# Patient Record
Sex: Female | Born: 1955
Health system: Southern US, Community
[De-identification: ages and names within clinical notes are randomized; demographics above are authoritative.]

## PROBLEM LIST (undated history)

## (undated) DIAGNOSIS — R740 Nonspecific elevation of levels of transaminase and lactic acid dehydrogenase [LDH]: Secondary | ICD-10-CM

## (undated) DIAGNOSIS — R011 Cardiac murmur, unspecified: Secondary | ICD-10-CM

## (undated) DIAGNOSIS — M51369 Other intervertebral disc degeneration, lumbar region without mention of lumbar back pain or lower extremity pain: Secondary | ICD-10-CM

## (undated) DIAGNOSIS — M5136 Other intervertebral disc degeneration, lumbar region: Secondary | ICD-10-CM

## (undated) DIAGNOSIS — J309 Allergic rhinitis, unspecified: Secondary | ICD-10-CM

## (undated) DIAGNOSIS — H409 Unspecified glaucoma: Secondary | ICD-10-CM

## (undated) DIAGNOSIS — G8929 Other chronic pain: Secondary | ICD-10-CM

## (undated) DIAGNOSIS — E119 Type 2 diabetes mellitus without complications: Secondary | ICD-10-CM

## (undated) DIAGNOSIS — K219 Gastro-esophageal reflux disease without esophagitis: Secondary | ICD-10-CM

## (undated) DIAGNOSIS — T7840XA Allergy, unspecified, initial encounter: Secondary | ICD-10-CM

## (undated) DIAGNOSIS — F71 Moderate intellectual disabilities: Secondary | ICD-10-CM

## (undated) DIAGNOSIS — R7401 Elevation of levels of liver transaminase levels: Secondary | ICD-10-CM

## (undated) DIAGNOSIS — K589 Irritable bowel syndrome without diarrhea: Secondary | ICD-10-CM

## (undated) DIAGNOSIS — D649 Anemia, unspecified: Secondary | ICD-10-CM

## (undated) DIAGNOSIS — M19079 Primary osteoarthritis, unspecified ankle and foot: Secondary | ICD-10-CM

## (undated) DIAGNOSIS — M545 Low back pain, unspecified: Secondary | ICD-10-CM

## (undated) DIAGNOSIS — D320 Benign neoplasm of cerebral meninges: Secondary | ICD-10-CM

## (undated) DIAGNOSIS — E669 Obesity, unspecified: Secondary | ICD-10-CM

## (undated) DIAGNOSIS — D869 Sarcoidosis, unspecified: Secondary | ICD-10-CM

## (undated) DIAGNOSIS — Z8781 Personal history of (healed) traumatic fracture: Secondary | ICD-10-CM

## (undated) DIAGNOSIS — I10 Essential (primary) hypertension: Secondary | ICD-10-CM

## (undated) DIAGNOSIS — D329 Benign neoplasm of meninges, unspecified: Secondary | ICD-10-CM

## (undated) DIAGNOSIS — E785 Hyperlipidemia, unspecified: Secondary | ICD-10-CM

## (undated) HISTORY — DX: Unspecified glaucoma: H40.9

## (undated) HISTORY — DX: Anemia, unspecified: D64.9

## (undated) HISTORY — DX: Sarcoidosis, unspecified: D86.9

## (undated) HISTORY — DX: Cardiac murmur, unspecified: R01.1

## (undated) HISTORY — DX: Allergy, unspecified, initial encounter: T78.40XA

## (undated) HISTORY — PX: CATARACT EXTRACTION: SUR2

## (undated) HISTORY — DX: Elevation of levels of liver transaminase levels: R74.01

## (undated) HISTORY — DX: Moderate intellectual disabilities: F71

## (undated) HISTORY — DX: Essential (primary) hypertension: I10

## (undated) HISTORY — DX: Other intervertebral disc degeneration, lumbar region without mention of lumbar back pain or lower extremity pain: M51.369

## (undated) HISTORY — DX: Other chronic pain: G89.29

## (undated) HISTORY — DX: Other intervertebral disc degeneration, lumbar region: M51.36

## (undated) HISTORY — DX: Low back pain: M54.5

## (undated) HISTORY — DX: Allergic rhinitis, unspecified: J30.9

## (undated) HISTORY — PX: ELBOW SURGERY: SHX618

## (undated) HISTORY — DX: Nonspecific elevation of levels of transaminase and lactic acid dehydrogenase (ldh): R74.0

## (undated) HISTORY — DX: Benign neoplasm of meninges, unspecified: D32.9

## (undated) HISTORY — DX: Low back pain, unspecified: M54.50

## (undated) HISTORY — DX: Obesity, unspecified: E66.9

## (undated) HISTORY — DX: Primary osteoarthritis, unspecified ankle and foot: M19.079

## (undated) HISTORY — DX: Hyperlipidemia, unspecified: E78.5

## (undated) HISTORY — DX: Benign neoplasm of cerebral meninges: D32.0

## (undated) HISTORY — DX: Personal history of (healed) traumatic fracture: Z87.81

## (undated) HISTORY — DX: Gastro-esophageal reflux disease without esophagitis: K21.9

## (undated) HISTORY — DX: Irritable bowel syndrome, unspecified: K58.9

## (undated) HISTORY — DX: Type 2 diabetes mellitus without complications: E11.9

---

## 1992-03-05 HISTORY — PX: TOTAL ABDOMINAL HYSTERECTOMY: SHX209

## 1997-07-24 ENCOUNTER — Emergency Department (HOSPITAL_COMMUNITY): Admission: EM | Admit: 1997-07-24 | Discharge: 1997-07-24 | Payer: Self-pay | Admitting: Emergency Medicine

## 1997-08-15 ENCOUNTER — Emergency Department (HOSPITAL_COMMUNITY): Admission: EM | Admit: 1997-08-15 | Discharge: 1997-08-15 | Payer: Self-pay | Admitting: Emergency Medicine

## 1997-09-09 ENCOUNTER — Emergency Department (HOSPITAL_COMMUNITY): Admission: EM | Admit: 1997-09-09 | Discharge: 1997-09-09 | Payer: Self-pay | Admitting: Emergency Medicine

## 1998-10-07 ENCOUNTER — Emergency Department (HOSPITAL_COMMUNITY): Admission: EM | Admit: 1998-10-07 | Discharge: 1998-10-07 | Payer: Self-pay | Admitting: *Deleted

## 1998-11-22 ENCOUNTER — Other Ambulatory Visit: Admission: RE | Admit: 1998-11-22 | Discharge: 1998-11-22 | Payer: Self-pay | Admitting: Family Medicine

## 1998-12-25 ENCOUNTER — Emergency Department (HOSPITAL_COMMUNITY): Admission: EM | Admit: 1998-12-25 | Discharge: 1998-12-25 | Payer: Self-pay | Admitting: Emergency Medicine

## 1999-03-28 ENCOUNTER — Emergency Department (HOSPITAL_COMMUNITY): Admission: EM | Admit: 1999-03-28 | Discharge: 1999-03-28 | Payer: Self-pay | Admitting: Emergency Medicine

## 1999-06-20 ENCOUNTER — Emergency Department (HOSPITAL_COMMUNITY): Admission: EM | Admit: 1999-06-20 | Discharge: 1999-06-20 | Payer: Self-pay | Admitting: Emergency Medicine

## 1999-07-21 ENCOUNTER — Emergency Department (HOSPITAL_COMMUNITY): Admission: EM | Admit: 1999-07-21 | Discharge: 1999-07-21 | Payer: Self-pay | Admitting: Emergency Medicine

## 1999-08-07 ENCOUNTER — Emergency Department (HOSPITAL_COMMUNITY): Admission: EM | Admit: 1999-08-07 | Discharge: 1999-08-07 | Payer: Self-pay | Admitting: Emergency Medicine

## 1999-10-13 ENCOUNTER — Encounter: Payer: Self-pay | Admitting: Emergency Medicine

## 1999-10-13 ENCOUNTER — Emergency Department (HOSPITAL_COMMUNITY): Admission: EM | Admit: 1999-10-13 | Discharge: 1999-10-13 | Payer: Self-pay | Admitting: Emergency Medicine

## 1999-11-23 ENCOUNTER — Emergency Department (HOSPITAL_COMMUNITY): Admission: EM | Admit: 1999-11-23 | Discharge: 1999-11-24 | Payer: Self-pay | Admitting: Emergency Medicine

## 2000-03-08 ENCOUNTER — Emergency Department (HOSPITAL_COMMUNITY): Admission: EM | Admit: 2000-03-08 | Discharge: 2000-03-08 | Payer: Self-pay | Admitting: Emergency Medicine

## 2000-08-17 ENCOUNTER — Emergency Department (HOSPITAL_COMMUNITY): Admission: EM | Admit: 2000-08-17 | Discharge: 2000-08-17 | Payer: Self-pay | Admitting: Emergency Medicine

## 2000-12-29 ENCOUNTER — Other Ambulatory Visit: Admission: RE | Admit: 2000-12-29 | Discharge: 2000-12-29 | Payer: Self-pay | Admitting: Obstetrics and Gynecology

## 2001-03-16 ENCOUNTER — Encounter: Admission: RE | Admit: 2001-03-16 | Discharge: 2001-03-16 | Payer: Self-pay | Admitting: Family Medicine

## 2001-03-16 ENCOUNTER — Encounter: Payer: Self-pay | Admitting: Family Medicine

## 2001-03-24 ENCOUNTER — Inpatient Hospital Stay (HOSPITAL_COMMUNITY): Admission: RE | Admit: 2001-03-24 | Discharge: 2001-03-25 | Payer: Self-pay | Admitting: Orthopaedic Surgery

## 2001-04-01 ENCOUNTER — Encounter: Admission: RE | Admit: 2001-04-01 | Discharge: 2001-04-01 | Payer: Self-pay | Admitting: Orthopaedic Surgery

## 2001-06-27 ENCOUNTER — Emergency Department (HOSPITAL_COMMUNITY): Admission: EM | Admit: 2001-06-27 | Discharge: 2001-06-27 | Payer: Self-pay | Admitting: Emergency Medicine

## 2001-07-10 ENCOUNTER — Emergency Department (HOSPITAL_COMMUNITY): Admission: EM | Admit: 2001-07-10 | Discharge: 2001-07-10 | Payer: Self-pay | Admitting: Emergency Medicine

## 2001-07-17 ENCOUNTER — Emergency Department (HOSPITAL_COMMUNITY): Admission: EM | Admit: 2001-07-17 | Discharge: 2001-07-17 | Payer: Self-pay | Admitting: *Deleted

## 2001-07-23 ENCOUNTER — Emergency Department (HOSPITAL_COMMUNITY): Admission: EM | Admit: 2001-07-23 | Discharge: 2001-07-23 | Payer: Self-pay | Admitting: Emergency Medicine

## 2001-07-23 ENCOUNTER — Encounter: Payer: Self-pay | Admitting: Emergency Medicine

## 2001-09-10 ENCOUNTER — Encounter: Admission: RE | Admit: 2001-09-10 | Discharge: 2001-09-10 | Payer: Self-pay | Admitting: Critical Care Medicine

## 2001-09-10 ENCOUNTER — Encounter: Payer: Self-pay | Admitting: Critical Care Medicine

## 2001-09-15 ENCOUNTER — Emergency Department (HOSPITAL_COMMUNITY): Admission: EM | Admit: 2001-09-15 | Discharge: 2001-09-15 | Payer: Self-pay | Admitting: Emergency Medicine

## 2001-09-16 ENCOUNTER — Emergency Department (HOSPITAL_COMMUNITY): Admission: EM | Admit: 2001-09-16 | Discharge: 2001-09-16 | Payer: Self-pay | Admitting: Emergency Medicine

## 2001-09-17 ENCOUNTER — Emergency Department (HOSPITAL_COMMUNITY): Admission: EM | Admit: 2001-09-17 | Discharge: 2001-09-17 | Payer: Self-pay | Admitting: *Deleted

## 2001-09-18 ENCOUNTER — Emergency Department (HOSPITAL_COMMUNITY): Admission: EM | Admit: 2001-09-18 | Discharge: 2001-09-18 | Payer: Self-pay | Admitting: Emergency Medicine

## 2001-11-11 ENCOUNTER — Emergency Department (HOSPITAL_COMMUNITY): Admission: EM | Admit: 2001-11-11 | Discharge: 2001-11-11 | Payer: Self-pay | Admitting: Emergency Medicine

## 2001-11-23 ENCOUNTER — Emergency Department (HOSPITAL_COMMUNITY): Admission: EM | Admit: 2001-11-23 | Discharge: 2001-11-23 | Payer: Self-pay | Admitting: Emergency Medicine

## 2001-12-20 DIAGNOSIS — D320 Benign neoplasm of cerebral meninges: Secondary | ICD-10-CM

## 2001-12-20 HISTORY — DX: Benign neoplasm of cerebral meninges: D32.0

## 2001-12-27 ENCOUNTER — Emergency Department (HOSPITAL_COMMUNITY): Admission: EM | Admit: 2001-12-27 | Discharge: 2001-12-27 | Payer: Self-pay | Admitting: Emergency Medicine

## 2001-12-27 ENCOUNTER — Encounter: Payer: Self-pay | Admitting: Emergency Medicine

## 2002-02-23 ENCOUNTER — Emergency Department (HOSPITAL_COMMUNITY): Admission: EM | Admit: 2002-02-23 | Discharge: 2002-02-24 | Payer: Self-pay | Admitting: Emergency Medicine

## 2002-02-24 ENCOUNTER — Emergency Department (HOSPITAL_COMMUNITY): Admission: EM | Admit: 2002-02-24 | Discharge: 2002-02-25 | Payer: Self-pay | Admitting: Emergency Medicine

## 2002-02-25 ENCOUNTER — Encounter: Payer: Self-pay | Admitting: Emergency Medicine

## 2002-03-10 ENCOUNTER — Other Ambulatory Visit: Admission: RE | Admit: 2002-03-10 | Discharge: 2002-03-10 | Payer: Self-pay | Admitting: Family Medicine

## 2002-05-01 ENCOUNTER — Emergency Department (HOSPITAL_COMMUNITY): Admission: EM | Admit: 2002-05-01 | Discharge: 2002-05-01 | Payer: Self-pay | Admitting: Emergency Medicine

## 2002-05-06 ENCOUNTER — Ambulatory Visit (HOSPITAL_COMMUNITY): Admission: RE | Admit: 2002-05-06 | Discharge: 2002-05-06 | Payer: Self-pay | Admitting: Family Medicine

## 2002-05-27 ENCOUNTER — Emergency Department (HOSPITAL_COMMUNITY): Admission: EM | Admit: 2002-05-27 | Discharge: 2002-05-27 | Payer: Self-pay | Admitting: Emergency Medicine

## 2002-06-21 ENCOUNTER — Emergency Department (HOSPITAL_COMMUNITY): Admission: EM | Admit: 2002-06-21 | Discharge: 2002-06-21 | Payer: Self-pay | Admitting: *Deleted

## 2002-07-02 ENCOUNTER — Emergency Department (HOSPITAL_COMMUNITY): Admission: EM | Admit: 2002-07-02 | Discharge: 2002-07-02 | Payer: Self-pay | Admitting: Emergency Medicine

## 2002-07-04 ENCOUNTER — Emergency Department (HOSPITAL_COMMUNITY): Admission: EM | Admit: 2002-07-04 | Discharge: 2002-07-04 | Payer: Self-pay | Admitting: Emergency Medicine

## 2002-07-15 ENCOUNTER — Emergency Department (HOSPITAL_COMMUNITY): Admission: EM | Admit: 2002-07-15 | Discharge: 2002-07-16 | Payer: Self-pay | Admitting: Emergency Medicine

## 2002-08-21 ENCOUNTER — Emergency Department (HOSPITAL_COMMUNITY): Admission: EM | Admit: 2002-08-21 | Discharge: 2002-08-21 | Payer: Self-pay | Admitting: Emergency Medicine

## 2002-08-31 ENCOUNTER — Encounter: Payer: Self-pay | Admitting: Family Medicine

## 2002-08-31 ENCOUNTER — Encounter: Admission: RE | Admit: 2002-08-31 | Discharge: 2002-08-31 | Payer: Self-pay | Admitting: Family Medicine

## 2002-09-03 ENCOUNTER — Emergency Department (HOSPITAL_COMMUNITY): Admission: EM | Admit: 2002-09-03 | Discharge: 2002-09-03 | Payer: Self-pay | Admitting: Emergency Medicine

## 2002-12-17 ENCOUNTER — Emergency Department (HOSPITAL_COMMUNITY): Admission: AD | Admit: 2002-12-17 | Discharge: 2002-12-17 | Payer: Self-pay | Admitting: Family Medicine

## 2002-12-25 ENCOUNTER — Emergency Department (HOSPITAL_COMMUNITY): Admission: EM | Admit: 2002-12-25 | Discharge: 2002-12-25 | Payer: Self-pay | Admitting: Emergency Medicine

## 2003-03-08 ENCOUNTER — Emergency Department (HOSPITAL_COMMUNITY): Admission: AD | Admit: 2003-03-08 | Discharge: 2003-03-08 | Payer: Self-pay | Admitting: Family Medicine

## 2004-01-04 ENCOUNTER — Ambulatory Visit: Payer: Self-pay | Admitting: Family Medicine

## 2004-01-22 ENCOUNTER — Emergency Department (HOSPITAL_COMMUNITY): Admission: EM | Admit: 2004-01-22 | Discharge: 2004-01-22 | Payer: Self-pay | Admitting: Emergency Medicine

## 2004-04-20 ENCOUNTER — Encounter: Payer: Self-pay | Admitting: Family Medicine

## 2004-05-01 ENCOUNTER — Ambulatory Visit: Payer: Self-pay | Admitting: Family Medicine

## 2004-05-06 ENCOUNTER — Other Ambulatory Visit: Admission: RE | Admit: 2004-05-06 | Discharge: 2004-05-06 | Payer: Self-pay | Admitting: Family Medicine

## 2004-05-06 ENCOUNTER — Ambulatory Visit: Payer: Self-pay | Admitting: Family Medicine

## 2004-05-30 ENCOUNTER — Ambulatory Visit: Payer: Self-pay | Admitting: Family Medicine

## 2004-06-28 ENCOUNTER — Ambulatory Visit: Payer: Self-pay | Admitting: Family Medicine

## 2004-08-06 ENCOUNTER — Ambulatory Visit: Payer: Self-pay | Admitting: Family Medicine

## 2004-09-16 ENCOUNTER — Ambulatory Visit: Payer: Self-pay | Admitting: Family Medicine

## 2004-10-02 ENCOUNTER — Emergency Department (HOSPITAL_COMMUNITY): Admission: EM | Admit: 2004-10-02 | Discharge: 2004-10-02 | Payer: Self-pay | Admitting: Emergency Medicine

## 2004-10-15 ENCOUNTER — Ambulatory Visit: Payer: Self-pay | Admitting: Family Medicine

## 2004-10-24 ENCOUNTER — Ambulatory Visit: Payer: Self-pay | Admitting: Family Medicine

## 2004-11-06 ENCOUNTER — Ambulatory Visit: Payer: Self-pay | Admitting: Family Medicine

## 2004-11-12 ENCOUNTER — Ambulatory Visit: Payer: Self-pay | Admitting: Family Medicine

## 2004-11-23 ENCOUNTER — Emergency Department (HOSPITAL_COMMUNITY): Admission: EM | Admit: 2004-11-23 | Discharge: 2004-11-23 | Payer: Self-pay | Admitting: Emergency Medicine

## 2004-11-25 ENCOUNTER — Ambulatory Visit: Payer: Self-pay | Admitting: Family Medicine

## 2004-11-30 ENCOUNTER — Emergency Department (HOSPITAL_COMMUNITY): Admission: EM | Admit: 2004-11-30 | Discharge: 2004-11-30 | Payer: Self-pay | Admitting: Emergency Medicine

## 2004-12-04 ENCOUNTER — Ambulatory Visit: Payer: Self-pay | Admitting: Family Medicine

## 2004-12-21 ENCOUNTER — Emergency Department (HOSPITAL_COMMUNITY): Admission: EM | Admit: 2004-12-21 | Discharge: 2004-12-21 | Payer: Self-pay | Admitting: Emergency Medicine

## 2004-12-24 ENCOUNTER — Ambulatory Visit: Payer: Self-pay | Admitting: Family Medicine

## 2005-01-28 ENCOUNTER — Ambulatory Visit: Payer: Self-pay | Admitting: Family Medicine

## 2005-02-18 ENCOUNTER — Ambulatory Visit: Payer: Self-pay | Admitting: Family Medicine

## 2005-03-03 ENCOUNTER — Ambulatory Visit: Payer: Self-pay | Admitting: Family Medicine

## 2005-03-13 ENCOUNTER — Emergency Department (HOSPITAL_COMMUNITY): Admission: EM | Admit: 2005-03-13 | Discharge: 2005-03-13 | Payer: Self-pay | Admitting: Emergency Medicine

## 2005-04-11 ENCOUNTER — Ambulatory Visit: Payer: Self-pay | Admitting: Family Medicine

## 2005-04-14 ENCOUNTER — Emergency Department (HOSPITAL_COMMUNITY): Admission: EM | Admit: 2005-04-14 | Discharge: 2005-04-14 | Payer: Self-pay | Admitting: Emergency Medicine

## 2005-04-28 ENCOUNTER — Emergency Department (HOSPITAL_COMMUNITY): Admission: EM | Admit: 2005-04-28 | Discharge: 2005-04-28 | Payer: Self-pay | Admitting: Emergency Medicine

## 2005-04-29 ENCOUNTER — Ambulatory Visit: Payer: Self-pay | Admitting: Family Medicine

## 2005-05-12 ENCOUNTER — Ambulatory Visit: Payer: Self-pay | Admitting: Family Medicine

## 2005-06-24 ENCOUNTER — Ambulatory Visit: Payer: Self-pay | Admitting: Family Medicine

## 2005-07-03 ENCOUNTER — Ambulatory Visit: Payer: Self-pay | Admitting: Family Medicine

## 2005-07-25 ENCOUNTER — Ambulatory Visit: Payer: Self-pay | Admitting: Family Medicine

## 2005-08-04 ENCOUNTER — Emergency Department (HOSPITAL_COMMUNITY): Admission: EM | Admit: 2005-08-04 | Discharge: 2005-08-04 | Payer: Self-pay | Admitting: Emergency Medicine

## 2005-08-08 ENCOUNTER — Ambulatory Visit: Payer: Self-pay | Admitting: Family Medicine

## 2005-08-14 ENCOUNTER — Ambulatory Visit: Payer: Self-pay | Admitting: Family Medicine

## 2005-09-13 ENCOUNTER — Emergency Department (HOSPITAL_COMMUNITY): Admission: EM | Admit: 2005-09-13 | Discharge: 2005-09-13 | Payer: Self-pay

## 2005-09-13 ENCOUNTER — Emergency Department (HOSPITAL_COMMUNITY): Admission: EM | Admit: 2005-09-13 | Discharge: 2005-09-13 | Payer: Self-pay | Admitting: Emergency Medicine

## 2005-09-15 ENCOUNTER — Ambulatory Visit: Payer: Self-pay | Admitting: Family Medicine

## 2005-09-17 ENCOUNTER — Ambulatory Visit: Payer: Self-pay | Admitting: Family Medicine

## 2005-09-29 ENCOUNTER — Ambulatory Visit: Payer: Self-pay | Admitting: Family Medicine

## 2005-10-01 ENCOUNTER — Ambulatory Visit: Payer: Self-pay | Admitting: Family Medicine

## 2005-10-04 ENCOUNTER — Emergency Department (HOSPITAL_COMMUNITY): Admission: EM | Admit: 2005-10-04 | Discharge: 2005-10-04 | Payer: Self-pay | Admitting: Emergency Medicine

## 2005-10-09 ENCOUNTER — Ambulatory Visit: Payer: Self-pay | Admitting: Family Medicine

## 2005-10-10 ENCOUNTER — Emergency Department (HOSPITAL_COMMUNITY): Admission: EM | Admit: 2005-10-10 | Discharge: 2005-10-10 | Payer: Self-pay | Admitting: Emergency Medicine

## 2005-10-28 ENCOUNTER — Ambulatory Visit: Payer: Self-pay | Admitting: Family Medicine

## 2005-11-11 ENCOUNTER — Ambulatory Visit: Payer: Self-pay | Admitting: Family Medicine

## 2005-11-13 ENCOUNTER — Ambulatory Visit: Payer: Self-pay | Admitting: Family Medicine

## 2005-11-18 ENCOUNTER — Ambulatory Visit: Payer: Self-pay | Admitting: Family Medicine

## 2005-12-03 ENCOUNTER — Ambulatory Visit: Payer: Self-pay | Admitting: Family Medicine

## 2005-12-16 ENCOUNTER — Emergency Department (HOSPITAL_COMMUNITY): Admission: EM | Admit: 2005-12-16 | Discharge: 2005-12-16 | Payer: Self-pay | Admitting: Emergency Medicine

## 2005-12-17 ENCOUNTER — Ambulatory Visit: Payer: Self-pay | Admitting: Family Medicine

## 2005-12-19 ENCOUNTER — Ambulatory Visit: Payer: Self-pay | Admitting: Family Medicine

## 2005-12-22 ENCOUNTER — Ambulatory Visit: Payer: Self-pay | Admitting: Family Medicine

## 2006-01-21 ENCOUNTER — Emergency Department (HOSPITAL_COMMUNITY): Admission: EM | Admit: 2006-01-21 | Discharge: 2006-01-21 | Payer: Self-pay | Admitting: Emergency Medicine

## 2006-02-07 ENCOUNTER — Emergency Department (HOSPITAL_COMMUNITY): Admission: EM | Admit: 2006-02-07 | Discharge: 2006-02-07 | Payer: Self-pay | Admitting: Emergency Medicine

## 2006-03-02 ENCOUNTER — Ambulatory Visit: Payer: Self-pay | Admitting: Family Medicine

## 2006-03-10 ENCOUNTER — Ambulatory Visit: Payer: Self-pay | Admitting: Family Medicine

## 2006-03-11 ENCOUNTER — Encounter: Payer: Self-pay | Admitting: Family Medicine

## 2006-03-11 DIAGNOSIS — K219 Gastro-esophageal reflux disease without esophagitis: Secondary | ICD-10-CM

## 2006-03-11 DIAGNOSIS — H409 Unspecified glaucoma: Secondary | ICD-10-CM

## 2006-03-11 DIAGNOSIS — F71 Moderate intellectual disabilities: Secondary | ICD-10-CM | POA: Insufficient documentation

## 2006-03-11 HISTORY — DX: Unspecified glaucoma: H40.9

## 2006-03-12 ENCOUNTER — Emergency Department (HOSPITAL_COMMUNITY): Admission: EM | Admit: 2006-03-12 | Discharge: 2006-03-12 | Payer: Self-pay | Admitting: Emergency Medicine

## 2006-03-13 ENCOUNTER — Ambulatory Visit: Payer: Self-pay | Admitting: Family Medicine

## 2006-03-13 LAB — CONVERTED CEMR LAB
ALT: 33 units/L (ref 0–40)
AST: 29 units/L (ref 0–37)
Albumin: 3.4 g/dL — ABNORMAL LOW (ref 3.5–5.2)
Alkaline Phosphatase: 142 units/L — ABNORMAL HIGH (ref 39–117)
Eosinophils Relative: 0.4 % (ref 0.0–5.0)
HCT: 46.3 % — ABNORMAL HIGH (ref 36.0–46.0)
Hemoglobin: 15 g/dL (ref 12.0–15.0)
Hgb A1c MFr Bld: 6.4 % — ABNORMAL HIGH (ref 4.6–6.0)
LDL Cholesterol: 70 mg/dL (ref 0–99)
Lymphocytes Relative: 21.9 % (ref 12.0–46.0)
MCHC: 32.4 g/dL (ref 30.0–36.0)
MCV: 85.7 fL (ref 78.0–100.0)
Monocytes Absolute: 0.6 10*3/uL (ref 0.2–0.7)
Monocytes Relative: 12.3 % — ABNORMAL HIGH (ref 3.0–11.0)
Neutro Abs: 3.2 10*3/uL (ref 1.4–7.7)
Neutrophils Relative %: 65.2 % (ref 43.0–77.0)
Platelets: 283 10*3/uL (ref 150–400)
RBC: 5.41 M/uL — ABNORMAL HIGH (ref 3.87–5.11)
RDW: 13 % (ref 11.5–14.6)
Total CHOL/HDL Ratio: 4.1
Total Protein: 6.7 g/dL (ref 6.0–8.3)
VLDL: 28 mg/dL (ref 0–40)

## 2006-03-16 ENCOUNTER — Ambulatory Visit: Payer: Self-pay | Admitting: Cardiology

## 2006-03-27 ENCOUNTER — Ambulatory Visit: Payer: Self-pay | Admitting: Family Medicine

## 2006-05-13 ENCOUNTER — Ambulatory Visit: Payer: Self-pay | Admitting: Family Medicine

## 2006-05-14 ENCOUNTER — Ambulatory Visit: Payer: Self-pay | Admitting: Family Medicine

## 2006-06-13 ENCOUNTER — Emergency Department (HOSPITAL_COMMUNITY): Admission: EM | Admit: 2006-06-13 | Discharge: 2006-06-13 | Payer: Self-pay | Admitting: Emergency Medicine

## 2006-06-29 ENCOUNTER — Ambulatory Visit: Payer: Self-pay | Admitting: Family Medicine

## 2006-07-01 LAB — CONVERTED CEMR LAB
Albumin: 3.3 g/dL — ABNORMAL LOW (ref 3.5–5.2)
BUN: 6 mg/dL (ref 6–23)
Calcium: 8.7 mg/dL (ref 8.4–10.5)
Total CHOL/HDL Ratio: 4.2
Triglycerides: 147 mg/dL (ref 0–149)

## 2006-08-07 ENCOUNTER — Ambulatory Visit: Payer: Self-pay | Admitting: Family Medicine

## 2006-08-13 ENCOUNTER — Emergency Department (HOSPITAL_COMMUNITY): Admission: EM | Admit: 2006-08-13 | Discharge: 2006-08-13 | Payer: Self-pay | Admitting: Emergency Medicine

## 2006-08-26 ENCOUNTER — Ambulatory Visit: Payer: Self-pay | Admitting: Family Medicine

## 2006-08-27 ENCOUNTER — Emergency Department (HOSPITAL_COMMUNITY): Admission: EM | Admit: 2006-08-27 | Discharge: 2006-08-27 | Payer: Self-pay | Admitting: Emergency Medicine

## 2006-08-31 LAB — CONVERTED CEMR LAB: Hgb A1c MFr Bld: 6.2 % — ABNORMAL HIGH (ref 4.6–6.0)

## 2006-09-09 ENCOUNTER — Encounter: Admission: RE | Admit: 2006-09-09 | Discharge: 2006-09-09 | Payer: Self-pay | Admitting: Family Medicine

## 2006-09-09 ENCOUNTER — Ambulatory Visit: Payer: Self-pay | Admitting: Family Medicine

## 2006-09-23 ENCOUNTER — Ambulatory Visit: Payer: Self-pay | Admitting: Internal Medicine

## 2006-09-23 DIAGNOSIS — J309 Allergic rhinitis, unspecified: Secondary | ICD-10-CM

## 2006-10-07 ENCOUNTER — Ambulatory Visit: Payer: Self-pay | Admitting: Family Medicine

## 2006-10-21 ENCOUNTER — Ambulatory Visit: Payer: Self-pay | Admitting: Family Medicine

## 2006-11-09 ENCOUNTER — Ambulatory Visit: Payer: Self-pay | Admitting: Family Medicine

## 2006-11-17 ENCOUNTER — Emergency Department (HOSPITAL_COMMUNITY): Admission: EM | Admit: 2006-11-17 | Discharge: 2006-11-17 | Payer: Self-pay | Admitting: Emergency Medicine

## 2006-11-18 ENCOUNTER — Ambulatory Visit: Payer: Self-pay | Admitting: Family Medicine

## 2006-11-27 ENCOUNTER — Telehealth: Payer: Self-pay | Admitting: Family Medicine

## 2006-12-25 ENCOUNTER — Emergency Department (HOSPITAL_COMMUNITY): Admission: EM | Admit: 2006-12-25 | Discharge: 2006-12-25 | Payer: Self-pay | Admitting: Emergency Medicine

## 2007-01-01 ENCOUNTER — Ambulatory Visit: Payer: Self-pay | Admitting: Family Medicine

## 2007-02-25 ENCOUNTER — Ambulatory Visit: Payer: Self-pay | Admitting: Family Medicine

## 2007-03-12 ENCOUNTER — Encounter: Payer: Self-pay | Admitting: Family Medicine

## 2007-03-19 ENCOUNTER — Encounter (INDEPENDENT_AMBULATORY_CARE_PROVIDER_SITE_OTHER): Payer: Self-pay | Admitting: *Deleted

## 2007-04-25 ENCOUNTER — Emergency Department (HOSPITAL_COMMUNITY): Admission: EM | Admit: 2007-04-25 | Discharge: 2007-04-25 | Payer: Self-pay | Admitting: Emergency Medicine

## 2007-04-26 ENCOUNTER — Emergency Department (HOSPITAL_COMMUNITY): Admission: EM | Admit: 2007-04-26 | Discharge: 2007-04-26 | Payer: Self-pay | Admitting: Emergency Medicine

## 2007-04-27 ENCOUNTER — Ambulatory Visit: Payer: Self-pay | Admitting: Family Medicine

## 2007-05-10 ENCOUNTER — Ambulatory Visit: Payer: Self-pay | Admitting: Family Medicine

## 2007-06-22 ENCOUNTER — Ambulatory Visit: Payer: Self-pay | Admitting: Family Medicine

## 2007-06-24 ENCOUNTER — Ambulatory Visit: Payer: Self-pay | Admitting: Family Medicine

## 2007-06-25 LAB — CONVERTED CEMR LAB
ALT: 54 units/L — ABNORMAL HIGH (ref 0–35)
AST: 45 units/L — ABNORMAL HIGH (ref 0–37)
Albumin: 3.4 g/dL — ABNORMAL LOW (ref 3.5–5.2)
BUN: 9 mg/dL (ref 6–23)
Cholesterol: 169 mg/dL (ref 0–200)
Creatinine, Ser: 0.7 mg/dL (ref 0.4–1.2)
GFR calc Af Amer: 113 mL/min
GFR calc non Af Amer: 93 mL/min
Glucose, Bld: 122 mg/dL — ABNORMAL HIGH (ref 70–99)
Hgb A1c MFr Bld: 6.8 % — ABNORMAL HIGH (ref 4.6–6.0)
LDL Cholesterol: 108 mg/dL — ABNORMAL HIGH (ref 0–99)
Potassium: 3.9 meq/L (ref 3.5–5.1)
Sodium: 143 meq/L (ref 135–145)
Total CHOL/HDL Ratio: 6.1

## 2007-07-26 ENCOUNTER — Emergency Department (HOSPITAL_COMMUNITY): Admission: EM | Admit: 2007-07-26 | Discharge: 2007-07-26 | Payer: Self-pay | Admitting: Emergency Medicine

## 2007-08-05 ENCOUNTER — Emergency Department (HOSPITAL_COMMUNITY): Admission: EM | Admit: 2007-08-05 | Discharge: 2007-08-05 | Payer: Self-pay | Admitting: Emergency Medicine

## 2007-08-13 ENCOUNTER — Ambulatory Visit: Payer: Self-pay | Admitting: Family Medicine

## 2007-08-13 DIAGNOSIS — E119 Type 2 diabetes mellitus without complications: Secondary | ICD-10-CM | POA: Insufficient documentation

## 2007-08-16 ENCOUNTER — Encounter: Admission: RE | Admit: 2007-08-16 | Discharge: 2007-10-12 | Payer: Self-pay | Admitting: Family Medicine

## 2007-08-23 ENCOUNTER — Telehealth (INDEPENDENT_AMBULATORY_CARE_PROVIDER_SITE_OTHER): Payer: Self-pay | Admitting: *Deleted

## 2007-09-01 ENCOUNTER — Ambulatory Visit: Payer: Self-pay | Admitting: Family Medicine

## 2007-09-07 ENCOUNTER — Ambulatory Visit: Payer: Self-pay | Admitting: Family Medicine

## 2007-09-09 ENCOUNTER — Encounter: Payer: Self-pay | Admitting: Family Medicine

## 2007-09-15 ENCOUNTER — Encounter: Payer: Self-pay | Admitting: Family Medicine

## 2007-09-16 ENCOUNTER — Ambulatory Visit: Payer: Self-pay | Admitting: Family Medicine

## 2007-10-11 ENCOUNTER — Telehealth (INDEPENDENT_AMBULATORY_CARE_PROVIDER_SITE_OTHER): Payer: Self-pay | Admitting: *Deleted

## 2007-10-18 ENCOUNTER — Ambulatory Visit: Payer: Self-pay | Admitting: Family Medicine

## 2007-11-16 ENCOUNTER — Ambulatory Visit: Payer: Self-pay | Admitting: Family Medicine

## 2007-11-17 LAB — CONVERTED CEMR LAB
ALT: 55 units/L — ABNORMAL HIGH (ref 0–35)
Albumin: 3.5 g/dL (ref 3.5–5.2)
Alkaline Phosphatase: 182 units/L — ABNORMAL HIGH (ref 39–117)
Bilirubin, Direct: 0.1 mg/dL (ref 0.0–0.3)
Creatinine,U: 288.5 mg/dL
LDL Cholesterol: 95 mg/dL (ref 0–99)
Microalb, Ur: 6.3 mg/dL — ABNORMAL HIGH (ref 0.0–1.9)
VLDL: 28 mg/dL (ref 0–40)

## 2007-11-22 ENCOUNTER — Ambulatory Visit: Payer: Self-pay | Admitting: Family Medicine

## 2007-11-22 DIAGNOSIS — R740 Nonspecific elevation of levels of transaminase and lactic acid dehydrogenase [LDH]: Secondary | ICD-10-CM

## 2007-12-20 ENCOUNTER — Ambulatory Visit: Payer: Self-pay | Admitting: Family Medicine

## 2007-12-31 LAB — HM COLONOSCOPY

## 2008-01-31 ENCOUNTER — Encounter: Admission: RE | Admit: 2008-01-31 | Discharge: 2008-01-31 | Payer: Self-pay | Admitting: Family Medicine

## 2008-01-31 ENCOUNTER — Ambulatory Visit: Payer: Self-pay | Admitting: Family Medicine

## 2008-02-03 LAB — CONVERTED CEMR LAB
AST: 36 units/L (ref 0–37)
Albumin: 3.4 g/dL — ABNORMAL LOW (ref 3.5–5.2)
Bilirubin, Direct: 0.1 mg/dL (ref 0.0–0.3)
CK-MB: 0.9 ng/mL (ref 0.3–4.0)
CO2: 27 meq/L (ref 19–32)
Calcium: 9 mg/dL (ref 8.4–10.5)
Chloride: 106 meq/L (ref 96–112)
GFR calc non Af Amer: 111 mL/min
Glucose, Bld: 123 mg/dL — ABNORMAL HIGH (ref 70–99)
Hgb A1c MFr Bld: 6.8 % — ABNORMAL HIGH (ref 4.6–6.0)
Potassium: 4.1 meq/L (ref 3.5–5.1)
Sodium: 140 meq/L (ref 135–145)
Total CK: 50 units/L (ref 7–177)

## 2008-02-15 ENCOUNTER — Telehealth: Payer: Self-pay | Admitting: Family Medicine

## 2008-02-16 ENCOUNTER — Encounter: Payer: Self-pay | Admitting: Family Medicine

## 2008-03-13 ENCOUNTER — Encounter: Payer: Self-pay | Admitting: Family Medicine

## 2008-03-16 ENCOUNTER — Encounter: Payer: Self-pay | Admitting: Family Medicine

## 2008-03-20 ENCOUNTER — Ambulatory Visit: Payer: Self-pay | Admitting: Family Medicine

## 2008-03-21 LAB — CONVERTED CEMR LAB
Albumin: 3.6 g/dL (ref 3.5–5.2)
Bilirubin, Direct: 0.1 mg/dL (ref 0.0–0.3)
CO2: 30 meq/L (ref 19–32)
Chloride: 104 meq/L (ref 96–112)
Cholesterol: 173 mg/dL (ref 0–200)
GFR calc Af Amer: 113 mL/min
HDL: 31.7 mg/dL — ABNORMAL LOW (ref >39.0)
Total CHOL/HDL Ratio: 5.5
Total Protein: 7.3 g/dL (ref 6.0–8.3)

## 2008-03-23 ENCOUNTER — Ambulatory Visit: Payer: Self-pay | Admitting: Family Medicine

## 2008-03-31 ENCOUNTER — Ambulatory Visit: Payer: Self-pay | Admitting: Family Medicine

## 2008-04-05 ENCOUNTER — Ambulatory Visit: Payer: Self-pay | Admitting: Family Medicine

## 2008-04-11 ENCOUNTER — Encounter: Payer: Self-pay | Admitting: Family Medicine

## 2008-04-28 ENCOUNTER — Emergency Department (HOSPITAL_COMMUNITY): Admission: EM | Admit: 2008-04-28 | Discharge: 2008-04-28 | Payer: Self-pay | Admitting: Emergency Medicine

## 2008-05-13 ENCOUNTER — Encounter: Admission: RE | Admit: 2008-05-13 | Discharge: 2008-05-13 | Payer: Self-pay | Admitting: Orthopaedic Surgery

## 2008-05-18 ENCOUNTER — Ambulatory Visit: Payer: Self-pay | Admitting: Family Medicine

## 2008-06-08 ENCOUNTER — Ambulatory Visit: Payer: Self-pay | Admitting: Family Medicine

## 2008-06-12 ENCOUNTER — Telehealth: Payer: Self-pay | Admitting: Family Medicine

## 2008-06-14 ENCOUNTER — Telehealth: Payer: Self-pay | Admitting: Family Medicine

## 2008-06-15 ENCOUNTER — Ambulatory Visit: Payer: Self-pay | Admitting: Family Medicine

## 2008-06-29 ENCOUNTER — Ambulatory Visit: Payer: Self-pay | Admitting: Family Medicine

## 2008-06-30 ENCOUNTER — Ambulatory Visit: Payer: Self-pay | Admitting: Family Medicine

## 2008-07-02 LAB — CONVERTED CEMR LAB
ALT: 37 units/L — ABNORMAL HIGH (ref 0–35)
Bilirubin, Direct: 0 mg/dL (ref 0.0–0.3)
CO2: 29 meq/L (ref 19–32)
Calcium: 8.5 mg/dL (ref 8.4–10.5)
Chloride: 112 meq/L (ref 96–112)
Glucose, Bld: 114 mg/dL — ABNORMAL HIGH (ref 70–99)
Hgb A1c MFr Bld: 7.2 % — ABNORMAL HIGH (ref 4.6–6.5)
Phosphorus: 3.5 mg/dL (ref 2.3–4.6)
Potassium: 4 meq/L (ref 3.5–5.1)

## 2008-07-24 ENCOUNTER — Emergency Department (HOSPITAL_COMMUNITY): Admission: EM | Admit: 2008-07-24 | Discharge: 2008-07-24 | Payer: Self-pay | Admitting: Emergency Medicine

## 2008-08-07 ENCOUNTER — Ambulatory Visit: Payer: Self-pay | Admitting: Family Medicine

## 2008-08-30 ENCOUNTER — Telehealth: Payer: Self-pay | Admitting: Family Medicine

## 2008-09-02 ENCOUNTER — Emergency Department (HOSPITAL_COMMUNITY): Admission: EM | Admit: 2008-09-02 | Discharge: 2008-09-03 | Payer: Self-pay | Admitting: Emergency Medicine

## 2008-09-03 ENCOUNTER — Emergency Department (HOSPITAL_COMMUNITY): Admission: EM | Admit: 2008-09-03 | Discharge: 2008-09-03 | Payer: Self-pay | Admitting: Emergency Medicine

## 2008-09-13 ENCOUNTER — Emergency Department (HOSPITAL_COMMUNITY): Admission: EM | Admit: 2008-09-13 | Discharge: 2008-09-14 | Payer: Self-pay | Admitting: Emergency Medicine

## 2008-09-14 ENCOUNTER — Emergency Department (HOSPITAL_COMMUNITY): Admission: EM | Admit: 2008-09-14 | Discharge: 2008-09-14 | Payer: Self-pay | Admitting: Emergency Medicine

## 2008-09-15 ENCOUNTER — Ambulatory Visit: Payer: Self-pay | Admitting: Family Medicine

## 2008-10-05 ENCOUNTER — Ambulatory Visit: Payer: Self-pay | Admitting: Family Medicine

## 2008-10-09 ENCOUNTER — Emergency Department (HOSPITAL_COMMUNITY): Admission: EM | Admit: 2008-10-09 | Discharge: 2008-10-09 | Payer: Self-pay | Admitting: Emergency Medicine

## 2008-10-12 ENCOUNTER — Ambulatory Visit: Payer: Self-pay | Admitting: Family Medicine

## 2008-10-13 LAB — CONVERTED CEMR LAB
BUN: 10 mg/dL (ref 6–23)
CO2: 29 meq/L (ref 19–32)
Creatinine,U: 128.9 mg/dL
GFR calc non Af Amer: 92.61 mL/min (ref >60)
HDL: 32.6 mg/dL — ABNORMAL LOW (ref >39.00)
LDL Cholesterol: 88 mg/dL (ref 0–99)
Microalb, Ur: 0.9 mg/dL (ref 0.0–1.9)
Phosphorus: 3.3 mg/dL (ref 2.3–4.6)
Sodium: 143 meq/L (ref 135–145)

## 2008-10-19 ENCOUNTER — Ambulatory Visit: Payer: Self-pay | Admitting: Family Medicine

## 2008-11-01 ENCOUNTER — Telehealth: Payer: Self-pay | Admitting: Family Medicine

## 2008-11-01 ENCOUNTER — Ambulatory Visit: Payer: Self-pay | Admitting: Family Medicine

## 2008-11-14 ENCOUNTER — Encounter: Payer: Self-pay | Admitting: Family Medicine

## 2008-11-16 ENCOUNTER — Emergency Department (HOSPITAL_COMMUNITY): Admission: EM | Admit: 2008-11-16 | Discharge: 2008-11-16 | Payer: Self-pay | Admitting: Emergency Medicine

## 2008-12-01 ENCOUNTER — Ambulatory Visit: Payer: Self-pay | Admitting: Family Medicine

## 2008-12-08 ENCOUNTER — Ambulatory Visit: Payer: Self-pay | Admitting: Family Medicine

## 2008-12-15 ENCOUNTER — Emergency Department (HOSPITAL_COMMUNITY): Admission: EM | Admit: 2008-12-15 | Discharge: 2008-12-15 | Payer: Self-pay | Admitting: Emergency Medicine

## 2008-12-17 ENCOUNTER — Emergency Department (HOSPITAL_COMMUNITY): Admission: EM | Admit: 2008-12-17 | Discharge: 2008-12-17 | Payer: Self-pay | Admitting: Emergency Medicine

## 2008-12-20 ENCOUNTER — Emergency Department (HOSPITAL_COMMUNITY): Admission: EM | Admit: 2008-12-20 | Discharge: 2008-12-20 | Payer: Self-pay | Admitting: Emergency Medicine

## 2009-01-22 ENCOUNTER — Telehealth: Payer: Self-pay | Admitting: Family Medicine

## 2009-01-24 ENCOUNTER — Ambulatory Visit: Payer: Self-pay | Admitting: Family Medicine

## 2009-01-25 ENCOUNTER — Telehealth: Payer: Self-pay | Admitting: Family Medicine

## 2009-02-08 ENCOUNTER — Ambulatory Visit: Payer: Self-pay | Admitting: Family Medicine

## 2009-02-13 ENCOUNTER — Encounter (INDEPENDENT_AMBULATORY_CARE_PROVIDER_SITE_OTHER): Payer: Self-pay | Admitting: *Deleted

## 2009-02-13 LAB — CONVERTED CEMR LAB
ALT: 30 units/L (ref 0–35)
AST: 22 units/L (ref 0–37)
Albumin: 3.6 g/dL (ref 3.5–5.2)
Alkaline Phosphatase: 139 units/L — ABNORMAL HIGH (ref 39–117)
BUN: 12 mg/dL (ref 6–23)
Bilirubin, Direct: 0.1 mg/dL (ref 0.0–0.3)
Calcium: 9.2 mg/dL (ref 8.4–10.5)
Creatinine, Ser: 0.7 mg/dL (ref 0.4–1.2)
Glucose, Bld: 103 mg/dL — ABNORMAL HIGH (ref 70–99)
Hgb A1c MFr Bld: 7.1 % — ABNORMAL HIGH (ref 4.6–6.5)
Total Bilirubin: 0.7 mg/dL (ref 0.3–1.2)
Total CHOL/HDL Ratio: 4
Triglycerides: 145 mg/dL (ref 0.0–149.0)
VLDL: 29 mg/dL (ref 0.0–40.0)

## 2009-02-25 ENCOUNTER — Emergency Department (HOSPITAL_COMMUNITY): Admission: EM | Admit: 2009-02-25 | Discharge: 2009-02-25 | Payer: Self-pay | Admitting: Emergency Medicine

## 2009-03-06 ENCOUNTER — Emergency Department (HOSPITAL_COMMUNITY): Admission: EM | Admit: 2009-03-06 | Discharge: 2009-03-06 | Payer: Self-pay | Admitting: Emergency Medicine

## 2009-03-12 ENCOUNTER — Ambulatory Visit: Payer: Self-pay | Admitting: Family Medicine

## 2009-03-13 ENCOUNTER — Ambulatory Visit: Payer: Self-pay | Admitting: Family Medicine

## 2009-03-14 ENCOUNTER — Encounter: Payer: Self-pay | Admitting: Family Medicine

## 2009-03-21 ENCOUNTER — Telehealth: Payer: Self-pay | Admitting: Family Medicine

## 2009-03-22 ENCOUNTER — Encounter (INDEPENDENT_AMBULATORY_CARE_PROVIDER_SITE_OTHER): Payer: Self-pay | Admitting: *Deleted

## 2009-03-23 ENCOUNTER — Ambulatory Visit: Payer: Self-pay | Admitting: Family Medicine

## 2009-03-23 LAB — CONVERTED CEMR LAB
Glucose, Urine, Semiquant: NEGATIVE
Ketones, urine, test strip: NEGATIVE
Nitrite: NEGATIVE
Specific Gravity, Urine: 1.015
Urobilinogen, UA: 0.2

## 2009-03-28 ENCOUNTER — Emergency Department (HOSPITAL_COMMUNITY): Admission: EM | Admit: 2009-03-28 | Discharge: 2009-03-28 | Payer: Self-pay | Admitting: Emergency Medicine

## 2009-04-12 ENCOUNTER — Emergency Department (HOSPITAL_COMMUNITY): Admission: EM | Admit: 2009-04-12 | Discharge: 2009-04-12 | Payer: Self-pay | Admitting: Emergency Medicine

## 2009-04-13 ENCOUNTER — Ambulatory Visit: Payer: Self-pay | Admitting: Family Medicine

## 2009-05-07 ENCOUNTER — Telehealth: Payer: Self-pay | Admitting: Family Medicine

## 2009-05-08 ENCOUNTER — Ambulatory Visit: Payer: Self-pay | Admitting: Family Medicine

## 2009-05-15 ENCOUNTER — Ambulatory Visit: Payer: Self-pay | Admitting: Family Medicine

## 2009-05-16 LAB — CONVERTED CEMR LAB
ALT: 39 U/L — ABNORMAL HIGH
AST: 29 U/L
Cholesterol: 147 mg/dL
HDL: 29.6 mg/dL — ABNORMAL LOW
Hgb A1c MFr Bld: 7 % — ABNORMAL HIGH
LDL Cholesterol: 78 mg/dL
Total CHOL/HDL Ratio: 5
Triglycerides: 196 mg/dL — ABNORMAL HIGH
VLDL: 39.2 mg/dL

## 2009-05-18 ENCOUNTER — Telehealth: Payer: Self-pay | Admitting: Family Medicine

## 2009-05-21 ENCOUNTER — Ambulatory Visit: Payer: Self-pay | Admitting: Family Medicine

## 2009-05-24 ENCOUNTER — Emergency Department (HOSPITAL_COMMUNITY): Admission: EM | Admit: 2009-05-24 | Discharge: 2009-05-24 | Payer: Self-pay | Admitting: Emergency Medicine

## 2009-05-28 ENCOUNTER — Ambulatory Visit: Payer: Self-pay | Admitting: Family Medicine

## 2009-05-29 ENCOUNTER — Encounter: Payer: Self-pay | Admitting: Family Medicine

## 2009-05-29 LAB — CONVERTED CEMR LAB
BUN: 8 mg/dL (ref 6–23)
Calcium: 9.3 mg/dL (ref 8.4–10.5)
Creatinine, Ser: 0.6 mg/dL (ref 0.4–1.2)
Eosinophils Absolute: 0.1 10*3/uL (ref 0.0–0.7)
GFR calc non Af Amer: 110.39 mL/min (ref >60)
Glucose, Bld: 99 mg/dL (ref 70–99)
HCT: 41.3 % (ref 36.0–46.0)
Lymphocytes Relative: 23 % (ref 12.0–46.0)
Lymphs Abs: 2 10*3/uL (ref 0.7–4.0)
MCV: 82.5 fL (ref 78.0–100.0)
Monocytes Relative: 8.3 % (ref 3.0–12.0)
Neutro Abs: 6 10*3/uL (ref 1.4–7.7)
Potassium: 3.7 meq/L (ref 3.5–5.1)
RDW: 15.3 % — ABNORMAL HIGH (ref 11.5–14.6)
Sodium: 145 meq/L (ref 135–145)
TSH: 2.21 microintl units/mL (ref 0.35–5.50)
WBC: 8.9 10*3/uL (ref 4.5–10.5)

## 2009-06-01 ENCOUNTER — Ambulatory Visit: Payer: Self-pay | Admitting: Family Medicine

## 2009-06-17 ENCOUNTER — Emergency Department (HOSPITAL_COMMUNITY): Admission: EM | Admit: 2009-06-17 | Discharge: 2009-06-17 | Payer: Self-pay | Admitting: Emergency Medicine

## 2009-06-27 ENCOUNTER — Ambulatory Visit: Payer: Self-pay | Admitting: Family Medicine

## 2009-06-27 LAB — CONVERTED CEMR LAB
Bilirubin Urine: NEGATIVE
Casts: 0 /lpf
Nitrite: NEGATIVE
RBC / HPF: 0
Urobilinogen, UA: 0.2
Yeast, UA: 0

## 2009-07-09 ENCOUNTER — Telehealth: Payer: Self-pay | Admitting: Family Medicine

## 2009-07-10 ENCOUNTER — Telehealth: Payer: Self-pay | Admitting: Family Medicine

## 2009-07-11 ENCOUNTER — Emergency Department (HOSPITAL_COMMUNITY): Admission: EM | Admit: 2009-07-11 | Discharge: 2009-07-11 | Payer: Self-pay | Admitting: Emergency Medicine

## 2009-08-06 ENCOUNTER — Ambulatory Visit: Payer: Self-pay | Admitting: Family Medicine

## 2009-08-08 LAB — CONVERTED CEMR LAB
Albumin: 3.7 g/dL (ref 3.5–5.2)
BUN: 13 mg/dL (ref 6–23)
Calcium: 8.9 mg/dL (ref 8.4–10.5)
GFR calc non Af Amer: 102.39 mL/min (ref >60)
Potassium: 4.2 meq/L (ref 3.5–5.1)

## 2009-08-21 ENCOUNTER — Ambulatory Visit: Payer: Self-pay | Admitting: Family Medicine

## 2009-09-22 ENCOUNTER — Emergency Department (HOSPITAL_COMMUNITY): Admission: EM | Admit: 2009-09-22 | Discharge: 2009-09-22 | Payer: Self-pay | Admitting: Emergency Medicine

## 2009-09-25 ENCOUNTER — Encounter: Payer: Self-pay | Admitting: Family Medicine

## 2009-10-02 ENCOUNTER — Ambulatory Visit: Payer: Self-pay | Admitting: Family Medicine

## 2009-10-08 ENCOUNTER — Emergency Department (HOSPITAL_COMMUNITY): Admission: EM | Admit: 2009-10-08 | Discharge: 2009-10-08 | Payer: Self-pay | Admitting: Emergency Medicine

## 2009-10-10 ENCOUNTER — Ambulatory Visit: Payer: Self-pay | Admitting: Family Medicine

## 2009-10-12 ENCOUNTER — Ambulatory Visit: Payer: Self-pay | Admitting: Family Medicine

## 2009-10-23 ENCOUNTER — Telehealth: Payer: Self-pay | Admitting: Family Medicine

## 2009-10-25 ENCOUNTER — Ambulatory Visit: Payer: Self-pay | Admitting: Family Medicine

## 2009-10-25 LAB — CONVERTED CEMR LAB
Bacteria, UA: 0
Casts: 0 /lpf
Mucus, UA: 0
Nitrite: NEGATIVE
Specific Gravity, Urine: 1.01
Urine crystals, microscopic: 0 /hpf
Urobilinogen, UA: 0.2
Whiff Test: NEGATIVE
pH: 6

## 2009-10-28 ENCOUNTER — Emergency Department (HOSPITAL_COMMUNITY): Admission: EM | Admit: 2009-10-28 | Discharge: 2009-10-28 | Payer: Self-pay | Admitting: Emergency Medicine

## 2009-10-31 ENCOUNTER — Telehealth: Payer: Self-pay | Admitting: Family Medicine

## 2009-11-01 ENCOUNTER — Ambulatory Visit: Payer: Self-pay | Admitting: Family Medicine

## 2009-11-06 ENCOUNTER — Encounter: Payer: Self-pay | Admitting: Family Medicine

## 2009-11-06 LAB — HM DIABETES EYE EXAM: HM Diabetic Eye Exam: NORMAL

## 2009-11-08 ENCOUNTER — Ambulatory Visit: Payer: Self-pay | Admitting: Family Medicine

## 2009-11-08 ENCOUNTER — Telehealth (INDEPENDENT_AMBULATORY_CARE_PROVIDER_SITE_OTHER): Payer: Self-pay | Admitting: *Deleted

## 2009-11-08 LAB — CONVERTED CEMR LAB
Bilirubin Urine: NEGATIVE
Glucose, Urine, Semiquant: NEGATIVE
Nitrite: NEGATIVE
Specific Gravity, Urine: 1.02
Urine crystals, microscopic: 0 /hpf
Urobilinogen, UA: 0.2
pH: 6

## 2009-11-09 ENCOUNTER — Telehealth: Payer: Self-pay | Admitting: Family Medicine

## 2009-11-09 ENCOUNTER — Encounter: Payer: Self-pay | Admitting: Family Medicine

## 2009-11-16 ENCOUNTER — Ambulatory Visit: Payer: Self-pay | Admitting: Internal Medicine

## 2009-11-16 LAB — CONVERTED CEMR LAB
Casts: 0 /lpf
Glucose, Urine, Semiquant: NEGATIVE
Ketones, urine, test strip: NEGATIVE
Nitrite: NEGATIVE
Specific Gravity, Urine: >=1.03
Urine crystals, microscopic: 0 /hpf
Urobilinogen, UA: 0.2
pH: 6

## 2009-11-17 ENCOUNTER — Encounter: Payer: Self-pay | Admitting: Family Medicine

## 2009-11-22 ENCOUNTER — Ambulatory Visit: Payer: Self-pay | Admitting: Family Medicine

## 2009-11-27 ENCOUNTER — Ambulatory Visit: Payer: Self-pay | Admitting: Family Medicine

## 2009-11-30 ENCOUNTER — Ambulatory Visit: Payer: Self-pay | Admitting: Internal Medicine

## 2009-12-03 ENCOUNTER — Ambulatory Visit: Payer: Self-pay | Admitting: Family Medicine

## 2009-12-04 ENCOUNTER — Telehealth: Payer: Self-pay | Admitting: Family Medicine

## 2009-12-06 LAB — CONVERTED CEMR LAB
Albumin: 3.7 g/dL (ref 3.5–5.2)
Basophils Absolute: 0.1 10*3/uL (ref 0.0–0.1)
CO2: 27 meq/L (ref 19–32)
Chloride: 104 meq/L (ref 96–112)
Eosinophils Absolute: 0.1 10*3/uL (ref 0.0–0.7)
Glucose, Bld: 109 mg/dL — ABNORMAL HIGH (ref 70–99)
Lymphs Abs: 2.4 10*3/uL (ref 0.7–4.0)
MCV: 85.1 fL (ref 78.0–100.0)
Monocytes Relative: 5.2 % (ref 3.0–12.0)
Neutro Abs: 6.4 10*3/uL (ref 1.4–7.7)
Platelets: 362 10*3/uL (ref 150.0–400.0)
Potassium: 3.7 meq/L (ref 3.5–5.1)
Sodium: 140 meq/L (ref 135–145)
Total Bilirubin: 0.3 mg/dL (ref 0.3–1.2)

## 2009-12-07 ENCOUNTER — Ambulatory Visit: Payer: Self-pay | Admitting: Family Medicine

## 2009-12-07 LAB — CONVERTED CEMR LAB
Glucose, Urine, Semiquant: NEGATIVE
Ketones, urine, test strip: NEGATIVE
Nitrite: NEGATIVE
Specific Gravity, Urine: 1.015
Urobilinogen, UA: 0.2
Yeast, UA: 0
pH: 7

## 2009-12-08 ENCOUNTER — Encounter: Payer: Self-pay | Admitting: Family Medicine

## 2009-12-21 ENCOUNTER — Telehealth: Payer: Self-pay | Admitting: Family Medicine

## 2009-12-21 ENCOUNTER — Encounter: Payer: Self-pay | Admitting: Family Medicine

## 2009-12-25 ENCOUNTER — Telehealth: Payer: Self-pay | Admitting: Family Medicine

## 2009-12-25 ENCOUNTER — Encounter: Payer: Self-pay | Admitting: Family Medicine

## 2009-12-27 ENCOUNTER — Emergency Department (HOSPITAL_COMMUNITY): Admission: EM | Admit: 2009-12-27 | Discharge: 2009-06-24 | Payer: Self-pay | Admitting: Emergency Medicine

## 2009-12-27 ENCOUNTER — Emergency Department (HOSPITAL_COMMUNITY): Admission: EM | Admit: 2009-12-27 | Discharge: 2009-06-20 | Payer: Self-pay | Admitting: Emergency Medicine

## 2010-01-01 ENCOUNTER — Ambulatory Visit: Payer: Self-pay | Admitting: Internal Medicine

## 2010-01-18 ENCOUNTER — Ambulatory Visit
Admission: RE | Admit: 2010-01-18 | Discharge: 2010-01-18 | Payer: Self-pay | Source: Home / Self Care | Attending: Family Medicine | Admitting: Family Medicine

## 2010-01-23 ENCOUNTER — Telehealth: Payer: Self-pay | Admitting: Family Medicine

## 2010-01-28 ENCOUNTER — Other Ambulatory Visit: Payer: Self-pay | Admitting: Family Medicine

## 2010-01-28 ENCOUNTER — Ambulatory Visit
Admission: RE | Admit: 2010-01-28 | Discharge: 2010-01-28 | Payer: Self-pay | Source: Home / Self Care | Attending: Family Medicine | Admitting: Family Medicine

## 2010-01-28 LAB — LIPID PANEL
Cholesterol: 148 mg/dL (ref 0–200)
HDL: 35.6 mg/dL — ABNORMAL LOW (ref >39.00)
LDL Cholesterol: 82 mg/dL (ref 0–99)
Total CHOL/HDL Ratio: 4
Triglycerides: 150 mg/dL — ABNORMAL HIGH (ref 0.0–149.0)
VLDL: 30 mg/dL (ref 0.0–40.0)

## 2010-01-28 LAB — RENAL FUNCTION PANEL
Albumin: 3.5 g/dL (ref 3.5–5.2)
BUN: 10 mg/dL (ref 6–23)
CO2: 29 mEq/L (ref 19–32)
Calcium: 8.9 mg/dL (ref 8.4–10.5)
Chloride: 107 mEq/L (ref 96–112)
Creatinine, Ser: 0.5 mg/dL (ref 0.4–1.2)
GFR: 139.11 mL/min (ref >60.00)
Glucose, Bld: 118 mg/dL — ABNORMAL HIGH (ref 70–99)
Phosphorus: 3.2 mg/dL (ref 2.3–4.6)
Potassium: 4 mEq/L (ref 3.5–5.1)
Sodium: 144 mEq/L (ref 135–145)

## 2010-01-28 LAB — MICROALBUMIN / CREATININE URINE RATIO
Creatinine,U: 109.5 mg/dL
Microalb Creat Ratio: 3.2 mg/g (ref 0.0–30.0)
Microalb, Ur: 3.5 mg/dL — ABNORMAL HIGH (ref 0.0–1.9)

## 2010-01-28 LAB — HEMOGLOBIN A1C: Hgb A1c MFr Bld: 6.8 % — ABNORMAL HIGH (ref 4.6–6.5)

## 2010-01-28 LAB — AST: AST: 36 U/L (ref 0–37)

## 2010-01-28 LAB — ALT: ALT: 50 U/L — ABNORMAL HIGH (ref 0–35)

## 2010-02-01 ENCOUNTER — Ambulatory Visit
Admission: RE | Admit: 2010-02-01 | Discharge: 2010-02-01 | Payer: Self-pay | Source: Home / Self Care | Attending: Family Medicine | Admitting: Family Medicine

## 2010-02-04 ENCOUNTER — Ambulatory Visit
Admission: RE | Admit: 2010-02-04 | Discharge: 2010-02-04 | Payer: Self-pay | Source: Home / Self Care | Attending: Family Medicine | Admitting: Family Medicine

## 2010-02-19 NOTE — Progress Notes (Signed)
----   Converted from flag ---- ---- 11/08/2009 10:47 AM, Colon Flattery Tower MD wrote: use uti 599- thanks  ---- 11/08/2009 9:31 AM, Liane Comber CMA (AAMA) wrote: Pt's insurance will not accept dx code back pain for urine culture. Is there any other dx we can use for her? Thanks Tasha ------------------------------

## 2010-02-19 NOTE — Progress Notes (Signed)
Summary: Ketoconazole 2% shampoo  Phone Note Refill Request Call back at [phone] Message from:  [facility] on December 21, 2009 1:45 PM  Refills Requested: Medication #1:  KETOCONAZOLE 2 % SHAM use as directed twice weekly as needed.   Last Refilled: 03/21/2009 [facility] electronically request refill on Ketoconazole 2% shampoo. Last refill 03/21/2009. I spoke with pt because this med had been removed from med list. Pt said she only needs once in a while.Please advise.    Method Requested: Electronic Initial call taken by: Lewanda Rife LPN,  December 21, 2009 1:46 PM  Follow-up for Phone Call        that is fine  Follow-up by: Judith Part MD,  December 21, 2009 1:53 PM  Additional Follow-up for Phone Call Additional follow up Details #1::        Rx sent in as directed Additional Follow-up by: Janee Morn CMA Duncan Dull),  December 21, 2009 4:17 PM    Prescriptions: KETOCONAZOLE 2 % SHAM (KETOCONAZOLE) use as directed twice weekly as needed  #1 med x 3   Entered and Authorized by:   Judith Part MD   Signed by:   Selena Batten Dance CMA (AAMA) on 12/21/2009   Method used:   Telephoned to ...       [facility] (retail)       [address]       [place], [place]  [address]       Ph: [phone] or [phone]       Fax: [phone]   RxID:   [id number]

## 2010-02-19 NOTE — Assessment & Plan Note (Signed)
Summary: [place] F/U 5/29 AND 06/24/09 DLO   Vital Signs:  Patient profile:   55 year old female Height:      62 inches Weight:      216 pounds BMI:     39.65 Temp:     98.5 degrees F oral Pulse rate:   88 / minute Pulse rhythm:   regular BP sitting:   116 / 78  (left arm) Cuff size:   large  Vitals Entered By: Lewanda Rife LPN (June 28, 979 2:18 PM) CC: f/u [place] on 06/17/09 and 06/24/09   History of Present Illness: has been seen in ER 3 times for back pain and uti  was initially tx with cipro that did not work - and then [employer]   is feeling ok overall now -- no longer weak like she was   gave her pill for yeast -- diflucan at last visit for yeast too also some iv fluids   no more burning or itching  no more back pain no fever     Allergies: 1)  ! Augmentin 2)  * Ace Inhibitors Group 3)  Ibuprofen (Ibuprofen)  Past History:  Past Medical History: Last updated: 03/23/2008 GERD Hyperlipidemia Hypertension obesity MR hyperglycemia all rhinitis OA in R foot glaucoma   podiatry Dr Ralene Cork dentist Dr Ulysees Barns Dr Dione Booze derm-- Dr Terri Piedra  Past Surgical History: Last updated: 03/23/2008 Hysterectomy arm fx- past cataract surgery   Family History: Last updated: 2009/02/23 mother DM- deceased father - deceased  aunt DM- deceased , CVA, pacemaker   Social History: Last updated: 08/13/2007 lives alone- with help from neighbors currently- disability for arm fx non smoker no alcohol   Review of Systems General:  Complains of fatigue; denies chills, fever, loss of appetite, and malaise. Eyes:  Denies blurring and eye irritation. CV:  Denies chest pain or discomfort and palpitations. Resp:  Denies cough, shortness of breath, and wheezing. GI:  Denies abdominal pain, change in bowel habits, nausea, and vomiting. GU:  Denies dysuria, hematuria, and urinary frequency. Derm:  Denies itching, lesion(s), poor wound healing, and rash. Heme:  Denies  abnormal bruising and bleeding.  Physical Exam  General:  overweight but generally well appearing hygiene is fair  Head:  normocephalic, atraumatic, and no abnormalities observed.   Mouth:  pharynx pink and moist, no erythema, and no exudates.   Neck:  supple with full rom and no masses or thyromegally, no JVD or carotid bruit  Lungs:  Normal respiratory effort, chest expands symmetrically. Lungs are clear to auscultation, no crackles or wheezes. Heart:  Normal rate and regular rhythm. S1 and S2 normal without gallop, murmur, click, rub or other extra sounds. Abdomen:  no suprapubic tenderness or fullness felt  Msk:  no LS or CVA tenderness  Extremities:  no CCE Neurologic:  strength normal in all extremities, sensation intact to pinprick, and gait normal.   Skin:  Intact without suspicious lesions or rashes Cervical Nodes:  No lymphadenopathy noted Inguinal Nodes:  No significant adenopathy Psych:  normal affect, talkative and pleasant    Impression & Recommendations:  Problem # 1:  UTI (ICD-599.0) Assessment New now feeling better with macrobid  no more back pain or weakness  will send urine for cx no dehydration today- enc to keep up good water intake given handout from [facility] on utis as well  Her updated medication list for this problem includes:    Nitrofurantoin Macrocrystal 100 Mg Caps (Nitrofurantoin macrocrystal) .Marland Kitchen... Take one  capsule twice a day for 7 days  Orders: T-Culture, Urine ([address]-60454) UA Dipstick W/ Micro (manual) (09811)  Problem # 2:  VAGINITIS, CANDIDAL (ICD-112.1) Assessment: New this was tx in ER and is resolved after diflucan  adv to call if symptoms return  Her updated medication list for this problem includes:    Ketoconazole 2 % Crea (Ketoconazole) .Marland Kitchen... Apply 1 a small amount to affected area twice a day as needed    Nystatin 100000 Unit/gm Crea (Nystatin) .Marland Kitchen... Apply to rash under right arm and under breasts (red area) twice daily until  better  Complete Medication List: 1)  Desonide 0.05 % Crea (Desonide) 2)  Ketoconazole 2 % Crea (Ketoconazole) .... Apply 1 a small amount to affected area twice a day as needed 3)  Cartia Xt 240 Mg Cp24 (Diltiazem hcl coated beads) .Marland Kitchen.. 1 by mouth once daily 4)  Lipitor 20 Mg Tabs (Atorvastatin calcium) .... Take one by mouth daily 5)  Omeprazole 20 Mg Cpdr (Omeprazole) .Marland Kitchen.. 1 by mouth every am 6)  Hydrochlorothiazide 12.5 Mg Tabs (Hydrochlorothiazide) .... Take 1 tablet by mouth once a day 7)  Klor-con M20 20 Meq Tbcr (Potassium chloride crys cr) .... Take 1 tablet by mouth once a day 8)  Calcium 500/d 500-200 Mg-unit Tabs (Calcium carbonate-vitamin d) .... Once daily 9)  Clarinex 5 Mg Tabs (Desloratadine) .Marland Kitchen.. 1 by mouth once daily as needed allergies 10)  Aspirin 81 Mg Tabs (Aspirin) .Marland Kitchen.. 1 by mouth once daily with food 11)  Nystatin 100000 Unit/gm Crea (Nystatin) .... Apply to rash under right arm and under breasts (red area) twice daily until better 12)  Metformin Hcl 500 Mg Tabs (Metformin hcl) .... Take one by mouth two times a day 13)  Triple Abt Oint  .... Apply twice a day to lesion until healed as needed 14)  Tessalon Perles 100 Mg Caps (Benzonatate) .Marland Kitchen.. 1 by mouth up to three times a day as needed cough - swallow whole 15)  Pepto-bismol Max Strength 525 Mg/42ml Susp (Bismuth subsalicylate) .... Otc as directed. 16)  Metamucil 30.9 % Powd (Psyllium) .... Otc as directed. 17)  Flonase 50 Mcg/act Susp (Fluticasone propionate) .Marland Kitchen.. 1 spray in each nostril once daily for 2 weeks for ear problem  stop it if you get a nosebleed 18)  Nitrofurantoin Macrocrystal 100 Mg Caps (Nitrofurantoin macrocrystal) .... Take one capsule twice a day for 7 days 19)  Probiotic Caps (Probiotic product) .... Take 1 capsule by mouth once a day 20)  Loperamide Hcl 2 Mg Caps (Loperamide hcl) .... Take two caplets after 1st loose stool then 1 captlet after each subsequent loose stool, not to take more than 4  caplets in 24 hr period.  Patient Instructions: 1)  finish your macrobid (nitrofurantoin) until they are all gone-- for urinary tract infection 2)  keep drinking lots of water  3)  I will send your urine for a culture --and call you when it returns with any more instructions  4)  you let me know if your symptoms get worse again   Current Allergies (reviewed today): ! AUGMENTIN * ACE INHIBITORS GROUP IBUPROFEN (IBUPROFEN)  Laboratory Results   Urine Tests  Date/Time Received: June 27, 2009 2:22 PM  Date/Time Reported: June 27, 2009 2:22 PM   Routine Urinalysis   Color: yellow Appearance: Hazy Glucose: negative   (Normal Range: Negative) Bilirubin: negative   (Normal Range: Negative) Ketone: negative   (Normal Range: Negative) Spec. Gravity: 1.015   (Normal  Range: 1.003-1.035) Blood: trace-intact   (Normal Range: Negative) pH: 6.5   (Normal Range: 5.0-8.0) Protein: trace   (Normal Range: Negative) Urobilinogen: 0.2   (Normal Range: 0-1) Nitrite: negative   (Normal Range: Negative) Leukocyte Esterace: small   (Normal Range: Negative)  Urine Microscopic WBC/HPF: 1-2 RBC/HPF: 0 Bacteria/HPF: few Mucous/HPF: few Epithelial/HPF: 1-3 Crystals/HPF: 0 Casts/LPF: 0 Yeast/HPF: 0 Other: 0

## 2010-02-19 NOTE — Progress Notes (Signed)
Summary: regarding disability forms  Phone Note Call from Patient   Caller: Patient Call For: Judith Part MD Summary of Call: Pt has dropped off disability forms to be completed, forms are on your shelf. Initial call taken by: Lowella Petties CMA,  July 10, 2009 2:56 PM  Follow-up for Phone Call        I don't think I have done her disability forms before ... I believe her disability is from her broken arm in the past - and ? her ortho does this ... I put forms back in her IN box may need to ask her who did her last forms  Follow-up by: Judith Part MD,  July 16, 2009 8:09 AM  Additional Follow-up for Phone Call Additional follow up Details #1::        Left message for patient to call back. Lewanda Rife LPN  July 16, 2009 9:01 AM   Patient notified as instructed by telephone. Uncompletetd form left at front desk. Pt will pick up and take to her orthopedic dr.[facility] LPN  July 16, 2009 2:43 PM

## 2010-02-19 NOTE — Assessment & Plan Note (Signed)
Summary: EAR/CLE   Vital Signs:  Patient profile:   55 year old female Height:      62 inches Weight:      223.25 pounds BMI:     40.98 Temp:     97.5 degrees F oral Pulse rate:   100 / minute Pulse rhythm:   regular BP sitting:   130 / 80  (left arm) Cuff size:   regular  Vitals Entered By: Lewanda Rife LPN (May 08, 2009 10:40 AM) CC: right ear sounds like whistling   History of Present Illness: 55 yo here for right ear whistling that started this morning. No ear pain, fevers, sore throat or cough. She does have seasonal allergies, takes clarinex. Has been sneezing a little more lately.  No ringing or decreased hearing.  Current Medications (verified): 1)  Desonide 0.05 % Crea (Desonide) 2)  Ketoconazole 2 % Crea (Ketoconazole) .... Apply 1 A Small Amount To Affected Area Twice A Day As Needed 3)  Cartia Xt 240 Mg  Cp24 (Diltiazem Hcl Coated Beads) .Marland Kitchen.. 1 By Mouth Once Daily 4)  Lipitor 20 Mg Tabs (Atorvastatin Calcium) .... Take One By Mouth Daily 5)  Omeprazole 20 Mg  Cpdr (Omeprazole) .Marland Kitchen.. 1 By Mouth Every Am 6)  Hydrochlorothiazide 12.5 Mg  Tabs (Hydrochlorothiazide) .... Take 1 Tablet By Mouth Once A Day 7)  Klor-Con M20 20 Meq  Tbcr (Potassium Chloride Crys Cr) .... Take 1 Tablet By Mouth Once A Day 8)  Calcium 500/d 500-200 Mg-Unit  Tabs (Calcium Carbonate-Vitamin D) .... Once Daily 9)  Ketoconazole 2 %  Sham (Ketoconazole) .... Use As Directed Twice Weekly Prn 10)  Clarinex 5 Mg  Tabs (Desloratadine) .Marland Kitchen.. 1 By Mouth Once Daily As Needed Allergies 11)  Aspirin 81 Mg Tabs (Aspirin) .Marland Kitchen.. 1 By Mouth Once Daily With Food 12)  Nystatin 100000 Unit/gm Crea (Nystatin) .... Apply To Rash Under Right Arm and Under Breasts (Red Area) Twice Daily Until Better 13)  Metformin Hcl 500 Mg Tabs (Metformin Hcl) .... Take One By Mouth Two Times A Day 14)  Triple Abt Oint .... Apply Twice A Day To Lesion Until Healed As Needed 15)  Tessalon Perles 100 Mg Caps (Benzonatate) .Marland Kitchen.. 1 By  Mouth Up To Three Times A Day As Needed Cough - Swallow Whole  Allergies: 1)  ! Augmentin 2)  * Ace Inhibitors Group 3)  Ibuprofen (Ibuprofen)  Review of Systems      See HPI General:  Denies chills and fever. ENT:  Denies decreased hearing, earache, nasal congestion, postnasal drainage, sinus pressure, and sore throat. Resp:  Denies cough.  Physical Exam  General:  overweight but generally well appearing hygiene is fair  Eyes:  vision grossly intact, pupils equal, pupils round, pupils reactive to light, and no injection.   Ears:  mild fluid, clear, TMs bilaterally. Nose:  nares boggy but clear  Mouth:  pharynx pink and moist, no erythema, and no exudates.   Lungs:  Normal respiratory effort, chest expands symmetrically. Lungs are clear to auscultation, no crackles or wheezes. Heart:  Normal rate and regular rhythm. S1 and S2 normal without gallop, murmur, click, rub or other extra sounds. Psych:  baseline affect- MR    Impression & Recommendations:  Problem # 1:  ALLERGIC RHINITIS, SEASONAL (ICD-477.0) Assessment Deteriorated Reassurance provdied.  Advised taking Clarinex daily for next few days to see if helps.  If symptoms worsen, come back to see [place] later this week.  Complete Medication List: 1)  Desonide  0.05 % Crea (Desonide) 2)  Ketoconazole 2 % Crea (Ketoconazole) .... Apply 1 a small amount to affected area twice a day as needed 3)  Cartia Xt 240 Mg Cp24 (Diltiazem hcl coated beads) .Marland Kitchen.. 1 by mouth once daily 4)  Lipitor 20 Mg Tabs (Atorvastatin calcium) .... Take one by mouth daily 5)  Omeprazole 20 Mg Cpdr (Omeprazole) .Marland Kitchen.. 1 by mouth every am 6)  Hydrochlorothiazide 12.5 Mg Tabs (Hydrochlorothiazide) .... Take 1 tablet by mouth once a day 7)  Klor-con M20 20 Meq Tbcr (Potassium chloride crys cr) .... Take 1 tablet by mouth once a day 8)  Calcium 500/d 500-200 Mg-unit Tabs (Calcium carbonate-vitamin d) .... Once daily 9)  Ketoconazole 2 % Sham (Ketoconazole) ....  Use as directed twice weekly prn 10)  Clarinex 5 Mg Tabs (Desloratadine) .Marland Kitchen.. 1 by mouth once daily as needed allergies 11)  Aspirin 81 Mg Tabs (Aspirin) .Marland Kitchen.. 1 by mouth once daily with food 12)  Nystatin 100000 Unit/gm Crea (Nystatin) .... Apply to rash under right arm and under breasts (red area) twice daily until better 13)  Metformin Hcl 500 Mg Tabs (Metformin hcl) .... Take one by mouth two times a day 14)  Triple Abt Oint  .... Apply twice a day to lesion until healed as needed 15)  Tessalon Perles 100 Mg Caps (Benzonatate) .Marland Kitchen.. 1 by mouth up to three times a day as needed cough - swallow whole  Current Allergies (reviewed today): ! AUGMENTIN * ACE INHIBITORS GROUP IBUPROFEN (IBUPROFEN)

## 2010-02-19 NOTE — Assessment & Plan Note (Signed)
Summary: sinus/stomach problems/rbh   Vital Signs:  Patient profile:   55 year old female Weight:      207 pounds Temp:     97.8 degrees F oral Pulse rate:   84 / minute Pulse rhythm:   regular BP sitting:   122 / 80  (left arm) Cuff size:   large  Vitals Entered By: Sydell Axon LPN (October 10, 2009 10:04 AM) CC: Sinus problems, sneezing and stomach problems, patient went to [facility] ER Monday   History of Present Illness: here forf/u of acute sinus and stomach problems   went to ER monday  started getting sick with sinus congestion on monday am early  went and got her flu shot (? when)  by 3 am -- started bad cough and had phlegm come up and spit it -- then it made her vomit her breakfast  took her regular med and went on to the ER   was told she was "taking a cold " and got checked out  gave her some tylenol  then gave her a breathing treatment  gave her med and inhaler (ventolin hfa), and hydrocodone  chlorpheniram suspension , also gave her some prenisone -- will finish that taper in 2 d   still coughing -- some phlegm white this am    Allergies: 1)  ! Augmentin 2)  * Ace Inhibitors Group 3)  Ibuprofen (Ibuprofen)  Physical Exam  Lungs:  harsh bs at bases no rales/ rhonchi or wheeze (even on forced exp) good air exch not sob     Impression & Recommendations:  Problem # 1:  BRONCHITIS- ACUTE (ICD-466.0) Assessment New mild bronchitis with uri -- and good air exch  adv pt to finish pred and use ventolin as needed (not wheeze today) will addn cover with zithromax since she is not feeling better re check fri but call earlier if worse she may also be somewhat anxious from the prednisone Her updated medication list for this problem includes:    Tessalon Perles 100 Mg Caps (Benzonatate) .Marland Kitchen... 1 by mouth up to three times a day as needed cough - swallow whole    Ventolin Hfa 108 (90 Base) Mcg/act Aers (Albuterol sulfate) .Marland Kitchen... Take 2 puffs by mouth every 4  hours as needed for shortness of breath or cough    Tussionex Pennkinetic Er 10-8 Mg/48ml Lqcr (Hydrocod polst-chlorphen polst) .Marland Kitchen... Take 1/2 teaspoon by mouth every 12 hours as needed cough    Zithromax Z-pak 250 Mg Tabs (Azithromycin) .Marland Kitchen... Take by mouth as directed  Complete Medication List: 1)  Desonide 0.05 % Crea (Desonide) 2)  Ketoconazole 2 % Crea (Ketoconazole) .... Apply 1 a small amount to affected area twice a day as needed 3)  Cartia Xt 240 Mg Cp24 (Diltiazem hcl coated beads) .Marland Kitchen.. 1 by mouth once daily 4)  Lipitor 20 Mg Tabs (Atorvastatin calcium) .... Take one by mouth daily 5)  Omeprazole 20 Mg Cpdr (Omeprazole) .Marland Kitchen.. 1 by mouth every am 6)  Hydrochlorothiazide 12.5 Mg Tabs (Hydrochlorothiazide) .... Take 1 tablet by mouth once a day 7)  Klor-con M20 20 Meq Tbcr (Potassium chloride crys cr) .... Take 1 tablet by mouth once a day 8)  Calcium 500/d 500-200 Mg-unit Tabs (Calcium carbonate-vitamin d) .... Once daily 9)  Clarinex 5 Mg Tabs (Desloratadine) .Marland Kitchen.. 1 by mouth once daily as needed allergies 10)  Aspirin 81 Mg Tabs (Aspirin) .Marland Kitchen.. 1 by mouth once daily with food 11)  Nystatin 100000 Unit/gm Crea (Nystatin) .... Apply  to rash under right arm and under breasts (red area) twice daily until better 12)  Metformin Hcl 500 Mg Tabs (Metformin hcl) .... Take one by mouth two times a day 13)  Triple Abt Oint  .... Apply twice a day to lesion until healed as needed 14)  Tessalon Perles 100 Mg Caps (Benzonatate) .Marland Kitchen.. 1 by mouth up to three times a day as needed cough - swallow whole 15)  Pepto-bismol Max Strength 525 Mg/70ml Susp (Bismuth subsalicylate) .... Otc as directed. 16)  Metamucil 30.9 % Powd (Psyllium) .... Otc as directed. 17)  Flonase 50 Mcg/act Susp (Fluticasone propionate) .Marland Kitchen.. 1 spray in each nostril once daily for 2 weeks for ear problem  stop it if you get a nosebleed 18)  Nitrofurantoin Macrocrystal 100 Mg Caps (Nitrofurantoin macrocrystal) .... Take one capsule twice a  day for 7 days 19)  Probiotic Caps (Probiotic product) .... Take 1 capsule by mouth once a day 20)  Loperamide Hcl 2 Mg Caps (Loperamide hcl) .... Take two caplets after 1st loose stool then 1 captlet after each subsequent loose stool, not to take more than 4 caplets in 24 hr period. 21)  Ventolin Hfa 108 (90 Base) Mcg/act Aers (Albuterol sulfate) .... Take 2 puffs by mouth every 4 hours as needed for shortness of breath or cough 22)  Prednisone 20 Mg Tabs (Prednisone) .... Take 2 by mouth every day for 5 days 23)  Tussionex Pennkinetic Er 10-8 Mg/44ml Lqcr (Hydrocod polst-chlorphen polst) .... Take 1/2 teaspoon by mouth every 12 hours as needed cough 24)  Zithromax Z-pak 250 Mg Tabs (Azithromycin) .... Take by mouth as directed  Patient Instructions: 1)  continue your medicines from the ER 2)  use the inhaler as needed if you wheeze or feel short of breath  3)  I sent px for zithromax to your pharmacy- go get it and take as directed  4)  follow up with me friday for a re check  5)  get lots of rest and drink fluids  Prescriptions: ZITHROMAX Z-PAK 250 MG TABS (AZITHROMYCIN) take by mouth as directed  #1 pack x 0   Entered and Authorized by:   Judith Part MD   Signed by:   Judith Part MD on 10/10/2009   Method used:   [facility]        [facility] [id number]       [address]       [place], [place]  [address]       Ph: [phone] or [phone]       Fax: [phone]   RxID:   [id number]   Current Allergies (reviewed today): ! AUGMENTIN * ACE INHIBITORS GROUP IBUPROFEN (IBUPROFEN)  Appended Document: sinus/stomach problems/rbh     Allergies: 1)  ! Augmentin 2)  * Ace Inhibitors Group 3)  Ibuprofen (Ibuprofen)  Past History:  Past Medical History: Last updated: 10/02/2009 GERD Hyperlipidemia Hypertension obesity MR hyperglycemia all rhinitis OA in R foot glaucoma chronic low back pain with radiculopathy (deg disk)  podiatry  Dr Ralene Cork dentist Dr Ulysees Barns Dr Dione Booze derm-- Dr Terri Piedra ortho [facility]  Past Surgical History: Last updated: 03/23/2008 Hysterectomy arm fx- past cataract surgery   Family History: Last updated: 02-16-2009 mother DM- deceased father - deceased  aunt DM- deceased , CVA, pacemaker   Social History: Last updated: 08/13/2007 lives alone- with help from neighbors currently- disability for arm fx non smoker no alcohol   Review of Systems General:  Complains of chills, fatigue, fever, loss of appetite, and malaise. Eyes:  Denies blurring, discharge, and eye irritation. ENT:  Complains of nasal congestion, sinus pressure, and sore throat. CV:  Denies chest pain or discomfort and palpitations. Resp:  Complains of cough, sputum productive, and wheezing; denies pleuritic and shortness of breath. GI:  Complains of nausea and vomiting; denies diarrhea. GU:  Denies dysuria. MS:  Denies joint pain. Derm:  Denies itching and rash. Neuro:  Denies headaches. Heme:  Denies abnormal bruising and bleeding.  Physical Exam  General:  overwt and fatigued appearing Head:  normocephalic, atraumatic, and no abnormalities observed.  no sinus tenderness Eyes:  vision grossly intact, pupils equal, pupils round, and pupils reactive to light.  no conjunctival pallor, injection or icterus  Ears:  R ear normal and L ear normal.   Nose:  .narea  Mouth:  pharynx pink and moist, no erythema, and no exudates.   Neck:  supple with full rom and no masses or thyromegally, no JVD or carotid bruit  Chest Wall:  No deformities, masses, or tenderness noted. Heart:  Normal rate and regular rhythm. S1 and S2 normal without gallop, murmur, click, rub or other extra sounds. Abdomen:  Bowel sounds positive,abdomen soft and non-tender without masses, organomegaly or hernias noted. Msk:  No deformity or scoliosis noted of thoracic or lumbar spine.   Extremities:  No clubbing, cyanosis, edema, or deformity noted  with normal full range of motion of all joints.   Skin:  Intact without suspicious lesions or rashes Cervical Nodes:  No lymphadenopathy noted Psych:  normal affect, talkative and pleasant - baseline MR   Diabetes Management Exam:    Foot Exam (with socks and/or shoes not present):       Sensory-Pinprick/Light touch:          Left medial foot (L-4): normal          Left dorsal foot (L-5): normal          Left lateral foot (S-1): normal          Right medial foot (L-4): normal          Right dorsal foot (L-5): normal          Right lateral foot (S-1): normal       Sensory-Monofilament:          Left foot: normal          Right foot: normal       Inspection:          Left foot: normal          Right foot: normal       Nails:          Left foot: normal          Right foot: normal   Complete Medication List: 1)  Desonide 0.05 % Crea (Desonide) 2)  Ketoconazole 2 % Crea (Ketoconazole) .... Apply 1 a small amount to affected area twice a day as needed 3)  Cartia Xt 240 Mg Cp24 (Diltiazem hcl coated beads) .Marland Kitchen.. 1 by mouth once daily 4)  Lipitor 20 Mg Tabs (Atorvastatin calcium) .... Take one by mouth daily 5)  Omeprazole 20 Mg Cpdr (Omeprazole) .Marland Kitchen.. 1 by mouth every am 6)  Hydrochlorothiazide 12.5 Mg Tabs (Hydrochlorothiazide) .... Take 1 tablet by mouth once a day 7)  Klor-con M20 20 Meq Tbcr (Potassium chloride crys cr) .... Take 1 tablet by mouth once a day 8)  Calcium 500/d 500-200 Mg-unit Tabs (  Calcium carbonate-vitamin d) .... Once daily 9)  Clarinex 5 Mg Tabs (Desloratadine) .Marland Kitchen.. 1 by mouth once daily as needed allergies 10)  Aspirin 81 Mg Tabs (Aspirin) .Marland Kitchen.. 1 by mouth once daily with food 11)  Nystatin 100000 Unit/gm Crea (Nystatin) .... Apply to rash under right arm and under breasts (red area) twice daily until better 12)  Metformin Hcl 500 Mg Tabs (Metformin hcl) .... Take one by mouth two times a day 13)  Triple Abt Oint  .... Apply twice a day to lesion until healed as  needed 14)  Tessalon Perles 100 Mg Caps (Benzonatate) .Marland Kitchen.. 1 by mouth up to three times a day as needed cough - swallow whole 15)  Pepto-bismol Max Strength 525 Mg/79ml Susp (Bismuth subsalicylate) .... Otc as directed. 16)  Metamucil 30.9 % Powd (Psyllium) .... Otc as directed. 17)  Flonase 50 Mcg/act Susp (Fluticasone propionate) .Marland Kitchen.. 1 spray in each nostril once daily for 2 weeks for ear problem  stop it if you get a nosebleed 18)  Nitrofurantoin Macrocrystal 100 Mg Caps (Nitrofurantoin macrocrystal) .... Take one capsule twice a day for 7 days 19)  Probiotic Caps (Probiotic product) .... Take 1 capsule by mouth once a day 20)  Loperamide Hcl 2 Mg Caps (Loperamide hcl) .... Take two caplets after 1st loose stool then 1 captlet after each subsequent loose stool, not to take more than 4 caplets in 24 hr period. 21)  Ventolin Hfa 108 (90 Base) Mcg/act Aers (Albuterol sulfate) .... Take 2 puffs by mouth every 4 hours as needed for shortness of breath or cough 22)  Prednisone 20 Mg Tabs (Prednisone) .... Take 2 by mouth every day for 5 days 23)  Tussionex Pennkinetic Er 10-8 Mg/23ml Lqcr (Hydrocod polst-chlorphen polst) .... Take 1/2 teaspoon by mouth every 12 hours as needed cough 24)  Zithromax Z-pak 250 Mg Tabs (Azithromycin) .... Take by mouth as directed

## 2010-02-19 NOTE — Assessment & Plan Note (Signed)
Summary: STOMACH/CLE   Vital Signs:  Patient profile:   55 year old female Height:      62 inches Weight:      220.25 pounds BMI:     40.43 Temp:     97.8 degrees F oral Pulse rate:   100 / minute Pulse rhythm:   regular BP sitting:   118 / 80  (left arm) Cuff size:   large  Vitals Entered By: Lewanda Rife LPN (May 21, 4096 11:28 AM) CC: diarrhea started 05/17/09. Better today.   History of Present Illness: stomach gave her trouble on 4/28 went out to eat lunch - ate some fish -- was fried or broiled and shrimp and potato and hush puppies had urgent bm / loose and loss of continence and dirtied up her clothes   druggist said it could be from metformin   this only happened once then and once yesterday  is overall better now -- back to normal  felt a little weak on saturday  told to drink plenty of fluids   no fever or abd pain no nausea or vomiting   Allergies: 1)  ! Augmentin 2)  * Ace Inhibitors Group 3)  Ibuprofen (Ibuprofen)  Past History:  Past Medical History: Last updated: 03/23/2008 GERD Hyperlipidemia Hypertension obesity MR hyperglycemia all rhinitis OA in R foot glaucoma   podiatry Dr Ralene Cork dentist Dr Ulysees Barns Dr Dione Booze derm-- Dr Terri Piedra  Past Surgical History: Last updated: 03/23/2008 Hysterectomy arm fx- past cataract surgery   Family History: Last updated: 02-28-2009 mother DM- deceased father - deceased  aunt DM- deceased , CVA, pacemaker   Social History: Last updated: 08/13/2007 lives alone- with help from neighbors currently- disability for arm fx non smoker no alcohol   Review of Systems General:  Denies fatigue, loss of appetite, and malaise. Eyes:  Denies blurring, eye irritation, and eye pain. CV:  Denies chest pain or discomfort, palpitations, and shortness of breath with exertion. Resp:  Denies cough, shortness of breath, and wheezing. GI:  Denies abdominal pain, bloody stools, dark tarry stools, indigestion,  loss of appetite, nausea, and vomiting. GU:  Denies dysuria. Derm:  Denies itching and rash. Psych:  Denies anxiety. Endo:  Denies cold intolerance and heat intolerance.  Physical Exam  General:  overweight but generally well appearing hygiene is fair  Head:  normocephalic, atraumatic, and no abnormalities observed.   Mouth:  pharynx pink and moist.   Neck:  supple with full rom and no masses or thyromegally, no JVD or carotid bruit  Lungs:  Normal respiratory effort, chest expands symmetrically. Lungs are clear to auscultation, no crackles or wheezes. Heart:  Normal rate and regular rhythm. S1 and S2 normal without gallop, murmur, click, rub or other extra sounds. Abdomen:  Bowel sounds positive,abdomen soft and non-tender without masses, organomegaly or hernias noted. no renal bruits  Skin:  Intact without suspicious lesions or rashes nl color and turgor  brisk cap refil time  Cervical Nodes:  No lymphadenopathy noted Inguinal Nodes:  No significant adenopathy Psych:  baseline affect- MR    Impression & Recommendations:  Problem # 1:  DIARRHEA (ICD-787.91) Assessment New 2 episodes - now resolved, with no other symptoms at all  doubt from the metformin  disc poss cause as virus or fatty meal  will watch closely- consider change metformin if it worsens  disc imp of fluid intake no s/s of dehydration today   Her updated medication list for this problem includes:  Pepto-bismol Max Strength 525 Mg/78ml Susp (Bismuth subsalicylate) ..... Otc as directed.  Complete Medication List: 1)  Desonide 0.05 % Crea (Desonide) 2)  Ketoconazole 2 % Crea (Ketoconazole) .... Apply 1 a small amount to affected area twice a day as needed 3)  Cartia Xt 240 Mg Cp24 (Diltiazem hcl coated beads) .Marland Kitchen.. 1 by mouth once daily 4)  Lipitor 20 Mg Tabs (Atorvastatin calcium) .... Take one by mouth daily 5)  Omeprazole 20 Mg Cpdr (Omeprazole) .Marland Kitchen.. 1 by mouth every am 6)  Hydrochlorothiazide 12.5 Mg  Tabs (Hydrochlorothiazide) .... Take 1 tablet by mouth once a day 7)  Klor-con M20 20 Meq Tbcr (Potassium chloride crys cr) .... Take 1 tablet by mouth once a day 8)  Calcium 500/d 500-200 Mg-unit Tabs (Calcium carbonate-vitamin d) .... Once daily 9)  Clarinex 5 Mg Tabs (Desloratadine) .Marland Kitchen.. 1 by mouth once daily as needed allergies 10)  Aspirin 81 Mg Tabs (Aspirin) .Marland Kitchen.. 1 by mouth once daily with food 11)  Nystatin 100000 Unit/gm Crea (Nystatin) .... Apply to rash under right arm and under breasts (red area) twice daily until better 12)  Metformin Hcl 500 Mg Tabs (Metformin hcl) .... Take one by mouth two times a day 13)  Triple Abt Oint  .... Apply twice a day to lesion until healed as needed 14)  Tessalon Perles 100 Mg Caps (Benzonatate) .Marland Kitchen.. 1 by mouth up to three times a day as needed cough - swallow whole 15)  Pepto-bismol Max Strength 525 Mg/51ml Susp (Bismuth subsalicylate) .... Otc as directed.  Patient Instructions: 1)  I think your diarrhea was from the fatty food you ate or a virus 2)  get plenty of fluids , sugar free jello is ok  3)  you can also try sugar free metamucil or citrucel (those are fiber supplements) 4)  if diarrhea does not improve by the end of the week or if stomach pain/ vomiting or fever -- please call   Current Allergies (reviewed today): ! AUGMENTIN * ACE INHIBITORS GROUP IBUPROFEN (IBUPROFEN)

## 2010-02-19 NOTE — Progress Notes (Signed)
Summary: Left leg pain.Marland Kitchen  Phone Note Call from Patient Call back at [phone]   Caller: Patient Call For: Judith Part MD Summary of Call: Pt called, says the upper part of her  left leg is hurting and would like to come in for an office visit. Says it could be related to her possibe UTI... Pt was in the office on yesterday.  Wanted to send a message to you before we scheduled.  [facility]... Please advise.Marland KitchenCyndee Giammarco  November 09, 2009 8:06 AM   Follow-up for Phone Call        it could more likely be lower back pain radiating to her leg (like a pinched nerve) I am waiting on her urine culture still  please ask how severe her pain is and if any numbness/ tingling/ weakness or any urinary sympotms  I do want to save her a visit if possible  Follow-up by: Judith Part MD,  November 09, 2009 9:28 AM  Additional Follow-up for Phone Call Additional follow up Details #1::        Patient notified as instructed by telephone. Pt said she has no numbness, tingling or weakness. Pt has noticed  frequency of urine and sometimes she does not get to bathroom before she has voided. Pain scale now is a 5. Pt said she did not want to have to go to ER this weekend.Please advise. Lewanda Rife LPN  November 09, 2009 10:55 AM     Additional Follow-up for Phone Call Additional follow up Details #2::    since she is now having urinary symptoms - will tx with short course of abx until lab returns  leg pain is probably from back -- will watch that update if worse px written on EMR for call in - cipro Follow-up by: Judith Part MD,  November 09, 2009 11:20 AM  Additional Follow-up for Phone Call Additional follow up Details #3:: Details for Additional Follow-up Action Taken: Advised pt, [facility] [address]. Additional Follow-up by: Lowella Petties CMA,  November 09, 2009 11:52 AM  New/Updated Medications: CIPRO 250 MG TABS (CIPROFLOXACIN HCL) 1 by mouth two times a day for  3 days Prescriptions: CIPRO 250 MG TABS (CIPROFLOXACIN HCL) 1 by mouth two times a day for 3 days  #6 x 0   Entered and Authorized by:   Judith Part MD   Signed by:   Lowella Petties CMA on 11/09/2009   Method used:   Telephoned to ...       [facility] [facility]ail)       [address]       [place], [place]  [address]       Ph: [phone] or [phone]       Fax: [phone]   RxID:   [id number]

## 2010-02-19 NOTE — Progress Notes (Signed)
Summary: regarding diarrhea  Phone Note Call from Patient Call back at Home Phone [phone]   Caller: Patient Call For: Judith Part MD Summary of Call: Pt has been taking metformin, 2 a day, since january.  She had a bout of diarrhea yesterday and thinks it was because of the medicine.  She has not had any problem with diarrhea until yesterday.  I told her it might not have been the medicine, it could have been something she ate or a virus.  She has not taken the metformin this morning, she is waiting for your opinion. Initial call taken by: Lowella Petties CMA,  May 18, 2009 8:11 AM  Follow-up for Phone Call        since diarrhea just started - most likely viral or food rel than metfomin ... so drink lots of fluids over weekend -- if worse call for sat clinic or go to UC  if other symptoms like abd pain or fever, update me  Follow-up by: Judith Part MD,  May 18, 2009 8:14 AM  Additional Follow-up for Phone Call Additional follow up Details #1::        Patient notified, she will update if sxs worsen.  Additional Follow-up by: Melody Comas,  May 18, 2009 10:13 AM

## 2010-02-19 NOTE — Progress Notes (Signed)
Summary: wants diflucan  Phone Note Refill Request Call back at Home Phone [phone] Message from:  Patient  Refills Requested: Medication #1:  diflucan Pt thinks she has a yeast infection and is asking that diflucan be [facility].  She went to the drug store last night and explained her sxs to the pharmacist, who told her she needs diflucan.  Initial call taken by: Lowella Petties CMA, AAMA,  December 25, 2009 10:29 AM  Follow-up for Phone Call        do let me know what symptoms are f/u if not imp after diflucan-- give it 4-5 days to work  hold lipitor for 3 days after taking the diflucan- they can interact Follow-up by: Judith Part MD,  December 25, 2009 11:04 AM  Additional Follow-up for Phone Call Additional follow up Details #1::        She can only explain itching as a symptom.   Lowella Petties CMA, AAMA  December 25, 2009 11:32 AM     Additional Follow-up for Phone Call Additional follow up Details #2::    [facility]. No answer or v/m at pt's home. Will try later.Lewanda Rife LPN  December 25, 2009 1:25 PM   Patient notified as instructed by telephone. Lewanda Rife LPN  December 25, 2009 1:33 PM   New/Updated Medications: FLUCONAZOLE 150 MG TABS (FLUCONAZOLE) 1 by mouth times one for yeast infection Prescriptions: FLUCONAZOLE 150 MG TABS (FLUCONAZOLE) 1 by mouth times one for yeast infection  #1 x 0   Entered by:   Lewanda Rife LPN   Authorized by:   Judith Part MD   Signed by:   Lewanda Rife LPN on 09/81/1914   Method used:   Electronically to        [facility] [facility]retail)       [address]       [place], [place]  [address]       Ph: [phone] or [phone]       Fax: [phone]   RxID:   [id number]

## 2010-02-19 NOTE — Letter (Signed)
Summary: [facility]  [facility]   Imported By: Lanelle Bal 11/20/2009 15:45:05  _____________________________________________________________________  External Attachment:    Type:   Image     Comment:   External Document  Appended Document: [facility]    Clinical Lists Changes  Observations: Added new observation of DMEYEEXAMNXT: 11/2010 (11/21/2009 8:25) Added new observation of DMEYEEXMRES: normal (11/06/2009 8:25) Added new observation of EYE EXAM BY: Dr Dione Booze (11/06/2009 8:25) Added new observation of DIAB EYE EX: normal (11/06/2009 8:25)        Diabetes Management Exam:    Eye Exam:       Eye Exam done elsewhere          Date: 11/06/2009          Results: normal          Done by: Dr Dione Booze

## 2010-02-19 NOTE — Progress Notes (Signed)
Summary: Rx Ketoconazole 2% Shampoo  Phone Note Refill Request Call back at [phone] Message from:  [facility] on January 25, 2009 8:27 AM  Refills Requested: Medication #1:  KETOCONAZOLE 2 % CREA Apply 1 a small amount to affected area twice a day as needed   Last Refilled: 12/16/2008 Received faxed refill request, please advise   Method Requested: Electronic Initial call taken by: Linde Gillis CMA Duncan Dull),  January 25, 2009 8:28 AM  Follow-up for Phone Call        px written on EMR for call in  Follow-up by: Judith Part MD,  January 25, 2009 9:53 AM  Additional Follow-up for Phone Call Additional follow up Details #1::        Sent electronically. Additional Follow-up by: Lowella Petties CMA,  January 25, 2009 9:55 AM    Prescriptions: KETOCONAZOLE 2 %  SHAM (KETOCONAZOLE) use as directed twice weekly prn  #120 ml x 2   Entered by:   Lowella Petties CMA   Authorized by:   Judith Part MD   Signed by:   Lowella Petties CMA on 01/25/2009   Method used:   Electronically [facility]        [facility] (retail)       [address]       [place], [place]  [address]       Ph: [phone] or [phone]       Fax: [phone]   RxID:   [id number]

## 2010-02-19 NOTE — Assessment & Plan Note (Signed)
Summary: BURN/ITCH WHEN URINATE/DLO   Vital Signs:  Patient profile:   55 year old female Height:      62 inches Weight:      203.75 pounds BMI:     37.40 Temp:     97.5 degrees F oral Pulse rate:   76 / minute Pulse rhythm:   regular BP sitting:   114 / 80  (left arm) Cuff size:   large  Vitals Entered By: Lewanda Rife LPN (October 25, 2009 9:26 AM) CC: burn and itch when urinates   History of Present Illness: sometimes when she urinates it itches  no vaginal discharge tries not to scratch   some increased frequency   no illness/ fever/ nausea no new back pain   no blood in urine     Allergies: 1)  ! Augmentin 2)  * Ace Inhibitors Group 3)  Ibuprofen (Ibuprofen)  Past History:  Past Medical History: Last updated: 10/02/2009 GERD Hyperlipidemia Hypertension obesity MR hyperglycemia all rhinitis OA in R foot glaucoma chronic low back pain with radiculopathy (deg disk)  podiatry Dr Ralene Cork dentist Dr Ulysees Barns Dr Dione Booze derm-- Dr Terri Piedra ortho [facility]hel[facility]  Past Surgical History: Last updated: 03/23/2008 Hysterectomy arm fx- past cataract surgery   Family History: Last updated: February 18, 2009 mother DM- deceased father - deceased  aunt DM- deceased , CVA, pacemaker   Social History: Last updated: 08/13/2007 lives alone- with help from neighbors currently- disability for arm fx non smoker no alcohol   Review of Systems General:  Denies chills, fatigue, fever, and loss of appetite. Eyes:  Denies blurring and eye irritation. CV:  Denies chest pain or discomfort and palpitations. Resp:  Denies cough. GI:  Denies abdominal pain, bloody stools, change in bowel habits, indigestion, nausea, and vomiting. GU:  Complains of dysuria and urinary frequency; denies discharge. Derm:  Complains of itching; denies rash.  Physical Exam  General:  overwt and fatigued appearing Head:  normocephalic, atraumatic, and no abnormalities observed.  no sinus  tenderness Mouth:  pharynx pink and moist.   Neck:  No deformities, masses, or tenderness noted. Lungs:  Normal respiratory effort, chest expands symmetrically. Lungs are clear to auscultation, no crackles or wheezes. Heart:  Normal rate and regular rhythm. S1 and S2 normal without gallop, murmur, click, rub or other extra sounds. Abdomen:  no suprapubic tenderness or fullness felt  Genitalia:  mild hyperemia of labia minora  no excoriations  scant white d/c Msk:  no CVA tenderness  Skin:  see gu exam no rash  Cervical Nodes:  No lymphadenopathy noted Inguinal Nodes:  No significant adenopathy Psych:  normal affect, talkative and pleasant - baseline MR    Impression & Recommendations:  Problem # 1:  VAGINITIS, CANDIDAL (ICD-112.1) Assessment New  causing vaginal itch and some burning on urination  pt is diabetic and was recently on abx tx with diflucan and update  Her updated medication list for this problem includes:    Ketoconazole 2 % Crea (Ketoconazole) .Marland Kitchen... Apply 1 a small amount to affected area twice a day as needed    Nystatin 100000 Unit/gm Crea (Nystatin) .Marland Kitchen... Apply to rash under right arm and under breasts (red area) twice daily until better    Diflucan 150 Mg Tabs (Fluconazole) .Marland Kitchen... 1 by mouth once daily times one for yeast infection  Orders: UA Dipstick W/ Micro (manual) (04540) Wet Prep ([id number]) Prescription Created Electronically [phone])  Complete Medication List: 1)  Desonide 0.05 % Crea (Desonide) 2)  Ketoconazole  2 % Crea (Ketoconazole) .... Apply 1 a small amount to affected area twice a day as needed 3)  Cartia Xt 240 Mg Cp24 (Diltiazem hcl coated beads) .Marland Kitchen.. 1 by mouth once daily 4)  Lipitor 20 Mg Tabs (Atorvastatin calcium) .... Take one by mouth daily 5)  Omeprazole 20 Mg Cpdr (Omeprazole) .Marland Kitchen.. 1 by mouth every am 6)  Hydrochlorothiazide 12.5 Mg Tabs (Hydrochlorothiazide) .... Take 1 tablet by mouth once a day 7)  Klor-con M20 20 Meq Tbcr  (Potassium chloride crys cr) .... Take 1 tablet by mouth once a day 8)  Calcium 500/d 500-200 Mg-unit Tabs (Calcium carbonate-vitamin d) .... Once daily 9)  Clarinex 5 Mg Tabs (Desloratadine) .Marland Kitchen.. 1 by mouth once daily as needed allergies 10)  Aspirin 81 Mg Tabs (Aspirin) .Marland Kitchen.. 1 by mouth once daily with food 11)  Nystatin 100000 Unit/gm Crea (Nystatin) .... Apply to rash under right arm and under breasts (red area) twice daily until better 12)  Metformin Hcl 500 Mg Tabs (Metformin hcl) .... Take one by mouth two times a day 13)  Triple Abt Oint  .... Apply twice a day to lesion until healed as needed 14)  Tessalon Perles 100 Mg Caps (Benzonatate) .Marland Kitchen.. 1 by mouth up to three times a day as needed cough - swallow whole 15)  Pepto-bismol Max Strength 525 Mg/49ml Susp (Bismuth subsalicylate) .... Otc as directed. 16)  Metamucil 30.9 % Powd (Psyllium) .... Otc as directed. 17)  Flonase 50 Mcg/act Susp (Fluticasone propionate) .Marland Kitchen.. 1 spray in each nostril once daily for 2 weeks for ear problem  stop it if you get a nosebleed 18)  Nitrofurantoin Macrocrystal 100 Mg Caps (Nitrofurantoin macrocrystal) .... Take one capsule twice a day for 7 days 19)  Probiotic Caps (Probiotic product) .... Take 1 capsule by mouth once a day 20)  Loperamide Hcl 2 Mg Caps (Loperamide hcl) .... Take two caplets after 1st loose stool then 1 captlet after each subsequent loose stool, not to take more than 4 caplets in 24 hr period. 21)  Ventolin Hfa 108 (90 Base) Mcg/act Aers (Albuterol sulfate) .... Take 2 puffs by mouth every 4 hours as needed for shortness of breath or cough 22)  Tussionex Pennkinetic Er 10-8 Mg/77ml Lqcr (Hydrocod polst-chlorphen polst) .... Take 1/2 teaspoon by mouth every 12 hours as needed cough 23)  Diflucan 150 Mg Tabs (Fluconazole) .Marland Kitchen.. 1 by mouth once daily times one for yeast infection  Patient Instructions: 1)  take the diflucan pill one time orally  2)  this should help vaginal yeast infection 3)   if not improved (or if worse ) next week - please let me know  4)  I sent medicine to your pharmacy  Prescriptions: DIFLUCAN 150 MG TABS (FLUCONAZOLE) 1 by mouth once daily times one for yeast infection  #1 x 0   Entered and Authorized by:   Judith Part MD   Signed by:   Judith Part MD on 10/25/2009   Method used:   Electronically to        [facility]  [address] (retail)       [address]       [place], [place]  [address]       Ph: [phone] or [phone]       Fax: [phone]   RxID:   [id number]   Current Allergies (reviewed today): ! AUGMENTIN * ACE INHIBITORS GROUP IBUPROFEN (IBUPROFEN)  Laboratory Results   Urine Tests  Date/Time Received: October 25, 2009 9:27 AM  Date/Time Reported: October 25, 2009 9:28 AM   Routine Urinalysis   Color: yellow Appearance: slightly hazy Glucose: negative   (Normal Range: Negative) Bilirubin: negative   (Normal Range: Negative) Ketone: negative   (Normal Range: Negative) Spec. Gravity: 1.010   (Normal Range: 1.003-1.035) Blood: trace-lysed   (Normal Range: Negative) pH: 6.0   (Normal Range: 5.0-8.0) Protein: negative   (Normal Range: Negative) Urobilinogen: 0.2   (Normal Range: 0-1) Nitrite: negative   (Normal Range: Negative) Leukocyte Esterace: trace   (Normal Range: Negative)  Urine Microscopic WBC/HPF: 0-1 RBC/HPF: 0 Bacteria/HPF: 0 Mucous/HPF: 0 Epithelial/HPF: 0-1 Crystals/HPF: 0 Casts/LPF: 0 Yeast/HPF: 0 Other: 0      Wet Mount Source: vaginal WBC/hpf: 1-5 Bacteria/hpf: rare  Rods Clue cells/hpf: none  Negative whiff Yeast/hpf: few Wet Mount KOH: Negative Trichomonas/hpf: none    Laboratory Results   Urine Tests    Routine Urinalysis   Color: yellow Appearance: slightly hazy Glucose: negative   (Normal Range: Negative) Bilirubin: negative   (Normal Range: Negative) Ketone: negative   (Normal Range: Negative) Spec. Gravity: 1.010   (Normal Range:  1.003-1.035) Blood: trace-lysed   (Normal Range: Negative) pH: 6.0   (Normal Range: 5.0-8.0) Protein: negative   (Normal Range: Negative) Urobilinogen: 0.2   (Normal Range: 0-1) Nitrite: negative   (Normal Range: Negative) Leukocyte Esterace: trace   (Normal Range: Negative)  Urine Microscopic WBC/hpf: 0-1 RBC/hpf: 0 Bacteria: 0 Mucous: 0 Epithelial: 0-1 Crystals/LPF: 0 Casts/LPF: 0 Yeast/HPF: 0 Other: 0      Wet Mount/KOH Source: vaginal WBC/hpf 5-10 Bacteria/hpf rare  Rods Clue cells/hpf none  Negative whiff Yeast/hpf few KOH Negative Trichomonas/hpf none

## 2010-02-19 NOTE — Assessment & Plan Note (Signed)
Summary: 5 M F/U DLO   Vital Signs:  Patient profile:   55 year old female Weight:      225 pounds BMI:     41.30 Temp:     97.8 degrees F oral Pulse rate:   80 / minute Pulse rhythm:   regular BP sitting:   122 / 80  (left arm) Cuff size:   large  Vitals Entered By: Lowella Petties CMA (03/02/09 8:55 AM) CC: 6 month follow uo   History of Present Illness: here for f/u of HTN and DM and lipids she is generally feeling allright - no complaints   needs some px refils today   wt is up 1 lb is walking for exercise- hard to do with the bad weather-- so does some in the house when she can  is afraid of falling on the ice  is eating DM diet- and staying away from sugar  breakfast - eggs / low fat bacon / coffee  lunch - broccoli with cheese , or spaghetti with meatballs (then eats left overs for supper)  does try to fit in some vegetables -- celery  some bananas  no sweets at all - occ no sugar added cookies   no inc thirst or urination no numbness in feet no fatigue    bp is good 122/80- has been in good control   DM was doing better at last ceck with AIC of 6.6 and nl microalb  had flu shot pneumonia vaccine is due now- has been 7 years       Allergies: 1)  ! Augmentin 2)  * Ace Inhibitors Group 3)  Ibuprofen (Ibuprofen)  Past History:  Past Medical History: Last updated: 03/23/2008 GERD Hyperlipidemia Hypertension obesity MR hyperglycemia all rhinitis OA in R foot glaucoma   podiatry Dr Ralene Cork dentist Dr Ulysees Barns Dr Dione Booze derm-- Dr Terri Piedra  Past Surgical History: Last updated: 03/23/2008 Hysterectomy arm fx- past cataract surgery   Family History: Last updated: March 02, 2009 mother DM- deceased father - deceased  aunt DM- deceased , CVA, pacemaker   Social History: Last updated: 08/13/2007 lives alone- with help from neighbors currently- disability for arm fx non smoker no alcohol   Family History: mother DM-  deceased father - deceased  aunt DM- deceased , CVA, pacemaker   Review of Systems General:  Denies fatigue, loss of appetite, and malaise. Eyes:  Denies blurring and eye pain. CV:  Denies chest pain or discomfort, lightheadness, and palpitations. Resp:  Denies cough, shortness of breath, and wheezing. GI:  Denies abdominal pain, change in bowel habits, and indigestion. GU:  Denies urinary frequency. MS:  Denies muscle aches. Derm:  Denies lesion(s), poor wound healing, and rash. Neuro:  Denies numbness and tingling. Endo:  Denies cold intolerance, excessive thirst, excessive urination, heat intolerance, and polyuria. Heme:  Denies abnormal bruising and bleeding.  Physical Exam  General:  overweight but generally well appearing hygiene is fair  Head:  normocephalic, atraumatic, and no abnormalities observed.   Eyes:  vision grossly intact, pupils equal, pupils round, and pupils reactive to light.   Mouth:  pharynx pink and moist.   Neck:  supple with full rom and no masses or thyromegally, no JVD or carotid bruit  Lungs:  Normal respiratory effort, chest expands symmetrically. Lungs are clear to auscultation, no crackles or wheezes. Heart:  Normal rate and regular rhythm. S1 and S2 normal without gallop, murmur, click, rub or other extra sounds. Abdomen:  no renal bruits  Msk:  No deformity or scoliosis noted of thoracic or lumbar spine.   Pulses:  R and L carotid,radial,femoral,dorsalis pedis and posterior tibial pulses are full and equal bilaterally Extremities:  No clubbing, cyanosis, edema, or deformity noted with normal full range of motion of all joints.   Neurologic:  sensation intact to light touch, gait normal, and DTRs symmetrical and normal.   Skin:  very dry skin on feet without cracking  Cervical Nodes:  No lymphadenopathy noted Psych:  normally interactive.     Impression & Recommendations:  Problem # 1:  DIABETES MELLITUS, TYPE II (ICD-250.00) Assessment  Improved  overall has been better controlled with metformin  rev diet- needs more green veg  disc healthy diet (low simple sugar/ choose complex carbs/ low sat fat) diet and exercise in detail -- this is difficult in light of MR opthy up to date  counseled on above  lab today  next f/u 6 mo  Her updated medication list for this problem includes:    Aspirin 81 Mg Tabs (Aspirin) .Marland Kitchen... 1 by mouth once daily with food    Metformin Hcl 500 Mg Tabs (Metformin hcl) .Marland Kitchen... 1/2 by mouth two times a day - with breakfast and supper  Orders: Venipuncture ([address]) TLB-Lipid Panel (80061-LIPID) TLB-Renal Function Panel (80069-RENAL) TLB-A1C / Hgb A1C (Glycohemoglobin) (83036-A1C)  Labs Reviewed: Creat: 0.7 (10/12/2008)     Last Eye Exam: normal (11/14/2008) Reviewed HgBA1c results: 6.6 (10/12/2008)  7.2 (06/29/2008)  Problem # 2:  DRY SKIN (ICD-701.1) Assessment: Deteriorated urged to use lotion / moisturizer on feet daily   Problem # 3:  HYPERTENSION (ICD-401.9) Assessment: Unchanged  good control on current meds pt is ace intol enc more exercise  lab today f/u 6 m  Her updated medication list for this problem includes:    Cartia Xt 240 Mg Cp24 (Diltiazem hcl coated beads) .Marland Kitchen... 1 by mouth once daily    Hydrochlorothiazide 12.5 Mg Tabs (Hydrochlorothiazide) .Marland Kitchen... Take 1 tablet by mouth once a day  Orders: Venipuncture (981[address]) TLB-Lipid Panel (80061-LIPID) TLB-Renal Function Panel (80069-RENAL)  BP today: 122/80 Prior BP: 124/76 (01/24/2009)  Labs Reviewed: K+: 4.5 (10/12/2008) Creat: : 0.7 (10/12/2008)   Chol: 139 (10/12/2008)   HDL: 32.60 (10/12/2008)   LDL: 88 (10/12/2008)   TG: 94.0 (10/12/2008)  Problem # 4:  HYPERLIPIDEMIA (ICD-272.4) Assessment: Unchanged  has been fair -- with LDL in 80s disc low sat fat diet  hesitant to inc lipitor in light of tranaminases/ checking today Her updated medication list for this problem includes:    Lipitor 10 Mg Tabs (Atorvastatin  calcium) .Marland Kitchen... 1 by mouth once daily  Orders: Venipuncture ([address]782) TLB-Lipid Panel (80061-LIPID) TLB-Renal Function Panel (80069-RENAL)  Labs Reviewed: SGOT: 31 (10/12/2008)   SGPT: 41 (10/12/2008)   HDL:32.60 (10/12/2008), 31.7 (03/20/2008)  LDL:88 (10/12/2008), 106 (95/62/1308)  Chol:139 (10/12/2008), 173 (03/20/2008)  Trig:94.0 (10/12/2008), 177 (03/20/2008)  Complete Medication List: 1)  Desonide 0.05 % Crea (Desonide) 2)  Ketoconazole 2 % Crea (Ketoconazole) .... Apply 1 a small amount to affected area twice a day as needed 3)  Cartia Xt 240 Mg Cp24 (Diltiazem hcl coated beads) .Marland Kitchen.. 1 by mouth once daily 4)  Lipitor 10 Mg Tabs (Atorvastatin calcium) .Marland Kitchen.. 1 by mouth once daily 5)  Omeprazole 20 Mg Cpdr (Omeprazole) .Marland Kitchen.. 1 by mouth every am 6)  Hydrochlorothiazide 12.5 Mg Tabs (Hydrochlorothiazide) .... Take 1 tablet by mouth once a day 7)  Klor-con M20 20 Meq Tbcr (Potassium chloride crys cr) .... Take 1 tablet  by mouth once a day 8)  Calcium 500/d 500-200 Mg-unit Tabs (Calcium carbonate-vitamin d) .... Once daily 9)  Ketoconazole 2 % Sham (Ketoconazole) .... Use as directed twice weekly prn 10)  Clarinex 5 Mg Tabs (Desloratadine) .Marland Kitchen.. 1 by mouth once daily as needed allergies 11)  Aspirin 81 Mg Tabs (Aspirin) .Marland Kitchen.. 1 by mouth once daily with food 12)  Nystatin 100000 Unit/gm Crea (Nystatin) .... Apply to rash under right arm and under breasts (red area) twice daily until better 13)  Metformin Hcl 500 Mg Tabs (Metformin hcl) .... 1/2 by mouth two times a day - with breakfast and supper 14)  Triple Abt Oint  .... Apply twice a day to lesion until healed  Other Orders: TLB-Hepatic/Liver Function Pnl (80076-HEPATIC) Pneumococcal Vaccine ([address]) Admin 1st Vaccine (60454) Admin 1st Vaccine Horticulturist, commercial) [phone])  Patient Instructions: 1)  take your prescriptions to pharmacy to file 2)  keep working on healthy diet and exercise and weight loss 3)  labs today  4)  schedule next fasting  labs in 6 months lipid/hepatic/ AIC / microalb/ and renal 250.0, 272  5)  pneumonia vaccine today Prescriptions: METFORMIN HCL 500 MG TABS (METFORMIN HCL) 1/2 by mouth two times a day - with breakfast and supper  #30 x 11   Entered and Authorized by:   Judith Part MD   Signed by:   Judith Part MD on 02/08/2009   Method used:   Print then Give to Patient   RxID:   [id number] CLARINEX 5 MG  TABS (DESLORATADINE) 1 by mouth once daily as needed allergies  #30 x 11   Entered and Authorized by:   Judith Part MD   Signed by:   Judith Part MD on 02/08/2009   Method used:   Print then Give to Patient   RxID:   [id number] KLOR-CON M20 20 MEQ  TBCR (POTASSIUM CHLORIDE CRYS CR) Take 1 tablet by mouth once a day  #30 x 11   Entered and Authorized by:   Judith Part MD   Signed by:   Judith Part MD on 02/08/2009   Method used:   Print then Give to Patient   RxID:   [id number] HYDROCHLOROTHIAZIDE 12.5 MG  TABS (HYDROCHLOROTHIAZIDE) Take 1 tablet by mouth once a day  #30 x 11   Entered and Authorized by:   Judith Part MD   Signed by:   Judith Part MD on 02/08/2009   Method used:   Print then Give to Patient   RxID:   [id number] OMEPRAZOLE 20 MG  CPDR (OMEPRAZOLE) 1 by mouth every am  #30 x 11   Entered and Authorized by:   Judith Part MD   Signed by:   Judith Part MD on 02/08/2009   Method used:   Print then Give to Patient   RxID:   [id number] LIPITOR 10 MG TABS (ATORVASTATIN CALCIUM) 1 by mouth once daily  #30 x 11   Entered and Authorized by:   Judith Part MD   Signed by:   Judith Part MD on 02/08/2009   Method used:   Print then Give to Patient   RxID:   [id number] CARTIA XT 240 MG  CP24 (DILTIAZEM HCL COATED BEADS) 1 by mouth once daily  #30 x 11   Entered and Authorized by:   Judith Part MD   Signed by:   Judith Part MD on 02/08/2009   Method used:  Print then Give to Patient   RxID:    [id number]   Prior Medications (reviewed today): DESONIDE 0.05 % CREA (DESONIDE)  KETOCONAZOLE 2 % CREA (KETOCONAZOLE) Apply 1 a small amount to affected area twice a day as needed CARTIA XT 240 MG  CP24 (DILTIAZEM HCL COATED BEADS) 1 by mouth once daily LIPITOR 10 MG TABS (ATORVASTATIN CALCIUM) 1 by mouth once daily OMEPRAZOLE 20 MG  CPDR (OMEPRAZOLE) 1 by mouth every am CALCIUM 500/D 500-200 MG-UNIT  TABS (CALCIUM CARBONATE-VITAMIN D) once daily KETOCONAZOLE 2 %  SHAM (KETOCONAZOLE) use as directed twice weekly prn ASPIRIN 81 MG TABS (ASPIRIN) 1 by mouth once daily with food NYSTATIN 100000 UNIT/GM CREA (NYSTATIN) apply to rash under right arm and under breasts (red area) twice daily until better TRIPLE ABT OINT () apply twice a day to lesion until healed Current Allergies: ! AUGMENTIN * ACE INHIBITORS GROUP IBUPROFEN (IBUPROFEN)   Pneumovax Vaccine    Vaccine Type: Pneumovax    Site: left deltoid    Mfr: Merck    Dose: 0.5 ml    Route: IM    Given by: Lowella Petties CMA    Exp. Date: 02/15/2010    Lot #: [id number]    VIS given: 08/18/95 version given February 08, 2009.   Prevention & Chronic Care Immunizations   Influenza vaccine: Fluvax 3+  (10/19/2008)    Tetanus booster: 03/10/2002: Td    Pneumococcal vaccine: Pneumovax  (02/08/2009)  Colorectal Screening   Hemoccult: Not documented    Colonoscopy: Not documented  Other Screening   Pap smear: Normal  (04/20/2004)    Mammogram: normal  (03/16/2008)   Mammogram due: 03/2008   Smoking status: Not documented  Diabetes Mellitus   HgbA1C: 6.6  (10/12/2008)    Eye exam: normal  (11/14/2008)   Eye exam due: 11/2009    Foot exam: yes  (08/07/2008)   High risk foot: Not documented   Foot care education: Not documented    Urine microalbumin/creatinine ratio: 7.0  (10/12/2008)  Lipids   Total Cholesterol: 139  (10/12/2008)   LDL: 88  (10/12/2008)   LDL Direct: Not documented   HDL: 32.60  (10/12/2008)    Triglycerides: 94.0  (10/12/2008)    SGOT (AST): 31  (10/12/2008)   SGPT (ALT): 41  (10/12/2008)   Alkaline phosphatase: 118  (06/29/2008)   Total bilirubin: 0.5  (06/29/2008)  Hypertension   Last Blood Pressure: 122 / 80  (02/08/2009)   Serum creatinine: 0.7  (10/12/2008)   Serum potassium 4.5  (10/12/2008)  Self-Management Support :    Diabetes self-management support: Not documented    Hypertension self-management support: Not documented    Lipid self-management support: Not documented

## 2010-02-19 NOTE — Letter (Signed)
Summary: Results Follow up Letter  [place] [facility]  [address] [place], [place] [address]   Phone: [phone]  Fax: [phone]    03/22/2009 MRN: [id number]    Linda Thompson [address] [place], [place]  [address]    Dear Ms. Lammert,  The following are the results of your recent test(s):  Test         Result    Pap Smear:        Normal _____  Not Normal _____ Comments: ______________________________________________________ Cholesterol: LDL(Bad cholesterol):         Your goal is less than:         HDL (Good cholesterol):       Your goal is more than: Comments:  ______________________________________________________ Mammogram:        Normal __X___  Not Normal _____ Comments:  Yearly follow up is recommended.   ___________________________________________________________________ Hemoccult:        Normal _____  Not normal _______ Comments:    _____________________________________________________________________ Other Tests:    We routinely do not discuss normal results over the telephone.  If you desire a copy of the results, or you have any questions about this information we can discuss them at your next office visit.   Sincerely,    Marne A. Milinda Antis, M.D.  MAT:lsf

## 2010-02-19 NOTE — Assessment & Plan Note (Signed)
Summary: F/U/CLE   Vital Signs:  Patient profile:   55 year old female Height:      62 inches Weight:      220 pounds BMI:     40.38 Temp:     97.6 degrees F oral Pulse rate:   92 / minute Pulse rhythm:   regular BP sitting:   112 / 76  (left arm) Cuff size:   large  Vitals Entered By: Lewanda Rife LPN (May 28, 1608 11:10 AM) CC: follow-up visit for diarrhea   History of Present Illness: is still having some diarrhea  at least 3-4 times per day  is really watery  no abdominal pain  no n/v no fever  diet has not really changed  staying away from grease  is taking her fiber supple    went to ER for L leg and back pain recently was given some pain med -- she held her px for hydrocodone  got a urine sample  checked her all over incl ekg - but no x rays  leg is feeling better now  took on 500 mg tylenol - and that helped   Allergies: 1)  ! Augmentin 2)  * Ace Inhibitors Group 3)  Ibuprofen (Ibuprofen)  Past History:  Past Medical History: Last updated: 03/23/2008 GERD Hyperlipidemia Hypertension obesity MR hyperglycemia all rhinitis OA in R foot glaucoma   podiatry Dr Ralene Cork dentist Dr Ulysees Barns Dr Dione Booze derm-- Dr Terri Piedra  Past Surgical History: Last updated: 03/23/2008 Hysterectomy arm fx- past cataract surgery   Family History: Last updated: Mar 08, 2009 mother DM- deceased father - deceased  aunt DM- deceased , CVA, pacemaker   Social History: Last updated: 08/13/2007 lives alone- with help from neighbors currently- disability for arm fx non smoker no alcohol   Review of Systems General:  Denies chills, fatigue, fever, loss of appetite, and malaise. Eyes:  Denies blurring and eye irritation. CV:  Denies chest pain or discomfort and palpitations. Resp:  Denies cough, shortness of breath, sputum productive, and wheezing. GI:  Complains of change in bowel habits and diarrhea; denies abdominal pain, bloody stools, gas, hemorrhoids,  indigestion, loss of appetite, nausea, and vomiting. GU:  Denies dysuria, hematuria, and urinary frequency. MS:  Denies joint pain. Derm:  Denies itching, lesion(s), poor wound healing, and rash. Neuro:  Denies headaches and poor balance. Psych:  no stress or anxiety. Heme:  Denies abnormal bruising and bleeding.  Physical Exam  General:  overweight but generally well appearing hygiene is fair  Head:  normocephalic, atraumatic, and no abnormalities observed.   Eyes:  vision grossly intact, pupils equal, pupils round, pupils reactive to light, and no injection.   Mouth:  MMM Neck:  supple with full rom and no masses or thyromegally, no JVD or carotid bruit  Lungs:  Normal respiratory effort, chest expands symmetrically. Lungs are clear to auscultation, no crackles or wheezes. Heart:  Normal rate and regular rhythm. S1 and S2 normal without gallop, murmur, click, rub or other extra sounds. Abdomen:  Bowel sounds positive,abdomen soft and non-tender without masses, organomegaly or hernias noted. abd is obese no renal bruits  Skin:  Intact without suspicious lesions or rashes no pallor or jaundice brisk cap refil time Cervical Nodes:  No lymphadenopathy noted Inguinal Nodes:  No significant adenopathy Psych:  baseline affect- MR    Impression & Recommendations:  Problem # 1:  DIARRHEA (ICD-787.91) Assessment Unchanged ongoing with painless loose stool three times a day that is sometimes urgent no  abd pain or other symptoms ? if ibs/ rxn to metformin (unlikely has been on for a while), or infectious lab and stool studies today  if all neg - consider trial of metformin hold and consider colonosc/ gi workup Her updated medication list for this problem includes:    Pepto-bismol Max Strength 525 Mg/49ml Susp (Bismuth subsalicylate) ..... Otc as directed.  Orders: Venipuncture (78295) TLB-CBC Platelet - w/Differential (85025-CBCD) TLB-BMP (Basic Metabolic Panel-BMET)  (80048-METABOL) TLB-TSH (Thyroid Stimulating Hormone) (84443-TSH) T-Culture, C-Diff Toxin A/B (62130-86578) T-Culture, Stool (87045/87046-70140) T-Stool Giardia / Crypto- EIA (46962)  Complete Medication List: 1)  Desonide 0.05 % Crea (Desonide) 2)  Ketoconazole 2 % Crea (Ketoconazole) .... Apply 1 a small amount to affected area twice a day as needed 3)  Cartia Xt 240 Mg Cp24 (Diltiazem hcl coated beads) .Marland Kitchen.. 1 by mouth once daily 4)  Lipitor 20 Mg Tabs (Atorvastatin calcium) .... Take one by mouth daily 5)  Omeprazole 20 Mg Cpdr (Omeprazole) .Marland Kitchen.. 1 by mouth every am 6)  Hydrochlorothiazide 12.5 Mg Tabs (Hydrochlorothiazide) .... Take 1 tablet by mouth once a day 7)  Klor-con M20 20 Meq Tbcr (Potassium chloride crys cr) .... Take 1 tablet by mouth once a day 8)  Calcium 500/d 500-200 Mg-unit Tabs (Calcium carbonate-vitamin d) .... Once daily 9)  Clarinex 5 Mg Tabs (Desloratadine) .Marland Kitchen.. 1 by mouth once daily as needed allergies 10)  Aspirin 81 Mg Tabs (Aspirin) .Marland Kitchen.. 1 by mouth once daily with food 11)  Nystatin 100000 Unit/gm Crea (Nystatin) .... Apply to rash under right arm and under breasts (red area) twice daily until better 12)  Metformin Hcl 500 Mg Tabs (Metformin hcl) .... Take one by mouth two times a day 13)  Triple Abt Oint  .... Apply twice a day to lesion until healed as needed 14)  Tessalon Perles 100 Mg Caps (Benzonatate) .Marland Kitchen.. 1 by mouth up to three times a day as needed cough - swallow whole 15)  Pepto-bismol Max Strength 525 Mg/20ml Susp (Bismuth subsalicylate) .... Otc as directed. 16)  Metamucil 30.9 % Powd (Psyllium) .... Otc as directed.  Patient Instructions: 1)  continue your fiber supplement 2)  keep up good fluid intake 3)  labs and stool studies today  4)  then I will make a plan when those come back  5)  let me know if diarrhea gets worse  Current Allergies (reviewed today): ! AUGMENTIN * ACE INHIBITORS GROUP IBUPROFEN (IBUPROFEN)

## 2010-02-19 NOTE — Assessment & Plan Note (Signed)
Summary: SINUS/DLO   Vital Signs:  Patient profile:   55 year old female Height:      62 inches Weight:      201 pounds BMI:     36.90 Temp:     97.6 degrees F oral Pulse rate:   88 / minute Pulse rhythm:   regular BP sitting:   120 / 84  (left arm) Cuff size:   large  Vitals Entered By: Lewanda Rife LPN (November 01, 2009 8:15 AM) CC: sinus, sneezing and non productive cough   History of Present Illness: here with sinus problems  is sneezing with runny nose and now having productive cough  ? a little bronchitis  she went to the urgent care at [facility]  no wheezing    she [place] and needs samples if we have them seeing if we have omeprazole   Allergies: 1)  ! Augmentin 2)  * Ace Inhibitors Group 3)  Ibuprofen (Ibuprofen)  Past History:  Past Medical History: Last updated: 10/02/2009 GERD Hyperlipidemia Hypertension obesity MR hyperglycemia all rhinitis OA in R foot glaucoma chronic low back pain with radiculopathy (deg disk)  podiatry Dr Ralene Cork dentist Dr Ulysees Barns Dr Dione Booze derm-- Dr Terri Piedra ortho [facility]  Past Surgical History: Last updated: 03/23/2008 Hysterectomy arm fx- past cataract surgery   Family History: Last updated: 03/04/2009 mother DM- deceased father - deceased  aunt DM- deceased , CVA, pacemaker   Social History: Last updated: 08/13/2007 lives alone- with help from neighbors currently- disability for arm fx non smoker no alcohol   Review of Systems General:  Complains of fatigue; denies chills, fever, loss of appetite, and malaise. Eyes:  Denies blurring and eye irritation. ENT:  Complains of nasal congestion, postnasal drainage, and sore throat; denies sinus pressure. CV:  Denies chest pain or discomfort, lightheadness, and palpitations. Resp:  Complains of cough and sputum productive; denies pleuritic, shortness of breath, and wheezing. GI:  Denies abdominal pain, change in bowel habits, diarrhea, nausea,  and vomiting. GU:  Denies dysuria. Derm:  Denies rash.  Physical Exam  General:  overwt and fatigued appearing Head:  normocephalic, atraumatic, and no abnormalities observed.  no sinus tenderness Eyes:  vision grossly intact, pupils equal, pupils round, and pupils reactive to light.  no conjunctival pallor, injection or icterus  Ears:  R ear normal and L ear normal.   Nose:  nares are mildly congested and boggy Mouth:  pharynx pink and moist, no erythema, and no exudates.   Neck:  supple with full rom and no masses or thyromegally, no JVD or carotid bruit  Chest Wall:  No deformities, masses, or tenderness noted. Lungs:  Normal respiratory effort, chest expands symmetrically. Lungs are clear to auscultation, no crackles or wheezes. no wheeze even on forced exp Heart:  Normal rate and regular rhythm. S1 and S2 normal without gallop, murmur, click, rub or other extra sounds. Msk:  No deformity or scoliosis noted of thoracic or lumbar spine.   Extremities:  No clubbing, cyanosis, edema, or deformity noted with normal full range of motion of all joints.   Skin:  Intact without suspicious lesions or rashes Cervical Nodes:  No lymphadenopathy noted Psych:  normal affect, talkative and pleasant - baseline MR    Impression & Recommendations:  Problem # 1:  URI (ICD-465.9) Assessment New viral with head and chest congestion - no facial pain or wheeze or fever disc cold symptoms and the way they will resolve on their  own without abx -- and that cold can last about 2 weeks rev red flags to alert me- worse prod cough/ facial pain / fever or no imp next week rec sympt care  The following medications were removed from the medication list:    Tessalon Perles 100 Mg Caps (Benzonatate) .Marland Kitchen... 1 by mouth up to three times a day as needed cough - swallow whole    Tussionex Pennkinetic Er 10-8 Mg/73ml Lqcr (Hydrocod polst-chlorphen polst) .Marland Kitchen... Take 1/2 teaspoon by mouth every 12 hours as needed  cough Her updated medication list for this problem includes:    Clarinex 5 Mg Tabs (Desloratadine) .Marland Kitchen... 1 by mouth once daily as needed allergies    Aspirin 81 Mg Tabs (Aspirin) .Marland Kitchen... 1 by mouth once daily with food  Problem # 2:  GERD (ICD-530.81) Assessment: Unchanged given samples of omeprazole since in donut hole unfortunately did not have samples of other meds ? if she would qualify for medicaid in addn to medicare?  Her updated medication list for this problem includes:    Omeprazole 20 Mg Cpdr (Omeprazole) .Marland Kitchen... 1 by mouth every am  Complete Medication List: 1)  Desonide 0.05 % Crea (Desonide) 2)  Ketoconazole 2 % Crea (Ketoconazole) .... Apply 1 a small amount to affected area twice a day as needed 3)  Cartia Xt 240 Mg Cp24 (Diltiazem hcl coated beads) .Marland Kitchen.. 1 by mouth once daily 4)  Lipitor 20 Mg Tabs (Atorvastatin calcium) .... Take one by mouth daily 5)  Omeprazole 20 Mg Cpdr (Omeprazole) .Marland Kitchen.. 1 by mouth every am 6)  Hydrochlorothiazide 12.5 Mg Tabs (Hydrochlorothiazide) .... Take 1 tablet by mouth once a day 7)  Klor-con M20 20 Meq Tbcr (Potassium chloride crys cr) .... Take 1 tablet by mouth once a day 8)  Calcium 500/d 500-200 Mg-unit Tabs (Calcium carbonate-vitamin d) .... Once daily 9)  Clarinex 5 Mg Tabs (Desloratadine) .Marland Kitchen.. 1 by mouth once daily as needed allergies 10)  Aspirin 81 Mg Tabs (Aspirin) .Marland Kitchen.. 1 by mouth once daily with food 11)  Nystatin 100000 Unit/gm Crea (Nystatin) .... Apply to rash under right arm and under breasts (red area) twice daily until better 12)  Metformin Hcl 500 Mg Tabs (Metformin hcl) .... Take one by mouth two times a day 13)  Triple Abt Oint  .... Apply twice a day to lesion until healed as needed 14)  Metamucil 30.9 % Powd (Psyllium) .... Otc as directed. 15)  Flonase 50 Mcg/act Susp (Fluticasone propionate) .Marland Kitchen.. 1 spray in each nostril once daily for 2 weeks for ear problem  stop it if you get a nosebleed 16)  Probiotic Caps (Probiotic  product) .... Take 1 capsule by mouth once a day 17)  Ventolin Hfa 108 (90 Base) Mcg/act Aers (Albuterol sulfate) .... Take 2 puffs by mouth every 4 hours as needed for shortness of breath or cough Pt given Prilosec 20mg  OTC samples lot # [id number] #50 and lot # [id number] #100 as instructed.Lewanda Rife LPN  November 01, 2009 12:02 PM  Patient Instructions: 1)  I think you have a head and chest cold which is viral  2)  antibiotics will not help this 3)  get fluids and rest  4)  I don't have samples of microzide (hydrochlorothiazide) or cardizem  5)  ask your neighbor to help you to see if you may qualify for medicaid  6)  if your symptoms worsen (fever/ cough or facial pain) leg me know   Current Allergies (reviewed today): ! AUGMENTIN *  ACE INHIBITORS GROUP IBUPROFEN (IBUPROFEN)

## 2010-02-19 NOTE — Progress Notes (Signed)
Summary: Ketoconazole  Phone Note Refill Request Message from:  Scriptline on March 21, 2009 4:26 PM  Refills Requested: Medication #1:  KETOCONAZOLE 2 % CREA Apply 1 a small amount to affected area twice a day as needed [facility]  [address] [id number]   Last Fill Date:  No date sent   Pharmacy Phone:  [phone]   Method Requested: Electronic Initial call taken by: Delilah Shan CMA Duncan Dull),  March 21, 2009 4:26 PM  Follow-up for Phone Call        I called [facility] and spoke with Joni Reining. They did not have refill Dr Dayton Martes gave on 01/22/09. That refill was given verbally.Lewanda Rife LPN  March 22, 7827 4:29 PM

## 2010-02-19 NOTE — Progress Notes (Signed)
Summary: call a nurse   Phone Note Call from Patient   Summary of Call: Triage Record Num: [id number] Operator: Migdalia Dk Patient Name: Linda Thompson Call Date & Time: 12/03/2009 7:39:25AM Patient Phone: [phone] PCP: Audrie Gallus. Tower Patient Gender: Female PCP Fax : Patient DOB: 28-Jul-1955 Practice Name: [facility] Reason for Call: Pt. states dx'd with stomach virus in office on 11/27/2009. "I have been eating apple sauce and stuff and it got better. Thursday evening I got some [employer] chicken and then on Friday my bowels was running." Seen again in office on 11/30/2009 due to diarrhea, instructed on bland diet. Pt. states ate mac n cheese, broccoli salad and brownie last p.m. "Well before I left I doo dood in my pants." States stomach still hurting this a.m., pt states stomach may be bloated or swollen. Will continue with bland diet and clear fluids. Will call office this a.m.for appt. Protocol(s) Used: Diarrhea or Other Change in Bowel Habits Recommended Outcome per Protocol: See Provider within 4 hours Reason for Outcome: Diarrhea associated with new abdominal bloating, distension or swelling Care Advice:  ~ 11/ Initial call taken by: Melody Comas,  December 04, 2009 9:01 AM

## 2010-02-19 NOTE — Assessment & Plan Note (Signed)
Summary: LEFT EAR ITCHING/DLO   Vital Signs:  Patient profile:   55 year old female Height:      62 inches Weight:      211.50 pounds BMI:     38.82 Temp:     98.4 degrees F oral Pulse rate:   68 / minute Pulse rhythm:   regular BP sitting:   130 / 80  (left arm) Cuff size:   large  Vitals Entered By: Linde Gillis CMA Duncan Dull) (August 21, 2009 11:56 AM) CC: right ear itching   History of Present Illness: 55 yo here for right ear itching.  Noticed it last night.  No ear pain, fevers, or URI symptoms. No difficulty hearing.    No rash.  No recent sick contacts.    Current Medications (verified): 1)  Desonide 0.05 % Crea (Desonide) 2)  Ketoconazole 2 % Crea (Ketoconazole) .... Apply 1 A Small Amount To Affected Area Twice A Day As Needed 3)  Cartia Xt 240 Mg  Cp24 (Diltiazem Hcl Coated Beads) .Marland Kitchen.. 1 By Mouth Once Daily 4)  Lipitor 20 Mg Tabs (Atorvastatin Calcium) .... Take One By Mouth Daily 5)  Omeprazole 20 Mg  Cpdr (Omeprazole) .Marland Kitchen.. 1 By Mouth Every Am 6)  Hydrochlorothiazide 12.5 Mg  Tabs (Hydrochlorothiazide) .... Take 1 Tablet By Mouth Once A Day 7)  Klor-Con M20 20 Meq  Tbcr (Potassium Chloride Crys Cr) .... Take 1 Tablet By Mouth Once A Day 8)  Calcium 500/d 500-200 Mg-Unit  Tabs (Calcium Carbonate-Vitamin D) .... Once Daily 9)  Clarinex 5 Mg  Tabs (Desloratadine) .Marland Kitchen.. 1 By Mouth Once Daily As Needed Allergies 10)  Aspirin 81 Mg Tabs (Aspirin) .Marland Kitchen.. 1 By Mouth Once Daily With Food 11)  Nystatin 100000 Unit/gm Crea (Nystatin) .... Apply To Rash Under Right Arm and Under Breasts (Red Area) Twice Daily Until Better 12)  Metformin Hcl 500 Mg Tabs (Metformin Hcl) .... Take One By Mouth Two Times A Day 13)  Triple Abt Oint .... Apply Twice A Day To Lesion Until Healed As Needed 14)  Tessalon Perles 100 Mg Caps (Benzonatate) .Marland Kitchen.. 1 By Mouth Up To Three Times A Day As Needed Cough - Swallow Whole 15)  Pepto-Bismol Max Strength 525 Mg/24ml Susp (Bismuth Subsalicylate) .... Otc  As Directed. 16)  Metamucil 30.9 % Powd (Psyllium) .... Otc As Directed. 17)  Flonase 50 Mcg/act Susp (Fluticasone Propionate) .Marland Kitchen.. 1 Spray in Each Nostril Once Daily For 2 Weeks For Ear Problem  Stop It If You Get A Nosebleed 18)  Nitrofurantoin Macrocrystal 100 Mg Caps (Nitrofurantoin Macrocrystal) .... Take One Capsule Twice A Day For 7 Days 19)  Probiotic  Caps (Probiotic Product) .... Take 1 Capsule By Mouth Once A Day 20)  Loperamide Hcl 2 Mg Caps (Loperamide Hcl) .... Take Two Caplets After 1st Loose Stool Then 1 Captlet After Each Subsequent Loose Stool, Not To Take More Than 4 Caplets in 24 Hr Period.  Allergies: 1)  ! Augmentin 2)  * Ace Inhibitors Group 3)  Ibuprofen (Ibuprofen)  Review of Systems      See HPI General:  Denies fever. ENT:  Denies earache.  Physical Exam  General:  overweight but generally well appearing hygiene is fair  Ears:  R ear normal and L ear normal.   mild dry skin behind right ear. Tragus non tender Mouth:  Oral mucosa and oropharynx without lesions or exudates.  Teeth in good repair. Psych:  normal affect, talkative and pleasant - baseline MR  Impression & Recommendations:  Problem # 1:  ? of UNSPECIFIED INFECTIVE OTITIS EXTERNA (ICD-380.10) Assessment New Reassurance provided.  LIkely due to dry skin, advised moisturizing after bathing.  Complete Medication List: 1)  Desonide 0.05 % Crea (Desonide) 2)  Ketoconazole 2 % Crea (Ketoconazole) .... Apply 1 a small amount to affected area twice a day as needed 3)  Cartia Xt 240 Mg Cp24 (Diltiazem hcl coated beads) .Marland Kitchen.. 1 by mouth once daily 4)  Lipitor 20 Mg Tabs (Atorvastatin calcium) .... Take one by mouth daily 5)  Omeprazole 20 Mg Cpdr (Omeprazole) .Marland Kitchen.. 1 by mouth every am 6)  Hydrochlorothiazide 12.5 Mg Tabs (Hydrochlorothiazide) .... Take 1 tablet by mouth once a day 7)  Klor-con M20 20 Meq Tbcr (Potassium chloride crys cr) .... Take 1 tablet by mouth once a day 8)  Calcium 500/d  500-200 Mg-unit Tabs (Calcium carbonate-vitamin d) .... Once daily 9)  Clarinex 5 Mg Tabs (Desloratadine) .Marland Kitchen.. 1 by mouth once daily as needed allergies 10)  Aspirin 81 Mg Tabs (Aspirin) .Marland Kitchen.. 1 by mouth once daily with food 11)  Nystatin 100000 Unit/gm Crea (Nystatin) .... Apply to rash under right arm and under breasts (red area) twice daily until better 12)  Metformin Hcl 500 Mg Tabs (Metformin hcl) .... Take one by mouth two times a day 13)  Triple Abt Oint  .... Apply twice a day to lesion until healed as needed 14)  Tessalon Perles 100 Mg Caps (Benzonatate) .Marland Kitchen.. 1 by mouth up to three times a day as needed cough - swallow whole 15)  Pepto-bismol Max Strength 525 Mg/55ml Susp (Bismuth subsalicylate) .... Otc as directed. 16)  Metamucil 30.9 % Powd (Psyllium) .... Otc as directed. 17)  Flonase 50 Mcg/act Susp (Fluticasone propionate) .Marland Kitchen.. 1 spray in each nostril once daily for 2 weeks for ear problem  stop it if you get a nosebleed 18)  Nitrofurantoin Macrocrystal 100 Mg Caps (Nitrofurantoin macrocrystal) .... Take one capsule twice a day for 7 days 19)  Probiotic Caps (Probiotic product) .... Take 1 capsule by mouth once a day 20)  Loperamide Hcl 2 Mg Caps (Loperamide hcl) .... Take two caplets after 1st loose stool then 1 captlet after each subsequent loose stool, not to take more than 4 caplets in 24 hr period.  Current Allergies (reviewed today): ! AUGMENTIN * ACE INHIBITORS GROUP IBUPROFEN (IBUPROFEN)

## 2010-02-19 NOTE — Assessment & Plan Note (Signed)
Summary: EAR/CLE   Vital Signs:  Patient profile:   55 year old female Height:      62 inches Weight:      221.25 pounds BMI:     40.61 Temp:     97.8 degrees F oral Pulse rate:   88 / minute Pulse rhythm:   regular BP sitting:   130 / 86  (left arm) Cuff size:   regular  Vitals Entered By: Lewanda Rife LPN (Jun 01, 2009 11:19 AM) CC: left earache   History of Present Illness: L ear is bothering her  feels like pain and pressure- esp behind it  no hearing probs ? wax   no runny nose or fever  she does have some allergies this tme of year  diarrhea is generally getting better   Allergies: 1)  ! Augmentin 2)  * Ace Inhibitors Group 3)  Ibuprofen (Ibuprofen)  Past History:  Past Medical History: Last updated: 03/23/2008 GERD Hyperlipidemia Hypertension obesity MR hyperglycemia all rhinitis OA in R foot glaucoma   podiatry Dr Ralene Cork dentist Dr Ulysees Barns Dr Dione Booze derm-- Dr Terri Piedra  Past Surgical History: Last updated: 03/23/2008 Hysterectomy arm fx- past cataract surgery   Family History: Last updated: Feb 19, 2009 mother DM- deceased father - deceased  aunt DM- deceased , CVA, pacemaker   Social History: Last updated: 08/13/2007 lives alone- with help from neighbors currently- disability for arm fx non smoker no alcohol   Review of Systems General:  Denies fatigue, fever, loss of appetite, and malaise. Eyes:  Denies blurring, discharge, and eye irritation. ENT:  Complains of postnasal drainage; denies ear discharge, earache, nasal congestion, sinus pressure, and sore throat. CV:  Denies chest pain or discomfort, palpitations, and swelling of feet. Resp:  Denies cough, shortness of breath, and wheezing. GI:  Denies abdominal pain, dark tarry stools, and indigestion; diarrhea is improved . MS:  Denies joint pain. Derm:  Denies itching and rash. Neuro:  Denies headaches. Endo:  Denies heat intolerance. Heme:  Denies abnormal bruising and  bleeding.  Physical Exam  General:  overweight but generally well appearing hygiene is fair  Head:  normocephalic, atraumatic, and no abnormalities observed.  no sinus or temporal tenderness Eyes:  vision grossly intact, pupils equal, pupils round, pupils reactive to light, and no injection.   Ears:  TMs are dull - with small eff bilat L one is retracted no erythema or bulging no cerumen Nose:  nares are boggy and partially congested  Mouth:  pharynx pink and moist, no erythema, and no exudates.   Neck:  supple with full rom and no masses or thyromegally, no JVD or carotid bruit  Lungs:  Normal respiratory effort, chest expands symmetrically. Lungs are clear to auscultation, no crackles or wheezes. Abdomen:  soft and non-tender.   Skin:  Intact without suspicious lesions or rashes Cervical Nodes:  No lymphadenopathy noted Psych:  normal affect, talkative and pleasant    Impression & Recommendations:  Problem # 1:  EUSTACHIAN TUBE DYSFUNCTION (ICD-381.81) Assessment New suspect due to all rhinitis with some retraction of L TM  will treat short term with flonase -- just 2 weeks (nosebleed  in past) update if worse or not imp   Problem # 2:  DIARRHEA (ICD-787.91) Assessment: Improved clinically improved with fiber pending stool cx to make plan -- final report not back yet rev other labs with pt Her updated medication list for this problem includes:    Pepto-bismol Max Strength 525 Mg/79ml Susp (Bismuth subsalicylate) .Marland KitchenMarland KitchenMarland KitchenMarland Kitchen  Otc as directed.  Complete Medication List: 1)  Desonide 0.05 % Crea (Desonide) 2)  Ketoconazole 2 % Crea (Ketoconazole) .... Apply 1 a small amount to affected area twice a day as needed 3)  Cartia Xt 240 Mg Cp24 (Diltiazem hcl coated beads) .Marland Kitchen.. 1 by mouth once daily 4)  Lipitor 20 Mg Tabs (Atorvastatin calcium) .... Take one by mouth daily 5)  Omeprazole 20 Mg Cpdr (Omeprazole) .Marland Kitchen.. 1 by mouth every am 6)  Hydrochlorothiazide 12.5 Mg Tabs  (Hydrochlorothiazide) .... Take 1 tablet by mouth once a day 7)  Klor-con M20 20 Meq Tbcr (Potassium chloride crys cr) .... Take 1 tablet by mouth once a day 8)  Calcium 500/d 500-200 Mg-unit Tabs (Calcium carbonate-vitamin d) .... Once daily 9)  Clarinex 5 Mg Tabs (Desloratadine) .Marland Kitchen.. 1 by mouth once daily as needed allergies 10)  Aspirin 81 Mg Tabs (Aspirin) .Marland Kitchen.. 1 by mouth once daily with food 11)  Nystatin 100000 Unit/gm Crea (Nystatin) .... Apply to rash under right arm and under breasts (red area) twice daily until better 12)  Metformin Hcl 500 Mg Tabs (Metformin hcl) .... Take one by mouth two times a day 13)  Triple Abt Oint  .... Apply twice a day to lesion until healed as needed 14)  Tessalon Perles 100 Mg Caps (Benzonatate) .Marland Kitchen.. 1 by mouth up to three times a day as needed cough - swallow whole 15)  Pepto-bismol Max Strength 525 Mg/41ml Susp (Bismuth subsalicylate) .... Otc as directed. 16)  Metamucil 30.9 % Powd (Psyllium) .... Otc as directed. 17)  Flonase 50 Mcg/act Susp (Fluticasone propionate) .Marland Kitchen.. 1 spray in each nostril once daily for 2 weeks for ear problem  stop it if you get a nosebleed  Patient Instructions: 1)  I think your ear discomfort is from allergies - causing a backup of fluid behind your ear drum 2)  the flonase nasal spray should help- but I only want you using it for 2 weeks- because it can cause nosebleeds 3)  I sent this to your drugstore  4)  update me in 2 weeks if ear discomfort is not better 5)  eat healthy low fat diet  6)  update me if diarrhea worsens  7)  I will call when last stool tests are back Prescriptions: FLONASE 50 MCG/ACT SUSP (FLUTICASONE PROPIONATE) 1 spray in each nostril once daily for 2 weeks for ear problem  stop it if you get a nosebleed  #1 mdi x 0   Entered and Authorized by:   Judith Part MD   Signed by:   Judith Part MD on 06/01/2009   Method used:   Electronically to        [facility]  [place] (retail)        [address]       [place], [place]  [address]       Ph: [phone] or [phone]       Fax: [phone]   RxID:   [id number]   Current Allergies (reviewed today): ! AUGMENTIN * ACE INHIBITORS GROUP IBUPROFEN (IBUPROFEN)

## 2010-02-19 NOTE — Assessment & Plan Note (Signed)
Summary: COUGHING, VOMITTING.. CYD   Vital Signs:  Patient profile:   55 year old female Weight:      224 pounds Temp:     97.7 degrees F oral Pulse rate:   88 / minute Pulse rhythm:   regular BP sitting:   124 / 76  (left arm) Cuff size:   large  Vitals Entered By: Sydell Axon LPN (January 24, 2009 11:20 AM) CC: Coughing and vomitting   History of Present Illness: Linda Thompson is a 55 y/o female who presents today due to a cough with sputum that started last night.  This morning during one bad coughing spell she "threw up." There are no complaints of fever, headache, sinus tenderness, or sore throat and the cough has not recurred since this morning.  She has taken some OTC cough medication but could not remember which one. She reports her diabetes has been ok, no elevations of which she is aware.  Problems Prior to Update: 1)  Folliculitis  (ICD-704.8) 2)  Skin Tag  (ICD-701.9) 3)  Dermatitis, Hands  (ICD-692.9) 4)  Leg Pain, Left  (ICD-729.5) 5)  Dry Skin  (ICD-701.1) 6)  Transaminases, Serum, Elevated  (ICD-790.4) 7)  Contact Dermatitis&other Eczema Due Unspec Cause  (ICD-692.9) 8)  Diabetes Mellitus, Type II  (ICD-250.00) 9)  Allergic Rhinitis  (ICD-477.9) 10)  Fungal Dermatitis  (ICD-111.9) 11)  Dermatitis, Seborrheic Nos  (ICD-690.10) 12)  Aftercare, Long-term Use, Medications Nec  (ICD-V58.69) 13)  Hiatal Hernia  (ICD-553.3) 14)  S/P Post-cataract Laser Surgery  ([id number]) 15)  Hx of Fracture, Foot, Hx of  (ICD-V13.5) 16)  Fracture, Arm, Right, Hx of  (ICD-V13.5) 17)  Meningioma  (ICD-225.2) 18)  Osteoarthritis, Foot  (ICD-715.97) 19)  Pruritus Ani  (ICD-698.0) 20)  Hypertension  (ICD-401.9) 21)  Hyperlipidemia  (ICD-272.4) 22)  Gerd  (ICD-530.81) 23)  Mental Retardation, Moderate  (ICD-318.0) 24)  Glaucoma Nos  (ICD-365.9)  Medications Prior to Update: 1)  Desonide 0.05 % Crea (Desonide) 2)  Ketoconazole 2 % Crea (Ketoconazole) .... Apply 1 A Small Amount To  Affected Area Twice A Day As Needed 3)  Cartia Xt 240 Mg  Cp24 (Diltiazem Hcl Coated Beads) .Marland Kitchen.. 1 By Mouth Once Daily 4)  Lipitor 10 Mg Tabs (Atorvastatin Calcium) .Marland Kitchen.. 1 By Mouth Once Daily 5)  Omeprazole 20 Mg  Cpdr (Omeprazole) .Marland Kitchen.. 1 By Mouth Every Am 6)  Hydrochlorothiazide 12.5 Mg  Tabs (Hydrochlorothiazide) .... Take 1 Tablet By Mouth Once A Day 7)  Klor-Con M20 20 Meq  Tbcr (Potassium Chloride Crys Cr) .... Take 1 Tablet By Mouth Once A Day 8)  Calcium 500/d 500-200 Mg-Unit  Tabs (Calcium Carbonate-Vitamin D) .... Once Daily 9)  Ketoconazole 2 %  Sham (Ketoconazole) .... Use As Directed Twice Weekly Prn 10)  Clarinex 5 Mg  Tabs (Desloratadine) .Marland Kitchen.. 1 By Mouth Once Daily As Needed Allergies 11)  Aspirin 81 Mg Tabs (Aspirin) .Marland Kitchen.. 1 By Mouth Once Daily With Food 12)  Nystatin 100000 Unit/gm Crea (Nystatin) .... Apply To Rash Under Right Arm and Under Breasts (Red Area) Twice Daily Until Better 13)  Metformin Hcl 500 Mg Tabs (Metformin Hcl) .... 1/2 By Mouth Two Times A Day - With Breakfast and Supper 14)  Triple Abt Oint .... Apply Twice A Day To Lesion Until Healed  Allergies: 1)  ! Augmentin 2)  * Ace Inhibitors Group 3)  Ibuprofen (Ibuprofen)  Physical Exam  General:  Well-developed,well-nourished,in no acute distress; alert,appropriate and cooperative throughout examination Head:  Normocephalic and atraumatic without obvious abnormalities. No apparent alopecia or balding. Sinuses NT. Eyes:  Conjunctiva clear bilat. Ears:  External ear exam shows no significant lesions or deformities.  Otoscopic examination reveals clear canals, tympanic membranes are intact bilaterally without bulging, retraction, inflammation or discharge. Hearing is grossly normal bilaterally. Nose:  External nasal examination shows no deformity or inflammation. Nasal mucosa are pink and moist without lesions or exudates. Mouth:  Oral mucosa and oropharynx without lesions or exudates.  Teeth in good  repair. Neck:  No deformities, masses, or tenderness noted. Lungs:  Normal respiratory effort, chest expands symmetrically. Lungs are clear to auscultation, no crackles or wheezes. Heart:  Normal rate and regular rhythm. S1 and S2 normal without gallop, murmur, click, rub or other extra sounds.   Impression & Recommendations:  Problem # 1:  COUGH (ICD-786.2) Assessment New Prersumed viral. With cough for less than 24 hrs, diff to determine. See instructions.  Problem # 2:  DIABETES MELLITUS, TYPE II (ICD-250.00) Assessment: Unchanged  Pt reports has "Been OK." Her updated medication list for this problem includes:    Aspirin 81 Mg Tabs (Aspirin) .Marland Kitchen... 1 by mouth once daily with food    Metformin Hcl 500 Mg Tabs (Metformin hcl) .Marland Kitchen... 1/2 by mouth two times a day - with breakfast and supper  Her updated medication list for this problem includes:    Aspirin 81 Mg Tabs (Aspirin) .Marland Kitchen... 1 by mouth once daily with food    Metformin Hcl 500 Mg Tabs (Metformin hcl) .Marland Kitchen... 1/2 by mouth two times a day - with breakfast and supper  Complete Medication List: 1)  Desonide 0.05 % Crea (Desonide) 2)  Ketoconazole 2 % Crea (Ketoconazole) .... Apply 1 a small amount to affected area twice a day as needed 3)  Cartia Xt 240 Mg Cp24 (Diltiazem hcl coated beads) .Marland Kitchen.. 1 by mouth once daily 4)  Lipitor 10 Mg Tabs (Atorvastatin calcium) .Marland Kitchen.. 1 by mouth once daily 5)  Omeprazole 20 Mg Cpdr (Omeprazole) .Marland Kitchen.. 1 by mouth every am 6)  Hydrochlorothiazide 12.5 Mg Tabs (Hydrochlorothiazide) .... Take 1 tablet by mouth once a day 7)  Klor-con M20 20 Meq Tbcr (Potassium chloride crys cr) .... Take 1 tablet by mouth once a day 8)  Calcium 500/d 500-200 Mg-unit Tabs (Calcium carbonate-vitamin d) .... Once daily 9)  Ketoconazole 2 % Sham (Ketoconazole) .... Use as directed twice weekly prn 10)  Clarinex 5 Mg Tabs (Desloratadine) .Marland Kitchen.. 1 by mouth once daily as needed allergies 11)  Aspirin 81 Mg Tabs (Aspirin) .Marland Kitchen.. 1 by  mouth once daily with food 12)  Nystatin 100000 Unit/gm Crea (Nystatin) .... Apply to rash under right arm and under breasts (red area) twice daily until better 13)  Metformin Hcl 500 Mg Tabs (Metformin hcl) .... 1/2 by mouth two times a day - with breakfast and supper 14)  Triple Abt Oint  .... Apply twice a day to lesion until healed  Patient Instructions: 1)  Cont Clarinex. 2)  Take Guaifenesin by [facility] or [facility] and getting MUCOUS RELIEF EXPECTORANT (400mg ), take 11/2 tabs by mouth AM and NOON. 3)  Drink lots of fluids anytime taking Guaifenesin.  4)  Keep cough drop in your mouth all day for about a week. 5)  worsening.  Current Allergies (reviewed today): ! AUGMENTIN * ACE INHIBITORS GROUP IBUPROFEN (IBUPROFEN)

## 2010-02-19 NOTE — Assessment & Plan Note (Signed)
Summary: STOMACH/CLE   Vital Signs:  Patient profile:   55 year old female Height:      62 inches Weight:      198.50 pounds BMI:     36.44 Temp:     97.5 degrees F oral Pulse rate:   84 / minute Pulse rhythm:   regular BP sitting:   118 / 80  (left arm) Cuff size:   large  Vitals Entered By: Lewanda Rife LPN (November 27, 2009 11:48 AM) CC: Had loose BM last night and nauseated. Non productive cough also.   History of Present Illness: last night had a loose bm and got nauseated  some discomfort in lower abd  had BM and it was loose -- in the middle of night  took some pepto and ate some apple sauce  dry heaved once - but did not actually vomit   a little dry cough and some sneezing   no fever   now nausea is improved  was able to eat some grits for breakfast  no more diarrhea since last night  is drinking fluids   Allergies: 1)  ! Augmentin 2)  * Ace Inhibitors Group 3)  Ibuprofen (Ibuprofen)  Past History:  Past Medical History: Last updated: 10/02/2009 GERD Hyperlipidemia Hypertension obesity MR hyperglycemia all rhinitis OA in R foot glaucoma chronic low back pain with radiculopathy (deg disk)  podiatry Dr Ralene Cork dentist Dr Ulysees Barns Dr Dione Booze derm-- Dr Terri Piedra ortho [facility]  Past Surgical History: Last updated: 03/23/2008 Hysterectomy arm fx- past cataract surgery   Family History: Last updated: Feb 16, 2009 mother DM- deceased father - deceased  aunt DM- deceased , CVA, pacemaker   Social History: Last updated: 08/13/2007 lives alone- with help from neighbors currently- disability for arm fx non smoker no alcohol   Review of Systems General:  Denies chills, fatigue, fever, and loss of appetite. Eyes:  Denies blurring, discharge, and eye pain. ENT:  Complains of postnasal drainage; denies sinus pressure and sore throat. CV:  Denies chest pain or discomfort and palpitations. Resp:  Complains of cough; denies pleuritic, shortness  of breath, sputum productive, and wheezing. GI:  Complains of change in bowel habits, diarrhea, loss of appetite, and nausea; denies abdominal pain, bloody stools, and vomiting. Derm:  Denies itching, lesion(s), poor wound healing, and rash. Neuro:  Denies numbness and tingling. Heme:  Denies abnormal bruising and bleeding.  Physical Exam  General:  overweight but generally well appearing  Head:  normocephalic, atraumatic, and no abnormalities observed.   Eyes:  vision grossly intact, pupils equal, pupils round, and pupils reactive to light.  no conjunctival pallor, injection or icterus  Mouth:  pharynx pink and moist.   Neck:  supple with full rom and no masses or thyromegally, no JVD or carotid bruit  Lungs:  Normal respiratory effort, chest expands symmetrically. Lungs are clear to auscultation, no crackles or wheezes. Heart:  Normal rate and regular rhythm. S1 and S2 normal without gallop, murmur, click, rub or other extra sounds. Abdomen:  Bowel sounds positive,abdomen soft and non-tender without masses, organomegaly or hernias noted. Extremities:  No clubbing, cyanosis, edema, or deformity noted with normal full range of motion of all joints.   Skin:  Intact without suspicious lesions or rashes Cervical Nodes:  No lymphadenopathy noted Inguinal Nodes:  No significant adenopathy Psych:  baseline MR repeats herself a lot  pleasant    Impression & Recommendations:  Problem # 1:  DIARRHEA (ICD-787.91) Assessment New one episode -mild  with nausea- now resolved as disc at last visit - may be fighting a virus and need to adv diet more slowly disc BRAt diet with clear fluids in detail  update if worse or fever or abd pain  pepto is ok once daily only if needed Her updated medication list for this problem includes:    Probiotic Caps (Probiotic product) .Marland Kitchen... Take 1 capsule by mouth once a day    Pepto-bismol Max Strength 525 Mg/3ml Susp (Bismuth subsalicylate) ..... Otc as  directed.  Complete Medication List: 1)  Desonide 0.05 % Crea (Desonide) 2)  Ketoconazole 2 % Crea (Ketoconazole) .... Apply 1 a small amount to affected area twice a day as needed 3)  Cartia Xt 240 Mg Cp24 (Diltiazem hcl coated beads) .Marland Kitchen.. 1 by mouth once daily 4)  Lipitor 20 Mg Tabs (Atorvastatin calcium) .... Take one by mouth daily 5)  Omeprazole 20 Mg Cpdr (Omeprazole) .Marland Kitchen.. 1 by mouth every am 6)  Hydrochlorothiazide 12.5 Mg Tabs (Hydrochlorothiazide) .... Take 1 tablet by mouth once a day 7)  Klor-con M20 20 Meq Tbcr (Potassium chloride crys cr) .... Take 1 tablet by mouth once a day 8)  Calcium 500/d 500-200 Mg-unit Tabs (Calcium carbonate-vitamin d) .... Once daily 9)  Clarinex 5 Mg Tabs (Desloratadine) .Marland Kitchen.. 1 by mouth once daily as needed allergies 10)  Aspirin 81 Mg Tabs (Aspirin) .Marland Kitchen.. 1 by mouth once daily with food 11)  Nystatin 100000 Unit/gm Crea (Nystatin) .... Apply to rash under right arm and under breasts (red area) twice daily until better as needed 12)  Metformin Hcl 500 Mg Tabs (Metformin hcl) .... Take one by mouth two times a day 13)  Triple Abt Oint  .... Apply twice a day to lesion until healed as needed 14)  Metamucil 30.9 % Powd (Psyllium) .... Otc as directed. 15)  Flonase 50 Mcg/act Susp (Fluticasone propionate) .Marland Kitchen.. 1 spray in each nostril once daily for 2 weeks for ear problem  stop it if you get a nosebleed as needed 16)  Probiotic Caps (Probiotic product) .... Take 1 capsule by mouth once a day 17)  Ventolin Hfa 108 (90 Base) Mcg/act Aers (Albuterol sulfate) .... Take 2 puffs by mouth every 4 hours as needed for shortness of breath or cough 18)  Pepto-bismol Max Strength 525 Mg/2ml Susp (Bismuth subsalicylate) .... Otc as directed.  Patient Instructions: 1)  drink fluids and stick with the bland BRAT diet -- rice/ bananas/ applesauce and toast  2)  pepto bismol is ok once daily as needed for nausea or diarrhea 3)  if increased abdominal pain or fever or  vomiting - call and let me know  4)  let me know if not feeling better by the end of the week - I think you are fighting a virus    Orders Added: 1)  Est. Patient Level III [[address]]    Current Allergies (reviewed today): ! AUGMENTIN * ACE INHIBITORS GROUP IBUPROFEN (IBUPROFEN)

## 2010-02-19 NOTE — Assessment & Plan Note (Signed)
Summary: ? VAGINAL ITCHING   Vital Signs:  Patient profile:   55 year old female Height:      62 inches Weight:      227 pounds BMI:     41.67 Temp:     97.6 degrees F oral Pulse rate:   80 / minute Pulse rhythm:   regular BP sitting:   126 / 88  (left arm) Cuff size:   large  Vitals Entered By: Lewanda Rife LPN (March 23, 1608 10:56 AM)  History of Present Illness: is having some vaginal irritation esp when she urinates no pain no pelvic pain  no fever  no frequent urination  was on abx recently -- so poss yeast infx  no discharge is not (or ever) sexualy active  Allergies: 1)  ! Augmentin 2)  * Ace Inhibitors Group 3)  Ibuprofen (Ibuprofen)  Past History:  Past Medical History: Last updated: 03/23/2008 GERD Hyperlipidemia Hypertension obesity MR hyperglycemia all rhinitis OA in R foot glaucoma   podiatry Dr Ralene Cork dentist Dr Ulysees Barns Dr Dione Booze derm-- Dr Terri Piedra  Past Surgical History: Last updated: 03/23/2008 Hysterectomy arm fx- past cataract surgery   Family History: Last updated: 03/07/09 mother DM- deceased father - deceased  aunt DM- deceased , CVA, pacemaker   Social History: Last updated: 08/13/2007 lives alone- with help from neighbors currently- disability for arm fx non smoker no alcohol   Review of Systems General:  Denies fatigue, fever, and loss of appetite. Eyes:  Denies blurring, discharge, and eye irritation. Resp:  Denies cough. GU:  Denies abnormal vaginal bleeding, discharge, dysuria, hematuria, incontinence, and urinary frequency. Derm:  Denies itching, lesion(s), poor wound healing, and rash. Neuro:  Denies numbness and tingling.  Physical Exam  General:  overweight but generally well appearing hygiene is fair  Head:  normocephalic, atraumatic, and no abnormalities observed.   Neck:  No deformities, masses, or tenderness noted. Lungs:  Normal respiratory effort, chest expands symmetrically. Lungs are clear  to auscultation, no crackles or wheezes. Heart:  Normal rate and regular rhythm. S1 and S2 normal without gallop, murmur, click, rub or other extra sounds. Abdomen:  no suprapubic tenderness or fullness felt  Genitalia:  mild hyperemia of labia minora bilat without lesions no discharge  swab obt for wet prep without discomfort Msk:  no CVA tenderness  Skin:  Intact without suspicious lesions or rashes Cervical Nodes:  No lymphadenopathy noted Inguinal Nodes:  No significant adenopathy Psych:  normal affect, talkative and pleasant    Impression & Recommendations:  Problem # 1:  CANDIDIASIS, VAGINAL (ICD-112.1) Assessment New  after an antibiotic - mild with itching but no discharge  urine is not clean catch -- poor sample - so did adv to update me if any urinary symptoms develop  some hyphae  on her wet prep  will tx with one time oral dose of diflucan and update   Her updated medication list for this problem includes:    Ketoconazole 2 % Crea (Ketoconazole) .Marland Kitchen... Apply 1 a small amount to affected area twice a day as needed    Ketoconazole 2 % Sham (Ketoconazole) ..... Use as directed twice weekly prn    Nystatin 100000 Unit/gm Crea (Nystatin) .Marland Kitchen... Apply to rash under right arm and under breasts (red area) twice daily until better    Diflucan 150 Mg Tabs (Fluconazole) .Marland Kitchen... 1 by mouth times one for yeast infection  Orders: UA Dipstick W/ Micro (manual) ([address]) Wet Prep (40981XB)  Complete Medication List:  1)  Desonide 0.05 % Crea (Desonide) 2)  Ketoconazole 2 % Crea (Ketoconazole) .... Apply 1 a small amount to affected area twice a day as needed 3)  Cartia Xt 240 Mg Cp24 (Diltiazem hcl coated beads) .Marland Kitchen.. 1 by mouth once daily 4)  Lipitor 20 Mg Tabs (Atorvastatin calcium) .... Take one by mouth daily 5)  Omeprazole 20 Mg Cpdr (Omeprazole) .Marland Kitchen.. 1 by mouth every am 6)  Hydrochlorothiazide 12.5 Mg Tabs (Hydrochlorothiazide) .... Take 1 tablet by mouth once a day 7)  Klor-con M20  20 Meq Tbcr (Potassium chloride crys cr) .... Take 1 tablet by mouth once a day 8)  Calcium 500/d 500-200 Mg-unit Tabs (Calcium carbonate-vitamin d) .... Once daily 9)  Ketoconazole 2 % Sham (Ketoconazole) .... Use as directed twice weekly prn 10)  Clarinex 5 Mg Tabs (Desloratadine) .Marland Kitchen.. 1 by mouth once daily as needed allergies 11)  Aspirin 81 Mg Tabs (Aspirin) .Marland Kitchen.. 1 by mouth once daily with food 12)  Nystatin 100000 Unit/gm Crea (Nystatin) .... Apply to rash under right arm and under breasts (red area) twice daily until better 13)  Metformin Hcl 500 Mg Tabs (Metformin hcl) .... Take one by mouth two times a day 14)  Triple Abt Oint  .... Apply twice a day to lesion until healed as needed 15)  Diflucan 150 Mg Tabs (Fluconazole) .Marland Kitchen.. 1 by mouth times one for yeast infection  Patient Instructions: 1)  I think you have a vaginal yeast infection  2)  please take the diflucan pill one time orally with a glass of water  3)  please update me in 1 week if not improved or if any urinary symptoms  Prescriptions: DIFLUCAN 150 MG TABS (FLUCONAZOLE) 1 by mouth times one for yeast infection  #1 x 0   Entered and Authorized by:   Judith Part MD   Signed by:   Judith Part MD on 03/23/2009   Method used:   Print then Give to Patient   RxID:   [id number]   Current Allergies (reviewed today): ! AUGMENTIN * ACE INHIBITORS GROUP IBUPROFEN (IBUPROFEN)  Laboratory Results   Urine Tests  Date/Time Received: March 23, 2009 10:58 AM  Date/Time Reported: March 23, 2009 10:58 AM   Routine Urinalysis   Color: yellow Appearance: Cloudy Glucose: negative   (Normal Range: Negative) Bilirubin: negative   (Normal Range: Negative) Ketone: negative   (Normal Range: Negative) Spec. Gravity: 1.015   (Normal Range: 1.003-1.035) Blood: trace-intact   (Normal Range: Negative) pH: 6.0   (Normal Range: 5.0-8.0) Protein: trace   (Normal Range: Negative) Urobilinogen: 0.2   (Normal Range:  0-1) Nitrite: negative   (Normal Range: Negative) Leukocyte Esterace: trace   (Normal Range: Negative)  Urine Microscopic WBC/HPF: 2-3 RBC/HPF: 0 Bacteria/HPF: mild Mucous/HPF: many Epithelial/HPF: many Crystals/HPF: few Casts/LPF: 0 Yeast/HPF: 0 Other: 0      Wet Mount Source: vaginal  WBC/hpf: 1-5 Bacteria/hpf: rare  Rods Clue cells/hpf: none  Negative whiff Yeast/hpf: few Wet Mount KOH: 1+ Trichomonas/hpf: none    Laboratory Results   Urine Tests    Routine Urinalysis   Color: yellow Appearance: Cloudy Glucose: negative   (Normal Range: Negative) Bilirubin: negative   (Normal Range: Negative) Ketone: negative   (Normal Range: Negative) Spec. Gravity: 1.015   (Normal Range: 1.003-1.035) Blood: trace-intact   (Normal Range: Negative) pH: 6.0   (Normal Range: 5.0-8.0) Protein: trace   (Normal Range: Negative) Urobilinogen: 0.2   (Normal Range: 0-1) Nitrite: negative   (  Normal Range: Negative) Leukocyte Esterace: trace   (Normal Range: Negative)  Urine Microscopic WBC/hpf: 2-3 RBC/hpf: 0 Bacteria: mild Mucous: many Epithelial: many Crystals/LPF: few Casts/LPF: 0 Yeast/HPF: 0 Other: 0      Wet Mount/KOH Source: vaginal  WBC/hpf 5-10 Bacteria/hpf 1+  Rods Clue cells/hpf none  Negative whiff Yeast/hpf few KOH 1+ Trichomonas/hpf none

## 2010-02-19 NOTE — Letter (Signed)
Summary: [facility]   Imported By: Sherian Rein 10/03/2009 09:49:33  _____________________________________________________________________  External Attachment:    Type:   Image     Comment:   External Document

## 2010-02-19 NOTE — Miscellaneous (Signed)
Summary: med list update/ refills  Clinical Lists Changes  Medications: Changed medication from LIPITOR 10 MG TABS (ATORVASTATIN CALCIUM) 1 by mouth once daily to LIPITOR 20 MG TABS (ATORVASTATIN CALCIUM) take one by mouth daily - Signed Changed medication from METFORMIN HCL 500 MG TABS (METFORMIN HCL) 1/2 by mouth two times a day - with breakfast and supper to METFORMIN HCL 500 MG TABS (METFORMIN HCL) take one by mouth two times a day - Signed Rx of LIPITOR 20 MG TABS (ATORVASTATIN CALCIUM) take one by mouth daily;  #30 x 5;  Signed;  Entered by: Lowella Petties CMA;  Authorized by: Colon Flattery Tower MD;  Method used: [facility]oned to [facility]  [id number], [place], [place]  [address], Ph: [phone] or [phone], Fax: [phone] Rx of METFORMIN HCL 500 MG TABS (METFORMIN HCL) take one by mouth two times a day;  #60 x 5;  Signed;  Entered by: Lowella Petties CMA;  Authorized by: Colon Flattery Tower MD;  Method used: Telephoned to [facility]  [place] [place], [place]  [address], Ph: [phone] or [phone], Fax: [phone]    Prescriptions: METFORMIN HCL 500 MG TABS (METFORMIN HCL) take one by mouth two times a day  #60 x 5   Entered by:   Lowella Petties CMA   Authorized by:   Judith Part MD   Signed by:   Lowella Petties CMA on 02/13/2009   Method used:   Telephoned to ...       [facility]  [place] (retail)       [address]       [place], [place]  [address]       Ph: [phone] or [phone]       Fax: [phone]   RxID:   [id number] LIPITOR 20 MG TABS (ATORVASTATIN CALCIUM) take one by mouth daily  #30 x 5   Entered by:   Lowella Petties CMA   Authorized by:   Judith Part MD   Signed by:   Lowella Petties CMA on 02/13/2009   Method used:   Telephoned to ...       [facility]  [place] (retail)       [address]       [place], [place]  [address]       Ph: [phone] or [phone]       Fax:  [phone]   RxID:   [id number]   Prior Medications: DESONIDE 0.05 % CREA (DESONIDE)  KETOCONAZOLE 2 % CREA (KETOCONAZOLE) Apply 1 a small amount to affected area twice a day as needed CARTIA XT 240 MG  CP24 (DILTIAZEM HCL COATED BEADS) 1 by mouth once daily OMEPRAZOLE 20 MG  CPDR (OMEPRAZOLE) 1 by mouth every am HYDROCHLOROTHIAZIDE 12.5 MG  TABS (HYDROCHLOROTHIAZIDE) Take 1 tablet by mouth once a day KLOR-CON M20 20 MEQ  TBCR (POTASSIUM CHLORIDE CRYS CR) Take 1 tablet by mouth once a day CALCIUM 500/D 500-200 MG-UNIT  TABS (CALCIUM CARBONATE-VITAMIN D) once daily KETOCONAZOLE 2 %  SHAM (KETOCONAZOLE) use as directed twice weekly prn CLARINEX 5 MG  TABS (DESLORATADINE) 1 by mouth once daily as needed allergies ASPIRIN 81 MG TABS (ASPIRIN) 1 by mouth once daily with food NYSTATIN 100000 UNIT/GM CREA (NYSTATIN) apply to rash under right arm and under breasts (red area) twice daily until better TRIPLE ABT OINT () apply twice a day to lesion until healed Current Allergies: ! AUGMENTIN * ACE INHIBITORS GROUP IBUPROFEN (IBUPROFEN)

## 2010-02-19 NOTE — Assessment & Plan Note (Signed)
Summary: DIARRHEA,LEFT LEG PAIN/CLE   Vital Signs:  Patient profile:   55 year old female Weight:      198.75 pounds Temp:     97.5 degrees F oral Pulse rate:   76 / minute Pulse rhythm:   regular BP sitting:   122 / 80  (left arm) Cuff size:   large  Vitals Entered By: Selena Batten Dance CMA (AAMA) (November 30, 2009 9:08 AM) CC: Diarrhea/Left leg pain   History of Present Illness: CC: diarrhea  4d ago had lower abd pain, diarrhea.  seen PCP, may be stomach flu.  Has been doing [employer]  Feeling better.  Last night went to [employer] and had chicken/tea.  This am both #1 and #2 at same time x2.  Watery diarrhea.  No abd pain today, f/c, no blood in stool.  no change in meds recently.  taking metamucil, metformin, doesn't think taking probiotic.  + L leg pain (chronic).  Wanted to get checked out.  Didn't go to ER but came here.  + treated with diflucan, zpack and cipro 250mg  in last month.  Current Medications (verified): 1)  Desonide 0.05 % Crea (Desonide) 2)  Ketoconazole 2 % Crea (Ketoconazole) .... Apply 1 A Small Amount To Affected Area Twice A Day As Needed 3)  Cartia Xt 240 Mg  Cp24 (Diltiazem Hcl Coated Beads) .Marland Kitchen.. 1 By Mouth Once Daily 4)  Lipitor 20 Mg Tabs (Atorvastatin Calcium) .... Take One By Mouth Daily 5)  Omeprazole 20 Mg  Cpdr (Omeprazole) .Marland Kitchen.. 1 By Mouth Every Am 6)  Hydrochlorothiazide 12.5 Mg  Tabs (Hydrochlorothiazide) .... Take 1 Tablet By Mouth Once A Day 7)  Klor-Con M20 20 Meq  Tbcr (Potassium Chloride Crys Cr) .... Take 1 Tablet By Mouth Once A Day 8)  Calcium 500/d 500-200 Mg-Unit  Tabs (Calcium Carbonate-Vitamin D) .... Once Daily 9)  Clarinex 5 Mg  Tabs (Desloratadine) .Marland Kitchen.. 1 By Mouth Once Daily As Needed Allergies 10)  Aspirin 81 Mg Tabs (Aspirin) .Marland Kitchen.. 1 By Mouth Once Daily With Food 11)  Nystatin 100000 Unit/gm Crea (Nystatin) .... Apply To Rash Under Right Arm and Under Breasts (Red Area) Twice Daily Until Better As Needed 12)  Metformin Hcl 500 Mg  Tabs (Metformin Hcl) .... Take One By Mouth Two Times A Day 13)  Triple Abt Oint .... Apply Twice A Day To Lesion Until Healed As Needed 14)  Metamucil 30.9 % Powd (Psyllium) .... Otc As Directed. 15)  Flonase 50 Mcg/act Susp (Fluticasone Propionate) .Marland Kitchen.. 1 Spray in Each Nostril Once Daily For 2 Weeks For Ear Problem  Stop It If You Get A Nosebleed As Needed 16)  Probiotic  Caps (Probiotic Product) .... Take 1 Capsule By Mouth Once A Day 17)  Ventolin Hfa 108 (90 Base) Mcg/act Aers (Albuterol Sulfate) .... Take 2 Puffs By Mouth Every 4 Hours As Needed For Shortness of Breath or Cough 18)  Pepto-Bismol Max Strength 525 Mg/106ml Susp (Bismuth Subsalicylate) .... Otc As Directed.  Allergies: 1)  ! Augmentin 2)  * Ace Inhibitors Group 3)  Ibuprofen (Ibuprofen)  Past History:  Past Medical History: Last updated: 10/02/2009 GERD Hyperlipidemia Hypertension obesity MR hyperglycemia all rhinitis OA in R foot glaucoma chronic low back pain with radiculopathy (deg disk)  podiatry Dr Ralene Cork dentist Dr Ulysees Barns Dr Dione Booze derm-- Dr Terri Piedra ortho - [facility]  Social History: Last updated: 08/13/2007 lives alone- with help from neighbors currently- disability for arm fx non smoker no alcohol   Review of  Systems       per HPI  Physical Exam  General:  overweight but generally well appearing  Mouth:  pharynx pink and moist.   Abdomen:  Bowel sounds positive,abdomen soft and non-tender without masses, organomegaly or hernias noted.  no rebound/guarding Pulses:  2+ rad pulses Extremities:  No clubbing, cyanosis, edema, or deformity noted with normal full range of motion of all joints.     Impression & Recommendations:  Problem # 1:  DIARRHEA (ICD-787.91) x2 this morning.  no abd pain, no f/c/n/v.  possibly from too soon transition to fatty food after several days of BRAT diet?  reassured.  RTC for red flags.  advised slowly advance diet.  hold off on fried chicken until 3-4  days.  Her updated medication list for this problem includes:    Probiotic Caps (Probiotic product) .Marland Kitchen... Take 1 capsule by mouth once a day    Pepto-bismol Max Strength 525 Mg/23ml Susp (Bismuth subsalicylate) ..... Otc as directed.  Complete Medication List: 1)  Desonide 0.05 % Crea (Desonide) 2)  Ketoconazole 2 % Crea (Ketoconazole) .... Apply 1 a small amount to affected area twice a day as needed 3)  Cartia Xt 240 Mg Cp24 (Diltiazem hcl coated beads) .Marland Kitchen.. 1 by mouth once daily 4)  Lipitor 20 Mg Tabs (Atorvastatin calcium) .... Take one by mouth daily 5)  Omeprazole 20 Mg Cpdr (Omeprazole) .Marland Kitchen.. 1 by mouth every am 6)  Hydrochlorothiazide 12.5 Mg Tabs (Hydrochlorothiazide) .... Take 1 tablet by mouth once a day 7)  Klor-con M20 20 Meq Tbcr (Potassium chloride crys cr) .... Take 1 tablet by mouth once a day 8)  Calcium 500/d 500-200 Mg-unit Tabs (Calcium carbonate-vitamin d) .... Once daily 9)  Clarinex 5 Mg Tabs (Desloratadine) .Marland Kitchen.. 1 by mouth once daily as needed allergies 10)  Aspirin 81 Mg Tabs (Aspirin) .Marland Kitchen.. 1 by mouth once daily with food 11)  Nystatin 100000 Unit/gm Crea (Nystatin) .... Apply to rash under right arm and under breasts (red area) twice daily until better as needed 12)  Metformin Hcl 500 Mg Tabs (Metformin hcl) .... Take one by mouth two times a day 13)  Triple Abt Oint  .... Apply twice a day to lesion until healed as needed 14)  Metamucil 30.9 % Powd (Psyllium) .... Otc as directed. 15)  Flonase 50 Mcg/act Susp (Fluticasone propionate) .Marland Kitchen.. 1 spray in each nostril once daily for 2 weeks for ear problem  stop it if you get a nosebleed as needed 16)  Probiotic Caps (Probiotic product) .... Take 1 capsule by mouth once a day 17)  Ventolin Hfa 108 (90 Base) Mcg/act Aers (Albuterol sulfate) .... Take 2 puffs by mouth every 4 hours as needed for shortness of breath or cough 18)  Pepto-bismol Max Strength 525 Mg/20ml Susp (Bismuth subsalicylate) .... Otc as  directed.  Patient Instructions: 1)  May have been [employer] (too fatty).   2)  Slowly advance diet.  go back to bland diet (baked potato, bread, crackers, gingerale, rice, yogurt) 3)  Good to see yo utoday. 4)  Call us or return if fevers, blood in stool, or belly pain.   Orders Added: 1)  Est. Patient Level III [57846]    Current Allergies (reviewed today): ! AUGMENTIN * ACE INHIBITORS GROUP IBUPROFEN (IBUPROFEN)

## 2010-02-19 NOTE — Miscellaneous (Signed)
Summary: Ketoconazole 2% shampoo med list update  Clinical Lists Changes  Medications: Added new medication of KETOCONAZOLE 2 % SHAM (KETOCONAZOLE) use as directed twice weekly as needed     Current Allergies: ! AUGMENTIN * ACE INHIBITORS GROUP IBUPROFEN (IBUPROFEN)

## 2010-02-19 NOTE — Progress Notes (Signed)
Summary: Rx Tessalon Perles  Phone Note Refill Request Call back at [phone] Message [facility] on July 09, 2009 8:17 AM  Refills Requested: Medication #1:  TESSALON PERLES 100 MG CAPS 1 by mouth up to three times a day as needed cough - swallow whole Received E-script request, please advise.   Method Requested: Telephone to Pharmacy Initial call taken by: Linde Gillis CMA Duncan Dull),  July 09, 2009 8:17 AM    Prescriptions: TESSALON PERLES 100 MG CAPS (BENZONATATE) 1 by mouth up to three times a day as needed cough - swallow whole  #30 x 0   Entered and Authorized by:   Ruthe Mannan MD   Signed by:   Ruthe Mannan MD on 07/09/2009   Method used:   Electronically [facility]        [facility]  [place] (retail)       [address]       [place], [place]  [address]       Ph: [phone] or [phone]       Fax: [phone]   RxID:   [id number]

## 2010-02-19 NOTE — Progress Notes (Signed)
Summary: regarding meds  Phone Note Call from Patient   Summary of Call: Patient came in this morning and says that she is in the doughnut hole w/ her insurance. She was going to have to pay $50.00 for her med that are usually $2.00. She is asking if she could get samples or either try soemthing cheaper. She has an appt tomorrow, I told her to discuss it with you then. I just wanted to let you know ahead of time. The medications are Prilosec, Microzide, and Cardizem. Initial call taken by: Melody Comas,  October 31, 2009 4:10 PM  Follow-up for Phone Call        will disc that with her at visit tomorrow - thanks for the heads up Follow-up by: Judith Part MD,  October 31, 2009 4:18 PM

## 2010-02-19 NOTE — Assessment & Plan Note (Signed)
Summary: STOMACH PAINS / LFW   Vital Signs:  Patient profile:   55 year old female Height:      62 inches Weight:      198.75 pounds BMI:     36.48 Temp:     97.4 degrees F oral Pulse rate:   80 / minute Pulse rhythm:   regular BP sitting:   114 / 68  (left arm) Cuff size:   large  Vitals Entered By: Lewanda Rife LPN (December 03, 2009 2:58 PM) CC: On 12/02/09 had diarrhea and did not make it to bathroom. Lower stomach hurt some last night.  No diarrhea today but feels weak.    History of Present Illness: still having trouble with her stomach   had diarrhea last week and yesterday had stool incontinence yesterday unsure how often diarrhea was before that  no diarrhea today but feels weak  has been taking some pepto and also drinking ginger ale   no stomach cramps  no stomach swelling   no diarrhea today  this am ate some grits   yesterday had eaten mac and cheese, spaghetti , deviled eggs     did get worse last week with some fast food / fatty food   has been off of her omeprazole for a while -- got confused about her medicines  thought that and the K were the same thing  did review all meds with pt today  Allergies: 1)  ! Augmentin 2)  * Ace Inhibitors Group 3)  Ibuprofen (Ibuprofen)  Past History:  Past Medical History: Last updated: 10/02/2009 GERD Hyperlipidemia Hypertension obesity MR hyperglycemia all rhinitis OA in R foot glaucoma chronic low back pain with radiculopathy (deg disk)  podiatry Dr Ralene Cork dentist Dr Ulysees Barns Dr Dione Booze derm-- Dr Terri Piedra ortho Ophelia Charter  Past Surgical History: Last updated: 03/23/2008 Hysterectomy arm fx- past cataract surgery   Family History: Last updated: 2009-03-09 mother DM- deceased father - deceased  aunt DM- deceased , CVA, pacemaker   Social History: Last updated: 08/13/2007 lives alone- with help from neighbors currently- disability for arm fx non smoker no alcohol   Review of  Systems General:  Denies chills, fatigue, fever, loss of appetite, and malaise. Eyes:  Denies eye irritation. CV:  Denies chest pain or discomfort, palpitations, and shortness of breath with exertion. Resp:  Denies cough and wheezing. GI:  Complains of diarrhea and indigestion; denies abdominal pain, bloody stools, dark tarry stools, nausea, and vomiting; occ indegestion- not today. Derm:  Denies itching, lesion(s), poor wound healing, and rash. Neuro:  Denies numbness and tingling. Endo:  Denies cold intolerance and heat intolerance.  Physical Exam  General:  overweight but generally well appearing  Head:  normocephalic, atraumatic, and no abnormalities observed.   Eyes:  vision grossly intact, pupils equal, pupils round, and pupils reactive to light.  no conjunctival pallor, injection or icterus  Mouth:  throat clear Neck:  supple with full rom and no masses or thyromegally, no JVD or carotid bruit  Lungs:  Normal respiratory effort, chest expands symmetrically. Lungs are clear to auscultation, no crackles or wheezes. Heart:  Normal rate and regular rhythm. S1 and S2 normal without gallop, murmur, click, rub or other extra sounds. Abdomen:  Bowel sounds positive,abdomen soft and non-tender without masses, organomegaly or hernias noted.  no rebound/guarding Skin:  Intact without suspicious lesions or rashes no jaundice or pallor brisk capillary refil time Cervical Nodes:  No lymphadenopathy noted Inguinal Nodes:  No significant adenopathy Psych:  baseline MR - repeats herself frequently   Impression & Recommendations:  Problem # 1:  DIARRHEA (ICD-787.91) Assessment Unchanged most likely ibs vs gastroenteritis  still eating too heavy (she sited spaghetti, mac and cheese, deviled eggs)  reviwed BRAT diet again  continue non sugar drinks  labs today ? if coincedence that she stopped her ppi (will be starting that back) pt aware to call if worse - update asap  Her updated  medication list for this problem includes:    Probiotic Caps (Probiotic product) .Marland Kitchen... Take 1 capsule by mouth once a day    Pepto-bismol Max Strength 525 Mg/20ml Susp (Bismuth subsalicylate) ..... Otc as directed.  Orders: Venipuncture (10175) TLB-BMP (Basic Metabolic Panel-BMET) (80048-METABOL) TLB-CBC Platelet - w/Differential (85025-CBCD) TLB-Hepatic/Liver Function Pnl (80076-HEPATIC)  Problem # 2:  GERD (ICD-530.81) Assessment: Deteriorated this has bothered her more off PPI explained she should go back on it dailly as she was  Her updated medication list for this problem includes:    Omeprazole 20 Mg Cpdr (Omeprazole) .Marland Kitchen... 1 by mouth every am  Complete Medication List: 1)  Desonide 0.05 % Crea (Desonide) 2)  Ketoconazole 2 % Crea (Ketoconazole) .... Apply 1 a small amount to affected area twice a day as needed 3)  Cartia Xt 240 Mg Cp24 (Diltiazem hcl coated beads) .Marland Kitchen.. 1 by mouth once daily 4)  Lipitor 20 Mg Tabs (Atorvastatin calcium) .... Take one by mouth daily 5)  Omeprazole 20 Mg Cpdr (Omeprazole) .Marland Kitchen.. 1 by mouth every am 6)  Hydrochlorothiazide 12.5 Mg Tabs (Hydrochlorothiazide) .... Take 1 tablet by mouth once a day 7)  Klor-con M20 20 Meq Tbcr (Potassium chloride crys cr) .... Take 1 tablet by mouth once a day 8)  Calcium 500/d 500-200 Mg-unit Tabs (Calcium carbonate-vitamin d) .... Once daily 9)  Clarinex 5 Mg Tabs (Desloratadine) .Marland Kitchen.. 1 by mouth once daily as needed allergies 10)  Aspirin 81 Mg Tabs (Aspirin) .Marland Kitchen.. 1 by mouth once daily with food 11)  Nystatin 100000 Unit/gm Crea (Nystatin) .... Apply to rash under right arm and under breasts (red area) twice daily until better as needed 12)  Metformin Hcl 500 Mg Tabs (Metformin hcl) .... Take one by mouth two times a day 13)  Triple Abt Oint  .... Apply twice a day to lesion until healed as needed 14)  Metamucil 30.9 % Powd (Psyllium) .... Otc as directed. 15)  Flonase 50 Mcg/act Susp (Fluticasone propionate) .Marland Kitchen.. 1  spray in each nostril once daily for 2 weeks for ear problem  stop it if you get a nosebleed as needed 16)  Probiotic Caps (Probiotic product) .... Take 1 capsule by mouth once a day 17)  Ventolin Hfa 108 (90 Base) Mcg/act Aers (Albuterol sulfate) .... Take 2 puffs by mouth every 4 hours as needed for shortness of breath or cough 18)  Pepto-bismol Max Strength 525 Mg/69ml Susp (Bismuth subsalicylate) .... Otc as directed.  Patient Instructions: 1)  get started back on your omeprazole one pill per day  2)  keep diet bland -- avoid fatty foods (again bananas , rice , applesauce and toast are best0  3)  make sure to drink enough fluid  4)  we will do some labs today  5)  you may still be getting over a stomach virus  6)  let me know if your stomach hurts or diarrhea worsens or fever or other symptoms    Orders Added: 1)  Venipuncture [36415] 2)  TLB-BMP (Basic Metabolic Panel-BMET) [80048-METABOL] 3)  TLB-CBC Platelet - w/Differential [85025-CBCD] 4)  TLB-Hepatic/Liver Function Pnl [80076-HEPATIC] 5)  Est. Patient Level III [62952]    Current Allergies (reviewed today): ! AUGMENTIN * ACE INHIBITORS GROUP IBUPROFEN (IBUPROFEN)

## 2010-02-19 NOTE — Progress Notes (Signed)
Summary: Benzonatate  Phone Note Refill Request Message from:  [employer] on May 07, 2009 9:15 AM  Refills Requested: Medication #1:  TESSALON PERLES 100 MG CAPS 1 by mouth up to three times a day as needed cough - swallow whole. [facility]  [address] [id number]   Last Fill Date:  No date sent   Pharmacy Phone:  [phone]   Method Requested: Electronic Initial call taken by: Delilah Shan CMA (AAMA),  May 07, 2009 9:15 AM    Prescriptions: TESSALON PERLES 100 MG CAPS (BENZONATATE) 1 by mouth up to three times a day as needed cough - swallow whole  #30 x 0   Entered and Authorized by:   Ruthe Mannan MD   Signed by:   Ruthe Mannan MD on 05/07/2009   Method used:   Electronically to        [facility]  [address] (retail)       [address]       [place], [place]  [address]       Ph: [phone] or [phone]       Fax: [phone]   RxID:   [id number]

## 2010-02-19 NOTE — Assessment & Plan Note (Signed)
Summary: STOMACH/CLE   Vital Signs:  Patient profile:   55 year old female Height:      62 inches Weight:      198.75 pounds BMI:     36.48 Temp:     97.5 degrees F oral Pulse rate:   96 / minute Pulse rhythm:   regular BP sitting:   130 / 100  (left arm) Cuff size:   large  Vitals Entered By: Lewanda Rife LPN (November 22, 2009 10:15 AM) CC: lower stomach feels like it is burning. Normal BM's, has al ittle gas. No UTI symptoms   History of Present Illness:  feels like she is getting a GI bug sunday was exposed to someone with laryngitis   now is having some discomfort in lower abd  is having regular bms  no blood in stool  no nausea or vomiting and did not eat anything unusual   no fever   is still taking metamucil regularly- this prevents constipation  Allergies: 1)  ! Augmentin 2)  * Ace Inhibitors Group 3)  Ibuprofen (Ibuprofen)  Past History:  Past Medical History: Last updated: 10/02/2009 GERD Hyperlipidemia Hypertension obesity MR hyperglycemia all rhinitis OA in R foot glaucoma chronic low back pain with radiculopathy (deg disk)  podiatry Dr Ralene Cork dentist Dr Ulysees Barns Dr Dione Booze derm-- Dr Terri Piedra ortho Ophelia Charter  Past Surgical History: Last updated: 03/23/2008 Hysterectomy arm fx- past cataract surgery   Family History: Last updated: 02-21-09 mother DM- deceased father - deceased  aunt DM- deceased , CVA, pacemaker   Social History: Last updated: 08/13/2007 lives alone- with help from neighbors currently- disability for arm fx non smoker no alcohol   Review of Systems General:  Denies chills, fatigue, fever, loss of appetite, and malaise. Eyes:  Denies blurring and eye irritation. CV:  Denies chest pain or discomfort, lightheadness, and palpitations. Resp:  Denies cough, shortness of breath, and wheezing. GI:  Complains of abdominal pain; denies change in bowel habits, dark tarry stools, diarrhea, indigestion, nausea, and  vomiting. GU:  Denies dysuria, hematuria, and urinary frequency. Derm:  Denies itching and rash. Endo:  Denies cold intolerance and heat intolerance.  Physical Exam  General:  overweight but generally well appearing  Head:  normocephalic, atraumatic, and no abnormalities observed.   Eyes:  vision grossly intact, pupils equal, pupils round, and pupils reactive to light.  no conjunctival pallor, injection or icterus  Mouth:  pharynx pink and moist.   Neck:  supple with full rom and no masses or thyromegally, no JVD or carotid bruit  Chest Wall:  No deformities, masses, or tenderness noted. Lungs:  Normal respiratory effort, chest expands symmetrically. Lungs are clear to auscultation, no crackles or wheezes. Heart:  Normal rate and regular rhythm. S1 and S2 normal without gallop, murmur, click, rub or other extra sounds. Abdomen:  Bowel sounds positive,abdomen soft and non-tender without masses, organomegaly or hernias noted. no renal bruits  no gaurding or rebound  Extremities:  No clubbing, cyanosis, edema, or deformity noted with normal full range of motion of all joints.   Skin:  Intact without suspicious lesions or rashes no pallor or jaundice Cervical Nodes:  No lymphadenopathy noted Inguinal Nodes:  No significant adenopathy Psych:  normal affect, talkative and pleasant - baseline MR    Impression & Recommendations:  Problem # 1:  ABDOMINAL PAIN, LOWER (ICD-789.09) Assessment New very mild with gurgling and no other symptoms nl exam today expl to pt that this could be early  GI virus or food intol or poss IBS enc to continue metamucil regulalry to expect diarrhea possibly rev liquid and then brat diet and rest  will keep me updated  knows to call if fever or worse abd pain or vomiting  Her updated medication list for this problem includes:    Aspirin 81 Mg Tabs (Aspirin) .Marland Kitchen... 1 by mouth once daily with food  Complete Medication List: 1)  Desonide 0.05 % Crea  (Desonide) 2)  Ketoconazole 2 % Crea (Ketoconazole) .... Apply 1 a small amount to affected area twice a day as needed 3)  Cartia Xt 240 Mg Cp24 (Diltiazem hcl coated beads) .Marland Kitchen.. 1 by mouth once daily 4)  Lipitor 20 Mg Tabs (Atorvastatin calcium) .... Take one by mouth daily 5)  Omeprazole 20 Mg Cpdr (Omeprazole) .Marland Kitchen.. 1 by mouth every am 6)  Hydrochlorothiazide 12.5 Mg Tabs (Hydrochlorothiazide) .... Take 1 tablet by mouth once a day 7)  Klor-con M20 20 Meq Tbcr (Potassium chloride crys cr) .... Take 1 tablet by mouth once a day 8)  Calcium 500/d 500-200 Mg-unit Tabs (Calcium carbonate-vitamin d) .... Once daily 9)  Clarinex 5 Mg Tabs (Desloratadine) .Marland Kitchen.. 1 by mouth once daily as needed allergies 10)  Aspirin 81 Mg Tabs (Aspirin) .Marland Kitchen.. 1 by mouth once daily with food 11)  Nystatin 100000 Unit/gm Crea (Nystatin) .... Apply to rash under right arm and under breasts (red area) twice daily until better as needed 12)  Metformin Hcl 500 Mg Tabs (Metformin hcl) .... Take one by mouth two times a day 13)  Triple Abt Oint  .... Apply twice a day to lesion until healed as needed 14)  Metamucil 30.9 % Powd (Psyllium) .... Otc as directed. 15)  Flonase 50 Mcg/act Susp (Fluticasone propionate) .Marland Kitchen.. 1 spray in each nostril once daily for 2 weeks for ear problem  stop it if you get a nosebleed as needed 16)  Probiotic Caps (Probiotic product) .... Take 1 capsule by mouth once a day 17)  Ventolin Hfa 108 (90 Base) Mcg/act Aers (Albuterol sulfate) .... Take 2 puffs by mouth every 4 hours as needed for shortness of breath or cough  Patient Instructions: 1)  you may be getting an early GI virus -- so do not be suprised if you get some diarrhea  2)  today just drink clear fluids and broth- no food 3)  tomorrow if you are feeling better - you can have bananas / applesauce/ toast and rice  4)  as you feel better - go back to regular diet  5)  tylenol ok if fever 6)  update me if this does not improve    Orders  Added: 1)  Est. Patient Level III [16109]    Current Allergies (reviewed today): ! AUGMENTIN * ACE INHIBITORS GROUP IBUPROFEN (IBUPROFEN)

## 2010-02-19 NOTE — Assessment & Plan Note (Signed)
Summary: ?UTI/CLE   Vital Signs:  Patient profile:   55 year old female Height:      62 inches Weight:      201.25 pounds BMI:     36.94 Temp:     97.4 degrees F oral Pulse rate:   84 / minute Pulse rhythm:   regular BP sitting:   118 / 84  (left arm) Cuff size:   large  Vitals Entered By: Lewanda Rife LPN (November 08, 2009 8:24 AM) CC: ?UTI  low back pain on lt side   History of Present Illness: is having some pain in left lower back in past this has been caused from uti -- this feels the same  no burning or frequency  no blood in urine   does not hurt to move it bothers her more to urinate   Allergies: 1)  ! Augmentin 2)  * Ace Inhibitors Group 3)  Ibuprofen (Ibuprofen)  Past History:  Past Medical History: Last updated: 10/02/2009 GERD Hyperlipidemia Hypertension obesity MR hyperglycemia all rhinitis OA in R foot glaucoma chronic low back pain with radiculopathy (deg disk)  podiatry Dr Ralene Cork dentist Dr Ulysees Barns Dr Dione Booze derm-- Dr Terri Piedra ortho Ophelia Charter  Past Surgical History: Last updated: 03/23/2008 Hysterectomy arm fx- past cataract surgery   Family History: Last updated: 03-02-2009 mother DM- deceased father - deceased  aunt DM- deceased , CVA, pacemaker   Social History: Last updated: 08/13/2007 lives alone- with help from neighbors currently- disability for arm fx non smoker no alcohol   Review of Systems General:  Denies chills, fatigue, fever, loss of appetite, and malaise. Eyes:  Denies blurring and eye irritation. CV:  Denies chest pain or discomfort, lightheadness, and palpitations. Resp:  Denies cough and wheezing. GI:  Denies abdominal pain, change in bowel habits, nausea, and vomiting. GU:  Denies dysuria, hematuria, urinary frequency, and urinary hesitancy. MS:  Complains of low back pain; denies cramps and stiffness. Derm:  Denies itching.  Physical Exam  General:  overweight but generally well appearing  Head:   normocephalic, atraumatic, and no abnormalities observed.  no sinus tenderness Mouth:  pharynx pink and moist.   Neck:  supple with full rom and no masses or thyromegally, no JVD or carotid bruit  Lungs:  Normal respiratory effort, chest expands symmetrically. Lungs are clear to auscultation, no crackles or wheezes. Heart:  Normal rate and regular rhythm. S1 and S2 normal without gallop, murmur, click, rub or other extra sounds. Abdomen:  no suprapubic tenderness or fullness felt  Msk:  tender musculature over L lumbar area  no spinal tenderness (nl rom) no cva tenderness Extremities:  no cce  Skin:  Intact without suspicious lesions or rashes Cervical Nodes:  No lymphadenopathy noted Inguinal Nodes:  No significant adenopathy Psych:  normal affect, talkative and pleasant - baseline MR    Impression & Recommendations:  Problem # 1:  BACK PAIN, LUMBAR (ICD-724.2) Assessment New L lower back pain- in past she has had uti with this ua is borderline and pt has no voiding or bladder symptoms  will cx urine and update adv to drink water/use gentle heat on back and update asap is worse symptoms Her updated medication list for this problem includes:    Aspirin 81 Mg Tabs (Aspirin) .Marland Kitchen... 1 by mouth once daily with food  Orders: T-Culture, Urine (16109-60454) UA Dipstick W/ Micro (manual) (09811)  Complete Medication List: 1)  Desonide 0.05 % Crea (Desonide) 2)  Ketoconazole 2 % Crea (Ketoconazole) .Marland KitchenMarland KitchenMarland Kitchen  Apply 1 a small amount to affected area twice a day as needed 3)  Cartia Xt 240 Mg Cp24 (Diltiazem hcl coated beads) .Marland Kitchen.. 1 by mouth once daily 4)  Lipitor 20 Mg Tabs (Atorvastatin calcium) .... Take one by mouth daily 5)  Omeprazole 20 Mg Cpdr (Omeprazole) .Marland Kitchen.. 1 by mouth every am 6)  Hydrochlorothiazide 12.5 Mg Tabs (Hydrochlorothiazide) .... Take 1 tablet by mouth once a day 7)  Klor-con M20 20 Meq Tbcr (Potassium chloride crys cr) .... Take 1 tablet by mouth once a day 8)  Calcium  500/d 500-200 Mg-unit Tabs (Calcium carbonate-vitamin d) .... Once daily 9)  Clarinex 5 Mg Tabs (Desloratadine) .Marland Kitchen.. 1 by mouth once daily as needed allergies 10)  Aspirin 81 Mg Tabs (Aspirin) .Marland Kitchen.. 1 by mouth once daily with food 11)  Nystatin 100000 Unit/gm Crea (Nystatin) .... Apply to rash under right arm and under breasts (red area) twice daily until better 12)  Metformin Hcl 500 Mg Tabs (Metformin hcl) .... Take one by mouth two times a day 13)  Triple Abt Oint  .... Apply twice a day to lesion until healed as needed 14)  Metamucil 30.9 % Powd (Psyllium) .... Otc as directed. 15)  Flonase 50 Mcg/act Susp (Fluticasone propionate) .Marland Kitchen.. 1 spray in each nostril once daily for 2 weeks for ear problem  stop it if you get a nosebleed 16)  Probiotic Caps (Probiotic product) .... Take 1 capsule by mouth once a day 17)  Ventolin Hfa 108 (90 Base) Mcg/act Aers (Albuterol sulfate) .... Take 2 puffs by mouth every 4 hours as needed for shortness of breath or cough  Patient Instructions: 1)  keep drinking lots of water  2)  if symptoms worsen let me know  3)  I will send urine for culture and call you as soon as it comes back  4)  use some heat on your back    Orders Added: 1)  T-Culture, Urine [[id number]] 2)  Est. Patient Level III [09811] 3)  UA Dipstick W/ Micro (manual) [81000]    Current Allergies (reviewed today): ! AUGMENTIN * ACE INHIBITORS GROUP IBUPROFEN (IBUPROFEN)  Laboratory Results   Urine Tests  Date/Time Received: November 08, 2009 8:26 AM  Date/Time Reported: November 08, 2009 8:26 AM   Routine Urinalysis   Color: yellow Appearance: slightly hazy Glucose: negative   (Normal Range: Negative) Bilirubin: negative   (Normal Range: Negative) Ketone: negative   (Normal Range: Negative) Spec. Gravity: 1.020   (Normal Range: 1.003-1.035) Blood: trace-lysed   (Normal Range: Negative) pH: 6.0   (Normal Range: 5.0-8.0) Protein: trace   (Normal Range:  Negative) Urobilinogen: 0.2   (Normal Range: 0-1) Nitrite: negative   (Normal Range: Negative) Leukocyte Esterace: trace   (Normal Range: Negative)  Urine Microscopic WBC/HPF: 1-2 RBC/HPF: 0 Bacteria/HPF: few Mucous/HPF: few Epithelial/HPF: 0-1 Crystals/HPF: 0 Casts/LPF: 0 Yeast/HPF: 0 Other: 0

## 2010-02-19 NOTE — Miscellaneous (Signed)
  Clinical Lists Changes  Observations: Added new observation of MAMMO DUE: 03/14/2010 (03/14/2009 9:56) Added new observation of MAMMOGRAM: Normal (03/14/2009 9:56)

## 2010-02-19 NOTE — Assessment & Plan Note (Signed)
Summary: F/U FROM ER VISIT -[facility]   Vital Signs:  Patient profile:   55 year old female Height:      62 inches Weight:      227.75 pounds BMI:     41.81 Temp:     97.6 degrees F oral Pulse rate:   64 / minute Pulse rhythm:   regular BP sitting:   140 / 82  (left arm) Cuff size:   large  Vitals Entered By: Linde Gillis CMA Duncan Dull) (March 12, 2009 9:04 AM) CC: ER follow up   History of Present Illness: was in ER for cough and congestion given albuterol mdi with spacer and prednisone  did a chest x ray too and did some blood work (per pt )   is overall feeling much better  was told she had a touch of bronchitis  was given azithromycin - and she thinks that has helped   no more bad cough and no fever at all    Allergies: 1)  ! Augmentin 2)  * Ace Inhibitors Group 3)  Ibuprofen (Ibuprofen)  Past History:  Past Medical History: Last updated: 03/23/2008 GERD Hyperlipidemia Hypertension obesity MR hyperglycemia all rhinitis OA in R foot glaucoma   podiatry Dr Ralene Cork dentist Dr Ulysees Barns Dr Dione Booze derm-- Dr Terri Piedra  Past Surgical History: Last updated: 03/23/2008 Hysterectomy arm fx- past cataract surgery   Family History: Last updated: 2009-03-04 mother DM- deceased father - deceased  aunt DM- deceased , CVA, pacemaker   Social History: Last updated: 08/13/2007 lives alone- with help from neighbors currently- disability for arm fx non smoker no alcohol   Review of Systems General:  Denies chills, fatigue, fever, loss of appetite, and malaise. Eyes:  Denies blurring and eye irritation. ENT:  Complains of postnasal drainage; denies sinus pressure and sore throat. CV:  Denies chest pain or discomfort and palpitations. Resp:  Complains of cough; denies pleuritic, shortness of breath, sputum productive, and wheezing. GI:  Denies abdominal pain, change in bowel habits, and nausea. Derm:  Denies itching, lesion(s), poor wound healing, and  rash. Neuro:  Denies numbness and tingling.  Physical Exam  General:  overweight but generally well appearing hygiene is fair  Head:  normocephalic, atraumatic, and no abnormalities observed.  no sinus tenderness  Eyes:  vision grossly intact, pupils equal, pupils round, pupils reactive to light, and no injection.   Ears:  R ear normal and L ear normal.   Nose:  nares are boggy but clear  Mouth:  pharynx pink and moist, no erythema, and no exudates.   Neck:  No deformities, masses, or tenderness noted. Lungs:  Normal respiratory effort, chest expands symmetrically. Lungs are clear to auscultation, no crackles or wheezes. Heart:  Normal rate and regular rhythm. S1 and S2 normal without gallop, murmur, click, rub or other extra sounds. Skin:  Intact without suspicious lesions or rashes Cervical Nodes:  No lymphadenopathy noted Psych:  baseline affect/ MR    Impression & Recommendations:  Problem # 1:  BRONCHITIS- ACUTE (ICD-466.0) Assessment Improved is overall almost resolved s/p course of azithromycin/ albuterol and short pred taper will continue good fluids and rest update if symptoms return or any wheezing   Complete Medication List: 1)  Desonide 0.05 % Crea (Desonide) 2)  Ketoconazole 2 % Crea (Ketoconazole) .... Apply 1 a small amount to affected area twice a day as needed 3)  Cartia Xt 240 Mg Cp24 (Diltiazem hcl coated beads) .Marland Kitchen.. 1 by mouth once daily 4)  Lipitor  20 Mg Tabs (Atorvastatin calcium) .... Take one by mouth daily 5)  Omeprazole 20 Mg Cpdr (Omeprazole) .Marland Kitchen.. 1 by mouth every am 6)  Hydrochlorothiazide 12.5 Mg Tabs (Hydrochlorothiazide) .... Take 1 tablet by mouth once a day 7)  Klor-con M20 20 Meq Tbcr (Potassium chloride crys cr) .... Take 1 tablet by mouth once a day 8)  Calcium 500/d 500-200 Mg-unit Tabs (Calcium carbonate-vitamin d) .... Once daily 9)  Ketoconazole 2 % Sham (Ketoconazole) .... Use as directed twice weekly prn 10)  Clarinex 5 Mg Tabs  (Desloratadine) .Marland Kitchen.. 1 by mouth once daily as needed allergies 11)  Aspirin 81 Mg Tabs (Aspirin) .Marland Kitchen.. 1 by mouth once daily with food 12)  Nystatin 100000 Unit/gm Crea (Nystatin) .... Apply to rash under right arm and under breasts (red area) twice daily until better 13)  Metformin Hcl 500 Mg Tabs (Metformin hcl) .... Take one by mouth two times a day 14)  Triple Abt Oint  .... Apply twice a day to lesion until healed  Patient Instructions: 1)  your bronchitis is much better  2)  update me if symptoms return  3)  get a lot of liquids and good sleep   Current Allergies (reviewed today): ! AUGMENTIN * ACE INHIBITORS GROUP IBUPROFEN (IBUPROFEN)

## 2010-02-19 NOTE — Assessment & Plan Note (Signed)
Summary: COUGH/CLE   Vital Signs:  Patient profile:   55 year old female Height:      62 inches Weight:      229.50 pounds BMI:     42.13 Temp:     97.6 degrees F oral Pulse rate:   72 / minute Pulse rhythm:   regular BP sitting:   130 / 90  (left arm) Cuff size:   regular  Vitals Entered By: Linde Gillis CMA Duncan Dull) (April 13, 2009 8:30 AM) CC: cough   History of Present Illness: here for cough  was seen in ER for cough and runny nose in 3/9-- presumed viral and no meds given  cough is not getting better in general  yesterday am - worse cough in the am  had some prod of phlegm - yellow in color  no sob no fever or wheezing   coughed so hard she vomited once   no runny or stuffy nose - but has sneezed   is taking all of her medicines   Allergies: 1)  ! Augmentin 2)  * Ace Inhibitors Group 3)  Ibuprofen (Ibuprofen)  Past History:  Past Medical History: Last updated: 03/23/2008 GERD Hyperlipidemia Hypertension obesity MR hyperglycemia all rhinitis OA in R foot glaucoma   podiatry Dr Ralene Cork dentist Dr Ulysees Barns Dr Dione Booze derm-- Dr Terri Piedra  Past Surgical History: Last updated: 03/23/2008 Hysterectomy arm fx- past cataract surgery   Family History: Last updated: 02/10/2009 mother DM- deceased father - deceased  aunt DM- deceased , CVA, pacemaker   Social History: Last updated: 08/13/2007 lives alone- with help from neighbors currently- disability for arm fx non smoker no alcohol   Review of Systems General:  Denies chills, fatigue, fever, loss of appetite, and malaise. Eyes:  Denies blurring and discharge. ENT:  Complains of postnasal drainage; denies sinus pressure and sore throat. CV:  Denies chest pain or discomfort and palpitations. Resp:  Complains of cough and sputum productive; denies coughing up blood, pleuritic, shortness of breath, and wheezing. GI:  Denies diarrhea and nausea. Derm:  Denies rash.  Physical Exam  General:   overweight but generally well appearing hygiene is fair  Head:  normocephalic, atraumatic, and no abnormalities observed.  no sinus tenderness  Eyes:  vision grossly intact, pupils equal, pupils round, pupils reactive to light, and no injection.   Ears:  R ear normal and L ear normal.   Nose:  nares boggy but clear  Mouth:  pharynx pink and moist, no erythema, and no exudates.   Neck:  supple with full rom and no masses or thyromegally, no JVD or carotid bruit  Chest Wall:  No deformities, masses, or tenderness noted. Lungs:  CTA with harsh bs at bases no rales or rhonchi  no wheeze- even on forced exp occ dry cough Heart:  Normal rate and regular rhythm. S1 and S2 normal without gallop, murmur, click, rub or other extra sounds. Skin:  Intact without suspicious lesions or rashes Cervical Nodes:  No lymphadenopathy noted Psych:  baseline affect- MR    Impression & Recommendations:  Problem # 1:  BRONCHITIS- ACUTE (ICD-466.0) Assessment New mild after approx 2 weeks of uri and cough turning productive will tx with zpack and also tessalon as needed  pt advised to update me if symptoms worsen or do not improve - esp if any wheeze or fever recommend sympt care- see pt instructions   Her updated medication list for this problem includes:    Zithromax Z-pak 250 Mg  Tabs (Azithromycin) .Marland Kitchen... Take by mouth as directed    Tessalon Perles 100 Mg Caps (Benzonatate) .Marland Kitchen... 1 by mouth up to three times a day as needed cough - swallow whole  Complete Medication List: 1)  Desonide 0.05 % Crea (Desonide) 2)  Ketoconazole 2 % Crea (Ketoconazole) .... Apply 1 a small amount to affected area twice a day as needed 3)  Cartia Xt 240 Mg Cp24 (Diltiazem hcl coated beads) .Marland Kitchen.. 1 by mouth once daily 4)  Lipitor 20 Mg Tabs (Atorvastatin calcium) .... Take one by mouth daily 5)  Omeprazole 20 Mg Cpdr (Omeprazole) .Marland Kitchen.. 1 by mouth every am 6)  Hydrochlorothiazide 12.5 Mg Tabs (Hydrochlorothiazide) .... Take 1  tablet by mouth once a day 7)  Klor-con M20 20 Meq Tbcr (Potassium chloride crys cr) .... Take 1 tablet by mouth once a day 8)  Calcium 500/d 500-200 Mg-unit Tabs (Calcium carbonate-vitamin d) .... Once daily 9)  Ketoconazole 2 % Sham (Ketoconazole) .... Use as directed twice weekly prn 10)  Clarinex 5 Mg Tabs (Desloratadine) .Marland Kitchen.. 1 by mouth once daily as needed allergies 11)  Aspirin 81 Mg Tabs (Aspirin) .Marland Kitchen.. 1 by mouth once daily with food 12)  Nystatin 100000 Unit/gm Crea (Nystatin) .... Apply to rash under right arm and under breasts (red area) twice daily until better 13)  Metformin Hcl 500 Mg Tabs (Metformin hcl) .... Take one by mouth two times a day 14)  Triple Abt Oint  .... Apply twice a day to lesion until healed as needed 15)  Diflucan 150 Mg Tabs (Fluconazole) .Marland Kitchen.. 1 by mouth times one for yeast infection 16)  Zithromax Z-pak 250 Mg Tabs (Azithromycin) .... Take by mouth as directed 17)  Tessalon Perles 100 Mg Caps (Benzonatate) .Marland Kitchen.. 1 by mouth up to three times a day as needed cough - swallow whole  Patient Instructions: 1)  you can try mucinex over the counter twice daily as directed and nasal saline spray for congestion 2)  tylenol over the counter as directed may help with aches, headache and fever 3)  call if symptoms worsen or if not improved in 4-5 days  4)  try the tessalon for cough 5)  take the zithromax as directed for bronchitis Prescriptions: TESSALON PERLES 100 MG CAPS (BENZONATATE) 1 by mouth up to three times a day as needed cough - swallow whole  #30 x 0   Entered and Authorized by:   Judith Part MD   Signed by:   Judith Part MD on 04/13/2009   Method used:   Print then Give to Patient   RxID:   [id number] ZITHROMAX Z-PAK 250 MG TABS (AZITHROMYCIN) take by mouth as directed  #1 pack x 0   Entered and Authorized by:   Judith Part MD   Signed by:   Judith Part MD on 04/13/2009   Method used:   Print then Give to Patient   RxID:    [id number]   Current Allergies (reviewed today): ! AUGMENTIN * ACE INHIBITORS GROUP IBUPROFEN (IBUPROFEN)

## 2010-02-19 NOTE — Assessment & Plan Note (Signed)
Summary: needs to get h andicap sign/dlo   Vital Signs:  Patient profile:   55 year old female Weight:      206.75 pounds Temp:     98.1 degrees F oral Pulse rate:   92 / minute Pulse rhythm:   regular BP sitting:   110 / 80  (left arm) Cuff size:   large  Vitals Entered By: Selena Batten Dance CMA Duncan Dull) (October 02, 2009 1:53 PM) CC: Needs to discuss handicapped tag   History of Present Illness: went to ER sunday a week ago for back pain  did treat her for urinary infx  the pill mader her vomit - so she finished it in 1/2 pills   then went to see Dr Ophelia Charter about her back last tuesday was told her L leg is weaker than her R leg  he told her to be careful and not walk in a hurry-- is worried that she will fall  she thinks she needs to get a handicapped sticker for parking so she does not have to walk as long and leg / back do not hurt as much  she does take her time walking  thinks it is from bad disks in back?  will follow up in 3 months   has already broken arm -- and afraid to fall   her hand is peeling again L one  using hand lotion and gloves now - and is getting better  also uses a steroid cream from eye doctor for dermatitis under eye mometasone furoate    Allergies: 1)  ! Augmentin 2)  * Ace Inhibitors Group 3)  Ibuprofen (Ibuprofen)  Past History:  Past Surgical History: Last updated: 03/23/2008 Hysterectomy arm fx- past cataract surgery   Family History: Last updated: 02/25/2009 mother DM- deceased father - deceased  aunt DM- deceased , CVA, pacemaker   Social History: Last updated: 08/13/2007 lives alone- with help from neighbors currently- disability for arm fx non smoker no alcohol   Past Medical History: GERD Hyperlipidemia Hypertension obesity MR hyperglycemia all rhinitis OA in R foot glaucoma chronic low back pain with radiculopathy (deg disk)  podiatry Dr Ralene Cork dentist Dr Ulysees Barns Dr Dione Booze derm-- Dr Terri Piedra ortho -  Ophelia Charter  Review of Systems General:  Denies chills, fatigue, fever, loss of appetite, and malaise. Eyes:  Denies blurring and eye irritation; some dryness of skin under eyes . CV:  Denies chest pain or discomfort and palpitations. Resp:  Denies cough and shortness of breath. GI:  Denies nausea and vomiting; is better taking 1/2 pill of cipro at a time. GU:  Denies dysuria, hematuria, and urinary frequency. MS:  Complains of low back pain, muscle weakness, and stiffness; denies muscle aches and cramps. Derm:  Complains of itching; denies poor wound healing and rash. Neuro:  Denies numbness and tingling. Heme:  Denies abnormal bruising and bleeding.  Physical Exam  General:  overweight but generally well appearing hygiene is fair  Head:  normocephalic, atraumatic, and no abnormalities observed.   Mouth:  pharynx pink and moist.   Neck:  supple with full rom and no masses or thyromegally, no JVD or carotid bruit  Chest Wall:  No deformities, masses, or tenderness noted. Lungs:  Normal respiratory effort, chest expands symmetrically. Lungs are clear to auscultation, no crackles or wheezes. Heart:  Normal rate and regular rhythm. S1 and S2 normal without gallop, murmur, click, rub or other extra sounds. Msk:  no LS tenderness today fair rom -- almost full flexion  nl SLR  Neurologic:  grossly symmetric strength in feet except for toe raise (4/5 on L)  gait is slow and wide based  Skin:  mild peeling - palm of L hand with diffusely dry skin Cervical Nodes:  No lymphadenopathy noted Psych:  normal affect, talkative and pleasant - baseline MR    Impression & Recommendations:  Problem # 1:  BACK PAIN (ICD-724.5) Assessment Deteriorated acute on chronic and intermittent per pt some leg weakness - suspect radiculopathy  will send for note from Dr Ophelia Charter agreed to fill out form for handicapped sticker - just to use on days when pain or weakness keep her from walking long distances Her  updated medication list for this problem includes:    Aspirin 81 Mg Tabs (Aspirin) .Marland Kitchen... 1 by mouth once daily with food  Problem # 2:  DRY SKIN (ICD-701.1) Assessment: Unchanged adv to continue regular moisturizers (fragrance free) on hands for occ peeling / cracking   Complete Medication List: 1)  Desonide 0.05 % Crea (Desonide) 2)  Ketoconazole 2 % Crea (Ketoconazole) .... Apply 1 a small amount to affected area twice a day as needed 3)  Cartia Xt 240 Mg Cp24 (Diltiazem hcl coated beads) .Marland Kitchen.. 1 by mouth once daily 4)  Lipitor 20 Mg Tabs (Atorvastatin calcium) .... Take one by mouth daily 5)  Omeprazole 20 Mg Cpdr (Omeprazole) .Marland Kitchen.. 1 by mouth every am 6)  Hydrochlorothiazide 12.5 Mg Tabs (Hydrochlorothiazide) .... Take 1 tablet by mouth once a day 7)  Klor-con M20 20 Meq Tbcr (Potassium chloride crys cr) .... Take 1 tablet by mouth once a day 8)  Calcium 500/d 500-200 Mg-unit Tabs (Calcium carbonate-vitamin d) .... Once daily 9)  Clarinex 5 Mg Tabs (Desloratadine) .Marland Kitchen.. 1 by mouth once daily as needed allergies 10)  Aspirin 81 Mg Tabs (Aspirin) .Marland Kitchen.. 1 by mouth once daily with food 11)  Nystatin 100000 Unit/gm Crea (Nystatin) .... Apply to rash under right arm and under breasts (red area) twice daily until better 12)  Metformin Hcl 500 Mg Tabs (Metformin hcl) .... Take one by mouth two times a day 13)  Triple Abt Oint  .... Apply twice a day to lesion until healed as needed 14)  Tessalon Perles 100 Mg Caps (Benzonatate) .Marland Kitchen.. 1 by mouth up to three times a day as needed cough - swallow whole 15)  Pepto-bismol Max Strength 525 Mg/5ml Susp (Bismuth subsalicylate) .... Otc as directed. 16)  Metamucil 30.9 % Powd (Psyllium) .... Otc as directed. 17)  Flonase 50 Mcg/act Susp (Fluticasone propionate) .Marland Kitchen.. 1 spray in each nostril once daily for 2 weeks for ear problem  stop it if you get a nosebleed 18)  Nitrofurantoin Macrocrystal 100 Mg Caps (Nitrofurantoin macrocrystal) .... Take one capsule  twice a day for 7 days 19)  Probiotic Caps (Probiotic product) .... Take 1 capsule by mouth once a day 20)  Loperamide Hcl 2 Mg Caps (Loperamide hcl) .... Take two caplets after 1st loose stool then 1 captlet after each subsequent loose stool, not to take more than 4 caplets in 24 hr period.  Patient Instructions: 1)  please send for last note from Dr Ophelia Charter- ortho- thanks  2)  use the handicapped sticker on the days when your back or leg are too painful or weak to walk far 3)  on better days - is ok to park far away  4)  take this form to the [facility] to get your sticker  5)  keep using moisturizer on your  hands  Current Allergies (reviewed today): ! AUGMENTIN * ACE INHIBITORS GROUP IBUPROFEN (IBUPROFEN)

## 2010-02-19 NOTE — Letter (Signed)
Summary: Medical Report Form/[facility]  Medical Report Form/[facility]   Imported By: Lanelle Bal 03/16/2009 10:03:49  _____________________________________________________________________  External Attachment:    Type:   Image     Comment:   External Document

## 2010-02-19 NOTE — Assessment & Plan Note (Signed)
Summary: NOT FELLING ANY BETTER/RBH   Vital Signs:  Patient profile:   55 year old female Weight:      197.75 pounds Temp:     97.4 degrees F oral Pulse rate:   96 / minute Pulse rhythm:   regular BP sitting:   116 / 78  (left arm) Cuff size:   large  Vitals Entered By: Selena Batten Dance CMA Duncan Dull) (November 16, 2009 2:44 PM) CC: Recheck urine   History of Present Illness: CC: f/u  Saw Dr. Milinda Antis last week with L leg pain, dx with UTI (has had similar presentation for UTI in past), tx with cipro 250mg  two times a day x 3 days.  Completed course.  This caused abd pain and diarrhea.  told to take yogurt by pharmacist which made sxs better.  Coming in today for follow up of all this.  Feeling better today.  No dysuria, frequency, polyuria.  No accidents.  No fevers/chills, abd pain, back pain.  No nausea/vomiting, no diarrhea.  Feeling normal.  Current Medications (verified): 1)  Desonide 0.05 % Crea (Desonide) 2)  Ketoconazole 2 % Crea (Ketoconazole) .... Apply 1 A Small Amount To Affected Area Twice A Day As Needed 3)  Cartia Xt 240 Mg  Cp24 (Diltiazem Hcl Coated Beads) .Marland Kitchen.. 1 By Mouth Once Daily 4)  Lipitor 20 Mg Tabs (Atorvastatin Calcium) .... Take One By Mouth Daily 5)  Omeprazole 20 Mg  Cpdr (Omeprazole) .Marland Kitchen.. 1 By Mouth Every Am 6)  Hydrochlorothiazide 12.5 Mg  Tabs (Hydrochlorothiazide) .... Take 1 Tablet By Mouth Once A Day 7)  Klor-Con M20 20 Meq  Tbcr (Potassium Chloride Crys Cr) .... Take 1 Tablet By Mouth Once A Day 8)  Calcium 500/d 500-200 Mg-Unit  Tabs (Calcium Carbonate-Vitamin D) .... Once Daily 9)  Clarinex 5 Mg  Tabs (Desloratadine) .Marland Kitchen.. 1 By Mouth Once Daily As Needed Allergies 10)  Aspirin 81 Mg Tabs (Aspirin) .Marland Kitchen.. 1 By Mouth Once Daily With Food 11)  Nystatin 100000 Unit/gm Crea (Nystatin) .... Apply To Rash Under Right Arm and Under Breasts (Red Area) Twice Daily Until Better 12)  Metformin Hcl 500 Mg Tabs (Metformin Hcl) .... Take One By Mouth Two Times A Day 13)   Triple Abt Oint .... Apply Twice A Day To Lesion Until Healed As Needed 14)  Metamucil 30.9 % Powd (Psyllium) .... Otc As Directed. 15)  Flonase 50 Mcg/act Susp (Fluticasone Propionate) .Marland Kitchen.. 1 Spray in Each Nostril Once Daily For 2 Weeks For Ear Problem  Stop It If You Get A Nosebleed 16)  Probiotic  Caps (Probiotic Product) .... Take 1 Capsule By Mouth Once A Day 17)  Ventolin Hfa 108 (90 Base) Mcg/act Aers (Albuterol Sulfate) .... Take 2 Puffs By Mouth Every 4 Hours As Needed For Shortness of Breath or Cough  Allergies: 1)  ! Augmentin 2)  * Ace Inhibitors Group 3)  Ibuprofen (Ibuprofen)  Past History:  Past Medical History: Last updated: 10/02/2009 GERD Hyperlipidemia Hypertension obesity MR hyperglycemia all rhinitis OA in R foot glaucoma chronic low back pain with radiculopathy (deg disk)  podiatry Dr Ralene Cork dentist Dr Ulysees Barns Dr Dione Booze derm-- Dr Terri Piedra ortho Ophelia Charter  Past Surgical History: Last updated: 03/23/2008 Hysterectomy arm fx- past cataract surgery   Social History: Last updated: 08/13/2007 lives alone- with help from neighbors currently- disability for arm fx non smoker no alcohol   Review of Systems       per HPI  Physical Exam  General:  overweight but  generally well appearing  Lungs:  Normal respiratory effort, chest expands symmetrically. Lungs are clear to auscultation, no crackles or wheezes. Heart:  Normal rate and regular rhythm. S1 and S2 normal without gallop, murmur, click, rub or other extra sounds. Abdomen:  Bowel sounds positive,abdomen soft and non-tender without masses, organomegaly or hernias noted.  no CVA tenderness, no suprapubic pressure/tenderness.  obese Pulses:  2+ rad pulses Extremities:  no edema Skin:  Intact without suspicious lesions or rashes   Impression & Recommendations:  Problem # 1:  UTI'S, HX OF (ICD-V13.00) pt without any sxs today.  however, UA looks concerning for infection.  ? asxs bacteruria,  sent culture in case deteriorates to have organism (although just completed 3d course of cipro).  reassured patient today, advised to notify us if change in sxs (see pt instructions).  Orders: UA Dipstick W/ Micro (manual) ([address]) T-Culture, Urine ([id number])  Complete Medication List: 1)  Desonide 0.05 % Crea (Desonide) 2)  Ketoconazole 2 % Crea (Ketoconazole) .... Apply 1 a small amount to affected area twice a day as needed 3)  Cartia Xt 240 Mg Cp24 (Diltiazem hcl coated beads) .Marland Kitchen.. 1 by mouth once daily 4)  Lipitor 20 Mg Tabs (Atorvastatin calcium) .... Take one by mouth daily 5)  Omeprazole 20 Mg Cpdr (Omeprazole) .Marland Kitchen.. 1 by mouth every am 6)  Hydrochlorothiazide 12.5 Mg Tabs (Hydrochlorothiazide) .... Take 1 tablet by mouth once a day 7)  Klor-con M20 20 Meq Tbcr (Potassium chloride crys cr) .... Take 1 tablet by mouth once a day 8)  Calcium 500/d 500-200 Mg-unit Tabs (Calcium carbonate-vitamin d) .... Once daily 9)  Clarinex 5 Mg Tabs (Desloratadine) .Marland Kitchen.. 1 by mouth once daily as needed allergies 10)  Aspirin 81 Mg Tabs (Aspirin) .Marland Kitchen.. 1 by mouth once daily with food 11)  Nystatin 100000 Unit/gm Crea (Nystatin) .... Apply to rash under right arm and under breasts (red area) twice daily until better 12)  Metformin Hcl 500 Mg Tabs (Metformin hcl) .... Take one by mouth two times a day 13)  Triple Abt Oint  .... Apply twice a day to lesion until healed as needed 14)  Metamucil 30.9 % Powd (Psyllium) .... Otc as directed. 15)  Flonase 50 Mcg/act Susp (Fluticasone propionate) .Marland Kitchen.. 1 spray in each nostril once daily for 2 weeks for ear problem  stop it if you get a nosebleed 16)  Probiotic Caps (Probiotic product) .... Take 1 capsule by mouth once a day 17)  Ventolin Hfa 108 (90 Base) Mcg/act Aers (Albuterol sulfate) .... Take 2 puffs by mouth every 4 hours as needed for shortness of breath or cough  Patient Instructions: 1)  I'm glad you're feeling better. 2)  Let us konw if you are not  feeling better.  call us if you start having burning when you pee or belly pain or back pain or fevers. 3)  Call clinic with questions.   Orders Added: 1)  Est. Patient Level III [91478] 2)  UA Dipstick W/ Micro (manual) [81000] 3)  T-Culture, Urine [[id number]]    Current Allergies (reviewed today): ! AUGMENTIN * ACE INHIBITORS GROUP IBUPROFEN (IBUPROFEN)  Laboratory Results   Urine Tests  Date/Time Received: November 16, 2009 2:45 PM  Date/Time Reported: November 16, 2009 2:46 PM   Routine Urinalysis   Color: yellow Appearance: Hazy Glucose: negative   (Normal Range: Negative) Bilirubin: negative   (Normal Range: Negative) Ketone: negative   (Normal Range: Negative) Spec. Gravity: >=1.030   (Normal Range: 1.003-1.035) Blood:  moderate   (Normal Range: Negative) pH: 6.0   (Normal Range: 5.0-8.0) Protein: 30   (Normal Range: Negative) Urobilinogen: 0.2   (Normal Range: 0-1) Nitrite: negative   (Normal Range: Negative) Leukocyte Esterace: moderate   (Normal Range: Negative)  Urine Microscopic WBC/HPF: 10-20 RBC/HPF: 0-3 Bacteria/HPF: 2+ Mucous/HPF: no Epithelial/HPF: 1-5 Crystals/HPF: 0 Casts/LPF: 0 Yeast/HPF: 0    Comments: read by ...............................Eustaquio Boyden  MD  November 16, 2009 2:56 PM  will send UCx if pt able to void.

## 2010-02-19 NOTE — Progress Notes (Signed)
Summary: metformin is to expensive   Phone Note Call from Patient Call back at Home Phone [phone]   Caller: Patient Call For: Judith Part MD Summary of Call: [facility] and they told her she would have to pay $70.00 now and she used to only pay $2.00. She is asking if she can switch to something less expensive. [facility].  Initial call taken by: Melody Comas,  October 23, 2009 10:00 AM  Follow-up for Phone Call        that does not sound right - metformin is usually cheap and generic  pt is mentally retarded - she may have communication issues with pharmacy -- could you please call them and see what the deal is   ? perhaps she is in the donut hole? Follow-up by: Judith Part MD,  October 23, 2009 12:05 PM  Additional Follow-up for Phone Call Additional follow up Details #1::        [facility] with [facility] at [facility] and the Metformin is still $2.00 but pt's Lipitor is $70.00. Pt usually pays $2.00 for that also but pt has met donut hole. Please advise. Lewanda Rife LPN  October 23, 2009 12:26 PM   please see if we have lipitor samples - either 20 or 40s that she could split --with explicit instructions - until she is out of the donut hole -thanks Additional Follow-up by: Judith Part MD,  October 23, 2009 1:16 PM    Additional Follow-up for Phone Call Additional follow up Details #2::    Patient notified as instructed by telephone. Lipitor 20mg  samples(#84) lot #[id number] exp date 11/2010 left at front desk. Pt understands to take Lipitor 20mg  take one daily by mouth as directed. Pt will [facility]k with [facility] in three months to see if can get Lipitor at $2.00 again if not will let Dr Milinda Antis know.Lewanda Rife LPN  October 23, 2009 2:22 PM

## 2010-02-19 NOTE — Assessment & Plan Note (Signed)
Summary: ROA FOR 2 DAY FOLLOW-UP/JRR   Vital Signs:  Patient profile:   55 year old female Height:      62 inches Weight:      207.25 pounds BMI:     38.04 Temp:     97.9 degrees F oral Pulse rate:   76 / minute Pulse rhythm:   regular Resp:     20 per minute BP sitting:   108 / 76  (left arm) Cuff size:   large  Vitals Entered By: Lewanda Rife LPN (October 12, 2009 10:14 AM) CC: 2 day f/u   History of Present Illness: is getting better !- less cough and no shortness of breath  no fever no trouble with any of her meds- taking complianty    no vomiting at all  not a lot of phlegm- is white   Allergies: 1)  ! Augmentin 2)  * Ace Inhibitors Group 3)  Ibuprofen (Ibuprofen)  Past History:  Past Medical History: Last updated: 10/02/2009 GERD Hyperlipidemia Hypertension obesity MR hyperglycemia all rhinitis OA in R foot glaucoma chronic low back pain with radiculopathy (deg disk)  podiatry Dr Ralene Cork dentist Dr Ulysees Barns Dr Dione Booze derm-- Dr Terri Piedra ortho Ophelia Charter  Past Surgical History: Last updated: 03/23/2008 Hysterectomy arm fx- past cataract surgery   Family History: Last updated: 02/25/2009 mother DM- deceased father - deceased  aunt DM- deceased , CVA, pacemaker   Social History: Last updated: 08/13/2007 lives alone- with help from neighbors currently- disability for arm fx non smoker no alcohol   Review of Systems General:  Denies fatigue, fever, loss of appetite, and malaise. Eyes:  Denies blurring and eye irritation. CV:  Denies chest pain or discomfort and palpitations. Resp:  Complains of cough; denies pleuritic, shortness of breath, sputum productive, and wheezing. Derm:  Denies itching and rash.  Physical Exam  General:  overweight but generally well appearing hygiene is fair  Head:  normocephalic, atraumatic, and no abnormalities observed.   Mouth:  pharynx pink and moist.   Lungs:  Normal respiratory effort, chest expands  symmetrically. Lungs are clear to auscultation, no crackles or wheezes. Heart:  Normal rate and regular rhythm. S1 and S2 normal without gallop, murmur, click, rub or other extra sounds. Skin:  Intact without suspicious lesions or rashes Psych:  normal affect, talkative and pleasant - baseline MR    Impression & Recommendations:  Problem # 1:  BRONCHITIS- ACUTE (ICD-466.0) Assessment Improved much improved with nl lung exam urged pt to finish meds and update me if symptoms worsen Her updated medication list for this problem includes:    Tessalon Perles 100 Mg Caps (Benzonatate) .Marland Kitchen... 1 by mouth up to three times a day as needed cough - swallow whole    Ventolin Hfa 108 (90 Base) Mcg/act Aers (Albuterol sulfate) .Marland Kitchen... Take 2 puffs by mouth every 4 hours as needed for shortness of breath or cough    Tussionex Pennkinetic Er 10-8 Mg/78ml Lqcr (Hydrocod polst-chlorphen polst) .Marland Kitchen... Take 1/2 teaspoon by mouth every 12 hours as needed cough    Zithromax Z-pak 250 Mg Tabs (Azithromycin) .Marland Kitchen... Take by mouth as directed  Complete Medication List: 1)  Desonide 0.05 % Crea (Desonide) 2)  Ketoconazole 2 % Crea (Ketoconazole) .... Apply 1 a small amount to affected area twice a day as needed 3)  Cartia Xt 240 Mg Cp24 (Diltiazem hcl coated beads) .Marland Kitchen.. 1 by mouth once daily 4)  Lipitor 20 Mg Tabs (Atorvastatin calcium) .... Take one by mouth  daily 5)  Omeprazole 20 Mg Cpdr (Omeprazole) .Marland Kitchen.. 1 by mouth every am 6)  Hydrochlorothiazide 12.5 Mg Tabs (Hydrochlorothiazide) .... Take 1 tablet by mouth once a day 7)  Klor-con M20 20 Meq Tbcr (Potassium chloride crys cr) .... Take 1 tablet by mouth once a day 8)  Calcium 500/d 500-200 Mg-unit Tabs (Calcium carbonate-vitamin d) .... Once daily 9)  Clarinex 5 Mg Tabs (Desloratadine) .Marland Kitchen.. 1 by mouth once daily as needed allergies 10)  Aspirin 81 Mg Tabs (Aspirin) .Marland Kitchen.. 1 by mouth once daily with food 11)  Nystatin 100000 Unit/gm Crea (Nystatin) .... Apply to rash  under right arm and under breasts (red area) twice daily until better 12)  Metformin Hcl 500 Mg Tabs (Metformin hcl) .... Take one by mouth two times a day 13)  Triple Abt Oint  .... Apply twice a day to lesion until healed as needed 14)  Tessalon Perles 100 Mg Caps (Benzonatate) .Marland Kitchen.. 1 by mouth up to three times a day as needed cough - swallow whole 15)  Pepto-bismol Max Strength 525 Mg/69ml Susp (Bismuth subsalicylate) .... Otc as directed. 16)  Metamucil 30.9 % Powd (Psyllium) .... Otc as directed. 17)  Flonase 50 Mcg/act Susp (Fluticasone propionate) .Marland Kitchen.. 1 spray in each nostril once daily for 2 weeks for ear problem  stop it if you get a nosebleed 18)  Nitrofurantoin Macrocrystal 100 Mg Caps (Nitrofurantoin macrocrystal) .... Take one capsule twice a day for 7 days 19)  Probiotic Caps (Probiotic product) .... Take 1 capsule by mouth once a day 20)  Loperamide Hcl 2 Mg Caps (Loperamide hcl) .... Take two caplets after 1st loose stool then 1 captlet after each subsequent loose stool, not to take more than 4 caplets in 24 hr period. 21)  Ventolin Hfa 108 (90 Base) Mcg/act Aers (Albuterol sulfate) .... Take 2 puffs by mouth every 4 hours as needed for shortness of breath or cough 22)  Prednisone 20 Mg Tabs (Prednisone) .... Take 2 by mouth every day for 5 days 23)  Tussionex Pennkinetic Er 10-8 Mg/66ml Lqcr (Hydrocod polst-chlorphen polst) .... Take 1/2 teaspoon by mouth every 12 hours as needed cough 24)  Zithromax Z-pak 250 Mg Tabs (Azithromycin) .... Take by mouth as directed  Patient Instructions: 1)  finish all your medicines 2)  glad you are doing better 3)  update me if cough or other symptoms worsen  Current Allergies (reviewed today): ! AUGMENTIN * ACE INHIBITORS GROUP IBUPROFEN (IBUPROFEN)

## 2010-02-19 NOTE — Progress Notes (Signed)
Summary: refill request for ketoconazole shampoo  Phone Note Refill Request Message from:  Fax from Pharmacy  Refills Requested: Medication #1:  KETOCONAZOLE 2 %  SHAM use as directed twice weekly prn   Last Refilled: 12/16/2008 [facility] [address]  Initial call taken by: Lowella Petties CMA,  January 22, 2009 9:08 AM    Prescriptions: KETOCONAZOLE 2 % CREA (KETOCONAZOLE) Apply 1 a small amount to affected area twice a day as needed  #30 Gram x 1   Entered and Authorized by:   Ruthe Mannan MD   Signed by:   Ruthe Mannan MD on 01/22/2009   Method used:   [facility]        [facility] [facility])       [address]       [place], [place]  [address]       Ph: [phone] or [phone]       Fax: [phone]   RxID:   [id number]

## 2010-02-19 NOTE — Assessment & Plan Note (Signed)
Summary: ROA 6 MTHS CYD   Vital Signs:  Patient profile:   55 year old female Height:      62 inches Weight:      216.25 pounds BMI:     39.70 Temp:     97.4 degrees F oral Pulse rate:   88 / minute Pulse rhythm:   regular BP sitting:   114 / 78  (left arm) Cuff size:   large  Vitals Entered By: Lewanda Rife LPN (August 06, 2009 8:40 AM) CC: six month f/u   History of Present Illness: here for f/u of HTN and also DM  is on metformin wt is stable   needs refil on her K  no cramping or leg pain  wt is unchanged  is getting some exercise -- does it in the house when it is too hot outside -- like laundry does not have any exercise videos likes to walk when it is nice out  last AIC 7.0-- now on metformin up to date opthy no side effects from metformin diet -- is drinking water / no sugar drinks/ few diet drinks no sweets at all cut back on bread some fruits - but not too much    Allergies: 1)  ! Augmentin 2)  * Ace Inhibitors Group 3)  Ibuprofen (Ibuprofen)  Past History:  Past Medical History: Last updated: 03/23/2008 GERD Hyperlipidemia Hypertension obesity MR hyperglycemia all rhinitis OA in R foot glaucoma   podiatry Dr Ralene Cork dentist Dr Ulysees Barns Dr Dione Booze derm-- Dr Terri Piedra  Past Surgical History: Last updated: 03/23/2008 Hysterectomy arm fx- past cataract surgery   Family History: Last updated: 02-27-09 mother DM- deceased father - deceased  aunt DM- deceased , CVA, pacemaker   Social History: Last updated: 08/13/2007 lives alone- with help from neighbors currently- disability for arm fx non smoker no alcohol   Review of Systems General:  Denies fatigue, fever, loss of appetite, malaise, and weight loss. Eyes:  Denies blurring. CV:  Denies chest pain or discomfort, palpitations, shortness of breath with exertion, and swelling of feet. Resp:  Denies cough, shortness of breath, and wheezing. GI:  Denies abdominal pain, change in  bowel habits, and indigestion. GU:  Denies urinary frequency. MS:  Denies muscle aches. Derm:  Denies poor wound healing and rash. Neuro:  Denies numbness and tingling. Endo:  Denies excessive hunger, excessive thirst, and excessive urination. Heme:  Denies abnormal bruising and bleeding.  Physical Exam  General:  overweight but generally well appearing hygiene is fair  Head:  normocephalic, atraumatic, and no abnormalities observed.   Eyes:  vision grossly intact, pupils equal, pupils round, pupils reactive to light, and no injection.   Mouth:  pharynx pink and moist.   Neck:  supple with full rom and no masses or thyromegally, no JVD or carotid bruit  Lungs:  Normal respiratory effort, chest expands symmetrically. Lungs are clear to auscultation, no crackles or wheezes. Heart:  Normal rate and regular rhythm. S1 and S2 normal without gallop, murmur, click, rub or other extra sounds. Abdomen:  Bowel sounds positive,abdomen soft and non-tender without masses, organomegaly or hernias noted. no renal bruits  Msk:  No deformity or scoliosis noted of thoracic or lumbar spine.   Neurologic:  sensation intact to light touch, gait normal, and DTRs symmetrical and normal.   Skin:  Intact without suspicious lesions or rashes Cervical Nodes:  No lymphadenopathy noted Psych:  normal affect, talkative and pleasant - baseline MR   Diabetes Management Exam:  Foot Exam (with socks and/or shoes not present):       Sensory-Pinprick/Light touch:          Left medial foot (L-4): normal          Left dorsal foot (L-5): normal          Left lateral foot (S-1): normal          Right medial foot (L-4): normal          Right dorsal foot (L-5): normal          Right lateral foot (S-1): normal       Sensory-Monofilament:          Left foot: normal          Right foot: normal       Inspection:          Left foot: normal          Right foot: normal       Nails:          Left foot: normal           Right foot: normal   Impression & Recommendations:  Problem # 1:  DIABETES MELLITUS, TYPE II (ICD-250.00) Assessment Unchanged  with metformin two times a day  hope for imp in AIC  rev diet needs exercise daily- recommended some videos for home - indoors or out  stressed imp of wt loss  ? if intellectual fxn is high enough to comprehend this lab today f/u 6 mo  Her updated medication list for this problem includes:    Aspirin 81 Mg Tabs (Aspirin) .Marland Kitchen... 1 by mouth once daily with food    Metformin Hcl 500 Mg Tabs (Metformin hcl) .Marland Kitchen... Take one by mouth two times a day  Orders: Venipuncture (96045) TLB-Renal Function Panel (80069-RENAL) TLB-A1C / Hgb A1C (Glycohemoglobin) (83036-A1C) Prescription Created Electronically [phone])  Labs Reviewed: Creat: 0.6 (05/28/2009)     Last Eye Exam: normal (11/14/2008) Reviewed HgBA1c results: 7.0 (05/15/2009)  7.1 (02/08/2009)  Problem # 2:  HYPERTENSION (ICD-401.9) Assessment: Unchanged  well controlled  no change in med refilled K supplement today check renal panel  lifestyle disc  Her updated medication list for this problem includes:    Cartia Xt 240 Mg Cp24 (Diltiazem hcl coated beads) .Marland Kitchen... 1 by mouth once daily    Hydrochlorothiazide 12.5 Mg Tabs (Hydrochlorothiazide) .Marland Kitchen... Take 1 tablet by mouth once a day  Orders: Venipuncture ([address]147) TLB-Renal Function Panel (80069-RENAL) TLB-A1C / Hgb A1C (Glycohemoglobin) (83036-A1C) Prescription Created Electronically [id number])  Complete Medication List: 1)  Desonide 0.05 % Crea (Desonide) 2)  Ketoconazole 2 % Crea (Ketoconazole) .... Apply 1 a small amount to affected area twice a day as needed 3)  Cartia Xt 240 Mg Cp24 (Diltiazem hcl coated beads) .Marland Kitchen.. 1 by mouth once daily 4)  Lipitor 20 Mg Tabs (Atorvastatin calcium) .... Take one by mouth daily 5)  Omeprazole 20 Mg Cpdr (Omeprazole) .Marland Kitchen.. 1 by mouth every am 6)  Hydrochlorothiazide 12.5 Mg Tabs (Hydrochlorothiazide) .... Take 1  tablet by mouth once a day 7)  Klor-con M20 20 Meq Tbcr (Potassium chloride crys cr) .... Take 1 tablet by mouth once a day 8)  Calcium 500/d 500-200 Mg-unit Tabs (Calcium carbonate-vitamin d) .... Once daily 9)  Clarinex 5 Mg Tabs (Desloratadine) .Marland Kitchen.. 1 by mouth once daily as needed allergies 10)  Aspirin 81 Mg Tabs (Aspirin) .Marland Kitchen.. 1 by mouth once daily with food 11)  Nystatin 100000 Unit/gm Crea (Nystatin) .Marland KitchenMarland KitchenMarland Kitchen  Apply to rash under right arm and under breasts (red area) twice daily until better 12)  Metformin Hcl 500 Mg Tabs (Metformin hcl) .... Take one by mouth two times a day 13)  Triple Abt Oint  .... Apply twice a day to lesion until healed as needed 14)  Tessalon Perles 100 Mg Caps (Benzonatate) .Marland Kitchen.. 1 by mouth up to three times a day as needed cough - swallow whole 15)  Pepto-bismol Max Strength 525 Mg/72ml Susp (Bismuth subsalicylate) .... Otc as directed. 16)  Metamucil 30.9 % Powd (Psyllium) .... Otc as directed. 17)  Flonase 50 Mcg/act Susp (Fluticasone propionate) .Marland Kitchen.. 1 spray in each nostril once daily for 2 weeks for ear problem  stop it if you get a nosebleed 18)  Nitrofurantoin Macrocrystal 100 Mg Caps (Nitrofurantoin macrocrystal) .... Take one capsule twice a day for 7 days 19)  Probiotic Caps (Probiotic product) .... Take 1 capsule by mouth once a day 20)  Loperamide Hcl 2 Mg Caps (Loperamide hcl) .... Take two caplets after 1st loose stool then 1 captlet after each subsequent loose stool, not to take more than 4 caplets in 24 hr period.  Patient Instructions: 1)  no medicine changes  2)  get exercise video - perhaps a walking one - and do that on days you cannot get outside because of the heat  3)  goal is 30 minute of exercise daily  4)  work on weight loss 5)  labs today  6)  follow up with me in about 6 months  Prescriptions: KLOR-CON M20 20 MEQ  TBCR (POTASSIUM CHLORIDE CRYS CR) Take 1 tablet by mouth once a day  #30 x 11   Entered and Authorized by:   Judith Part  MD   Signed by:   Judith Part MD on 08/06/2009   Method used:   [facility]        [facility] [facility]       [address]       [place], [place]  [address]       Ph: [phone] or [phone]       Fax: [phone]   RxID:   [id number]   Current Allergies (reviewed today): ! AUGMENTIN * ACE INHIBITORS GROUP IBUPROFEN (IBUPROFEN)

## 2010-02-19 NOTE — Assessment & Plan Note (Signed)
Summary: BACK PAIN/RBH   Vital Signs:  Patient profile:   55 year old female Height:      62 inches Weight:      195 pounds BMI:     35.79 Temp:     97.4 degrees F oral Pulse rate:   88 / minute Pulse rhythm:   regular BP sitting:   126 / 68  (left arm) Cuff size:   regular  Vitals Entered By: Lewanda Rife LPN (December 07, 2009 10:11 AM) CC: low back pain and burn after urinates   History of Present Illness: low back has been more sore than usual  usual  also some burning on urination and frequency  that started last evening   this am some suprapubic pain and pressure - and burning   last uti was end of oct   her diarrhea is better  eating bland diet and chicken noodle soup /apple sauce and bananas and yogurt   Allergies: 1)  ! Augmentin 2)  * Ace Inhibitors Group 3)  Ibuprofen (Ibuprofen)  Past History:  Past Medical History: Last updated: 10/02/2009 GERD Hyperlipidemia Hypertension obesity MR hyperglycemia all rhinitis OA in R foot glaucoma chronic low back pain with radiculopathy (deg disk)  podiatry Dr Ralene Cork dentist Dr Ulysees Barns Dr Dione Booze derm-- Dr Terri Piedra ortho [facility]  Past Surgical History: Last updated: 03/23/2008 Hysterectomy arm fx- past cataract surgery   Family History: Last updated: 03-04-09 mother DM- deceased father - deceased  aunt DM- deceased , CVA, pacemaker   Social History: Last updated: 08/13/2007 lives alone- with help from neighbors currently- disability for arm fx non smoker no alcohol   Review of Systems General:  Denies fatigue, fever, loss of appetite, and malaise. Eyes:  Denies blurring. CV:  Denies chest pain or discomfort and lightheadness. Resp:  Denies cough and wheezing. GI:  Denies abdominal pain, bloody stools, diarrhea, indigestion, loss of appetite, nausea, and vomiting. GU:  Complains of dysuria; denies hematuria. Derm:  Denies itching.  Physical Exam  General:  overweight but generally  well appearing  Head:  normocephalic, atraumatic, and no abnormalities observed.   Eyes:  vision grossly intact, pupils equal, pupils round, and pupils reactive to light.  no conjunctival pallor, injection or icterus  Mouth:  throat clear Neck:  supple with full rom and no masses or thyromegally, no JVD or carotid bruit  Lungs:  Normal respiratory effort, chest expands symmetrically. Lungs are clear to auscultation, no crackles or wheezes. Heart:  Normal rate and regular rhythm. S1 and S2 normal without gallop, murmur, click, rub or other extra sounds. Abdomen:  no suprapubic tenderness or fullness felt  soft and no distention.   Msk:  no CVA tenderness  Extremities:  No clubbing, cyanosis, edema, or deformity noted with normal full range of motion of all joints.   Skin:  Intact without suspicious lesions or rashes Cervical Nodes:  No lymphadenopathy noted Psych:  baseline MR - repeats herself frequently   Impression & Recommendations:  Problem # 1:  UTI (ICD-599.0) Assessment New with some painful voiding /frequency and low back pain  last uti 1 mo ago --with inconclusive cx tx with sulfa recommend sympt care- see pt instructions   will send for cx and update Her updated medication list for this problem includes:    Bactrim Ds 800-160 Mg Tabs (Sulfamethoxazole-trimethoprim) .Marland Kitchen... 1 by mouth two times a Mekiah Wahler for 5 days for urinary infection  Orders: T-Culture, Urine ([id number]) Specimen Handling ([id number]) Prescription Created Electronically [id number])  UA Dipstick W/ Micro (manual) (16109)  Problem # 2:  DIARRHEA (ICD-787.91) Assessment: Improved this is totally better with bland diet  can return to nl diet leaving out fatty/ fried and fast foods (which she should do anyway) update if symptoms return Her updated medication list for this problem includes:    Probiotic Caps (Probiotic product) .Marland Kitchen... Take 1 capsule by mouth once a Shaney Deckman    Pepto-bismol Max Strength 525 Mg/50ml Susp  (Bismuth subsalicylate) ..... Otc as directed.  Complete Medication List: 1)  Desonide 0.05 % Crea (Desonide) 2)  Ketoconazole 2 % Crea (Ketoconazole) .... Apply 1 a small amount to affected area twice a Nerissa Constantin as needed 3)  Cartia Xt 240 Mg Cp24 (Diltiazem hcl coated beads) .Marland Kitchen.. 1 by mouth once daily 4)  Lipitor 20 Mg Tabs (Atorvastatin calcium) .... Take one by mouth daily 5)  Omeprazole 20 Mg Cpdr (Omeprazole) .Marland Kitchen.. 1 by mouth every am 6)  Hydrochlorothiazide 12.5 Mg Tabs (Hydrochlorothiazide) .... Take 1 tablet by mouth once a Sybrina Laning 7)  Klor-con M20 20 Meq Tbcr (Potassium chloride crys cr) .... Take 1 tablet by mouth once a Nthony Lefferts 8)  Calcium 500/d 500-200 Mg-unit Tabs (Calcium carbonate-vitamin d) .... Once daily 9)  Clarinex 5 Mg Tabs (Desloratadine) .Marland Kitchen.. 1 by mouth once daily as needed allergies 10)  Aspirin 81 Mg Tabs (Aspirin) .Marland Kitchen.. 1 by mouth once daily with food 11)  Nystatin 100000 Unit/gm Crea (Nystatin) .... Apply to rash under right arm and under breasts (red area) twice daily until better as needed 12)  Metformin Hcl 500 Mg Tabs (Metformin hcl) .... Take one by mouth two times a Adisyn Ruscitti 13)  Triple Abt Oint  .... Apply twice a Arie Gable to lesion until healed as needed 14)  Metamucil 30.9 % Powd (Psyllium) .... Otc as directed. 15)  Flonase 50 Mcg/act Susp (Fluticasone propionate) .Marland Kitchen.. 1 spray in each nostril once daily for 2 weeks for ear problem  stop it if you get a nosebleed as needed 16)  Probiotic Caps (Probiotic product) .... Take 1 capsule by mouth once a Naina Sleeper 17)  Ventolin Hfa 108 (90 Base) Mcg/act Aers (Albuterol sulfate) .... Take 2 puffs by mouth every 4 hours as needed for shortness of breath or cough 18)  Pepto-bismol Max Strength 525 Mg/40ml Susp (Bismuth subsalicylate) .... Otc as directed. 19)  Bactrim Ds 800-160 Mg Tabs (Sulfamethoxazole-trimethoprim) .Marland Kitchen.. 1 by mouth two times a Taleigh Gero for 5 days for urinary infection  Patient Instructions: 1)  you can go back to regular diet - stay  away from fast food and fatty foods (nothing fried)  2)  if diarrhea returns call and update me  3)  continue drinking lots of water 4)  call or seek care is symptoms don't improve in 2-3 days or if you develop back pain, nausea, or vomiting 5)  take the bactrim DS for urinary infection  6)  I will update you when urine culture returns  Prescriptions: BACTRIM DS 800-160 MG TABS (SULFAMETHOXAZOLE-TRIMETHOPRIM) 1 by mouth two times a Jendayi Berling for 5 days for urinary infection  #10 x 0   Entered and Authorized by:   Judith Part MD   Signed by:   Judith Part MD on 12/07/2009   Method used:   Electronically to        [facility] [id number] (retail)       [address]       [place], [place]  [address]  Ph: [phone] or [phone]       Fax: [phone]   RxID:   [id number]    Orders Added: 1)  T-Culture, Urine [[id number]] 2)  Specimen Handling [99000] 3)  Prescription Created Electronically [[id number]] 4)  Est. Patient Level III [66440] 5)  UA Dipstick W/ Micro (manual) [81000]    Current Allergies (reviewed today): ! AUGMENTIN * ACE INHIBITORS GROUP IBUPROFEN (IBUPROFEN)  Laboratory Results   Urine Tests  Date/Time Received: December 07, 2009 10:12 AM  Date/Time Reported: December 07, 2009 10:12 AM   Routine Urinalysis   Color: yellow Appearance: Hazy Glucose: negative   (Normal Range: Negative) Bilirubin: negative   (Normal Range: Negative) Ketone: negative   (Normal Range: Negative) Spec. Gravity: 1.015   (Normal Range: 1.003-1.035) Blood: trace-intact   (Normal Range: Negative) pH: 7.0   (Normal Range: 5.0-8.0) Protein: trace   (Normal Range: Negative) Urobilinogen: 0.2   (Normal Range: 0-1) Nitrite: negative   (Normal Range: Negative) Leukocyte Esterace: moderate   (Normal Range: Negative)  Urine Microscopic WBC/HPF: 3-8 RBC/HPF: 0-1 Bacteria/HPF: mod Mucous/HPF: few Epithelial/HPF: 0-2 Crystals/HPF: few Casts/LPF: 0 Yeast/HPF:  0 Other: 0

## 2010-02-21 NOTE — Progress Notes (Signed)
Summary: samples of lipitor  Phone Note Call from Patient   Caller: Patient Call For: Judith Part MD Summary of Call: Patient called asking for samples of lipitor. 2 month supply of 20 mg lipitor expiration is 11/12, lot # [id number] given to patient.  Initial call taken by: Melody Comas,  January 23, 2010 4:20 PM  Follow-up for Phone Call        thank you for doing that  Follow-up by: Judith Part MD,  January 23, 2010 4:34 PM

## 2010-02-21 NOTE — Assessment & Plan Note (Signed)
Summary: COUGH,DRAINAGE/CLE   Vital Signs:  Patient profile:   55 year old female Weight:      194.25 pounds Temp:     97.7 degrees F oral Pulse rate:   78 / minute Pulse rhythm:   regular BP sitting:   118 / 78  (left arm) Cuff size:   large  Vitals Entered By: Selena Batten Dance CMA Duncan Dull) (February 01, 2010 8:44 AM) CC: Cough and drainage (started this AM)   History of Present Illness: CC: cough   1d h/o cough productive of mild drainage.  + phlegm.  sneezed, cleaned nose and noticed bit of blood on toilet paper (thinks from R nostril) so decided to come in to get checked out.  No epistaxis, just small amt blood on toilet paper.  No f/c/n/v/SOB, chest pain.  No HA.    recently using flonase spray but may have caused nosebleeds - so only using if necessary.  hasn't used in a while.  No smokers at home, no h/o asthma, + h/o allergies.  Allergies: 1)  ! Augmentin 2)  * Ace Inhibitors Group 3)  Ibuprofen (Ibuprofen)  Past History:  Past Medical History: Last updated: 10/02/2009 GERD Hyperlipidemia Hypertension obesity MR hyperglycemia all rhinitis OA in R foot glaucoma chronic low back pain with radiculopathy (deg disk)  podiatry Dr Ralene Cork dentist Dr Ulysees Barns Dr Dione Booze derm-- Dr Terri Piedra ortho - [facility]  Social History: Last updated: 08/13/2007 lives alone- with help from neighbors currently- disability for arm fx non smoker no alcohol   Review of Systems       per HPI  Physical Exam  General:  overweight but generally well appearing  Head:  normocephalic, atraumatic, and no abnormalities observed.   Eyes:  vision grossly intact, pupils equal, pupils round, and pupils reactive to light.  no conjunctival pallor, injection or icterus  Ears:  R ear normal and L ear normal.   Nose:  no nasal discharge.  no bleeding evident in nose Mouth:  pharynx pink and moist.   Neck:  supple with full rom and no masses or thyromegally, no JVD or carotid bruit  Lungs:   Normal respiratory effort, chest expands symmetrically. Lungs are clear to auscultation, no crackles or wheezes. Heart:  Normal rate and regular rhythm. S1 and S2 normal without gallop, murmur, click, rub or other extra sounds. Pulses:  2+ rad pulses   Impression & Recommendations:  Problem # 1:  EPISTAXIS (ICD-784.7) minimal, resolved.  reassured.  nasal saline.  return as needed.  Complete Medication List: 1)  Desonide 0.05 % Crea (Desonide) 2)  Cartia Xt 240 Mg Cp24 (Diltiazem hcl coated beads) .Marland Kitchen.. 1 by mouth once daily 3)  Lipitor 20 Mg Tabs (Atorvastatin calcium) .... Take one by mouth daily 4)  Omeprazole 20 Mg Cpdr (Omeprazole) .Marland Kitchen.. 1 by mouth every am 5)  Hydrochlorothiazide 12.5 Mg Tabs (Hydrochlorothiazide) .... Take 1 tablet by mouth once a day 6)  Klor-con M20 20 Meq Tbcr (Potassium chloride crys cr) .... Take 1 tablet by mouth once a day 7)  Calcium 500/d 500-200 Mg-unit Tabs (Calcium carbonate-vitamin d) .... Once daily 8)  Clarinex 5 Mg Tabs (Desloratadine) .Marland Kitchen.. 1 by mouth once daily as needed allergies 9)  Aspirin 81 Mg Tabs (Aspirin) .Marland Kitchen.. 1 by mouth once daily with food 10)  Metformin Hcl 500 Mg Tabs (Metformin hcl) .... Take one by mouth two times a day 11)  Metamucil 30.9 % Powd (Psyllium) .... Otc as directed. 12)  Flonase 50 Mcg/act  Susp (Fluticasone propionate) .Marland Kitchen.. 1 spray in each nostril once daily for 2 weeks for ear problem  stop it if you get a nosebleed as needed 13)  Probiotic Caps (Probiotic product) .... Take 1 capsule by mouth once a day 14)  Ventolin Hfa 108 (90 Base) Mcg/act Aers (Albuterol sulfate) .... Take 2 puffs by mouth every 4 hours as needed for shortness of breath or cough 15)  Pepto-bismol Max Strength 525 Mg/16ml Susp (Bismuth subsalicylate) .... Otc as directed. 16)  Ketoconazole 2 % Sham (Ketoconazole) .... Use as directed twice weekly as needed  Patient Instructions: 1)  use flonase only if needed - like you are doing 2)  use nasal saline  spray over the counter for nose  3)  this may be beginning of common cold.  get plenty of fluid, orange juice and water, and plenty of rest. 4)  call clinic with questions.   Orders Added: 1)  Est. Patient Level II [16109]    Current Allergies (reviewed today): ! AUGMENTIN * ACE INHIBITORS GROUP IBUPROFEN (IBUPROFEN)

## 2010-02-21 NOTE — Letter (Signed)
Summary: [facility]   Imported By: Lanelle Bal 01/01/2010 13:55:09  _____________________________________________________________________  External Attachment:    Type:   Image     Comment:   External Document

## 2010-02-21 NOTE — Assessment & Plan Note (Signed)
Summary: lower back pain/dlo   Vital Signs:  Patient profile:   55 year old female Weight:      196.75 pounds Temp:     97.4 degrees F oral Pulse rate:   88 / minute Pulse rhythm:   regular BP sitting:   122 / 82  (left arm) Cuff size:   large  Vitals Entered By: Selena Batten Dance CMA (AAMA) (January 01, 2010 8:02 AM) CC: LBP   History of Present Illness: CC: LBP  Starts in middle of lower back, started noticing 2-3 days ago.  worse with bending forward.  thinks may be a pulled muscle, feels like it's getting better.  thought she'd come and see doctor just to make sure.  No dysuria, no frequency or urgency.  + bladder accident over weekend x 1, but says this sometimes happens.  No bowel accidents, no fevers, no numbness in saddle distribution.  Denies injury/trauma or inciting event.  h/o chronic LBP.  Has been on loperamide for diarrhea.  "bowels doing allright".  No constipation.  Current Medications (verified): 1)  Desonide 0.05 % Crea (Desonide) 2)  Cartia Xt 240 Mg  Cp24 (Diltiazem Hcl Coated Beads) .Marland Kitchen.. 1 By Mouth Once Daily 3)  Lipitor 20 Mg Tabs (Atorvastatin Calcium) .... Take One By Mouth Daily 4)  Omeprazole 20 Mg  Cpdr (Omeprazole) .Marland Kitchen.. 1 By Mouth Every Am 5)  Hydrochlorothiazide 12.5 Mg  Tabs (Hydrochlorothiazide) .... Take 1 Tablet By Mouth Once A Day 6)  Klor-Con M20 20 Meq  Tbcr (Potassium Chloride Crys Cr) .... Take 1 Tablet By Mouth Once A Day 7)  Calcium 500/d 500-200 Mg-Unit  Tabs (Calcium Carbonate-Vitamin D) .... Once Daily 8)  Clarinex 5 Mg  Tabs (Desloratadine) .Marland Kitchen.. 1 By Mouth Once Daily As Needed Allergies 9)  Aspirin 81 Mg Tabs (Aspirin) .Marland Kitchen.. 1 By Mouth Once Daily With Food 10)  Metformin Hcl 500 Mg Tabs (Metformin Hcl) .... Take One By Mouth Two Times A Day 11)  Metamucil 30.9 % Powd (Psyllium) .... Otc As Directed. 12)  Flonase 50 Mcg/act Susp (Fluticasone Propionate) .Marland Kitchen.. 1 Spray in Each Nostril Once Daily For 2 Weeks For Ear Problem  Stop It If You Get A  Nosebleed As Needed 13)  Probiotic  Caps (Probiotic Product) .... Take 1 Capsule By Mouth Once A Day 14)  Ventolin Hfa 108 (90 Base) Mcg/act Aers (Albuterol Sulfate) .... Take 2 Puffs By Mouth Every 4 Hours As Needed For Shortness of Breath or Cough 15)  Pepto-Bismol Max Strength 525 Mg/47ml Susp (Bismuth Subsalicylate) .... Otc As Directed. 16)  Ketoconazole 2 % Sham (Ketoconazole) .... Use As Directed Twice Weekly As Needed  Allergies: 1)  ! Augmentin 2)  * Ace Inhibitors Group 3)  Ibuprofen (Ibuprofen)  Past History:  Past Medical History: Last updated: 10/02/2009 GERD Hyperlipidemia Hypertension obesity MR hyperglycemia all rhinitis OA in R foot glaucoma chronic low back pain with radiculopathy (deg disk)  podiatry Dr Ralene Cork dentist Dr Ulysees Barns Dr Dione Booze derm-- Dr Terri Piedra ortho - [facility]  Social History: Last updated: 08/13/2007 lives alone- with help from neighbors currently- disability for arm fx non smoker no alcohol   Review of Systems       per HPI  Physical Exam  General:  overweight but generally well appearing  Msk:  no CVA tenderness  + mild midline tenderness lumbar region.  No paraspinous mm tenderness.  FROM at spine without pain. neg SLR test, no pain at SI or GTB bilaterally, negative FABER, no  pain with int/ext rotation at hips. Pulses:  2+ rad pulses Extremities:  No clubbing, cyanosis, edema, or deformity noted with normal full range of motion of all joints.   Neurologic:  sensation intact BLE   Impression & Recommendations:  Problem # 1:  LUMBOSACRAL STRAIN, ACUTE (ICD-846.0) supportive care.  return if not better.  see pt instructions.  handout on low back pain provided with stretching exercises.  Complete Medication List: 1)  Desonide 0.05 % Crea (Desonide) 2)  Cartia Xt 240 Mg Cp24 (Diltiazem hcl coated beads) .Marland Kitchen.. 1 by mouth once daily 3)  Lipitor 20 Mg Tabs (Atorvastatin calcium) .... Take one by mouth daily 4)  Omeprazole 20  Mg Cpdr (Omeprazole) .Marland Kitchen.. 1 by mouth every am 5)  Hydrochlorothiazide 12.5 Mg Tabs (Hydrochlorothiazide) .... Take 1 tablet by mouth once a day 6)  Klor-con M20 20 Meq Tbcr (Potassium chloride crys cr) .... Take 1 tablet by mouth once a day 7)  Calcium 500/d 500-200 Mg-unit Tabs (Calcium carbonate-vitamin d) .... Once daily 8)  Clarinex 5 Mg Tabs (Desloratadine) .Marland Kitchen.. 1 by mouth once daily as needed allergies 9)  Aspirin 81 Mg Tabs (Aspirin) .Marland Kitchen.. 1 by mouth once daily with food 10)  Metformin Hcl 500 Mg Tabs (Metformin hcl) .... Take one by mouth two times a day 11)  Metamucil 30.9 % Powd (Psyllium) .... Otc as directed. 12)  Flonase 50 Mcg/act Susp (Fluticasone propionate) .Marland Kitchen.. 1 spray in each nostril once daily for 2 weeks for ear problem  stop it if you get a nosebleed as needed 13)  Probiotic Caps (Probiotic product) .... Take 1 capsule by mouth once a day 14)  Ventolin Hfa 108 (90 Base) Mcg/act Aers (Albuterol sulfate) .... Take 2 puffs by mouth every 4 hours as needed for shortness of breath or cough 15)  Pepto-bismol Max Strength 525 Mg/75ml Susp (Bismuth subsalicylate) .... Otc as directed. 16)  Ketoconazole 2 % Sham (Ketoconazole) .... Use as directed twice weekly as needed  Patient Instructions: 1)  I think you have a lumbar strain. 2)  Treat with ice to back, no more than 15 min at a time and three times a day. 3)  Stretching exercises to do. 4)  Take tylenol 500mg  for the back up to three times a day, but just as needed. 5)  Keep walking because it will help with the back. 6)  Let us know if not better.   Orders Added: 1)  Est. Patient Level III [16109]    Current Allergies (reviewed today): ! AUGMENTIN * ACE INHIBITORS GROUP IBUPROFEN (IBUPROFEN)

## 2010-02-21 NOTE — Assessment & Plan Note (Signed)
Summary: FEVER (?) / LFW   Vital Signs:  Patient profile:   55 year old female Height:      62 inches Weight:      197.75 pounds BMI:     36.30 Temp:     97.4 degrees F oral Pulse rate:   88 / minute Pulse rhythm:   regular BP sitting:   116 / 76  (left arm) Cuff size:   large  Vitals Entered By: Lewanda Rife LPN (January 18, 2010 12:10 PM) CC: fever   History of Present Illness: feels like she is running a fever  nl temp today here  has not checked her temp at home  just feels warm from the neck up  is feeling ok -- but her face feels hot to the touch when she touches it  no pain or heat anywhere else  no sweats or chills    no uri or stomach or urinary symptoms   had hyst in the past for a b9 tumor  never had menopause symptoms   has not been nervous or irritable or tired   is on 15 day course of probiotic for bowels- working well     Allergies: 1)  ! Augmentin 2)  * Ace Inhibitors Group 3)  Ibuprofen (Ibuprofen)  Past History:  Past Medical History: Last updated: 10/02/2009 GERD Hyperlipidemia Hypertension obesity MR hyperglycemia all rhinitis OA in R foot glaucoma chronic low back pain with radiculopathy (deg disk)  podiatry Dr Ralene Cork dentist Dr Ulysees Barns Dr Dione Booze derm-- Dr Terri Piedra ortho Ophelia Charter  Past Surgical History: Last updated: 03/23/2008 Hysterectomy arm fx- past cataract surgery   Family History: Last updated: 2009-02-19 mother DM- deceased father - deceased  aunt DM- deceased , CVA, pacemaker   Social History: Last updated: 08/13/2007 lives alone- with help from neighbors currently- disability for arm fx non smoker no alcohol   Review of Systems General:  Complains of sleep disorder and sweats; denies chills, fatigue, loss of appetite, and malaise. Eyes:  Denies blurring and eye irritation. CV:  Denies chest pain or discomfort, palpitations, and shortness of breath with exertion. Resp:  Denies cough and shortness of  breath. GI:  Denies abdominal pain, change in bowel habits, and nausea. GU:  Denies dysuria, hematuria, and urinary frequency. MS:  Denies joint pain, muscle aches, and cramps. Derm:  Denies itching, lesion(s), poor wound healing, and rash. Neuro:  Denies headaches, numbness, and tingling. Psych:  Denies irritability. Endo:  Denies cold intolerance, excessive hunger, excessive thirst, excessive urination, and heat intolerance. Heme:  Denies abnormal bruising and bleeding.  Physical Exam  General:  overweight but generally well appearing  Head:  normocephalic, atraumatic, and no abnormalities observed.   Eyes:  vision grossly intact, pupils equal, pupils round, and pupils reactive to light.  no conjunctival pallor, injection or icterus  Ears:  R ear normal and L ear normal.   Nose:  no nasal discharge.   Mouth:  pharynx pink and moist.   Neck:  supple with full rom and no masses or thyromegally, no JVD or carotid bruit  Lungs:  Normal respiratory effort, chest expands symmetrically. Lungs are clear to auscultation, no crackles or wheezes. Heart:  Normal rate and regular rhythm. S1 and S2 normal without gallop, murmur, click, rub or other extra sounds. Abdomen:  Bowel sounds positive,abdomen soft and non-tender without masses, organomegaly or hernias noted. Msk:  No deformity or scoliosis noted of thoracic or lumbar spine.   Extremities:  No clubbing,  cyanosis, edema, or deformity noted with normal full range of motion of all joints.   Neurologic:  sensation intact to light touch and DTRs symmetrical and normal.   Skin:  no skin color change no temp changes in face or elsewhere (pt feels fine now) Cervical Nodes:  No lymphadenopathy noted Inguinal Nodes:  No significant adenopathy Psych:  baseline MR - repeats herself frequently   Impression & Recommendations:  Problem # 1:  FACIAL FLUSHING (ICD-782.62) Assessment New per pt without color change or sweat- just hot feeling  no fever  - rev fever symptoms in detail also no other symptoms  ? could be some vasomotor symptoms of menopause (? what kind of hyst she had in past) given handout on hot flashes -will obs and invited pt to take temp at home and update if she does run a fever or develop other symptoms  Complete Medication List: 1)  Desonide 0.05 % Crea (Desonide) 2)  Cartia Xt 240 Mg Cp24 (Diltiazem hcl coated beads) .Marland Kitchen.. 1 by mouth once daily 3)  Lipitor 20 Mg Tabs (Atorvastatin calcium) .... Take one by mouth daily 4)  Omeprazole 20 Mg Cpdr (Omeprazole) .Marland Kitchen.. 1 by mouth every am 5)  Hydrochlorothiazide 12.5 Mg Tabs (Hydrochlorothiazide) .... Take 1 tablet by mouth once a day 6)  Klor-con M20 20 Meq Tbcr (Potassium chloride crys cr) .... Take 1 tablet by mouth once a day 7)  Calcium 500/d 500-200 Mg-unit Tabs (Calcium carbonate-vitamin d) .... Once daily 8)  Clarinex 5 Mg Tabs (Desloratadine) .Marland Kitchen.. 1 by mouth once daily as needed allergies 9)  Aspirin 81 Mg Tabs (Aspirin) .Marland Kitchen.. 1 by mouth once daily with food 10)  Metformin Hcl 500 Mg Tabs (Metformin hcl) .... Take one by mouth two times a day 11)  Metamucil 30.9 % Powd (Psyllium) .... Otc as directed. 12)  Flonase 50 Mcg/act Susp (Fluticasone propionate) .Marland Kitchen.. 1 spray in each nostril once daily for 2 weeks for ear problem  stop it if you get a nosebleed as needed 13)  Probiotic Caps (Probiotic product) .... Take 1 capsule by mouth once a day 14)  Ventolin Hfa 108 (90 Base) Mcg/act Aers (Albuterol sulfate) .... Take 2 puffs by mouth every 4 hours as needed for shortness of breath or cough 15)  Pepto-bismol Max Strength 525 Mg/58ml Susp (Bismuth subsalicylate) .... Otc as directed. 16)  Ketoconazole 2 % Sham (Ketoconazole) .... Use as directed twice weekly as needed  Patient Instructions: 1)  I do not think you are having a fever  2)  usually a fever causes chills (feeling cold) and sweats and body aches and fatigue 3)  check your temperature at home occasionally  4)   your facial feeling of heat may actually be from menopause  5)  let me know if your symptoms change or worsen    Orders Added: 1)  Est. Patient Level III [04540]    Current Allergies (reviewed today): ! AUGMENTIN * ACE INHIBITORS GROUP IBUPROFEN (IBUPROFEN)

## 2010-02-21 NOTE — Assessment & Plan Note (Signed)
Summary: ROA FOR 6 MONTH FOLLOW-UP/JRR   Vital Signs:  Patient profile:   55 year old female Weight:      194.75 pounds BMI:     35.75 Temp:     97.7 degrees F oral Pulse rate:   80 / minute Pulse rhythm:   regular BP sitting:   128 / 84  (left arm) Cuff size:   large  Vitals Entered By: Sydell Axon LPN (February 04, 2010 8:30 AM) CC: 6 Month follow-up   History of Present Illness: here for 6 month f/u of DM and HTN and elevated transaminases and lipids  she is doing well overall without complaints    ast /alt are stable at 50/36  DM is imp with metformin AIc is 6.8 down from 7.1   lipids are stable Last Lipid ProfileCholesterol: 148 (01/28/2010 8:31:21 AM)HDL:  35.60 (01/28/2010 8:31:21 AM)LDL:  82 (01/28/2010 8:31:21 AM)Triglycerides:  Last Liver profileSGOT:  36 (01/28/2010 8:31:21 AM)SPGT:  50 (01/28/2010 8:31:21 AM)T. Bili:  0.3 (12/03/2009 3:21:51 PM)Alk Phos:  143 (12/03/2009 3:21:51 PM)   recently came in with cold and had nosebleed   she was in [place]ut [place] and her lipitor went up to 120$  needs px that is generic -- new px  bp 128/84   has lost 5 lb since november is avoding sugars and fats      Allergies: 1)  ! Augmentin 2)  * Ace Inhibitors Group 3)  Ibuprofen (Ibuprofen)  Past History:  Past Medical History: Last updated: 10/02/2009 GERD Hyperlipidemia Hypertension obesity MR hyperglycemia all rhinitis OA in R foot glaucoma chronic low back pain with radiculopathy (deg disk)  podiatry Dr Ralene Cork dentist Dr Ulysees Barns Dr Dione Booze derm-- Dr Terri Piedra ortho Ophelia Charter  Past Surgical History: Last updated: 03/23/2008 Hysterectomy arm fx- past cataract surgery   Family History: Last updated: 20-Feb-2009 mother DM- deceased father - deceased  aunt DM- deceased , CVA, pacemaker   Social History: Last updated: 08/13/2007 lives alone- with help from neighbors currently- disability for arm fx non smoker no alcohol   Review of  Systems General:  Denies fatigue, loss of appetite, and malaise. Eyes:  Denies blurring and eye irritation. CV:  Denies chest pain or discomfort, palpitations, shortness of breath with exertion, and swelling of feet. Resp:  Denies cough, shortness of breath, and wheezing. GI:  Denies abdominal pain, change in bowel habits, indigestion, and nausea. GU:  Denies dysuria, urinary frequency, and urinary hesitancy. MS:  Denies joint pain, joint redness, joint swelling, and cramps. Derm:  Denies itching, lesion(s), poor wound healing, and rash. Neuro:  Denies numbness and tingling. Psych:  Denies anxiety and depression. Endo:  Denies cold intolerance, excessive thirst, and heat intolerance. Heme:  Denies abnormal bruising and bleeding.  Physical Exam  General:  overweight but generally well appearing  Head:  normocephalic, atraumatic, and no abnormalities observed.   Eyes:  vision grossly intact, pupils equal, pupils round, and pupils reactive to light.  no conjunctival pallor, injection or icterus  Mouth:  pharynx pink and moist.   Neck:  supple with full rom and no masses or thyromegally, no JVD or carotid bruit  Chest Wall:  No deformities, masses, or tenderness noted. Lungs:  Normal respiratory effort, chest expands symmetrically. Lungs are clear to auscultation, no crackles or wheezes. Heart:  Normal rate and regular rhythm. S1 and S2 normal without gallop, murmur, click, rub or other extra sounds. Abdomen:  Bowel sounds positive,abdomen soft and non-tender without masses, organomegaly or hernias  noted. no renal bruits  Msk:  No deformity or scoliosis noted of thoracic or lumbar spine.  no acute joint changes  Pulses:  2+ rad pulses Extremities:  No clubbing, cyanosis, edema, or deformity noted with normal full range of motion of all joints.   Neurologic:  sensation intact to light touch and DTRs symmetrical and normal.   Skin:  abrasion healing R ankle  dry skin Cervical Nodes:  No  lymphadenopathy noted Inguinal Nodes:  No significant adenopathy Psych:  baseline MR - repeats herself frequently  Diabetes Management Exam:    Foot Exam (with socks and/or shoes not present):       Sensory-Pinprick/Light touch:          Left medial foot (L-4): normal          Left dorsal foot (L-5): normal          Left lateral foot (S-1): normal          Right medial foot (L-4): normal          Right dorsal foot (L-5): normal          Right lateral foot (S-1): normal       Sensory-Monofilament:          Left foot: normal          Right foot: normal       Inspection:          Left foot: normal          Right foot: normal       Nails:          Left foot: normal          Right foot: normal   Impression & Recommendations:  Problem # 1:  TRANSAMINASES, SERUM, ELEVATED (ICD-790.4) Assessment Unchanged  this is stable / likely from fatty liver and mild disc imp wt loss continue to monitor f/u 6 mo   Orders: Prescription Created Electronically [phone])  Problem # 2:  DIABETES MELLITUS, TYPE II (ICD-250.00) Assessment: Improved  improved with metformin so far  continue dose rev briefly DM diet  rev need for exercise and wt loss  pt cannot take ace Her updated medication list for this problem includes:    Aspirin 81 Mg Tabs (Aspirin) .Marland Kitchen... 1 by mouth once daily with food    Metformin Hcl 500 Mg Tabs (Metformin hcl) .Marland Kitchen... Take one by mouth two times a day  Labs Reviewed: Creat: 0.5 (01/28/2010)     Last Eye Exam: normal (11/06/2009) Reviewed HgBA1c results: 6.8 (01/28/2010)  7.1 (08/06/2009)  Orders: Prescription Created Electronically [phone])  Problem # 3:  HYPERTENSION (ICD-401.9) Assessment: Unchanged  controlled on current meds ace intolerant  Her updated medication list for this problem includes:    Cartia Xt 240 Mg Cp24 (Diltiazem hcl coated beads) .Marland Kitchen... 1 by mouth once daily    Hydrochlorothiazide 12.5 Mg Tabs (Hydrochlorothiazide) .Marland Kitchen... Take 1 tablet by  mouth once a day  BP today: 128/84 Prior BP: 118/78 (02/01/2010)  Labs Reviewed: K+: 4.0 (01/28/2010) Creat: : 0.5 (01/28/2010)   Chol: 148 (01/28/2010)   HDL: 35.60 (01/28/2010)   LDL: 82 (01/28/2010)   TG: 150.0 (01/28/2010)  Orders: Prescription Created Electronically [phone])  Problem # 4:  HYPERLIPIDEMIA (ICD-272.4) Assessment: Unchanged  this is stable need to change to generic for cost  f/u 6 mo for check up rev labs with pt  Her updated medication list for this problem includes:    Lipitor 20 Mg Tabs (Atorvastatin calcium) .Marland KitchenMarland KitchenMarland KitchenMarland Kitchen  Take one by mouth daily generic please  Labs Reviewed: SGOT: 36 (01/28/2010)   SGPT: 50 (01/28/2010)   HDL:35.60 (01/28/2010), 29.60 (05/15/2009)  LDL:82 (01/28/2010), 78 (05/15/2009)  Chol:148 (01/28/2010), 147 (05/15/2009)  Trig:150.0 (01/28/2010), 196.0 (05/15/2009)  Orders: Prescription Created Electronically [id number])  Complete Medication List: 1)  Desonide 0.05 % Crea (Desonide) 2)  Cartia Xt 240 Mg Cp24 (Diltiazem hcl coated beads) .Marland Kitchen.. 1 by mouth once daily 3)  Lipitor 20 Mg Tabs (Atorvastatin calcium) .... Take one by mouth daily generic please 4)  Omeprazole 20 Mg Cpdr (Omeprazole) .Marland Kitchen.. 1 by mouth every am 5)  Hydrochlorothiazide 12.5 Mg Tabs (Hydrochlorothiazide) .... Take 1 tablet by mouth once a day 6)  Klor-con M20 20 Meq Tbcr (Potassium chloride crys cr) .... Take 1 tablet by mouth once a day 7)  Calcium 500/d 500-200 Mg-unit Tabs (Calcium carbonate-vitamin d) .... Once daily 8)  Clarinex 5 Mg Tabs (Desloratadine) .Marland Kitchen.. 1 by mouth once daily as needed allergies 9)  Aspirin 81 Mg Tabs (Aspirin) .Marland Kitchen.. 1 by mouth once daily with food 10)  Metformin Hcl 500 Mg Tabs (Metformin hcl) .... Take one by mouth two times a day 11)  Metamucil 30.9 % Powd (Psyllium) .... Otc as directed. 12)  Flonase 50 Mcg/act Susp (Fluticasone propionate) .Marland Kitchen.. 1 spray in each nostril once daily for 2 weeks for ear problem  stop it if you get a nosebleed as  needed 13)  Probiotic Caps (Probiotic product) .... Take 1 capsule by mouth once a day 14)  Ventolin Hfa 108 (90 Base) Mcg/act Aers (Albuterol sulfate) .... Take 2 puffs by mouth every 4 hours as needed for shortness of breath or cough 15)  Pepto-bismol Max Strength 525 Mg/64ml Susp (Bismuth subsalicylate) .... Otc as directed. 16)  Ketoconazole 2 % Sham (Ketoconazole) .... Use as directed twice weekly as needed  Patient Instructions: 1)  here is prescription for generic lipitor - that should be covered  2)  we will refil other medicines  3)  I will refil other medicines 4)  walk or do other exercise every day 5)  work hard on weight loss - low sugar and low fat  6)  schedule fasting lab and then f/u 6 months for a check up 30 min  Prescriptions: LIPITOR 20 MG TABS (ATORVASTATIN CALCIUM) take one by mouth daily generic please  #30 x 11   Entered and Authorized by:   Judith Part MD   Signed by:   Judith Part MD on 02/04/2010   Method used:   Print then Give to Patient   RxID:   [id number] CARTIA XT 240 MG  CP24 (DILTIAZEM HCL COATED BEADS) 1 by mouth once daily  #30 x 11   Entered and Authorized by:   Judith Part MD   Signed by:   Judith Part MD on 02/04/2010   Method used:   Electronically to        [facility]  [address] (retail)       [address]       [place], [place]  [address]       Ph: [phone] or [phone]       Fax: [phone]   RxID:   [id number] HYDROCHLOROTHIAZIDE 12.5 MG  TABS (HYDROCHLOROTHIAZIDE) Take 1 tablet by mouth once a day  #30 x 11   Entered and Authorized by:   Judith Part MD   Signed by:   Judith Part MD on 02/04/2010   Method used:  [facility] to        [facility]  [address] (retail)       [address]       [place], [place]  [address]       Ph: [phone] or [phone]       Fax: [phone]   RxID:   [id number] OMEPRAZOLE 20 MG  CPDR (OMEPRAZOLE) 1 by mouth every am  #30 x 11    Entered and Authorized by:   Judith Part MD   Signed by:   Judith Part MD on 02/04/2010   Method used:   [facility]        [facility]  [id number] ([facility]       [address]       [place], [place]  [address]       Ph: [phone] or [phone]       Fax: [phone]   RxID:   [id number]    Orders Added: 1)  Prescription Created Electronically [G8553] 2)  Est. Patient Level IV [20254]   Immunization History:  Influenza Immunization History:    Influenza:  fluvax 3+ (10/25/2009)   Immunization History:  Influenza Immunization History:    Influenza:  Fluvax 3+ (10/25/2009)  Current Allergies (reviewed today): ! AUGMENTIN * ACE INHIBITORS GROUP IBUPROFEN (IBUPROFEN)

## 2010-03-05 ENCOUNTER — Encounter: Payer: Self-pay | Admitting: Family Medicine

## 2010-03-05 ENCOUNTER — Encounter (INDEPENDENT_AMBULATORY_CARE_PROVIDER_SITE_OTHER): Payer: Medicare Other | Admitting: Family Medicine

## 2010-03-05 DIAGNOSIS — K589 Irritable bowel syndrome without diarrhea: Secondary | ICD-10-CM | POA: Insufficient documentation

## 2010-03-05 DIAGNOSIS — H9319 Tinnitus, unspecified ear: Secondary | ICD-10-CM | POA: Insufficient documentation

## 2010-03-05 LAB — HM DIABETES FOOT EXAM

## 2010-03-13 ENCOUNTER — Encounter: Payer: Self-pay | Admitting: Family Medicine

## 2010-03-13 ENCOUNTER — Ambulatory Visit (INDEPENDENT_AMBULATORY_CARE_PROVIDER_SITE_OTHER): Payer: Medicare Other | Admitting: Family Medicine

## 2010-03-13 DIAGNOSIS — N898 Other specified noninflammatory disorders of vagina: Secondary | ICD-10-CM | POA: Insufficient documentation

## 2010-03-13 LAB — CONVERTED CEMR LAB: Whiff Test: NEGATIVE

## 2010-03-18 ENCOUNTER — Ambulatory Visit (INDEPENDENT_AMBULATORY_CARE_PROVIDER_SITE_OTHER): Payer: Medicare Other | Admitting: Family Medicine

## 2010-03-18 ENCOUNTER — Encounter: Payer: Self-pay | Admitting: Family Medicine

## 2010-03-18 DIAGNOSIS — R110 Nausea: Secondary | ICD-10-CM

## 2010-03-19 ENCOUNTER — Ambulatory Visit (INDEPENDENT_AMBULATORY_CARE_PROVIDER_SITE_OTHER): Payer: Medicare Other | Admitting: Family Medicine

## 2010-03-19 ENCOUNTER — Encounter: Payer: Self-pay | Admitting: Family Medicine

## 2010-03-19 DIAGNOSIS — M549 Dorsalgia, unspecified: Secondary | ICD-10-CM

## 2010-03-19 LAB — CONVERTED CEMR LAB
Glucose, Urine, Semiquant: NEGATIVE
Specific Gravity, Urine: 1.015
pH: 6

## 2010-03-19 NOTE — Assessment & Plan Note (Signed)
Summary: RIGHT EAR/CLE   Vital Signs:  Patient profile:   55 year old female Height:      62 inches Weight:      191.25 pounds BMI:     35.11 Temp:     97.6 degrees F oral Pulse rate:   80 / minute Pulse rhythm:   regular BP sitting:   124 / 78  (left arm) Cuff size:   large  Vitals Entered By: Lewanda Rife LPN (March 05, 2010 2:30 PM) CC: Rt ear makes noise?   History of Present Illness: is having trouble in R ear  every once in a while her R ear makes a noise  wanted to get it checked out  high pitched constant noise - just on and off and lasts a little while   also urgent loose stool this am  better now  no pain or n/v  going on church trip and worried it will happen again     Allergies: 1)  ! Augmentin 2)  * Ace Inhibitors Group 3)  Ibuprofen (Ibuprofen)  Past History:  Past Medical History: Last updated: 10/02/2009 GERD Hyperlipidemia Hypertension obesity MR hyperglycemia all rhinitis OA in R foot glaucoma chronic low back pain with radiculopathy (deg disk)  podiatry Dr Ralene Cork dentist Dr Ulysees Barns Dr Dione Booze derm-- Dr Terri Piedra ortho Ophelia Charter  Past Surgical History: Last updated: 03/23/2008 Hysterectomy arm fx- past cataract surgery   Family History: Last updated: 17-Feb-2009 mother DM- deceased father - deceased  aunt DM- deceased , CVA, pacemaker   Social History: Last updated: 08/13/2007 lives alone- with help from neighbors currently- disability for arm fx non smoker no alcohol   Review of Systems General:  Denies chills, fatigue, fever, loss of appetite, and malaise. Eyes:  Denies blurring and eye irritation. ENT:  Complains of ringing in ears; denies earache, nasal congestion, and postnasal drainage. CV:  Denies chest pain or discomfort, lightheadness, and palpitations. Resp:  Denies cough, shortness of breath, and wheezing. GI:  Complains of diarrhea; denies abdominal pain, change in bowel habits, gas, indigestion, and  nausea. GU:  Denies dysuria and urinary frequency. MS:  Denies joint pain, joint swelling, and muscle weakness. Derm:  Denies itching and rash. Neuro:  Denies tingling, tremors, and weakness. Endo:  Denies cold intolerance and heat intolerance. Heme:  Denies abnormal bruising and bleeding.  Physical Exam  General:  overweight but generally well appearing  Head:  normocephalic, atraumatic, and no abnormalities observed.  no sinus tenderness Eyes:  vision grossly intact, pupils equal, and pupils reactive to light.   Ears:  R ear normal and L ear normal.  scant to no cerumen grossly nl hearing  Nose:  no nasal discharge.   Mouth:  pharynx pink and moist.   Abdomen:  Bowel sounds positive,abdomen soft and non-tender without masses, organomegaly or hernias noted. Extremities:  No clubbing, cyanosis, edema, or deformity noted with normal full range of motion of all joints.   Skin:  Intact without suspicious lesions or rashes Cervical Nodes:  No lymphadenopathy noted Inguinal Nodes:  No significant adenopathy Psych:  baseline MR - repeats herself frequently  Diabetes Management Exam:    Foot Exam (with socks and/or shoes not present):       Sensory-Pinprick/Light touch:          Left medial foot (L-4): normal          Left dorsal foot (L-5): normal          Left lateral foot (S-1):  normal          Right medial foot (L-4): normal          Right dorsal foot (L-5): normal          Right lateral foot (S-1): normal       Sensory-Monofilament:          Left foot: normal          Right foot: normal       Inspection:          Left foot: normal          Right foot: normal       Nails:          Left foot: normal          Right foot: normal   Impression & Recommendations:  Problem # 1:  IRRITABLE BOWEL SYNDROME (ICD-564.1) Assessment New with occ urgent stool that is loose and hard to control  disc strategy for this incl diet/fiber/probiotic  will try otc immodium before church trip  to prevent problems  can also wear protective undergarments adv to udpate me if worse or other symptoms   Problem # 2:  TINNITUS (ICD-388.30) Assessment: New this is new with nl exam and grossly nl hearing  disc poss early sensorineural hearing loss- and imp of protecting against loud noise handout given from [facility] if worse will consder ent ref  Complete Medication List: 1)  Desonide 0.05 % Crea (Desonide) 2)  Cartia Xt 240 Mg Cp24 (Diltiazem hcl coated beads) .Marland Kitchen.. 1 by mouth once daily 3)  Lipitor 20 Mg Tabs (Atorvastatin calcium) .... Take one by mouth daily generic please 4)  Omeprazole 20 Mg Cpdr (Omeprazole) .Marland Kitchen.. 1 by mouth every am 5)  Hydrochlorothiazide 12.5 Mg Tabs (Hydrochlorothiazide) .... Take 1 tablet by mouth once a day 6)  Klor-con M20 20 Meq Tbcr (Potassium chloride crys cr) .... Take 1 tablet by mouth once a day 7)  Calcium 500/d 500-200 Mg-unit Tabs (Calcium carbonate-vitamin d) .... Once daily 8)  Clarinex 5 Mg Tabs (Desloratadine) .Marland Kitchen.. 1 by mouth once daily as needed allergies 9)  Aspirin 81 Mg Tabs (Aspirin) .Marland Kitchen.. 1 by mouth once daily with food 10)  Metformin Hcl 500 Mg Tabs (Metformin hcl) .... Take one by mouth two times a day 11)  Metamucil 30.9 % Powd (Psyllium) .... Otc as directed. 12)  Flonase 50 Mcg/act Susp (Fluticasone propionate) .Marland Kitchen.. 1 spray in each nostril once daily for 2 weeks for ear problem  stop it if you get a nosebleed as needed 13)  Probiotic Caps (Probiotic product) .... Take 1 capsule by mouth once a day 14)  Ventolin Hfa 108 (90 Base) Mcg/act Aers (Albuterol sulfate) .... Take 2 puffs by mouth every 4 hours as needed for shortness of breath or cough 15)  Pepto-bismol Max Strength 525 Mg/34ml Susp (Bismuth subsalicylate) .... Otc as directed. 16)  Ketoconazole 2 % Sham (Ketoconazole) .... Use as directed twice weekly as needed  Patient Instructions: 1)  the ear ringing is probably a sign of early hearing loss - if it gets worse let me know 2)   try to avoid loud noises  3)  for your church trip -is ok to take one dose of anti diarrhea medicine before you go  4)  depends undergarments are ok too 5)  let me know if that gets worse or any abdominal pain or fever or other symptoms    Orders Added: 1)  Est. Patient Level III [  99213]    Current Allergies (reviewed today): ! AUGMENTIN * ACE INHIBITORS GROUP IBUPROFEN (IBUPROFEN)

## 2010-03-19 NOTE — Assessment & Plan Note (Signed)
Summary: ?YEAST INFECTION/CLE   Vital Signs:  Patient profile:   55 year old female Height:      62 inches Weight:      191.50 pounds BMI:     35.15 Temp:     98.6 degrees F oral Pulse rate:   80 / minute Pulse rhythm:   regular BP sitting:   126 / 78  (left arm) Cuff size:   large  Vitals Entered By: Linde Gillis CMA [facility] Dull) (March 13, 2010 8:28 AM) CC: possible yeast infection   History of Present Illness: 2 days of vaginal irriation and increased discharge. Using topical anitfungal which is helping a little bit. No dysuria, fever or back pain. No increased urinary frequency.  Current Medications (verified): 1)  Desonide 0.05 % Crea (Desonide) 2)  Cartia Xt 240 Mg  Cp24 (Diltiazem Hcl Coated Beads) .Marland Kitchen.. 1 By Mouth Once Daily 3)  Lipitor 20 Mg Tabs (Atorvastatin Calcium) .... Take One By Mouth Daily Generic Please 4)  Omeprazole 20 Mg  Cpdr (Omeprazole) .Marland Kitchen.. 1 By Mouth Every Am 5)  Hydrochlorothiazide 12.5 Mg  Tabs (Hydrochlorothiazide) .... Take 1 Tablet By Mouth Once A Day 6)  Klor-Con M20 20 Meq  Tbcr (Potassium Chloride Crys Cr) .... Take 1 Tablet By Mouth Once A Day 7)  Calcium 500/d 500-200 Mg-Unit  Tabs (Calcium Carbonate-Vitamin D) .... Once Daily 8)  Clarinex 5 Mg  Tabs (Desloratadine) .Marland Kitchen.. 1 By Mouth Once Daily As Needed Allergies 9)  Aspirin 81 Mg Tabs (Aspirin) .Marland Kitchen.. 1 By Mouth Once Daily With Food 10)  Metformin Hcl 500 Mg Tabs (Metformin Hcl) .... Take One By Mouth Two Times A Day 11)  Metamucil 30.9 % Powd (Psyllium) .... Otc As Directed. 12)  Flonase 50 Mcg/act Susp (Fluticasone Propionate) .Marland Kitchen.. 1 Spray in Each Nostril Once Daily For 2 Weeks For Ear Problem  Stop It If You Get A Nosebleed As Needed 13)  Probiotic  Caps (Probiotic Product) .... Take 1 Capsule By Mouth Once A Day 14)  Ventolin Hfa 108 (90 Base) Mcg/act Aers (Albuterol Sulfate) .... Take 2 Puffs By Mouth Every 4 Hours As Needed For Shortness of Breath or Cough 15)  Pepto-Bismol Max Strength 525  Mg/57ml Susp (Bismuth Subsalicylate) .... Otc As Directed. 16)  Ketoconazole 2 % Sham (Ketoconazole) .... Use As Directed Twice Weekly As Needed 17)  Diflucan 150 Mg Tabs (Fluconazole) .... Once Daily  Allergies: 1)  ! Augmentin 2)  * Ace Inhibitors Group 3)  Ibuprofen (Ibuprofen)  Past History:  Past Medical History: Last updated: 10/02/2009 GERD Hyperlipidemia Hypertension obesity MR hyperglycemia all rhinitis OA in R foot glaucoma chronic low back pain with radiculopathy (deg disk)  podiatry Dr Ralene Cork dentist Dr Ulysees Barns Dr Dione Booze derm-- Dr Terri Piedra ortho [facility]  Past Surgical History: Last updated: 03/23/2008 Hysterectomy arm fx- past cataract surgery   Family History: Last updated: 04-Mar-2009 mother DM- deceased father - deceased  aunt DM- deceased , CVA, pacemaker   Social History: Last updated: 08/13/2007 lives alone- with help from neighbors currently- disability for arm fx non smoker no alcohol   Review of Systems      See HPI General:  Denies fever. GI:  Denies abdominal pain. GU:  Complains of discharge; denies genital sores and urinary frequency.  Physical Exam  General:  overweight but generally well appearing  Genitalia:  mild hyperemia of labia minora  no excoriations  small amount white discharge Psych:  baseline MR - repeats herself frequently   Impression &  Recommendations:  Problem # 1:  VAGINAL DISCHARGE (ICD-623.5) Assessment New Wet prep consistent with vaginal yeast infection. Rx given for oral diflucan. Continue topical antifungal if it provides relief. Pt in agreement. Orders: Wet Prep ([id number]) Prescription Created Electronically [id number])  Complete Medication List: 1)  Desonide 0.05 % Crea (Desonide) 2)  Cartia Xt 240 Mg Cp24 (Diltiazem hcl coated beads) .Marland Kitchen.. 1 by mouth once daily 3)  Lipitor 20 Mg Tabs (Atorvastatin calcium) .... Take one by mouth daily generic please 4)  Omeprazole 20 Mg Cpdr (Omeprazole)  .Marland Kitchen.. 1 by mouth every am 5)  Hydrochlorothiazide 12.5 Mg Tabs (Hydrochlorothiazide) .... Take 1 tablet by mouth once a day 6)  Klor-con M20 20 Meq Tbcr (Potassium chloride crys cr) .... Take 1 tablet by mouth once a day 7)  Calcium 500/d 500-200 Mg-unit Tabs (Calcium carbonate-vitamin d) .... Once daily 8)  Clarinex 5 Mg Tabs (Desloratadine) .Marland Kitchen.. 1 by mouth once daily as needed allergies 9)  Aspirin 81 Mg Tabs (Aspirin) .Marland Kitchen.. 1 by mouth once daily with food 10)  Metformin Hcl 500 Mg Tabs (Metformin hcl) .... Take one by mouth two times a day 11)  Metamucil 30.9 % Powd (Psyllium) .... Otc as directed. 12)  Flonase 50 Mcg/act Susp (Fluticasone propionate) .Marland Kitchen.. 1 spray in each nostril once daily for 2 weeks for ear problem  stop it if you get a nosebleed as needed 13)  Probiotic Caps (Probiotic product) .... Take 1 capsule by mouth once a day 14)  Ventolin Hfa 108 (90 Base) Mcg/act Aers (Albuterol sulfate) .... Take 2 puffs by mouth every 4 hours as needed for shortness of breath or cough 15)  Pepto-bismol Max Strength 525 Mg/67ml Susp (Bismuth subsalicylate) .... Otc as directed. 16)  Ketoconazole 2 % Sham (Ketoconazole) .... Use as directed twice weekly as needed 17)  Diflucan 150 Mg Tabs (Fluconazole) .... Once daily Prescriptions: DIFLUCAN 150 MG TABS (FLUCONAZOLE) once daily  #1 x 0   Entered and Authorized by:   Ruthe Mannan MD   Signed by:   Ruthe Mannan MD on 03/13/2010   Method used:   [facility]        [facility] [id number]       [address]       [place], [place]  [address]       Ph: [phone] or [phone]       Fax: [phone]   RxID:   [id number]    Orders Added: 1)  Wet Prep [[id number]] 2)  Est. Patient Level III [64332] 3)  Prescription Created Electronically [phone]    Current Allergies (reviewed today): ! AUGMENTIN * ACE INHIBITORS GROUP IBUPROFEN (IBUPROFEN)  Laboratory Results    Wet [place]/KOH Source: vaginal WBC/hpf 1-5 Clue  cells/hpf none  Negative whiff Yeast/hpf moderate Trichomonas/hpf none

## 2010-03-20 ENCOUNTER — Encounter: Payer: Self-pay | Admitting: Family Medicine

## 2010-03-26 ENCOUNTER — Encounter (INDEPENDENT_AMBULATORY_CARE_PROVIDER_SITE_OTHER): Payer: Self-pay | Admitting: *Deleted

## 2010-03-28 ENCOUNTER — Ambulatory Visit (INDEPENDENT_AMBULATORY_CARE_PROVIDER_SITE_OTHER)
Admission: RE | Admit: 2010-03-28 | Discharge: 2010-03-28 | Disposition: A | Discharge status: Home / Self Care | Payer: Medicare Other | Source: Ambulatory Visit | Attending: Family Medicine | Admitting: Family Medicine

## 2010-03-28 ENCOUNTER — Ambulatory Visit (INDEPENDENT_AMBULATORY_CARE_PROVIDER_SITE_OTHER): Payer: Medicare Other | Admitting: Family Medicine

## 2010-03-28 ENCOUNTER — Other Ambulatory Visit: Payer: Self-pay | Admitting: Family Medicine

## 2010-03-28 ENCOUNTER — Encounter: Payer: Self-pay | Admitting: Family Medicine

## 2010-03-28 DIAGNOSIS — M25579 Pain in unspecified ankle and joints of unspecified foot: Secondary | ICD-10-CM

## 2010-03-28 NOTE — Assessment & Plan Note (Signed)
Summary: STOMACH,COUGH/CLE   Vital Signs:  Patient profile:   55 year old female Weight:      188.25 pounds Temp:     98.1 degrees F oral Pulse rate:   72 / minute Pulse rhythm:   regular BP sitting:   118 / 78  (left arm) Cuff size:   large  Vitals Entered By: Selena Batten Dance CMA [facility]) (March 18, 2010 8:55 AM) CC: Cough and nausea   History of Present Illness: CC: cough  over weekend, coughed real hard and phlegm came up.  Saturday morning went to drug store to talk with pharmacist, he gave halls Vit C defense cough syrups (no sugar).  Pt has ben using them.  Sunday morning (1d ago) stomach started bothering her.  Started eating chicken noodle, crackers, and ginger ale.  Last night felt very nauseated and with chill so went to bathroom, but nothing came up.  Bowels have moved a couple times (no diarrhea).  No abd pain throughout all of this.    Cough now better "off and on".  No congestion, RN, HA, ST, myalgias, arthrlagias.  no sick contacts that pt can think of.  no new foods other than halls cough drops.  Current Medications (verified): 1)  Desonide 0.05 % Crea (Desonide) 2)  Cartia Xt 240 Mg  Cp24 (Diltiazem Hcl Coated Beads) .Marland Kitchen.. 1 By Mouth Once Daily 3)  Lipitor 20 Mg Tabs (Atorvastatin Calcium) .... Take One By Mouth Daily Generic Please 4)  Omeprazole 20 Mg  Cpdr (Omeprazole) .Marland Kitchen.. 1 By Mouth Every Am 5)  Hydrochlorothiazide 12.5 Mg  Tabs (Hydrochlorothiazide) .... Take 1 Tablet By Mouth Once A Day 6)  Klor-Con M20 20 Meq  Tbcr (Potassium Chloride Crys Cr) .... Take 1 Tablet By Mouth Once A Day 7)  Calcium 500/d 500-200 Mg-Unit  Tabs (Calcium Carbonate-Vitamin D) .... Once Daily 8)  Clarinex 5 Mg  Tabs (Desloratadine) .Marland Kitchen.. 1 By Mouth Once Daily As Needed Allergies 9)  Aspirin 81 Mg Tabs (Aspirin) .Marland Kitchen.. 1 By Mouth Once Daily With Food 10)  Metformin Hcl 500 Mg Tabs (Metformin Hcl) .... Take One By Mouth Two Times A Day 11)  Metamucil 30.9 % Powd (Psyllium) .... Otc As  Directed. 12)  Flonase 50 Mcg/act Susp (Fluticasone Propionate) .Marland Kitchen.. 1 Spray in Each Nostril Once Daily For 2 Weeks For Ear Problem  Stop It If You Get A Nosebleed As Needed 13)  Probiotic  Caps (Probiotic Product) .... Take 1 Capsule By Mouth Once A Day 14)  Ventolin Hfa 108 (90 Base) Mcg/act Aers (Albuterol Sulfate) .... Take 2 Puffs By Mouth Every 4 Hours As Needed For Shortness of Breath or Cough 15)  Pepto-Bismol Max Strength 525 Mg/35ml Susp (Bismuth Subsalicylate) .... Otc As Directed. 16)  Ketoconazole 2 % Sham (Ketoconazole) .... Use As Directed Twice Weekly As Needed  Allergies: 1)  ! Augmentin 2)  * Ace Inhibitors Group 3)  Ibuprofen (Ibuprofen)  Past History:  Past Medical History: Last updated: 10/02/2009 GERD Hyperlipidemia Hypertension obesity MR hyperglycemia all rhinitis OA in R foot glaucoma chronic low back pain with radiculopathy (deg disk)  podiatry Dr Ralene Cork dentist Dr Ulysees Barns Dr Dione Booze derm-- Dr Terri Piedra ortho - [facility]  Social History: Last updated: 08/13/2007 lives alone- with help from neighbors currently- disability for arm fx non smoker no alcohol   Review of Systems       per HPI  Physical Exam  General:  overweight but generally well appearing  Head:  normocephalic, atraumatic, and  no abnormalities observed.  no sinus tenderness Eyes:  vision grossly intact, pupils equal, and pupils reactive to light.   Ears:  R ear normal and L ear normal.  scant to no cerumen Nose:  no nasal discharge.   Mouth:  pharynx pink and moist.   Neck:  supple with full rom and no masses or thyromegally, no JVD or carotid bruit  Lungs:  Normal respiratory effort, chest expands symmetrically. Lungs are clear to auscultation, no crackles or wheezes. Heart:  Normal rate and regular rhythm. S1 and S2 normal without gallop, murmur, click, rub or other extra sounds. Abdomen:  Bowel sounds positive,abdomen soft and non-tender without masses, organomegaly or  hernias noted.  obese. Pulses:  2+ rad pulses Extremities:  no pedal edema   Impression & Recommendations:  Problem # 1:  NAUSEA (ICD-787.02) unclear etiology.  ? onset of viral illness.  advised to update [place] if not improving as expected or any abd pain, worsening chills.    Her updated medication list for this problem includes:    Clarinex 5 Mg Tabs (Desloratadine) .Marland Kitchen... 1 by mouth once daily as needed allergies  Complete Medication List: 1)  Desonide 0.05 % Crea (Desonide) 2)  Cartia Xt 240 Mg Cp24 (Diltiazem hcl coated beads) .Marland Kitchen.. 1 by mouth once daily 3)  Lipitor 20 Mg Tabs (Atorvastatin calcium) .... Take one by mouth daily generic please 4)  Omeprazole 20 Mg Cpdr (Omeprazole) .Marland Kitchen.. 1 by mouth every am 5)  Hydrochlorothiazide 12.5 Mg Tabs (Hydrochlorothiazide) .... Take 1 tablet by mouth once a day 6)  Klor-con M20 20 Meq Tbcr (Potassium chloride crys cr) .... Take 1 tablet by mouth once a day 7)  Calcium 500/d 500-200 Mg-unit Tabs (Calcium carbonate-vitamin d) .... Once daily 8)  Clarinex 5 Mg Tabs (Desloratadine) .Marland Kitchen.. 1 by mouth once daily as needed allergies 9)  Aspirin 81 Mg Tabs (Aspirin) .Marland Kitchen.. 1 by mouth once daily with food 10)  Metformin Hcl 500 Mg Tabs (Metformin hcl) .... Take one by mouth two times a day 11)  Metamucil 30.9 % Powd (Psyllium) .... Otc as directed. 12)  Flonase 50 Mcg/act Susp (Fluticasone propionate) .Marland Kitchen.. 1 spray in each nostril once daily for 2 weeks for ear problem  stop it if you get a nosebleed as needed 13)  Probiotic Caps (Probiotic product) .... Take 1 capsule by mouth once a day 14)  Ventolin Hfa 108 (90 Base) Mcg/act Aers (Albuterol sulfate) .... Take 2 puffs by mouth every 4 hours as needed for shortness of breath or cough 15)  Pepto-bismol Max Strength 525 Mg/10ml Susp (Bismuth subsalicylate) .... Otc as directed. 16)  Ketoconazole 2 % Sham (Ketoconazole) .... Use as directed twice weekly as needed  Patient Instructions: 1)  I'm not quite sure  what's causing the nausea.  I'm glad it's better though. 2)  This could be beginning of viral infection. 3)  Good to see you today, call us if not improving as expected or if any belly pain or fevers or worsening. 4)  Keep taking small meals throughout the day, push small sips of fluid to stay well hydrated.   Orders Added: 1)  Est. Patient Level III [16109]    Current Allergies (reviewed today): ! AUGMENTIN * ACE INHIBITORS GROUP IBUPROFEN (IBUPROFEN)

## 2010-03-28 NOTE — Assessment & Plan Note (Signed)
Summary: possible kidney infection or kidney stones jrt   Vital Signs:  Patient profile:   55 year old female Weight:      188.25 pounds Temp:     97.7 degrees F oral Pulse rate:   76 / minute Pulse rhythm:   regular BP sitting:   120 / 84  (left arm) Cuff size:   large  Vitals Entered By: Selena Batten Dance CMA [facility] Dull) (March 19, 2010 9:54 AM) CC: ? UTI/kidney stone Comments Left sided back pain   History of Present Illness: CC: ? UTI/kidney stone  L sided lower back pain started this morning.  sore.  Had BM this morning and voided normally.  Movement deteriorates pain as well as pushing on left side.  sore feeling in back.  No fevers/chills, nausea/vomiting.  No dysuria, frequency, urgency.  no numbness, weakness, no radiculopathy down legs, no bowel/bladder incontinence.  Took some chicken broth yesterday and gingerale.  Allergies: 1)  ! Augmentin 2)  * Ace Inhibitors Group 3)  Ibuprofen (Ibuprofen)  Past History:  Past Medical History: Last updated: 10/02/2009 GERD Hyperlipidemia Hypertension obesity MR hyperglycemia all rhinitis OA in R foot glaucoma chronic low back pain with radiculopathy (deg disk)  podiatry Dr Ralene Cork dentist Dr Ulysees Barns Dr Dione Booze derm-- Dr Terri Piedra ortho - [facility]  Social History: Last updated: 08/13/2007 lives alone- with help from neighbors currently- disability for arm fx non smoker no alcohol   Review of Systems       per HPI  Physical Exam  General:  overweight but generally well appearing  Eyes:  vision grossly intact, pupils equal, and pupils reactive to light.   Mouth:  pharynx pink and moist.   Neck:  supple with full rom and no masses or thyromegally, no JVD or carotid bruit  Lungs:  Normal respiratory effort, chest expands symmetrically. Lungs are clear to auscultation, no crackles or wheezes. Heart:  Normal rate and regular rhythm. S1 and S2 normal without gallop, murmur, click, rub or other extra sounds. Abdomen:   Bowel sounds positive,abdomen soft and non-tender without masses, organomegaly or hernias noted.  obese.  neg CVA bilaterally Msk:  main tenderness to palpation is at L SIJ as well as midline upper lumbar spine and L paraspinous mm tenderness.  neg SLR bilaterally, no pain with int/ext rotation at hip.  no pain at GTB bilaterally Pulses:  2+ rad pulses Extremities:  no pedal edema   Impression & Recommendations:  Problem # 1:  BACK PAIN (ICD-724.5) Assessment New more consistent with muscular strain vs sacroiliitis.  treat as such with NSAIDs and flexeril.  U micro - ? contaminated.  sent culture.  if infection start abx.  update if worsening instead of improving.  Her updated medication list for this problem includes:    Aspirin 81 Mg Tabs (Aspirin) .Marland Kitchen... 1 by mouth once daily with food    Flexeril 5 Mg Tabs (Cyclobenzaprine hcl) .Marland Kitchen... Take one twice daily as needed for muscle pain  Orders: UA Dipstick W/ Micro (manual) (16109) Specimen Handling (99000) T-Culture, Urine ([id number])  Complete Medication List: 1)  Desonide 0.05 % Crea (Desonide) 2)  Cartia Xt 240 Mg Cp24 (Diltiazem hcl coated beads) .Marland Kitchen.. 1 by mouth once daily 3)  Lipitor 20 Mg Tabs (Atorvastatin calcium) .... Take one by mouth daily generic please 4)  Omeprazole 20 Mg Cpdr (Omeprazole) .Marland Kitchen.. 1 by mouth every am 5)  Hydrochlorothiazide 12.5 Mg Tabs (Hydrochlorothiazide) .... Take 1 tablet by mouth once a day 6)  Klor-con  M20 20 Meq Tbcr (Potassium chloride crys cr) .... Take 1 tablet by mouth once a day 7)  Calcium 500/d 500-200 Mg-unit Tabs (Calcium carbonate-vitamin d) .... Once daily 8)  Clarinex 5 Mg Tabs (Desloratadine) .Marland Kitchen.. 1 by mouth once daily as needed allergies 9)  Aspirin 81 Mg Tabs (Aspirin) .Marland Kitchen.. 1 by mouth once daily with food 10)  Metformin Hcl 500 Mg Tabs (Metformin hcl) .... Take one by mouth two times a day 11)  Metamucil 30.9 % Powd (Psyllium) .... Otc as directed. 12)  Flonase 50 Mcg/act Susp  (Fluticasone propionate) .Marland Kitchen.. 1 spray in each nostril once daily for 2 weeks for ear problem  stop it if you get a nosebleed as needed 13)  Probiotic Caps (Probiotic product) .... Take 1 capsule by mouth once a day 14)  Ventolin Hfa 108 (90 Base) Mcg/act Aers (Albuterol sulfate) .... Take 2 puffs by mouth every 4 hours as needed for shortness of breath or cough 15)  Pepto-bismol Max Strength 525 Mg/5ml Susp (Bismuth subsalicylate) .... Otc as directed. 16)  Ketoconazole 2 % Sham (Ketoconazole) .... Use as directed twice weekly as needed 17)  Flexeril 5 Mg Tabs (Cyclobenzaprine hcl) .... Take one twice daily as needed for muscle pain  Patient Instructions: 1)  Sounds more like muscular pain, however we have sent urine for culture.  if infection, we will call you to start antibiotics. 2)  Treat for now with tylenol and ice to back (no more than 10 minutes at a time) and muscle relaxant (can make you sleepy so take at night).  Stretching exercises provided. 3)  Please let us know if not improving as expected. 4)  If worsening or any fevers or chills, or nausea/vomiting, return sooner. Prescriptions: FLEXERIL 5 MG TABS (CYCLOBENZAPRINE HCL) take one twice daily as needed for muscle pain  #30 x 0   Entered and Authorized by:   Eustaquio Boyden  MD   Signed by:   Eustaquio Boyden  MD on 03/19/2010   Method used:   [facility]        [facility] [id number]       [address]       [place], [place]  [address]       Ph: [phone] or [phone]       Fax: [phone]   RxID:   [id number]    Orders Added: 1)  Est. Patient Level III [36644] 2)  UA Dipstick W/ Micro (manual) [81000] 3)  Specimen Handling [99000] 4)  T-Culture, Urine [[id number]]    Laboratory Results   Urine Tests  Date/Time Received: March 19, 2010 9:55 AM  Date/Time Reported: March 19, 2010 9:55 AM   Routine Urinalysis   Color: yellow Appearance: Clear Glucose: negative    (Normal Range: Negative) Bilirubin: negative   (Normal Range: Negative) Ketone: negative   (Normal Range: Negative) Spec. Gravity: 1.015   (Normal Range: 1.003-1.035) Blood: trace-lysed   (Normal Range: Negative) pH: 6.0   (Normal Range: 5.0-8.0) Protein: negative   (Normal Range: Negative) Urobilinogen: 0.2   (Normal Range: 0-1) Nitrite: negative   (Normal Range: Negative) Leukocyte Esterace: large   (Normal Range: Negative)  Urine Microscopic WBC/HPF: 5-10 RBC/HPF: rare Bacteria/HPF: tr Mucous/HPF: some Epithelial/HPF: 1-5 Crystals/HPF: no Casts/LPF: no Yeast/HPF: no    Comments: read by .......................Eustaquio Boyden  MD  March 19, 2010 10:47 AM  UCx sent     Current Allergies (reviewed today): ! AUGMENTIN * ACE INHIBITORS GROUP IBUPROFEN (IBUPROFEN)  Appended  Document: possible kidney infection or kidney stones jrt pt drives.  discussed in detail sedation precautions for flexeril, advised not to use when out and about, rather only when at home.  states she will let neighbors know about this and have them keep eye on her as well.

## 2010-04-02 NOTE — Miscellaneous (Signed)
Summary: Flexeril to allergy list  Clinical Lists Changes  Allergies: Added new allergy or adverse reaction of FLEXERIL     Prior Medications: DESONIDE 0.05 % CREA (DESONIDE)  CARTIA XT 240 MG  CP24 (DILTIAZEM HCL COATED BEADS) 1 by mouth once daily LIPITOR 20 MG TABS (ATORVASTATIN CALCIUM) take one by mouth daily generic please OMEPRAZOLE 20 MG  CPDR (OMEPRAZOLE) 1 by mouth every am HYDROCHLOROTHIAZIDE 12.5 MG  TABS (HYDROCHLOROTHIAZIDE) Take 1 tablet by mouth once a day KLOR-CON M20 20 MEQ  TBCR (POTASSIUM CHLORIDE CRYS CR) Take 1 tablet by mouth once a day CALCIUM 500/D 500-200 MG-UNIT  TABS (CALCIUM CARBONATE-VITAMIN D) once daily CLARINEX 5 MG  TABS (DESLORATADINE) 1 by mouth once daily as needed allergies ASPIRIN 81 MG TABS (ASPIRIN) 1 by mouth once daily with food METFORMIN HCL 500 MG TABS (METFORMIN HCL) take one by mouth two times a day METAMUCIL 30.9 % POWD (PSYLLIUM) OTC As directed. FLONASE 50 MCG/ACT SUSP (FLUTICASONE PROPIONATE) 1 spray in each nostril once daily for 2 weeks for ear problem  stop it if you get a nosebleed as needed PROBIOTIC  CAPS (PROBIOTIC PRODUCT) Take 1 capsule by mouth once a day VENTOLIN HFA 108 (90 BASE) MCG/ACT AERS (ALBUTEROL SULFATE) Take 2 puffs by mouth every 4 hours as needed for shortness of breath or cough PEPTO-BISMOL MAX STRENGTH 525 MG/15ML SUSP (BISMUTH SUBSALICYLATE) OTC As directed. KETOCONAZOLE 2 % SHAM (KETOCONAZOLE) use as directed twice weekly as needed FLEXERIL 5 MG TABS (CYCLOBENZAPRINE HCL) take one twice daily as needed for muscle pain Current Allergies: ! AUGMENTIN ! FLEXERIL * ACE INHIBITORS GROUP IBUPROFEN (IBUPROFEN)

## 2010-04-02 NOTE — Assessment & Plan Note (Signed)
Summary: LEFT FOOT PAIN/CLE  MEDICARE   Vital Signs:  Patient profile:   55 year old female Height:      62 inches Weight:      191.75 pounds BMI:     35.20 Temp:     97.7 degrees F oral Pulse rate:   76 / minute Pulse rhythm:   regular BP sitting:   130 / 82  (left arm) Cuff size:   large  Vitals Entered By: Benny Lennert CMA Duncan Dull) (March 28, 2010 2:30 PM)  History of Present Illness: Chief complaint left foot pain  55 year old female:  pleasant 55 year old lady who has moderate MR who presents with left-sided foot pain has been ongoing for some time, and has had a mild limp. She points to pain in the true ankle joint. She is not a specific injury.  No bruising, no significant swelling.  No tenderness any bony prominence.  Left foot, has been walking and has been limping and has been sore.   L true ankle  REVIEW OF SYSTEMS  GEN: No systemic complaints, no fevers, chills, sweats, or other acute illnesses MSK: Detailed in the HPI GI: tolerating PO intake without difficulty Neuro: No numbness, parasthesias, or tingling associated. Otherwise the pertinent positives of the ROS are noted above.      GEN: Well-developed,well-nourished,in no acute distress; alert,appropriate and cooperative throughout examination HEENT: Normocephalic and atraumatic without obvious abnormalities. No apparent alopecia or balding. Ears, externally no deformities PULM: Breathing comfortably in no respiratory distress EXT: No clubbing, cyanosis, or edema PSYCH: Normally interactive. Cooperative during the interview. Pleasant. Friendly and conversant. Not anxious or depressed appearing. Normal, full affect.   Echymosis: no Edema: no ROM: full LE B Gait: heel toe, non-antalgic MT pain: no Callus pattern: none Lateral Mall: NT Medial Mall: NT Talus: MILD FULLNESS AND SWELLING ABOVE TALUS IN TRUE ANKLE JOINT Navicular: NT Cuboid: NT Calcaneous: NT Metatarsals: NT 5th MT: NT Phalanges:  NT Achilles: NT Plantar Fascia: NT Fat Pad: NT Peroneals: NT Post Tib: NT Great Toe: Nml motion Ant Drawer: neg ATFL: NT CFL: NT Deltoid: NT Sensation: intact   Allergies: 1)  ! Augmentin 2)  ! Flexeril 3)  * Ace Inhibitors Group 4)  Ibuprofen (Ibuprofen)  Past History:  Past medical, surgical, family and social histories (including risk factors) reviewed, and no changes noted (except as noted below).  Past Medical History: Reviewed history from 10/02/2009 and no changes required. GERD Hyperlipidemia Hypertension obesity MR hyperglycemia all rhinitis OA in R foot glaucoma chronic low back pain with radiculopathy (deg disk)  podiatry Dr Ralene Cork dentist Dr Ulysees Barns Dr Dione Booze derm-- Dr Terri Piedra Carolyne Littles  Past Surgical History: Reviewed history from 03/23/2008 and no changes required. Hysterectomy arm fx- past cataract surgery   Family History: Reviewed history from 02/08/2009 and no changes required. mother DM- deceased father - deceased  aunt DM- deceased , CVA, pacemaker   Social History: Reviewed history from 08/13/2007 and no changes required. lives alone- with help from neighbors currently- disability for arm fx non smoker no alcohol    Impression & Recommendations:  Problem # 1:  ANKLE PAIN, LEFT (ICD-719.47) X-ray, Ankle: AP, Lateral, and Mortise Views Indication: Ankle pain Findings: There is no evidence for acute fracture or dislocation. The mortise appears preserved.   clinically, the patient has a minimal effusion in the true ankle joint, and pain there on palpation. I suspect there is just a mild degree of osteoarthritic changes, and encourage the  patient to lose weight, exercise,  and if possible  obtain some supportive footwear.   Tried to reassure the patient, at do not think there is any significant bone or joint issue at play.  Recommended Tylenol, relatively  decreased activity, and ice nightly  for the next 1-2  weeks  Orders: T-Ankle Comp Left Min 3 Views (73610TC)  Complete Medication List: 1)  Cartia Xt 240 Mg Cp24 (Diltiazem hcl coated beads) .Marland Kitchen.. 1 by mouth once daily 2)  Lipitor 20 Mg Tabs (Atorvastatin calcium) .... Take one by mouth daily generic please 3)  Omeprazole 20 Mg Cpdr (Omeprazole) .Marland Kitchen.. 1 by mouth every am 4)  Hydrochlorothiazide 12.5 Mg Tabs (Hydrochlorothiazide) .... Take 1 tablet by mouth once a day 5)  Klor-con M20 20 Meq Tbcr (Potassium chloride crys cr) .... Take 1 tablet by mouth once a day 6)  Calcium 500/d 500-200 Mg-unit Tabs (Calcium carbonate-vitamin d) .... Once daily 7)  Clarinex 5 Mg Tabs (Desloratadine) .Marland Kitchen.. 1 by mouth once daily as needed allergies 8)  Aspirin 81 Mg Tabs (Aspirin) .Marland Kitchen.. 1 by mouth once daily with food 9)  Metformin Hcl 500 Mg Tabs (Metformin hcl) .... Take one by mouth two times a day 10)  Metamucil 30.9 % Powd (Psyllium) .... Otc as directed. 11)  Flonase 50 Mcg/act Susp (Fluticasone propionate) .Marland Kitchen.. 1 spray in each nostril once daily for 2 weeks for ear problem  stop it if you get a nosebleed as needed 12)  Probiotic Caps (Probiotic product) .... Take 1 capsule by mouth once a day 13)  Ventolin Hfa 108 (90 Base) Mcg/act Aers (Albuterol sulfate) .... Take 2 puffs by mouth every 4 hours as needed for shortness of breath or cough 14)  Pepto-bismol Max Strength 525 Mg/80ml Susp (Bismuth subsalicylate) .... Otc as directed. 15)  Ketoconazole 2 % Sham (Ketoconazole) .... Use as directed twice weekly as needed  Patient Instructions: 1)  TYLENOL 2 TABLETS THREE TIMES A DAY 2)  ICE AT NIGHT   Orders Added: 1)  T-Ankle Comp Left Min 3 Views [73610TC] 2)  Est. Patient Level III [04540]    Current Allergies (reviewed today): ! AUGMENTIN ! FLEXERIL * ACE INHIBITORS GROUP IBUPROFEN (IBUPROFEN)

## 2010-04-04 LAB — URINALYSIS, ROUTINE W REFLEX MICROSCOPIC
Nitrite: NEGATIVE
Protein, ur: NEGATIVE mg/dL
Specific Gravity, Urine: 1.025 (ref 1.005–1.030)
Urobilinogen, UA: 0.2 mg/dL (ref 0.0–1.0)

## 2010-04-04 LAB — URINE MICROSCOPIC-ADD ON

## 2010-04-05 ENCOUNTER — Ambulatory Visit (INDEPENDENT_AMBULATORY_CARE_PROVIDER_SITE_OTHER): Payer: Medicare Other | Admitting: Family Medicine

## 2010-04-05 ENCOUNTER — Encounter: Payer: Self-pay | Admitting: Family Medicine

## 2010-04-05 DIAGNOSIS — J309 Allergic rhinitis, unspecified: Secondary | ICD-10-CM

## 2010-04-08 LAB — URINALYSIS, ROUTINE W REFLEX MICROSCOPIC
Bilirubin Urine: NEGATIVE
Hgb urine dipstick: NEGATIVE
Ketones, ur: NEGATIVE mg/dL
Nitrite: NEGATIVE
Nitrite: NEGATIVE
Protein, ur: 30 mg/dL — AB
Specific Gravity, Urine: 1.033 — ABNORMAL HIGH (ref 1.005–1.030)
Urobilinogen, UA: 1 mg/dL (ref 0.0–1.0)
pH: 5.5 (ref 5.0–8.0)

## 2010-04-08 LAB — URINE CULTURE

## 2010-04-08 LAB — WET PREP, GENITAL
Trich, Wet Prep: NONE SEEN
WBC, Wet Prep HPF POC: NONE SEEN
Yeast Wet Prep HPF POC: NONE SEEN

## 2010-04-08 LAB — URINE MICROSCOPIC-ADD ON

## 2010-04-09 LAB — URINE MICROSCOPIC-ADD ON

## 2010-04-09 LAB — URINALYSIS, ROUTINE W REFLEX MICROSCOPIC
Bilirubin Urine: NEGATIVE
Nitrite: NEGATIVE
Specific Gravity, Urine: 1.003 — ABNORMAL LOW (ref 1.005–1.030)
Urobilinogen, UA: 0.2 mg/dL (ref 0.0–1.0)
pH: 6 (ref 5.0–8.0)

## 2010-04-09 NOTE — Assessment & Plan Note (Signed)
Summary: SINUS/CLE   Vital Signs:  Patient profile:   55 year old female Weight:      186.75 pounds Temp:     97.8 degrees F oral Pulse rate:   84 / minute Pulse rhythm:   regular BP sitting:   122 / 82  (left arm) Cuff size:   large  Vitals Entered By: Selena Batten Dance CMA Duncan Dull) (April 05, 2010 8:42 AM) CC: Sinuses   History of Present Illness: CC: check sinuses  using fluticasone spray but once had nosebleed.  uses as needed for sinus congestion.  used last night.  A few weeks ago went to drug store, was told $51.  pt in donut hole currently, cannot afford.    Currently sinuses doing allright, coming in today because would like alternative OTC med.  has been using nasal saline spray.  Allergies: 1)  ! Augmentin 2)  ! Flexeril 3)  * Ace Inhibitors Group 4)  Ibuprofen (Ibuprofen)  Physical Exam  General:  overweight but generally well appearing  Head:  normocephalic, atraumatic, and no abnormalities observed.  no sinus tenderness Eyes:  vision grossly intact, pupils equal, and pupils reactive to light.   Ears:  R ear normal and L ear normal.  scant to no cerumen Nose:  congested bilaterally, L>R mucous Mouth:  pharynx pink and moist.   Neck:  supple with full rom and no masses or thyromegally, no JVD or carotid bruit    Impression & Recommendations:  Problem # 1:  ALLERGIC  RHINITIS (ICD-477.9) Assessment Improved reassured.  discussed med use.  may use flonase as needed, hold for nosebleed.  may use nasal saline spray daily.  to check and see if has clarinex at home.  Her updated medication list for this problem includes:    Clarinex 5 Mg Tabs (Desloratadine) .Marland Kitchen... 1 by mouth once daily as needed allergies    Flonase 50 Mcg/act Susp (Fluticasone propionate) .Marland Kitchen... 1 spray in each nostril once daily for 2 weeks for ear problem  stop it if you get a nosebleed as needed  Complete Medication List: 1)  Cartia Xt 240 Mg Cp24 (Diltiazem hcl coated beads) .Marland Kitchen.. 1 by mouth once  daily 2)  Lipitor 20 Mg Tabs (Atorvastatin calcium) .... Take one by mouth daily generic please 3)  Omeprazole 20 Mg Cpdr (Omeprazole) .Marland Kitchen.. 1 by mouth every am 4)  Hydrochlorothiazide 12.5 Mg Tabs (Hydrochlorothiazide) .... Take 1 tablet by mouth once a day 5)  Klor-con M20 20 Meq Tbcr (Potassium chloride crys cr) .... Take 1 tablet by mouth once a day 6)  Calcium 500/d 500-200 Mg-unit Tabs (Calcium carbonate-vitamin d) .... Once daily 7)  Clarinex 5 Mg Tabs (Desloratadine) .Marland Kitchen.. 1 by mouth once daily as needed allergies 8)  Aspirin 81 Mg Tabs (Aspirin) .Marland Kitchen.. 1 by mouth once daily with food 9)  Metformin Hcl 500 Mg Tabs (Metformin hcl) .... Take one by mouth two times a day 10)  Metamucil 30.9 % Powd (Psyllium) .... Otc as directed. 11)  Flonase 50 Mcg/act Susp (Fluticasone propionate) .Marland Kitchen.. 1 spray in each nostril once daily for 2 weeks for ear problem  stop it if you get a nosebleed as needed 12)  Probiotic Caps (Probiotic product) .... Take 1 capsule by mouth once a day 13)  Ventolin Hfa 108 (90 Base) Mcg/act Aers (Albuterol sulfate) .... Take 2 puffs by mouth every 4 hours as needed for shortness of breath or cough 14)  Pepto-bismol Max Strength 525 Mg/77ml Susp (Bismuth subsalicylate) .Marland KitchenMarland KitchenMarland Kitchen  Otc as directed. 15)  Ketoconazole 2 % Sham (Ketoconazole) .... Use as directed twice weekly as needed  Patient Instructions: 1)  I think it is ok to use fluticasone (expensive spray) as needed for congestion. 2)  Use the saline nasal spray daily as needed.  this one will help drainage of sinuses as well as you can use it if you have a nosebleed. 3)  Ensure you are getting plenty of water to drink.   Orders Added: 1)  Est. Patient Level II [16109]    Current Allergies (reviewed today): ! AUGMENTIN ! FLEXERIL * ACE INHIBITORS GROUP IBUPROFEN (IBUPROFEN)

## 2010-04-12 LAB — POCT CARDIAC MARKERS
CKMB, poc: <1 ng/mL — ABNORMAL LOW (ref 1.0–8.0)
CKMB, poc: <1 ng/mL — ABNORMAL LOW (ref 1.0–8.0)
Troponin i, poc: <0.05 ng/mL (ref 0.00–0.09)

## 2010-04-12 LAB — DIFFERENTIAL
Basophils Absolute: 0.1 10*3/uL (ref 0.0–0.1)
Basophils Relative: 1 % (ref 0–1)
Lymphocytes Relative: 15 % (ref 12–46)
Monocytes Absolute: 0.7 10*3/uL (ref 0.1–1.0)
Neutro Abs: 8.7 10*3/uL — ABNORMAL HIGH (ref 1.7–7.7)
Neutrophils Relative %: 78 % — ABNORMAL HIGH (ref 43–77)

## 2010-04-12 LAB — CBC
Hemoglobin: 13.6 g/dL (ref 12.0–15.0)
MCHC: 31.6 g/dL (ref 30.0–36.0)
Platelets: 382 10*3/uL (ref 150–400)
RDW: 16.6 % — ABNORMAL HIGH (ref 11.5–15.5)

## 2010-04-12 LAB — POCT I-STAT, CHEM 8
BUN: 12 mg/dL (ref 6–23)
Calcium, Ion: 1.06 mmol/L — ABNORMAL LOW (ref 1.12–1.32)
Chloride: 109 mEq/L (ref 96–112)
HCT: 43 % (ref 36.0–46.0)
Potassium: 4.1 mEq/L (ref 3.5–5.1)
Sodium: 141 mEq/L (ref 135–145)

## 2010-04-12 LAB — GLUCOSE, CAPILLARY: Glucose-Capillary: 150 mg/dL — ABNORMAL HIGH (ref 70–99)

## 2010-04-18 ENCOUNTER — Ambulatory Visit (INDEPENDENT_AMBULATORY_CARE_PROVIDER_SITE_OTHER): Payer: Medicare Other | Admitting: Family Medicine

## 2010-04-18 ENCOUNTER — Encounter: Payer: Self-pay | Admitting: Family Medicine

## 2010-04-18 ENCOUNTER — Telehealth: Payer: Self-pay | Admitting: *Deleted

## 2010-04-18 VITALS — BP 116/78 | HR 92 | Temp 97.9°F | Ht 62.0 in | Wt 183.8 lb

## 2010-04-18 DIAGNOSIS — R109 Unspecified abdominal pain: Secondary | ICD-10-CM

## 2010-04-18 DIAGNOSIS — E119 Type 2 diabetes mellitus without complications: Secondary | ICD-10-CM

## 2010-04-18 DIAGNOSIS — R1030 Lower abdominal pain, unspecified: Secondary | ICD-10-CM | POA: Insufficient documentation

## 2010-04-18 LAB — GLUCOSE, POCT (MANUAL RESULT ENTRY): POC Glucose: 97

## 2010-04-18 LAB — POCT URINALYSIS DIPSTICK
Bilirubin, UA: NEGATIVE
Blood, UA: NEGATIVE
Glucose, UA: NEGATIVE
Nitrite, UA: NEGATIVE
Urobilinogen, UA: 0.2
pH, UA: 6

## 2010-04-18 NOTE — Patient Instructions (Signed)
Things are looking ok today. Urine is looking ok, however I have sent culture to be sure no infection.   We should get results by next week. If fevers or worsening pain, please let us know. Good to see you today.

## 2010-04-18 NOTE — Telephone Encounter (Signed)
She has appt with Dr Reece Agar today

## 2010-04-18 NOTE — Progress Notes (Signed)
  Subjective:    Patient ID: Linda Thompson, female    DOB: 06-17-1955, 55 y.o.   MRN: [id number]  HPI CC: abd pain  Called RN on call last night.  Told to call today to make appointment.  Felt good yesterday.  Then had lower abd pain characterized as discomfort.  Lasting off and on.  Has been drinking diet ginger ale and peptobismol.  No dysuria, urgency, frequency, fevers, nausea/vomiting, other pain, diarrhea, constipation, blood in stool or urine.  Nl BMs.  Sometimes having accidents prior to making it to bathroom.  No CP, SOB.  Appetite ok.    No pain currently.  Improved since last night.  No new medicines, no new foods recently.  This past Tuesday had back pain.  Saw Dr. Ophelia Charter ortho doctor recently.  Told has touch of arthritis in lower back.  Taking tylenol.  Recommended walking as well which she has been doing.  H/o IBS, states usually quiet though.  Reports compliance with meds.  Lost 3 lbs because has been walking more.  Medications and allergies reviewed and updated as above. PMHx reviewed.  Review of Systems Per HPI    Objective:   Physical Exam  Constitutional: She appears well-developed and well-nourished. No distress.  HENT:  Head: Normocephalic and atraumatic.  Mouth/Throat: Oropharynx is clear and moist. No oropharyngeal exudate.  Eyes: Conjunctivae and EOM are normal. Pupils are equal, round, and reactive to light. No scleral icterus.  Neck: Normal range of motion. Neck supple.  Cardiovascular: Normal rate, regular rhythm, normal heart sounds and intact distal pulses.   No murmur heard. Pulmonary/Chest: Effort normal and breath sounds normal. No respiratory distress. She has no wheezes. She has no rales.  Abdominal: Soft. Bowel sounds are normal. She exhibits no distension and no mass. There is tenderness (mild tenderness to deep palpation BLQ R>L). There is no rebound and no guarding.  Skin: Skin is warm and dry. No rash noted.          Assessment &  Plan:

## 2010-04-18 NOTE — Telephone Encounter (Signed)
Triage Record Num: [id number] Operator: Martie Lee Long Patient Name: Linda Thompson Call Date & Time: 04/17/2010 5:50:53PM Patient Phone: [phone] PCP: Audrie Gallus. Tower Patient Gender: Female PCP Fax : Patient DOB: [birth year] Practice Name: [facility] Reason for Call: Angely/Patient calling about mild abdominal pain. Onset 04/17/10. Afebrile. Normal Bowel Movements. Instructed to see Provider within 72 hrs. Protocol(s) Used: Diabetes: Gastrointestinal Problems Recommended Outcome per Protocol: See Provider within 72 Hours Reason for Outcome: All other situations Care Advice: ~ SYMPTOM / CONDITION MANAGEMENT 03/

## 2010-04-18 NOTE — Assessment & Plan Note (Addendum)
Anticipate benign etiology esp given pain has resolved.  ? Bowel cramping/spasm in h/o IBS. Some urinary accidents recently, although no other sxs of UTI.  Checked UA - micro more consistent with contamination.  UCx sent regardless. Reassured today. cbg 97 today. Await UCx.  If positive, treat accordingly.  O/w encourage push fluids and rest over weekend.  Update [place] if fever or worsening.

## 2010-04-20 LAB — URINE CULTURE: Colony Count: >=100000

## 2010-04-23 NOTE — Progress Notes (Signed)
Patient notified and says she is feeling fine. No abd pain, fevers, burning with urination or frequency. She will come back for recheck if symptoms return.

## 2010-04-25 ENCOUNTER — Telehealth: Payer: Self-pay

## 2010-04-25 NOTE — Telephone Encounter (Signed)
Pt request samples of Lipitor 20mg  due to cost of med. Lipitor 20mg  #56 samples given Lot #[id number] exp date:02/2011. Pt is aware to take 1 tablet by mouth daily.

## 2010-05-01 LAB — BASIC METABOLIC PANEL
BUN: 14 mg/dL (ref 6–23)
CO2: 26 mEq/L (ref 19–32)
Chloride: 104 mEq/L (ref 96–112)
Glucose, Bld: 178 mg/dL — ABNORMAL HIGH (ref 70–99)
Potassium: 4.3 mEq/L (ref 3.5–5.1)

## 2010-05-01 LAB — URINALYSIS, ROUTINE W REFLEX MICROSCOPIC
Bilirubin Urine: NEGATIVE
Glucose, UA: NEGATIVE mg/dL
Hgb urine dipstick: NEGATIVE
Ketones, ur: NEGATIVE mg/dL
pH: 6.5 (ref 5.0–8.0)

## 2010-05-01 LAB — CBC
HCT: 42.9 % (ref 36.0–46.0)
MCV: 85.2 fL (ref 78.0–100.0)
Platelets: 339 10*3/uL (ref 150–400)
RDW: 14.7 % (ref 11.5–15.5)

## 2010-05-01 LAB — DIFFERENTIAL
Basophils Absolute: 0 10*3/uL (ref 0.0–0.1)
Eosinophils Absolute: 0.1 10*3/uL (ref 0.0–0.7)
Eosinophils Relative: 1 % (ref 0–5)
Monocytes Absolute: 0.5 10*3/uL (ref 0.1–1.0)

## 2010-05-02 LAB — HM MAMMOGRAPHY: HM Mammogram: NEGATIVE

## 2010-05-06 ENCOUNTER — Other Ambulatory Visit: Payer: Self-pay | Admitting: Family Medicine

## 2010-05-17 ENCOUNTER — Ambulatory Visit (INDEPENDENT_AMBULATORY_CARE_PROVIDER_SITE_OTHER): Payer: Medicare Other | Admitting: Family Medicine

## 2010-05-17 ENCOUNTER — Encounter: Payer: Self-pay | Admitting: Family Medicine

## 2010-05-17 VITALS — BP 118/80 | HR 90 | Temp 98.4°F | Ht 62.0 in | Wt 182.0 lb

## 2010-05-17 DIAGNOSIS — J029 Acute pharyngitis, unspecified: Secondary | ICD-10-CM | POA: Insufficient documentation

## 2010-05-17 NOTE — Patient Instructions (Signed)
I would gargle with salt water several times a day.  This should gradually get better.  Take care.

## 2010-05-17 NOTE — Assessment & Plan Note (Signed)
RST neg.  LIkely viral and/or allergy sx.  I would gargle with salt water.  This should gradually get better.  Nontoxic.  Fu prn.

## 2010-05-17 NOTE — Progress Notes (Signed)
duration of symptoms: a few days Rhinorrhea: no congestion:no ear pain:no sore throat: yes Cough: 'a little' myalgias:no Gargling with salt water, some relief.  She thought she had a fever yesterday.  Some throat clearing. No dec in appetite.  Compliant with regular meds.    ROS: See HPI.  Otherwise negative.    Meds, vitals, and allergies reviewed.   GEN: nad, alert and oriented HEENT: mucous membranes moist, TM w/o erythema, nasal epithelium not injected, OP with minimal cobblestoning but no exudates NECK: supple w/o LA CV: rrr. PULM: ctab, no inc wob ABD: soft, +bs EXT: no edema

## 2010-05-18 ENCOUNTER — Inpatient Hospital Stay (INDEPENDENT_AMBULATORY_CARE_PROVIDER_SITE_OTHER)
Admission: RE | Admit: 2010-05-18 | Discharge: 2010-05-18 | Disposition: A | Discharge status: Home / Self Care | Payer: Medicare Other | Source: Ambulatory Visit | Attending: Emergency Medicine | Admitting: Emergency Medicine

## 2010-05-18 DIAGNOSIS — L299 Pruritus, unspecified: Secondary | ICD-10-CM

## 2010-05-23 ENCOUNTER — Encounter: Payer: Self-pay | Admitting: Family Medicine

## 2010-05-23 ENCOUNTER — Ambulatory Visit (INDEPENDENT_AMBULATORY_CARE_PROVIDER_SITE_OTHER): Payer: Medicare Other | Admitting: Family Medicine

## 2010-05-23 VITALS — BP 122/72 | HR 80 | Temp 97.6°F | Wt 184.0 lb

## 2010-05-23 DIAGNOSIS — M6281 Muscle weakness (generalized): Secondary | ICD-10-CM

## 2010-05-23 DIAGNOSIS — R29898 Other symptoms and signs involving the musculoskeletal system: Secondary | ICD-10-CM | POA: Insufficient documentation

## 2010-05-23 DIAGNOSIS — R630 Anorexia: Secondary | ICD-10-CM | POA: Insufficient documentation

## 2010-05-23 NOTE — Assessment & Plan Note (Signed)
No evidence of unilateral leg weakness on exam today.  Advised continue to monitor, if worsening or red flags, return or call ortho doc.

## 2010-05-23 NOTE — Patient Instructions (Signed)
Appetite - may be something you ate that didn't agree with you.  Doesn't sound like stomach virus.  Continue to eat small portions at a time until you start feeling better. We will keep eye on left leg.  If worsening weakness or fevers or bowel/bladder accidents, let us know. Good to see you today, call us with questions.

## 2010-05-23 NOTE — Progress Notes (Signed)
  Subjective:    Patient ID: Linda Thompson, female    DOB: 1956/01/17, 55 y.o.   MRN: [id number]  HPI CC: no appetite.  Thought was getting stomach virus - took pepto bismol and drinking diet ginger ale.  Neighbor with stomach virus.  Has been eating ok but feels like doesn't have appetite.  Does eat breakfast, lunch and dinner (chicken pot pie) and take her medicines ok.  Decreased appetite started 2 days ago.  No weight loss.  Also eating crackers.  Also feels like L leg is getting weak.  Did have xray of L leg recently, told had arthritis and to take tylenol.  Takes her time walking.  Weakness worse when laying down.  No change in back pain.  No leg pain.  Has been told has arthritis in back.  Saw Dr. Ophelia Charter in past.  Tries to get out and walk daily.  No HA.  Normal BM and voiding.  No constipation or diarrhea.  No nausea/vomiting.  No recent weight changes (thinks may have lost weight).  No fevers/chills, sometimes belly pain (at baseline).  No numbness in leg.  No night sweats.  Last BM last night, normal.  Wt Readings from Last 3 Encounters:  05/23/10 184 lb (83.462 kg)  05/17/10 182 lb (82.555 kg)  04/18/10 183 lb 12 oz (83.348 kg)   Review of Systems Per PHI    Objective:   Physical Exam  Constitutional: She appears well-developed and well-nourished. No distress.  HENT:  Head: Normocephalic and atraumatic.  Mouth/Throat: Oropharynx is clear and moist. No oropharyngeal exudate.  Eyes: Conjunctivae are normal. Pupils are equal, round, and reactive to light. No scleral icterus.  Neck: Normal range of motion. Neck supple.  Cardiovascular: Normal rate, regular rhythm, normal heart sounds and intact distal pulses.   No murmur heard. Pulmonary/Chest: Effort normal and breath sounds normal. No respiratory distress. She has no wheezes. She has no rales.  Abdominal: Soft. Bowel sounds are normal. She exhibits no distension and no mass. There is no tenderness. There is no rebound and no  guarding.  Musculoskeletal: Normal range of motion.  Neurological: She is alert.       Diminished DTRs bilaterally Slightly diminished strength BLE (4+/5) but symmetrical and attributed to deconditioning.  Able to heel and toe walk.  Skin: Skin is warm and dry. No rash noted.  Psychiatric: She has a normal mood and affect.          Assessment & Plan:

## 2010-05-23 NOTE — Assessment & Plan Note (Signed)
Exam benign today, no weight changes.   Possibly something she ate didn't agree with her. Continue to monitor for now, ok to go to weekend lunch at church. Not viral gastro currently

## 2010-05-27 ENCOUNTER — Telehealth: Payer: Self-pay | Admitting: *Deleted

## 2010-05-27 MED ORDER — FLUCONAZOLE 150 MG PO TABS: 150.0000 mg | ORAL_TABLET | Freq: Once | ORAL | Status: AC

## 2010-05-27 NOTE — Telephone Encounter (Signed)
Patient notified as instructed by telephone.Medication phoned to [facility] pharmacy as instructed.  

## 2010-05-27 NOTE — Telephone Encounter (Signed)
Patient says that she has a yeast infection and is asking if she can get diflucan called in to [facility].

## 2010-05-27 NOTE — Telephone Encounter (Signed)
Px written for call in  Follow up if not improved  Tell her to hold her lipitor for 3 days after she takes the diflucan-thanks

## 2010-05-29 ENCOUNTER — Ambulatory Visit (INDEPENDENT_AMBULATORY_CARE_PROVIDER_SITE_OTHER): Payer: Medicare Other | Admitting: Family Medicine

## 2010-05-29 ENCOUNTER — Encounter: Payer: Self-pay | Admitting: Family Medicine

## 2010-05-29 VITALS — BP 104/78 | HR 80 | Temp 97.4°F | Wt 183.0 lb

## 2010-05-29 DIAGNOSIS — J029 Acute pharyngitis, unspecified: Secondary | ICD-10-CM

## 2010-05-29 DIAGNOSIS — J309 Allergic rhinitis, unspecified: Secondary | ICD-10-CM

## 2010-05-29 LAB — POCT RAPID STREP A (OFFICE): Rapid Strep A Screen: NEGATIVE

## 2010-05-29 NOTE — Progress Notes (Signed)
GI symptoms got better in meantime since last OV.  ST prev got better and then came back over last few days.  Back gargling with salt water.  Postnasal gtt and itchy eyes.   Rhinorrhea: no Congestion: "a little" ear pain:no sore throat: mild Cough:occ throat clearing myalgias:no other concerns:No change in vision, RST negative.  No fevers.   Had taken flonase but had nosebleed.   Using neti pot once daily.  ROS: See HPI.  Otherwise negative.    Meds, vitals, and allergies reviewed.   GEN: nad, alert and oriented HEENT: mucous membranes moist, TM w/o erythema, nasal epithelium injected, OP with cobblestoning-mild, B mild allergic shiners NECK: supple w/o LA CV: rrr. PULM: ctab, no inc wob EXT: no edema

## 2010-05-29 NOTE — Assessment & Plan Note (Addendum)
Inc neti pot and then fu prn.  Benign exam o/w.  Fu prn.  RST negative.

## 2010-05-29 NOTE — Patient Instructions (Signed)
Keep taking the clarinex and use the nasal saline 3 or 4 times a day.  This should gradually get better.  Take care.

## 2010-06-03 ENCOUNTER — Telehealth: Payer: Self-pay | Admitting: *Deleted

## 2010-06-03 ENCOUNTER — Encounter: Payer: Self-pay | Admitting: Family Medicine

## 2010-06-03 ENCOUNTER — Ambulatory Visit (INDEPENDENT_AMBULATORY_CARE_PROVIDER_SITE_OTHER): Payer: Medicare Other | Admitting: Family Medicine

## 2010-06-03 VITALS — BP 132/80 | HR 86 | Temp 98.1°F | Wt 183.1 lb

## 2010-06-03 DIAGNOSIS — R197 Diarrhea, unspecified: Secondary | ICD-10-CM | POA: Insufficient documentation

## 2010-06-03 NOTE — Progress Notes (Signed)
  Subjective:    Patient ID: Linda Thompson, female    DOB: December 08, 1955, 55 y.o.   MRN: [id number]  HPI CC: ? Stomach virus  Over weekend started with diarrhea, prior to this was eating well, also taking pepto bismol.  Told by pharmacist to stop pepto bismol and if not feeling better, to call us for f/u.  Thinks has been eating and drinking well.  Today feeling better.  Having BM 1-2 x/day.  Today better.  Comes in to get checked out.  No fevers/chills, abd pain, n/v.  H/o irritable bowel.  Review of Systems Per HPI    Objective:   Physical Exam  Vitals reviewed. Constitutional: She appears well-developed and well-nourished. No distress.  HENT:  Mouth/Throat: Oropharynx is clear and moist. No oropharyngeal exudate.  Eyes: Conjunctivae and EOM are normal. Pupils are equal, round, and reactive to light. No scleral icterus.  Cardiovascular: Normal rate, regular rhythm, normal heart sounds and intact distal pulses.   No murmur heard. Pulmonary/Chest: Effort normal and breath sounds normal. No respiratory distress. She has no wheezes. She has no rales.  Abdominal: Soft. She exhibits no distension and no mass. Bowel sounds are increased. There is no tenderness. There is no rebound and no guarding.  Skin: Skin is warm and dry. No rash noted.          Assessment & Plan:

## 2010-06-03 NOTE — Telephone Encounter (Signed)
[facility] Triage Call Report Triage Record Num: [id number] Operator: Caswell Corwin Patient Name: Linda Thompson Call Date & Time: 06/01/2010 4:51:40PM Patient Phone: [phone] PCP: Audrie Gallus. Tower Patient Gender: Female PCP Fax : Patient DOB: 1956-01-11 Practice Name: [facility] Reason for Call: Pt calling that she has diarrhea that started 05/31/10 PM. No vomiting or fever. Does not have a glucometer. Has had diarrhea x 3 today. Last voided at 1500. Triaged DM: Gastrointestional Problems and all ? neg. Home care and call back inst given. Inst to call ofc 06/03/10. Protocol(s) Used: Diabetes: Gastrointestinal Problems Recommended Outcome per Protocol: See Provider within 72 Hours Reason for Outcome: All other situations Care Advice: Diarrheal Care: - Drink 2-3 quarts (2-3 liters) per day of low sugar content fluids, including over the counter oral hydration solution, unless directed otherwise by provider. - If accompanied by vomiting, take the fluids in frequent small sips or suck on ice chips. - Eat easily digested foods (such as bananas, rice, applesauce, toast, cooked cereals, soup, crackers, baked or boiled potato, or baked chicken or [place] without skin). - Do not eat high fiber, high fat, high sugar content foods, or highly seasoned foods. - Do not drink caffeinated or alcoholic beverages. - Avoid milk and milk products while having symptoms. As symptoms improve, gradually add back to diet. - Application of A&D ointment or witch hazel medicated pads may help anal irritation. - Antidiarrheal medications are usually unnecessary. If symptoms are severe, consider nonprescription antidiarrheal and anti-motility drugs as directed by label or a provider. Do not take if have high fever or bloody diarrhea. If pregnant, do not take any medications not approved by your provider. - Consult your provider for advice regarding continuing prescription medication. ~ Blood Sugar and  Ketone Testing: - Check blood sugar as recommended, and more often whenever it has been high or low, any change to the treatment plan, increased stress, illness, infection, or a change to the regular schedule. - Test for urine ketones anytime the blood sugar is above 250 mg/dl, when there is stress, acute illness, nausea, vomiting, or abdominal pain. - Follow action plan for illness. - Record blood sugar and ketones in meter log or separate logbook, and take to all provider visits. - Drink extra water and other non-sugar fluids to prevent dehydration when the blood sugar is high and urine ketones are present. ~ Diabetes Action Plan: - Follow recommended action plan and diet - Check blood sugar as recommended - Take diabetic pills or insulin as ordered - Follow action plan for illness. ~ 06/01/2010 5:05:23PM Page 1 of 1 [email or web address]

## 2010-06-03 NOTE — Patient Instructions (Signed)
I'm glad you're feeling better. Update [place] if any questions. This could be flare of irritable bowel. I don't think this was a stomach virus.

## 2010-06-03 NOTE — Assessment & Plan Note (Signed)
Mild, self-limiting. Resolved. In setting of h/o IBS, could be flare.   Doubt stomach virus as no n/v, fevers. Supportive care, update [place] if not improving.

## 2010-06-07 NOTE — Op Note (Signed)
[place]  Patient:    Linda Thompson, Linda Thompson Visit Number: [id number] MRN: [id number]          Service Type: SUR Location: [phone] Attending Physician:  Jacki Cones Dictated by:   Veverly Fells Ophelia Charter, M.D. Proc. Date: 03/24/01 Admit Date:  03/24/2001 Discharge Date: 03/25/2001                             Operative Report  PREOPERATIVE DIAGNOSIS:  Right radial head fracture.  POSTOPERATIVE DIAGNOSIS:  Right radial head fracture.  OPERATION PERFORMED:  Open reduction internal fixation of right radial head fracture.  SURGEON:  Mark C. Ophelia Charter, M.D.  ASSISTANT:  Zonia Kief, PA-C  ANESTHESIA:  General.  TOURNIQUET TIME:  31 minutes.  DESCRIPTION OF PROCEDURE:  After induction of general anesthesia, preoperative Ancef prophylaxis, the right upper extremity was prepped with DuraPrep.  Usual impervious stockinet, Coban, sterile skin marker, Betadine, Vi-drape was applied sealing the skin.  Arm was wrapped with an Esmarch prior to tourniquet inflation.  Incision was made starting at the lateral epicondyle, splitting the extensor mass down through the synovium to visualize the radial head. With the forearm in neutral position, the fracture fragment was not visualized.  As the arm was supinated, the anterior portion of the radial head with the forearm supinated showed two pieces involving between a third but actually closer to half of the radial head.  These pieces were depressed with the articular surface 7 to 8 mm down.  Using a dental pick, pieces were wiggled and pulled up and then the bone impactor was used to impact from the outer cortex toward the middle of the radial head which secured the pieces back to anatomic position.  The bone was compacted and soft and it was felt best to hold this with some K-wires.  0.045 K-wire was used times three passing it through and stopping just through the opposite cortex.  The radial head was rotated  back and forth.  The pin was slightly prominent.  It was pulled through the opposite side slightly, cut off and then impacted back into the bone so that it would catch both cortices but was not protruding and would not rub.  Pins were not placed directly up on the articular surface of the radial head so as not to articulate with the ulna.  Fluoroscopy was used to check position.  AP lateral and also radial head views, spot films were taken. There was good reduction.  After irrigation with saline solution, synovium extensor mass was closed with 2-0 Vicryl.  4-0 Vicryl were used in the subcutaneous tissues.  Marcaine infiltration of the skin.  Skin staple closure.  Xeroform, 4 x 4s and a long arm splint with forearm in neutral position, wrist in neutral and elbow at 90 degrees. Dictated by:   Veverly Fells Ophelia Charter, M.D. Attending Physician:  Jacki Cones DD:  03/24/01 TD:  03/25/01 Job: [id number]

## 2010-06-11 ENCOUNTER — Encounter: Payer: Self-pay | Admitting: Family Medicine

## 2010-06-11 ENCOUNTER — Ambulatory Visit (INDEPENDENT_AMBULATORY_CARE_PROVIDER_SITE_OTHER): Payer: Medicare Other | Admitting: Family Medicine

## 2010-06-11 ENCOUNTER — Telehealth: Payer: Self-pay | Admitting: *Deleted

## 2010-06-11 DIAGNOSIS — R159 Full incontinence of feces: Secondary | ICD-10-CM

## 2010-06-11 NOTE — Telephone Encounter (Signed)
Go ahead and give her some -- if she has to split any pills -- please explain well and write inst for her thanks

## 2010-06-11 NOTE — Assessment & Plan Note (Signed)
x2 over weekend, since no more episodes, normal stools. Could be just another IBS flare with urgency and inability to get to bathroom on time, so did not do rectal exam today. However the incontinence seems to be a new symptom. As seems to have resolved, have asked her to monitor things for now.   If any other bowel incontinence episodes, have asked pt to return for rectal exam and for further evaluation. I don't see mention of colonoscopy in chart, will check with PCP to see if this has been done in past.

## 2010-06-11 NOTE — Progress Notes (Signed)
  Subjective:    Patient ID: Linda Thompson, female    DOB: 04-Feb-1955, 55 y.o.   MRN: [id number]  HPI CC: "i think it's my hernia."  Seen last week with dx IBS flare with mild diarrhea, resolved.  Went out to [employer] over weekend and had bacon.  Over weekend had diarrhea x 2.  Uas unable to get to bathroom in time and had bowel accident x 2.  Since then wearing depends out of concern for rpt accident but has had normal stools x 2 days.  No other accidents since weekend.  No fever/chills, nausea/vomiting, constipation, abd pain, appetite ok.  Thinks had colonoscopy about 10 years ago, but doesn't know for sure, no mention in records.  Review of Systems Per HPI    Objective:   Physical Exam  Nursing note and vitals reviewed. Constitutional: She appears well-developed and well-nourished. No distress.  HENT:  Head: Normocephalic and atraumatic.  Mouth/Throat: Oropharynx is clear and moist. No oropharyngeal exudate.  Eyes: Conjunctivae and EOM are normal. Pupils are equal, round, and reactive to light. No scleral icterus.  Neck: Normal range of motion. Neck supple.  Cardiovascular: Normal rate, regular rhythm, normal heart sounds and intact distal pulses.   No murmur heard. Pulmonary/Chest: Effort normal and breath sounds normal. No respiratory distress. She has no wheezes. She has no rales.  Abdominal: Soft. Bowel sounds are normal. She exhibits no distension and no mass. There is no tenderness. There is no rebound and no guarding. Hernia confirmed negative in the right inguinal area and confirmed negative in the left inguinal area.          Assessment & Plan:

## 2010-06-11 NOTE — Telephone Encounter (Signed)
Pt is almost out of lipitor and cant afford to buy any.  She says it will cost her $118.00, she's not sure if she takes generic.  We have some samples, ok to give her some?

## 2010-06-11 NOTE — Patient Instructions (Signed)
We will watch for now.  This could just have been episode of bad diarrhea. I don't think this is from hernia. If any more accidents, return to see [place] again. Good to see you today, call us with questions.

## 2010-06-12 NOTE — Telephone Encounter (Signed)
Pt came by and picked up Lipitor 20mg  samples #56 Lot #[id number] and Exp date 02/2011. Pt will continue taking one daily.

## 2010-06-14 ENCOUNTER — Encounter: Payer: Self-pay | Admitting: Family Medicine

## 2010-06-14 ENCOUNTER — Ambulatory Visit (INDEPENDENT_AMBULATORY_CARE_PROVIDER_SITE_OTHER): Payer: Medicare Other | Admitting: Family Medicine

## 2010-06-14 DIAGNOSIS — E119 Type 2 diabetes mellitus without complications: Secondary | ICD-10-CM

## 2010-06-14 DIAGNOSIS — R197 Diarrhea, unspecified: Secondary | ICD-10-CM

## 2010-06-14 NOTE — Assessment & Plan Note (Signed)
Will hold metformin until next visit to see if bowel changes resolve Also wonder if shaking episode ws hypoglycemia  Pt not good canditate to check sugar at home due to MR  F/u 3-4 wk Rev low glycemic diet

## 2010-06-14 NOTE — Progress Notes (Signed)
  Subjective:    Patient ID: Linda Thompson, female    DOB: 1956-01-11, 55 y.o.   MRN: [id number]  HPI Is here for f/u of diarrhea Saw Dr Reece Agar after bad episode-- had eaten bacon  Now is trying to eat the lower fat variety (also eats it with grits) Have suspected ibs in past  Has had colonosc in past Wt is stable   Is having frequent bms and occ looses continence   Now having more frequent bms 2-3 times per day  Does not eat any more bacon - and her bms are normal  Takes metamucil and probiotic caps daily  No abd pain or other symptoms   Also occ has bladder leakage  Back does not bother her at all    On 5/24 had episode of shaking all over when lying down -- felt shaky all over Happened once before - lasts few minutes No hx of seizures  ? If was time to eat - does not skip meals    Review of Systems Review of Systems  Constitutional: Negative for fever, appetite change, fatigue and unexpected weight change.  Eyes: Negative for pain and visual disturbance.  Respiratory: Negative for cough and shortness of breath.   Cardiovascular: Negative for cp or sob or edema .   Gastrointestinal: Negative for nausea, and constipation. no abd pain or cramping Genitourinary: Negative for urgency and frequency.  Skin: Negative for pallor. or rash  Neurological: Negative for weakness, light-headedness, numbness and headaches.  Hematological: Negative for adenopathy. Does not bruise/bleed easily.  Psychiatric/Behavioral: Negative for dysphoric mood. The patient is not nervous/anxious.          Objective:   Physical Exam  Constitutional: She appears well-developed and well-nourished. No distress.       overwt and well appearing   HENT:  Head: Normocephalic and atraumatic.  Mouth/Throat: Oropharynx is clear and moist.  Eyes: Conjunctivae and EOM are normal. Pupils are equal, round, and reactive to light.  Neck: Normal range of motion. Neck supple. No JVD present. No thyromegaly present.   Cardiovascular: Normal rate, regular rhythm and normal heart sounds.   Pulmonary/Chest: Effort normal and breath sounds normal. No respiratory distress. She has no wheezes.  Abdominal: Soft. Bowel sounds are normal. She exhibits no distension and no mass. There is no tenderness. There is no rebound and no guarding.  Musculoskeletal: Normal range of motion. She exhibits no edema and no tenderness.  Lymphadenopathy:    She has no cervical adenopathy.  Neurological: She is alert. She has normal strength. She displays normal reflexes. No cranial nerve deficit or sensory deficit. She displays a negative Romberg sign. Coordination and gait normal.  Skin: Skin is warm and dry. No rash noted. No erythema. No pallor.  Psychiatric: She has a normal mood and affect.          Assessment & Plan:

## 2010-06-14 NOTE — Patient Instructions (Addendum)
Follow low sugar diet -- for your diabetes  Hold your metformin (sugar pill) until you see me next -- I wonder if this could worsen diarrhea for you  Continue your other medicines without change Avoid bacon and other fatty foods  Follow up with me in 3-4 weeks

## 2010-06-14 NOTE — Assessment & Plan Note (Signed)
On and off episodes of loose/ frequent stools -- poss rel to fatty foods  Had long disc about that  ? If happening more with metformin - so will hold it for now  F/u 3-4 wk Do not think  Pt would be appropriate for at home sugar testing so tx is problematic

## 2010-06-20 ENCOUNTER — Encounter: Payer: Self-pay | Admitting: Family Medicine

## 2010-06-20 ENCOUNTER — Ambulatory Visit (INDEPENDENT_AMBULATORY_CARE_PROVIDER_SITE_OTHER): Payer: Medicare Other | Admitting: Family Medicine

## 2010-06-20 VITALS — BP 124/82 | HR 76 | Temp 97.6°F | Wt 184.1 lb

## 2010-06-20 DIAGNOSIS — J3489 Other specified disorders of nose and nasal sinuses: Secondary | ICD-10-CM

## 2010-06-20 DIAGNOSIS — R0981 Nasal congestion: Secondary | ICD-10-CM | POA: Insufficient documentation

## 2010-06-20 NOTE — Patient Instructions (Signed)
I think you have sinus congestion. Use simple mucinex (no mucinex D) as well as nasal saline irrigation (both over the counter). If any fever >101 or worsening, let us know.

## 2010-06-20 NOTE — Assessment & Plan Note (Signed)
2d hx, currently improving. Treat with mucinex and nasal saline. Update if fever or worsening. Doubt will need abx, no current sinus infection.

## 2010-06-20 NOTE — Progress Notes (Signed)
  Subjective:    Patient ID: Linda Thompson, female    DOB: 1956-01-13, 55 y.o.   MRN: [id number]  HPI CC: ear/cheek on R  2 nights ago felt "funny" R ear and cheek.  + congestion.  No pain.  Sometimes sneezing, sometimes blowing nose with tissue, has clear mucous come out.  Mild cough.  Today feeling better.  No fevers/chills, HA, ST.  No ear pain or tooth pain.  Review of Systems Per HPI    Objective:   Physical Exam  Nursing note and vitals reviewed. Constitutional: She appears well-developed and well-nourished. No distress.  HENT:  Head: Normocephalic and atraumatic.  Right Ear: Hearing, tympanic membrane, external ear and ear canal normal.  Left Ear: Hearing, tympanic membrane, external ear and ear canal normal.  Nose: No mucosal edema or rhinorrhea. Right sinus exhibits no maxillary sinus tenderness and no frontal sinus tenderness. Left sinus exhibits no maxillary sinus tenderness and no frontal sinus tenderness.  Mouth/Throat: Uvula is midline, oropharynx is clear and moist and mucous membranes are normal. No oropharyngeal exudate.  Eyes: Conjunctivae and EOM are normal. Pupils are equal, round, and reactive to light. No scleral icterus.  Neck: Normal range of motion. Neck supple. No JVD present. No thyromegaly present.  Cardiovascular: Normal rate, regular rhythm, normal heart sounds and intact distal pulses.   No murmur heard. Pulmonary/Chest: Effort normal and breath sounds normal. No respiratory distress. She has no wheezes. She has no rales.  Lymphadenopathy:    She has no cervical adenopathy.  Skin: Skin is warm and dry. No rash noted.          Assessment & Plan:

## 2010-07-01 ENCOUNTER — Encounter: Payer: Self-pay | Admitting: Family Medicine

## 2010-07-01 ENCOUNTER — Ambulatory Visit (INDEPENDENT_AMBULATORY_CARE_PROVIDER_SITE_OTHER): Payer: Medicare Other | Admitting: Family Medicine

## 2010-07-01 DIAGNOSIS — E119 Type 2 diabetes mellitus without complications: Secondary | ICD-10-CM

## 2010-07-01 DIAGNOSIS — R197 Diarrhea, unspecified: Secondary | ICD-10-CM

## 2010-07-01 DIAGNOSIS — E785 Hyperlipidemia, unspecified: Secondary | ICD-10-CM

## 2010-07-01 MED ORDER — PRAVASTATIN SODIUM 40 MG PO TABS: 40.0000 mg | ORAL_TABLET | Freq: Every evening | ORAL | Status: DC

## 2010-07-01 NOTE — Assessment & Plan Note (Signed)
Can no longer afford lipitor (even generic) Trial of pravachol 40 Update if side eff or probs Will finish what she has and then change over Lab planned

## 2010-07-01 NOTE — Assessment & Plan Note (Signed)
Stopped her metformin in light of diarrhea - with imp  For now just working on diet and exercise Labs as planned with f/u

## 2010-07-01 NOTE — Progress Notes (Signed)
Subjective:    Patient ID: Linda Thompson, female    DOB: [birth year], 55 y.o.   MRN: [id number]  HPI Here for f/u of diarrhea/ IBS and also DM  Last visit was dealing with troublesome diarrhea -- and thought metformin may have caused it  Stopped that last visit  Was also taking fiber  No more diarrhea per patient   Had interim visit with Dr Reece Agar for sinus problems  Using saline nasal spray and also mucinex - and that did give her a bit of diarrhea  Stopped that Sinus symptoms are better   Unable to check sugar - is mentally disabled  Has not been on other medicines  Has gone to DM teaching in past  Is doing ok with diabetic diet  Has been walking for exercise and parks farther away  Not very active and overwt Lab Results  Component Value Date   HGBA1C 6.8* 01/28/2010    No more of the shaking episodes since stopping (? Hypoglycemia)  Patient Active Problem List  Diagnoses  . FUNGAL DERMATITIS  . MENINGIOMA  . DIABETES MELLITUS, TYPE II  . HYPERLIPIDEMIA  . MENTAL RETARDATION, MODERATE  . GLAUCOMA NOS  . HYPERTENSION  . ALLERGIC  RHINITIS  . GERD  . HIATAL HERNIA  . DERMATITIS, SEBORRHEIC NOS  . PRURITUS ANI  . OSTEOARTHRITIS, FOOT  . HEART MURMUR  . TRANSAMINASES, SERUM, ELEVATED  . Personal history of other musculoskeletal disorders  . IRRITABLE BOWEL SYNDROME  . ANKLE PAIN, LEFT  . Weakness of left leg  . Diarrhea  . Bowel incontinence   Past Medical History  Diagnosis Date  . Allergic rhinitis, cause unspecified   . Pain in joint, ankle and foot   . Backache, unspecified     chronic LBP with radiculopathy  . Seborrheic dermatitis, unspecified   . Type II or unspecified type diabetes mellitus without mention of complication, not stated as uncontrolled   . History of fracture of arm     Right  . History of fracture of foot   . Dermatomycosis, unspecified   . Esophageal reflux   . Unspecified glaucoma   . Undiagnosed cardiac murmurs   . Diaphragmatic  hernia without mention of obstruction or gangrene   . Other and unspecified hyperlipidemia   . Unspecified essential hypertension   . Irritable bowel syndrome   . Benign neoplasm of cerebral meninges   . Moderate intellectual disabilities   . Nausea alone   . Osteoarthrosis, unspecified whether generalized or localized, ankle and foot   . Pruritus ani   . Unspecified tinnitus   . Nonspecific elevation of levels of transaminase or lactic acid dehydrogenase (LDH)   . Leukorrhea, not specified as infective   . Obesity   . Hyperglycemia   . DDD (degenerative disc disease), lumbar    Past Surgical History  Procedure Date  . Total abdominal hysterectomy   . Cataract extraction    History  Substance Use Topics  . Smoking status: Never Smoker   . Smokeless tobacco: Not on file  . Alcohol Use: No   Family History  Problem Relation Age of Onset  . Diabetes Mother   . Diabetes      Aunt  . Stroke      Aunt (had pacemaker)   Allergies  Allergen Reactions  . Ace Inhibitors     REACTION: COUGH  . Cyclobenzaprine Hcl     REACTION: diarrhea  . Flonase (Fluticasone Propionate)     nosebleed  .  Ibuprofen     REACTION: vomiting  . WUJ:WJXBJYNWGNF+AOZHYQMVH+QIONGEXBMW Acid+Aspartame     REACTION: diarrhea   Current Outpatient Prescriptions on File Prior to Visit  Medication Sig Dispense Refill  . aspirin 81 MG tablet Take 81 mg by mouth daily.        . Calcium Carbonate-Vitamin D (CALCIUM-VITAMIN D) 500-200 MG-UNIT per tablet Take 1 tablet by mouth 2 (two) times daily with a meal.        . diltiazem (CARDIZEM CD) 240 MG 24 hr capsule Take 240 mg by mouth daily.        . hydrochlorothiazide (,MICROZIDE/HYDRODIURIL,) 12.5 MG capsule Take 12.5 mg by mouth daily.        Marland Kitchen ketoconazole (NIZORAL) 2 % shampoo Apply 1 application topically 2 (two) times a week.        Marland Kitchen KLOR-CON M20 20 MEQ tablet TAKE 1 TABLET EVERY DAY  30 tablet  3  . omeprazole (PRILOSEC) 20 MG capsule Take 20 mg by  mouth daily.        Marland Kitchen PROBIOTIC CAPS Take 1 capsule by mouth daily.        Marland Kitchen DISCONTD: atorvastatin (LIPITOR) 20 MG tablet Take 20 mg by mouth daily.        Marland Kitchen albuterol (VENTOLIN HFA) 108 (90 BASE) MCG/ACT inhaler Inhale 2 puffs into the lungs every 4 (four) hours as needed.        . Bismuth Subsalicylate (PEPTO-BISMOL MAX STRENGTH) 525 MG/15ML SUSP Take by mouth as directed.        . desloratadine (CLARINEX) 5 MG tablet Take 5 mg by mouth daily as needed. For allergies       . Psyllium (METAMUCIL) 30.9 % POWD Take by mouth as directed.        Marland Kitchen DISCONTD: potassium chloride (KLOR-CON) 20 MEQ packet Take 20 mEq by mouth daily.             Review of Systems Review of Systems  Constitutional: Negative for fever, appetite change, fatigue and unexpected weight change.  Eyes: Negative for pain and visual disturbance.  Respiratory: Negative for cough and shortness of breath.   Cardiovascular: Negative.  For cp or palpitations Gastrointestinal: Negative for nausea, diarrhea and constipation. no abd pain  Genitourinary: Negative for urgency and frequency. no excessive thirst  Skin: Negative for pallor. or rash  Neurological: Negative for weakness, light-headedness, numbness and headaches.  Hematological: Negative for adenopathy. Does not bruise/bleed easily.  Psychiatric/Behavioral: Negative for dysphoric mood. The patient is not nervous/anxious.          Objective:   Physical Exam  Constitutional: She appears well-developed and well-nourished. No distress.       overwt and well appearing   HENT:  Head: Normocephalic and atraumatic.  Nose: Nose normal.  Mouth/Throat: Oropharynx is clear and moist.  Eyes: Conjunctivae and EOM are normal. Pupils are equal, round, and reactive to light.  Neck: Normal range of motion. Neck supple. No JVD present. Carotid bruit is not present. No thyromegaly present.  Cardiovascular: Normal rate, regular rhythm, normal heart sounds and intact distal pulses.     Pulmonary/Chest: Effort normal and breath sounds normal. No respiratory distress. She has no wheezes.  Abdominal: Soft. Bowel sounds are normal. She exhibits no distension, no abdominal bruit and no mass. There is no tenderness. There is no guarding.  Musculoskeletal: She exhibits no edema and no tenderness.  Lymphadenopathy:    She has no cervical adenopathy.  Neurological: She is alert. She has normal  reflexes. Coordination normal.  Skin: Skin is warm and dry. No rash noted. No erythema. No pallor.  Psychiatric:       Baseline MR- pleasant and talkative           Assessment & Plan:

## 2010-07-01 NOTE — Patient Instructions (Addendum)
Stay off metformin (your diabetes pill)  Keep drinking water Walk as much as you can  Stick to diabetic diet and work on weight loss  We will change your cholesterol medicine to something cheaper-- pravachol (if any problems let me know )-- I sent the px right to the pharmacy Keep your next lab appt  I am not giving you any new diabetes medicine yet

## 2010-07-01 NOTE — Assessment & Plan Note (Signed)
This seems to have resolved off metformin (with a brief reocurrnace after mucinex) Will stay off metformin Continue fiber  Labs as planned and see if we will need to try glipizide (want to avoid if poss) We again rev low glycemic diet and need for exercise and wt loss

## 2010-07-29 ENCOUNTER — Telehealth: Payer: Self-pay | Admitting: *Deleted

## 2010-07-29 NOTE — Telephone Encounter (Signed)
Pt has dropped off form for DM[employer] for completion.  Form is on your shelf.

## 2010-07-30 ENCOUNTER — Telehealth: Payer: Self-pay | Admitting: Family Medicine

## 2010-07-30 DIAGNOSIS — E119 Type 2 diabetes mellitus without complications: Secondary | ICD-10-CM

## 2010-07-30 DIAGNOSIS — Z8739 Personal history of other diseases of the musculoskeletal system and connective tissue: Secondary | ICD-10-CM

## 2010-07-30 DIAGNOSIS — I10 Essential (primary) hypertension: Secondary | ICD-10-CM

## 2010-07-30 DIAGNOSIS — Z0279 Encounter for issue of other medical certificate: Secondary | ICD-10-CM

## 2010-07-30 DIAGNOSIS — E785 Hyperlipidemia, unspecified: Secondary | ICD-10-CM

## 2010-07-30 NOTE — Telephone Encounter (Signed)
Done and in IN box 

## 2010-07-30 NOTE — Telephone Encounter (Signed)
Completed form given to [facility] to contact pt and make copies of form to be scanned.

## 2010-07-30 NOTE — Telephone Encounter (Signed)
Message copied by Judy Pimple on Tue Jul 30, 2010  8:28 AM ------      Message from: Linda Thompson      Created: Mon Jul 29, 2010  9:38 AM      Regarding: Cpx labs thurs       Please order  future cpx labs for pt's upcomming lab appt.      Thanks      Rodney Booze

## 2010-08-01 ENCOUNTER — Ambulatory Visit (INDEPENDENT_AMBULATORY_CARE_PROVIDER_SITE_OTHER): Payer: Medicare Other

## 2010-08-01 ENCOUNTER — Other Ambulatory Visit (INDEPENDENT_AMBULATORY_CARE_PROVIDER_SITE_OTHER): Payer: Medicare Other

## 2010-08-01 ENCOUNTER — Inpatient Hospital Stay (INDEPENDENT_AMBULATORY_CARE_PROVIDER_SITE_OTHER)
Admission: RE | Admit: 2010-08-01 | Discharge: 2010-08-01 | Disposition: A | Discharge status: Home / Self Care | Payer: Medicare Other | Source: Ambulatory Visit | Attending: Family Medicine | Admitting: Family Medicine

## 2010-08-01 DIAGNOSIS — R109 Unspecified abdominal pain: Secondary | ICD-10-CM

## 2010-08-01 DIAGNOSIS — I10 Essential (primary) hypertension: Secondary | ICD-10-CM

## 2010-08-01 DIAGNOSIS — E785 Hyperlipidemia, unspecified: Secondary | ICD-10-CM

## 2010-08-01 DIAGNOSIS — E119 Type 2 diabetes mellitus without complications: Secondary | ICD-10-CM

## 2010-08-01 LAB — DIFFERENTIAL
Basophils Absolute: 0 10*3/uL (ref 0.0–0.1)
Eosinophils Relative: 1 % (ref 0–5)
Lymphocytes Relative: 27 % (ref 12–46)
Monocytes Absolute: 0.8 10*3/uL (ref 0.1–1.0)
Monocytes Relative: 8 % (ref 3–12)
Neutro Abs: 7 10*3/uL (ref 1.7–7.7)

## 2010-08-01 LAB — POCT URINALYSIS DIP (DEVICE)
Bilirubin Urine: NEGATIVE
Glucose, UA: NEGATIVE mg/dL
Hgb urine dipstick: NEGATIVE
Nitrite: NEGATIVE
Specific Gravity, Urine: 1.025 (ref 1.005–1.030)

## 2010-08-01 LAB — CBC WITH DIFFERENTIAL/PLATELET
Basophils Relative: 0.4 % (ref 0.0–3.0)
Eosinophils Relative: 1.3 % (ref 0.0–5.0)
Lymphocytes Relative: 24.3 % (ref 12.0–46.0)
MCV: 83.6 fl (ref 78.0–100.0)
Neutrophils Relative %: 66.6 % (ref 43.0–77.0)
RBC: 4.92 Mil/uL (ref 3.87–5.11)
WBC: 8.9 10*3/uL (ref 4.5–10.5)

## 2010-08-01 LAB — COMPREHENSIVE METABOLIC PANEL
Albumin: 4 g/dL (ref 3.5–5.2)
CO2: 26 mEq/L (ref 19–32)
Chloride: 104 mEq/L (ref 96–112)
GFR: 132.59 mL/min (ref >60.00)
Glucose, Bld: 110 mg/dL — ABNORMAL HIGH (ref 70–99)
Potassium: 3.8 mEq/L (ref 3.5–5.1)
Sodium: 144 mEq/L (ref 135–145)
Total Protein: 7 g/dL (ref 6.0–8.3)

## 2010-08-01 LAB — CBC
HCT: 40.8 % (ref 36.0–46.0)
Hemoglobin: 13.2 g/dL (ref 12.0–15.0)
MCH: 27 pg (ref 26.0–34.0)
MCHC: 32.4 g/dL (ref 30.0–36.0)
MCV: 83.4 fL (ref 78.0–100.0)
RDW: 14.8 % (ref 11.5–15.5)

## 2010-08-01 LAB — POCT I-STAT, CHEM 8
BUN: 11 mg/dL (ref 6–23)
Creatinine, Ser: 0.6 mg/dL (ref 0.50–1.10)
Glucose, Bld: 119 mg/dL — ABNORMAL HIGH (ref 70–99)
Hemoglobin: 15.3 g/dL — ABNORMAL HIGH (ref 12.0–15.0)
Potassium: 3.4 mEq/L — ABNORMAL LOW (ref 3.5–5.1)

## 2010-08-01 LAB — LIPID PANEL: Cholesterol: 165 mg/dL (ref 0–200)

## 2010-08-02 ENCOUNTER — Other Ambulatory Visit: Payer: Self-pay | Admitting: *Deleted

## 2010-08-02 ENCOUNTER — Telehealth: Payer: Self-pay | Admitting: *Deleted

## 2010-08-02 LAB — URINE CULTURE

## 2010-08-02 MED ORDER — ALBUTEROL SULFATE HFA 108 (90 BASE) MCG/ACT IN AERS: 2.0000 | INHALATION_SPRAY | RESPIRATORY_TRACT | Status: DC | PRN

## 2010-08-02 NOTE — Telephone Encounter (Signed)
Triage Record Num: [id number] Operator: Edythe Lynn Patient Name: Linda Thompson Call Date & Time: 08/02/2010 1:01:11AM Patient Phone: [phone] PCP: Audrie Gallus. [facility] Patient Gender: Female PCP Fax : Patient DOB: 21-May-1955 Practice Name: [facility] Reason for Call: Pt says she just started having a slight cough and some nasal congestion. No fever. Advised saline drops / spray prn, Robitussin prn. Humidifier. Pt to call back if needed. Protocol(s) Used: Upper Respiratory Infection (URI) Recommended Outcome per Protocol: Provide Home/Self Care Reason for Outcome: All other situations Care Advice: ~ Use a cool mist humidifier to moisten air. Be sure to clean according to manufacturer's instructions. ~ Consider use of a saline nasal spray per package directions to help relieve nasal congestion. Call provider immediately if temperature of 101.5 F (38.6 C) or more; any fever and immunocompromised; fatigue, cough with colored sputum, or if symptoms become worse, or last more than 10 days. ~ ~ SYMPTOM / CONDITION MANAGEMENT 08/02/2010 1:08:26AM Page 1 of 1 [email or web address]

## 2010-08-06 ENCOUNTER — Ambulatory Visit (INDEPENDENT_AMBULATORY_CARE_PROVIDER_SITE_OTHER): Payer: Medicare Other | Admitting: Family Medicine

## 2010-08-06 ENCOUNTER — Encounter: Payer: Self-pay | Admitting: Family Medicine

## 2010-08-06 DIAGNOSIS — K5900 Constipation, unspecified: Secondary | ICD-10-CM | POA: Insufficient documentation

## 2010-08-06 DIAGNOSIS — E785 Hyperlipidemia, unspecified: Secondary | ICD-10-CM

## 2010-08-06 DIAGNOSIS — I10 Essential (primary) hypertension: Secondary | ICD-10-CM

## 2010-08-06 DIAGNOSIS — E876 Hypokalemia: Secondary | ICD-10-CM

## 2010-08-06 DIAGNOSIS — E119 Type 2 diabetes mellitus without complications: Secondary | ICD-10-CM

## 2010-08-06 LAB — RENAL FUNCTION PANEL
BUN: 11 mg/dL (ref 6–23)
CO2: 30 mEq/L (ref 19–32)
Chloride: 108 mEq/L (ref 96–112)
GFR: 142.19 mL/min (ref >60.00)
Phosphorus: 3.1 mg/dL (ref 2.3–4.6)
Potassium: 3.6 mEq/L (ref 3.5–5.1)
Sodium: 143 mEq/L (ref 135–145)

## 2010-08-06 MED ORDER — METFORMIN HCL 500 MG PO TABS: 500.0000 mg | ORAL_TABLET | Freq: Two times a day (BID) | ORAL | Status: DC

## 2010-08-06 MED ORDER — POTASSIUM CHLORIDE CRYS ER 20 MEQ PO TBCR: 20.0000 meq | EXTENDED_RELEASE_TABLET | Freq: Every day | ORAL | Status: DC

## 2010-08-06 NOTE — Assessment & Plan Note (Addendum)
On recent x ray - was giving pt discomfort  Will go back to metformin which loosens stool Also fiber suppl daily- citrucel or metamucil  F/u 1 mo  Update if worse  Sent for last colonosc to review

## 2010-08-06 NOTE — Patient Instructions (Signed)
Please send for last colonoscopy report -- I think from [facility] today to re check potassium and calcium  You have too much stool in your colon and we need to get it out - even if that gives you loose stool for a while Start taking citrucel over the counter once daily as directed Also re start metformin for diabetes 500 mg twice daily  Work on weight loss with diet and exercise  I sent px to your pharmacy  Follow up with me in about 6 weeks

## 2010-08-06 NOTE — Progress Notes (Signed)
Subjective:    Patient ID: Linda Thompson, female    DOB: August 04, 1955, 55 y.o.   MRN: [id number]  HPI Here for check up of chronic med problems and to rev health mt list  Feels good in general  Needs refil of K   Wt is up 2 lb  Total hyst in past  No gyn problems   Was in urgent care recently - for ? Stomach problem- had lab and urine and x rays -- for stomach ache X ray showed constipation  Ca and K .1 point low  Urine cx nl     ? colonosc- ? Last one - unsure -- need to send for that  Knows she has had one a while back  Td04 ptx 2011 Gets flu shots  Mam 4/12 normal   On pravachol for chol and working fairly well Lab Results  Component Value Date   CHOL 165 08/01/2010   CHOL 148 01/28/2010   CHOL 147 05/15/2009   Lab Results  Component Value Date   HDL 42.00 08/01/2010   HDL 11.91* 01/28/2010   HDL 47.82* 05/15/2009   Lab Results  Component Value Date   LDLCALC 87 08/01/2010   LDLCALC 82 01/28/2010   LDLCALC 78 05/15/2009   Lab Results  Component Value Date   TRIG 180.0* 08/01/2010   TRIG 150.0* 01/28/2010   TRIG 196.0* 05/15/2009   Lab Results  Component Value Date   CHOLHDL 4 08/01/2010   CHOLHDL 4 01/28/2010   CHOLHDL 5 05/15/2009   No results found for this basename: LDLDIRECT   diet - does the best she can-- has had nutritionist consult in the past   Stopped metformin due to diarrhea (however now xrays show constipation)  Used anti diarrhea med for a while  a1c is 7 Has had dm teach opthy  K was 3.4- takes K- but needs a refil of it  No cramping or other symptoms   Patient Active Problem List  Diagnoses  . FUNGAL DERMATITIS  . MENINGIOMA  . DIABETES MELLITUS, TYPE II  . HYPERLIPIDEMIA  . MENTAL RETARDATION, MODERATE  . GLAUCOMA NOS  . HYPERTENSION  . ALLERGIC  RHINITIS  . GERD  . HIATAL HERNIA  . DERMATITIS, SEBORRHEIC NOS  . PRURITUS ANI  . OSTEOARTHRITIS, FOOT  . HEART MURMUR  . TRANSAMINASES, SERUM, ELEVATED  . Personal history of other  musculoskeletal disorders  . IRRITABLE BOWEL SYNDROME  . ANKLE PAIN, LEFT  . Weakness of left leg  . Diarrhea  . Bowel incontinence  . Constipation   Past Medical History  Diagnosis Date  . Allergic rhinitis, cause unspecified   . Pain in joint, ankle and foot   . Backache, unspecified     chronic LBP with radiculopathy  . Seborrheic dermatitis, unspecified   . Type II or unspecified type diabetes mellitus without mention of complication, not stated as uncontrolled   . History of fracture of arm     Right  . History of fracture of foot   . Dermatomycosis, unspecified   . Esophageal reflux   . Unspecified glaucoma   . Undiagnosed cardiac murmurs   . Diaphragmatic hernia without mention of obstruction or gangrene   . Other and unspecified hyperlipidemia   . Unspecified essential hypertension   . Irritable bowel syndrome   . Benign neoplasm of cerebral meninges   . Moderate intellectual disabilities   . Nausea alone   . Osteoarthrosis, unspecified whether generalized or localized, ankle and foot   .  Pruritus ani   . Unspecified tinnitus   . Nonspecific elevation of levels of transaminase or lactic acid dehydrogenase (LDH)   . Leukorrhea, not specified as infective   . Obesity   . Hyperglycemia   . DDD (degenerative disc disease), lumbar    Past Surgical History  Procedure Date  . Total abdominal hysterectomy   . Cataract extraction    History  Substance Use Topics  . Smoking status: Never Smoker   . Smokeless tobacco: Not on file  . Alcohol Use: No   Family History  Problem Relation Age of Onset  . Diabetes Mother   . Diabetes      Aunt  . Stroke      Aunt (had pacemaker)   Allergies  Allergen Reactions  . Ace Inhibitors     REACTION: COUGH  . Cyclobenzaprine Hcl     REACTION: diarrhea  . Flonase (Fluticasone Propionate)     nosebleed  . Ibuprofen     REACTION: vomiting  . ZOX:[id number]+XBJYNWGNF+AOZHYQMVHQ Acid+Aspartame     REACTION: diarrhea    Current Outpatient Prescriptions on File Prior to Visit  Medication Sig Dispense Refill  . aspirin 81 MG tablet Take 81 mg by mouth daily.        . Calcium Carbonate-Vitamin D (CALCIUM-VITAMIN D) 500-200 MG-UNIT per tablet Take 1 tablet by mouth 2 (two) times daily with a meal.        . diltiazem (CARDIZEM CD) 240 MG 24 hr capsule Take 240 mg by mouth daily.        . hydrochlorothiazide (,MICROZIDE/HYDRODIURIL,) 12.5 MG capsule Take 12.5 mg by mouth daily.        Marland Kitchen ketoconazole (NIZORAL) 2 % shampoo Apply 1 application topically 2 (two) times a week.        Marland Kitchen omeprazole (PRILOSEC) 20 MG capsule Take 20 mg by mouth daily.        Marland Kitchen PROBIOTIC CAPS Take 1 capsule by mouth daily.        Marland Kitchen albuterol (VENTOLIN HFA) 108 (90 BASE) MCG/ACT inhaler Inhale 2 puffs into the lungs every 4 (four) hours as needed.  1 Inhaler  6  . Bismuth Subsalicylate (PEPTO-BISMOL MAX STRENGTH) 525 MG/15ML SUSP Take by mouth as directed.        . desloratadine (CLARINEX) 5 MG tablet Take 5 mg by mouth daily as needed. For allergies       . pravastatin (PRAVACHOL) 40 MG tablet Take 1 tablet (40 mg total) by mouth every evening.  30 tablet  11  . Pseudoephedrine-Guaifenesin (MUCINEX D PO) Take 1 tablet by mouth 2 (two) times daily as needed.        . Psyllium (METAMUCIL) 30.9 % POWD Take by mouth as directed.        . sodium chloride (OCEAN) 0.65 % nasal spray Place 1 spray into the nose as needed.             Review of Systems Review of Systems  Constitutional: Negative for fever, appetite change, fatigue and unexpected weight change.  Eyes: Negative for pain and visual disturbance.  Respiratory: Negative for cough and shortness of breath.   Cardiovascular: Negative for cp or sob or edema or palpitations   Gastrointestinal: Negative for nausea, diarrhea and pos for constipation (no longer has abd pain) Genitourinary: Negative for urgency and frequency.  Skin: Negative for pallor. or rash  Neurological: Negative for  weakness, light-headedness, numbness and headaches.  Hematological: Negative for adenopathy. Does not bruise/bleed easily.  Psychiatric/Behavioral: Negative for dysphoric mood. The patient is not nervous/anxious.          Objective:   Physical Exam  Constitutional: She appears well-developed and well-nourished. No distress.       overwt and well appearing   HENT:  Head: Normocephalic and atraumatic.  Right Ear: External ear normal.  Left Ear: External ear normal.  Nose: Nose normal.  Mouth/Throat: Oropharynx is clear and moist.  Eyes: Conjunctivae and EOM are normal. Pupils are equal, round, and reactive to light.  Neck: Normal range of motion. Neck supple. No JVD present. Carotid bruit is not present. No thyromegaly present.  Cardiovascular: Normal rate, regular rhythm, normal heart sounds and intact distal pulses.   No murmur heard. Pulmonary/Chest: Effort normal and breath sounds normal. No respiratory distress. She has no wheezes.  Abdominal: Soft. Bowel sounds are normal. She exhibits no distension, no abdominal bruit and no mass. There is no tenderness.  Genitourinary: No breast swelling, tenderness, discharge or bleeding.  Musculoskeletal: Normal range of motion. She exhibits no edema and no tenderness.  Lymphadenopathy:    She has no cervical adenopathy.  Neurological: She is alert. She has normal reflexes. No cranial nerve deficit. Coordination normal.  Skin: Skin is warm and dry. No rash noted. No erythema. No pallor.  Psychiatric: She has a normal mood and affect.       Baseline MR - pleasant and talkative Repeats herself frequently           Assessment & Plan:

## 2010-08-08 DIAGNOSIS — E876 Hypokalemia: Secondary | ICD-10-CM | POA: Insufficient documentation

## 2010-08-08 LAB — HM DIABETES EYE EXAM: HM Diabetic Eye Exam: NEGATIVE

## 2010-08-08 NOTE — Assessment & Plan Note (Signed)
This is a bit worse off metformin  This gave her loose stools - but now since she has stool retention will go ahead and re start it Has had DM ed -- but MR does make it difficult for lifestyle change Disc low glycemic diet/ exercise and diet  Needs to loose wt- disc this again  F/u 6 wk

## 2010-08-08 NOTE — Assessment & Plan Note (Signed)
On K supplement- this was slt low in ER (also ca) Re check those today No diarrhea or cramps

## 2010-08-08 NOTE — Assessment & Plan Note (Signed)
Improved with better diet Rev labs with pt

## 2010-08-08 NOTE — Assessment & Plan Note (Signed)
This remains stable and well controlled No change in med Rev labs with pt  Enc her to keep exercising

## 2010-08-08 NOTE — Assessment & Plan Note (Signed)
This is stable on statin and diet Again rev low sat fat diet  Rev labs with pt

## 2010-08-16 ENCOUNTER — Telehealth: Payer: Self-pay | Admitting: *Deleted

## 2010-08-16 NOTE — Telephone Encounter (Signed)
[facility] Triage Call Report Triage Record Num: [id number] Operator: Martie Lee Long Patient Name: Linda Thompson Call Date & Time: 08/15/2010 7:25:10PM Patient Phone: [phone] PCP: Audrie Gallus. Tower Patient Gender: Female PCP Fax : Patient DOB: [birth year] Practice Name: [facility] Reason for Call: Amorina/Patient calling about a burning sensation in her stomach. Pt reports that this happens after she eats, but she noticed that it started after she started her Metformin. Onset 08/05/10. Instructed to be seen by Provider within 24hrs. Protocol(s) Used: Heartburn Recommended Outcome per Protocol: See Provider within 24 hours Reason for Outcome: Abdominal discomfort (feeling of fullness/bloating, nausea) AND has started new prescription, nonprescription or alternative medication OR change in dose or frequency of usual medication Care Advice: ~ SYMPTOM / CONDITION MANAGEMENT 08/15/2010 7:40:08PM Page 1 of 1 [email or web address]

## 2010-08-22 ENCOUNTER — Telehealth: Payer: Self-pay

## 2010-08-22 ENCOUNTER — Encounter: Payer: Self-pay | Admitting: Family Medicine

## 2010-08-22 NOTE — Telephone Encounter (Signed)
Received fax from [facility] and Dr Milinda Antis wanted health maintenance updated with negative DM eye exam but I could not get quick abstraction so I could not update but I will send fax to be scanned into pt's chart.

## 2010-08-27 ENCOUNTER — Other Ambulatory Visit: Payer: Self-pay | Admitting: Family Medicine

## 2010-08-28 NOTE — Telephone Encounter (Signed)
[facility] request refills on Klor con . Refused was filled electronically on 08/06/10 for #[phone] x 11.

## 2010-09-13 ENCOUNTER — Ambulatory Visit (INDEPENDENT_AMBULATORY_CARE_PROVIDER_SITE_OTHER): Payer: Medicare Other | Admitting: Family Medicine

## 2010-09-13 ENCOUNTER — Encounter: Payer: Self-pay | Admitting: Family Medicine

## 2010-09-13 VITALS — BP 136/80 | HR 88 | Temp 97.4°F | Ht 62.0 in | Wt 196.0 lb

## 2010-09-13 DIAGNOSIS — M549 Dorsalgia, unspecified: Secondary | ICD-10-CM

## 2010-09-13 DIAGNOSIS — N390 Urinary tract infection, site not specified: Secondary | ICD-10-CM | POA: Insufficient documentation

## 2010-09-13 DIAGNOSIS — M545 Low back pain: Secondary | ICD-10-CM | POA: Insufficient documentation

## 2010-09-13 LAB — POCT URINALYSIS DIPSTICK
Glucose, UA: NEGATIVE
Nitrite, UA: NEGATIVE
Spec Grav, UA: 1.02
Urobilinogen, UA: 0.2

## 2010-09-13 MED ORDER — SULFAMETHOXAZOLE-TRIMETHOPRIM 800-160 MG PO TABS: 1.0000 | ORAL_TABLET | Freq: Two times a day (BID) | ORAL | Status: AC

## 2010-09-13 NOTE — Assessment & Plan Note (Signed)
Pos ua today in conj with frequency and urgency and low back pain  tx with septra ds for 7 d Update if side eff or no imp  Sent for cx also

## 2010-09-13 NOTE — Assessment & Plan Note (Signed)
Low back pain worse on L - likley multifactorial - with hx of disc and facet dx and also new uti Will tx uti Recommend gentle heat for muscle spasm and update if worse or any neuro symptoms  Advised gentle stretching as well

## 2010-09-13 NOTE — Patient Instructions (Signed)
Drink lots of water and avoid other beverages  Take the septra DS 1 pill twice daily for 1 week - if any side effects let me know (this is for urinary infection)  I think your back pain is partially from your disc disease/arthritis and partially from urinary infection  Use some gentle heat on your back to relax the muscles (like a heating pad on low for 10 minutes at a time )  If your back pain worsens or you do not improve let me know  We will send your urine for a culture and call to update you in 3-4 days

## 2010-09-13 NOTE — Progress Notes (Signed)
Subjective:    Patient ID: Linda Thompson, female    DOB: 05-Nov-1955, 55 y.o.   MRN: [id number]  HPI Started having symptoms yesterday - with pain in the middle of her low back  Sometimes her back pain radiates to L leg  No burning to urinate - but did have much more frequency last night -- and some urge incontinence that is not common for her  No blood in urine  No bladder pain or discomfort  No fever or nausea/ vomiting   Also has hx of chronic lumbar pain  Has (from MRI of back ) in 2010 degenerative disc protrusion at L3-L4 Also L sided facet dz at L 4-L5  She is having more soreness on L and that is the leg that sometimes hurts   Patient Active Problem List  Diagnoses  . FUNGAL DERMATITIS  . MENINGIOMA  . DIABETES MELLITUS, TYPE II  . HYPERLIPIDEMIA  . MENTAL RETARDATION, MODERATE  . GLAUCOMA NOS  . HYPERTENSION  . ALLERGIC  RHINITIS  . GERD  . HIATAL HERNIA  . DERMATITIS, SEBORRHEIC NOS  . PRURITUS ANI  . OSTEOARTHRITIS, FOOT  . HEART MURMUR  . TRANSAMINASES, SERUM, ELEVATED  . Personal history of other musculoskeletal disorders  . IRRITABLE BOWEL SYNDROME  . ANKLE PAIN, LEFT  . Hypokalemia  . Back pain  . UTI (lower urinary tract infection)   Past Medical History  Diagnosis Date  . Allergic rhinitis, cause unspecified   . Pain in joint, ankle and foot   . Backache, unspecified     chronic LBP with radiculopathy  . Seborrheic dermatitis, unspecified   . Type II or unspecified type diabetes mellitus without mention of complication, not stated as uncontrolled   . History of fracture of arm     Right  . History of fracture of foot   . Dermatomycosis, unspecified   . Esophageal reflux   . Unspecified glaucoma   . Undiagnosed cardiac murmurs   . Diaphragmatic hernia without mention of obstruction or gangrene   . Other and unspecified hyperlipidemia   . Unspecified essential hypertension   . Irritable bowel syndrome   . Benign neoplasm of cerebral meninges    . Moderate intellectual disabilities   . Nausea alone   . Osteoarthrosis, unspecified whether generalized or localized, ankle and foot   . Pruritus ani   . Unspecified tinnitus   . Nonspecific elevation of levels of transaminase or lactic acid dehydrogenase (LDH)   . Leukorrhea, not specified as infective   . Obesity   . Hyperglycemia   . DDD (degenerative disc disease), lumbar    Past Surgical History  Procedure Date  . Total abdominal hysterectomy   . Cataract extraction    History  Substance Use Topics  . Smoking status: Never Smoker   . Smokeless tobacco: Not on file  . Alcohol Use: No   Family History  Problem Relation Age of Onset  . Diabetes Mother   . Diabetes      Aunt  . Stroke      Aunt (had pacemaker)   Allergies  Allergen Reactions  . Ace Inhibitors     REACTION: COUGH  . Cyclobenzaprine Hcl     REACTION: diarrhea  . Flonase (Fluticasone Propionate)     nosebleed  . Ibuprofen     REACTION: vomiting  . WUJ:[id number]+AOZHYQMVH+QIONGEXBMW Acid+Aspartame     REACTION: diarrhea   Current Outpatient Prescriptions on File Prior to Visit  Medication Sig Dispense Refill  .  aspirin 81 MG tablet Take 81 mg by mouth daily.        Marland Kitchen atorvastatin (LIPITOR) 20 MG tablet Take 20 mg by mouth daily. 08-06-10 pt is finishing lipitor 20mg  and when finishes this last bottle will start on Pravastatin.       . Calcium Carbonate-Vitamin D (CALCIUM-VITAMIN D) 500-200 MG-UNIT per tablet Take 1 tablet by mouth 2 (two) times daily with a meal.        . diltiazem (CARDIZEM CD) 240 MG 24 hr capsule Take 240 mg by mouth daily.        . hydrochlorothiazide (,MICROZIDE/HYDRODIURIL,) 12.5 MG capsule Take 12.5 mg by mouth daily.        Marland Kitchen ketoconazole (NIZORAL) 2 % shampoo Apply 1 application topically 2 (two) times a week.        . metFORMIN (GLUCOPHAGE) 500 MG tablet Take 1 tablet (500 mg total) by mouth 2 (two) times daily with a meal.  30 tablet  11  . methylcellulose oral  powder Take by mouth daily.        Marland Kitchen omeprazole (PRILOSEC) 20 MG capsule Take 20 mg by mouth daily.        . potassium chloride SA (KLOR-CON M20) 20 MEQ tablet Take 1 tablet (20 mEq total) by mouth daily.  30 tablet  11  . PROBIOTIC CAPS Take 1 capsule by mouth daily.        Marland Kitchen albuterol (VENTOLIN HFA) 108 (90 BASE) MCG/ACT inhaler Inhale 2 puffs into the lungs every 4 (four) hours as needed.  1 Inhaler  6  . Bismuth Subsalicylate (PEPTO-BISMOL MAX STRENGTH) 525 MG/15ML SUSP Take by mouth as directed.        . desloratadine (CLARINEX) 5 MG tablet Take 5 mg by mouth daily as needed. For allergies       . pravastatin (PRAVACHOL) 40 MG tablet Take 1 tablet (40 mg total) by mouth every evening.  30 tablet  11  . Pseudoephedrine-Guaifenesin (MUCINEX D PO) Take 1 tablet by mouth 2 (two) times daily as needed.        . Psyllium (METAMUCIL) 30.9 % POWD Take by mouth as directed.        . sodium chloride (OCEAN) 0.65 % nasal spray Place 1 spray into the nose as needed.            Review of Systems Review of Systems  Constitutional: Negative for fever, appetite change, fatigue and unexpected weight change.  Eyes: Negative for pain and visual disturbance.  Respiratory: Negative for cough and shortness of breath.   Cardiovascular: Negative.  for cp or palpitations Gastrointestinal: Negative for nausea, diarrhea and constipation.  Genitourinary: Negative for dysuria/ hematuria / nausea  Skin: Negative for pallor. or rash  Neurological: Negative for weakness, light-headedness, numbness and headaches.  Hematological: Negative for adenopathy. Does not bruise/bleed easily.  Psychiatric/Behavioral: Negative for dysphoric mood. The patient is not nervous/anxious.          Objective:   Physical Exam  Constitutional: She appears well-developed and well-nourished. No distress.       overwt and well appearing   HENT:  Head: Normocephalic and atraumatic.  Mouth/Throat: Oropharynx is clear and moist.    Eyes: Conjunctivae and EOM are normal. Pupils are equal, round, and reactive to light.  Neck: Normal range of motion. Neck supple. No thyromegaly present.  Cardiovascular: Normal rate, regular rhythm, normal heart sounds and intact distal pulses.   Pulmonary/Chest: Effort normal and breath sounds normal. She has  no wheezes.  Abdominal: Soft. Bowel sounds are normal. She exhibits no distension and no mass. There is no tenderness.  Musculoskeletal: She exhibits tenderness. She exhibits no edema.       Some tenderness along L3- L4 area and in L lumbar musculature  Flex 90 deg and ext 20-30 deg Pain on L lateral bend  Neg SLR bilat   Lymphadenopathy:    She has no cervical adenopathy.  Neurological: She is alert. She has normal strength and normal reflexes. She displays no atrophy. No sensory deficit.  Skin: Skin is warm and dry. No rash noted.  Psychiatric: She has a normal mood and affect.       Baseline MR Cheerful and talkative Repeats questions about what she can drink with DM- I answered water is the best option           Assessment & Plan:

## 2010-09-15 LAB — URINE CULTURE: Colony Count: 50000

## 2010-09-17 ENCOUNTER — Telehealth: Payer: Self-pay | Admitting: *Deleted

## 2010-09-17 NOTE — Telephone Encounter (Signed)
[facility] Triage Call Report Triage Record Num: [id number] Operator: Ether Griffins Patient Name: Linda Thompson Call Date & Time: 09/16/2010 9:21:38PM Patient Phone: [phone] PCP: Audrie Gallus. Tower Patient Gender: Female PCP Fax : Patient DOB: March 21, 1955 Practice Name: [facility] Reason for Call: Calling about diarrhea.Onset 09/16/10. Saw MD on 09/13/10--dx with UTI and given antibx. Has been taking antibx, has been drinking lots of water and eating yogurt. All emergent sxs of Diarrhea or other change in bowel habits r/o.Advised to call office in am if sxs continue. Protocol(s) Used: Diarrhea or Other Change in Bowel Habits Recommended Outcome per Protocol: See Provider within 24 hours Reason for Outcome: Diarrhea began 3-5 days after starting an antibiotic Care Advice: ~ Maintain dietary recommendations for pre-existing conditions. ~ Continue taking prescribed medications as directed until provider is consulted. ~ SYMPTOM / CONDITION MANAGEMENT ~ CAUTIONS Diarrheal Care: - Drink 2-3 quarts (2-3 liters) per day of low sugar content fluids, including over the counter oral hydration solution, unless directed otherwise by provider. - If accompanied by vomiting, take the fluids in frequent small sips or suck on ice chips. - Eat easily digested foods (such as bananas, rice, applesauce, toast, cooked cereals, soup, crackers, baked or boiled potato, or baked chicken or [place] without skin). - Do not eat high fiber, high fat, high sugar content foods, or highly seasoned foods. - Do not drink caffeinated or alcoholic beverages. - Avoid milk and milk products while having symptoms. As symptoms improve, gradually add back to diet. - Application of A&D ointment or witch hazel medicated pads may help anal irritation. - Antidiarrheal medications are usually unnecessary. If symptoms are severe, consider nonprescription antidiarrheal and anti-motility drugs as directed by label or a  provider. Do not take if have high fever or bloody diarrhea. If pregnant, do not take any medications not approved by your provider. - Consult your provider for advice regarding continuing prescription medication. ~ Consider eating yogurts or other foods that contain probiotics, especially if you have had antibiotic associated diarrhea before. Ask your provider if probiotic supplements could help you before you begin to take the supplements. ~ 09/16/2010 9:34:20PM Page 1 of 1 [email or web address]

## 2010-09-18 ENCOUNTER — Telehealth: Payer: Self-pay

## 2010-09-18 ENCOUNTER — Ambulatory Visit: Payer: Medicare Other | Admitting: Family Medicine

## 2010-09-18 MED ORDER — FLUCONAZOLE 150 MG PO TABS: 150.0000 mg | ORAL_TABLET | Freq: Once | ORAL | Status: AC

## 2010-09-18 NOTE — Telephone Encounter (Signed)
Patient notified as instructed by telephone. Pt verbalized understanding and repeated to me to stop Lipitor for 5 days after taking Diflucan. If pt does not improve she will call back if needed.

## 2010-09-18 NOTE — Telephone Encounter (Signed)
Pt calls back and cannot put in Monistat cream herself. Pt wants Diflucan called in to [facility].Please advise.

## 2010-09-18 NOTE — Telephone Encounter (Signed)
Pt came by office and complains with itching in vagina. No other itching or rash.Pharmacist told pt to eat yogurt.Pt wants to know if should continue Sulfa. Sulfa is helping her urinary symptoms. Dr Milinda Antis said may be yeast infection and to get OTC Monistat. Pt to continue Sulfa unless has further problem such as rash or generalized itching at which time she will stop Sulfa. Pt will call back if Monistat does not take care of itching.

## 2010-09-18 NOTE — Telephone Encounter (Signed)
Will refill electronically  Have her hold her lipitor for 5 days after taking this pill - since it can interact

## 2010-09-24 ENCOUNTER — Other Ambulatory Visit: Payer: Self-pay | Admitting: *Deleted

## 2010-09-24 NOTE — Telephone Encounter (Signed)
Patient was given Fluconazole last week for itching. Patient stated that it did help some, but requested that another one be called in because she is still having some itching. Pharm[facility]

## 2010-09-24 NOTE — Telephone Encounter (Signed)
Please schedule f/u when able since she is not better - need to see if this is yeast or something else thanks

## 2010-09-24 NOTE — Telephone Encounter (Signed)
Patient advised as instructed via telephone.  She will see how she does and call back to schedule an appt if she needs to.

## 2010-10-01 ENCOUNTER — Ambulatory Visit: Payer: Medicare Other | Admitting: Family Medicine

## 2010-10-02 ENCOUNTER — Ambulatory Visit (INDEPENDENT_AMBULATORY_CARE_PROVIDER_SITE_OTHER): Payer: Medicare Other | Admitting: Family Medicine

## 2010-10-02 ENCOUNTER — Encounter: Payer: Self-pay | Admitting: Family Medicine

## 2010-10-02 VITALS — BP 129/86 | HR 88 | Temp 97.7°F | Ht 62.0 in | Wt 196.0 lb

## 2010-10-02 DIAGNOSIS — N898 Other specified noninflammatory disorders of vagina: Secondary | ICD-10-CM | POA: Insufficient documentation

## 2010-10-02 DIAGNOSIS — L293 Anogenital pruritus, unspecified: Secondary | ICD-10-CM

## 2010-10-02 LAB — POCT WET PREP (WET MOUNT): KOH Wet Prep POC: POSITIVE

## 2010-10-02 MED ORDER — TERCONAZOLE 0.8 % VA CREA: TOPICAL_CREAM | VAGINAL | Status: DC

## 2010-10-02 NOTE — Patient Instructions (Signed)
Use the terazol cream on itchy areas  I do think you have an external yeast infection Let me know if not improved in 1 week

## 2010-10-02 NOTE — Assessment & Plan Note (Signed)
Yeast infection - mostly external with scant excoriation Few yeast buds on wet prep (pt had taken one diflucan with relief of internal symptoms) Given px for terazol 3 cream to use externally and will update if not imp  Adv also to keep area clean and dry

## 2010-10-02 NOTE — Progress Notes (Signed)
Subjective:    Patient ID: Linda Thompson, female    DOB: 1955-07-08, 55 y.o.   MRN: [id number]  HPI Thinks she has a yeast infection  Is itching "in front"  Took a diflucan -- helped a little , but not gone  Is vaginal discharge Just itches on outside - not really internally Not anal this time Cannot see any rash     Had one episode of diarrhea - and used otc anti diarrhea  This helped a lot  Felt a bit weak Better now   Patient Active Problem List  Diagnoses  . FUNGAL DERMATITIS  . MENINGIOMA  . DIABETES MELLITUS, TYPE II  . HYPERLIPIDEMIA  . MENTAL RETARDATION, MODERATE  . GLAUCOMA NOS  . HYPERTENSION  . ALLERGIC  RHINITIS  . GERD  . HIATAL HERNIA  . DERMATITIS, SEBORRHEIC NOS  . PRURITUS ANI  . OSTEOARTHRITIS, FOOT  . HEART MURMUR  . TRANSAMINASES, SERUM, ELEVATED  . Personal history of other musculoskeletal disorders  . IRRITABLE BOWEL SYNDROME  . ANKLE PAIN, LEFT  . Hypokalemia  . Back pain  . UTI (lower urinary tract infection)  . Vaginal itching   Past Medical History  Diagnosis Date  . Allergic rhinitis, cause unspecified   . Pain in joint, ankle and foot   . Backache, unspecified     chronic LBP with radiculopathy  . Seborrheic dermatitis, unspecified   . Type II or unspecified type diabetes mellitus without mention of complication, not stated as uncontrolled   . History of fracture of arm     Right  . History of fracture of foot   . Dermatomycosis, unspecified   . Esophageal reflux   . Unspecified glaucoma   . Undiagnosed cardiac murmurs   . Diaphragmatic hernia without mention of obstruction or gangrene   . Other and unspecified hyperlipidemia   . Unspecified essential hypertension   . Irritable bowel syndrome   . Benign neoplasm of cerebral meninges   . Moderate intellectual disabilities   . Nausea alone   . Osteoarthrosis, unspecified whether generalized or localized, ankle and foot   . Pruritus ani   . Unspecified tinnitus   .  Nonspecific elevation of levels of transaminase or lactic acid dehydrogenase (LDH)   . Leukorrhea, not specified as infective   . Obesity   . Hyperglycemia   . DDD (degenerative disc disease), lumbar    Past Surgical History  Procedure Date  . Total abdominal hysterectomy   . Cataract extraction    History  Substance Use Topics  . Smoking status: Never Smoker   . Smokeless tobacco: Not on file  . Alcohol Use: No   Family History  Problem Relation Age of Onset  . Diabetes Mother   . Diabetes      Aunt  . Stroke      Aunt (had pacemaker)   Allergies  Allergen Reactions  . Ace Inhibitors     REACTION: COUGH  . Cyclobenzaprine Hcl     REACTION: diarrhea  . Flonase (Fluticasone Propionate)     nosebleed  . Ibuprofen     REACTION: vomiting  . HYQ:MVHQIONGEXB+MWUXLKGMW+NUUVOZDGUY Acid+Aspartame     REACTION: diarrhea   Current Outpatient Prescriptions on File Prior to Visit  Medication Sig Dispense Refill  . aspirin 81 MG tablet Take 81 mg by mouth daily.        Marland Kitchen atorvastatin (LIPITOR) 20 MG tablet Take 20 mg by mouth daily. 08-06-10 pt is finishing lipitor 20mg  and when finishes  this last bottle will start on Pravastatin.       . Calcium Carbonate-Vitamin D (CALCIUM-VITAMIN D) 500-200 MG-UNIT per tablet Take 1 tablet by mouth 2 (two) times daily with a meal.        . diltiazem (CARDIZEM CD) 240 MG 24 hr capsule Take 240 mg by mouth daily.        . hydrochlorothiazide (,MICROZIDE/HYDRODIURIL,) 12.5 MG capsule Take 12.5 mg by mouth daily.        Marland Kitchen ketoconazole (NIZORAL) 2 % shampoo Apply 1 application topically 2 (two) times a week.        . metFORMIN (GLUCOPHAGE) 500 MG tablet Take 1 tablet (500 mg total) by mouth 2 (two) times daily with a meal.  30 tablet  11  . methylcellulose oral powder Take by mouth daily.        Marland Kitchen omeprazole (PRILOSEC) 20 MG capsule Take 20 mg by mouth daily.        . potassium chloride SA (KLOR-CON M20) 20 MEQ tablet Take 1 tablet (20 mEq total) by  mouth daily.  30 tablet  11  . PROBIOTIC CAPS Take 1 capsule by mouth daily.        Marland Kitchen albuterol (VENTOLIN HFA) 108 (90 BASE) MCG/ACT inhaler Inhale 2 puffs into the lungs every 4 (four) hours as needed.  1 Inhaler  6  . Bismuth Subsalicylate (PEPTO-BISMOL MAX STRENGTH) 525 MG/15ML SUSP Take by mouth as directed.        . desloratadine (CLARINEX) 5 MG tablet Take 5 mg by mouth daily as needed. For allergies       . pravastatin (PRAVACHOL) 40 MG tablet Take 1 tablet (40 mg total) by mouth every evening.  30 tablet  11  . Pseudoephedrine-Guaifenesin (MUCINEX D PO) Take 1 tablet by mouth 2 (two) times daily as needed.        . Psyllium (METAMUCIL) 30.9 % POWD Take by mouth as directed.        . sodium chloride (OCEAN) 0.65 % nasal spray Place 1 spray into the nose as needed.           Review of Systems Review of Systems  Constitutional: Negative for fever, appetite change, fatigue and unexpected weight change.  Eyes: Negative for pain and visual disturbance.  Respiratory: Negative for cough and shortness of breath.   Cardiovascular: Negative for cp or palpitations    Gastrointestinal: Negative for nausea, diarrhea and constipation.  Genitourinary: Negative for urgency and frequency.  Skin: Negative for pallor or rash  pos for itching of skin around vagina  Neurological: Negative for weakness, light-headedness, numbness and headaches.  Hematological: Negative for adenopathy. Does not bruise/bleed easily.  Psychiatric/Behavioral: Negative for dysphoric mood. The patient is not nervous/anxious.  (does repeat herself frequently and is mentally retarded)        Objective:   Physical Exam  Constitutional: She appears well-developed and well-nourished. No distress.       overwt and well appearing   HENT:  Head: Normocephalic and atraumatic.  Mouth/Throat: Oropharynx is clear and moist.  Eyes: Conjunctivae and EOM are normal. Pupils are equal, round, and reactive to light. No scleral icterus.    Neck: Normal range of motion. Neck supple. No JVD present. No thyromegaly present.  Cardiovascular: Normal rate, regular rhythm and normal heart sounds.   Pulmonary/Chest: Breath sounds normal. No respiratory distress. She has no wheezes.  Abdominal: Soft. Bowel sounds are normal. She exhibits no distension and no mass. There is no tenderness.  No suprapubic tenderness  Genitourinary: No vaginal discharge found.       See skin exam   Neurological: She is alert. She has normal reflexes.  Skin: Skin is warm and dry. Rash noted. There is erythema. No pallor.       Erythema on labia minora (injection) without swelling Also in skin folds of thighs  No vaginal d/c  Psychiatric:       Baseline MR          Assessment & Plan:

## 2010-10-04 ENCOUNTER — Other Ambulatory Visit: Payer: Self-pay | Admitting: Family Medicine

## 2010-10-04 NOTE — Telephone Encounter (Signed)
Will refill electronically  

## 2010-10-04 NOTE — Telephone Encounter (Signed)
Patient states that she thinks she has a UTI.  No appts available today.  Will you accept a urine specimen or shall I recommend the Saturday clinic tomorrow?

## 2010-10-04 NOTE — Telephone Encounter (Signed)
[facility] electronically request refill Ketoconazole 2% cream. Med was removed from med list 12/2009. Spoke with pt and she uses cream periodically under breast and groin area if she perspires a lot. I added to med list.Please advise.

## 2010-10-08 ENCOUNTER — Inpatient Hospital Stay (INDEPENDENT_AMBULATORY_CARE_PROVIDER_SITE_OTHER)
Admission: RE | Admit: 2010-10-08 | Discharge: 2010-10-08 | Disposition: A | Discharge status: Home / Self Care | Payer: Medicare Other | Source: Ambulatory Visit | Attending: Family Medicine | Admitting: Family Medicine

## 2010-10-08 DIAGNOSIS — M545 Low back pain: Secondary | ICD-10-CM

## 2010-10-08 LAB — POCT URINALYSIS DIP (DEVICE)
Glucose, UA: NEGATIVE mg/dL
Nitrite: NEGATIVE
Urobilinogen, UA: 0.2 mg/dL (ref 0.0–1.0)
pH: 5.5 (ref 5.0–8.0)

## 2010-10-09 ENCOUNTER — Encounter: Payer: Self-pay | Admitting: Family Medicine

## 2010-10-09 ENCOUNTER — Telehealth: Payer: Self-pay | Admitting: *Deleted

## 2010-10-09 ENCOUNTER — Ambulatory Visit (INDEPENDENT_AMBULATORY_CARE_PROVIDER_SITE_OTHER): Payer: Medicare Other | Admitting: Family Medicine

## 2010-10-09 DIAGNOSIS — M549 Dorsalgia, unspecified: Secondary | ICD-10-CM

## 2010-10-09 DIAGNOSIS — K589 Irritable bowel syndrome without diarrhea: Secondary | ICD-10-CM

## 2010-10-09 LAB — URINE CULTURE

## 2010-10-09 NOTE — Telephone Encounter (Signed)
[facility] Triage Call Report Triage Record Num: [id number] Operator: Aundra Millet Patient Name: Linda Thompson Call Date & Time: 10/09/2010 3:01:50AM Patient Phone: [phone] PCP: Audrie Gallus. Tower Patient Gender: Female PCP Fax : Patient DOB: 1955/05/26 Practice Name: [name] Reason for Call: Today, 10/09/2010, pt calling she had called earlier today about burning pain after BM and having to strain and "force it out" . Pt has been seen before for sx';s. Pt spoke to RPh and purchased Stool Softner per RN recommendations and wants to know if it will produce a BM, and "Clean me out". -- RN adv that it will soften the stool, but shouldnt expect results like with a laxative. Protocol(s) Used: Medication Question Calls, No Triage (Adults) Recommended Outcome per Protocol: Provide Information or Advice Only Reason for Outcome: Caller has medication question only and triager answers question Care Advice: ~ 10/09/2010 3:19:33AM Page 1 of 1 [email or web address]_[email or web address]

## 2010-10-09 NOTE — Progress Notes (Signed)
Subjective:    Patient ID: Linda Thompson, female    DOB: 12/23/1955, 55 y.o.   MRN: [id number]  HPI Having trouble with bms again  Lately - this week - when she has a bm really has to push No abdominal pain No blood in stool Uses baby wipe to clean afterwards - and some burning  Put some anti fungal cream on anus Called nurse line and they recommended a stool softener and also baby wipes with aloe   She does use a fiber supplement daily  Is still on her metformin    Wt is stable   Went to urgent care this week too for low back pain  Did a ua  No uti  Told is back problem- and she may make appt with Dr Samson Frederic some tylenol for her pain   Patient Active Problem List  Diagnoses  . FUNGAL DERMATITIS  . MENINGIOMA  . DIABETES MELLITUS, TYPE II  . HYPERLIPIDEMIA  . MENTAL RETARDATION, MODERATE  . GLAUCOMA NOS  . HYPERTENSION  . ALLERGIC  RHINITIS  . GERD  . HIATAL HERNIA  . DERMATITIS, SEBORRHEIC NOS  . PRURITUS ANI  . OSTEOARTHRITIS, FOOT  . HEART MURMUR  . TRANSAMINASES, SERUM, ELEVATED  . Personal history of other musculoskeletal disorders  . IRRITABLE BOWEL SYNDROME  . ANKLE PAIN, LEFT  . Hypokalemia  . Back pain  . UTI (lower urinary tract infection)  . Vaginal itching   Past Medical History  Diagnosis Date  . Allergic rhinitis, cause unspecified   . Pain in joint, ankle and foot   . Backache, unspecified     chronic LBP with radiculopathy  . Seborrheic dermatitis, unspecified   . Type II or unspecified type diabetes mellitus without mention of complication, not stated as uncontrolled   . History of fracture of arm     Right  . History of fracture of foot   . Dermatomycosis, unspecified   . Esophageal reflux   . Unspecified glaucoma   . Undiagnosed cardiac murmurs   . Diaphragmatic hernia without mention of obstruction or gangrene   . Other and unspecified hyperlipidemia   . Unspecified essential hypertension   . Irritable bowel syndrome   .  Benign neoplasm of cerebral meninges   . Moderate intellectual disabilities   . Nausea alone   . Osteoarthrosis, unspecified whether generalized or localized, ankle and foot   . Pruritus ani   . Unspecified tinnitus   . Nonspecific elevation of levels of transaminase or lactic acid dehydrogenase (LDH)   . Leukorrhea, not specified as infective   . Obesity   . Hyperglycemia   . DDD (degenerative disc disease), lumbar    Past Surgical History  Procedure Date  . Total abdominal hysterectomy   . Cataract extraction    History  Substance Use Topics  . Smoking status: Never Smoker   . Smokeless tobacco: Not on file  . Alcohol Use: No   Family History  Problem Relation Age of Onset  . Diabetes Mother   . Diabetes      Aunt  . Stroke      Aunt (had pacemaker)   Allergies  Allergen Reactions  . Ace Inhibitors     REACTION: COUGH  . Cyclobenzaprine Hcl     REACTION: diarrhea  . Flonase (Fluticasone Propionate)     nosebleed  . Ibuprofen     REACTION: vomiting  . NWG:NFAOZHYQMVH+QIONGEXBM+WUXLKGMWNU Acid+Aspartame     REACTION: diarrhea   Current Outpatient Prescriptions  on File Prior to Visit  Medication Sig Dispense Refill  . aspirin 81 MG tablet Take 81 mg by mouth daily.        Marland Kitchen atorvastatin (LIPITOR) 20 MG tablet Take 20 mg by mouth daily. 08-06-10 pt is finishing lipitor 20mg  and when finishes this last bottle will start on Pravastatin.       . Calcium Carbonate-Vitamin D (CALCIUM-VITAMIN D) 500-200 MG-UNIT per tablet Take 1 tablet by mouth 2 (two) times daily with a meal.        . diltiazem (CARDIZEM CD) 240 MG 24 hr capsule Take 240 mg by mouth daily.        . hydrochlorothiazide (,MICROZIDE/HYDRODIURIL,) 12.5 MG capsule Take 12.5 mg by mouth daily.        Marland Kitchen ketoconazole (NIZORAL) 2 % shampoo Apply 1 application topically 2 (two) times a week.        . metFORMIN (GLUCOPHAGE) 500 MG tablet Take 1 tablet (500 mg total) by mouth 2 (two) times daily with a meal.  30  tablet  11  . methylcellulose oral powder Take by mouth daily.        Marland Kitchen omeprazole (PRILOSEC) 20 MG capsule Take 20 mg by mouth daily.        . potassium chloride SA (KLOR-CON M20) 20 MEQ tablet Take 1 tablet (20 mEq total) by mouth daily.  30 tablet  11  . pravastatin (PRAVACHOL) 40 MG tablet Take 1 tablet (40 mg total) by mouth every evening.  30 tablet  11  . PROBIOTIC CAPS Take 1 capsule by mouth daily.        Marland Kitchen albuterol (VENTOLIN HFA) 108 (90 BASE) MCG/ACT inhaler Inhale 2 puffs into the lungs every 4 (four) hours as needed.  1 Inhaler  6  . Bismuth Subsalicylate (PEPTO-BISMOL MAX STRENGTH) 525 MG/15ML SUSP Take by mouth as directed.        . desloratadine (CLARINEX) 5 MG tablet Take 5 mg by mouth daily as needed. For allergies       . ketoconazole (NIZORAL) 2 % cream APPLY SMALL AMOUNT TO AFFECTED AREA TWICE DAILY AS NEEDED  30 g  1  . Pseudoephedrine-Guaifenesin (MUCINEX D PO) Take 1 tablet by mouth 2 (two) times daily as needed.        . Psyllium (METAMUCIL) 30.9 % POWD Take by mouth as directed.        . sodium chloride (OCEAN) 0.65 % nasal spray Place 1 spray into the nose as needed.        Marland Kitchen terconazole (TERAZOL 3) 0.8 % vaginal cream Apply to affected areas once daily    20 g  0       Review of Systems Review of Systems  Constitutional: Negative for fever, appetite change, fatigue and unexpected weight change.  Eyes: Negative for pain and visual disturbance.  Respiratory: Negative for cough and shortness of breath.   Cardiovascular: Negative for cp or palpitations    Gastrointestinal: Negative for nausea, diarrhea , neg for blood in stool , abd pain. Vomiting  Genitourinary: Negative for urgency and frequency.  Skin: Negative for pallor or rash   MSK pos for low back pain that is better today, no joint swelling  Neurological: Negative for weakness, light-headedness, numbness and headaches.  Hematological: Negative for adenopathy. Does not bruise/bleed easily.    Psychiatric/Behavioral: Negative for dysphoric mood. The patient is not nervous/anxious.          Objective:   Physical Exam  Constitutional: She  appears well-developed and well-nourished. No distress.       overwt and well appearing   HENT:  Head: Normocephalic and atraumatic.  Mouth/Throat: Oropharynx is clear and moist.  Eyes: Conjunctivae and EOM are normal. Pupils are equal, round, and reactive to light.  Neck: Normal range of motion. Neck supple.  Abdominal: Soft. Bowel sounds are normal. She exhibits no distension and no mass. There is no tenderness. There is no rebound and no guarding.  Genitourinary: Rectum normal. Rectal exam shows no external hemorrhoid, no fissure, no mass, no tenderness and anal tone normal. Guaiac negative stool.       Some injection of skin around anus without cracking or tenderness   Musculoskeletal: Normal range of motion. She exhibits no edema and no tenderness.       Nl LS tenderness Nl rom Neg slr bilat   Neurological: She is alert. She has normal reflexes. Coordination normal.  Skin: Skin is warm and dry. No erythema. No pallor.  Psychiatric: She has a normal mood and affect.          Assessment & Plan:

## 2010-10-09 NOTE — Telephone Encounter (Signed)
[facility] Triage Call Report Triage Record Num: [id number] Operator: Ether Griffins Patient Name: Linda Thompson Call Date & Time: 10/08/2010 8:17:16PM Patient Phone: [phone] PCP: Audrie Gallus. Tower Patient Gender: Female PCP Fax : Patient DOB: [birth year] Practice Name: [facility] Reason for Call: Calling about having burning pain when wiping after moving bowels.Saw MD for this pain before and advised to use baby wipes and a cream.Has BM daily.All emergent sxs of Rectal symptoms r/o. Advised to call office to schedule an appt within next 2 weeks.Home care advice given. Protocol(s) Used: Rectal Symptoms Recommended Outcome per Protocol: See Provider within 2 Weeks Reason for Outcome: Rectal itching or burning not responding to home care Care Advice: ~ Call provider if symptoms worsen or new symptoms develop. ~ SYMPTOM / CONDITION MANAGEMENT ~ CAUTIONS ~ Do not scratch or rub affected area to avoid further irritation. Gently clean rectal area after bowel movement using unscented moist flushable wipes, moistened toilet tissue, or cotton balls soaked in witch hazel rather than dry, perfumed or colored toilet tissue. Avoid use of any wipes containing alcohol. ~ 10/08/2010 8:30:45PM Page 1 of 1 CAN_TriageRpt_V2

## 2010-10-09 NOTE — Assessment & Plan Note (Signed)
Briefly had back pain - went to UC and nl ua  Gone now ? If related to strain Recommended if symptomsm return to fu with Dr Ophelia Charter her orthopedist for f/u (she has hx of DDD and injections in the past)

## 2010-10-09 NOTE — Patient Instructions (Signed)
Use the stool softener as directed with lots of water when you have constipation (stools that are difficult to pass)  Using baby wipes with aloe to clean after bowel movements are helpful also  Then dry the area well afterwards  Keep taking fiber every day- and eat fruits and vegetables  I gave you a handout on irritable bowel syndrome to read  If your back starts to hurt again- see Dr Ophelia Charter

## 2010-10-09 NOTE — Assessment & Plan Note (Signed)
Going between constipation and diarrhea This time constipation - with anal irritation but no bleeding Nl exam  Recommended when constipated to drink water and eat fruit and use otc stool softener as needed  Also use baby wipe with aloe to sooth anal area , then dry well  Update if not imp Will continue fiber Also given handout on IBS to read

## 2010-10-11 NOTE — Telephone Encounter (Signed)
Yes- then saw her in the office thanks

## 2010-10-11 NOTE — Telephone Encounter (Signed)
Dr. Tower, did you see this note? 

## 2010-10-25 ENCOUNTER — Ambulatory Visit (INDEPENDENT_AMBULATORY_CARE_PROVIDER_SITE_OTHER): Payer: Medicare Other | Admitting: Family Medicine

## 2010-10-25 ENCOUNTER — Ambulatory Visit: Payer: Medicare Other | Admitting: Family Medicine

## 2010-10-25 ENCOUNTER — Encounter: Payer: Self-pay | Admitting: Family Medicine

## 2010-10-25 VITALS — BP 122/80 | HR 82 | Temp 98.7°F | Ht 62.0 in | Wt 198.0 lb

## 2010-10-25 DIAGNOSIS — B369 Superficial mycosis, unspecified: Secondary | ICD-10-CM

## 2010-10-25 MED ORDER — NYSTATIN-TRIAMCINOLONE 100000-0.1 UNIT/GM-% EX OINT: TOPICAL_OINTMENT | Freq: Two times a day (BID) | CUTANEOUS | Status: DC

## 2010-10-25 MED ORDER — NYSTATIN 100000 UNIT/GM EX POWD: CUTANEOUS | Status: DC

## 2010-10-25 NOTE — Patient Instructions (Signed)
Good to see you. I want you to apply the new cream I sent in to the area twice daily and the powder on top of it up to three times a day as needed.

## 2010-10-25 NOTE — Progress Notes (Signed)
Subjective:    Patient ID: Linda Thompson, female    DOB: 01-Apr-1955, 55 y.o.   MRN: [id number]  HPI 55 yo pt of Dr. Milinda Antis here for ?yeast infection on leg.  Has been using topical Nizoral without relief.  Continues to spread, very painful this morning.  No rash elsewhere.  Not itchy. No n/v/d.  Patient Active Problem List  Diagnoses  . FUNGAL DERMATITIS  . MENINGIOMA  . DIABETES MELLITUS, TYPE II  . HYPERLIPIDEMIA  . MENTAL RETARDATION, MODERATE  . GLAUCOMA NOS  . HYPERTENSION  . ALLERGIC  RHINITIS  . GERD  . HIATAL HERNIA  . DERMATITIS, SEBORRHEIC NOS  . PRURITUS ANI  . OSTEOARTHRITIS, FOOT  . HEART MURMUR  . TRANSAMINASES, SERUM, ELEVATED  . Personal history of other musculoskeletal disorders  . IRRITABLE BOWEL SYNDROME  . ANKLE PAIN, LEFT  . Hypokalemia  . Back pain  . UTI (lower urinary tract infection)  . Vaginal itching   Past Medical History  Diagnosis Date  . Allergic rhinitis, cause unspecified   . Pain in joint, ankle and foot   . Backache, unspecified     chronic LBP with radiculopathy  . Seborrheic dermatitis, unspecified   . Type II or unspecified type diabetes mellitus without mention of complication, not stated as uncontrolled   . History of fracture of arm     Right  . History of fracture of foot   . Dermatomycosis, unspecified   . Esophageal reflux   . Unspecified glaucoma   . Undiagnosed cardiac murmurs   . Diaphragmatic hernia without mention of obstruction or gangrene   . Other and unspecified hyperlipidemia   . Unspecified essential hypertension   . Irritable bowel syndrome   . Benign neoplasm of cerebral meninges   . Moderate intellectual disabilities   . Nausea alone   . Osteoarthrosis, unspecified whether generalized or localized, ankle and foot   . Pruritus ani   . Unspecified tinnitus   . Nonspecific elevation of levels of transaminase or lactic acid dehydrogenase (LDH)   . Leukorrhea, not specified as infective   . Obesity    . Hyperglycemia   . DDD (degenerative disc disease), lumbar    Past Surgical History  Procedure Date  . Total abdominal hysterectomy   . Cataract extraction    History  Substance Use Topics  . Smoking status: Never Smoker   . Smokeless tobacco: Not on file  . Alcohol Use: No   Family History  Problem Relation Age of Onset  . Diabetes Mother   . Diabetes      Aunt  . Stroke      Aunt (had pacemaker)   Allergies  Allergen Reactions  . Ace Inhibitors     REACTION: COUGH  . Cyclobenzaprine Hcl     REACTION: diarrhea  . Flonase (Fluticasone Propionate)     nosebleed  . Ibuprofen     REACTION: vomiting  . OZH:YQMVHQIONGE+XBMWUXLKG+MWNUUVOZDG Acid+Aspartame     REACTION: diarrhea   Current Outpatient Prescriptions on File Prior to Visit  Medication Sig Dispense Refill  . albuterol (VENTOLIN HFA) 108 (90 BASE) MCG/ACT inhaler Inhale 2 puffs into the lungs every 4 (four) hours as needed.  1 Inhaler  6  . aspirin 81 MG tablet Take 81 mg by mouth daily.        Marland Kitchen atorvastatin (LIPITOR) 20 MG tablet Take 20 mg by mouth daily. 08-06-10 pt is finishing lipitor 20mg  and when finishes this last bottle will start on Pravastatin.       Marland Kitchen  Bismuth Subsalicylate (PEPTO-BISMOL MAX STRENGTH) 525 MG/15ML SUSP Take by mouth as directed.        . Calcium Carbonate-Vitamin D (CALCIUM-VITAMIN D) 500-200 MG-UNIT per tablet Take 1 tablet by mouth 2 (two) times daily with a meal.        . desloratadine (CLARINEX) 5 MG tablet Take 5 mg by mouth daily as needed. For allergies       . diltiazem (CARDIZEM CD) 240 MG 24 hr capsule Take 240 mg by mouth daily.        . hydrochlorothiazide (,MICROZIDE/HYDRODIURIL,) 12.5 MG capsule Take 12.5 mg by mouth daily.        Marland Kitchen ketoconazole (NIZORAL) 2 % cream APPLY SMALL AMOUNT TO AFFECTED AREA TWICE DAILY AS NEEDED  30 g  1  . ketoconazole (NIZORAL) 2 % shampoo Apply 1 application topically 2 (two) times a week.        . metFORMIN (GLUCOPHAGE) 500 MG tablet Take 1  tablet (500 mg total) by mouth 2 (two) times daily with a meal.  30 tablet  11  . methylcellulose oral powder Take by mouth daily.        Marland Kitchen omeprazole (PRILOSEC) 20 MG capsule Take 20 mg by mouth daily.        . potassium chloride SA (KLOR-CON M20) 20 MEQ tablet Take 1 tablet (20 mEq total) by mouth daily.  30 tablet  11  . pravastatin (PRAVACHOL) 40 MG tablet Take 1 tablet (40 mg total) by mouth every evening.  30 tablet  11  . PROBIOTIC CAPS Take 1 capsule by mouth daily.        . Pseudoephedrine-Guaifenesin (MUCINEX D PO) Take 1 tablet by mouth 2 (two) times daily as needed.        . Psyllium (METAMUCIL) 30.9 % POWD Take by mouth as directed.        . sodium chloride (OCEAN) 0.65 % nasal spray Place 1 spray into the nose as needed.        Marland Kitchen terconazole (TERAZOL 3) 0.8 % vaginal cream Apply to affected areas once daily    20 g  0   The PMH, PSH, Social History, Family History, Medications, and allergies have been reviewed in [facility], and have been updated if relevant.    Review of Systems See HPI    Objective:   Physical Exam BP 122/80  Pulse 82  Temp(Src) 98.7 F (37.1 C) (Oral)  Ht 5\' 2"  (1.575 m)  Wt 198 lb (89.812 kg)  BMI 36.21 kg/m2 Gen:  Alert, pleasant, mild MR (at baseline) GU:  No labial lesions, large area of erythema in inguinal intrigenous skin fold, left      Assessment & Plan:   1. FUNGAL DERMATITIS    Deteriorated. Will try nystatin- triamcinolone combination. If no improvement, also sent in rx for nystatin powder since it is an area with high moisture. The patient indicates understanding of these issues and agrees with the plan.

## 2010-10-26 ENCOUNTER — Inpatient Hospital Stay (INDEPENDENT_AMBULATORY_CARE_PROVIDER_SITE_OTHER)
Admission: RE | Admit: 2010-10-26 | Discharge: 2010-10-26 | Disposition: A | Discharge status: Home / Self Care | Payer: Medicare Other | Source: Ambulatory Visit | Attending: Emergency Medicine | Admitting: Emergency Medicine

## 2010-10-26 DIAGNOSIS — L270 Generalized skin eruption due to drugs and medicaments taken internally: Secondary | ICD-10-CM

## 2010-10-29 ENCOUNTER — Ambulatory Visit (INDEPENDENT_AMBULATORY_CARE_PROVIDER_SITE_OTHER): Payer: Medicare Other | Admitting: Family Medicine

## 2010-10-29 ENCOUNTER — Encounter: Payer: Self-pay | Admitting: Family Medicine

## 2010-10-29 DIAGNOSIS — Z888 Allergy status to other drugs, medicaments and biological substances status: Secondary | ICD-10-CM

## 2010-10-29 DIAGNOSIS — IMO0002 Reserved for concepts with insufficient information to code with codable children: Secondary | ICD-10-CM | POA: Insufficient documentation

## 2010-10-29 NOTE — Assessment & Plan Note (Addendum)
Discussed previous reaction to ibuprofen as likely intolerance. rec stop this, use tylenol as needed for back pain instead. rec discard ibuprofen tablets. As neck rash is improving, rec continue TCI, updtae [place] if questions or concerns.

## 2010-10-29 NOTE — Patient Instructions (Addendum)
I don't think the ibuprofen caused this rash.  Continue triamcinolone ointment if helping.  I'd recommend stopping ibuprofen. The reason we don't want you to take ibuprofen is because it can upset your stomach (caused vomiting in the past). Change to tylenol for back as needed.  I'm glad back is feeling better.

## 2010-10-29 NOTE — Progress Notes (Signed)
  Subjective:    Patient ID: Linda Thompson, female    DOB: 1955-06-17, 55 y.o.   MRN: [id number]  HPI CC: med question  Went to see Dr. Ophelia Charter last Tuesday for back/left leg pain.  He prescribed ibuprofen 800mg  tid, rec take with food.  Pt had taken for several days.  Helped pain.  Pharmacy put Dr. Royden Purl name by accident on script.  Pt called pharmacy and clarified Dr. Ophelia Charter not Dr. Milinda Antis has prescribed this.    H/o esophageal reflux, no known ulcer.  Saturday went to [facility] for itchy rash on back, brought all her meds and upon med rec there they realized ibuprofen was on allergy list.  Dx with drug eruption, placed on TCI ointment 1%.  She brings ibuprofen bottle today and asks what to do with it.  Otherwise feeling fine today.  Review of Systems Per HPI    Objective:   Physical Exam  Nursing note and vitals reviewed. Constitutional: She appears well-developed and well-nourished. No distress.  Skin: Skin is warm and dry. Rash noted.       Slightly erythematous scaly rash on nape of neck, improving.  Some pruritis.  Psychiatric: She has a normal mood and affect.          Assessment & Plan:

## 2010-11-01 ENCOUNTER — Inpatient Hospital Stay (INDEPENDENT_AMBULATORY_CARE_PROVIDER_SITE_OTHER)
Admission: RE | Admit: 2010-11-01 | Discharge: 2010-11-01 | Disposition: A | Discharge status: Home / Self Care | Payer: Medicare Other | Source: Ambulatory Visit | Attending: Family Medicine | Admitting: Family Medicine

## 2010-11-01 DIAGNOSIS — J069 Acute upper respiratory infection, unspecified: Secondary | ICD-10-CM

## 2010-11-04 ENCOUNTER — Telehealth: Payer: Self-pay | Admitting: *Deleted

## 2010-11-04 NOTE — Telephone Encounter (Signed)
[facility] Triage Call Report Triage Record Num: [id number] Operator: Alphonsa Overall Patient Name: Linda Thompson Call Date & Time: 10/31/2010 11:42:27PM Patient Phone: [phone] PCP: Audrie Gallus. Tower Patient Gender: Female PCP Fax : Patient DOB: 08/02/1975 Practice Name: [facility] Reason for Call: Hysterectomy pt. [name] calling about sore throat. Onset 10/31/10. Afebrile. Pt with scratchy throat. Pt with rash. Care advice given. [name] aware needs to f/u 11/01/10. Protocol(s) Used: Sore Throat or Hoarseness Recommended Outcome per Protocol: See Provider within 24 hours Reason for Outcome: Non-itchy, red skin rash Care Advice: ~ Use a cool mist humidifier to moisten air. Be sure to clean according to manufacturer's instructions. Limit or avoid exposure to irritants and allergens (e.g. air pollution, smoke/smoking, chemicals, dust, pollen, pet dander, etc.) ~ ~ SYMPTOM / CONDITION MANAGEMENT Most adults need to drink 6-10 eight-ounce glasses (1.2-2.0 liters) of fluids per day unless previously told to limit fluid intake for other medical reasons. Limit fluids that contain caffeine, sugar or alcohol. Urine will be a very light yellow color when you drink enough fluids. ~ Respiratory Hygiene: - Cover the nose/mouth tightly with a tissue when coughing or sneezing. - Use tissue 1 time and discard in the nearest waste receptacle. - Wash hands with soap and water or alcohol-based hand rub after coming into contact with respiratory secretions and contaminated objects/materials. - Alternatively when no tissue is available, cough into the bend of the elbow. - .Avoid touching your eyes, nose or mouth. ~ 10/31/2010 11:56:13PM Page 1 of 1 [email or web address]_[email or web address]

## 2010-11-04 NOTE — Telephone Encounter (Signed)
[facility] Triage Call Report Triage Record Num: [id number] Operator: Audelia Hives Patient Name: Linda Thompson Call Date & Time: 11/01/2010 12:00:21AM Patient Phone: [phone] PCP: Audrie Gallus. Tower Patient Gender: Female PCP Fax : Patient DOB: [birth year] Practice Name: [facility] Reason for Call: Margree calling regarding sore throat and feels scratchy, onset 10/31/10 at 1700. Was advised by her local pharmacy to gargle with warm salt water and taking sugar free cough drops. Afebrile. Emergent s/s for Sore-Throat r/o per protocol and home care advice given. Protocol(s) Used: Sore Throat or Hoarseness Recommended Outcome per Protocol: Provide Home/Self Care Reason for Outcome: Sore throat AND no other symptoms Care Advice: ~ SYMPTOM / CONDITION MANAGEMENT Most adults need to drink 6-10 eight-ounce glasses (1.2-2.0 liters) of fluids per day unless previously told to limit fluid intake for other medical reasons. Limit fluids that contain caffeine, sugar or alcohol. Urine will be a very light yellow color when you drink enough fluids. ~ Analgesic/Antipyretic Advice - Acetaminophen: Consider acetaminophen as directed on label or by pharmacist/provider for pain or fever PRECAUTIONS: - Use if there is no history of liver disease, alcoholism, or intake of three or more alcohol drinks per day - Only if approved by provider during pregnancy or when breastfeeding - During pregnancy, acetaminophen should not be taken more than 3 consecutive days without telling provider - Do not exceed recommended dose or frequency ~ Sore Throat Relief: - Use warm salt water gargles 3 to 4 times/day, as needed (1/2 tsp. salt in 8 oz. [.2 liters] water). - Suck on hard candy, nonprescription or herbal throat lozenges (sugar-free if diabetic) - Eat soothing, soft food/fluids (broths, soups, or honey and lemon juice in hot tea, Popsicles, frozen yogurt or sherbet, scrambled eggs, cooked cereals,  Jell-O or puddings) whichever is most comforting. - Avoid eating salty, spicy or acidic foods. ~ 11/01/2010 12:14:17AM Page 1 of 1 [email or web address]_[email or web address]_V2

## 2010-11-04 NOTE — Telephone Encounter (Signed)
Aware - does not have appt yet

## 2010-11-05 ENCOUNTER — Ambulatory Visit (INDEPENDENT_AMBULATORY_CARE_PROVIDER_SITE_OTHER): Payer: Medicare Other | Admitting: Family Medicine

## 2010-11-05 ENCOUNTER — Encounter: Payer: Self-pay | Admitting: Family Medicine

## 2010-11-05 VITALS — BP 126/84 | HR 88 | Temp 97.7°F | Ht 62.0 in | Wt 198.8 lb

## 2010-11-05 DIAGNOSIS — J069 Acute upper respiratory infection, unspecified: Secondary | ICD-10-CM

## 2010-11-05 DIAGNOSIS — I10 Essential (primary) hypertension: Secondary | ICD-10-CM

## 2010-11-05 NOTE — Progress Notes (Signed)
Subjective:    Patient ID: Linda Thompson, female    DOB: 12/06/1955, 55 y.o.   MRN: [id number]  HPI Here for f/u of UC visit on 10/12- was dx with uri and given zpak and mucinex  Is feeling better overall but still having a non productive cough  Initially started with ST - and never had a fever  She was told to f/u with her primary   bp was up some in UC- because she was not feeling well Is better today 126/84 No ha or edema or cp - that is ok now   No stuffy nose or ears Throat is improved  Cough is mild   Patient Active Problem List  Diagnoses  . FUNGAL DERMATITIS  . MENINGIOMA  . DIABETES MELLITUS, TYPE II  . HYPERLIPIDEMIA  . MENTAL RETARDATION, MODERATE  . GLAUCOMA NOS  . HYPERTENSION  . ALLERGIC  RHINITIS  . GERD  . HIATAL HERNIA  . DERMATITIS, SEBORRHEIC NOS  . PRURITUS ANI  . OSTEOARTHRITIS, FOOT  . HEART MURMUR  . TRANSAMINASES, SERUM, ELEVATED  . Personal history of other musculoskeletal disorders  . IRRITABLE BOWEL SYNDROME  . ANKLE PAIN, LEFT  . Hypokalemia  . Back pain  . UTI (lower urinary tract infection)  . Vaginal itching  . Allergy or intolerance to drug  . URI (upper respiratory infection)   Past Medical History  Diagnosis Date  . Allergic rhinitis, cause unspecified   . Pain in joint, ankle and foot   . Backache, unspecified     chronic LBP with radiculopathy  . Seborrheic dermatitis, unspecified   . Type II or unspecified type diabetes mellitus without mention of complication, not stated as uncontrolled   . History of fracture of arm     Right  . History of fracture of foot   . Dermatomycosis, unspecified   . Esophageal reflux   . Unspecified glaucoma   . Undiagnosed cardiac murmurs   . Diaphragmatic hernia without mention of obstruction or gangrene   . Other and unspecified hyperlipidemia   . Unspecified essential hypertension   . Irritable bowel syndrome   . Benign neoplasm of cerebral meninges   . Moderate intellectual  disabilities   . Nausea alone   . Osteoarthrosis, unspecified whether generalized or localized, ankle and foot   . Pruritus ani   . Unspecified tinnitus   . Nonspecific elevation of levels of transaminase or lactic acid dehydrogenase (LDH)   . Leukorrhea, not specified as infective   . Obesity   . Hyperglycemia   . DDD (degenerative disc disease), lumbar    Past Surgical History  Procedure Date  . Total abdominal hysterectomy   . Cataract extraction    History  Substance Use Topics  . Smoking status: Never Smoker   . Smokeless tobacco: Not on file  . Alcohol Use: No   Family History  Problem Relation Age of Onset  . Diabetes Mother   . Diabetes      Aunt  . Stroke      Aunt (had pacemaker)   Allergies  Allergen Reactions  . Ace Inhibitors     REACTION: COUGH  . Cyclobenzaprine Hcl     REACTION: diarrhea  . Flonase (Fluticasone Propionate)     nosebleed  . Ibuprofen     REACTION: vomiting  . BMW:[id number] Acid+Aspartame     REACTION: diarrhea   Current Outpatient Prescriptions on File Prior to Visit  Medication Sig Dispense Refill  . aspirin 81 MG  tablet Take 81 mg by mouth daily.        . Calcium Carbonate-Vitamin D (CALCIUM-VITAMIN D) 500-200 MG-UNIT per tablet Take 1 tablet by mouth 2 (two) times daily with a meal.        . diltiazem (CARDIZEM CD) 240 MG 24 hr capsule Take 240 mg by mouth daily.        . hydrochlorothiazide (,MICROZIDE/HYDRODIURIL,) 12.5 MG capsule Take 12.5 mg by mouth daily.        Marland Kitchen ketoconazole (NIZORAL) 2 % shampoo Apply 1 application topically 2 (two) times a week.        . metFORMIN (GLUCOPHAGE) 500 MG tablet Take 1 tablet (500 mg total) by mouth 2 (two) times daily with a meal.  30 tablet  11  . nystatin-triamcinolone (MYCOLOG) ointment Apply topically 2 (two) times daily.  30 g  0  . omeprazole (PRILOSEC) 20 MG capsule Take 20 mg by mouth daily.        . potassium chloride SA (KLOR-CON M20) 20 MEQ tablet Take 1  tablet (20 mEq total) by mouth daily.  30 tablet  11  . pravastatin (PRAVACHOL) 40 MG tablet Take 1 tablet (40 mg total) by mouth every evening.  30 tablet  11  . PROBIOTIC CAPS Take 1 capsule by mouth daily.        Marland Kitchen albuterol (VENTOLIN HFA) 108 (90 BASE) MCG/ACT inhaler Inhale 2 puffs into the lungs every 4 (four) hours as needed.  1 Inhaler  6  . Bismuth Subsalicylate (PEPTO-BISMOL MAX STRENGTH) 525 MG/15ML SUSP Take by mouth as directed.        . desloratadine (CLARINEX) 5 MG tablet Take 5 mg by mouth daily as needed. For allergies       . ibuprofen (ADVIL,MOTRIN) 800 MG tablet Take 800 mg by mouth 2 (two) times daily as needed.        Marland Kitchen ketoconazole (NIZORAL) 2 % cream APPLY SMALL AMOUNT TO AFFECTED AREA TWICE DAILY AS NEEDED  30 g  1  . methylcellulose oral powder Take by mouth daily.        Marland Kitchen nystatin (MYCOSTATIN) powder Apply to affected area 3 times daily  15 g  0  . Pseudoephedrine-Guaifenesin (MUCINEX D PO) Take 1 tablet by mouth 2 (two) times daily as needed.        . Psyllium (METAMUCIL) 30.9 % POWD Take by mouth as directed.        . sodium chloride (OCEAN) 0.65 % nasal spray Place 1 spray into the nose as needed.        Marland Kitchen terconazole (TERAZOL 3) 0.8 % vaginal cream Apply to affected areas once daily    20 g  0      Review of Systems Review of Systems  Constitutional: Negative for fever, appetite change, fatigue and unexpected weight change.  Eyes: Negative for pain and visual disturbance.  ENT neg for ear pain or st or sinus pain  Respiratory: Negative for shortness of breath.  Pos for mild cough that is improving Cardiovascular: Negative for cp or palpitations    Gastrointestinal: Negative for nausea, diarrhea and constipation.  Genitourinary: Negative for urgency and frequency.  Skin: Negative for pallor or rash   Neurological: Negative for weakness, light-headedness, numbness and headaches.  Hematological: Negative for adenopathy. Does not bruise/bleed easily.    Psychiatric/Behavioral: Negative for dysphoric mood. The patient is not nervous/anxious.          Objective:   Physical Exam  Constitutional: She  appears well-developed and well-nourished. No distress.       overwt and well appearing   HENT:  Head: Normocephalic and atraumatic.  Right Ear: External ear normal.  Left Ear: External ear normal.  Mouth/Throat: Oropharynx is clear and moist.       Nares are injected and congested  Some clear post nasal drip  Eyes: Conjunctivae and EOM are normal. Pupils are equal, round, and reactive to light. Right eye exhibits no discharge. Left eye exhibits no discharge.  Neck: Normal range of motion. Neck supple. No JVD present. No thyromegaly present.  Cardiovascular: Normal rate, regular rhythm, normal heart sounds and intact distal pulses.   Pulmonary/Chest: Effort normal and breath sounds normal. No respiratory distress. She has no wheezes. She has no rales. She exhibits no tenderness.       occ dry cough  Abdominal: Soft. Bowel sounds are normal. She exhibits no distension. There is no tenderness.  Lymphadenopathy:    She has no cervical adenopathy.  Neurological: She is alert.  Skin: Skin is warm and dry. No rash noted. No pallor.  Psychiatric: She has a normal mood and affect.       Baseline MR Repeats things over and over as usual Quite pleasant           Assessment & Plan:

## 2010-11-05 NOTE — Assessment & Plan Note (Signed)
Mild and with persistant cough , but improved after course of zithromax Adv to continue mucinex DM as inst See inst- sympt care/ fluids Update if worse or not improving

## 2010-11-05 NOTE — Patient Instructions (Signed)
I think your upper respiratory infection that is getting better Take mucinex DM as needed for cough  The cough will likely last another-2 weeks  If it becomes worse or fever or other symptoms-please call

## 2010-11-07 NOTE — Assessment & Plan Note (Signed)
Was up in urgent care - but back to nl today Poss was because she did not feel well and was nervous/coughing

## 2010-11-15 ENCOUNTER — Encounter: Payer: Self-pay | Admitting: Family Medicine

## 2010-11-15 ENCOUNTER — Ambulatory Visit (INDEPENDENT_AMBULATORY_CARE_PROVIDER_SITE_OTHER): Payer: Medicare Other | Admitting: Family Medicine

## 2010-11-15 VITALS — BP 124/72 | HR 90 | Temp 98.7°F | Ht 62.0 in | Wt 200.0 lb

## 2010-11-15 DIAGNOSIS — B369 Superficial mycosis, unspecified: Secondary | ICD-10-CM

## 2010-11-15 DIAGNOSIS — B372 Candidiasis of skin and nail: Secondary | ICD-10-CM

## 2010-11-15 MED ORDER — NYSTATIN-TRIAMCINOLONE 100000-0.1 UNIT/GM-% EX OINT: TOPICAL_OINTMENT | Freq: Two times a day (BID) | CUTANEOUS | Status: DC

## 2010-11-15 MED ORDER — FLUCONAZOLE 50 MG PO TABS: 50.0000 mg | ORAL_TABLET | Freq: Every day | ORAL | Status: DC

## 2010-11-15 MED ORDER — NYSTATIN 100000 UNIT/GM EX POWD: CUTANEOUS | Status: DC

## 2010-11-15 NOTE — Progress Notes (Signed)
Subjective:    Patient ID: Linda Thompson, female    DOB: 31-Aug-1955, 55 y.o.   MRN: [id number]  HPI 55 yo pt of Dr. Milinda Antis here for persistent fungal infection on her leg.  I saw her on 10/5 and gave rx for nystatin- triamcinolone combination. and nystatin powder since it is an area with high moisture.  It has a cleared a little but still very irritated and red.  No n/v/d. No fevers.  Patient Active Problem List  Diagnoses  . FUNGAL DERMATITIS  . MENINGIOMA  . DIABETES MELLITUS, TYPE II  . HYPERLIPIDEMIA  . MENTAL RETARDATION, MODERATE  . GLAUCOMA NOS  . HYPERTENSION  . ALLERGIC  RHINITIS  . GERD  . HIATAL HERNIA  . DERMATITIS, SEBORRHEIC NOS  . PRURITUS ANI  . OSTEOARTHRITIS, FOOT  . HEART MURMUR  . TRANSAMINASES, SERUM, ELEVATED  . Personal history of other musculoskeletal disorders  . IRRITABLE BOWEL SYNDROME  . ANKLE PAIN, LEFT  . Hypokalemia  . Back pain  . Allergy or intolerance to drug  . URI (upper respiratory infection)   Past Medical History  Diagnosis Date  . Allergic rhinitis, cause unspecified   . Pain in joint, ankle and foot   . Backache, unspecified     chronic LBP with radiculopathy  . Seborrheic dermatitis, unspecified   . Type II or unspecified type diabetes mellitus without mention of complication, not stated as uncontrolled   . History of fracture of arm     Right  . History of fracture of foot   . Dermatomycosis, unspecified   . Esophageal reflux   . Unspecified glaucoma   . Undiagnosed cardiac murmurs   . Diaphragmatic hernia without mention of obstruction or gangrene   . Other and unspecified hyperlipidemia   . Unspecified essential hypertension   . Irritable bowel syndrome   . Benign neoplasm of cerebral meninges   . Moderate intellectual disabilities   . Nausea alone   . Osteoarthrosis, unspecified whether generalized or localized, ankle and foot   . Pruritus ani   . Unspecified tinnitus   . Nonspecific elevation of levels of  transaminase or lactic acid dehydrogenase (LDH)   . Leukorrhea, not specified as infective   . Obesity   . Hyperglycemia   . DDD (degenerative disc disease), lumbar    Past Surgical History  Procedure Date  . Total abdominal hysterectomy   . Cataract extraction    History  Substance Use Topics  . Smoking status: Never Smoker   . Smokeless tobacco: Not on file  . Alcohol Use: No   Family History  Problem Relation Age of Onset  . Diabetes Mother   . Diabetes      Aunt  . Stroke      Aunt (had pacemaker)   Allergies  Allergen Reactions  . Ace Inhibitors     REACTION: COUGH  . Cyclobenzaprine Hcl     REACTION: diarrhea  . Flonase (Fluticasone Propionate)     nosebleed  . Ibuprofen     REACTION: vomiting  . FAO:[id number] Acid+Aspartame     REACTION: diarrhea   Current Outpatient Prescriptions on File Prior to Visit  Medication Sig Dispense Refill  . albuterol (VENTOLIN HFA) 108 (90 BASE) MCG/ACT inhaler Inhale 2 puffs into the lungs every 4 (four) hours as needed.  1 Inhaler  6  . aspirin 81 MG tablet Take 81 mg by mouth daily.        . Bismuth Subsalicylate (PEPTO-BISMOL MAX STRENGTH) 525  MG/15ML SUSP Take by mouth as directed.        . Calcium Carbonate-Vitamin D (CALCIUM-VITAMIN D) 500-200 MG-UNIT per tablet Take 1 tablet by mouth 2 (two) times daily with a meal.        . desloratadine (CLARINEX) 5 MG tablet Take 5 mg by mouth daily as needed. For allergies       . dextromethorphan-guaiFENesin (MUCINEX DM) 30-600 MG per 12 hr tablet Take 1 tablet by mouth every 12 (twelve) hours.        Marland Kitchen diltiazem (CARDIZEM CD) 240 MG 24 hr capsule Take 240 mg by mouth daily.        . hydrochlorothiazide (,MICROZIDE/HYDRODIURIL,) 12.5 MG capsule Take 12.5 mg by mouth daily.        Marland Kitchen ibuprofen (ADVIL,MOTRIN) 800 MG tablet Take 800 mg by mouth 2 (two) times daily as needed.        Marland Kitchen ketoconazole (NIZORAL) 2 % cream APPLY SMALL AMOUNT TO AFFECTED AREA TWICE DAILY AS  NEEDED  30 g  1  . ketoconazole (NIZORAL) 2 % shampoo Apply 1 application topically 2 (two) times a week.        . metFORMIN (GLUCOPHAGE) 500 MG tablet Take 1 tablet (500 mg total) by mouth 2 (two) times daily with a meal.  30 tablet  11  . methylcellulose oral powder Take by mouth daily.        Marland Kitchen nystatin (MYCOSTATIN) powder Apply to affected area 3 times daily  15 g  0  . nystatin-triamcinolone (MYCOLOG) ointment Apply topically 2 (two) times daily.  30 g  0  . omeprazole (PRILOSEC) 20 MG capsule Take 20 mg by mouth daily.        . potassium chloride SA (KLOR-CON M20) 20 MEQ tablet Take 1 tablet (20 mEq total) by mouth daily.  30 tablet  11  . pravastatin (PRAVACHOL) 40 MG tablet Take 1 tablet (40 mg total) by mouth every evening.  30 tablet  11  . PROBIOTIC CAPS Take 1 capsule by mouth daily.        . Pseudoephedrine-Guaifenesin (MUCINEX D PO) Take 1 tablet by mouth 2 (two) times daily as needed.        . Psyllium (METAMUCIL) 30.9 % POWD Take by mouth as directed.        . sodium chloride (OCEAN) 0.65 % nasal spray Place 1 spray into the nose as needed.        Marland Kitchen terconazole (TERAZOL 3) 0.8 % vaginal cream Apply to affected areas once daily    20 g  0   The PMH, PSH, Social History, Family History, Medications, and allergies have been reviewed in [facility], and have been updated if relevant.    Review of Systems See HPI    Objective:   Physical Exam BP 124/72  Pulse 90  Temp(Src) 98.7 F (37.1 C) (Oral)  Ht 5\' 2"  (1.575 m)  Wt 200 lb (90.719 kg)  BMI 36.58 kg/m2 Gen:  Alert, pleasant, mild MR (at baseline) GU:  No labial lesions, large area of erythema in inguinal intrigenous skin fold, left improved but still erythematous.     Assessment & Plan:    .1 Candidal intertrigo   Improved but still rather severe. Continue try nystatin- triamcinolone combination. Will add oral fluconazole 50 mg daily x 2 weeks.  The patient indicates understanding of these issues and agrees with the  plan.

## 2010-11-19 ENCOUNTER — Encounter: Payer: Self-pay | Admitting: Family Medicine

## 2010-11-19 ENCOUNTER — Ambulatory Visit (INDEPENDENT_AMBULATORY_CARE_PROVIDER_SITE_OTHER): Payer: Medicare Other | Admitting: Family Medicine

## 2010-11-19 VITALS — BP 134/82 | HR 88 | Temp 97.7°F | Wt 200.4 lb

## 2010-11-19 DIAGNOSIS — J069 Acute upper respiratory infection, unspecified: Secondary | ICD-10-CM

## 2010-11-19 MED ORDER — DM-GUAIFENESIN ER 30-600 MG PO TB12: 1.0000 | ORAL_TABLET | Freq: Two times a day (BID) | ORAL | Status: DC | PRN

## 2010-11-19 NOTE — Assessment & Plan Note (Signed)
Likely post-viral cough. Lungs clear today.   Anticipate mild pharyngitis from viral infection, no exudates, fever or LAD today. Continue to monitor. Update [place] if not improving as expected.

## 2010-11-19 NOTE — Progress Notes (Signed)
  Subjective:    Patient ID: Linda Thompson, female    DOB: 05/31/1955, 55 y.o.   MRN: [id number]  HPI CC: cough  Saw 11/05/2010 by PCP for f/u viral URTI seen at [facility] and treated with zpack and mucinex DM.  Plan was to continue mucinex dm.  Has not had fever.  Did finish zpack.  Still with mild dry cough, now throat aching "feels like my tonsils are hanging low".  No abd pain, n/v/d, headache.  Eating normal, stooling normal, voiding normal.  Review of Systems Per HPI    Objective:   Physical Exam  Nursing note and vitals reviewed. Constitutional: She appears well-developed and well-nourished. No distress.  HENT:  Head: Normocephalic and atraumatic.  Right Ear: Hearing, tympanic membrane, external ear and ear canal normal.  Left Ear: Hearing, tympanic membrane, external ear and ear canal normal.  Nose: Nose normal. No mucosal edema or rhinorrhea.  Mouth/Throat: Uvula is midline, oropharynx is clear and moist and mucous membranes are normal. No oropharyngeal exudate, posterior oropharyngeal edema, posterior oropharyngeal erythema or tonsillar abscesses.  Eyes: Conjunctivae and EOM are normal. Pupils are equal, round, and reactive to light. No scleral icterus.       S/p bilat cataract surgery  Neck: Normal range of motion. Neck supple.  Cardiovascular: Normal rate, regular rhythm, normal heart sounds and intact distal pulses.   No murmur heard. Pulmonary/Chest: Effort normal and breath sounds normal. No respiratory distress. She has no wheezes. She has no rales. She exhibits no tenderness.  Lymphadenopathy:    She has no cervical adenopathy.  Skin: Skin is warm and dry. No rash noted.      Assessment & Plan:

## 2010-11-19 NOTE — Patient Instructions (Addendum)
If it's helping with the cough, you may continue mucinex DM with plenty of fluid. Refill sent in. May use tylenol for throat discomfort as needed, tylenol 500mg  up to three times daily.  May do salt water gargles. i think your cough should get better with time.   Please watch for fevers >101.5, worsening cough or shortness of breath.  If that happens, please return.

## 2010-11-29 ENCOUNTER — Encounter: Payer: Self-pay | Admitting: Family Medicine

## 2010-11-29 ENCOUNTER — Ambulatory Visit (INDEPENDENT_AMBULATORY_CARE_PROVIDER_SITE_OTHER): Payer: Medicare Other | Admitting: Family Medicine

## 2010-11-29 VITALS — BP 126/84 | HR 88 | Temp 97.9°F | Ht 62.0 in | Wt 200.0 lb

## 2010-11-29 DIAGNOSIS — K589 Irritable bowel syndrome without diarrhea: Secondary | ICD-10-CM

## 2010-11-29 NOTE — Patient Instructions (Addendum)
For healthy bowels eat yogurt regularly Also take the fiber supplement every day  Continue your other medicines If you have a day you will be out and away from a bathroom- you can occasionally use immodium over the counter as directed If diarrhea worsens or if abdominal cramping/fever or other symptoms - please call and let me know

## 2010-11-29 NOTE — Progress Notes (Signed)
Subjective:    Patient ID: Linda Thompson, female    DOB: [birth year], 55 y.o.   MRN: [id number]  HPI Bowels have been moving more than they usually do  Has a BM at least 3-4 times per day  Not really watery - but loose  No abdominal cramping No constipation  No blood in stool Had colonosc in past No anal irritation in general - occ itching (has chronic pruritis ani)- the baby wipes really help that  Has not been taking anything for it  Is taking fiber every day-- either citrucel or metamucil    Used fluconazole for yeast infection- ? If that made it worse  Yeast is gone   Patient Active Problem List  Diagnoses  . FUNGAL DERMATITIS  . MENINGIOMA  . DIABETES MELLITUS, TYPE II  . HYPERLIPIDEMIA  . MENTAL RETARDATION, MODERATE  . GLAUCOMA NOS  . HYPERTENSION  . ALLERGIC  RHINITIS  . GERD  . HIATAL HERNIA  . DERMATITIS, SEBORRHEIC NOS  . PRURITUS ANI  . OSTEOARTHRITIS, FOOT  . HEART MURMUR  . TRANSAMINASES, SERUM, ELEVATED  . Personal history of other musculoskeletal disorders  . IRRITABLE BOWEL SYNDROME  . ANKLE PAIN, LEFT  . Hypokalemia  . Back pain  . Allergy or intolerance to drug  . URI (upper respiratory infection)  . Candidal intertrigo   Past Medical History  Diagnosis Date  . Allergic rhinitis, cause unspecified   . Pain in joint, ankle and foot   . Backache, unspecified     chronic LBP with radiculopathy  . Seborrheic dermatitis, unspecified   . Type II or unspecified type diabetes mellitus without mention of complication, not stated as uncontrolled   . History of fracture of arm     Right  . History of fracture of foot   . Dermatomycosis, unspecified   . Esophageal reflux   . Unspecified glaucoma   . Undiagnosed cardiac murmurs   . Diaphragmatic hernia without mention of obstruction or gangrene   . Other and unspecified hyperlipidemia   . Unspecified essential hypertension   . Irritable bowel syndrome   . Benign neoplasm of cerebral meninges     . Moderate intellectual disabilities   . Nausea alone   . Osteoarthrosis, unspecified whether generalized or localized, ankle and foot   . Pruritus ani   . Unspecified tinnitus   . Nonspecific elevation of levels of transaminase or lactic acid dehydrogenase (LDH)   . Leukorrhea, not specified as infective   . Obesity   . Hyperglycemia   . DDD (degenerative disc disease), lumbar    Past Surgical History  Procedure Date  . Total abdominal hysterectomy   . Cataract extraction    History  Substance Use Topics  . Smoking status: Never Smoker   . Smokeless tobacco: Not on file  . Alcohol Use: No   Family History  Problem Relation Age of Onset  . Diabetes Mother   . Diabetes      Aunt  . Stroke      Aunt (had pacemaker)   Allergies  Allergen Reactions  . Ace Inhibitors     REACTION: COUGH  . Cyclobenzaprine Hcl     REACTION: diarrhea  . Flonase (Fluticasone Propionate)     nosebleed  . Ibuprofen     REACTION: vomiting  . WUJ:WJXBJYNWGNF+AOZHYQMVH+QIONGEXBMW Acid+Aspartame     REACTION: diarrhea   Current Outpatient Prescriptions on File Prior to Visit  Medication Sig Dispense Refill  . aspirin 81 MG tablet Take 81  mg by mouth daily.        . Calcium Carbonate-Vitamin D (CALCIUM-VITAMIN D) 500-200 MG-UNIT per tablet Take 1 tablet by mouth 2 (two) times daily with a meal.        . diltiazem (CARDIZEM CD) 240 MG 24 hr capsule Take 240 mg by mouth daily.        . hydrochlorothiazide (,MICROZIDE/HYDRODIURIL,) 12.5 MG capsule Take 12.5 mg by mouth daily.        Marland Kitchen ketoconazole (NIZORAL) 2 % shampoo Apply 1 application topically 2 (two) times a week.        . metFORMIN (GLUCOPHAGE) 500 MG tablet Take 1 tablet (500 mg total) by mouth 2 (two) times daily with a meal.  30 tablet  11  . omeprazole (PRILOSEC) 20 MG capsule Take 20 mg by mouth daily.        . potassium chloride SA (KLOR-CON M20) 20 MEQ tablet Take 1 tablet (20 mEq total) by mouth daily.  30 tablet  11  . pravastatin  (PRAVACHOL) 40 MG tablet Take 1 tablet (40 mg total) by mouth every evening.  30 tablet  11  . PROBIOTIC CAPS Take 1 capsule by mouth daily.        . Psyllium (METAMUCIL) 30.9 % POWD Take by mouth as directed.        Marland Kitchen albuterol (VENTOLIN HFA) 108 (90 BASE) MCG/ACT inhaler Inhale 2 puffs into the lungs every 4 (four) hours as needed.  1 Inhaler  6  . Bismuth Subsalicylate (PEPTO-BISMOL MAX STRENGTH) 525 MG/15ML SUSP Take by mouth as directed.        . desloratadine (CLARINEX) 5 MG tablet Take 5 mg by mouth daily as needed. For allergies       . dextromethorphan-guaiFENesin (MUCINEX DM) 30-600 MG per 12 hr tablet Take 1 tablet by mouth 2 (two) times daily as needed (cough).  30 tablet  0  . ibuprofen (ADVIL,MOTRIN) 800 MG tablet Take 800 mg by mouth 2 (two) times daily as needed.        Marland Kitchen ketoconazole (NIZORAL) 2 % cream APPLY SMALL AMOUNT TO AFFECTED AREA TWICE DAILY AS NEEDED  30 g  1  . methylcellulose oral powder Take by mouth daily.        Marland Kitchen nystatin (MYCOSTATIN) powder Apply to affected area 3 times daily  15 g  0  . nystatin-triamcinolone (MYCOLOG) ointment Apply topically 2 (two) times daily.  30 g  0  . sodium chloride (OCEAN) 0.65 % nasal spray Place 1 spray into the nose as needed.        Marland Kitchen terconazole (TERAZOL 3) 0.8 % vaginal cream Apply to affected areas once daily    20 g  0      Review of Systems Review of Systems  Constitutional: Negative for fever, appetite change, fatigue and unexpected weight change.  Eyes: Negative for pain and visual disturbance.  Respiratory: Negative for cough and shortness of breath.   Cardiovascular: Negative for cp or palpitations    Gastrointestinal: Negative for nausea, pos for diarrhea and occ constipation, neg for abd pain or incontinence or blood in stool.  Genitourinary: Negative for urgency and frequency.  Skin: Negative for pallor or rash   Neurological: Negative for weakness, light-headedness, numbness and headaches.  Hematological:  Negative for adenopathy. Does not bruise/bleed easily.  Psychiatric/Behavioral: Negative for dysphoric mood. The patient is not nervous/anxious.          Objective:   Physical Exam  Constitutional: She appears  well-developed and well-nourished. No distress.       Obese and well appearing   HENT:  Head: Normocephalic and atraumatic.  Mouth/Throat: Oropharynx is clear and moist.  Eyes: Conjunctivae and EOM are normal. Pupils are equal, round, and reactive to light. No scleral icterus.  Neck: Normal range of motion. Neck supple. No JVD present. Carotid bruit is not present. No thyromegaly present.  Cardiovascular: Normal rate, regular rhythm, normal heart sounds and intact distal pulses.   Pulmonary/Chest: Effort normal and breath sounds normal. No respiratory distress. She has no wheezes.  Abdominal: Soft. Bowel sounds are normal. She exhibits no distension, no abdominal bruit and no mass. There is no hepatosplenomegaly. There is no tenderness. There is no rebound, no guarding and negative Murphy's sign.  Musculoskeletal: Normal range of motion. She exhibits no edema.  Lymphadenopathy:    She has no cervical adenopathy.  Neurological: She is alert. Coordination normal.       No tremor   Skin: Skin is warm and dry. No rash noted. No erythema. No pallor.  Psychiatric:       Baseline MR- repeats herself frequently          Assessment & Plan:

## 2010-11-29 NOTE — Assessment & Plan Note (Signed)
Pt now having more loose / frequent stool since she is back on metformin We agreed with IBS that this is preferable to constipation  No pain or bleeding or other symptoms Disc red flags to watch for  Disc keeping clean to avoid anal itching  Adv to continue fiber supplement EVERY DAY Also yogurt for probiotics Handouts given Adv to update if worse

## 2010-12-04 ENCOUNTER — Ambulatory Visit (INDEPENDENT_AMBULATORY_CARE_PROVIDER_SITE_OTHER): Payer: Medicare Other | Admitting: Family Medicine

## 2010-12-04 ENCOUNTER — Encounter: Payer: Self-pay | Admitting: Family Medicine

## 2010-12-04 DIAGNOSIS — R079 Chest pain, unspecified: Secondary | ICD-10-CM

## 2010-12-04 DIAGNOSIS — M940 Chondrocostal junction syndrome [Tietze]: Secondary | ICD-10-CM

## 2010-12-04 NOTE — Patient Instructions (Addendum)
Sounds like you have costochondritis - treat with tylenol 500mg  up to three times a day as needed. Alternate ice/heat pad to chest - then may stick with whichever soothes better. udpate [place] if not improving as expected.  Costochondritis Costochondritis (Tietze syndrome), or costochondral separation, is a swelling and irritation (inflammation) of the tissue (cartilage) that connects your ribs with your breastbone (sternum). It may occur on its own (spontaneously), through damage caused by an accident (trauma), or simply from coughing or minor exercise. It may take up to 6 weeks to get better and longer if you are unable to be conservative in your activities. HOME CARE INSTRUCTIONS   Avoid exhausting physical activity. Try not to strain your ribs during normal activity. This would include any activities using chest, belly (abdominal), and side muscles, especially if heavy weights are used.   Use ice for 15 to 20 minutes per hour while awake for the first 2 days. Place the ice in a plastic bag, and place a towel between the bag of ice and your skin.   Only take over-the-counter or prescription medicines for pain, discomfort, or fever as directed by your caregiver.  SEEK IMMEDIATE MEDICAL CARE IF:   Your pain increases or you are very uncomfortable.   You have a fever.   You develop difficulty with your breathing.   You cough up blood.   You develop worse chest pains, shortness of breath, sweating, or vomiting.   You develop new, unexplained problems (symptoms).  MAKE SURE YOU:   Understand these instructions.   Will watch your condition.   Will get help right away if you are not doing well or get worse.  Document Released: 10/16/2004 Document Revised: 09/18/2010 Document Reviewed: 08/25/2007 [facility] Patient Information 2012 [place], [place].

## 2010-12-04 NOTE — Assessment & Plan Note (Addendum)
Story and exam consistent with this. Cannot tolerate NSAIDs. rec tylenol and ice/heat alternating for chest discomfort. Update [place] if not improving.  does have cardiac risk factors in form of fmhx, and personal hx HTN, DM, HLD (although all well controlled). Will obtain EKG today mainly to obtain a baseline for chart as none in system EKG - NSR 87, nl axis, normal intervals, no acute ST/T changes.  Poor r wave progression with decreased voltage lateral leads, no old to compare.  Would just monitor for now.  Will route to PCP as fyi.

## 2010-12-04 NOTE — Progress Notes (Signed)
  Subjective:    Patient ID: Linda Thompson, female    DOB: 1956-01-05, 55 y.o.   MRN: [id number]  HPI CC: chest pain  Noticed left chest discomfort this morning, sudden, lasted a few seconds, has not come back.  Noticed more with twisting/bending over.  Felt sore/achey.  No pressure/tightness, sharp stabbing pains.  Denies pain radiating down arm or up neck.  Has not tried anything for this so far.  Thinks pushing on chest made pain worse.  Has not had EKG in past.  Currently no pain.  No fevers/chills, nausea, SOB, diaphoresis.  fmhx DM and CVA in mother's side.  States 4 maternal uncles with heart attacks.  Nonsmoker.  HTNive but well controlled.  HLD but well controlled on pravastatin.  Diabetic but well controlled. Lab Results  Component Value Date   LDLCALC 87 08/01/2010   Lab Results  Component Value Date   HGBA1C 7.0* 08/01/2010   Family History  Problem Relation Age of Onset  . Diabetes Mother   . Diabetes      Aunt  . Stroke      Aunt (had pacemaker)   Review of Systems Per HPI    Objective:   Physical Exam  Nursing note and vitals reviewed. Constitutional: She appears well-developed and well-nourished. No distress.  HENT:  Head: Normocephalic and atraumatic.  Mouth/Throat: Oropharynx is clear and moist. No oropharyngeal exudate.  Eyes: Conjunctivae and EOM are normal. Pupils are equal, round, and reactive to light. No scleral icterus.  Neck: Normal range of motion. Neck supple.  Cardiovascular: Normal rate, regular rhythm, normal heart sounds and intact distal pulses.   No murmur heard. Pulses:      Radial pulses are 2+ on the right side, and 2+ on the left side.  Pulmonary/Chest: Effort normal and breath sounds normal. No respiratory distress. She has no decreased breath sounds. She has no wheezes. She has no rales. She exhibits tenderness (2nd costochondral junction point tenderness).  Skin: Skin is warm and dry. No rash noted.  Psychiatric: She has a normal  mood and affect.       Assessment & Plan:

## 2010-12-20 ENCOUNTER — Ambulatory Visit (INDEPENDENT_AMBULATORY_CARE_PROVIDER_SITE_OTHER): Payer: Medicare Other | Admitting: Family Medicine

## 2010-12-20 ENCOUNTER — Encounter: Payer: Self-pay | Admitting: Family Medicine

## 2010-12-20 DIAGNOSIS — R159 Full incontinence of feces: Secondary | ICD-10-CM

## 2010-12-20 DIAGNOSIS — J029 Acute pharyngitis, unspecified: Secondary | ICD-10-CM | POA: Insufficient documentation

## 2010-12-20 NOTE — Assessment & Plan Note (Signed)
Anticipate viral pharyngitis.  Exam benign today. Treat supportively with rest, fluids.  Update [place] if not improving as expected, fever >101 or other concerns.

## 2010-12-20 NOTE — Assessment & Plan Note (Signed)
Episode x1, self limited. Occurring when pt has significantly increased fiber intake in form of vegetables, metamucil daily. rec back down on metamucil to QOD, continue vegetable/water/yogurt intake. To let us know if having recurrent episodes for further evaluation.

## 2010-12-20 NOTE — Progress Notes (Signed)
  Subjective:    Patient ID: Linda Thompson, female    DOB: Oct 15, 1955, 55 y.o.   MRN: [id number]  HPI CC: ST and bowel problems  Told by PCP to increase vegetables which she has been doing.  Also advised to take daily metamucil and yogurt probiotic which she has been doing.  Wednesday had episode of bowel urgency - large amt soft formed stool in britches, didn't make it to the bathroom.  No more episodes since Wednesday.  Last 2 days has had normal bowel movements.  Wonders if due to nerves.  Has not eaten fried foods.  Having 1-2 stools/day.  No fevers, abd pain, urinary changes, blood in stool, nausea/vomiting.  No black tarry stool, no perianal irritation.  Also with sore throat that has been present for several days.  Also sneezing more than normal and right ear feels a bit muffled.  Endorses minimal cough.  No ear pain, HA, chest pain or SOB.  Has had colonoscopy in past.  Review of Systems Per HPI    Objective:   Physical Exam  Nursing note and vitals reviewed. Constitutional: She appears well-developed and well-nourished. No distress.  HENT:  Head: Normocephalic and atraumatic.  Right Ear: Hearing, tympanic membrane, external ear and ear canal normal.  Left Ear: Hearing, tympanic membrane, external ear and ear canal normal.  Nose: No mucosal edema or rhinorrhea.  Mouth/Throat: Uvula is midline, oropharynx is clear and moist and mucous membranes are normal. No oropharyngeal exudate, posterior oropharyngeal edema, posterior oropharyngeal erythema or tonsillar abscesses.  Eyes: Conjunctivae and EOM are normal. Pupils are equal, round, and reactive to light. No scleral icterus.  Neck: Normal range of motion. Neck supple.  Cardiovascular: Normal rate, regular rhythm, normal heart sounds and intact distal pulses.   No murmur heard. Pulmonary/Chest: Effort normal and breath sounds normal. No respiratory distress. She has no wheezes. She has no rales.  Abdominal: Soft. Bowel sounds  are normal. She exhibits no distension and no mass. There is no tenderness. There is no rebound and no guarding.  Musculoskeletal: She exhibits no edema.  Lymphadenopathy:    She has no cervical adenopathy.  Skin: Skin is warm and dry. No rash noted.  Psychiatric: She has a normal mood and affect.       Assessment & Plan:

## 2010-12-20 NOTE — Patient Instructions (Signed)
Your throat is looking ok today, you may have viral infection causing some of these symptoms. Back off on metamucil to every other day.  Continue vegetables and yogurt. Let us know if having any other accidents or fever >101.5 or worsening cough.

## 2010-12-21 ENCOUNTER — Emergency Department (HOSPITAL_COMMUNITY)
Admission: EM | Admit: 2010-12-21 | Discharge: 2010-12-21 | Disposition: A | Discharge status: Home / Self Care | Payer: Medicare Other | Attending: Emergency Medicine | Admitting: Emergency Medicine

## 2010-12-21 DIAGNOSIS — E785 Hyperlipidemia, unspecified: Secondary | ICD-10-CM | POA: Insufficient documentation

## 2010-12-21 DIAGNOSIS — Z79899 Other long term (current) drug therapy: Secondary | ICD-10-CM | POA: Insufficient documentation

## 2010-12-21 DIAGNOSIS — E119 Type 2 diabetes mellitus without complications: Secondary | ICD-10-CM | POA: Insufficient documentation

## 2010-12-21 DIAGNOSIS — K219 Gastro-esophageal reflux disease without esophagitis: Secondary | ICD-10-CM | POA: Insufficient documentation

## 2010-12-21 DIAGNOSIS — H409 Unspecified glaucoma: Secondary | ICD-10-CM | POA: Insufficient documentation

## 2010-12-21 DIAGNOSIS — K589 Irritable bowel syndrome without diarrhea: Secondary | ICD-10-CM | POA: Insufficient documentation

## 2010-12-21 DIAGNOSIS — Z7982 Long term (current) use of aspirin: Secondary | ICD-10-CM | POA: Insufficient documentation

## 2010-12-21 DIAGNOSIS — R040 Epistaxis: Secondary | ICD-10-CM

## 2010-12-21 DIAGNOSIS — M19079 Primary osteoarthritis, unspecified ankle and foot: Secondary | ICD-10-CM | POA: Insufficient documentation

## 2010-12-21 DIAGNOSIS — I10 Essential (primary) hypertension: Secondary | ICD-10-CM | POA: Insufficient documentation

## 2010-12-21 NOTE — ED Provider Notes (Signed)
History     CSN: [id number] Arrival date & time: 12/21/2010  9:14 PM   None     Chief Complaint  Patient presents with  . Epistaxis    EMS report elevated B/P  180/100, 2nd B/P 160 palpated. HR 122    (Consider location/radiation/quality/duration/timing/severity/associated sxs/prior treatment) Patient is a 55 y.o. female presenting with nosebleeds. The history is provided by the patient.  Epistaxis  This is a new problem. The problem has been resolved. The problem is associated with an unknown factor. The bleeding has been from the left nare. Her past medical history does not include bleeding disorder, colds, sinus problems, allergies or nose-picking. Frequent nosebleeds: has had a few nose bleeds in the past.    Pt presents to the ED by ambulance with complaints of nose bleed. Pt states it lasted for a few minutes so she called the ambulance. Pt has had nose bleeds in the past. She denies pain, injury, nose picking. Pt has not been using nasal spray that she was prescribed. Pt denies any other symptoms. Bleeding completely resolved.    Past Medical History  Diagnosis Date  . Allergic rhinitis, cause unspecified   . Pain in joint, ankle and foot   . Backache, unspecified     chronic LBP with radiculopathy  . Seborrheic dermatitis, unspecified   . Type II or unspecified type diabetes mellitus without mention of complication, not stated as uncontrolled   . History of fracture of arm     Right  . History of fracture of foot   . Dermatomycosis, unspecified   . Esophageal reflux   . Unspecified glaucoma   . Undiagnosed cardiac murmurs   . Diaphragmatic hernia without mention of obstruction or gangrene   . Other and unspecified hyperlipidemia   . Unspecified essential hypertension   . Irritable bowel syndrome   . Benign neoplasm of cerebral meninges   . Moderate intellectual disabilities   . Nausea alone   . Osteoarthrosis, unspecified whether generalized or localized, ankle  and foot   . Pruritus ani   . Unspecified tinnitus   . Nonspecific elevation of levels of transaminase or lactic acid dehydrogenase (LDH)   . Leukorrhea, not specified as infective   . Obesity   . Hyperglycemia   . DDD (degenerative disc disease), lumbar     Past Surgical History  Procedure Date  . Total abdominal hysterectomy   . Cataract extraction     Family History  Problem Relation Age of Onset  . Diabetes Mother   . Diabetes      Aunt  . Stroke      Aunt (had pacemaker)    History  Substance Use Topics  . Smoking status: Never Smoker   . Smokeless tobacco: Not on file  . Alcohol Use: No    OB History    Grav Para Term Preterm Abortions TAB SAB Ect Mult Living                  Review of Systems  HENT: Positive for nosebleeds.   All other systems reviewed and are negative.    Allergies  Ace inhibitors; Cyclobenzaprine hcl; Flonase; Ibuprofen; and WUJ:WJXBJYNWGNF+AOZHYQMVH+QIONGEXBMW acid+aspartame  Home Medications   Current Outpatient Rx  Name Route Sig Dispense Refill  . ASPIRIN 81 MG PO TABS Oral Take 81 mg by mouth daily.      Marland Kitchen CALCIUM-VITAMIN D 500-200 MG-UNIT PO TABS Oral Take 1 tablet by mouth 2 (two) times daily with a meal.      .  DESLORATADINE 5 MG PO TABS Oral Take 5 mg by mouth daily as needed. For allergies     . DM-GUAIFENESIN ER 30-600 MG PO TB12 Oral Take 1 tablet by mouth 2 (two) times daily as needed (cough). 30 tablet 0  . DILTIAZEM HCL ER COATED BEADS 240 MG PO CP24 Oral Take 240 mg by mouth daily.      Marland Kitchen HYDROCHLOROTHIAZIDE 12.5 MG PO CAPS Oral Take 12.5 mg by mouth daily.      Marland Kitchen KETOCONAZOLE 2 % EX CREA  APPLY SMALL AMOUNT TO AFFECTED AREA TWICE DAILY AS NEEDED 30 g 1  . KETOCONAZOLE 2 % EX SHAM Topical Apply 1 application topically 2 (two) times a week.      . METFORMIN HCL 500 MG PO TABS Oral Take 1 tablet (500 mg total) by mouth 2 (two) times daily with a meal. 30 tablet 11  . METHYLCELLULOSE (LAXATIVE) PO POWD Oral Take by  mouth daily.      Marland Kitchen OMEPRAZOLE 20 MG PO CPDR Oral Take 20 mg by mouth daily.      Marland Kitchen POTASSIUM CHLORIDE CRYS CR 20 MEQ PO TBCR Oral Take 1 tablet (20 mEq total) by mouth daily. 30 tablet 11  . PRAVASTATIN SODIUM 40 MG PO TABS Oral Take 1 tablet (40 mg total) by mouth every evening. 30 tablet 11  . ALBUTEROL SULFATE HFA 108 (90 BASE) MCG/ACT IN AERS Inhalation Inhale 2 puffs into the lungs every 4 (four) hours as needed. 1 Inhaler 6  . NYSTATIN 100000 UNIT/GM EX POWD  Apply to affected area 3 times daily 15 g 0  . NYSTATIN-TRIAMCINOLONE 100000-0.1 UNIT/GM-% EX OINT Topical Apply topically 2 (two) times daily. 30 g 0  . PROBIOTIC PO CAPS Oral Take 1 capsule by mouth daily.      . TERCONAZOLE 0.8 % VA CREA  Apply to affected areas once daily   20 g 0    BP 116/81  Pulse 90  Temp(Src) 97.3 F (36.3 C) (Oral)  Resp 19  SpO2 96%  Physical Exam  Constitutional: She appears well-developed and well-nourished.  HENT:  Head: Normocephalic and atraumatic.  Nose:    Eyes: Conjunctivae are normal. Pupils are equal, round, and reactive to light.  Neck: Trachea normal, normal range of motion and full passive range of motion without pain. Neck supple.  Cardiovascular: Normal rate, regular rhythm and normal pulses.   Pulmonary/Chest: Effort normal and breath sounds normal. Chest wall is not dull to percussion. She exhibits no tenderness, no crepitus, no edema, no deformity and no retraction.  Abdominal: Normal appearance.  Musculoskeletal: Normal range of motion.  Neurological: She is alert. She has normal strength.  Skin: Skin is warm, dry and intact.  Psychiatric: She has a normal mood and affect. Her speech is normal and behavior is normal. Judgment and thought content normal. Cognition and memory are normal.    ED Course  Procedures (including critical care time)  Labs Reviewed - No data to display No results found.   No diagnosis found.    MDM  Pt referred back to  PCP.        Dorthula Matas, PA 12/21/10 2220

## 2010-12-22 NOTE — ED Provider Notes (Signed)
Medical screening examination/treatment/procedure(s) were performed by non-physician practitioner and as supervising physician I was immediately available for consultation/collaboration.   Glynn Octave, MD 12/22/10 [phone]

## 2010-12-23 ENCOUNTER — Encounter: Payer: Self-pay | Admitting: Family Medicine

## 2010-12-23 ENCOUNTER — Ambulatory Visit: Payer: Medicare Other | Admitting: Family Medicine

## 2010-12-23 ENCOUNTER — Ambulatory Visit (INDEPENDENT_AMBULATORY_CARE_PROVIDER_SITE_OTHER): Payer: Medicare Other | Admitting: Family Medicine

## 2010-12-23 ENCOUNTER — Telehealth: Payer: Self-pay | Admitting: Family Medicine

## 2010-12-23 DIAGNOSIS — R040 Epistaxis: Secondary | ICD-10-CM | POA: Insufficient documentation

## 2010-12-23 NOTE — Progress Notes (Signed)
Subjective:    Patient ID: Linda Thompson, female    DOB: 1955-10-14, 55 y.o.   MRN: [id number]  HPI Here for f/u of nosebleed Went to ER with this on 12/1 - and bp was fine  (bp was up at home when neighbor checked it ) -- ? What it was  One nostril- anterior bleed Packed and resolved quickly  This really frightened pt (who has moderate mental retardation)  Pt states she had 2 nose bleeds -- went for the first time   2nd nosebleed on same side - L and she pinched her nose and it stopped within few minutes   Air in house is dry   No longer uses a steroid nasal spray  Was using nasal saline for a cold (which is better) and now she stopped it   Patient Active Problem List  Diagnoses  . FUNGAL DERMATITIS  . MENINGIOMA  . DIABETES MELLITUS, TYPE II  . HYPERLIPIDEMIA  . MENTAL RETARDATION, MODERATE  . GLAUCOMA NOS  . HYPERTENSION  . ALLERGIC  RHINITIS  . GERD  . HIATAL HERNIA  . DERMATITIS, SEBORRHEIC NOS  . PRURITUS ANI  . OSTEOARTHRITIS, FOOT  . HEART MURMUR  . TRANSAMINASES, SERUM, ELEVATED  . Personal history of other musculoskeletal disorders  . IRRITABLE BOWEL SYNDROME  . ANKLE PAIN, LEFT  . Hypokalemia  . Back pain  . Candidal intertrigo  . Fecal incontinence  . Sore throat  . Epistaxis   Past Medical History  Diagnosis Date  . Allergic rhinitis, cause unspecified   . Pain in joint, ankle and foot   . Backache, unspecified     chronic LBP with radiculopathy  . Seborrheic dermatitis, unspecified   . Type II or unspecified type diabetes mellitus without mention of complication, not stated as uncontrolled   . History of fracture of arm     Right  . History of fracture of foot   . Dermatomycosis, unspecified   . Esophageal reflux   . Unspecified glaucoma   . Undiagnosed cardiac murmurs   . Diaphragmatic hernia without mention of obstruction or gangrene   . Other and unspecified hyperlipidemia   . Unspecified essential hypertension   . Irritable bowel  syndrome   . Benign neoplasm of cerebral meninges   . Moderate intellectual disabilities   . Nausea alone   . Osteoarthrosis, unspecified whether generalized or localized, ankle and foot   . Pruritus ani   . Unspecified tinnitus   . Nonspecific elevation of levels of transaminase or lactic acid dehydrogenase (LDH)   . Leukorrhea, not specified as infective   . Obesity   . Hyperglycemia   . DDD (degenerative disc disease), lumbar    Past Surgical History  Procedure Date  . Total abdominal hysterectomy   . Cataract extraction    History  Substance Use Topics  . Smoking status: Never Smoker   . Smokeless tobacco: Not on file  . Alcohol Use: No   Family History  Problem Relation Age of Onset  . Diabetes Mother   . Diabetes      Aunt  . Stroke      Aunt (had pacemaker)   Allergies  Allergen Reactions  . Ace Inhibitors     REACTION: COUGH  . Cyclobenzaprine Hcl     REACTION: diarrhea  . Flonase (Fluticasone Propionate)     nosebleed  . Ibuprofen     REACTION: vomiting  . NWG:NFAOZHYQMVH+QIONGEXBM+WUXLKGMWNU Acid+Aspartame     REACTION: diarrhea   Current  Outpatient Prescriptions on File Prior to Visit  Medication Sig Dispense Refill  . aspirin 81 MG tablet Take 81 mg by mouth daily.        . Calcium Carbonate-Vitamin D (CALCIUM-VITAMIN D) 500-200 MG-UNIT per tablet Take 1 tablet by mouth 2 (two) times daily with a meal.        . diltiazem (CARDIZEM CD) 240 MG 24 hr capsule Take 240 mg by mouth daily.        . hydrochlorothiazide (,MICROZIDE/HYDRODIURIL,) 12.5 MG capsule Take 12.5 mg by mouth daily.        Marland Kitchen ketoconazole (NIZORAL) 2 % cream APPLY SMALL AMOUNT TO AFFECTED AREA TWICE DAILY AS NEEDED  30 g  1  . ketoconazole (NIZORAL) 2 % shampoo Apply 1 application topically 2 (two) times a week.        . metFORMIN (GLUCOPHAGE) 500 MG tablet Take 1 tablet (500 mg total) by mouth 2 (two) times daily with a meal.  30 tablet  11  . omeprazole (PRILOSEC) 20 MG capsule Take 20  mg by mouth daily.        . potassium chloride SA (KLOR-CON M20) 20 MEQ tablet Take 1 tablet (20 mEq total) by mouth daily.  30 tablet  11  . pravastatin (PRAVACHOL) 40 MG tablet Take 1 tablet (40 mg total) by mouth every evening.  30 tablet  11  . PROBIOTIC CAPS Take 1 capsule by mouth daily.        . methylcellulose oral powder Take by mouth daily.        Marland Kitchen nystatin (MYCOSTATIN) powder Apply to affected area 3 times daily  15 g  0  . nystatin-triamcinolone (MYCOLOG) ointment Apply topically 2 (two) times daily.  30 g  0  . terconazole (TERAZOL 3) 0.8 % vaginal cream Apply to affected areas once daily    20 g  0      Review of Systems Review of Systems  Constitutional: Negative for fever, appetite change, fatigue and unexpected weight change.  Eyes: Negative for pain and visual disturbance.  ENT pos for nosebleed, some congestion that is improved, no sinus pain  Respiratory: Negative for cough and shortness of breath.   Cardiovascular: Negative for cp or palpitations    Gastrointestinal: Negative for nausea, diarrhea and constipation.  Genitourinary: Negative for urgency and frequency.  Skin: Negative for pallor or rash   Neurological: Negative for weakness, light-headedness, numbness and headaches.  Hematological: Negative for adenopathy. Does not bruise/bleed easily.  Psychiatric/Behavioral: Negative for dysphoric mood. The patient is not nervous/anxious.          Objective:   Physical Exam  Constitutional: She appears well-developed and well-nourished. No distress.       overwt and well appearing   HENT:  Head: Normocephalic and atraumatic.  Right Ear: External ear normal.  Left Ear: External ear normal.  Mouth/Throat: Oropharynx is clear and moist.       Nares are pale and boggy- with small scab on septum of L nare that is not actively bleeding (also no blood in nare)  No sinus tenderness  Eyes: Conjunctivae and EOM are normal. Pupils are equal, round, and reactive to  light. Right eye exhibits no discharge. Left eye exhibits no discharge. No scleral icterus.  Neck: Normal range of motion. Neck supple. No JVD present. Carotid bruit is not present.  Cardiovascular: Normal rate, regular rhythm and normal heart sounds.  Exam reveals no gallop.   Musculoskeletal: She exhibits no edema.  Lymphadenopathy:  She has no cervical adenopathy.  Skin: Skin is warm and dry. No pallor.  Psychiatric: She has a normal mood and affect.       Baseline MR- repeats herself frequently Is pleasant  Voices understanding of inst          Assessment & Plan:

## 2010-12-23 NOTE — Patient Instructions (Signed)
Get a vaporizer for your bedroom to use in the winter to keep air from getting too dry  Stop any nasal sprays  Use a little vaseline in each nostril once or twice daily  This will help the spot in your nose heal  If you get a nosebleed- firmly pinch nose for 15 minutes without letting go  If the bleeding does not stop at that time - then call or go to the ER Please let me know if this does not improve Nosebleed Nosebleeds can be caused by many conditions including trauma, infections, polyps, foreign bodies, dry mucous membranes or climate, medications and air conditioning. Most nosebleeds occur in the front of the nose. It is because of this location that most nosebleeds can be controlled by pinching the nostrils gently and continuously. Do this for at least 10 to 20 minutes. The reason for this long continuous pressure is that you must hold it long enough for the blood to clot. If during that 10 to 20 minute time period, pressure is released, the process may have to be started again. The nosebleed may stop by itself, quit with pressure, need concentrated heating (cautery) or stop with pressure from packing. HOME CARE INSTRUCTIONS    If your nose was packed, try to maintain the pack inside until your caregiver removes it. If a gauze pack was used and it starts to fall out, gently replace or cut the end off. Do not cut if a balloon catheter was used to pack the nose. Otherwise, do not remove unless instructed.     Avoid blowing your nose for 12 hours after treatment. This could dislodge the pack or clot and start bleeding again.     If the bleeding starts again, sit up and bending forward, gently pinch the front half of your nose continuously for 20 minutes.     If bleeding was caused by dry mucous membranes, cover the inside of your nose every morning with a petroleum or antibiotic ointment. Use your little fingertip as an applicator. Do this as needed during dry weather. This will keep the mucous  membranes moist and allow them to heal.     Maintain humidity in your home by using less air conditioning or using a humidifier.     Do not use aspirin or medications which make bleeding more likely. Your caregiver can give you recommendations on this.     Resume normal activities as able but try to avoid straining, lifting or bending at the waist for several days.     If the nosebleeds become recurrent and the cause is unknown, your caregiver may suggest laboratory tests.  SEEK IMMEDIATE MEDICAL CARE IF:    Bleeding recurs and cannot be controlled.     There is unusual bleeding from or bruising on other parts of the body.     You have a fever.     Nosebleeds continue.     There is any worsening of the condition which originally brought you in.     You become lightheaded, feel faint, become sweaty or vomit blood.  MAKE SURE YOU:    Understand these instructions.     Will watch your condition.     Will get help right away if you are not doing well or get worse.  Document Released: 10/16/2004 Document Revised: 09/18/2010 Document Reviewed: 12/08/2008 [facility] Patient Information 2012 [place], [place].

## 2010-12-23 NOTE — Assessment & Plan Note (Signed)
Recurrent in L nostril due to scab seen at septum Disc symptomatic care - see instructions on AVS  Also handout on nosebleed Recommend stop nasal spray and also use vaporizer in bedroom as well as vaseline in nose Make sure to drink enough fluids

## 2010-12-23 NOTE — Telephone Encounter (Signed)
Triage Record Num: [id number] Operator: Lesli Albee Patient Name: Linda Thompson Call Date & Time: 12/22/2010 1:01:41PM Patient Phone: [phone] PCP: Audrie Gallus. Tower Patient Gender: Female PCP Fax : Patient DOB: 08-10-1955 Practice Name: [facility] Reason for Call: Caller: [name]; PCP: [name]; CB#: [phone]; Call Reason: Loose Stool; Sx Onset: 12/22/2010; Sx Notes: ; Afebrile; ; Home treatment(s) tried: None; Guideline Used: ; Disp:; Appt Scheduled?: N Pt is callling - she was into the office on Friday and dx with a virus. Pt was instructed to go home, drink plenty of fluids and rest. Pt is calling back with questions about hydrating herself. She has started with diarrhea and has drinking gingerale. RN advised pt to switch to gatorade. Rn also advised BRAT diet. The pt repeats the same thing over and over. RN reiterated home care. Protocol(s) Used: Information Only Call; No Symptom Triage (Adult) Recommended Outcome per Protocol: Call Provider When Office is Open Reason for Outcome: Requesting information and provider is best resource; no triage required.

## 2010-12-23 NOTE — Telephone Encounter (Signed)
I saw her in the office today and she was ok

## 2010-12-23 NOTE — Telephone Encounter (Signed)
Reason for Call: Caller: Trisha/Patient; PCP: [name]; CB#: [phone]; Call Reason: Nosebleed, Was Seen in E. Dlast Night; Sx Onset: 12/21/2010; Sx Notes: ; Afebrile; Nosebleed this evening and last evening. Went to ED last evening for nosebleed. Small amount of bleeding. All Emergent Symptoms ruled out per Nosebleed protocol. Home care advice given. To call offfice in am for appointment. BP ok while in Emergency room.

## 2010-12-25 ENCOUNTER — Other Ambulatory Visit: Payer: Self-pay | Admitting: Family Medicine

## 2010-12-26 NOTE — Telephone Encounter (Signed)
[facility] request refill Nizoral 2% shampoo #120 ml x3.

## 2011-01-07 ENCOUNTER — Emergency Department (INDEPENDENT_AMBULATORY_CARE_PROVIDER_SITE_OTHER)
Admission: EM | Admit: 2011-01-07 | Discharge: 2011-01-07 | Disposition: A | Discharge status: Home / Self Care | Payer: Medicare Other | Source: Home / Self Care | Attending: Emergency Medicine | Admitting: Emergency Medicine

## 2011-01-07 ENCOUNTER — Encounter (HOSPITAL_COMMUNITY): Payer: Self-pay | Admitting: *Deleted

## 2011-01-07 DIAGNOSIS — Z0000 Encounter for general adult medical examination without abnormal findings: Secondary | ICD-10-CM

## 2011-01-07 NOTE — ED Provider Notes (Signed)
History     CSN: [id number] Arrival date & time: 01/07/2011  5:53 PM   First MD Initiated Contact with Patient 01/07/11 1659      Chief Complaint  Patient presents with  . Fever    (Consider location/radiation/quality/duration/timing/severity/associated sxs/prior treatment) HPI Comments: Pt states she felt "feverish" earlier today and wanted to make sure that she was okay. Sx have resolved.  Did not measure temp at home. Did not take anything for her sx. Pt w/o complaints here. Got flu shot in August.   Patient is a 55 y.o. female presenting with fever. The history is provided by the patient.  Fever Primary symptoms of the febrile illness include fever. Primary symptoms do not include fatigue, visual change, headaches, cough, wheezing, abdominal pain, nausea, vomiting, diarrhea, dysuria, myalgias, arthralgias or rash. The current episode started today.    Past Medical History  Diagnosis Date  . Allergic rhinitis, cause unspecified   . Pain in joint, ankle and foot   . Backache, unspecified     chronic LBP with radiculopathy  . Seborrheic dermatitis, unspecified   . Type II or unspecified type diabetes mellitus without mention of complication, not stated as uncontrolled   . History of fracture of arm     Right  . History of fracture of foot   . Dermatomycosis, unspecified   . Esophageal reflux   . Unspecified glaucoma   . Undiagnosed cardiac murmurs   . Diaphragmatic hernia without mention of obstruction or gangrene   . Other and unspecified hyperlipidemia   . Unspecified essential hypertension   . Irritable bowel syndrome   . Benign neoplasm of cerebral meninges   . Moderate intellectual disabilities   . Nausea alone   . Osteoarthrosis, unspecified whether generalized or localized, ankle and foot   . Pruritus ani   . Unspecified tinnitus   . Nonspecific elevation of levels of transaminase or lactic acid dehydrogenase (LDH)   . Leukorrhea, not specified as infective     . Obesity   . Hyperglycemia   . DDD (degenerative disc disease), lumbar     Past Surgical History  Procedure Date  . Total abdominal hysterectomy   . Cataract extraction     Family History  Problem Relation Age of Onset  . Diabetes Mother   . Diabetes      Aunt  . Stroke      Aunt (had pacemaker)    History  Substance Use Topics  . Smoking status: Never Smoker   . Smokeless tobacco: Not on file  . Alcohol Use: No    OB History    Grav Para Term Preterm Abortions TAB SAB Ect Mult Living                  Review of Systems  Constitutional: Positive for fever. Negative for fatigue.  HENT: Negative for ear pain, congestion, rhinorrhea, sneezing, voice change, postnasal drip and sinus pressure.   Respiratory: Negative for cough and wheezing.   Gastrointestinal: Negative for nausea, vomiting, abdominal pain, diarrhea and constipation.  Genitourinary: Negative for dysuria, urgency, frequency, hematuria and flank pain.  Musculoskeletal: Positive for back pain. Negative for myalgias and arthralgias.  Skin: Negative for rash.  Neurological: Negative for weakness and headaches.    Allergies  Ace inhibitors; Augmentin; Cyclobenzaprine hcl; Flonase; Ibuprofen; and WGN:FAOZHYQMVHQ+IONGEXBMW+UXLKGMWNUU acid+aspartame  Home Medications   Current Outpatient Rx  Name Route Sig Dispense Refill  . ASPIRIN 81 MG PO TABS Oral Take 81 mg by mouth daily.      Marland Kitchen  CALCIUM-VITAMIN D 500-200 MG-UNIT PO TABS Oral Take 1 tablet by mouth 2 (two) times daily with a meal.      . DILTIAZEM HCL ER COATED BEADS 240 MG PO CP24 Oral Take 240 mg by mouth daily.      Marland Kitchen HYDROCHLOROTHIAZIDE 12.5 MG PO CAPS Oral Take 12.5 mg by mouth daily.      Marland Kitchen KETOCONAZOLE 2 % EX CREA  APPLY SMALL AMOUNT TO AFFECTED AREA TWICE DAILY AS NEEDED 30 g 1  . KETOCONAZOLE 2 % EX SHAM  USE AS DIRECTED TWICE WEEKLY PRN 120 mL 3  . METFORMIN HCL 500 MG PO TABS Oral Take 1 tablet (500 mg total) by mouth 2 (two) times daily  with a meal. 30 tablet 11  . METHYLCELLULOSE (LAXATIVE) PO POWD Oral Take by mouth daily.      . NYSTATIN 100000 UNIT/GM EX POWD  Apply to affected area 3 times daily 15 g 0  . NYSTATIN-TRIAMCINOLONE 100000-0.1 UNIT/GM-% EX OINT Topical Apply topically 2 (two) times daily. 30 g 0  . OMEPRAZOLE 20 MG PO CPDR Oral Take 20 mg by mouth daily.      Marland Kitchen POTASSIUM CHLORIDE CRYS CR 20 MEQ PO TBCR Oral Take 1 tablet (20 mEq total) by mouth daily. 30 tablet 11  . PRAVASTATIN SODIUM 40 MG PO TABS Oral Take 1 tablet (40 mg total) by mouth every evening. 30 tablet 11  . PROBIOTIC PO CAPS Oral Take 1 capsule by mouth daily.      . TERCONAZOLE 0.8 % VA CREA  Apply to affected areas once daily   20 g 0    BP 127/83  Pulse 88  Temp(Src) 98.3 F (36.8 C) (Oral)  Resp 18  SpO2 98%  Physical Exam  Nursing note and vitals reviewed. Constitutional: She is oriented to person, place, and time. She appears well-developed and well-nourished. No distress.  HENT:  Head: Normocephalic and atraumatic.  Right Ear: Tympanic membrane normal.  Left Ear: Tympanic membrane normal.  Nose: Nose normal. Right sinus exhibits no maxillary sinus tenderness and no frontal sinus tenderness. Left sinus exhibits no maxillary sinus tenderness and no frontal sinus tenderness.  Mouth/Throat: Uvula is midline, oropharynx is clear and moist and mucous membranes are normal.  Eyes: Conjunctivae and EOM are normal. Pupils are equal, round, and reactive to light.  Neck: Normal range of motion. Neck supple.  Cardiovascular: Normal rate, regular rhythm, normal heart sounds and intact distal pulses.   No murmur heard. Pulmonary/Chest: Effort normal and breath sounds normal. No respiratory distress. She has no wheezes. She has no rales. She exhibits no tenderness.  Abdominal: Soft. Bowel sounds are normal. She exhibits no distension and no mass. There is no tenderness. There is no rebound, no guarding and no CVA tenderness.  Musculoskeletal:  Normal range of motion. She exhibits no edema and no tenderness.  Lymphadenopathy:    She has no cervical adenopathy.  Neurological: She is alert and oriented to person, place, and time.  Skin: Skin is warm and dry.  Psychiatric: She has a normal mood and affect. Her behavior is normal. Judgment and thought content normal.    ED Course  Procedures (including critical care time)  Labs Reviewed - No data to display No results found.   1. Normal physical exam       MDM  Pt w/o complaints, AF, has not taken any antipyretics, has no complaints, phys exam completely WNL. will have her monitor temperature at home. D/w to return to  ED. Will f/u with PMD prn.  Luiz Blare, MD 01/07/11 [phone]

## 2011-01-07 NOTE — ED Notes (Addendum)
Pt is here because she "felt like she had a fever."  Denies any additional symptoms/complaints.  Pt is currently afebrile, did not check temp prior to arrival.

## 2011-01-10 ENCOUNTER — Ambulatory Visit (INDEPENDENT_AMBULATORY_CARE_PROVIDER_SITE_OTHER): Payer: Medicare Other | Admitting: Family Medicine

## 2011-01-10 ENCOUNTER — Encounter: Payer: Self-pay | Admitting: Family Medicine

## 2011-01-10 VITALS — BP 140/80 | HR 92 | Temp 97.4°F | Ht 62.0 in | Wt 198.8 lb

## 2011-01-10 DIAGNOSIS — M549 Dorsalgia, unspecified: Secondary | ICD-10-CM

## 2011-01-10 LAB — POCT URINALYSIS DIPSTICK
Bilirubin, UA: NEGATIVE
Glucose, UA: NEGATIVE
Ketones, UA: NEGATIVE
Leukocytes, UA: NEGATIVE
Spec Grav, UA: 1.02

## 2011-01-10 MED ORDER — CYCLOBENZAPRINE HCL 10 MG PO TABS: 10.0000 mg | ORAL_TABLET | Freq: Two times a day (BID) | ORAL | Status: DC | PRN

## 2011-01-10 MED ORDER — OMEPRAZOLE 20 MG PO CPDR: 20.0000 mg | DELAYED_RELEASE_CAPSULE | Freq: Every day | ORAL | Status: DC

## 2011-01-10 MED ORDER — HYDROCHLOROTHIAZIDE 12.5 MG PO CAPS: 12.5000 mg | ORAL_CAPSULE | Freq: Every day | ORAL | Status: DC

## 2011-01-10 MED ORDER — DILTIAZEM HCL ER COATED BEADS 240 MG PO CP24: 240.0000 mg | ORAL_CAPSULE | Freq: Every day | ORAL | Status: DC

## 2011-01-10 NOTE — Assessment & Plan Note (Signed)
Recurrent and mild (with hx of CCC and injections with Dr Ophelia Charter in past) This seems like mild spasm on exam Trial of flexeril if needed/ stretching and heat with caution  Will update if worse or any neurol symptoms  Also ua neg today

## 2011-01-10 NOTE — Patient Instructions (Signed)
I think you have a mild back spasm  Use heat from heating pad 10 minutes at a time and keep stretching  You can try the flexeril (muscle relaxer) as needed- watch out from sleepiness If worse or not improved in 1 week let me know  I sent med to your pharmacy and did refils Urine test is normal today

## 2011-01-10 NOTE — Progress Notes (Signed)
Subjective:    Patient ID: Linda Thompson, female    DOB: 08-26-1955, 55 y.o.   MRN: [id number]  HPI Here for f/u of low back pain  Is on L side of low back  Feels like pressure and dull pain overall  Feels like a spasm is coming on  Hurts to bend forward    Hx of DDD in past and has been seen by Dr Ophelia Charter and had injections  ua today clear  No burning or blood or frequency or fever   Patient Active Problem List  Diagnoses  . FUNGAL DERMATITIS  . MENINGIOMA  . DIABETES MELLITUS, TYPE II  . HYPERLIPIDEMIA  . MENTAL RETARDATION, MODERATE  . GLAUCOMA NOS  . HYPERTENSION  . ALLERGIC  RHINITIS  . GERD  . HIATAL HERNIA  . DERMATITIS, SEBORRHEIC NOS  . PRURITUS ANI  . OSTEOARTHRITIS, FOOT  . HEART MURMUR  . TRANSAMINASES, SERUM, ELEVATED  . Personal history of other musculoskeletal disorders  . IRRITABLE BOWEL SYNDROME  . ANKLE PAIN, LEFT  . Hypokalemia  . Back pain  . Candidal intertrigo  . Fecal incontinence  . Sore throat  . Epistaxis   Past Medical History  Diagnosis Date  . Allergic rhinitis, cause unspecified   . Pain in joint, ankle and foot   . Backache, unspecified     chronic LBP with radiculopathy  . Seborrheic dermatitis, unspecified   . Type II or unspecified type diabetes mellitus without mention of complication, not stated as uncontrolled   . History of fracture of arm     Right  . History of fracture of foot   . Dermatomycosis, unspecified   . Esophageal reflux   . Unspecified glaucoma   . Undiagnosed cardiac murmurs   . Diaphragmatic hernia without mention of obstruction or gangrene   . Other and unspecified hyperlipidemia   . Unspecified essential hypertension   . Irritable bowel syndrome   . Benign neoplasm of cerebral meninges   . Moderate intellectual disabilities   . Nausea alone   . Osteoarthrosis, unspecified whether generalized or localized, ankle and foot   . Pruritus ani   . Unspecified tinnitus   . Nonspecific elevation of  levels of transaminase or lactic acid dehydrogenase (LDH)   . Leukorrhea, not specified as infective   . Obesity   . Hyperglycemia   . DDD (degenerative disc disease), lumbar    Past Surgical History  Procedure Date  . Total abdominal hysterectomy   . Cataract extraction    History  Substance Use Topics  . Smoking status: Never Smoker   . Smokeless tobacco: Not on file  . Alcohol Use: No   Family History  Problem Relation Age of Onset  . Diabetes Mother   . Diabetes      Aunt  . Stroke      Aunt (had pacemaker)   Allergies  Allergen Reactions  . Ace Inhibitors     REACTION: COUGH  . Augmentin   . Cyclobenzaprine Hcl     REACTION: diarrhea  . Flonase (Fluticasone Propionate)     nosebleed  . Ibuprofen     REACTION: vomiting  . WUJ:WJXBJYNWGNF+AOZHYQMVH+QIONGEXBMW Acid+Aspartame     REACTION: diarrhea   Current Outpatient Prescriptions on File Prior to Visit  Medication Sig Dispense Refill  . aspirin 81 MG tablet Take 81 mg by mouth daily.        . Calcium Carbonate-Vitamin D (CALCIUM-VITAMIN D) 500-200 MG-UNIT per tablet Take 1 tablet by mouth 2 (  two) times daily with a meal.        . diltiazem (CARDIZEM CD) 240 MG 24 hr capsule Take 240 mg by mouth daily.        . hydrochlorothiazide (,MICROZIDE/HYDRODIURIL,) 12.5 MG capsule Take 12.5 mg by mouth daily.        Marland Kitchen ketoconazole (NIZORAL) 2 % shampoo USE AS DIRECTED TWICE WEEKLY PRN  120 mL  3  . metFORMIN (GLUCOPHAGE) 500 MG tablet Take 1 tablet (500 mg total) by mouth 2 (two) times daily with a meal.  30 tablet  11  . methylcellulose oral powder Take by mouth daily.        Marland Kitchen nystatin (MYCOSTATIN) powder Apply to affected area 3 times daily  15 g  0  . nystatin-triamcinolone (MYCOLOG) ointment Apply topically 2 (two) times daily.  30 g  0  . omeprazole (PRILOSEC) 20 MG capsule Take 20 mg by mouth daily.        . potassium chloride SA (KLOR-CON M20) 20 MEQ tablet Take 1 tablet (20 mEq total) by mouth daily.  30 tablet   11  . pravastatin (PRAVACHOL) 40 MG tablet Take 1 tablet (40 mg total) by mouth every evening.  30 tablet  11  . PROBIOTIC CAPS Take 1 capsule by mouth daily.        Marland Kitchen terconazole (TERAZOL 3) 0.8 % vaginal cream Apply to affected areas once daily    20 g  0  . ketoconazole (NIZORAL) 2 % cream APPLY SMALL AMOUNT TO AFFECTED AREA TWICE DAILY AS NEEDED  30 g  1       Review of Systems Review of Systems  Constitutional: Negative for fever, appetite change, fatigue and unexpected weight change.  Eyes: Negative for pain and visual disturbance.  Respiratory: Negative for cough and shortness of breath.   Cardiovascular: Negative for cp or palpitations    Gastrointestinal: Negative for nausea, diarrhea and constipation.  Genitourinary: Negative for urgency and frequency.  Skin: Negative for pallor or rash   MSK pos for L low back and buttock pain, no swelling or joint change  Neurological: Negative for weakness, light-headedness, numbness and headaches.  Hematological: Negative for adenopathy. Does not bruise/bleed easily.  Psychiatric/Behavioral: Negative for dysphoric mood. The patient is not nervous/anxious.          Objective:   Physical Exam  Constitutional: She appears well-developed and well-nourished. No distress.       overwt and well appearing   HENT:  Head: Normocephalic and atraumatic.  Mouth/Throat: Oropharynx is clear and moist.  Eyes: Conjunctivae and EOM are normal. Pupils are equal, round, and reactive to light. No scleral icterus.  Neck: Normal range of motion. Neck supple. No thyromegaly present.  Cardiovascular: Normal rate, regular rhythm, normal heart sounds and intact distal pulses.  Exam reveals no gallop.   Pulmonary/Chest: Breath sounds normal. No respiratory distress. She has no wheezes.  Abdominal: Soft. Bowel sounds are normal.       No suprapubic tenderness  No cva tenderness  Musculoskeletal: Normal range of motion. She exhibits tenderness. She exhibits  no edema.       L upper LS tenderness Also L buttock/ SI tenderness Nl rom hips  Neg SLR Flex 90 deg and ext 20 deg with pain  Also pain on L flex  Nl gait   Lymphadenopathy:    She has no cervical adenopathy.  Neurological: She is alert. She has normal strength and normal reflexes. No sensory deficit. She exhibits normal  muscle tone.  Skin: Skin is warm and dry. No rash noted. No erythema. No pallor.  Psychiatric:       Baseline MR Pleasant  Repeats herself          Assessment & Plan:

## 2011-01-12 LAB — POCT UA - MICROSCOPIC ONLY
RBC, urine, microscopic: 0
WBC, Ur, HPF, POC: 0

## 2011-01-13 ENCOUNTER — Telehealth: Payer: Self-pay | Admitting: Family Medicine

## 2011-01-13 NOTE — Telephone Encounter (Signed)
Triage Record Num: [id number] Operator: Gypsy Decant Patient Name: Linda Thompson Call Date & Time: 01/11/2011 3:39:43AM Patient Phone: [phone] PCP: Audrie Gallus. Tower Patient Gender: Female PCP Fax : Patient DOB: September 25, 1955 Practice Name: [facility] Reason for Call: Caller: [name]/Patient; PCP: [name]; CB#: [phone]; Call regarding Questions; Patient calling to find out if she can call if her nose starts bleeding on Christmas day. Advised she can reach a nurse in the practice 24 hours including Christmas day. Patient denies any problems with her health at this time. Protocol(s) Used: Office Note Recommended Outcome per Protocol: Information Noted and Sent to Office Reason for Outcome: Caller information to office

## 2011-01-15 ENCOUNTER — Telehealth: Payer: Self-pay | Admitting: Family Medicine

## 2011-01-15 ENCOUNTER — Encounter: Payer: Self-pay | Admitting: Family Medicine

## 2011-01-15 ENCOUNTER — Ambulatory Visit (INDEPENDENT_AMBULATORY_CARE_PROVIDER_SITE_OTHER): Payer: Medicare Other | Admitting: Family Medicine

## 2011-01-15 VITALS — BP 120/76 | HR 88 | Temp 97.5°F | Ht 62.0 in | Wt 202.5 lb

## 2011-01-15 DIAGNOSIS — B372 Candidiasis of skin and nail: Secondary | ICD-10-CM

## 2011-01-15 NOTE — Patient Instructions (Signed)
You have another yeast skin infection  I want you to keep the area very clean and dry (in fact after bathing you can use a hair dryer on the cool setting to dry it well)  Use the ketoconazole 2% cream twice daily - on both sides  Update me in a week if this does not improve or if it gets worse

## 2011-01-15 NOTE — Telephone Encounter (Signed)
Triage Record Num: [id number] Operator: Kelle Darting Patient Name: Linda Thompson Call Date & Time: 01/15/2011 12:09:51AM Patient Phone: [phone] PCP: Audrie Gallus. Tower Patient Gender: Female PCP Fax : Patient DOB: 07/30/1955 Practice Name: [facility] Reason for Call: Caller: [name]/Patient; PCP: [name]; CB#: [phone]; Call regarding Cough/Congestion; Afebrile; LMP: Hyst.; Onset: 01/14/11; Sx note: Had one episode of coughing tonight after having some burning in her throat, coughed up white sputum and nothing now, denies any pain or breathing difficulties; Guideline used: Cough; Disp: Home Care; Appt. made: No Protocol(s) Used: Cough - Adult Recommended Outcome per Protocol: Provide Home/Self Care Reason for Outcome: Productive cough AND clear or white sputum Care Advice: ~ Use a cool mist humidifier to moisten air. Be sure to clean according to manufacturer's instructions. ~ Call provider for evaluation of cough that lasts 2 weeks or more. Coughing up mucus or phlegm helps to get rid of an infection. A productive cough should not be stopped. A cough medicine with guaifenesin (Robitussin, Mucinex) can help loosen the mucus. Cough medicine with dextromethorphan (DM) should be avoided. Drinking lots of fluids can help loosen the mucus too, especially warm fluids. ~ Most adults need to drink 6-10 eight-ounce glasses (1.2-2.0 liters) of fluids per day unless previously told to limit fluid intake for other medical reasons. Limit fluids that contain caffeine, sugar or alcohol. Urine will be a very light yellow color when you drink enough fluids. ~ 01/15/2011 12:24:38AM Page 1 of 1 [id number]

## 2011-01-15 NOTE — Telephone Encounter (Signed)
Triage Record Num: [id number] Operator: Durward Mallard DiMatteis Patient Name: Linda Thompson Call Date & Time: 01/14/2011 10:16:55AM Patient Phone: [phone] PCP: Audrie Gallus. Tower Patient Gender: Female PCP Fax : Patient DOB: 1955/12/21 Practice Name: [facility] Reason for Call: Caller: [name]/Patient; PCP: [name]; CB#: [phone]; Call regarding patient wants to know if the Cyclobencatrine will make her urinate; pt saw MD last week and U/A was taken and no infection at that time; was started on Cyclobencatrine, Flexeril for back sx; having a hard time holding urine; no pain; no fever; triaged per Urinary Symptoms-Female Guideline; See in 24 hr d/t sx worsening; will call office at 0830 tomorrow for appt; will comply Protocol(s) Used: Urinary Symptoms - Female Recommended Outcome per Protocol: See Provider within 24 hours Reason for Outcome: Evaluated by provider AND symptoms worsening after following recommended treatment plan for 72 hours Care Advice: ~ SYMPTOM / CONDITION MANAGEMENT ~ Call provider if urine is pink, red, smoky or cola colored. 12/

## 2011-01-15 NOTE — Assessment & Plan Note (Signed)
Recurrent in obese pt who sweats Disc hygiene- see AVS- will keep area dry  Use ketoconazole cream 2% bid  If not imp in 1 week will update  Has mycalog oint to use as well - only if this does not work

## 2011-01-15 NOTE — Progress Notes (Signed)
Subjective:    Patient ID: Linda Thompson, female    DOB: Feb 15, 1955, 55 y.o.   MRN: [id number]  HPI Here to check area in between legs- perineal itching   On R side in fold of thigh A little redness Noticed it this am  No vaginal itching or discharge-- this is just on the outside   Sometimes she has to wear pads for urinary incontinence - ? This irritates it more   Used some of her ketoconazole 2%-one time --helped a bit   Patient Active Problem List  Diagnoses  . FUNGAL DERMATITIS  . MENINGIOMA  . DIABETES MELLITUS, TYPE II  . HYPERLIPIDEMIA  . MENTAL RETARDATION, MODERATE  . GLAUCOMA NOS  . HYPERTENSION  . ALLERGIC  RHINITIS  . GERD  . HIATAL HERNIA  . DERMATITIS, SEBORRHEIC NOS  . PRURITUS ANI  . OSTEOARTHRITIS, FOOT  . HEART MURMUR  . TRANSAMINASES, SERUM, ELEVATED  . Personal history of other musculoskeletal disorders  . IRRITABLE BOWEL SYNDROME  . ANKLE PAIN, LEFT  . Hypokalemia  . Back pain  . Candidal intertrigo  . Fecal incontinence  . Sore throat  . Epistaxis   Past Medical History  Diagnosis Date  . Allergic rhinitis, cause unspecified   . Pain in joint, ankle and foot   . Backache, unspecified     chronic LBP with radiculopathy  . Seborrheic dermatitis, unspecified   . Type II or unspecified type diabetes mellitus without mention of complication, not stated as uncontrolled   . History of fracture of arm     Right  . History of fracture of foot   . Dermatomycosis, unspecified   . Esophageal reflux   . Unspecified glaucoma   . Undiagnosed cardiac murmurs   . Diaphragmatic hernia without mention of obstruction or gangrene   . Other and unspecified hyperlipidemia   . Unspecified essential hypertension   . Irritable bowel syndrome   . Benign neoplasm of cerebral meninges   . Moderate intellectual disabilities   . Nausea alone   . Osteoarthrosis, unspecified whether generalized or localized, ankle and foot   . Pruritus ani   . Unspecified  tinnitus   . Nonspecific elevation of levels of transaminase or lactic acid dehydrogenase (LDH)   . Leukorrhea, not specified as infective   . Obesity   . Hyperglycemia   . DDD (degenerative disc disease), lumbar    Past Surgical History  Procedure Date  . Total abdominal hysterectomy   . Cataract extraction    History  Substance Use Topics  . Smoking status: Never Smoker   . Smokeless tobacco: Not on file  . Alcohol Use: No   Family History  Problem Relation Age of Onset  . Diabetes Mother   . Diabetes      Aunt  . Stroke      Aunt (had pacemaker)   Allergies  Allergen Reactions  . Ace Inhibitors     REACTION: COUGH  . Augmentin   . Cyclobenzaprine Hcl     REACTION: diarrhea  . Flonase (Fluticasone Propionate)     nosebleed  . Ibuprofen     REACTION: vomiting  . NWG:NFAOZHYQMVH+QIONGEXBM+WUXLKGMWNU Acid+Aspartame     REACTION: diarrhea   Current Outpatient Prescriptions on File Prior to Visit  Medication Sig Dispense Refill  . aspirin 81 MG tablet Take 81 mg by mouth daily.        . Calcium Carbonate-Vitamin D (CALCIUM-VITAMIN D) 500-200 MG-UNIT per tablet Take 1 tablet by mouth 2 (two)  times daily with a meal.        . diltiazem (CARDIZEM CD) 240 MG 24 hr capsule Take 1 capsule (240 mg total) by mouth daily.  30 capsule  11  . hydrochlorothiazide (MICROZIDE) 12.5 MG capsule Take 1 capsule (12.5 mg total) by mouth daily.  30 capsule  11  . ketoconazole (NIZORAL) 2 % cream APPLY SMALL AMOUNT TO AFFECTED AREA TWICE DAILY AS NEEDED  30 g  1  . ketoconazole (NIZORAL) 2 % shampoo USE AS DIRECTED TWICE WEEKLY PRN  120 mL  3  . metFORMIN (GLUCOPHAGE) 500 MG tablet Take 1 tablet (500 mg total) by mouth 2 (two) times daily with a meal.  30 tablet  11  . methylcellulose oral powder Take by mouth daily.        Marland Kitchen nystatin (MYCOSTATIN) powder Apply to affected area 3 times daily  15 g  0  . nystatin-triamcinolone (MYCOLOG) ointment Apply topically 2 (two) times daily.  30 g  0    . omeprazole (PRILOSEC) 20 MG capsule Take 1 capsule (20 mg total) by mouth daily.  30 capsule  11  . potassium chloride SA (KLOR-CON M20) 20 MEQ tablet Take 1 tablet (20 mEq total) by mouth daily.  30 tablet  11  . pravastatin (PRAVACHOL) 40 MG tablet Take 1 tablet (40 mg total) by mouth every evening.  30 tablet  11  . PROBIOTIC CAPS Take 1 capsule by mouth daily.        Marland Kitchen terconazole (TERAZOL 3) 0.8 % vaginal cream Apply to affected areas once daily    20 g  0  . cyclobenzaprine (FLEXERIL) 10 MG tablet Take 1 tablet (10 mg total) by mouth 2 (two) times daily as needed for muscle spasms.  20 tablet  0      Review of Systems Review of Systems  Constitutional: Negative for fever, appetite change, fatigue and unexpected weight change.  Eyes: Negative for pain and visual disturbance.  Respiratory: Negative for cough and shortness of breath.   Cardiovascular: Negative for cp or palpitations    Gastrointestinal: Negative for nausea, diarrhea and constipation.  Genitourinary: Negative for urgency and frequency.  Skin: Negative for pallor and pos for rash , itching   Neurological: Negative for weakness, light-headedness, numbness and headaches.  Hematological: Negative for adenopathy. Does not bruise/bleed easily.  Psychiatric/Behavioral: Negative for dysphoric mood. The patient is not nervous/anxious.          Objective:   Physical Exam  Constitutional: She appears well-developed and well-nourished. No distress.       overwt and well appearing   Hygiene is somewhat poor  HENT:  Head: Normocephalic and atraumatic.  Eyes: Conjunctivae and EOM are normal. Pupils are equal, round, and reactive to light.  Neck: Normal range of motion. Neck supple. No thyromegaly present.  Cardiovascular: Normal rate, regular rhythm and normal heart sounds.   Pulmonary/Chest: Effort normal and breath sounds normal.  Abdominal: Soft. Bowel sounds are normal.       No suprapubic tenderness     Musculoskeletal: She exhibits no edema.  Lymphadenopathy:    She has no cervical adenopathy.  Neurological: She is alert.  Skin: Skin is warm and dry. Rash noted. There is erythema.       Inguinal rash (in fold between thigh and labia ) - worse on R  Sharply demarcated with few satellite lesions  Generally moist and with cream already applied No areas of skin breakdown or excoriation  Assessment & Plan:

## 2011-01-15 NOTE — Telephone Encounter (Signed)
I saw her in clinic today

## 2011-01-17 ENCOUNTER — Encounter: Payer: Self-pay | Admitting: Family Medicine

## 2011-01-17 ENCOUNTER — Ambulatory Visit (INDEPENDENT_AMBULATORY_CARE_PROVIDER_SITE_OTHER): Payer: Medicare Other | Admitting: Family Medicine

## 2011-01-17 VITALS — BP 124/82 | HR 80 | Temp 97.3°F | Ht 62.0 in | Wt 199.5 lb

## 2011-01-17 DIAGNOSIS — K589 Irritable bowel syndrome without diarrhea: Secondary | ICD-10-CM

## 2011-01-17 NOTE — Assessment & Plan Note (Addendum)
With intermittent diarrhea/ constipation with cramping that is mild Currently symptom free -just one episode From today's reassuring hx and exam - I do not think this is infectious We had a long disc again about what IBS is and how symptoms can change  Will continue fiber, and yogurt for probiotics if it helps  Also printed handout inst to alert me if fever/ persistent pain --see AVS >25 min spent with face to face with patient, >50% counseling and/or coordinating care

## 2011-01-17 NOTE — Progress Notes (Signed)
Subjective:    Patient ID: Linda Thompson, female    DOB: 15-Dec-1955, 55 y.o.   MRN: [id number]  HPI Stomach started bothering her this am  Ate breakfast  bm 2 times --the 2nd time had a loose stool  Had pain in lower abd -- ? Cramping or dull  Better now  No blood in stool   No fever   Generally feels ok  No n/v or other symptoms  Did not eat anything out of the ordinary yesterday- no fatty foods Ate grits for breakfast  Still takes fiber every day  Patient Active Problem List  Diagnoses  . FUNGAL DERMATITIS  . MENINGIOMA  . DIABETES MELLITUS, TYPE II  . HYPERLIPIDEMIA  . MENTAL RETARDATION, MODERATE  . GLAUCOMA NOS  . HYPERTENSION  . ALLERGIC  RHINITIS  . GERD  . HIATAL HERNIA  . DERMATITIS, SEBORRHEIC NOS  . PRURITUS ANI  . OSTEOARTHRITIS, FOOT  . HEART MURMUR  . TRANSAMINASES, SERUM, ELEVATED  . Personal history of other musculoskeletal disorders  . IRRITABLE BOWEL SYNDROME  . ANKLE PAIN, LEFT  . Hypokalemia  . Back pain  . Candidal intertrigo  . Fecal incontinence  . Sore throat  . Epistaxis   Past Medical History  Diagnosis Date  . Allergic rhinitis, cause unspecified   . Pain in joint, ankle and foot   . Backache, unspecified     chronic LBP with radiculopathy  . Seborrheic dermatitis, unspecified   . Type II or unspecified type diabetes mellitus without mention of complication, not stated as uncontrolled   . History of fracture of arm     Right  . History of fracture of foot   . Dermatomycosis, unspecified   . Esophageal reflux   . Unspecified glaucoma   . Undiagnosed cardiac murmurs   . Diaphragmatic hernia without mention of obstruction or gangrene   . Other and unspecified hyperlipidemia   . Unspecified essential hypertension   . Irritable bowel syndrome   . Benign neoplasm of cerebral meninges   . Moderate intellectual disabilities   . Nausea alone   . Osteoarthrosis, unspecified whether generalized or localized, ankle and foot   .  Pruritus ani   . Unspecified tinnitus   . Nonspecific elevation of levels of transaminase or lactic acid dehydrogenase (LDH)   . Leukorrhea, not specified as infective   . Obesity   . Hyperglycemia   . DDD (degenerative disc disease), lumbar    Past Surgical History  Procedure Date  . Total abdominal hysterectomy   . Cataract extraction    History  Substance Use Topics  . Smoking status: Never Smoker   . Smokeless tobacco: Not on file  . Alcohol Use: No   Family History  Problem Relation Age of Onset  . Diabetes Mother   . Diabetes      Aunt  . Stroke      Aunt (had pacemaker)   Allergies  Allergen Reactions  . Ace Inhibitors     REACTION: COUGH  . Augmentin   . Cyclobenzaprine Hcl     REACTION: diarrhea  . Flonase (Fluticasone Propionate)     nosebleed  . Ibuprofen     REACTION: vomiting  . WJX:BJYNWGNFAOZ+HYQMVHQIO+NGEXBMWUXL Acid+Aspartame     REACTION: diarrhea   Current Outpatient Prescriptions on File Prior to Visit  Medication Sig Dispense Refill  . aspirin 81 MG tablet Take 81 mg by mouth daily.        . Calcium Carbonate-Vitamin D (CALCIUM-VITAMIN D) 500-200  MG-UNIT per tablet Take 1 tablet by mouth 2 (two) times daily with a meal.        . diltiazem (CARDIZEM CD) 240 MG 24 hr capsule Take 1 capsule (240 mg total) by mouth daily.  30 capsule  11  . hydrochlorothiazide (MICROZIDE) 12.5 MG capsule Take 1 capsule (12.5 mg total) by mouth daily.  30 capsule  11  . ketoconazole (NIZORAL) 2 % cream APPLY SMALL AMOUNT TO AFFECTED AREA TWICE DAILY AS NEEDED  30 g  1  . ketoconazole (NIZORAL) 2 % shampoo USE AS DIRECTED TWICE WEEKLY PRN  120 mL  3  . metFORMIN (GLUCOPHAGE) 500 MG tablet Take 1 tablet (500 mg total) by mouth 2 (two) times daily with a meal.  30 tablet  11  . methylcellulose oral powder Take by mouth daily.        Marland Kitchen omeprazole (PRILOSEC) 20 MG capsule Take 1 capsule (20 mg total) by mouth daily.  30 capsule  11  . potassium chloride SA (KLOR-CON M20)  20 MEQ tablet Take 1 tablet (20 mEq total) by mouth daily.  30 tablet  11  . pravastatin (PRAVACHOL) 40 MG tablet Take 1 tablet (40 mg total) by mouth every evening.  30 tablet  11  . PROBIOTIC CAPS Take 1 capsule by mouth daily.        Marland Kitchen terconazole (TERAZOL 3) 0.8 % vaginal cream Apply to affected areas once daily    20 g  0  . cyclobenzaprine (FLEXERIL) 10 MG tablet Take 1 tablet (10 mg total) by mouth 2 (two) times daily as needed for muscle spasms.  20 tablet  0  . nystatin (MYCOSTATIN) powder Apply to affected area 3 times daily  15 g  0  . nystatin-triamcinolone (MYCOLOG) ointment Apply topically 2 (two) times daily.  30 g  0      Review of Systems Review of Systems  Constitutional: Negative for fever, appetite change, fatigue and unexpected weight change.  Eyes: Negative for pain and visual disturbance.  Respiratory: Negative for cough and shortness of breath.   Cardiovascular: Negative for cp or palpitations    Gastrointestinal: Negative for nausea, and constipation. neg for blood in stool or persistent pain  Genitourinary: Negative for urgency and frequency.  Skin: Negative for pallor or rash   Neurological: Negative for weakness, light-headedness, numbness and headaches.  Hematological: Negative for adenopathy. Does not bruise/bleed easily.  Psychiatric/Behavioral: Negative for dysphoric mood. The patient is not nervous/anxious.          Objective:   Physical Exam  Constitutional: She appears well-developed and well-nourished. No distress.       overwt and well appearing   HENT:  Head: Normocephalic and atraumatic.  Mouth/Throat: Oropharynx is clear and moist.  Eyes: Conjunctivae and EOM are normal. Pupils are equal, round, and reactive to light. No scleral icterus.  Neck: Normal range of motion. Neck supple. No JVD present. No thyromegaly present.  Cardiovascular: Normal rate, regular rhythm and normal heart sounds.   Pulmonary/Chest: Effort normal and breath sounds  normal. No respiratory distress. She has no wheezes.  Abdominal: Soft. Bowel sounds are normal. She exhibits no distension, no abdominal bruit and no mass. There is no tenderness. There is no rebound, no guarding and no CVA tenderness.  Musculoskeletal: She exhibits no edema.  Lymphadenopathy:    She has no cervical adenopathy.  Neurological: She is alert. She has normal reflexes. Coordination normal.  Skin: Skin is warm and dry. No rash noted.  No erythema. No pallor.  Psychiatric:       Baseline MR  Is a bit anxious today Repeats herself frequently          Assessment & Plan:

## 2011-01-17 NOTE — Patient Instructions (Signed)
I think you had a flare of your irritable bowel syndrome - this can cause diarrhea or constipation at times  Keep up good fluid intake and continue fiber Please alert me if fever/ persistent abdominal pain/ vomiting or worse symptoms     Irritable Bowel Syndrome Irritable Bowel Syndrome (IBS) is caused by a disturbance of normal bowel function. Other terms used are spastic colon, mucous colitis, and irritable colon. It does not require surgery, nor does it lead to cancer. There is no cure for IBS. But with proper diet, stress reduction, and medication, you will find that your problems (symptoms) will gradually disappear or improve. IBS is a common digestive disorder. It usually appears in late adolescence or early adulthood. Women develop it twice as often as men. CAUSES   After food has been digested and absorbed in the small intestine, waste material is moved into the colon (large intestine). In the colon, water and salts are absorbed from the undigested products coming from the small intestine. The remaining residue, or fecal material, is held for elimination. Under normal circumstances, gentle, rhythmic contractions on the bowel walls push the fecal material along the colon towards the rectum. In IBS, however, these contractions are irregular and poorly coordinated. The fecal material is either retained too long, resulting in constipation, or expelled too soon, producing diarrhea. SYMPTOMS   The most common symptom of IBS is pain. It is typically in the lower left side of the belly (abdomen). But it may occur anywhere in the abdomen. It can be felt as heartburn, backache, or even as a dull pain in the arms or shoulders. The pain comes from excessive bowel-muscle spasms and from the buildup of gas and fecal material in the colon. This pain:  Can range from sharp belly (abdominal) cramps to a dull, continuous ache.     Usually worsens soon after eating.     Is typically relieved by having a bowel  movement or passing gas.  Abdominal pain is usually accompanied by constipation. But it may also produce diarrhea. The diarrhea typically occurs right after a meal or upon arising in the morning. The stools are typically soft and watery. They are often flecked with secretions (mucus). Other symptoms of IBS include:  Bloating.     Loss of appetite.     Heartburn.    Feeling sick to your stomach (nausea).     Belching    Vomiting    Gas.  IBS may also cause a number of symptoms that are unrelated to the digestive system:  Fatigue.     Headaches.    Anxiety    Shortness of breath     Difficulty in concentrating.     Dizziness.  These symptoms tend to come and go. DIAGNOSIS   The symptoms of IBS closely mimic the symptoms of other, more serious digestive disorders. So your caregiver may wish to perform a variety of additional tests to exclude these disorders. He/she wants to be certain of learning what is wrong (diagnosis). The nature and purpose of each test will be explained to you. TREATMENT A number of medications are available to help correct bowel function and/or relieve bowel spasms and abdominal pain. Among the drugs available are:  Mild, non-irritating laxatives for severe constipation and to help restore normal bowel habits.     Specific anti-diarrheal medications to treat severe or prolonged diarrhea.     Anti-spasmodic agents to relieve intestinal cramps.     Your caregiver may also decide to  treat you with a mild tranquilizer or sedative during unusually stressful periods in your life.  The important thing to remember is that if any drug is prescribed for you, make sure that you take it exactly as directed. Make sure that your caregiver knows how well it worked for you. HOME CARE INSTRUCTIONS    Avoid foods that are high in fat or oils. Some examples ZOX:WRUEA cream, butter, frankfurters, sausage, and other fatty meats.     Avoid foods that have a laxative  effect, such as fruit, fruit juice, and dairy products.     Cut out carbonated drinks, chewing gum, and "gassy" foods, such as beans and cabbage. This may help relieve bloating and belching.     Bran taken with plenty of liquids may help relieve constipation.     Keep track of what foods seem to trigger your symptoms.     Avoid emotionally charged situations or circumstances that produce anxiety.     Start or continue exercising.     Get plenty of rest and sleep.  MAKE SURE YOU:    Understand these instructions.     Will watch your condition.     Will get help right away if you are not doing well or get worse.  Document Released: 01/06/2005 Document Revised: 09/18/2010 Document Reviewed: 08/27/2007 [facility] Patient Information 2012 [place], [place].

## 2011-01-24 ENCOUNTER — Emergency Department (INDEPENDENT_AMBULATORY_CARE_PROVIDER_SITE_OTHER)
Admission: EM | Admit: 2011-01-24 | Discharge: 2011-01-24 | Disposition: A | Discharge status: Home / Self Care | Payer: Medicare Other | Source: Home / Self Care | Attending: Emergency Medicine | Admitting: Emergency Medicine

## 2011-01-24 ENCOUNTER — Encounter (HOSPITAL_COMMUNITY): Payer: Self-pay

## 2011-01-24 DIAGNOSIS — J329 Chronic sinusitis, unspecified: Secondary | ICD-10-CM | POA: Diagnosis not present

## 2011-01-24 DIAGNOSIS — M549 Dorsalgia, unspecified: Secondary | ICD-10-CM | POA: Diagnosis not present

## 2011-01-24 MED ORDER — CAPSAICIN 0.075 % EX CREA: TOPICAL_CREAM | Freq: Three times a day (TID) | CUTANEOUS | Status: DC

## 2011-01-24 MED ORDER — LORATADINE 10 MG PO TABS: 10.0000 mg | ORAL_TABLET | Freq: Every day | ORAL | Status: DC

## 2011-01-24 NOTE — ED Provider Notes (Addendum)
History     CSN: [id number]  Arrival date & time 01/24/11  0865   First MD Initiated Contact with Patient 01/24/11 [phone]      Chief Complaint  Patient presents with  . Back Pain    (Consider location/radiation/quality/duration/timing/severity/associated sxs/prior treatment) HPI Comments: Saw my doctor last Monday cause was having pain on my back an a urinary infection, but they told me I had no urinary infection, she prescribed me flexeril and  And been using heating pads to my back" No fevers No vomiting No dysuria No recent falls  Patient is a 56 y.o. female presenting with back pain. The history is provided by the patient. The history is limited by the condition of the patient.  Back Pain  This is a recurrent problem. The current episode started more than 1 week ago. The problem occurs constantly. The problem has not changed since onset.The pain is associated with no known injury. The pain is present in the lumbar spine. The quality of the pain is described as aching. The pain is at a severity of 7/10. The pain is moderate. The symptoms are aggravated by bending and certain positions. The pain is the same all the time. Pertinent negatives include no fever, no numbness, no abdominal pain, no bowel incontinence, no dysuria, no paresis, no tingling and no weakness. She has tried heat for the symptoms. The treatment provided no relief. Risk factors include obesity and lack of exercise.    Past Medical History  Diagnosis Date  . Allergic rhinitis, cause unspecified   . Pain in joint, ankle and foot   . Backache, unspecified     chronic LBP with radiculopathy  . Seborrheic dermatitis, unspecified   . Type II or unspecified type diabetes mellitus without mention of complication, not stated as uncontrolled   . History of fracture of arm     Right  . History of fracture of foot   . Dermatomycosis, unspecified   . Esophageal reflux   . Unspecified glaucoma   . Undiagnosed cardiac  murmurs   . Diaphragmatic hernia without mention of obstruction or gangrene   . Other and unspecified hyperlipidemia   . Unspecified essential hypertension   . Irritable bowel syndrome   . Benign neoplasm of cerebral meninges   . Moderate intellectual disabilities   . Nausea alone   . Osteoarthrosis, unspecified whether generalized or localized, ankle and foot   . Pruritus ani   . Unspecified tinnitus   . Nonspecific elevation of levels of transaminase or lactic acid dehydrogenase (LDH)   . Leukorrhea, not specified as infective   . Obesity   . Hyperglycemia   . DDD (degenerative disc disease), lumbar     Past Surgical History  Procedure Date  . Total abdominal hysterectomy   . Cataract extraction     Family History  Problem Relation Age of Onset  . Diabetes Mother   . Diabetes      Aunt  . Stroke      Aunt (had pacemaker)    History  Substance Use Topics  . Smoking status: Never Smoker   . Smokeless tobacco: Not on file  . Alcohol Use: No    OB History    Grav Para Term Preterm Abortions TAB SAB Ect Mult Living                  Review of Systems  Constitutional: Negative for fever.  Gastrointestinal: Negative for abdominal pain and bowel incontinence.  Genitourinary: Negative for  dysuria.  Musculoskeletal: Positive for back pain.  Neurological: Negative for tingling, weakness and numbness.    Allergies  Ace inhibitors; Augmentin; Cyclobenzaprine hcl; Flonase; Ibuprofen; and UJW:JXBJYNWGNFA+OZHYQMVHQ+IONGEXBMWU acid+aspartame  Home Medications   Current Outpatient Rx  Name Route Sig Dispense Refill  . ASPIRIN 81 MG PO TABS Oral Take 81 mg by mouth daily.      Marland Kitchen CALCIUM-VITAMIN D 500-200 MG-UNIT PO TABS Oral Take 1 tablet by mouth 2 (two) times daily with a meal.      . DILTIAZEM HCL ER COATED BEADS 240 MG PO CP24 Oral Take 1 capsule (240 mg total) by mouth daily. 30 capsule 11  . HYDROCHLOROTHIAZIDE 12.5 MG PO CAPS Oral Take 1 capsule (12.5 mg total) by  mouth daily. 30 capsule 11  . METFORMIN HCL 500 MG PO TABS Oral Take 1 tablet (500 mg total) by mouth 2 (two) times daily with a meal. 30 tablet 11  . OMEPRAZOLE 20 MG PO CPDR Oral Take 1 capsule (20 mg total) by mouth daily. 30 capsule 11  . POTASSIUM CHLORIDE CRYS ER 20 MEQ PO TBCR Oral Take 1 tablet (20 mEq total) by mouth daily. 30 tablet 11  . PRAVASTATIN SODIUM 40 MG PO TABS Oral Take 1 tablet (40 mg total) by mouth every evening. 30 tablet 11  . CAPSAICIN 0.075 % EX CREA Topical Apply topically 3 (three) times daily. Apply -rub on affected area for 5-7 days 28.3 g 0  . KETOCONAZOLE 2 % EX CREA  APPLY SMALL AMOUNT TO AFFECTED AREA TWICE DAILY AS NEEDED 30 g 1  . KETOCONAZOLE 2 % EX SHAM  USE AS DIRECTED TWICE WEEKLY PRN 120 mL 3  . LORATADINE 10 MG PO TABS Oral Take 1 tablet (10 mg total) by mouth daily. 7 tablet 0  . METHYLCELLULOSE (LAXATIVE) PO POWD Oral Take by mouth daily.      . NYSTATIN 100000 UNIT/GM EX POWD  Apply to affected area 3 times daily 15 g 0  . NYSTATIN-TRIAMCINOLONE 100000-0.1 UNIT/GM-% EX OINT Topical Apply topically 2 (two) times daily. 30 g 0  . PROBIOTIC PO CAPS Oral Take 1 capsule by mouth daily.      . TERCONAZOLE 0.8 % VA CREA  Apply to affected areas once daily   20 g 0    BP 131/79  Pulse 108  Temp(Src) 97.7 F (36.5 C) (Oral)  Resp 18  SpO2 96%  Physical Exam  Vitals reviewed. Constitutional: She appears well-developed and well-nourished. No distress.  Pulmonary/Chest: Breath sounds normal. No respiratory distress.  Musculoskeletal: She exhibits tenderness.       Lumbar back: She exhibits decreased range of motion, tenderness and pain. She exhibits no swelling and no edema.       Back:  Neurological: She is alert.  Skin: No rash noted. No erythema.    ED Course  Procedures (including critical care time)  Labs Reviewed - No data to display No results found.   1. Back pain   2. Sinusitis       MDM  Lower back pain reproducible with  palpation and movement        Jimmie Molly, MD 01/24/11 1841  Jimmie Molly, MD 01/24/11 1324

## 2011-01-24 NOTE — ED Notes (Signed)
C/o lt lower back pain.  States she saw her PCP on 01/10/11 for same because she thought she had UTI.  Was prescribed cyclobenzaprine.  Here today because her back is still bothering her. Took cyclobenzaprine and applied heat to low back last night.

## 2011-01-27 ENCOUNTER — Encounter: Payer: Self-pay | Admitting: Family Medicine

## 2011-01-27 ENCOUNTER — Other Ambulatory Visit: Payer: Self-pay | Admitting: Family Medicine

## 2011-01-27 ENCOUNTER — Ambulatory Visit (INDEPENDENT_AMBULATORY_CARE_PROVIDER_SITE_OTHER): Payer: Medicare Other | Admitting: Family Medicine

## 2011-01-27 VITALS — BP 124/84 | HR 96 | Temp 97.3°F | Ht 62.0 in | Wt 202.0 lb

## 2011-01-27 DIAGNOSIS — R350 Frequency of micturition: Secondary | ICD-10-CM | POA: Diagnosis not present

## 2011-01-27 DIAGNOSIS — M549 Dorsalgia, unspecified: Secondary | ICD-10-CM | POA: Diagnosis not present

## 2011-01-27 LAB — POCT URINALYSIS DIPSTICK
Bilirubin, UA: NEGATIVE
Glucose, UA: NEGATIVE
Ketones, UA: NEGATIVE
Nitrite, UA: NEGATIVE

## 2011-01-27 LAB — POCT UA - MICROSCOPIC ONLY
Bacteria, U Microscopic: 0
RBC, urine, microscopic: 0
WBC, Ur, HPF, POC: 0

## 2011-01-27 MED ORDER — DILTIAZEM HCL ER COATED BEADS 240 MG PO CP24: 240.0000 mg | ORAL_CAPSULE | Freq: Every day | ORAL | Status: DC

## 2011-01-27 MED ORDER — OMEPRAZOLE 20 MG PO CPDR: 20.0000 mg | DELAYED_RELEASE_CAPSULE | Freq: Every day | ORAL | Status: DC

## 2011-01-27 MED ORDER — HYDROCHLOROTHIAZIDE 12.5 MG PO CAPS: 12.5000 mg | ORAL_CAPSULE | Freq: Every day | ORAL | Status: DC

## 2011-01-27 NOTE — Assessment & Plan Note (Signed)
Recurrent back pain with hx of deg disk/ spondylosis - has had inj from Dr Janell Quiet ua - no longer any urinary symptoms I agree with UC that this is muscular Has flexeril to use with caution / and enc heating pad 10 min at a time  Also walking- disc plan for that  Will update if worse - and consider f/u Dr Ophelia Charter

## 2011-01-27 NOTE — Progress Notes (Signed)
Subjective:    Patient ID: Linda Thompson, female    DOB: 1956-01-12, 56 y.o.   MRN: [id number]  HPI Here for back pain and also frequent urination She went to urgent care in [place] -- on Friday  The doctor examined her back  Never "had a chance" to give a urine specimen  Was recommended capsacin cream -- on her back -- but she has not started that yet because pain is better  (sounds like they thought it was muscular)  Now is using heating pad  Back is hurting down low in middle No ref to legs   Does urinate often  No burning or blood or odor No fever or nausea    ua today is negative   Patient Active Problem List  Diagnoses  . FUNGAL DERMATITIS  . MENINGIOMA  . DIABETES MELLITUS, TYPE II  . HYPERLIPIDEMIA  . MENTAL RETARDATION, MODERATE  . GLAUCOMA NOS  . HYPERTENSION  . ALLERGIC  RHINITIS  . GERD  . HIATAL HERNIA  . DERMATITIS, SEBORRHEIC NOS  . PRURITUS ANI  . OSTEOARTHRITIS, FOOT  . HEART MURMUR  . TRANSAMINASES, SERUM, ELEVATED  . Personal history of other musculoskeletal disorders  . IRRITABLE BOWEL SYNDROME  . ANKLE PAIN, LEFT  . Hypokalemia  . Back pain  . Candidal intertrigo  . Fecal incontinence  . Sore throat  . Epistaxis   Past Medical History  Diagnosis Date  . Allergic rhinitis, cause unspecified   . Pain in joint, ankle and foot   . Backache, unspecified     chronic LBP with radiculopathy  . Seborrheic dermatitis, unspecified   . Type II or unspecified type diabetes mellitus without mention of complication, not stated as uncontrolled   . History of fracture of arm     Right  . History of fracture of foot   . Dermatomycosis, unspecified   . Esophageal reflux   . Unspecified glaucoma   . Undiagnosed cardiac murmurs   . Diaphragmatic hernia without mention of obstruction or gangrene   . Other and unspecified hyperlipidemia   . Unspecified essential hypertension   . Irritable bowel syndrome   . Benign neoplasm of cerebral meninges     . Moderate intellectual disabilities   . Nausea alone   . Osteoarthrosis, unspecified whether generalized or localized, ankle and foot   . Pruritus ani   . Unspecified tinnitus   . Nonspecific elevation of levels of transaminase or lactic acid dehydrogenase (LDH)   . Leukorrhea, not specified as infective   . Obesity   . Hyperglycemia   . DDD (degenerative disc disease), lumbar    Past Surgical History  Procedure Date  . Total abdominal hysterectomy   . Cataract extraction    History  Substance Use Topics  . Smoking status: Never Smoker   . Smokeless tobacco: Not on file  . Alcohol Use: No   Family History  Problem Relation Age of Onset  . Diabetes Mother   . Diabetes      Aunt  . Stroke      Aunt (had pacemaker)   Allergies  Allergen Reactions  . Ace Inhibitors     REACTION: COUGH  . Augmentin   . Cyclobenzaprine Hcl     REACTION: diarrhea  . Flonase (Fluticasone Propionate)     nosebleed  . Ibuprofen     REACTION: vomiting  . WUJ:WJXBJYNWGNF+AOZHYQMVH+QIONGEXBMW Acid+Aspartame     REACTION: diarrhea   Current Outpatient Prescriptions on File Prior to Visit  Medication  Sig Dispense Refill  . aspirin 81 MG tablet Take 81 mg by mouth daily.        . Calcium Carbonate-Vitamin D (CALCIUM-VITAMIN D) 500-200 MG-UNIT per tablet Take 1 tablet by mouth 2 (two) times daily with a meal.        . ketoconazole (NIZORAL) 2 % shampoo USE AS DIRECTED TWICE WEEKLY PRN  120 mL  3  . loratadine (CLARITIN) 10 MG tablet Take 1 tablet (10 mg total) by mouth daily.  7 tablet  0  . metFORMIN (GLUCOPHAGE) 500 MG tablet Take 1 tablet (500 mg total) by mouth 2 (two) times daily with a meal.  30 tablet  11  . methylcellulose oral powder Take by mouth daily.        Marland Kitchen nystatin (MYCOSTATIN) powder Apply to affected area 3 times daily  15 g  0  . nystatin-triamcinolone (MYCOLOG) ointment Apply topically 2 (two) times daily.  30 g  0  . potassium chloride SA (KLOR-CON M20) 20 MEQ tablet Take  1 tablet (20 mEq total) by mouth daily.  30 tablet  11  . pravastatin (PRAVACHOL) 40 MG tablet Take 1 tablet (40 mg total) by mouth every evening.  30 tablet  11  . PROBIOTIC CAPS Take 1 capsule by mouth daily.        Marland Kitchen terconazole (TERAZOL 3) 0.8 % vaginal cream Apply to affected areas once daily    20 g  0  . capsicum (ZOSTRIX) 0.075 % topical cream Apply topically 3 (three) times daily. Apply -rub on affected area for 5-7 days  28.3 g  0  . ketoconazole (NIZORAL) 2 % cream APPLY SMALL AMOUNT TO AFFECTED AREA TWICE DAILY AS NEEDED  30 g  1        Review of Systems Review of Systems  Constitutional: Negative for fever, appetite change, fatigue and unexpected weight change.  Eyes: Negative for pain and visual disturbance.  Respiratory: Negative for cough and shortness of breath.   Cardiovascular: Negative for cp or palpitations    Gastrointestinal: Negative for nausea, diarrhea and constipation.  Genitourinary: Negative for urgency and dysuria, sometimes has frequency (not today), neg for hematuria  Skin: Negative for pallor or rash   MSK pos for low back pain , neg for joint swelling  Neurological: Negative for weakness, light-headedness, numbness and headaches.  Hematological: Negative for adenopathy. Does not bruise/bleed easily.  Psychiatric/Behavioral: Negative for dysphoric mood. The patient is not nervous/anxious.          Objective:   Physical Exam  Constitutional: She appears well-nourished. No distress.       overwt and well appearing   HENT:  Head: Normocephalic and atraumatic.  Mouth/Throat: Oropharynx is clear and moist.  Eyes: Conjunctivae and EOM are normal. Pupils are equal, round, and reactive to light.  Neck: Normal range of motion. Neck supple. No JVD present. Carotid bruit is not present. No thyromegaly present.  Cardiovascular: Normal rate, regular rhythm and normal heart sounds.   Pulmonary/Chest: Effort normal and breath sounds normal. No respiratory  distress. She has no wheezes.  Abdominal: Soft. Bowel sounds are normal. She exhibits no distension and no mass. There is no tenderness.       No suprapubic tenderness    Musculoskeletal: She exhibits tenderness. She exhibits no edema.       Mild L3- L5 tenderness and L muscular spasm/ also piriformis area  Lordosis is prominent SLR neg bilat  Nl gait  Nl rom hips  LS  flex 90 deg/ ext 20 deg and nl lateral flex   Lymphadenopathy:    She has no cervical adenopathy.  Neurological: She is alert. She has normal strength and normal reflexes. She displays no atrophy. No sensory deficit. She exhibits normal muscle tone. Coordination normal.  Skin: Skin is warm and dry. No rash noted. No erythema. No pallor.  Psychiatric:       MR baseline - often repeats herself           Assessment & Plan:

## 2011-01-27 NOTE — Patient Instructions (Signed)
I'm glad your back is feeling better Use the heating pad if it helps  Stretch your back when you can and try to walk regularly   Back Pain, Adult Back pain is very common. The pain often gets better over time. The cause of back pain is usually not dangerous. Most people can learn to manage their back pain on their own.   HOME CARE    Stay active. Start with short walks on flat ground if you can. Try to walk farther each day.     Do not sit, drive, or stand in one place for more than 30 minutes. Do not stay in bed.     Do not avoid exercise or work. Activity can help your back heal faster.     Be careful when you bend or lift an object. Bend at your knees, keep the object close to you, and do not twist.     Sleep on a firm mattress. Lie on your side, and bend your knees. If you lie on your back, put a pillow under your knees.     Only take medicines as told by your doctor.     Put ice on the injured area.     Put ice in a plastic bag.     Place a towel between your skin and the bag.     Leave the ice on for 15 to 20 minutes, 3 to 4 times a day for the first 2 to 3 days. After that, you can switch between ice and heat packs.     Ask your doctor about back exercises or massage.     Avoid feeling anxious or stressed. Find good ways to deal with stress, such as exercise.  GET HELP RIGHT AWAY IF:    Your pain does not go away with rest or medicine.     Your pain does not go away in 1 week.     You have new problems.     You do not feel well.     The pain spreads into your legs.     You cannot control when you poop (bowel movement) or pee (urinate).     Your arms or legs feel weak or lose feeling (numbness).     You feel sick to your stomach (nauseous) or throw up (vomit).     You have belly (abdominal) pain.     You feel like you may pass out (faint).  MAKE SURE YOU:    Understand these instructions.     Will watch your condition.     Will get help right away if  you are not doing well or get worse.  Document Released: 06/25/2007 Document Revised: 09/18/2010 Document Reviewed: 05/27/2010 [facility] Patient Information 2012 [place], [place].

## 2011-01-30 ENCOUNTER — Other Ambulatory Visit: Payer: Self-pay | Admitting: Family Medicine

## 2011-01-30 NOTE — Telephone Encounter (Signed)
Will refill electronically  

## 2011-01-30 NOTE — Telephone Encounter (Signed)
Triage Record Num: [id number] Operator: Edgar Frisk Patient Name: Linda Thompson Call Date & Time: 01/30/2011 4:00:33AM Patient Phone: [phone] PCP: Audrie Gallus. Tower Patient Gender: Female PCP Fax : Patient DOB: 10/05/1955 Practice Name: [facility] Reason for Call: Caller: [name]; PCP: [name]; CB#: [phone]; Call regarding Feels like she is going to vomit ; States she has cough this am. States has been coughing till she feels as if she will vomit. No SOB, no other symptoms State was seen recently for nosebleed Care advice per cough Guideline SS for call back reviewed Protocol(s) Used: Cough - Adult Recommended Outcome per Protocol: See Provider within 24 hours Reason for Outcome: Facial pain (fullness, pressure, worsens with bending over), frontal headache, yellow-green nasal discharge AND any temperature elevation Care Advice: ~ Use a cool mist humidifier to moisten air. Be sure to clean according to manufacturer's instructions. ~ See provider in 8 hours if redness, swelling and tenderness develops above or below eyes/near ears/over sinuses ~ Consider use of a saline nasal spray per package directions to help relieve nasal congestion. ~ SYMPTOM / CONDITION MANAGEMENT ~ CAUTIONS A warm, moist compress placed on face, over eyes for 15 to 20 minutes, 5 to 6 times a day, may help relieve the congestion. ~ Analgesic/Antipyretic Advice - Acetaminophen: Consider acetaminophen as directed on label or by pharmacist/provider for pain or fever PRECAUTIONS: - Use if there is no history of liver disease, alcoholism, or intake of three or more alcohol drinks per day - Only if approved by provider during pregnancy or when breastfeeding - During pregnancy, acetaminophen should not be taken more than 3 consecutive days without telling provider - Do not exceed recommended dose or frequency ~ Nasal Irrigation To Relieve Congestion: - Wash hands with soap and  water. - Add 1/2 teaspoon salt to 1 cup warm water to make saltwater solution. - Use a bulb syringe to draw up the saltwater solution. Turn the bulb upright to squeeze out any air. Fill the syringe until is full. - Bend over sink bowl and lean slightly toward sink. Gently insert the tip of the syringe into nostril about 1/2 inch. Point tip toward outer corner of eye and GENTLY squeeze bulb to squirt saltwater into nostril. Forceful irrigation to clear sinuses requires the use of sterile water or saline. - Let solution drain from nose. Solution may come out of the other nostril or even your mouth. Do two full syringes of solution in each nostril. - Rinse syringe in clean water several times. Be sure water comes out clear. Dry the bulb syringe and store in a container or plastic bag. ~ 01

## 2011-01-30 NOTE — Telephone Encounter (Signed)
[facility] request refill Cyclobenzaprine 10 mg. Pt said still some left lower back pain on and off. Pt said now pain level is 0 but wants to have med on hand incase muscle spasms come back. Please advise. Pt will ck with [facility] on Friday.

## 2011-01-31 ENCOUNTER — Other Ambulatory Visit: Payer: Self-pay | Admitting: *Deleted

## 2011-01-31 ENCOUNTER — Encounter: Payer: Self-pay | Admitting: Family Medicine

## 2011-01-31 ENCOUNTER — Ambulatory Visit (INDEPENDENT_AMBULATORY_CARE_PROVIDER_SITE_OTHER): Payer: Medicare Other | Admitting: Family Medicine

## 2011-01-31 DIAGNOSIS — R040 Epistaxis: Secondary | ICD-10-CM

## 2011-01-31 DIAGNOSIS — R05 Cough: Secondary | ICD-10-CM | POA: Insufficient documentation

## 2011-01-31 NOTE — Telephone Encounter (Signed)
rx pending e scribe/ will close this encounter

## 2011-01-31 NOTE — Progress Notes (Signed)
  Subjective:    Patient ID: Linda Thompson, female    DOB: 09-22-1955, 56 y.o.   MRN: [id number]  HPI CC: cough?  Recent eval at [facility], prescribed claritin and nasal saline.  Then saw PCP 01/24/2011 with lower back pain.  That has resolved.  Thursday morning woke up coughing, gagging from coughing.  No vomiting, fever/chills.  Dry cough.  Cough has significantly improved.  No HA, ear pain , tooth pain, sinus pain.  Possibly a bit more sinus congestion recently but not significant.  Doesn't know if may have choked on saliva.  Has been using vaseline in nares every day.  Also got humidifier and has been using nightly.  No more nose bleeds.  Hasn't started nasal saline.  Presents today because on call nurse told her to come in today.  However no note in chart from call a nurse.  Review of Systems Per HPI    Objective:   Physical Exam  Nursing note and vitals reviewed. Constitutional: She appears well-developed and well-nourished. No distress.  HENT:  Head: Normocephalic and atraumatic.  Right Ear: Hearing, tympanic membrane, external ear and ear canal normal.  Left Ear: Hearing, tympanic membrane, external ear and ear canal normal.  Nose: No mucosal edema or rhinorrhea. Right sinus exhibits no maxillary sinus tenderness and no frontal sinus tenderness. Left sinus exhibits no maxillary sinus tenderness and no frontal sinus tenderness.  Mouth/Throat: Uvula is midline, oropharynx is clear and moist and mucous membranes are normal. No oropharyngeal exudate, posterior oropharyngeal edema, posterior oropharyngeal erythema or tonsillar abscesses.       + nasal mucosal irritation  Eyes: Conjunctivae and EOM are normal. Pupils are equal, round, and reactive to light. No scleral icterus.  Neck: Normal range of motion. Neck supple.  Cardiovascular: Normal rate, regular rhythm, normal heart sounds and intact distal pulses.   No murmur heard. Pulmonary/Chest: Effort normal and breath sounds normal. No  respiratory distress. She has no wheezes. She has no rales.  Musculoskeletal: She exhibits no edema.  Lymphadenopathy:    She has no cervical adenopathy.  Skin: Skin is warm and dry. No rash noted.      Assessment & Plan:

## 2011-01-31 NOTE — Assessment & Plan Note (Signed)
rec continue vaseline, nasal saline, humidifier. Seems to have resolved. Some irritation nasal mucosa but no scab, active bleeding evaluated.

## 2011-01-31 NOTE — Assessment & Plan Note (Signed)
Isolated episode. I wonder if just choked on saliva leading to coughing fit. As no further sxs, no further eval recommended. reassured.

## 2011-01-31 NOTE — Patient Instructions (Addendum)
Things are looking good today. Continue humidifer, nasal saline, and vaseline and claritin if you want. Hold any nasal steroid for now. Good to see you today, call us with questions. I'm glad cough has resolved.

## 2011-02-03 ENCOUNTER — Other Ambulatory Visit: Payer: Self-pay | Admitting: Family Medicine

## 2011-02-04 ENCOUNTER — Encounter: Payer: Self-pay | Admitting: Family Medicine

## 2011-02-04 ENCOUNTER — Ambulatory Visit (INDEPENDENT_AMBULATORY_CARE_PROVIDER_SITE_OTHER): Payer: Medicare Other | Admitting: Family Medicine

## 2011-02-04 ENCOUNTER — Other Ambulatory Visit: Payer: Self-pay | Admitting: *Deleted

## 2011-02-04 VITALS — BP 110/84 | HR 84 | Temp 97.6°F | Ht 62.0 in | Wt 198.0 lb

## 2011-02-04 DIAGNOSIS — L290 Pruritus ani: Secondary | ICD-10-CM

## 2011-02-04 DIAGNOSIS — B372 Candidiasis of skin and nail: Secondary | ICD-10-CM | POA: Diagnosis not present

## 2011-02-04 DIAGNOSIS — E119 Type 2 diabetes mellitus without complications: Secondary | ICD-10-CM

## 2011-02-04 LAB — RENAL FUNCTION PANEL
BUN: 12 mg/dL (ref 6–23)
CO2: 26 mEq/L (ref 19–32)
Creatinine, Ser: 0.7 mg/dL (ref 0.4–1.2)
GFR: 90.34 mL/min (ref >60.00)
Glucose, Bld: 105 mg/dL — ABNORMAL HIGH (ref 70–99)
Sodium: 143 mEq/L (ref 135–145)

## 2011-02-04 NOTE — Assessment & Plan Note (Signed)
a1c today Rev diet - low glycemic Wt not optimal  Recurrent fungal infx F/u 2-4 wk

## 2011-02-04 NOTE — Telephone Encounter (Signed)
Received faxed refill request from pharmacy for Nystatin-Triamcinolone oint.. Is it okay to refill medication?

## 2011-02-04 NOTE — Telephone Encounter (Signed)
[facility] notified as instructed not to refill Nystatin Triamcinolone oint. Left v/m for pharmacist.

## 2011-02-04 NOTE — Assessment & Plan Note (Signed)
In groin R more than L Not vaginal  tx with mycostatin powder instead of the nizoral  Also stressed imp of good hygiene and keeping area dry - esp after bathing Re check 2-4 wk Check a1c today too Prone to this with questionable hygiene and DM

## 2011-02-04 NOTE — Assessment & Plan Note (Signed)
Today suspect part of her intertrigo  Use mycostatin powder  Will dry thoroughly after wiping also Re check 2-4 wk

## 2011-02-04 NOTE — Patient Instructions (Signed)
For the yeast skin infection you have now (groin and anal area) - stop the ketoconazole cream that I gave you last time and switch to the mycostatin powder  Use just the powder on both sides and on anal area twice a day and keep the area very very dry In fact - after bathing or wiping you can use a hair dryer on cool setting to dry it off  Update if not starting to improve in a week or if worsening   Labs today for diabetes Follow up in 2-4 weeks for a re check

## 2011-02-04 NOTE — Telephone Encounter (Signed)
Do not refil- she was here today and she had plenty of it with her

## 2011-02-04 NOTE — Progress Notes (Signed)
Subjective:    Patient ID: Linda Thompson, female    DOB: [birth year], 56 y.o.   MRN: [id number]  HPI Thinks she has yeast infection- in vaginal area - but just on the outside (hx of candidal intertrigo) In sweaty area  Has nizoral 2% cream (just helps some)  Itchy but no pain  No internal symptoms or discharge   Has on hand nystatin powder Also nystatin and cortisone cream and ointment  Not using either of those Does not wear panties all the time  Lab Results  Component Value Date   HGBA1C 7.0* 08/01/2010    She tries to avoid sugar Not much exercise  Is not able to check sugars due to mental diability On metformin  Is significantly overweight   Patient Active Problem List  Diagnoses  . FUNGAL DERMATITIS  . MENINGIOMA  . DIABETES MELLITUS, TYPE II  . HYPERLIPIDEMIA  . MENTAL RETARDATION, MODERATE  . GLAUCOMA NOS  . HYPERTENSION  . ALLERGIC  RHINITIS  . GERD  . HIATAL HERNIA  . DERMATITIS, SEBORRHEIC NOS  . PRURITUS ANI  . OSTEOARTHRITIS, FOOT  . HEART MURMUR  . TRANSAMINASES, SERUM, ELEVATED  . Personal history of other musculoskeletal disorders  . IRRITABLE BOWEL SYNDROME  . ANKLE PAIN, LEFT  . Hypokalemia  . Back pain  . Candidal intertrigo  . Fecal incontinence  . Sore throat  . Epistaxis  . Cough   Past Medical History  Diagnosis Date  . Allergic rhinitis, cause unspecified   . Pain in joint, ankle and foot   . Backache, unspecified     chronic LBP with radiculopathy  . Seborrheic dermatitis, unspecified   . Type II or unspecified type diabetes mellitus without mention of complication, not stated as uncontrolled   . History of fracture of arm     Right  . History of fracture of foot   . Dermatomycosis, unspecified   . Esophageal reflux   . Unspecified glaucoma   . Undiagnosed cardiac murmurs   . Diaphragmatic hernia without mention of obstruction or gangrene   . Other and unspecified hyperlipidemia   . Unspecified essential hypertension   .  Irritable bowel syndrome   . Benign neoplasm of cerebral meninges   . Moderate intellectual disabilities   . Nausea alone   . Osteoarthrosis, unspecified whether generalized or localized, ankle and foot   . Pruritus ani   . Unspecified tinnitus   . Nonspecific elevation of levels of transaminase or lactic acid dehydrogenase (LDH)   . Leukorrhea, not specified as infective   . Obesity   . Hyperglycemia   . DDD (degenerative disc disease), lumbar    Past Surgical History  Procedure Date  . Total abdominal hysterectomy   . Cataract extraction    History  Substance Use Topics  . Smoking status: Never Smoker   . Smokeless tobacco: Not on file  . Alcohol Use: No   Family History  Problem Relation Age of Onset  . Diabetes Mother   . Diabetes      Aunt  . Stroke      Aunt (had pacemaker)   Allergies  Allergen Reactions  . Ace Inhibitors     REACTION: COUGH  . Augmentin   . Cyclobenzaprine Hcl     REACTION: diarrhea  . Flonase (Fluticasone Propionate)     nosebleed  . Ibuprofen     REACTION: vomiting  . ZHY:[id number]+[id number]+WNUUVOZDGU Acid+Aspartame     REACTION: diarrhea   Current Outpatient Prescriptions on File  Prior to Visit  Medication Sig Dispense Refill  . aspirin 81 MG tablet Take 81 mg by mouth daily.        . Calcium Carbonate-Vitamin D (CALCIUM-VITAMIN D) 500-200 MG-UNIT per tablet Take 1 tablet by mouth 2 (two) times daily with a meal.        . diltiazem (CARDIZEM CD) 240 MG 24 hr capsule Take 1 capsule (240 mg total) by mouth daily.  30 capsule  11  . hydrochlorothiazide (MICROZIDE) 12.5 MG capsule Take 1 capsule (12.5 mg total) by mouth daily.  30 capsule  11  . ketoconazole (NIZORAL) 2 % shampoo USE AS DIRECTED TWICE WEEKLY PRN  120 mL  3  . KLOR-CON M20 20 MEQ tablet TAKE 1 TABLET EVERY DAY  30 tablet  3  . loratadine (CLARITIN) 10 MG tablet Take 1 tablet (10 mg total) by mouth daily.  7 tablet  0  . metFORMIN (GLUCOPHAGE) 500 MG tablet Take 1 tablet  (500 mg total) by mouth 2 (two) times daily with a meal.  30 tablet  11  . methylcellulose oral powder Take by mouth daily.        Marland Kitchen omeprazole (PRILOSEC) 20 MG capsule Take 1 capsule (20 mg total) by mouth daily.  30 capsule  11  . potassium chloride SA (KLOR-CON M20) 20 MEQ tablet Take 1 tablet (20 mEq total) by mouth daily.  30 tablet  11  . pravastatin (PRAVACHOL) 40 MG tablet Take 1 tablet (40 mg total) by mouth every evening.  30 tablet  11  . PROBIOTIC CAPS Take 1 capsule by mouth daily.        . capsicum (ZOSTRIX) 0.075 % topical cream Apply topically 3 (three) times daily. Apply -rub on affected area for 5-7 days  28.3 g  0  . cyclobenzaprine (FLEXERIL) 10 MG tablet Take 10 mg by mouth 2 (two) times daily as needed.      Marland Kitchen ketoconazole (NIZORAL) 2 % cream APPLY SMALL AMOUNT TO AFFECTED AREA TWICE DAILY AS NEEDED  30 g  0  . terconazole (TERAZOL 3) 0.8 % vaginal cream Apply to affected areas once daily    20 g  0      Review of Systems Review of Systems  Constitutional: Negative for fever, appetite change, fatigue and unexpected weight change.  Eyes: Negative for pain and visual disturbance.  Respiratory: Negative for cough and shortness of breath.   Cardiovascular: Negative for cp or palpitations    Gastrointestinal: Negative for nausea, diarrhea and constipation.  Genitourinary: Negative for urgency and frequency.  Skin: Negative for pallor and pos for rash and itching  Neurological: Negative for weakness, light-headedness, numbness and headaches.  Hematological: Negative for adenopathy. Does not bruise/bleed easily.  Psychiatric/Behavioral: Negative for dysphoric mood. The patient is not nervous/anxious.          Objective:   Physical Exam  Constitutional: She appears well-developed and well-nourished. No distress.       Obese and well appearing but with poor hygiene  HENT:  Head: Normocephalic and atraumatic.  Mouth/Throat: Oropharynx is clear and moist.  Eyes:  Conjunctivae and EOM are normal. Pupils are equal, round, and reactive to light. No scleral icterus.  Neck: Normal range of motion. Neck supple. No JVD present. No thyromegaly present.  Cardiovascular: Normal rate, regular rhythm and normal heart sounds.   Pulmonary/Chest: Effort normal and breath sounds normal.  Abdominal: Soft. Bowel sounds are normal.       No suprapubic tenderness  Musculoskeletal: She exhibits no edema.  Lymphadenopathy:    She has no cervical adenopathy.  Neurological: She is alert. She has normal reflexes.  Skin: Skin is warm and dry. Rash noted. There is erythema.       Confluent erythema with sharp borders in groin and upper thigh area -- on outside of labia  Also under both breasts worse on L  No satellite lesions No broken skin  Psychiatric: She has a normal mood and affect.       Baseline MR Repeats statements over and over           Assessment & Plan:

## 2011-02-06 ENCOUNTER — Other Ambulatory Visit: Payer: Self-pay | Admitting: *Deleted

## 2011-02-06 MED ORDER — NYSTATIN-TRIAMCINOLONE 100000-0.1 UNIT/GM-% EX OINT: TOPICAL_OINTMENT | Freq: Two times a day (BID) | CUTANEOUS | Status: DC

## 2011-02-06 MED ORDER — NYSTATIN 100000 UNIT/GM EX POWD: CUTANEOUS | Status: DC

## 2011-02-08 ENCOUNTER — Ambulatory Visit (INDEPENDENT_AMBULATORY_CARE_PROVIDER_SITE_OTHER): Payer: Medicare Other | Admitting: Family Medicine

## 2011-02-08 ENCOUNTER — Encounter: Payer: Self-pay | Admitting: Family Medicine

## 2011-02-08 VITALS — BP 120/70 | Temp 97.5°F | Wt 189.0 lb

## 2011-02-08 DIAGNOSIS — I10 Essential (primary) hypertension: Secondary | ICD-10-CM

## 2011-02-08 DIAGNOSIS — B372 Candidiasis of skin and nail: Secondary | ICD-10-CM | POA: Diagnosis not present

## 2011-02-08 MED ORDER — FLUCONAZOLE 150 MG PO TABS: 150.0000 mg | ORAL_TABLET | Freq: Every day | ORAL | Status: AC

## 2011-02-08 NOTE — Patient Instructions (Signed)
Yeast Infection of the Skin Some yeast on the skin is normal, but sometimes it causes an infection. If you have a yeast infection, it shows up as white or light brown patches on brown skin. You can see it better in the summer on tan skin. It causes light-colored holes in your suntan. It can happen on any area of the body. This cannot be passed from person to person. HOME CARE  Scrub your skin daily with a dandruff shampoo. Your rash may take a couple weeks to get well.   Do not scratch or itch the rash.  GET HELP RIGHT AWAY IF:   You get another infection from scratching. The skin may get warm, red, and may ooze fluid.   The infection does not seem to be getting better.  MAKE SURE YOU:  Understand these instructions.   Will watch your condition.   Will get help right away if you are not doing well or get worse.  Document Released: 12/20/2007 Document Revised: 09/18/2010 Document Reviewed: 12/20/2007 [facility] Patient Information 2012 [place], [place].   Go home and take a hot shower then blow dry skin and keep the area open to air for the rest of the day, continue the Nystatin powder as prescribed and start the Diflucan pill as well. Increase yogurt today

## 2011-02-08 NOTE — Assessment & Plan Note (Signed)
She has been struggling with this for some time. It was improving with the nystatin powder she been using twice daily for the last few days then she woke up this morning with significant increasing pain discomfort itching in her right forearm. It is beefy and erythematous with a serous drainage. We will have her continue her nystatin powder and give her Diflucan 150 mg tabs 2 once weekly x2 weeks. She is encouraged to go home and take a nice hot shower in the medial area open to air for the rest of the day. Report worsening symptoms to PMD. Avoid harsh soaps

## 2011-02-08 NOTE — Assessment & Plan Note (Signed)
Adequately controlled despite discomfort

## 2011-02-08 NOTE — Progress Notes (Signed)
Linda Thompson [id number] [birth year] 02/08/2011      Progress Note-Follow Up  Subjective  Chief Complaint  No chief complaint on file.   HPI  Patient is a 56 year old Caucasian female who is in today still living with candidal skin infections. She has tried multiple treatments in the recent past. This week she was started on nystatin powder twice daily and it was improving until she woke up this morning with increased swelling, redness itching and discomfort in the right groin. She has no lesions under her breasts in her left groin is asymptomatic. No fevers, chills, GI or GU complaints noted. No myalgias  Past Medical History  Diagnosis Date  . Allergic rhinitis, cause unspecified   . Pain in joint, ankle and foot   . Backache, unspecified     chronic LBP with radiculopathy  . Seborrheic dermatitis, unspecified   . Type II or unspecified type diabetes mellitus without mention of complication, not stated as uncontrolled   . History of fracture of arm     Right  . History of fracture of foot   . Dermatomycosis, unspecified   . Esophageal reflux   . Unspecified glaucoma   . Undiagnosed cardiac murmurs   . Diaphragmatic hernia without mention of obstruction or gangrene   . Other and unspecified hyperlipidemia   . Unspecified essential hypertension   . Irritable bowel syndrome   . Benign neoplasm of cerebral meninges   . Moderate intellectual disabilities   . Nausea alone   . Osteoarthrosis, unspecified whether generalized or localized, ankle and foot   . Pruritus ani   . Unspecified tinnitus   . Nonspecific elevation of levels of transaminase or lactic acid dehydrogenase (LDH)   . Leukorrhea, not specified as infective   . Obesity   . Hyperglycemia   . DDD (degenerative disc disease), lumbar     Past Surgical History  Procedure Date  . Total abdominal hysterectomy   . Cataract extraction     Family History  Problem Relation Age of Onset  . Diabetes Mother   .  Diabetes      Aunt  . Stroke      Aunt (had pacemaker)    History   Social History  . Marital Status: Single    Spouse Name: N/A    Number of Children: N/A  . Years of Education: N/A   Occupational History  . Disabled (from arm fracture)    Social History Main Topics  . Smoking status: Never Smoker   . Smokeless tobacco: Not on file  . Alcohol Use: No  . Drug Use: No  . Sexually Active: Not on file   Other Topics Concern  . Not on file   Social History Narrative   Lives alone-with help from neighborsCurrently-disability for arm fractureMs. Flippin (neighbor) is POA    Current Outpatient Prescriptions on File Prior to Visit  Medication Sig Dispense Refill  . aspirin 81 MG tablet Take 81 mg by mouth daily.        . Calcium Carbonate-Vitamin D (CALCIUM-VITAMIN D) 500-200 MG-UNIT per tablet Take 1 tablet by mouth 2 (two) times daily with a meal.        . capsicum (ZOSTRIX) 0.075 % topical cream Apply topically 3 (three) times daily. Apply -rub on affected area for 5-7 days  28.3 g  0  . cyclobenzaprine (FLEXERIL) 10 MG tablet Take 10 mg by mouth 2 (two) times daily as needed.      . diltiazem (CARDIZEM CD)  240 MG 24 hr capsule Take 1 capsule (240 mg total) by mouth daily.  30 capsule  11  . hydrochlorothiazide (MICROZIDE) 12.5 MG capsule Take 1 capsule (12.5 mg total) by mouth daily.  30 capsule  11  . ketoconazole (NIZORAL) 2 % cream APPLY SMALL AMOUNT TO AFFECTED AREA TWICE DAILY AS NEEDED  30 g  0  . ketoconazole (NIZORAL) 2 % shampoo USE AS DIRECTED TWICE WEEKLY PRN  120 mL  3  . KLOR-CON M20 20 MEQ tablet TAKE 1 TABLET EVERY DAY  30 tablet  3  . loratadine (CLARITIN) 10 MG tablet Take 1 tablet (10 mg total) by mouth daily.  7 tablet  0  . metFORMIN (GLUCOPHAGE) 500 MG tablet Take 1 tablet (500 mg total) by mouth 2 (two) times daily with a meal.  30 tablet  11  . methylcellulose oral powder Take by mouth daily.        Marland Kitchen nystatin (MYCOSTATIN) powder Apply to affected area  3 times daily  15 g  0  . nystatin-triamcinolone ointment (MYCOLOG) Apply topically 2 (two) times daily.  30 g  0  . omeprazole (PRILOSEC) 20 MG capsule Take 1 capsule (20 mg total) by mouth daily.  30 capsule  11  . potassium chloride SA (KLOR-CON M20) 20 MEQ tablet Take 1 tablet (20 mEq total) by mouth daily.  30 tablet  11  . pravastatin (PRAVACHOL) 40 MG tablet Take 1 tablet (40 mg total) by mouth every evening.  30 tablet  11  . PROBIOTIC CAPS Take 1 capsule by mouth daily.        Marland Kitchen terconazole (TERAZOL 3) 0.8 % vaginal cream Apply to affected areas once daily    20 g  0    Allergies  Allergen Reactions  . Ace Inhibitors     REACTION: COUGH  . Augmentin   . Cyclobenzaprine Hcl     REACTION: diarrhea  . Flonase (Fluticasone Propionate)     nosebleed  . Ibuprofen     REACTION: vomiting  . ZOX:[id number] Acid+Aspartame     REACTION: diarrhea    Review of Systems  Review of Systems  Constitutional: Negative for fever and malaise/fatigue.  HENT: Negative for congestion.   Eyes: Negative for discharge.  Respiratory: Negative for shortness of breath.   Cardiovascular: Negative for chest pain, palpitations and leg swelling.  Gastrointestinal: Negative for nausea, abdominal pain and diarrhea.  Genitourinary: Negative for dysuria.  Musculoskeletal: Negative for falls.  Skin: Negative for rash.  Neurological: Negative for loss of consciousness and headaches.  Endo/Heme/Allergies: Negative for polydipsia.  Psychiatric/Behavioral: Negative for depression and suicidal ideas. The patient is not nervous/anxious and does not have insomnia.     Objective  BP 120/70  Temp(Src) 97.5 F (36.4 C) (Oral)  Wt 189 lb (85.73 kg)  Physical Exam  Physical Exam  Constitutional: She is oriented to person, place, and time and well-developed, well-nourished, and in no distress. No distress.  HENT:  Head: Normocephalic and atraumatic.  Eyes: Conjunctivae are normal.    Neck: Neck supple. No thyromegaly present.  Cardiovascular: Normal rate, regular rhythm and normal heart sounds.   No murmur heard. Pulmonary/Chest: Effort normal and breath sounds normal. She has no wheezes.  Abdominal: She exhibits no distension and no mass.  Musculoskeletal: She exhibits no edema.  Lymphadenopathy:    She has no cervical adenopathy.  Neurological: She is alert and oriented to person, place, and time.  Skin: Skin is warm and dry. No rash  noted. She is not diaphoretic.  Psychiatric: Memory, affect and judgment normal.    Lab Results  Component Value Date   TSH 1.61 08/01/2010   Lab Results  Component Value Date   WBC 10.8* 08/01/2010   HGB 15.3* 08/01/2010   HCT 45.0 08/01/2010   MCV 83.4 08/01/2010   PLT 402* 08/01/2010   Lab Results  Component Value Date   CREATININE 0.7 02/04/2011   BUN 12 02/04/2011   NA 143 02/04/2011   K 3.9 02/04/2011   CL 108 02/04/2011   CO2 26 02/04/2011   Lab Results  Component Value Date   ALT 26 08/01/2010   AST 20 08/01/2010   ALKPHOS 147* 08/01/2010   BILITOT 0.1* 08/01/2010   Lab Results  Component Value Date   CHOL 165 08/01/2010   Lab Results  Component Value Date   HDL 42.00 08/01/2010   Lab Results  Component Value Date   LDLCALC 87 08/01/2010   Lab Results  Component Value Date   TRIG 180.0* 08/01/2010   Lab Results  Component Value Date   CHOLHDL 4 08/01/2010     Assessment & Plan  Candidal intertrigo She has been struggling with this for some time. It was improving with the nystatin powder she been using twice daily for the last few days then she woke up this morning with significant increasing pain discomfort itching in her right forearm. It is beefy and erythematous with a serous drainage. We will have her continue her nystatin powder and give her Diflucan 150 mg tabs 2 once weekly x2 weeks. She is encouraged to go home and take a nice hot shower in the medial area open to air for the rest of the day. Report  worsening symptoms to PMD. Avoid harsh soaps  HYPERTENSION Adequately controlled despite discomfort

## 2011-02-10 ENCOUNTER — Telehealth: Payer: Self-pay | Admitting: Family Medicine

## 2011-02-10 NOTE — Telephone Encounter (Signed)
Pt seen [facility] 02/08/11.

## 2011-02-10 NOTE — Telephone Encounter (Signed)
Triage Record Num: [id number] Operator: Meribeth Mattes Patient Name: Shawndra Clute Call Date & Time: 02/08/2011 7:36:35AM Patient Phone: [phone] PCP: Audrie Gallus. [place] Patient Gender: Female PCP Fax : Patient DOB: [birth year] Practice Name: [facility] Reason for Call: Caller: Kristyanna/Patient; PCP: [name]; CB#: [phone]; Call regarding Rash/Hives; yeast infection onset 02/02/11, was seen in office 02/04/11, prescribed cream, using cream, rash not any better, spreading, afebrile All emergent sx for Rash Protocol R/o. Pt to call [facility] office at 0900 for appt. Protocol(s) Used: Rash Recommended Outcome per Protocol: See Provider within 4 hours Reason for Outcome: Evaluated by provider AND symptoms worsening when following recommended treatment plan Care Advice: ~ 02/08/2011 7:46:53AM Page 1 of 1 [id number]

## 2011-02-12 ENCOUNTER — Encounter: Payer: Self-pay | Admitting: Family Medicine

## 2011-02-12 ENCOUNTER — Ambulatory Visit (INDEPENDENT_AMBULATORY_CARE_PROVIDER_SITE_OTHER): Payer: Medicare Other | Admitting: Family Medicine

## 2011-02-12 VITALS — BP 124/82 | HR 76 | Temp 97.5°F | Ht 62.0 in | Wt 200.2 lb

## 2011-02-12 DIAGNOSIS — K589 Irritable bowel syndrome without diarrhea: Secondary | ICD-10-CM | POA: Diagnosis not present

## 2011-02-12 DIAGNOSIS — B372 Candidiasis of skin and nail: Secondary | ICD-10-CM

## 2011-02-12 MED ORDER — FLUCONAZOLE 150 MG PO TABS: ORAL_TABLET | ORAL | Status: DC

## 2011-02-12 NOTE — Patient Instructions (Addendum)
For the yeast rash under breasts and in groin- take the diflucan 150 mg one pill once a week for 4 weeks (you will get 4 pills in the bottle)  Use the nystatin powder on rash 1-2 times per day and keep it dry   (if it gets worse I will refer you to a skin doctor) For bowels - go back to metamucil and continue yogurt  Update if not starting to improve in a week or if worsening   Follow up in 1 month so I can re check the rash

## 2011-02-12 NOTE — Progress Notes (Signed)
Subjective:    Patient ID: Linda Thompson, female    DOB: 08/21/1955, 56 y.o.   MRN: [id number]  HPI Here for f/u of intertrigo--- rash under breast and in perineal area and also diarrhea   Rash got better until sat am (using nystatin powder)  Saturday- it broke out - and then she went to the Saturday clinic  Saw Dr Rogelia Rohrer and she noted it was worse- and then was given diflucan 150  (2 pills once weekly for 2 weeks) Is starting to get better now   Is having more diarrhea again -- had 5 bm yesterday  ? What she ate  Really watery stools No abd pain at all No blood No fever or other symptoms   Patient Active Problem List  Diagnoses  . FUNGAL DERMATITIS  . MENINGIOMA  . DIABETES MELLITUS, TYPE II  . HYPERLIPIDEMIA  . MENTAL RETARDATION, MODERATE  . GLAUCOMA NOS  . HYPERTENSION  . ALLERGIC  RHINITIS  . GERD  . HIATAL HERNIA  . DERMATITIS, SEBORRHEIC NOS  . PRURITUS ANI  . OSTEOARTHRITIS, FOOT  . HEART MURMUR  . TRANSAMINASES, SERUM, ELEVATED  . Personal history of other musculoskeletal disorders  . IRRITABLE BOWEL SYNDROME  . ANKLE PAIN, LEFT  . Hypokalemia  . Back pain  . Candidal intertrigo  . Fecal incontinence  . Sore throat  . Epistaxis  . Cough   Past Medical History  Diagnosis Date  . Allergic rhinitis, cause unspecified   . Pain in joint, ankle and foot   . Backache, unspecified     chronic LBP with radiculopathy  . Seborrheic dermatitis, unspecified   . Type II or unspecified type diabetes mellitus without mention of complication, not stated as uncontrolled   . History of fracture of arm     Right  . History of fracture of foot   . Dermatomycosis, unspecified   . Esophageal reflux   . Unspecified glaucoma   . Undiagnosed cardiac murmurs   . Diaphragmatic hernia without mention of obstruction or gangrene   . Other and unspecified hyperlipidemia   . Unspecified essential hypertension   . Irritable bowel syndrome   . Benign neoplasm of cerebral  meninges   . Moderate intellectual disabilities   . Nausea alone   . Osteoarthrosis, unspecified whether generalized or localized, ankle and foot   . Pruritus ani   . Unspecified tinnitus   . Nonspecific elevation of levels of transaminase or lactic acid dehydrogenase (LDH)   . Leukorrhea, not specified as infective   . Obesity   . Hyperglycemia   . DDD (degenerative disc disease), lumbar    Past Surgical History  Procedure Date  . Total abdominal hysterectomy   . Cataract extraction    History  Substance Use Topics  . Smoking status: Never Smoker   . Smokeless tobacco: Not on file  . Alcohol Use: No   Family History  Problem Relation Age of Onset  . Diabetes Mother   . Diabetes      Aunt  . Stroke      Aunt (had pacemaker)   Allergies  Allergen Reactions  . Ace Inhibitors     REACTION: COUGH  . Augmentin   . Cyclobenzaprine Hcl     REACTION: diarrhea  . Flonase (Fluticasone Propionate)     nosebleed  . Ibuprofen     REACTION: vomiting  . WUJ:[id number]+AOZHYQMVH+QIONGEXBMW Acid+Aspartame     REACTION: diarrhea   Current Outpatient Prescriptions on File Prior to Visit  Medication Sig Dispense Refill  . aspirin 81 MG tablet Take 81 mg by mouth daily.        . Calcium Carbonate-Vitamin D (CALCIUM-VITAMIN D) 500-200 MG-UNIT per tablet Take 1 tablet by mouth 2 (two) times daily with a meal.        . diltiazem (CARDIZEM CD) 240 MG 24 hr capsule Take 1 capsule (240 mg total) by mouth daily.  30 capsule  11  . hydrochlorothiazide (MICROZIDE) 12.5 MG capsule Take 1 capsule (12.5 mg total) by mouth daily.  30 capsule  11  . ketoconazole (NIZORAL) 2 % shampoo USE AS DIRECTED TWICE WEEKLY PRN  120 mL  3  . KLOR-CON M20 20 MEQ tablet TAKE 1 TABLET EVERY DAY  30 tablet  3  . loratadine (CLARITIN) 10 MG tablet Take 1 tablet (10 mg total) by mouth daily.  7 tablet  0  . metFORMIN (GLUCOPHAGE) 500 MG tablet Take 1 tablet (500 mg total) by mouth 2 (two) times daily with a meal.   30 tablet  11  . nystatin (MYCOSTATIN) powder Apply to affected area 3 times daily  15 g  0  . omeprazole (PRILOSEC) 20 MG capsule Take 1 capsule (20 mg total) by mouth daily.  30 capsule  11  . potassium chloride SA (KLOR-CON M20) 20 MEQ tablet Take 1 tablet (20 mEq total) by mouth daily.  30 tablet  11  . pravastatin (PRAVACHOL) 40 MG tablet Take 1 tablet (40 mg total) by mouth every evening.  30 tablet  11  . PROBIOTIC CAPS Take 1 capsule by mouth daily.        . capsicum (ZOSTRIX) 0.075 % topical cream Apply topically 3 (three) times daily. Apply -rub on affected area for 5-7 days  28.3 g  0  . cyclobenzaprine (FLEXERIL) 10 MG tablet Take 10 mg by mouth 2 (two) times daily as needed.      Marland Kitchen ketoconazole (NIZORAL) 2 % cream APPLY SMALL AMOUNT TO AFFECTED AREA TWICE DAILY AS NEEDED  30 g  0  . methylcellulose oral powder Take by mouth daily.        Marland Kitchen nystatin-triamcinolone ointment (MYCOLOG) Apply topically 2 (two) times daily.  30 g  0  . terconazole (TERAZOL 3) 0.8 % vaginal cream Apply to affected areas once daily    20 g  0       Review of Systems Review of Systems  Constitutional: Negative for fever, appetite change, fatigue and unexpected weight change.  Eyes: Negative for pain and visual disturbance.  Respiratory: Negative for cough and shortness of breath.   Cardiovascular: Negative for cp or palpitations    Gastrointestinal: Negative for nausea, abd pain or blood in stool or dark stool.  Genitourinary: Negative for urgency and frequency.  Skin: Negative for pallor and pos for rash with itching  Neurological: Negative for weakness, light-headedness, numbness and headaches.  Hematological: Negative for adenopathy. Does not bruise/bleed easily.  Psychiatric/Behavioral: Negative for dysphoric mood. The patient is not nervous/anxious.          Objective:   Physical Exam  Constitutional: She appears well-developed and well-nourished. No distress.       overwt and well  appearing   HENT:  Head: Normocephalic and atraumatic.  Mouth/Throat: Oropharynx is clear and moist.       No thrush noted   Eyes: Conjunctivae and EOM are normal. Pupils are equal, round, and reactive to light. No scleral icterus.  Neck: Normal range of motion. Neck  supple. No JVD present. Carotid bruit is not present. No thyromegaly present.  Cardiovascular: Normal rate, regular rhythm and normal heart sounds.   Pulmonary/Chest: Effort normal and breath sounds normal. No respiratory distress. She has no wheezes.  Abdominal: Soft. Bowel sounds are normal. She exhibits no distension and no mass. There is no tenderness.       No suprapubic tenderness     Musculoskeletal: Normal range of motion. She exhibits no edema.  Lymphadenopathy:    She has no cervical adenopathy.  Neurological: She is alert. She has normal reflexes. She exhibits normal muscle tone. Coordination normal.  Skin: Skin is warm and dry. Rash noted. There is erythema. No pallor.       Rash under R breast is worse- brightly erythematous with sharp borders and some satellite lesions No skin breakdown Areas under L breast and in groin are much improved - with less erythema   Psychiatric: She has a normal mood and affect.       Baseline MR- repeats questions and statements frequently Voices understanding of instructions          Assessment & Plan:

## 2011-02-12 NOTE — Assessment & Plan Note (Addendum)
Loose stools yesterday and better today  Adv to take her fiber daily and also have one serving of yogurt daily  Disc IBS- given handout again Counseled extensively on expectations and limited tx options Disc diet  Update if not starting to improve in a week or if worsening

## 2011-02-12 NOTE — Assessment & Plan Note (Addendum)
Much improved in groin- still active under R breast  Rev notes from sat clinic with plan to treat orally- this started to work  Will tx with diflucan once weekly 150 mg for another 4 weeks and then f/u If not imp consider derm visit  Will continue the nystatin powder and keep it dry Update if not starting to improve in a week or if worsening

## 2011-02-13 ENCOUNTER — Emergency Department (INDEPENDENT_AMBULATORY_CARE_PROVIDER_SITE_OTHER)
Admission: EM | Admit: 2011-02-13 | Discharge: 2011-02-13 | Disposition: A | Discharge status: Home / Self Care | Payer: Medicare Other | Source: Home / Self Care | Attending: Emergency Medicine | Admitting: Emergency Medicine

## 2011-02-13 ENCOUNTER — Encounter (HOSPITAL_COMMUNITY): Payer: Self-pay | Admitting: Emergency Medicine

## 2011-02-13 ENCOUNTER — Telehealth: Payer: Self-pay

## 2011-02-13 DIAGNOSIS — R81 Glycosuria: Secondary | ICD-10-CM

## 2011-02-13 DIAGNOSIS — R197 Diarrhea, unspecified: Secondary | ICD-10-CM

## 2011-02-13 LAB — POCT URINALYSIS DIP (DEVICE)
Glucose, UA: 500 mg/dL — AB
Nitrite: NEGATIVE
Specific Gravity, Urine: 1.02 (ref 1.005–1.030)
Urobilinogen, UA: 0.2 mg/dL (ref 0.0–1.0)

## 2011-02-13 LAB — GLUCOSE, CAPILLARY: Glucose-Capillary: 98 mg/dL (ref 70–99)

## 2011-02-13 LAB — POCT PREGNANCY, URINE
Preg Test, Ur: NEGATIVE
Preg Test, Ur: NEGATIVE

## 2011-02-13 MED ORDER — CIPROFLOXACIN HCL 500 MG PO TABS: 500.0000 mg | ORAL_TABLET | Freq: Two times a day (BID) | ORAL | Status: AC

## 2011-02-13 NOTE — Telephone Encounter (Signed)
Tried to contact pt but her phone is out of order. Will try again later.

## 2011-02-13 NOTE — ED Notes (Signed)
Patient reports it is ok to leave message on answering machine

## 2011-02-13 NOTE — ED Provider Notes (Signed)
History     CSN: [id number]  Arrival date & time 02/13/11  1308   First MD Initiated Contact with Patient 02/13/11 0825      Chief Complaint  Patient presents with  . Urinary Tract Infection    (Consider location/radiation/quality/duration/timing/severity/associated sxs/prior treatment) HPI Comments: Started last night with back pain and stomach pain, points to suprapubic area and been urinating a lot, also had two diarrheas today this morning"  No fevers, No vomiting, No nausea  Patient is a 56 y.o. female presenting with urinary tract infection. The history is provided by the patient.  Urinary Tract Infection This is a new problem. The problem occurs constantly. The problem has not changed since onset.Associated symptoms include abdominal pain. Pertinent negatives include no shortness of breath. The symptoms are aggravated by standing. The symptoms are relieved by rest. She has tried nothing for the symptoms. The treatment provided no relief.    Past Medical History  Diagnosis Date  . Allergic rhinitis, cause unspecified   . Pain in joint, ankle and foot   . Backache, unspecified     chronic LBP with radiculopathy  . Seborrheic dermatitis, unspecified   . Type II or unspecified type diabetes mellitus without mention of complication, not stated as uncontrolled   . History of fracture of arm     Right  . History of fracture of foot   . Dermatomycosis, unspecified   . Esophageal reflux   . Unspecified glaucoma   . Undiagnosed cardiac murmurs   . Diaphragmatic hernia without mention of obstruction or gangrene   . Other and unspecified hyperlipidemia   . Unspecified essential hypertension   . Irritable bowel syndrome   . Benign neoplasm of cerebral meninges   . Moderate intellectual disabilities   . Nausea alone   . Osteoarthrosis, unspecified whether generalized or localized, ankle and foot   . Pruritus ani   . Unspecified tinnitus   . Nonspecific elevation of levels of  transaminase or lactic acid dehydrogenase (LDH)   . Leukorrhea, not specified as infective   . Obesity   . Hyperglycemia   . DDD (degenerative disc disease), lumbar     Past Surgical History  Procedure Date  . Total abdominal hysterectomy   . Cataract extraction     Family History  Problem Relation Age of Onset  . Diabetes Mother   . Diabetes      Aunt  . Stroke      Aunt (had pacemaker)    History  Substance Use Topics  . Smoking status: Never Smoker   . Smokeless tobacco: Not on file  . Alcohol Use: No    OB History    Grav Para Term Preterm Abortions TAB SAB Ect Mult Living                  Review of Systems  Constitutional: Negative for fever and activity change.  HENT: Negative for congestion and rhinorrhea.   Respiratory: Negative for cough and shortness of breath.   Gastrointestinal: Positive for abdominal pain.  Genitourinary: Positive for dysuria, frequency and difficulty urinating. Negative for flank pain and vaginal bleeding.    Allergies  Ace inhibitors; Augmentin; Cyclobenzaprine hcl; Flonase; Ibuprofen; and MVH:QIONGEXBMWU+XLKGMWNUU+VOZDGUYQIH acid+aspartame  Home Medications   Current Outpatient Rx  Name Route Sig Dispense Refill  . ASPIRIN 81 MG PO TABS Oral Take 81 mg by mouth daily.      Marland Kitchen CALCIUM-VITAMIN D 500-200 MG-UNIT PO TABS Oral Take 1 tablet by mouth 2 (two)  times daily with a meal.      . CAPSAICIN 0.075 % EX CREA Topical Apply topically 3 (three) times daily. Apply -rub on affected area for 5-7 days 28.3 g 0  . CIPROFLOXACIN HCL 500 MG PO TABS Oral Take 1 tablet (500 mg total) by mouth 2 (two) times daily. 10 tablet 0  . CYCLOBENZAPRINE HCL 10 MG PO TABS Oral Take 10 mg by mouth 2 (two) times daily as needed.    Marland Kitchen DILTIAZEM HCL ER COATED BEADS 240 MG PO CP24 Oral Take 1 capsule (240 mg total) by mouth daily. 30 capsule 11  . FLUCONAZOLE 150 MG PO TABS  Take one pill by mouth once weekly for 4 weeks 4 tablet 0  . HYDROCHLOROTHIAZIDE  12.5 MG PO CAPS Oral Take 1 capsule (12.5 mg total) by mouth daily. 30 capsule 11  . KETOCONAZOLE 2 % EX CREA  APPLY SMALL AMOUNT TO AFFECTED AREA TWICE DAILY AS NEEDED 30 g 0  . KETOCONAZOLE 2 % EX SHAM  USE AS DIRECTED TWICE WEEKLY PRN 120 mL 3  . KLOR-CON M20 20 MEQ PO TBCR  TAKE 1 TABLET EVERY DAY 30 tablet 3  . LORATADINE 10 MG PO TABS Oral Take 1 tablet (10 mg total) by mouth daily. 7 tablet 0  . METFORMIN HCL 500 MG PO TABS Oral Take 1 tablet (500 mg total) by mouth 2 (two) times daily with a meal. 30 tablet 11  . METHYLCELLULOSE (LAXATIVE) PO POWD Oral Take by mouth daily.      . NYSTATIN 100000 UNIT/GM EX POWD  Apply to affected area 3 times daily 15 g 0  . NYSTATIN-TRIAMCINOLONE 100000-0.1 UNIT/GM-% EX OINT Topical Apply topically 2 (two) times daily. 30 g 0  . OMEPRAZOLE 20 MG PO CPDR Oral Take 1 capsule (20 mg total) by mouth daily. 30 capsule 11  . POTASSIUM CHLORIDE CRYS ER 20 MEQ PO TBCR Oral Take 1 tablet (20 mEq total) by mouth daily. 30 tablet 11  . PRAVASTATIN SODIUM 40 MG PO TABS Oral Take 1 tablet (40 mg total) by mouth every evening. 30 tablet 11  . PROBIOTIC PO CAPS Oral Take 1 capsule by mouth daily.      . TERCONAZOLE 0.8 % VA CREA  Apply to affected areas once daily   20 g 0    BP 141/97  Pulse 98  Temp(Src) 97.8 F (36.6 C) (Oral)  Resp 16  SpO2 99%  Physical Exam  Nursing note and vitals reviewed. Constitutional: She appears well-developed and well-nourished.  Non-toxic appearance. She does not have a sickly appearance. She does not appear ill. No distress.  HENT:  Head: Atraumatic.  Eyes: Conjunctivae are normal. No scleral icterus.  Abdominal: Normal appearance. She exhibits no distension. There is tenderness in the suprapubic area. There is no rigidity, no guarding, no tenderness at McBurney's point and negative Murphy's sign.    Musculoskeletal: Normal range of motion. She exhibits tenderness.       Right shoulder: She exhibits tenderness and pain.  She exhibits normal range of motion, no swelling, no deformity, no spasm and normal strength.       Arms: Skin: No erythema.    ED Course  Procedures (including critical care time)  Labs Reviewed  POCT URINALYSIS DIP (DEVICE) - Abnormal; Notable for the following:    Glucose, UA 500 (*)    Leukocytes, UA TRACE (*) Biochemical Testing Only. Please order routine urinalysis from main lab if confirmatory testing is needed.  All other components within normal limits  POCT PREGNANCY, URINE  GLUCOSE, CAPILLARY  POCT PREGNANCY, URINE  POCT URINALYSIS DIPSTICK  POCT PREGNANCY, URINE  URINE CULTURE  POCT CBG MONITORING   No results found.   1. Glucosuria   2. Diarrhea       MDM  Dysuria with mild suprapubic reproducible pain, inconclusive urine (marked glucosuria) Symptoms for  24 aproximately, seen by PCP yesterday. Mental disability, difficult to discern if she has dysuria or not. Afebrile comfortable         Jimmie Molly, MD 02/13/11 1540

## 2011-02-13 NOTE — Telephone Encounter (Signed)
Pt came by office because her phone is not working. Pt was seen at [facility] today for UTI and give Cipro 500 mg to take 1 bid for 5 days. Pt saw Dr Milinda Antis 02/12/11 and give Diflucan to take one weekly for 4 weeks. Pt said she is till using Nystatin powder in perineal area also. Pt said diarrhea is better but perineal area seems more red and itchy on left side. Pt said pharmacist told her not to take Diflucan and Cipro at the same time. Pt wanted to confirm with Dr Milinda Antis if she can take Diflucan once weekly along with Cipro 500mg  which she will only take one tab bid for 5 days. Pt said if she did not here from Dr Lucretia Roers office by 4pm today because her phone is not working she will come back to the office today at 4pm. I called [facility] and spoke with pharmacist, [place] and she said no contraindication and pt can take Diflucan and Cipro at same time.Please advise. Pts # [phone].

## 2011-02-13 NOTE — Telephone Encounter (Signed)
It is ok to take them at the same time.

## 2011-02-13 NOTE — ED Notes (Signed)
Onset last night, lower back pain.  Pain is predominantly on the right side.  C/o urinating more frequently and more frequent stools.  No pain with urination

## 2011-02-14 ENCOUNTER — Telehealth: Payer: Self-pay | Admitting: Family Medicine

## 2011-02-14 NOTE — Telephone Encounter (Signed)
Pt came into the office and spoke with Rena about her meds. Pt is stating that she thinks her Cipro is too strong. She was wondering if the dosage could be changed.

## 2011-02-14 NOTE — Telephone Encounter (Signed)
If she is on 500 mg bid can cut to 250 mg bid -let her know  Let me know what side eff or symptoms she is having

## 2011-02-14 NOTE — Telephone Encounter (Signed)
Pt advised, she will cut in half, she says she is having diarrhea from the med.

## 2011-02-14 NOTE — Telephone Encounter (Signed)
Patient notified as instructed by telephone. 

## 2011-02-15 LAB — URINE CULTURE

## 2011-02-17 ENCOUNTER — Telehealth: Payer: Self-pay | Admitting: Family Medicine

## 2011-02-17 NOTE — Telephone Encounter (Signed)
Triage Record Num: [id number] Operator: Reino Bellis Patient Name: Linda Thompson Call Date & Time: 02/16/2011 12:31:48PM Patient Phone: [phone] PCP: Audrie Gallus. Tower Patient Gender: Female PCP Fax : Patient DOB: 1955/11/02 Practice Name: [facility] Reason for Call: Caller: [name]; PCP: [name]; CB#: [phone]; Call regarding Ciprolloxacin, taking it 500 milligrams was to strong and was told to splt it in half now wants to know when to stop taking the medication ; Pt. calling wanting to know when to stop taking Ciprofloxacin 500 mg. She has been taking 1/2 pill in AM and 1/2 pill in PM because a whole pill caused stomach upset. Pt. thought Monday (02/17/11) was supposed to be her last day but she still has medication left. Pt. made aware that antibiotics should be taken until they are gone. Pt. advised to continue taking medication like she has been told and to call office in AM on 02/17/11 to follow up with MD. Pt. verbalizes understanding. Protocol(s) Used: Office Note Recommended Outcome per Protocol: Information Noted and Sent to Office Reason for Outcome: Caller information to office Care Advice: ~ 02/16/2011 12:44:44PM Page 1 of 1 [email or web address]

## 2011-02-17 NOTE — Telephone Encounter (Signed)
Triage Record Num: [id number] Operator: Albertine Grates Patient Name: Linda Thompson Call Date & Time: 02/14/2011 6:11:10PM Patient Phone: [phone] PCP: Audrie Gallus. Tower Patient Gender: Female PCP Fax : Patient DOB: January 31, 1955 Practice Name: [facility] Reason for Call: Caller: Angeliyah/Patient; PCP: [name]; CB#: [phone]; Call regarding Medication Issue; Medication(s): Ciprofloxacin; Was seen in office 1-15 and told to use powder for yeast infection. Was seen at [facility] office 1-19 and given Diflucan to take for 3 days. Was seen at [facility] 1-24 and was told does not have UTI but was given Cipro. Has seen MD "this week" and told to take Diflucan daily for 1 week. Was seen again in office 1-24 as was not getting better. Was told to eat yougurt and use "sugar pill"daily. Was back in office 1-25 and told that MD has advised her to continue taking Cipro. Spoke to office again after getting home and taking medicine as caused her to have diarrhea. Office told her to cut tab in half and only take 1/2tab. HAs done this but has continued to have loose stool. Advised follow up UC 1-26. Protocol(s) Used: Diarrhea or Other Change in Bowel Habits Recommended Outcome per Protocol: See Provider within 24 hours Reason for Outcome: Diarrhea began 3-5 days after starting an antibiotic Care Advice: Go to the ED if you have developed signs and symptoms of dehydration such as very dry mouth and tongue; increased pulse rate at rest; no urine output for 8 hours or more; increasing weakness or drowsiness, or lightheadedness when trying to sit upright or standing. ~ Diarrheal Care: - Drink 2-3 quarts (2-3 liters) per day of low sugar content fluids, including over the counter oral hydration solution, unless directed otherwise by provider. - If accompanied by vomiting, take the fluids in frequent small sips or suck on ice chips. - Eat easily digested foods (such as bananas, rice, applesauce, toast,  cooked cereals, soup, crackers, baked or boiled potato, or baked chicken or [place] without skin). - Do not eat high fiber, high fat, high sugar content foods, or highly seasoned foods. - Do not drink caffeinated or alcoholic beverages. - Avoid milk and milk products while having symptoms. As symptoms improve, gradually add back to diet. - Application of A&D ointment or witch hazel medicated pads may help anal irritation. - Antidiarrheal medications are usually unnecessary. If symptoms are severe, consider nonprescription antidiarrheal and anti-motility drugs as directed by label or a provider. Do not take if have high fever or bloody diarrhea. If pregnant, do not take any medications not approved by your provider. - Consult your provider for advice regarding continuing prescription medication. ~ 02/14/2011 7:09:43PM Page 1 of 1 [email or web address]

## 2011-02-17 NOTE — Telephone Encounter (Signed)
Aware - pt to call if symptoms continue

## 2011-02-18 ENCOUNTER — Telehealth: Payer: Self-pay | Admitting: Family Medicine

## 2011-02-18 ENCOUNTER — Ambulatory Visit: Payer: Medicare Other | Admitting: Family Medicine

## 2011-02-18 NOTE — Telephone Encounter (Signed)
Not unless it gets worse Please let me know how the rest of the rash is looking  thanks

## 2011-02-18 NOTE — Telephone Encounter (Signed)
Patient said she's been taking the Diflucan.  She said that now she has an itch at the top of right leg.  Patient wants to know if she needs to schedule an appointment for you to look at her leg.   Patient uses [facility].

## 2011-02-18 NOTE — Telephone Encounter (Signed)
Patient notified as instructed by telephone. Pt said rash is looking better at her leg and under her breast. Pt said right now she is not itching. Pt will call back if symptoms worsen.

## 2011-02-20 ENCOUNTER — Other Ambulatory Visit: Payer: Self-pay | Admitting: Family Medicine

## 2011-02-25 ENCOUNTER — Ambulatory Visit (INDEPENDENT_AMBULATORY_CARE_PROVIDER_SITE_OTHER): Payer: Medicare Other | Admitting: Family Medicine

## 2011-02-25 ENCOUNTER — Encounter: Payer: Self-pay | Admitting: Family Medicine

## 2011-02-25 VITALS — BP 116/70 | HR 84 | Temp 97.5°F | Ht 62.0 in | Wt 200.0 lb

## 2011-02-25 DIAGNOSIS — B369 Superficial mycosis, unspecified: Secondary | ICD-10-CM | POA: Diagnosis not present

## 2011-02-25 DIAGNOSIS — B372 Candidiasis of skin and nail: Secondary | ICD-10-CM

## 2011-02-25 NOTE — Progress Notes (Signed)
Subjective:    Patient ID: Linda Thompson, female    DOB: [birth year], 56 y.o.   MRN: [id number]  HPI Here for f/u of intertrigo/ fungal skin infx (in pt with mild DM)  Got better and then got worse again  Took 1 diflucan once weekly for 3 weeks -has one more  Also using powder regularly   Still has rash under breasts and in perianal area  On itch inside or vaginal discharge  occ itches in anal area after bm   Had appt 25th for re check but came early  Patient Active Problem List  Diagnoses  . FUNGAL DERMATITIS  . MENINGIOMA  . DIABETES MELLITUS, TYPE II  . HYPERLIPIDEMIA  . MENTAL RETARDATION, MODERATE  . GLAUCOMA NOS  . HYPERTENSION  . ALLERGIC  RHINITIS  . GERD  . HIATAL HERNIA  . DERMATITIS, SEBORRHEIC NOS  . PRURITUS ANI  . OSTEOARTHRITIS, FOOT  . HEART MURMUR  . TRANSAMINASES, SERUM, ELEVATED  . Personal history of other musculoskeletal disorders  . IRRITABLE BOWEL SYNDROME  . ANKLE PAIN, LEFT  . Hypokalemia  . Back pain  . Candidal intertrigo  . Fecal incontinence   Past Medical History  Diagnosis Date  . Allergic rhinitis, cause unspecified   . Pain in joint, ankle and foot   . Backache, unspecified     chronic LBP with radiculopathy  . Seborrheic dermatitis, unspecified   . Type II or unspecified type diabetes mellitus without mention of complication, not stated as uncontrolled   . History of fracture of arm     Right  . History of fracture of foot   . Dermatomycosis, unspecified   . Esophageal reflux   . Unspecified glaucoma   . Undiagnosed cardiac murmurs   . Diaphragmatic hernia without mention of obstruction or gangrene   . Other and unspecified hyperlipidemia   . Unspecified essential hypertension   . Irritable bowel syndrome   . Benign neoplasm of cerebral meninges   . Moderate intellectual disabilities   . Nausea alone   . Osteoarthrosis, unspecified whether generalized or localized, ankle and foot   . Pruritus ani   . Unspecified  tinnitus   . Nonspecific elevation of levels of transaminase or lactic acid dehydrogenase (LDH)   . Leukorrhea, not specified as infective   . Obesity   . Hyperglycemia   . DDD (degenerative disc disease), lumbar    Past Surgical History  Procedure Date  . Total abdominal hysterectomy   . Cataract extraction    History  Substance Use Topics  . Smoking status: Never Smoker   . Smokeless tobacco: Not on file  . Alcohol Use: No   Family History  Problem Relation Age of Onset  . Diabetes Mother   . Diabetes      Aunt  . Stroke      Aunt (had pacemaker)   Allergies  Allergen Reactions  . Ace Inhibitors     REACTION: COUGH  . Augmentin   . Cyclobenzaprine Hcl     REACTION: diarrhea  . Flonase (Fluticasone Propionate)     nosebleed  . Ibuprofen     REACTION: vomiting  . VHQ:IONGEXBMWUX+[id number]+OZDGUYQIHK Acid+Aspartame     REACTION: diarrhea   Current Outpatient Prescriptions on File Prior to Visit  Medication Sig Dispense Refill  . aspirin 81 MG tablet Take 81 mg by mouth daily.        . Calcium Carbonate-Vitamin D (CALCIUM-VITAMIN D) 500-200 MG-UNIT per tablet Take 1 tablet by mouth 2 (  two) times daily with a meal.        . capsicum (ZOSTRIX) 0.075 % topical cream Apply topically 3 (three) times daily. Apply -rub on affected area for 5-7 days  28.3 g  0  . diltiazem (CARDIZEM CD) 240 MG 24 hr capsule Take 1 capsule (240 mg total) by mouth daily.  30 capsule  11  . fluconazole (DIFLUCAN) 150 MG tablet Take one pill by mouth once weekly for 4 weeks  4 tablet  0  . hydrochlorothiazide (MICROZIDE) 12.5 MG capsule Take 1 capsule (12.5 mg total) by mouth daily.  30 capsule  11  . ketoconazole (NIZORAL) 2 % shampoo USE AS DIRECTED TWICE WEEKLY PRN  120 mL  3  . KLOR-CON M20 20 MEQ tablet TAKE 1 TABLET EVERY DAY  30 tablet  3  . loratadine (CLARITIN) 10 MG tablet Take 1 tablet (10 mg total) by mouth daily.  7 tablet  0  . metFORMIN (GLUCOPHAGE) 500 MG tablet Take 1 tablet (500  mg total) by mouth 2 (two) times daily with a meal.  30 tablet  11  . methylcellulose oral powder Take by mouth daily.        Marland Kitchen nystatin (MYCOSTATIN) powder Apply to affected area 3 times daily  15 g  0  . omeprazole (PRILOSEC) 20 MG capsule Take 1 capsule (20 mg total) by mouth daily.  30 capsule  11  . potassium chloride SA (KLOR-CON M20) 20 MEQ tablet Take 1 tablet (20 mEq total) by mouth daily.  30 tablet  11  . pravastatin (PRAVACHOL) 40 MG tablet Take 1 tablet (40 mg total) by mouth every evening.  30 tablet  11  . PROBIOTIC CAPS Take 1 capsule by mouth daily.        Marland Kitchen terconazole (TERAZOL 3) 0.8 % vaginal cream Apply to affected areas once daily    20 g  0  . cyclobenzaprine (FLEXERIL) 10 MG tablet Take 10 mg by mouth 2 (two) times daily as needed.      Marland Kitchen ketoconazole (NIZORAL) 2 % cream APPLY SMALL AMOUNT TO AFFECTED AREA TWICE DAILY AS NEEDED  30 g  0  . nystatin-triamcinolone ointment (MYCOLOG) Apply topically 2 (two) times daily.  30 g  0     Review of Systems Review of Systems  Constitutional: Negative for fever, appetite change, fatigue and unexpected weight change.  Eyes: Negative for pain and visual disturbance.  Respiratory: Negative for cough and shortness of breath.   Cardiovascular: Negative for cp or palpitations    Gastrointestinal: Negative for nausea, diarrhea and constipation.  Genitourinary: Negative for urgency and frequency.  Skin: Negative for pallor and pos for rash/ itching  Neurological: Negative for weakness, light-headedness, numbness and headaches.  Hematological: Negative for adenopathy. Does not bruise/bleed easily.  Psychiatric/Behavioral: Negative for dysphoric mood. The patient is not nervous/anxious.          Objective:   Physical Exam  Constitutional: She appears well-developed and well-nourished. No distress.       overwt and well appearing   Eyes: Conjunctivae and EOM are normal. Pupils are equal, round, and reactive to light. No scleral  icterus.  Cardiovascular: Normal rate and regular rhythm.   Pulmonary/Chest: Effort normal and breath sounds normal. She has no wheezes.  Musculoskeletal: She exhibits no edema.  Neurological: She is alert.  Skin: Skin is warm and dry. Rash noted. There is erythema. No pallor.       Erythema with some raised areas and  sharp borders under L breast (improved under R) , also in inguinal folds bilat No excoriation or skin interruption seen  Hygiene is fair overall   Psychiatric:       Baseline MR Repeats herself frequently In good spirits           Assessment & Plan:

## 2011-02-25 NOTE — Assessment & Plan Note (Signed)
Stubborn and clearing briefly before re occuring  Has been on diflucan (1 month weekly) as well as nystatin powder and prev ketoconazole cream Pt is making effort towards good hygiene and drying areas  Today- worse under L breast and in groin area Will ref to derm inst to take her last diflucan

## 2011-02-25 NOTE — Patient Instructions (Signed)
We will refer you to dermatology at check out  Take your neighbor to that appointment  Take your last diflucan pill as planned Continue the powder  Keep areas as dry as possible

## 2011-02-25 NOTE — Assessment & Plan Note (Signed)
Acute on chronic See assessment for intertrigo  Ref to derm

## 2011-02-27 DIAGNOSIS — L538 Other specified erythematous conditions: Secondary | ICD-10-CM | POA: Diagnosis not present

## 2011-02-28 ENCOUNTER — Telehealth: Payer: Self-pay | Admitting: Family Medicine

## 2011-02-28 ENCOUNTER — Ambulatory Visit (INDEPENDENT_AMBULATORY_CARE_PROVIDER_SITE_OTHER): Payer: Medicare Other | Admitting: Family Medicine

## 2011-02-28 ENCOUNTER — Encounter: Payer: Self-pay | Admitting: Family Medicine

## 2011-02-28 DIAGNOSIS — Z711 Person with feared health complaint in whom no diagnosis is made: Secondary | ICD-10-CM | POA: Diagnosis not present

## 2011-02-28 NOTE — Progress Notes (Addendum)
  Subjective:    Patient ID: Linda Thompson, female    DOB: Nov 20, 1955, 56 y.o.   MRN: [id number]  HPI CC: ?uri  Very intermittent cough.  Throat is not sore.  Has been gargling with salt water - did this once and phlegm came up.  Sometimes having intermittent throat clearing as well.  This occurring off and on.  Comes in today because wants to have tonsils checked because feel swollen.    Sees dentist every 6 mo.  Using mouthwash and spray provided there.  No fevers/chills, abd pain, dysphagia, GERD sxs, headache.  Review of Systems Per HPI    Objective:   Physical Exam  Nursing note and vitals reviewed. Constitutional: She appears well-developed and well-nourished. No distress.  HENT:  Head: Atraumatic. Macrocephalic.  Right Ear: Hearing, tympanic membrane, external ear and ear canal normal.  Left Ear: Hearing, tympanic membrane, external ear and ear canal normal.  Nose: Nose normal. No mucosal edema or rhinorrhea. Right sinus exhibits no maxillary sinus tenderness and no frontal sinus tenderness. Left sinus exhibits no maxillary sinus tenderness and no frontal sinus tenderness.  Mouth/Throat: Uvula is midline, oropharynx is clear and moist and mucous membranes are normal. No oropharyngeal exudate, posterior oropharyngeal edema, posterior oropharyngeal erythema or tonsillar abscesses.       Using tongue depressor, evaluated tonsils - not erythematous, edematous or exudative.  Eyes: Conjunctivae and EOM are normal. Pupils are equal, round, and reactive to light. No scleral icterus.  Neck: Normal range of motion. Neck supple.  Cardiovascular: Normal rate, regular rhythm, normal heart sounds and intact distal pulses.   No murmur heard. Pulmonary/Chest: Effort normal and breath sounds normal. No respiratory distress. She has no wheezes. She has no rales.  Lymphadenopathy:    She has no cervical adenopathy.  Skin: Skin is warm and dry. No rash noted.      Assessment & Plan:

## 2011-02-28 NOTE — Telephone Encounter (Signed)
Pt already scheduled with Dr Sharen Hones today at 3pm.

## 2011-02-28 NOTE — Telephone Encounter (Signed)
Triage Record Num: [id number] Operator: Hillary Bow Patient Name: Linda Thompson Call Date & Time: 02/27/2011 6:26:00PM Patient Phone: [phone] PCP: Audrie Gallus. Tower Patient Gender: Female PCP Fax : Patient DOB: 26-Oct-1955 Practice Name: [facility] Reason for Call: Caller: [name]/Patient; PCP: [name]; CB#: [phone]; Call regarding Sore Throat, onet 2-6. Warm salt garggling not improving symptoms. Afebrile. All emergent sxs r/o per Sore Throat Protocol. Pt would like to be seen on 2-8. Attempted to schedule appt, [facility] would not allow me to take 4pm slot w/ Dr Milinda Antis, error msg received regarding booking that slot which was listed as "same day".. Advised Pt to f/u w/ Office for appt. Protocol(s) Used: Sore Throat or Hoarseness Recommended Outcome per Protocol: Provide Home/Self Care Reason for Outcome: Sore throat AND no other symptoms Care Advice: ~ 02/27/2011 6:44:26PM Page 1 of 1 [email or web address]

## 2011-02-28 NOTE — Patient Instructions (Signed)
Things are looking well today. Tonsils not enlarged. Continue nasal saline irrigation throughout the day if it seems to be helping you, may continue salt water gargles as needed.

## 2011-02-28 NOTE — Assessment & Plan Note (Signed)
rec nasal saline for nasal congestion. Tonsils not enlarged today.

## 2011-03-04 ENCOUNTER — Ambulatory Visit (INDEPENDENT_AMBULATORY_CARE_PROVIDER_SITE_OTHER): Payer: Medicare Other | Admitting: Family Medicine

## 2011-03-04 ENCOUNTER — Encounter: Payer: Self-pay | Admitting: Family Medicine

## 2011-03-04 VITALS — BP 128/84 | HR 92 | Temp 97.7°F | Ht 62.0 in | Wt 198.2 lb

## 2011-03-04 DIAGNOSIS — B369 Superficial mycosis, unspecified: Secondary | ICD-10-CM | POA: Diagnosis not present

## 2011-03-04 DIAGNOSIS — K589 Irritable bowel syndrome without diarrhea: Secondary | ICD-10-CM | POA: Diagnosis not present

## 2011-03-04 NOTE — Progress Notes (Signed)
Subjective:    Patient ID: Linda Thompson, female    DOB: 11-30-1955, 56 y.o.   MRN: [id number]  HPI Here for rash in L groin   Did see dermatologist and has f/u in 3 weeks  Went for her first visit  Gave her zpack - azithro, and also desonide cream -- bid  Did ? A culture -- ? Dr Dareen Piano  Was not told what diagnosis   Rash is overall improved - but on L side - is more painful as of yesterday -- also some itching   Some intermittent crampy pain over L groin/lower abd  Had a bit of diarrhea - from the antibiotic -- just finished  Took some pepto bismol  BM about 5 times per day  Now slowing down  No blood in stool  Patient Active Problem List  Diagnoses  . FUNGAL DERMATITIS  . MENINGIOMA  . DIABETES MELLITUS, TYPE II  . HYPERLIPIDEMIA  . MENTAL RETARDATION, MODERATE  . GLAUCOMA NOS  . HYPERTENSION  . ALLERGIC  RHINITIS  . GERD  . HIATAL HERNIA  . DERMATITIS, SEBORRHEIC NOS  . PRURITUS ANI  . OSTEOARTHRITIS, FOOT  . HEART MURMUR  . TRANSAMINASES, SERUM, ELEVATED  . Personal history of other musculoskeletal disorders  . IRRITABLE BOWEL SYNDROME  . ANKLE PAIN, LEFT  . Hypokalemia  . Back pain  . Candidal intertrigo  . Fecal incontinence  . Worried well   Past Medical History  Diagnosis Date  . Allergic rhinitis, cause unspecified   . Pain in joint, ankle and foot   . Backache, unspecified     chronic LBP with radiculopathy  . Seborrheic dermatitis, unspecified   . Type II or unspecified type diabetes mellitus without mention of complication, not stated as uncontrolled   . History of fracture of arm     Right  . History of fracture of foot   . Dermatomycosis, unspecified   . Esophageal reflux   . Unspecified glaucoma   . Undiagnosed cardiac murmurs   . Diaphragmatic hernia without mention of obstruction or gangrene   . Other and unspecified hyperlipidemia   . Unspecified essential hypertension   . Irritable bowel syndrome   . Benign neoplasm of cerebral  meninges   . Moderate intellectual disabilities   . Nausea alone   . Osteoarthrosis, unspecified whether generalized or localized, ankle and foot   . Pruritus ani   . Unspecified tinnitus   . Nonspecific elevation of levels of transaminase or lactic acid dehydrogenase (LDH)   . Leukorrhea, not specified as infective   . Obesity   . Hyperglycemia   . DDD (degenerative disc disease), lumbar    Past Surgical History  Procedure Date  . Total abdominal hysterectomy   . Cataract extraction    History  Substance Use Topics  . Smoking status: Never Smoker   . Smokeless tobacco: Not on file  . Alcohol Use: No   Family History  Problem Relation Age of Onset  . Diabetes Mother   . Diabetes      Aunt  . Stroke      Aunt (had pacemaker)   Allergies  Allergen Reactions  . Ace Inhibitors     REACTION: COUGH  . Augmentin   . Cyclobenzaprine Hcl     REACTION: diarrhea  . Flonase (Fluticasone Propionate)     nosebleed  . Ibuprofen     REACTION: vomiting  . WUJ:[id number]+QIONGEXBMW Acid+Aspartame     REACTION: diarrhea   Current Outpatient Prescriptions  on File Prior to Visit  Medication Sig Dispense Refill  . aspirin 81 MG tablet Take 81 mg by mouth daily.        Marland Kitchen azithromycin (ZITHROMAX) 250 MG tablet Take 250 mg by mouth daily.      . Calcium Carbonate-Vitamin D (CALCIUM-VITAMIN D) 500-200 MG-UNIT per tablet Take 1 tablet by mouth 2 (two) times daily with a meal.        . diltiazem (CARDIZEM CD) 240 MG 24 hr capsule Take 1 capsule (240 mg total) by mouth daily.  30 capsule  11  . hydrochlorothiazide (MICROZIDE) 12.5 MG capsule Take 1 capsule (12.5 mg total) by mouth daily.  30 capsule  11  . ketoconazole (NIZORAL) 2 % cream APPLY SMALL AMOUNT TO AFFECTED AREA TWICE DAILY AS NEEDED  30 g  0  . ketoconazole (NIZORAL) 2 % shampoo USE AS DIRECTED TWICE WEEKLY PRN  120 mL  3  . KLOR-CON M20 20 MEQ tablet TAKE 1 TABLET EVERY DAY  30 tablet  3  . loratadine (CLARITIN) 10  MG tablet Take 1 tablet (10 mg total) by mouth daily.  7 tablet  0  . metFORMIN (GLUCOPHAGE) 500 MG tablet Take 1 tablet (500 mg total) by mouth 2 (two) times daily with a meal.  30 tablet  11  . methylcellulose oral powder Take by mouth daily.        Marland Kitchen nystatin (MYCOSTATIN) powder Apply to affected area 3 times daily  15 g  0  . omeprazole (PRILOSEC) 20 MG capsule Take 1 capsule (20 mg total) by mouth daily.  30 capsule  11  . potassium chloride SA (KLOR-CON M20) 20 MEQ tablet Take 1 tablet (20 mEq total) by mouth daily.  30 tablet  11  . pravastatin (PRAVACHOL) 40 MG tablet Take 1 tablet (40 mg total) by mouth every evening.  30 tablet  11  . PROBIOTIC CAPS Take 1 capsule by mouth daily.        . capsicum (ZOSTRIX) 0.075 % topical cream Apply topically 3 (three) times daily. Apply -rub on affected area for 5-7 days  28.3 g  0  . cyclobenzaprine (FLEXERIL) 10 MG tablet Take 10 mg by mouth 2 (two) times daily as needed.      . fluconazole (DIFLUCAN) 150 MG tablet Take one pill by mouth once weekly for 4 weeks  4 tablet  0  . nystatin-triamcinolone ointment (MYCOLOG) Apply topically 2 (two) times daily.  30 g  0  . terconazole (TERAZOL 3) 0.8 % vaginal cream Apply to affected areas once daily    20 g  0        Review of Systems Review of Systems  Constitutional: Negative for fever, appetite change, fatigue and unexpected weight change.  Eyes: Negative for pain and visual disturbance.  Respiratory: Negative for cough and shortness of breath.   Cardiovascular: Negative for cp or palpitations    Gastrointestinal: Negative for nausea, diarrhea and constipation. pos for lower abd pain/ cramping  Genitourinary: Negative for urgency and frequency.  Skin: Negative for pallor and pos for rash/ itching  Neurological: Negative for weakness, light-headedness, numbness and headaches.  Hematological: Negative for adenopathy. Does not bruise/bleed easily.  Psychiatric/Behavioral: Negative for dysphoric  mood. The patient is not nervous/anxious.          Objective:   Physical Exam  Constitutional: She appears well-developed and well-nourished. No distress.       overwt and well appearing   HENT:  Head:  Normocephalic and atraumatic.  Mouth/Throat: Oropharynx is clear and moist.  Eyes: Conjunctivae and EOM are normal. Pupils are equal, round, and reactive to light. No scleral icterus.  Neck: Normal range of motion. Neck supple. No JVD present. No thyromegaly present.  Cardiovascular: Normal rate, regular rhythm, normal heart sounds and intact distal pulses.   Pulmonary/Chest: Effort normal and breath sounds normal. No respiratory distress. She has no wheezes.  Abdominal: Soft. Bowel sounds are normal. She exhibits no distension and no mass. There is tenderness. There is no rebound and no guarding.       Very slight lower abd tenderness - moreso on L - to deep palp only without rebound or gaurding  Musculoskeletal: Normal range of motion. She exhibits no edema.  Lymphadenopathy:    She has no cervical adenopathy.  Neurological: She is alert. She has normal reflexes.  Skin: Skin is warm and dry. Rash noted. There is erythema. No pallor.       Intertrigo rash in groin area and under breasts is much improved- scant redness/ no satellite lesions- no excoriations Worst area is L groin   Psychiatric:       Baseline MR - frequently repeats herself In good spirits           Assessment & Plan:

## 2011-03-04 NOTE — Assessment & Plan Note (Signed)
This is much improved on exam -- still using mycostatin powder and now finishing zpack for infx, and also steroid cream  Adv to f/u with derm  Will update both of [place] if symptoms worsen

## 2011-03-04 NOTE — Assessment & Plan Note (Signed)
Intermittent constipation/ diarrhea-- now worse diarrhea with the zpak Since she just finished it - expect improvement I suspect some of her pain in L low abd (which is better today)- may be from this / also less so her dermatitis Adv her to keep doing the same- metamucil (can use pepto occ as needed) If worse/ fever or no imp will update me

## 2011-03-04 NOTE — Patient Instructions (Signed)
Rash is much improved- I am glad,  follow up with dermatology as planned I think the diarrhea is from your irritable bowel syndrome (worsened by the antibiotics-- azithromycin) - and this may cause the cramping in your abdomen  If not improved this week - let me know - or if fever or other symptoms let me know

## 2011-03-11 DIAGNOSIS — L538 Other specified erythematous conditions: Secondary | ICD-10-CM | POA: Diagnosis not present

## 2011-03-12 ENCOUNTER — Telehealth: Payer: Self-pay

## 2011-03-12 NOTE — Telephone Encounter (Signed)
Patient notified as instructed by telephone. 

## 2011-03-12 NOTE — Telephone Encounter (Signed)
Pt is going on church Linda Thompson with church group to [employer] for an all day outing. Pt said she is having regular normal BMs 2-3 times a day. Pt wants to know if can take Loperamide HCL antidiarrheal med OTC tomorrow so she will not have as many BMs since she will be traveling. Pt can be reached at [phone].

## 2011-03-12 NOTE — Telephone Encounter (Signed)
Yes, that is fine. 

## 2011-03-17 ENCOUNTER — Ambulatory Visit: Payer: Medicare Other | Admitting: Family Medicine

## 2011-03-19 ENCOUNTER — Telehealth: Payer: Self-pay | Admitting: Family Medicine

## 2011-03-19 ENCOUNTER — Other Ambulatory Visit: Payer: Self-pay | Admitting: Family Medicine

## 2011-03-19 ENCOUNTER — Ambulatory Visit: Payer: Medicare Other | Admitting: Family Medicine

## 2011-03-19 ENCOUNTER — Encounter: Payer: Self-pay | Admitting: Family Medicine

## 2011-03-19 ENCOUNTER — Ambulatory Visit (INDEPENDENT_AMBULATORY_CARE_PROVIDER_SITE_OTHER): Payer: Medicare Other | Admitting: Family Medicine

## 2011-03-19 VITALS — BP 116/86 | HR 84 | Temp 97.6°F | Ht 62.0 in | Wt 196.5 lb

## 2011-03-19 DIAGNOSIS — R140 Abdominal distension (gaseous): Secondary | ICD-10-CM | POA: Insufficient documentation

## 2011-03-19 DIAGNOSIS — R141 Gas pain: Secondary | ICD-10-CM

## 2011-03-19 DIAGNOSIS — R142 Eructation: Secondary | ICD-10-CM | POA: Diagnosis not present

## 2011-03-19 DIAGNOSIS — K589 Irritable bowel syndrome without diarrhea: Secondary | ICD-10-CM

## 2011-03-19 DIAGNOSIS — R143 Flatulence: Secondary | ICD-10-CM | POA: Diagnosis not present

## 2011-03-19 NOTE — Telephone Encounter (Signed)
Referred Linda Thompson to [facility] per Dr. Milinda Antis. Called [facility], left a message, also faxed information

## 2011-03-19 NOTE — Patient Instructions (Signed)
For gas and burping problems - get some "gas-ex" pills over the counter and take it as directed on the package Also eat more slowly , and avoid carbonated beverages (soda)  Also avoid excessive amounts of beans or broccoli or brussel sprouts - as they can cause excess gas  Update if not starting to improve in a week or if worsening

## 2011-03-19 NOTE — Assessment & Plan Note (Signed)
This seems stable - although excess gas discussed today and ways to combat it

## 2011-03-19 NOTE — Assessment & Plan Note (Signed)
Worse lately- perhaps over nervousness re: appts upcoming and getting where she needs to go Seems anx today Disc symptomatic care - see instructions on AVS  rec trial of gas x and update Disc diet Update if not starting to improve in a week or if worsening

## 2011-03-19 NOTE — Telephone Encounter (Signed)
Thanks a lot 

## 2011-03-19 NOTE — Progress Notes (Signed)
Subjective:    Patient ID: Linda Thompson, female    DOB: 23-Jan-1955, 56 y.o.   MRN: [id number]  HPI Here with "stomach acting up "  ? If it is from something she ate Bowels are moving fine   Is having a lot of gas and burping No pain  No burning  No heartburn - or regurgitation  Is off and on  No nausea or vomiting  Has not been eating differently   Eats vegetables as usual Some pasta No fried foods Drinks just water  This has been going on about a month   Incidentally did f/u with her dermatologist - given emollient cream/ wound gel and another cream she has not started yet  Is getting better - quite a bit   Patient Active Problem List  Diagnoses  . FUNGAL DERMATITIS  . MENINGIOMA  . DIABETES MELLITUS, TYPE II  . HYPERLIPIDEMIA  . MENTAL RETARDATION, MODERATE  . GLAUCOMA NOS  . HYPERTENSION  . ALLERGIC  RHINITIS  . GERD  . HIATAL HERNIA  . DERMATITIS, SEBORRHEIC NOS  . PRURITUS ANI  . OSTEOARTHRITIS, FOOT  . HEART MURMUR  . TRANSAMINASES, SERUM, ELEVATED  . Personal history of other musculoskeletal disorders  . IRRITABLE BOWEL SYNDROME  . ANKLE PAIN, LEFT  . Hypokalemia  . Back pain  . Candidal intertrigo  . Fecal incontinence  . Worried well  . Flatulence/gas pain/belching   Past Medical History  Diagnosis Date  . Allergic rhinitis, cause unspecified   . Pain in joint, ankle and foot   . Backache, unspecified     chronic LBP with radiculopathy  . Seborrheic dermatitis, unspecified   . Type II or unspecified type diabetes mellitus without mention of complication, not stated as uncontrolled   . History of fracture of arm     Right  . History of fracture of foot   . Dermatomycosis, unspecified   . Esophageal reflux   . Unspecified glaucoma   . Undiagnosed cardiac murmurs   . Diaphragmatic hernia without mention of obstruction or gangrene   . Other and unspecified hyperlipidemia   . Unspecified essential hypertension   . Irritable bowel syndrome    . Benign neoplasm of cerebral meninges   . Moderate intellectual disabilities   . Nausea alone   . Osteoarthrosis, unspecified whether generalized or localized, ankle and foot   . Pruritus ani   . Unspecified tinnitus   . Nonspecific elevation of levels of transaminase or lactic acid dehydrogenase (LDH)   . Leukorrhea, not specified as infective   . Obesity   . Hyperglycemia   . DDD (degenerative disc disease), lumbar    Past Surgical History  Procedure Date  . Total abdominal hysterectomy   . Cataract extraction    History  Substance Use Topics  . Smoking status: Never Smoker   . Smokeless tobacco: Not on file  . Alcohol Use: No   Family History  Problem Relation Age of Onset  . Diabetes Mother   . Diabetes      Aunt  . Stroke      Aunt (had pacemaker)   Allergies  Allergen Reactions  . Ace Inhibitors     REACTION: COUGH  . Augmentin   . Cyclobenzaprine Hcl     REACTION: diarrhea  . Flonase (Fluticasone Propionate)     nosebleed  . Ibuprofen     REACTION: vomiting  . WUJ:[id number]+AOZHYQMVH+QIONGEXBMW Acid+Aspartame     REACTION: diarrhea   Current Outpatient Prescriptions on File  Prior to Visit  Medication Sig Dispense Refill  . aspirin 81 MG tablet Take 81 mg by mouth daily.        Marland Kitchen azithromycin (ZITHROMAX) 250 MG tablet Take 250 mg by mouth daily.      . Calcium Carbonate-Vitamin D (CALCIUM-VITAMIN D) 500-200 MG-UNIT per tablet Take 1 tablet by mouth 2 (two) times daily with a meal.        . capsicum (ZOSTRIX) 0.075 % topical cream Apply topically 3 (three) times daily. Apply -rub on affected area for 5-7 days  28.3 g  0  . cyclobenzaprine (FLEXERIL) 10 MG tablet Take 10 mg by mouth 2 (two) times daily as needed.      Marland Kitchen Desonide Crea-Moisturizing Lot 0.05 % KIT Apply topically daily.      Marland Kitchen diltiazem (CARDIZEM CD) 240 MG 24 hr capsule Take 1 capsule (240 mg total) by mouth daily.  30 capsule  11  . fluconazole (DIFLUCAN) 150 MG tablet Take one pill by  mouth once weekly for 4 weeks  4 tablet  0  . hydrochlorothiazide (MICROZIDE) 12.5 MG capsule Take 1 capsule (12.5 mg total) by mouth daily.  30 capsule  11  . ketoconazole (NIZORAL) 2 % cream APPLY SMALL AMOUNT TO AFFECTED AREA TWICE DAILY AS NEEDED  30 g  0  . ketoconazole (NIZORAL) 2 % shampoo USE AS DIRECTED TWICE WEEKLY PRN  120 mL  3  . KLOR-CON M20 20 MEQ tablet TAKE 1 TABLET EVERY DAY  30 tablet  3  . loratadine (CLARITIN) 10 MG tablet Take 1 tablet (10 mg total) by mouth daily.  7 tablet  0  . metFORMIN (GLUCOPHAGE) 500 MG tablet Take 1 tablet (500 mg total) by mouth 2 (two) times daily with a meal.  30 tablet  11  . methylcellulose oral powder Take by mouth daily.        Marland Kitchen nystatin (MYCOSTATIN) powder Apply to affected area 3 times daily  15 g  0  . nystatin-triamcinolone ointment (MYCOLOG) Apply topically 2 (two) times daily.  30 g  0  . omeprazole (PRILOSEC) 20 MG capsule Take 1 capsule (20 mg total) by mouth daily.  30 capsule  11  . potassium chloride SA (KLOR-CON M20) 20 MEQ tablet Take 1 tablet (20 mEq total) by mouth daily.  30 tablet  11  . pravastatin (PRAVACHOL) 40 MG tablet Take 1 tablet (40 mg total) by mouth every evening.  30 tablet  11  . PROBIOTIC CAPS Take 1 capsule by mouth daily.        Marland Kitchen terconazole (TERAZOL 3) 0.8 % vaginal cream Apply to affected areas once daily    20 g  0       Review of Systems Review of Systems  Constitutional: Negative for fever, appetite change, fatigue and unexpected weight change.  Eyes: Negative for pain and visual disturbance.  Respiratory: Negative for cough and shortness of breath.   Cardiovascular: Negative for cp or palpitations    Gastrointestinal: Negative for nausea, diarrhea and constipation (today), pos for gas/ belching/ neg for abd pain/ blood in stool and dark stools  Genitourinary: Negative for urgency and frequency. neg for hematuria  Skin: Negative for pallor or rash   Neurological: Negative for weakness,  light-headedness, numbness and headaches.  Hematological: Negative for adenopathy. Does not bruise/bleed easily.  Psychiatric/Behavioral: Negative for dysphoric mood. The patient is not nervous/anxious.          Objective:   Physical Exam  Constitutional:  She appears well-developed and well-nourished. No distress.  HENT:  Head: Normocephalic and atraumatic.  Mouth/Throat: Oropharynx is clear and moist.  Eyes: Conjunctivae and EOM are normal. Pupils are equal, round, and reactive to light. No scleral icterus.  Neck: Normal range of motion. Neck supple. No JVD present. No thyromegaly present.  Cardiovascular: Normal rate, regular rhythm and normal heart sounds.   Pulmonary/Chest: Breath sounds normal. No respiratory distress. She has no wheezes.  Abdominal: Soft. Bowel sounds are normal. She exhibits no distension, no fluid wave, no abdominal bruit, no ascites and no mass. There is no hepatosplenomegaly. There is no tenderness. There is no rebound and no CVA tenderness.  Musculoskeletal: She exhibits no edema and no tenderness.  Lymphadenopathy:    She has no cervical adenopathy.  Neurological: She is alert.  Skin: Skin is warm and dry. No rash noted. No erythema. No pallor.  Psychiatric: She has a normal mood and affect.       Baseline MR - repeats questions and statements as usual Seems a bit more anxious than usual today          Assessment & Plan:

## 2011-03-20 DIAGNOSIS — H04129 Dry eye syndrome of unspecified lacrimal gland: Secondary | ICD-10-CM | POA: Diagnosis not present

## 2011-03-20 DIAGNOSIS — H1045 Other chronic allergic conjunctivitis: Secondary | ICD-10-CM | POA: Diagnosis not present

## 2011-03-20 DIAGNOSIS — H01139 Eczematous dermatitis of unspecified eye, unspecified eyelid: Secondary | ICD-10-CM | POA: Diagnosis not present

## 2011-03-25 ENCOUNTER — Ambulatory Visit (INDEPENDENT_AMBULATORY_CARE_PROVIDER_SITE_OTHER): Payer: Medicare Other | Admitting: Family Medicine

## 2011-03-25 ENCOUNTER — Encounter: Payer: Self-pay | Admitting: Family Medicine

## 2011-03-25 VITALS — BP 124/82 | HR 86 | Temp 97.8°F | Wt 196.8 lb

## 2011-03-25 DIAGNOSIS — R140 Abdominal distension (gaseous): Secondary | ICD-10-CM

## 2011-03-25 DIAGNOSIS — R141 Gas pain: Secondary | ICD-10-CM | POA: Diagnosis not present

## 2011-03-25 DIAGNOSIS — K589 Irritable bowel syndrome without diarrhea: Secondary | ICD-10-CM

## 2011-03-25 DIAGNOSIS — R142 Eructation: Secondary | ICD-10-CM

## 2011-03-25 MED ORDER — OMEPRAZOLE 40 MG PO CPDR: 40.0000 mg | DELAYED_RELEASE_CAPSULE | Freq: Every day | ORAL | Status: DC

## 2011-03-25 NOTE — Assessment & Plan Note (Signed)
Bowels are moving better - but sounds like she has had some cramping lately  See assessment above Disc diet  F/u planned 4-6 wk with update earlier if worse

## 2011-03-25 NOTE — Progress Notes (Signed)
Subjective:    Patient ID: Linda Thompson, female    DOB: [birth year], 56 y.o.   MRN: [id number]  HPI Is back here for her stomach again  Went to the pharmacist and was told that perhaps she needed something stronger than omeprazole  No spicy food Minimal caffeine Drinks water  Also does not eat a lot at night   Earlier today stomach was hurting bad in grocery store  Out of the blue today  Pain is around the belly button -- is really bad -- hit her all at once  Just a little burning  Lasted just a few minutes  When she got home --had a good bm and used baby wipe (a little skin burning)  No nsaids except for one 81 mg asa daily   Bowels are moving well now without problems  Usually has constipation or diarrhea  occ cramps before bm No blood in stool or dark stool  Last visit was for burping and belching For the most part that is better than it was - with change in diet   Patient Active Problem List  Diagnoses  . FUNGAL DERMATITIS  . MENINGIOMA  . DIABETES MELLITUS, TYPE II  . HYPERLIPIDEMIA  . MENTAL RETARDATION, MODERATE  . GLAUCOMA NOS  . HYPERTENSION  . ALLERGIC  RHINITIS  . GERD  . HIATAL HERNIA  . DERMATITIS, SEBORRHEIC NOS  . PRURITUS ANI  . OSTEOARTHRITIS, FOOT  . HEART MURMUR  . TRANSAMINASES, SERUM, ELEVATED  . Personal history of other musculoskeletal disorders  . IRRITABLE BOWEL SYNDROME  . ANKLE PAIN, LEFT  . Hypokalemia  . Back pain  . Candidal intertrigo  . Fecal incontinence  . Worried well  . Flatulence/gas pain/belching   Past Medical History  Diagnosis Date  . Allergic rhinitis, cause unspecified   . Pain in joint, ankle and foot   . Backache, unspecified     chronic LBP with radiculopathy  . Seborrheic dermatitis, unspecified   . Type II or unspecified type diabetes mellitus without mention of complication, not stated as uncontrolled   . History of fracture of arm     Right  . History of fracture of foot   . Dermatomycosis,  unspecified   . Esophageal reflux   . Unspecified glaucoma   . Undiagnosed cardiac murmurs   . Diaphragmatic hernia without mention of obstruction or gangrene   . Other and unspecified hyperlipidemia   . Unspecified essential hypertension   . Irritable bowel syndrome   . Benign neoplasm of cerebral meninges   . Moderate intellectual disabilities   . Nausea alone   . Osteoarthrosis, unspecified whether generalized or localized, ankle and foot   . Pruritus ani   . Unspecified tinnitus   . Nonspecific elevation of levels of transaminase or lactic acid dehydrogenase (LDH)   . Leukorrhea, not specified as infective   . Obesity   . Hyperglycemia   . DDD (degenerative disc disease), lumbar    Past Surgical History  Procedure Date  . Total abdominal hysterectomy   . Cataract extraction    History  Substance Use Topics  . Smoking status: Never Smoker   . Smokeless tobacco: Not on file  . Alcohol Use: No   Family History  Problem Relation Age of Onset  . Diabetes Mother   . Diabetes      Aunt  . Stroke      Aunt (had pacemaker)   Allergies  Allergen Reactions  . Ace Inhibitors  REACTION: COUGH  . Augmentin   . Cyclobenzaprine Hcl     REACTION: diarrhea  . Flonase (Fluticasone Propionate)     nosebleed  . Ibuprofen     REACTION: vomiting  . BJY:NWGNFAOZHYQ+MVHQIONGE+XBMWUXLKGM Acid+Aspartame     REACTION: diarrhea   Current Outpatient Prescriptions on File Prior to Visit  Medication Sig Dispense Refill  . aspirin 81 MG tablet Take 81 mg by mouth daily.         No current facility-administered medications on file prior to visit.         Review of Systems Review of Systems  Constitutional: Negative for fever, appetite change, fatigue and unexpected weight change.  Eyes: Negative for pain and visual disturbance.  Respiratory: Negative for cough and shortness of breath.   Cardiovascular: Negative for cp or palpitations    Gastrointestinal: Negative for nausea  or vomiting , pos for intermittent abd pain/ cramps/ intermittent diarrhea and constipation , no blood in stool or dark stool Genitourinary: Negative for urgency and frequency.  Skin: Negative for pallor or rash   Neurological: Negative for weakness, light-headedness, numbness and headaches.  Hematological: Negative for adenopathy. Does not bruise/bleed easily.  Psychiatric/Behavioral: Negative for dysphoric mood. The patient is not nervous/anxious.          Objective:   Physical Exam  Constitutional: She appears well-developed and well-nourished. No distress.       Obese and well appearing   HENT:  Head: Normocephalic.  Mouth/Throat: Oropharynx is clear and moist.  Eyes: Conjunctivae and EOM are normal. Pupils are equal, round, and reactive to light. No scleral icterus.  Neck: Normal range of motion. Neck supple. No JVD present. No thyromegaly present.  Cardiovascular: Normal rate, regular rhythm, normal heart sounds and intact distal pulses.  Exam reveals no gallop.   Pulmonary/Chest: Effort normal and breath sounds normal. No respiratory distress. She has no wheezes. She exhibits no tenderness.  Abdominal: Soft. Bowel sounds are normal. She exhibits no distension and no mass. There is no tenderness. There is no rebound and no guarding.  Musculoskeletal: Normal range of motion. She exhibits no edema.  Lymphadenopathy:    She has no cervical adenopathy.  Neurological: She is alert. She has normal reflexes. No cranial nerve deficit. She exhibits normal muscle tone. Coordination normal.  Skin: Skin is warm and dry. No rash noted. No erythema. No pallor.  Psychiatric: She has a normal mood and affect.       Baseline MR Repeats herself frequently  A little anxious today          Assessment & Plan:

## 2011-03-25 NOTE — Patient Instructions (Addendum)
I am increasing your omeprazole (prilosec) from 20 to 40 mg daily  The pills you have left - just take 2 pills once daily instead of one (in the am)  I sent new px to the pharmacy  Follow up with me in 2-4 weeks to see how you are doing

## 2011-03-25 NOTE — Assessment & Plan Note (Signed)
This has improved some but pt is now having more generalized burning/ pain - difficult to assess if acid related or gas/ IBS Will inc omeprazole to 40 to see how she does and f/u 2-4 wk If worse or no imp will ref to GI for a visit

## 2011-03-26 ENCOUNTER — Emergency Department (INDEPENDENT_AMBULATORY_CARE_PROVIDER_SITE_OTHER)
Admission: EM | Admit: 2011-03-26 | Discharge: 2011-03-26 | Disposition: A | Discharge status: Nursing Home (Includes ICF) | Payer: Medicare Other | Source: Home / Self Care | Attending: Family Medicine | Admitting: Family Medicine

## 2011-03-26 ENCOUNTER — Other Ambulatory Visit: Payer: Self-pay

## 2011-03-26 ENCOUNTER — Emergency Department (HOSPITAL_COMMUNITY): Payer: Medicare Other

## 2011-03-26 ENCOUNTER — Encounter (HOSPITAL_COMMUNITY): Payer: Self-pay | Admitting: *Deleted

## 2011-03-26 ENCOUNTER — Observation Stay (HOSPITAL_COMMUNITY)
Admission: EM | Admit: 2011-03-26 | Discharge: 2011-03-27 | Disposition: A | Discharge status: Home Health | Payer: Medicare Other | Attending: Emergency Medicine | Admitting: Emergency Medicine

## 2011-03-26 ENCOUNTER — Encounter (HOSPITAL_COMMUNITY): Payer: Self-pay | Admitting: Emergency Medicine

## 2011-03-26 DIAGNOSIS — Z8249 Family history of ischemic heart disease and other diseases of the circulatory system: Secondary | ICD-10-CM | POA: Insufficient documentation

## 2011-03-26 DIAGNOSIS — I10 Essential (primary) hypertension: Secondary | ICD-10-CM | POA: Insufficient documentation

## 2011-03-26 DIAGNOSIS — E785 Hyperlipidemia, unspecified: Secondary | ICD-10-CM | POA: Insufficient documentation

## 2011-03-26 DIAGNOSIS — E119 Type 2 diabetes mellitus without complications: Secondary | ICD-10-CM | POA: Insufficient documentation

## 2011-03-26 DIAGNOSIS — R0789 Other chest pain: Secondary | ICD-10-CM

## 2011-03-26 DIAGNOSIS — R079 Chest pain, unspecified: Secondary | ICD-10-CM | POA: Diagnosis not present

## 2011-03-26 LAB — COMPREHENSIVE METABOLIC PANEL
ALT: 33 U/L (ref 0–35)
AST: 22 U/L (ref 0–37)
Alkaline Phosphatase: 127 U/L — ABNORMAL HIGH (ref 39–117)
CO2: 24 mEq/L (ref 19–32)
Chloride: 107 mEq/L (ref 96–112)
Creatinine, Ser: 0.64 mg/dL (ref 0.50–1.10)
GFR calc non Af Amer: >90 mL/min (ref >90)
Total Bilirubin: 0.1 mg/dL — ABNORMAL LOW (ref 0.3–1.2)

## 2011-03-26 LAB — POCT I-STAT TROPONIN I

## 2011-03-26 LAB — CBC
HCT: 39.3 % (ref 36.0–46.0)
Hemoglobin: 12.2 g/dL (ref 12.0–15.0)
RBC: 4.67 MIL/uL (ref 3.87–5.11)
WBC: 8.4 10*3/uL (ref 4.0–10.5)

## 2011-03-26 MED ORDER — ASPIRIN 81 MG PO CHEW
324.0000 mg | CHEWABLE_TABLET | Freq: Once | ORAL | Status: AC
  Administered 2011-03-26: 324 mg via ORAL
  Filled 2011-03-26: qty 4

## 2011-03-26 MED ORDER — NITROGLYCERIN 2 % TD OINT
0.5000 [in_us] | TOPICAL_OINTMENT | Freq: Four times a day (QID) | TRANSDERMAL | Status: DC
  Administered 2011-03-26 – 2011-03-27 (×2): 0.5 [in_us] via TOPICAL
  Filled 2011-03-26 (×2): qty 1

## 2011-03-26 NOTE — ED Provider Notes (Signed)
I saw and evaluated the patient, reviewed the resident's note and I agree with the findings and plan.  Patient describes chest pain which began at rest localized to her left chest not associated with dyspnea, diaphoresis, nausea or vomiting. Symptoms last for seconds to minutes. Not made worse or better by anything. Patient's EKG reviewed in without concern for ACS. First cardiac markers negative. Patient is a candidate for the CT chest pain protocol. Patient's BMI is 34.7. She'll be placed on the chest pain protocol for a cardiac CT morning and overnight serial cardiac enzymes  Toy Baker, MD 03/26/11 2201

## 2011-03-26 NOTE — ED Provider Notes (Signed)
11:07 PM You should just began this evening while sitting. Patient has multiple risk factors for acute coronary syndrome. She has hypertension, hyperlipidemia, diabetes, and early family history. Patient's heart score of 3 stents placed chest pain protocol. Patient does have a coronary CT tomorrow.  Thomasene Lot, PA-C 03/26/11 2320

## 2011-03-26 NOTE — ED Provider Notes (Signed)
History     CSN: [id number]  Arrival date & time 03/26/11  1851   First MD Initiated Contact with Patient 03/26/11 2034      Chief Complaint  Patient presents with  . Chest Pain    (Consider location/radiation/quality/duration/timing/severity/associated sxs/prior treatment) HPI History provided by the patient. History mildly limited secondary to poor historian.  56 year old female with history of hypertension, diabetes, hyperlipidemia, and family history of CAD at an early age presenting with complaint of chest pain. Patient states her symptoms began gradually 4-5 hours ago. Her pain is located in her left lower chest 2 left lateral chest and is without radiation, or associated symptoms. Pain is described as "aching," intermittent, without associated alleviating or exacerbating factors.  Pt's pain began at rest and is not associated with activity/exertion.  Severity was initially moderate but is now gone.  No history of similar symptoms in the past. Patient was seen at urgent care prior to ED arrival with a reportedly abnormal EKG and sent here for further evaluation.   Past Medical History  Diagnosis Date  . Allergic rhinitis, cause unspecified   . Pain in joint, ankle and foot   . Backache, unspecified     chronic LBP with radiculopathy  . Seborrheic dermatitis, unspecified   . Type II or unspecified type diabetes mellitus without mention of complication, not stated as uncontrolled   . History of fracture of arm     Right  . History of fracture of foot   . Dermatomycosis, unspecified   . Esophageal reflux   . Unspecified glaucoma   . Undiagnosed cardiac murmurs   . Diaphragmatic hernia without mention of obstruction or gangrene   . Other and unspecified hyperlipidemia   . Unspecified essential hypertension   . Irritable bowel syndrome   . Benign neoplasm of cerebral meninges   . Moderate intellectual disabilities   . Nausea alone   . Osteoarthrosis, unspecified whether  generalized or localized, ankle and foot   . Pruritus ani   . Unspecified tinnitus   . Nonspecific elevation of levels of transaminase or lactic acid dehydrogenase (LDH)   . Leukorrhea, not specified as infective   . Obesity   . Hyperglycemia   . DDD (degenerative disc disease), lumbar     Past Surgical History  Procedure Date  . Total abdominal hysterectomy   . Cataract extraction     Family History  Problem Relation Age of Onset  . Diabetes Mother   . Diabetes      Aunt  . Stroke      Aunt (had pacemaker)    History  Substance Use Topics  . Smoking status: Never Smoker   . Smokeless tobacco: Not on file  . Alcohol Use: No    OB History    Grav Para Term Preterm Abortions TAB SAB Ect Mult Living                  Review of Systems  Constitutional: Negative for fever and chills.  HENT: Negative for congestion, sore throat and rhinorrhea.   Eyes: Negative for pain and visual disturbance.  Respiratory: Negative for cough, shortness of breath and wheezing.   Cardiovascular: Positive for chest pain. Negative for palpitations.  Gastrointestinal: Negative for nausea, vomiting, abdominal pain, diarrhea and blood in stool.  Genitourinary: Negative for dysuria and hematuria.  Musculoskeletal: Negative for back pain and gait problem.  Skin: Negative for rash and wound.  Neurological: Negative for dizziness and headaches.  Psychiatric/Behavioral: Negative for  confusion and agitation.  All other systems reviewed and are negative.    Allergies  Ace inhibitors; Augmentin; Cyclobenzaprine hcl; Flonase; Ibuprofen; and ZOX:WRUEAVWUJWJ+XBJYNWGNF+AOZHYQMVHQ acid+aspartame  Home Medications   Current Outpatient Rx  Name Route Sig Dispense Refill  . ASPIRIN 81 MG PO TABS Oral Take 81 mg by mouth daily.      Marland Kitchen DILTIAZEM HCL ER COATED BEADS 240 MG PO CP24 Oral Take 240 mg by mouth daily.    Marland Kitchen HYDROCHLOROTHIAZIDE 12.5 MG PO CAPS Oral Take 12.5 mg by mouth daily.    Marland Kitchen  KETOCONAZOLE 2 % EX CREA Topical Apply 1 application topically 2 (two) times daily.    Marland Kitchen KETOCONAZOLE 2 % EX SHAM Topical Apply 1 application topically 2 (two) times a week.    . METFORMIN HCL 500 MG PO TABS Oral Take 500 mg by mouth 2 (two) times daily with a meal.    . NYSTATIN 100000 UNIT/GM EX POWD Topical Apply 1 Units topically 3 (three) times daily.    Marland Kitchen OMEPRAZOLE 40 MG PO CPDR Oral Take 40 mg by mouth daily.    Marland Kitchen POTASSIUM CHLORIDE CRYS ER 20 MEQ PO TBCR Oral Take 20 mEq by mouth daily.    Marland Kitchen PRAVASTATIN SODIUM 40 MG PO TABS Oral Take 40 mg by mouth every evening.      BP 148/106  Pulse 95  Temp 98 F (36.7 C)  Resp 18  SpO2 96%  Physical Exam  Nursing note and vitals reviewed. Constitutional: She is oriented to person, place, and time.       Morbidly obese, alert, strong [place] accent, talkative, NAD  HENT:  Head: Normocephalic and atraumatic.  Right Ear: External ear normal.  Left Ear: External ear normal.  Nose: Nose normal.  Mouth/Throat: Oropharynx is clear and moist.  Eyes: Conjunctivae and EOM are normal. Pupils are equal, round, and reactive to light.  Neck: Normal range of motion. Neck supple.  Cardiovascular: Normal rate, regular rhythm and intact distal pulses.   No murmur heard. Pulmonary/Chest: Effort normal and breath sounds normal. No respiratory distress. She has no wheezes. She has no rales. She exhibits tenderness (localized to Lt anterior axillary line inferior to her Lt breast, but reportedly different from her subjective c/o.).  Abdominal: Soft. Bowel sounds are normal. There is no tenderness.       Obese  Musculoskeletal: Normal range of motion. She exhibits no edema.  Neurological: She is alert and oriented to person, place, and time.  Skin: Skin is warm and dry. No rash noted. She is not diaphoretic.  Psychiatric: She has a normal mood and affect. Judgment normal.    ED Course  Procedures (including critical care time)  Labs Reviewed    COMPREHENSIVE METABOLIC PANEL - Abnormal; Notable for the following:    Glucose, Bld 110 (*)    Albumin 3.4 (*)    Alkaline Phosphatase 127 (*)    Total Bilirubin 0.1 (*)    All other components within normal limits  CBC  POCT I-STAT TROPONIN I  POCT I-STAT TROPONIN I   Dg Chest 2 View  03/26/2011  *RADIOLOGY REPORT*  Clinical Data: Chest pain  CHEST - 2 VIEW  Comparison: 03/28/2018 4:11  Findings: Heart size and vascularity are normal.  Lungs are clear without infiltrate or effusion.  Negative for mass lesion.  IMPRESSION: No active cardiopulmonary disease.  Original Report Authenticated By: Camelia Phenes, M.D.     1. Chest pain      MDM  56 year old  female presenting with intermittent chest pain without associated symptoms.  Largely unremarkable exam. Doubt PE, pneumonia, dissection, or pericarditis. ACS possible, although somewhat atypical presentation. Patient HEART score of 3 the rest factors and age. EKG without acute ischemia and chest x-ray without sign of infection or acute pathology.  Initial troponin negative. Will place the patient in CDU low chest pain obs with plan for CT coronary in the morning, as patient's BMI is borderline 34.7.        Particia Lather, MD 03/27/11 [phone]

## 2011-03-26 NOTE — ED Notes (Addendum)
Patient transfer from Urgent Care.  Patient complaining of chest pain underneath her left breast that started this evening; denies any associated symptoms or radiation of pain.  Patient denies cardiac history.

## 2011-03-26 NOTE — ED Notes (Signed)
L     Side   Chest  Pain   Started  earlier  Today        Pt  Is  Somewhat  Anxious      denys  Any  Shortness of  Breath  Pt is  Somewhat animated        Worried     Because  Of family  History of  Heart  Disease        skinis  Warm /  Dry  Cap  Refill is  Brisk   Lungs  Are  Clear

## 2011-03-26 NOTE — ED Notes (Signed)
EKG done at 1856.

## 2011-03-26 NOTE — ED Provider Notes (Signed)
History     CSN: [id number]  Arrival date & time 03/26/11  1734   First MD Initiated Contact with Patient 03/26/11 1737      Chief Complaint  Patient presents with  . Chest Pain    (Consider location/radiation/quality/duration/timing/severity/associated sxs/prior treatment) Patient is a 56 y.o. female presenting with chest pain. The history is provided by the patient.  Chest Pain The chest pain began 3 - 5 hours ago (onset while sitting). The chest pain is resolved. The pain is currently at 0/10. The severity of the pain is mild. The pain does not radiate. Risk factors: pos fam hx cad.     Past Medical History  Diagnosis Date  . Allergic rhinitis, cause unspecified   . Pain in joint, ankle and foot   . Backache, unspecified     chronic LBP with radiculopathy  . Seborrheic dermatitis, unspecified   . Type II or unspecified type diabetes mellitus without mention of complication, not stated as uncontrolled   . History of fracture of arm     Right  . History of fracture of foot   . Dermatomycosis, unspecified   . Esophageal reflux   . Unspecified glaucoma   . Undiagnosed cardiac murmurs   . Diaphragmatic hernia without mention of obstruction or gangrene   . Other and unspecified hyperlipidemia   . Unspecified essential hypertension   . Irritable bowel syndrome   . Benign neoplasm of cerebral meninges   . Moderate intellectual disabilities   . Nausea alone   . Osteoarthrosis, unspecified whether generalized or localized, ankle and foot   . Pruritus ani   . Unspecified tinnitus   . Nonspecific elevation of levels of transaminase or lactic acid dehydrogenase (LDH)   . Leukorrhea, not specified as infective   . Obesity   . Hyperglycemia   . DDD (degenerative disc disease), lumbar     Past Surgical History  Procedure Date  . Total abdominal hysterectomy   . Cataract extraction     Family History  Problem Relation Age of Onset  . Diabetes Mother   . Diabetes     Aunt  . Stroke      Aunt (had pacemaker)    History  Substance Use Topics  . Smoking status: Never Smoker   . Smokeless tobacco: Not on file  . Alcohol Use: No    OB History    Grav Para Term Preterm Abortions TAB SAB Ect Mult Living                  Review of Systems  Constitutional: Negative.   HENT: Negative.   Respiratory: Negative.   Cardiovascular: Positive for chest pain.  Gastrointestinal: Negative.     Allergies  Ace inhibitors; Augmentin; Cyclobenzaprine hcl; Flonase; Ibuprofen; and WUJ:WJXBJYNWGNF+AOZHYQMVH+QIONGEXBMW acid+aspartame  Home Medications   Current Outpatient Rx  Name Route Sig Dispense Refill  . ASPIRIN 81 MG PO TABS Oral Take 81 mg by mouth daily.      . AZITHROMYCIN 250 MG PO TABS Oral Take 250 mg by mouth daily.    Marland Kitchen CALCIUM-VITAMIN D 500-200 MG-UNIT PO TABS Oral Take 1 tablet by mouth 2 (two) times daily with a meal.      . CAPSAICIN 0.075 % EX CREA Topical Apply topically 3 (three) times daily. Apply -rub on affected area for 5-7 days 28.3 g 0  . CYCLOBENZAPRINE HCL 10 MG PO TABS Oral Take 10 mg by mouth 2 (two) times daily as needed.    . DESONIDE  CREA-MOISTURIZING LOT 0.05 % EX KIT Apply externally Apply topically daily.    Marland Kitchen DILTIAZEM HCL ER COATED BEADS 240 MG PO CP24 Oral Take 1 capsule (240 mg total) by mouth daily. 30 capsule 11  . AURSTAT EX Apply externally Apply topically as directed.    Marland Kitchen FLUCONAZOLE 150 MG PO TABS  Take one pill by mouth once weekly for 4 weeks 4 tablet 0  . HYDROCHLOROTHIAZIDE 12.5 MG PO CAPS Oral Take 1 capsule (12.5 mg total) by mouth daily. 30 capsule 11  . KETOCONAZOLE 2 % EX CREA  APPLY SMALL AMOUNT TO AFFECTED AREA TWICE DAILY AS NEEDED 30 g 0  . KETOCONAZOLE 2 % EX SHAM  USE AS DIRECTED TWICE WEEKLY PRN 120 mL 3  . KLOR-CON M20 20 MEQ PO TBCR  TAKE 1 TABLET EVERY DAY 30 tablet 3  . LORATADINE 10 MG PO TABS Oral Take 1 tablet (10 mg total) by mouth daily. 7 tablet 0  . METFORMIN HCL 500 MG PO TABS Oral  Take 1 tablet (500 mg total) by mouth 2 (two) times daily with a meal. 30 tablet 11  . METHYLCELLULOSE (LAXATIVE) PO POWD Oral Take by mouth daily.      . NYSTATIN 100000 UNIT/GM EX POWD  Apply to affected area 3 times daily 15 g 0  . NYSTATIN-TRIAMCINOLONE 100000-0.1 UNIT/GM-% EX OINT Topical Apply topically 2 (two) times daily. 30 g 0  . OMEPRAZOLE 40 MG PO CPDR Oral Take 1 capsule (40 mg total) by mouth daily. 30 capsule 11  . POTASSIUM CHLORIDE CRYS ER 20 MEQ PO TBCR Oral Take 1 tablet (20 mEq total) by mouth daily. 30 tablet 11  . PRAVASTATIN SODIUM 40 MG PO TABS Oral Take 1 tablet (40 mg total) by mouth every evening. 30 tablet 11  . PROBIOTIC PO CAPS Oral Take 1 capsule by mouth daily.      . TERCONAZOLE 0.8 % VA CREA  Apply to affected areas once daily   20 g 0    BP 144/56  Pulse 96  Temp(Src) 98 F (36.7 C) (Oral)  Resp 18  SpO2 98%  Physical Exam  Nursing note and vitals reviewed. Constitutional: She appears well-developed and well-nourished.  Neck: Normal range of motion. Neck supple.  Pulmonary/Chest: Effort normal and breath sounds normal.  Abdominal: Bowel sounds are normal.  Skin: Skin is warm and dry.    ED Course  Procedures (including critical care time)  Labs Reviewed - No data to display No results found.   1. Atypical chest pain       MDM  ecg--abnl.        Barkley Bruns, MD 03/26/11 [phone]

## 2011-03-26 NOTE — ED Notes (Signed)
Pt  denys  Any  Chest pain  At   This  Time

## 2011-03-27 ENCOUNTER — Other Ambulatory Visit: Payer: Self-pay

## 2011-03-27 ENCOUNTER — Ambulatory Visit: Payer: Medicare Other | Admitting: Family Medicine

## 2011-03-27 DIAGNOSIS — R079 Chest pain, unspecified: Secondary | ICD-10-CM

## 2011-03-27 LAB — POCT I-STAT TROPONIN I

## 2011-03-27 MED ORDER — METOPROLOL TARTRATE 25 MG PO TABS: 50.0000 mg | ORAL_TABLET | Freq: Once | ORAL | Status: DC

## 2011-03-27 MED ORDER — METOPROLOL TARTRATE 25 MG PO TABS: 100.0000 mg | ORAL_TABLET | Freq: Once | ORAL | Status: DC

## 2011-03-27 NOTE — Discharge Instructions (Signed)
Call Dr. Royden Purl office today to schedule close follow up to address ER visit for chest pain and to further management your blood pressure, cholesterol and diabetes but return to ER for emergent changing or worsening of symptoms.   Chest Pain (Nonspecific) Chest pain has many causes. Your pain could be caused by something serious, such as a heart attack or a blood clot in the lungs. It could also be caused by something less serious, such as a chest bruise or a virus. Follow up with your doctor. More lab tests or other studies may be needed to find the cause of your pain. Most of the time, nonspecific chest pain will improve within 2 to 3 days of rest and mild pain medicine. HOME CARE  For chest bruises, you may put ice on the sore area for 15 to 20 minutes, 3 to 4 times a day. Do this only if it makes you or your child feel better.   Put ice in a plastic bag.   Place a towel between the skin and the bag.   Rest for the next 2 to 3 days.   Go back to work if the pain improves.   See your doctor if the pain lasts longer than 1 to 2 weeks.   Only take medicine as told by your doctor.   Quit smoking if you smoke.  GET HELP RIGHT AWAY IF:   There is more pain or pain that spreads to the arm, neck, jaw, back, or belly (abdomen).   You or your child has shortness of breath.   You or your child coughs more than usual or coughs up blood.   You or your child has very bad back or belly pain, feels sick to his or her stomach (nauseous), or throws up (vomits).   You or your child has very bad weakness.   You or your child passes out (faints).   You or your child has a temperature by mouth above 102 F (38.9 C), not controlled by medicine.  Any of these problems may be serious and may be an emergency. Do not wait to see if the problems will go away. Get medical help right away. Call your local emergency services 911 in [place] Do not drive yourself to the hospital. MAKE SURE YOU:   Understand  these instructions.   Will watch this condition.   Will get help right away if you or your child is not doing well or gets worse.  Document Released: 06/25/2007 Document Revised: 12/26/2010 Document Reviewed: 06/25/2007 [facility] Patient Information 2012 [place], [place].

## 2011-03-27 NOTE — Progress Notes (Signed)
*  PRELIMINARY RESULTS* Echocardiogram Echocardiogram Stress Test has been performed.  Linda Thompson [facility] 03/27/2011, 11:22 AM

## 2011-03-27 NOTE — ED Notes (Signed)
bmi  Is 35.3

## 2011-03-27 NOTE — Progress Notes (Signed)
Observation review is complete. 

## 2011-03-27 NOTE — ED Notes (Signed)
Pt education provided on stress test. Questions answered. Pt with some learning delay issues. But verbalizes understanding.

## 2011-03-27 NOTE — ED Provider Notes (Addendum)
Patient is resting comfortably in bed pending stress ECHO. VSS. No complaints. Serial troponins have been negative.   Lenon Oms [place], [place] 03/27/11 [phone]  Dr. Jearld Pies states poor exercise tolerance but no ischemic changes and normal EKG. Patient to follow up with PCP   Jenness Corner, PA 03/27/11 1135

## 2011-03-27 NOTE — ED Provider Notes (Signed)
Patient now states that she is 5'2" this puts her BMI to high for the CT angio. She'll now get a stress test  Juliet Rude. Rubin Payor, MD 03/27/11 [phone]

## 2011-03-27 NOTE — ED Provider Notes (Signed)
I was available for the completion of this patient's ED CDU evaluation.  Please see the initial ED note for Attending MD evaluation.  Demara Lover, MD 03/27/11 1500 

## 2011-03-27 NOTE — Progress Notes (Signed)
ER GXT Echo 03/27/2011  Pre-test: no CP/SOB, ECG no acute ischemic changes, ez negative for MI. Initial BP 170/113 but recheck 162/107, so able to proceed with carefull monitoring.   Pt described significant back/leg problems that limit her ability to walk so manual protocol used for stressing.  With stress test, pt had no CP, slight SOB and some fatigue. POOR exercise tolerance. She was unable to walk much more than 1.0 MPH but reached target HR at 1.1 MPH and 18% grade. Exercise terminated after target reached. ECG without ischemic changes.   Images pending.  Bjorn Loser Lahari Suttles 03/27/2011 10:40 AM

## 2011-03-27 NOTE — ED Provider Notes (Signed)
I was available for the completion of this patient's ED CDU evaluation.  Please see the initial ED note for Attending MD evaluation.  Lathon Adan, MD 03/27/11 0937 

## 2011-03-28 DIAGNOSIS — L608 Other nail disorders: Secondary | ICD-10-CM | POA: Diagnosis not present

## 2011-03-28 DIAGNOSIS — E1149 Type 2 diabetes mellitus with other diabetic neurological complication: Secondary | ICD-10-CM | POA: Diagnosis not present

## 2011-03-28 NOTE — ED Provider Notes (Signed)
Medical screening examination/treatment/procedure(s) were conducted as a shared visit with non-physician practitioner(s) and myself.  I personally evaluated the patient during the encounter  Toy Baker, MD 03/28/11 [phone]

## 2011-03-28 NOTE — ED Provider Notes (Signed)
I saw and evaluated the patient, reviewed the resident's note and I agree with the findings and plan.  Toy Baker, MD 03/28/11 [phone]

## 2011-03-31 ENCOUNTER — Ambulatory Visit: Payer: Medicare Other | Admitting: Family Medicine

## 2011-04-02 ENCOUNTER — Telehealth: Payer: Self-pay | Admitting: Family Medicine

## 2011-04-02 NOTE — Telephone Encounter (Signed)
Agree with recommendations for uri

## 2011-04-02 NOTE — Telephone Encounter (Signed)
Triage Record Num: [id number] Operator: Gypsy Decant Patient Name: Linda Thompson Call Date & Time: 04/02/2011 1:49:38AM Patient Phone: [phone] PCP: Audrie Gallus. Tower Patient Gender: Female PCP Fax : Patient DOB: Apr 19, 1955 Practice Name: [facility] Reason for Call: Caller: [name]/Patient; PCP: [name]; CB#: [phone]; Call regarding Cold symptoms; Onset 04/01/2011; Afebrile; All emergent s/sx of Upper Respiratory infection ruled out. Home care advice given. Wanted to know what kind of Medication she can take OTC. Advised plain Mucinex per package directions. Protocol(s) Used: Upper Respiratory Infection (URI) Recommended Outcome per Protocol: Provide Home/Self Care Reason for Outcome: New onset of two or more of the following symptoms: nasal congestion with runny nose; sneezing; itchy or mild sore throat; mild headache or body aches; mild fatigue; low grade fever up to 101.5 F (38.6C) usually lasting about a week Care Advice: ~ Use a cool mist humidifier to moisten air. Be sure to clean according to manufacturer's instructions. ~ Call provider if symptoms worsen or new symptoms develop. ~ SYMPTOM / CONDITION MANAGEMENT A warm, moist compress placed on face, over eyes for 15 to 20 minutes, 5 to 6 times a day, may help relieve the congestion. ~ Most adults need to drink 6-10 eight-ounce glasses (1.2-2.0 liters) of fluids per day unless previously told to limit fluid intake for other medical reasons. Limit fluids that contain caffeine, sugar or alcohol. Urine will be a very light yellow color when you drink enough fluids. ~ Sore Throat Relief: - Use warm salt water gargles 3 to 4 times/day, as needed (1/2 tsp. salt in 8 oz. [.2 liters] water). - Suck on hard candy, nonprescription or herbal throat lozenges (sugar-free if diabetic) - Eat soothing, soft food/fluids (broths, soups, or honey and lemon juice in hot tea, Popsicles, frozen yogurt or sherbet, scrambled  eggs, cooked cereals, Jell-O or puddings) whichever is most comforting. - Avoid eating salty, spicy or acidic foods. ~ Be aware that some nonprescription drugs contain both an antihistamine and decongestant. If taking a combination drug, do not take an additional decongestant. ~ 04/02/2011 2:02:41AM Page 1 of 1 [id number]

## 2011-04-04 ENCOUNTER — Ambulatory Visit (INDEPENDENT_AMBULATORY_CARE_PROVIDER_SITE_OTHER): Payer: Medicare Other | Admitting: Family Medicine

## 2011-04-04 ENCOUNTER — Encounter: Payer: Self-pay | Admitting: Family Medicine

## 2011-04-04 DIAGNOSIS — K219 Gastro-esophageal reflux disease without esophagitis: Secondary | ICD-10-CM | POA: Diagnosis not present

## 2011-04-04 DIAGNOSIS — R143 Flatulence: Secondary | ICD-10-CM | POA: Diagnosis not present

## 2011-04-04 DIAGNOSIS — K589 Irritable bowel syndrome without diarrhea: Secondary | ICD-10-CM

## 2011-04-04 DIAGNOSIS — R140 Abdominal distension (gaseous): Secondary | ICD-10-CM

## 2011-04-04 DIAGNOSIS — R079 Chest pain, unspecified: Secondary | ICD-10-CM | POA: Diagnosis not present

## 2011-04-04 DIAGNOSIS — R141 Gas pain: Secondary | ICD-10-CM

## 2011-04-04 NOTE — Assessment & Plan Note (Signed)
See assessment for gerd Ref back to GI Disc avoidance of carbonation and gas causing foods

## 2011-04-04 NOTE — Assessment & Plan Note (Signed)
Worse lately along with gas - and this may have caused CP that sent her to the hosp Minimal imp with inc in PPI Will ref back to GI for a visit

## 2011-04-04 NOTE — Assessment & Plan Note (Signed)
Rev hosp records in detail Nl lab/ cxr/ stress echo and chest CT  Now resolved ? If was GI rel  Will ref to GI Update if not starting to improve in a week or if worsening

## 2011-04-04 NOTE — Assessment & Plan Note (Signed)
Ref to GI for this and upper GI problems Disc imp of fiber and fluids

## 2011-04-04 NOTE — Progress Notes (Signed)
Subjective:    Patient ID: Linda Thompson, female    DOB: 07/20/1955, 56 y.o.   MRN: [id number]  HPI Here for f/u from ER On 3/6- went to urgent care for pain in the chest and they sent her to the ER   Several EKG- showed poor R wave progression  ? Old lat infarct / not changed  Per pt did stress EKG and also echo - all normal and CT chest with ca score   She does think stomach is acting up again   Had neg cardiac work up  cxr normal  Labs all normal  Was re assured   Pain was better pretty much by the time she got there  It started at home in am - while sitting -describes it as "pretty bad" - sharp pain in middle of her chest and up under her ribs  Had skipped breakfast  Called a neighbor - who took her to the hospital  Patient Active Problem List  Diagnoses  . FUNGAL DERMATITIS  . MENINGIOMA  . DIABETES MELLITUS, TYPE II  . HYPERLIPIDEMIA  . MENTAL RETARDATION, MODERATE  . GLAUCOMA NOS  . HYPERTENSION  . ALLERGIC  RHINITIS  . GERD  . HIATAL HERNIA  . DERMATITIS, SEBORRHEIC NOS  . PRURITUS ANI  . OSTEOARTHRITIS, FOOT  . HEART MURMUR  . TRANSAMINASES, SERUM, ELEVATED  . Personal history of other musculoskeletal disorders  . IRRITABLE BOWEL SYNDROME  . ANKLE PAIN, LEFT  . Hypokalemia  . Back pain  . Candidal intertrigo  . Fecal incontinence  . Worried well  . Flatulence/gas pain/belching  . Chest pain   Past Medical History  Diagnosis Date  . Allergic rhinitis, cause unspecified   . Pain in joint, ankle and foot   . Backache, unspecified     chronic LBP with radiculopathy  . Seborrheic dermatitis, unspecified   . Type II or unspecified type diabetes mellitus without mention of complication, not stated as uncontrolled   . History of fracture of arm     Right  . History of fracture of foot   . Dermatomycosis, unspecified   . Esophageal reflux   . Unspecified glaucoma   . Undiagnosed cardiac murmurs   . Diaphragmatic hernia without mention of  obstruction or gangrene   . Other and unspecified hyperlipidemia   . Unspecified essential hypertension   . Irritable bowel syndrome   . Benign neoplasm of cerebral meninges   . Moderate intellectual disabilities   . Nausea alone   . Osteoarthrosis, unspecified whether generalized or localized, ankle and foot   . Pruritus ani   . Unspecified tinnitus   . Nonspecific elevation of levels of transaminase or lactic acid dehydrogenase (LDH)   . Leukorrhea, not specified as infective   . Obesity   . Hyperglycemia   . DDD (degenerative disc disease), lumbar    Past Surgical History  Procedure Date  . Total abdominal hysterectomy   . Cataract extraction    History  Substance Use Topics  . Smoking status: Never Smoker   . Smokeless tobacco: Not on file  . Alcohol Use: No   Family History  Problem Relation Age of Onset  . Diabetes Mother   . Diabetes      Aunt  . Stroke      Aunt (had pacemaker)   Allergies  Allergen Reactions  . Ace Inhibitors     REACTION: COUGH  . Augmentin   . Cyclobenzaprine Hcl     REACTION: diarrhea  .  Flonase (Fluticasone Propionate)     nosebleed  . Ibuprofen     REACTION: vomiting  . HQI:ONGEXBMWUXL+KGMWNUUVO+ZDGUYQIHKV Acid+Aspartame     REACTION: diarrhea   Current Outpatient Prescriptions on File Prior to Visit  Medication Sig Dispense Refill  . aspirin 81 MG tablet Take 81 mg by mouth daily.        Marland Kitchen diltiazem (CARDIZEM CD) 240 MG 24 hr capsule Take 240 mg by mouth daily.      . hydrochlorothiazide (MICROZIDE) 12.5 MG capsule Take 12.5 mg by mouth daily.      Marland Kitchen ketoconazole (NIZORAL) 2 % cream Apply 1 application topically 2 (two) times daily.      Marland Kitchen ketoconazole (NIZORAL) 2 % shampoo Apply 1 application topically 2 (two) times a week.      . metFORMIN (GLUCOPHAGE) 500 MG tablet Take 500 mg by mouth 2 (two) times daily with a meal.      . nystatin (MYCOSTATIN) powder Apply 1 Units topically 3 (three) times daily.      Marland Kitchen omeprazole  (PRILOSEC) 40 MG capsule Take 40 mg by mouth daily.      . potassium chloride SA (K-DUR,KLOR-CON) 20 MEQ tablet Take 20 mEq by mouth daily.      . pravastatin (PRAVACHOL) 40 MG tablet Take 40 mg by mouth every evening.           Review of Systems Review of Systems  Constitutional: Negative for fever, appetite change, fatigue and unexpected weight change.  Eyes: Negative for pain and visual disturbance.  Respiratory: Negative for cough and shortness of breath.   Cardiovascular: Negative for cp or palpitations   (cp is completely gone now) Gastrointestinal: Negative for nausea, diarrhea and constipation. pos for belching and occ stomach (epigastric) pain  Genitourinary: Negative for urgency and frequency.  Skin: Negative for pallor or rash   Neurological: Negative for weakness, light-headedness, numbness and headaches.  Hematological: Negative for adenopathy. Does not bruise/bleed easily.  Psychiatric/Behavioral: Negative for dysphoric mood. The patient may be a bit nervous/ anxious        Objective:   Physical Exam  Constitutional: She appears well-developed and well-nourished. No distress.       Obese and well appearing   HENT:  Head: Normocephalic and atraumatic.  Mouth/Throat: Oropharynx is clear and moist.  Eyes: Conjunctivae and EOM are normal. Pupils are equal, round, and reactive to light. No scleral icterus.  Neck: Normal range of motion. Neck supple. No JVD present. Carotid bruit is not present. No thyromegaly present.  Cardiovascular: Normal rate, regular rhythm, normal heart sounds and intact distal pulses.  Exam reveals no gallop.   No murmur heard. Pulmonary/Chest: Effort normal and breath sounds normal. No respiratory distress. She has no wheezes.  Abdominal: Soft. Bowel sounds are normal. She exhibits no distension and no mass. There is tenderness. There is no rebound and no guarding.       Very mild epigastric tenderness   Musculoskeletal: She exhibits no edema  and no tenderness.  Lymphadenopathy:    She has no cervical adenopathy.  Neurological: She is alert. She has normal reflexes. No cranial nerve deficit. She exhibits normal muscle tone. Coordination normal.  Skin: Skin is warm and dry. No rash noted. No erythema. No pallor.  Psychiatric: She has a normal mood and affect.       Baseline MR- repeats phrases often Pleasant and in no distress           Assessment & Plan:

## 2011-04-04 NOTE — Patient Instructions (Signed)
I wonder if your acid reflux/ stomach problem could have caused your chest pain In light of this and recent stomach and bowel troubles I want you to go back to GI We will do GI referral at check up  Please have someone go with you to that appt Update me earlier if symptoms worsen

## 2011-04-07 ENCOUNTER — Telehealth: Payer: Self-pay | Admitting: Family Medicine

## 2011-04-07 NOTE — Telephone Encounter (Signed)
Left vm for pt to callback 

## 2011-04-07 NOTE — Telephone Encounter (Signed)
Triage Record Num: [id number] Operator: Migdalia Dk Patient Name: Vonne Mcdanel Call Date & Time: 04/07/2011 3:16:58AM Patient Phone: [phone] PCP: Audrie Gallus. Tower Patient Gender: Female PCP Fax : Patient DOB: [birth year] Practice Name: [facility] Reason for Call: Caller: Karissa/Patient; PCP: [name]; CB#: [phone]; Call regarding Abdominal pain; States seen in office on 04/04/2011 due to stomach pain, increased Omeprazole from 20mg  to 40 mg. States has appt. with GI MD on 04/23/2011. This a.m. with intermittent abdominal pain, pain in lower abdomen. NO pain at current time, but states pain does come and go. Care advice given per Abdominal Pain Guideline, pt. to follow up with office in a.m. Protocol(s) Used: Abdominal Pain Recommended Outcome per Protocol: See Provider within 24 hours Reason for Outcome: New onset constant mild or aching lower abdominal pain Care Advice: ~ Soak in warm bath or place heating pad set on low on abdomen to aid in pain relief. Lying down with legs elevated supported by pillow under knees or lying on side with knees pulled up to chest may help provide relief during more severe cramping. ~ ~ Rest as much as possible. 04/07/2011 3:24:36AM Page 1 of 1 [email or web address]

## 2011-04-07 NOTE — Telephone Encounter (Signed)
Please check in with her - if abd pain has subsided she does not need to f/u with me before her GI appt thanks

## 2011-04-07 NOTE — Telephone Encounter (Signed)
Patient notified as instructed when pt came by office. Pt said GI appt had been changed until tomorrow 04/08/11 at 1:30pm. Pt will call back if need be seen here before GI appt.

## 2011-04-08 ENCOUNTER — Encounter: Payer: Self-pay | Admitting: Gastroenterology

## 2011-04-08 ENCOUNTER — Ambulatory Visit (INDEPENDENT_AMBULATORY_CARE_PROVIDER_SITE_OTHER): Payer: Medicare Other | Admitting: Gastroenterology

## 2011-04-08 VITALS — BP 112/82 | HR 103 | Ht 62.0 in | Wt 195.4 lb

## 2011-04-08 DIAGNOSIS — R1032 Left lower quadrant pain: Secondary | ICD-10-CM | POA: Diagnosis not present

## 2011-04-08 MED ORDER — PEG-KCL-NACL-NASULF-NA ASC-C 100 G PO SOLR: 1.0000 | Freq: Once | ORAL | Status: DC

## 2011-04-08 MED ORDER — GLYCOPYRROLATE 2 MG PO TABS: ORAL_TABLET | ORAL | Status: DC

## 2011-04-08 NOTE — Assessment & Plan Note (Addendum)
Abdominal pain is very nonspecific. I think it is unlikely that she has an active intra-abdominal process. She has never had a colonoscopy.  Recommendations #1 trial of hyomax when necessary #2 screening colonoscopy

## 2011-04-08 NOTE — Progress Notes (Signed)
History of Present Illness: Ms. Linda Thompson is a 56 year old white female referred at the request of Dr. Dimas Aguas for evaluation of abdominal pain. The patient is a limited historian. She complains of pain throughout her lower abdomen. It is unrelated to eating or moving her bowels. She denies change in bowel habits, nausea, pyrosis, melena or hematochezia. She was seen in the emergency room earlier this month for chest pain. Cardiac etiology was ruled out.    Past Medical History  Diagnosis Date  . Allergic rhinitis, cause unspecified   . Pain in joint, ankle and foot   . Backache, unspecified     chronic LBP with radiculopathy  . Seborrheic dermatitis, unspecified   . Type II or unspecified type diabetes mellitus without mention of complication, not stated as uncontrolled   . History of fracture of arm     Right  . History of fracture of foot   . Dermatomycosis, unspecified   . Esophageal reflux   . Unspecified glaucoma   . Undiagnosed cardiac murmurs   . Diaphragmatic hernia without mention of obstruction or gangrene   . Other and unspecified hyperlipidemia   . Unspecified essential hypertension   . Irritable bowel syndrome   . Benign neoplasm of cerebral meninges   . Moderate intellectual disabilities   . Nausea alone   . Osteoarthrosis, unspecified whether generalized or localized, ankle and foot   . Pruritus ani   . Unspecified tinnitus   . Nonspecific elevation of levels of transaminase or lactic acid dehydrogenase (LDH)   . Leukorrhea, not specified as infective   . Obesity   . Hyperglycemia   . DDD (degenerative disc disease), lumbar    Past Surgical History  Procedure Date  . Total abdominal hysterectomy   . Cataract extraction    family history includes Diabetes in her mother and unspecified family member and Stroke in an unspecified family member. Current Outpatient Prescriptions  Medication Sig Dispense Refill  . aspirin 81 MG tablet Take 81 mg by mouth daily.          Marland Kitchen diltiazem (CARDIZEM CD) 240 MG 24 hr capsule Take 240 mg by mouth daily.      . hydrochlorothiazide (MICROZIDE) 12.5 MG capsule Take 12.5 mg by mouth daily.      . hydrocortisone 2.5 % cream Apply topically every day to affected area      . ketoconazole (NIZORAL) 2 % cream Apply 1 application topically 2 (two) times daily.      Marland Kitchen ketoconazole (NIZORAL) 2 % shampoo Apply 1 application topically 2 (two) times a week.      . metFORMIN (GLUCOPHAGE) 500 MG tablet Take 500 mg by mouth 2 (two) times daily with a meal.      . nystatin (MYCOSTATIN) powder Apply 1 Units topically 3 (three) times daily.      Marland Kitchen omeprazole (PRILOSEC) 40 MG capsule Take 40 mg by mouth daily.      . potassium chloride SA (K-DUR,KLOR-CON) 20 MEQ tablet Take 20 mEq by mouth daily.      . pravastatin (PRAVACHOL) 40 MG tablet Take 40 mg by mouth every evening.      . psyllium (METAMUCIL) 58.6 % powder Take 1 packet by mouth 3 (three) times daily.       Allergies as of 04/08/2011 - Review Complete 04/08/2011  Allergen Reaction Noted  . Ace inhibitors  11/18/2005  . Augmentin  01/07/2011  . Cyclobenzaprine hcl  03/26/2010  . Flonase (fluticasone propionate)  05/29/2010  .  Ibuprofen  11/18/2005  . OZH:YQMVHQIONGE+XBMWUXLKG+MWNUUVOZDG acid+aspartame      reports that she has never smoked. She has never used smokeless tobacco. She reports that she does not drink alcohol or use illicit drugs.     Review of Systems: Pertinent positive and negative review of systems were noted in the above HPI section. All other review of systems were otherwise negative.  Vital signs were reviewed in today's medical record Physical Exam: General: Well developed , well nourished, no acute distress Head: Normocephalic and atraumatic Eyes:  sclerae anicteric, EOMI Ears: Normal auditory acuity Mouth: No deformity or lesions Neck: Supple, no masses or thyromegaly Lungs: Clear throughout to auscultation Heart: Regular rate and rhythm; no  murmurs, rubs or bruits Abdomen: Soft, non tender and non distended. No masses, hepatosplenomegaly or hernias noted. Normal Bowel sounds Rectal:deferred Musculoskeletal: Symmetrical with no gross deformities  Skin: No lesions on visible extremities Pulses:  Normal pulses noted Extremities: No clubbing, cyanosis, edema or deformities noted Neurological: Alert oriented x 4, grossly nonfocal Cervical Nodes:  No significant cervical adenopathy Inguinal Nodes: No significant inguinal adenopathy Psychological:  Alert and cooperative. Normal mood and affect

## 2011-04-08 NOTE — Patient Instructions (Addendum)
We will send in your new prescription to your pharmacy today Call back as needed We have scheduled you for a colonoscopy Separate instructions have been given

## 2011-04-10 DIAGNOSIS — L538 Other specified erythematous conditions: Secondary | ICD-10-CM | POA: Diagnosis not present

## 2011-04-11 ENCOUNTER — Telehealth: Payer: Self-pay | Admitting: Gastroenterology

## 2011-04-11 NOTE — Telephone Encounter (Signed)
Returned patients call, answered all questions  Told pt to call if anymore questions

## 2011-04-15 ENCOUNTER — Ambulatory Visit (AMBULATORY_SURGERY_CENTER): Payer: Medicare Other | Admitting: Gastroenterology

## 2011-04-15 ENCOUNTER — Telehealth: Payer: Self-pay | Admitting: Gastroenterology

## 2011-04-15 ENCOUNTER — Ambulatory Visit: Payer: Medicare Other | Admitting: Family Medicine

## 2011-04-15 ENCOUNTER — Encounter: Payer: Self-pay | Admitting: Gastroenterology

## 2011-04-15 VITALS — BP 132/83 | HR 87 | Temp 97.0°F | Resp 20 | Ht 62.0 in | Wt 195.0 lb

## 2011-04-15 DIAGNOSIS — R109 Unspecified abdominal pain: Secondary | ICD-10-CM | POA: Diagnosis not present

## 2011-04-15 DIAGNOSIS — R1032 Left lower quadrant pain: Secondary | ICD-10-CM | POA: Diagnosis not present

## 2011-04-15 DIAGNOSIS — F411 Generalized anxiety disorder: Secondary | ICD-10-CM | POA: Diagnosis not present

## 2011-04-15 HISTORY — PX: COLONOSCOPY: SHX174

## 2011-04-15 LAB — GLUCOSE, CAPILLARY: Glucose-Capillary: 94 mg/dL (ref 70–99)

## 2011-04-15 MED ORDER — SODIUM CHLORIDE 0.9 % IV SOLN: 500.0000 mL | INTRAVENOUS | Status: DC

## 2011-04-15 NOTE — Progress Notes (Signed)
Patient did not experience any of the following events: a burn prior to discharge; a fall within the facility; wrong site/side/patient/procedure/implant event; or a hospital transfer or hospital admission upon discharge from the facility. (G8907) Patient did not have preoperative order for IV antibiotic SSI prophylaxis. (G8918)  

## 2011-04-15 NOTE — Telephone Encounter (Signed)
Returned patient's call and she states that she did drink prep at 5:00 pm yesterday and started drinking second half at 9:00 am this morning.  She was concerned because she states she was "peeing a lot" but has not moved her bowels.  I advised her to complete the second prep and come on in at 1:00 pm as scheduled and if needed, we would give her an enema.  She agreed.

## 2011-04-15 NOTE — Op Note (Signed)
[facility] [address] [place], [place]  [address]  COLONOSCOPY PROCEDURE REPORT  PATIENT:  Linda Thompson, Linda Thompson  MR#:  [id number] BIRTHDATE:  [birth year], 55 yrs. old  GENDER:  female ENDOSCOPIST:  Barbette Hair. Arlyce Dice, MD REF. BY:  Marne A. Milinda Antis, M.D. PROCEDURE DATE:  04/15/2011 PROCEDURE:  Diagnostic Colonoscopy ASA CLASS:  Class II INDICATIONS:  Abdominal pain MEDICATIONS:   MAC sedation, administered by CRNA propofol 200mg IV  DESCRIPTION OF PROCEDURE:   After the risks benefits and alternatives of the procedure were thoroughly explained, informed consent was obtained.  Digital rectal exam was performed and revealed no abnormalities.   The LB CF-[id number] endoscope was introduced through the anus and advanced to the cecum, which was identified by both the appendix and ileocecal valve, without limitations.  The quality of the prep was excellent, using MoviPrep.  The instrument was then slowly withdrawn as the colon was fully examined. <<PROCEDUREIMAGES>>  FINDINGS:  A normal appearing cecum, ileocecal valve, and appendiceal orifice were identified. The ascending, hepatic flexure, transverse, splenic flexure, descending, sigmoid colon, and rectum appeared unremarkable (see image1 and image2). Retroflexed views in the rectum revealed no abnormalities.    The time to cecum =  1) 4.75  minutes. The scope was then withdrawn in 1) 6.25  minutes from the cecum and the procedure completed. COMPLICATIONS:  None ENDOSCOPIC IMPRESSION: 1) Normal colon RECOMMENDATIONS: 1) Continue current medications - hyomax as needed 2) OP follow-up is advised on a PRN basis. REPEAT EXAM:  10 years  ______________________________ Barbette Hair. Arlyce Dice, MD  CC:  n. eSIGNED:   Barbette Hair. Lorenna Lurry at 04/15/2011 02:31 PM  Donavan Burnet, [id number]

## 2011-04-15 NOTE — Patient Instructions (Signed)
YOU HAD AN ENDOSCOPIC PROCEDURE TODAY AT THE [facility]: Refer to the procedure report that was given to you for any specific questions about what was found during the examination.  If the procedure report does not answer your questions, please call your gastroenterologist to clarify.  If you requested that your care partner not be given the details of your procedure findings, then the procedure report has been included in a sealed envelope for you to review at your convenience later.  YOU SHOULD EXPECT: Some feelings of bloating in the abdomen. Passage of more gas than usual.  Walking can help get rid of the air that was put into your GI tract during the procedure and reduce the bloating. If you had a lower endoscopy (such as a colonoscopy or flexible sigmoidoscopy) you may notice spotting of blood in your stool or on the toilet paper. If you underwent a bowel prep for your procedure, then you may not have a normal bowel movement for a few days.  DIET: Your first meal following the procedure should be a light meal and then it is ok to progress to your normal diet.  A half-sandwich or bowl of soup is an example of a good first meal.  Heavy or fried foods are harder to digest and may make you feel nauseous or bloated.  Likewise meals heavy in dairy and vegetables can cause extra gas to form and this can also increase the bloating.  Drink plenty of fluids but you should avoid alcoholic beverages for 24 hours.  ACTIVITY: Your care partner should take you home directly after the procedure.  You should plan to take it easy, moving slowly for the rest of the day.  You can resume normal activity the day after the procedure however you should NOT DRIVE or use heavy machinery for 24 hours (because of the sedation medicines used during the test).    SYMPTOMS TO REPORT IMMEDIATELY: A gastroenterologist can be reached at any hour.  During normal business hours, 8:30 AM to 5:00 PM Monday through Friday,  call [phone].  After hours and on weekends, please call the GI answering service at [phone] who will take a message and have the physician on call contact you.   Following lower endoscopy (colonoscopy or flexible sigmoidoscopy):  Excessive amounts of blood in the stool  Significant tenderness or worsening of abdominal pains  Swelling of the abdomen that is new, acute  Fever of 100F or higher    FOLLOW UP: If any biopsies were taken you will be contacted by phone or by letter within the next 1-3 weeks.  Call your gastroenterologist if you have not heard about the biopsies in 3 weeks.  Our staff will call the home number listed on your records the next business day following your procedure to check on you and address any questions or concerns that you may have at that time regarding the information given to you following your procedure. This is a courtesy call and so if there is no answer at the home number and we have not heard from you through the emergency physician on call, we will assume that you have returned to your regular daily activities without incident.  SIGNATURES/CONFIDENTIALITY: You and/or your care partner have signed paperwork which will be entered into your electronic medical record.  These signatures attest to the fact that that the information above on your After Visit Summary has been reviewed and is understood.  Full responsibility of the confidentiality   of this discharge information lies with you and/or your care-partner.     

## 2011-04-16 ENCOUNTER — Telehealth: Payer: Self-pay | Admitting: *Deleted

## 2011-04-16 NOTE — Telephone Encounter (Signed)
  Follow up Call-  Call back number 04/15/2011  Post procedure Call Back phone  # [phone]  Permission to leave phone message Yes     Patient questions:    Left message that we called to check on pt.

## 2011-04-22 ENCOUNTER — Ambulatory Visit (INDEPENDENT_AMBULATORY_CARE_PROVIDER_SITE_OTHER): Payer: Medicare Other | Admitting: Family Medicine

## 2011-04-22 ENCOUNTER — Encounter: Payer: Self-pay | Admitting: Family Medicine

## 2011-04-22 VITALS — BP 132/76 | HR 72 | Temp 97.5°F | Ht 62.0 in | Wt 195.0 lb

## 2011-04-22 DIAGNOSIS — J309 Allergic rhinitis, unspecified: Secondary | ICD-10-CM

## 2011-04-22 NOTE — Assessment & Plan Note (Signed)
Seasonal with tree pollen - sympt of runny nose /sneeze/itchy eyes Recommend sticking with store brand claritin daily 10 mg  Update if fever/ facial pain or no imp  Disc ways to avoid pollen exp

## 2011-04-22 NOTE — Progress Notes (Signed)
Subjective:    Patient ID: Linda Thompson, female    DOB: 09/20/1955, 56 y.o.   MRN: [id number]  HPI Is here for what she thinks is allergy symptoms from pollen  Drainage in throat and constantly clearing it  Sneezing this am  Went to pharmacy and got an antihistamine - store brand for claritin  Also eyes are a bit itchy   No fever Feels fine No colored nasal discharge No cough or wheeze   Patient Active Problem List  Diagnoses  . FUNGAL DERMATITIS  . MENINGIOMA  . DIABETES MELLITUS, TYPE II  . HYPERLIPIDEMIA  . MENTAL RETARDATION, MODERATE  . GLAUCOMA NOS  . HYPERTENSION  . ALLERGIC  RHINITIS  . GERD  . HIATAL HERNIA  . DERMATITIS, SEBORRHEIC NOS  . PRURITUS ANI  . OSTEOARTHRITIS, FOOT  . HEART MURMUR  . TRANSAMINASES, SERUM, ELEVATED  . Personal history of other musculoskeletal disorders  . IRRITABLE BOWEL SYNDROME  . ANKLE PAIN, LEFT  . Hypokalemia  . Back pain  . Candidal intertrigo  . Fecal incontinence  . Worried well  . Flatulence/gas pain/belching  . Chest pain  . Abdominal pain, left lower quadrant   Past Medical History  Diagnosis Date  . Allergic rhinitis, cause unspecified   . Pain in joint, ankle and foot   . Backache, unspecified     chronic LBP with radiculopathy  . Seborrheic dermatitis, unspecified   . Type II or unspecified type diabetes mellitus without mention of complication, not stated as uncontrolled   . History of fracture of arm     Right  . History of fracture of foot   . Dermatomycosis, unspecified   . Esophageal reflux   . Unspecified glaucoma   . Undiagnosed cardiac murmurs   . Diaphragmatic hernia without mention of obstruction or gangrene   . Other and unspecified hyperlipidemia   . Unspecified essential hypertension   . Irritable bowel syndrome   . Benign neoplasm of cerebral meninges   . Moderate intellectual disabilities   . Nausea alone   . Osteoarthrosis, unspecified whether generalized or localized, ankle and  foot   . Pruritus ani   . Unspecified tinnitus   . Nonspecific elevation of levels of transaminase or lactic acid dehydrogenase (LDH)   . Leukorrhea, not specified as infective   . Obesity   . Hyperglycemia   . DDD (degenerative disc disease), lumbar    Past Surgical History  Procedure Date  . Total abdominal hysterectomy   . Cataract extraction   . Elbow surgery     right elbow   History  Substance Use Topics  . Smoking status: Never Smoker   . Smokeless tobacco: Never Used  . Alcohol Use: No   Family History  Problem Relation Age of Onset  . Diabetes Mother   . Diabetes      Aunt  . Stroke      Aunt (had pacemaker)   Allergies  Allergen Reactions  . Ace Inhibitors     REACTION: COUGH  . Augmentin Other (See Comments)    Pt unaware of reaction to augmentin  . Cyclobenzaprine Hcl     REACTION: diarrhea  . Flonase (Fluticasone Propionate)     nosebleed  . Ibuprofen     REACTION: vomiting  . WUJ:WJXBJYNWGNF+AOZHYQMVH+QIONGEXBMW Acid+Aspartame     REACTION: diarrhea   Current Outpatient Prescriptions on File Prior to Visit  Medication Sig Dispense Refill  . aspirin 81 MG tablet Take 81 mg by mouth daily.        Marland Kitchen  diltiazem (CARDIZEM CD) 240 MG 24 hr capsule Take 240 mg by mouth daily.      Marland Kitchen glycopyrrolate (ROBINUL) 2 MG tablet Take one tab 3 times a day as needed for abdominal pain  30 tablet  1  . hydrochlorothiazide (MICROZIDE) 12.5 MG capsule Take 12.5 mg by mouth daily.      . hydrocortisone 2.5 % cream Apply topically every day to affected area      . ketoconazole (NIZORAL) 2 % cream Apply 1 application topically 2 (two) times daily.      Marland Kitchen ketoconazole (NIZORAL) 2 % shampoo Apply 1 application topically 2 (two) times a week.      . latanoprost (XALATAN) 0.005 % ophthalmic solution       . loratadine (CLARITIN) 10 MG tablet Take 10 mg by mouth daily.      . metFORMIN (GLUCOPHAGE) 500 MG tablet Take 500 mg by mouth 2 (two) times daily with a meal.      .  nystatin (MYCOSTATIN) powder Apply 1 Units topically 3 (three) times daily.      . potassium chloride SA (K-DUR,KLOR-CON) 20 MEQ tablet Take 20 mEq by mouth daily.      . pravastatin (PRAVACHOL) 40 MG tablet Take 40 mg by mouth every evening.      . psyllium (METAMUCIL) 58.6 % powder Take 1 packet by mouth 3 (three) times daily.      . solifenacin (VESICARE) 5 MG tablet Take 1 tablet (5 mg total) by mouth daily.  30 tablet  1  . DISCONTD: Calcium Carbonate-Vitamin D (CALCIUM-VITAMIN D) 500-200 MG-UNIT per tablet Take 1 tablet by mouth 2 (two) times daily with a meal.            Review of Systems Review of Systems  Constitutional: Negative for fever, appetite change, fatigue and unexpected weight change.  Eyes: Negative for pain and visual disturbance.  ENT pos for sneeze and post nasal drip  No sinus tenderness neg for mouth or throat swelling  Respiratory: Negative for cough and shortness of breath.  neg for wheeze  Cardiovascular: Negative for cp or palpitations    Gastrointestinal: Negative for nausea, diarrhea and constipation.  Genitourinary: Negative for urgency and frequency.  Skin: Negative for pallor or rash   Neurological: Negative for weakness, light-headedness, numbness and headaches.  Hematological: Negative for adenopathy. Does not bruise/bleed easily.  Psychiatric/Behavioral: Negative for dysphoric mood. The patient is not nervous/anxious.          Objective:   Physical Exam  Constitutional: She appears well-nourished. No distress.  HENT:  Head: Normocephalic and atraumatic.  Right Ear: External ear normal.  Left Ear: External ear normal.  Mouth/Throat: Oropharynx is clear and moist. No oropharyngeal exudate.       Scant cerumen with clear TMs  Some clear post nasal drip Nares are pale and boggy/ clear rhinorrhea  No sinus tenderness   Eyes: Conjunctivae and EOM are normal. Pupils are equal, round, and reactive to light. Right eye exhibits no discharge. Left eye  exhibits no discharge.       Pt does occ rub her eyes They are not red   Neck: Normal range of motion. Neck supple. No JVD present. Carotid bruit is not present. No thyromegaly present.  Cardiovascular: Normal rate, regular rhythm and normal heart sounds.   Pulmonary/Chest: Effort normal and breath sounds normal. No respiratory distress. She has no wheezes. She has no rales.  Abdominal: Soft. Bowel sounds are normal. She exhibits no distension  and no mass. There is no tenderness.  Lymphadenopathy:    She has no cervical adenopathy.  Neurological: She is alert.  Skin: Skin is warm and dry. No rash noted.  Psychiatric: She has a normal mood and affect.       Baseline MR Cheerful Repeats herself frequently          Assessment & Plan:

## 2011-04-22 NOTE — Patient Instructions (Addendum)
Continue the allergy medicine you are taking through the spring once daily (it is the store brand of claritin 10 mg)  Also wash hands well when you come in from outdoors  Drink lots of water  If you develop fever or other symptoms- let me know You will likely have allergy symptoms through the spring from pollen

## 2011-04-23 ENCOUNTER — Ambulatory Visit: Payer: Medicare Other | Admitting: Gastroenterology

## 2011-04-24 ENCOUNTER — Other Ambulatory Visit: Payer: Self-pay | Admitting: Family Medicine

## 2011-04-24 ENCOUNTER — Telehealth: Payer: Self-pay | Admitting: Family Medicine

## 2011-04-24 MED ORDER — OMEPRAZOLE 20 MG PO CPDR: 20.0000 mg | DELAYED_RELEASE_CAPSULE | Freq: Two times a day (BID) | ORAL | Status: DC

## 2011-04-24 NOTE — Telephone Encounter (Signed)
Rx sent to [facility] electronically, updated on med list, patient was advised via message left on machine at home.

## 2011-04-24 NOTE — Telephone Encounter (Signed)
Go ahead and call in the 20 mg bid if they will pay for that ,, 60 with 11 ref

## 2011-04-24 NOTE — Telephone Encounter (Signed)
Pt came by and is saying that her pharmacist is saying that she should get her Omeprazole DR 40 mg changed to 20 mg because of insurance. Her insurance will pay for 20 mg and not 40 mg. She could just double with the 20 mg.

## 2011-04-25 ENCOUNTER — Encounter (HOSPITAL_COMMUNITY): Payer: Self-pay | Admitting: *Deleted

## 2011-04-25 ENCOUNTER — Emergency Department (INDEPENDENT_AMBULATORY_CARE_PROVIDER_SITE_OTHER)
Admission: EM | Admit: 2011-04-25 | Discharge: 2011-04-25 | Disposition: A | Discharge status: Home / Self Care | Payer: Medicare Other | Source: Home / Self Care | Attending: Family Medicine | Admitting: Family Medicine

## 2011-04-25 ENCOUNTER — Telehealth: Payer: Self-pay | Admitting: Family Medicine

## 2011-04-25 DIAGNOSIS — IMO0002 Reserved for concepts with insufficient information to code with codable children: Secondary | ICD-10-CM

## 2011-04-25 DIAGNOSIS — N3281 Overactive bladder: Secondary | ICD-10-CM

## 2011-04-25 DIAGNOSIS — N318 Other neuromuscular dysfunction of bladder: Secondary | ICD-10-CM | POA: Diagnosis not present

## 2011-04-25 LAB — POCT URINALYSIS DIP (DEVICE)
Glucose, UA: NEGATIVE mg/dL
Hgb urine dipstick: NEGATIVE
Ketones, ur: NEGATIVE mg/dL
Specific Gravity, Urine: 1.02 (ref 1.005–1.030)
Urobilinogen, UA: 0.2 mg/dL (ref 0.0–1.0)

## 2011-04-25 MED ORDER — SOLIFENACIN SUCCINATE 5 MG PO TABS: 5.0000 mg | ORAL_TABLET | Freq: Every day | ORAL | Status: DC

## 2011-04-25 NOTE — ED Provider Notes (Signed)
History     CSN: [id number]  Arrival date & time 04/25/11  0902   First MD Initiated Contact with Patient 04/25/11 0920      Chief Complaint  Patient presents with  . Back Pain  . Urinary Frequency    (Consider location/radiation/quality/duration/timing/severity/associated sxs/prior treatment) Patient is a 56 y.o. female presenting with back pain and frequency. The history is provided by the patient.  Back Pain  This is a new problem. The current episode started 2 days ago. The problem has not changed since onset.The pain is associated with no known injury. The pain is present in the sacro-iliac joint. The pain radiates to the left thigh. The pain is mild. The symptoms are aggravated by bending. Pertinent negatives include no fever, no abdominal pain, no bladder incontinence and no dysuria.  Urinary Frequency Pertinent negatives include no abdominal pain.    Past Medical History  Diagnosis Date  . Allergic rhinitis, cause unspecified   . Pain in joint, ankle and foot   . Backache, unspecified     chronic LBP with radiculopathy  . Seborrheic dermatitis, unspecified   . Type II or unspecified type diabetes mellitus without mention of complication, not stated as uncontrolled   . History of fracture of arm     Right  . History of fracture of foot   . Dermatomycosis, unspecified   . Esophageal reflux   . Unspecified glaucoma   . Undiagnosed cardiac murmurs   . Diaphragmatic hernia without mention of obstruction or gangrene   . Other and unspecified hyperlipidemia   . Unspecified essential hypertension   . Irritable bowel syndrome   . Benign neoplasm of cerebral meninges   . Moderate intellectual disabilities   . Nausea alone   . Osteoarthrosis, unspecified whether generalized or localized, ankle and foot   . Pruritus ani   . Unspecified tinnitus   . Nonspecific elevation of levels of transaminase or lactic acid dehydrogenase (LDH)   . Leukorrhea, not specified as infective    . Obesity   . Hyperglycemia   . DDD (degenerative disc disease), lumbar     Past Surgical History  Procedure Date  . Total abdominal hysterectomy   . Cataract extraction   . Elbow surgery     right elbow    Family History  Problem Relation Age of Onset  . Diabetes Mother   . Diabetes      Aunt  . Stroke      Aunt (had pacemaker)    History  Substance Use Topics  . Smoking status: Never Smoker   . Smokeless tobacco: Never Used  . Alcohol Use: No    OB History    Grav Para Term Preterm Abortions TAB SAB Ect Mult Living                  Review of Systems  Constitutional: Negative for fever.  Gastrointestinal: Negative.  Negative for abdominal pain.  Genitourinary: Positive for frequency. Negative for bladder incontinence, dysuria and urgency.  Musculoskeletal: Positive for back pain.    Allergies  Ace inhibitors; Augmentin; Cyclobenzaprine hcl; Flonase; Ibuprofen; and WUJ:WJXBJYNWGNF+AOZHYQMVH+QIONGEXBMW acid+aspartame  Home Medications   Current Outpatient Rx  Name Route Sig Dispense Refill  . ASPIRIN 81 MG PO TABS Oral Take 81 mg by mouth daily.      Marland Kitchen DILTIAZEM HCL ER COATED BEADS 240 MG PO CP24 Oral Take 240 mg by mouth daily.    Marland Kitchen GLYCOPYRROLATE 2 MG PO TABS  Take one tab 3  times a day as needed for abdominal pain 30 tablet 1  . HYDROCHLOROTHIAZIDE 12.5 MG PO CAPS Oral Take 12.5 mg by mouth daily.    Marland Kitchen HYDROCORTISONE 2.5 % EX CREA  Apply topically every day to affected area    . KETOCONAZOLE 2 % EX CREA Topical Apply 1 application topically 2 (two) times daily.    Marland Kitchen KETOCONAZOLE 2 % EX SHAM Topical Apply 1 application topically 2 (two) times a week.    Marland Kitchen LATANOPROST 0.005 % OP SOLN      . LORATADINE 10 MG PO TABS Oral Take 10 mg by mouth daily.    Marland Kitchen METFORMIN HCL 500 MG PO TABS Oral Take 500 mg by mouth 2 (two) times daily with a meal.    . NYSTATIN 100000 UNIT/GM EX POWD Topical Apply 1 Units topically 3 (three) times daily.    Marland Kitchen OMEPRAZOLE 20 MG PO  CPDR Oral Take 1 capsule (20 mg total) by mouth 2 (two) times daily. 60 capsule 11  . POTASSIUM CHLORIDE CRYS ER 20 MEQ PO TBCR Oral Take 20 mEq by mouth daily.    Marland Kitchen PRAVASTATIN SODIUM 40 MG PO TABS Oral Take 40 mg by mouth every evening.    . PSYLLIUM 58.6 % PO POWD Oral Take 1 packet by mouth 3 (three) times daily.    Marland Kitchen SOLIFENACIN SUCCINATE 5 MG PO TABS Oral Take 1 tablet (5 mg total) by mouth daily. 30 tablet 1    BP 145/93  Pulse 100  Temp(Src) 97.5 F (36.4 C) (Oral)  Resp 14  SpO2 96%  Physical Exam  Nursing note and vitals reviewed. Constitutional: She appears well-developed and well-nourished.  Abdominal: Soft. Bowel sounds are normal.  Musculoskeletal: She exhibits tenderness.       Back:    ED Course  Procedures (including critical care time)  Labs Reviewed  POCT URINALYSIS DIP (DEVICE) - Abnormal; Notable for the following:    Leukocytes, UA TRACE (*) Biochemical Testing Only. Please order routine urinalysis from main lab if confirmatory testing is needed.   All other components within normal limits   No results found.   1. Overactive bladder   2. Degenerative disc disease       MDM  U/a neg        Linna Hoff, MD 04/25/11 [phone]

## 2011-04-25 NOTE — Telephone Encounter (Signed)
Pt came by again today and she went to Urgent care for her back pain. They gave her a RX for Vesicare and it's going to cost her $83.46 for the RX and she can't afford that. The pharmacy told her to come down here and see if Dr. Milinda Antis could write a rx for something else instead of Vesicare. Wasn't sure if she could write something else since she didn't see her today for this ???

## 2011-04-25 NOTE — Telephone Encounter (Signed)
Patient advised as instructed via telephone, appt scheduled for 04/30/2011 at 9:00 with Dr. Milinda Antis.

## 2011-04-25 NOTE — Discharge Instructions (Signed)
Use heat on back and use medicine as prescribed for bladder frequency, see your doctor if further problems.

## 2011-04-25 NOTE — ED Notes (Signed)
Pt is here with complaints of lower back pain and frequent urination.  Pt has history of arthritis in her hip and a disc issue.  Pt has been given cortisone injections in her back twice.  Pt denies any abdominal pain, vaginal discharge, irritation or rash.

## 2011-04-25 NOTE — Telephone Encounter (Signed)
Let her know that I do not want her to take that or sub for it until I talk to her more since those medicines tend to cause a lot of side eff Make her a f/u with me if poss next week--thanks I did rev her urgent care note

## 2011-04-30 ENCOUNTER — Ambulatory Visit (INDEPENDENT_AMBULATORY_CARE_PROVIDER_SITE_OTHER): Payer: Medicare Other | Admitting: Family Medicine

## 2011-04-30 ENCOUNTER — Encounter: Payer: Self-pay | Admitting: Family Medicine

## 2011-04-30 VITALS — BP 130/90 | HR 86 | Temp 97.8°F | Ht 62.0 in | Wt 194.2 lb

## 2011-04-30 DIAGNOSIS — M549 Dorsalgia, unspecified: Secondary | ICD-10-CM | POA: Diagnosis not present

## 2011-04-30 NOTE — Patient Instructions (Signed)
Your back pain is from muscles in your back - not a urinary tract infection Do not fill the vesicare- you do not need it  Keep active - take walks every day if you can  If back pain worsens we will need to get you an appointment with Dr Kenyon Ana can use heat when your back bothers you

## 2011-04-30 NOTE — Progress Notes (Signed)
Subjective:    Patient ID: Linda Thompson, female    DOB: 06-Aug-1955, 56 y.o.   MRN: [id number]  HPI Here to discuss medication [name] to urgent care on Friday-thought she had uti (hip/ back was also bothering her)  Her urine was clear  Noted soreness in back and buttock  Was having some back soreness - no urinary symptoms   Is not really urinate freqently -no incontinenc  She was given px for vesicare   Back pain is not bad today  Had her nurse visit - disc poss of checking sugar also disc heart M (this is benign and has had for years)   Patient Active Problem List  Diagnoses  . FUNGAL DERMATITIS  . MENINGIOMA  . DIABETES MELLITUS, TYPE II  . HYPERLIPIDEMIA  . MENTAL RETARDATION, MODERATE  . GLAUCOMA NOS  . HYPERTENSION  . ALLERGIC  RHINITIS  . GERD  . HIATAL HERNIA  . DERMATITIS, SEBORRHEIC NOS  . PRURITUS ANI  . OSTEOARTHRITIS, FOOT  . HEART MURMUR  . TRANSAMINASES, SERUM, ELEVATED  . Personal history of other musculoskeletal disorders  . IRRITABLE BOWEL SYNDROME  . ANKLE PAIN, LEFT  . Hypokalemia  . Back pain  . Candidal intertrigo  . Fecal incontinence  . Worried well  . Flatulence/gas pain/belching  . Chest pain  . Abdominal pain, left lower quadrant   Past Medical History  Diagnosis Date  . Allergic rhinitis, cause unspecified   . Pain in joint, ankle and foot   . Backache, unspecified     chronic LBP with radiculopathy  . Seborrheic dermatitis, unspecified   . Type II or unspecified type diabetes mellitus without mention of complication, not stated as uncontrolled   . History of fracture of arm     Right  . History of fracture of foot   . Dermatomycosis, unspecified   . Esophageal reflux   . Unspecified glaucoma   . Undiagnosed cardiac murmurs   . Diaphragmatic hernia without mention of obstruction or gangrene   . Other and unspecified hyperlipidemia   . Unspecified essential hypertension   . Irritable bowel syndrome   . Benign neoplasm of  cerebral meninges   . Moderate intellectual disabilities   . Nausea alone   . Osteoarthrosis, unspecified whether generalized or localized, ankle and foot   . Pruritus ani   . Unspecified tinnitus   . Nonspecific elevation of levels of transaminase or lactic acid dehydrogenase (LDH)   . Leukorrhea, not specified as infective   . Obesity   . Hyperglycemia   . DDD (degenerative disc disease), lumbar    Past Surgical History  Procedure Date  . Total abdominal hysterectomy   . Cataract extraction   . Elbow surgery     right elbow   History  Substance Use Topics  . Smoking status: Never Smoker   . Smokeless tobacco: Never Used  . Alcohol Use: No   Family History  Problem Relation Age of Onset  . Diabetes Mother   . Diabetes      Aunt  . Stroke      Aunt (had pacemaker)   Allergies  Allergen Reactions  . Ace Inhibitors     REACTION: COUGH  . Augmentin Other (See Comments)    Pt unaware of reaction to augmentin  . Cyclobenzaprine Hcl     REACTION: diarrhea  . Flonase (Fluticasone Propionate)     nosebleed  . Ibuprofen     REACTION: vomiting  . BMW:[id number]+[id number] Acid+Aspartame  REACTION: diarrhea   Current Outpatient Prescriptions on File Prior to Visit  Medication Sig Dispense Refill  . aspirin 81 MG tablet Take 81 mg by mouth daily.        Marland Kitchen diltiazem (CARDIZEM CD) 240 MG 24 hr capsule Take 240 mg by mouth daily.      Marland Kitchen glycopyrrolate (ROBINUL) 2 MG tablet Take one tab 3 times a day as needed for abdominal pain  30 tablet  1  . hydrochlorothiazide (MICROZIDE) 12.5 MG capsule Take 12.5 mg by mouth daily.      . hydrocortisone 2.5 % cream Apply topically every day to affected area      . ketoconazole (NIZORAL) 2 % cream Apply 1 application topically 2 (two) times daily.      Marland Kitchen ketoconazole (NIZORAL) 2 % shampoo Apply 1 application topically 2 (two) times a week.      . latanoprost (XALATAN) 0.005 % ophthalmic solution       . loratadine  (CLARITIN) 10 MG tablet Take 10 mg by mouth daily.      . metFORMIN (GLUCOPHAGE) 500 MG tablet Take 500 mg by mouth 2 (two) times daily with a meal.      . nystatin (MYCOSTATIN) powder Apply 1 Units topically 3 (three) times daily.      Marland Kitchen omeprazole (PRILOSEC) 20 MG capsule Take 1 capsule (20 mg total) by mouth 2 (two) times daily.  60 capsule  11  . potassium chloride SA (K-DUR,KLOR-CON) 20 MEQ tablet Take 20 mEq by mouth daily.      . pravastatin (PRAVACHOL) 40 MG tablet Take 40 mg by mouth every evening.      . psyllium (METAMUCIL) 58.6 % powder Take 1 packet by mouth 3 (three) times daily.      . solifenacin (VESICARE) 5 MG tablet Take 1 tablet (5 mg total) by mouth daily.  30 tablet  1  . DISCONTD: Calcium Carbonate-Vitamin D (CALCIUM-VITAMIN D) 500-200 MG-UNIT per tablet Take 1 tablet by mouth 2 (two) times daily with a meal.             Review of Systems Review of Systems  Constitutional: Negative for fever, appetite change, fatigue and unexpected weight change.  Eyes: Negative for pain and visual disturbance.  Respiratory: Negative for cough and shortness of breath.   Cardiovascular: Negative for cp or palpitations    Gastrointestinal: Negative for nausea, diarrhea and constipation.  Genitourinary: Negative for urgency and frequency.  Skin: Negative for pallor or rash   MSK pos for back and  Neurological: Negative for weakness, light-headedness, numbness and headaches.  Hematological: Negative for adenopathy. Does not bruise/bleed easily.  Psychiatric/Behavioral: Negative for dysphoric mood. The patient is not nervous/anxious.          Objective:   Physical Exam  Constitutional: She appears well-developed and well-nourished. No distress.       Obese and well appearing with fair hygeine  HENT:  Head: Normocephalic and atraumatic.  Mouth/Throat: Oropharynx is clear and moist.  Eyes: Conjunctivae and EOM are normal. Pupils are equal, round, and reactive to light. No scleral  icterus.  Neck: Normal range of motion. Neck supple. No JVD present. Carotid bruit is not present. No thyromegaly present.  Cardiovascular: Normal rate and regular rhythm.   Murmur heard. Pulmonary/Chest: Effort normal and breath sounds normal. No respiratory distress. She has no wheezes.  Abdominal: Soft. Bowel sounds are normal. She exhibits no mass.       No suprapubic tenderness  Musculoskeletal: She exhibits tenderness. She exhibits no edema.       LS nl rom today Neg SLR Nl rom hips Mild lower LS tenderness and R buttock/ SI tenderness No scoliosis noted Nl gait   Lymphadenopathy:    She has no cervical adenopathy.  Neurological: She is alert. She has normal strength and normal reflexes. No cranial nerve deficit or sensory deficit. She exhibits normal muscle tone. Coordination normal.  Skin: Skin is warm and dry. No pallor.  Psychiatric: She has a normal mood and affect.       Baseline MR - repeats phrases often          Assessment & Plan:

## 2011-04-30 NOTE — Assessment & Plan Note (Signed)
Has chronic intermittent low back pain / deg disc (injections in past from Dr Ophelia Charter )  Today's exam is re assuring - pt states symptoms not bad now - does not want to return to ortho yet I explained her back pain is not from uti but is muscular (we disc/s/s/ of uti in detail) She does not need vesicare at this time -I inst her not to fill

## 2011-05-02 LAB — HM MAMMOGRAPHY: HM Mammogram: NEGATIVE

## 2011-05-05 ENCOUNTER — Telehealth: Payer: Self-pay | Admitting: Family Medicine

## 2011-05-05 ENCOUNTER — Encounter: Payer: Self-pay | Admitting: Family Medicine

## 2011-05-05 ENCOUNTER — Ambulatory Visit (INDEPENDENT_AMBULATORY_CARE_PROVIDER_SITE_OTHER): Payer: Medicare Other | Admitting: Family Medicine

## 2011-05-05 VITALS — BP 132/80 | HR 96 | Temp 98.7°F | Ht 62.0 in | Wt 196.8 lb

## 2011-05-05 DIAGNOSIS — Z79899 Other long term (current) drug therapy: Secondary | ICD-10-CM | POA: Diagnosis not present

## 2011-05-05 DIAGNOSIS — E669 Obesity, unspecified: Secondary | ICD-10-CM

## 2011-05-05 DIAGNOSIS — E119 Type 2 diabetes mellitus without complications: Secondary | ICD-10-CM | POA: Diagnosis not present

## 2011-05-05 LAB — MICROALBUMIN / CREATININE URINE RATIO
Creatinine,U: 116.4 mg/dL
Microalb Creat Ratio: 1.5 mg/g (ref 0.0–30.0)
Microalb, Ur: 1.8 mg/dL (ref 0.0–1.9)

## 2011-05-05 LAB — GLUCOSE, RANDOM: Glucose, Bld: 110 mg/dL — ABNORMAL HIGH (ref 70–99)

## 2011-05-05 NOTE — Progress Notes (Signed)
Subjective:    Patient ID: Linda Thompson, female    DOB: 1955-12-19, 56 y.o.   MRN: [id number]  HPI Here for questions about blood sugar  She bought a cake with stawberries on it and icing / whipped cream  Told at store that this did not have " much sugar in it" Ate it last night - and this am - had to urinate more often  She usually does not eat sweets   Lab Results  Component Value Date   HGBA1C 6.3 02/04/2011   quite stable with metformin  [facility] team visited her - said she should check sugars  (she is MR - disagree with this ) DM diet - is fair - is mentally impaired - does have trouble with understanding - does stay with diet drinks  Exercise -has been walking around the block or around the house - about 15 minutes - at leisurely pace  Symptoms- had one epidode of frequency only after eating , no thirst No problems with medications -- takes her metformin  Renal protection-none is ace allergic  Last eye exam - up to date   Is obese Aware she needs to loose weight Is not very motivated In addition does not really grasp how to do it   Patient Active Problem List  Diagnoses  . FUNGAL DERMATITIS  . MENINGIOMA  . DIABETES MELLITUS, TYPE II  . HYPERLIPIDEMIA  . MENTAL RETARDATION, MODERATE  . GLAUCOMA NOS  . HYPERTENSION  . ALLERGIC  RHINITIS  . GERD  . HIATAL HERNIA  . DERMATITIS, SEBORRHEIC NOS  . PRURITUS ANI  . OSTEOARTHRITIS, FOOT  . HEART MURMUR  . TRANSAMINASES, SERUM, ELEVATED  . Personal history of other musculoskeletal disorders  . IRRITABLE BOWEL SYNDROME  . Hypokalemia  . Back pain  . Candidal intertrigo  . Fecal incontinence  . Worried well  . Flatulence/gas pain/belching  . Abdominal pain, left lower quadrant  . Obesity   Past Medical History  Diagnosis Date  . Allergic rhinitis, cause unspecified   . Pain in joint, ankle and foot   . Backache, unspecified     chronic LBP with radiculopathy  . Seborrheic dermatitis,  unspecified   . Type II or unspecified type diabetes mellitus without mention of complication, not stated as uncontrolled   . History of fracture of arm     Right  . History of fracture of foot   . Dermatomycosis, unspecified   . Esophageal reflux   . Unspecified glaucoma   . Undiagnosed cardiac murmurs   . Diaphragmatic hernia without mention of obstruction or gangrene   . Other and unspecified hyperlipidemia   . Unspecified essential hypertension   . Irritable bowel syndrome   . Benign neoplasm of cerebral meninges   . Moderate intellectual disabilities   . Nausea alone   . Osteoarthrosis, unspecified whether generalized or localized, ankle and foot   . Pruritus ani   . Unspecified tinnitus   . Nonspecific elevation of levels of transaminase or lactic acid dehydrogenase (LDH)   . Leukorrhea, not specified as infective   . Obesity   . Hyperglycemia   . DDD (degenerative disc disease), lumbar    Past Surgical History  Procedure Date  . Total abdominal hysterectomy   . Cataract extraction   . Elbow surgery     right elbow   History  Substance Use Topics  . Smoking status: Never Smoker   . Smokeless tobacco: Never Used  . Alcohol Use: No  Family History  Problem Relation Age of Onset  . Diabetes Mother   . Diabetes      Aunt  . Stroke      Aunt (had pacemaker)   Allergies  Allergen Reactions  . Ace Inhibitors     REACTION: COUGH  . Augmentin Other (See Comments)    Pt unaware of reaction to augmentin  . Cyclobenzaprine Hcl     REACTION: diarrhea  . Flonase (Fluticasone Propionate)     nosebleed  . Ibuprofen     REACTION: vomiting  . ZOX:[id number]+XBJYNWGNF+AOZHYQMVHQ Acid+Aspartame     REACTION: diarrhea   Current Outpatient Prescriptions on File Prior to Visit  Medication Sig Dispense Refill  . aspirin 81 MG tablet Take 81 mg by mouth daily.        Marland Kitchen diltiazem (CARDIZEM CD) 240 MG 24 hr capsule Take 240 mg by mouth daily.      Marland Kitchen glycopyrrolate  (ROBINUL) 2 MG tablet Take one tab 3 times a day as needed for abdominal pain  30 tablet  1  . hydrochlorothiazide (MICROZIDE) 12.5 MG capsule Take 12.5 mg by mouth daily.      . hydrocortisone 2.5 % cream Apply topically every day to affected area      . ketoconazole (NIZORAL) 2 % cream Apply 1 application topically 2 (two) times daily.      Marland Kitchen ketoconazole (NIZORAL) 2 % shampoo Apply 1 application topically 2 (two) times a week.      . latanoprost (XALATAN) 0.005 % ophthalmic solution       . loratadine (CLARITIN) 10 MG tablet Take 10 mg by mouth daily.      . metFORMIN (GLUCOPHAGE) 500 MG tablet Take 500 mg by mouth 2 (two) times daily with a meal.      . nystatin (MYCOSTATIN) powder Apply 1 Units topically 3 (three) times daily.      Marland Kitchen omeprazole (PRILOSEC) 20 MG capsule Take 1 capsule (20 mg total) by mouth 2 (two) times daily.  60 capsule  11  . potassium chloride SA (K-DUR,KLOR-CON) 20 MEQ tablet Take 20 mEq by mouth daily.      . pravastatin (PRAVACHOL) 40 MG tablet Take 40 mg by mouth every evening.      . psyllium (METAMUCIL) 58.6 % powder Take 1 packet by mouth 3 (three) times daily.      . solifenacin (VESICARE) 5 MG tablet Take 1 tablet (5 mg total) by mouth daily.  30 tablet  1  . DISCONTD: Calcium Carbonate-Vitamin D (CALCIUM-VITAMIN D) 500-200 MG-UNIT per tablet Take 1 tablet by mouth 2 (two) times daily with a meal.              Review of Systems Review of Systems  Constitutional: Negative for fever, appetite change, fatigue and unexpected weight change.  Eyes: Negative for pain and visual disturbance.  Respiratory: Negative for cough and shortness of breath.   Cardiovascular: Negative for cp or palpitations    Gastrointestinal: Negative for nausea, diarrhea and constipation.  Genitourinary: Negative for urgency and frequency. (except this am frequency times one) Skin: Negative for pallor or rash   Neurological: Negative for weakness, light-headedness, numbness and  headaches.  Hematological: Negative for adenopathy. Does not bruise/bleed easily.  Psychiatric/Behavioral: Negative for dysphoric mood. The patient is not nervous/anxious.          Objective:   Physical Exam  Constitutional: She appears well-developed and well-nourished. No distress.       Obese and well appearing  HENT:  Head: Normocephalic and atraumatic.  Mouth/Throat: Oropharynx is clear and moist.  Eyes: Conjunctivae and EOM are normal. Pupils are equal, round, and reactive to light. No scleral icterus.  Neck: Normal range of motion. Neck supple. No JVD present. Carotid bruit is not present. No thyromegaly present.  Cardiovascular: Normal rate, regular rhythm, normal heart sounds and intact distal pulses.  Exam reveals no gallop.   Pulmonary/Chest: Effort normal and breath sounds normal. No respiratory distress. She has no wheezes.  Abdominal: Soft. Bowel sounds are normal. She exhibits no distension, no abdominal bruit and no mass. There is no tenderness.  Musculoskeletal: She exhibits no edema and no tenderness.  Lymphadenopathy:    She has no cervical adenopathy.  Neurological: She is alert. She has normal reflexes. Coordination normal.  Skin: Skin is warm and dry. No rash noted. No erythema. No pallor.  Psychiatric: She has a normal mood and affect.       Baseline MR - repeats questions and statements frequently          Assessment & Plan:

## 2011-05-05 NOTE — Assessment & Plan Note (Signed)
Discussed how this problem influences overall health and the risks it imposes  Reviewed plan for weight loss with lower calorie diet (via better food choices and also portion control or program like weight watchers) and exercise building up to or more than 30 minutes 5 days per week including some aerobic activity    

## 2011-05-05 NOTE — Assessment & Plan Note (Addendum)
Reviewed diet today- re emphasized imp of avoiding simple sugars most of the time (sweets and also starches) Info given >25 min spent with face to face with patient, >50% counseling and/or coordinating care   Has gone to DM ed in past one on one --difficult in terms of MR  a1c today-was well controlled in past  Nl eye exam  microalb today (cannot have ace or arb)

## 2011-05-05 NOTE — Patient Instructions (Addendum)
Labs today for sugar I am not ready for you to start checking your own sugar at home yet However I do want you to work on low sugar diet and exercise Try to increase walking to 30 minutes a day  Avoid any kind of sweets (even "sugar free" )- but diet soda is ok  Also do not eat too much bread/ rice/ pasta/ fruit Eat lots of green vegetables  Also lean chicken/ fish - protein

## 2011-05-05 NOTE — Telephone Encounter (Signed)
Seen today. 

## 2011-05-05 NOTE — Telephone Encounter (Signed)
Triage Record Num: [id number] Operator: Edgar Frisk Patient Name: Jakelin Taussig Call Date & Time: 05/05/2011 2:48:30AM Patient Phone: [phone] PCP: Audrie Gallus. Tower Patient Gender: Female PCP Fax : Patient DOB: 1955-07-09 Practice Name: [facility] Reason for Call: Caller: Letetia/Patient; PCP: [name]; CB#: [phone]; Call regarding Diabetes Question; Pt reports she woke this am and feels dizzy when walking this am. No other symptoms. Care advice Dizziness Guideline SS or call back reviewed Protocol(s) Used: Dizziness or Vertigo Recommended Outcome per Protocol: See Provider within 24 hours Reason for Outcome: Previously evaluated and worsening symptoms interfering with ability to carry out activities of daily living (ADLs) Care Advice: ~ Call provider immediately if have difficulty walking, vision problems, or weakness. ~ List, or take, all current prescription(s), nonprescription or alternative medication(s) to provider for evaluation. 05/05/2011 2:53:14AM Page 1 of 1 [email or web address]

## 2011-05-07 DIAGNOSIS — Z1231 Encounter for screening mammogram for malignant neoplasm of breast: Secondary | ICD-10-CM | POA: Diagnosis not present

## 2011-05-08 ENCOUNTER — Encounter: Payer: Self-pay | Admitting: Family Medicine

## 2011-05-09 ENCOUNTER — Encounter: Payer: Self-pay | Admitting: Family Medicine

## 2011-05-12 DIAGNOSIS — M5126 Other intervertebral disc displacement, lumbar region: Secondary | ICD-10-CM | POA: Diagnosis not present

## 2011-05-13 DIAGNOSIS — M545 Low back pain: Secondary | ICD-10-CM | POA: Diagnosis not present

## 2011-05-15 DIAGNOSIS — M545 Low back pain: Secondary | ICD-10-CM | POA: Diagnosis not present

## 2011-05-19 ENCOUNTER — Other Ambulatory Visit: Payer: Self-pay | Admitting: Family Medicine

## 2011-05-19 NOTE — Telephone Encounter (Signed)
Will refill electronically  

## 2011-05-20 DIAGNOSIS — M545 Low back pain: Secondary | ICD-10-CM | POA: Diagnosis not present

## 2011-05-21 ENCOUNTER — Ambulatory Visit (INDEPENDENT_AMBULATORY_CARE_PROVIDER_SITE_OTHER): Payer: Medicare Other | Admitting: Family Medicine

## 2011-05-21 ENCOUNTER — Encounter: Payer: Self-pay | Admitting: Family Medicine

## 2011-05-21 VITALS — BP 140/90 | HR 98 | Temp 97.4°F | Ht 62.0 in | Wt 196.8 lb

## 2011-05-21 DIAGNOSIS — R82998 Other abnormal findings in urine: Secondary | ICD-10-CM

## 2011-05-21 DIAGNOSIS — R350 Frequency of micturition: Secondary | ICD-10-CM

## 2011-05-21 DIAGNOSIS — M549 Dorsalgia, unspecified: Secondary | ICD-10-CM

## 2011-05-21 DIAGNOSIS — R8290 Unspecified abnormal findings in urine: Secondary | ICD-10-CM

## 2011-05-21 LAB — POCT URINALYSIS DIPSTICK
Blood, UA: NEGATIVE
Glucose, UA: NEGATIVE
Nitrite, UA: NEGATIVE
Protein, UA: NEGATIVE
Urobilinogen, UA: 0.2
pH, UA: 6

## 2011-05-21 LAB — POCT UA - MICROSCOPIC ONLY

## 2011-05-21 NOTE — Assessment & Plan Note (Signed)
Ongoing with radiculopathy  Seeing ortho at Dr Ophelia Charter office/ on gabapentin and going to PT  Will keep [place] updated

## 2011-05-21 NOTE — Patient Instructions (Signed)
Your urine looks clear today but I'm still going to check a urine culture Keep drinking lots of water  If symptoms worsen- let me know (or if you develop burning of urination or blood in urine) We will call you when urine culture returns

## 2011-05-21 NOTE — Assessment & Plan Note (Signed)
With some odor to urine but neg ua  Will send for cx and update  Disc good water intake Update if not starting to improve in a week or if worsening   Does have overactive bladder

## 2011-05-21 NOTE — Progress Notes (Signed)
Subjective:    Patient ID: Linda Thompson, female    DOB: 10-11-1955, 56 y.o.   MRN: [id number]  HPI Here for frequent urination  ua today has a few leukocytes in it - does have some odor to it  Micro is totally clear  No burning with urination  Is on vesicare  No blood in urine  No fever or nausea or vomiting   Her L hip is still bothering me - made appt with Dr Ophelia Charter (he is out on med leave )  - saw another doctor there  Put her on new med - gabapentin , for her problem She has already had 2 shots  Now is in physical therapy-and she thinks that helps      Patient Active Problem List  Diagnoses  . FUNGAL DERMATITIS  . MENINGIOMA  . DIABETES MELLITUS, TYPE II  . HYPERLIPIDEMIA  . MENTAL RETARDATION, MODERATE  . GLAUCOMA NOS  . HYPERTENSION  . ALLERGIC  RHINITIS  . GERD  . HIATAL HERNIA  . DERMATITIS, SEBORRHEIC NOS  . PRURITUS ANI  . OSTEOARTHRITIS, FOOT  . HEART MURMUR  . TRANSAMINASES, SERUM, ELEVATED  . Personal history of other musculoskeletal disorders  . IRRITABLE BOWEL SYNDROME  . Hypokalemia  . Back pain  . Candidal intertrigo  . Fecal incontinence  . Worried well  . Flatulence/gas pain/belching  . Abdominal pain, left lower quadrant  . Obesity   Past Medical History  Diagnosis Date  . Allergic rhinitis, cause unspecified   . Pain in joint, ankle and foot   . Backache, unspecified     chronic LBP with radiculopathy  . Seborrheic dermatitis, unspecified   . Type II or unspecified type diabetes mellitus without mention of complication, not stated as uncontrolled   . History of fracture of arm     Right  . History of fracture of foot   . Dermatomycosis, unspecified   . Esophageal reflux   . Unspecified glaucoma   . Undiagnosed cardiac murmurs   . Diaphragmatic hernia without mention of obstruction or gangrene   . Other and unspecified hyperlipidemia   . Unspecified essential hypertension   . Irritable bowel syndrome   . Benign neoplasm of  cerebral meninges   . Moderate intellectual disabilities   . Nausea alone   . Osteoarthrosis, unspecified whether generalized or localized, ankle and foot   . Pruritus ani   . Unspecified tinnitus   . Nonspecific elevation of levels of transaminase or lactic acid dehydrogenase (LDH)   . Leukorrhea, not specified as infective   . Obesity   . Hyperglycemia   . DDD (degenerative disc disease), lumbar    Past Surgical History  Procedure Date  . Total abdominal hysterectomy   . Cataract extraction   . Elbow surgery     right elbow   History  Substance Use Topics  . Smoking status: Never Smoker   . Smokeless tobacco: Never Used  . Alcohol Use: No   Family History  Problem Relation Age of Onset  . Diabetes Mother   . Diabetes      Aunt  . Stroke      Aunt (had pacemaker)   Allergies  Allergen Reactions  . Ace Inhibitors     REACTION: COUGH  . Amoxicillin-Pot Clavulanate     REACTION: diarrhea  . Amoxicillin-Pot Clavulanate Other (See Comments)    Pt unaware of reaction to augmentin  . Cyclobenzaprine Hcl     REACTION: diarrhea  . Flonase (Fluticasone  Propionate)     nosebleed  . Ibuprofen     REACTION: vomiting   Current Outpatient Prescriptions on File Prior to Visit  Medication Sig Dispense Refill  . aspirin 81 MG tablet Take 81 mg by mouth daily.        Marland Kitchen diltiazem (CARDIZEM CD) 240 MG 24 hr capsule Take 240 mg by mouth daily.      Marland Kitchen gabapentin (NEURONTIN) 100 MG capsule Take one capsule by mouth at bedtime for one week then one twice a day for one week then one capsule three times a day      . glycopyrrolate (ROBINUL) 2 MG tablet Take one tab 3 times a day as needed for abdominal pain  30 tablet  1  . hydrochlorothiazide (MICROZIDE) 12.5 MG capsule Take 12.5 mg by mouth daily.      . hydrocortisone 2.5 % cream Apply topically every day to affected area      . ketoconazole (NIZORAL) 2 % cream Apply 1 application topically 2 (two) times daily.      Marland Kitchen ketoconazole  (NIZORAL) 2 % shampoo Apply 1 application topically 2 (two) times a week.      . latanoprost (XALATAN) 0.005 % ophthalmic solution       . loratadine (CLARITIN) 10 MG tablet Take 10 mg by mouth daily.      . metFORMIN (GLUCOPHAGE) 500 MG tablet Take 500 mg by mouth 2 (two) times daily with a meal.      . nystatin (MYCOSTATIN) powder Apply 1 Units topically 3 (three) times daily.      Marland Kitchen nystatin (NYSTOP) 100000 UNIT/GM POWD APPLY TO AFFECTED AREA 3 TIMES DAILY  30 g  3  . omeprazole (PRILOSEC) 20 MG capsule Take 1 capsule (20 mg total) by mouth 2 (two) times daily.  60 capsule  11  . potassium chloride SA (K-DUR,KLOR-CON) 20 MEQ tablet Take 20 mEq by mouth daily.      . pravastatin (PRAVACHOL) 40 MG tablet Take 40 mg by mouth every evening.      . psyllium (METAMUCIL) 58.6 % powder Take 1 packet by mouth 3 (three) times daily.      . solifenacin (VESICARE) 5 MG tablet Take 1 tablet (5 mg total) by mouth daily.  30 tablet  1  . DISCONTD: Calcium Carbonate-Vitamin D (CALCIUM-VITAMIN D) 500-200 MG-UNIT per tablet Take 1 tablet by mouth 2 (two) times daily with a meal.             Review of Systems Review of Systems  Constitutional: Negative for fever, appetite change, fatigue and unexpected weight change.  Eyes: Negative for pain and visual disturbance.  Respiratory: Negative for cough and shortness of breath.   Cardiovascular: Negative for cp or palpitations    Gastrointestinal: Negative for nausea, diarrhea and constipation.  Genitourinary: pos  for urgency and frequency. neg for dysuria or blood  Skin: Negative for pallor or rash   MSK pos for chronic back pain rad to leg  Neurological: Negative for weakness, light-headedness, numbness and headaches.  Hematological: Negative for adenopathy. Does not bruise/bleed easily.  Psychiatric/Behavioral: Negative for dysphoric mood. The patient is not nervous/anxious.          Objective:   Physical Exam  Constitutional: She appears  well-developed and well-nourished. No distress.       Obese and well appearing - fair hygeine  HENT:  Head: Normocephalic and atraumatic.  Mouth/Throat: Oropharynx is clear and moist.  Eyes: Conjunctivae and EOM are normal.  Pupils are equal, round, and reactive to light. No scleral icterus.  Neck: Normal range of motion. Neck supple. No JVD present. Carotid bruit is not present. No thyromegaly present.  Cardiovascular: Normal rate, regular rhythm, normal heart sounds and intact distal pulses.  Exam reveals no gallop.   Pulmonary/Chest: Effort normal and breath sounds normal. No respiratory distress. She has no wheezes.  Abdominal: Soft. Bowel sounds are normal. She exhibits no distension, no abdominal bruit and no mass. There is no tenderness.       No suprapubic tenderness    Musculoskeletal:       No cva tenderness   Limited rom LS but no bony tenderness  Lymphadenopathy:    She has no cervical adenopathy.  Neurological: She is alert.  Skin: Skin is warm and dry. No rash noted. No erythema. No pallor.  Psychiatric: She has a normal mood and affect.          Assessment & Plan:

## 2011-05-23 LAB — URINE CULTURE: Colony Count: 75000

## 2011-05-27 DIAGNOSIS — M545 Low back pain: Secondary | ICD-10-CM | POA: Diagnosis not present

## 2011-05-28 ENCOUNTER — Ambulatory Visit (INDEPENDENT_AMBULATORY_CARE_PROVIDER_SITE_OTHER): Payer: Medicare Other | Admitting: Family Medicine

## 2011-05-28 ENCOUNTER — Telehealth: Payer: Self-pay | Admitting: Family Medicine

## 2011-05-28 ENCOUNTER — Encounter: Payer: Self-pay | Admitting: Family Medicine

## 2011-05-28 VITALS — BP 120/80 | HR 87 | Temp 98.8°F | Ht 62.0 in | Wt 195.0 lb

## 2011-05-28 DIAGNOSIS — L290 Pruritus ani: Secondary | ICD-10-CM

## 2011-05-28 NOTE — Telephone Encounter (Signed)
Patient has an appt with Dr. Milinda Antis this morning.

## 2011-05-28 NOTE — Telephone Encounter (Signed)
Please tell her she can use her ketoconazole cream for that once to twice daily , and keep area as dry as possible

## 2011-05-28 NOTE — Progress Notes (Signed)
Subjective:    Patient ID: Linda Thompson, female    DOB: 10-22-1955, 56 y.o.   MRN: [id number]  HPI Anal itching is back - started last week  Does keep baby wipes on hand and tries to keep clean (with aloe)   No diarrhea or constipation lately  All itching is on the outside  Has not tried her ketoconazole cream or nystatin powder   No abd pain  No bleeding  Feels fine   Patient Active Problem List  Diagnoses  . FUNGAL DERMATITIS  . MENINGIOMA  . DIABETES MELLITUS, TYPE II  . HYPERLIPIDEMIA  . MENTAL RETARDATION, MODERATE  . GLAUCOMA NOS  . HYPERTENSION  . ALLERGIC  RHINITIS  . GERD  . HIATAL HERNIA  . DERMATITIS, SEBORRHEIC NOS  . PRURITUS ANI  . OSTEOARTHRITIS, FOOT  . HEART MURMUR  . TRANSAMINASES, SERUM, ELEVATED  . Personal history of other musculoskeletal disorders  . IRRITABLE BOWEL SYNDROME  . Hypokalemia  . Back pain  . Candidal intertrigo  . Fecal incontinence  . Worried well  . Flatulence/gas pain/belching  . Abdominal pain, left lower quadrant  . Obesity  . Frequent urination  . Abnormal urine odor   Past Medical History  Diagnosis Date  . Allergic rhinitis, cause unspecified   . Pain in joint, ankle and foot   . Backache, unspecified     chronic LBP with radiculopathy  . Seborrheic dermatitis, unspecified   . Type II or unspecified type diabetes mellitus without mention of complication, not stated as uncontrolled   . History of fracture of arm     Right  . History of fracture of foot   . Dermatomycosis, unspecified   . Esophageal reflux   . Unspecified glaucoma   . Undiagnosed cardiac murmurs   . Diaphragmatic hernia without mention of obstruction or gangrene   . Other and unspecified hyperlipidemia   . Unspecified essential hypertension   . Irritable bowel syndrome   . Benign neoplasm of cerebral meninges   . Moderate intellectual disabilities   . Nausea alone   . Osteoarthrosis, unspecified whether generalized or localized, ankle  and foot   . Pruritus ani   . Unspecified tinnitus   . Nonspecific elevation of levels of transaminase or lactic acid dehydrogenase (LDH)   . Leukorrhea, not specified as infective   . Obesity   . Hyperglycemia   . DDD (degenerative disc disease), lumbar    Past Surgical History  Procedure Date  . Total abdominal hysterectomy   . Cataract extraction   . Elbow surgery     right elbow   History  Substance Use Topics  . Smoking status: Never Smoker   . Smokeless tobacco: Never Used  . Alcohol Use: No   Family History  Problem Relation Age of Onset  . Diabetes Mother   . Diabetes      Aunt  . Stroke      Aunt (had pacemaker)   Allergies  Allergen Reactions  . Ace Inhibitors     REACTION: COUGH  . Amoxicillin-Pot Clavulanate     REACTION: diarrhea  . Amoxicillin-Pot Clavulanate Other (See Comments)    Pt unaware of reaction to augmentin  . Cyclobenzaprine Hcl     REACTION: diarrhea  . Flonase (Fluticasone Propionate)     nosebleed  . Ibuprofen     REACTION: vomiting   Current Outpatient Prescriptions on File Prior to Visit  Medication Sig Dispense Refill  . aspirin 81 MG tablet Take 81 mg  by mouth daily.        Marland Kitchen diltiazem (CARDIZEM CD) 240 MG 24 hr capsule Take 240 mg by mouth daily.      Marland Kitchen gabapentin (NEURONTIN) 100 MG capsule Take one capsule by mouth at bedtime for one week then one twice a day for one week then one capsule three times a day      . glycopyrrolate (ROBINUL) 2 MG tablet Take one tab 3 times a day as needed for abdominal pain  30 tablet  1  . hydrochlorothiazide (MICROZIDE) 12.5 MG capsule Take 12.5 mg by mouth daily.      . hydrocortisone 2.5 % cream Apply topically every day to affected area      . ketoconazole (NIZORAL) 2 % cream Apply 1 application topically 2 (two) times daily.      Marland Kitchen ketoconazole (NIZORAL) 2 % shampoo Apply 1 application topically 2 (two) times a week.      . latanoprost (XALATAN) 0.005 % ophthalmic solution       .  loratadine (CLARITIN) 10 MG tablet Take 10 mg by mouth daily.      . metFORMIN (GLUCOPHAGE) 500 MG tablet Take 500 mg by mouth 2 (two) times daily with a meal.      . nystatin (MYCOSTATIN) powder Apply 1 Units topically 3 (three) times daily.      Marland Kitchen nystatin (NYSTOP) 100000 UNIT/GM POWD APPLY TO AFFECTED AREA 3 TIMES DAILY  30 g  3  . omeprazole (PRILOSEC) 20 MG capsule Take 1 capsule (20 mg total) by mouth 2 (two) times daily.  60 capsule  11  . potassium chloride SA (K-DUR,KLOR-CON) 20 MEQ tablet Take 20 mEq by mouth daily.      . pravastatin (PRAVACHOL) 40 MG tablet Take 40 mg by mouth every evening.      . psyllium (METAMUCIL) 58.6 % powder Take 1 packet by mouth 3 (three) times daily.      . solifenacin (VESICARE) 5 MG tablet Take 1 tablet (5 mg total) by mouth daily.  30 tablet  1  . DISCONTD: Calcium Carbonate-Vitamin D (CALCIUM-VITAMIN D) 500-200 MG-UNIT per tablet Take 1 tablet by mouth 2 (two) times daily with a meal.               Review of Systems Review of Systems  Constitutional: Negative for fever, appetite change, fatigue and unexpected weight change.  Eyes: Negative for pain and visual disturbance.  Respiratory: Negative for cough and shortness of breath.   Cardiovascular: Negative for cp or palpitations    Gastrointestinal: Negative for nausea, diarrhea and constipation.  Genitourinary: Negative for urgency and frequency.  Skin: Negative for pallor or rash  pos for itching in anal area only Neurological: Negative for weakness, light-headedness, numbness and headaches.  Hematological: Negative for adenopathy. Does not bruise/bleed easily.  Psychiatric/Behavioral: Negative for dysphoric mood. The patient is not nervous/anxious.          Objective:   Physical Exam  Constitutional: She appears well-developed and well-nourished. No distress.       Obese and well appearing- hygiene is fair   Eyes: Conjunctivae and EOM are normal. Pupils are equal, round, and reactive  to light. Right eye exhibits no discharge. Left eye exhibits no discharge.  Neck: Normal range of motion. Neck supple.  Cardiovascular: Normal rate and regular rhythm.   Pulmonary/Chest: Effort normal and breath sounds normal.  Abdominal: Soft. Bowel sounds are normal. She exhibits no distension. There is no tenderness.  No suprapubic tenderness    Genitourinary: Rectal exam shows no external hemorrhoid and no fissure.       Mild erythema around anal area - no satellite lesions No excoriations or skin breakdown   Neurological: She is alert.  Skin: Skin is warm and dry. There is erythema. No pallor.  Psychiatric:       Baseline MR  Repeats herself           Assessment & Plan:

## 2011-05-28 NOTE — Telephone Encounter (Signed)
Triage Record Num: [id number] Operator: Patriciaann Clan Patient Name: Linda Thompson Call Date & Time: 05/27/2011 5:55:06PM Patient Phone: [phone] PCP: Audrie Gallus. Tower Patient Gender: Female PCP Fax : Patient DOB: 11/01/1955 Practice Name: [facility] Reason for Call: Caller: Lashawnta/Patient; PCP: [name]; CB#: [phone]; Patient calling. States seh was advised to use unscented baby wipes with Aloe to cleanse rectal area. Patient states she developed occasional rectal itching. Onset X 1 week. Denies rectal drainage. Afebrile. Triage per Rectal Symptoms Protocol. No emergent sx identified. Care advice given per guidelines. Patient advised tepid bath with baking soda or Aveeno powder. Advised loose fitting clothing. Call back parameters reviewed. Patient verbalizes understanding. Protocol(s) Used: Rectal Symptoms Recommended Outcome per Protocol: See Provider within 2 Weeks Reason for Outcome: Rectal itching or burning not responding to home care Care Advice: ~ Call provider if symptoms worsen or new symptoms develop. ~ Decrease or eliminate intake of spicy foods as well as citrus, caffeinated or alcoholic beverages. ~ SYMPTOM / CONDITION MANAGEMENT ~ Do not scratch or rub affected area to avoid further irritation. Gently clean rectal area after bowel movement using unscented moist flushable wipes, moistened toilet tissue, or cotton balls soaked in witch hazel rather than dry, perfumed or colored toilet tissue. Avoid use of any wipes containing alcohol. ~ ~ Avoid bubble baths, genital deodorants, and perfumed or deodorant soaps. 05/27/2011 6:16:50PM Page 1 of 1 [id number]

## 2011-05-28 NOTE — Patient Instructions (Addendum)
For anal itching - use baby wipes to cleanse after bowel movements Then dry the area very very well  Then use the ketoconazole cream twice daily  Update if not starting to improve in a week or if worsening

## 2011-05-28 NOTE — Assessment & Plan Note (Signed)
Worse in past week  emph impt of drying well after cleansing area (always after bms) Then use her ketoconazole cream (if not imp will try the nystatin powder) She will call if worse or not improved Hygiene is not optimal due to MR -this tends to cause problems  Re assuring exam

## 2011-05-29 DIAGNOSIS — H10409 Unspecified chronic conjunctivitis, unspecified eye: Secondary | ICD-10-CM | POA: Diagnosis not present

## 2011-05-29 DIAGNOSIS — H1045 Other chronic allergic conjunctivitis: Secondary | ICD-10-CM | POA: Diagnosis not present

## 2011-05-29 DIAGNOSIS — H04129 Dry eye syndrome of unspecified lacrimal gland: Secondary | ICD-10-CM | POA: Diagnosis not present

## 2011-06-03 DIAGNOSIS — M545 Low back pain: Secondary | ICD-10-CM | POA: Diagnosis not present

## 2011-06-10 ENCOUNTER — Ambulatory Visit (INDEPENDENT_AMBULATORY_CARE_PROVIDER_SITE_OTHER): Payer: Medicare Other | Admitting: Family Medicine

## 2011-06-10 ENCOUNTER — Encounter: Payer: Self-pay | Admitting: Family Medicine

## 2011-06-10 VITALS — BP 110/72 | HR 64 | Temp 98.8°F | Ht 62.0 in | Wt 195.2 lb

## 2011-06-10 DIAGNOSIS — R5383 Other fatigue: Secondary | ICD-10-CM | POA: Diagnosis not present

## 2011-06-10 DIAGNOSIS — K589 Irritable bowel syndrome without diarrhea: Secondary | ICD-10-CM

## 2011-06-10 DIAGNOSIS — R5381 Other malaise: Secondary | ICD-10-CM | POA: Diagnosis not present

## 2011-06-10 LAB — CBC WITH DIFFERENTIAL/PLATELET
Basophils Absolute: 0 10*3/uL (ref 0.0–0.1)
Eosinophils Relative: 1.2 % (ref 0.0–5.0)
HCT: 39.4 % (ref 36.0–46.0)
Hemoglobin: 12.5 g/dL (ref 12.0–15.0)
Lymphocytes Relative: 22.8 % (ref 12.0–46.0)
Lymphs Abs: 2.1 10*3/uL (ref 0.7–4.0)
Monocytes Relative: 7.4 % (ref 3.0–12.0)
Neutro Abs: 6.2 10*3/uL (ref 1.4–7.7)
Platelets: 377 10*3/uL (ref 150.0–400.0)
RDW: 15.6 % — ABNORMAL HIGH (ref 11.5–14.6)
WBC: 9 10*3/uL (ref 4.5–10.5)

## 2011-06-10 NOTE — Assessment & Plan Note (Signed)
Worse this am after a bout of diarrhea - (after eating salad which she is not used to) No other specific symptoms  Lab today  Reassuring exam  Update if not starting to improve in a week or if worsening

## 2011-06-10 NOTE — Assessment & Plan Note (Signed)
Had 1 episode of loose stool after eating salad  Told her to adv her vegetables slowly - getting used to them Feeling better now Nl exam Reassured

## 2011-06-10 NOTE — Patient Instructions (Signed)
Make sure you are drinking enough fluids Eat a healthy diet  If you feel more fatigued or worse/ let me know  Diarrhea may be due to the salad you are not used to having - so take it slow  Labs today and we will update you with results

## 2011-06-10 NOTE — Progress Notes (Signed)
Subjective:    Patient ID: Linda Thompson, female    DOB: [birth year], 56 y.o.   MRN: [id number]  HPI Is here for symptoms of fatigue and weakness   Had a bm this am -- and then had urgent bm again  Felt a bit weak with that  Ate her breakfast and took her medicine Ran errands  Feels like she may coming down with something -but no uri or uti symptoms  One extra bm - fine since   Ate some salad - that is new for her   No blood in her stool No abd pain   Now is feeling all right -just a little bit tired  Takes her time in general to avoid falls   No focal muscle weakness or numbness or headache  Appetite is good  Patient Active Problem List  Diagnoses  . FUNGAL DERMATITIS  . MENINGIOMA  . DIABETES MELLITUS, TYPE II  . HYPERLIPIDEMIA  . MENTAL RETARDATION, MODERATE  . GLAUCOMA NOS  . HYPERTENSION  . ALLERGIC  RHINITIS  . GERD  . HIATAL HERNIA  . DERMATITIS, SEBORRHEIC NOS  . PRURITUS ANI  . OSTEOARTHRITIS, FOOT  . HEART MURMUR  . TRANSAMINASES, SERUM, ELEVATED  . Personal history of other musculoskeletal disorders  . IRRITABLE BOWEL SYNDROME  . Hypokalemia  . Back pain  . Candidal intertrigo  . Fecal incontinence  . Worried well  . Flatulence/gas pain/belching  . Abdominal pain, left lower quadrant  . Obesity  . Frequent urination  . Abnormal urine odor  . Fatigue   Past Medical History  Diagnosis Date  . Allergic rhinitis, cause unspecified   . Pain in joint, ankle and foot   . Backache, unspecified     chronic LBP with radiculopathy  . Seborrheic dermatitis, unspecified   . Type II or unspecified type diabetes mellitus without mention of complication, not stated as uncontrolled   . History of fracture of arm     Right  . History of fracture of foot   . Dermatomycosis, unspecified   . Esophageal reflux   . Unspecified glaucoma   . Undiagnosed cardiac murmurs   . Diaphragmatic hernia without mention of obstruction or gangrene   . Other and  unspecified hyperlipidemia   . Unspecified essential hypertension   . Irritable bowel syndrome   . Benign neoplasm of cerebral meninges   . Moderate intellectual disabilities   . Nausea alone   . Osteoarthrosis, unspecified whether generalized or localized, ankle and foot   . Pruritus ani   . Unspecified tinnitus   . Nonspecific elevation of levels of transaminase or lactic acid dehydrogenase (LDH)   . Leukorrhea, not specified as infective   . Obesity   . Hyperglycemia   . DDD (degenerative disc disease), lumbar    Past Surgical History  Procedure Date  . Total abdominal hysterectomy   . Cataract extraction   . Elbow surgery     right elbow   History  Substance Use Topics  . Smoking status: Never Smoker   . Smokeless tobacco: Never Used  . Alcohol Use: No   Family History  Problem Relation Age of Onset  . Diabetes Mother   . Diabetes      Aunt  . Stroke      Aunt (had pacemaker)   Allergies  Allergen Reactions  . Ace Inhibitors     REACTION: COUGH  . Amoxicillin-Pot Clavulanate     REACTION: diarrhea  . Amoxicillin-Pot Clavulanate Other (See Comments)  Pt unaware of reaction to augmentin  . Cyclobenzaprine Hcl     REACTION: diarrhea  . Flonase (Fluticasone Propionate)     nosebleed  . Ibuprofen     REACTION: vomiting   Current Outpatient Prescriptions on File Prior to Visit  Medication Sig Dispense Refill  . aspirin 81 MG tablet Take 81 mg by mouth daily.        Marland Kitchen diltiazem (CARDIZEM CD) 240 MG 24 hr capsule Take 240 mg by mouth daily.      Marland Kitchen gabapentin (NEURONTIN) 100 MG capsule Take one capsule by mouth at bedtime for one week then one twice a day for one week then one capsule three times a day      . glycopyrrolate (ROBINUL) 2 MG tablet Take one tab 3 times a day as needed for abdominal pain  30 tablet  1  . hydrochlorothiazide (MICROZIDE) 12.5 MG capsule Take 12.5 mg by mouth daily.      . hydrocortisone 2.5 % cream Apply topically every day to  affected area      . ketoconazole (NIZORAL) 2 % cream Apply 1 application topically 2 (two) times daily.      Marland Kitchen ketoconazole (NIZORAL) 2 % shampoo Apply 1 application topically 2 (two) times a week.      . latanoprost (XALATAN) 0.005 % ophthalmic solution       . loratadine (CLARITIN) 10 MG tablet Take 10 mg by mouth daily.      . metFORMIN (GLUCOPHAGE) 500 MG tablet Take 500 mg by mouth 2 (two) times daily with a meal.      . nystatin (MYCOSTATIN) powder Apply 1 Units topically 3 (three) times daily.      Marland Kitchen nystatin (NYSTOP) 100000 UNIT/GM POWD APPLY TO AFFECTED AREA 3 TIMES DAILY  30 g  3  . omeprazole (PRILOSEC) 20 MG capsule Take 1 capsule (20 mg total) by mouth 2 (two) times daily.  60 capsule  11  . potassium chloride SA (K-DUR,KLOR-CON) 20 MEQ tablet Take 20 mEq by mouth daily.      . pravastatin (PRAVACHOL) 40 MG tablet Take 40 mg by mouth every evening.      . psyllium (METAMUCIL) 58.6 % powder Take 1 packet by mouth 3 (three) times daily.      . solifenacin (VESICARE) 5 MG tablet Take 1 tablet (5 mg total) by mouth daily.  30 tablet  1  . DISCONTD: Calcium Carbonate-Vitamin D (CALCIUM-VITAMIN D) 500-200 MG-UNIT per tablet Take 1 tablet by mouth 2 (two) times daily with a meal.            Review of Systems Review of Systems  Constitutional: Negative for fever, appetite change, and unexpected weight change. pos for fatigue/ gen weakness that is improved now  Eyes: Negative for pain and visual disturbance.  Respiratory: Negative for cough and shortness of breath.   Cardiovascular: Negative for cp or palpitations    Gastrointestinal: Negative for nausea,  and constipation. neg for blood in stool or abd pain  Genitourinary: Negative for urgency and frequency.  Skin: Negative for pallor or rash   Neurological: Negative for weakness, light-headedness, numbness and headaches.  Hematological: Negative for adenopathy. Does not bruise/bleed easily.  Psychiatric/Behavioral: Negative for  dysphoric mood. The patient is not nervous/anxious.         Objective:   Physical Exam  Constitutional: She appears well-developed and well-nourished. No distress.       Obese and well appearing   HENT:  Head: Normocephalic  and atraumatic.  Mouth/Throat: Oropharynx is clear and moist.  Eyes: Conjunctivae and EOM are normal. Pupils are equal, round, and reactive to light.  Neck: Normal range of motion. Neck supple. No thyromegaly present.  Cardiovascular: Normal rate, regular rhythm, normal heart sounds and intact distal pulses.  Exam reveals no gallop.   Pulmonary/Chest: Effort normal and breath sounds normal. No respiratory distress. She has no wheezes.  Abdominal: Soft. Bowel sounds are normal. She exhibits no distension and no mass. There is no tenderness. There is no rebound and no guarding.  Musculoskeletal: Normal range of motion. She exhibits no edema and no tenderness.  Lymphadenopathy:    She has no cervical adenopathy.  Neurological: She is alert. She has normal reflexes. No cranial nerve deficit. She exhibits normal muscle tone. Coordination normal.  Skin: Skin is warm and dry. No rash noted. No erythema. No pallor.  Psychiatric: She has a normal mood and affect.       Baseline MR - repeats herself frequently           Assessment & Plan:

## 2011-06-11 ENCOUNTER — Telehealth: Payer: Self-pay

## 2011-06-11 LAB — COMPREHENSIVE METABOLIC PANEL
Alkaline Phosphatase: 114 U/L (ref 39–117)
BUN: 12 mg/dL (ref 6–23)
CO2: 26 mEq/L (ref 19–32)
GFR: 83.41 mL/min (ref >60.00)
Glucose, Bld: 108 mg/dL — ABNORMAL HIGH (ref 70–99)
Total Bilirubin: 0.3 mg/dL (ref 0.3–1.2)

## 2011-06-11 LAB — TSH: TSH: 1.93 u[IU]/mL (ref 0.35–5.50)

## 2011-06-11 NOTE — Telephone Encounter (Signed)
Patient advised as instructed via telephone, she will call tomorrow with an update.

## 2011-06-11 NOTE — Telephone Encounter (Signed)
Pt seen 06/10/11 diarrhea no better. Pt stopped salads; has had 1 large loose stool this AM after eating. Middle of lower back pain on and off for awhile(pt could not give start date of pain). Pain started again this AM. No UTI symptoms, no N&V, abdominal pain or fever.Pain level now is 5. [facility].Please advise.

## 2011-06-11 NOTE — Telephone Encounter (Signed)
Thanks for the update- tell her to stop eating salads for a while - keep up a good fluid intake and update me if fever or worse back pain, any abdominal pain or blood in stool  Ask her to call and update [place] with how she is doing tomororw

## 2011-06-12 NOTE — Telephone Encounter (Signed)
I'm glad she is feeling better- she should steer clear of salads unless very small, sounds like she does not tolerate them well

## 2011-06-12 NOTE — Telephone Encounter (Signed)
Pt walked in with update;Mid back pain better, slight soreness today.Normal BM today; no diarrhea, no blood in stool,no abdominal pain or fever.call pt with response.

## 2011-06-13 NOTE — Telephone Encounter (Signed)
Patient advised as instructed via telephone. 

## 2011-06-14 ENCOUNTER — Emergency Department (INDEPENDENT_AMBULATORY_CARE_PROVIDER_SITE_OTHER)
Admission: EM | Admit: 2011-06-14 | Discharge: 2011-06-14 | Disposition: A | Discharge status: Home / Self Care | Payer: Medicare Other | Source: Home / Self Care | Attending: Emergency Medicine | Admitting: Emergency Medicine

## 2011-06-14 ENCOUNTER — Encounter (HOSPITAL_COMMUNITY): Payer: Self-pay

## 2011-06-14 DIAGNOSIS — N390 Urinary tract infection, site not specified: Secondary | ICD-10-CM

## 2011-06-14 DIAGNOSIS — M461 Sacroiliitis, not elsewhere classified: Secondary | ICD-10-CM

## 2011-06-14 LAB — POCT URINALYSIS DIP (DEVICE)
Glucose, UA: NEGATIVE mg/dL
Specific Gravity, Urine: 1.025 (ref 1.005–1.030)
Urobilinogen, UA: 0.2 mg/dL (ref 0.0–1.0)

## 2011-06-14 MED ORDER — DICLOFENAC SODIUM 1 % TD GEL: 1.0000 "application " | Freq: Four times a day (QID) | TRANSDERMAL | Status: DC

## 2011-06-14 MED ORDER — NITROFURANTOIN MONOHYD MACRO 100 MG PO CAPS: 100.0000 mg | ORAL_CAPSULE | Freq: Two times a day (BID) | ORAL | Status: AC

## 2011-06-14 NOTE — ED Provider Notes (Signed)
History     CSN: [id number]  Arrival date & time 06/14/11  1404   First MD Initiated Contact with Patient 06/14/11 1508      Chief Complaint  Patient presents with  . Back Pain    (Consider location/radiation/quality/duration/timing/severity/associated sxs/prior treatment) HPI Comments: Patient presents today with dull, achy, nonradiating pain in her SI joint starting a "sometime last week". It is worse with bending forward, standing for prolonged periods of time, and when sitting down abruptly. No alleviating factors. She's not tried anything for this. No recent trauma, change in physical activity, bruising. No nausea, vomiting, abdominal pain, diarrhea, constipation, weakness, numbness. No blood in her stool. States she's had similar pain in the past. She was evaluated here for a similar presentation on 04/25/2011 for back pain and frequency.  patient has a history of chronic low back pain with radiculopathy, diabetes, lumbar DDD. She was thought to have an overactive bladder and degenerative disc disease. Does not remember if she was sent home with medication for her back pain. Patient states she got better in the interim, but she's been doing physical therapy. She has been prescribed Neurontin in the past for this, but she is not taking this as written. States it helps slightly. Is taking this on an as-needed basis, because she was told to do so. She does not remember who told her to do this.  Patient also reports intermittent oderous urine during this time. No urgency, frequency, dysuria, hematuria, flank pain, abdominal pain. No vaginal bleeding. She is concerned that she could have a urinary tract infection. She is taking increased fluids she was instructed.   Ortho:  Dr. Ophelia Charter' office Patient is a 56 y.o. female presenting with back pain. The history is provided by the patient and medical records. No language interpreter was used.  Back Pain  This is a recurrent problem. The current  episode started more than 2 days ago. The problem occurs constantly. The problem has not changed since onset.The pain is associated with no known injury. The pain is present in the sacro-iliac joint. The quality of the pain is described as aching. The pain does not radiate. The symptoms are aggravated by bending and twisting. The pain is worse during the day. Pertinent negatives include no chest pain, no fever, no numbness, no abdominal pain, no abdominal swelling, no bowel incontinence, no perianal numbness, no bladder incontinence, no dysuria, no pelvic pain, no leg pain, no paresthesias, no paresis, no tingling and no weakness. She has tried nothing for the symptoms. The treatment provided no relief. Risk factors include obesity.    Past Medical History  Diagnosis Date  . Allergic rhinitis, cause unspecified   . Pain in joint, ankle and foot   . Backache, unspecified     chronic LBP with radiculopathy  . Seborrheic dermatitis, unspecified   . Type II or unspecified type diabetes mellitus without mention of complication, not stated as uncontrolled   . History of fracture of arm     Right  . History of fracture of foot   . Dermatomycosis, unspecified   . Esophageal reflux   . Unspecified glaucoma   . Undiagnosed cardiac murmurs   . Diaphragmatic hernia without mention of obstruction or gangrene   . Other and unspecified hyperlipidemia   . Unspecified essential hypertension   . Irritable bowel syndrome   . Benign neoplasm of cerebral meninges   . Moderate intellectual disabilities   . Nausea alone   . Osteoarthrosis, unspecified whether generalized  or localized, ankle and foot   . Pruritus ani   . Unspecified tinnitus   . Nonspecific elevation of levels of transaminase or lactic acid dehydrogenase (LDH)   . Leukorrhea, not specified as infective   . Obesity   . Hyperglycemia   . DDD (degenerative disc disease), lumbar     Past Surgical History  Procedure Date  . Total abdominal  hysterectomy   . Cataract extraction   . Elbow surgery     right elbow    Family History  Problem Relation Age of Onset  . Diabetes Mother   . Diabetes      Aunt  . Stroke      Aunt (had pacemaker)    History  Substance Use Topics  . Smoking status: Never Smoker   . Smokeless tobacco: Never Used  . Alcohol Use: No    OB History    Grav Para Term Preterm Abortions TAB SAB Ect Mult Living                  Review of Systems  Constitutional: Negative for fever.  Cardiovascular: Negative for chest pain.  Gastrointestinal: Negative for abdominal pain and bowel incontinence.  Genitourinary: Negative for bladder incontinence, dysuria and pelvic pain.  Musculoskeletal: Positive for back pain.  Neurological: Negative for tingling, weakness, numbness and paresthesias.    Allergies  Ace inhibitors; Amoxicillin-pot clavulanate; Amoxicillin-pot clavulanate; Cyclobenzaprine hcl; Flonase; and Ibuprofen  Home Medications   Current Outpatient Rx  Name Route Sig Dispense Refill  . GABAPENTIN 100 MG PO CAPS  Take one capsule by mouth at bedtime for one week then one twice a day for one week then one capsule three times a day    . ASPIRIN 81 MG PO TABS Oral Take 81 mg by mouth daily.      Marland Kitchen DICLOFENAC SODIUM 1 % TD GEL Topical Apply 1 application topically 4 (four) times daily. 100 g 0  . DILTIAZEM HCL ER COATED BEADS 240 MG PO CP24 Oral Take 240 mg by mouth daily.    Marland Kitchen GLYCOPYRROLATE 2 MG PO TABS  Take one tab 3 times a day as needed for abdominal pain 30 tablet 1  . HYDROCHLOROTHIAZIDE 12.5 MG PO CAPS Oral Take 12.5 mg by mouth daily.    Marland Kitchen HYDROCORTISONE 2.5 % EX CREA  Apply topically every day to affected area    . KETOCONAZOLE 2 % EX CREA Topical Apply 1 application topically 2 (two) times daily.    Marland Kitchen KETOCONAZOLE 2 % EX SHAM Topical Apply 1 application topically 2 (two) times a week.    Marland Kitchen LATANOPROST 0.005 % OP SOLN      . LORATADINE 10 MG PO TABS Oral Take 10 mg by mouth daily.     Marland Kitchen METFORMIN HCL 500 MG PO TABS Oral Take 500 mg by mouth 2 (two) times daily with a meal.    . NITROFURANTOIN MONOHYD MACRO 100 MG PO CAPS Oral Take 1 capsule (100 mg total) by mouth 2 (two) times daily. 10 capsule 0  . NYSTATIN 100000 UNIT/GM EX POWD Topical Apply 1 Units topically 3 (three) times daily.    . NYSTATIN 100000 UNIT/GM EX POWD  APPLY TO AFFECTED AREA 3 TIMES DAILY 30 g 3  . OMEPRAZOLE 20 MG PO CPDR Oral Take 1 capsule (20 mg total) by mouth 2 (two) times daily. 60 capsule 11  . POTASSIUM CHLORIDE CRYS ER 20 MEQ PO TBCR Oral Take 20 mEq by mouth daily.    Marland Kitchen  PRAVASTATIN SODIUM 40 MG PO TABS Oral Take 40 mg by mouth every evening.    . PSYLLIUM 58.6 % PO POWD Oral Take 1 packet by mouth 3 (three) times daily.    Marland Kitchen SOLIFENACIN SUCCINATE 5 MG PO TABS Oral Take 1 tablet (5 mg total) by mouth daily. 30 tablet 1    BP 133/93  Pulse 90  Temp(Src) 98.2 F (36.8 C) (Oral)  Resp 18  SpO2 98%  Physical Exam  Nursing note and vitals reviewed. Constitutional: She is oriented to person, place, and time. She appears well-developed and well-nourished.       obese  HENT:  Head: Normocephalic and atraumatic.  Eyes: Conjunctivae and EOM are normal.  Neck: Normal range of motion.  Cardiovascular: Normal rate, regular rhythm, normal heart sounds and intact distal pulses.   Pulmonary/Chest: Effort normal and breath sounds normal.  Abdominal: Soft. Normal appearance and bowel sounds are normal. She exhibits no distension. There is no tenderness. There is no rebound, no guarding and no CVA tenderness.  Musculoskeletal: Normal range of motion. She exhibits no edema and no tenderness.       Lumbar back: She exhibits tenderness.       Back:       Bilateral lower extremities nontender without new rashes or color change, baseline ROM with intact PT pulses. No pain with PROM hips bilaterally. SLR neg bilaterally. Sensation baseline light touch bilaterally for Pt, DTR's symmetric and intact  bilaterally KJ.  Motor symmetric bilateral 5/5 hip flexion, quadriceps, hamstrings, EHL, foot dorsiflexion, foot plantarflexion, gait somewhat antalgic but without apparent new ataxia.   Neurological: She is alert and oriented to person, place, and time.  Skin: Skin is warm and dry.  Psychiatric: She has a normal mood and affect. Her behavior is normal. Judgment and thought content normal.    ED Course  Procedures (including critical care time)  Labs Reviewed  POCT URINALYSIS DIP (DEVICE) - Abnormal; Notable for the following:    Ketones, ur TRACE (*)    Hgb urine dipstick TRACE (*)    Leukocytes, UA SMALL (*) Biochemical Testing Only. Please order routine urinalysis from main lab if confirmatory testing is needed.   All other components within normal limits  URINE CULTURE   No results found.   1. SI (sacroiliac) joint inflammation   2. UTI (lower urinary tract infection)    Results for orders placed during the hospital encounter of 06/14/11  POCT URINALYSIS DIP (DEVICE)      Component Value Range   Glucose, UA NEGATIVE  NEGATIVE (mg/dL)   Bilirubin Urine NEGATIVE  NEGATIVE    Ketones, ur TRACE (*) NEGATIVE (mg/dL)   Specific Gravity, Urine 1.025  1.005 - 1.030    Hgb urine dipstick TRACE (*) NEGATIVE    pH 5.5  5.0 - 8.0    Protein, ur NEGATIVE  NEGATIVE (mg/dL)   Urobilinogen, UA 0.2  0.0 - 1.0 (mg/dL)   Nitrite NEGATIVE  NEGATIVE    Leukocytes, UA SMALL (*) NEGATIVE       MDM  Previous records extensively reviewed. As noted in history of present illness.   Today's presentation most consistent with sacroiliac arthritis/DJD. No history of trauma, no neurologic deficits, fevers, or other red flags, so we'll defer imaging at this time. Previous labs reviewed. CMP, CBC normal. Will send home with topical Voltaren, since she cannot tolerate oral NSAIDs. Adding prednisone may be beneficial, however, patient is a diabetic. Will have her discuss this with her primary  care  physician or orthopedic surgeon.  Patient reports oderous urine, has small leukocytes on udip. She is otherwise asymptomatic. Sent home with Macrobid. I am sending off today's urine specimen for culture and will cc Dr. Milinda Antis about today's visit.   Luiz Blare, MD 06/14/11 [phone]

## 2011-06-14 NOTE — Discharge Instructions (Signed)
Make sure you finish all the Macrobid, even if you feel better. I am sending your urine off for culture, to make sure that you on the right antibiotic. Make sure you give [place] a working phone number so we can contact you if needed. Use Voltaren gel as written. This is a very effective anti-inflammatory. You will need to talk to Dr. Joselyn Glassman for Dr. Ophelia Charter about further management and adjunctive therapies. Return if you've a fever of 100.4, if you pain gets worse, if you've trouble walking, if you  accidentally urinate or defecate on yourself, or any concerns.

## 2011-06-14 NOTE — ED Notes (Signed)
Pt has rt sided back pain since Wed and saw her PCP Dr Milinda Antis on Wed and [place] and had bloodwork done and was told to increase fluids and to call or return if she saw blood in stool or a fever.  No blood in stool or change in back pain.

## 2011-06-16 ENCOUNTER — Encounter (HOSPITAL_COMMUNITY): Payer: Self-pay

## 2011-06-16 ENCOUNTER — Emergency Department (INDEPENDENT_AMBULATORY_CARE_PROVIDER_SITE_OTHER)
Admission: EM | Admit: 2011-06-16 | Discharge: 2011-06-16 | Disposition: A | Discharge status: Home Health | Payer: Medicare Other | Source: Home / Self Care | Attending: Emergency Medicine | Admitting: Emergency Medicine

## 2011-06-16 DIAGNOSIS — J309 Allergic rhinitis, unspecified: Secondary | ICD-10-CM | POA: Diagnosis not present

## 2011-06-16 LAB — URINE CULTURE
Colony Count: 75000
Special Requests: NORMAL

## 2011-06-16 MED ORDER — FEXOFENADINE HCL 180 MG PO TABS: 180.0000 mg | ORAL_TABLET | Freq: Every day | ORAL | Status: DC

## 2011-06-16 MED ORDER — PREDNISONE 5 MG PO KIT: 1.0000 | PACK | Freq: Every day | ORAL | Status: DC

## 2011-06-16 NOTE — ED Provider Notes (Signed)
Chief Complaint  Patient presents with  . Nasal Congestion    History of Present Illness:   The patient is a 56 year old female who has had a two-day history of nasal congestion with white drainage, sneezing, itching of the nose, itchy and watery eyes, headache, and sinus pressure. She also has had some postnasal drip and some cough with white sputum production. She denies any sore throat or fever. She does have a history of allergies in the past. She has been trying saline which does help a little bit. She's tried steroid nasal sprays in the past but these have caused nasal bleeding.  Review of Systems:  Other than noted above, the patient denies any of the following symptoms. Systemic:  No fever, chills, sweats, fatigue, myalgias, headache, or anorexia. Eye:  No redness, itching, watering, pain or drainage. ENT:  No earache, ear congestion, nasal congestion, sneezing, nasal itching, rhinorrhea, sinus pressure, sinus pain, post nasal drip, or sore throat. Lungs:  No cough, sputum production, wheezing, shortness of breath, or chest pain. Skin:  No rash or itching.  PMFSH:  Past medical history, family history, social history, meds, and allergies were reviewed.  No history of asthma. No use of tobacco.  Physical Exam:   Vital signs:  BP 145/87  Pulse 106  Temp(Src) 98 F (36.7 C) (Oral)  Resp 18  SpO2 98% General:  Alert, in no distress. Eye:  No conjunctival injection or drainage. Lids were normal. ENT:  TMs and canals were normal, without erythema or inflammation.  Nasal mucosa was clear and uncongested with no drainage.  Mucous membranes were moist.  Pharynx was clear, without exudate or drainage.  There were no oral ulcerations or lesions. Neck:  Supple, no adenopathy, tenderness or mass. Lungs:  No respiratory distress.  Lungs were clear to auscultation, without wheezes, rales or rhonchi.  Breath sounds were clear and equal bilaterally. Heart:  Regular rhythm, without gallops, murmers  or rubs. Skin:  Clear, warm, and dry, without rash or lesions.  Assessment:  The encounter diagnosis was Allergic rhinitis.  Plan:   1.  The following meds were prescribed:   New Prescriptions   FEXOFENADINE (ALLEGRA) 180 MG TABLET    Take 1 tablet (180 mg total) by mouth daily.   PREDNISONE 5 MG KIT    Take 1 kit (5 mg total) by mouth daily after breakfast. Prednisone 5 mg 6 day dosepack.  Take as directed.   2.  The patient was instructed in symptomatic care and handouts were given. 3.  The patient was told to return if becoming worse in any way, if no better in 3 or 4 days, and given some red flag symptoms that would indicate earlier return. 4.  The patient was also instructed in allergen avoidance.  Follow up:  The patient was told to follow up with her primary care doctor, Dr. Milinda Antis in 2 weeks because of her elevated blood pressure.    Reuben Likes, MD 06/16/11 [phone]

## 2011-06-16 NOTE — ED Notes (Addendum)
Urine culture shows: Multiple bacterial morphotypes present, none predominant.  Pt adequately treated at visit with Macrobid.

## 2011-06-16 NOTE — ED Notes (Signed)
PT c/o nasal congestion.  Pt states she had a productive cough this am with white sputum.  Denies fever.  Pt states she is unable to use nasal spray due to epistaxis.

## 2011-06-16 NOTE — Discharge Instructions (Signed)
People who suffer from allergies frequently have symptoms of nasal congestion, runny nose, sneezing, itching of the nose, eyes, ears or throat, mucous in the throat, watering of the eyes and cough.  These symptoms are caused by the body's immune response to environmental allergens.  For seasonal allergies this is pollen (tree pollen in the spring, grass pollen in the summer, and weed pollen in the fall).  Year round allergy symptoms are usually caused by dust or mould.  Many people have year round symptoms which are worse seasonally. ° °For people who have seasonal allergies, pollen avoidance may help to decease symptoms.  This means keeping windows in the house down and windows in the car up.  Run your air conditioning, since this filters out many of the pollen particles.  If you have to spend a prolonged time outdoors during heavy pollen season, it might be prudent to wear a mask.  These can be purchased at any drug store.  When you come in after heavy pollen exposure, your skin, clothing and hair are covered with pollen.  Changing your clothing, taking a shower, and washing your hair may help with your pollen exposure.  Also, your bedding, pillow, and pillowcase may become contaminated with pollen, so frequent washing of your bedding and pillowcase and changing out your pillow may help as well.  (Your pillow can also be a source of dust and mould exposure as well.)  Showering at bedtime may also help. ° °During heavy pollen season (April and September), a large amount of pollen gets trapped in your nasal cavity.  This can contribute to ongoing allergy symptoms.  Saline irrigation of the nasal cavity can help to remove this and relieve allergy symptoms.  This can be accomplished in several ways.  You can mix up your own saline solution using the following recipe:  8 oz of distilled or boiled water, 1/2 tsp of table salt (sodium choride), and a pinch of baking soda (sodium bicarbonate).  If nasal congestion is a  problem.  1 to 2 drops of Afrin solution can be added to this as well.  To do the irrigation, purchase a nasal bulb syringe (the kind you would use to clean out an infant's nose).  Fill this up with the solution, lean you head over a sink with the nostril to be irrigated turned upward, insert the syringe into your nostril, making a tight seal, and gently irrigate, compressing the bulb.  The solution will flow into your nostril and out the other, some may also come out of your mouth.  Repeat this on both sides.  You can do this once daily.  Do not store the solution, mix it up fresh each day.  A commercial solution, called Neomed Solution, can be purchased over the counter without prescription.  You can also use a Netti Pot for irrigation.  These can be purchased at your drug store as well.  Be sure to use distilled or boiled water in these as well and make sure the Netti pot is completely dry between uses. ° °Over the counter medications can be helpful, and in many cases can completely control allergy symptoms without resorting to more expensive prescription meds.   °Antihistamines are the mainstay of allergy treatment.  The newer non-sedating antihistamines are all available over the counter.  These include Allegra, Zyrtec, and Claritin which also can be purchased in their generic forms: fexofenadine, cetirizine, and  Cetirizine.  Combining these meds with a decongestant such as pseudoephedrine or   phenylephrine helps with nasal congestion, but decongestants can also cause elevations in blood pressure.  Pseudoephedrine tends to be more effective than phenylephrine.  The older, more sedating antihistamines such as chlorpheniramine, brompheniramine, and diphenhydramine are also very effective, sometimes more so than the newer antihistamines, but with the price of more sedation.  You should be careful about driving or operating heavy machinery when taking sedating antihistamines, and men with enlarged prostates may  experience urinary retention with diphenhydramine.  Naslacrom nasal spray can be very effective for allergy symptoms.  It is available over the counter and has very few side effects.  The dosage is 2 sprays in each nostril twice daily.  It is recommended that you pinch your nose shut for 30 seconds after using it since it is a watery spray and can run out.  It can be used as long as needed.  There is no risk of dependency.  For people with year round allergies, dust, mould, insect emanations, and pet dander are usually the culprits.  To avoid dust, you need to avoid dust mites which are the main source of allergens in house dust.  Cover your bedding with moisture and mite impervious covers.  These can be purchased at any mattress store.  The modern covers are a little expensive, but not at all uncomfortable. Keeping your house as dry as possible will also help to control dust mites.  Do not use a humidifier and it may help to use a dehumidifier.  Use of a HEPA filter air filter is also a great way to reduce dust and mold exposure.  These units can be purchased commercially.  Make sure to buy one large enough for the room you intend to use it.  Change the filter as per the manufacturer's instructions.  Also, using a HEPA filter vacuum for your carpets is helpful.  There are chemicals that you can sprinkle on your carpet called acaricides that will kill dist mites.  The most commonly used brand is Acarosan.  This can be purchased on line.  It does have to be periodically reapplied.  Wash you pillows and bedsheets regularly in hot water.   See Dr. Lucretia Roers in 2 weeks to follow up on elevated blood pressure.

## 2011-06-18 ENCOUNTER — Ambulatory Visit (INDEPENDENT_AMBULATORY_CARE_PROVIDER_SITE_OTHER): Payer: Medicare Other | Admitting: Family Medicine

## 2011-06-18 ENCOUNTER — Encounter: Payer: Self-pay | Admitting: Family Medicine

## 2011-06-18 VITALS — BP 140/100 | HR 63 | Temp 98.7°F | Ht 62.0 in | Wt 194.5 lb

## 2011-06-18 DIAGNOSIS — N390 Urinary tract infection, site not specified: Secondary | ICD-10-CM | POA: Insufficient documentation

## 2011-06-18 DIAGNOSIS — M545 Low back pain: Secondary | ICD-10-CM | POA: Diagnosis not present

## 2011-06-18 DIAGNOSIS — IMO0002 Reserved for concepts with insufficient information to code with codable children: Secondary | ICD-10-CM | POA: Diagnosis not present

## 2011-06-18 DIAGNOSIS — M549 Dorsalgia, unspecified: Secondary | ICD-10-CM

## 2011-06-18 DIAGNOSIS — J309 Allergic rhinitis, unspecified: Secondary | ICD-10-CM | POA: Diagnosis not present

## 2011-06-18 DIAGNOSIS — I10 Essential (primary) hypertension: Secondary | ICD-10-CM | POA: Diagnosis not present

## 2011-06-18 LAB — POCT URINALYSIS DIPSTICK
Blood, UA: NEGATIVE
Nitrite, UA: NEGATIVE
Spec Grav, UA: 1.015
Urobilinogen, UA: 0.2
pH, UA: 6

## 2011-06-18 LAB — POCT UA - MICROSCOPIC ONLY
Casts, Ur, LPF, POC: 0
RBC, urine, microscopic: 0
Yeast, UA: 0

## 2011-06-18 NOTE — Progress Notes (Signed)
Subjective:    Patient ID: Linda Thompson, female    DOB: 10-10-1955, 56 y.o.   MRN: [id number]  HPI Was seen in [facility] for uti and also allergies and acute on chronic back pain  Was tx with macrobid for mildly pos ua  No urinary symptoms now  No burning or frequency or odor  Does still have back pain in the same place  Think that is from her degenerative disc  Low back pain is on the R No numbness or weakness Put on prednisone by UC  This feels a lot better now  Also given voltaren gel   Allergies are better also  The prednisone has really helped this also   Does not have f/u with Dr Ophelia Charter -needs appt   Patient Active Problem List  Diagnoses  . FUNGAL DERMATITIS  . MENINGIOMA  . DIABETES MELLITUS, TYPE II  . HYPERLIPIDEMIA  . MENTAL RETARDATION, MODERATE  . GLAUCOMA NOS  . HYPERTENSION  . ALLERGIC  RHINITIS  . GERD  . HIATAL HERNIA  . DERMATITIS, SEBORRHEIC NOS  . PRURITUS ANI  . OSTEOARTHRITIS, FOOT  . HEART MURMUR  . TRANSAMINASES, SERUM, ELEVATED  . Personal history of other musculoskeletal disorders  . IRRITABLE BOWEL SYNDROME  . Hypokalemia  . Back pain  . Candidal intertrigo  . Fecal incontinence  . Worried well  . Flatulence/gas pain/belching  . Abdominal pain, left lower quadrant  . Obesity  . Frequent urination  . Abnormal urine odor  . Fatigue  . UTI (lower urinary tract infection)   Past Medical History  Diagnosis Date  . Allergic rhinitis, cause unspecified   . Pain in joint, ankle and foot   . Backache, unspecified     chronic LBP with radiculopathy  . Seborrheic dermatitis, unspecified   . Type II or unspecified type diabetes mellitus without mention of complication, not stated as uncontrolled   . History of fracture of arm     Right  . History of fracture of foot   . Dermatomycosis, unspecified   . Esophageal reflux   . Unspecified glaucoma   . Undiagnosed cardiac murmurs   . Diaphragmatic hernia without mention of obstruction or  gangrene   . Other and unspecified hyperlipidemia   . Unspecified essential hypertension   . Irritable bowel syndrome   . Benign neoplasm of cerebral meninges   . Moderate intellectual disabilities   . Nausea alone   . Osteoarthrosis, unspecified whether generalized or localized, ankle and foot   . Pruritus ani   . Unspecified tinnitus   . Nonspecific elevation of levels of transaminase or lactic acid dehydrogenase (LDH)   . Leukorrhea, not specified as infective   . Obesity   . Hyperglycemia   . DDD (degenerative disc disease), lumbar    Past Surgical History  Procedure Date  . Total abdominal hysterectomy   . Cataract extraction   . Elbow surgery     right elbow   History  Substance Use Topics  . Smoking status: Never Smoker   . Smokeless tobacco: Never Used  . Alcohol Use: No   Family History  Problem Relation Age of Onset  . Diabetes Mother   . Diabetes      Aunt  . Stroke      Aunt (had pacemaker)   Allergies  Allergen Reactions  . Ace Inhibitors     REACTION: COUGH  . Amoxicillin-Pot Clavulanate     REACTION: diarrhea  . Amoxicillin-Pot Clavulanate Other (See Comments)  Pt unaware of reaction to augmentin  . Cyclobenzaprine Hcl     REACTION: diarrhea  . Flonase (Fluticasone Propionate)     nosebleed  . Ibuprofen     REACTION: vomiting   Current Outpatient Prescriptions on File Prior to Visit  Medication Sig Dispense Refill  . aspirin 81 MG tablet Take 81 mg by mouth daily.        . diclofenac sodium (VOLTAREN) 1 % GEL Apply 1 application topically 4 (four) times daily.  100 g  0  . diltiazem (CARDIZEM CD) 240 MG 24 hr capsule Take 240 mg by mouth daily.      . fexofenadine (ALLEGRA) 180 MG tablet Take 1 tablet (180 mg total) by mouth daily.  15 tablet  0  . gabapentin (NEURONTIN) 100 MG capsule Take one capsule by mouth at bedtime for one week then one twice a day for one week then one capsule three times a day      . glycopyrrolate (ROBINUL) 2 MG  tablet Take one tab 3 times a day as needed for abdominal pain  30 tablet  1  . hydrochlorothiazide (MICROZIDE) 12.5 MG capsule Take 12.5 mg by mouth daily.      . hydrocortisone 2.5 % cream Apply topically every day to affected area      . ketoconazole (NIZORAL) 2 % cream Apply 1 application topically 2 (two) times daily.      Marland Kitchen ketoconazole (NIZORAL) 2 % shampoo Apply 1 application topically 2 (two) times a week.      . latanoprost (XALATAN) 0.005 % ophthalmic solution       . loratadine (CLARITIN) 10 MG tablet Take 10 mg by mouth daily.      . metFORMIN (GLUCOPHAGE) 500 MG tablet Take 500 mg by mouth 2 (two) times daily with a meal.      . nitrofurantoin, macrocrystal-monohydrate, (MACROBID) 100 MG capsule Take 1 capsule (100 mg total) by mouth 2 (two) times daily.  10 capsule  0  . nystatin (MYCOSTATIN) powder Apply 1 Units topically 3 (three) times daily.      Marland Kitchen nystatin (NYSTOP) 100000 UNIT/GM POWD APPLY TO AFFECTED AREA 3 TIMES DAILY  30 g  3  . omeprazole (PRILOSEC) 20 MG capsule Take 1 capsule (20 mg total) by mouth 2 (two) times daily.  60 capsule  11  . potassium chloride SA (K-DUR,KLOR-CON) 20 MEQ tablet Take 20 mEq by mouth daily.      . pravastatin (PRAVACHOL) 40 MG tablet Take 40 mg by mouth every evening.      . PredniSONE 5 MG KIT Take 1 kit (5 mg total) by mouth daily after breakfast. Prednisone 5 mg 6 day dosepack.  Take as directed.  1 kit  0  . psyllium (METAMUCIL) 58.6 % powder Take 1 packet by mouth 3 (three) times daily.      . solifenacin (VESICARE) 5 MG tablet Take 1 tablet (5 mg total) by mouth daily.  30 tablet  1  . DISCONTD: Calcium Carbonate-Vitamin D (CALCIUM-VITAMIN D) 500-200 MG-UNIT per tablet Take 1 tablet by mouth 2 (two) times daily with a meal.              Review of Systems Review of Systems  Constitutional: Negative for fever, appetite change, fatigue and unexpected weight change.  ENt neg for cong or sinus pain/ pos for sneeze / runny nose  Eyes:  Negative for pain and visual disturbance.  Respiratory: Negative for cough and shortness of breath.  neg for wheeze  Cardiovascular: Negative for cp or palpitations    Gastrointestinal: Negative for nausea, diarrhea and constipation.  Genitourinary: Negative for urgency and frequency.  Skin: Negative for pallor or rash   MSK pos for low back pain on the right  Neurological: Negative for weakness, light-headedness, numbness and headaches.  Hematological: Negative for adenopathy. Does not bruise/bleed easily.  Psychiatric/Behavioral: Negative for dysphoric mood. The patient is not nervous/anxious.         Objective:   Physical Exam  Constitutional: She appears well-developed and well-nourished. No distress.       Obese and well appearing   HENT:  Head: Normocephalic and atraumatic.  Right Ear: External ear normal.  Left Ear: External ear normal.  Mouth/Throat: Oropharynx is clear and moist. No oropharyngeal exudate.       Nares are pale and boggy No sinus tenderness  Eyes: Conjunctivae and EOM are normal. Pupils are equal, round, and reactive to light. Right eye exhibits no discharge. Left eye exhibits no discharge.  Neck: Normal range of motion. Neck supple. No thyromegaly present.  Cardiovascular: Normal rate, regular rhythm, normal heart sounds and intact distal pulses.  Exam reveals no gallop.   Pulmonary/Chest: Effort normal and breath sounds normal. No respiratory distress. She has no wheezes. She has no rales. She exhibits no tenderness.  Abdominal: Soft. Bowel sounds are normal. She exhibits no distension and no mass. There is no tenderness.       No suprapubic tenderness    Musculoskeletal: Normal range of motion. She exhibits tenderness. She exhibits no edema.       Tenderness R LS musculature and SI area Neg SLR Nl gait  Flex 90 deg with pain Nl extension  No cva tenderness   Lymphadenopathy:    She has no cervical adenopathy.  Neurological: She is alert. She has  normal reflexes.  Skin: Skin is warm and dry. No rash noted. No erythema. No pallor.  Psychiatric:       Baseline MR  Repeats herself frequently Is cheerful           Assessment & Plan:

## 2011-06-18 NOTE — Assessment & Plan Note (Signed)
Much improved with pred taper and allegra No sinus pain  Update if not starting to improve in a week or if worsening

## 2011-06-18 NOTE — Patient Instructions (Addendum)
Finish your prednisone and finish the antibiotic for uti  Take the allergy medicine as needed daily  We will refer you to Dr Ophelia Charter at check out - take a neighbor or friend with you

## 2011-06-18 NOTE — Assessment & Plan Note (Signed)
Clear ua today cx grew contaminants only  Adv to finish macrobid No further urine sympt Disc ways to avoid utis Disc water intake

## 2011-06-18 NOTE — Assessment & Plan Note (Signed)
This continues to come and go with hx of deg spine/ disc dz  Imp with prednisone and also using voltaren gel  Made f/u with Dr Ophelia Charter and told pt to take someone with her

## 2011-06-18 NOTE — Assessment & Plan Note (Signed)
bp is up today- presumably from the prednisone Disc this  Will finish it and continue to monitor - expect imp off that med

## 2011-06-20 ENCOUNTER — Encounter: Payer: Self-pay | Admitting: Family Medicine

## 2011-06-20 ENCOUNTER — Ambulatory Visit (INDEPENDENT_AMBULATORY_CARE_PROVIDER_SITE_OTHER): Payer: Medicare Other | Admitting: Family Medicine

## 2011-06-20 VITALS — BP 132/80 | HR 60 | Temp 98.7°F | Ht 62.0 in | Wt 195.8 lb

## 2011-06-20 DIAGNOSIS — N390 Urinary tract infection, site not specified: Secondary | ICD-10-CM | POA: Diagnosis not present

## 2011-06-20 LAB — POCT UA - MICROSCOPIC ONLY
Casts, Ur, LPF, POC: 0
Yeast, UA: 0

## 2011-06-20 LAB — POCT URINALYSIS DIPSTICK
Ketones, UA: NEGATIVE
Urobilinogen, UA: 0.2
pH, UA: 6

## 2011-06-20 MED ORDER — CIPROFLOXACIN HCL 250 MG PO TABS: 250.0000 mg | ORAL_TABLET | Freq: Two times a day (BID) | ORAL | Status: AC

## 2011-06-20 NOTE — Progress Notes (Signed)
Subjective:    Patient ID: Linda Thompson, female    DOB: 05-Jan-1956, 56 y.o.   MRN: [id number]  HPI Here for urinary symptoms   Today ua shows large leukocytes   Just took abx not long ago - and her ua was normal on -- finished the macrobid recently  Was also on prednisone for back pain   Now having some frequent urination - since her last visit  Some burning with urination  No blood in urine Back is not really hurting now   Bowel movement today -- loose   She did see Dr Ophelia Charter for her back pain - and it was improved She said he told her to follow up if it worsens    Patient Active Problem List  Diagnoses  . FUNGAL DERMATITIS  . MENINGIOMA  . DIABETES MELLITUS, TYPE II  . HYPERLIPIDEMIA  . MENTAL RETARDATION, MODERATE  . GLAUCOMA NOS  . HYPERTENSION  . ALLERGIC  RHINITIS  . GERD  . HIATAL HERNIA  . DERMATITIS, SEBORRHEIC NOS  . PRURITUS ANI  . OSTEOARTHRITIS, FOOT  . HEART MURMUR  . TRANSAMINASES, SERUM, ELEVATED  . Personal history of other musculoskeletal disorders  . IRRITABLE BOWEL SYNDROME  . Hypokalemia  . Back pain  . Candidal intertrigo  . Fecal incontinence  . Worried well  . Flatulence/gas pain/belching  . Obesity  . Frequent urination  . Fatigue  . UTI (lower urinary tract infection)   Past Medical History  Diagnosis Date  . Allergic rhinitis, cause unspecified   . Pain in joint, ankle and foot   . Backache, unspecified     chronic LBP with radiculopathy  . Seborrheic dermatitis, unspecified   . Type II or unspecified type diabetes mellitus without mention of complication, not stated as uncontrolled   . History of fracture of arm     Right  . History of fracture of foot   . Dermatomycosis, unspecified   . Esophageal reflux   . Unspecified glaucoma   . Undiagnosed cardiac murmurs   . Diaphragmatic hernia without mention of obstruction or gangrene   . Other and unspecified hyperlipidemia   . Unspecified essential hypertension   .  Irritable bowel syndrome   . Benign neoplasm of cerebral meninges   . Moderate intellectual disabilities   . Nausea alone   . Osteoarthrosis, unspecified whether generalized or localized, ankle and foot   . Pruritus ani   . Unspecified tinnitus   . Nonspecific elevation of levels of transaminase or lactic acid dehydrogenase (LDH)   . Leukorrhea, not specified as infective   . Obesity   . Hyperglycemia   . DDD (degenerative disc disease), lumbar    Past Surgical History  Procedure Date  . Total abdominal hysterectomy   . Cataract extraction   . Elbow surgery     right elbow   History  Substance Use Topics  . Smoking status: Never Smoker   . Smokeless tobacco: Never Used  . Alcohol Use: No   Family History  Problem Relation Age of Onset  . Diabetes Mother   . Diabetes      Aunt  . Stroke      Aunt (had pacemaker)   Allergies  Allergen Reactions  . Ace Inhibitors     REACTION: COUGH  . Amoxicillin-Pot Clavulanate     REACTION: diarrhea  . Amoxicillin-Pot Clavulanate Other (See Comments)    Pt unaware of reaction to augmentin  . Cyclobenzaprine Hcl     REACTION: diarrhea  .  Flonase (Fluticasone Propionate)     nosebleed  . Ibuprofen     REACTION: vomiting   Current Outpatient Prescriptions on File Prior to Visit  Medication Sig Dispense Refill  . aspirin 81 MG tablet Take 81 mg by mouth daily.        . diclofenac sodium (VOLTAREN) 1 % GEL Apply 1 application topically 4 (four) times daily.  100 g  0  . diltiazem (CARDIZEM CD) 240 MG 24 hr capsule Take 240 mg by mouth daily.      . fexofenadine (ALLEGRA) 180 MG tablet Take 1 tablet (180 mg total) by mouth daily.  15 tablet  0  . gabapentin (NEURONTIN) 100 MG capsule Take one capsule by mouth at bedtime for one week then one twice a day for one week then one capsule three times a day      . glycopyrrolate (ROBINUL) 2 MG tablet Take one tab 3 times a day as needed for abdominal pain  30 tablet  1  .  hydrochlorothiazide (MICROZIDE) 12.5 MG capsule Take 12.5 mg by mouth daily.      . hydrocortisone 2.5 % cream Apply topically every day to affected area      . ketoconazole (NIZORAL) 2 % cream Apply 1 application topically 2 (two) times daily.      Marland Kitchen ketoconazole (NIZORAL) 2 % shampoo Apply 1 application topically 2 (two) times a week.      . latanoprost (XALATAN) 0.005 % ophthalmic solution       . loratadine (CLARITIN) 10 MG tablet Take 10 mg by mouth daily.      . metFORMIN (GLUCOPHAGE) 500 MG tablet Take 500 mg by mouth 2 (two) times daily with a meal.      . nitrofurantoin (MACRODANTIN) 100 MG capsule Take 100 mg by mouth 2 (two) times daily.      . nitrofurantoin, macrocrystal-monohydrate, (MACROBID) 100 MG capsule Take 1 capsule (100 mg total) by mouth 2 (two) times daily.  10 capsule  0  . nystatin (MYCOSTATIN) powder Apply 1 Units topically 3 (three) times daily.      Marland Kitchen nystatin (NYSTOP) 100000 UNIT/GM POWD APPLY TO AFFECTED AREA 3 TIMES DAILY  30 g  3  . omeprazole (PRILOSEC) 20 MG capsule Take 1 capsule (20 mg total) by mouth 2 (two) times daily.  60 capsule  11  . potassium chloride SA (K-DUR,KLOR-CON) 20 MEQ tablet Take 20 mEq by mouth daily.      . pravastatin (PRAVACHOL) 40 MG tablet Take 40 mg by mouth every evening.      . PredniSONE 5 MG KIT Take 1 kit (5 mg total) by mouth daily after breakfast. Prednisone 5 mg 6 day dosepack.  Take as directed.  1 kit  0  . psyllium (METAMUCIL) 58.6 % powder Take 1 packet by mouth 3 (three) times daily.      . solifenacin (VESICARE) 5 MG tablet Take 1 tablet (5 mg total) by mouth daily.  30 tablet  1  . DISCONTD: Calcium Carbonate-Vitamin D (CALCIUM-VITAMIN D) 500-200 MG-UNIT per tablet Take 1 tablet by mouth 2 (two) times daily with a meal.              Review of Systems Review of Systems  Constitutional: Negative for fever, appetite change, fatigue and unexpected weight change.  Eyes: Negative for pain and visual disturbance.    Respiratory: Negative for cough and shortness of breath.   Cardiovascular: Negative for cp or palpitations    Gastrointestinal:  Negative for nausea, diarrhea and constipation.  Genitourinary: ps  for urgency and frequency. -also some incontinence and dysuria/ neg for back or flank pain  Skin: Negative for pallor or rash   Neurological: Negative for weakness, light-headedness, numbness and headaches.  Hematological: Negative for adenopathy. Does not bruise/bleed easily.  Psychiatric/Behavioral: Negative for dysphoric mood. The patient is not nervous/anxious.          Objective:   Physical Exam  Constitutional: She appears well-developed and well-nourished.       Obese and well appearing with fair hygeine  HENT:  Head: Normocephalic and atraumatic.  Mouth/Throat: Oropharynx is clear and moist.  Eyes: Conjunctivae and EOM are normal. Pupils are equal, round, and reactive to light. No scleral icterus.  Neck: Normal range of motion. Neck supple. No JVD present. No thyromegaly present.  Cardiovascular: Normal rate and regular rhythm.   Pulmonary/Chest: Effort normal and breath sounds normal. No respiratory distress. She has no wheezes.  Abdominal: Soft. Bowel sounds are normal. She exhibits no distension and no mass. There is no tenderness.       No suprapubic tenderness    Musculoskeletal: She exhibits no edema.       No cva tenderness   Lymphadenopathy:    She has no cervical adenopathy.  Neurological: She is alert. She has normal reflexes. No cranial nerve deficit. She exhibits normal muscle tone. Coordination normal.  Skin: Skin is warm and dry. No rash noted. No erythema.  Psychiatric: She has a normal mood and affect.       Baseline MR - repeats herself frequently          Assessment & Plan:

## 2011-06-20 NOTE — Patient Instructions (Signed)
Drink lots of water Take the cipro twice daily for 5 days for uti  I sent your urine for culture - and will update you when this returns Update if not starting to improve in a week or if worsening

## 2011-06-20 NOTE — Assessment & Plan Note (Signed)
ua is pos on dip today for leukocytes , though micro looks rather clear Cover with 5 d of low dose cipro Enc to drink water  Update if not starting to improve in a week or if worsening   Urine sent for culture also -will update

## 2011-06-22 LAB — URINE CULTURE: Colony Count: 8000

## 2011-06-25 ENCOUNTER — Telehealth: Payer: Self-pay

## 2011-06-25 NOTE — Telephone Encounter (Signed)
Pt finished antibiotic and wanted to know if OK to drink tea or coffee couple of times a week. Pt encourage to drink more water but should be ok to drink tea or coffee couple of times a week unless symptoms reoccur.

## 2011-06-27 DIAGNOSIS — L608 Other nail disorders: Secondary | ICD-10-CM | POA: Diagnosis not present

## 2011-06-27 DIAGNOSIS — E1149 Type 2 diabetes mellitus with other diabetic neurological complication: Secondary | ICD-10-CM | POA: Diagnosis not present

## 2011-07-01 ENCOUNTER — Ambulatory Visit (INDEPENDENT_AMBULATORY_CARE_PROVIDER_SITE_OTHER): Payer: Medicare Other | Admitting: Family Medicine

## 2011-07-01 ENCOUNTER — Encounter: Payer: Self-pay | Admitting: Family Medicine

## 2011-07-01 VITALS — BP 118/78 | HR 91 | Temp 97.3°F | Ht 62.25 in | Wt 195.2 lb

## 2011-07-01 DIAGNOSIS — R05 Cough: Secondary | ICD-10-CM | POA: Insufficient documentation

## 2011-07-01 DIAGNOSIS — J069 Acute upper respiratory infection, unspecified: Secondary | ICD-10-CM | POA: Diagnosis not present

## 2011-07-01 NOTE — Assessment & Plan Note (Signed)
With prod cough but reassuring exam  Disc symptomatic care - see instructions on AVS - fluids/ mucinex/ zinc losenge Update if not starting to improve in a week or if worsening  e

## 2011-07-01 NOTE — Patient Instructions (Signed)
You have a viral upper respiratory infection with cough (a cold)  Drink lots of water mucinex over the counter can help- one pill twice daily (plain mucinex) Also suck on zinc losenges (also over the counter) - about 3 times a day for the next 3 days This will get worse before it gets better Get rest  Update if not starting to improve in a week or if worsening

## 2011-07-01 NOTE — Progress Notes (Signed)
Subjective:    Patient ID: Linda Thompson, female    DOB: 1955/03/02, 56 y.o.   MRN: [id number]  HPI Here for 2 days of cough- poss coming down with a cold Thick phlem today and yesterday  White in color   No fever Is somewhat tired  No wheeze   No runny or stuffy nose No ST     Patient Active Problem List  Diagnoses  . FUNGAL DERMATITIS  . MENINGIOMA  . DIABETES MELLITUS, TYPE II  . HYPERLIPIDEMIA  . MENTAL RETARDATION, MODERATE  . GLAUCOMA NOS  . HYPERTENSION  . ALLERGIC  RHINITIS  . GERD  . HIATAL HERNIA  . DERMATITIS, SEBORRHEIC NOS  . PRURITUS ANI  . OSTEOARTHRITIS, FOOT  . HEART MURMUR  . TRANSAMINASES, SERUM, ELEVATED  . Personal history of other musculoskeletal disorders  . IRRITABLE BOWEL SYNDROME  . Hypokalemia  . Back pain  . Candidal intertrigo  . Fecal incontinence  . Worried well  . Flatulence/gas pain/belching  . Obesity  . Frequent urination  . Fatigue  . UTI (lower urinary tract infection)   Past Medical History  Diagnosis Date  . Allergic rhinitis, cause unspecified   . Pain in joint, ankle and foot   . Backache, unspecified     chronic LBP with radiculopathy  . Seborrheic dermatitis, unspecified   . Type II or unspecified type diabetes mellitus without mention of complication, not stated as uncontrolled   . History of fracture of arm     Right  . History of fracture of foot   . Dermatomycosis, unspecified   . Esophageal reflux   . Unspecified glaucoma   . Undiagnosed cardiac murmurs   . Diaphragmatic hernia without mention of obstruction or gangrene   . Other and unspecified hyperlipidemia   . Unspecified essential hypertension   . Irritable bowel syndrome   . Benign neoplasm of cerebral meninges   . Moderate intellectual disabilities   . Nausea alone   . Osteoarthrosis, unspecified whether generalized or localized, ankle and foot   . Pruritus ani   . Unspecified tinnitus   . Nonspecific elevation of levels of transaminase  or lactic acid dehydrogenase (LDH)   . Leukorrhea, not specified as infective   . Obesity   . Hyperglycemia   . DDD (degenerative disc disease), lumbar    Past Surgical History  Procedure Date  . Total abdominal hysterectomy   . Cataract extraction   . Elbow surgery     right elbow   History  Substance Use Topics  . Smoking status: Never Smoker   . Smokeless tobacco: Never Used  . Alcohol Use: No   Family History  Problem Relation Age of Onset  . Diabetes Mother   . Diabetes      Aunt  . Stroke      Aunt (had pacemaker)   Allergies  Allergen Reactions  . Ace Inhibitors     REACTION: COUGH  . Amoxicillin-Pot Clavulanate     REACTION: diarrhea  . Amoxicillin-Pot Clavulanate Other (See Comments)    Pt unaware of reaction to augmentin  . Cyclobenzaprine Hcl     REACTION: diarrhea  . Flonase (Fluticasone Propionate)     nosebleed  . Ibuprofen     REACTION: vomiting   Current Outpatient Prescriptions on File Prior to Visit  Medication Sig Dispense Refill  . aspirin 81 MG tablet Take 81 mg by mouth daily.        . diclofenac sodium (VOLTAREN) 1 % GEL  Apply 1 application topically 4 (four) times daily.  100 g  0  . diltiazem (CARDIZEM CD) 240 MG 24 hr capsule Take 240 mg by mouth daily.      . fexofenadine (ALLEGRA) 180 MG tablet Take 1 tablet (180 mg total) by mouth daily.  15 tablet  0  . gabapentin (NEURONTIN) 100 MG capsule Take one capsule by mouth at bedtime for one week then one twice a day for one week then one capsule three times a day      . glycopyrrolate (ROBINUL) 2 MG tablet Take one tab 3 times a day as needed for abdominal pain  30 tablet  1  . hydrochlorothiazide (MICROZIDE) 12.5 MG capsule Take 12.5 mg by mouth daily.      . hydrocortisone 2.5 % cream Apply topically every day to affected area      . ketoconazole (NIZORAL) 2 % cream Apply 1 application topically 2 (two) times daily.      Marland Kitchen ketoconazole (NIZORAL) 2 % shampoo Apply 1 application topically 2  (two) times a week.      . latanoprost (XALATAN) 0.005 % ophthalmic solution       . loratadine (CLARITIN) 10 MG tablet Take 10 mg by mouth daily.      . metFORMIN (GLUCOPHAGE) 500 MG tablet Take 500 mg by mouth 2 (two) times daily with a meal.      . nystatin (MYCOSTATIN) powder Apply 1 Units topically 3 (three) times daily.      Marland Kitchen nystatin (NYSTOP) 100000 UNIT/GM POWD APPLY TO AFFECTED AREA 3 TIMES DAILY  30 g  3  . omeprazole (PRILOSEC) 20 MG capsule Take 1 capsule (20 mg total) by mouth 2 (two) times daily.  60 capsule  11  . potassium chloride SA (K-DUR,KLOR-CON) 20 MEQ tablet Take 20 mEq by mouth daily.      . pravastatin (PRAVACHOL) 40 MG tablet Take 40 mg by mouth every evening.      . psyllium (METAMUCIL) 58.6 % powder Take 1 packet by mouth 3 (three) times daily.      . solifenacin (VESICARE) 5 MG tablet Take 1 tablet (5 mg total) by mouth daily.  30 tablet  1  . ciprofloxacin (CIPRO) 250 MG tablet Take 1 tablet (250 mg total) by mouth 2 (two) times daily.  10 tablet  0  . nitrofurantoin (MACRODANTIN) 100 MG capsule Take 100 mg by mouth 2 (two) times daily.      . PredniSONE 5 MG KIT Take 1 kit (5 mg total) by mouth daily after breakfast. Prednisone 5 mg 6 day dosepack.  Take as directed.  1 kit  0  . DISCONTD: Calcium Carbonate-Vitamin D (CALCIUM-VITAMIN D) 500-200 MG-UNIT per tablet Take 1 tablet by mouth 2 (two) times daily with a meal.            Review of Systems Review of Systems  Constitutional: Negative for fever, appetite change, fatigue and unexpected weight change.  Eyes: Negative for pain and visual disturbance.  ENT neg for nasal sympt or st or sinus pain  Respiratory: Negative for wheeze or sob or chest soreness .   Cardiovascular: Negative for cp or palpitations    Gastrointestinal: Negative for nausea, diarrhea and constipation.  Genitourinary: Negative for urgency and frequency.  Skin: Negative for pallor or rash   Neurological: Negative for weakness,  light-headedness, numbness and headaches.  Hematological: Negative for adenopathy. Does not bruise/bleed easily.  Psychiatric/Behavioral: Negative for dysphoric mood. The patient is not  nervous/anxious.         Objective:   Physical Exam  Constitutional: She appears well-developed and well-nourished. No distress.  HENT:  Head: Normocephalic and atraumatic.  Right Ear: External ear normal.  Left Ear: External ear normal.  Nose: Nose normal.  Mouth/Throat: Oropharynx is clear and moist.       Nares are boggy No sinus tenderness  Eyes: Conjunctivae and EOM are normal. Pupils are equal, round, and reactive to light. Right eye exhibits no discharge. Left eye exhibits no discharge. No scleral icterus.  Neck: Normal range of motion. Neck supple.  Cardiovascular: Normal rate and regular rhythm.   Pulmonary/Chest: Effort normal and breath sounds normal. No respiratory distress. She has no wheezes. She has no rales. She exhibits no tenderness.  Lymphadenopathy:    She has no cervical adenopathy.  Skin: Skin is warm and dry. No rash noted.  Psychiatric: She has a normal mood and affect.       Baseline MR- repeats herself frequently Pleasant           Assessment & Plan:

## 2011-07-02 ENCOUNTER — Telehealth: Payer: Self-pay

## 2011-07-02 NOTE — Telephone Encounter (Signed)
Pt saw Dr Milinda Antis 07/01/11; pt wanted to review Dr Royden Purl patient instructions. Reviewed and pt acknowledges understanding.

## 2011-07-11 ENCOUNTER — Ambulatory Visit (INDEPENDENT_AMBULATORY_CARE_PROVIDER_SITE_OTHER): Payer: Medicare Other | Admitting: Family Medicine

## 2011-07-11 ENCOUNTER — Encounter: Payer: Self-pay | Admitting: Family Medicine

## 2011-07-11 VITALS — BP 140/96 | HR 85 | Temp 97.7°F | Wt 196.8 lb

## 2011-07-11 DIAGNOSIS — R05 Cough: Secondary | ICD-10-CM

## 2011-07-11 NOTE — Patient Instructions (Signed)
Lungs sound clear today Oxygen levels are looking well today. Let's give this cough a bit more time to improve. May continue guaifenesin but take it with plenty of fluid for it to work well. Use nasal saline as your nose is somewhat dry today. Good to see you today, call us with questions.

## 2011-07-11 NOTE — Progress Notes (Signed)
  Subjective:    Patient ID: DEMECIA NORTHWAY, female    DOB: [birth year], 56 y.o.   MRN: [id number]  HPI CC: cough  Seen here 07/01/2011 with dx viral uri with cough.  Reviewed Dr. Melvia Heaps note.  Has been using zinc cold relief lozenges (homeopathic) as well as guaifenesin extended release 600mg  bid.    Yesterday was sitting on sofa and felt like couldn't catch breath for a few seconds, some tightness in chest.  Also noticing producing some mucous when coughs and clearing throat.    No fevers/chills, abd pain, n/v, diaphoresis, ear or tooth pain, ST, congestion.  bp elevated today - was in a hurry.  Endorses bp normal this morning, checks every morning.  Reports compliance with meds.  BP Readings from Last 3 Encounters:  07/11/11 145/96  07/01/11 118/78  06/20/11 132/80    Review of Systems Per HPI    Objective:   Physical Exam  Nursing note and vitals reviewed. Constitutional: She appears well-developed and well-nourished. No distress.  HENT:  Head: Normocephalic and atraumatic.  Right Ear: Hearing, tympanic membrane, external ear and ear canal normal.  Left Ear: Hearing, tympanic membrane, external ear and ear canal normal.  Nose: No mucosal edema or rhinorrhea. Right sinus exhibits no maxillary sinus tenderness and no frontal sinus tenderness. Left sinus exhibits no maxillary sinus tenderness and no frontal sinus tenderness.  Mouth/Throat: Uvula is midline, oropharynx is clear and moist and mucous membranes are normal. No oropharyngeal exudate, posterior oropharyngeal edema, posterior oropharyngeal erythema or tonsillar abscesses.       Dry nasal epithelium  Eyes: Conjunctivae and EOM are normal. Pupils are equal, round, and reactive to light. No scleral icterus.  Neck: Normal range of motion. Neck supple. No JVD present. No thyromegaly present.  Cardiovascular: Normal rate, regular rhythm, normal heart sounds and intact distal pulses.   No murmur heard. Pulmonary/Chest: Effort  normal and breath sounds normal. No respiratory distress. She has no wheezes. She has no rales.  Musculoskeletal: She exhibits no edema.  Lymphadenopathy:    She has no cervical adenopathy.  Skin: Skin is warm and dry. No rash noted.       Assessment & Plan:

## 2011-07-11 NOTE — Assessment & Plan Note (Signed)
After recent viral URTI.  Anticipate post-infectious cough. Supportive care as per instructions and as she has been doing at home. Give cough more time, reassured, should resolve on own. Discussed nasal saline use. Update if not improving.

## 2011-07-13 ENCOUNTER — Emergency Department (INDEPENDENT_AMBULATORY_CARE_PROVIDER_SITE_OTHER)
Admission: EM | Admit: 2011-07-13 | Discharge: 2011-07-13 | Disposition: A | Discharge status: Home / Self Care | Payer: Medicare Other | Source: Home / Self Care | Attending: Emergency Medicine | Admitting: Emergency Medicine

## 2011-07-13 ENCOUNTER — Encounter (HOSPITAL_COMMUNITY): Payer: Self-pay

## 2011-07-13 DIAGNOSIS — T148XXA Other injury of unspecified body region, initial encounter: Secondary | ICD-10-CM | POA: Diagnosis not present

## 2011-07-13 DIAGNOSIS — L304 Erythema intertrigo: Secondary | ICD-10-CM

## 2011-07-13 DIAGNOSIS — L538 Other specified erythematous conditions: Secondary | ICD-10-CM

## 2011-07-13 DIAGNOSIS — T148 Other injury of unspecified body region: Secondary | ICD-10-CM | POA: Diagnosis not present

## 2011-07-13 DIAGNOSIS — W57XXXA Bitten or stung by nonvenomous insect and other nonvenomous arthropods, initial encounter: Secondary | ICD-10-CM

## 2011-07-13 MED ORDER — HYDROCORTISONE 2.5 % EX CREA: TOPICAL_CREAM | Freq: Two times a day (BID) | CUTANEOUS | Status: DC

## 2011-07-13 MED ORDER — BACITRACIN-POLYMYXIN B 500-10000 UNIT/GM EX OINT: TOPICAL_OINTMENT | Freq: Two times a day (BID) | CUTANEOUS | Status: AC

## 2011-07-13 NOTE — ED Notes (Signed)
Pt has rash on back of rt leg for one week that itches.  One lesion on upper thigh and one in bend of leg.

## 2011-07-13 NOTE — ED Provider Notes (Signed)
History     CSN: [id number]  Arrival date & time 07/13/11  1103   First MD Initiated Contact with Patient 07/13/11 1107      Chief Complaint  Patient presents with  . Rash    (Consider location/radiation/quality/duration/timing/severity/associated sxs/prior treatment) HPI Comments: Patient reports itchy rash on her right lower extremity starting a week ago. It has not changed since it started. She doesn't recall any insect bites, new lotions, soaps, detergents. No known exposure to poison ivy. There are no pets in the house. She lives by herself. She does not have any contacts with similar rash. She has been scratching it without relief. No nausea, vomiting, fevers. No erythema along the rash. she also reports an itchy, erythematous, flat rash in her groin starting about 10 days ago, she states that she's had this in the past, but is not sure what she is supposed to be using for it.  ROS as noted in HPI. All other ROS negative.   Patient is a 56 y.o. female presenting with rash. The history is provided by the patient. No language interpreter was used.  Rash  This is a new problem. The current episode started more than 1 week ago. The problem has not changed since onset.The problem is associated with an unknown factor. There has been no fever. The rash is present on the right lower leg. Associated symptoms include itching. Pertinent negatives include no pain and no weeping. She has tried nothing for the symptoms. The treatment provided no relief.    Past Medical History  Diagnosis Date  . Allergic rhinitis, cause unspecified   . Pain in joint, ankle and foot   . Backache, unspecified     chronic LBP with radiculopathy  . Seborrheic dermatitis, unspecified   . Type II or unspecified type diabetes mellitus without mention of complication, not stated as uncontrolled   . History of fracture of arm     Right  . History of fracture of foot   . Dermatomycosis, unspecified   . Esophageal  reflux   . Unspecified glaucoma   . Undiagnosed cardiac murmurs   . Diaphragmatic hernia without mention of obstruction or gangrene   . Other and unspecified hyperlipidemia   . Unspecified essential hypertension   . Irritable bowel syndrome   . Benign neoplasm of cerebral meninges   . Moderate intellectual disabilities   . Nausea alone   . Osteoarthrosis, unspecified whether generalized or localized, ankle and foot   . Pruritus ani   . Unspecified tinnitus   . Nonspecific elevation of levels of transaminase or lactic acid dehydrogenase (LDH)   . Leukorrhea, not specified as infective   . Obesity   . Hyperglycemia   . DDD (degenerative disc disease), lumbar     Past Surgical History  Procedure Date  . Total abdominal hysterectomy   . Cataract extraction   . Elbow surgery     right elbow    Family History  Problem Relation Age of Onset  . Diabetes Mother   . Diabetes      Aunt  . Stroke      Aunt (had pacemaker)    History  Substance Use Topics  . Smoking status: Never Smoker   . Smokeless tobacco: Never Used  . Alcohol Use: No    OB History    Grav Para Term Preterm Abortions TAB SAB Ect Mult Living                  Review  of Systems  Skin: Positive for itching and rash.    Allergies  Ace inhibitors; Amoxicillin-pot clavulanate; Amoxicillin-pot clavulanate; Cyclobenzaprine hcl; Flonase; and Ibuprofen  Home Medications   Current Outpatient Rx  Name Route Sig Dispense Refill  . ASPIRIN 81 MG PO TABS Oral Take 81 mg by mouth daily.      Marland Kitchen BACITRACIN-POLYMYXIN B 500-10000 UNIT/GM EX OINT Topical Apply topically 2 (two) times daily. 30 g 0  . DICLOFENAC SODIUM 1 % TD GEL Topical Apply 1 application topically 4 (four) times daily. 100 g 0  . DILTIAZEM HCL ER COATED BEADS 240 MG PO CP24 Oral Take 240 mg by mouth daily.    Marland Kitchen FEXOFENADINE HCL 180 MG PO TABS Oral Take 1 tablet (180 mg total) by mouth daily. 15 tablet 0  . GABAPENTIN 100 MG PO CAPS  Take one  capsule by mouth at bedtime for one week then one twice a day for one week then one capsule three times a day    . GLYCOPYRROLATE 2 MG PO TABS  Take one tab 3 times a day as needed for abdominal pain 30 tablet 1  . HYDROCHLOROTHIAZIDE 12.5 MG PO CAPS Oral Take 12.5 mg by mouth daily.    Marland Kitchen HYDROCORTISONE 2.5 % EX CREA Topical Apply topically 2 (two) times daily. Apply topically every day to affected area 30 g 0  . KETOCONAZOLE 2 % EX CREA Topical Apply 1 application topically 2 (two) times daily.    Marland Kitchen KETOCONAZOLE 2 % EX SHAM Topical Apply 1 application topically 2 (two) times a week.    Marland Kitchen LATANOPROST 0.005 % OP SOLN      . LORATADINE 10 MG PO TABS Oral Take 10 mg by mouth daily.    Marland Kitchen METFORMIN HCL 500 MG PO TABS Oral Take 500 mg by mouth 2 (two) times daily with a meal.    . NITROFURANTOIN MACROCRYSTAL 100 MG PO CAPS Oral Take 100 mg by mouth 2 (two) times daily.    . NYSTATIN 100000 UNIT/GM EX POWD Topical Apply 1 Units topically 3 (three) times daily.    . NYSTATIN 100000 UNIT/GM EX POWD  APPLY TO AFFECTED AREA 3 TIMES DAILY 30 g 3  . OMEPRAZOLE 20 MG PO CPDR Oral Take 1 capsule (20 mg total) by mouth 2 (two) times daily. 60 capsule 11  . POTASSIUM CHLORIDE CRYS ER 20 MEQ PO TBCR Oral Take 20 mEq by mouth daily.    Marland Kitchen PRAVASTATIN SODIUM 40 MG PO TABS Oral Take 40 mg by mouth every evening.    . PSYLLIUM 58.6 % PO POWD Oral Take 1 packet by mouth 3 (three) times daily.    Marland Kitchen SOLIFENACIN SUCCINATE 5 MG PO TABS Oral Take 1 tablet (5 mg total) by mouth daily. 30 tablet 1    BP 105/71  Pulse 105  Temp 97.9 F (36.6 C) (Oral)  Resp 22  SpO2 97%  Physical Exam  Nursing note and vitals reviewed. Constitutional: She is oriented to person, place, and time. She appears well-developed and well-nourished. No distress.  HENT:  Head: Normocephalic and atraumatic.  Eyes: Conjunctivae and EOM are normal.  Neck: Normal range of motion.  Cardiovascular: Normal rate.   Pulmonary/Chest: Effort normal.    Abdominal: She exhibits no distension.         Symmetric, erythematous, macerated area is bilateral groin underneath the pannus consistent with intertrigo  Musculoskeletal: Normal range of motion.  Neurological: She is alert and oriented to person, place, and time.  Skin: Skin is warm and dry. Rash noted.           several scabbed areas, right lateral thigh, right posterior knee  with excoriations. These appear consistent with insect bites. No evidence of infection  Psychiatric: She has a normal mood and affect. Her behavior is normal. Judgment and thought content normal.    ED Course  Procedures (including critical care time)  Labs Reviewed - No data to display No results found.   1. Insect bites   2. Intertrigo       MDM  Denies patient to restart nystatin powder for the intertrigo. Will send her home with some Polysporin for the insect bites. Advised her to not pick at these while healing. Discussed signs and symptoms that should prompt return. Patient agrees with plan.  Luiz Blare, MD 07/13/11 2053

## 2011-07-13 NOTE — Discharge Instructions (Signed)
Apply nystatin powder to the rash in your groin as written. Try to keep this as dry as possible. Apply the hydrocortisone daily insect bites. The itching. Plan polymyxin help prevent infection. Do not pick up the scabs. Return if you've a fever above 100.4, if you get worse, or other concerns

## 2011-07-17 DIAGNOSIS — M545 Low back pain: Secondary | ICD-10-CM | POA: Diagnosis not present

## 2011-07-17 DIAGNOSIS — IMO0002 Reserved for concepts with insufficient information to code with codable children: Secondary | ICD-10-CM | POA: Diagnosis not present

## 2011-07-22 ENCOUNTER — Other Ambulatory Visit: Payer: Self-pay

## 2011-07-23 ENCOUNTER — Other Ambulatory Visit: Payer: Self-pay | Admitting: *Deleted

## 2011-07-23 ENCOUNTER — Encounter: Payer: Self-pay | Admitting: Family Medicine

## 2011-07-23 ENCOUNTER — Ambulatory Visit (INDEPENDENT_AMBULATORY_CARE_PROVIDER_SITE_OTHER): Payer: Medicare Other | Admitting: Family Medicine

## 2011-07-23 VITALS — BP 145/97 | HR 91 | Temp 97.4°F | Ht 62.0 in | Wt 196.0 lb

## 2011-07-23 DIAGNOSIS — B353 Tinea pedis: Secondary | ICD-10-CM

## 2011-07-23 MED ORDER — KETOCONAZOLE 2 % EX CREA: 1.0000 "application " | TOPICAL_CREAM | Freq: Every day | CUTANEOUS | Status: DC

## 2011-07-23 NOTE — Patient Instructions (Addendum)
I think the itching of your feet could be from athletes foot (fungus) and also dry skin (you may have some of this at home)  Use the ketoconazole cream on both feet once daily in the am , and then use a moisturizer over the counter at night  Dry feet well after bathing  Update me if no improvement

## 2011-07-23 NOTE — Progress Notes (Signed)
Subjective:    Patient ID: Linda Thompson, female    DOB: 23-Apr-1955, 56 y.o.   MRN: [id number]  HPI Here for c/o of toe itching   L foot having itching between her toes - she was told by the pharmacist to see me  Does not think there is any rash She is not putting anything on it  She does tend to wear socks all the time    Patient Active Problem List  Diagnosis  . FUNGAL DERMATITIS  . MENINGIOMA  . DIABETES MELLITUS, TYPE II  . HYPERLIPIDEMIA  . MENTAL RETARDATION, MODERATE  . GLAUCOMA NOS  . HYPERTENSION  . ALLERGIC  RHINITIS  . GERD  . HIATAL HERNIA  . DERMATITIS, SEBORRHEIC NOS  . PRURITUS ANI  . OSTEOARTHRITIS, FOOT  . HEART MURMUR  . TRANSAMINASES, SERUM, ELEVATED  . Personal history of other musculoskeletal disorders  . IRRITABLE BOWEL SYNDROME  . Hypokalemia  . Back pain  . Candidal intertrigo  . Fecal incontinence  . Worried well  . Flatulence/gas pain/belching  . Obesity  . Frequent urination  . Fatigue  . UTI (lower urinary tract infection)  . Cough   Past Medical History  Diagnosis Date  . Allergic rhinitis, cause unspecified   . Pain in joint, ankle and foot   . Backache, unspecified     chronic LBP with radiculopathy  . Seborrheic dermatitis, unspecified   . Type II or unspecified type diabetes mellitus without mention of complication, not stated as uncontrolled   . History of fracture of arm     Right  . History of fracture of foot   . Dermatomycosis, unspecified   . Esophageal reflux   . Unspecified glaucoma   . Undiagnosed cardiac murmurs   . Diaphragmatic hernia without mention of obstruction or gangrene   . Other and unspecified hyperlipidemia   . Unspecified essential hypertension   . Irritable bowel syndrome   . Benign neoplasm of cerebral meninges   . Moderate intellectual disabilities   . Nausea alone   . Osteoarthrosis, unspecified whether generalized or localized, ankle and foot   . Pruritus ani   . Unspecified tinnitus     . Nonspecific elevation of levels of transaminase or lactic acid dehydrogenase (LDH)   . Leukorrhea, not specified as infective   . Obesity   . Hyperglycemia   . DDD (degenerative disc disease), lumbar    Past Surgical History  Procedure Date  . Total abdominal hysterectomy   . Cataract extraction   . Elbow surgery     right elbow   History  Substance Use Topics  . Smoking status: Never Smoker   . Smokeless tobacco: Never Used  . Alcohol Use: No   Family History  Problem Relation Age of Onset  . Diabetes Mother   . Diabetes      Aunt  . Stroke      Aunt (had pacemaker)   Allergies  Allergen Reactions  . Ace Inhibitors     REACTION: COUGH  . Amoxicillin-Pot Clavulanate     REACTION: diarrhea  . Amoxicillin-Pot Clavulanate Other (See Comments)    Pt unaware of reaction to augmentin  . Cyclobenzaprine Hcl     REACTION: diarrhea  . Flonase (Fluticasone Propionate)     nosebleed  . Ibuprofen     REACTION: vomiting   Current Outpatient Prescriptions on File Prior to Visit  Medication Sig Dispense Refill  . aspirin 81 MG tablet Take 81 mg by mouth daily.        Marland Kitchen  bacitracin-polymyxin b (POLYSPORIN) ointment Apply topically 2 (two) times daily.  30 g  0  . diclofenac sodium (VOLTAREN) 1 % GEL Apply 1 application topically 4 (four) times daily.  100 g  0  . diltiazem (CARDIZEM CD) 240 MG 24 hr capsule Take 240 mg by mouth daily.      . fexofenadine (ALLEGRA) 180 MG tablet Take 1 tablet (180 mg total) by mouth daily.  15 tablet  0  . gabapentin (NEURONTIN) 100 MG capsule Take one capsule by mouth at bedtime for one week then one twice a day for one week then one capsule three times a day      . glycopyrrolate (ROBINUL) 2 MG tablet Take one tab 3 times a day as needed for abdominal pain  30 tablet  1  . hydrochlorothiazide (MICROZIDE) 12.5 MG capsule Take 12.5 mg by mouth daily.      . hydrocortisone 2.5 % cream Apply topically 2 (two) times daily. Apply topically every day  to affected area  30 g  0  . ketoconazole (NIZORAL) 2 % cream Apply 1 application topically 2 (two) times daily.      Marland Kitchen ketoconazole (NIZORAL) 2 % shampoo Apply 1 application topically 2 (two) times a week.      . latanoprost (XALATAN) 0.005 % ophthalmic solution       . loratadine (CLARITIN) 10 MG tablet Take 10 mg by mouth daily.      . metFORMIN (GLUCOPHAGE) 500 MG tablet Take 500 mg by mouth 2 (two) times daily with a meal.      . nitrofurantoin (MACRODANTIN) 100 MG capsule Take 100 mg by mouth 2 (two) times daily.      Marland Kitchen nystatin (MYCOSTATIN) powder Apply 1 Units topically 3 (three) times daily.      Marland Kitchen nystatin (NYSTOP) 100000 UNIT/GM POWD APPLY TO AFFECTED AREA 3 TIMES DAILY  30 g  3  . omeprazole (PRILOSEC) 20 MG capsule Take 1 capsule (20 mg total) by mouth 2 (two) times daily.  60 capsule  11  . potassium chloride SA (K-DUR,KLOR-CON) 20 MEQ tablet Take 20 mEq by mouth daily.      . pravastatin (PRAVACHOL) 40 MG tablet Take 40 mg by mouth every evening.      . psyllium (METAMUCIL) 58.6 % powder Take 1 packet by mouth 3 (three) times daily.      . solifenacin (VESICARE) 5 MG tablet Take 1 tablet (5 mg total) by mouth daily.  30 tablet  1  . DISCONTD: Calcium Carbonate-Vitamin D (CALCIUM-VITAMIN D) 500-200 MG-UNIT per tablet Take 1 tablet by mouth 2 (two) times daily with a meal.            Review of Systems Review of Systems  Constitutional: Negative for fever, appetite change, fatigue and unexpected weight change.  Eyes: Negative for pain and visual disturbance.  Respiratory: Negative for cough and shortness of breath.   Cardiovascular: Negative for cp or palpitations    Gastrointestinal: Negative for nausea, diarrhea and constipation.  Genitourinary: Negative for urgency and frequency.  Skin: Negative for pallor or rash  pos for itching on foot  Neurological: Negative for weakness, light-headedness, numbness and headaches.  Hematological: Negative for adenopathy. Does not  bruise/bleed easily.  Psychiatric/Behavioral: Negative for dysphoric mood. The patient is not nervous/anxious.         Objective:   Physical Exam  Constitutional: She appears well-developed and well-nourished. No distress.       overwt and well appearing  HENT:  Head: Normocephalic and atraumatic.  Eyes: Conjunctivae and EOM are normal. Pupils are equal, round, and reactive to light. Right eye exhibits no discharge. Left eye exhibits no discharge.  Cardiovascular: Normal rate and regular rhythm.   Pulmonary/Chest: Effort normal and breath sounds normal.  Musculoskeletal: She exhibits no edema and no tenderness.  Skin: Skin is warm and dry. No erythema. No pallor.       Dry skin diffusely on feet Some maceration/ cracking between all toes of L foot Fungal nails / mild thickening noted   Psychiatric:       Baseline MR- repeats herself often pleasant          Assessment & Plan:

## 2011-07-25 ENCOUNTER — Other Ambulatory Visit: Payer: Self-pay

## 2011-07-25 MED ORDER — PRAVASTATIN SODIUM 40 MG PO TABS: 40.0000 mg | ORAL_TABLET | Freq: Every evening | ORAL | Status: DC

## 2011-07-25 NOTE — Telephone Encounter (Signed)
Will refill electronically  

## 2011-07-25 NOTE — Telephone Encounter (Signed)
Ok to refill 

## 2011-07-29 ENCOUNTER — Ambulatory Visit (INDEPENDENT_AMBULATORY_CARE_PROVIDER_SITE_OTHER): Payer: Medicare Other | Admitting: Family Medicine

## 2011-07-29 ENCOUNTER — Encounter: Payer: Self-pay | Admitting: Family Medicine

## 2011-07-29 VITALS — BP 120/82 | HR 92 | Temp 98.1°F | Ht 62.0 in | Wt 192.8 lb

## 2011-07-29 DIAGNOSIS — W57XXXA Bitten or stung by nonvenomous insect and other nonvenomous arthropods, initial encounter: Secondary | ICD-10-CM | POA: Diagnosis not present

## 2011-07-29 DIAGNOSIS — T148XXA Other injury of unspecified body region, initial encounter: Secondary | ICD-10-CM | POA: Diagnosis not present

## 2011-07-29 DIAGNOSIS — T148 Other injury of unspecified body region: Secondary | ICD-10-CM

## 2011-07-29 NOTE — Patient Instructions (Addendum)
I think the spots on your legs are insect bites  The ones on right leg are getting better  Continue the poly bacitracin and hydrocortisone until all the areas clear up  Keep areas clean with antibacterial soap and water  Update if not starting to improve in a week or if worsening

## 2011-07-29 NOTE — Progress Notes (Signed)
Subjective:    Patient ID: Linda Thompson, female    DOB: 05-24-1955, 56 y.o.   MRN: [id number]  HPI Here for a leg rash Went to urgent care 2 weeks ago  Given antibitic ointment and also hydrocortisone  Unsure what her dx was Rash is getting better on R leg Now starting on the L   It itches a bit  Feels ok - no fever or other symptoms  I looked at the urgent care note- dx with insect bites  She has found no ticks Only outdoors to get mail    Patient Active Problem List  Diagnosis  . FUNGAL DERMATITIS  . MENINGIOMA  . DIABETES MELLITUS, TYPE II  . HYPERLIPIDEMIA  . MENTAL RETARDATION, MODERATE  . GLAUCOMA NOS  . HYPERTENSION  . ALLERGIC  RHINITIS  . GERD  . HIATAL HERNIA  . DERMATITIS, SEBORRHEIC NOS  . PRURITUS ANI  . OSTEOARTHRITIS, FOOT  . HEART MURMUR  . TRANSAMINASES, SERUM, ELEVATED  . Personal history of other musculoskeletal disorders  . IRRITABLE BOWEL SYNDROME  . Hypokalemia  . Back pain  . Candidal intertrigo  . Fecal incontinence  . Worried well  . Flatulence/gas pain/belching  . Obesity  . Frequent urination  . Fatigue  . UTI (lower urinary tract infection)  . Cough  . Tinea pedis   Past Medical History  Diagnosis Date  . Allergic rhinitis, cause unspecified   . Pain in joint, ankle and foot   . Backache, unspecified     chronic LBP with radiculopathy  . Seborrheic dermatitis, unspecified   . Type II or unspecified type diabetes mellitus without mention of complication, not stated as uncontrolled   . History of fracture of arm     Right  . History of fracture of foot   . Dermatomycosis, unspecified   . Esophageal reflux   . Unspecified glaucoma   . Undiagnosed cardiac murmurs   . Diaphragmatic hernia without mention of obstruction or gangrene   . Other and unspecified hyperlipidemia   . Unspecified essential hypertension   . Irritable bowel syndrome   . Benign neoplasm of cerebral meninges   . Moderate intellectual disabilities     . Nausea alone   . Osteoarthrosis, unspecified whether generalized or localized, ankle and foot   . Pruritus ani   . Unspecified tinnitus   . Nonspecific elevation of levels of transaminase or lactic acid dehydrogenase (LDH)   . Leukorrhea, not specified as infective   . Obesity   . Hyperglycemia   . DDD (degenerative disc disease), lumbar    Past Surgical History  Procedure Date  . Total abdominal hysterectomy   . Cataract extraction   . Elbow surgery     right elbow   History  Substance Use Topics  . Smoking status: Never Smoker   . Smokeless tobacco: Never Used  . Alcohol Use: No   Family History  Problem Relation Age of Onset  . Diabetes Mother   . Diabetes      Aunt  . Stroke      Aunt (had pacemaker)   Allergies  Allergen Reactions  . Ace Inhibitors     REACTION: COUGH  . Amoxicillin-Pot Clavulanate     REACTION: diarrhea  . Amoxicillin-Pot Clavulanate Other (See Comments)    Pt unaware of reaction to augmentin  . Cyclobenzaprine Hcl     REACTION: diarrhea  . Flonase (Fluticasone Propionate)     nosebleed  . Ibuprofen     REACTION:  vomiting   Current Outpatient Prescriptions on File Prior to Visit  Medication Sig Dispense Refill  . aspirin 81 MG tablet Take 81 mg by mouth daily.        . diclofenac sodium (VOLTAREN) 1 % GEL Apply 1 application topically 4 (four) times daily.  100 g  0  . diltiazem (CARDIZEM CD) 240 MG 24 hr capsule Take 240 mg by mouth daily.      . fexofenadine (ALLEGRA) 180 MG tablet Take 1 tablet (180 mg total) by mouth daily.  15 tablet  0  . gabapentin (NEURONTIN) 100 MG capsule Take one capsule by mouth at bedtime for one week then one twice a day for one week then one capsule three times a day      . glycopyrrolate (ROBINUL) 2 MG tablet Take one tab 3 times a day as needed for abdominal pain  30 tablet  1  . hydrochlorothiazide (MICROZIDE) 12.5 MG capsule Take 12.5 mg by mouth daily.      . hydrocortisone 2.5 % cream Apply  topically 2 (two) times daily. Apply topically every day to affected area  30 g  0  . ketoconazole (NIZORAL) 2 % cream Apply 1 application topically daily. To feet in am as needed  15 g  1  . ketoconazole (NIZORAL) 2 % shampoo Apply 1 application topically 2 (two) times a week.      . latanoprost (XALATAN) 0.005 % ophthalmic solution       . loratadine (CLARITIN) 10 MG tablet Take 10 mg by mouth daily.      . metFORMIN (GLUCOPHAGE) 500 MG tablet Take 500 mg by mouth 2 (two) times daily with a meal.      . nitrofurantoin (MACRODANTIN) 100 MG capsule Take 100 mg by mouth 2 (two) times daily.      Marland Kitchen nystatin (MYCOSTATIN) powder Apply 1 Units topically 3 (three) times daily.      Marland Kitchen nystatin (NYSTOP) 100000 UNIT/GM POWD APPLY TO AFFECTED AREA 3 TIMES DAILY  30 g  3  . omeprazole (PRILOSEC) 20 MG capsule Take 1 capsule (20 mg total) by mouth 2 (two) times daily.  60 capsule  11  . potassium chloride SA (K-DUR,KLOR-CON) 20 MEQ tablet Take 20 mEq by mouth daily.      . pravastatin (PRAVACHOL) 40 MG tablet Take 1 tablet (40 mg total) by mouth every evening.  30 tablet  11  . psyllium (METAMUCIL) 58.6 % powder Take 1 packet by mouth 3 (three) times daily.      . solifenacin (VESICARE) 5 MG tablet Take 1 tablet (5 mg total) by mouth daily.  30 tablet  1  . DISCONTD: Calcium Carbonate-Vitamin D (CALCIUM-VITAMIN D) 500-200 MG-UNIT per tablet Take 1 tablet by mouth 2 (two) times daily with a meal.            Review of Systems Review of Systems  Constitutional: Negative for fever, appetite change, fatigue and unexpected weight change.  Eyes: Negative for pain and visual disturbance.  Respiratory: Negative for cough and shortness of breath.   Cardiovascular: Negative for cp or palpitations    Gastrointestinal: Negative for nausea, diarrhea and constipation.  Genitourinary: Negative for urgency and frequency.  Skin: Negative for pallor and pos for red spots on legs  Neurological: Negative for weakness,  light-headedness, numbness and headaches.  Hematological: Negative for adenopathy. Does not bruise/bleed easily.  Psychiatric/Behavioral: Negative for dysphoric mood. The patient is not nervous/anxious.  Objective:   Physical Exam  Constitutional: She appears well-developed and well-nourished. No distress.       Obese and well appearing   HENT:  Head: Normocephalic and atraumatic.  Mouth/Throat: Oropharynx is clear and moist.  Eyes: Conjunctivae and EOM are normal. Pupils are equal, round, and reactive to light. No scleral icterus.  Neck: Normal range of motion. Neck supple.  Cardiovascular: Normal rate and regular rhythm.   Murmur heard. Pulmonary/Chest: Effort normal and breath sounds normal. She has no wheezes.  Musculoskeletal: She exhibits no edema.  Lymphadenopathy:    She has no cervical adenopathy.  Neurological: She is alert.  Skin: Skin is warm and dry. There is erythema. No pallor.       Healed papules post R thigh and behind knee 2 new 1 cm papule resembling simple insect bites on L shin- no excoriation No pus formation or streaking  Also resemble mosquito bites   Psychiatric: She has a normal mood and affect.       Baseline MR - repeats herself often          Assessment & Plan:

## 2011-07-29 NOTE — Assessment & Plan Note (Signed)
These resemble mosquito bites  On right leg are scabbed and getting better  On L leg- 2 new ones Recommend continue the antibacterial oint and hydrocortisone until resolved and do not scratch  Update if not starting to improve in a week or if worsening

## 2011-08-02 ENCOUNTER — Emergency Department (INDEPENDENT_AMBULATORY_CARE_PROVIDER_SITE_OTHER)
Admission: EM | Admit: 2011-08-02 | Discharge: 2011-08-02 | Disposition: A | Discharge status: Home / Self Care | Payer: Medicare Other | Source: Home / Self Care | Attending: Emergency Medicine | Admitting: Emergency Medicine

## 2011-08-02 ENCOUNTER — Encounter (HOSPITAL_COMMUNITY): Payer: Self-pay

## 2011-08-02 DIAGNOSIS — J40 Bronchitis, not specified as acute or chronic: Secondary | ICD-10-CM | POA: Diagnosis not present

## 2011-08-02 MED ORDER — AZITHROMYCIN 250 MG PO TABS: 250.0000 mg | ORAL_TABLET | Freq: Every day | ORAL | Status: AC

## 2011-08-02 MED ORDER — ALBUTEROL SULFATE HFA 108 (90 BASE) MCG/ACT IN AERS: 1.0000 | INHALATION_SPRAY | Freq: Four times a day (QID) | RESPIRATORY_TRACT | Status: DC | PRN

## 2011-08-02 NOTE — ED Notes (Signed)
Pt has chest congestion and has been taking mucinex and feels that it isn't helping.

## 2011-08-02 NOTE — ED Provider Notes (Signed)
History     CSN: [id number]  Arrival date & time 08/02/11  1116   First MD Initiated Contact with Patient 08/02/11 1121      Chief Complaint  Patient presents with  . Cough    (Consider location/radiation/quality/duration/timing/severity/associated sxs/prior treatment) HPI Comments: Patient reports cough for the past several weeks. Reports URI symptoms initially, which have since resolved.   States that she was started on Mucinex originally with significant relief but states she feels it is no longer working for her. No nausea, vomiting, fevers. Questionable history of asthma, states she's been placed on bronchodilators in the past. She is not a smoker.  Patient is a 56 y.o. female presenting with cough. The history is provided by the patient. No language interpreter was used.  Cough This is a new problem. The current episode started more than 1 week ago. The problem occurs constantly. The problem has been gradually worsening. The cough is non-productive. There has been no fever. Pertinent negatives include no chest pain, no chills, no headaches, no rhinorrhea, no sore throat, no myalgias, no shortness of breath and no wheezing. She has tried decongestants for the symptoms. The treatment provided mild relief. She is not a smoker. Her past medical history is significant for asthma. Her past medical history does not include pneumonia or COPD.    Past Medical History  Diagnosis Date  . Allergic rhinitis, cause unspecified   . Pain in joint, ankle and foot   . Backache, unspecified     chronic LBP with radiculopathy  . Seborrheic dermatitis, unspecified   . Type II or unspecified type diabetes mellitus without mention of complication, not stated as uncontrolled   . History of fracture of arm     Right  . History of fracture of foot   . Dermatomycosis, unspecified   . Esophageal reflux   . Unspecified glaucoma   . Undiagnosed cardiac murmurs   . Diaphragmatic hernia without mention of  obstruction or gangrene   . Other and unspecified hyperlipidemia   . Unspecified essential hypertension   . Irritable bowel syndrome   . Benign neoplasm of cerebral meninges   . Moderate intellectual disabilities   . Nausea alone   . Osteoarthrosis, unspecified whether generalized or localized, ankle and foot   . Pruritus ani   . Unspecified tinnitus   . Nonspecific elevation of levels of transaminase or lactic acid dehydrogenase (LDH)   . Leukorrhea, not specified as infective   . Obesity   . Hyperglycemia   . DDD (degenerative disc disease), lumbar     Past Surgical History  Procedure Date  . Total abdominal hysterectomy   . Cataract extraction   . Elbow surgery     right elbow    Family History  Problem Relation Age of Onset  . Diabetes Mother   . Diabetes      Aunt  . Stroke      Aunt (had pacemaker)    History  Substance Use Topics  . Smoking status: Never Smoker   . Smokeless tobacco: Never Used  . Alcohol Use: No    OB History    Grav Para Term Preterm Abortions TAB SAB Ect Mult Living                  Review of Systems  Constitutional: Negative for chills.  HENT: Negative for sore throat and rhinorrhea.   Respiratory: Positive for cough. Negative for shortness of breath and wheezing.   Cardiovascular: Negative for chest  pain.  Musculoskeletal: Negative for myalgias.  Neurological: Negative for headaches.    Allergies  Ace inhibitors; Amoxicillin-pot clavulanate; Amoxicillin-pot clavulanate; Cyclobenzaprine hcl; Flonase; and Ibuprofen  Home Medications   Current Outpatient Rx  Name Route Sig Dispense Refill  . GUAIFENESIN ER 600 MG PO TB12 Oral Take 1,200 mg by mouth 2 (two) times daily.    . ALBUTEROL SULFATE HFA 108 (90 BASE) MCG/ACT IN AERS Inhalation Inhale 1-2 puffs into the lungs every 6 (six) hours as needed for wheezing. 1 Inhaler 0  . ASPIRIN 81 MG PO TABS Oral Take 81 mg by mouth daily.      . AZITHROMYCIN 250 MG PO TABS Oral Take 1  tablet (250 mg total) by mouth daily. 2 tabs po on day one, then one tablet po once daily on days 2-5. 6 tablet 0  . DILTIAZEM HCL ER COATED BEADS 240 MG PO CP24 Oral Take 240 mg by mouth daily.    Marland Kitchen GABAPENTIN 100 MG PO CAPS  Take one capsule by mouth at bedtime for one week then one twice a day for one week then one capsule three times a day    . GLYCOPYRROLATE 2 MG PO TABS  Take one tab 3 times a day as needed for abdominal pain 30 tablet 1  . HYDROCHLOROTHIAZIDE 12.5 MG PO CAPS Oral Take 12.5 mg by mouth daily.    Marland Kitchen KETOCONAZOLE 2 % EX SHAM Topical Apply 1 application topically 2 (two) times a week.    Marland Kitchen LATANOPROST 0.005 % OP SOLN      . LORATADINE 10 MG PO TABS Oral Take 10 mg by mouth daily.    Marland Kitchen METFORMIN HCL 500 MG PO TABS Oral Take 500 mg by mouth 2 (two) times daily with a meal.    . NITROFURANTOIN MACROCRYSTAL 100 MG PO CAPS Oral Take 100 mg by mouth 2 (two) times daily.    . NYSTATIN 100000 UNIT/GM EX POWD Topical Apply 1 Units topically 3 (three) times daily.    . NYSTATIN 100000 UNIT/GM EX POWD  APPLY TO AFFECTED AREA 3 TIMES DAILY 30 g 3  . OMEPRAZOLE 20 MG PO CPDR Oral Take 1 capsule (20 mg total) by mouth 2 (two) times daily. 60 capsule 11  . POTASSIUM CHLORIDE CRYS ER 20 MEQ PO TBCR Oral Take 20 mEq by mouth daily.    Marland Kitchen PRAVASTATIN SODIUM 40 MG PO TABS Oral Take 1 tablet (40 mg total) by mouth every evening. 30 tablet 11  . PSYLLIUM 58.6 % PO POWD Oral Take 1 packet by mouth 3 (three) times daily.    Marland Kitchen SOLIFENACIN SUCCINATE 5 MG PO TABS Oral Take 1 tablet (5 mg total) by mouth daily. 30 tablet 1    BP 122/70  Pulse 88  Temp 98.7 F (37.1 C) (Oral)  Resp 19  SpO2 98%  Physical Exam  Nursing note and vitals reviewed. Constitutional: She is oriented to person, place, and time. She appears well-developed and well-nourished.  HENT:  Head: Normocephalic and atraumatic.  Eyes: Conjunctivae and EOM are normal.  Neck: Normal range of motion.  Cardiovascular: Normal rate,  regular rhythm, normal heart sounds and intact distal pulses.   Pulmonary/Chest: Effort normal and breath sounds normal. No respiratory distress. She has no rales. She exhibits no tenderness.       Scattered rhonchi left upper lobe.  Abdominal: Soft. Bowel sounds are normal. She exhibits no distension. There is no tenderness. There is no rebound and no guarding.  Musculoskeletal:  Normal range of motion.  Neurological: She is alert and oriented to person, place, and time.  Skin: Skin is warm and dry.  Psychiatric: She has a normal mood and affect. Her behavior is normal. Judgment and thought content normal.    ED Course  Procedures (including critical care time)  Labs Reviewed - No data to display No results found.   1. Bronchitis     MDM  Afebrile, no respiratory distress. Scattered rhonchi of left upper lobe. home with bronchodilators and azithromycin as she has multiple comorbidities. Deferring x-ray as this will not change management. Withholding steroids because she has diabetes. Will have her continue Mucinex. Has no problems at night, referring cough syrup. Will have her followup with her primary care physician several days if no significant improvement. Discussed signs and symptoms that should prompt return.  Luiz Blare, MD 08/02/11 1425

## 2011-08-04 ENCOUNTER — Encounter: Payer: Self-pay | Admitting: Family Medicine

## 2011-08-04 ENCOUNTER — Telehealth: Payer: Self-pay | Admitting: Family Medicine

## 2011-08-04 ENCOUNTER — Ambulatory Visit (INDEPENDENT_AMBULATORY_CARE_PROVIDER_SITE_OTHER): Payer: Medicare Other | Admitting: Family Medicine

## 2011-08-04 VITALS — BP 122/64 | HR 72 | Temp 97.7°F | Ht 62.0 in | Wt 194.8 lb

## 2011-08-04 DIAGNOSIS — J209 Acute bronchitis, unspecified: Secondary | ICD-10-CM

## 2011-08-04 DIAGNOSIS — B9689 Other specified bacterial agents as the cause of diseases classified elsewhere: Secondary | ICD-10-CM | POA: Insufficient documentation

## 2011-08-04 MED ORDER — METFORMIN HCL 500 MG PO TABS: 500.0000 mg | ORAL_TABLET | Freq: Two times a day (BID) | ORAL | Status: DC

## 2011-08-04 NOTE — Assessment & Plan Note (Addendum)
In pt who is mentally disabled and worried  Rev pt's urgent care note and she is much better now  Disc nature of bronchitis and what it is  Will finish zpak unless diarrhea becomes severe  Cough and other symptoms improved Reviewed each med and what it is for and when to take it  Pt expressed understanding and will f/u if worse or not improving further

## 2011-08-04 NOTE — Progress Notes (Signed)
Subjective:    Patient ID: Linda Thompson, female    DOB: Aug 07, 1955, 56 y.o.   MRN: [id number]  HPI Was in urgent care with bronchitis over the weekend  Is feeling a lot better  Went in with productive  Cough  Given zpak and also a guifenesin product  This is helping  Given albuterol hfa for wheezing - pt thinks she was wheezing a little (hers had expired ) Reviewed the note with her today  Is still coughing a little bit -but better overall Feels ok  No chills or aches or rash  The zpak is causing some diarrhea  She can tolerate that  No n/v No fever   Patient Active Problem List  Diagnosis  . FUNGAL DERMATITIS  . MENINGIOMA  . DIABETES MELLITUS, TYPE II  . HYPERLIPIDEMIA  . MENTAL RETARDATION, MODERATE  . GLAUCOMA NOS  . HYPERTENSION  . ALLERGIC  RHINITIS  . GERD  . HIATAL HERNIA  . DERMATITIS, SEBORRHEIC NOS  . PRURITUS ANI  . OSTEOARTHRITIS, FOOT  . HEART MURMUR  . TRANSAMINASES, SERUM, ELEVATED  . Personal history of other musculoskeletal disorders  . IRRITABLE BOWEL SYNDROME  . Hypokalemia  . Back pain  . Candidal intertrigo  . Obesity  . Tinea pedis  . Insect bites   Past Medical History  Diagnosis Date  . Allergic rhinitis, cause unspecified   . Pain in joint, ankle and foot   . Backache, unspecified     chronic LBP with radiculopathy  . Seborrheic dermatitis, unspecified   . Type II or unspecified type diabetes mellitus without mention of complication, not stated as uncontrolled   . History of fracture of arm     Right  . History of fracture of foot   . Dermatomycosis, unspecified   . Esophageal reflux   . Unspecified glaucoma   . Undiagnosed cardiac murmurs   . Diaphragmatic hernia without mention of obstruction or gangrene   . Other and unspecified hyperlipidemia   . Unspecified essential hypertension   . Irritable bowel syndrome   . Benign neoplasm of cerebral meninges   . Moderate intellectual disabilities   . Nausea alone   .  Osteoarthrosis, unspecified whether generalized or localized, ankle and foot   . Pruritus ani   . Unspecified tinnitus   . Nonspecific elevation of levels of transaminase or lactic acid dehydrogenase (LDH)   . Leukorrhea, not specified as infective   . Obesity   . Hyperglycemia   . DDD (degenerative disc disease), lumbar    Past Surgical History  Procedure Date  . Total abdominal hysterectomy   . Cataract extraction   . Elbow surgery     right elbow   History  Substance Use Topics  . Smoking status: Never Smoker   . Smokeless tobacco: Never Used  . Alcohol Use: No   Family History  Problem Relation Age of Onset  . Diabetes Mother   . Diabetes      Aunt  . Stroke      Aunt (had pacemaker)   Allergies  Allergen Reactions  . Ace Inhibitors     REACTION: COUGH  . Amoxicillin-Pot Clavulanate     REACTION: diarrhea  . Amoxicillin-Pot Clavulanate Other (See Comments)    Pt unaware of reaction to augmentin  . Cyclobenzaprine Hcl     REACTION: diarrhea  . Flonase (Fluticasone Propionate)     nosebleed  . Ibuprofen     REACTION: vomiting   Current Outpatient Prescriptions on File  Prior to Visit  Medication Sig Dispense Refill  . albuterol (PROVENTIL HFA;VENTOLIN HFA) 108 (90 BASE) MCG/ACT inhaler Inhale 1-2 puffs into the lungs every 6 (six) hours as needed for wheezing.  1 Inhaler  0  . aspirin 81 MG tablet Take 81 mg by mouth daily.        Marland Kitchen azithromycin (ZITHROMAX) 250 MG tablet Take 1 tablet (250 mg total) by mouth daily. 2 tabs po on day one, then one tablet po once daily on days 2-5.  6 tablet  0  . diltiazem (CARDIZEM CD) 240 MG 24 hr capsule Take 240 mg by mouth daily.      Marland Kitchen gabapentin (NEURONTIN) 100 MG capsule Take one capsule by mouth at bedtime for one week then one twice a day for one week then one capsule three times a day      . glycopyrrolate (ROBINUL) 2 MG tablet Take one tab 3 times a day as needed for abdominal pain  30 tablet  1  . guaiFENesin (MUCINEX)  600 MG 12 hr tablet Take 1,200 mg by mouth 2 (two) times daily.      . hydrochlorothiazide (MICROZIDE) 12.5 MG capsule Take 12.5 mg by mouth daily.      Marland Kitchen ketoconazole (NIZORAL) 2 % shampoo Apply 1 application topically 2 (two) times a week.      . latanoprost (XALATAN) 0.005 % ophthalmic solution       . loratadine (CLARITIN) 10 MG tablet Take 10 mg by mouth daily.      . metFORMIN (GLUCOPHAGE) 500 MG tablet Take 500 mg by mouth 2 (two) times daily with a meal.      . nitrofurantoin (MACRODANTIN) 100 MG capsule Take 100 mg by mouth 2 (two) times daily.      Marland Kitchen nystatin (MYCOSTATIN) powder Apply 1 Units topically 3 (three) times daily.      Marland Kitchen nystatin (NYSTOP) 100000 UNIT/GM POWD APPLY TO AFFECTED AREA 3 TIMES DAILY  30 g  3  . omeprazole (PRILOSEC) 20 MG capsule Take 1 capsule (20 mg total) by mouth 2 (two) times daily.  60 capsule  11  . potassium chloride SA (K-DUR,KLOR-CON) 20 MEQ tablet Take 20 mEq by mouth daily.      . pravastatin (PRAVACHOL) 40 MG tablet Take 1 tablet (40 mg total) by mouth every evening.  30 tablet  11  . psyllium (METAMUCIL) 58.6 % powder Take 1 packet by mouth 3 (three) times daily.      . solifenacin (VESICARE) 5 MG tablet Take 1 tablet (5 mg total) by mouth daily.  30 tablet  1  . DISCONTD: Calcium Carbonate-Vitamin D (CALCIUM-VITAMIN D) 500-200 MG-UNIT per tablet Take 1 tablet by mouth 2 (two) times daily with a meal.        . DISCONTD: fexofenadine (ALLEGRA) 180 MG tablet Take 1 tablet (180 mg total) by mouth daily.  15 tablet  0        Review of Systems Review of Systems  Constitutional: Negative for fever, appetite change, fatigue and unexpected weight change.  Eyes: Negative for pain and visual disturbance.  Respiratory: Negative for  shortness of breath or wheeze, pos for mild cough that is improving  Cardiovascular: Negative for cp or palpitations    Gastrointestinal: Negative for nausea, and constipation. neg for abd pain or blood in  stool Genitourinary: Negative for urgency and frequency.  Skin: Negative for pallor or rash   Neurological: Negative for weakness, light-headedness, numbness and headaches.  Hematological: Negative  for adenopathy. Does not bruise/bleed easily.  Psychiatric/Behavioral: Negative for dysphoric mood. The patient is not nervous/anxious.         Objective:   Physical Exam  Constitutional: She appears well-developed and well-nourished. No distress.       Obese and well appearing  HENT:  Head: Normocephalic and atraumatic.  Right Ear: External ear normal.  Left Ear: External ear normal.  Mouth/Throat: Oropharynx is clear and moist.       Nares boggy Some clear post nasal drip No sinus tenderness  Eyes: Conjunctivae and EOM are normal. Pupils are equal, round, and reactive to light. Right eye exhibits no discharge. Left eye exhibits no discharge.  Neck: Normal range of motion. Neck supple.  Cardiovascular: Normal rate and regular rhythm.  Exam reveals no gallop.   Pulmonary/Chest: Effort normal and breath sounds normal. No respiratory distress. She has no wheezes. She has no rales. She exhibits no tenderness.       bs are harsh at bases No crackles No wheeze even on forced exp  Abdominal: Soft. Bowel sounds are normal. She exhibits no distension and no mass. There is no tenderness.  Musculoskeletal: She exhibits no edema.  Lymphadenopathy:    She has no cervical adenopathy.  Neurological: She is alert. She has normal reflexes.  Skin: Skin is warm and dry. No rash noted. No erythema. No pallor.  Psychiatric: She has a normal mood and affect.          Assessment & Plan:

## 2011-08-04 NOTE — Telephone Encounter (Signed)
Triage Record Num: [id number] Operator: Tomasita Crumble Patient Name: Linda Thompson Call Date & Time: 08/02/2011 4:24:54PM Patient Phone: [phone] PCP: Audrie Gallus. Tower Patient Gender: Female PCP Fax : Patient DOB: June 26, 1955 Practice Name: [facility] Reason for Call: Caller: June/Patient; PCP: [name]; CB#: [phone]; Call regarding Stomach ache; dx w/ Bronchitis, medication question; went to UC ; rec'd Rx for antibiotics. Abdominal discomfort after started antibiotics 08/02/11. Azithromycin is new medication. Advised that abdominal cramps is common side effect and not a reason to stop medication or return to ED or UC. Caller voiced understanding. Medication Questions-Adult protocol used. Protocol(s) Used: Medication Questions - Adult Recommended Outcome per Protocol: Provided Health Information Reason for Outcome: Caller has medication question(s) that was answered with available resources Care Advice: ~ 08/02/2011 4:40:11PM Page 1 of 1 [email or web address]

## 2011-08-04 NOTE — Patient Instructions (Addendum)
You have bronchitis (which is like a cold in your chest) Use the mucous relief only if you are coughing up phlegm  Use the inhaler only if you wheeze  Drink lots of fluids Finish the zithomax - even if it gives you diarrhea (however if the diarrhea is severe let me know )  Update if not starting to improve in a week or if worsening

## 2011-08-06 ENCOUNTER — Ambulatory Visit (INDEPENDENT_AMBULATORY_CARE_PROVIDER_SITE_OTHER): Payer: Medicare Other | Admitting: Family Medicine

## 2011-08-06 ENCOUNTER — Telehealth: Payer: Self-pay | Admitting: Family Medicine

## 2011-08-06 ENCOUNTER — Encounter: Payer: Self-pay | Admitting: Family Medicine

## 2011-08-06 VITALS — BP 134/82 | HR 90 | Temp 97.9°F | Ht 62.0 in | Wt 197.8 lb

## 2011-08-06 DIAGNOSIS — J029 Acute pharyngitis, unspecified: Secondary | ICD-10-CM

## 2011-08-06 DIAGNOSIS — B9789 Other viral agents as the cause of diseases classified elsewhere: Secondary | ICD-10-CM | POA: Insufficient documentation

## 2011-08-06 LAB — POCT RAPID STREP A (OFFICE): Rapid Strep A Screen: NEGATIVE

## 2011-08-06 NOTE — Telephone Encounter (Signed)
Triage Record Num: [id number] Operator: Maryfrances Bunnell Patient Name: Linda Thompson Call Date & Time: 08/05/2011 5:48:16PM Patient Phone: [phone] PCP: Audrie Gallus. Tower Patient Gender: Female PCP Fax : Patient DOB: [birth year] Practice Name: [facility] Reason for Call: Caller: [name]/Patient; PCP: [name]; CB#: [phone]; Call regarding Cough/Congestion; Seen in Urgent care and treated for Bronchitis. Symptoms not worse. Asking what she can do for her hoarse voice. Per Sore Throat or Hoarseness Protocol all emergent symptoms ruled out with exception of "Hoarseness." Home care advice given. Protocol(s) Used: Sore Throat or Hoarseness Recommended Outcome per Protocol: Provide Home/Self Care Reason for Outcome: Hoarseness Care Advice: ~ Use a cool mist humidifier to moisten air. Be sure to clean according to manufacturer's instructions. If hoarseness is the result of overuse of the voice, try resting the voice. Consider seeing a voice therapist to learn how to alter method of speech so that voice is not strained. ~ Call provider immediately if cough up blood, have difficulty swallowing, or swollen lymph glands in the neck or armpits. ~ ~ Anytime hoarseness is present, avoid talking and singing. ~ See a provider for hoarseness that does not resolve within 2 weeks. ~ SYMPTOM / CONDITION MANAGEMENT Most adults need to drink 6-10 eight-ounce glasses (1.2-2.0 liters) of fluids per day unless previously told to limit fluid intake for other medical reasons. Limit fluids that contain caffeine, sugar or alcohol. Urine will be a very light yellow color when you drink enough fluids. ~ Sore Throat Relief: - Use warm salt water gargles 3 to 4 times/day, as needed (1/2 tsp. salt in 8 oz. [.2 liters] water). - Suck on hard candy, nonprescription or herbal throat lozenges (sugar-free if diabetic) - Eat soothing, soft food/fluids (broths, soups, or honey and lemon juice in hot tea,  Popsicles, frozen yogurt or sherbet, scrambled eggs, cooked cereals, Jell-O or puddings) whichever is most comforting. - Avoid eating salty, spicy or acidic foods. ~ Hoarseness Care: - Avoid anything that may dry the throat tissues including tobacco smoke. - Limit the use of caffeine, alcohol, and decongestants. - Avoid whispering when hoarse, as this puts more strain on the voice than talking in a normal voice. - Avoid clearing throat. - Try sucking on throat lozenges or cough drops to ease the tightness in the throat. ~ 08/05/2011 6:03:21PM Page 1 of 1 [email or web address]

## 2011-08-06 NOTE — Assessment & Plan Note (Signed)
With negative rapid strep test today I think the throat congestion (not so much sore she told me) is related to the virus that caused the bronchitis I warned the pt this may turn into a head cold Disc symptomatic care - see instructions on AVS Update if not starting to improve in a week or if worsening   Pt voiced understanding

## 2011-08-06 NOTE — Patient Instructions (Addendum)
I think your throat discomfort is from the virus that caused you to cough It should get better on its own over the next week - but you may end up with some sneezing and nasal congestion before it stops  lozenges are fine  Step throat test is negative  Update if not starting to improve in a week or if worsening

## 2011-08-06 NOTE — Progress Notes (Signed)
Subjective:    Patient ID: Linda Thompson, female    DOB: Nov 05, 1955, 56 y.o.   MRN: [id number]  HPI  Here for sore throat  She finished her zithromax today  Is clearing her throat a lot -- not really sore yet but feels like it could be getting sore  Did a strep test today  Also some hoarseness   Cough is a whole lot better  No fever or other symptoms   Is drinking water and eating chicken noodle soup and sugar free jello losenges really help  Patient Active Problem List  Diagnosis  . FUNGAL DERMATITIS  . MENINGIOMA  . DIABETES MELLITUS, TYPE II  . HYPERLIPIDEMIA  . MENTAL RETARDATION, MODERATE  . GLAUCOMA NOS  . HYPERTENSION  . ALLERGIC  RHINITIS  . GERD  . HIATAL HERNIA  . DERMATITIS, SEBORRHEIC NOS  . PRURITUS ANI  . OSTEOARTHRITIS, FOOT  . HEART MURMUR  . TRANSAMINASES, SERUM, ELEVATED  . Personal history of other musculoskeletal disorders  . IRRITABLE BOWEL SYNDROME  . Hypokalemia  . Back pain  . Candidal intertrigo  . Obesity  . Tinea pedis  . Insect bites  . Acute bronchitis, bacterial   Past Medical History  Diagnosis Date  . Allergic rhinitis, cause unspecified   . Pain in joint, ankle and foot   . Backache, unspecified     chronic LBP with radiculopathy  . Seborrheic dermatitis, unspecified   . Type II or unspecified type diabetes mellitus without mention of complication, not stated as uncontrolled   . History of fracture of arm     Right  . History of fracture of foot   . Dermatomycosis, unspecified   . Esophageal reflux   . Unspecified glaucoma   . Undiagnosed cardiac murmurs   . Diaphragmatic hernia without mention of obstruction or gangrene   . Other and unspecified hyperlipidemia   . Unspecified essential hypertension   . Irritable bowel syndrome   . Benign neoplasm of cerebral meninges   . Moderate intellectual disabilities   . Nausea alone   . Osteoarthrosis, unspecified whether generalized or localized, ankle and foot   .  Pruritus ani   . Unspecified tinnitus   . Nonspecific elevation of levels of transaminase or lactic acid dehydrogenase (LDH)   . Leukorrhea, not specified as infective   . Obesity   . Hyperglycemia   . DDD (degenerative disc disease), lumbar    Past Surgical History  Procedure Date  . Total abdominal hysterectomy   . Cataract extraction   . Elbow surgery     right elbow   History  Substance Use Topics  . Smoking status: Never Smoker   . Smokeless tobacco: Never Used  . Alcohol Use: No   Family History  Problem Relation Age of Onset  . Diabetes Mother   . Diabetes      Aunt  . Stroke      Aunt (had pacemaker)   Allergies  Allergen Reactions  . Ace Inhibitors     REACTION: COUGH  . Amoxicillin-Pot Clavulanate     REACTION: diarrhea  . Amoxicillin-Pot Clavulanate Other (See Comments)    Pt unaware of reaction to augmentin  . Cyclobenzaprine Hcl     REACTION: diarrhea  . Flonase (Fluticasone Propionate)     nosebleed  . Ibuprofen     REACTION: vomiting   Current Outpatient Prescriptions on File Prior to Visit  Medication Sig Dispense Refill  . albuterol (PROVENTIL HFA;VENTOLIN HFA) 108 (90 BASE) MCG/ACT  inhaler Inhale 1-2 puffs into the lungs every 6 (six) hours as needed for wheezing.  1 Inhaler  0  . aspirin 81 MG tablet Take 81 mg by mouth daily.        Marland Kitchen azithromycin (ZITHROMAX) 250 MG tablet Take 1 tablet (250 mg total) by mouth daily. 2 tabs po on day one, then one tablet po once daily on days 2-5.  6 tablet  0  . diltiazem (CARDIZEM CD) 240 MG 24 hr capsule Take 240 mg by mouth daily.      Marland Kitchen gabapentin (NEURONTIN) 100 MG capsule Take one capsule by mouth at bedtime for one week then one twice a day for one week then one capsule three times a day      . glycopyrrolate (ROBINUL) 2 MG tablet Take one tab 3 times a day as needed for abdominal pain  30 tablet  1  . guaiFENesin (MUCINEX) 600 MG 12 hr tablet Take 1,200 mg by mouth 2 (two) times daily.      .  hydrochlorothiazide (MICROZIDE) 12.5 MG capsule Take 12.5 mg by mouth daily.      Marland Kitchen ketoconazole (NIZORAL) 2 % shampoo Apply 1 application topically 2 (two) times a week.      . latanoprost (XALATAN) 0.005 % ophthalmic solution       . loratadine (CLARITIN) 10 MG tablet Take 10 mg by mouth daily.      . metFORMIN (GLUCOPHAGE) 500 MG tablet Take 1 tablet (500 mg total) by mouth 2 (two) times daily with a meal.  60 tablet  11  . nitrofurantoin (MACRODANTIN) 100 MG capsule Take 100 mg by mouth 2 (two) times daily.      Marland Kitchen nystatin (MYCOSTATIN) powder Apply 1 Units topically 3 (three) times daily.      Marland Kitchen nystatin (NYSTOP) 100000 UNIT/GM POWD APPLY TO AFFECTED AREA 3 TIMES DAILY  30 g  3  . omeprazole (PRILOSEC) 20 MG capsule Take 1 capsule (20 mg total) by mouth 2 (two) times daily.  60 capsule  11  . potassium chloride SA (K-DUR,KLOR-CON) 20 MEQ tablet Take 20 mEq by mouth daily.      . pravastatin (PRAVACHOL) 40 MG tablet Take 1 tablet (40 mg total) by mouth every evening.  30 tablet  11  . psyllium (METAMUCIL) 58.6 % powder Take 1 packet by mouth 3 (three) times daily.      . solifenacin (VESICARE) 5 MG tablet Take 1 tablet (5 mg total) by mouth daily.  30 tablet  1  . DISCONTD: Calcium Carbonate-Vitamin D (CALCIUM-VITAMIN D) 500-200 MG-UNIT per tablet Take 1 tablet by mouth 2 (two) times daily with a meal.        . DISCONTD: fexofenadine (ALLEGRA) 180 MG tablet Take 1 tablet (180 mg total) by mouth daily.  15 tablet  0     Review of Systems Review of Systems  Constitutional: Negative for fever, appetite change, fatigue and unexpected weight change.  Eyes: Negative for pain and visual disturbance.  ENt pos for some post nasal drip with throat clearing, occ sneezing, neg for sinus pain or congestion  Respiratory: Negative for cough and shortness of breath.  (cough is better) Cardiovascular: Negative for cp or palpitations    Gastrointestinal: Negative for nausea, diarrhea and constipation.    Genitourinary: Negative for urgency and frequency.  Skin: Negative for pallor or rash   Neurological: Negative for weakness, light-headedness, numbness and headaches.  Hematological: Negative for adenopathy. Does not bruise/bleed easily.  Psychiatric/Behavioral:  Negative for dysphoric mood. The patient is not nervous/anxious.         Objective:   Physical Exam  Constitutional: She appears well-developed and well-nourished. No distress.       Obese and well appearing   HENT:  Head: Normocephalic and atraumatic.  Right Ear: External ear normal.  Left Ear: External ear normal.  Nose: Nose normal.  Mouth/Throat: Oropharynx is clear and moist. No oropharyngeal exudate.       Throat is clear with clear post nasal drip   Eyes: Conjunctivae and EOM are normal. Pupils are equal, round, and reactive to light. Right eye exhibits no discharge. Left eye exhibits no discharge.  Neck: Normal range of motion. Neck supple.  Cardiovascular: Normal rate, regular rhythm and normal heart sounds.   Pulmonary/Chest: Effort normal and breath sounds normal. No respiratory distress. She has no wheezes. She has no rales. She exhibits no tenderness.  Lymphadenopathy:    She has no cervical adenopathy.  Neurological: She is alert.  Skin: Skin is warm and dry. No rash noted. No erythema.  Psychiatric: She has a normal mood and affect.       Baseline MR - repeats statements frequently          Assessment & Plan:

## 2011-08-15 ENCOUNTER — Ambulatory Visit (INDEPENDENT_AMBULATORY_CARE_PROVIDER_SITE_OTHER): Payer: Medicare Other | Admitting: Family Medicine

## 2011-08-15 ENCOUNTER — Encounter: Payer: Self-pay | Admitting: Family Medicine

## 2011-08-15 VITALS — BP 108/78 | HR 94 | Temp 97.6°F | Wt 192.0 lb

## 2011-08-15 DIAGNOSIS — R109 Unspecified abdominal pain: Secondary | ICD-10-CM

## 2011-08-15 NOTE — Progress Notes (Signed)
As seen at [facility].  Stared on zmax and inhaler.  Has seen Dr. Milinda Antis in meantime.  Is also on metamucil; was advised to hold metamucil while on zmax.  Now with some burning sensation in the lower abdomen, started earlier today "enough to notice" and "not as bad now".  The burning was never severe.  Not constipated.  No burning with urination. No FCNAV, no diarrhea.  Regular BMs. No blood in stool.  No back pain. She repeats herself frequently, at baseline per old records.    Meds, vitals, and allergies reviewed.   ROS: See HPI.  Otherwise, noncontributory.  GEN: nad, alert and oriented HEENT: mucous membranes moist NECK: supple w/o LA CV: rrr.  PULM: ctab, no inc wob ABD: soft, +bs, not ttp EXT: no edema SKIN: no acute rash

## 2011-08-15 NOTE — Patient Instructions (Addendum)
Don't change your meds for now.  Drink plenty of fluids and this should improve on it's own.  If you continue to have more pain, then notify Dr. Milinda Antis.

## 2011-08-17 DIAGNOSIS — R109 Unspecified abdominal pain: Secondary | ICD-10-CM | POA: Insufficient documentation

## 2011-08-17 NOTE — Assessment & Plan Note (Signed)
Unclear source but minimal sx and benign exam.  Could be related to abx but since she is done with the course, no other tx needed. Would follow clinically. She agrees.  F/u prn.  Will notify PCP.

## 2011-08-18 ENCOUNTER — Telehealth: Payer: Self-pay | Admitting: Family Medicine

## 2011-08-18 NOTE — Telephone Encounter (Signed)
Keep taking the metamucil - f/u if symptoms do not improve

## 2011-08-18 NOTE — Telephone Encounter (Signed)
Lumen wants to know if she should still take the Meta Mucil? She states that she is hurting a little in her back on her left side, but has had a bowel movement on Sunday and one also today.

## 2011-08-19 DIAGNOSIS — M545 Low back pain: Secondary | ICD-10-CM | POA: Diagnosis not present

## 2011-08-19 DIAGNOSIS — IMO0002 Reserved for concepts with insufficient information to code with codable children: Secondary | ICD-10-CM | POA: Diagnosis not present

## 2011-08-19 DIAGNOSIS — M25579 Pain in unspecified ankle and joints of unspecified foot: Secondary | ICD-10-CM | POA: Diagnosis not present

## 2011-08-19 DIAGNOSIS — S93609A Unspecified sprain of unspecified foot, initial encounter: Secondary | ICD-10-CM | POA: Diagnosis not present

## 2011-08-21 ENCOUNTER — Ambulatory Visit (INDEPENDENT_AMBULATORY_CARE_PROVIDER_SITE_OTHER): Payer: Medicare Other | Admitting: Family Medicine

## 2011-08-21 ENCOUNTER — Other Ambulatory Visit: Payer: Self-pay | Admitting: Family Medicine

## 2011-08-21 ENCOUNTER — Encounter: Payer: Self-pay | Admitting: Family Medicine

## 2011-08-21 VITALS — BP 122/82 | Temp 97.6°F | Wt 193.0 lb

## 2011-08-21 DIAGNOSIS — W57XXXA Bitten or stung by nonvenomous insect and other nonvenomous arthropods, initial encounter: Secondary | ICD-10-CM | POA: Diagnosis not present

## 2011-08-21 DIAGNOSIS — T148XXA Other injury of unspecified body region, initial encounter: Secondary | ICD-10-CM | POA: Diagnosis not present

## 2011-08-21 DIAGNOSIS — T148 Other injury of unspecified body region: Secondary | ICD-10-CM | POA: Diagnosis not present

## 2011-08-21 NOTE — Patient Instructions (Addendum)
I agree the spots on your legs are insect bites are appear to be getting better. Continue the poly bacitracin and hydrocortisone until all the areas clear up  Keep areas clean with antibacterial soap and water

## 2011-08-21 NOTE — Telephone Encounter (Signed)
Electronic refill request

## 2011-08-21 NOTE — Progress Notes (Signed)
Subjective:    Patient ID: Linda Thompson, female    DOB: Oct 21, 1955, 56 y.o.   MRN: [id number]  HPI 56 yo pt of Dr. Milinda Antis here for insects bites.  Saw Dr. Milinda Antis for this on 7/9.    Went to urgent care last month. Diagnosed with insect bites.  Given antibitic ointment and also hydrocortisone  Dr. Milinda Antis advised to continue the poly bacitracin and hydrocortisone until all the areas clear up.     Patient Active Problem List  Diagnosis  . FUNGAL DERMATITIS  . MENINGIOMA  . DIABETES MELLITUS, TYPE II  . HYPERLIPIDEMIA  . MENTAL RETARDATION, MODERATE  . GLAUCOMA NOS  . HYPERTENSION  . ALLERGIC  RHINITIS  . GERD  . HIATAL HERNIA  . DERMATITIS, SEBORRHEIC NOS  . PRURITUS ANI  . OSTEOARTHRITIS, FOOT  . HEART MURMUR  . TRANSAMINASES, SERUM, ELEVATED  . Personal history of other musculoskeletal disorders  . IRRITABLE BOWEL SYNDROME  . Hypokalemia  . Back pain  . Candidal intertrigo  . Obesity  . Tinea pedis  . Insect bites  . Acute bronchitis, bacterial  . Viral sore throat  . Abdominal  pain, other specified site   Past Medical History  Diagnosis Date  . Allergic rhinitis, cause unspecified   . Pain in joint, ankle and foot   . Backache, unspecified     chronic LBP with radiculopathy  . Seborrheic dermatitis, unspecified   . Type II or unspecified type diabetes mellitus without mention of complication, not stated as uncontrolled   . History of fracture of arm     Right  . History of fracture of foot   . Dermatomycosis, unspecified   . Esophageal reflux   . Unspecified glaucoma   . Undiagnosed cardiac murmurs   . Diaphragmatic hernia without mention of obstruction or gangrene   . Other and unspecified hyperlipidemia   . Unspecified essential hypertension   . Irritable bowel syndrome   . Benign neoplasm of cerebral meninges   . Moderate intellectual disabilities   . Nausea alone   . Osteoarthrosis, unspecified whether generalized or localized, ankle and foot    . Pruritus ani   . Unspecified tinnitus   . Nonspecific elevation of levels of transaminase or lactic acid dehydrogenase (LDH)   . Leukorrhea, not specified as infective   . Obesity   . Hyperglycemia   . DDD (degenerative disc disease), lumbar    Past Surgical History  Procedure Date  . Total abdominal hysterectomy   . Cataract extraction   . Elbow surgery     right elbow   History  Substance Use Topics  . Smoking status: Never Smoker   . Smokeless tobacco: Never Used  . Alcohol Use: No   Family History  Problem Relation Age of Onset  . Diabetes Mother   . Diabetes      Aunt  . Stroke      Aunt (had pacemaker)   Allergies  Allergen Reactions  . Ace Inhibitors     REACTION: COUGH  . Amoxicillin-Pot Clavulanate     REACTION: diarrhea  . Amoxicillin-Pot Clavulanate Other (See Comments)    Pt unaware of reaction to augmentin  . Cyclobenzaprine Hcl     REACTION: diarrhea  . Flonase (Fluticasone Propionate)     nosebleed  . Ibuprofen     REACTION: vomiting   Current Outpatient Prescriptions on File Prior to Visit  Medication Sig Dispense Refill  . albuterol (PROVENTIL HFA;VENTOLIN HFA) 108 (90 BASE) MCG/ACT inhaler  Inhale 1-2 puffs into the lungs every 6 (six) hours as needed for wheezing.  1 Inhaler  0  . aspirin 81 MG tablet Take 81 mg by mouth daily.        Marland Kitchen diltiazem (CARDIZEM CD) 240 MG 24 hr capsule Take 240 mg by mouth daily.      Marland Kitchen gabapentin (NEURONTIN) 100 MG capsule Take one capsule by mouth at bedtime for one week then one twice a day for one week then one capsule three times a day      . glycopyrrolate (ROBINUL) 2 MG tablet Take one tab 3 times a day as needed for abdominal pain  30 tablet  1  . guaiFENesin (MUCINEX) 600 MG 12 hr tablet Take 1,200 mg by mouth 2 (two) times daily.      . hydrochlorothiazide (MICROZIDE) 12.5 MG capsule Take 12.5 mg by mouth daily.      Marland Kitchen ketoconazole (NIZORAL) 2 % shampoo Apply 1 application topically 2 (two) times a week.       . latanoprost (XALATAN) 0.005 % ophthalmic solution       . loratadine (CLARITIN) 10 MG tablet Take 10 mg by mouth daily.      . metFORMIN (GLUCOPHAGE) 500 MG tablet Take 1 tablet (500 mg total) by mouth 2 (two) times daily with a meal.  60 tablet  11  . nitrofurantoin (MACRODANTIN) 100 MG capsule Take 100 mg by mouth 2 (two) times daily.      Marland Kitchen nystatin (MYCOSTATIN) powder Apply 1 Units topically 3 (three) times daily.      Marland Kitchen nystatin (NYSTOP) 100000 UNIT/GM POWD APPLY TO AFFECTED AREA 3 TIMES DAILY  30 g  3  . omeprazole (PRILOSEC) 20 MG capsule Take 1 capsule (20 mg total) by mouth 2 (two) times daily.  60 capsule  11  . potassium chloride SA (K-DUR,KLOR-CON) 20 MEQ tablet Take 20 mEq by mouth daily.      . pravastatin (PRAVACHOL) 40 MG tablet Take 1 tablet (40 mg total) by mouth every evening.  30 tablet  11  . solifenacin (VESICARE) 5 MG tablet Take 1 tablet (5 mg total) by mouth daily.  30 tablet  1  . DISCONTD: Calcium Carbonate-Vitamin D (CALCIUM-VITAMIN D) 500-200 MG-UNIT per tablet Take 1 tablet by mouth 2 (two) times daily with a meal.        . DISCONTD: fexofenadine (ALLEGRA) 180 MG tablet Take 1 tablet (180 mg total) by mouth daily.  15 tablet  0      Review of Systems See HPI     Objective:   Physical Exam  BP 122/82  Temp 97.6 F (36.4 C)  Wt 193 lb (87.544 kg)  Constitutional: She appears well-developed and well-nourished. No distress.       Obese and well appearing   HENT:  Head: Normocephalic and atraumatic.  Mouth/Throat: Oropharynx is clear and moist.  Eyes: Conjunctivae and EOM are normal. Pupils are equal, round, and reactive to light. No scleral icterus.  Neck: Normal range of motion. Neck supple.  Cardiovascular: Normal rate and regular rhythm.   Murmur heard. Pulmonary/Chest: Effort normal and breath sounds normal. She has no wheezes.  Musculoskeletal: She exhibits no edema.  Lymphadenopathy:    She has no cervical adenopathy.  Neurological: She is  alert.  Skin: Skin is warm and dry.  One very small papule on back of left knee, resembling insect bite, no indication of infection.  Psychiatric: She has a normal mood and affect.  Baseline MR - repeats herself often          Assessment & Plan:   1. Insect bites    Continues to improve.  Reassurance provided. See pt instructions for details.

## 2011-08-22 ENCOUNTER — Telehealth: Payer: Self-pay | Admitting: Family Medicine

## 2011-08-22 MED ORDER — KETOCONAZOLE 2 % EX CREA: TOPICAL_CREAM | Freq: Every day | CUTANEOUS | Status: DC

## 2011-08-22 NOTE — Telephone Encounter (Signed)
I already did this

## 2011-08-22 NOTE — Telephone Encounter (Signed)
Done

## 2011-08-22 NOTE — Telephone Encounter (Signed)
Linda Thompson wants you to call in an rx for Ketoconazole 2% at [facility] in [place]. She wants a big tube.

## 2011-08-23 ENCOUNTER — Other Ambulatory Visit: Payer: Self-pay | Admitting: Family Medicine

## 2011-08-25 ENCOUNTER — Telehealth: Payer: Self-pay

## 2011-08-25 NOTE — Telephone Encounter (Signed)
Pt walked in with bowel problem; taking Metamucil as instructed had 2 BMs this morning; no diarrhea. Pt has burning sensation at base of lower abdomen after last BM. Pt wants to see Dr Milinda Antis. Appt 08/26/11 at 10:15am. Pt to call back if condition changes or worsens.

## 2011-08-26 ENCOUNTER — Encounter: Payer: Self-pay | Admitting: Family Medicine

## 2011-08-26 ENCOUNTER — Ambulatory Visit (INDEPENDENT_AMBULATORY_CARE_PROVIDER_SITE_OTHER): Payer: Medicare Other | Admitting: Family Medicine

## 2011-08-26 VITALS — BP 122/92 | HR 92 | Temp 98.0°F | Wt 192.0 lb

## 2011-08-26 DIAGNOSIS — K589 Irritable bowel syndrome without diarrhea: Secondary | ICD-10-CM | POA: Diagnosis not present

## 2011-08-26 NOTE — Patient Instructions (Signed)
If you think that the metamucil makes you better - then take it daily  If it makes you worse, don't take it  It does not matter what time of day you take it  Eat a healthy balanced diet

## 2011-08-26 NOTE — Progress Notes (Signed)
Subjective:    Patient ID: Linda Thompson, female    DOB: Apr 25, 1955, 56 y.o.   MRN: [id number]  HPI Was here last week for uri symptoms - took zithromax  Was told to start metamucil for diarrhea   Took first dose of metamucil and then had 2 bm  Had some burning in lower abdomen after her bm --this concerned her   Has started metamucil back now  Is doing ok overall No fever No n/v Uri symptoms are better   Sometimes takes otc anti diarrhea med if she really needs it (for instance on a church trip)  Patient Active Problem List  Diagnosis  . FUNGAL DERMATITIS  . MENINGIOMA  . DIABETES MELLITUS, TYPE II  . HYPERLIPIDEMIA  . MENTAL RETARDATION, MODERATE  . GLAUCOMA NOS  . HYPERTENSION  . ALLERGIC  RHINITIS  . GERD  . HIATAL HERNIA  . DERMATITIS, SEBORRHEIC NOS  . PRURITUS ANI  . OSTEOARTHRITIS, FOOT  . HEART MURMUR  . TRANSAMINASES, SERUM, ELEVATED  . Personal history of other musculoskeletal disorders  . IRRITABLE BOWEL SYNDROME  . Hypokalemia  . Back pain  . Candidal intertrigo  . Obesity  . Tinea pedis  . Insect bites  . Acute bronchitis, bacterial  . Viral sore throat  . Abdominal  pain, other specified site   Past Medical History  Diagnosis Date  . Allergic rhinitis, cause unspecified   . Pain in joint, ankle and foot   . Backache, unspecified     chronic LBP with radiculopathy  . Seborrheic dermatitis, unspecified   . Type II or unspecified type diabetes mellitus without mention of complication, not stated as uncontrolled   . History of fracture of arm     Right  . History of fracture of foot   . Dermatomycosis, unspecified   . Esophageal reflux   . Unspecified glaucoma   . Undiagnosed cardiac murmurs   . Diaphragmatic hernia without mention of obstruction or gangrene   . Other and unspecified hyperlipidemia   . Unspecified essential hypertension   . Irritable bowel syndrome   . Benign neoplasm of cerebral meninges   . Moderate intellectual  disabilities   . Nausea alone   . Osteoarthrosis, unspecified whether generalized or localized, ankle and foot   . Pruritus ani   . Unspecified tinnitus   . Nonspecific elevation of levels of transaminase or lactic acid dehydrogenase (LDH)   . Leukorrhea, not specified as infective   . Obesity   . Hyperglycemia   . DDD (degenerative disc disease), lumbar    Past Surgical History  Procedure Date  . Total abdominal hysterectomy   . Cataract extraction   . Elbow surgery     right elbow   History  Substance Use Topics  . Smoking status: Never Smoker   . Smokeless tobacco: Never Used  . Alcohol Use: No   Family History  Problem Relation Age of Onset  . Diabetes Mother   . Diabetes      Aunt  . Stroke      Aunt (had pacemaker)   Allergies  Allergen Reactions  . Ace Inhibitors     REACTION: COUGH  . Amoxicillin-Pot Clavulanate     REACTION: diarrhea  . Amoxicillin-Pot Clavulanate Other (See Comments)    Pt unaware of reaction to augmentin  . Cyclobenzaprine Hcl     REACTION: diarrhea  . Flonase (Fluticasone Propionate)     nosebleed  . Ibuprofen     REACTION: vomiting  Current Outpatient Prescriptions on File Prior to Visit  Medication Sig Dispense Refill  . albuterol (PROVENTIL HFA;VENTOLIN HFA) 108 (90 BASE) MCG/ACT inhaler Inhale 1-2 puffs into the lungs every 6 (six) hours as needed for wheezing.  1 Inhaler  0  . aspirin 81 MG tablet Take 81 mg by mouth daily.        Marland Kitchen diltiazem (CARDIZEM CD) 240 MG 24 hr capsule Take 240 mg by mouth daily.      Marland Kitchen gabapentin (NEURONTIN) 100 MG capsule Take one capsule by mouth at bedtime for one week then one twice a day for one week then one capsule three times a day      . glycopyrrolate (ROBINUL) 2 MG tablet Take one tab 3 times a day as needed for abdominal pain  30 tablet  1  . guaiFENesin (MUCINEX) 600 MG 12 hr tablet Take 1,200 mg by mouth 2 (two) times daily.      . hydrochlorothiazide (MICROZIDE) 12.5 MG capsule Take 12.5  mg by mouth daily.      Marland Kitchen ketoconazole (NIZORAL) 2 % cream Apply topically daily. As needed to affected area  60 g  3  . ketoconazole (NIZORAL) 2 % shampoo Apply 1 application topically 2 (two) times a week.      Marland Kitchen KLOR-CON M20 20 MEQ tablet TAKE 1 TABLET BY MOUTH DAILY  30 tablet  6  . latanoprost (XALATAN) 0.005 % ophthalmic solution       . loratadine (CLARITIN) 10 MG tablet Take 10 mg by mouth daily.      . metFORMIN (GLUCOPHAGE) 500 MG tablet Take 1 tablet (500 mg total) by mouth 2 (two) times daily with a meal.  60 tablet  11  . nitrofurantoin (MACRODANTIN) 100 MG capsule Take 100 mg by mouth 2 (two) times daily.      Marland Kitchen nystatin (MYCOSTATIN) powder Apply 1 Units topically 3 (three) times daily.      Marland Kitchen nystatin (NYSTOP) 100000 UNIT/GM POWD APPLY TO AFFECTED AREA 3 TIMES DAILY  30 g  3  . omeprazole (PRILOSEC) 20 MG capsule Take 1 capsule (20 mg total) by mouth 2 (two) times daily.  60 capsule  11  . potassium chloride SA (K-DUR,KLOR-CON) 20 MEQ tablet Take 20 mEq by mouth daily.      . pravastatin (PRAVACHOL) 40 MG tablet Take 1 tablet (40 mg total) by mouth every evening.  30 tablet  11  . solifenacin (VESICARE) 5 MG tablet Take 1 tablet (5 mg total) by mouth daily.  30 tablet  1  . DISCONTD: Calcium Carbonate-Vitamin D (CALCIUM-VITAMIN D) 500-200 MG-UNIT per tablet Take 1 tablet by mouth 2 (two) times daily with a meal.        . DISCONTD: fexofenadine (ALLEGRA) 180 MG tablet Take 1 tablet (180 mg total) by mouth daily.  15 tablet  0      Review of Systems    Review of Systems  Constitutional: Negative for fever, appetite change, fatigue and unexpected weight change.  Eyes: Negative for pain and visual disturbance.  Respiratory: Negative for cough and shortness of breath.   Cardiovascular: Negative for cp or palpitations    Gastrointestinal: Negative for nausea, abd pain, constipation, blood in stool.  Genitourinary: Negative for urgency and frequency.  Skin: Negative for pallor or  rash   Neurological: Negative for weakness, light-headedness, numbness and headaches.  Hematological: Negative for adenopathy. Does not bruise/bleed easily.  Psychiatric/Behavioral: Negative for dysphoric mood. The patient is not nervous/anxious.  Objective:   Physical Exam  Constitutional: She appears well-developed and well-nourished. No distress.       obese and well appearing   HENT:  Head: Normocephalic and atraumatic.  Mouth/Throat: Oropharynx is clear and moist.  Eyes: Conjunctivae and EOM are normal. Pupils are equal, round, and reactive to light. No scleral icterus.  Neck: Normal range of motion. Neck supple. No thyromegaly present.  Cardiovascular: Normal rate and regular rhythm.   Pulmonary/Chest: Effort normal and breath sounds normal.  Abdominal: Soft. Bowel sounds are normal. She exhibits no distension and no mass. There is no tenderness. There is no rebound and no guarding.  Musculoskeletal: She exhibits no edema.  Lymphadenopathy:    She has no cervical adenopathy.  Neurological: She is alert. She displays no tremor.  Skin: Skin is warm and dry. No rash noted.  Psychiatric: She has a normal mood and affect.       Mild to mod MR- repeats herself frequently          Assessment & Plan:

## 2011-08-26 NOTE — Assessment & Plan Note (Signed)
Long discussion about this again -intermittent diarrhea (esp with abx) or constipation  Now using metamucil  Seems better overall  Told pt to use metamucil only if it helps -- unsure how well she understands this - put it plainly in AVS Update if not starting to improve in a week or if worsening  -esp if any abd pain or worse symptoms

## 2011-08-27 ENCOUNTER — Telehealth: Payer: Self-pay | Admitting: Family Medicine

## 2011-08-27 ENCOUNTER — Telehealth: Payer: Self-pay

## 2011-08-27 ENCOUNTER — Ambulatory Visit (INDEPENDENT_AMBULATORY_CARE_PROVIDER_SITE_OTHER): Payer: Medicare Other | Admitting: Family Medicine

## 2011-08-27 ENCOUNTER — Encounter: Payer: Self-pay | Admitting: Family Medicine

## 2011-08-27 VITALS — BP 122/76 | HR 89 | Temp 97.9°F | Ht 62.0 in | Wt 193.0 lb

## 2011-08-27 DIAGNOSIS — R197 Diarrhea, unspecified: Secondary | ICD-10-CM

## 2011-08-27 NOTE — Telephone Encounter (Signed)
Triage Record Num: [id number] Operator: [name] Patient Name: Linda Thompson Call Date & Time: 08/27/2011 1:34:38AM Patient Phone: [phone] PCP: Audrie Gallus. Tower Patient Gender: Female PCP Fax : Patient DOB: [birth year] Practice Name: [facility] Reason for Call: Caller: [name]; PCP: [name]; CB#: [phone]; Call regarding Diarrhea; Caller states she started with diarrhea on 08/26/11. Caller states she is on Metamucil. Caller states she started the Metamucil on 08/18/11. Caller states she was on antibiotics 3 weeks ago. Triaged per diarrhea or other change in bowel habits guideline. Home care advice given. Caller asked if she should go to the office in the morning to be worked in. Advised caller to call the office first before going. Protocol(s) Used: Diarrhea or Other Change in Bowel Habits Recommended Outcome per Protocol: Provide Home/Self Care Reason for Outcome: All other situations Care Advice: ~ Call provider if diarrhea does not improve after 2 days of home care. ~ SYMPTOM / CONDITION MANAGEMENT ~ CAUTIONS ~ Avoid foods or drinks that cause or worsen symptoms. Diarrheal Care: - Drink 2-3 quarts (2-3 liters) per day of low sugar content fluids, including over the counter oral hydration solution, unless directed otherwise by provider. - If accompanied by vomiting, take the fluids in frequent small sips or suck on ice chips. - Eat easily digested foods (such as bananas, rice, applesauce, toast, cooked cereals, soup, crackers, baked or boiled potato, or baked chicken or [place] without skin). - Do not eat high fiber, high fat, high sugar content foods, or highly seasoned foods. - Do not drink caffeinated or alcoholic beverages. - Avoid milk and milk products while having symptoms. As symptoms improve, gradually add back to diet. - Application of A&D ointment or witch hazel medicated pads may help anal irritation. - Antidiarrheal medications are usually  unnecessary. If symptoms are severe, consider nonprescription antidiarrheal and anti-motility drugs as directed by label or a provider. Do not take if have high fever or bloody diarrhea. If pregnant, do not take any medications not approved by your provider. - Consult your provider for advice regarding continuing prescription medication. ~

## 2011-08-27 NOTE — Telephone Encounter (Signed)
Pt already has appt scheduled with Dr Milinda Antis today. Pt wants to know what she can eat until seen.(pt states has diarrhea).Advised pt to eat BRAT diet, drink plenty of water and not to take Metamucil until sees Dr Milinda Antis this afternoon.Pt asked what she should do if gets worse before appt. I advised pt to call office if needed before appt.Pt verbalized understanding.

## 2011-08-27 NOTE — Telephone Encounter (Signed)
Triage Record Num: [id number] Operator: Donnella Sham Patient Name: Linda Thompson Call Date & Time: 08/26/2011 11:07:46PM Patient Phone: [phone] PCP: Audrie Gallus. Tower Patient Gender: Female PCP Fax : Patient DOB: Aug 10, 1955 Practice Name: [facility] Reason for Call: Caller: [name]/Patient; PCP: [name]; CB#: [phone]; Call regarding Diarrhea; had one loose stool today; has been taking Metamucil since last wk; last appt 08/26/11 with Dr.Tower; afebrile; denies pain; All emergent symptoms of Diarrhea or Other Change in Bowel Habits protocol ruled out; informed [name] that one loose stool did not define diarrhea, but if she was concerned about the medication, that she should call the office in the am and see if Dr.Tower wanted her to continue it Protocol(s) Used: Diarrhea or Other Change in Bowel Habits Recommended Outcome per Protocol: Provide Home/Self Care Reason for Outcome: All other situations Care Advice: ~ Call provider if diarrhea does not improve after 2 days of home care. ~ SYMPTOM / CONDITION MANAGEMENT ~ Avoid foods or drinks that cause or worsen symptoms. Diarrheal Care: - Drink 2-3 quarts (2-3 liters) per day of low sugar content fluids, including over the counter oral hydration solution, unless directed otherwise by provider. - If accompanied by vomiting, take the fluids in frequent small sips or suck on ice chips. - Eat easily digested foods (such as bananas, rice, applesauce, toast, cooked cereals, soup, crackers, baked or boiled potato, or baked chicken or [place] without skin). - Do not eat high fiber, high fat, high sugar content foods, or highly seasoned foods. - Do not drink caffeinated or alcoholic beverages. - Avoid milk and milk products while having symptoms. As symptoms improve, gradually add back to diet. - Application of A&D ointment or witch hazel medicated pads may help anal irritation. - Antidiarrheal medications are usually  unnecessary. If symptoms are severe, consider nonprescription antidiarrheal and anti-motility drugs as directed by label or a provider. Do not take if have high fever or bloody diarrhea. If pregnant, do not take any medications not approved by your provider. - Consult your provider for advice regarding continuing prescription medication. ~

## 2011-08-27 NOTE — Patient Instructions (Addendum)
Stop metamucil Eat a regular diet  You may still have loose stool for a few days  If worse- please call - or if fever or abdominal pain or other symptoms We are doing a lab test for C diff and will let you know when result returns

## 2011-08-27 NOTE — Assessment & Plan Note (Signed)
I suspect from IBS and use of metamucil after large fruit/ veg meal , however in light of recent abx (zpak) - will check c diff test  See avs for inst for pt  Will stop the fiber suppl and update if no imp

## 2011-08-27 NOTE — Telephone Encounter (Signed)
I saw her yesterday and told her to take the metamucil only if she thinks it is helping - not sure if she understood  Since she is still having loose stools with the metamucil then stop it  Update if not improved

## 2011-08-27 NOTE — Progress Notes (Signed)
Subjective:    Patient ID: Linda Thompson, female    DOB: Nov 11, 1955, 56 y.o.   MRN: [id number]  HPI Pt here again for diarrhea  She was here yesterday and we disc pros and cons of fiber supplement for IBS  She thought it was helping but decided to take her metamucil at night   Last eve- went to a church event - ate salad and cantelope and pimento cheese sandwhich  She ended up with diarrhea that started after 9 pm  Was loose stool No blood in it  She called the nurse line 2 times  Did not take any metamucil today  One more loose stool today  As noted at last visit- had been on zithromax last week   No fever No abd pain No n/v or other symptoms   Patient Active Problem List  Diagnosis  . FUNGAL DERMATITIS  . MENINGIOMA  . DIABETES MELLITUS, TYPE II  . HYPERLIPIDEMIA  . MENTAL RETARDATION, MODERATE  . GLAUCOMA NOS  . HYPERTENSION  . ALLERGIC  RHINITIS  . GERD  . HIATAL HERNIA  . DERMATITIS, SEBORRHEIC NOS  . PRURITUS ANI  . OSTEOARTHRITIS, FOOT  . HEART MURMUR  . TRANSAMINASES, SERUM, ELEVATED  . Personal history of other musculoskeletal disorders  . IRRITABLE BOWEL SYNDROME  . Hypokalemia  . Back pain  . Candidal intertrigo  . Obesity  . Tinea pedis  . Insect bites  . Acute bronchitis, bacterial  . Viral sore throat  . Abdominal  pain, other specified site   Past Medical History  Diagnosis Date  . Allergic rhinitis, cause unspecified   . Pain in joint, ankle and foot   . Backache, unspecified     chronic LBP with radiculopathy  . Seborrheic dermatitis, unspecified   . Type II or unspecified type diabetes mellitus without mention of complication, not stated as uncontrolled   . History of fracture of arm     Right  . History of fracture of foot   . Dermatomycosis, unspecified   . Esophageal reflux   . Unspecified glaucoma   . Undiagnosed cardiac murmurs   . Diaphragmatic hernia without mention of obstruction or gangrene   . Other and unspecified  hyperlipidemia   . Unspecified essential hypertension   . Irritable bowel syndrome   . Benign neoplasm of cerebral meninges   . Moderate intellectual disabilities   . Nausea alone   . Osteoarthrosis, unspecified whether generalized or localized, ankle and foot   . Pruritus ani   . Unspecified tinnitus   . Nonspecific elevation of levels of transaminase or lactic acid dehydrogenase (LDH)   . Leukorrhea, not specified as infective   . Obesity   . Hyperglycemia   . DDD (degenerative disc disease), lumbar    Past Surgical History  Procedure Date  . Total abdominal hysterectomy   . Cataract extraction   . Elbow surgery     right elbow   History  Substance Use Topics  . Smoking status: Never Smoker   . Smokeless tobacco: Never Used  . Alcohol Use: No   Family History  Problem Relation Age of Onset  . Diabetes Mother   . Diabetes      Aunt  . Stroke      Aunt (had pacemaker)   Allergies  Allergen Reactions  . Ace Inhibitors     REACTION: COUGH  . Amoxicillin-Pot Clavulanate     REACTION: diarrhea  . Amoxicillin-Pot Clavulanate Other (See Comments)    Pt  unaware of reaction to augmentin  . Cyclobenzaprine Hcl     REACTION: diarrhea  . Flonase (Fluticasone Propionate)     nosebleed  . Ibuprofen     REACTION: vomiting   Current Outpatient Prescriptions on File Prior to Visit  Medication Sig Dispense Refill  . albuterol (PROVENTIL HFA;VENTOLIN HFA) 108 (90 BASE) MCG/ACT inhaler Inhale 1-2 puffs into the lungs every 6 (six) hours as needed for wheezing.  1 Inhaler  0  . aspirin 81 MG tablet Take 81 mg by mouth daily.        Marland Kitchen diltiazem (CARDIZEM CD) 240 MG 24 hr capsule Take 240 mg by mouth daily.      Marland Kitchen gabapentin (NEURONTIN) 100 MG capsule Take one capsule by mouth at bedtime for one week then one twice a day for one week then one capsule three times a day      . glycopyrrolate (ROBINUL) 2 MG tablet Take one tab 3 times a day as needed for abdominal pain  30 tablet  1    . guaiFENesin (MUCINEX) 600 MG 12 hr tablet Take 1,200 mg by mouth 2 (two) times daily.      . hydrochlorothiazide (MICROZIDE) 12.5 MG capsule Take 12.5 mg by mouth daily.      Marland Kitchen ketoconazole (NIZORAL) 2 % cream Apply topically daily. As needed to affected area  60 g  3  . ketoconazole (NIZORAL) 2 % shampoo Apply 1 application topically 2 (two) times a week.      Marland Kitchen KLOR-CON M20 20 MEQ tablet TAKE 1 TABLET BY MOUTH DAILY  30 tablet  6  . latanoprost (XALATAN) 0.005 % ophthalmic solution       . loratadine (CLARITIN) 10 MG tablet Take 10 mg by mouth daily.      . metFORMIN (GLUCOPHAGE) 500 MG tablet Take 1 tablet (500 mg total) by mouth 2 (two) times daily with a meal.  60 tablet  11  . nitrofurantoin (MACRODANTIN) 100 MG capsule Take 100 mg by mouth 2 (two) times daily.      Marland Kitchen nystatin (MYCOSTATIN) powder Apply 1 Units topically 3 (three) times daily.      Marland Kitchen nystatin (NYSTOP) 100000 UNIT/GM POWD APPLY TO AFFECTED AREA 3 TIMES DAILY  30 g  3  . omeprazole (PRILOSEC) 20 MG capsule Take 1 capsule (20 mg total) by mouth 2 (two) times daily.  60 capsule  11  . potassium chloride SA (K-DUR,KLOR-CON) 20 MEQ tablet Take 20 mEq by mouth daily.      . pravastatin (PRAVACHOL) 40 MG tablet Take 1 tablet (40 mg total) by mouth every evening.  30 tablet  11  . solifenacin (VESICARE) 5 MG tablet Take 1 tablet (5 mg total) by mouth daily.  30 tablet  1  . DISCONTD: Calcium Carbonate-Vitamin D (CALCIUM-VITAMIN D) 500-200 MG-UNIT per tablet Take 1 tablet by mouth 2 (two) times daily with a meal.        . DISCONTD: fexofenadine (ALLEGRA) 180 MG tablet Take 1 tablet (180 mg total) by mouth daily.  15 tablet  0        Review of Systems Review of Systems  Constitutional: Negative for fever, appetite change, fatigue and unexpected weight change.  Eyes: Negative for pain and visual disturbance.  Respiratory: Negative for cough and shortness of breath.   Cardiovascular: Negative for cp or palpitations     Gastrointestinal: Negative for nausea, abd pain, constipation, blood in stool  Genitourinary: Negative for urgency and frequency.  Skin:  Negative for pallor or rash   Neurological: Negative for weakness, light-headedness, numbness and headaches.  Hematological: Negative for adenopathy. Does not bruise/bleed easily.  Psychiatric/Behavioral: Negative for dysphoric mood. The patient is not nervous/anxious.         Objective:   Physical Exam  Constitutional: She appears well-developed and well-nourished. No distress.       obese and well appearing   HENT:  Head: Normocephalic and atraumatic.  Mouth/Throat: Oropharynx is clear and moist.  Eyes: Conjunctivae and EOM are normal. Pupils are equal, round, and reactive to light. No scleral icterus.  Neck: Neck supple. No thyromegaly present.  Cardiovascular: Normal rate.   Pulmonary/Chest: Effort normal and breath sounds normal. No respiratory distress. She has no wheezes.  Abdominal: Soft. Bowel sounds are normal. She exhibits no distension and no mass. There is no tenderness. There is no rebound and no guarding.  Lymphadenopathy:    She has no cervical adenopathy.  Neurological: She is alert. She has normal reflexes.       No tremor   Skin: Skin is warm and dry. No rash noted. No erythema. No pallor.       Brisk capillary refil and good turgor-no signs of dehydration  Psychiatric: She has a normal mood and affect.       Is mild to mod MR and repeats herself frequently          Assessment & Plan:

## 2011-08-27 NOTE — Telephone Encounter (Signed)
Aware. 

## 2011-08-28 DIAGNOSIS — R197 Diarrhea, unspecified: Secondary | ICD-10-CM | POA: Diagnosis not present

## 2011-08-29 ENCOUNTER — Telehealth: Payer: Self-pay

## 2011-08-29 NOTE — Telephone Encounter (Signed)
Tell her I'm glad she is feeling better and hope the diarrhea does not come back Thanks

## 2011-08-29 NOTE — Telephone Encounter (Signed)
c diff test is negative - please give me an update of her symptoms

## 2011-08-29 NOTE — Telephone Encounter (Signed)
Patient notified as instructed. 

## 2011-08-29 NOTE — Telephone Encounter (Signed)
Patient stated she is ok. She is not taking the metamucil. No diarrhea.

## 2011-08-29 NOTE — Telephone Encounter (Signed)
Pt request call back on stool test results. If med needed please send inexpensive med to [facility].Please advise.

## 2011-09-01 ENCOUNTER — Telehealth: Payer: Self-pay

## 2011-09-01 NOTE — Telephone Encounter (Signed)
Don't take anything for now -- lets see how she does for the next 2-3 days eating what she wants to and see if this stabilizes  If worse or fever or abd pain , let me know

## 2011-09-01 NOTE — Telephone Encounter (Signed)
Pt walked in this morning; after speaking with CAN on 08/31/11 pt had 2 loose BMs at 4pm on 08/31/11. Pt has not had BM today. Pt said neighbor told her to drink diet gingerale and take Pepto Bismol. Pt has not taken Pepto yet. Pt wants to know if should take Loperamide that Dr Milinda Antis recommended if has loose BMs. Advised pt she should not take Pepto Bismol and Loperamide together; and only take Loperamide if diarrhea. Pt said she has not taken any Metamucil since Dr Milinda Antis told her to stop taking. Pt wants to know if she can eat cheesburger macaroni and macaroni with sharp cheese (both frozen dishes).Please advise.

## 2011-09-01 NOTE — Telephone Encounter (Signed)
Triage Record Num: [id number] Operator: Tomasita Crumble Patient Name: Shannin Naab Call Date & Time: 08/31/2011 4:01:20PM Patient Phone: [phone] PCP: Audrie Gallus. Tower Patient Gender: Female PCP Fax : Patient DOB: [birth year] Practice Name: [facility] Reason for Call: Caller: Elisama/Patient; PCP: [name]; CB#: [phone]; Call regarding seen by Dr. Milinda Antis twice within past week. She related various information about recent visits in past week and phone calls. Her bowels moved "normal" on 08/31/11. She asks, "Do you think my bowels are getting straightened out?". Reviewed EMR and advised since she is having normal stools and sx have abated that it would stand to reason that her bowels are getting straightened out. She asked if she can drink diet ginger ale. Advised that some diet ginger ale is reasonable to help with mild GI upset. Caller rambled about what her neighbor told her to do. Advised caller that she should follow MD instructions, not rely on neighbors for medical advice. Protocol(s) Used: PCP Calls, No Triage (Adult) Recommended Outcome per Protocol: Provide Information or Advice Only Reason for Outcome: [1] Other nonurgent information for PCP AND [2] does not require PCP response Care Advice: ~

## 2011-09-01 NOTE — Telephone Encounter (Signed)
Patient advised.

## 2011-09-03 ENCOUNTER — Telehealth: Payer: Self-pay

## 2011-09-03 ENCOUNTER — Encounter: Payer: Self-pay | Admitting: Gastroenterology

## 2011-09-03 DIAGNOSIS — R197 Diarrhea, unspecified: Secondary | ICD-10-CM

## 2011-09-03 DIAGNOSIS — K589 Irritable bowel syndrome without diarrhea: Secondary | ICD-10-CM

## 2011-09-03 NOTE — Telephone Encounter (Signed)
Called patient to give her the GI Appt with Dr Arlyce Dice for Sepy 18th at 1:30pm. Advised patient to bring a friend and she said she will.

## 2011-09-03 NOTE — Telephone Encounter (Signed)
Let her know I am doing referral now and she will get a call  Let her know she should take a friend or neighbor with her to that appt

## 2011-09-03 NOTE — Telephone Encounter (Signed)
Since pt still having loose BM and going to GI doctor is it OK for pt to start to take Loperamide again if has loose BMs.[facility].Please advise.

## 2011-09-03 NOTE — Telephone Encounter (Signed)
Yes- she can take that as needed -especially if she needs to go somewhere

## 2011-09-03 NOTE — Telephone Encounter (Signed)
09/02/11 pt ate grilled chicken salad at lunch; last night had normal BM and 2 loose BMs. Pt took [employer]. No BM today but pt is afraid to go anywhere for fear of loose BM and pt will not have access to restroom. Pt request referral to specialist.

## 2011-09-04 NOTE — Telephone Encounter (Signed)
Patient informed. 

## 2011-09-08 ENCOUNTER — Ambulatory Visit (INDEPENDENT_AMBULATORY_CARE_PROVIDER_SITE_OTHER): Payer: Medicare Other | Admitting: Family Medicine

## 2011-09-08 ENCOUNTER — Telehealth: Payer: Self-pay | Admitting: Family Medicine

## 2011-09-08 ENCOUNTER — Encounter: Payer: Self-pay | Admitting: Family Medicine

## 2011-09-08 VITALS — BP 128/76 | HR 96 | Temp 97.9°F | Ht 62.0 in | Wt 193.2 lb

## 2011-09-08 DIAGNOSIS — K219 Gastro-esophageal reflux disease without esophagitis: Secondary | ICD-10-CM

## 2011-09-08 NOTE — Telephone Encounter (Signed)
Triage Record Num: [id number] Operator: Tarri Glenn Patient Name: Linda Thompson Call Date & Time: 09/06/2011 7:08:17PM Patient Phone: [phone] PCP: Audrie Gallus. Tower Patient Gender: Female PCP Fax : Patient DOB: 1955-10-20 Practice Name: [facility] Reason for Call: Caller: Jovanni/Patient; PCP: [name]; CB#: [phone]; Patient is calling about patient was seen in office 09/02/11 and taken off Metamucil. Patient ate some chicken with red peppers and had some burning. All emergent signs and symptoms ruled out per Heartburn protocol. Home care advice given. Protocol(s) Used: Heartburn Recommended Outcome per Protocol: Provide Home/Self Care Reason for Outcome: Burning sensation in epigastric area that may move up chest into neck or jaw without other symptoms AND is not related to exertional activity Care Advice: Heartburn Relief (Dietary): - Avoid overeating. - Eat smaller, more frequent meals and chew each bite thoroughly. - Avoid high fat, spicy or gas-producing foods. - Limit liquids/foods that are gastric irritants (fruit juices, caffeinated, carbonated or alcoholic beverages, chocolate and dairy products). - Eat at least three hours before bedtime. ~ Heartburn Relief (Positioning): - Avoid bending over at the waist or lying flat soon after eating. - Avoid clothing that is tight around the abdomen and waist. - Sleeping on stacked pillows or raising the head of bed on 6 inch blocks may help prevent reflux. ~ 09/06/2011 7:21:50PM Page 1 of 1 [email or web address]

## 2011-09-08 NOTE — Patient Instructions (Addendum)
I think you ate something that aggravated your acid reflux (heartburn) Stay away from spicy foods / onions/peppers  Do not drink a lot of milk - this gave you some diarrhea Keep taking prilosec daily Keep some TUMS on hand (those are over the counter)- chew 2 of them as needed if you get heartburn If worse - let me know  Please check in with Shirlee Limerick or Asher Muir on the way out about your [facility] visits

## 2011-09-08 NOTE — Progress Notes (Signed)
Subjective:    Patient ID: Linda Thompson, female    DOB: 08-14-1955, 56 y.o.   MRN: [id number]  HPI  Over the weekend ate some spicy foods  - had some red sauce and peas- frozen meal/ was spicy Then had burning in her throat and chest- heartburn- she called the nurse line and said she was inst to drink water and milk  Then this am had some loose stool (does not usually drink milk- wondered it it was intol to that) No blood in her stool  Had 3 loose bms in total  Has hx of IBS - and has appt with GI in sept for that -multiple visits with that   No nausea or vomiting  Feels fine now  No fever No abd pain   Has not missed any doses of prilosec at all   She wondered also if [facility] was supposed to come back -last there in May  Patient Active Problem List  Diagnosis  . FUNGAL DERMATITIS  . MENINGIOMA  . DIABETES MELLITUS, TYPE II  . HYPERLIPIDEMIA  . MENTAL RETARDATION, MODERATE  . GLAUCOMA NOS  . HYPERTENSION  . ALLERGIC  RHINITIS  . GERD  . HIATAL HERNIA  . DERMATITIS, SEBORRHEIC NOS  . PRURITUS ANI  . OSTEOARTHRITIS, FOOT  . HEART MURMUR  . TRANSAMINASES, SERUM, ELEVATED  . Personal history of other musculoskeletal disorders  . IRRITABLE BOWEL SYNDROME  . Hypokalemia  . Back pain  . Candidal intertrigo  . Obesity  . Tinea pedis  . Insect bites  . Acute bronchitis, bacterial  . Viral sore throat  . Abdominal  pain, other specified site  . Diarrhea   Past Medical History  Diagnosis Date  . Allergic rhinitis, cause unspecified   . Pain in joint, ankle and foot   . Backache, unspecified     chronic LBP with radiculopathy  . Seborrheic dermatitis, unspecified   . Type II or unspecified type diabetes mellitus without mention of complication, not stated as uncontrolled   . History of fracture of arm     Right  . History of fracture of foot   . Dermatomycosis, unspecified   . Esophageal reflux   . Unspecified glaucoma   . Undiagnosed cardiac murmurs   .  Diaphragmatic hernia without mention of obstruction or gangrene   . Other and unspecified hyperlipidemia   . Unspecified essential hypertension   . Irritable bowel syndrome   . Benign neoplasm of cerebral meninges   . Moderate intellectual disabilities   . Nausea alone   . Osteoarthrosis, unspecified whether generalized or localized, ankle and foot   . Pruritus ani   . Unspecified tinnitus   . Nonspecific elevation of levels of transaminase or lactic acid dehydrogenase (LDH)   . Leukorrhea, not specified as infective   . Obesity   . Hyperglycemia   . DDD (degenerative disc disease), lumbar    Past Surgical History  Procedure Date  . Total abdominal hysterectomy   . Cataract extraction   . Elbow surgery     right elbow   History  Substance Use Topics  . Smoking status: Never Smoker   . Smokeless tobacco: Never Used  . Alcohol Use: No   Family History  Problem Relation Age of Onset  . Diabetes Mother   . Diabetes      Aunt  . Stroke      Aunt (had pacemaker)   Allergies  Allergen Reactions  . Ace Inhibitors  REACTION: COUGH  . Amoxicillin-Pot Clavulanate     REACTION: diarrhea  . Amoxicillin-Pot Clavulanate Other (See Comments)    Pt unaware of reaction to augmentin  . Cyclobenzaprine Hcl     REACTION: diarrhea  . Flonase (Fluticasone Propionate)     nosebleed  . Ibuprofen     REACTION: vomiting   Current Outpatient Prescriptions on File Prior to Visit  Medication Sig Dispense Refill  . albuterol (PROVENTIL HFA;VENTOLIN HFA) 108 (90 BASE) MCG/ACT inhaler Inhale 1-2 puffs into the lungs every 6 (six) hours as needed for wheezing.  1 Inhaler  0  . aspirin 81 MG tablet Take 81 mg by mouth daily.        Marland Kitchen diltiazem (CARDIZEM CD) 240 MG 24 hr capsule Take 240 mg by mouth daily.      Marland Kitchen gabapentin (NEURONTIN) 100 MG capsule Take one capsule by mouth at bedtime for one week then one twice a day for one week then one capsule three times a day      . glycopyrrolate  (ROBINUL) 2 MG tablet Take one tab 3 times a day as needed for abdominal pain  30 tablet  1  . guaiFENesin (MUCINEX) 600 MG 12 hr tablet Take 1,200 mg by mouth 2 (two) times daily.      . hydrochlorothiazide (MICROZIDE) 12.5 MG capsule Take 12.5 mg by mouth daily.      Marland Kitchen ketoconazole (NIZORAL) 2 % cream Apply topically daily. As needed to affected area  60 g  3  . ketoconazole (NIZORAL) 2 % shampoo Apply 1 application topically 2 (two) times a week.      Marland Kitchen KLOR-CON M20 20 MEQ tablet TAKE 1 TABLET BY MOUTH DAILY  30 tablet  6  . latanoprost (XALATAN) 0.005 % ophthalmic solution       . loratadine (CLARITIN) 10 MG tablet Take 10 mg by mouth daily.      . metFORMIN (GLUCOPHAGE) 500 MG tablet Take 1 tablet (500 mg total) by mouth 2 (two) times daily with a meal.  60 tablet  11  . nitrofurantoin (MACRODANTIN) 100 MG capsule Take 100 mg by mouth 2 (two) times daily.      Marland Kitchen nystatin (MYCOSTATIN) powder Apply 1 Units topically 3 (three) times daily.      Marland Kitchen nystatin (NYSTOP) 100000 UNIT/GM POWD APPLY TO AFFECTED AREA 3 TIMES DAILY  30 g  3  . omeprazole (PRILOSEC) 20 MG capsule Take 1 capsule (20 mg total) by mouth 2 (two) times daily.  60 capsule  11  . potassium chloride SA (K-DUR,KLOR-CON) 20 MEQ tablet Take 20 mEq by mouth daily.      . pravastatin (PRAVACHOL) 40 MG tablet Take 1 tablet (40 mg total) by mouth every evening.  30 tablet  11  . solifenacin (VESICARE) 5 MG tablet Take 1 tablet (5 mg total) by mouth daily.  30 tablet  1  . DISCONTD: Calcium Carbonate-Vitamin D (CALCIUM-VITAMIN D) 500-200 MG-UNIT per tablet Take 1 tablet by mouth 2 (two) times daily with a meal.        . DISCONTD: fexofenadine (ALLEGRA) 180 MG tablet Take 1 tablet (180 mg total) by mouth daily.  15 tablet  0         Patient Active Problem List  Diagnosis  . FUNGAL DERMATITIS  . MENINGIOMA  . DIABETES MELLITUS, TYPE II  . HYPERLIPIDEMIA  . MENTAL RETARDATION, MODERATE  . GLAUCOMA NOS  . HYPERTENSION  . ALLERGIC   RHINITIS  . GERD  .  HIATAL HERNIA  . DERMATITIS, SEBORRHEIC NOS  . PRURITUS ANI  . OSTEOARTHRITIS, FOOT  . HEART MURMUR  . TRANSAMINASES, SERUM, ELEVATED  . Personal history of other musculoskeletal disorders  . IRRITABLE BOWEL SYNDROME  . Hypokalemia  . Back pain  . Candidal intertrigo  . Obesity  . Tinea pedis  . Insect bites  . Acute bronchitis, bacterial  . Viral sore throat  . Abdominal  pain, other specified site  . Diarrhea   Past Medical History  Diagnosis Date  . Allergic rhinitis, cause unspecified   . Pain in joint, ankle and foot   . Backache, unspecified     chronic LBP with radiculopathy  . Seborrheic dermatitis, unspecified   . Type II or unspecified type diabetes mellitus without mention of complication, not stated as uncontrolled   . History of fracture of arm     Right  . History of fracture of foot   . Dermatomycosis, unspecified   . Esophageal reflux   . Unspecified glaucoma   . Undiagnosed cardiac murmurs   . Diaphragmatic hernia without mention of obstruction or gangrene   . Other and unspecified hyperlipidemia   . Unspecified essential hypertension   . Irritable bowel syndrome   . Benign neoplasm of cerebral meninges   . Moderate intellectual disabilities   . Nausea alone   . Osteoarthrosis, unspecified whether generalized or localized, ankle and foot   . Pruritus ani   . Unspecified tinnitus   . Nonspecific elevation of levels of transaminase or lactic acid dehydrogenase (LDH)   . Leukorrhea, not specified as infective   . Obesity   . Hyperglycemia   . DDD (degenerative disc disease), lumbar    Past Surgical History  Procedure Date  . Total abdominal hysterectomy   . Cataract extraction   . Elbow surgery     right elbow   History  Substance Use Topics  . Smoking status: Never Smoker   . Smokeless tobacco: Never Used  . Alcohol Use: No   Family History  Problem Relation Age of Onset  . Diabetes Mother   . Diabetes      Aunt    . Stroke      Aunt (had pacemaker)   Allergies  Allergen Reactions  . Ace Inhibitors     REACTION: COUGH  . Amoxicillin-Pot Clavulanate     REACTION: diarrhea  . Amoxicillin-Pot Clavulanate Other (See Comments)    Pt unaware of reaction to augmentin  . Cyclobenzaprine Hcl     REACTION: diarrhea  . Flonase (Fluticasone Propionate)     nosebleed  . Ibuprofen     REACTION: vomiting   Current Outpatient Prescriptions on File Prior to Visit  Medication Sig Dispense Refill  . albuterol (PROVENTIL HFA;VENTOLIN HFA) 108 (90 BASE) MCG/ACT inhaler Inhale 1-2 puffs into the lungs every 6 (six) hours as needed for wheezing.  1 Inhaler  0  . aspirin 81 MG tablet Take 81 mg by mouth daily.        Marland Kitchen diltiazem (CARDIZEM CD) 240 MG 24 hr capsule Take 240 mg by mouth daily.      Marland Kitchen gabapentin (NEURONTIN) 100 MG capsule Take one capsule by mouth at bedtime for one week then one twice a day for one week then one capsule three times a day      . glycopyrrolate (ROBINUL) 2 MG tablet Take one tab 3 times a day as needed for abdominal pain  30 tablet  1  . guaiFENesin (MUCINEX) 600  MG 12 hr tablet Take 1,200 mg by mouth 2 (two) times daily.      . hydrochlorothiazide (MICROZIDE) 12.5 MG capsule Take 12.5 mg by mouth daily.      Marland Kitchen ketoconazole (NIZORAL) 2 % cream Apply topically daily. As needed to affected area  60 g  3  . ketoconazole (NIZORAL) 2 % shampoo Apply 1 application topically 2 (two) times a week.      Marland Kitchen KLOR-CON M20 20 MEQ tablet TAKE 1 TABLET BY MOUTH DAILY  30 tablet  6  . latanoprost (XALATAN) 0.005 % ophthalmic solution       . loratadine (CLARITIN) 10 MG tablet Take 10 mg by mouth daily.      . metFORMIN (GLUCOPHAGE) 500 MG tablet Take 1 tablet (500 mg total) by mouth 2 (two) times daily with a meal.  60 tablet  11  . nitrofurantoin (MACRODANTIN) 100 MG capsule Take 100 mg by mouth 2 (two) times daily.      Marland Kitchen nystatin (MYCOSTATIN) powder Apply 1 Units topically 3 (three) times daily.       Marland Kitchen nystatin (NYSTOP) 100000 UNIT/GM POWD APPLY TO AFFECTED AREA 3 TIMES DAILY  30 g  3  . omeprazole (PRILOSEC) 20 MG capsule Take 1 capsule (20 mg total) by mouth 2 (two) times daily.  60 capsule  11  . potassium chloride SA (K-DUR,KLOR-CON) 20 MEQ tablet Take 20 mEq by mouth daily.      . pravastatin (PRAVACHOL) 40 MG tablet Take 1 tablet (40 mg total) by mouth every evening.  30 tablet  11  . solifenacin (VESICARE) 5 MG tablet Take 1 tablet (5 mg total) by mouth daily.  30 tablet  1  . DISCONTD: Calcium Carbonate-Vitamin D (CALCIUM-VITAMIN D) 500-200 MG-UNIT per tablet Take 1 tablet by mouth 2 (two) times daily with a meal.        . DISCONTD: fexofenadine (ALLEGRA) 180 MG tablet Take 1 tablet (180 mg total) by mouth daily.  15 tablet  0     Review of Systems Review of Systems  Constitutional: Negative for fever, appetite change, fatigue and unexpected weight change.  Eyes: Negative for pain and visual disturbance.  Respiratory: Negative for cough and shortness of breath.   Cardiovascular: Negative for cp or palpitations    Gastrointestinal: Negative for nausea, vomiting/ abdominal pain or blood in stool/ dark stool  Genitourinary: Negative for urgency and frequency.  Skin: Negative for pallor or rash   Neurological: Negative for weakness, light-headedness, numbness and headaches.  Hematological: Negative for adenopathy. Does not bruise/bleed easily.  Psychiatric/Behavioral: Negative for dysphoric mood. The patient is not nervous/anxious.         Objective:   Physical Exam  Constitutional: She appears well-developed and well-nourished. No distress.       obese and well appearing   HENT:  Head: Normocephalic and atraumatic.  Eyes: Conjunctivae and EOM are normal. Pupils are equal, round, and reactive to light. No scleral icterus.  Neck: Normal range of motion. Neck supple. No thyromegaly present.  Cardiovascular: Normal rate and regular rhythm.   Pulmonary/Chest: Effort normal and  breath sounds normal.  Abdominal: Soft. Bowel sounds are normal. She exhibits no distension and no mass. There is no tenderness. There is no rebound and no guarding.       Obese abd Neg murphy sign nontender  Musculoskeletal: She exhibits no tenderness.  Lymphadenopathy:    She has no cervical adenopathy.  Neurological: She is alert.  Skin: Skin is warm  and dry. No rash noted. No erythema. No pallor.  Psychiatric: She has a normal mood and affect.       Baseline MR Repeats herself frequently          Assessment & Plan:

## 2011-09-08 NOTE — Assessment & Plan Note (Addendum)
This got worse after eating a dish with onion/ pepper/ ;tomato- she thinks Has not had problems in a while No missed doses of prilosec --told her to continue it and not miss doses  She will update [place] if her symptoms return or worsen

## 2011-09-08 NOTE — Telephone Encounter (Signed)
Pt seen 08/26/11.

## 2011-09-09 ENCOUNTER — Telehealth: Payer: Self-pay | Admitting: Family Medicine

## 2011-09-09 ENCOUNTER — Telehealth: Payer: Self-pay

## 2011-09-09 NOTE — Telephone Encounter (Signed)
Go ahead and try the amilactin, follow up if not improving in a week or if worse

## 2011-09-09 NOTE — Telephone Encounter (Signed)
Spoke with [facility] Marcelino Duster) About why no one followed up with Mrs. Linda Thompson. She was saying that She will talk to Selena Batten the nurse assigned to her and have her do a follow up. I did say that some sort of social work would benefit [name] if they could refer her somewhere like that or help her out.

## 2011-09-09 NOTE — Telephone Encounter (Signed)
Pt left v/m left hand peeling off and on for 2 months. Pt has Amloctin lotion OTC and helped in past is it OK for pt use or should pt be seen? 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 [facility].

## 2011-09-09 NOTE — Telephone Encounter (Signed)
That would be great -- if anyone wants to speak to me just have them call

## 2011-09-10 NOTE — Telephone Encounter (Signed)
Pt informed as instructed

## 2011-09-11 DIAGNOSIS — Z23 Encounter for immunization: Secondary | ICD-10-CM | POA: Diagnosis not present

## 2011-09-12 ENCOUNTER — Encounter: Payer: Self-pay | Admitting: Family Medicine

## 2011-09-12 ENCOUNTER — Ambulatory Visit (INDEPENDENT_AMBULATORY_CARE_PROVIDER_SITE_OTHER): Payer: Medicare Other | Admitting: Family Medicine

## 2011-09-12 VITALS — BP 122/68 | HR 87 | Temp 97.9°F | Ht 62.0 in | Wt 192.2 lb

## 2011-09-12 DIAGNOSIS — L259 Unspecified contact dermatitis, unspecified cause: Secondary | ICD-10-CM

## 2011-09-12 DIAGNOSIS — M549 Dorsalgia, unspecified: Secondary | ICD-10-CM

## 2011-09-12 DIAGNOSIS — L309 Dermatitis, unspecified: Secondary | ICD-10-CM

## 2011-09-12 NOTE — Assessment & Plan Note (Signed)
Recurrent with deg spine and disc dz Will get her back to Dr Ophelia Charter Injections have helped in the past  Since she confuses easily -no meds were recommended today Enc her to keep walking and stretching

## 2011-09-12 NOTE — Progress Notes (Signed)
Subjective:    Patient ID: RAMA SORCI, female    DOB: [birth year], 56 y.o.   MRN: [id number]  HPI Here with pain in R buttock going down her R leg Thought it may be arthritis Started on Wednesday   No weakness or numbness  Not dragging her foot  This is the leg she uses to drive and when she presses the gas it bothers her back/ buttock a bit   She did call to get appt with Dr Ophelia Charter  They offered her appt sept 6th-- and she could not take that because she has another appt   She has had 2 injections for spinal stenosis type problem in the past   Hands are dry and peeling  Is using hand sanitizer - purell and other brands  Has not tried her amlactin yet as she was advised No burning, but does itch a bit  States she does not pick at it  No redness No drainage or pus  Patient Active Problem List  Diagnosis  . FUNGAL DERMATITIS  . MENINGIOMA  . DIABETES MELLITUS, TYPE II  . HYPERLIPIDEMIA  . MENTAL RETARDATION, MODERATE  . GLAUCOMA NOS  . HYPERTENSION  . ALLERGIC  RHINITIS  . GERD  . HIATAL HERNIA  . DERMATITIS, SEBORRHEIC NOS  . PRURITUS ANI  . OSTEOARTHRITIS, FOOT  . HEART MURMUR  . TRANSAMINASES, SERUM, ELEVATED  . Personal history of other musculoskeletal disorders  . IRRITABLE BOWEL SYNDROME  . Hypokalemia  . Back pain  . Candidal intertrigo  . Obesity  . Tinea pedis  . Insect bites  . Acute bronchitis, bacterial  . Viral sore throat  . Abdominal  pain, other specified site  . Diarrhea   Past Medical History  Diagnosis Date  . Allergic rhinitis, cause unspecified   . Pain in joint, ankle and foot   . Backache, unspecified     chronic LBP with radiculopathy  . Seborrheic dermatitis, unspecified   . Type II or unspecified type diabetes mellitus without mention of complication, not stated as uncontrolled   . History of fracture of arm     Right  . History of fracture of foot   . Dermatomycosis, unspecified   . Esophageal reflux   . Unspecified  glaucoma   . Undiagnosed cardiac murmurs   . Diaphragmatic hernia without mention of obstruction or gangrene   . Other and unspecified hyperlipidemia   . Unspecified essential hypertension   . Irritable bowel syndrome   . Benign neoplasm of cerebral meninges   . Moderate intellectual disabilities   . Nausea alone   . Osteoarthrosis, unspecified whether generalized or localized, ankle and foot   . Pruritus ani   . Unspecified tinnitus   . Nonspecific elevation of levels of transaminase or lactic acid dehydrogenase (LDH)   . Leukorrhea, not specified as infective   . Obesity   . Hyperglycemia   . DDD (degenerative disc disease), lumbar    Past Surgical History  Procedure Date  . Total abdominal hysterectomy   . Cataract extraction   . Elbow surgery     right elbow   History  Substance Use Topics  . Smoking status: Never Smoker   . Smokeless tobacco: Never Used  . Alcohol Use: No   Family History  Problem Relation Age of Onset  . Diabetes Mother   . Diabetes      Aunt  . Stroke      Aunt (had pacemaker)   Allergies  Allergen Reactions  .  Ace Inhibitors     REACTION: COUGH  . Amoxicillin-Pot Clavulanate     REACTION: diarrhea  . Amoxicillin-Pot Clavulanate Other (See Comments)    Pt unaware of reaction to augmentin  . Cyclobenzaprine Hcl     REACTION: diarrhea  . Flonase (Fluticasone Propionate)     nosebleed  . Ibuprofen     REACTION: vomiting   Current Outpatient Prescriptions on File Prior to Visit  Medication Sig Dispense Refill  . albuterol (PROVENTIL HFA;VENTOLIN HFA) 108 (90 BASE) MCG/ACT inhaler Inhale 1-2 puffs into the lungs every 6 (six) hours as needed for wheezing.  1 Inhaler  0  . aspirin 81 MG tablet Take 81 mg by mouth daily.        Marland Kitchen diltiazem (CARDIZEM CD) 240 MG 24 hr capsule Take 240 mg by mouth daily.      Marland Kitchen gabapentin (NEURONTIN) 100 MG capsule Take one capsule by mouth at bedtime for one week then one twice a day for one week then one  capsule three times a day      . glycopyrrolate (ROBINUL) 2 MG tablet Take one tab 3 times a day as needed for abdominal pain  30 tablet  1  . guaiFENesin (MUCINEX) 600 MG 12 hr tablet Take 1,200 mg by mouth 2 (two) times daily.      . hydrochlorothiazide (MICROZIDE) 12.5 MG capsule Take 12.5 mg by mouth daily.      Marland Kitchen ketoconazole (NIZORAL) 2 % cream Apply topically daily. As needed to affected area  60 g  3  . ketoconazole (NIZORAL) 2 % shampoo Apply 1 application topically 2 (two) times a week.      Marland Kitchen KLOR-CON M20 20 MEQ tablet TAKE 1 TABLET BY MOUTH DAILY  30 tablet  6  . latanoprost (XALATAN) 0.005 % ophthalmic solution       . loratadine (CLARITIN) 10 MG tablet Take 10 mg by mouth daily.      . metFORMIN (GLUCOPHAGE) 500 MG tablet Take 1 tablet (500 mg total) by mouth 2 (two) times daily with a meal.  60 tablet  11  . nitrofurantoin (MACRODANTIN) 100 MG capsule Take 100 mg by mouth 2 (two) times daily.      Marland Kitchen nystatin (MYCOSTATIN) powder Apply 1 Units topically 3 (three) times daily.      Marland Kitchen nystatin (NYSTOP) 100000 UNIT/GM POWD APPLY TO AFFECTED AREA 3 TIMES DAILY  30 g  3  . omeprazole (PRILOSEC) 20 MG capsule Take 1 capsule (20 mg total) by mouth 2 (two) times daily.  60 capsule  11  . potassium chloride SA (K-DUR,KLOR-CON) 20 MEQ tablet Take 20 mEq by mouth daily.      . pravastatin (PRAVACHOL) 40 MG tablet Take 1 tablet (40 mg total) by mouth every evening.  30 tablet  11  . solifenacin (VESICARE) 5 MG tablet Take 1 tablet (5 mg total) by mouth daily.  30 tablet  1  . DISCONTD: Calcium Carbonate-Vitamin D (CALCIUM-VITAMIN D) 500-200 MG-UNIT per tablet Take 1 tablet by mouth 2 (two) times daily with a meal.        . DISCONTD: fexofenadine (ALLEGRA) 180 MG tablet Take 1 tablet (180 mg total) by mouth daily.  15 tablet  0    She went ahead and had her flu shot 09/11/10    Review of Systems Review of Systems  Constitutional: Negative for fever, appetite change, fatigue and unexpected  weight change.  Eyes: Negative for pain and visual disturbance.  Respiratory: Negative  for cough and shortness of breath.   Cardiovascular: Negative for cp or palpitations    Gastrointestinal: Negative for nausea, diarrhea and constipation.  Genitourinary: Negative for urgency and frequency.  Skin: Negative for pallor or rash  pos for dry skin and peeling on hands  MSK pos for back pain, neg for joint pain or swelling  Neurological: Negative for weakness, light-headedness, numbness and headaches.  Hematological: Negative for adenopathy. Does not bruise/bleed easily.  Psychiatric/Behavioral: Negative for dysphoric mood. The patient is not nervous/anxious.         Objective:   Physical Exam  Constitutional: She appears well-developed and well-nourished. No distress.       obese and well appearing    HENT:  Head: Normocephalic and atraumatic.  Eyes: Conjunctivae and EOM are normal. Pupils are equal, round, and reactive to light. No scleral icterus.  Neck: Normal range of motion. Neck supple. No JVD present. No thyromegaly present.  Cardiovascular: Normal rate and regular rhythm.   Pulmonary/Chest: Effort normal and breath sounds normal.  Abdominal: Soft. Bowel sounds are normal.       No suprapubic tenderness or fullness    Musculoskeletal: She exhibits tenderness. She exhibits no edema.       Lumbar back: She exhibits decreased range of motion, tenderness and spasm. She exhibits no bony tenderness, no edema and no deformity.       R perispinal muscular/ piriformis area tenderness Neg SLR today  No neurol changes Nl gait   Lymphadenopathy:    She has no cervical adenopathy.  Neurological: She is alert. She has normal strength and normal reflexes. She displays no atrophy. No cranial nerve deficit or sensory deficit. She exhibits normal muscle tone. Coordination normal.  Skin: Skin is warm and dry. No erythema. No pallor.       Some peeling/ dry skin on L palm in small area  No  redness or plaque formation  Psychiatric: She has a normal mood and affect.       Baseline MR with frequent repetition in speech          Assessment & Plan:

## 2011-09-12 NOTE — Patient Instructions (Addendum)
We will refer you to Dr Ophelia Charter at check out for back pain  Stop using hand sanitizer- it is causing the skin problem on your hands  Use the amlactin lotion on hands 1-2 times per day as needed  Let me know if symptoms worsen

## 2011-09-12 NOTE — Assessment & Plan Note (Signed)
This is very mild and comes and goes - today with small amt of scale on L palm  Adv to stop using hand sanitizer as it is creating more problems (in fact I threw hers away today)  inst to get back to using amlactin 1-2 times per day  Wash hands with gentle soap and water  Will update if no improvement

## 2011-09-13 ENCOUNTER — Encounter (HOSPITAL_COMMUNITY): Payer: Self-pay | Admitting: *Deleted

## 2011-09-13 ENCOUNTER — Emergency Department (HOSPITAL_COMMUNITY)
Admission: EM | Admit: 2011-09-13 | Discharge: 2011-09-13 | Disposition: A | Discharge status: Home / Self Care | Payer: Medicare Other | Attending: Emergency Medicine | Admitting: Emergency Medicine

## 2011-09-13 DIAGNOSIS — Z9109 Other allergy status, other than to drugs and biological substances: Secondary | ICD-10-CM

## 2011-09-13 DIAGNOSIS — R6889 Other general symptoms and signs: Secondary | ICD-10-CM | POA: Diagnosis not present

## 2011-09-13 DIAGNOSIS — R05 Cough: Secondary | ICD-10-CM | POA: Insufficient documentation

## 2011-09-13 DIAGNOSIS — J029 Acute pharyngitis, unspecified: Secondary | ICD-10-CM | POA: Insufficient documentation

## 2011-09-13 DIAGNOSIS — R059 Cough, unspecified: Secondary | ICD-10-CM | POA: Insufficient documentation

## 2011-09-13 DIAGNOSIS — T7840XA Allergy, unspecified, initial encounter: Secondary | ICD-10-CM | POA: Diagnosis not present

## 2011-09-13 MED ORDER — LORATADINE 10 MG PO TABS: 10.0000 mg | ORAL_TABLET | Freq: Every day | ORAL | Status: DC

## 2011-09-13 NOTE — ED Provider Notes (Signed)
Medical screening examination/treatment/procedure(s) were performed by non-physician practitioner and as supervising physician I was immediately available for consultation/collaboration.  Flint Melter, MD 09/13/11 [phone]

## 2011-09-13 NOTE — ED Notes (Signed)
Pt states that she went to her PCP and did not tell him yesterday that she had a sore throat. Pt states that she has had a sore throat for the past couple of days. Pt states she also has a cough.

## 2011-09-13 NOTE — ED Provider Notes (Signed)
History     CSN: [id number]  Arrival date & time 09/13/11  2115   First MD Initiated Contact with Patient 09/13/11 2222      Chief Complaint  Patient presents with  . Sore Throat    (Consider location/radiation/quality/duration/timing/severity/associated sxs/prior treatment) HPI Comments: Patient has noticed, that she has a scratchy throat, increased sneezing, and occasionally will cough.  She was at her primary care physician yesterday and did not mention that time.  She denies any fever, chills  Patient is a 56 y.o. female presenting with pharyngitis.  Sore Throat This is a new problem. The current episode started in the past 7 days. The problem has been unchanged. Associated symptoms include coughing and a sore throat. Pertinent negatives include no abdominal pain, chills, fever, nausea, rash, vomiting or weakness.    Past Medical History  Diagnosis Date  . Allergic rhinitis, cause unspecified   . Pain in joint, ankle and foot   . Backache, unspecified     chronic LBP with radiculopathy  . Seborrheic dermatitis, unspecified   . Type II or unspecified type diabetes mellitus without mention of complication, not stated as uncontrolled   . History of fracture of arm     Right  . History of fracture of foot   . Dermatomycosis, unspecified   . Esophageal reflux   . Unspecified glaucoma   . Undiagnosed cardiac murmurs   . Diaphragmatic hernia without mention of obstruction or gangrene   . Other and unspecified hyperlipidemia   . Unspecified essential hypertension   . Irritable bowel syndrome   . Benign neoplasm of cerebral meninges   . Moderate intellectual disabilities   . Nausea alone   . Osteoarthrosis, unspecified whether generalized or localized, ankle and foot   . Pruritus ani   . Unspecified tinnitus   . Nonspecific elevation of levels of transaminase or lactic acid dehydrogenase (LDH)   . Leukorrhea, not specified as infective   . Obesity   . Hyperglycemia   .  DDD (degenerative disc disease), lumbar     Past Surgical History  Procedure Date  . Total abdominal hysterectomy   . Cataract extraction   . Elbow surgery     right elbow    Family History  Problem Relation Age of Onset  . Diabetes Mother   . Diabetes      Aunt  . Stroke      Aunt (had pacemaker)    History  Substance Use Topics  . Smoking status: Never Smoker   . Smokeless tobacco: Never Used  . Alcohol Use: No    OB History    Grav Para Term Preterm Abortions TAB SAB Ect Mult Living                  Review of Systems  Constitutional: Negative for fever and chills.  HENT: Positive for sore throat. Negative for trouble swallowing.   Respiratory: Positive for cough. Negative for shortness of breath.   Gastrointestinal: Negative for nausea, vomiting and abdominal pain.  Skin: Negative for rash.  Neurological: Negative for weakness.    Allergies  Ace inhibitors; Amoxicillin-pot clavulanate; Amoxicillin-pot clavulanate; Cyclobenzaprine hcl; Flonase; and Ibuprofen  Home Medications   Current Outpatient Rx  Name Route Sig Dispense Refill  . ALBUTEROL SULFATE HFA 108 (90 BASE) MCG/ACT IN AERS Inhalation Inhale 1-2 puffs into the lungs every 6 (six) hours as needed for wheezing. 1 Inhaler 0  . ASPIRIN 81 MG PO TABS Oral Take 81 mg by mouth  daily.      Marland Kitchen DILTIAZEM HCL ER COATED BEADS 240 MG PO CP24 Oral Take 240 mg by mouth daily.    Marland Kitchen GABAPENTIN 100 MG PO CAPS  Take one capsule by mouth at bedtime for one week then one twice a day for one week then one capsule three times a day    . GUAIFENESIN ER 600 MG PO TB12 Oral Take 1,200 mg by mouth 2 (two) times daily.    Marland Kitchen HYDROCHLOROTHIAZIDE 12.5 MG PO CAPS Oral Take 12.5 mg by mouth daily.    Marland Kitchen KETOCONAZOLE 2 % EX CREA Topical Apply topically daily. As needed to affected area 60 g 3  . KETOCONAZOLE 2 % EX SHAM Topical Apply 1 application topically 2 (two) times a week.    Marland Kitchen LATANOPROST 0.005 % OP SOLN Both Eyes Place 1  drop into both eyes at bedtime.     Marland Kitchen LORATADINE 10 MG PO TABS Oral Take 10 mg by mouth daily.    Marland Kitchen METFORMIN HCL 500 MG PO TABS Oral Take 1 tablet (500 mg total) by mouth 2 (two) times daily with a meal. 60 tablet 11  . OMEPRAZOLE 20 MG PO CPDR Oral Take 1 capsule (20 mg total) by mouth 2 (two) times daily. 60 capsule 11  . POTASSIUM CHLORIDE CRYS ER 20 MEQ PO TBCR Oral Take 20 mEq by mouth daily.    Marland Kitchen PRAVASTATIN SODIUM 40 MG PO TABS Oral Take 1 tablet (40 mg total) by mouth every evening. 30 tablet 11    BP 139/95  Temp 97.9 F (36.6 C) (Oral)  Resp 18  SpO2 97%  Physical Exam  Constitutional: She appears well-developed and well-nourished.  HENT:  Head: Normocephalic.  Right Ear: External ear normal.  Left Ear: External ear normal.  Nose: Nose normal.  Mouth/Throat: Uvula is midline and oropharynx is clear and moist.  Eyes: Pupils are equal, round, and reactive to light.  Neck: Normal range of motion.  Cardiovascular: Normal rate.   Musculoskeletal: Normal range of motion.  Neurological: She is alert.  Skin: Skin is warm. No rash noted.    ED Course  Procedures (including critical care time)   Labs Reviewed  RAPID STREP SCREEN   No results found.   No diagnosis found.    MDM   Abscess is negative.  This is most likely, seasonal allergies        Arman Filter, NP 09/13/11 2308

## 2011-09-16 DIAGNOSIS — H4011X0 Primary open-angle glaucoma, stage unspecified: Secondary | ICD-10-CM | POA: Diagnosis not present

## 2011-09-16 DIAGNOSIS — Z961 Presence of intraocular lens: Secondary | ICD-10-CM | POA: Diagnosis not present

## 2011-09-16 DIAGNOSIS — H409 Unspecified glaucoma: Secondary | ICD-10-CM | POA: Diagnosis not present

## 2011-09-16 DIAGNOSIS — E119 Type 2 diabetes mellitus without complications: Secondary | ICD-10-CM | POA: Diagnosis not present

## 2011-09-18 ENCOUNTER — Telehealth: Payer: Self-pay

## 2011-09-18 NOTE — Telephone Encounter (Signed)
Pt states loratadine makes her feel like fluid in throat; wants cost efficient option sent to [facility]. ? [facility] nurse, Jari Pigg, RN advised pt to get glucose meter with savings card(placed savings card and Ms Larita Fife' contact infor on Dr Royden Purl shelf.Please advise.

## 2011-09-18 NOTE — Telephone Encounter (Signed)
Linda Thompson states home health nurse came to home  on 8/28 and told her to get a glucometer. States Dr. Milinda Antis instructed her not to get a Glucometer. Was seen in ED for sore throat and received prescription for Loratadine 10 mg tablets for allergies. States she came to office on 09/18/11 and showed  prescription to office staff. States she feels like she has postnasal drainage and occassionally needs to clear her throat. Per Epic Dr Milinda Antis was informed that Linda Thompson states she feels like drainage in back of throat while on Loratadine. Dr. Milinda Antis advised Linda Thompson try over the counter Allegra 180 mg once daily. This triager gave instruction re: Allegra. Linda Thompson concerned about clearing her throat. Advised Allegra should help these symptoms/ [name] still concerned about Glucometer. States she was told not to use a glucometer. Please call Linda Thompson at [phone].

## 2011-09-18 NOTE — Telephone Encounter (Signed)
Let pt know she can try allergra otc 180 mg once daily instead of claritin- she can get this otc Send message back to me when done, thanks

## 2011-09-19 ENCOUNTER — Telehealth: Payer: Self-pay | Admitting: Family Medicine

## 2011-09-19 NOTE — Telephone Encounter (Signed)
Delman Cheadle called stating he has been trying to reach Pt in regards to getting a [employer] case worker to help manage her meds. Cannot reach Pt at home ans was advised to contact provider. Would this be something of use?

## 2011-09-19 NOTE — Telephone Encounter (Signed)
I very much want social worker to see her!-- I spoke with Cala Bradford this am about that --thanks so much  Tell the social worker to keep trying to reach her -she just may be out Thanks!

## 2011-09-19 NOTE — Telephone Encounter (Signed)
I spoke on the phone to Jari Pigg MSN, RN- who is seeing pt for [facility] We discussed her case in detail - and she convinced me to let pt have a glucometer for a trial period with supervision from her  They will give her very specific guidelines for when to call them Also she reassured me that a social worker will be visiting as well to assess her whole situation in terms of independence and safety since she has a mental disability Tell ms Madilyn Fireman I wrote her a px for test strips and it is here for her to pick up with the coupon for the monitor (she will take those to the pharmacy)  Thanks

## 2011-09-19 NOTE — Telephone Encounter (Signed)
Let her know I called her case worker/ nurse and will disc the glucometer again and then update her with my decision We may not get back to her until Tuesday, so tell her not to worry about it until she hears from me, thanks

## 2011-09-19 NOTE — Telephone Encounter (Signed)
Patient notified that Rx is ready for pick up.

## 2011-09-24 DIAGNOSIS — M25579 Pain in unspecified ankle and joints of unspecified foot: Secondary | ICD-10-CM | POA: Diagnosis not present

## 2011-09-24 DIAGNOSIS — IMO0002 Reserved for concepts with insufficient information to code with codable children: Secondary | ICD-10-CM | POA: Diagnosis not present

## 2011-09-24 DIAGNOSIS — M545 Low back pain: Secondary | ICD-10-CM | POA: Diagnosis not present

## 2011-09-24 NOTE — Telephone Encounter (Signed)
Patient informed that a Child psychotherapist will be contacting regarding help for her medications.

## 2011-09-26 ENCOUNTER — Encounter: Payer: Self-pay | Admitting: Family Medicine

## 2011-09-26 DIAGNOSIS — L600 Ingrowing nail: Secondary | ICD-10-CM | POA: Diagnosis not present

## 2011-09-26 DIAGNOSIS — E1149 Type 2 diabetes mellitus with other diabetic neurological complication: Secondary | ICD-10-CM | POA: Diagnosis not present

## 2011-09-29 ENCOUNTER — Telehealth: Payer: Self-pay | Admitting: Family Medicine

## 2011-09-29 NOTE — Telephone Encounter (Signed)
Patient stated that she has not vomitted today.

## 2011-09-29 NOTE — Telephone Encounter (Signed)
Please call Ms Cooley and ask how she is feeling re: vomiting--thanks

## 2011-09-29 NOTE — Telephone Encounter (Signed)
Triage Record Num: [id number] Operator: Tarri Glenn Patient Name: Linda Thompson Call Date & Time: 09/26/2011 8:19:57PM Patient Phone: [phone] PCP: Audrie Gallus. Tower Patient Gender: Female PCP Fax : Patient DOB: July 28, 1955 Practice Name: [facility] Reason for Call: Caller: [name]/Patient; PCP: Roxy Manns [facility]); CB#: [phone]; Call regarding Vomiting; Patient is calling about vomiting. Onset 09/26/11. Patient has vomited x2. Afebrile. All emergent signs and symptoms ruled out with exception to vomiting multiple times or unable to keep fluids down for more than 2 hours per Diabetes: Gastrointestinal Problems protocol. Prescription for Phenergan 25mg  tablets one every 4 hours as needed #6, no refills, [phone] to Tim, Rph, per standing orders. Patient made aware. Protocol(s) Used: Diabetes: Gastrointestinal Problems Recommended Outcome per Protocol: See ED Immediately Override Outcome if Used in Protocol: Provide Home/Self Care RN Reason for Override Outcome: Rx Standing Orders Used. Reason for Outcome: Vomiting multiple times OR unable to keep fluids down for more than 2 hours Care Advice: ~ 09/26/2011 8:36:17PM Page 1 of 1 [id number]

## 2011-09-29 NOTE — Telephone Encounter (Signed)
That is very re assuring, thanks

## 2011-10-03 ENCOUNTER — Ambulatory Visit (INDEPENDENT_AMBULATORY_CARE_PROVIDER_SITE_OTHER): Payer: Medicare Other | Admitting: Family Medicine

## 2011-10-03 ENCOUNTER — Encounter: Payer: Self-pay | Admitting: Family Medicine

## 2011-10-03 VITALS — BP 130/78 | HR 81 | Temp 97.8°F | Ht 62.0 in | Wt 198.0 lb

## 2011-10-03 DIAGNOSIS — N390 Urinary tract infection, site not specified: Secondary | ICD-10-CM | POA: Diagnosis not present

## 2011-10-03 DIAGNOSIS — R350 Frequency of micturition: Secondary | ICD-10-CM

## 2011-10-03 LAB — POCT URINALYSIS DIPSTICK
Glucose, UA: NEGATIVE
Ketones, UA: NEGATIVE
Spec Grav, UA: 1.01
Urobilinogen, UA: 0.2

## 2011-10-03 MED ORDER — SULFAMETHOXAZOLE-TRIMETHOPRIM 800-160 MG PO TABS: 1.0000 | ORAL_TABLET | Freq: Two times a day (BID) | ORAL | Status: DC

## 2011-10-03 NOTE — Patient Instructions (Addendum)
You have a mild uti (urine infection) Take the antibiotic- twice daily for 3 days- I sent it to your pharmacy  We will call you when urine culture returns  If symptoms worsen- please call

## 2011-10-03 NOTE — Progress Notes (Signed)
Subjective:    Patient ID: Linda Thompson, female    DOB: 1955-08-29, 56 y.o.   MRN: [id number]  HPI Here with uti symptoms Frequent urination Also low back pain  No burning to urinate  No fever or vomiting Does have urgency  ua is mod for leukocytes today   Patient Active Problem List  Diagnosis  . FUNGAL DERMATITIS  . MENINGIOMA  . DIABETES MELLITUS, TYPE II  . HYPERLIPIDEMIA  . MENTAL RETARDATION, MODERATE  . GLAUCOMA NOS  . HYPERTENSION  . ALLERGIC  RHINITIS  . GERD  . HIATAL HERNIA  . DERMATITIS, SEBORRHEIC NOS  . PRURITUS ANI  . OSTEOARTHRITIS, FOOT  . HEART MURMUR  . TRANSAMINASES, SERUM, ELEVATED  . Personal history of other musculoskeletal disorders  . IRRITABLE BOWEL SYNDROME  . Hypokalemia  . Back pain  . Candidal intertrigo  . Obesity  . Tinea pedis  . Insect bites  . Acute bronchitis, bacterial  . Viral sore throat  . Abdominal  pain, other specified site  . Diarrhea  . Chronic dermatitis of hands   Past Medical History  Diagnosis Date  . Allergic rhinitis, cause unspecified   . Pain in joint, ankle and foot   . Backache, unspecified     chronic LBP with radiculopathy  . Seborrheic dermatitis, unspecified   . Type II or unspecified type diabetes mellitus without mention of complication, not stated as uncontrolled   . History of fracture of arm     Right  . History of fracture of foot   . Dermatomycosis, unspecified   . Esophageal reflux   . Unspecified glaucoma   . Undiagnosed cardiac murmurs   . Diaphragmatic hernia without mention of obstruction or gangrene   . Other and unspecified hyperlipidemia   . Unspecified essential hypertension   . Irritable bowel syndrome   . Benign neoplasm of cerebral meninges   . Moderate intellectual disabilities   . Nausea alone   . Osteoarthrosis, unspecified whether generalized or localized, ankle and foot   . Pruritus ani   . Unspecified tinnitus   . Nonspecific elevation of levels of transaminase  or lactic acid dehydrogenase (LDH)   . Leukorrhea, not specified as infective   . Obesity   . Hyperglycemia   . DDD (degenerative disc disease), lumbar    Past Surgical History  Procedure Date  . Total abdominal hysterectomy   . Cataract extraction   . Elbow surgery     right elbow   History  Substance Use Topics  . Smoking status: Never Smoker   . Smokeless tobacco: Never Used  . Alcohol Use: No   Family History  Problem Relation Age of Onset  . Diabetes Mother   . Diabetes      Aunt  . Stroke      Aunt (had pacemaker)   Allergies  Allergen Reactions  . Ace Inhibitors     REACTION: COUGH  . Amoxicillin-Pot Clavulanate     REACTION: diarrhea  . Amoxicillin-Pot Clavulanate Other (See Comments)    Pt unaware of reaction to augmentin  . Cyclobenzaprine Hcl     REACTION: diarrhea  . Flonase (Fluticasone Propionate)     nosebleed  . Ibuprofen     REACTION: vomiting   Current Outpatient Prescriptions on File Prior to Visit  Medication Sig Dispense Refill  . albuterol (PROVENTIL HFA;VENTOLIN HFA) 108 (90 BASE) MCG/ACT inhaler Inhale 1-2 puffs into the lungs every 6 (six) hours as needed for wheezing.  1 Inhaler  0  . aspirin 81 MG tablet Take 81 mg by mouth daily.        Marland Kitchen diltiazem (CARDIZEM CD) 240 MG 24 hr capsule Take 240 mg by mouth daily.      Marland Kitchen gabapentin (NEURONTIN) 100 MG capsule Take one capsule by mouth at bedtime for one week then one twice a day for one week then one capsule three times a day      . guaiFENesin (MUCINEX) 600 MG 12 hr tablet Take 1,200 mg by mouth 2 (two) times daily.      . hydrochlorothiazide (MICROZIDE) 12.5 MG capsule Take 12.5 mg by mouth daily.      Marland Kitchen ketoconazole (NIZORAL) 2 % cream Apply topically daily. As needed to affected area  60 g  3  . ketoconazole (NIZORAL) 2 % shampoo Apply 1 application topically 2 (two) times a week.      . latanoprost (XALATAN) 0.005 % ophthalmic solution Place 1 drop into both eyes at bedtime.       Marland Kitchen  loratadine (CLARITIN) 10 MG tablet Take 10 mg by mouth daily.      Marland Kitchen loratadine (CLARITIN) 10 MG tablet Take 1 tablet (10 mg total) by mouth daily.  30 tablet  0  . metFORMIN (GLUCOPHAGE) 500 MG tablet Take 1 tablet (500 mg total) by mouth 2 (two) times daily with a meal.  60 tablet  11  . omeprazole (PRILOSEC) 20 MG capsule Take 1 capsule (20 mg total) by mouth 2 (two) times daily.  60 capsule  11  . potassium chloride SA (K-DUR,KLOR-CON) 20 MEQ tablet Take 20 mEq by mouth daily.      . pravastatin (PRAVACHOL) 40 MG tablet Take 1 tablet (40 mg total) by mouth every evening.  30 tablet  11  . DISCONTD: Calcium Carbonate-Vitamin D (CALCIUM-VITAMIN D) 500-200 MG-UNIT per tablet Take 1 tablet by mouth 2 (two) times daily with a meal.        . DISCONTD: fexofenadine (ALLEGRA) 180 MG tablet Take 1 tablet (180 mg total) by mouth daily.  15 tablet  0      Review of Systems Review of Systems  Constitutional: Negative for fever, appetite change, fatigue and unexpected weight change.  Eyes: Negative for pain and visual disturbance.  Respiratory: Negative for cough and shortness of breath.   Cardiovascular: Negative for cp or palpitations    Gastrointestinal: Negative for nausea, diarrhea and constipation.  Genitourinary: pos for urgency and frequency without incontinence , neg for dysuria or blood in urine Skin: Negative for pallor or rash   MSK pos for low back pain, neg for flank pain Neurological: Negative for weakness, light-headedness, numbness and headaches.  Hematological: Negative for adenopathy. Does not bruise/bleed easily.  Psychiatric/Behavioral: Negative for dysphoric mood. The patient is not nervous/anxious.         Objective:   Physical Exam  Constitutional: She appears well-developed and well-nourished. No distress.       obese and well appearing   HENT:  Head: Normocephalic and atraumatic.  Eyes: Conjunctivae normal and EOM are normal. Pupils are equal, round, and reactive to  light.  Neck: Normal range of motion. Neck supple.  Cardiovascular: Normal rate and regular rhythm.   Pulmonary/Chest: Effort normal and breath sounds normal.  Abdominal: Soft. Bowel sounds are normal. She exhibits no distension and no mass. There is no tenderness. There is no rebound and no guarding.       No cva tenderness   Neurological: She is alert.  Skin: Skin is warm and dry. No rash noted.  Psychiatric: She has a normal mood and affect.       Baseline MR  She repeats herself frequently pleasant          Assessment & Plan:

## 2011-10-03 NOTE — Assessment & Plan Note (Signed)
uti - mild , given 3 day course of septra  Urine cx Disc s/s to look for  Update if not starting to improve in a week or if worsening   Will call with cx report

## 2011-10-04 ENCOUNTER — Encounter (HOSPITAL_COMMUNITY): Payer: Self-pay | Admitting: *Deleted

## 2011-10-04 DIAGNOSIS — M5137 Other intervertebral disc degeneration, lumbosacral region: Secondary | ICD-10-CM | POA: Insufficient documentation

## 2011-10-04 DIAGNOSIS — E119 Type 2 diabetes mellitus without complications: Secondary | ICD-10-CM | POA: Diagnosis not present

## 2011-10-04 DIAGNOSIS — J019 Acute sinusitis, unspecified: Secondary | ICD-10-CM | POA: Diagnosis not present

## 2011-10-04 DIAGNOSIS — R51 Headache: Secondary | ICD-10-CM | POA: Insufficient documentation

## 2011-10-04 DIAGNOSIS — K589 Irritable bowel syndrome without diarrhea: Secondary | ICD-10-CM | POA: Insufficient documentation

## 2011-10-04 DIAGNOSIS — K219 Gastro-esophageal reflux disease without esophagitis: Secondary | ICD-10-CM | POA: Insufficient documentation

## 2011-10-04 DIAGNOSIS — E785 Hyperlipidemia, unspecified: Secondary | ICD-10-CM | POA: Diagnosis not present

## 2011-10-04 DIAGNOSIS — J329 Chronic sinusitis, unspecified: Secondary | ICD-10-CM | POA: Insufficient documentation

## 2011-10-04 DIAGNOSIS — I10 Essential (primary) hypertension: Secondary | ICD-10-CM | POA: Insufficient documentation

## 2011-10-04 DIAGNOSIS — M51379 Other intervertebral disc degeneration, lumbosacral region without mention of lumbar back pain or lower extremity pain: Secondary | ICD-10-CM | POA: Insufficient documentation

## 2011-10-04 DIAGNOSIS — E669 Obesity, unspecified: Secondary | ICD-10-CM | POA: Diagnosis not present

## 2011-10-04 LAB — POCT UA - MICROSCOPIC ONLY

## 2011-10-04 NOTE — ED Notes (Signed)
The pt reports, that she has had a headache.  She also has had a sorethroat  Intermittently for awhile., she was seen by her doctor Friday and dxd with a uti

## 2011-10-05 ENCOUNTER — Emergency Department (HOSPITAL_COMMUNITY)
Admission: EM | Admit: 2011-10-05 | Discharge: 2011-10-05 | Disposition: A | Discharge status: Home / Self Care | Payer: Medicare Other | Attending: Emergency Medicine | Admitting: Emergency Medicine

## 2011-10-05 DIAGNOSIS — R51 Headache: Secondary | ICD-10-CM

## 2011-10-05 DIAGNOSIS — J329 Chronic sinusitis, unspecified: Secondary | ICD-10-CM

## 2011-10-05 LAB — URINE CULTURE: Colony Count: >=100000

## 2011-10-05 MED ORDER — AZITHROMYCIN 250 MG PO TABS: 250.0000 mg | ORAL_TABLET | Freq: Every day | ORAL | Status: AC

## 2011-10-05 MED ORDER — ACETAMINOPHEN 500 MG PO TABS: 1000.0000 mg | ORAL_TABLET | Freq: Once | ORAL | Status: DC

## 2011-10-05 NOTE — ED Provider Notes (Signed)
History     CSN: [id number]  Arrival date & time 10/04/11  2310   First MD Initiated Contact with Patient 10/05/11 0045      Chief Complaint  Patient presents with  . Headache    (Consider location/radiation/quality/duration/timing/severity/associated sxs/prior treatment) HPI Comments: 56 year old female with a history of a recent urinary tract infection who presents with several days of nasal stuffiness, postnasal drip, mild headache which started this evening. She denies fevers chills nausea vomiting diarrhea coughing or shortness of breath. Her doctor has put her on Bactrim for 3 days, she has had 4 doses of the medication and has had relief of her urinary symptoms. Her symptoms are mild at this time, nothing makes it better or worse.  Patient is a 56 y.o. female presenting with headaches. The history is provided by the patient.  Headache     Past Medical History  Diagnosis Date  . Allergic rhinitis, cause unspecified   . Pain in joint, ankle and foot   . Backache, unspecified     chronic LBP with radiculopathy  . Seborrheic dermatitis, unspecified   . Type II or unspecified type diabetes mellitus without mention of complication, not stated as uncontrolled   . History of fracture of arm     Right  . History of fracture of foot   . Dermatomycosis, unspecified   . Esophageal reflux   . Unspecified glaucoma   . Undiagnosed cardiac murmurs   . Diaphragmatic hernia without mention of obstruction or gangrene   . Other and unspecified hyperlipidemia   . Unspecified essential hypertension   . Irritable bowel syndrome   . Benign neoplasm of cerebral meninges   . Moderate intellectual disabilities   . Nausea alone   . Osteoarthrosis, unspecified whether generalized or localized, ankle and foot   . Pruritus ani   . Unspecified tinnitus   . Nonspecific elevation of levels of transaminase or lactic acid dehydrogenase (LDH)   . Leukorrhea, not specified as infective   . Obesity    . Hyperglycemia   . DDD (degenerative disc disease), lumbar     Past Surgical History  Procedure Date  . Total abdominal hysterectomy   . Cataract extraction   . Elbow surgery     right elbow    Family History  Problem Relation Age of Onset  . Diabetes Mother   . Diabetes      Aunt  . Stroke      Aunt (had pacemaker)    History  Substance Use Topics  . Smoking status: Never Smoker   . Smokeless tobacco: Never Used  . Alcohol Use: No    OB History    Grav Para Term Preterm Abortions TAB SAB Ect Mult Living                  Review of Systems  Neurological: Positive for headaches.  All other systems reviewed and are negative.    Allergies  Ace inhibitors; Amoxicillin-pot clavulanate; Amoxicillin-pot clavulanate; Cyclobenzaprine hcl; Flonase; and Ibuprofen  Home Medications   Current Outpatient Rx  Name Route Sig Dispense Refill  . ALBUTEROL SULFATE HFA 108 (90 BASE) MCG/ACT IN AERS Inhalation Inhale 1-2 puffs into the lungs every 6 (six) hours as needed for wheezing. 1 Inhaler 0  . ASPIRIN 81 MG PO TABS Oral Take 81 mg by mouth daily.      Marland Kitchen DILTIAZEM HCL ER COATED BEADS 240 MG PO CP24 Oral Take 240 mg by mouth daily.    Marland Kitchen  HYDROCHLOROTHIAZIDE 12.5 MG PO CAPS Oral Take 12.5 mg by mouth daily.    Marland Kitchen KETOCONAZOLE 2 % EX CREA Topical Apply topically daily. As needed to affected area 60 g 3  . KETOCONAZOLE 2 % EX SHAM Topical Apply 1 application topically 2 (two) times a week.    Marland Kitchen LATANOPROST 0.005 % OP SOLN Both Eyes Place 1 drop into both eyes at bedtime.     Marland Kitchen LORATADINE 10 MG PO TABS Oral Take 10 mg by mouth daily as needed. For allergy relief    . METFORMIN HCL 500 MG PO TABS Oral Take 1 tablet (500 mg total) by mouth 2 (two) times daily with a meal. 60 tablet 11  . OMEPRAZOLE 20 MG PO CPDR Oral Take 1 capsule (20 mg total) by mouth 2 (two) times daily. 60 capsule 11  . POTASSIUM CHLORIDE CRYS ER 20 MEQ PO TBCR Oral Take 20 mEq by mouth daily.    Marland Kitchen PRAVASTATIN  SODIUM 40 MG PO TABS Oral Take 1 tablet (40 mg total) by mouth every evening. 30 tablet 11  . SULFAMETHOXAZOLE-TRIMETHOPRIM 800-160 MG PO TABS Oral Take 1 tablet by mouth 2 (two) times daily. 6 tablet 0    BP 131/77  Pulse 87  Temp 97.7 F (36.5 C) (Oral)  Resp 16  SpO2 96%  Physical Exam  Nursing note and vitals reviewed. Constitutional: She appears well-developed and well-nourished. No distress.  HENT:  Head: Normocephalic and atraumatic.  Mouth/Throat: Oropharynx is clear and moist. No oropharyngeal exudate.       Tympanic membranes clear bilaterally, turbinates are edematous bilaterally, no significant nasal discharge, oropharynx clear, mucous membranes moist  Eyes: Conjunctivae normal and EOM are normal. Pupils are equal, round, and reactive to light. Right eye exhibits no discharge. Left eye exhibits no discharge. No scleral icterus.  Neck: Normal range of motion. Neck supple. No JVD present. No thyromegaly present.       No lymphadenopathy  Cardiovascular: Normal rate, regular rhythm, normal heart sounds and intact distal pulses.  Exam reveals no gallop and no friction rub.   No murmur heard. Pulmonary/Chest: Effort normal and breath sounds normal. No respiratory distress. She has no wheezes. She has no rales.  Abdominal: Soft. Bowel sounds are normal. She exhibits no distension and no mass. There is no tenderness.  Musculoskeletal: Normal range of motion. She exhibits no edema and no tenderness.  Lymphadenopathy:    She has no cervical adenopathy.  Neurological: She is alert. Coordination normal.  Skin: Skin is warm and dry. No rash noted. No erythema.  Psychiatric: She has a normal mood and affect. Her behavior is normal.    ED Course  Procedures (including critical care time)  Labs Reviewed - No data to display No results found.   1. Sinusitis   2. Headache       MDM  The patient appears well, has normal vital signs but she does have some signs of sinusitis on  history. Zithromax prescribed, Tylenol for headache, patient states that the headache is minimal, there is no fever, no stiff neck and no focal neurologic deficits, she has been ambulating without difficulty throughout the emergency department and has followup with family Dr., she appears stable for discharge.        Vida Roller, MD 10/05/11 [phone]

## 2011-10-06 ENCOUNTER — Telehealth: Payer: Self-pay | Admitting: Family Medicine

## 2011-10-06 ENCOUNTER — Telehealth: Payer: Self-pay

## 2011-10-06 NOTE — Telephone Encounter (Signed)
Triage Record Num: [id number] Operator: Chevis Pretty Patient Name: Linda Thompson Call Date & Time: 10/04/2011 3:29:38PM Patient Phone: [phone] PCP: Audrie Gallus. Tower Patient Gender: Female PCP Fax : Patient DOB: 1955/10/05 Practice Name: [facility] Reason for Call: Caller: Tigerlily/Patient; PCP: Roxy Manns [facility]); CB#: [phone]; Call regarding urinary symptoms. Seen in office 10/03/11; diagnosed with UTI. Rx for Bactrim DS was faxed to pharmacy; was to take 1 tab BID. States she picked up Rx, took one tablet and took a second one before bed. States the pills make her feel nauseated. Wants to know if she can eat yogurt. Per protocol, emergent symptoms denied; advised for home care, with callback parameters given. Protocol(s) Used: Nausea or Vomiting Recommended Outcome per Protocol: Call Provider within 24 Hours Reason for Outcome: Symptoms began after starting or changing dose of prescription, nonprescription, alternative medication, or illicit drug Care Advice: ~ Avoid drinking alcoholic or caffeinated beverages. ~ Speak with provider before next dose of medication is due. ~ SYMPTOM / CONDITION MANAGEMENT ~ CAUTIONS Nausea Care Advice: - Drink small amounts of clear, sweetened liquids or ice cold drinks. - Eat light, bland foods such as saltine crackers or plain bread. - Do not eat high fat, highly seasoned, high fiber, or high sugar content foods. - Avoid mixing hot food and cold foods. - Eat smaller, more frequent meals. - Rest as much as possible in a sitting or in a propped lying position. Do not lie flat for at least 2 hours after eating. - Do not take pain medication (such as aspirin, NSAIDs) while nauseated. - Rest as much as possible until symptoms improve since activity may worsen nausea. ~ Do not use nonprescription laxative or pain relievers like aspirin or other non-steroidal anti-inflammatory drugs. Consult your provider regarding  continuing prescription medications. ~ Medication Advice: - Discontinue all nonprescription and alternative medications, especially stimulants, until evaluated by provider. - Take prescribed medications as directed, following label instructions for the medication. - Do not change medications or dosing regimen until provider is consulted. - Know possible side effects of medication and what to do if they occur. - Tell provider all prescription, nonprescription or alternative medications that you take ~ Vomiting Care Advice: - Do not eat solid foods until vomiting subsides. - Begin taking fluids by sucking on ice chips or popsicles or taking sips of cool clear, nonprescription oral rehydration solution). - Gradually drink larger amounts of these fluids so that you are drinking six to eight 8 oz. (.2 liter) of fluids a day. - Keep activity to a minimum. - After vomiting subsides, eat smaller, more frequent meals of easily digested foods such as crackers, toast, bananas, rice, cooked cereal, applesauce, broth, baked or mashed potatoes, chicken or [place] without skin. Eat slowly. - Take fluids 30 minutes before or 60 minutes after meals. ~

## 2011-10-06 NOTE — Telephone Encounter (Signed)
Left message on voicemail for pt to call back, will try to call later

## 2011-10-06 NOTE — Telephone Encounter (Signed)
Pt ended up going to ER - aware

## 2011-10-06 NOTE — Telephone Encounter (Signed)
Triage Record Num: [id number] Operator: Edgar Frisk Patient Name: Linda Thompson Call Date & Time: 10/06/2011 5:32:11AM Patient Phone: [phone] PCP: Audrie Gallus. Tower Patient Gender: Female PCP Fax : Patient DOB: 1955/06/29 Practice Name: [facility] Reason for Call: Caller: Cardelia/Patient; PCP: Roxy Manns [facility]); CB#: [phone]; Call regarding Urinary Pain/Bleeding; Patient reports was seen on 10/03/11, for pain on urination. Placed on Antibiotic. Was seen on Saturday 10/04/11 in ED for sinus pain and dx with Sinus infection and placed on Azithromycin. Calling to make Dr Lucretia Roers aware of her ED visit. Protocol(s) Used: Office Note Recommended Outcome per Protocol: Information Noted and Sent to Office Reason for Outcome: Caller information to office Care Advice: ~

## 2011-10-06 NOTE — Telephone Encounter (Signed)
Take the loperimide only if needed for diarrhea Keep the GI appt unless she feels too sick to go  For cough - mucinex DM over the counter is fine (she may have some at home already)  Update if worse

## 2011-10-06 NOTE — Telephone Encounter (Signed)
Triage Record Num: [id number] Operator: Kelle Darting Patient Name: Linda Thompson Call Date & Time: 10/03/2011 11:47:20PM Patient Phone: [phone] PCP: Audrie Gallus. Tower Patient Gender: Female PCP Fax : Patient DOB: 1955-06-24 Practice Name: [facility] Reason for Call: [name]/Patient; PCP: [name]; CB#: [phone]; Call regarding Vomiting and congestion; Afebrile; Onset: 10/03/11; Symptom notes: Has a spray for her sinuses that is over the counter, put some in her nose and then started coughing and then vomited, eyes got red; denies any pain at this time or breathing problems, has a headache and is rating it at a 3 out of 10, thinks she is having a sinus problem; was seen in office yesterday and was told she had an infection in her urine and was started on Antibiotics for same, only symptoms are frontal headache, thinks it is her sinuses; Guideline used: Headache; Disposition: Home care due to all other situations, all emergent questions are negative; Appointment made: No; Patient voices understanding of care advice. Protocol(s) Used: Headache Recommended Outcome per Protocol: Provide Home/Self Care Reason for Outcome: All other situations Care Advice: ~ Call

## 2011-10-06 NOTE — Telephone Encounter (Signed)
Advise pt to take the mucinex DM for the cough, keep the GI appt, and only take the loperamide if needed for diarrhea

## 2011-10-06 NOTE — Telephone Encounter (Signed)
Pt seen Linda Thompson 10/04/11; pt taking Z pak for sinusitis; this AM started with productive cough with yellow phlegm. Pt request med for cough; pt wants to know if Z pak starts diarrhea can pt take Loperamide or should pt take Loperamide to prevent diarrhea; pt had soft BM this AM but no diarrhea. Since pt on antibiotic does Dr Milinda Antis want pt to go to GI referral on Wed or should she reschedule.Please advise.

## 2011-10-07 ENCOUNTER — Telehealth: Payer: Self-pay

## 2011-10-07 NOTE — Telephone Encounter (Signed)
Agreed, thanks

## 2011-10-07 NOTE — Telephone Encounter (Signed)
Pt wanted to know if Dr Milinda Antis would advise pt to take Loperamide before goes to GI Dr tomorrow. Advised pt to take Loperamide only if has diarrhea; as advised 10/06/11. Pt had normal BM yesterday; had 2 BMs this morning but not diarrhea. Advised pt if no diarrhea not to take Loperamide. Pt verbalized understanding.

## 2011-10-08 ENCOUNTER — Encounter: Payer: Self-pay | Admitting: Gastroenterology

## 2011-10-08 ENCOUNTER — Ambulatory Visit (INDEPENDENT_AMBULATORY_CARE_PROVIDER_SITE_OTHER): Payer: Medicare Other | Admitting: Gastroenterology

## 2011-10-08 VITALS — BP 120/80 | HR 100 | Ht 62.0 in | Wt 191.5 lb

## 2011-10-08 DIAGNOSIS — R197 Diarrhea, unspecified: Secondary | ICD-10-CM

## 2011-10-08 NOTE — Assessment & Plan Note (Signed)
Diarrhea seems to be related to certain foods. I suspect that she is not digesting certain high fiber foods and fatty foods may cause diarrhea. Underlying chronic disease is unlikely. She may have some degree of lactose intolerance.  Recommendations #1 refer to dietitian for counseling and to help record foods that she may be intolerant to

## 2011-10-08 NOTE — Progress Notes (Signed)
History of Present Illness:  Linda Thompson has returned for evaluation of diarrhea. For the past year or 2 she has complained of intermittent diarrhea. This tends to occur when she eats roughage or fried foods. She may have diarrhea up to once a week. Colonoscopy several months ago was unremarkable. Weight is stable. She may have some intolerance to milk products. She was recently given antibiotics for a sinus infection. Symptoms clearly preceded antibiotic use. There has been no change in medications or diet.    Review of Systems: Pertinent positive and negative review of systems were noted in the above HPI section. All other review of systems were otherwise negative.    Current Medications, Allergies, Past Medical History, Past Surgical History, Family History and Social History were reviewed in [employer] electronic medical record  Vital signs were reviewed in today's medical record. Physical Exam: General: Well developed , well nourished, no acute distress Abdomen is without masses, tenderness organomegaly Neurological: Alert oriented x 4, grossly nonfocal Psychological:  Alert and cooperative. Normal mood and affect

## 2011-10-08 NOTE — Patient Instructions (Addendum)
You will need to purchase Florastor over the counter at your pharmacy You will need to one a day for 7 days Record your food eaten if you have diarrhea Follow up in 6 weeks

## 2011-10-15 ENCOUNTER — Telehealth: Payer: Self-pay

## 2011-10-15 NOTE — Telephone Encounter (Signed)
Pt came by to update Dr Milinda Antis; Azithromycin gave pt diarrhea; pt advised by Dr Arlyce Dice to stop Azithromycin and take Probiotic for 7 days.Took last pill 10/14/11.Dr Arlyce Dice told pt OK to take Loperamide for diarrhea.  Pt had normal BM 10/14/11. Pt has f/u with Dr Arlyce Dice 11/18/11. Pt advised to call Dr Marzetta Board office with any questions about stomach problems or diarrhea. Pt understood.

## 2011-10-15 NOTE — Telephone Encounter (Signed)
Thanks for the update.  Sounds like a good plan.

## 2011-10-20 ENCOUNTER — Other Ambulatory Visit: Payer: Self-pay | Admitting: *Deleted

## 2011-10-20 MED ORDER — METFORMIN HCL 500 MG PO TABS: 500.0000 mg | ORAL_TABLET | Freq: Two times a day (BID) | ORAL | Status: DC

## 2011-10-22 ENCOUNTER — Ambulatory Visit (INDEPENDENT_AMBULATORY_CARE_PROVIDER_SITE_OTHER): Payer: Medicare Other | Admitting: Family Medicine

## 2011-10-22 ENCOUNTER — Encounter: Payer: Self-pay | Admitting: Family Medicine

## 2011-10-22 VITALS — BP 146/92 | HR 84 | Temp 97.4°F | Ht 62.0 in | Wt 195.5 lb

## 2011-10-22 DIAGNOSIS — R05 Cough: Secondary | ICD-10-CM

## 2011-10-22 NOTE — Patient Instructions (Addendum)
Your cough this morning may have been from allergies or an early cold Your exam is normal today- that is reassuring  Nothing to do- let's just watch this  If you get worse or develop fever or other symptoms - please let me know

## 2011-10-22 NOTE — Progress Notes (Signed)
Subjective:    Patient ID: Linda Thompson, female    DOB: 04-09-1955, 56 y.o.   MRN: [id number]  HPI This am got up to eat breakfast  All at once she started coughing - some phlegm came up and that caused her to vomit  Wondered if she may be taking a cold  Is on prilosec for gerd   No runny nose or sore throat or fever Doing ok   Patient Active Problem List  Diagnosis  . FUNGAL DERMATITIS  . MENINGIOMA  . DIABETES MELLITUS, TYPE II  . HYPERLIPIDEMIA  . MENTAL RETARDATION, MODERATE  . GLAUCOMA NOS  . HYPERTENSION  . ALLERGIC  RHINITIS  . GERD  . HIATAL HERNIA  . DERMATITIS, SEBORRHEIC NOS  . PRURITUS ANI  . OSTEOARTHRITIS, FOOT  . HEART MURMUR  . TRANSAMINASES, SERUM, ELEVATED  . Personal history of other musculoskeletal disorders  . IRRITABLE BOWEL SYNDROME  . Hypokalemia  . Back pain  . Candidal intertrigo  . Obesity  . Tinea pedis  . Insect bites  . Acute bronchitis, bacterial  . Viral sore throat  . Abdominal  pain, other specified site  . Diarrhea  . Chronic dermatitis of hands  . UTI (lower urinary tract infection)   Past Medical History  Diagnosis Date  . Allergic rhinitis, cause unspecified   . Pain in joint, ankle and foot   . Backache, unspecified     chronic LBP with radiculopathy  . Seborrheic dermatitis, unspecified   . Type II or unspecified type diabetes mellitus without mention of complication, not stated as uncontrolled   . History of fracture of arm     Right  . History of fracture of foot   . Dermatomycosis, unspecified   . Esophageal reflux   . Unspecified glaucoma   . Undiagnosed cardiac murmurs   . Diaphragmatic hernia without mention of obstruction or gangrene   . Other and unspecified hyperlipidemia   . Unspecified essential hypertension   . Irritable bowel syndrome   . Benign neoplasm of cerebral meninges   . Moderate intellectual disabilities   . Nausea alone   . Osteoarthrosis, unspecified whether generalized or localized,  ankle and foot   . Pruritus ani   . Unspecified tinnitus   . Nonspecific elevation of levels of transaminase or lactic acid dehydrogenase (LDH)   . Leukorrhea, not specified as infective   . Obesity   . Hyperglycemia   . DDD (degenerative disc disease), lumbar    Past Surgical History  Procedure Date  . Total abdominal hysterectomy   . Cataract extraction   . Elbow surgery     right elbow   History  Substance Use Topics  . Smoking status: Never Smoker   . Smokeless tobacco: Never Used  . Alcohol Use: No   Family History  Problem Relation Age of Onset  . Diabetes Mother   . Diabetes Maternal Aunt   . Heart disease Maternal Aunt      (had pacemaker)  . Diabetes Maternal Grandmother   . Stroke Mother   . Stroke Father   . Stroke Maternal Grandmother    Allergies  Allergen Reactions  . Ace Inhibitors     REACTION: COUGH  . Amoxicillin-Pot Clavulanate     REACTION: diarrhea  . Amoxicillin-Pot Clavulanate Other (See Comments)    Pt unaware of reaction to augmentin  . Azithromycin Diarrhea  . Cyclobenzaprine Hcl     REACTION: diarrhea  . Flonase (Fluticasone Propionate)  nosebleed  . Ibuprofen     REACTION: vomiting   Current Outpatient Prescriptions on File Prior to Visit  Medication Sig Dispense Refill  . aspirin 81 MG tablet Take 81 mg by mouth daily.        Marland Kitchen diltiazem (CARDIZEM CD) 240 MG 24 hr capsule Take 240 mg by mouth daily.      . hydrochlorothiazide (MICROZIDE) 12.5 MG capsule Take 12.5 mg by mouth daily.      Marland Kitchen ketoconazole (NIZORAL) 2 % cream Apply topically daily. As needed to affected area  60 g  3  . ketoconazole (NIZORAL) 2 % shampoo Apply 1 application topically 2 (two) times a week.      . latanoprost (XALATAN) 0.005 % ophthalmic solution Place 1 drop into both eyes at bedtime.       . metFORMIN (GLUCOPHAGE) 500 MG tablet Take 1 tablet (500 mg total) by mouth 2 (two) times daily with a meal.  180 tablet  3  . omeprazole (PRILOSEC) 20 MG  capsule Take 1 capsule (20 mg total) by mouth 2 (two) times daily.  60 capsule  11  . potassium chloride SA (K-DUR,KLOR-CON) 20 MEQ tablet Take 20 mEq by mouth daily.      . pravastatin (PRAVACHOL) 40 MG tablet Take 1 tablet (40 mg total) by mouth every evening.  30 tablet  11  . promethazine (PHENERGAN) 25 MG tablet Take 25 mg by mouth every 6 (six) hours as needed.      Marland Kitchen Propylene Glycol (SYSTANE BALANCE OP) Apply to eye.      Marland Kitchen DISCONTD: Calcium Carbonate-Vitamin D (CALCIUM-VITAMIN D) 500-200 MG-UNIT per tablet Take 1 tablet by mouth 2 (two) times daily with a meal.        . DISCONTD: fexofenadine (ALLEGRA) 180 MG tablet Take 1 tablet (180 mg total) by mouth daily.  15 tablet  0     Review of Systems Review of Systems  Constitutional: Negative for fever, appetite change, fatigue and unexpected weight change.  Eyes: Negative for pain and visual disturbance.  ENT neg for rhinorrhea and cong/ neg for st  Respiratory: Negative sob or wheeze - pos for cough this am that is better now   Cardiovascular: Negative for cp or palpitations   neg for edema Gastrointestinal: Negative for nausea, diarrhea and constipation.  Genitourinary: Negative for urgency and frequency.  Skin: Negative for pallor or rash   Neurological: Negative for weakness, light-headedness, numbness and headaches.  Hematological: Negative for adenopathy. Does not bruise/bleed easily.  Psychiatric/Behavioral: Negative for dysphoric mood. The patient is not nervous/anxious.         Objective:   Physical Exam  Constitutional: She appears well-nourished. No distress.       obese and well appearing   HENT:  Head: Normocephalic and atraumatic.  Right Ear: External ear normal.  Left Ear: External ear normal.  Mouth/Throat: Oropharynx is clear and moist. No oropharyngeal exudate.       Nares are clear but boggy Not congested No sinus tenderness Throat clear  Eyes: Conjunctivae normal and EOM are normal. Pupils are equal,  round, and reactive to light. Right eye exhibits no discharge. Left eye exhibits no discharge.  Neck: Normal range of motion. Neck supple.  Cardiovascular: Normal rate and regular rhythm.   Pulmonary/Chest: Effort normal and breath sounds normal. No respiratory distress. She has no wheezes. She has no rales. She exhibits no tenderness.       Good air exch No rhonchi or crackles  Musculoskeletal: She exhibits no edema.  Lymphadenopathy:    She has no cervical adenopathy.  Neurological: She is alert.  Skin: Skin is warm and dry. No rash noted.  Psychiatric: She has a normal mood and affect.       Baseline MR- repeats herself frequently Mood is good          Assessment & Plan:

## 2011-10-22 NOTE — Assessment & Plan Note (Signed)
Cough once this am without other symptoms -- pt vomited from phlegm (she gags easily but no hx of dysphagia) This could be early viral uri or poss allergies Since exam is normal and she feels fine now - will watch and wait Pt aware to update if worse or new symptoms

## 2011-10-23 ENCOUNTER — Telehealth: Payer: Self-pay

## 2011-10-23 NOTE — Telephone Encounter (Signed)
That is fine, thanks 

## 2011-10-23 NOTE — Telephone Encounter (Signed)
Pt seen 10/22/11; pt has slight scratchy throat this AM; pt did not want to buy any med; pt wanted to know if OK to gargle with warm salt water; advised OK and could use Hall sugar free cough drops as instructed for cough when needed.(pt already has cough drops). Pt will notify Dr Milinda Antis if condition worsens or changes.[facility].

## 2011-10-27 ENCOUNTER — Telehealth: Payer: Self-pay | Admitting: Family Medicine

## 2011-10-27 NOTE — Telephone Encounter (Signed)
Triage Call Report Triage Record Num: [id number] Operator: Tomasita Crumble Patient Name: Linda Thompson Call Date & Time: 10/25/2011 8:49:09PM Patient Phone: [phone] PCP: Audrie Gallus. Tower Patient Gender: Female PCP Fax : Patient DOB: [birth year] Practice Name: [facility] Reason for Call: Caller: [name]/Patient; PCP: Roxy Manns [facility]); CB#: [phone]; Call regarding Sore Throat; Onset 10/22/11; seen at office on 10/22/11. Denies fever. Pain rated at 5 of 10. Emergent symptoms ruled out. Home care and parameters for callback per Sore Throat or Hoarseness protocol. Caller voiced understanding. Protocol(s) Used: Sore Throat or Hoarseness Recommended Outcome per Protocol: Provide Home/Self Care Reason for Outcome: Sore throat AND no other symptoms Care Advice: Throat symptoms that do not resolve must be evaluated by provider to rule out serious causes such as cancer. Tobacco use in any form increases risk of cancer. ~ Analgesic/Antipyretic Advice - Acetaminophen: Consider acetaminophen as directed on label or by pharmacist/provider for pain or fever PRECAUTIONS: - Use if there is no history of liver disease, alcoholism, or intake of three or more alcohol drinks per day - Only if approved by provider during pregnancy or when breastfeeding - During pregnancy, acetaminophen should not be taken more than 3 consecutive days without telling provider - Do not exceed recommended dose or frequency ~ Sore Throat Relief: - Use warm salt water gargles 3 to 4 times/day, as needed (1/2 tsp. salt in 8 oz. [.2 liters] water). - Suck on hard candy, nonprescription or herbal throat lozenges (sugar-free if diabetic) - Eat soothing, soft food/fluids (broths, soups, or honey and lemon juice in hot tea, Popsicles, frozen yogurt or sherbet, scrambled eggs, cooked cereals, Jell-O or puddings) whichever is most comforting. - Avoid eating salty, spicy or acidic foods. ~ To help relieve nasal  congestion, use nonprescription saline nasal spray according to label instructions or as directed by a healthcare provider. ~ Analgesic/Antipyretic Advice - NSAIDs: Consider aspirin, ibuprofen, naproxen or ketoprofen for pain or fever as directed on label or by pharmacist/provider. PRECAUTIONS: - If over 84 years of age, should not take longer than 1 week without consulting provider. EXCEPTIONS: - Should not be used if taking blood thinners or have bleeding problems. - Do not use if have history of sensitivity/allergy to any of these medications; or history of cardiovascular, ulcer, kidney, liver disease or diabetes unless approved by provider. - Do not exceed recommended dose or frequency. ~ 10/25/2011 9:04:43PM Page 1 of 1 [email or web address]

## 2011-10-30 ENCOUNTER — Ambulatory Visit (INDEPENDENT_AMBULATORY_CARE_PROVIDER_SITE_OTHER): Payer: Medicare Other | Admitting: Family Medicine

## 2011-10-30 ENCOUNTER — Encounter: Payer: Self-pay | Admitting: Family Medicine

## 2011-10-30 ENCOUNTER — Telehealth: Payer: Self-pay | Admitting: Family Medicine

## 2011-10-30 VITALS — BP 132/80 | HR 93 | Temp 97.8°F | Wt 193.8 lb

## 2011-10-30 DIAGNOSIS — H6980 Other specified disorders of Eustachian tube, unspecified ear: Secondary | ICD-10-CM

## 2011-10-30 NOTE — Progress Notes (Signed)
She got a humidifier and vaseline to help with dry nasal passages.    R ear ringing but not painful.  No fevers.  No ST. No rhinorrhea.  She's better from prev URI vs allergy symptoms.  Has been gargling with salt water and taking tylenol.  No vertigo.   Meds, vitals, and allergies reviewed.   ROS: See HPI.  Otherwise, noncontributory.  GEN: nad, alert and oriented HEENT: mucous membranes moist, tm w/o erythema, weber louder on R side.  NECK: supple w/o LA CV: rrr PULM: ctab, no inc wob EXT: no edema SKIN: no acute rash

## 2011-10-30 NOTE — Telephone Encounter (Signed)
[facility] Triage Call Report Triage Record Num: [id number] Operator: Tomasita Crumble Patient Name: Linda Thompson Call Date & Time: 10/29/2011 9:38:31PM Patient Phone: [phone] PCP: Audrie Gallus. Tower Patient Gender: Female PCP Fax : Patient DOB: [birth year] Practice Name: [facility] Reason for Call: Caller: [name]/Patient; PCP: Roxy Manns [facility]); CB#: [phone]; Call regarding Diarrhea - BM 3-4 times; Onset 10/29/11. Denies fever. Emergent symptoms ruled out. Home care and parameters for callback given per Diarrhea or Other Change in Bowel Habits protocol. Protocol(s) Used: Diarrhea or Other Change in Bowel Habits Recommended Outcome per Protocol: Provide Home/Self Care Reason for Outcome: All other situations Care Advice: Rectal pain or change in bowel habits lasting more than a week or that recur may be the ONLY symptoms of colon lesions such as cancer. An evaluation by provider must be done. ~ ~ Call provider if diarrhea does not improve after 2 days of home care. ~ CAUTIONS ~ Avoid foods or drinks that cause or worsen symptoms. Diarrheal Care: - Drink 2-3 quarts (2-3 liters) per day of low sugar content fluids, including over the counter oral hydration solution, unless directed otherwise by provider. - If accompanied by vomiting, take the fluids in frequent small sips or suck on ice chips. - Eat easily digested foods (such as bananas, rice, applesauce, toast, cooked cereals, soup, crackers, baked or boiled potato, or baked chicken or [place] without skin). - Do not eat high fiber, high fat, high sugar content foods, or highly seasoned foods. - Do not drink caffeinated or alcoholic beverages. - Avoid milk and milk products while having symptoms. As symptoms improve, gradually add back to diet. - Application of A&D ointment or witch hazel medicated pads may help anal irritation. - Antidiarrheal medications are usually unnecessary. If symptoms are severe,  consider nonprescription antidiarrheal and anti-motility drugs as directed by label or a provider. Do not take if have high fever or bloody diarrhea. If pregnant, do not take any medications not approved by your provider. - Consult your provider for advice regarding continuing prescription medication. ~ 10/29/2011 9:49:50PM Page 1 of 1 [email or web address]

## 2011-10-30 NOTE — Patient Instructions (Addendum)
The ringing should gradually get better.  Take care.  If it is still going on in about 1 week, then notify Dr. Milinda Antis.

## 2011-10-31 DIAGNOSIS — H6980 Other specified disorders of Eustachian tube, unspecified ear: Secondary | ICD-10-CM | POA: Insufficient documentation

## 2011-10-31 NOTE — Assessment & Plan Note (Signed)
Likely R ETD, benign exam and should resolve.  If not, she'll notify PCP. She agrees.

## 2011-11-03 ENCOUNTER — Encounter: Payer: Self-pay | Admitting: *Deleted

## 2011-11-03 ENCOUNTER — Encounter: Payer: Medicare Other | Attending: Gastroenterology | Admitting: *Deleted

## 2011-11-03 VITALS — Ht 62.0 in | Wt 191.9 lb

## 2011-11-03 DIAGNOSIS — Z713 Dietary counseling and surveillance: Secondary | ICD-10-CM | POA: Insufficient documentation

## 2011-11-03 DIAGNOSIS — R197 Diarrhea, unspecified: Secondary | ICD-10-CM

## 2011-11-03 DIAGNOSIS — E119 Type 2 diabetes mellitus without complications: Secondary | ICD-10-CM | POA: Insufficient documentation

## 2011-11-03 NOTE — Patient Instructions (Addendum)
Ask Dr Milinda Antis to check HbA1C.  Then, if it's ok don't worry about checking blood sugar.  If it's not ok, I'll teach you how to use meter Limit starchy foods: breads, rice, cereals, pasta, corn, peas, grits, etc to 2 servings/meal Add protein: meat, nuts, or peanut butter, cheese to each meal Choose more fruits and vegetables Limit fats from butter and salad dressing or mayo. Smart Balance is still a fat- use small portion!! Or try butter spray Don't fry foods Aim to do chair exercises 5 days a week for 20 minutes Limit sugar to 0-9 g/serving and fats to 0-10 g/serving

## 2011-11-03 NOTE — Progress Notes (Signed)
  Medical Nutrition Therapy:  Appt start time: 0930 end time:  1030.   Assessment:  Primary concerns today: diabetes and persistent diarrhea.   MEDICATIONS: see list   DIETARY INTAKE:  Usual eating pattern includes 3 meals and 0 snacks per day.  Everyday foods include lean protein, vegetables.  Avoided foods include fruits, fried foods, sweets.    24-hr recall:  B ( AM): grits with butter with white toast and sugar-free jelly  Snk ( AM): none  L ( PM): vegetables with butter with chicken; sometimes subway 6 in; goes out on Sundays to [employer] ( PM): none D ( PM): chicken with vegetables with butter; gets hot meal from church once a month Snk ( PM): none Beverages: water, some soda, some unsweet tea,   Usual physical activity: none.  Doctor recommended activity, but Linda Thompson complains of leg pain  Estimated energy needs: 1400 calories 158 g carbohydrates 105 g protein 39 g fat  Progress Towards Goal(s):  In progress.   Nutritional Diagnosis:  NB-1.1 Food and nutrition-related knowledge deficit As related to proper balance of fats, carbohydrates, and proteins.  As evidenced by obesity, diabtes, and GI distress.    Intervention:  Nutrition counseling provided.  Morning is here with her friend and neighbor for nutrition counseling.  She was referred due to persistent diarrhea.  She also has diabetes and denies receiving any diabetes education.  Linda Thompson has some degree of cognitive delays and er friend insists on diabetes education.  Discussed (very simply) physiology of diabetes.  Discussed what foods are considered carbohydrates and what a serving of carbohydrates looks like.  Encouraged whole grains as much as possible.  Encouraged lean proteins.  Linda Thompson complains of diarrhea and gastroenterologist opinion is higher fat intake triggers GI distress.  Encouraged small portions of fats (oils, butters, margarine, salad dressing, etc)  Encouraged baking, broiling, or grilling and  now frying.  Encouraged more cooked vs raw vegetables to minimize ruffage. And to increase fruit consumption. Linda Thompson was advised to exercise but complains of pain in her leg.  Gave handout on chair exercises.  She wants to check her glucose, but was advised not to do so.  Referred to primary care for HbA1C test to determine needs to self monitoring.    Handouts given during visit include: Living Well with Diabetes Chair exercises  Monitoring/Evaluation:  Dietary intake, exercise, and body weight in 1 month(s).

## 2011-11-04 ENCOUNTER — Telehealth: Payer: Self-pay

## 2011-11-04 NOTE — Telephone Encounter (Signed)
Pt took Loperamide last night due to going to eating function at church; pt did not have diarrhea. Advised pt Dr Milinda Antis and Dr Arlyce Dice had told pt in past OK to take if diarrhea episode. Pt did not want to have accident. Pt already has appt scheduled with Dr Milinda Antis 11/07/11. Pt had normal BM this AM. Advised pt to take Loperamide if has diarrhea.pt voiced understanding.

## 2011-11-05 ENCOUNTER — Other Ambulatory Visit: Payer: Self-pay | Admitting: Family Medicine

## 2011-11-07 ENCOUNTER — Encounter: Payer: Self-pay | Admitting: Family Medicine

## 2011-11-07 ENCOUNTER — Ambulatory Visit (INDEPENDENT_AMBULATORY_CARE_PROVIDER_SITE_OTHER): Payer: Medicare Other | Admitting: Family Medicine

## 2011-11-07 VITALS — BP 114/82 | HR 84 | Temp 97.6°F | Ht 62.0 in | Wt 192.8 lb

## 2011-11-07 DIAGNOSIS — E119 Type 2 diabetes mellitus without complications: Secondary | ICD-10-CM | POA: Diagnosis not present

## 2011-11-07 LAB — HEMOGLOBIN A1C: Hgb A1c MFr Bld: 6.4 % (ref 4.6–6.5)

## 2011-11-07 LAB — MICROALBUMIN / CREATININE URINE RATIO: Microalb, Ur: 2.6 mg/dL — ABNORMAL HIGH (ref 0.0–1.9)

## 2011-11-07 NOTE — Patient Instructions (Addendum)
Labs today Keep eating a healthy diet  Try to be as active as possible No change in medicine  Keep seeing the dietician- Linda Thompson as scheduled  Hold off on checking sugars until we see how your labs look and instruct you further

## 2011-11-07 NOTE — Progress Notes (Signed)
Subjective:    Patient ID: Linda Thompson, female    DOB: [birth year], 57 y.o.   MRN: [id number]  HPI  Pt here for f/u after seeing a dietician for diabetes -- saw Denny Levy She explained optimal diet -guidelines for less starchy foods and smaller portoin Last a1c in April was 6.4  Was told that if a1c is not up - may not need to check her sugars   Wt is up 1 lb with bmi of 35 Does not exercise currently due to chronic back and leg pain   On metformin  Is ace allergic- cannot have that for renal protection Eye exam is up to date    Patient Active Problem List  Diagnosis  . FUNGAL DERMATITIS  . MENINGIOMA  . DIABETES MELLITUS, TYPE II  . HYPERLIPIDEMIA  . MENTAL RETARDATION, MODERATE  . GLAUCOMA NOS  . HYPERTENSION  . ALLERGIC  RHINITIS  . GERD  . HIATAL HERNIA  . DERMATITIS, SEBORRHEIC NOS  . PRURITUS ANI  . OSTEOARTHRITIS, FOOT  . HEART MURMUR  . TRANSAMINASES, SERUM, ELEVATED  . Personal history of other musculoskeletal disorders  . IRRITABLE BOWEL SYNDROME  . Hypokalemia  . Back pain  . Candidal intertrigo  . Obesity  . Tinea pedis  . Insect bites  . Acute bronchitis, bacterial  . Viral sore throat  . Abdominal  pain, other specified site  . Diarrhea  . Chronic dermatitis of hands  . UTI (lower urinary tract infection)  . Cough  . ETD (eustachian tube dysfunction)   Past Medical History  Diagnosis Date  . Allergic rhinitis, cause unspecified   . Pain in joint, ankle and foot   . Backache, unspecified     chronic LBP with radiculopathy  . Seborrheic dermatitis, unspecified   . Type II or unspecified type diabetes mellitus without mention of complication, not stated as uncontrolled   . History of fracture of arm     Right  . History of fracture of foot   . Dermatomycosis, unspecified   . Esophageal reflux   . Unspecified glaucoma(365.9)   . Undiagnosed cardiac murmurs   . Diaphragmatic hernia without mention of obstruction or gangrene   .  Other and unspecified hyperlipidemia   . Unspecified essential hypertension   . Irritable bowel syndrome   . Benign neoplasm of cerebral meninges   . Moderate intellectual disabilities   . Nausea alone   . Osteoarthrosis, unspecified whether generalized or localized, ankle and foot   . Pruritus ani   . Unspecified tinnitus   . Nonspecific elevation of levels of transaminase or lactic acid dehydrogenase (LDH)   . Leukorrhea, not specified as infective   . Obesity   . Hyperglycemia   . DDD (degenerative disc disease), lumbar    Past Surgical History  Procedure Date  . Total abdominal hysterectomy   . Cataract extraction   . Elbow surgery     right elbow   History  Substance Use Topics  . Smoking status: Never Smoker   . Smokeless tobacco: Never Used  . Alcohol Use: No   Family History  Problem Relation Age of Onset  . Diabetes Mother   . Diabetes Maternal Aunt   . Heart disease Maternal Aunt      (had pacemaker)  . Diabetes Maternal Grandmother   . Stroke Mother   . Stroke Father   . Stroke Maternal Grandmother    Allergies  Allergen Reactions  . Ace Inhibitors     REACTION:  COUGH  . Amoxicillin-Pot Clavulanate     REACTION: diarrhea  . Amoxicillin-Pot Clavulanate Other (See Comments)    Pt unaware of reaction to augmentin  . Azithromycin Diarrhea  . Cyclobenzaprine Hcl     REACTION: diarrhea  . Flexeril (Cyclobenzaprine)   . Flonase (Fluticasone Propionate)     nosebleed  . Ibuprofen     REACTION: vomiting   Current Outpatient Prescriptions on File Prior to Visit  Medication Sig Dispense Refill  . ACCU-CHEK AVIVA PLUS test strip USE EVERY DAY TO CHECK GLUCOSE AND TO CHECK AS NEEDED  100 strip  2  . aspirin 81 MG tablet Take 81 mg by mouth daily.        Marland Kitchen diltiazem (CARDIZEM CD) 240 MG 24 hr capsule Take 240 mg by mouth daily.      . hydrochlorothiazide (MICROZIDE) 12.5 MG capsule Take 12.5 mg by mouth daily.      Marland Kitchen ketoconazole (NIZORAL) 2 % shampoo Apply  1 application topically 2 (two) times a week.      . latanoprost (XALATAN) 0.005 % ophthalmic solution Place 1 drop into both eyes at bedtime.       Marland Kitchen loperamide (IMODIUM) 1 MG/5ML solution Take 1 mg by mouth 4 (four) times daily as needed.      . metFORMIN (GLUCOPHAGE) 500 MG tablet Take 1 tablet (500 mg total) by mouth 2 (two) times daily with a meal.  180 tablet  3  . omeprazole (PRILOSEC) 20 MG capsule Take 1 capsule (20 mg total) by mouth 2 (two) times daily.  60 capsule  11  . potassium chloride SA (K-DUR,KLOR-CON) 20 MEQ tablet Take 20 mEq by mouth daily.      . pravastatin (PRAVACHOL) 40 MG tablet Take 1 tablet (40 mg total) by mouth every evening.  30 tablet  11  . PROAIR HFA 108 (90 BASE) MCG/ACT inhaler Ad lib.      . promethazine (PHENERGAN) 25 MG tablet Take 25 mg by mouth every 6 (six) hours as needed.      Marland Kitchen Propylene Glycol (SYSTANE BALANCE OP) Apply to eye.      Marland Kitchen DISCONTD: Calcium Carbonate-Vitamin D (CALCIUM-VITAMIN D) 500-200 MG-UNIT per tablet Take 1 tablet by mouth 2 (two) times daily with a meal.        . DISCONTD: fexofenadine (ALLEGRA) 180 MG tablet Take 1 tablet (180 mg total) by mouth daily.  15 tablet  0       Review of Systems Review of Systems  Constitutional: Negative for fever, appetite change, fatigue and unexpected weight change.  Eyes: Negative for pain and visual disturbance.  Respiratory: Negative for cough and shortness of breath.   Cardiovascular: Negative for cp or palpitations    Gastrointestinal: Negative for nausea, diarrhea and constipation. (bowels are better now)  Genitourinary: Negative for urgency and frequency. no excessive thirst or urination Skin: Negative for pallor or rash   Neurological: Negative for weakness, light-headedness, numbness and headaches.  Hematological: Negative for adenopathy. Does not bruise/bleed easily.  Psychiatric/Behavioral: Negative for dysphoric mood. The patient is not nervous/anxious.         Objective:    Physical Exam  Constitutional: She appears well-developed and well-nourished. No distress.       obese and well appearing   HENT:  Head: Normocephalic and atraumatic.  Mouth/Throat: Oropharynx is clear and moist.  Eyes: Conjunctivae normal and EOM are normal. Pupils are equal, round, and reactive to light. Right eye exhibits no discharge. Left eye exhibits  no discharge.  Neck: Normal range of motion. Neck supple. No JVD present. Carotid bruit is not present. No thyromegaly present.  Cardiovascular: Normal rate, regular rhythm, normal heart sounds and intact distal pulses.  Exam reveals no gallop.   Pulmonary/Chest: Effort normal and breath sounds normal. No respiratory distress. She has no wheezes.  Abdominal: Soft. Bowel sounds are normal. She exhibits no abdominal bruit.  Musculoskeletal: She exhibits no edema.  Lymphadenopathy:    She has no cervical adenopathy.  Neurological: She is alert. She has normal reflexes.  Skin: Skin is warm and dry. No rash noted. No pallor.  Psychiatric: She has a normal mood and affect.       Baseline MR Repeats phrases frequently          Assessment & Plan:

## 2011-11-07 NOTE — Assessment & Plan Note (Signed)
Pt saw a dietician and will have f/u Has been inst in how to use glucometer - but if a1c is stable- probably does not need to  Lab today a1c and microalb (pt cannot take ace) Disc imp of exercise as tolerated and wt loss

## 2011-11-10 ENCOUNTER — Encounter: Payer: Self-pay | Admitting: *Deleted

## 2011-11-11 ENCOUNTER — Other Ambulatory Visit: Payer: Self-pay | Admitting: Family Medicine

## 2011-11-12 ENCOUNTER — Other Ambulatory Visit: Payer: Self-pay | Admitting: *Deleted

## 2011-11-12 ENCOUNTER — Other Ambulatory Visit: Payer: Self-pay

## 2011-11-12 MED ORDER — OMEPRAZOLE 20 MG PO CPDR: 20.0000 mg | DELAYED_RELEASE_CAPSULE | Freq: Two times a day (BID) | ORAL | Status: DC

## 2011-11-12 MED ORDER — DILTIAZEM HCL ER COATED BEADS 240 MG PO CP24: 240.0000 mg | ORAL_CAPSULE | Freq: Every day | ORAL | Status: DC

## 2011-11-12 MED ORDER — POTASSIUM CHLORIDE CRYS ER 20 MEQ PO TBCR: 20.0000 meq | EXTENDED_RELEASE_TABLET | Freq: Every day | ORAL | Status: DC

## 2011-11-12 MED ORDER — PRAVASTATIN SODIUM 40 MG PO TABS: 40.0000 mg | ORAL_TABLET | Freq: Every evening | ORAL | Status: DC

## 2011-11-12 MED ORDER — HYDROCHLOROTHIAZIDE 12.5 MG PO CAPS: 12.5000 mg | ORAL_CAPSULE | Freq: Every day | ORAL | Status: DC

## 2011-11-12 NOTE — Telephone Encounter (Signed)
Will refill electronically  

## 2011-11-12 NOTE — Telephone Encounter (Signed)
Pt request 3 month rx for pravastatin,HCTZ,potassium, omeprazole and diltiazem to [facility].Please advise.

## 2011-11-14 ENCOUNTER — Ambulatory Visit (INDEPENDENT_AMBULATORY_CARE_PROVIDER_SITE_OTHER): Payer: Medicare Other | Admitting: Family Medicine

## 2011-11-14 ENCOUNTER — Encounter: Payer: Self-pay | Admitting: Family Medicine

## 2011-11-14 VITALS — BP 132/76 | HR 80 | Temp 97.6°F | Ht 62.0 in | Wt 194.8 lb

## 2011-11-14 DIAGNOSIS — B372 Candidiasis of skin and nail: Secondary | ICD-10-CM | POA: Diagnosis not present

## 2011-11-14 DIAGNOSIS — R109 Unspecified abdominal pain: Secondary | ICD-10-CM | POA: Diagnosis not present

## 2011-11-14 LAB — POCT URINALYSIS DIPSTICK
Bilirubin, UA: NEGATIVE
Ketones, UA: NEGATIVE
Protein, UA: NEGATIVE

## 2011-11-14 NOTE — Assessment & Plan Note (Signed)
Several episodes of fleeting pain in RLQ of abdomen Resolved now  Nl exam-no tenderness at all  Suspect this was gas -due to her newer increased fiber diet that she follows from the dietician Rev red flags to watch for

## 2011-11-14 NOTE — Progress Notes (Signed)
Subjective:    Patient ID: Linda Thompson, female    DOB: 04/19/1955, 56 y.o.   MRN: [id number]  HPI Has a "little pain" in lower right abdomen - just under where her stomach goes over her pants  Happened yesterday briefly Then today- just fleeting on and off every once in a while Not hurting right now  No diarrhea or constipation No blood in stool or dark stool No n/v No fever Feels fine  Has gas occas   Has GI f/u on Tuesday (neighbor is going with her)   Patient Active Problem List  Diagnosis  . FUNGAL DERMATITIS  . MENINGIOMA  . DIABETES MELLITUS, TYPE II  . HYPERLIPIDEMIA  . MENTAL RETARDATION, MODERATE  . GLAUCOMA NOS  . HYPERTENSION  . ALLERGIC  RHINITIS  . GERD  . HIATAL HERNIA  . DERMATITIS, SEBORRHEIC NOS  . PRURITUS ANI  . OSTEOARTHRITIS, FOOT  . HEART MURMUR  . TRANSAMINASES, SERUM, ELEVATED  . Personal history of other musculoskeletal disorders  . IRRITABLE BOWEL SYNDROME  . Hypokalemia  . Back pain  . Candidal intertrigo  . Obesity  . Tinea pedis  . Insect bites  . Acute bronchitis, bacterial  . Viral sore throat  . Abdominal  pain, other specified site  . Diarrhea  . Chronic dermatitis of hands  . UTI (lower urinary tract infection)  . Cough  . ETD (eustachian tube dysfunction)   Past Medical History  Diagnosis Date  . Allergic rhinitis, cause unspecified   . Pain in joint, ankle and foot   . Backache, unspecified     chronic LBP with radiculopathy  . Seborrheic dermatitis, unspecified   . Type II or unspecified type diabetes mellitus without mention of complication, not stated as uncontrolled   . History of fracture of arm     Right  . History of fracture of foot   . Dermatomycosis, unspecified   . Esophageal reflux   . Unspecified glaucoma(365.9)   . Undiagnosed cardiac murmurs   . Diaphragmatic hernia without mention of obstruction or gangrene   . Other and unspecified hyperlipidemia   . Unspecified essential hypertension   .  Irritable bowel syndrome   . Benign neoplasm of cerebral meninges   . Moderate intellectual disabilities   . Nausea alone   . Osteoarthrosis, unspecified whether generalized or localized, ankle and foot   . Pruritus ani   . Unspecified tinnitus   . Nonspecific elevation of levels of transaminase or lactic acid dehydrogenase (LDH)   . Leukorrhea, not specified as infective   . Obesity   . Hyperglycemia   . DDD (degenerative disc disease), lumbar    Past Surgical History  Procedure Date  . Total abdominal hysterectomy   . Cataract extraction   . Elbow surgery     right elbow   History  Substance Use Topics  . Smoking status: Never Smoker   . Smokeless tobacco: Never Used  . Alcohol Use: No   Family History  Problem Relation Age of Onset  . Diabetes Mother   . Diabetes Maternal Aunt   . Heart disease Maternal Aunt      (had pacemaker)  . Diabetes Maternal Grandmother   . Stroke Mother   . Stroke Father   . Stroke Maternal Grandmother    Allergies  Allergen Reactions  . Ace Inhibitors     REACTION: COUGH  . Amoxicillin-Pot Clavulanate     REACTION: diarrhea  . Amoxicillin-Pot Clavulanate Other (See Comments)  Pt unaware of reaction to augmentin  . Azithromycin Diarrhea  . Cyclobenzaprine Hcl     REACTION: diarrhea  . Flexeril (Cyclobenzaprine)   . Flonase (Fluticasone Propionate)     nosebleed  . Ibuprofen     REACTION: vomiting   Current Outpatient Prescriptions on File Prior to Visit  Medication Sig Dispense Refill  . ACCU-CHEK AVIVA PLUS test strip USE EVERY DAY TO CHECK GLUCOSE AND TO CHECK AS NEEDED  100 strip  2  . aspirin 81 MG tablet Take 81 mg by mouth daily.        Marland Kitchen diltiazem (CARDIZEM CD) 240 MG 24 hr capsule Take 1 capsule (240 mg total) by mouth daily.  90 capsule  3  . hydrochlorothiazide (MICROZIDE) 12.5 MG capsule Take 1 capsule (12.5 mg total) by mouth daily.  90 capsule  3  . ketoconazole (NIZORAL) 2 % shampoo Apply 1 application topically  2 (two) times a week.      Marland Kitchen ketoconazole (NIZORAL) 2 % shampoo USE AS DIRECTED TWICE WEEKLY PRN  120 mL  3  . latanoprost (XALATAN) 0.005 % ophthalmic solution Place 1 drop into both eyes at bedtime.       Marland Kitchen loperamide (IMODIUM) 1 MG/5ML solution Take 1 mg by mouth 4 (four) times daily as needed.      . metFORMIN (GLUCOPHAGE) 500 MG tablet Take 1 tablet (500 mg total) by mouth 2 (two) times daily with a meal.  180 tablet  3  . omeprazole (PRILOSEC) 20 MG capsule Take 1 capsule (20 mg total) by mouth 2 (two) times daily.  180 capsule  3  . potassium chloride SA (K-DUR,KLOR-CON) 20 MEQ tablet Take 1 tablet (20 mEq total) by mouth daily.  90 tablet  3  . pravastatin (PRAVACHOL) 40 MG tablet Take 1 tablet (40 mg total) by mouth every evening.  90 tablet  3  . PROAIR HFA 108 (90 BASE) MCG/ACT inhaler Ad lib.      . promethazine (PHENERGAN) 25 MG tablet Take 25 mg by mouth every 6 (six) hours as needed.      Marland Kitchen Propylene Glycol (SYSTANE BALANCE OP) Apply to eye.      Marland Kitchen DISCONTD: Calcium Carbonate-Vitamin D (CALCIUM-VITAMIN D) 500-200 MG-UNIT per tablet Take 1 tablet by mouth 2 (two) times daily with a meal.        . DISCONTD: fexofenadine (ALLEGRA) 180 MG tablet Take 1 tablet (180 mg total) by mouth daily.  15 tablet  0      Review of Systems Review of Systems  Constitutional: Negative for fever, appetite change, fatigue and unexpected weight change.  Eyes: Negative for pain and visual disturbance.  Respiratory: Negative for cough and shortness of breath.   Cardiovascular: Negative for cp or palpitations    Gastrointestinal: Negative for nausea, diarrhea and constipation. neg for blood in stool or dark stool Genitourinary: Negative for urgency and frequency.  Skin: Negative for pallor or rash   Neurological: Negative for weakness, light-headedness, numbness and headaches.  Hematological: Negative for adenopathy. Does not bruise/bleed easily.  Psychiatric/Behavioral: Negative for dysphoric mood.  The patient is not nervous/anxious.         Objective:   Physical Exam  Constitutional: She appears well-developed and well-nourished. No distress.  HENT:  Head: Normocephalic and atraumatic.  Mouth/Throat: Oropharynx is clear and moist.  Eyes: Conjunctivae normal and EOM are normal. Pupils are equal, round, and reactive to light. Right eye exhibits no discharge. Left eye exhibits no discharge.  Neck:  Normal range of motion. Neck supple. No JVD present. Carotid bruit is not present. No thyromegaly present.  Cardiovascular: Normal rate, regular rhythm, normal heart sounds and intact distal pulses.  Exam reveals no gallop.   Pulmonary/Chest: Effort normal and breath sounds normal. No respiratory distress. She has no wheezes.  Abdominal: Soft. Bowel sounds are normal. She exhibits no shifting dullness, no distension, no abdominal bruit, no ascites and no mass. There is no hepatosplenomegaly. There is no tenderness. There is no rebound and no guarding.  Musculoskeletal: She exhibits no edema and no tenderness.  Lymphadenopathy:    She has no cervical adenopathy.  Neurological: She is alert. She has normal reflexes. No cranial nerve deficit. She exhibits normal muscle tone. Coordination normal.  Skin: Skin is warm and dry. Rash noted. No erythema. No pallor.       Some erythema of skin in thigh folds consistent with intertrigo, which she has had before   Psychiatric: She has a normal mood and affect.          Assessment & Plan:

## 2011-11-14 NOTE — Patient Instructions (Addendum)
I think your abdominal pain was from some gas  I'm glad it is better  If this happens again or if it persists (stays) or gets worse let me know  If other symptoms -like fever or nausea and vomiting please call as well See your GI doctor on Tuesday as planned  Please use the cream you have for the yeast infection in groin (ketoconazole) Keep the area as clean and dry as possible

## 2011-11-16 NOTE — Assessment & Plan Note (Signed)
Mild - but present in thigh folds (recurrent) Disc keeping are clean and dry  Ketoconazole cream qd until clear Update if not starting to improve in a week or if worsening

## 2011-11-18 ENCOUNTER — Ambulatory Visit (INDEPENDENT_AMBULATORY_CARE_PROVIDER_SITE_OTHER): Payer: Medicare Other | Admitting: Gastroenterology

## 2011-11-18 ENCOUNTER — Encounter: Payer: Self-pay | Admitting: Gastroenterology

## 2011-11-18 VITALS — BP 124/60 | HR 92 | Ht 62.0 in | Wt 191.0 lb

## 2011-11-18 DIAGNOSIS — K589 Irritable bowel syndrome without diarrhea: Secondary | ICD-10-CM | POA: Diagnosis not present

## 2011-11-18 NOTE — Progress Notes (Signed)
History of Present Illness:  This is a followup appointment for diarrhea. Linda Thompson underwent counseling by dietary and has noted significant improvement in her diarrhea. She clearly does not digest certain foods. With avoidance stools are solid.    Review of Systems: Pertinent positive and negative review of systems were noted in the above HPI section. All other review of systems were otherwise negative.    Current Medications, Allergies, Past Medical History, Past Surgical History, Family History and Social History were reviewed in [employer] electronic medical record  Vital signs were reviewed in today's medical record. Physical Exam: General: Well developed , well nourished, no acute distress

## 2011-11-18 NOTE — Patient Instructions (Signed)
Follow up as needed

## 2011-11-18 NOTE — Assessment & Plan Note (Signed)
Significantly improved. Patient was cautioned to avoid fatty and fried foods.

## 2011-12-04 ENCOUNTER — Encounter: Payer: Medicare Other | Attending: Gastroenterology | Admitting: *Deleted

## 2011-12-04 VITALS — Ht 62.0 in | Wt 192.6 lb

## 2011-12-04 DIAGNOSIS — E119 Type 2 diabetes mellitus without complications: Secondary | ICD-10-CM | POA: Diagnosis not present

## 2011-12-04 DIAGNOSIS — Z713 Dietary counseling and surveillance: Secondary | ICD-10-CM | POA: Insufficient documentation

## 2011-12-04 NOTE — Patient Instructions (Addendum)
Grocery list: Canned green beans Canned asparagus  Frozen broccoli, okra (not fried), spinach, turnip greens, cauliflower  Try to have a vegetable at lunch and dinner  Walk 20 minutes every day, twice a day

## 2011-12-04 NOTE — Progress Notes (Signed)
  Medical Nutrition Therapy:  Appt start time: 0900 end time:  0930.  Assessment:  Primary concerns today: diabetes follow up.   MEDICATIONS: see list    DIETARY INTAKE:  Usual eating pattern includes 3 meals and 0 snacks per day.   Everyday foods include: starches, protein, some vegetables. Avoided foods include fried foods, sweets.   24-hr recall:  B ( AM): grits with butter with white toast and sugar-free jelly  Snk ( AM): none  L ( PM): sometimes subway 6 in; sometimes gets a microwaveable meal; goes out on Sundays to [employer] ( PM): none  D ( PM): chicken with vegetables with butter; gets hot meal from church once a month; sometimes goes out Snk ( PM): none  Beverages: water, some soda, some unsweet tea,   Usual physical activity: says she walks 20 minutes most days  Estimated energy needs:  1400 calories  158 g carbohydrates  105 g protein  39 g fat   Progress Towards Goal(s):  Some progress.   Nutritional Diagnosis:  NB-1.1 Food and nutrition-related knowledge deficit As related to proper balance of fats, carbohydrates, and proteins. As evidenced by obesity and diabetes     Intervention:  Nutrition counseling provided.  Linda Thompson is here for a follow up related to her diabetes.  She's had a recent HbA1c test of 6.4%.  She does not need to monitor her glucose at home.  She eats 3 meals and limits fried foods.  Encouraged more vegetables at lunch and dinner and encouraged her to walk 20 minutes BID to strengthen her leg and facilitate weight loss.    Monitoring/Evaluation:  Dietary intake, exercise, and body weight in 3 month(s).

## 2011-12-05 ENCOUNTER — Ambulatory Visit (INDEPENDENT_AMBULATORY_CARE_PROVIDER_SITE_OTHER): Payer: Medicare Other | Admitting: Family Medicine

## 2011-12-05 ENCOUNTER — Encounter: Payer: Self-pay | Admitting: Family Medicine

## 2011-12-05 VITALS — BP 132/84 | HR 74 | Temp 98.0°F | Ht 62.0 in | Wt 194.2 lb

## 2011-12-05 DIAGNOSIS — M549 Dorsalgia, unspecified: Secondary | ICD-10-CM

## 2011-12-05 NOTE — Patient Instructions (Addendum)
Keep walking - this will help loosen up your back  Also use heat on your back --- try a heating pad on low setting , for 10 minutes at a time Let me know if symptoms worsen or change  Let Dr Ophelia Charter know if back pain gets much worse

## 2011-12-05 NOTE — Assessment & Plan Note (Signed)
Pt has hx of deg disk and joint dz in back- ongoing f/u with Dr Ophelia Charter  We disc her intermittent pain  I adv using warm compress 10 min at a time to relax the muscles in her back  Also to continue walking  Update if not starting to improve in a week or if worsening

## 2011-12-05 NOTE — Progress Notes (Signed)
Subjective:    Patient ID: Linda Thompson, female    DOB: 1955/10/02, 56 y.o.   MRN: [id number]  HPI Here for pain in her "side"- she points to her R buttock and lower back Has known deg disc and spine dz  Has not seen Dr Ophelia Charter recently   Has to lie on the other side- feels better  Hurts to bend  No rad to leg ? If from change in  No recent injury or new activity Is trying to walk a bit more   In addition she occ coughs up a little clear phlegm- in ams  No fever or other symptoms  No runny nose or ST  Patient Active Problem List  Diagnosis  . FUNGAL DERMATITIS  . MENINGIOMA  . DIABETES MELLITUS, TYPE II  . HYPERLIPIDEMIA  . MENTAL RETARDATION, MODERATE  . GLAUCOMA NOS  . HYPERTENSION  . ALLERGIC  RHINITIS  . GERD  . HIATAL HERNIA  . DERMATITIS, SEBORRHEIC NOS  . PRURITUS ANI  . OSTEOARTHRITIS, FOOT  . HEART MURMUR  . TRANSAMINASES, SERUM, ELEVATED  . Personal history of other musculoskeletal disorders  . IRRITABLE BOWEL SYNDROME  . Hypokalemia  . Back pain  . Candidal intertrigo  . Obesity  . Tinea pedis  . Insect bites  . Acute bronchitis, bacterial  . Viral sore throat  . Abdominal  pain, other specified site  . Diarrhea  . Chronic dermatitis of hands  . UTI (lower urinary tract infection)  . Cough  . ETD (eustachian tube dysfunction)   Past Medical History  Diagnosis Date  . Allergic rhinitis, cause unspecified   . Pain in joint, ankle and foot   . Backache, unspecified     chronic LBP with radiculopathy  . Seborrheic dermatitis, unspecified   . Type II or unspecified type diabetes mellitus without mention of complication, not stated as uncontrolled   . History of fracture of arm     Right  . History of fracture of foot   . Dermatomycosis, unspecified   . Esophageal reflux   . Unspecified glaucoma(365.9)   . Undiagnosed cardiac murmurs   . Diaphragmatic hernia without mention of obstruction or gangrene   . Other and unspecified hyperlipidemia    . Unspecified essential hypertension   . Irritable bowel syndrome   . Benign neoplasm of cerebral meninges   . Moderate intellectual disabilities   . Nausea alone   . Osteoarthrosis, unspecified whether generalized or localized, ankle and foot   . Pruritus ani   . Unspecified tinnitus   . Nonspecific elevation of levels of transaminase or lactic acid dehydrogenase (LDH)   . Leukorrhea, not specified as infective   . Obesity   . Hyperglycemia   . DDD (degenerative disc disease), lumbar    Past Surgical History  Procedure Date  . Total abdominal hysterectomy   . Cataract extraction   . Elbow surgery     right elbow   History  Substance Use Topics  . Smoking status: Never Smoker   . Smokeless tobacco: Never Used  . Alcohol Use: No   Family History  Problem Relation Age of Onset  . Diabetes Mother   . Diabetes Maternal Aunt   . Heart disease Maternal Aunt      (had pacemaker)  . Diabetes Maternal Grandmother   . Stroke Mother   . Stroke Father   . Stroke Maternal Grandmother    Allergies  Allergen Reactions  . Ace Inhibitors     REACTION:  COUGH  . Amoxicillin-Pot Clavulanate     REACTION: diarrhea  . Amoxicillin-Pot Clavulanate Other (See Comments)    Pt unaware of reaction to augmentin  . Azithromycin Diarrhea  . Cyclobenzaprine Hcl     REACTION: diarrhea  . Flexeril (Cyclobenzaprine)   . Flonase (Fluticasone Propionate)     nosebleed  . Ibuprofen     REACTION: vomiting   Current Outpatient Prescriptions on File Prior to Visit  Medication Sig Dispense Refill  . ACCU-CHEK AVIVA PLUS test strip USE EVERY DAY TO CHECK GLUCOSE AND TO CHECK AS NEEDED  100 strip  2  . aspirin 81 MG tablet Take 81 mg by mouth daily.        Marland Kitchen diltiazem (CARDIZEM CD) 240 MG 24 hr capsule Take 1 capsule (240 mg total) by mouth daily.  90 capsule  3  . hydrochlorothiazide (MICROZIDE) 12.5 MG capsule Take 1 capsule (12.5 mg total) by mouth daily.  90 capsule  3  . ketoconazole  (NIZORAL) 2 % shampoo Apply 1 application topically 2 (two) times a week.      Marland Kitchen ketoconazole (NIZORAL) 2 % shampoo USE AS DIRECTED TWICE WEEKLY PRN  120 mL  3  . latanoprost (XALATAN) 0.005 % ophthalmic solution Place 1 drop into both eyes at bedtime.       Marland Kitchen loperamide (IMODIUM) 1 MG/5ML solution Take 1 mg by mouth 4 (four) times daily as needed.      . metFORMIN (GLUCOPHAGE) 500 MG tablet Take 1 tablet (500 mg total) by mouth 2 (two) times daily with a meal.  180 tablet  3  . omeprazole (PRILOSEC) 20 MG capsule Take 1 capsule (20 mg total) by mouth 2 (two) times daily.  180 capsule  3  . potassium chloride SA (K-DUR,KLOR-CON) 20 MEQ tablet Take 1 tablet (20 mEq total) by mouth daily.  90 tablet  3  . pravastatin (PRAVACHOL) 40 MG tablet Take 1 tablet (40 mg total) by mouth every evening.  90 tablet  3  . PROAIR HFA 108 (90 BASE) MCG/ACT inhaler Ad lib.      . promethazine (PHENERGAN) 25 MG tablet Take 25 mg by mouth every 6 (six) hours as needed.      Marland Kitchen Propylene Glycol (SYSTANE BALANCE OP) Apply to eye.      . [DISCONTINUED] Calcium Carbonate-Vitamin D (CALCIUM-VITAMIN D) 500-200 MG-UNIT per tablet Take 1 tablet by mouth 2 (two) times daily with a meal.        . [DISCONTINUED] fexofenadine (ALLEGRA) 180 MG tablet Take 1 tablet (180 mg total) by mouth daily.  15 tablet  0     Review of Systems Review of Systems  Constitutional: Negative for fever, appetite change, fatigue and unexpected weight change.  Eyes: Negative for pain and visual disturbance.  Respiratory: Negative for wheeze  and shortness of breath.   Cardiovascular: Negative for cp or palpitations    Gastrointestinal: Negative for nausea, diarrhea and constipation.  Genitourinary: Negative for urgency and frequency.  Skin: Negative for pallor or rash   MSK pos for back pain, neg for hip pain or swollen joints  Neurological: Negative for weakness, light-headedness, numbness and headaches.  Hematological: Negative for adenopathy.  Does not bruise/bleed easily.  Psychiatric/Behavioral: Negative for dysphoric mood. The patient is not nervous/anxious.         Objective:   Physical Exam  Constitutional: She appears well-developed and well-nourished. No distress.       obese and well appearing   HENT:  Head:  Normocephalic and atraumatic.  Mouth/Throat: Oropharynx is clear and moist.       Nares are boggy  Eyes: Conjunctivae normal and EOM are normal. Pupils are equal, round, and reactive to light. Right eye exhibits no discharge. Left eye exhibits no discharge.  Neck: Normal range of motion. Neck supple. No thyromegaly present.  Cardiovascular: Normal rate and regular rhythm.   Pulmonary/Chest: Effort normal and breath sounds normal. No respiratory distress. She has no wheezes.  Abdominal: Soft. Bowel sounds are normal. She exhibits no distension. There is no tenderness.       No suprapubic tenderness or fullness    Musculoskeletal: She exhibits tenderness. She exhibits no edema.       Lumbar back: She exhibits tenderness and spasm. She exhibits normal range of motion, no bony tenderness, no swelling, no edema and no deformity.       Tenderness in R piriformis area - with spasm noted  Nl rom LS and hip  Neg SLR  Nl gait No bony tenderness   Lymphadenopathy:    She has no cervical adenopathy.  Neurological: She is alert. She has normal strength and normal reflexes. She displays no atrophy. No sensory deficit. She exhibits normal muscle tone.  Skin: Skin is warm and dry. No rash noted. No erythema.  Psychiatric: She has a normal mood and affect.       Baseline MR Repeats statements often          Assessment & Plan:

## 2011-12-11 ENCOUNTER — Telehealth: Payer: Self-pay

## 2011-12-11 NOTE — Telephone Encounter (Signed)
thatnks- those are appropriate answers to her questions

## 2011-12-11 NOTE — Telephone Encounter (Signed)
Pt said urinated and had normal BM at same time this AM; pt wants to know if that is OK. I told pt yes that is normal. Pt has changed diet eating more fruit and vegetables and no fried foods. Pt said she knows she can take Loperamide if has diarrhea but pt wants to know if Dr Milinda Antis thinks pt being nervous could cause her to have diarrhea. Advised pt nervousness can cause stomach upset. Pt has seen stomach doctor recently and if pt has more stomach problems pt will call GI doctor. Linda Thompson wanted Dr Milinda Antis to know about these issues also.

## 2011-12-14 ENCOUNTER — Emergency Department (INDEPENDENT_AMBULATORY_CARE_PROVIDER_SITE_OTHER)
Admission: EM | Admit: 2011-12-14 | Discharge: 2011-12-14 | Disposition: A | Discharge status: Home / Self Care | Payer: Medicare Other | Source: Home / Self Care | Attending: Emergency Medicine | Admitting: Emergency Medicine

## 2011-12-14 ENCOUNTER — Encounter (HOSPITAL_COMMUNITY): Payer: Self-pay | Admitting: *Deleted

## 2011-12-14 DIAGNOSIS — B3789 Other sites of candidiasis: Secondary | ICD-10-CM

## 2011-12-14 DIAGNOSIS — E119 Type 2 diabetes mellitus without complications: Secondary | ICD-10-CM | POA: Diagnosis not present

## 2011-12-14 DIAGNOSIS — B373 Candidiasis of vulva and vagina: Secondary | ICD-10-CM

## 2011-12-14 LAB — OCCULT BLOOD, POC DEVICE: Fecal Occult Bld: NEGATIVE

## 2011-12-14 MED ORDER — KETOCONAZOLE 200 MG PO TABS: 200.0000 mg | ORAL_TABLET | Freq: Every day | ORAL | Status: DC

## 2011-12-14 NOTE — ED Provider Notes (Addendum)
Chief Complaint  Patient presents with  . Anal Itching    History of Present Illness:   Linda Thompson is a 56 year old diabetic female who has multiple areas of yeast infection. This is been going on for months. She has had an itchy rash under both breasts and the groin areas. Over the past 2 days she's also developed some anal itching. She denies any constipation, diarrhea, blood in the stool, or hematochezia. She is a diabetic and is on metformin. She's not checking her CBGs regularly. She does see Dr. Milinda Antis and she has also seen a dermatologist for this. She's been prescribed a power and 3 creams, one of which was ketoconazole, without much success. She had a colonoscopy last March which was negative. She also takes HCTZ, and Klor-Con for blood pressure and pravastatin for hyperlipidemia.  Review of Systems:  Other than noted above, the patient denies any of the following symptoms: Systemic:  No fever, chills, sweats, weight loss, or fatigue. ENT:  No nasal congestion, rhinorrhea, sore throat, swelling of lips, tongue or throat. Resp:  No cough, wheezing, or shortness of breath. Skin:  No rash, itching, nodules, or suspicious lesions.  PMFSH:  Past medical history, family history, social history, meds, and allergies were reviewed.  Physical Exam:   Vital signs:  BP 125/67  Pulse 96  Temp 98 F (36.7 C) (Oral)  Resp 18  SpO2 99% Gen:  Alert, oriented, in no distress. ENT:  Pharynx clear, no intraoral lesions, moist mucous membranes. Lungs:  Clear to auscultation. Skin:  She has a severe intertriginous rash under both breasts and in the groin area. This is erythematous but not weeping or oozing. There does not appear to be any secondary infection. Anal exam: There is no erythema or rash externally. Anoscopic exam was done to 3 cm which showed no intra-anal lesions or hemorrhoids or fissures. There were no polyps. Digital rectal exam reveals no tenderness or mass and heme-negative  stool.  Results for orders placed during the hospital encounter of 12/14/11  OCCULT BLOOD, POC DEVICE      Component Value Range   Fecal Occult Bld NEGATIVE    GLUCOSE, CAPILLARY      Component Value Range   Glucose-Capillary 119 (*) 70 - 99 mg/dL   Assessment:  The primary encounter diagnosis was Candidiasis of anus. Diagnoses of Candidiasis of breast and Candidiasis of genitalia in female were also pertinent to this visit.  Plan:   1.  The following meds were prescribed:   New Prescriptions   KETOCONAZOLE (NIZORAL) 200 MG TABLET    Take 1 tablet (200 mg total) by mouth daily.   2.  The patient was instructed in symptomatic care and handouts were given. 3.  The patient was told to return if becoming worse in any way, if no better in 3 or 4 days, and given some red flag symptoms that would indicate earlier return.     Reuben Likes, MD 12/14/11 1933  Reuben Likes, MD 12/14/11 [phone]

## 2011-12-14 NOTE — ED Notes (Signed)
Reports frequent/chronic yeast infections to groin area and under bilat breasts - has been referred to dermatologist.  Is currently using ketoconozole shampoo and topical cream, as well as "2-3 other creams and a powder".  Started with anal itching - started using unscented baby wipes.  States now she's starting to bleed from scratching area so much.  Large area of redness noted under left breast; slight redness under right breast as well.

## 2011-12-15 ENCOUNTER — Ambulatory Visit (INDEPENDENT_AMBULATORY_CARE_PROVIDER_SITE_OTHER): Payer: Medicare Other | Admitting: Family Medicine

## 2011-12-15 ENCOUNTER — Telehealth: Payer: Self-pay | Admitting: Family Medicine

## 2011-12-15 ENCOUNTER — Encounter: Payer: Self-pay | Admitting: Family Medicine

## 2011-12-15 VITALS — BP 142/84 | HR 90 | Temp 97.5°F | Ht 62.0 in | Wt 191.5 lb

## 2011-12-15 DIAGNOSIS — B372 Candidiasis of skin and nail: Secondary | ICD-10-CM

## 2011-12-15 NOTE — Progress Notes (Signed)
Subjective:    Patient ID: Linda Thompson, female    DOB: 09-15-1955, 56 y.o.   MRN: [id number]  HPI Here for yeast dermatitis Was seen in the ER for this over weekend- is a chronic intermittent problem  Has been on multiple creams and powders and oral meds  Was given nizoral 200 mg for 10 d course Took one yesterday  Continued to itch in the anal area   Used her ketoconazole cream that she already has   Rash is under breasts and inguinal area and anal area Was checked and told she does not have hemorrhoids  She does use baby wipes to clean after bowel movements   Patient Active Problem List  Diagnosis  . FUNGAL DERMATITIS  . MENINGIOMA  . DIABETES MELLITUS, TYPE II  . HYPERLIPIDEMIA  . MENTAL RETARDATION, MODERATE  . GLAUCOMA NOS  . HYPERTENSION  . ALLERGIC  RHINITIS  . GERD  . HIATAL HERNIA  . DERMATITIS, SEBORRHEIC NOS  . PRURITUS ANI  . OSTEOARTHRITIS, FOOT  . HEART MURMUR  . TRANSAMINASES, SERUM, ELEVATED  . Personal history of other musculoskeletal disorders  . IRRITABLE BOWEL SYNDROME  . Hypokalemia  . Back pain  . Candidal intertrigo  . Obesity  . Tinea pedis  . Insect bites  . Acute bronchitis, bacterial  . Viral sore throat  . Abdominal  pain, other specified site  . Diarrhea  . Chronic dermatitis of hands  . UTI (lower urinary tract infection)  . Cough  . ETD (eustachian tube dysfunction)   Past Medical History  Diagnosis Date  . Allergic rhinitis, cause unspecified   . Pain in joint, ankle and foot   . Backache, unspecified     chronic LBP with radiculopathy  . Seborrheic dermatitis, unspecified   . Type II or unspecified type diabetes mellitus without mention of complication, not stated as uncontrolled   . History of fracture of arm     Right  . History of fracture of foot   . Dermatomycosis, unspecified   . Esophageal reflux   . Unspecified glaucoma(365.9)   . Undiagnosed cardiac murmurs   . Diaphragmatic hernia without mention of  obstruction or gangrene   . Other and unspecified hyperlipidemia   . Unspecified essential hypertension   . Irritable bowel syndrome   . Benign neoplasm of cerebral meninges   . Moderate intellectual disabilities   . Nausea alone   . Osteoarthrosis, unspecified whether generalized or localized, ankle and foot   . Pruritus ani   . Unspecified tinnitus   . Nonspecific elevation of levels of transaminase or lactic acid dehydrogenase (LDH)   . Leukorrhea, not specified as infective   . Obesity   . Hyperglycemia   . DDD (degenerative disc disease), lumbar   . Yeast infection of the skin     Frequent   Past Surgical History  Procedure Date  . Total abdominal hysterectomy   . Cataract extraction   . Elbow surgery     right elbow   History  Substance Use Topics  . Smoking status: Never Smoker   . Smokeless tobacco: Never Used  . Alcohol Use: No   Family History  Problem Relation Age of Onset  . Diabetes Mother   . Diabetes Maternal Aunt   . Heart disease Maternal Aunt      (had pacemaker)  . Diabetes Maternal Grandmother   . Stroke Mother   . Stroke Father   . Stroke Maternal Grandmother    Allergies  Allergen Reactions  . Ace Inhibitors     REACTION: COUGH  . Amoxicillin-Pot Clavulanate     REACTION: diarrhea  . Amoxicillin-Pot Clavulanate Other (See Comments)    Pt unaware of reaction to augmentin  . Azithromycin Diarrhea  . Cyclobenzaprine Hcl     REACTION: diarrhea  . Flexeril (Cyclobenzaprine)   . Flonase (Fluticasone Propionate)     nosebleed  . Ibuprofen     REACTION: vomiting   Current Outpatient Prescriptions on File Prior to Visit  Medication Sig Dispense Refill  . ACCU-CHEK AVIVA PLUS test strip USE EVERY DAY TO CHECK GLUCOSE AND TO CHECK AS NEEDED  100 strip  2  . aspirin 81 MG tablet Take 81 mg by mouth daily.        Marland Kitchen diltiazem (CARDIZEM CD) 240 MG 24 hr capsule Take 1 capsule (240 mg total) by mouth daily.  90 capsule  3  . hydrochlorothiazide  (MICROZIDE) 12.5 MG capsule Take 1 capsule (12.5 mg total) by mouth daily.  90 capsule  3  . ketoconazole (NIZORAL) 2 % cream Apply topically as needed.      Marland Kitchen ketoconazole (NIZORAL) 2 % shampoo Apply 1 application topically 2 (two) times a week.      Marland Kitchen ketoconazole (NIZORAL) 2 % shampoo USE AS DIRECTED TWICE WEEKLY PRN  120 mL  3  . ketoconazole (NIZORAL) 200 MG tablet Take 1 tablet (200 mg total) by mouth daily.  10 tablet  0  . latanoprost (XALATAN) 0.005 % ophthalmic solution Place 1 drop into both eyes at bedtime.       Marland Kitchen loperamide (IMODIUM) 1 MG/5ML solution Take 1 mg by mouth 4 (four) times daily as needed.      . metFORMIN (GLUCOPHAGE) 500 MG tablet Take 1 tablet (500 mg total) by mouth 2 (two) times daily with a meal.  180 tablet  3  . omeprazole (PRILOSEC) 20 MG capsule Take 1 capsule (20 mg total) by mouth 2 (two) times daily.  180 capsule  3  . potassium chloride SA (K-DUR,KLOR-CON) 20 MEQ tablet Take 1 tablet (20 mEq total) by mouth daily.  90 tablet  3  . pravastatin (PRAVACHOL) 40 MG tablet Take 1 tablet (40 mg total) by mouth every evening.  90 tablet  3  . PROAIR HFA 108 (90 BASE) MCG/ACT inhaler Ad lib.      . promethazine (PHENERGAN) 25 MG tablet Take 25 mg by mouth every 6 (six) hours as needed.      Marland Kitchen Propylene Glycol (SYSTANE BALANCE OP) Apply to eye.      Marland Kitchen UNKNOWN TO PATIENT 2-3 other topical agents (creams and powder) for yeast infections      . [DISCONTINUED] Calcium Carbonate-Vitamin D (CALCIUM-VITAMIN D) 500-200 MG-UNIT per tablet Take 1 tablet by mouth 2 (two) times daily with a meal.        . [DISCONTINUED] fexofenadine (ALLEGRA) 180 MG tablet Take 1 tablet (180 mg total) by mouth daily.  15 tablet  0        Review of Systems Review of Systems  Constitutional: Negative for fever, appetite change, fatigue and unexpected weight change.  Eyes: Negative for pain and visual disturbance.  Respiratory: Negative for cough and shortness of breath.   Cardiovascular:  Negative for cp or palpitations    Gastrointestinal: Negative for nausea, diarrhea and constipation.  Genitourinary: Negative for urgency and frequency.  Skin: Negative for pallor and pos for itchy rash   Neurological: Negative for weakness, light-headedness, numbness and  headaches.  Hematological: Negative for adenopathy. Does not bruise/bleed easily.  Psychiatric/Behavioral: Negative for dysphoric mood. The patient is not nervous/anxious.         Objective:   Physical Exam  Constitutional: She appears well-developed and well-nourished. No distress.       obese and well appearing   Eyes: Conjunctivae normal and EOM are normal. Pupils are equal, round, and reactive to light.  Neck: Normal range of motion. Neck supple.  Cardiovascular: Normal rate and regular rhythm.   Pulmonary/Chest: Effort normal and breath sounds normal.  Lymphadenopathy:    She has no cervical adenopathy.  Neurological: She is alert.  Skin: Skin is warm and dry. Rash noted. There is erythema. No pallor.       Erythematous flat rash under breasts and in groin Sharply demarcated and with satellite lesions Few excoriations No skin break down  Psychiatric: She has a normal mood and affect.          Assessment & Plan:

## 2011-12-15 NOTE — Telephone Encounter (Signed)
Triage Call Report Triage Record Num: [id number] Operator: Craig Guess Patient Name: Linda Thompson Call Date & Time: 12/15/2011 2:26:32AM Patient Phone: [phone] PCP: Audrie Gallus. [facility] Patient Gender: Female PCP Fax : Patient DOB: 1955-02-21 Practice Name: [facility] Reason for Call: Caller: Devika/Patient; PCP: [facility]); CB#: [phone]; Call regarding Questions about UC visit on 12/14/11. Caller reports she saw MD at [facility] on Sunday for itching of her rectum. MD told her that she may have hemorrhoids and yeast in vaginal area and under her breast. She was given Rx for Ketoconazole to take for 10 days. Caller reports she has taken 1 pill so far and still has some itching. Caller asking if she should still have itching. Caller advised it will take more than one dose to begin resolve symptoms. Caller asking if she should see PCP. Caller advised she can be seen by MD if she would like, but she should take medications as directed. Notes reviewed in EPIC. Caller advised to continue to taking PO meds as she was given and advised ok to continue to use baby wipes after she used the bathroom as she has been doing. Caller advised to call for an appointment with PCP in the next few days if symptoms do not clear. She will do so. Protocol(s) Used: Information Only Call; No Symptom Triage (Adult) Recommended Outcome per Protocol: Provide Information or Advice Only Reason for Outcome: Follow-up call to recent contact; no triage required. Information provided from past call documentation, approved references or experience. Care Advice: ~ 12/15/2011 2:52:42AM Page 1 of 1 [email or web address]

## 2011-12-15 NOTE — Patient Instructions (Addendum)
For your yeast skin infection- finish your px for the nizoral (ketoconazole ) pills daily until done with them You can also use your cream if you think it helps as well Keep the rash areas very very dry-- in fact- after washing - you can dry the areas with a hair dryer on cool setting  Let me know if no further improvement - we may get you back to the dermatologist

## 2011-12-15 NOTE — Assessment & Plan Note (Signed)
Under breasts and in groin area (not vaginal) , also external rectal area  This is recurrent - likely due to obesity/ tendency to sweat / DM  Disc imp of keeping the areas very dry- see AVS Will finish 10 day course of nizoral oral 200 mg daily Also can use her cream to ease itch if necessary  Will update if worse or no imp - may consider derm visit

## 2011-12-17 ENCOUNTER — Telehealth: Payer: Self-pay | Admitting: Family Medicine

## 2011-12-17 ENCOUNTER — Ambulatory Visit (INDEPENDENT_AMBULATORY_CARE_PROVIDER_SITE_OTHER): Payer: Medicare Other | Admitting: Family Medicine

## 2011-12-17 ENCOUNTER — Encounter: Payer: Self-pay | Admitting: Family Medicine

## 2011-12-17 VITALS — BP 120/84 | HR 94 | Temp 98.2°F | Ht 62.0 in | Wt 190.0 lb

## 2011-12-17 DIAGNOSIS — T887XXA Unspecified adverse effect of drug or medicament, initial encounter: Secondary | ICD-10-CM

## 2011-12-17 DIAGNOSIS — B372 Candidiasis of skin and nail: Secondary | ICD-10-CM

## 2011-12-17 DIAGNOSIS — T50905A Adverse effect of unspecified drugs, medicaments and biological substances, initial encounter: Secondary | ICD-10-CM

## 2011-12-17 MED ORDER — FLUCONAZOLE 150 MG PO TABS: ORAL_TABLET | ORAL | Status: DC

## 2011-12-17 NOTE — Progress Notes (Signed)
Subjective:    Patient ID: Linda Thompson, female    DOB: Oct 31, 1955, 56 y.o.   MRN: [id number]  HPI Here for potential side effect from medication  Pt is on a 10 day course of oral ketoconazole 200 for 10 days - for candidal intertrigo (also using topicals)  Has had a total of 3 doses Thinks it is making her feel weak and like she has no appetite  Is eating a little bit -but not much at all , and cannot make herself eat Is worried about her fatigue and loss of energy  No new diarrhea- more than usual  No n/v  Her rash - candidal looks like it is getting better   Patient Active Problem List  Diagnosis  . FUNGAL DERMATITIS  . MENINGIOMA  . DIABETES MELLITUS, TYPE II  . HYPERLIPIDEMIA  . MENTAL RETARDATION, MODERATE  . GLAUCOMA NOS  . HYPERTENSION  . ALLERGIC  RHINITIS  . GERD  . HIATAL HERNIA  . DERMATITIS, SEBORRHEIC NOS  . PRURITUS ANI  . OSTEOARTHRITIS, FOOT  . HEART MURMUR  . TRANSAMINASES, SERUM, ELEVATED  . Personal history of other musculoskeletal disorders  . IRRITABLE BOWEL SYNDROME  . Hypokalemia  . Back pain  . Candidal intertrigo  . Obesity  . Tinea pedis  . Insect bites  . Acute bronchitis, bacterial  . Viral sore throat  . Abdominal  pain, other specified site  . Diarrhea  . Chronic dermatitis of hands  . UTI (lower urinary tract infection)  . Cough  . ETD (eustachian tube dysfunction)   Past Medical History  Diagnosis Date  . Allergic rhinitis, cause unspecified   . Pain in joint, ankle and foot   . Backache, unspecified     chronic LBP with radiculopathy  . Seborrheic dermatitis, unspecified   . Type II or unspecified type diabetes mellitus without mention of complication, not stated as uncontrolled   . History of fracture of arm     Right  . History of fracture of foot   . Dermatomycosis, unspecified   . Esophageal reflux   . Unspecified glaucoma(365.9)   . Undiagnosed cardiac murmurs   . Diaphragmatic hernia without mention of  obstruction or gangrene   . Other and unspecified hyperlipidemia   . Unspecified essential hypertension   . Irritable bowel syndrome   . Benign neoplasm of cerebral meninges   . Moderate intellectual disabilities   . Nausea alone   . Osteoarthrosis, unspecified whether generalized or localized, ankle and foot   . Pruritus ani   . Unspecified tinnitus   . Nonspecific elevation of levels of transaminase or lactic acid dehydrogenase (LDH)   . Leukorrhea, not specified as infective   . Obesity   . Hyperglycemia   . DDD (degenerative disc disease), lumbar   . Yeast infection of the skin     Frequent   Past Surgical History  Procedure Date  . Total abdominal hysterectomy   . Cataract extraction   . Elbow surgery     right elbow   History  Substance Use Topics  . Smoking status: Never Smoker   . Smokeless tobacco: Never Used  . Alcohol Use: No   Family History  Problem Relation Age of Onset  . Diabetes Mother   . Diabetes Maternal Aunt   . Heart disease Maternal Aunt      (had pacemaker)  . Diabetes Maternal Grandmother   . Stroke Mother   . Stroke Father   . Stroke Maternal Grandmother  Allergies  Allergen Reactions  . Ace Inhibitors     REACTION: COUGH  . Amoxicillin-Pot Clavulanate     REACTION: diarrhea  . Amoxicillin-Pot Clavulanate Other (See Comments)    Pt unaware of reaction to augmentin  . Azithromycin Diarrhea  . Cyclobenzaprine Hcl     REACTION: diarrhea  . Flexeril (Cyclobenzaprine)   . Flonase (Fluticasone Propionate)     nosebleed  . Ibuprofen     REACTION: vomiting   Current Outpatient Prescriptions on File Prior to Visit  Medication Sig Dispense Refill  . ACCU-CHEK AVIVA PLUS test strip USE EVERY DAY TO CHECK GLUCOSE AND TO CHECK AS NEEDED  100 strip  2  . aspirin 81 MG tablet Take 81 mg by mouth daily.        Marland Kitchen diltiazem (CARDIZEM CD) 240 MG 24 hr capsule Take 1 capsule (240 mg total) by mouth daily.  90 capsule  3  . hydrochlorothiazide  (MICROZIDE) 12.5 MG capsule Take 1 capsule (12.5 mg total) by mouth daily.  90 capsule  3  . hydrocortisone 2.5 % cream Apply topically 2 (two) times daily as needed.      Marland Kitchen ketoconazole (NIZORAL) 2 % cream Apply topically as needed.      Marland Kitchen ketoconazole (NIZORAL) 2 % shampoo Apply 1 application topically 2 (two) times a week.      Marland Kitchen ketoconazole (NIZORAL) 2 % shampoo USE AS DIRECTED TWICE WEEKLY PRN  120 mL  3  . ketoconazole (NIZORAL) 200 MG tablet Take 1 tablet (200 mg total) by mouth daily.  10 tablet  0  . latanoprost (XALATAN) 0.005 % ophthalmic solution Place 1 drop into both eyes at bedtime.       Marland Kitchen loperamide (IMODIUM) 1 MG/5ML solution Take 1 mg by mouth 4 (four) times daily as needed.      . metFORMIN (GLUCOPHAGE) 500 MG tablet Take 1 tablet (500 mg total) by mouth 2 (two) times daily with a meal.  180 tablet  3  . omeprazole (PRILOSEC) 20 MG capsule Take 1 capsule (20 mg total) by mouth 2 (two) times daily.  180 capsule  3  . potassium chloride SA (K-DUR,KLOR-CON) 20 MEQ tablet Take 1 tablet (20 mEq total) by mouth daily.  90 tablet  3  . pravastatin (PRAVACHOL) 40 MG tablet Take 1 tablet (40 mg total) by mouth every evening.  90 tablet  3  . PROAIR HFA 108 (90 BASE) MCG/ACT inhaler Ad lib.      . promethazine (PHENERGAN) 25 MG tablet Take 25 mg by mouth every 6 (six) hours as needed.      Marland Kitchen Propylene Glycol (SYSTANE BALANCE OP) Apply to eye.      Marland Kitchen UNKNOWN TO PATIENT 2-3 other topical agents (creams and powder) for yeast infections      . [DISCONTINUED] Calcium Carbonate-Vitamin D (CALCIUM-VITAMIN D) 500-200 MG-UNIT per tablet Take 1 tablet by mouth 2 (two) times daily with a meal.        . [DISCONTINUED] fexofenadine (ALLEGRA) 180 MG tablet Take 1 tablet (180 mg total) by mouth daily.  15 tablet  0      Review of Systems Review of Systems  Constitutional: Negative for fever, and unexpected weight change. pos for appetite change and fatigue Eyes: Negative for pain and visual  disturbance.  Respiratory: Negative for cough and shortness of breath.   Cardiovascular: Negative for cp or palpitations    Gastrointestinal: Negative for nausea, diarrhea and constipation.  Genitourinary: Negative for urgency and  frequency.  Skin: Negative for pallor and pos for rash   Neurological: Negative for weakness, light-headedness, numbness and headaches.  Hematological: Negative for adenopathy. Does not bruise/bleed easily.  Psychiatric/Behavioral: Negative for dysphoric mood. The patient is not nervous/anxious.     .    Objective:   Physical Exam  Constitutional: She appears well-developed and well-nourished. No distress.       obese and well appearing   HENT:  Head: Normocephalic and atraumatic.  Eyes: Conjunctivae normal and EOM are normal. Pupils are equal, round, and reactive to light.  Musculoskeletal: She exhibits no edema.  Neurological: She is alert. She has normal reflexes.  Skin: Skin is warm and dry. Rash noted. There is erythema. No pallor.       Erythema under breasts is much improved Erythema in groin is improved - but not resolved- still some areas of bright erythema with satellite lesions- smaller than previous No open skin or s/s of bacterial infection   Psychiatric: She has a normal mood and affect.       Baseline MR          Assessment & Plan:

## 2011-12-17 NOTE — Telephone Encounter (Signed)
Please call and ask her if she is eating anything at all -- find out what she ate yesterday , if she can make herself eat a little , she should not get weak  Thanks

## 2011-12-17 NOTE — Assessment & Plan Note (Signed)
Intolerant of ketoconazole oral pills  Will change to diflucan - which she has done well with in the past- hope this will work as well for her candidal intertrigo

## 2011-12-17 NOTE — Telephone Encounter (Signed)
[facility] Triage Call Report Triage Record Num: [id number] Operator: Gypsy Decant Patient Name: Linda Thompson Call Date & Time: 12/17/2011 1:41:44AM Patient Phone: [phone] PCP: Audrie Gallus. Tower Patient Gender: Female PCP Fax : Patient DOB: 12-Aug-1955 Practice Name: [facility] Reason for Call: Caller: [name]/Patient; PCP: Roxy Manns [facility]); CB#: [phone]; Call regarding Seen in the office on 12/15/2011. ; She was given some "pills and cream" for rectal itching. She is concerned that she does not have an appetite and she is starting to feel week. She is taking 200mg  of Ketoconazole daily. She said that she is eating ok but she does not have an appetite. She is able to walk to the bathroom. Denies numbness or tingling. She wants appointment to be seen tomm for evaluation. Advised that she needs to call in AM as this RN is unable to access appt schedule in Epic. Pt denies any other s/sx. Protocol(s) Used: Office Note Recommended Outcome per Protocol: Information Noted and Sent to Office Reason for Outcome: Caller information to office Care Advice: ~ 12/17/2011 2:04:54AM Page 1 of 1 [email or web address]

## 2011-12-17 NOTE — Assessment & Plan Note (Signed)
This is improved with the oral ketoconazole, but pt is having side effects including lack of appetite and generalized weakness  (no diarrhea or vomiting) Will change this do diflucan 150 (3 doses in 6 days)- she has tolerated this med well in past  She also has ketoconazole/ desonide creams and nystop powder- which she will continue to use Will update if no improvement - will ref to derm  No s/s of cellulitis today Again emph imp of keeping areas very clean and dry

## 2011-12-17 NOTE — Telephone Encounter (Signed)
Pt has made appt to see you today

## 2011-12-17 NOTE — Patient Instructions (Addendum)
Since the nizoral pills are giving you side effects -stop them  Instead - take diflucan 150 mg one pill today and then one pill on Friday and then one pill on Sunday  If you have side effects let me know If your yeast rash does not improve further - update me and we will get you back to the dermatologist Do not scratch your rash  Keep it very dry

## 2011-12-23 DIAGNOSIS — H02059 Trichiasis without entropian unspecified eye, unspecified eyelid: Secondary | ICD-10-CM | POA: Diagnosis not present

## 2011-12-26 ENCOUNTER — Ambulatory Visit (INDEPENDENT_AMBULATORY_CARE_PROVIDER_SITE_OTHER): Payer: Medicare Other | Admitting: Family Medicine

## 2011-12-26 ENCOUNTER — Encounter: Payer: Self-pay | Admitting: Family Medicine

## 2011-12-26 VITALS — BP 142/88 | HR 92 | Temp 98.0°F | Ht 62.0 in | Wt 192.8 lb

## 2011-12-26 DIAGNOSIS — B372 Candidiasis of skin and nail: Secondary | ICD-10-CM | POA: Diagnosis not present

## 2011-12-26 DIAGNOSIS — J069 Acute upper respiratory infection, unspecified: Secondary | ICD-10-CM | POA: Diagnosis not present

## 2011-12-26 NOTE — Patient Instructions (Addendum)
I'm glad your rash is getting better  Continue your cream I think you have a head cold- this may go to your chest also  mucinex is ok to take along with your nasal saline spray Get rest and fluids Your symptoms may get worse before you get better Update if not starting to improve in a week or if worsening  - especially if headache/ facial pain or fever

## 2011-12-26 NOTE — Progress Notes (Signed)
Subjective:    Patient ID: Linda Thompson, female    DOB: [birth year], 56 y.o.   MRN: [id number]  HPI Here for uri symptoms Started with sneezing and congestion  Has been using some nasal saline spray  Nasal discharge -is clear No facial pain No fever  Does not feel too bad   Some post nasal drip-has to clear her throat  No ear pain   No cough at all yet  Patient Active Problem List  Diagnosis  . FUNGAL DERMATITIS  . MENINGIOMA  . DIABETES MELLITUS, TYPE II  . HYPERLIPIDEMIA  . MENTAL RETARDATION, MODERATE  . GLAUCOMA NOS  . HYPERTENSION  . ALLERGIC  RHINITIS  . GERD  . HIATAL HERNIA  . DERMATITIS, SEBORRHEIC NOS  . PRURITUS ANI  . OSTEOARTHRITIS, FOOT  . HEART MURMUR  . TRANSAMINASES, SERUM, ELEVATED  . Personal history of other musculoskeletal disorders  . IRRITABLE BOWEL SYNDROME  . Hypokalemia  . Back pain  . Candidal intertrigo  . Obesity  . Tinea pedis  . Chronic dermatitis of hands  . Adverse effects of medication   Past Medical History  Diagnosis Date  . Allergic rhinitis, cause unspecified   . Pain in joint, ankle and foot   . Backache, unspecified     chronic LBP with radiculopathy  . Seborrheic dermatitis, unspecified   . Type II or unspecified type diabetes mellitus without mention of complication, not stated as uncontrolled   . History of fracture of arm     Right  . History of fracture of foot   . Dermatomycosis, unspecified   . Esophageal reflux   . Unspecified glaucoma(365.9)   . Undiagnosed cardiac murmurs   . Diaphragmatic hernia without mention of obstruction or gangrene   . Other and unspecified hyperlipidemia   . Unspecified essential hypertension   . Irritable bowel syndrome   . Benign neoplasm of cerebral meninges   . Moderate intellectual disabilities   . Nausea alone   . Osteoarthrosis, unspecified whether generalized or localized, ankle and foot   . Pruritus ani   . Unspecified tinnitus   . Nonspecific elevation of levels  of transaminase or lactic acid dehydrogenase (LDH)   . Leukorrhea, not specified as infective   . Obesity   . Hyperglycemia   . DDD (degenerative disc disease), lumbar   . Yeast infection of the skin     Frequent   Past Surgical History  Procedure Date  . Total abdominal hysterectomy   . Cataract extraction   . Elbow surgery     right elbow   History  Substance Use Topics  . Smoking status: Never Smoker   . Smokeless tobacco: Never Used  . Alcohol Use: No   Family History  Problem Relation Age of Onset  . Diabetes Mother   . Diabetes Maternal Aunt   . Heart disease Maternal Aunt      (had pacemaker)  . Diabetes Maternal Grandmother   . Stroke Mother   . Stroke Father   . Stroke Maternal Grandmother    Allergies  Allergen Reactions  . Ace Inhibitors     REACTION: COUGH  . Amoxicillin-Pot Clavulanate     REACTION: diarrhea  . Amoxicillin-Pot Clavulanate Other (See Comments)    Pt unaware of reaction to augmentin  . Azithromycin Diarrhea  . Cyclobenzaprine Hcl     REACTION: diarrhea  . Flexeril (Cyclobenzaprine)   . Flonase (Fluticasone Propionate)     nosebleed  . Ibuprofen  REACTION: vomiting   Current Outpatient Prescriptions on File Prior to Visit  Medication Sig Dispense Refill  . ACCU-CHEK AVIVA PLUS test strip USE EVERY DAY TO CHECK GLUCOSE AND TO CHECK AS NEEDED  100 strip  2  . aspirin 81 MG tablet Take 81 mg by mouth daily.        Marland Kitchen desonide (DESOWEN) 0.05 % cream as needed.      . diltiazem (CARDIZEM CD) 240 MG 24 hr capsule Take 1 capsule (240 mg total) by mouth daily.  90 capsule  3  . fluconazole (DIFLUCAN) 150 MG tablet Take one pill today by mouth and then one pill on Friday and then one pill on Sunday  3 tablet  0  . hydrochlorothiazide (MICROZIDE) 12.5 MG capsule Take 1 capsule (12.5 mg total) by mouth daily.  90 capsule  3  . hydrocortisone 2.5 % cream Apply topically 2 (two) times daily as needed.      Marland Kitchen ketoconazole (NIZORAL) 2 % cream  Apply topically as needed.      Marland Kitchen ketoconazole (NIZORAL) 2 % shampoo Apply 1 application topically 2 (two) times a week.      Marland Kitchen ketoconazole (NIZORAL) 2 % shampoo USE AS DIRECTED TWICE WEEKLY PRN  120 mL  3  . latanoprost (XALATAN) 0.005 % ophthalmic solution Place 1 drop into both eyes at bedtime.       Marland Kitchen loperamide (IMODIUM) 1 MG/5ML solution Take 1 mg by mouth 4 (four) times daily as needed.      . metFORMIN (GLUCOPHAGE) 500 MG tablet Take 1 tablet (500 mg total) by mouth 2 (two) times daily with a meal.  180 tablet  3  . omeprazole (PRILOSEC) 20 MG capsule Take 1 capsule (20 mg total) by mouth 2 (two) times daily.  180 capsule  3  . potassium chloride SA (K-DUR,KLOR-CON) 20 MEQ tablet Take 1 tablet (20 mEq total) by mouth daily.  90 tablet  3  . pravastatin (PRAVACHOL) 40 MG tablet Take 1 tablet (40 mg total) by mouth every evening.  90 tablet  3  . PROAIR HFA 108 (90 BASE) MCG/ACT inhaler Ad lib.      . promethazine (PHENERGAN) 25 MG tablet Take 25 mg by mouth every 6 (six) hours as needed.      Marland Kitchen Propylene Glycol (SYSTANE BALANCE OP) Apply to eye.      Marland Kitchen UNKNOWN TO PATIENT 2-3 other topical agents (creams and powder) for yeast infections      . [DISCONTINUED] Calcium Carbonate-Vitamin D (CALCIUM-VITAMIN D) 500-200 MG-UNIT per tablet Take 1 tablet by mouth 2 (two) times daily with a meal.        . [DISCONTINUED] fexofenadine (ALLEGRA) 180 MG tablet Take 1 tablet (180 mg total) by mouth daily.  15 tablet  0      Review of Systems Review of Systems  Constitutional: Negative for fever, appetite change, fatigue and unexpected weight change.  Eyes: Negative for pain and visual disturbance.  ENT pos for cong and post nasal drip , neg for sinus pain  Respiratory: Negative for cough and shortness of breath.   Cardiovascular: Negative for cp or palpitations    Gastrointestinal: Negative for nausea, diarrhea and constipation.  Genitourinary: Negative for urgency and frequency.  Skin: Negative  for pallor or rash   Neurological: Negative for weakness, light-headedness, numbness and headaches.  Hematological: Negative for adenopathy. Does not bruise/bleed easily.  Psychiatric/Behavioral: Negative for dysphoric mood. The patient is not nervous/anxious.  Objective:   Physical Exam  Constitutional: She appears well-developed and well-nourished. No distress.  HENT:  Head: Normocephalic and atraumatic.  Right Ear: External ear normal.  Left Ear: External ear normal.  Mouth/Throat: Oropharynx is clear and moist. No oropharyngeal exudate.       Nares are injected and congested  No sinus tenderness Clear post nasal drip   Eyes: Conjunctivae normal and EOM are normal. Pupils are equal, round, and reactive to light. Right eye exhibits no discharge. Left eye exhibits no discharge.  Neck: Normal range of motion. Neck supple. No JVD present. No thyromegaly present.  Cardiovascular: Normal rate and regular rhythm.   Pulmonary/Chest: Effort normal and breath sounds normal. No respiratory distress. She has no wheezes. She has no rales.  Musculoskeletal: She exhibits no edema.  Lymphadenopathy:    She has no cervical adenopathy.  Neurological: She is alert. She has normal reflexes.  Skin: Skin is warm and dry. There is erythema. No pallor.       Much improved rash under breasts and in groin Small area of redness under L arm   Psychiatric: She has a normal mood and affect.          Assessment & Plan:

## 2011-12-26 NOTE — Assessment & Plan Note (Signed)
Mild nasal symptoms and reassuring exam Disc symptomatic care - see instructions on AVS Given handout -easy to read  Update if not starting to improve in a week or if worsening

## 2011-12-26 NOTE — Assessment & Plan Note (Signed)
Much imp after the diflucan One residual spot under L arm- will continue using antifungal cream and update

## 2011-12-31 ENCOUNTER — Ambulatory Visit (INDEPENDENT_AMBULATORY_CARE_PROVIDER_SITE_OTHER): Payer: Medicare Other | Admitting: Family Medicine

## 2011-12-31 ENCOUNTER — Encounter: Payer: Self-pay | Admitting: Family Medicine

## 2011-12-31 VITALS — BP 136/84 | HR 88 | Temp 97.5°F | Ht 62.0 in | Wt 192.2 lb

## 2011-12-31 DIAGNOSIS — B369 Superficial mycosis, unspecified: Secondary | ICD-10-CM

## 2011-12-31 NOTE — Assessment & Plan Note (Signed)
Ongoing and recurrent  Last bout cleared with diflucan course  Now using ketoconazole Pt is diabetic and also sweats a lot and has mental retardation Had a long discussion about hygiene today  Disc plan of daily showers with thorough drying and use of ketoconazole cream  Also plan f/u with dermatology Will update if worse or not imp

## 2011-12-31 NOTE — Progress Notes (Signed)
Subjective:    Patient ID: Linda Thompson, female    DOB: 28-Jun-1955, 56 y.o.   MRN: [id number]  HPI Is here again with rectal itching and redness/ rash under breasts  Has chronic yeast skin infection   Does use baby wipes after bm - unscented  Then dries well  Still itches   Is diabetic Lab Results  Component Value Date   HGBA1C 6.4 11/07/2011    Is using the ketoconazole cream and powder Got better after course of diflucan -then it came back   Takes shower 1-2 times per week   Has seen derm in past- that was helpful  Patient Active Problem List  Diagnosis  . FUNGAL DERMATITIS  . MENINGIOMA  . DIABETES MELLITUS, TYPE II  . HYPERLIPIDEMIA  . MENTAL RETARDATION, MODERATE  . GLAUCOMA NOS  . HYPERTENSION  . ALLERGIC  RHINITIS  . GERD  . HIATAL HERNIA  . DERMATITIS, SEBORRHEIC NOS  . PRURITUS ANI  . OSTEOARTHRITIS, FOOT  . HEART MURMUR  . TRANSAMINASES, SERUM, ELEVATED  . Personal history of other musculoskeletal disorders  . IRRITABLE BOWEL SYNDROME  . Hypokalemia  . Back pain  . Candidal intertrigo  . Obesity  . Tinea pedis  . Chronic dermatitis of hands  . Adverse effects of medication  . Viral URI   Past Medical History  Diagnosis Date  . Allergic rhinitis, cause unspecified   . Pain in joint, ankle and foot   . Backache, unspecified     chronic LBP with radiculopathy  . Seborrheic dermatitis, unspecified   . Type II or unspecified type diabetes mellitus without mention of complication, not stated as uncontrolled   . History of fracture of arm     Right  . History of fracture of foot   . Dermatomycosis, unspecified   . Esophageal reflux   . Unspecified glaucoma(365.9)   . Undiagnosed cardiac murmurs   . Diaphragmatic hernia without mention of obstruction or gangrene   . Other and unspecified hyperlipidemia   . Unspecified essential hypertension   . Irritable bowel syndrome   . Benign neoplasm of cerebral meninges   . Moderate intellectual  disabilities   . Nausea alone   . Osteoarthrosis, unspecified whether generalized or localized, ankle and foot   . Pruritus ani   . Unspecified tinnitus   . Nonspecific elevation of levels of transaminase or lactic acid dehydrogenase (LDH)   . Leukorrhea, not specified as infective   . Obesity   . Hyperglycemia   . DDD (degenerative disc disease), lumbar   . Yeast infection of the skin     Frequent   Past Surgical History  Procedure Date  . Total abdominal hysterectomy   . Cataract extraction   . Elbow surgery     right elbow   History  Substance Use Topics  . Smoking status: Never Smoker   . Smokeless tobacco: Never Used  . Alcohol Use: No   Family History  Problem Relation Age of Onset  . Diabetes Mother   . Diabetes Maternal Aunt   . Heart disease Maternal Aunt      (had pacemaker)  . Diabetes Maternal Grandmother   . Stroke Mother   . Stroke Father   . Stroke Maternal Grandmother    Allergies  Allergen Reactions  . Ace Inhibitors     REACTION: COUGH  . Amoxicillin-Pot Clavulanate     REACTION: diarrhea  . Amoxicillin-Pot Clavulanate Other (See Comments)    Pt unaware of reaction to  augmentin  . Azithromycin Diarrhea  . Cyclobenzaprine Hcl     REACTION: diarrhea  . Flexeril (Cyclobenzaprine)   . Flonase (Fluticasone Propionate)     nosebleed  . Ibuprofen     REACTION: vomiting   Current Outpatient Prescriptions on File Prior to Visit  Medication Sig Dispense Refill  . ACCU-CHEK AVIVA PLUS test strip USE EVERY DAY TO CHECK GLUCOSE AND TO CHECK AS NEEDED  100 strip  2  . aspirin 81 MG tablet Take 81 mg by mouth daily.        Marland Kitchen desonide (DESOWEN) 0.05 % cream as needed.      . diltiazem (CARDIZEM CD) 240 MG 24 hr capsule Take 1 capsule (240 mg total) by mouth daily.  90 capsule  3  . hydrochlorothiazide (MICROZIDE) 12.5 MG capsule Take 1 capsule (12.5 mg total) by mouth daily.  90 capsule  3  . hydrocortisone 2.5 % cream Apply topically 2 (two) times daily  as needed.      Marland Kitchen ketoconazole (NIZORAL) 2 % cream Apply topically as needed.      Marland Kitchen ketoconazole (NIZORAL) 2 % shampoo Apply 1 application topically 2 (two) times a week.      Marland Kitchen ketoconazole (NIZORAL) 2 % shampoo USE AS DIRECTED TWICE WEEKLY PRN  120 mL  3  . latanoprost (XALATAN) 0.005 % ophthalmic solution Place 1 drop into both eyes at bedtime.       Marland Kitchen loperamide (IMODIUM) 1 MG/5ML solution Take 1 mg by mouth 4 (four) times daily as needed.      . metFORMIN (GLUCOPHAGE) 500 MG tablet Take 1 tablet (500 mg total) by mouth 2 (two) times daily with a meal.  180 tablet  3  . omeprazole (PRILOSEC) 20 MG capsule Take 1 capsule (20 mg total) by mouth 2 (two) times daily.  180 capsule  3  . potassium chloride SA (K-DUR,KLOR-CON) 20 MEQ tablet Take 1 tablet (20 mEq total) by mouth daily.  90 tablet  3  . pravastatin (PRAVACHOL) 40 MG tablet Take 1 tablet (40 mg total) by mouth every evening.  90 tablet  3  . PROAIR HFA 108 (90 BASE) MCG/ACT inhaler Ad lib.      . promethazine (PHENERGAN) 25 MG tablet Take 25 mg by mouth every 6 (six) hours as needed.      Marland Kitchen Propylene Glycol (SYSTANE BALANCE OP) Apply to eye.      Marland Kitchen UNKNOWN TO PATIENT 2-3 other topical agents (creams and powder) for yeast infections      . [DISCONTINUED] Calcium Carbonate-Vitamin D (CALCIUM-VITAMIN D) 500-200 MG-UNIT per tablet Take 1 tablet by mouth 2 (two) times daily with a meal.        . [DISCONTINUED] fexofenadine (ALLEGRA) 180 MG tablet Take 1 tablet (180 mg total) by mouth daily.  15 tablet  0      Review of Systems Review of Systems  Constitutional: Negative for fever, appetite change, fatigue and unexpected weight change.  Eyes: Negative for pain and visual disturbance.  Respiratory: Negative for cough and shortness of breath.   Cardiovascular: Negative for cp or palpitations    Gastrointestinal: Negative for nausea, diarrhea and constipation.  Genitourinary: Negative for urgency and frequency.  Skin: Negative for  pallor and pos for itchy rash Neurological: Negative for weakness, light-headedness, numbness and headaches.  Hematological: Negative for adenopathy. Does not bruise/bleed easily.  Psychiatric/Behavioral: Negative for dysphoric mood. The patient is not nervous/anxious.         Objective:  Physical Exam  Constitutional: She appears well-developed and well-nourished. No distress.       obese and well appearing   HENT:  Head: Normocephalic and atraumatic.  Mouth/Throat: Oropharynx is clear and moist.  Eyes: Conjunctivae normal and EOM are normal. Pupils are equal, round, and reactive to light. Right eye exhibits no discharge. Left eye exhibits no discharge.  Neck: Normal range of motion. Neck supple.  Cardiovascular: Normal rate and regular rhythm.   Abdominal: Soft. Bowel sounds are normal.  Lymphadenopathy:    She has no cervical adenopathy.  Neurological: She is alert.  Skin: Skin is warm and dry. Rash noted. There is erythema. No pallor.       Erythema under L breast with some central clearing and some satellite lesions, is slt raised, area under R breast is much improved No rash under arms  slt erythema around rectal area and under pannus No vaginal rash          Assessment & Plan:

## 2011-12-31 NOTE — Patient Instructions (Addendum)
For your recurrent yeast dermatitis (skin rash) Start taking a shower every day and cleanse all areas well  Then dry all areas very very well -- and use a hair dryer on cool to make sure all areas are very dry -- under arms/ under breasts / in groin area and in rectal area  Then use your ketoconazole cream in all affected areas Try to stick to your diabetic diet  If you sweat- dry all areas frequently  We will do dermatology referral at check out

## 2012-01-02 DIAGNOSIS — E1149 Type 2 diabetes mellitus with other diabetic neurological complication: Secondary | ICD-10-CM | POA: Diagnosis not present

## 2012-01-02 DIAGNOSIS — L608 Other nail disorders: Secondary | ICD-10-CM | POA: Diagnosis not present

## 2012-01-07 ENCOUNTER — Telehealth: Payer: Self-pay | Admitting: Family Medicine

## 2012-01-07 ENCOUNTER — Ambulatory Visit: Payer: Medicare Other | Admitting: Gastroenterology

## 2012-01-07 ENCOUNTER — Ambulatory Visit (INDEPENDENT_AMBULATORY_CARE_PROVIDER_SITE_OTHER): Payer: Medicare Other | Admitting: Family Medicine

## 2012-01-07 ENCOUNTER — Encounter: Payer: Self-pay | Admitting: Family Medicine

## 2012-01-07 VITALS — BP 124/92 | HR 88 | Temp 97.8°F | Ht 62.0 in | Wt 192.0 lb

## 2012-01-07 DIAGNOSIS — J069 Acute upper respiratory infection, unspecified: Secondary | ICD-10-CM | POA: Diagnosis not present

## 2012-01-07 NOTE — Assessment & Plan Note (Signed)
With persistent cough that is sometimes productive No other symptoms - and reassuring exam  Urged to drink fluids and continue mucinex DM for now  If worse or not imp in 3-4 d would consider abx coverage (esp if fever) Will update fri or monday

## 2012-01-07 NOTE — Telephone Encounter (Signed)
[facility] Triage Call Report Triage Record Num: [id number] Operator: Albertine Grates Patient Name: Linda Thompson Call Date & Time: 01/06/2012 7:54:44PM Patient Phone: [phone] PCP: Audrie Gallus. [place] Patient Gender: Female PCP Fax : Patient DOB: 02/05/1955 Practice Name: [facility] Reason for Call: Caller: [place]/Patient; PCP: Roxy Manns [facility]); CB#: [phone]; States was seen in office 12-6 and MD advised had " head cold". States doctor advised that cold may be getting :"into chest". Did not prescribe any medicine. Has been taking Mucinex for cough since 12-17. Cough is productive.Afebrile. Advised appointment within 2-3 days per Cough protocol due to cough not improving with recommended treatment. Will call office 12-18 or 19 to schedule. Protocol(s) Used: Cough - Adult Recommended Outcome per Protocol: See Provider within 72 Hours Reason for Outcome: Evaluated by provider AND no improvement in symptoms after following treatment plan for the time specified by provider Care Advice: ~ Continue to follow treatment plan, including medications, until evaluated by provider. Most adults need to drink 6-10 eight-ounce glasses (1.2-2.0 liters) of fluids per day unless previously told to limit fluid intake for other medical reasons. Limit fluids that contain caffeine, sugar or alcohol. Urine will be a very light yellow color when you drink enough fluids. ~ 01/06/2012 8:13:55PM Page 1 of 1 [id number]

## 2012-01-07 NOTE — Patient Instructions (Addendum)
You have a persistent cough after cold- this can last quite a while If it worsens or you have fever- please call Continue mucinex DM as directed to help symptoms and drink lots of fluids  Call to update [place] with how you are feeling on Friday or Monday

## 2012-01-07 NOTE — Progress Notes (Signed)
Subjective:    Patient ID: MAGHEN GROUP, female    DOB: February 19, 1955, 56 y.o.   MRN: [id number]  HPI Has had a cold since about Dec 7th  Has been taking mucinex DM --this does help Chest is rattly and she is coughing and spitting up some mucous  Chest is sore from coughing  No wheeze  Drinking lots of fluids   No st or ear pain Some post nasal drip  No fever or aches   Patient Active Problem List  Diagnosis  . FUNGAL DERMATITIS  . MENINGIOMA  . DIABETES MELLITUS, TYPE II  . HYPERLIPIDEMIA  . MENTAL RETARDATION, MODERATE  . GLAUCOMA NOS  . HYPERTENSION  . ALLERGIC  RHINITIS  . GERD  . HIATAL HERNIA  . DERMATITIS, SEBORRHEIC NOS  . PRURITUS ANI  . OSTEOARTHRITIS, FOOT  . HEART MURMUR  . TRANSAMINASES, SERUM, ELEVATED  . Personal history of other musculoskeletal disorders  . IRRITABLE BOWEL SYNDROME  . Hypokalemia  . Back pain  . Candidal intertrigo  . Obesity  . Tinea pedis  . Chronic dermatitis of hands  . Adverse effects of medication  . Viral URI   Past Medical History  Diagnosis Date  . Allergic rhinitis, cause unspecified   . Pain in joint, ankle and foot   . Backache, unspecified     chronic LBP with radiculopathy  . Seborrheic dermatitis, unspecified   . Type II or unspecified type diabetes mellitus without mention of complication, not stated as uncontrolled   . History of fracture of arm     Right  . History of fracture of foot   . Dermatomycosis, unspecified   . Esophageal reflux   . Unspecified glaucoma(365.9)   . Undiagnosed cardiac murmurs   . Diaphragmatic hernia without mention of obstruction or gangrene   . Other and unspecified hyperlipidemia   . Unspecified essential hypertension   . Irritable bowel syndrome   . Benign neoplasm of cerebral meninges   . Moderate intellectual disabilities   . Nausea alone   . Osteoarthrosis, unspecified whether generalized or localized, ankle and foot   . Pruritus ani   . Unspecified tinnitus   .  Nonspecific elevation of levels of transaminase or lactic acid dehydrogenase (LDH)   . Leukorrhea, not specified as infective   . Obesity   . Hyperglycemia   . DDD (degenerative disc disease), lumbar   . Yeast infection of the skin     Frequent   Past Surgical History  Procedure Date  . Total abdominal hysterectomy   . Cataract extraction   . Elbow surgery     right elbow   History  Substance Use Topics  . Smoking status: Never Smoker   . Smokeless tobacco: Never Used  . Alcohol Use: No   Family History  Problem Relation Age of Onset  . Diabetes Mother   . Diabetes Maternal Aunt   . Heart disease Maternal Aunt      (had pacemaker)  . Diabetes Maternal Grandmother   . Stroke Mother   . Stroke Father   . Stroke Maternal Grandmother    Allergies  Allergen Reactions  . Ace Inhibitors     REACTION: COUGH  . Amoxicillin-Pot Clavulanate     REACTION: diarrhea  . Amoxicillin-Pot Clavulanate Other (See Comments)    Pt unaware of reaction to augmentin  . Azithromycin Diarrhea  . Cyclobenzaprine Hcl     REACTION: diarrhea  . Flexeril (Cyclobenzaprine)   . Flonase (Fluticasone Propionate)  nosebleed  . Ibuprofen     REACTION: vomiting   Current Outpatient Prescriptions on File Prior to Visit  Medication Sig Dispense Refill  . ACCU-CHEK AVIVA PLUS test strip USE EVERY DAY TO CHECK GLUCOSE AND TO CHECK AS NEEDED  100 strip  2  . aspirin 81 MG tablet Take 81 mg by mouth daily.        Marland Kitchen desonide (DESOWEN) 0.05 % cream as needed.      . diltiazem (CARDIZEM CD) 240 MG 24 hr capsule Take 1 capsule (240 mg total) by mouth daily.  90 capsule  3  . hydrochlorothiazide (MICROZIDE) 12.5 MG capsule Take 1 capsule (12.5 mg total) by mouth daily.  90 capsule  3  . hydrocortisone 2.5 % cream Apply topically 2 (two) times daily as needed.      Marland Kitchen ketoconazole (NIZORAL) 2 % cream Apply topically as needed.      Marland Kitchen ketoconazole (NIZORAL) 2 % shampoo Apply 1 application topically 2 (two)  times a week.      Marland Kitchen ketoconazole (NIZORAL) 2 % shampoo USE AS DIRECTED TWICE WEEKLY PRN  120 mL  3  . latanoprost (XALATAN) 0.005 % ophthalmic solution Place 1 drop into both eyes at bedtime.       Marland Kitchen loperamide (IMODIUM) 1 MG/5ML solution Take 1 mg by mouth 4 (four) times daily as needed.      . metFORMIN (GLUCOPHAGE) 500 MG tablet Take 1 tablet (500 mg total) by mouth 2 (two) times daily with a meal.  180 tablet  3  . omeprazole (PRILOSEC) 20 MG capsule Take 1 capsule (20 mg total) by mouth 2 (two) times daily.  180 capsule  3  . potassium chloride SA (K-DUR,KLOR-CON) 20 MEQ tablet Take 1 tablet (20 mEq total) by mouth daily.  90 tablet  3  . pravastatin (PRAVACHOL) 40 MG tablet Take 1 tablet (40 mg total) by mouth every evening.  90 tablet  3  . PROAIR HFA 108 (90 BASE) MCG/ACT inhaler Ad lib.      . promethazine (PHENERGAN) 25 MG tablet Take 25 mg by mouth every 6 (six) hours as needed.      Marland Kitchen Propylene Glycol (SYSTANE BALANCE OP) Apply to eye.      Marland Kitchen UNKNOWN TO PATIENT 2-3 other topical agents (creams and powder) for yeast infections      . [DISCONTINUED] Calcium Carbonate-Vitamin D (CALCIUM-VITAMIN D) 500-200 MG-UNIT per tablet Take 1 tablet by mouth 2 (two) times daily with a meal.        . [DISCONTINUED] fexofenadine (ALLEGRA) 180 MG tablet Take 1 tablet (180 mg total) by mouth daily.  15 tablet  0        Review of Systems Review of Systems  Constitutional: Negative for fever, appetite change, fatigue and unexpected weight change.  ENT pos for cong and rhinorrhea , neg for sinus pain  Eyes: Negative for pain and visual disturbance.  Respiratory: Negative for wheeze  and shortness of breath.   Cardiovascular: Negative for cp or palpitations    Gastrointestinal: Negative for nausea, diarrhea and constipation.  Genitourinary: Negative for urgency and frequency.  Skin: Negative for pallor or rash   Neurological: Negative for weakness, light-headedness, numbness and headaches.   Hematological: Negative for adenopathy. Does not bruise/bleed easily.  Psychiatric/Behavioral: Negative for dysphoric mood. The patient is not nervous/anxious.         Objective:   Physical Exam  Constitutional: She appears well-developed and well-nourished. No distress.  HENT:  Head: Normocephalic and atraumatic.  Right Ear: External ear normal.  Left Ear: External ear normal.  Mouth/Throat: Oropharynx is clear and moist. No oropharyngeal exudate.       Nares are injected and congested   Clear rhinorrhea noted  No sinus tendenress  Eyes: Conjunctivae normal and EOM are normal. Pupils are equal, round, and reactive to light. Right eye exhibits no discharge. Left eye exhibits no discharge. No scleral icterus.  Neck: Normal range of motion. Neck supple. No JVD present. Carotid bruit is not present. No thyromegaly present.  Cardiovascular: Normal rate, regular rhythm, normal heart sounds and intact distal pulses.  Exam reveals no gallop.   Pulmonary/Chest: Effort normal and breath sounds normal. No respiratory distress. She has no wheezes. She has no rales.       Harsh bs Good air exch No rales or rhonchi   Abdominal: She exhibits no abdominal bruit.  Musculoskeletal: Normal range of motion. She exhibits no edema and no tenderness.  Lymphadenopathy:    She has no cervical adenopathy.  Neurological: She is alert. She has normal reflexes. No cranial nerve deficit.  Skin: Skin is warm and dry. No rash noted. No erythema. No pallor.  Psychiatric: She has a normal mood and affect.       Baseline MR Repeats herself frequently-this is baseline          Assessment & Plan:

## 2012-01-16 ENCOUNTER — Telehealth: Payer: Self-pay

## 2012-01-16 NOTE — Telephone Encounter (Signed)
Pt.notified

## 2012-01-16 NOTE — Telephone Encounter (Signed)
No- just keep using the ketoconazole cream and keeping area clean and dry until she sees dermatology-thanks

## 2012-01-16 NOTE — Telephone Encounter (Addendum)
Pt left v/m pt has rectal itching; pt using baby wipes to clean self.pt already has appt to see Dr Milinda Antis 01/19/12 at 8:15 am. Pt request call back. I called pt and left v/m for pt to call back. Pt has appt to see skin doctor 01/29/11. Pt has been using Ketoconazole cream also.[facility] [place]. Pt wants call back if Dr Milinda Antis wants pt to do anything else until pt seen on 01/19/12.Please advise.

## 2012-01-19 ENCOUNTER — Ambulatory Visit (INDEPENDENT_AMBULATORY_CARE_PROVIDER_SITE_OTHER): Payer: Medicare Other | Admitting: Family Medicine

## 2012-01-19 ENCOUNTER — Encounter: Payer: Self-pay | Admitting: Family Medicine

## 2012-01-19 VITALS — BP 132/90 | HR 95 | Temp 97.6°F | Ht 62.0 in | Wt 195.8 lb

## 2012-01-19 DIAGNOSIS — L290 Pruritus ani: Secondary | ICD-10-CM

## 2012-01-19 MED ORDER — TRIAMCINOLONE ACETONIDE 0.5 % EX CREA: TOPICAL_CREAM | Freq: Every day | CUTANEOUS | Status: DC | PRN

## 2012-01-19 NOTE — Assessment & Plan Note (Signed)
This is ongoing and recurrent - has appt with dermatology next week hygeine improved and we disc imp of keeping dry No imp with ketoconazole Unremarkable exam today  Will try a steroid cream once daily and update

## 2012-01-19 NOTE — Patient Instructions (Addendum)
Keep irritated area clean and dry as we discussed  Use the new cream- triamcinolone once daily and see if this helps Follow up with the dermatologist as planned and take a neighbor with you  If worsened symptoms let me know

## 2012-01-19 NOTE — Progress Notes (Signed)
Subjective:    Patient ID: Linda Thompson, female    DOB: 1955-08-03, 56 y.o.   MRN: [id number]  HPI Here with chronic and recurrent pruitis ani  Using baby wipes and drying very well with hair dryer  Does not strain for BM No lumps or bleeding noted  Using the ketoconazole cream qd Has appt with derm for this next week-will take a neighbor with her   Patient Active Problem List  Diagnosis  . FUNGAL DERMATITIS  . MENINGIOMA  . DIABETES MELLITUS, TYPE II  . HYPERLIPIDEMIA  . MENTAL RETARDATION, MODERATE  . GLAUCOMA NOS  . HYPERTENSION  . ALLERGIC  RHINITIS  . GERD  . HIATAL HERNIA  . DERMATITIS, SEBORRHEIC NOS  . PRURITUS ANI  . OSTEOARTHRITIS, FOOT  . HEART MURMUR  . TRANSAMINASES, SERUM, ELEVATED  . Personal history of other musculoskeletal disorders  . IRRITABLE BOWEL SYNDROME  . Hypokalemia  . Back pain  . Candidal intertrigo  . Obesity  . Tinea pedis  . Chronic dermatitis of hands  . Adverse effects of medication  . Viral URI   Past Medical History  Diagnosis Date  . Allergic rhinitis, cause unspecified   . Pain in joint, ankle and foot   . Backache, unspecified     chronic LBP with radiculopathy  . Seborrheic dermatitis, unspecified   . Type II or unspecified type diabetes mellitus without mention of complication, not stated as uncontrolled   . History of fracture of arm     Right  . History of fracture of foot   . Dermatomycosis, unspecified   . Esophageal reflux   . Unspecified glaucoma(365.9)   . Undiagnosed cardiac murmurs   . Diaphragmatic hernia without mention of obstruction or gangrene   . Other and unspecified hyperlipidemia   . Unspecified essential hypertension   . Irritable bowel syndrome   . Benign neoplasm of cerebral meninges   . Moderate intellectual disabilities   . Nausea alone   . Osteoarthrosis, unspecified whether generalized or localized, ankle and foot   . Pruritus ani   . Unspecified tinnitus   . Nonspecific elevation of  levels of transaminase or lactic acid dehydrogenase (LDH)   . Leukorrhea, not specified as infective   . Obesity   . Hyperglycemia   . DDD (degenerative disc disease), lumbar   . Yeast infection of the skin     Frequent   Past Surgical History  Procedure Date  . Total abdominal hysterectomy   . Cataract extraction   . Elbow surgery     right elbow   History  Substance Use Topics  . Smoking status: Never Smoker   . Smokeless tobacco: Never Used  . Alcohol Use: No   Family History  Problem Relation Age of Onset  . Diabetes Mother   . Diabetes Maternal Aunt   . Heart disease Maternal Aunt      (had pacemaker)  . Diabetes Maternal Grandmother   . Stroke Mother   . Stroke Father   . Stroke Maternal Grandmother    Allergies  Allergen Reactions  . Ace Inhibitors     REACTION: COUGH  . Amoxicillin-Pot Clavulanate     REACTION: diarrhea  . Amoxicillin-Pot Clavulanate Other (See Comments)    Pt unaware of reaction to augmentin  . Azithromycin Diarrhea  . Cyclobenzaprine Hcl     REACTION: diarrhea  . Flexeril (Cyclobenzaprine)   . Flonase (Fluticasone Propionate)     nosebleed  . Ibuprofen     REACTION:  vomiting   Current Outpatient Prescriptions on File Prior to Visit  Medication Sig Dispense Refill  . ACCU-CHEK AVIVA PLUS test strip USE EVERY DAY TO CHECK GLUCOSE AND TO CHECK AS NEEDED  100 strip  2  . aspirin 81 MG tablet Take 81 mg by mouth daily.        Marland Kitchen desonide (DESOWEN) 0.05 % cream as needed.      Marland Kitchen dextromethorphan-guaiFENesin (MUCINEX DM) 30-600 MG per 12 hr tablet Take 1 tablet by mouth every 12 (twelve) hours as needed.      . diltiazem (CARDIZEM CD) 240 MG 24 hr capsule Take 1 capsule (240 mg total) by mouth daily.  90 capsule  3  . hydrochlorothiazide (MICROZIDE) 12.5 MG capsule Take 1 capsule (12.5 mg total) by mouth daily.  90 capsule  3  . hydrocortisone 2.5 % cream Apply topically 2 (two) times daily as needed.      Marland Kitchen ketoconazole (NIZORAL) 2 % cream  Apply topically as needed.      Marland Kitchen ketoconazole (NIZORAL) 2 % shampoo Apply 1 application topically 2 (two) times a week.      Marland Kitchen ketoconazole (NIZORAL) 2 % shampoo USE AS DIRECTED TWICE WEEKLY PRN  120 mL  3  . latanoprost (XALATAN) 0.005 % ophthalmic solution Place 1 drop into both eyes at bedtime.       Marland Kitchen loperamide (IMODIUM) 1 MG/5ML solution Take 1 mg by mouth 4 (four) times daily as needed.      . metFORMIN (GLUCOPHAGE) 500 MG tablet Take 1 tablet (500 mg total) by mouth 2 (two) times daily with a meal.  180 tablet  3  . omeprazole (PRILOSEC) 20 MG capsule Take 1 capsule (20 mg total) by mouth 2 (two) times daily.  180 capsule  3  . potassium chloride SA (K-DUR,KLOR-CON) 20 MEQ tablet Take 1 tablet (20 mEq total) by mouth daily.  90 tablet  3  . pravastatin (PRAVACHOL) 40 MG tablet Take 1 tablet (40 mg total) by mouth every evening.  90 tablet  3  . PROAIR HFA 108 (90 BASE) MCG/ACT inhaler Ad lib.      . promethazine (PHENERGAN) 25 MG tablet Take 25 mg by mouth every 6 (six) hours as needed.      Marland Kitchen Propylene Glycol (SYSTANE BALANCE OP) Apply to eye.      Marland Kitchen UNKNOWN TO PATIENT 2-3 other topical agents (creams and powder) for yeast infections      . [DISCONTINUED] Calcium Carbonate-Vitamin D (CALCIUM-VITAMIN D) 500-200 MG-UNIT per tablet Take 1 tablet by mouth 2 (two) times daily with a meal.        . [DISCONTINUED] fexofenadine (ALLEGRA) 180 MG tablet Take 1 tablet (180 mg total) by mouth daily.  15 tablet  0      Review of Systems Review of Systems  Constitutional: Negative for fever, appetite change, fatigue and unexpected weight change.  Eyes: Negative for pain and visual disturbance.  Respiratory: Negative for cough and shortness of breath.   Cardiovascular: Negative for cp or palpitations    Gastrointestinal: Negative for nausea, diarrhea and constipation. neg for straining or blood in stool Genitourinary: Negative for urgency and frequency.  Skin: Negative for pallor and pos for  rash/ itching  Neurological: Negative for weakness, light-headedness, numbness and headaches.  Hematological: Negative for adenopathy. Does not bruise/bleed easily.  Psychiatric/Behavioral: Negative for dysphoric mood. The patient is not nervous/anxious.         Objective:   Physical Exam  Constitutional:  She appears well-developed and well-nourished. No distress.       obese and well appearing   HENT:  Head: Normocephalic and atraumatic.  Eyes: Conjunctivae normal and EOM are normal. Pupils are equal, round, and reactive to light. No scleral icterus.  Neck: Normal range of motion. Neck supple.  Cardiovascular: Normal rate and regular rhythm.   Pulmonary/Chest: Effort normal and breath sounds normal.  Abdominal: Soft. Bowel sounds are normal. She exhibits no distension and no mass. There is no tenderness.  Musculoskeletal: She exhibits no edema.  Lymphadenopathy:    She has no cervical adenopathy.  Neurological: She is alert.  Skin: Skin is warm and dry. No rash noted. No erythema. No pallor.       Anal area appears normal -non tender and no ext hem seen  Nl tone  No open excoriations  Psychiatric: She has a normal mood and affect.       Baseline MR and repeats herself often          Assessment & Plan:

## 2012-01-26 ENCOUNTER — Ambulatory Visit: Payer: Medicare Other | Admitting: Gastroenterology

## 2012-01-29 DIAGNOSIS — L538 Other specified erythematous conditions: Secondary | ICD-10-CM | POA: Diagnosis not present

## 2012-02-11 ENCOUNTER — Telehealth: Payer: Self-pay | Admitting: Family Medicine

## 2012-02-11 ENCOUNTER — Encounter: Payer: Self-pay | Admitting: Family Medicine

## 2012-02-11 ENCOUNTER — Ambulatory Visit (INDEPENDENT_AMBULATORY_CARE_PROVIDER_SITE_OTHER): Payer: Medicare Other | Admitting: Family Medicine

## 2012-02-11 VITALS — BP 114/78 | HR 80 | Temp 97.9°F | Wt 195.0 lb

## 2012-02-11 DIAGNOSIS — K589 Irritable bowel syndrome without diarrhea: Secondary | ICD-10-CM | POA: Diagnosis not present

## 2012-02-11 NOTE — Assessment & Plan Note (Addendum)
Exacerbation today after eating more vegetables Explained that she will need to advance these slowly Disc symptoms in detail- no s/s of gastroenteritis  Disc diet and what to expect  Given handout and disc call back parameters

## 2012-02-11 NOTE — Patient Instructions (Addendum)
I think you had an irritable bowel episode today  It may take your body a bit of time to get used to eating vegetables - so slowly advance those (I want you to eat them but it will take a while for your system to get used to it)  Make sure to drink enough water  If you develop fever or blood in stool or worse symptoms please call

## 2012-02-11 NOTE — Telephone Encounter (Signed)
[facility] Triage Call Report Triage Record Num: [id number] Operator: Edgar Frisk Patient Name: Linda Thompson Call Date & Time: 02/11/2012 12:39:37AM Patient Phone: [phone] PCP: Audrie Gallus. [place] Patient Gender: Female PCP Fax : Patient DOB: Jan 31, 1955 Practice Name:  [facility] Reason for Call: Caller: [place]/Patient; PCP: Roxy Manns [facility]); CB#: [phone]; Call regarding Diarrhea; Pt reports has had change in bowel Habits. States when she had BM this pm that she passed "chunks of stool". No other symptoms. Denies emergent symptoms Advised to call back if develops any other symptoms Protocol(s) Used: Diabetes: Gastrointestinal Problems Recommended Outcome per Protocol: See Provider within 72 Hours Reason for Outcome: All other situations Care Advice: ~ 02/11/2012 12:46:45AM Page 1 of 1 [id number]

## 2012-02-11 NOTE — Telephone Encounter (Signed)
Called and reassured pt.  Encouraged her to call back if symptoms change or worsen.

## 2012-02-11 NOTE — Progress Notes (Signed)
Subjective:    Patient ID: Linda Thompson, female    DOB: 1955/06/07, 57 y.o.   MRN: [id number]  HPI Here for GI complaints   This am - had a BM - she had diarrhea (? If from something she ate)- very loose stool and some cramping  Also passed some hard stool with it  (somewhat explosive with gas)  She had tried to eat more vegetables lately  No blood or mucous in stool   Feels ok now  No fever or n/v and no abd pain - but has had a lot of gas today    She felt better after that  Ate crackers and drank diet sprite today   . Patient Active Problem List  Diagnosis  . FUNGAL DERMATITIS  . MENINGIOMA  . DIABETES MELLITUS, TYPE II  . HYPERLIPIDEMIA  . MENTAL RETARDATION, MODERATE  . GLAUCOMA NOS  . HYPERTENSION  . ALLERGIC  RHINITIS  . GERD  . HIATAL HERNIA  . DERMATITIS, SEBORRHEIC NOS  . PRURITUS ANI  . OSTEOARTHRITIS, FOOT  . HEART MURMUR  . TRANSAMINASES, SERUM, ELEVATED  . Personal history of other musculoskeletal disorders  . IRRITABLE BOWEL SYNDROME  . Hypokalemia  . Back pain  . Candidal intertrigo  . Obesity  . Tinea pedis  . Chronic dermatitis of hands  . Adverse effects of medication  . Viral URI   Past Medical History  Diagnosis Date  . Allergic rhinitis, cause unspecified   . Pain in joint, ankle and foot   . Backache, unspecified     chronic LBP with radiculopathy  . Seborrheic dermatitis, unspecified   . Type II or unspecified type diabetes mellitus without mention of complication, not stated as uncontrolled   . History of fracture of arm     Right  . History of fracture of foot   . Dermatomycosis, unspecified   . Esophageal reflux   . Unspecified glaucoma(365.9)   . Undiagnosed cardiac murmurs   . Diaphragmatic hernia without mention of obstruction or gangrene   . Other and unspecified hyperlipidemia   . Unspecified essential hypertension   . Irritable bowel syndrome   . Benign neoplasm of cerebral meninges   . Moderate intellectual  disabilities   . Nausea alone   . Osteoarthrosis, unspecified whether generalized or localized, ankle and foot   . Pruritus ani   . Unspecified tinnitus   . Nonspecific elevation of levels of transaminase or lactic acid dehydrogenase (LDH)   . Leukorrhea, not specified as infective   . Obesity   . Hyperglycemia   . DDD (degenerative disc disease), lumbar   . Yeast infection of the skin     Frequent   Past Surgical History  Procedure Date  . Total abdominal hysterectomy   . Cataract extraction   . Elbow surgery     right elbow   History  Substance Use Topics  . Smoking status: Never Smoker   . Smokeless tobacco: Never Used  . Alcohol Use: No   Family History  Problem Relation Age of Onset  . Diabetes Mother   . Diabetes Maternal Aunt   . Heart disease Maternal Aunt      (had pacemaker)  . Diabetes Maternal Grandmother   . Stroke Mother   . Stroke Father   . Stroke Maternal Grandmother    Allergies  Allergen Reactions  . Ace Inhibitors     REACTION: COUGH  . Amoxicillin-Pot Clavulanate     REACTION: diarrhea  . Amoxicillin-Pot Clavulanate  Other (See Comments)    Pt unaware of reaction to augmentin  . Azithromycin Diarrhea  . Cyclobenzaprine Hcl     REACTION: diarrhea  . Flexeril (Cyclobenzaprine)   . Flonase (Fluticasone Propionate)     nosebleed  . Ibuprofen     REACTION: vomiting   Current Outpatient Prescriptions on File Prior to Visit  Medication Sig Dispense Refill  . ACCU-CHEK AVIVA PLUS test strip USE EVERY DAY TO CHECK GLUCOSE AND TO CHECK AS NEEDED  100 strip  2  . aspirin 81 MG tablet Take 81 mg by mouth daily.        Marland Kitchen dextromethorphan-guaiFENesin (MUCINEX DM) 30-600 MG per 12 hr tablet Take 1 tablet by mouth every 12 (twelve) hours as needed.      . diltiazem (CARDIZEM CD) 240 MG 24 hr capsule Take 1 capsule (240 mg total) by mouth daily.  90 capsule  3  . hydrochlorothiazide (MICROZIDE) 12.5 MG capsule Take 1 capsule (12.5 mg total) by mouth  daily.  90 capsule  3  . ketoconazole (NIZORAL) 2 % cream Apply topically as needed.      Marland Kitchen ketoconazole (NIZORAL) 2 % shampoo Apply 1 application topically 2 (two) times a week.      Marland Kitchen ketoconazole (NIZORAL) 2 % shampoo USE AS DIRECTED TWICE WEEKLY PRN  120 mL  3  . latanoprost (XALATAN) 0.005 % ophthalmic solution Place 1 drop into both eyes at bedtime.       Marland Kitchen loperamide (IMODIUM) 1 MG/5ML solution Take 1 mg by mouth 4 (four) times daily as needed.      . metFORMIN (GLUCOPHAGE) 500 MG tablet Take 1 tablet (500 mg total) by mouth 2 (two) times daily with a meal.  180 tablet  3  . omeprazole (PRILOSEC) 20 MG capsule Take 1 capsule (20 mg total) by mouth 2 (two) times daily.  180 capsule  3  . potassium chloride SA (K-DUR,KLOR-CON) 20 MEQ tablet Take 1 tablet (20 mEq total) by mouth daily.  90 tablet  3  . pravastatin (PRAVACHOL) 40 MG tablet Take 1 tablet (40 mg total) by mouth every evening.  90 tablet  3  . PROAIR HFA 108 (90 BASE) MCG/ACT inhaler Ad lib.      . promethazine (PHENERGAN) 25 MG tablet Take 25 mg by mouth every 6 (six) hours as needed.      Marland Kitchen Propylene Glycol (SYSTANE BALANCE OP) Apply to eye.      . triamcinolone cream (KENALOG) 0.5 % Apply topically daily as needed. Apply to rectal area once daily as needed for itching  30 g  0  . UNKNOWN TO PATIENT 2-3 other topical agents (creams and powder) for yeast infections      . [DISCONTINUED] Calcium Carbonate-Vitamin D (CALCIUM-VITAMIN D) 500-200 MG-UNIT per tablet Take 1 tablet by mouth 2 (two) times daily with a meal.        . [DISCONTINUED] fexofenadine (ALLEGRA) 180 MG tablet Take 1 tablet (180 mg total) by mouth daily.  15 tablet  0    Review of Systems Review of Systems  Constitutional: Negative for fever, appetite change, fatigue and unexpected weight change.  Eyes: Negative for pain and visual disturbance.  Respiratory: Negative for cough and shortness of breath.   Cardiovascular: Negative for cp or palpitations      Gastrointestinal: Negative for nausea, vomiting or blood in stool Genitourinary: Negative for urgency and frequency.  Skin: Negative for pallor or rash   Neurological: Negative for weakness, light-headedness, numbness  and headaches.  Hematological: Negative for adenopathy. Does not bruise/bleed easily.  Psychiatric/Behavioral: Negative for dysphoric mood. The patient is not nervous/anxious.         Objective:   Physical Exam  Constitutional: She appears well-developed and well-nourished. No distress.       obese and well appearing   HENT:  Head: Normocephalic and atraumatic.  Mouth/Throat: Oropharynx is clear and moist.  Eyes: Conjunctivae normal and EOM are normal. Pupils are equal, round, and reactive to light. No scleral icterus.  Neck: Normal range of motion. Neck supple. No thyromegaly present.  Cardiovascular: Normal rate and regular rhythm.   Pulmonary/Chest: Effort normal and breath sounds normal.  Abdominal: Soft. Bowel sounds are normal. She exhibits no distension and no mass. There is no tenderness. There is no rebound and no guarding.  Lymphadenopathy:    She has no cervical adenopathy.  Neurological: She is alert.  Skin: Skin is warm and dry. No rash noted.  Psychiatric: She has a normal mood and affect.       Baseline MR Repeats herself frequently          Assessment & Plan:

## 2012-02-11 NOTE — Telephone Encounter (Signed)
She has IBS and gets alarmed every time her stools changed (she has mental impairment--- and that is hard for her to understand) I doubt she really needs to come in , could you call her and reassure that unless symptoms change or worsen she can just keep an eye on things?  Reassurance may go a long way, thanks

## 2012-02-16 ENCOUNTER — Ambulatory Visit (INDEPENDENT_AMBULATORY_CARE_PROVIDER_SITE_OTHER): Payer: Medicare Other | Admitting: Family Medicine

## 2012-02-16 ENCOUNTER — Encounter: Payer: Self-pay | Admitting: Family Medicine

## 2012-02-16 VITALS — BP 126/82 | HR 96 | Temp 97.9°F | Ht 62.0 in | Wt 196.5 lb

## 2012-02-16 DIAGNOSIS — K589 Irritable bowel syndrome without diarrhea: Secondary | ICD-10-CM | POA: Diagnosis not present

## 2012-02-16 NOTE — Progress Notes (Signed)
Subjective:    Patient ID: Linda Thompson, female    DOB: [birth year], 57 y.o.   MRN: [id number]  HPI Here for f/u of IBS- was here recently with an episode of diarrhea  Last time she was worried about a possible stomach virus- but better   Has the ok to use immodium if she is going out with her church - to prevent diarrhea  She is very worried about using this as needed- "someone" told her this is not good  ( I assure her over and over it is ok)  Has been doing ok overall  Urinating normally  Bowels are moving ok - and uses baby wipes to cleanse (hx of pruritis ani)   Having BM 1-2 times per day- is on the loose side but not bad (normal for her) No constipation No blood in stool No abdominal pain   Still working with her lawyer to get help for care- perhaps a residence of some sort   Patient Active Problem List  Diagnosis  . FUNGAL DERMATITIS  . MENINGIOMA  . DIABETES MELLITUS, TYPE II  . HYPERLIPIDEMIA  . MENTAL RETARDATION, MODERATE  . GLAUCOMA NOS  . HYPERTENSION  . ALLERGIC  RHINITIS  . GERD  . HIATAL HERNIA  . DERMATITIS, SEBORRHEIC NOS  . PRURITUS ANI  . OSTEOARTHRITIS, FOOT  . HEART MURMUR  . TRANSAMINASES, SERUM, ELEVATED  . Personal history of other musculoskeletal disorders  . IRRITABLE BOWEL SYNDROME  . Hypokalemia  . Back pain  . Candidal intertrigo  . Obesity  . Tinea pedis  . Chronic dermatitis of hands  . Adverse effects of medication  . Viral URI   Past Medical History  Diagnosis Date  . Allergic rhinitis, cause unspecified   . Pain in joint, ankle and foot   . Backache, unspecified     chronic LBP with radiculopathy  . Seborrheic dermatitis, unspecified   . Type II or unspecified type diabetes mellitus without mention of complication, not stated as uncontrolled   . History of fracture of arm     Right  . History of fracture of foot   . Dermatomycosis, unspecified   . Esophageal reflux   . Unspecified glaucoma(365.9)   . Undiagnosed  cardiac murmurs   . Diaphragmatic hernia without mention of obstruction or gangrene   . Other and unspecified hyperlipidemia   . Unspecified essential hypertension   . Irritable bowel syndrome   . Benign neoplasm of cerebral meninges   . Moderate intellectual disabilities   . Nausea alone   . Osteoarthrosis, unspecified whether generalized or localized, ankle and foot   . Pruritus ani   . Unspecified tinnitus   . Nonspecific elevation of levels of transaminase or lactic acid dehydrogenase (LDH)   . Leukorrhea, not specified as infective   . Obesity   . Hyperglycemia   . DDD (degenerative disc disease), lumbar   . Yeast infection of the skin     Frequent   Past Surgical History  Procedure Date  . Total abdominal hysterectomy   . Cataract extraction   . Elbow surgery     right elbow   History  Substance Use Topics  . Smoking status: Never Smoker   . Smokeless tobacco: Never Used  . Alcohol Use: No   Family History  Problem Relation Age of Onset  . Diabetes Mother   . Diabetes Maternal Aunt   . Heart disease Maternal Aunt      (had pacemaker)  . Diabetes Maternal  Grandmother   . Stroke Mother   . Stroke Father   . Stroke Maternal Grandmother    Allergies  Allergen Reactions  . Ace Inhibitors     REACTION: COUGH  . Amoxicillin-Pot Clavulanate     REACTION: diarrhea  . Amoxicillin-Pot Clavulanate Other (See Comments)    Pt unaware of reaction to augmentin  . Azithromycin Diarrhea  . Cyclobenzaprine Hcl     REACTION: diarrhea  . Flexeril (Cyclobenzaprine)   . Flonase (Fluticasone Propionate)     nosebleed  . Ibuprofen     REACTION: vomiting   Current Outpatient Prescriptions on File Prior to Visit  Medication Sig Dispense Refill  . ACCU-CHEK AVIVA PLUS test strip USE EVERY DAY TO CHECK GLUCOSE AND TO CHECK AS NEEDED  100 strip  2  . aspirin 81 MG tablet Take 81 mg by mouth daily.        Marland Kitchen dextromethorphan-guaiFENesin (MUCINEX DM) 30-600 MG per 12 hr tablet  Take 1 tablet by mouth every 12 (twelve) hours as needed.      . diltiazem (CARDIZEM CD) 240 MG 24 hr capsule Take 1 capsule (240 mg total) by mouth daily.  90 capsule  3  . hydrochlorothiazide (MICROZIDE) 12.5 MG capsule Take 1 capsule (12.5 mg total) by mouth daily.  90 capsule  3  . ketoconazole (NIZORAL) 2 % cream Apply topically as needed.      Marland Kitchen ketoconazole (NIZORAL) 2 % shampoo Apply 1 application topically 2 (two) times a week.      . latanoprost (XALATAN) 0.005 % ophthalmic solution Place 1 drop into both eyes at bedtime.       Marland Kitchen loperamide (IMODIUM) 1 MG/5ML solution Take 1 mg by mouth 4 (four) times daily as needed.      . metFORMIN (GLUCOPHAGE) 500 MG tablet Take 1 tablet (500 mg total) by mouth 2 (two) times daily with a meal.  180 tablet  3  . omeprazole (PRILOSEC) 20 MG capsule Take 1 capsule (20 mg total) by mouth 2 (two) times daily.  180 capsule  3  . potassium chloride SA (K-DUR,KLOR-CON) 20 MEQ tablet Take 1 tablet (20 mEq total) by mouth daily.  90 tablet  3  . pravastatin (PRAVACHOL) 40 MG tablet Take 1 tablet (40 mg total) by mouth every evening.  90 tablet  3  . PROAIR HFA 108 (90 BASE) MCG/ACT inhaler Ad lib.      . promethazine (PHENERGAN) 25 MG tablet Take 25 mg by mouth every 6 (six) hours as needed.      Marland Kitchen Propylene Glycol (SYSTANE BALANCE OP) Apply to eye.      . triamcinolone cream (KENALOG) 0.5 % Apply topically daily as needed. Apply to rectal area once daily as needed for itching  30 g  0  . UNKNOWN TO PATIENT 2-3 other topical agents (creams and powder) for yeast infections      . [DISCONTINUED] Calcium Carbonate-Vitamin D (CALCIUM-VITAMIN D) 500-200 MG-UNIT per tablet Take 1 tablet by mouth 2 (two) times daily with a meal.        . [DISCONTINUED] fexofenadine (ALLEGRA) 180 MG tablet Take 1 tablet (180 mg total) by mouth daily.  15 tablet  0    Review of Systems Review of Systems  Constitutional: Negative for fever, appetite change, fatigue and unexpected  weight change.  Eyes: Negative for pain and visual disturbance.  Respiratory: Negative for cough and shortness of breath.   Cardiovascular: Negative for cp or palpitations    Gastrointestinal:  Negative for nausea,  and constipation. neg for abd pain or blood in stool , pos for occas loose stool Genitourinary: Negative for urgency and frequency.  Skin: Negative for pallor or rash   Neurological: Negative for weakness, light-headedness, numbness and headaches.  Hematological: Negative for adenopathy. Does not bruise/bleed easily.  Psychiatric/Behavioral: Negative for dysphoric mood. The patient is not nervous/anxious.         Objective:   Physical Exam  Constitutional: She appears well-developed and well-nourished. No distress.       obese and well appearing   HENT:  Head: Normocephalic and atraumatic.  Mouth/Throat: Oropharynx is clear and moist.  Eyes: Conjunctivae normal and EOM are normal. Pupils are equal, round, and reactive to light. No scleral icterus.  Neck: Normal range of motion. Neck supple. No thyromegaly present.  Cardiovascular: Normal rate and regular rhythm.        Rate in high 80s when sitting  Pulmonary/Chest: Effort normal and breath sounds normal.  Abdominal: Soft. Bowel sounds are normal. She exhibits no distension and no mass. There is no tenderness. There is no rebound and no guarding.  Neurological: She is alert. She has normal reflexes.  Skin: Skin is warm and dry. No rash noted. No pallor.  Psychiatric: She has a normal mood and affect.          Assessment & Plan:

## 2012-02-16 NOTE — Assessment & Plan Note (Signed)
No new symptoms since last visit- pt just wanted permission again to use her otc immodium type diarrhea med as needed for going out when she is afraid she will have an urgent bm Again disc diet - I would like her to slowly advance more vegetables (she meets with nutrition next mo also)  If diarrhea becomes persistant will stop metformin - but doing well now

## 2012-02-16 NOTE — Patient Instructions (Addendum)
It is fine to use your over the counter diarrhea medicine once in a while (when going out)- to treat or prevent urgent bowel movements Don't worry about that - it is fine  Keep slowly incorporating more vegetables over the counter  See the diabetes practitioner next week as planned  Take care of yourself

## 2012-02-27 ENCOUNTER — Ambulatory Visit (INDEPENDENT_AMBULATORY_CARE_PROVIDER_SITE_OTHER): Payer: Medicare Other | Admitting: Family Medicine

## 2012-02-27 ENCOUNTER — Encounter: Payer: Self-pay | Admitting: Family Medicine

## 2012-02-27 VITALS — BP 128/82 | HR 92 | Temp 97.7°F | Ht 62.0 in | Wt 199.5 lb

## 2012-02-27 DIAGNOSIS — M549 Dorsalgia, unspecified: Secondary | ICD-10-CM | POA: Diagnosis not present

## 2012-02-27 DIAGNOSIS — B372 Candidiasis of skin and nail: Secondary | ICD-10-CM

## 2012-02-27 LAB — POCT URINALYSIS DIPSTICK
Blood, UA: NEGATIVE
Ketones, UA: NEGATIVE
Spec Grav, UA: 1.02
Urobilinogen, UA: 0.2
pH, UA: 6

## 2012-02-27 NOTE — Assessment & Plan Note (Addendum)
ua today is borderline- sent for culture  Suspect this is her chronic MSK pain  Adv to walk

## 2012-02-27 NOTE — Patient Instructions (Addendum)
I think the rash in between your legs is yeast  Keep using the creams that the dermatologist gave you  1) desonide .05% twice daily 2) ketoconazole 2% twice daily 3) atapro gel twice daily  IF NO FURTHER IMPROVEMENT IN 2 WEEKS PLEASE CALL YOUR DERMATOLOGIST'S OFFICE AND LET THEM KNOW   Also keep the area very very dry - even using a hair dryer on cool to dry after bathing  If you sweat- dry area well and change clothes  We will check a urine specimen also

## 2012-02-27 NOTE — Assessment & Plan Note (Signed)
Ongoing problem in obese diabetic with mental impairment and fair hygeine  Rev derm notes Re outline plan in detail incl use of desonide/ ketoconazole and anti itch gel  If no imp will call and f/u with derm Disc imp of drying area -see AVS

## 2012-02-27 NOTE — Progress Notes (Signed)
Subjective:    Patient ID: Linda Thompson, female    DOB: 1956/01/13, 57 y.o.   MRN: [id number]  HPI Here for f/u of rash in between her legs - with hx of intertrigo/ candidal  She has been tx here and at her dermatologist Also diabetic Last recomm was desonide .05% and also ketoconazole2% and also atapro gel aaa (all bid )-- the atapro is an anti itch gel  Has had more problems with her back - lower and on the L  Does urinate frequently baseline - she wants to check ua  No burning or flank pain or blood in urine and no fever   Patient Active Problem List  Diagnosis  . FUNGAL DERMATITIS  . MENINGIOMA  . DIABETES MELLITUS, TYPE II  . HYPERLIPIDEMIA  . MENTAL RETARDATION, MODERATE  . GLAUCOMA NOS  . HYPERTENSION  . ALLERGIC  RHINITIS  . GERD  . HIATAL HERNIA  . DERMATITIS, SEBORRHEIC NOS  . PRURITUS ANI  . OSTEOARTHRITIS, FOOT  . HEART MURMUR  . TRANSAMINASES, SERUM, ELEVATED  . Personal history of other musculoskeletal disorders  . IRRITABLE BOWEL SYNDROME  . Hypokalemia  . Back pain  . Candidal intertrigo  . Obesity  . Tinea pedis  . Chronic dermatitis of hands  . Adverse effects of medication   Past Medical History  Diagnosis Date  . Allergic rhinitis, cause unspecified   . Pain in joint, ankle and foot   . Backache, unspecified     chronic LBP with radiculopathy  . Seborrheic dermatitis, unspecified   . Type II or unspecified type diabetes mellitus without mention of complication, not stated as uncontrolled   . History of fracture of arm     Right  . History of fracture of foot   . Dermatomycosis, unspecified   . Esophageal reflux   . Unspecified glaucoma(365.9)   . Undiagnosed cardiac murmurs   . Diaphragmatic hernia without mention of obstruction or gangrene   . Other and unspecified hyperlipidemia   . Unspecified essential hypertension   . Irritable bowel syndrome   . Benign neoplasm of cerebral meninges   . Moderate intellectual disabilities   .  Nausea alone   . Osteoarthrosis, unspecified whether generalized or localized, ankle and foot   . Pruritus ani   . Unspecified tinnitus   . Nonspecific elevation of levels of transaminase or lactic acid dehydrogenase (LDH)   . Leukorrhea, not specified as infective   . Obesity   . Hyperglycemia   . DDD (degenerative disc disease), lumbar   . Yeast infection of the skin     Frequent   Past Surgical History  Procedure Date  . Total abdominal hysterectomy   . Cataract extraction   . Elbow surgery     right elbow   History  Substance Use Topics  . Smoking status: Never Smoker   . Smokeless tobacco: Never Used  . Alcohol Use: No   Family History  Problem Relation Age of Onset  . Diabetes Mother   . Diabetes Maternal Aunt   . Heart disease Maternal Aunt      (had pacemaker)  . Diabetes Maternal Grandmother   . Stroke Mother   . Stroke Father   . Stroke Maternal Grandmother    Allergies  Allergen Reactions  . Ace Inhibitors     REACTION: COUGH  . Amoxicillin-Pot Clavulanate     REACTION: diarrhea  . Amoxicillin-Pot Clavulanate Other (See Comments)    Pt unaware of reaction to augmentin  .  Azithromycin Diarrhea  . Cyclobenzaprine Hcl     REACTION: diarrhea  . Flexeril (Cyclobenzaprine)   . Flonase (Fluticasone Propionate)     nosebleed  . Ibuprofen     REACTION: vomiting   Current Outpatient Prescriptions on File Prior to Visit  Medication Sig Dispense Refill  . ACCU-CHEK AVIVA PLUS test strip USE EVERY DAY TO CHECK GLUCOSE AND TO CHECK AS NEEDED  100 strip  2  . aspirin 81 MG tablet Take 81 mg by mouth daily.        Marland Kitchen dextromethorphan-guaiFENesin (MUCINEX DM) 30-600 MG per 12 hr tablet Take 1 tablet by mouth every 12 (twelve) hours as needed.      . diltiazem (CARDIZEM CD) 240 MG 24 hr capsule Take 1 capsule (240 mg total) by mouth daily.  90 capsule  3  . hydrochlorothiazide (MICROZIDE) 12.5 MG capsule Take 1 capsule (12.5 mg total) by mouth daily.  90 capsule  3   . ketoconazole (NIZORAL) 2 % cream Apply topically as needed.      Marland Kitchen ketoconazole (NIZORAL) 2 % shampoo Apply 1 application topically 2 (two) times a week.      . latanoprost (XALATAN) 0.005 % ophthalmic solution Place 1 drop into both eyes at bedtime.       Marland Kitchen loperamide (IMODIUM) 1 MG/5ML solution Take 1 mg by mouth 4 (four) times daily as needed.      . metFORMIN (GLUCOPHAGE) 500 MG tablet Take 1 tablet (500 mg total) by mouth 2 (two) times daily with a meal.  180 tablet  3  . omeprazole (PRILOSEC) 20 MG capsule Take 1 capsule (20 mg total) by mouth 2 (two) times daily.  180 capsule  3  . potassium chloride SA (K-DUR,KLOR-CON) 20 MEQ tablet Take 1 tablet (20 mEq total) by mouth daily.  90 tablet  3  . pravastatin (PRAVACHOL) 40 MG tablet Take 1 tablet (40 mg total) by mouth every evening.  90 tablet  3  . PROAIR HFA 108 (90 BASE) MCG/ACT inhaler Ad lib.      . promethazine (PHENERGAN) 25 MG tablet Take 25 mg by mouth every 6 (six) hours as needed.      Marland Kitchen Propylene Glycol (SYSTANE BALANCE OP) Apply to eye.      Marland Kitchen UNKNOWN TO PATIENT 2-3 other topical agents (creams and powder) for yeast infections      . [DISCONTINUED] Calcium Carbonate-Vitamin D (CALCIUM-VITAMIN D) 500-200 MG-UNIT per tablet Take 1 tablet by mouth 2 (two) times daily with a meal.        . [DISCONTINUED] fexofenadine (ALLEGRA) 180 MG tablet Take 1 tablet (180 mg total) by mouth daily.  15 tablet  0    Review of Systems Review of Systems  Constitutional: Negative for fever, appetite change, fatigue and unexpected weight change.  Eyes: Negative for pain and visual disturbance.  Respiratory: Negative for cough and shortness of breath.   Cardiovascular: Negative for cp or palpitations    Gastrointestinal: Negative for nausea, diarrhea and constipation.  Genitourinary: Negative for urgency and frequency.  Skin: Negative for pallor and pos for red itchy rash between legs (improved under breasts)  Neurological: Negative for  weakness, light-headedness, numbness and headaches.  MSK pos for L lower back pain recurrent / neg for joint swelling  Hematological: Negative for adenopathy. Does not bruise/bleed easily.  Psychiatric/Behavioral: Negative for dysphoric mood. The patient is not nervous/anxious.         Objective:   Physical Exam  Constitutional: She appears  well-developed and well-nourished. No distress.  obese and well appearing  hygeine is not optimal   HENT:  Head: Normocephalic and atraumatic.  Mouth/Throat: Oropharynx is clear and moist.  Eyes: Conjunctivae and EOM are normal. Pupils are equal, round, and reactive to light. Right eye exhibits no discharge. Left eye exhibits no discharge.  Neck: Normal range of motion. Neck supple. No thyromegaly present.  Cardiovascular: Normal rate, regular rhythm and normal heart sounds.  Exam reveals no gallop.   Pulmonary/Chest: Effort normal and breath sounds normal.  Musculoskeletal: She exhibits no edema.  Lymphadenopathy:    She has no cervical adenopathy.  Neurological: She is alert.  Skin: Skin is warm and dry. Rash noted. There is erythema. No pallor.  Erythematous rash over inner thigh area bilaterally with a few satellite lesions Few excoriations  No areas of drainage or bacterial infection  Erythema under breasts is much improved   Psychiatric: She has a normal mood and affect.          Assessment & Plan:

## 2012-02-29 LAB — POCT UA - MICROSCOPIC ONLY
Casts, Ur, LPF, POC: 0
Crystals, Ur, HPF, POC: 0
Yeast, UA: 0

## 2012-02-29 LAB — URINE CULTURE

## 2012-03-02 ENCOUNTER — Encounter: Payer: Self-pay | Admitting: *Deleted

## 2012-03-05 ENCOUNTER — Ambulatory Visit: Payer: Medicare Other | Admitting: *Deleted

## 2012-03-17 ENCOUNTER — Ambulatory Visit (INDEPENDENT_AMBULATORY_CARE_PROVIDER_SITE_OTHER): Payer: Medicare Other | Admitting: Family Medicine

## 2012-03-17 ENCOUNTER — Encounter: Payer: Self-pay | Admitting: Family Medicine

## 2012-03-17 VITALS — BP 128/70 | HR 96 | Temp 98.4°F | Ht 62.0 in | Wt 199.8 lb

## 2012-03-17 DIAGNOSIS — L290 Pruritus ani: Secondary | ICD-10-CM | POA: Diagnosis not present

## 2012-03-17 NOTE — Progress Notes (Signed)
Subjective:    Patient ID: Linda Thompson, female    DOB: 1955/05/07, 57 y.o.   MRN: [id number]  HPI Is having anal itching again  Got out her atapro gel - and has been using it for several days No constipation or excessive straining  No bleeding/ blood in stool  Uses baby wipes to clean after BMs- and dries off well afterward  No hemorroids that she can feel   Uses ivory soap for bathing  Takes shower or bath 1-2 times per week   Her other fungal rashes have been in fair control  Patient Active Problem List  Diagnosis  . FUNGAL DERMATITIS  . MENINGIOMA  . DIABETES MELLITUS, TYPE II  . HYPERLIPIDEMIA  . MENTAL RETARDATION, MODERATE  . GLAUCOMA NOS  . HYPERTENSION  . ALLERGIC  RHINITIS  . GERD  . HIATAL HERNIA  . DERMATITIS, SEBORRHEIC NOS  . PRURITUS ANI  . OSTEOARTHRITIS, FOOT  . HEART MURMUR  . TRANSAMINASES, SERUM, ELEVATED  . Personal history of other musculoskeletal disorders  . IRRITABLE BOWEL SYNDROME  . Hypokalemia  . Low back pain  . Candidal intertrigo  . Obesity  . Tinea pedis  . Chronic dermatitis of hands  . Adverse effects of medication   Past Medical History  Diagnosis Date  . Allergic rhinitis, cause unspecified   . Pain in joint, ankle and foot   . Backache, unspecified     chronic LBP with radiculopathy  . Seborrheic dermatitis, unspecified   . Type II or unspecified type diabetes mellitus without mention of complication, not stated as uncontrolled   . History of fracture of arm     Right  . History of fracture of foot   . Dermatomycosis, unspecified   . Esophageal reflux   . Unspecified glaucoma(365.9)   . Undiagnosed cardiac murmurs   . Diaphragmatic hernia without mention of obstruction or gangrene   . Other and unspecified hyperlipidemia   . Unspecified essential hypertension   . Irritable bowel syndrome   . Benign neoplasm of cerebral meninges   . Moderate intellectual disabilities   . Nausea alone   . Osteoarthrosis,  unspecified whether generalized or localized, ankle and foot   . Pruritus ani   . Unspecified tinnitus   . Nonspecific elevation of levels of transaminase or lactic acid dehydrogenase (LDH)   . Leukorrhea, not specified as infective   . Obesity   . Hyperglycemia   . DDD (degenerative disc disease), lumbar   . Yeast infection of the skin     Frequent   Past Surgical History  Procedure Laterality Date  . Total abdominal hysterectomy    . Cataract extraction    . Elbow surgery      right elbow   History  Substance Use Topics  . Smoking status: Never Smoker   . Smokeless tobacco: Never Used  . Alcohol Use: No   Family History  Problem Relation Age of Onset  . Diabetes Mother   . Diabetes Maternal Aunt   . Heart disease Maternal Aunt      (had pacemaker)  . Diabetes Maternal Grandmother   . Stroke Mother   . Stroke Father   . Stroke Maternal Grandmother    Allergies  Allergen Reactions  . Ace Inhibitors     REACTION: COUGH  . Amoxicillin-Pot Clavulanate     REACTION: diarrhea  . Amoxicillin-Pot Clavulanate Other (See Comments)    Pt unaware of reaction to augmentin  . Azithromycin Diarrhea  . Cyclobenzaprine  Hcl     REACTION: diarrhea  . Flexeril (Cyclobenzaprine)   . Flonase (Fluticasone Propionate)     nosebleed  . Ibuprofen     REACTION: vomiting   Current Outpatient Prescriptions on File Prior to Visit  Medication Sig Dispense Refill  . ACCU-CHEK AVIVA PLUS test strip USE EVERY DAY TO CHECK GLUCOSE AND TO CHECK AS NEEDED  100 strip  2  . aspirin 81 MG tablet Take 81 mg by mouth daily.        Marland Kitchen dextromethorphan-guaiFENesin (MUCINEX DM) 30-600 MG per 12 hr tablet Take 1 tablet by mouth every 12 (twelve) hours as needed.      . diltiazem (CARDIZEM CD) 240 MG 24 hr capsule Take 1 capsule (240 mg total) by mouth daily.  90 capsule  3  . hydrochlorothiazide (MICROZIDE) 12.5 MG capsule Take 1 capsule (12.5 mg total) by mouth daily.  90 capsule  3  . ketoconazole  (NIZORAL) 2 % cream Apply topically as needed.      Marland Kitchen ketoconazole (NIZORAL) 2 % shampoo Apply 1 application topically 2 (two) times a week.      . latanoprost (XALATAN) 0.005 % ophthalmic solution Place 1 drop into both eyes at bedtime.       Marland Kitchen loperamide (IMODIUM) 1 MG/5ML solution Take 1 mg by mouth 4 (four) times daily as needed.      . metFORMIN (GLUCOPHAGE) 500 MG tablet Take 1 tablet (500 mg total) by mouth 2 (two) times daily with a meal.  180 tablet  3  . omeprazole (PRILOSEC) 20 MG capsule Take 1 capsule (20 mg total) by mouth 2 (two) times daily.  180 capsule  3  . potassium chloride SA (K-DUR,KLOR-CON) 20 MEQ tablet Take 1 tablet (20 mEq total) by mouth daily.  90 tablet  3  . pravastatin (PRAVACHOL) 40 MG tablet Take 1 tablet (40 mg total) by mouth every evening.  90 tablet  3  . PROAIR HFA 108 (90 BASE) MCG/ACT inhaler Ad lib.      . promethazine (PHENERGAN) 25 MG tablet Take 25 mg by mouth every 6 (six) hours as needed.      Marland Kitchen Propylene Glycol (SYSTANE BALANCE OP) Apply to eye.      Marland Kitchen UNKNOWN TO PATIENT 2-3 other topical agents (creams and powder) for yeast infections      . [DISCONTINUED] Calcium Carbonate-Vitamin D (CALCIUM-VITAMIN D) 500-200 MG-UNIT per tablet Take 1 tablet by mouth 2 (two) times daily with a meal.        . [DISCONTINUED] fexofenadine (ALLEGRA) 180 MG tablet Take 1 tablet (180 mg total) by mouth daily.  15 tablet  0   No current facility-administered medications on file prior to visit.       Review of Systems Review of Systems  Constitutional: Negative for fever, appetite change, fatigue and unexpected weight change.  Eyes: Negative for pain and visual disturbance.  Respiratory: Negative for cough and shortness of breath.   Cardiovascular: Negative for cp or palpitations    Gastrointestinal: Negative for nausea, diarrhea and constipation.  Genitourinary: Negative for urgency and frequency.  Skin: Negative for pallor or rash  pos for itching   Neurological: Negative for weakness, light-headedness, numbness and headaches.  Hematological: Negative for adenopathy. Does not bruise/bleed easily.  Psychiatric/Behavioral: Negative for dysphoric mood. The patient is not nervous/anxious.         Objective:   Physical Exam  Constitutional: She appears well-developed and well-nourished. No distress.  HENT:  Head: Normocephalic  and atraumatic.  Mouth/Throat: Oropharynx is clear and moist.  Eyes: Conjunctivae and EOM are normal. Pupils are equal, round, and reactive to light.  Neck: Normal range of motion. Neck supple.  Cardiovascular: Normal rate and regular rhythm.   Abdominal: Soft. Bowel sounds are normal.  Musculoskeletal: She exhibits no edema.  Lymphadenopathy:    She has no cervical adenopathy.  Neurological: She is alert.  Skin: Skin is warm and dry.  Skin appears normal without rash or excoriation in anal area and no hemorrhoids seen  Rash under breasts is much improved No rash in groin area   Psychiatric: She has a normal mood and affect.  Baseline MR- repeating herself frequently pleasant          Assessment & Plan:

## 2012-03-17 NOTE — Assessment & Plan Note (Signed)
Rev dermatologist's prev inst with pt - she will continue the atapro anti itch gel when needed only  Disc hygeiene- may benefit from more frequent bathing Will use ketoconazole cream only if redness begins Nl exam today

## 2012-03-17 NOTE — Patient Instructions (Addendum)
For anal itching - continue cleansing with non scented baby wipes after bowel movements and then dry the area well  Use atapro gel as directed for itch (as needed)  If you get a rash (you do not have a rash now)-  Then use the ketoconzaole as well) If your itching does not improve - see your dermatologist  Your rash in other areas is looking much better

## 2012-03-18 DIAGNOSIS — H1045 Other chronic allergic conjunctivitis: Secondary | ICD-10-CM | POA: Diagnosis not present

## 2012-03-18 DIAGNOSIS — H4011X0 Primary open-angle glaucoma, stage unspecified: Secondary | ICD-10-CM | POA: Diagnosis not present

## 2012-03-18 DIAGNOSIS — H04129 Dry eye syndrome of unspecified lacrimal gland: Secondary | ICD-10-CM | POA: Diagnosis not present

## 2012-03-24 ENCOUNTER — Ambulatory Visit (INDEPENDENT_AMBULATORY_CARE_PROVIDER_SITE_OTHER): Payer: Medicare Other | Admitting: Family Medicine

## 2012-03-24 ENCOUNTER — Encounter: Payer: Self-pay | Admitting: Family Medicine

## 2012-03-24 VITALS — BP 130/82 | HR 94 | Temp 98.0°F | Ht 62.0 in | Wt 200.8 lb

## 2012-03-24 DIAGNOSIS — J069 Acute upper respiratory infection, unspecified: Secondary | ICD-10-CM | POA: Diagnosis not present

## 2012-03-24 NOTE — Progress Notes (Signed)
Subjective:    Patient ID: Linda Thompson, female    DOB: 04-20-1955, 57 y.o.   MRN: [id number]  HPI Here with uri symptoms -started yesterday She has been resting and drinking lots of water  Started with scratchy throat - so she started salt water gargles (at adv of her pharmacist)  Also losenges Then started coughing - and did spit out some phlegm (she has mucinex DM) Some sneezing -but no real congestion yet No headache or fever   Patient Active Problem List  Diagnosis  . FUNGAL DERMATITIS  . MENINGIOMA  . DIABETES MELLITUS, TYPE II  . HYPERLIPIDEMIA  . MENTAL RETARDATION, MODERATE  . GLAUCOMA NOS  . HYPERTENSION  . ALLERGIC  RHINITIS  . GERD  . HIATAL HERNIA  . DERMATITIS, SEBORRHEIC NOS  . PRURITUS ANI  . OSTEOARTHRITIS, FOOT  . HEART MURMUR  . TRANSAMINASES, SERUM, ELEVATED  . Personal history of other musculoskeletal disorders  . IRRITABLE BOWEL SYNDROME  . Hypokalemia  . Low back pain  . Candidal intertrigo  . Obesity  . Tinea pedis  . Chronic dermatitis of hands  . Adverse effects of medication   Past Medical History  Diagnosis Date  . Allergic rhinitis, cause unspecified   . Pain in joint, ankle and foot   . Backache, unspecified     chronic LBP with radiculopathy  . Seborrheic dermatitis, unspecified   . Type II or unspecified type diabetes mellitus without mention of complication, not stated as uncontrolled   . History of fracture of arm     Right  . History of fracture of foot   . Dermatomycosis, unspecified   . Esophageal reflux   . Unspecified glaucoma(365.9)   . Undiagnosed cardiac murmurs   . Diaphragmatic hernia without mention of obstruction or gangrene   . Other and unspecified hyperlipidemia   . Unspecified essential hypertension   . Irritable bowel syndrome   . Benign neoplasm of cerebral meninges   . Moderate intellectual disabilities   . Nausea alone   . Osteoarthrosis, unspecified whether generalized or localized, ankle and foot    . Pruritus ani   . Unspecified tinnitus   . Nonspecific elevation of levels of transaminase or lactic acid dehydrogenase (LDH)   . Leukorrhea, not specified as infective   . Obesity   . Hyperglycemia   . DDD (degenerative disc disease), lumbar   . Yeast infection of the skin     Frequent   Past Surgical History  Procedure Laterality Date  . Total abdominal hysterectomy    . Cataract extraction    . Elbow surgery      right elbow   History  Substance Use Topics  . Smoking status: Never Smoker   . Smokeless tobacco: Never Used  . Alcohol Use: No   Family History  Problem Relation Age of Onset  . Diabetes Mother   . Diabetes Maternal Aunt   . Heart disease Maternal Aunt      (had pacemaker)  . Diabetes Maternal Grandmother   . Stroke Mother   . Stroke Father   . Stroke Maternal Grandmother    Allergies  Allergen Reactions  . Ace Inhibitors     REACTION: COUGH  . Amoxicillin-Pot Clavulanate     REACTION: diarrhea  . Amoxicillin-Pot Clavulanate Other (See Comments)    Pt unaware of reaction to augmentin  . Azithromycin Diarrhea  . Cyclobenzaprine Hcl     REACTION: diarrhea  . Flexeril (Cyclobenzaprine)   . Flonase (Fluticasone Propionate)  nosebleed  . Ibuprofen     REACTION: vomiting   Current Outpatient Prescriptions on File Prior to Visit  Medication Sig Dispense Refill  . ACCU-CHEK AVIVA PLUS test strip USE EVERY DAY TO CHECK GLUCOSE AND TO CHECK AS NEEDED  100 strip  2  . aspirin 81 MG tablet Take 81 mg by mouth daily.        Marland Kitchen dextromethorphan-guaiFENesin (MUCINEX DM) 30-600 MG per 12 hr tablet Take 1 tablet by mouth every 12 (twelve) hours as needed.      . diltiazem (CARDIZEM CD) 240 MG 24 hr capsule Take 1 capsule (240 mg total) by mouth daily.  90 capsule  3  . hydrochlorothiazide (MICROZIDE) 12.5 MG capsule Take 1 capsule (12.5 mg total) by mouth daily.  90 capsule  3  . ketoconazole (NIZORAL) 2 % cream Apply topically as needed.      Marland Kitchen  ketoconazole (NIZORAL) 2 % shampoo Apply 1 application topically 2 (two) times a week.      . latanoprost (XALATAN) 0.005 % ophthalmic solution Place 1 drop into both eyes at bedtime.       Marland Kitchen loperamide (IMODIUM) 1 MG/5ML solution Take 1 mg by mouth 4 (four) times daily as needed.      . metFORMIN (GLUCOPHAGE) 500 MG tablet Take 1 tablet (500 mg total) by mouth 2 (two) times daily with a meal.  180 tablet  3  . omeprazole (PRILOSEC) 20 MG capsule Take 1 capsule (20 mg total) by mouth 2 (two) times daily.  180 capsule  3  . potassium chloride SA (K-DUR,KLOR-CON) 20 MEQ tablet Take 1 tablet (20 mEq total) by mouth daily.  90 tablet  3  . pravastatin (PRAVACHOL) 40 MG tablet Take 1 tablet (40 mg total) by mouth every evening.  90 tablet  3  . PROAIR HFA 108 (90 BASE) MCG/ACT inhaler Ad lib.      . promethazine (PHENERGAN) 25 MG tablet Take 25 mg by mouth every 6 (six) hours as needed.      Marland Kitchen Propylene Glycol (SYSTANE BALANCE OP) Apply to eye.      Marland Kitchen UNKNOWN TO PATIENT 2-3 other topical agents (creams and powder) for yeast infections      . [DISCONTINUED] Calcium Carbonate-Vitamin D (CALCIUM-VITAMIN D) 500-200 MG-UNIT per tablet Take 1 tablet by mouth 2 (two) times daily with a meal.        . [DISCONTINUED] fexofenadine (ALLEGRA) 180 MG tablet Take 1 tablet (180 mg total) by mouth daily.  15 tablet  0   No current facility-administered medications on file prior to visit.      Review of Systems Review of Systems  Constitutional: Negative for fever, appetite change,  and unexpected weight change.  ENT pos for post nasal drip and scratchy throat / neg for facial or ear pain  Eyes: Negative for pain and visual disturbance.  Respiratory: Negative for wheeze and shortness of breath.   Cardiovascular: Negative for cp or palpitations    Gastrointestinal: Negative for nausea, diarrhea and constipation.  Genitourinary: Negative for urgency and frequency.  Skin: Negative for pallor or rash    Neurological: Negative for weakness, light-headedness, numbness and headaches.  Hematological: Negative for adenopathy. Does not bruise/bleed easily.  Psychiatric/Behavioral: Negative for dysphoric mood. The patient is not nervous/anxious.         Objective:   Physical Exam  Constitutional: She appears well-developed and well-nourished. No distress.  obese and well appearing t  HENT:  Head: Normocephalic and atraumatic.  Right Ear: External ear normal.  Left Ear: External ear normal.  Mouth/Throat: Oropharynx is clear and moist. No oropharyngeal exudate.  Nares are injected and congested    Eyes: Conjunctivae and EOM are normal. Pupils are equal, round, and reactive to light. Right eye exhibits no discharge. Left eye exhibits no discharge.  Neck: Normal range of motion. Neck supple.  Cardiovascular: Normal rate and regular rhythm.   Pulmonary/Chest: Effort normal and breath sounds normal. No respiratory distress. She has no wheezes. She has no rales. She exhibits no tenderness.  Lymphadenopathy:    She has no cervical adenopathy.  Neurological: She is alert.  Skin: Skin is warm and dry. No rash noted.  Psychiatric: She has a normal mood and affect.  Baseline MR - repeats herself frequently          Assessment & Plan:

## 2012-03-24 NOTE — Assessment & Plan Note (Signed)
Mild and early on - explained her symptoms will likely get worse before they improve and given and easy to read handout  I rec she continue mucinex dm and salt water gargles  Disc symptomatic care - see instructions on AVS  Update if not starting to improve in a week or if worsening

## 2012-03-24 NOTE — Patient Instructions (Addendum)
I think you have a cold (a viral upper respiratory infection) - and it will likely get worse before it gets better  Continue salt water gargles if throat is sore/ losenges are ok  mucinex DM will help the cough -that is ok  If you feel feverish- achey or chilled- you can take tylenol as directed Drink lots of fluids and get rest Update if not starting to improve in a week or if worsening

## 2012-03-29 ENCOUNTER — Encounter: Payer: Medicare Other | Attending: Family Medicine | Admitting: *Deleted

## 2012-03-29 VITALS — Ht 62.0 in | Wt 193.2 lb

## 2012-03-29 DIAGNOSIS — Z713 Dietary counseling and surveillance: Secondary | ICD-10-CM | POA: Diagnosis not present

## 2012-03-29 DIAGNOSIS — E119 Type 2 diabetes mellitus without complications: Secondary | ICD-10-CM | POA: Insufficient documentation

## 2012-03-29 NOTE — Progress Notes (Signed)
  Medical Nutrition Therapy:  Appt start time: 1100 end time:  1130.   Assessment:  Primary concerns today: Linda Thompson is here for a follow up appointment for diabetes education.  She has not had her HbA1c checked since last visit in October.  She eats out most meals and is inactive.  Most of her meals are heavy in carbohydrate.  She often has 2 starches at meals.  However, she has lost some weight.     MEDICATIONS: see list   DIETARY INTAKE:  Usual eating pattern includes 3 meals and 1-3 snacks per day.   24-hr recall:  B ( AM): large bowl of grits.  May eat out sometimes  Snk ( AM): maybe fruit  L ( PM): sandwich or [employer] Snk ( PM): fruit D ( PM): chicken, starch, vegetable Snk ( PM): none Beverages: water or unsweet tea  Usual physical activity: none  Estimated energy needs: 1400 calories  158 g carbohydrates  105 g protein  39 g fat   Progress Towards Goal(s): Some progress.   Nutritional Diagnosis:  NB-1.6: Limited adherence to nutrition related recommendations as related to increasing vegetables and exercise.  As evidenced by dietary recall    Intervention:  Nutrition counseling provided.  Recommended 1 source of carbohydrate/meal.  For example eat rice or bread or pasta or potatoes.  Do not eat multiple sources of carbohydrate.  Aim for vegetables at each lunch and dinner.  Also reiterated the importance of physical activity.  Enlisted neighbor to assist with regular activity.     Monitoring/Evaluation:  Dietary intake, exercise, HbA1c, and body weight prn.

## 2012-03-29 NOTE — Patient Instructions (Addendum)
Eat 1 starch each meal.  Choose either rice or bread or pasta or potatoes.  Not both  Make sure you always have a vegetable at lunch and dinner  Limit portions.    Healthy snacks:  Apple slices with peanut butter  1/2 banana with peanut butter  Cheese and wheat crackers  Yogurt  Fruit and cheese  Try and take a walk 3 days a week for 20 minutes  Try oatmeal with nuts or smaller bowl of grits with some eggs for breakfast

## 2012-03-30 ENCOUNTER — Ambulatory Visit: Payer: Medicare Other | Admitting: Psychology

## 2012-03-31 ENCOUNTER — Encounter: Payer: Self-pay | Admitting: Family Medicine

## 2012-03-31 ENCOUNTER — Ambulatory Visit (INDEPENDENT_AMBULATORY_CARE_PROVIDER_SITE_OTHER): Payer: Medicare Other | Admitting: Family Medicine

## 2012-03-31 VITALS — BP 130/82 | HR 95 | Temp 97.9°F | Ht 62.0 in | Wt 198.5 lb

## 2012-03-31 DIAGNOSIS — K589 Irritable bowel syndrome without diarrhea: Secondary | ICD-10-CM

## 2012-03-31 NOTE — Patient Instructions (Addendum)
I think you had a bout of your irritable bowel syndrome this am  Make sure to drink enough fluids today  If you develop fever or abdominal pain or nausea or vomiting- please call to let me know Try to eat a healthy diet

## 2012-03-31 NOTE — Progress Notes (Signed)
Subjective:    Patient ID: Linda Thompson, female    DOB: June 30, 1955, 57 y.o.   MRN: [id number]  HPI Here for GI symptoms  Feels generally weak  Loose bowels once this am  No nausea or vomiting  No fever No abdominal pain   Is eating and drinking normally  Patient Active Problem List  Diagnosis  . FUNGAL DERMATITIS  . MENINGIOMA  . DIABETES MELLITUS, TYPE II  . HYPERLIPIDEMIA  . MENTAL RETARDATION, MODERATE  . GLAUCOMA NOS  . HYPERTENSION  . ALLERGIC  RHINITIS  . GERD  . HIATAL HERNIA  . DERMATITIS, SEBORRHEIC NOS  . PRURITUS ANI  . OSTEOARTHRITIS, FOOT  . HEART MURMUR  . TRANSAMINASES, SERUM, ELEVATED  . Personal history of other musculoskeletal disorders  . IRRITABLE BOWEL SYNDROME  . Hypokalemia  . Low back pain  . Candidal intertrigo  . Obesity  . Tinea pedis  . Chronic dermatitis of hands  . Adverse effects of medication  . Viral URI with cough   Past Medical History  Diagnosis Date  . Allergic rhinitis, cause unspecified   . Pain in joint, ankle and foot   . Backache, unspecified     chronic LBP with radiculopathy  . Seborrheic dermatitis, unspecified   . Type II or unspecified type diabetes mellitus without mention of complication, not stated as uncontrolled   . History of fracture of arm     Right  . History of fracture of foot   . Dermatomycosis, unspecified   . Esophageal reflux   . Unspecified glaucoma(365.9)   . Undiagnosed cardiac murmurs   . Diaphragmatic hernia without mention of obstruction or gangrene   . Other and unspecified hyperlipidemia   . Unspecified essential hypertension   . Irritable bowel syndrome   . Benign neoplasm of cerebral meninges   . Moderate intellectual disabilities   . Nausea alone   . Osteoarthrosis, unspecified whether generalized or localized, ankle and foot   . Pruritus ani   . Unspecified tinnitus   . Nonspecific elevation of levels of transaminase or lactic acid dehydrogenase (LDH)   . Leukorrhea, not  specified as infective   . Obesity   . Hyperglycemia   . DDD (degenerative disc disease), lumbar   . Yeast infection of the skin     Frequent   Past Surgical History  Procedure Laterality Date  . Total abdominal hysterectomy    . Cataract extraction    . Elbow surgery      right elbow   History  Substance Use Topics  . Smoking status: Never Smoker   . Smokeless tobacco: Never Used  . Alcohol Use: No   Family History  Problem Relation Age of Onset  . Diabetes Mother   . Diabetes Maternal Aunt   . Heart disease Maternal Aunt      (had pacemaker)  . Diabetes Maternal Grandmother   . Stroke Mother   . Stroke Father   . Stroke Maternal Grandmother    Allergies  Allergen Reactions  . Ace Inhibitors     REACTION: COUGH  . Amoxicillin-Pot Clavulanate     REACTION: diarrhea  . Amoxicillin-Pot Clavulanate Other (See Comments)    Pt unaware of reaction to augmentin  . Azithromycin Diarrhea  . Cyclobenzaprine Hcl     REACTION: diarrhea  . Flexeril (Cyclobenzaprine)   . Flonase (Fluticasone Propionate)     nosebleed  . Ibuprofen     REACTION: vomiting   Current Outpatient Prescriptions on File Prior to  Visit  Medication Sig Dispense Refill  . ACCU-CHEK AVIVA PLUS test strip USE EVERY DAY TO CHECK GLUCOSE AND TO CHECK AS NEEDED  100 strip  2  . aspirin 81 MG tablet Take 81 mg by mouth daily.        Marland Kitchen dextromethorphan-guaiFENesin (MUCINEX DM) 30-600 MG per 12 hr tablet Take 1 tablet by mouth every 12 (twelve) hours as needed.      . diltiazem (CARDIZEM CD) 240 MG 24 hr capsule Take 1 capsule (240 mg total) by mouth daily.  90 capsule  3  . hydrochlorothiazide (MICROZIDE) 12.5 MG capsule Take 1 capsule (12.5 mg total) by mouth daily.  90 capsule  3  . ketoconazole (NIZORAL) 2 % cream Apply topically as needed.      Marland Kitchen ketoconazole (NIZORAL) 2 % shampoo Apply 1 application topically 2 (two) times a week.      . latanoprost (XALATAN) 0.005 % ophthalmic solution Place 1 drop into  both eyes at bedtime.       Marland Kitchen loperamide (IMODIUM) 1 MG/5ML solution Take 1 mg by mouth 4 (four) times daily as needed.      . metFORMIN (GLUCOPHAGE) 500 MG tablet Take 1 tablet (500 mg total) by mouth 2 (two) times daily with a meal.  180 tablet  3  . omeprazole (PRILOSEC) 20 MG capsule Take 1 capsule (20 mg total) by mouth 2 (two) times daily.  180 capsule  3  . potassium chloride SA (K-DUR,KLOR-CON) 20 MEQ tablet Take 1 tablet (20 mEq total) by mouth daily.  90 tablet  3  . pravastatin (PRAVACHOL) 40 MG tablet Take 1 tablet (40 mg total) by mouth every evening.  90 tablet  3  . PROAIR HFA 108 (90 BASE) MCG/ACT inhaler Ad lib.      . promethazine (PHENERGAN) 25 MG tablet Take 25 mg by mouth every 6 (six) hours as needed.      Marland Kitchen Propylene Glycol (SYSTANE BALANCE OP) Apply to eye.      Marland Kitchen UNKNOWN TO PATIENT 2-3 other topical agents (creams and powder) for yeast infections      . [DISCONTINUED] Calcium Carbonate-Vitamin D (CALCIUM-VITAMIN D) 500-200 MG-UNIT per tablet Take 1 tablet by mouth 2 (two) times daily with a meal.        . [DISCONTINUED] fexofenadine (ALLEGRA) 180 MG tablet Take 1 tablet (180 mg total) by mouth daily.  15 tablet  0   No current facility-administered medications on file prior to visit.        Review of Systems Review of Systems  Constitutional: Negative for fever, appetite change, fatigue and unexpected weight change.  Eyes: Negative for pain and visual disturbance.  Respiratory: Negative for cough and shortness of breath.   Cardiovascular: Negative for cp or palpitations    Gastrointestinal: Negative for nausea, and constipation. pos for one loose stool but no diarrhea , neg for abd pain and vomiting  Genitourinary: Negative for urgency and frequency.  Skin: Negative for pallor or rash   Neurological: Negative for weakness, light-headedness, numbness and headaches.  Hematological: Negative for adenopathy. Does not bruise/bleed easily.  Psychiatric/Behavioral:  Negative for dysphoric mood. The patient is not nervous/anxious.         Objective:   Physical Exam  Constitutional: She appears well-developed and well-nourished. No distress.  HENT:  Head: Normocephalic and atraumatic.  Mouth/Throat: Oropharynx is clear and moist.  Eyes: Conjunctivae and EOM are normal. Pupils are equal, round, and reactive to light. Right eye exhibits no  discharge. Left eye exhibits no discharge.  Neck: Normal range of motion. Neck supple. No JVD present. No thyromegaly present.  Cardiovascular: Normal rate and regular rhythm.   Pulmonary/Chest: Effort normal and breath sounds normal.  Abdominal: Soft. Bowel sounds are normal. She exhibits no distension and no mass. There is no tenderness. There is no rebound and no guarding.  Lymphadenopathy:    She has no cervical adenopathy.  Neurological: She is alert.  Skin: Skin is warm and dry. No rash noted. No pallor.  Brisk capillary refil  Nl skin turgor  Psychiatric: She has a normal mood and affect.          Assessment & Plan:

## 2012-03-31 NOTE — Assessment & Plan Note (Signed)
Pt had one loose stool today and was worried about gastroenteritis  No other symptoms - feels fine now Disc diet and keeping up fluid May use a dose of otc immodium for going out if needed  Will keep me updated

## 2012-04-02 DIAGNOSIS — E1149 Type 2 diabetes mellitus with other diabetic neurological complication: Secondary | ICD-10-CM | POA: Diagnosis not present

## 2012-04-02 DIAGNOSIS — L608 Other nail disorders: Secondary | ICD-10-CM | POA: Diagnosis not present

## 2012-04-10 ENCOUNTER — Encounter (HOSPITAL_COMMUNITY): Payer: Self-pay | Admitting: Emergency Medicine

## 2012-04-10 ENCOUNTER — Emergency Department (INDEPENDENT_AMBULATORY_CARE_PROVIDER_SITE_OTHER): Payer: Medicare Other

## 2012-04-10 ENCOUNTER — Emergency Department (INDEPENDENT_AMBULATORY_CARE_PROVIDER_SITE_OTHER)
Admission: EM | Admit: 2012-04-10 | Discharge: 2012-04-10 | Disposition: A | Discharge status: Home / Self Care | Payer: Medicare Other | Source: Home / Self Care

## 2012-04-10 DIAGNOSIS — R52 Pain, unspecified: Secondary | ICD-10-CM | POA: Diagnosis not present

## 2012-04-10 DIAGNOSIS — R1013 Epigastric pain: Secondary | ICD-10-CM | POA: Diagnosis not present

## 2012-04-10 DIAGNOSIS — E119 Type 2 diabetes mellitus without complications: Secondary | ICD-10-CM | POA: Diagnosis not present

## 2012-04-10 DIAGNOSIS — K5900 Constipation, unspecified: Secondary | ICD-10-CM | POA: Diagnosis not present

## 2012-04-10 DIAGNOSIS — R141 Gas pain: Secondary | ICD-10-CM

## 2012-04-10 DIAGNOSIS — R143 Flatulence: Secondary | ICD-10-CM | POA: Diagnosis not present

## 2012-04-10 DIAGNOSIS — R109 Unspecified abdominal pain: Secondary | ICD-10-CM | POA: Diagnosis not present

## 2012-04-10 DIAGNOSIS — R110 Nausea: Secondary | ICD-10-CM | POA: Diagnosis not present

## 2012-04-10 LAB — POCT URINALYSIS DIP (DEVICE)
Hgb urine dipstick: NEGATIVE
Ketones, ur: NEGATIVE mg/dL
Protein, ur: NEGATIVE mg/dL
Specific Gravity, Urine: 1.025 (ref 1.005–1.030)
pH: 5.5 (ref 5.0–8.0)

## 2012-04-10 NOTE — ED Notes (Signed)
Pt c/o abdominal pain since this morning. Took medications as scheduled this morning. Ate a normal breakfast, started to use skim milk. Felt pain and nauseous after eating. BM 2-3 times since eating. No loose, bloody or watery stools. No fever. Pt has a hernia and believes pain is related to that. Patient is alert and oriented.

## 2012-04-10 NOTE — ED Provider Notes (Signed)
History     CSN: [id number]  Arrival date & time 04/10/12  1153   First MD Initiated Contact with Patient 04/10/12 1252      Chief Complaint  Patient presents with  . Abdominal Pain    (Consider location/radiation/quality/duration/timing/severity/associated sxs/prior treatment) HPI Comments: 57 year old obese female presents with complaints of abdominal pain. Approximately 11 AM she experienced acute pain across her upper abdomen. It is associated with nausea without vomiting. Neither does she have diarrhea. She denies fever, chest pain, shortness of breath or other symptoms. There is a history of diaphragmatic hernia, type 2 diabetes and hypertension. When she experienced the symptoms she came directly to the urgent care. While obtaining her vital signs and other information her pain and other symptoms began to subside. When I was examining her she stated that she no longer had any of these symptoms above and felt normal. This morning she took her usual medications a.c., ate her breakfast and then took her PCP medications which include metformin. 7 afterward she had a normal bowel movement and after the bowel movement is when her acute pain developed.   Past Medical History  Diagnosis Date  . Allergic rhinitis, cause unspecified   . Pain in joint, ankle and foot   . Backache, unspecified     chronic LBP with radiculopathy  . Seborrheic dermatitis, unspecified   . Type II or unspecified type diabetes mellitus without mention of complication, not stated as uncontrolled   . History of fracture of arm     Right  . History of fracture of foot   . Dermatomycosis, unspecified   . Esophageal reflux   . Unspecified glaucoma(365.9)   . Undiagnosed cardiac murmurs   . Diaphragmatic hernia without mention of obstruction or gangrene   . Other and unspecified hyperlipidemia   . Unspecified essential hypertension   . Irritable bowel syndrome   . Benign neoplasm of cerebral meninges   .  Moderate intellectual disabilities   . Nausea alone   . Osteoarthrosis, unspecified whether generalized or localized, ankle and foot   . Pruritus ani   . Unspecified tinnitus   . Nonspecific elevation of levels of transaminase or lactic acid dehydrogenase (LDH)   . Leukorrhea, not specified as infective   . Obesity   . Hyperglycemia   . DDD (degenerative disc disease), lumbar   . Yeast infection of the skin     Frequent    Past Surgical History  Procedure Laterality Date  . Total abdominal hysterectomy    . Cataract extraction    . Elbow surgery      right elbow    Family History  Problem Relation Age of Onset  . Diabetes Mother   . Diabetes Maternal Aunt   . Heart disease Maternal Aunt      (had pacemaker)  . Diabetes Maternal Grandmother   . Stroke Mother   . Stroke Father   . Stroke Maternal Grandmother     History  Substance Use Topics  . Smoking status: Never Smoker   . Smokeless tobacco: Never Used  . Alcohol Use: No    OB History   Grav Para Term Preterm Abortions TAB SAB Ect Mult Living                  Review of Systems  Constitutional: Negative.   HENT: Negative.   Respiratory: Negative.   Cardiovascular: Negative.   Gastrointestinal: Positive for nausea and abdominal pain. Negative for vomiting, diarrhea, constipation and blood in  stool.       As per history of present illness  Genitourinary: Negative.   Neurological: Negative.     Allergies  Ace inhibitors; Amoxicillin-pot clavulanate; Amoxicillin-pot clavulanate; Azithromycin; Cyclobenzaprine hcl; Flexeril; Flonase; and Ibuprofen  Home Medications   Current Outpatient Rx  Name  Route  Sig  Dispense  Refill  . aspirin 81 MG tablet   Oral   Take 81 mg by mouth daily.           Marland Kitchen diltiazem (CARDIZEM CD) 240 MG 24 hr capsule   Oral   Take 1 capsule (240 mg total) by mouth daily.   90 capsule   3   . hydrochlorothiazide (MICROZIDE) 12.5 MG capsule   Oral   Take 1 capsule (12.5 mg  total) by mouth daily.   90 capsule   3   . metFORMIN (GLUCOPHAGE) 500 MG tablet   Oral   Take 1 tablet (500 mg total) by mouth 2 (two) times daily with a meal.   180 tablet   3   . omeprazole (PRILOSEC) 20 MG capsule   Oral   Take 1 capsule (20 mg total) by mouth 2 (two) times daily.   180 capsule   3   . potassium chloride (KLOR-CON) 20 MEQ packet   Oral   Take 20 mEq by mouth daily.         . potassium chloride SA (K-DUR,KLOR-CON) 20 MEQ tablet   Oral   Take 1 tablet (20 mEq total) by mouth daily.   90 tablet   3   . pravastatin (PRAVACHOL) 40 MG tablet   Oral   Take 1 tablet (40 mg total) by mouth every evening.   90 tablet   3   . ACCU-CHEK AVIVA PLUS test strip      USE EVERY DAY TO CHECK GLUCOSE AND TO CHECK AS NEEDED   100 strip   2     DX Code Needed .   . dextromethorphan-guaiFENesin (MUCINEX DM) 30-600 MG per 12 hr tablet   Oral   Take 1 tablet by mouth every 12 (twelve) hours as needed.         Marland Kitchen ketoconazole (NIZORAL) 2 % cream   Topical   Apply topically as needed.         Marland Kitchen ketoconazole (NIZORAL) 2 % shampoo   Topical   Apply 1 application topically 2 (two) times a week.         . latanoprost (XALATAN) 0.005 % ophthalmic solution   Both Eyes   Place 1 drop into both eyes at bedtime.          Marland Kitchen loperamide (IMODIUM) 1 MG/5ML solution   Oral   Take 1 mg by mouth 4 (four) times daily as needed.         Marland Kitchen PROAIR HFA 108 (90 BASE) MCG/ACT inhaler      Ad lib.         . promethazine (PHENERGAN) 25 MG tablet   Oral   Take 25 mg by mouth every 6 (six) hours as needed.         Marland Kitchen Propylene Glycol (SYSTANE BALANCE OP)   Ophthalmic   Apply to eye.         Marland Kitchen UNKNOWN TO PATIENT      2-3 other topical agents (creams and powder) for yeast infections           BP 129/83  Pulse 101  Temp(Src) 98.7 F (37.1 C)  Physical Exam  Nursing note and vitals reviewed. Constitutional: She is oriented to person, place, and time.  She appears well-developed. No distress.  Morbid obesity  HENT:  Head: Normocephalic.  Eyes: Conjunctivae and EOM are normal.  Neck: Normal range of motion. Neck supple.  Cardiovascular: Normal rate, regular rhythm and normal heart sounds.   Pulmonary/Chest: Effort normal and breath sounds normal. No respiratory distress. She has no wheezes. She has no rales.  Abdominal: Soft. Bowel sounds are normal. She exhibits no distension and no mass. There is no rebound and no guarding.   the abdominal exam to palpation was performed 3 times. The initial examination revealed minor tenderness in the para umbilical areas. Repeat exams revealed no tenderness in the abdomen. No tenderness in the epigastrium for which she states the pain originated and no other abdominal tenderness.   Musculoskeletal: She exhibits no edema and no tenderness.  Lymphadenopathy:    She has no cervical adenopathy.  Neurological: She is alert and oriented to person, place, and time. She exhibits normal muscle tone.  Skin: Skin is warm and dry. Rash noted.  Intertrigo in the lower abdominal flat roll.    ED Course  Procedures (including critical care time)  Labs Reviewed  POCT URINALYSIS DIP (DEVICE) - Abnormal; Notable for the following:    Glucose, UA 100 (*)    Leukocytes, UA SMALL (*)    All other components within normal limits   Dg Abd 1 View  04/10/2012  *RADIOLOGY REPORT*  Clinical Data: Abdominal pain, nausea.  ABDOMEN - 1 VIEW  Comparison: 08/01/2010  Findings: Small bowel decompressed.  Moderate fecal material in the proximal colon, decompressed distally.  No abnormal abdominal calcifications.  A transitional lumbosacral segment is noted.  IMPRESSION:  Nonobstructive bowel gas pattern with moderate proximal colonic fecal material.   Original Report Authenticated By: D. Andria Rhein, MD      1. Constipation   2. Gas pain   3. Abdominal pain, acute, epigastric   4. Diabetes mellitus       MDM  Patient has  not been observed for over an hour since arriving. She has had no additional or recurrent symptoms. States she feels generally well. No abdominal pain.  The x-ray demonstrates large amount of stool and gas in the colon. No abnormal gas pattern or abnormal fluid levels.  It is recommended that she take MiraLax to help with constipation. Drink plenty of fluids and follow up with your doctor next week. For any new symptoms problems or worsening may return.  She is discharged in a stable condition.   Hayden Rasmussen, NP 04/10/12 1523

## 2012-04-10 NOTE — ED Provider Notes (Signed)
Medical screening examination/treatment/procedure(s) were performed by non-physician practitioner and as supervising physician I was immediately available for consultation/collaboration.  Raynald Blend, MD 04/10/12 1524

## 2012-04-19 ENCOUNTER — Ambulatory Visit (INDEPENDENT_AMBULATORY_CARE_PROVIDER_SITE_OTHER): Payer: Medicare Other | Admitting: Family Medicine

## 2012-04-19 ENCOUNTER — Encounter: Payer: Self-pay | Admitting: Family Medicine

## 2012-04-19 VITALS — BP 108/72 | HR 98 | Temp 97.6°F | Ht 62.0 in | Wt 197.8 lb

## 2012-04-19 DIAGNOSIS — K589 Irritable bowel syndrome without diarrhea: Secondary | ICD-10-CM | POA: Diagnosis not present

## 2012-04-19 DIAGNOSIS — J029 Acute pharyngitis, unspecified: Secondary | ICD-10-CM

## 2012-04-19 LAB — POCT RAPID STREP A (OFFICE): Rapid Strep A Screen: NEGATIVE

## 2012-04-19 NOTE — Patient Instructions (Addendum)
Your throat test today is negative for strep Gargle with salt water Drink plenty of water  If sore throat gets severe-please let me know  I think your abdominal pain was from irritable bowel syndrome (constipation) and I'm glad the miralax helped (you can use this any time you get constipation)

## 2012-04-19 NOTE — Progress Notes (Signed)
Subjective:    Patient ID: Linda Thompson, female    DOB: 07-Jan-1956, 57 y.o.   MRN: [id number]  HPI Here for f/u of UC visit on 3/22 with abd pain , and also sore throat   She was seen in UC- for brief episode of abd pain (and brief nausea) This happened the morning after her neighbors brought her dinner - pasta and bread and cake (she did not eat the salad) Xray revealed moderate amt of retained stool and gas -no obstruction She was adv to try miralax to resolve this- she used it for 4 days in a row - and by Wednesday her bowels were loose  She bought a generic brand of that  Pt has longstanding hx of IBS and is very nervous about getting diarrhea so she takes immodium when she has to go out for any prolonged time Now her bowels are moving ok   Rapid  Strep- negative today  Mild sore throat Sneezed a bit yesterday No other symptoms    Patient Active Problem List  Diagnosis  . FUNGAL DERMATITIS  . MENINGIOMA  . DIABETES MELLITUS, TYPE II  . HYPERLIPIDEMIA  . MENTAL RETARDATION, MODERATE  . GLAUCOMA NOS  . HYPERTENSION  . ALLERGIC  RHINITIS  . GERD  . HIATAL HERNIA  . DERMATITIS, SEBORRHEIC NOS  . PRURITUS ANI  . OSTEOARTHRITIS, FOOT  . HEART MURMUR  . TRANSAMINASES, SERUM, ELEVATED  . Personal history of other musculoskeletal disorders  . IRRITABLE BOWEL SYNDROME  . Hypokalemia  . Low back pain  . Candidal intertrigo  . Obesity  . Tinea pedis  . Chronic dermatitis of hands  . Adverse effects of medication  . Viral URI with cough   Past Medical History  Diagnosis Date  . Allergic rhinitis, cause unspecified   . Pain in joint, ankle and foot   . Backache, unspecified     chronic LBP with radiculopathy  . Seborrheic dermatitis, unspecified   . Type II or unspecified type diabetes mellitus without mention of complication, not stated as uncontrolled   . History of fracture of arm     Right  . History of fracture of foot   . Dermatomycosis, unspecified   .  Esophageal reflux   . Unspecified glaucoma(365.9)   . Undiagnosed cardiac murmurs   . Diaphragmatic hernia without mention of obstruction or gangrene   . Other and unspecified hyperlipidemia   . Unspecified essential hypertension   . Irritable bowel syndrome   . Benign neoplasm of cerebral meninges   . Moderate intellectual disabilities   . Nausea alone   . Osteoarthrosis, unspecified whether generalized or localized, ankle and foot   . Pruritus ani   . Unspecified tinnitus   . Nonspecific elevation of levels of transaminase or lactic acid dehydrogenase (LDH)   . Leukorrhea, not specified as infective   . Obesity   . Hyperglycemia   . DDD (degenerative disc disease), lumbar   . Yeast infection of the skin     Frequent   Past Surgical History  Procedure Laterality Date  . Total abdominal hysterectomy    . Cataract extraction    . Elbow surgery      right elbow   History  Substance Use Topics  . Smoking status: Never Smoker   . Smokeless tobacco: Never Used  . Alcohol Use: No   Family History  Problem Relation Age of Onset  . Diabetes Mother   . Diabetes Maternal Aunt   . Heart  disease Maternal Aunt      (had pacemaker)  . Diabetes Maternal Grandmother   . Stroke Mother   . Stroke Father   . Stroke Maternal Grandmother    Allergies  Allergen Reactions  . Ace Inhibitors     REACTION: COUGH  . Amoxicillin-Pot Clavulanate     REACTION: diarrhea  . Amoxicillin-Pot Clavulanate Other (See Comments)    Pt unaware of reaction to augmentin  . Azithromycin Diarrhea  . Cyclobenzaprine Hcl     REACTION: diarrhea  . Flexeril (Cyclobenzaprine)   . Flonase (Fluticasone Propionate)     nosebleed  . Ibuprofen     REACTION: vomiting   Current Outpatient Prescriptions on File Prior to Visit  Medication Sig Dispense Refill  . ACCU-CHEK AVIVA PLUS test strip USE EVERY DAY TO CHECK GLUCOSE AND TO CHECK AS NEEDED  100 strip  2  . aspirin 81 MG tablet Take 81 mg by mouth daily.         Marland Kitchen dextromethorphan-guaiFENesin (MUCINEX DM) 30-600 MG per 12 hr tablet Take 1 tablet by mouth every 12 (twelve) hours as needed.      . diltiazem (CARDIZEM CD) 240 MG 24 hr capsule Take 1 capsule (240 mg total) by mouth daily.  90 capsule  3  . hydrochlorothiazide (MICROZIDE) 12.5 MG capsule Take 1 capsule (12.5 mg total) by mouth daily.  90 capsule  3  . ketoconazole (NIZORAL) 2 % cream Apply topically as needed.      Marland Kitchen ketoconazole (NIZORAL) 2 % shampoo Apply 1 application topically 2 (two) times a week.      . latanoprost (XALATAN) 0.005 % ophthalmic solution Place 1 drop into both eyes at bedtime.       Marland Kitchen loperamide (IMODIUM) 1 MG/5ML solution Take 1 mg by mouth 4 (four) times daily as needed.      . metFORMIN (GLUCOPHAGE) 500 MG tablet Take 1 tablet (500 mg total) by mouth 2 (two) times daily with a meal.  180 tablet  3  . omeprazole (PRILOSEC) 20 MG capsule Take 1 capsule (20 mg total) by mouth 2 (two) times daily.  180 capsule  3  . potassium chloride (KLOR-CON) 20 MEQ packet Take 20 mEq by mouth daily.      . potassium chloride SA (K-DUR,KLOR-CON) 20 MEQ tablet Take 1 tablet (20 mEq total) by mouth daily.  90 tablet  3  . pravastatin (PRAVACHOL) 40 MG tablet Take 1 tablet (40 mg total) by mouth every evening.  90 tablet  3  . PROAIR HFA 108 (90 BASE) MCG/ACT inhaler Ad lib.      . promethazine (PHENERGAN) 25 MG tablet Take 25 mg by mouth every 6 (six) hours as needed.      Marland Kitchen Propylene Glycol (SYSTANE BALANCE OP) Apply to eye.      Marland Kitchen UNKNOWN TO PATIENT 2-3 other topical agents (creams and powder) for yeast infections      . [DISCONTINUED] Calcium Carbonate-Vitamin D (CALCIUM-VITAMIN D) 500-200 MG-UNIT per tablet Take 1 tablet by mouth 2 (two) times daily with a meal.        . [DISCONTINUED] fexofenadine (ALLEGRA) 180 MG tablet Take 1 tablet (180 mg total) by mouth daily.  15 tablet  0   No current facility-administered medications on file prior to visit.      Review of  Systems Review of Systems  Constitutional: Negative for fever, appetite change, fatigue and unexpected weight change.  Eyes: Negative for pain and visual disturbance.  ENT pos  for mild ST, rhinorrhea/ sneezing, neg for ear or sinus pain Respiratory: Negative for cough and shortness of breath.   Cardiovascular: Negative for cp or palpitations    Gastrointestinal: Negative for nausea, diarrhea and constipation. neg for blood in stool or dark stool or rectal pain  Genitourinary: Negative for urgency and frequency.  Skin: Negative for pallor or rash   Neurological: Negative for weakness, light-headedness, numbness and headaches.  Hematological: Negative for adenopathy. Does not bruise/bleed easily.  Psychiatric/Behavioral: Negative for dysphoric mood. The patient is not nervous/anxious.         Objective:   Physical Exam  Constitutional: She appears well-developed and well-nourished. No distress.  overwt with poor hygeine  HENT:  Head: Normocephalic and atraumatic.  Right Ear: External ear normal.  Left Ear: External ear normal.  Mouth/Throat: Oropharynx is clear and moist. No oropharyngeal exudate.  Throat clear Nares are boggy / clear rhinorrhea   Eyes: Conjunctivae and EOM are normal. Pupils are equal, round, and reactive to light. Right eye exhibits no discharge. Left eye exhibits no discharge.  Neck: Normal range of motion. Neck supple. No JVD present.  Cardiovascular: Normal rate and regular rhythm.   Murmur heard. Pulmonary/Chest: Effort normal and breath sounds normal. No respiratory distress. She has no wheezes. She has no rales.  Abdominal: Soft. Bowel sounds are normal. She exhibits no distension and no mass. There is no tenderness. There is no rebound and no guarding.  Musculoskeletal: She exhibits no edema.  Lymphadenopathy:    She has no cervical adenopathy.  Neurological: She is alert. She has normal reflexes.  Skin: Skin is warm and dry. No rash noted. No erythema. No  pallor.  Psychiatric: She has a normal mood and affect.  Baseline mental retardation- but cheerful Repeats herself frequently          Assessment & Plan:

## 2012-04-19 NOTE — Assessment & Plan Note (Signed)
Neg rapid strep and exam expec this is early seasonal allergy rel with sneezing  Will gargle/ update if no imp

## 2012-04-19 NOTE — Assessment & Plan Note (Addendum)
Pt had a bout of constipation - verified by xray  Here for f/u of UC visit-notes and studies rev with pt in detail today Again disc IBS and the fluctuation between this and loose stools She got relief from 4 d of miralax- adv her to keep on hand and keep up good fluid intake and fiber as tolerated Symptoms are better and nl exam today

## 2012-04-23 ENCOUNTER — Ambulatory Visit (INDEPENDENT_AMBULATORY_CARE_PROVIDER_SITE_OTHER): Payer: Medicare Other | Admitting: Family Medicine

## 2012-04-23 ENCOUNTER — Encounter: Payer: Self-pay | Admitting: Family Medicine

## 2012-04-23 VITALS — BP 110/80 | HR 105 | Temp 97.8°F | Ht 62.0 in | Wt 197.5 lb

## 2012-04-23 DIAGNOSIS — L821 Other seborrheic keratosis: Secondary | ICD-10-CM

## 2012-04-23 DIAGNOSIS — J309 Allergic rhinitis, unspecified: Secondary | ICD-10-CM

## 2012-04-23 NOTE — Assessment & Plan Note (Signed)
2-3 mm area in lower R arm that is slt raised and flesh colored- appears dry Reassured her I think this is benign and adv to use a moisturizer on it for the dryness Will continue to watch

## 2012-04-23 NOTE — Assessment & Plan Note (Signed)
Seasonal and acting up with spring pollen  Told to re start her claritin- explained that this would help but not totally get rid of symptoms- and avoiding pollen is a good idea  Will update if fever or other symptoms

## 2012-04-23 NOTE — Progress Notes (Signed)
Subjective:    Patient ID: Linda Thompson, female    DOB: 02-08-1955, 57 y.o.   MRN: [id number]  HPI Here with post nasal drip and a bit of a sore throat  Also sneezes occasional  Not taking anythig for allergies  Also has a white spot/growth on her arm  Right lower arm Just came up    No fever No malaise  No congestion or cough or other symptoms   Patient Active Problem List  Diagnosis  . FUNGAL DERMATITIS  . MENINGIOMA  . DIABETES MELLITUS, TYPE II  . HYPERLIPIDEMIA  . MENTAL RETARDATION, MODERATE  . GLAUCOMA NOS  . HYPERTENSION  . ALLERGIC  RHINITIS  . GERD  . HIATAL HERNIA  . DERMATITIS, SEBORRHEIC NOS  . PRURITUS ANI  . OSTEOARTHRITIS, FOOT  . HEART MURMUR  . TRANSAMINASES, SERUM, ELEVATED  . Personal history of other musculoskeletal disorders  . IRRITABLE BOWEL SYNDROME  . Low back pain  . Candidal intertrigo  . Obesity  . Tinea pedis  . Chronic dermatitis of hands  . Adverse effects of medication  . ST (sore throat)   Past Medical History  Diagnosis Date  . Allergic rhinitis, cause unspecified   . Pain in joint, ankle and foot   . Backache, unspecified     chronic LBP with radiculopathy  . Seborrheic dermatitis, unspecified   . Type II or unspecified type diabetes mellitus without mention of complication, not stated as uncontrolled   . History of fracture of arm     Right  . History of fracture of foot   . Dermatomycosis, unspecified   . Esophageal reflux   . Unspecified glaucoma(365.9)   . Undiagnosed cardiac murmurs   . Diaphragmatic hernia without mention of obstruction or gangrene   . Other and unspecified hyperlipidemia   . Unspecified essential hypertension   . Irritable bowel syndrome   . Benign neoplasm of cerebral meninges   . Moderate intellectual disabilities   . Nausea alone   . Osteoarthrosis, unspecified whether generalized or localized, ankle and foot   . Pruritus ani   . Unspecified tinnitus   . Nonspecific elevation of  levels of transaminase or lactic acid dehydrogenase (LDH)   . Leukorrhea, not specified as infective   . Obesity   . Hyperglycemia   . DDD (degenerative disc disease), lumbar   . Yeast infection of the skin     Frequent   Past Surgical History  Procedure Laterality Date  . Total abdominal hysterectomy    . Cataract extraction    . Elbow surgery      right elbow   History  Substance Use Topics  . Smoking status: Never Smoker   . Smokeless tobacco: Never Used  . Alcohol Use: No   Family History  Problem Relation Age of Onset  . Diabetes Mother   . Diabetes Maternal Aunt   . Heart disease Maternal Aunt      (had pacemaker)  . Diabetes Maternal Grandmother   . Stroke Mother   . Stroke Father   . Stroke Maternal Grandmother    Allergies  Allergen Reactions  . Ace Inhibitors     REACTION: COUGH  . Amoxicillin-Pot Clavulanate     REACTION: diarrhea  . Amoxicillin-Pot Clavulanate Other (See Comments)    Pt unaware of reaction to augmentin  . Azithromycin Diarrhea  . Cyclobenzaprine Hcl     REACTION: diarrhea  . Flexeril (Cyclobenzaprine)   . Flonase (Fluticasone Propionate)     nosebleed  .  Ibuprofen     REACTION: vomiting   Current Outpatient Prescriptions on File Prior to Visit  Medication Sig Dispense Refill  . ACCU-CHEK AVIVA PLUS test strip USE EVERY DAY TO CHECK GLUCOSE AND TO CHECK AS NEEDED  100 strip  2  . aspirin 81 MG tablet Take 81 mg by mouth daily.        Marland Kitchen dextromethorphan-guaiFENesin (MUCINEX DM) 30-600 MG per 12 hr tablet Take 1 tablet by mouth every 12 (twelve) hours as needed.      . diltiazem (CARDIZEM CD) 240 MG 24 hr capsule Take 1 capsule (240 mg total) by mouth daily.  90 capsule  3  . hydrochlorothiazide (MICROZIDE) 12.5 MG capsule Take 1 capsule (12.5 mg total) by mouth daily.  90 capsule  3  . ketoconazole (NIZORAL) 2 % cream Apply topically as needed.      Marland Kitchen ketoconazole (NIZORAL) 2 % shampoo Apply 1 application topically 2 (two) times a  week.      . latanoprost (XALATAN) 0.005 % ophthalmic solution Place 1 drop into both eyes at bedtime.       Marland Kitchen loperamide (IMODIUM) 1 MG/5ML solution Take 1 mg by mouth 4 (four) times daily as needed.      . metFORMIN (GLUCOPHAGE) 500 MG tablet Take 1 tablet (500 mg total) by mouth 2 (two) times daily with a meal.  180 tablet  3  . omeprazole (PRILOSEC) 20 MG capsule Take 1 capsule (20 mg total) by mouth 2 (two) times daily.  180 capsule  3  . polyethylene glycol powder ([employer] PURELAX) powder Take 17 g by mouth as needed.      . potassium chloride (KLOR-CON) 20 MEQ packet Take 20 mEq by mouth daily.      . potassium chloride SA (K-DUR,KLOR-CON) 20 MEQ tablet Take 1 tablet (20 mEq total) by mouth daily.  90 tablet  3  . pravastatin (PRAVACHOL) 40 MG tablet Take 1 tablet (40 mg total) by mouth every evening.  90 tablet  3  . PROAIR HFA 108 (90 BASE) MCG/ACT inhaler Ad lib.      . promethazine (PHENERGAN) 25 MG tablet Take 25 mg by mouth every 6 (six) hours as needed.      Marland Kitchen Propylene Glycol (SYSTANE BALANCE OP) Apply to eye.      Marland Kitchen UNKNOWN TO PATIENT 2-3 other topical agents (creams and powder) for yeast infections      . [DISCONTINUED] Calcium Carbonate-Vitamin D (CALCIUM-VITAMIN D) 500-200 MG-UNIT per tablet Take 1 tablet by mouth 2 (two) times daily with a meal.        . [DISCONTINUED] fexofenadine (ALLEGRA) 180 MG tablet Take 1 tablet (180 mg total) by mouth daily.  15 tablet  0   No current facility-administered medications on file prior to visit.    Review of Systems Review of Systems  Constitutional: Negative for fever, appetite change, fatigue and unexpected weight change.  Eyes: Negative for pain and visual disturbance.  ENT pos for rhinorrhea and sneezing/ neg for ST or ear pain or sinus pain Respiratory: Negative for wheeze and shortness of breath.   Cardiovascular: Negative for cp or palpitations    Gastrointestinal: Negative for nausea, diarrhea and constipation.  Genitourinary:  Negative for urgency and frequency.  Skin: Negative for pallor or rash   Neurological: Negative for weakness, light-headedness, numbness and headaches.  Hematological: Negative for adenopathy. Does not bruise/bleed easily.  Psychiatric/Behavioral: Negative for dysphoric mood. The patient is not nervous/anxious.  Objective:   Physical Exam  Constitutional: She appears well-developed and well-nourished. No distress.  obese and well appearing   HENT:  Head: Normocephalic and atraumatic.  Right Ear: External ear normal.  Left Ear: External ear normal.  Mouth/Throat: Oropharynx is clear and moist. No oropharyngeal exudate.  Nares are boggy with mild clear rhinorrhea  No sinus tenderness   Eyes: Conjunctivae and EOM are normal. Pupils are equal, round, and reactive to light. Right eye exhibits no discharge. Left eye exhibits no discharge. No scleral icterus.  Neck: Normal range of motion. Neck supple.  Cardiovascular: Normal rate and regular rhythm.   Pulmonary/Chest: Effort normal and breath sounds normal. No respiratory distress. She has no wheezes. She has no rales.  Lymphadenopathy:    She has no cervical adenopathy.  Neurological: She is alert. She has normal reflexes.  Skin: Skin is warm and dry. No rash noted. No erythema. No pallor.  Small 2-3 mm raised SK on R forearm that is dry and pale in color   Psychiatric: She has a normal mood and affect.  Baseline MR          Assessment & Plan:

## 2012-04-23 NOTE — Patient Instructions (Addendum)
I think your allergies are acting up - causing the post nasal drip (drainage) and also sore throat and sneezing  Get claritin over the counter (I think you have taken that before) and take 1 pill daily  This will help symptoms but not totally get rid of them  If you worsen or develop fever please let me know  The spot on your arm looks ok - use a moisturizer on it and we will watch it

## 2012-05-03 ENCOUNTER — Encounter: Payer: Self-pay | Admitting: Family Medicine

## 2012-05-03 ENCOUNTER — Ambulatory Visit (INDEPENDENT_AMBULATORY_CARE_PROVIDER_SITE_OTHER): Payer: Medicare Other | Admitting: Family Medicine

## 2012-05-03 VITALS — BP 140/86 | HR 102 | Temp 98.1°F | Ht 62.0 in | Wt 199.0 lb

## 2012-05-03 DIAGNOSIS — L290 Pruritus ani: Secondary | ICD-10-CM

## 2012-05-03 NOTE — Progress Notes (Signed)
Subjective:    Patient ID: Linda Thompson, female    DOB: 1955/11/24, 57 y.o.   MRN: [id number]  HPI Here with recurrent anal irritation / pruritis ani   Here with increased itching over the past week  Neighbor told her to use vaseline - she tried it over the weekend   She has not used to atapro anti itch cream or ketoconazole cream   Again- wants to know if she can use antidiarrheal prn for traveling/ church trips   No rash in other areas at this time  Patient Active Problem List  Diagnosis  . FUNGAL DERMATITIS  . MENINGIOMA  . DIABETES MELLITUS, TYPE II  . HYPERLIPIDEMIA  . MENTAL RETARDATION, MODERATE  . GLAUCOMA NOS  . HYPERTENSION  . ALLERGIC  RHINITIS  . GERD  . HIATAL HERNIA  . DERMATITIS, SEBORRHEIC NOS  . PRURITUS ANI  . OSTEOARTHRITIS, FOOT  . HEART MURMUR  . TRANSAMINASES, SERUM, ELEVATED  . Personal history of other musculoskeletal disorders  . IRRITABLE BOWEL SYNDROME  . Low back pain  . Candidal intertrigo  . Obesity  . Tinea pedis  . Chronic dermatitis of hands  . Adverse effects of medication  . ST (sore throat)  . Seborrheic keratosis   Past Medical History  Diagnosis Date  . Allergic rhinitis, cause unspecified   . Pain in joint, ankle and foot   . Backache, unspecified     chronic LBP with radiculopathy  . Seborrheic dermatitis, unspecified   . Type II or unspecified type diabetes mellitus without mention of complication, not stated as uncontrolled   . History of fracture of arm     Right  . History of fracture of foot   . Dermatomycosis, unspecified   . Esophageal reflux   . Unspecified glaucoma(365.9)   . Undiagnosed cardiac murmurs   . Diaphragmatic hernia without mention of obstruction or gangrene   . Other and unspecified hyperlipidemia   . Unspecified essential hypertension   . Irritable bowel syndrome   . Benign neoplasm of cerebral meninges   . Moderate intellectual disabilities   . Nausea alone   . Osteoarthrosis,  unspecified whether generalized or localized, ankle and foot   . Pruritus ani   . Unspecified tinnitus   . Nonspecific elevation of levels of transaminase or lactic acid dehydrogenase (LDH)   . Leukorrhea, not specified as infective   . Obesity   . Hyperglycemia   . DDD (degenerative disc disease), lumbar   . Yeast infection of the skin     Frequent   Past Surgical History  Procedure Laterality Date  . Total abdominal hysterectomy    . Cataract extraction    . Elbow surgery      right elbow   History  Substance Use Topics  . Smoking status: Never Smoker   . Smokeless tobacco: Never Used  . Alcohol Use: No   Family History  Problem Relation Age of Onset  . Diabetes Mother   . Diabetes Maternal Aunt   . Heart disease Maternal Aunt      (had pacemaker)  . Diabetes Maternal Grandmother   . Stroke Mother   . Stroke Father   . Stroke Maternal Grandmother    Allergies  Allergen Reactions  . Ace Inhibitors     REACTION: COUGH  . Amoxicillin-Pot Clavulanate     REACTION: diarrhea  . Amoxicillin-Pot Clavulanate Other (See Comments)    Pt unaware of reaction to augmentin  . Azithromycin Diarrhea  . Cyclobenzaprine  Hcl     REACTION: diarrhea  . Flexeril (Cyclobenzaprine)   . Flonase (Fluticasone Propionate)     nosebleed  . Ibuprofen     REACTION: vomiting   Current Outpatient Prescriptions on File Prior to Visit  Medication Sig Dispense Refill  . ACCU-CHEK AVIVA PLUS test strip USE EVERY DAY TO CHECK GLUCOSE AND TO CHECK AS NEEDED  100 strip  2  . aspirin 81 MG tablet Take 81 mg by mouth daily.        Marland Kitchen dextromethorphan-guaiFENesin (MUCINEX DM) 30-600 MG per 12 hr tablet Take 1 tablet by mouth every 12 (twelve) hours as needed.      . diltiazem (CARDIZEM CD) 240 MG 24 hr capsule Take 1 capsule (240 mg total) by mouth daily.  90 capsule  3  . hydrochlorothiazide (MICROZIDE) 12.5 MG capsule Take 1 capsule (12.5 mg total) by mouth daily.  90 capsule  3  . ketoconazole  (NIZORAL) 2 % cream Apply topically as needed.      Marland Kitchen ketoconazole (NIZORAL) 2 % shampoo Apply 1 application topically 2 (two) times a week.      . latanoprost (XALATAN) 0.005 % ophthalmic solution Place 1 drop into both eyes at bedtime.       Marland Kitchen loperamide (IMODIUM) 1 MG/5ML solution Take 1 mg by mouth 4 (four) times daily as needed.      . metFORMIN (GLUCOPHAGE) 500 MG tablet Take 1 tablet (500 mg total) by mouth 2 (two) times daily with a meal.  180 tablet  3  . omeprazole (PRILOSEC) 20 MG capsule Take 1 capsule (20 mg total) by mouth 2 (two) times daily.  180 capsule  3  . polyethylene glycol powder ([employer] PURELAX) powder Take 17 g by mouth as needed.      . potassium chloride (KLOR-CON) 20 MEQ packet Take 20 mEq by mouth daily.      . potassium chloride SA (K-DUR,KLOR-CON) 20 MEQ tablet Take 1 tablet (20 mEq total) by mouth daily.  90 tablet  3  . pravastatin (PRAVACHOL) 40 MG tablet Take 1 tablet (40 mg total) by mouth every evening.  90 tablet  3  . PROAIR HFA 108 (90 BASE) MCG/ACT inhaler Ad lib.      . promethazine (PHENERGAN) 25 MG tablet Take 25 mg by mouth every 6 (six) hours as needed.      Marland Kitchen Propylene Glycol (SYSTANE BALANCE OP) Apply to eye.      Marland Kitchen UNKNOWN TO PATIENT 2-3 other topical agents (creams and powder) for yeast infections      . [DISCONTINUED] Calcium Carbonate-Vitamin D (CALCIUM-VITAMIN D) 500-200 MG-UNIT per tablet Take 1 tablet by mouth 2 (two) times daily with a meal.        . [DISCONTINUED] fexofenadine (ALLEGRA) 180 MG tablet Take 1 tablet (180 mg total) by mouth daily.  15 tablet  0   No current facility-administered medications on file prior to visit.    Review of Systems Review of Systems  Constitutional: Negative for fever, appetite change, fatigue and unexpected weight change.  Eyes: Negative for pain and visual disturbance.  Respiratory: Negative for cough and shortness of breath.   Cardiovascular: Negative for cp or palpitations    Gastrointestinal:  Negative for nausea, or abd pain and pos for occ diarrhea from IBS. neg for blood in stool Genitourinary: Negative for urgency and frequency.  Skin: Negative for pallor or rash  pos for anal itching that is recurrent/ neg for hemorrhoids or bleeding  Neurological:  Negative for weakness, light-headedness, numbness and headaches.  Hematological: Negative for adenopathy. Does not bruise/bleed easily.  Psychiatric/Behavioral: Negative for dysphoric mood. The patient is not nervous/anxious.         Objective:   Physical Exam  Constitutional: She appears well-developed and well-nourished. No distress.  obese and well appearing   HENT:  Head: Normocephalic and atraumatic.  Eyes: Conjunctivae and EOM are normal. Pupils are equal, round, and reactive to light. Right eye exhibits no discharge. Left eye exhibits no discharge.  Neck: Normal range of motion. Neck supple.  Cardiovascular: Normal rate and regular rhythm.   Pulmonary/Chest: Effort normal and breath sounds normal.  Abdominal: Soft. Bowel sounds are normal. She exhibits no distension. There is no tenderness.  Lymphadenopathy:    She has no cervical adenopathy.  Neurological: She is alert.  Skin: Skin is warm and dry. No rash noted. There is erythema. No pallor.  Scant erythema -no broken areas/ hemorrhoids/bleeding or satellite lesions- in anal area  No excoriations  Psychiatric: She has a normal mood and affect.  Baseline MR- repeats herself frequently          Assessment & Plan:

## 2012-05-03 NOTE — Assessment & Plan Note (Signed)
Recurrent possibly due to hygiene issues and also frequent wiping / intertrigo  Recommend cleansing after bms with thorough drying and applic of ketoconazole daily, use of anti itch gel prn This is plan from dermatologist that has worked in the past  Update if not starting to improve in a week or if worsening

## 2012-05-03 NOTE — Patient Instructions (Addendum)
For itching and irritation in the anal area  Keep clean with baby wipe after bowel movement and then dry area very well (also dry well after bathing) Use the atapro anti itch gel from the dermatologist -up to once daily as needed for itching  Use the ketoconazole cream once daily until symptoms resolved  Take the anti diarrhea medicine only if needed for trips out

## 2012-05-04 ENCOUNTER — Telehealth: Payer: Self-pay

## 2012-05-04 NOTE — Telephone Encounter (Signed)
Pt notified its ok to take med, pt verbalized understanding

## 2012-05-04 NOTE — Telephone Encounter (Signed)
Ok to take a dose of diarrhea medicine today - let her know I said that is fine Let me know if symptoms worsen

## 2012-05-04 NOTE — Telephone Encounter (Signed)
Pt started this morning with loose watery BM; pt ate some food with a lot of spices and pt said has IBS. No abd pain and no fever.Pt wants to know if OK to use anti diarrhea med since has diarrhea this morning. Pt has had loose BM x 1 this morning. Advised pt OK to take Loperamide for diarrhea only. Pt wants Dr Royden Purl opinion also.Please advise.pt request call back today. Pt has dental appt 05/05/12 and will not be home.

## 2012-05-05 NOTE — Telephone Encounter (Signed)
Pt notified to hold off on med, and take it the morning of her mammogram appt, and keep [place] posted, pt verbalized understanding

## 2012-05-05 NOTE — Telephone Encounter (Signed)
Hold off on it today  Can take the day of your mammogram Keep [place] posted

## 2012-05-05 NOTE — Telephone Encounter (Signed)
Pt had loose BM x 1 this morning(not watery) but did not make it to bathroom before had BM; pt said Dr Milinda Antis told her not to take Loperamide every day. Pt wants to know if should take Loperamide today and pt has mammogram scheduled 05/07/12 can pt take prior to leaving home for mammogram.Please advise.

## 2012-05-06 ENCOUNTER — Telehealth: Payer: Self-pay

## 2012-05-06 NOTE — Telephone Encounter (Signed)
Pt does not have diarrhea today; pt feeling weak due to neighbor telling pt to only eat jello, grits or toast. Advised pt if no diarrhea can eat other foods as long as not greasy or spicy. Pt voiced understanding.

## 2012-05-06 NOTE — Telephone Encounter (Signed)
Agree with that advisement  

## 2012-05-06 NOTE — Telephone Encounter (Signed)
Pt came by office to find out Dr Royden Purl response; Patient notified as instructed. Pt said one time since she called she coughed several times in a row and then felt nauseated for short period. Pt does not feel nauseated now and has not had diarrhea today. Pt will call back if needed.

## 2012-05-07 DIAGNOSIS — Z1231 Encounter for screening mammogram for malignant neoplasm of breast: Secondary | ICD-10-CM | POA: Diagnosis not present

## 2012-05-10 ENCOUNTER — Encounter: Payer: Self-pay | Admitting: Family Medicine

## 2012-05-11 ENCOUNTER — Encounter: Payer: Self-pay | Admitting: *Deleted

## 2012-05-17 ENCOUNTER — Encounter: Payer: Self-pay | Admitting: Family Medicine

## 2012-05-17 ENCOUNTER — Ambulatory Visit (INDEPENDENT_AMBULATORY_CARE_PROVIDER_SITE_OTHER): Payer: Medicare Other | Admitting: Family Medicine

## 2012-05-17 ENCOUNTER — Telehealth: Payer: Self-pay | Admitting: Family Medicine

## 2012-05-17 VITALS — BP 132/72 | HR 96 | Temp 98.3°F | Ht 62.0 in | Wt 199.8 lb

## 2012-05-17 DIAGNOSIS — L290 Pruritus ani: Secondary | ICD-10-CM | POA: Diagnosis not present

## 2012-05-17 NOTE — Telephone Encounter (Signed)
[facility] Triage Call Report Triage Record Num: [id number] Operator: Ether Griffins Patient Name: Linda Thompson Call Date & Time: 05/15/2012 5:14:50PM Patient Phone: [phone] PCP: Audrie Gallus. Tower Patient Gender: Female PCP Fax : Patient DOB: July 15, 1955 Practice Name: [facility] Reason for Call: Caller: Prapti/Patient; PCP: Roxy Manns [facility]); CB#: [phone]; Calling about rectal itching not any better. Onset 05/15/12. Saw PCP 2 weeks ago for rectal itching--dx with yeast infection and advised to use baby wipes, and gave Ketoconazole but not helping. Guideline: Rectal Symptoms. Disposition: See Provider Within 72 Hours. Reason for Disposition: Severe rectal itching not relieved with homecare. Advised to call office on Monday to schedule an appt to be seen within the next few days. Protocol(s) Used: Rectal Symptoms Recommended Outcome per Protocol: See Provider within 72 Hours Reason for Outcome: Severe rectal itching not relieved with homecare Care Advice: ~ Call provider if symptoms worsen or new symptoms develop. ~ SYMPTOM / CONDITION MANAGEMENT ~ CAUTIONS Gently clean rectal area after bowel movement using unscented moist flushable wipes, moistened toilet tissue, or cotton balls soaked in witch hazel rather than dry, perfumed or colored toilet tissue. Avoid use of any wipes containing alcohol. ~ 05/15/2012 5:31:07PM Page 1 of 1 [id number]

## 2012-05-17 NOTE — Telephone Encounter (Signed)
Pt was seen

## 2012-05-17 NOTE — Progress Notes (Signed)
Subjective:    Patient ID: Linda Thompson, female    DOB: 12-01-1955, 57 y.o.   MRN: [id number]  HPI Having more problems with anal itching Using ketoconazole cream and the atapro  Got better for a while and then became worse  She is worried about hemorrhoid- she spoke to the nurse on call about that   No blood in stool  Not straining   Patient Active Problem List  Diagnosis  . FUNGAL DERMATITIS  . MENINGIOMA  . DIABETES MELLITUS, TYPE II  . HYPERLIPIDEMIA  . MENTAL RETARDATION, MODERATE  . GLAUCOMA NOS  . HYPERTENSION  . ALLERGIC  RHINITIS  . GERD  . HIATAL HERNIA  . DERMATITIS, SEBORRHEIC NOS  . PRURITUS ANI  . OSTEOARTHRITIS, FOOT  . HEART MURMUR  . TRANSAMINASES, SERUM, ELEVATED  . Personal history of other musculoskeletal disorders  . IRRITABLE BOWEL SYNDROME  . Low back pain  . Candidal intertrigo  . Obesity  . Tinea pedis  . Chronic dermatitis of hands  . Adverse effects of medication  . Seborrheic keratosis   Past Medical History  Diagnosis Date  . Allergic rhinitis, cause unspecified   . Pain in joint, ankle and foot   . Backache, unspecified     chronic LBP with radiculopathy  . Seborrheic dermatitis, unspecified   . Type II or unspecified type diabetes mellitus without mention of complication, not stated as uncontrolled   . History of fracture of arm     Right  . History of fracture of foot   . Dermatomycosis, unspecified   . Esophageal reflux   . Unspecified glaucoma(365.9)   . Undiagnosed cardiac murmurs   . Diaphragmatic hernia without mention of obstruction or gangrene   . Other and unspecified hyperlipidemia   . Unspecified essential hypertension   . Irritable bowel syndrome   . Benign neoplasm of cerebral meninges   . Moderate intellectual disabilities   . Nausea alone   . Osteoarthrosis, unspecified whether generalized or localized, ankle and foot   . Pruritus ani   . Unspecified tinnitus   . Nonspecific elevation of levels of  transaminase or lactic acid dehydrogenase (LDH)   . Leukorrhea, not specified as infective   . Obesity   . Hyperglycemia   . DDD (degenerative disc disease), lumbar   . Yeast infection of the skin     Frequent   Past Surgical History  Procedure Laterality Date  . Total abdominal hysterectomy    . Cataract extraction    . Elbow surgery      right elbow   History  Substance Use Topics  . Smoking status: Never Smoker   . Smokeless tobacco: Never Used  . Alcohol Use: No   Family History  Problem Relation Age of Onset  . Diabetes Mother   . Diabetes Maternal Aunt   . Heart disease Maternal Aunt      (had pacemaker)  . Diabetes Maternal Grandmother   . Stroke Mother   . Stroke Father   . Stroke Maternal Grandmother    Allergies  Allergen Reactions  . Ace Inhibitors     REACTION: COUGH  . Amoxicillin-Pot Clavulanate     REACTION: diarrhea  . Amoxicillin-Pot Clavulanate Other (See Comments)    Pt unaware of reaction to augmentin  . Azithromycin Diarrhea  . Cyclobenzaprine Hcl     REACTION: diarrhea  . Flexeril (Cyclobenzaprine)   . Flonase (Fluticasone Propionate)     nosebleed  . Ibuprofen     REACTION:  vomiting   Current Outpatient Prescriptions on File Prior to Visit  Medication Sig Dispense Refill  . ACCU-CHEK AVIVA PLUS test strip USE EVERY DAY TO CHECK GLUCOSE AND TO CHECK AS NEEDED  100 strip  2  . aspirin 81 MG tablet Take 81 mg by mouth daily.        Marland Kitchen dextromethorphan-guaiFENesin (MUCINEX DM) 30-600 MG per 12 hr tablet Take 1 tablet by mouth every 12 (twelve) hours as needed.      . diltiazem (CARDIZEM CD) 240 MG 24 hr capsule Take 1 capsule (240 mg total) by mouth daily.  90 capsule  3  . hydrochlorothiazide (MICROZIDE) 12.5 MG capsule Take 1 capsule (12.5 mg total) by mouth daily.  90 capsule  3  . ketoconazole (NIZORAL) 2 % cream Apply topically as needed.      Marland Kitchen ketoconazole (NIZORAL) 2 % shampoo Apply 1 application topically 2 (two) times a week.       . latanoprost (XALATAN) 0.005 % ophthalmic solution Place 1 drop into both eyes at bedtime.       Marland Kitchen loperamide (IMODIUM) 1 MG/5ML solution Take 1 mg by mouth 4 (four) times daily as needed.      . metFORMIN (GLUCOPHAGE) 500 MG tablet Take 1 tablet (500 mg total) by mouth 2 (two) times daily with a meal.  180 tablet  3  . omeprazole (PRILOSEC) 20 MG capsule Take 1 capsule (20 mg total) by mouth 2 (two) times daily.  180 capsule  3  . polyethylene glycol powder ([employer] PURELAX) powder Take 17 g by mouth as needed.      . potassium chloride (KLOR-CON) 20 MEQ packet Take 20 mEq by mouth daily.      . potassium chloride SA (K-DUR,KLOR-CON) 20 MEQ tablet Take 1 tablet (20 mEq total) by mouth daily.  90 tablet  3  . pravastatin (PRAVACHOL) 40 MG tablet Take 1 tablet (40 mg total) by mouth every evening.  90 tablet  3  . PROAIR HFA 108 (90 BASE) MCG/ACT inhaler Ad lib.      . promethazine (PHENERGAN) 25 MG tablet Take 25 mg by mouth every 6 (six) hours as needed.      Marland Kitchen Propylene Glycol (SYSTANE BALANCE OP) Apply to eye.      Marland Kitchen UNKNOWN TO PATIENT 2-3 other topical agents (creams and powder) for yeast infections      . [DISCONTINUED] Calcium Carbonate-Vitamin D (CALCIUM-VITAMIN D) 500-200 MG-UNIT per tablet Take 1 tablet by mouth 2 (two) times daily with a meal.        . [DISCONTINUED] fexofenadine (ALLEGRA) 180 MG tablet Take 1 tablet (180 mg total) by mouth daily.  15 tablet  0   No current facility-administered medications on file prior to visit.    Review of Systems Review of Systems  Constitutional: Negative for fever, appetite change, fatigue and unexpected weight change.  Eyes: Negative for pain and visual disturbance.  Respiratory: Negative for cough and shortness of breath.   Cardiovascular: Negative for cp or palpitations    Gastrointestinal: Negative for nausea, diarrhea and constipation. neg for blood in stool / dark stool or rectal pain  Genitourinary: Negative for urgency and frequency.   Skin: Negative for pallor or rash  pos for itching in anal area , neg for bleeding  Neurological: Negative for weakness, light-headedness, numbness and headaches.  Hematological: Negative for adenopathy. Does not bruise/bleed easily.  Psychiatric/Behavioral: Negative for dysphoric mood. The patient is not nervous/anxious.  Objective:   Physical Exam  Constitutional: She appears well-developed and well-nourished. No distress.  obese and well appearing   HENT:  Head: Normocephalic and atraumatic.  Eyes: Conjunctivae and EOM are normal. Pupils are equal, round, and reactive to light. Right eye exhibits no discharge. Left eye exhibits no discharge.  Cardiovascular: Normal rate and regular rhythm.   Pulmonary/Chest: Effort normal and breath sounds normal.  Abdominal: Soft. Bowel sounds are normal. She exhibits no distension. There is no tenderness.  Genitourinary: Rectum normal. Rectal exam shows no external hemorrhoid, no internal hemorrhoid, no fissure, no mass, no tenderness and anal tone normal.  Skin: Skin is warm and dry. No rash noted. There is erythema. No pallor.  Scant erythema of anal area   Psychiatric: She has a normal mood and affect.  Baseline MR Repeats herself frequently Mood is good          Assessment & Plan:

## 2012-05-17 NOTE — Assessment & Plan Note (Signed)
This continues- fairly normal exam today  Rectal exam normal also -no hemorrhoids noted or bleeding Adv to continue good cleansing and use of ketoconazole and also atapro gel

## 2012-05-17 NOTE — Patient Instructions (Addendum)
You itching is not from hemorrhoids  Keep up with careful cleansing and drying - use the ketoconazole cream and the atapro gel as needed  If this does not work- we will make you a follow up with dermatology

## 2012-05-19 ENCOUNTER — Ambulatory Visit (INDEPENDENT_AMBULATORY_CARE_PROVIDER_SITE_OTHER): Payer: Medicare Other | Admitting: Family Medicine

## 2012-05-19 ENCOUNTER — Encounter: Payer: Self-pay | Admitting: Family Medicine

## 2012-05-19 ENCOUNTER — Ambulatory Visit (INDEPENDENT_AMBULATORY_CARE_PROVIDER_SITE_OTHER)
Admission: RE | Admit: 2012-05-19 | Discharge: 2012-05-19 | Disposition: A | Discharge status: Home / Self Care | Payer: Medicare Other | Source: Ambulatory Visit | Attending: Family Medicine | Admitting: Family Medicine

## 2012-05-19 VITALS — BP 128/88 | HR 106 | Temp 98.0°F | Ht 62.0 in | Wt 198.0 lb

## 2012-05-19 DIAGNOSIS — M25571 Pain in right ankle and joints of right foot: Secondary | ICD-10-CM

## 2012-05-19 DIAGNOSIS — M79671 Pain in right foot: Secondary | ICD-10-CM

## 2012-05-19 DIAGNOSIS — M25579 Pain in unspecified ankle and joints of unspecified foot: Secondary | ICD-10-CM | POA: Diagnosis not present

## 2012-05-19 DIAGNOSIS — M79609 Pain in unspecified limb: Secondary | ICD-10-CM

## 2012-05-19 DIAGNOSIS — M19079 Primary osteoarthritis, unspecified ankle and foot: Secondary | ICD-10-CM | POA: Diagnosis not present

## 2012-05-19 NOTE — Patient Instructions (Addendum)
xrays of ankle and foot today  Then we will call you tomorrow with the result  Elevate your foot when you can  You can use a cold compress on the painful area for 10 minutes at a time

## 2012-05-19 NOTE — Assessment & Plan Note (Signed)
For the past week -pain on anterior foot and ankle when she walks or dorsiflexes No swelling or other finding Xray now inst to elevate foot and use cold compress 10 min on / 10 min off  Will update

## 2012-05-19 NOTE — Progress Notes (Signed)
Subjective:    Patient ID: Linda Thompson, female    DOB: [birth year], 57 y.o.   MRN: [id number]  HPI Here for R foot pain  Going on worse than usual last few days  When she walks on it - she limps and cannot put full wt on it -wonders if arthritis  Hurts to dorsiflex/ put pressure on it  Points to painful area as top of the foot and anterior ankle  No swelling or redness No trauma   Patient Active Problem List   Diagnosis Date Noted  . Seborrheic keratosis 04/23/2012  . Adverse effects of medication 12/17/2011  . Chronic dermatitis of hands 09/12/2011  . Tinea pedis 07/23/2011  . Obesity 05/05/2011  . Candidal intertrigo 11/15/2010  . Low back pain 09/13/2010  . IRRITABLE BOWEL SYNDROME 03/05/2010  . HEART MURMUR 10/12/2009  . TRANSAMINASES, SERUM, ELEVATED 11/22/2007  . DIABETES MELLITUS, TYPE II 08/13/2007  . ALLERGIC  RHINITIS 09/23/2006  . FUNGAL DERMATITIS 08/26/2006  . DERMATITIS, SEBORRHEIC NOS 08/26/2006  . HYPERLIPIDEMIA 03/11/2006  . MENTAL RETARDATION, MODERATE 03/11/2006  . GLAUCOMA NOS 03/11/2006  . HYPERTENSION 03/11/2006  . GERD 03/11/2006  . HIATAL HERNIA 03/11/2006  . PRURITUS ANI 03/11/2006  . OSTEOARTHRITIS, FOOT 03/11/2006  . Personal history of other musculoskeletal disorders 03/11/2006  . MENINGIOMA 12/20/2001   Past Medical History  Diagnosis Date  . Allergic rhinitis, cause unspecified   . Pain in joint, ankle and foot   . Backache, unspecified     chronic LBP with radiculopathy  . Seborrheic dermatitis, unspecified   . Type II or unspecified type diabetes mellitus without mention of complication, not stated as uncontrolled   . History of fracture of arm     Right  . History of fracture of foot   . Dermatomycosis, unspecified   . Esophageal reflux   . Unspecified glaucoma(365.9)   . Undiagnosed cardiac murmurs   . Diaphragmatic hernia without mention of obstruction or gangrene   . Other and unspecified hyperlipidemia   . Unspecified  essential hypertension   . Irritable bowel syndrome   . Benign neoplasm of cerebral meninges   . Moderate intellectual disabilities   . Nausea alone   . Osteoarthrosis, unspecified whether generalized or localized, ankle and foot   . Pruritus ani   . Unspecified tinnitus   . Nonspecific elevation of levels of transaminase or lactic acid dehydrogenase (LDH)   . Leukorrhea, not specified as infective   . Obesity   . Hyperglycemia   . DDD (degenerative disc disease), lumbar   . Yeast infection of the skin     Frequent   Past Surgical History  Procedure Laterality Date  . Total abdominal hysterectomy    . Cataract extraction    . Elbow surgery      right elbow   History  Substance Use Topics  . Smoking status: Never Smoker   . Smokeless tobacco: Never Used  . Alcohol Use: No   Family History  Problem Relation Age of Onset  . Diabetes Mother   . Diabetes Maternal Aunt   . Heart disease Maternal Aunt      (had pacemaker)  . Diabetes Maternal Grandmother   . Stroke Mother   . Stroke Father   . Stroke Maternal Grandmother    Allergies  Allergen Reactions  . Ace Inhibitors     REACTION: COUGH  . Amoxicillin-Pot Clavulanate     REACTION: diarrhea  . Amoxicillin-Pot Clavulanate Other (See Comments)    Pt  unaware of reaction to augmentin  . Azithromycin Diarrhea  . Cyclobenzaprine Hcl     REACTION: diarrhea  . Flexeril (Cyclobenzaprine)   . Flonase (Fluticasone Propionate)     nosebleed  . Ibuprofen     REACTION: vomiting   Current Outpatient Prescriptions on File Prior to Visit  Medication Sig Dispense Refill  . ACCU-CHEK AVIVA PLUS test strip USE EVERY DAY TO CHECK GLUCOSE AND TO CHECK AS NEEDED  100 strip  2  . aspirin 81 MG tablet Take 81 mg by mouth daily.        Marland Kitchen diltiazem (CARDIZEM CD) 240 MG 24 hr capsule Take 1 capsule (240 mg total) by mouth daily.  90 capsule  3  . hydrochlorothiazide (MICROZIDE) 12.5 MG capsule Take 1 capsule (12.5 mg total) by mouth  daily.  90 capsule  3  . ketoconazole (NIZORAL) 2 % cream Apply topically as needed.      Marland Kitchen ketoconazole (NIZORAL) 2 % shampoo Apply 1 application topically 2 (two) times a week.      . latanoprost (XALATAN) 0.005 % ophthalmic solution Place 1 drop into both eyes at bedtime.       Marland Kitchen loperamide (IMODIUM) 1 MG/5ML solution Take 1 mg by mouth 4 (four) times daily as needed.      . metFORMIN (GLUCOPHAGE) 500 MG tablet Take 1 tablet (500 mg total) by mouth 2 (two) times daily with a meal.  180 tablet  3  . nystatin (MYCOSTATIN/NYSTOP) 100000 UNIT/GM POWD as needed.      Marland Kitchen omeprazole (PRILOSEC) 20 MG capsule Take 1 capsule (20 mg total) by mouth 2 (two) times daily.  180 capsule  3  . polyethylene glycol powder ([employer] PURELAX) powder Take 17 g by mouth as needed.      . potassium chloride SA (K-DUR,KLOR-CON) 20 MEQ tablet Take 1 tablet (20 mEq total) by mouth daily.  90 tablet  3  . pravastatin (PRAVACHOL) 40 MG tablet Take 1 tablet (40 mg total) by mouth every evening.  90 tablet  3  . PROAIR HFA 108 (90 BASE) MCG/ACT inhaler as directed.       . promethazine (PHENERGAN) 25 MG tablet Take 25 mg by mouth every 6 (six) hours as needed.      Marland Kitchen Propylene Glycol (SYSTANE BALANCE OP) Apply to eye.      . [DISCONTINUED] Calcium Carbonate-Vitamin D (CALCIUM-VITAMIN D) 500-200 MG-UNIT per tablet Take 1 tablet by mouth 2 (two) times daily with a meal.        . [DISCONTINUED] fexofenadine (ALLEGRA) 180 MG tablet Take 1 tablet (180 mg total) by mouth daily.  15 tablet  0   No current facility-administered medications on file prior to visit.    Review of Systems Review of Systems  Constitutional: Negative for fever, appetite change, fatigue and unexpected weight change.  Eyes: Negative for pain and visual disturbance.  Respiratory: Negative for cough and shortness of breath.   Cardiovascular: Negative for cp or palpitations    Gastrointestinal: Negative for nausea, diarrhea and constipation.  Genitourinary:  Negative for urgency and frequency.  Skin: Negative for pallor or rash  pos for intermittent rectal itching  MSK pos for R foot and ankle pain without swelling, neg for other joint c/o Neurological: Negative for weakness, light-headedness, numbness and headaches.  Hematological: Negative for adenopathy. Does not bruise/bleed easily.  Psychiatric/Behavioral: Negative for dysphoric mood. The patient is not nervous/anxious.         Objective:   Physical Exam  Constitutional: She appears well-developed and well-nourished. No distress.  obese and well appearing   Neck: Normal range of motion. Neck supple.  Cardiovascular: Normal rate and regular rhythm.   Musculoskeletal: She exhibits tenderness. She exhibits no edema.       Right ankle: She exhibits normal range of motion, no swelling, no ecchymosis, no deformity and normal pulse. Tenderness. Achilles tendon normal.       Right foot: She exhibits tenderness. She exhibits normal range of motion, no bony tenderness, no swelling, normal capillary refill, no crepitus and no deformity.  Tender over anterior ankle and proximal metatarsals Pain to dorsiflex foot actively and passively- but no limitation in rom  Also pain to invert ankle  Neurological: She is alert. She has normal reflexes.  Skin: Skin is warm and dry. No rash noted.  Psychiatric: She has a normal mood and affect.  Baseline MR -repeats herself frequently          Assessment & Plan:

## 2012-05-20 NOTE — Assessment & Plan Note (Signed)
With R foot pain - see eval Xray and adv

## 2012-06-04 ENCOUNTER — Encounter: Payer: Self-pay | Admitting: Family Medicine

## 2012-06-04 ENCOUNTER — Ambulatory Visit (INDEPENDENT_AMBULATORY_CARE_PROVIDER_SITE_OTHER): Payer: Medicare Other | Admitting: Family Medicine

## 2012-06-04 VITALS — BP 126/82 | HR 90 | Temp 97.9°F | Ht 62.0 in | Wt 197.2 lb

## 2012-06-04 DIAGNOSIS — L290 Pruritus ani: Secondary | ICD-10-CM | POA: Diagnosis not present

## 2012-06-04 DIAGNOSIS — E119 Type 2 diabetes mellitus without complications: Secondary | ICD-10-CM

## 2012-06-04 DIAGNOSIS — I10 Essential (primary) hypertension: Secondary | ICD-10-CM | POA: Diagnosis not present

## 2012-06-04 DIAGNOSIS — E785 Hyperlipidemia, unspecified: Secondary | ICD-10-CM

## 2012-06-04 LAB — COMPREHENSIVE METABOLIC PANEL
CO2: 28 mEq/L (ref 19–32)
Creatinine, Ser: 0.6 mg/dL (ref 0.4–1.2)
GFR: 109.19 mL/min (ref >60.00)
Glucose, Bld: 67 mg/dL — ABNORMAL LOW (ref 70–99)
Total Bilirubin: 0.3 mg/dL (ref 0.3–1.2)

## 2012-06-04 LAB — CBC WITH DIFFERENTIAL/PLATELET
Basophils Absolute: 0 10*3/uL (ref 0.0–0.1)
HCT: 39.5 % (ref 36.0–46.0)
Hemoglobin: 12.4 g/dL (ref 12.0–15.0)
Lymphs Abs: 2.4 10*3/uL (ref 0.7–4.0)
MCV: 76.3 fl — ABNORMAL LOW (ref 78.0–100.0)
Monocytes Relative: 9.2 % (ref 3.0–12.0)
Neutro Abs: 5.3 10*3/uL (ref 1.4–7.7)
RDW: 15.7 % — ABNORMAL HIGH (ref 11.5–14.6)

## 2012-06-04 LAB — LIPID PANEL
Cholesterol: 155 mg/dL (ref 0–200)
Triglycerides: 256 mg/dL — ABNORMAL HIGH (ref 0.0–149.0)

## 2012-06-04 LAB — TSH: TSH: 2.25 u[IU]/mL (ref 0.35–5.50)

## 2012-06-04 NOTE — Patient Instructions (Addendum)
Labs today for your cholesterol and chemistries and blood sugar Keep watching the sugar and fat in diet - and try to loose weight  Take an extra 30 minute walk each day if you can  Continue to use the same medicines for the rectal itching  Follow up with me in about 6 months

## 2012-06-04 NOTE — Assessment & Plan Note (Signed)
Ongoing No change  Re emphasized use of the antifungal and anti itch creams  Disc hygiene in detail as well  If worse - can send back to dermatology

## 2012-06-04 NOTE — Assessment & Plan Note (Signed)
bp in fair control at this time  No changes needed  Disc lifstyle change with low sodium diet and exercise   Labs today 

## 2012-06-04 NOTE — Assessment & Plan Note (Addendum)
Rev DM diet- still unsure pt understands what she should eat or not eat (pt has MR) Rev exercise- enc more walking for wt loss No symptoms Lab today

## 2012-06-04 NOTE — Progress Notes (Signed)
Subjective:    Patient ID: Linda Thompson, female    DOB: 11/14/1955, 57 y.o.   MRN: [id number]  HPI Here for f/u of chronic health problems  Wt is stable -obese No change in habits   bp is stable today  No cp or palpitations or headaches or edema  No side effects to medicines  BP Readings from Last 3 Encounters:  06/04/12 126/82  05/19/12 128/88  05/17/12 132/72     Diabetes DM diet - she did have DM teaching and tries to avoid sugars and starches as much as possible  Exercise - walks in the house and outdoors when she can- at least a 1/2 hour a day-does not go fast  Symptoms-none at all -no thirst or inc urination  A1C last  Lab Results  Component Value Date   HGBA1C 6.4 11/07/2011    No problems with medications  Renal protection- is allergic to ace inhibitors  Last eye exam 8/13 -no DM eye disease   .hyperlipidemia  On pravastatin Lab Results  Component Value Date   CHOL 165 08/01/2010   HDL 42.00 08/01/2010   LDLCALC 87 08/01/2010   TRIG 180.0* 08/01/2010   CHOLHDL 4 08/01/2010    She still has trouble with rectal  Itching  She uses a ketoconazole cream and also anti itch gel  She still cleans with baby wipes and dries well   Patient Active Problem List   Diagnosis Date Noted  . Right foot pain 05/19/2012  . Right ankle pain 05/19/2012  . Seborrheic keratosis 04/23/2012  . Adverse effects of medication 12/17/2011  . Chronic dermatitis of hands 09/12/2011  . Tinea pedis 07/23/2011  . Obesity 05/05/2011  . [place] intertrigo 11/15/2010  . Low back pain 09/13/2010  . IRRITABLE BOWEL SYNDROME 03/05/2010  . HEART MURMUR 10/12/2009  . TRANSAMINASES, SERUM, ELEVATED 11/22/2007  . DIABETES MELLITUS, TYPE II 08/13/2007  . ALLERGIC  RHINITIS 09/23/2006  . FUNGAL DERMATITIS 08/26/2006  . DERMATITIS, SEBORRHEIC NOS 08/26/2006  . HYPERLIPIDEMIA 03/11/2006  . MENTAL RETARDATION, MODERATE 03/11/2006  . GLAUCOMA NOS 03/11/2006  . HYPERTENSION 03/11/2006  . GERD  03/11/2006  . HIATAL HERNIA 03/11/2006  . PRURITUS ANI 03/11/2006  . OSTEOARTHRITIS, FOOT 03/11/2006  . Personal history of other musculoskeletal disorders 03/11/2006  . MENINGIOMA 12/20/2001   Past Medical History  Diagnosis Date  . Allergic rhinitis, cause unspecified   . Pain in joint, ankle and foot   . Backache, unspecified     chronic LBP with radiculopathy  . Seborrheic dermatitis, unspecified   . Type II or unspecified type diabetes mellitus without mention of complication, not stated as uncontrolled   . History of fracture of arm     Right  . History of fracture of foot   . Dermatomycosis, unspecified   . Esophageal reflux   . Unspecified glaucoma(365.9)   . Undiagnosed cardiac murmurs   . Diaphragmatic hernia without mention of obstruction or gangrene   . Other and unspecified hyperlipidemia   . Unspecified essential hypertension   . Irritable bowel syndrome   . Benign neoplasm of cerebral meninges   . Moderate intellectual disabilities   . Nausea alone   . Osteoarthrosis, unspecified whether generalized or localized, ankle and foot   . Pruritus ani   . Unspecified tinnitus   . Nonspecific elevation of levels of transaminase or lactic acid dehydrogenase (LDH)   . Leukorrhea, not specified as infective   . Obesity   . Hyperglycemia   . DDD (degenerative  disc disease), lumbar   . Yeast infection of the skin     Frequent   Past Surgical History  Procedure Laterality Date  . Total abdominal hysterectomy    . Cataract extraction    . Elbow surgery      right elbow   History  Substance Use Topics  . Smoking status: Never Smoker   . Smokeless tobacco: Never Used  . Alcohol Use: No   Family History  Problem Relation Age of Onset  . Diabetes Mother   . Diabetes Maternal Aunt   . Heart disease Maternal Aunt      (had pacemaker)  . Diabetes Maternal Grandmother   . Stroke Mother   . Stroke Father   . Stroke Maternal Grandmother    Allergies  Allergen  Reactions  . Ace Inhibitors     REACTION: COUGH  . Amoxicillin-Pot Clavulanate     REACTION: diarrhea  . Amoxicillin-Pot Clavulanate Other (See Comments)    Pt unaware of reaction to augmentin  . Azithromycin Diarrhea  . Cyclobenzaprine Hcl     REACTION: diarrhea  . Flexeril (Cyclobenzaprine)   . Flonase (Fluticasone Propionate)     nosebleed  . Ibuprofen     REACTION: vomiting   Current Outpatient Prescriptions on File Prior to Visit  Medication Sig Dispense Refill  . ACCU-CHEK AVIVA PLUS test strip USE EVERY DAY TO CHECK GLUCOSE AND TO CHECK AS NEEDED  100 strip  2  . aspirin 81 MG tablet Take 81 mg by mouth daily.        Marland Kitchen diltiazem (CARDIZEM CD) 240 MG 24 hr capsule Take 1 capsule (240 mg total) by mouth daily.  90 capsule  3  . hydrochlorothiazide (MICROZIDE) 12.5 MG capsule Take 1 capsule (12.5 mg total) by mouth daily.  90 capsule  3  . ketoconazole (NIZORAL) 2 % cream Apply topically as needed.      Marland Kitchen ketoconazole (NIZORAL) 2 % shampoo Apply 1 application topically 2 (two) times a week.      . latanoprost (XALATAN) 0.005 % ophthalmic solution Place 1 drop into both eyes at bedtime.       Marland Kitchen loperamide (IMODIUM) 1 MG/5ML solution Take 1 mg by mouth 4 (four) times daily as needed.      . metFORMIN (GLUCOPHAGE) 500 MG tablet Take 1 tablet (500 mg total) by mouth 2 (two) times daily with a meal.  180 tablet  3  . nystatin (MYCOSTATIN/NYSTOP) 100000 UNIT/GM POWD as needed.      Marland Kitchen omeprazole (PRILOSEC) 20 MG capsule Take 1 capsule (20 mg total) by mouth 2 (two) times daily.  180 capsule  3  . polyethylene glycol powder ([employer] PURELAX) powder Take 17 g by mouth as needed.      . potassium chloride SA (K-DUR,KLOR-CON) 20 MEQ tablet Take 1 tablet (20 mEq total) by mouth daily.  90 tablet  3  . pravastatin (PRAVACHOL) 40 MG tablet Take 1 tablet (40 mg total) by mouth every evening.  90 tablet  3  . PROAIR HFA 108 (90 BASE) MCG/ACT inhaler as directed.       . promethazine (PHENERGAN) 25  MG tablet Take 25 mg by mouth every 6 (six) hours as needed.      Marland Kitchen Propylene Glycol (SYSTANE BALANCE OP) Apply to eye.      . [DISCONTINUED] Calcium Carbonate-Vitamin D (CALCIUM-VITAMIN D) 500-200 MG-UNIT per tablet Take 1 tablet by mouth 2 (two) times daily with a meal.        . [  DISCONTINUED] fexofenadine (ALLEGRA) 180 MG tablet Take 1 tablet (180 mg total) by mouth daily.  15 tablet  0   No current facility-administered medications on file prior to visit.    Review of Systems Review of Systems  Constitutional: Negative for fever, appetite change, fatigue and unexpected weight change.  Eyes: Negative for pain and visual disturbance.  Respiratory: Negative for cough and shortness of breath.   Cardiovascular: Negative for cp or palpitations    Gastrointestinal: Negative for nausea, diarrhea and constipation.  Genitourinary: Negative for urgency and frequency.  Skin: Negative for pallor or rash   Neurological: Negative for weakness, light-headedness, numbness and headaches.  Hematological: Negative for adenopathy. Does not bruise/bleed easily.  Psychiatric/Behavioral: Negative for dysphoric mood. The patient is not nervous/anxious.  pos for MR with slowed learning        Objective:   Physical Exam  Constitutional: She appears well-developed and well-nourished. No distress.  obese and well appearing   HENT:  Head: Normocephalic and atraumatic.  Mouth/Throat: Oropharynx is clear and moist.  Eyes: Conjunctivae and EOM are normal. Pupils are equal, round, and reactive to light. Right eye exhibits no discharge. Left eye exhibits no discharge. No scleral icterus.  Neck: Normal range of motion. Neck supple. No JVD present. Carotid bruit is not present. No thyromegaly present.  Cardiovascular: Normal rate, regular rhythm and intact distal pulses.  Exam reveals no gallop.   Pulmonary/Chest: Effort normal and breath sounds normal. No respiratory distress. She has no wheezes.  Abdominal: Soft.  Bowel sounds are normal. She exhibits no distension and no mass. There is no tenderness.  Musculoskeletal: Normal range of motion. She exhibits no edema and no tenderness.  Lymphadenopathy:    She has no cervical adenopathy.  Neurological: She is alert. She has normal reflexes. No cranial nerve deficit. She exhibits normal muscle tone. Coordination normal.  Skin: Skin is warm and dry. No rash noted. No erythema. No pallor.  Psychiatric: She has a normal mood and affect.  cheerfuls and talkative  With MR - she repeats herself frequently - baseline            Assessment & Plan:

## 2012-06-04 NOTE — Assessment & Plan Note (Signed)
Disc goals for chol and low sat fat diet On pravastatin  Lab today

## 2012-06-07 ENCOUNTER — Encounter: Payer: Self-pay | Admitting: *Deleted

## 2012-06-13 ENCOUNTER — Encounter (HOSPITAL_COMMUNITY): Payer: Self-pay | Admitting: Nurse Practitioner

## 2012-06-13 ENCOUNTER — Emergency Department (HOSPITAL_COMMUNITY)
Admission: EM | Admit: 2012-06-13 | Discharge: 2012-06-13 | Disposition: A | Discharge status: Home / Self Care | Payer: Medicare Other | Attending: Emergency Medicine | Admitting: Emergency Medicine

## 2012-06-13 DIAGNOSIS — R011 Cardiac murmur, unspecified: Secondary | ICD-10-CM | POA: Diagnosis not present

## 2012-06-13 DIAGNOSIS — N3090 Cystitis, unspecified without hematuria: Secondary | ICD-10-CM | POA: Diagnosis present

## 2012-06-13 DIAGNOSIS — Z8781 Personal history of (healed) traumatic fracture: Secondary | ICD-10-CM | POA: Diagnosis not present

## 2012-06-13 DIAGNOSIS — Z8739 Personal history of other diseases of the musculoskeletal system and connective tissue: Secondary | ICD-10-CM | POA: Diagnosis not present

## 2012-06-13 DIAGNOSIS — Z8742 Personal history of other diseases of the female genital tract: Secondary | ICD-10-CM | POA: Insufficient documentation

## 2012-06-13 DIAGNOSIS — Z86011 Personal history of benign neoplasm of the brain: Secondary | ICD-10-CM | POA: Diagnosis not present

## 2012-06-13 DIAGNOSIS — R350 Frequency of micturition: Secondary | ICD-10-CM | POA: Diagnosis not present

## 2012-06-13 DIAGNOSIS — Z8719 Personal history of other diseases of the digestive system: Secondary | ICD-10-CM | POA: Diagnosis not present

## 2012-06-13 DIAGNOSIS — Z90710 Acquired absence of both cervix and uterus: Secondary | ICD-10-CM | POA: Insufficient documentation

## 2012-06-13 DIAGNOSIS — Z8619 Personal history of other infectious and parasitic diseases: Secondary | ICD-10-CM | POA: Diagnosis not present

## 2012-06-13 DIAGNOSIS — L538 Other specified erythematous conditions: Secondary | ICD-10-CM | POA: Insufficient documentation

## 2012-06-13 DIAGNOSIS — Z8709 Personal history of other diseases of the respiratory system: Secondary | ICD-10-CM | POA: Insufficient documentation

## 2012-06-13 DIAGNOSIS — Z872 Personal history of diseases of the skin and subcutaneous tissue: Secondary | ICD-10-CM | POA: Insufficient documentation

## 2012-06-13 DIAGNOSIS — F79 Unspecified intellectual disabilities: Secondary | ICD-10-CM | POA: Diagnosis not present

## 2012-06-13 DIAGNOSIS — R21 Rash and other nonspecific skin eruption: Secondary | ICD-10-CM | POA: Diagnosis not present

## 2012-06-13 DIAGNOSIS — Z3202 Encounter for pregnancy test, result negative: Secondary | ICD-10-CM | POA: Insufficient documentation

## 2012-06-13 DIAGNOSIS — Z862 Personal history of diseases of the blood and blood-forming organs and certain disorders involving the immune mechanism: Secondary | ICD-10-CM | POA: Diagnosis not present

## 2012-06-13 DIAGNOSIS — Z7982 Long term (current) use of aspirin: Secondary | ICD-10-CM | POA: Diagnosis not present

## 2012-06-13 DIAGNOSIS — Z880 Allergy status to penicillin: Secondary | ICD-10-CM | POA: Insufficient documentation

## 2012-06-13 DIAGNOSIS — E669 Obesity, unspecified: Secondary | ICD-10-CM | POA: Diagnosis not present

## 2012-06-13 DIAGNOSIS — I10 Essential (primary) hypertension: Secondary | ICD-10-CM | POA: Diagnosis not present

## 2012-06-13 DIAGNOSIS — H409 Unspecified glaucoma: Secondary | ICD-10-CM | POA: Insufficient documentation

## 2012-06-13 DIAGNOSIS — Z8669 Personal history of other diseases of the nervous system and sense organs: Secondary | ICD-10-CM | POA: Diagnosis not present

## 2012-06-13 DIAGNOSIS — K219 Gastro-esophageal reflux disease without esophagitis: Secondary | ICD-10-CM | POA: Insufficient documentation

## 2012-06-13 DIAGNOSIS — Z8639 Personal history of other endocrine, nutritional and metabolic disease: Secondary | ICD-10-CM | POA: Insufficient documentation

## 2012-06-13 DIAGNOSIS — E119 Type 2 diabetes mellitus without complications: Secondary | ICD-10-CM | POA: Diagnosis not present

## 2012-06-13 DIAGNOSIS — E785 Hyperlipidemia, unspecified: Secondary | ICD-10-CM | POA: Insufficient documentation

## 2012-06-13 DIAGNOSIS — R109 Unspecified abdominal pain: Secondary | ICD-10-CM | POA: Insufficient documentation

## 2012-06-13 DIAGNOSIS — N3000 Acute cystitis without hematuria: Secondary | ICD-10-CM | POA: Diagnosis not present

## 2012-06-13 DIAGNOSIS — Z79899 Other long term (current) drug therapy: Secondary | ICD-10-CM | POA: Insufficient documentation

## 2012-06-13 LAB — URINALYSIS, ROUTINE W REFLEX MICROSCOPIC
Glucose, UA: NEGATIVE mg/dL
Hgb urine dipstick: NEGATIVE
Specific Gravity, Urine: 1.027 (ref 1.005–1.030)
Urobilinogen, UA: 0.2 mg/dL (ref 0.0–1.0)
pH: 5.5 (ref 5.0–8.0)

## 2012-06-13 LAB — POCT PREGNANCY, URINE: Preg Test, Ur: NEGATIVE

## 2012-06-13 LAB — COMPREHENSIVE METABOLIC PANEL
BUN: 22 mg/dL (ref 6–23)
CO2: 25 mEq/L (ref 19–32)
Chloride: 105 mEq/L (ref 96–112)
Creatinine, Ser: 0.81 mg/dL (ref 0.50–1.10)
GFR calc non Af Amer: 79 mL/min — ABNORMAL LOW (ref >90)
Total Bilirubin: 0.1 mg/dL — ABNORMAL LOW (ref 0.3–1.2)

## 2012-06-13 LAB — CBC WITH DIFFERENTIAL/PLATELET
Eosinophils Relative: 1 % (ref 0–5)
Hemoglobin: 12 g/dL (ref 12.0–15.0)
Lymphocytes Relative: 33 % (ref 12–46)
Lymphs Abs: 3.3 10*3/uL (ref 0.7–4.0)
MCV: 78.1 fL (ref 78.0–100.0)
Monocytes Relative: 10 % (ref 3–12)
Platelets: 460 10*3/uL — ABNORMAL HIGH (ref 150–400)
RBC: 5.03 MIL/uL (ref 3.87–5.11)
WBC: 10 10*3/uL (ref 4.0–10.5)

## 2012-06-13 LAB — URINE MICROSCOPIC-ADD ON

## 2012-06-13 MED ORDER — FLUCONAZOLE 150 MG PO TABS: 150.0000 mg | ORAL_TABLET | Freq: Once | ORAL | Status: DC

## 2012-06-13 MED ORDER — CEPHALEXIN 500 MG PO CAPS: 500.0000 mg | ORAL_CAPSULE | Freq: Three times a day (TID) | ORAL | Status: DC

## 2012-06-13 NOTE — ED Notes (Signed)
Pt c/o L groin/pelvic pain for past few hours. Denies n/v/bowel/bladder changes.

## 2012-06-13 NOTE — ED Notes (Signed)
States "I have groin pain, enough to notice." Pt states pain is mild. Pt states pain does not inc with palpation or movement.

## 2012-06-13 NOTE — ED Provider Notes (Signed)
History    CSN: [id number] Arrival date & time 06/13/12  1814  First MD Initiated Contact with Patient 06/13/12 1917    Chief Complaint  Patient presents with  . Pelvic Pain    HPI Comments: Patient is a 57 year old female who presents for evaluation of one-day onset of suprapubic pain.  She reports increased frequency and 2/10 suprapubic discomfort.  She does have a history of mental retardation and tried to present to the urgent care however they were closing and she wanted this to be evaluated today.  She reports no prior history of urinary tract infections.  She denies any fevers, no chills, no rigors, no nausea, no vomiting.  No change in her bowel habits with she does report a history of your old bowel syndrome and does wear depends when she is outside of the house.   Past Medical History  Diagnosis Date  . Allergic rhinitis, cause unspecified   . Pain in joint, ankle and foot   . Backache, unspecified     chronic LBP with radiculopathy  . Seborrheic dermatitis, unspecified   . Type II or unspecified type diabetes mellitus without mention of complication, not stated as uncontrolled   . History of fracture of arm     Right  . History of fracture of foot   . Dermatomycosis, unspecified   . Esophageal reflux   . Unspecified glaucoma(365.9)   . Undiagnosed cardiac murmurs   . Diaphragmatic hernia without mention of obstruction or gangrene   . Other and unspecified hyperlipidemia   . Unspecified essential hypertension   . Irritable bowel syndrome   . Benign neoplasm of cerebral meninges   . Moderate intellectual disabilities   . Nausea alone   . Osteoarthrosis, unspecified whether generalized or localized, ankle and foot   . Pruritus ani   . Unspecified tinnitus   . Nonspecific elevation of levels of transaminase or lactic acid dehydrogenase (LDH)   . Leukorrhea, not specified as infective   . Obesity   . Hyperglycemia   . DDD (degenerative disc disease), lumbar   . Yeast  infection of the skin     Frequent    Past Surgical History  Procedure Laterality Date  . Total abdominal hysterectomy    . Cataract extraction    . Elbow surgery      right elbow    Family History  Problem Relation Age of Onset  . Diabetes Mother   . Diabetes Maternal Aunt   . Heart disease Maternal Aunt      (had pacemaker)  . Diabetes Maternal Grandmother   . Stroke Mother   . Stroke Father   . Stroke Maternal Grandmother     History  Substance Use Topics  . Smoking status: Never Smoker   . Smokeless tobacco: Never Used  . Alcohol Use: No    OB History   Grav Para Term Preterm Abortions TAB SAB Ect Mult Living                  Review of Systems  Constitutional: Negative for fever, chills, diaphoresis, activity change, appetite change and fatigue.  Eyes: Negative.   Respiratory: Negative.  Negative for cough, chest tightness, shortness of breath and wheezing.   Cardiovascular: Negative for chest pain, palpitations and leg swelling.  Gastrointestinal: Negative for nausea, vomiting, abdominal pain (suprapubic pain), diarrhea (hx though of intermittent constipation/diarrhea), constipation, abdominal distention and rectal pain.  Genitourinary: Positive for pelvic pain. Negative for dysuria, urgency, hematuria and flank pain.  Musculoskeletal: Negative.   Skin: Positive for rash (chronic intertrigo).  Allergic/Immunologic: Negative.   Neurological: Negative.   Hematological: Negative.   Psychiatric/Behavioral: Negative.        MR    Allergies  Ace inhibitors; Amoxicillin-pot clavulanate; Augmentin; Azithromycin; Cyclobenzaprine hcl; Flexeril; Flonase; and Ibuprofen  Home Medications   Current Outpatient Rx  Name  Route  Sig  Dispense  Refill  . ACCU-CHEK AVIVA PLUS test strip      USE EVERY DAY TO CHECK GLUCOSE AND TO CHECK AS NEEDED   100 strip   2     DX Code Needed .   . albuterol (PROVENTIL HFA;VENTOLIN HFA) 108 (90 BASE) MCG/ACT inhaler    Inhalation   Inhale 2 puffs into the lungs every 6 (six) hours as needed for wheezing.         Marland Kitchen aspirin 81 MG tablet   Oral   Take 81 mg by mouth daily.           Marland Kitchen diltiazem (CARDIZEM CD) 240 MG 24 hr capsule   Oral   Take 1 capsule (240 mg total) by mouth daily.   90 capsule   3   . hydrochlorothiazide (MICROZIDE) 12.5 MG capsule   Oral   Take 1 capsule (12.5 mg total) by mouth daily.   90 capsule   3   . latanoprost (XALATAN) 0.005 % ophthalmic solution   Both Eyes   Place 1 drop into both eyes at bedtime.          Marland Kitchen loperamide (IMODIUM) 1 MG/5ML solution   Oral   Take 1 mg by mouth 4 (four) times daily as needed for diarrhea or loose stools.          . metFORMIN (GLUCOPHAGE) 500 MG tablet   Oral   Take 1 tablet (500 mg total) by mouth 2 (two) times daily with a meal.   180 tablet   3   . omeprazole (PRILOSEC) 20 MG capsule   Oral   Take 1 capsule (20 mg total) by mouth 2 (two) times daily.   180 capsule   3   . polyethylene glycol powder ([employer] PURELAX) powder   Oral   Take 17 g by mouth daily as needed (constipation).          . potassium chloride SA (K-DUR,KLOR-CON) 20 MEQ tablet   Oral   Take 1 tablet (20 mEq total) by mouth daily.   90 tablet   3   . pravastatin (PRAVACHOL) 40 MG tablet   Oral   Take 1 tablet (40 mg total) by mouth every evening.   90 tablet   3   . Propylene Glycol (SYSTANE BALANCE OP)   Ophthalmic   Apply 2 drops to eye 2 (two) times daily as needed (dry eyes).          . cephALEXin (KEFLEX) 500 MG capsule   Oral   Take 1 capsule (500 mg total) by mouth 3 (three) times daily.   21 capsule   0   . fluconazole (DIFLUCAN) 150 MG tablet   Oral   Take 1 tablet (150 mg total) by mouth once. Take next Saturday once finished with Antibiotics   1 tablet   0     BP 118/79  Pulse 97  Temp(Src) 97.4 F (36.3 C) (Oral)  Resp 18  SpO2 97%  Physical Exam  Nursing note and vitals reviewed. Constitutional: She  appears well-developed and well-nourished. No distress.  HENT:  Head:  Normocephalic and atraumatic.  Eyes: Conjunctivae are normal. Right eye exhibits no discharge. Left eye exhibits no discharge. No scleral icterus.  Neck: No JVD present. No tracheal deviation present.  Cardiovascular: Normal rate, regular rhythm, normal heart sounds and intact distal pulses.  Exam reveals no gallop and no friction rub.   No murmur heard. Pulmonary/Chest: Effort normal and breath sounds normal. No respiratory distress. She has no wheezes. She has no rales.  Abdominal: Soft. She exhibits no distension and no mass. There is tenderness (suprapubic). There is no rebound and no guarding.  No noticeable hernia appreciated  Genitourinary:  Intertrigo appreciated on inguinal folds Full pelvic exam deferred  Musculoskeletal: Normal range of motion. She exhibits no edema and no tenderness.  Neurological: She is alert. She exhibits normal muscle tone.  Skin: Skin is warm and dry. No rash noted. She is not diaphoretic. No erythema. No pallor.  Psychiatric: She has a normal mood and affect. Her behavior is normal. Judgment and thought content normal.    ED Course  Procedures (including critical care time)  Labs Reviewed  COMPREHENSIVE METABOLIC PANEL - Abnormal; Notable for the following:    Glucose, Bld 134 (*)    Alkaline Phosphatase 159 (*)    Total Bilirubin 0.1 (*)    GFR calc non Af Amer 79 (*)    All other components within normal limits  URINALYSIS, ROUTINE W REFLEX MICROSCOPIC - Abnormal; Notable for the following:    APPearance CLOUDY (*)    Leukocytes, UA LARGE (*)    All other components within normal limits  CBC WITH DIFFERENTIAL - Abnormal; Notable for the following:    MCH 23.9 (*)    Platelets 460 (*)    All other components within normal limits  URINE MICROSCOPIC-ADD ON - Abnormal; Notable for the following:    Bacteria, UA FEW (*)    All other components within normal limits  URINE  CULTURE  POCT PREGNANCY, URINE   No results found.   1. Cystitis       MDM  UTI, will treat with Keflex and give 1 Fluconazole worsening of intertrigo and yeast vaginitis.  follow up with PCP.  Discussed the expected loose stools from antibiotic use.  Culture pending.  Vital signs otherwise stable, and no concerning symptoms for systemic involvement        Andrena Mews, DO 06/13/12 2017

## 2012-06-14 LAB — URINE CULTURE

## 2012-06-15 ENCOUNTER — Ambulatory Visit (INDEPENDENT_AMBULATORY_CARE_PROVIDER_SITE_OTHER): Payer: Medicare Other | Admitting: Family Medicine

## 2012-06-15 ENCOUNTER — Encounter: Payer: Self-pay | Admitting: Family Medicine

## 2012-06-15 VITALS — BP 118/82 | HR 96 | Temp 97.6°F | Wt 199.5 lb

## 2012-06-15 DIAGNOSIS — B372 Candidiasis of skin and nail: Secondary | ICD-10-CM

## 2012-06-15 DIAGNOSIS — N3090 Cystitis, unspecified without hematuria: Secondary | ICD-10-CM

## 2012-06-15 NOTE — Patient Instructions (Addendum)
Good to see tou today.  Call us with questions. Finish keflex but only take two times daily for total of 7 days (you will have about 5 left over pills that you may throw away).  Last day of antibiotics should be June 1st. May take diflucan at end of keflex antibiotic course. Start yogurt while you're on antibiotic.

## 2012-06-15 NOTE — Progress Notes (Signed)
  Subjective:    Patient ID: Linda Thompson, female    DOB: 11/11/1955, 57 y.o.   MRN: [id number]  HPI CC: ER f/u  Linda Thompson presents today for ER follow up where she was seen over weekend with dx UTI (large LE on dip and 21-50 WBC/hpf) and intertrigo, treated with keflex 500mg  tid and fluconazole x1.  Describes some pain L inner thigh.  This has improved since she started keflex. Denies dysuria, urgency, frequency, hematuria, flank pain or hematuria, nausea/vomiting, fevers/chills. Denies vaginal discharge. Does have intertrigo.  Past Medical History  Diagnosis Date  . Allergic rhinitis, cause unspecified   . Pain in joint, ankle and foot   . Backache, unspecified     chronic LBP with radiculopathy  . Seborrheic dermatitis, unspecified   . Type II or unspecified type diabetes mellitus without mention of complication, not stated as uncontrolled   . History of fracture of arm     Right  . History of fracture of foot   . Dermatomycosis, unspecified   . Esophageal reflux   . Unspecified glaucoma(365.9)   . Undiagnosed cardiac murmurs   . Diaphragmatic hernia without mention of obstruction or gangrene   . Other and unspecified hyperlipidemia   . Unspecified essential hypertension   . Irritable bowel syndrome   . Benign neoplasm of cerebral meninges   . Moderate intellectual disabilities   . Nausea alone   . Osteoarthrosis, unspecified whether generalized or localized, ankle and foot   . Pruritus ani   . Unspecified tinnitus   . Nonspecific elevation of levels of transaminase or lactic acid dehydrogenase (LDH)   . Leukorrhea, not specified as infective   . Obesity   . Hyperglycemia   . DDD (degenerative disc disease), lumbar   . Yeast infection of the skin     Frequent     Review of Systems Per HPI    Objective:   Physical Exam  Nursing note and vitals reviewed. Constitutional: She appears well-developed and well-nourished. No distress.  Abdominal: Soft. Bowel sounds  are normal. She exhibits no distension and no mass. There is no hepatosplenomegaly. There is tenderness (mild) in the suprapubic area. There is no rigidity, no rebound, no guarding, no CVA tenderness and negative Murphy's sign.  obese  Musculoskeletal: She exhibits no edema.  Skin: Skin is warm and dry. Rash noted.  Faint erythematous rash in skin fold between left thigh and groin       Assessment & Plan:

## 2012-06-15 NOTE — Assessment & Plan Note (Signed)
H/o recurrent yeast infections.  Has seen derm for this, prior on desonide/ketoconazole and anti itch gel. I suggested she take diflucan at end of keflex course.

## 2012-06-15 NOTE — Assessment & Plan Note (Addendum)
UA with large LE, 21-50 WBC. However UCx returned no growth. Regardless I suggested she continue antibiotic course but only take twice daily for total of 7 days. May start yogurt while on keflex.  Take diflucan at end of antibiotic.

## 2012-06-17 NOTE — ED Provider Notes (Addendum)
I saw and evaluated the patient, reviewed the resident's note and I agree with the findings and plan.   .Face to face Exam:  General:  Awake HEENT:  Atraumatic Resp:  Normal effort Abd:  Nondistended Neuro:No focal weakness     Nelia Shi, MD 07/02/12 2201

## 2012-06-21 ENCOUNTER — Telehealth: Payer: Self-pay | Admitting: Family Medicine

## 2012-06-21 NOTE — Telephone Encounter (Signed)
I called pt back to see why she called and pt wanted to 1st see if Dr. Milinda Antis was aware she was in hospital and then had a f/u with Dr. Reece Agar, I advise pt that ER visit and OV with Dr. Reece Agar is in her chart, pt also wanted me to ask you if it is okay for her to take some of her medicine to help with her diarrhea, pt is finished with her antibiotic but she started having a little diarrhea today, pt wanted to to ask you if it's okay to take medicine for diarrhea and if so how many doses should she take, please advise

## 2012-06-21 NOTE — Telephone Encounter (Signed)
2 doses per day maximum and if diarrhea worsens let me know

## 2012-06-21 NOTE — Telephone Encounter (Signed)
Pt notified of Dr. Royden Purl recommendations and advise to updated [place] if sxs worsen

## 2012-06-21 NOTE — Telephone Encounter (Signed)
Left voicemail with name and phone #, nothing else

## 2012-06-23 ENCOUNTER — Ambulatory Visit (INDEPENDENT_AMBULATORY_CARE_PROVIDER_SITE_OTHER): Payer: Medicare Other | Admitting: Family Medicine

## 2012-06-23 ENCOUNTER — Encounter: Payer: Self-pay | Admitting: Family Medicine

## 2012-06-23 VITALS — BP 128/82 | HR 88 | Temp 97.6°F | Wt 199.5 lb

## 2012-06-23 DIAGNOSIS — R197 Diarrhea, unspecified: Secondary | ICD-10-CM

## 2012-06-23 NOTE — Assessment & Plan Note (Signed)
Transient, self limited. Anticipate keflex related, but no evidence of colitis. May use immodium prn diarrhea, likely will no longer need. Reassurance provided.

## 2012-06-23 NOTE — Progress Notes (Signed)
  Subjective:    Patient ID: Linda Thompson, female    DOB: 21-Jan-1955, 57 y.o.   MRN: [id number]  HPI CC: diarrhea, weak.  Seen here last week as ER f/u with dx UTI, treated with keflex TID.  I asked her to decrease to BID dosing.  completed 7d course.  She has completed keflex dosing.  Had an episode of diarrhea at grocery store.  When she got home, noticed she had soiled depens (stool).  Only had one episode of diarrhea. Called our office and we suggested she take immodium suspension max twice daily as needed.  Bowels are back to normal. Wanted to be evaluated because still felt a bit weak - now she feels strength returning. Good appetite.  Eating normally.   Drinking plenty of water. No fevers/chills. Voiding well.  Review of Systems Per HPI    Objective:   Physical Exam  Nursing note and vitals reviewed. Constitutional: She appears well-developed and well-nourished. No distress.  HENT:  Mouth/Throat: Oropharynx is clear and moist. No oropharyngeal exudate.  Eyes: Conjunctivae and EOM are normal. Pupils are equal, round, and reactive to light. No scleral icterus.  Abdominal: Soft. Normal appearance and bowel sounds are normal. She exhibits no distension and no mass. There is no hepatosplenomegaly. There is no tenderness. There is no rebound, no guarding and no CVA tenderness.  Musculoskeletal: She exhibits no edema.  Skin: Skin is warm and dry. No rash noted.  Brisk cap refill, normal skin turgor  Psychiatric: She has a normal mood and affect.       Assessment & Plan:

## 2012-06-23 NOTE — Patient Instructions (Signed)
I'm glad you're feeling better!

## 2012-07-02 DIAGNOSIS — L608 Other nail disorders: Secondary | ICD-10-CM | POA: Diagnosis not present

## 2012-07-02 DIAGNOSIS — E1149 Type 2 diabetes mellitus with other diabetic neurological complication: Secondary | ICD-10-CM | POA: Diagnosis not present

## 2012-07-05 ENCOUNTER — Telehealth: Payer: Self-pay

## 2012-07-05 NOTE — Telephone Encounter (Signed)
Pt notified it's okay to take med

## 2012-07-05 NOTE — Telephone Encounter (Signed)
Yes- that is fine to take a dose of immodium before shopping or other day trips

## 2012-07-05 NOTE — Telephone Encounter (Signed)
Pt wants to make sure if goes shopping this Linda Thompson; it is OK to take Immodium on Thur morning before leave home going shopping. Pt said some one told her Dr Milinda Antis said it was OK but pt wanted to verify. Advised pt Dr Milinda Antis had said in the past can take dose of Immodium prior to going on an outing.Pt wants to ck with Dr Milinda Antis to be sure.Please advise.

## 2012-07-06 ENCOUNTER — Telehealth: Payer: Self-pay

## 2012-07-06 NOTE — Telephone Encounter (Signed)
Pt said has been nervous and had 2 loose BMs today. Pt took Immodium 1 hour ago; pt wants to know if continues with loose BM can she take Immodium again tonight. Advised pt can take 1 mg/ 5 ml q 6 h as needed for diarrhea or loose BM. But not to take Immodium unless diarrhea. Pt voiced understanding.

## 2012-07-06 NOTE — Telephone Encounter (Signed)
Agree with above 

## 2012-07-07 ENCOUNTER — Encounter: Payer: Self-pay | Admitting: Family Medicine

## 2012-07-07 ENCOUNTER — Telehealth: Payer: Self-pay | Admitting: Family Medicine

## 2012-07-07 ENCOUNTER — Ambulatory Visit (INDEPENDENT_AMBULATORY_CARE_PROVIDER_SITE_OTHER): Payer: Medicare Other | Admitting: Family Medicine

## 2012-07-07 ENCOUNTER — Telehealth: Payer: Self-pay

## 2012-07-07 VITALS — BP 136/82 | HR 96 | Temp 98.0°F | Wt 197.5 lb

## 2012-07-07 DIAGNOSIS — K589 Irritable bowel syndrome without diarrhea: Secondary | ICD-10-CM

## 2012-07-07 NOTE — Patient Instructions (Signed)
I think this was an irritable bowel flare due to spicy food. Only use immodium if having frequent episodes of diarrhea I think you are doing well today. Good to see you today.

## 2012-07-07 NOTE — Progress Notes (Signed)
  Subjective:    Patient ID: Linda Thompson, female    DOB: Mar 30, 1955, 57 y.o.   MRN: [id number]  HPI CC: epigastric burning  H/o IBS - yesterday bowels moved twice.  Took immodium solution which helped. Last night at church ate something that did not agree with her.  Last night had another episode of loose stool - took some more immodium. Called [facility] - advised to see PCP for this.  No fevers, nausea or vomiting.  Over weekend went to [facility] for constipation, prescribed miralax.  On omeprazole 20mg  bid.  Past Medical History  Diagnosis Date  . Allergic rhinitis, cause unspecified   . Pain in joint, ankle and foot   . Backache, unspecified     chronic LBP with radiculopathy  . Seborrheic dermatitis, unspecified   . Type II or unspecified type diabetes mellitus without mention of complication, not stated as uncontrolled   . History of fracture of arm     Right  . History of fracture of foot   . Dermatomycosis, unspecified   . Esophageal reflux   . Unspecified glaucoma(365.9)   . Undiagnosed cardiac murmurs   . Diaphragmatic hernia without mention of obstruction or gangrene   . Other and unspecified hyperlipidemia   . Unspecified essential hypertension   . Irritable bowel syndrome   . Benign neoplasm of cerebral meninges   . Moderate intellectual disabilities   . Nausea alone   . Osteoarthrosis, unspecified whether generalized or localized, ankle and foot   . Pruritus ani   . Unspecified tinnitus   . Nonspecific elevation of levels of transaminase or lactic acid dehydrogenase (LDH)   . Leukorrhea, not specified as infective   . Obesity   . Hyperglycemia   . DDD (degenerative disc disease), lumbar   . Yeast infection of the skin     Frequent      Review of Systems per HPI    Objective:   Physical Exam  Nursing note and vitals reviewed. Constitutional: She appears well-developed and well-nourished. No distress.  Abdominal: Soft. Normal appearance and bowel sounds are  normal. She exhibits no distension and no mass. There is no hepatosplenomegaly. There is no tenderness. There is no rigidity, no rebound, no guarding, no CVA tenderness and negative Murphy's sign.  obese  Psychiatric: She has a normal mood and affect.  pleasant       Assessment & Plan:

## 2012-07-07 NOTE — Assessment & Plan Note (Signed)
Anticipate flare with diarrhea due to recent spicy dinner - reasurred as sxs have since improved Return as needed.

## 2012-07-07 NOTE — Telephone Encounter (Signed)
Triage Record Num: [id number] Operator: Edgar Frisk Patient Name: Linda Thompson Call Date & Time: 07/07/2012 1:16:11AM Patient Phone: [phone] PCP: Audrie Gallus. Tower Patient Gender: Female PCP Fax : Patient DOB: 05-22-1955 Practice Name: [facility] Reason for Call: Caller: [name]/Patient; PCP: Roxy Manns [facility]); CB#: [phone]; Call regarding Diarrhea; Pt reports developed diarrhea yesterday , ? time. Had couple of diarrhea stools, took Imodium AD and no diarrhea since second dose taken. No cramping, no other symptoms. Diarrhea Guideline - Neg responses til - All other situations Disposition - Provide Home Care - Care advice per guideline Signs and symptoms for call back reviewed Protocol(s) Used: Diarrhea or Other Change in Bowel Habits Recommended Outcome per Protocol: Provide Home/Self Care Reason for Outcome: All other situations Care Advice: ~ Avoid foods or drinks that cause or worsen symptoms. Diarrheal Care: - Drink 2-3 quarts (2-3 liters) per day of low sugar content fluids, including over the counter oral hydration solution, unless directed otherwise by provider. - If accompanied by vomiting, take the fluids in frequent small sips or suck on ice chips. - Eat easily digested foods (such as bananas, rice, applesauce, toast, cooked cereals, soup, crackers, baked or boiled potato, or baked chicken or [place] without skin). - Do not eat high fiber, high fat, high sugar content foods, or highly seasoned foods. - Do not drink caffeinated or alcoholic beverages. - Avoid milk and milk products while having symptoms. As symptoms improve, gradually add back to diet. - Application of A&D ointment or witch hazel medicated pads may help anal irritation. - Antidiarrheal medications are usually unnecessary. If symptoms are severe, consider nonprescription antidiarrheal and anti-motility drugs as directed by label or a provider. Do not take if have high fever or  bloody diarrhea. If pregnant, do not take any medications not approved by your provider. - Consult your provider for advice regarding continuing prescription medication. ~ 06/

## 2012-07-07 NOTE — Telephone Encounter (Signed)
[facility] Triage Call Report Triage Record Num: [id number] Operator: Edgar Frisk Patient Name: Linda Thompson Call Date & Time: 07/07/2012 1:16:11AM Patient Phone: [phone] PCP: Audrie Gallus. Tower Patient Gender: Female PCP Fax : Patient DOB: 1956-01-09 Practice Name: [facility] Reason for Call: Caller: Maryuri/Patient; PCP: Roxy Manns [facility]); CB#: [phone]; Call regarding Diarrhea; Pt reports developed diarrhea yesterday , ? time. Had couple of diarrhea stools, took Imodium AD and no diarrhea since second dose taken. No cramping, no other symptoms. Diarrhea Guideline - Neg responses til - All other situations Disposition - Provide Home Care - Care advice per guideline Signs and symptoms for call back reviewed Protocol(s) Used: Diarrhea or Other Change in Bowel Habits Recommended Outcome per Protocol: Provide Home/Self Care Reason for Outcome: All other situations Care Advice: ~ Avoid foods or drinks that cause or worsen symptoms. Diarrheal Care: - Drink 2-3 quarts (2-3 liters) per day of low sugar content fluids, including over the counter oral hydration solution, unless directed otherwise by provider. - If accompanied by vomiting, take the fluids in frequent small sips or suck on ice chips. - Eat easily digested foods (such as bananas, rice, applesauce, toast, cooked cereals, soup, crackers, baked or boiled potato, or baked chicken or [place] without skin). - Do not eat high fiber, high fat, high sugar content foods, or highly seasoned foods. - Do not drink caffeinated or alcoholic beverages. - Avoid milk and milk products while having symptoms. As symptoms improve, gradually add back to diet. - Application of A&D ointment or witch hazel medicated pads may help anal irritation. - Antidiarrheal medications are usually unnecessary. If symptoms are severe, consider nonprescription antidiarrheal and anti-motility drugs as directed by label or a provider. Do  not take if have high fever or bloody diarrhea. If pregnant, do not take any medications not approved by your provider. - Consult your provider for advice regarding continuing prescription medication. ~ 07/07/2012 2:00:56AM Page 1 of 1 [id number]

## 2012-07-09 ENCOUNTER — Telehealth: Payer: Self-pay

## 2012-07-09 NOTE — Telephone Encounter (Signed)
Pt wants to make sure of reason to take Purelax and Miralax (used for constipation but are same med so do not take at same time). Pt wants to know if can take Loperamide to prevent diarrhea so can go to church tonight for vacation bible school. Dr Milinda Antis tells pt it is OK x 1 to take Loperamide to prevent diarrhea if going out of town or a church function. Pt voiced understanding to take Loperamide x 1 only.

## 2012-07-09 NOTE — Telephone Encounter (Signed)
Agree. Thanks

## 2012-07-12 ENCOUNTER — Telehealth: Payer: Self-pay

## 2012-07-12 NOTE — Telephone Encounter (Signed)
Pt said upper stomach has been hurting more last several day. Pt having upper abd dull ache on and off; pt is trying to avoid spicy or greasy food. Pt ate some chicken and broccoli today and has had 2 BMs today , not diarrhea, but her stomach still hurts on and off; no fever or no nausea and vomiting and wanted to know if should see her stomach doctor. Pt will call Dr Marzetta Board office for f/u appt(last saw Dr Arlyce Dice 10/2011) if thinks that is appropriate thing to do.Please advise.

## 2012-07-13 NOTE — Telephone Encounter (Signed)
Noted. I'm glad we can see her tomorrow.

## 2012-07-13 NOTE — Telephone Encounter (Signed)
Noted  

## 2012-07-13 NOTE — Telephone Encounter (Signed)
I would first ensure she is drinking plenty of water and avoiding loperamide solution if no diarrhea. Also ensure taking omeprazole 20mg  twice daily. If this doesn't improve pain, recommend start here with evaluation.

## 2012-07-13 NOTE — Telephone Encounter (Signed)
Message left for patient to return my call.  

## 2012-07-13 NOTE — Telephone Encounter (Signed)
Pt walked in notified as instructed. Pt said she drinks water when taking med, at each meal and thru out the day; pt is taking omeprazole 20 mg twice daily and will avoid loperamide unless diarrhea. Today pt is not hurting but refuses to wait for appt next week with PCP.Dr Sharen Hones does not have available appt this week. Spoke with Carlena Sax team lead and appt scheduled with Dr Ermalene Searing on 07/14/12. Pt will bring all meds to appt.

## 2012-07-14 ENCOUNTER — Ambulatory Visit (INDEPENDENT_AMBULATORY_CARE_PROVIDER_SITE_OTHER): Payer: Medicare Other | Admitting: Family Medicine

## 2012-07-14 ENCOUNTER — Encounter: Payer: Self-pay | Admitting: Family Medicine

## 2012-07-14 VITALS — BP 104/70 | HR 98 | Temp 97.6°F | Wt 196.0 lb

## 2012-07-14 DIAGNOSIS — K589 Irritable bowel syndrome without diarrhea: Secondary | ICD-10-CM

## 2012-07-14 NOTE — Progress Notes (Signed)
  Subjective:    Patient ID: Linda Thompson, female    DOB: January 23, 1955, 57 y.o.   MRN: [id number]  HPI  57 year old female with history of hiatal hernia and IBS presents with continued abdominal cramping. Points to pain in epigastrium.  Last time she had pain was 4 days ago. She is a poor historian.  No N/V, no fever, no dysuria. No heartburn. No cp, no SOB. On prilosec 20 mg BID.  She started symptoms ever since having spicy food in  March.  Was seen at Urgent care. Was told to take mirilax for constipation at that time. Her symptoms resolved until last week. She saw Dr. Reece Agar on 6/14 for flare of IBS .  She reports normal BMs. Had nml BM this AM.  It seems she may be taking immodium and mirilax at the same time at times. Took immodium today even though regular BM. Also sounds like she took mirilax last weekend for pain... But was not having constipaiton.     Review of Systems  Constitutional: Negative for fever and fatigue.  HENT: Negative for ear pain.   Eyes: Negative for pain.  Respiratory: Negative for chest tightness and shortness of breath.   Cardiovascular: Negative for chest pain, palpitations and leg swelling.  Gastrointestinal: Negative for abdominal pain.  Genitourinary: Negative for dysuria.       Objective:   Physical Exam  Constitutional: Vital signs are normal. She appears well-developed and well-nourished. She is cooperative.  Non-toxic appearance. She does not appear ill. No distress.  obesity  HENT:  Head: Normocephalic.  Right Ear: Hearing, tympanic membrane, external ear and ear canal normal. Tympanic membrane is not erythematous, not retracted and not bulging.  Left Ear: Hearing, tympanic membrane, external ear and ear canal normal. Tympanic membrane is not erythematous, not retracted and not bulging.  Nose: No mucosal edema or rhinorrhea. Right sinus exhibits no maxillary sinus tenderness and no frontal sinus tenderness. Left sinus exhibits no maxillary  sinus tenderness and no frontal sinus tenderness.  Mouth/Throat: Uvula is midline, oropharynx is clear and moist and mucous membranes are normal.  Eyes: Conjunctivae, EOM and lids are normal. Pupils are equal, round, and reactive to light. No foreign bodies found.  Neck: Trachea normal and normal range of motion. Neck supple. Carotid bruit is not present. No mass and no thyromegaly present.  Cardiovascular: Normal rate, regular rhythm, S1 normal, S2 normal, normal heart sounds, intact distal pulses and normal pulses.  Exam reveals no gallop and no friction rub.   No murmur heard. Pulmonary/Chest: Effort normal and breath sounds normal. Not tachypneic. No respiratory distress. She has no decreased breath sounds. She has no wheezes. She has no rhonchi. She has no rales.  Abdominal: Soft. Normal appearance and bowel sounds are normal. There is no tenderness.  Neurological: She is alert.  ? Low IQ  Skin: Skin is warm, dry and intact. No rash noted.  Psychiatric: Her speech is normal and behavior is normal. Judgment and thought content normal. Her mood appears not anxious. Cognition and memory are normal. She does not exhibit a depressed mood.          Assessment & Plan:

## 2012-07-14 NOTE — Assessment & Plan Note (Signed)
Discussed correct use of immodium and mirilax. Limit use in general.  Instead increase fiber, water, exercise. Avoid food triggers.

## 2012-07-14 NOTE — Patient Instructions (Addendum)
Purelax: ( powder) Use only if hard bowel movement or decreased bowel movements. Immodium ( green liquid): Use only if diarrhea opccuring at that time.  Avoid greasyt foods, spicy foods.  Increase fiber and water in diet.

## 2012-07-19 ENCOUNTER — Telehealth: Payer: Self-pay

## 2012-07-19 NOTE — Telephone Encounter (Signed)
Pt said she ate cereal and milk this morning; had watery stool x 3 this morning; then pt took immodium and no BM since took med. Pt wants to know if milk might cause diarrhea. [facility] [place]. Pt request cb.

## 2012-07-19 NOTE — Telephone Encounter (Signed)
Left voicemail requesting pt to call office tomorrow

## 2012-07-19 NOTE — Telephone Encounter (Signed)
Milk causes diarrhea in some people - I do not think it has given her problems in the past - but instruct her to avoid it for several days just to see how she does and keep me updated

## 2012-07-20 NOTE — Telephone Encounter (Signed)
Pt came by office and notified as instructed.

## 2012-07-24 ENCOUNTER — Emergency Department (HOSPITAL_COMMUNITY)
Admission: EM | Admit: 2012-07-24 | Discharge: 2012-07-24 | Disposition: A | Discharge status: Home / Self Care | Payer: Medicare Other | Attending: Emergency Medicine | Admitting: Emergency Medicine

## 2012-07-24 ENCOUNTER — Emergency Department (HOSPITAL_COMMUNITY): Payer: Medicare Other

## 2012-07-24 ENCOUNTER — Encounter (HOSPITAL_COMMUNITY): Payer: Self-pay | Admitting: Emergency Medicine

## 2012-07-24 DIAGNOSIS — R011 Cardiac murmur, unspecified: Secondary | ICD-10-CM | POA: Diagnosis not present

## 2012-07-24 DIAGNOSIS — Z8661 Personal history of infections of the central nervous system: Secondary | ICD-10-CM | POA: Diagnosis not present

## 2012-07-24 DIAGNOSIS — M549 Dorsalgia, unspecified: Secondary | ICD-10-CM | POA: Diagnosis not present

## 2012-07-24 DIAGNOSIS — M545 Low back pain, unspecified: Secondary | ICD-10-CM | POA: Insufficient documentation

## 2012-07-24 DIAGNOSIS — M5137 Other intervertebral disc degeneration, lumbosacral region: Secondary | ICD-10-CM | POA: Insufficient documentation

## 2012-07-24 DIAGNOSIS — Z8619 Personal history of other infectious and parasitic diseases: Secondary | ICD-10-CM | POA: Insufficient documentation

## 2012-07-24 DIAGNOSIS — Z872 Personal history of diseases of the skin and subcutaneous tissue: Secondary | ICD-10-CM | POA: Diagnosis not present

## 2012-07-24 DIAGNOSIS — E785 Hyperlipidemia, unspecified: Secondary | ICD-10-CM | POA: Diagnosis not present

## 2012-07-24 DIAGNOSIS — I10 Essential (primary) hypertension: Secondary | ICD-10-CM | POA: Diagnosis not present

## 2012-07-24 DIAGNOSIS — Z8719 Personal history of other diseases of the digestive system: Secondary | ICD-10-CM | POA: Diagnosis not present

## 2012-07-24 DIAGNOSIS — R5381 Other malaise: Secondary | ICD-10-CM | POA: Insufficient documentation

## 2012-07-24 DIAGNOSIS — Z7982 Long term (current) use of aspirin: Secondary | ICD-10-CM | POA: Diagnosis not present

## 2012-07-24 DIAGNOSIS — K219 Gastro-esophageal reflux disease without esophagitis: Secondary | ICD-10-CM | POA: Diagnosis not present

## 2012-07-24 DIAGNOSIS — E119 Type 2 diabetes mellitus without complications: Secondary | ICD-10-CM | POA: Insufficient documentation

## 2012-07-24 DIAGNOSIS — G8929 Other chronic pain: Secondary | ICD-10-CM | POA: Diagnosis not present

## 2012-07-24 DIAGNOSIS — Z8742 Personal history of other diseases of the female genital tract: Secondary | ICD-10-CM | POA: Insufficient documentation

## 2012-07-24 DIAGNOSIS — Z79899 Other long term (current) drug therapy: Secondary | ICD-10-CM | POA: Insufficient documentation

## 2012-07-24 DIAGNOSIS — M19079 Primary osteoarthritis, unspecified ankle and foot: Secondary | ICD-10-CM | POA: Insufficient documentation

## 2012-07-24 DIAGNOSIS — E669 Obesity, unspecified: Secondary | ICD-10-CM | POA: Insufficient documentation

## 2012-07-24 DIAGNOSIS — M51379 Other intervertebral disc degeneration, lumbosacral region without mention of lumbar back pain or lower extremity pain: Secondary | ICD-10-CM | POA: Insufficient documentation

## 2012-07-24 DIAGNOSIS — Z8781 Personal history of (healed) traumatic fracture: Secondary | ICD-10-CM | POA: Insufficient documentation

## 2012-07-24 DIAGNOSIS — M47817 Spondylosis without myelopathy or radiculopathy, lumbosacral region: Secondary | ICD-10-CM | POA: Diagnosis not present

## 2012-07-24 MED ORDER — OXYCODONE-ACETAMINOPHEN 5-325 MG PO TABS: 1.0000 | ORAL_TABLET | Freq: Four times a day (QID) | ORAL | Status: DC | PRN

## 2012-07-24 MED ORDER — OXYCODONE-ACETAMINOPHEN 5-325 MG PO TABS: 2.0000 | ORAL_TABLET | ORAL | Status: DC | PRN

## 2012-07-24 MED ORDER — ONDANSETRON 4 MG PO TBDP
4.0000 mg | ORAL_TABLET | Freq: Once | ORAL | Status: AC
  Administered 2012-07-24: 4 mg via ORAL
  Filled 2012-07-24: qty 1

## 2012-07-24 MED ORDER — KETOROLAC TROMETHAMINE 60 MG/2ML IM SOLN
60.0000 mg | Freq: Once | INTRAMUSCULAR | Status: AC
  Administered 2012-07-24: 60 mg via INTRAMUSCULAR
  Filled 2012-07-24: qty 2

## 2012-07-24 NOTE — ED Notes (Signed)
Patient transported to X-ray 

## 2012-07-24 NOTE — ED Provider Notes (Signed)
History    CSN: [id number] Arrival date & time 07/24/12  0732  First MD Initiated Contact with Patient 07/24/12 [phone]     Chief Complaint  Patient presents with  . Back Pain   (Consider location/radiation/quality/duration/timing/severity/associated sxs/prior Treatment) The history is provided by the patient.  Linda Thompson is a 57 y.o. female hx of back arthritis, DM, here with worsening back pain and L leg weakness. She has chronic back pain and left leg weakness. She had pain medicine shots before with Dr. Ophelia Charter. She states that her pain got worse recently and she had difficulty walking around as much. She is currently not on any prescription pain medication. Denies numbness or fevers.     Past Medical History  Diagnosis Date  . Allergic rhinitis, cause unspecified   . Pain in joint, ankle and foot   . Backache, unspecified     chronic LBP with radiculopathy  . Seborrheic dermatitis, unspecified   . Type II or unspecified type diabetes mellitus without mention of complication, not stated as uncontrolled   . History of fracture of arm     Right  . History of fracture of foot   . Dermatomycosis, unspecified   . Esophageal reflux   . Unspecified glaucoma(365.9)   . Undiagnosed cardiac murmurs   . Diaphragmatic hernia without mention of obstruction or gangrene   . Other and unspecified hyperlipidemia   . Unspecified essential hypertension   . Irritable bowel syndrome   . Benign neoplasm of cerebral meninges   . Moderate intellectual disabilities   . Nausea alone   . Osteoarthrosis, unspecified whether generalized or localized, ankle and foot   . Pruritus ani   . Unspecified tinnitus   . Nonspecific elevation of levels of transaminase or lactic acid dehydrogenase (LDH)   . Leukorrhea, not specified as infective   . Obesity   . Hyperglycemia   . DDD (degenerative disc disease), lumbar   . Yeast infection of the skin     Frequent   Past Surgical History  Procedure  Laterality Date  . Total abdominal hysterectomy    . Cataract extraction    . Elbow surgery      right elbow   Family History  Problem Relation Age of Onset  . Diabetes Mother   . Diabetes Maternal Aunt   . Heart disease Maternal Aunt      (had pacemaker)  . Diabetes Maternal Grandmother   . Stroke Mother   . Stroke Father   . Stroke Maternal Grandmother    History  Substance Use Topics  . Smoking status: Never Smoker   . Smokeless tobacco: Never Used  . Alcohol Use: No   OB History   Grav Para Term Preterm Abortions TAB SAB Ect Mult Living                 Review of Systems  Musculoskeletal: Positive for back pain.  All other systems reviewed and are negative.    Allergies  Ace inhibitors; Amoxicillin-pot clavulanate; Augmentin; Azithromycin; Cyclobenzaprine hcl; Flexeril; Flonase; and Ibuprofen  Home Medications   Current Outpatient Rx  Name  Route  Sig  Dispense  Refill  . ACCU-CHEK AVIVA PLUS test strip      USE EVERY DAY TO CHECK GLUCOSE AND TO CHECK AS NEEDED   100 strip   2     DX Code Needed .   . albuterol (PROVENTIL HFA;VENTOLIN HFA) 108 (90 BASE) MCG/ACT inhaler   Inhalation   Inhale 2  puffs into the lungs every 6 (six) hours as needed for wheezing.         Marland Kitchen aspirin 81 MG tablet   Oral   Take 81 mg by mouth daily.           Marland Kitchen diltiazem (CARDIZEM CD) 240 MG 24 hr capsule   Oral   Take 1 capsule (240 mg total) by mouth daily.   90 capsule   3   . hydrochlorothiazide (MICROZIDE) 12.5 MG capsule   Oral   Take 1 capsule (12.5 mg total) by mouth daily.   90 capsule   3   . latanoprost (XALATAN) 0.005 % ophthalmic solution   Both Eyes   Place 1 drop into both eyes at bedtime.          . metFORMIN (GLUCOPHAGE) 500 MG tablet   Oral   Take 1 tablet (500 mg total) by mouth 2 (two) times daily with a meal.   180 tablet   3   . omeprazole (PRILOSEC) 20 MG capsule   Oral   Take 1 capsule (20 mg total) by mouth 2 (two) times daily.    180 capsule   3   . potassium chloride SA (K-DUR,KLOR-CON) 20 MEQ tablet   Oral   Take 1 tablet (20 mEq total) by mouth daily.   90 tablet   3   . pravastatin (PRAVACHOL) 40 MG tablet   Oral   Take 1 tablet (40 mg total) by mouth every evening.   90 tablet   3   . Propylene Glycol (SYSTANE BALANCE OP)   Ophthalmic   Apply 2 drops to eye 2 (two) times daily as needed (dry eyes).           BP 130/80  Pulse 98  Temp(Src) 97.3 F (36.3 C) (Oral)  Resp 20  SpO2 98% Physical Exam  Nursing note and vitals reviewed. Constitutional: She is oriented to person, place, and time. She appears well-developed and well-nourished.  Slightly uncomfortable   HENT:  Head: Normocephalic.  Mouth/Throat: Oropharynx is clear and moist.  Eyes: Conjunctivae are normal. Pupils are equal, round, and reactive to light.  Neck: Normal range of motion. Neck supple.  Cardiovascular: Normal rate, regular rhythm and normal heart sounds.   Pulmonary/Chest: Effort normal and breath sounds normal. No respiratory distress. She has no wheezes. She has no rales.  Abdominal: Soft. Bowel sounds are normal. She exhibits no distension. There is no tenderness. There is no rebound and no guarding.  Musculoskeletal: Normal range of motion.  Nl ROM bilateral hip and knees.   Neurological: She is alert and oriented to person, place, and time.  No saddle anesthesia. Neg straight leg raise. Stable gait but prefers L side. Nl reflexes   Skin: Skin is warm and dry.  Psychiatric: She has a normal mood and affect. Her behavior is normal. Judgment and thought content normal.    ED Course  Procedures (including critical care time) Labs Reviewed - No data to display Dg Lumbar Spine Complete  07/24/2012   *RADIOLOGY REPORT*  Clinical Data: Back pain.  LUMBAR SPINE - COMPLETE 4+ VIEW  Comparison: 01/31/2008  Findings: Again noted is mild rotary scoliosis of the lower lumbar spine.  There is evidence for chronic pars defects at  L3, most obvious on the left side.  Alignment of the lumbar spine has not significantly changed.  Mild degenerative endplate changes. Vertebral body heights are maintained.  Degenerative facet changes in the lower lumbar spine.  IMPRESSION: No acute bony abnormality in the lumbar spine.  Degenerative and chronic changes as described.   Original Report Authenticated By: Richarda Overlie, M.D.   No diagnosis found.  MDM  Linda Thompson is a 57 y.o. female here with back pain. Likely sciatica vs arthritis. No neuro deficit. Patient request getting xray even though I think its just going to show arthritis. No need for MRI currently. Will give prescription for pain medicine until she can see Dr. Ophelia Charter.   9:43 AM Xray showed arthritis. D/c home with short course of percocet and she will f/u outpatient.    Richardean Canal, MD 07/24/12 [phone]

## 2012-07-24 NOTE — ED Notes (Signed)
Pt from home, c/o chronic back pain has hx of bulging disc. States increased difficulty ambulating rating pain 5/10.

## 2012-07-26 ENCOUNTER — Ambulatory Visit (INDEPENDENT_AMBULATORY_CARE_PROVIDER_SITE_OTHER): Payer: Medicare Other | Admitting: Family Medicine

## 2012-07-26 ENCOUNTER — Encounter: Payer: Self-pay | Admitting: Family Medicine

## 2012-07-26 VITALS — BP 122/88 | HR 80 | Temp 97.5°F | Wt 196.5 lb

## 2012-07-26 DIAGNOSIS — M545 Low back pain, unspecified: Secondary | ICD-10-CM

## 2012-07-26 DIAGNOSIS — M549 Dorsalgia, unspecified: Secondary | ICD-10-CM | POA: Diagnosis not present

## 2012-07-26 MED ORDER — OXYCODONE-ACETAMINOPHEN 5-325 MG PO TABS: 0.5000 | ORAL_TABLET | Freq: Four times a day (QID) | ORAL | Status: DC | PRN

## 2012-07-26 NOTE — Progress Notes (Signed)
She had see Dr. Lorrin Goodell, also Dr. Milinda Antis re: back and leg pain.  Per patient, she had MRI prev with likely OA and nerve root compression in the lower back causing her L leg pain.  Over the weekend, she has more pain in L leg/buttock and was seen at ER.  Note and plain film report reviewed.  She hadn't taken the percocet yet for pain.  She is aware of sedation caution on meds.   She continues to have L buttock pain and walks leaning forward.  Pain sitting down but not as much laying down.  No weakness.  No trauma.  No falls.  She has a stool softener for a bowel regimen.   Meds, vitals, and allergies reviewed.   ROS: See HPI.  Otherwise, noncontributory.  nad ncat Mmm rrr ctab Back w/o midline pain abd soft, not ttp L buttock not ttp, no rash No pain on SI testing or with int rotation of L hip SLR neg Distally nv intact in the BLE

## 2012-07-26 NOTE — Assessment & Plan Note (Signed)
Mainly in L buttock and I am concerned for radicular source, though SLR is neg.  Will refer back to ortho and will notify PCP.  D/w pt about pain meds, start bowel regimen and she can break the percocet in half.  She agrees.  No weakness in BLE on exam.

## 2012-07-26 NOTE — Patient Instructions (Signed)
Take the stool softener daily and take a half a percocet for pain as needed.  It can make you sleepy.   See Shirlee Limerick about your referral before you leave today. Take care.

## 2012-07-27 ENCOUNTER — Telehealth: Payer: Self-pay

## 2012-07-27 DIAGNOSIS — M545 Low back pain: Secondary | ICD-10-CM | POA: Diagnosis not present

## 2012-07-27 DIAGNOSIS — M5137 Other intervertebral disc degeneration, lumbosacral region: Secondary | ICD-10-CM | POA: Diagnosis not present

## 2012-07-27 DIAGNOSIS — IMO0002 Reserved for concepts with insufficient information to code with codable children: Secondary | ICD-10-CM | POA: Diagnosis not present

## 2012-07-27 NOTE — Telephone Encounter (Signed)
No - do not take the docusate sodium since she is no longer on the constipating narcotic thanks

## 2012-07-27 NOTE — Telephone Encounter (Signed)
Pt said ED doctor gave Docusate Sodium 100 mg OTC taking one daily. Pt started taking Docusate Sodium 07/26/12. Pt saw Dr Ophelia Charter today and was told not to take the oxycodone because of constipation. Now pt wants to know if Dr Milinda Antis thinks pt should continue Docusate Sodium.  Pt request cb.

## 2012-07-28 NOTE — Telephone Encounter (Signed)
Pt.notified

## 2012-07-30 ENCOUNTER — Telehealth: Payer: Self-pay

## 2012-07-30 NOTE — Telephone Encounter (Signed)
Pt said saw dietician and was told to eat vegetables and fruits for fiber; pt ate 1/2 apple as instructed by dietician and had normal BM. Pt wanted to know if she needed to continue eating apples. Advised pt is ok to eat 1/2 apple every day. Pt will call back if further concerns.

## 2012-08-02 ENCOUNTER — Other Ambulatory Visit: Payer: Self-pay | Admitting: Family Medicine

## 2012-08-02 MED ORDER — HYDROCHLOROTHIAZIDE 12.5 MG PO CAPS: 12.5000 mg | ORAL_CAPSULE | Freq: Every day | ORAL | Status: DC

## 2012-08-02 MED ORDER — OMEPRAZOLE 20 MG PO CPDR: 20.0000 mg | DELAYED_RELEASE_CAPSULE | Freq: Two times a day (BID) | ORAL | Status: DC

## 2012-08-02 MED ORDER — DILTIAZEM HCL ER COATED BEADS 240 MG PO CP24: 240.0000 mg | ORAL_CAPSULE | Freq: Every day | ORAL | Status: DC

## 2012-08-09 ENCOUNTER — Encounter: Payer: Self-pay | Admitting: Family Medicine

## 2012-08-09 ENCOUNTER — Ambulatory Visit (INDEPENDENT_AMBULATORY_CARE_PROVIDER_SITE_OTHER): Payer: Medicare Other | Admitting: Family Medicine

## 2012-08-09 ENCOUNTER — Telehealth: Payer: Self-pay

## 2012-08-09 VITALS — BP 108/68 | HR 105 | Temp 97.8°F | Ht 62.0 in | Wt 199.5 lb

## 2012-08-09 DIAGNOSIS — L259 Unspecified contact dermatitis, unspecified cause: Secondary | ICD-10-CM

## 2012-08-09 DIAGNOSIS — L309 Dermatitis, unspecified: Secondary | ICD-10-CM | POA: Insufficient documentation

## 2012-08-09 NOTE — Telephone Encounter (Signed)
I will see her then  

## 2012-08-09 NOTE — Assessment & Plan Note (Signed)
On chest and neck  There may also be a component of seborrhea Adv to shower at least every other day- continue to wash with ivory soap and dry well  otc cortisone prn  Update if not starting to improve in a week or if worsening

## 2012-08-09 NOTE — Patient Instructions (Addendum)
I think you have some dermatitis on your chest and neck This may be worse because of oily skin and clogged pores Start showering at least every other day and continue using ivory soap  Then use over the counter cortisone cream (the store brand of cort aid is fine )- and use daily until itching is better  If your condition worsens please call and let me know

## 2012-08-09 NOTE — Telephone Encounter (Signed)
Rash on neck started last week, comes and goes; 4-5 red spots on neck with red rash in crease of neck, itching. Pt already has appt today at 2:15; pt will apply ice pack to neck for itching and will keep appt with Dr Milinda Antis.

## 2012-08-09 NOTE — Progress Notes (Signed)
Subjective:    Patient ID: Linda Thompson, female    DOB: 14-Dec-1955, 57 y.o.   MRN: [id number]  HPI Here for a rash Has had itching on her neck for several days -off and on (very mild last week) She has been scratching  No new meds or products - she uses ivory soap and the same shampoo  She showers about 2 times per week and skin is oily   Patient Active Problem List   Diagnosis Date Noted  . Diarrhea 06/23/2012  . Cystitis 06/13/2012  . Right foot pain 05/19/2012  . Right ankle pain 05/19/2012  . Seborrheic keratosis 04/23/2012  . Adverse effects of medication 12/17/2011  . Chronic dermatitis of hands 09/12/2011  . Tinea pedis 07/23/2011  . Obesity 05/05/2011  . Candidal intertrigo 11/15/2010  . Low back pain 09/13/2010  . Irritable bowel syndrome 03/05/2010  . HEART MURMUR 10/12/2009  . TRANSAMINASES, SERUM, ELEVATED 11/22/2007  . DIABETES MELLITUS, TYPE II 08/13/2007  . ALLERGIC  RHINITIS 09/23/2006  . FUNGAL DERMATITIS 08/26/2006  . DERMATITIS, SEBORRHEIC NOS 08/26/2006  . HYPERLIPIDEMIA 03/11/2006  . MENTAL RETARDATION, MODERATE 03/11/2006  . GLAUCOMA NOS 03/11/2006  . HYPERTENSION 03/11/2006  . GERD 03/11/2006  . HIATAL HERNIA 03/11/2006  . PRURITUS ANI 03/11/2006  . OSTEOARTHRITIS, FOOT 03/11/2006  . Personal history of other musculoskeletal disorders(V13.59) 03/11/2006  . MENINGIOMA 12/20/2001   Past Medical History  Diagnosis Date  . Allergic rhinitis, cause unspecified   . Pain in joint, ankle and foot   . Backache, unspecified     chronic LBP with radiculopathy  . Seborrheic dermatitis, unspecified   . Type II or unspecified type diabetes mellitus without mention of complication, not stated as uncontrolled   . History of fracture of arm     Right  . History of fracture of foot   . Dermatomycosis, unspecified   . Esophageal reflux   . Unspecified glaucoma(365.9)   . Undiagnosed cardiac murmurs   . Diaphragmatic hernia without mention of  obstruction or gangrene   . Other and unspecified hyperlipidemia   . Unspecified essential hypertension   . Irritable bowel syndrome   . Benign neoplasm of cerebral meninges   . Moderate intellectual disabilities   . Nausea alone   . Osteoarthrosis, unspecified whether generalized or localized, ankle and foot   . Pruritus ani   . Unspecified tinnitus   . Nonspecific elevation of levels of transaminase or lactic acid dehydrogenase (LDH)   . Leukorrhea, not specified as infective   . Obesity   . Hyperglycemia   . DDD (degenerative disc disease), lumbar   . Yeast infection of the skin     Frequent   Past Surgical History  Procedure Laterality Date  . Total abdominal hysterectomy    . Cataract extraction    . Elbow surgery      right elbow   History  Substance Use Topics  . Smoking status: Never Smoker   . Smokeless tobacco: Never Used  . Alcohol Use: No   Family History  Problem Relation Age of Onset  . Diabetes Mother   . Diabetes Maternal Aunt   . Heart disease Maternal Aunt      (had pacemaker)  . Diabetes Maternal Grandmother   . Stroke Mother   . Stroke Father   . Stroke Maternal Grandmother    Allergies  Allergen Reactions  . Ace Inhibitors     REACTION: COUGH  . Amoxicillin-Pot Clavulanate     REACTION: diarrhea  .  Augmentin (Amoxicillin-Pot Clavulanate) Other (See Comments)  . Azithromycin Diarrhea  . Cyclobenzaprine Hcl     REACTION: diarrhea  . Flexeril (Cyclobenzaprine)   . Flonase (Fluticasone Propionate)     nosebleed  . Ibuprofen     REACTION: vomiting   Current Outpatient Prescriptions on File Prior to Visit  Medication Sig Dispense Refill  . ACCU-CHEK AVIVA PLUS test strip USE EVERY DAY TO CHECK GLUCOSE AND TO CHECK AS NEEDED  100 strip  2  . albuterol (PROVENTIL HFA;VENTOLIN HFA) 108 (90 BASE) MCG/ACT inhaler Inhale 2 puffs into the lungs every 6 (six) hours as needed for wheezing.      Marland Kitchen aspirin 81 MG tablet Take 81 mg by mouth daily.         Marland Kitchen diltiazem (CARDIZEM CD) 240 MG 24 hr capsule Take 1 capsule (240 mg total) by mouth daily.  90 capsule  1  . hydrochlorothiazide (MICROZIDE) 12.5 MG capsule Take 1 capsule (12.5 mg total) by mouth daily.  90 capsule  1  . latanoprost (XALATAN) 0.005 % ophthalmic solution Place 1 drop into both eyes at bedtime.       . metFORMIN (GLUCOPHAGE) 500 MG tablet TAKE 1 TABLET (500 MG TOTAL) BY MOUTH 2 (TWO) TIMES DAILY WITH A MEAL.  180 tablet  1  . omeprazole (PRILOSEC) 20 MG capsule Take 1 capsule (20 mg total) by mouth 2 (two) times daily.  180 capsule  1  . oxyCODONE-acetaminophen (PERCOCET) 5-325 MG per tablet Take 0.5-1 tablets by mouth every 6 (six) hours as needed for pain.      . potassium chloride SA (K-DUR,KLOR-CON) 20 MEQ tablet Take 1 tablet (20 mEq total) by mouth daily.  90 tablet  3  . pravastatin (PRAVACHOL) 40 MG tablet Take 1 tablet (40 mg total) by mouth every evening.  90 tablet  3  . Propylene Glycol (SYSTANE BALANCE OP) Apply 2 drops to eye 2 (two) times daily as needed (dry eyes).       . [DISCONTINUED] Calcium Carbonate-Vitamin D (CALCIUM-VITAMIN D) 500-200 MG-UNIT per tablet Take 1 tablet by mouth 2 (two) times daily with a meal.        . [DISCONTINUED] fexofenadine (ALLEGRA) 180 MG tablet Take 1 tablet (180 mg total) by mouth daily.  15 tablet  0   No current facility-administered medications on file prior to visit.    Review of Systems Review of Systems  Constitutional: Negative for fever, appetite change, fatigue and unexpected weight change.  Eyes: Negative for pain and visual disturbance.  Respiratory: Negative for cough and shortness of breath.   Cardiovascular: Negative for cp or palpitations    Gastrointestinal: Negative for nausea, diarrhea and constipation.  Genitourinary: Negative for urgency and frequency.  Skin: Negative for pallor and pos for rash on neck and chest that is itchy  Neurological: Negative for weakness, light-headedness, numbness and headaches.   Hematological: Negative for adenopathy. Does not bruise/bleed easily.  Psychiatric/Behavioral: Negative for dysphoric mood. The patient is not nervous/anxious.         Objective:   Physical Exam  Constitutional: She appears well-developed and well-nourished. No distress.  Eyes: Conjunctivae and EOM are normal. Pupils are equal, round, and reactive to light.  Neck: Normal range of motion. Neck supple.  Cardiovascular: Normal rate and regular rhythm.   Lymphadenopathy:    She has no cervical adenopathy.  Skin: Skin is warm and dry. Rash noted. There is erythema.  Papules and a few pustules (resembling acne) along with dry  skin seen over neck (especially folds) Overall mild and no excoriations seen  Psychiatric: She has a normal mood and affect.          Assessment & Plan:

## 2012-08-10 ENCOUNTER — Telehealth: Payer: Self-pay

## 2012-08-10 NOTE — Telephone Encounter (Signed)
Pt.notified

## 2012-08-10 NOTE — Telephone Encounter (Signed)
I think eating fresh tomatoes is worth a try - likely will not bother her as much

## 2012-08-10 NOTE — Telephone Encounter (Signed)
Pt said in March ate some spaghetti sauce with a lot of spices and caused stomach upset. Pt wants to know if she can eat fresh tomatoes; pt concerned because tomato has acid it in. Advised pt a fresh tomato does not have added spices and herbs and can probably eat tomato in moderation. Pt wants to be sure it is OK to eat tomatoes.Please advise.

## 2012-08-18 ENCOUNTER — Other Ambulatory Visit: Payer: Self-pay

## 2012-08-18 MED ORDER — KETOCONAZOLE 2 % EX CREA: TOPICAL_CREAM | Freq: Every day | CUTANEOUS | Status: DC

## 2012-08-18 NOTE — Telephone Encounter (Signed)
Rx sent to pharmacy and pt notified  ?

## 2012-08-18 NOTE — Telephone Encounter (Signed)
Pt has 2 available refills on ketoconazole 2 % cream but refills expire on 08/21/12; pt request new rx sent to [facility] to be put on hold so pt will have available if needed.Please advise.pt request cb.

## 2012-08-18 NOTE — Telephone Encounter (Signed)
Please refill times 3 , thanks 

## 2012-08-27 ENCOUNTER — Telehealth: Payer: Self-pay

## 2012-08-27 NOTE — Telephone Encounter (Signed)
Linda Thompson is fine  Do not take it today, however

## 2012-08-27 NOTE — Telephone Encounter (Signed)
Pt came by office; on 08/26/12 pt had outing with neighbor; took Loperamide prior to going out; pt ate chicken pot pie,green beans and mashed potatoes;after 4 pm pt had 3 loose BMs. Pt took another dose of Loperamide last night. Pt feels OK today and no Loose BM today. Advised pt not to take Loreamide today. Pt wanted Dr Royden Purl opinion if she did right thing.Please advise.

## 2012-08-27 NOTE — Telephone Encounter (Signed)
Patient advised.

## 2012-08-28 ENCOUNTER — Emergency Department (HOSPITAL_COMMUNITY)
Admission: EM | Admit: 2012-08-28 | Discharge: 2012-08-28 | Disposition: A | Discharge status: Home / Self Care | Payer: Medicare Other | Attending: Emergency Medicine | Admitting: Emergency Medicine

## 2012-08-28 ENCOUNTER — Encounter (HOSPITAL_COMMUNITY): Payer: Self-pay

## 2012-08-28 DIAGNOSIS — J069 Acute upper respiratory infection, unspecified: Secondary | ICD-10-CM | POA: Diagnosis not present

## 2012-08-28 DIAGNOSIS — Z872 Personal history of diseases of the skin and subcutaneous tissue: Secondary | ICD-10-CM | POA: Insufficient documentation

## 2012-08-28 DIAGNOSIS — Z8781 Personal history of (healed) traumatic fracture: Secondary | ICD-10-CM | POA: Diagnosis not present

## 2012-08-28 DIAGNOSIS — Z8679 Personal history of other diseases of the circulatory system: Secondary | ICD-10-CM | POA: Diagnosis not present

## 2012-08-28 DIAGNOSIS — E1169 Type 2 diabetes mellitus with other specified complication: Secondary | ICD-10-CM | POA: Diagnosis not present

## 2012-08-28 DIAGNOSIS — H409 Unspecified glaucoma: Secondary | ICD-10-CM | POA: Diagnosis not present

## 2012-08-28 DIAGNOSIS — Z8739 Personal history of other diseases of the musculoskeletal system and connective tissue: Secondary | ICD-10-CM | POA: Insufficient documentation

## 2012-08-28 DIAGNOSIS — Z862 Personal history of diseases of the blood and blood-forming organs and certain disorders involving the immune mechanism: Secondary | ICD-10-CM | POA: Diagnosis not present

## 2012-08-28 DIAGNOSIS — K219 Gastro-esophageal reflux disease without esophagitis: Secondary | ICD-10-CM | POA: Insufficient documentation

## 2012-08-28 DIAGNOSIS — Z8639 Personal history of other endocrine, nutritional and metabolic disease: Secondary | ICD-10-CM | POA: Insufficient documentation

## 2012-08-28 NOTE — ED Provider Notes (Signed)
Medical screening examination/treatment/procedure(s) were performed by non-physician practitioner and as supervising physician I was immediately available for consultation/collaboration.  John-Adam Konrad Hoak, M.D.     John-Adam Toshiko Kemler, MD 08/28/12 0408 

## 2012-08-28 NOTE — ED Notes (Signed)
0200  Pt arrived to the ER with sinus pressure that started around 0000 but it has eased off now.

## 2012-08-28 NOTE — ED Provider Notes (Signed)
CSN: [id number]     Arrival date & time 08/28/12  0140 History     First MD Initiated Contact with Patient 08/28/12 0236     Chief Complaint  Patient presents with  . Facial Pain   (Consider location/radiation/quality/duration/timing/severity/associated sxs/prior Treatment) HPI Comments: Patient has had cold.  The nurse help line and I informed her to come to the emergency department by the time.  She arrived here, she was feeling, better. Marland Kitchen  No visual changes some nasal congestion, for the last 1-2, days.  She's been using saline nasal spray, but tonight.She has no other complaints.  No headache, no difficulty swallowing, no cough, no fever, chills, or shortness of breath.   The history is provided by the patient.    Past Medical History  Diagnosis Date  . Allergic rhinitis, cause unspecified   . Pain in joint, ankle and foot   . Backache, unspecified     chronic LBP with radiculopathy  . Seborrheic dermatitis, unspecified   . Type II or unspecified type diabetes mellitus without mention of complication, not stated as uncontrolled   . History of fracture of arm     Right  . History of fracture of foot   . Dermatomycosis, unspecified   . Esophageal reflux   . Unspecified glaucoma(365.9)   . Undiagnosed cardiac murmurs   . Diaphragmatic hernia without mention of obstruction or gangrene   . Other and unspecified hyperlipidemia   . Unspecified essential hypertension   . Irritable bowel syndrome   . Benign neoplasm of cerebral meninges   . Moderate intellectual disabilities   . Nausea alone   . Osteoarthrosis, unspecified whether generalized or localized, ankle and foot   . Pruritus ani   . Unspecified tinnitus   . Nonspecific elevation of levels of transaminase or lactic acid dehydrogenase (LDH)   . Leukorrhea, not specified as infective   . Obesity   . Hyperglycemia   . DDD (degenerative disc disease), lumbar   . Yeast infection of the skin     Frequent   Past Surgical  History  Procedure Laterality Date  . Total abdominal hysterectomy    . Cataract extraction    . Elbow surgery      right elbow   Family History  Problem Relation Age of Onset  . Diabetes Mother   . Diabetes Maternal Aunt   . Heart disease Maternal Aunt      (had pacemaker)  . Diabetes Maternal Grandmother   . Stroke Mother   . Stroke Father   . Stroke Maternal Grandmother    History  Substance Use Topics  . Smoking status: Never Smoker   . Smokeless tobacco: Never Used  . Alcohol Use: No   OB History   Grav Para Term Preterm Abortions TAB SAB Ect Mult Living                 Review of Systems  Constitutional: Negative for fever and chills.  HENT: Positive for congestion and rhinorrhea. Negative for sore throat, neck pain, neck stiffness and ear discharge.   Eyes: Negative for visual disturbance.  Respiratory: Negative for shortness of breath.   Gastrointestinal: Negative for nausea.  Genitourinary: Negative for dysuria.  Neurological: Negative for dizziness and headaches.  All other systems reviewed and are negative.    Allergies  Ace inhibitors; Amoxicillin-pot clavulanate; Augmentin; Azithromycin; Cyclobenzaprine hcl; Flexeril; Flonase; and Ibuprofen  Home Medications   Current Outpatient Rx  Name  Route  Sig  Dispense  Refill  . albuterol (PROVENTIL HFA;VENTOLIN HFA) 108 (90 BASE) MCG/ACT inhaler   Inhalation   Inhale 2 puffs into the lungs every 6 (six) hours as needed for wheezing.         Marland Kitchen aspirin 81 MG tablet   Oral   Take 81 mg by mouth daily.           Marland Kitchen diltiazem (CARDIZEM CD) 240 MG 24 hr capsule   Oral   Take 1 capsule (240 mg total) by mouth daily.   90 capsule   1   . hydrochlorothiazide (MICROZIDE) 12.5 MG capsule   Oral   Take 1 capsule (12.5 mg total) by mouth daily.   90 capsule   1   . ketoconazole (NIZORAL) 2 % cream   Topical   Apply topically daily. As needed to affected area   60 g   2   . latanoprost (XALATAN)  0.005 % ophthalmic solution   Both Eyes   Place 1 drop into both eyes at bedtime.          . metFORMIN (GLUCOPHAGE) 500 MG tablet      TAKE 1 TABLET (500 MG TOTAL) BY MOUTH 2 (TWO) TIMES DAILY WITH A MEAL.   180 tablet   1   . omeprazole (PRILOSEC) 20 MG capsule   Oral   Take 1 capsule (20 mg total) by mouth 2 (two) times daily.   180 capsule   1   . potassium chloride SA (K-DUR,KLOR-CON) 20 MEQ tablet   Oral   Take 1 tablet (20 mEq total) by mouth daily.   90 tablet   3   . pravastatin (PRAVACHOL) 40 MG tablet   Oral   Take 1 tablet (40 mg total) by mouth every evening.   90 tablet   3   . Propylene Glycol (SYSTANE BALANCE OP)   Ophthalmic   Apply 2 drops to eye 2 (two) times daily as needed (dry eyes).          . sodium chloride (OCEAN) 0.65 % nasal spray   Nasal   Place 1 spray into the nose as needed for congestion.          BP 143/93  Pulse 97  Temp(Src) 97.9 F (36.6 C) (Oral)  Resp 18  SpO2 97% Physical Exam  Vitals reviewed. Constitutional: She is oriented to person, place, and time. She appears well-developed and well-nourished.  HENT:  Head: Normocephalic.  Eyes: Pupils are equal, round, and reactive to light.  Neck: Normal range of motion.  Cardiovascular: Normal rate and regular rhythm.   Pulmonary/Chest: Effort normal and breath sounds normal.  Musculoskeletal: Normal range of motion.  Lymphadenopathy:    She has no cervical adenopathy.  Neurological: She is alert and oriented to person, place, and time.  Skin: Skin is warm and dry. No rash noted.    ED Course   Procedures (including critical care time)  Labs Reviewed - No data to display No results found. 1. URI (upper respiratory infection)     MDM   URI pateint to continue to use saline nasal spray   Arman Filter, NP 08/28/12 0252  Arman Filter, NP 08/28/12 [phone]

## 2012-08-30 ENCOUNTER — Telehealth: Payer: Self-pay | Admitting: Family Medicine

## 2012-08-30 ENCOUNTER — Ambulatory Visit (INDEPENDENT_AMBULATORY_CARE_PROVIDER_SITE_OTHER): Payer: Medicare Other | Admitting: Family Medicine

## 2012-08-30 ENCOUNTER — Encounter: Payer: Self-pay | Admitting: Family Medicine

## 2012-08-30 VITALS — BP 122/86 | HR 96 | Temp 97.6°F | Ht 62.0 in | Wt 200.5 lb

## 2012-08-30 DIAGNOSIS — J069 Acute upper respiratory infection, unspecified: Secondary | ICD-10-CM | POA: Diagnosis not present

## 2012-08-30 NOTE — Progress Notes (Signed)
Subjective:    Patient ID: Linda Thompson, female    DOB: 25-Sep-1955, 57 y.o.   MRN: [id number]  HPI Here for uri symptoms She went to the ER over the weekend  Rev her ER notes and nurse call notes   Note indicated URI  Pt thinks she has a sinus infection- no fever however  No antibiotic    No facial pain - but has some pressure in forehead  Stuffy nose  Nasal discharge== clear  A lot of sneezing and throat clear Not using mucinex slt cough-non prod Using nasal saline  Patient Active Problem List   Diagnosis Date Noted  . Dermatitis 08/09/2012  . Diarrhea 06/23/2012  . Cystitis 06/13/2012  . Right foot pain 05/19/2012  . Right ankle pain 05/19/2012  . Seborrheic keratosis 04/23/2012  . Adverse effects of medication 12/17/2011  . Chronic dermatitis of hands 09/12/2011  . Tinea pedis 07/23/2011  . Obesity 05/05/2011  . Candidal intertrigo 11/15/2010  . Low back pain 09/13/2010  . Irritable bowel syndrome 03/05/2010  . HEART MURMUR 10/12/2009  . TRANSAMINASES, SERUM, ELEVATED 11/22/2007  . DIABETES MELLITUS, TYPE II 08/13/2007  . ALLERGIC  RHINITIS 09/23/2006  . FUNGAL DERMATITIS 08/26/2006  . DERMATITIS, SEBORRHEIC NOS 08/26/2006  . HYPERLIPIDEMIA 03/11/2006  . MENTAL RETARDATION, MODERATE 03/11/2006  . GLAUCOMA NOS 03/11/2006  . HYPERTENSION 03/11/2006  . GERD 03/11/2006  . HIATAL HERNIA 03/11/2006  . PRURITUS ANI 03/11/2006  . OSTEOARTHRITIS, FOOT 03/11/2006  . Personal history of other musculoskeletal disorders(V13.59) 03/11/2006  . MENINGIOMA 12/20/2001   Past Medical History  Diagnosis Date  . Allergic rhinitis, cause unspecified   . Pain in joint, ankle and foot   . Backache, unspecified     chronic LBP with radiculopathy  . Seborrheic dermatitis, unspecified   . Type II or unspecified type diabetes mellitus without mention of complication, not stated as uncontrolled   . History of fracture of arm     Right  . History of fracture of foot   .  Dermatomycosis, unspecified   . Esophageal reflux   . Unspecified glaucoma(365.9)   . Undiagnosed cardiac murmurs   . Diaphragmatic hernia without mention of obstruction or gangrene   . Other and unspecified hyperlipidemia   . Unspecified essential hypertension   . Irritable bowel syndrome   . Benign neoplasm of cerebral meninges   . Moderate intellectual disabilities   . Nausea alone   . Osteoarthrosis, unspecified whether generalized or localized, ankle and foot   . Pruritus ani   . Unspecified tinnitus   . Nonspecific elevation of levels of transaminase or lactic acid dehydrogenase (LDH)   . Leukorrhea, not specified as infective   . Obesity   . Hyperglycemia   . DDD (degenerative disc disease), lumbar   . Yeast infection of the skin     Frequent   Past Surgical History  Procedure Laterality Date  . Total abdominal hysterectomy    . Cataract extraction    . Elbow surgery      right elbow   History  Substance Use Topics  . Smoking status: Never Smoker   . Smokeless tobacco: Never Used  . Alcohol Use: No   Family History  Problem Relation Age of Onset  . Diabetes Mother   . Diabetes Maternal Aunt   . Heart disease Maternal Aunt      (had pacemaker)  . Diabetes Maternal Grandmother   . Stroke Mother   . Stroke Father   . Stroke Maternal  Grandmother    Allergies  Allergen Reactions  . Ace Inhibitors     REACTION: COUGH  . Amoxicillin-Pot Clavulanate     REACTION: diarrhea  . Augmentin (Amoxicillin-Pot Clavulanate) Other (See Comments)  . Azithromycin Diarrhea  . Cyclobenzaprine Hcl     REACTION: diarrhea  . Flexeril (Cyclobenzaprine)   . Flonase (Fluticasone Propionate)     nosebleed  . Ibuprofen     REACTION: vomiting   Current Outpatient Prescriptions on File Prior to Visit  Medication Sig Dispense Refill  . albuterol (PROVENTIL HFA;VENTOLIN HFA) 108 (90 BASE) MCG/ACT inhaler Inhale 2 puffs into the lungs every 6 (six) hours as needed for wheezing.       Marland Kitchen aspirin 81 MG tablet Take 81 mg by mouth daily.        Marland Kitchen diltiazem (CARDIZEM CD) 240 MG 24 hr capsule Take 1 capsule (240 mg total) by mouth daily.  90 capsule  1  . hydrochlorothiazide (MICROZIDE) 12.5 MG capsule Take 1 capsule (12.5 mg total) by mouth daily.  90 capsule  1  . ketoconazole (NIZORAL) 2 % cream Apply topically daily. As needed to affected area  60 g  2  . latanoprost (XALATAN) 0.005 % ophthalmic solution Place 1 drop into both eyes at bedtime.       . metFORMIN (GLUCOPHAGE) 500 MG tablet TAKE 1 TABLET (500 MG TOTAL) BY MOUTH 2 (TWO) TIMES DAILY WITH A MEAL.  180 tablet  1  . omeprazole (PRILOSEC) 20 MG capsule Take 1 capsule (20 mg total) by mouth 2 (two) times daily.  180 capsule  1  . potassium chloride SA (K-DUR,KLOR-CON) 20 MEQ tablet Take 1 tablet (20 mEq total) by mouth daily.  90 tablet  3  . pravastatin (PRAVACHOL) 40 MG tablet Take 1 tablet (40 mg total) by mouth every evening.  90 tablet  3  . Propylene Glycol (SYSTANE BALANCE OP) Apply 2 drops to eye 2 (two) times daily as needed (dry eyes).       . sodium chloride (OCEAN) 0.65 % nasal spray Place 1 spray into the nose as needed for congestion.      . [DISCONTINUED] Calcium Carbonate-Vitamin D (CALCIUM-VITAMIN D) 500-200 MG-UNIT per tablet Take 1 tablet by mouth 2 (two) times daily with a meal.        . [DISCONTINUED] fexofenadine (ALLEGRA) 180 MG tablet Take 1 tablet (180 mg total) by mouth daily.  15 tablet  0   No current facility-administered medications on file prior to visit.    Review of Systems Review of Systems  Constitutional: Negative for fever, appetite change,  and unexpected weight change.  ENT pos for congestion and head pressure , neg for ear pain or st Eyes: Negative for pain and visual disturbance.  Respiratory: Negative for wheeze and shortness of breath.   Cardiovascular: Negative for cp or palpitations    Gastrointestinal: Negative for nausea, diarrhea and constipation.  Genitourinary:  Negative for urgency and frequency.  Skin: Negative for pallor or rash   Neurological: Negative for weakness, light-headedness, numbness and headaches.  Hematological: Negative for adenopathy. Does not bruise/bleed easily.  Psychiatric/Behavioral: Negative for dysphoric mood. The patient is not nervous/anxious.         Objective:   Physical Exam  Constitutional: She appears well-developed and well-nourished. No distress.  obese and well appearing   HENT:  Head: Normocephalic and atraumatic.  Right Ear: External ear normal.  Left Ear: External ear normal.  Mouth/Throat: Oropharynx is clear and moist.  Nares are injected and congested   No sinus tenderness Throat is clear   Clear rhinorrhea   Eyes: Conjunctivae and EOM are normal. Pupils are equal, round, and reactive to light. Right eye exhibits no discharge. Left eye exhibits no discharge.  Neck: Normal range of motion. Neck supple.  Cardiovascular: Normal rate, regular rhythm and normal heart sounds.  Exam reveals no gallop.   Rate in high 80s at rest  Pulmonary/Chest: Effort normal and breath sounds normal. No respiratory distress. She has no wheezes. She has no rales.  No rhonchi  Lymphadenopathy:    She has no cervical adenopathy.  Neurological: She is alert. She has normal reflexes. No cranial nerve deficit. She exhibits normal muscle tone. Coordination normal.  Skin: Skin is warm and dry. No rash noted.  Psychiatric: She has a normal mood and affect.  Baseline MR- she repeats herself often          Assessment & Plan:

## 2012-08-30 NOTE — Patient Instructions (Addendum)
I think you have a viral cold - not a sinus infection at this point Drink fluids Use nasal saline and losenges If you are very congested - then you can also try mucinex (plain) one pill twice daily If you develop facial pain/ fever and green nasal drainage please let me know

## 2012-08-30 NOTE — Telephone Encounter (Signed)
[facility] Triage Call Report Triage Record Num: [id number] Operator: Linda Thompson Patient Name: Linda Thompson Call Date & Time: 08/28/2012 12:48:34AM Patient Phone: [phone] PCP: Audrie Gallus. Tower Patient Gender: Female PCP Fax : Patient DOB: October 09, 1955 Practice Name: [facility] Reason for Call: Linda Thompson calling about upper resp symptoms. Onset 08/27/12. Coughing, congestion. Breathing well. Saline nasal spray used. Pain over forehead. See within 24hours. Home care adivice given per Diabetes Resp Protocol. Pt to f/u in am for continued symptoms. Protocol(s) Used: Diabetes: Respiratory Problems Recommended Outcome per Protocol: See Provider within 24 hours Reason for Outcome: Pressure/pain above or below eyes, near ears over sinuses (may also be described as fullness, worsens when bending forward, teeth or eye pain) AND yellow-green drainage from nose or down back of throat. Care Advice: ~ Use a cool mist humidifier to moisten air. Be sure to clean according to manufacturer's instructions. Follow the usual meal plan if possible. Drink extra non-caloric fluids. If unable to eat at all, drink regular soft drinks and juices so that 50 grams of carbohydrates are taken in every 3 to 4 hours. ~ Diabetics should not take ibuprofen unless approved by a provider. Drugs like acetaminophen or aspirin can be used for pain or fever if they are part of the sick day action plan. ~ ~ Call provider if new swelling of eyelids, or fever, or worsening of symptoms. ~ SYMPTOM / CONDITION MANAGEMENT A warm, moist compress placed on face, over eyes for 15 to 20 minutes, 5 to 6 times a day, may help relieve the congestion. ~ To help relieve nasal congestion, use nonprescription saline nasal spray according to label instructions or as directed by a healthcare provider. ~ Diabetes Action Plan: - Follow recommended action plan and diet - Check blood sugar as recommended - Take diabetic pills or  insulin as ordered - Follow action plan for illness. ~ Nasal Irrigation To Relieve Congestion: - Wash hands with soap and water. - Add 1/2 teaspoon salt to 1 cup warm water to make saltwater solution. - Use a bulb syringe to draw up the saltwater solution. Turn the bulb upright to squeeze out any air. Fill the syringe until is full. - Bend over sink bowl and lean slightly toward sink. Gently insert the tip of the syringe into nostril about 1/2 inch. Point tip toward outer corner of eye and GENTLY squeeze bulb to squirt saltwater into nostril. Forceful irrigation to clear sinuses requires the use of sterile water or saline. - Let solution drain from nose. Solution may come out of the other nostril or even your mouth. Do two full syringes of solution in each nostril. - Rinse syringe in clean water several times. Be sure water comes out clear. Dry the bulb syringe and store in a container or plastic bag. ~ Total water intake includes drinking water, water in beverages, and water contained in food. Fluids make up about 80% of the body's total hydration need. Individual fluid requirement to maintain hydration vary based on physical activity, environmental factors and illness. Limit fluids that contain sugar, caffeine, or alcohol. Urine will be very light ~ 08/28/2012 1:01:54AM Page 1 of 2 [email or web address] [facility] Triage Call Report Patient Name: Linda Thompson continuation page/s yellow color when you drink enough fluids. 08/28/2012 1:01:54AM Page 2 of 2 [email or web address]_[email or web address]_[email or web address]

## 2012-08-30 NOTE — Assessment & Plan Note (Signed)
I do not feel that she has a bacterial sinus infection Disc symptomatic care - see instructions on AVS  She can try mucinex also for congestion Update if not starting to improve in a week or if worsening  - or if ha/ fever or purulent nasal d/c Handout given

## 2012-09-01 ENCOUNTER — Telehealth: Payer: Self-pay

## 2012-09-01 NOTE — Telephone Encounter (Signed)
No, she can stop it after that

## 2012-09-01 NOTE — Telephone Encounter (Signed)
Pt left note pt has been taking Mucinex DM and is feeling better; pt has 4 pills left and wants to know when she finishes this med does she need to continue medication; pt said med is expensive.Please advise.

## 2012-09-02 ENCOUNTER — Telehealth: Payer: Self-pay | Admitting: Family Medicine

## 2012-09-02 NOTE — Telephone Encounter (Signed)
Pt.notified

## 2012-09-02 NOTE — Telephone Encounter (Signed)
[facility] Triage Call Report Triage Record Num: [id number] Operator: Albertine Grates Patient Name: Linda Thompson Call Date & Time: 09/01/2012 10:42:17PM Patient Phone: [phone] PCP: Audrie Gallus. Tower Patient Gender: Female PCP Fax : Patient DOB: 04/09/1955 Practice Name: [facility] Reason for Call: Caller: [place]/Patient; PCP: [name]; CB#: [phone]; Was seen at ER 8-9 and diagnosed with "cold and sinuses". No medicine was prescribed. Was seen in offince 8-11. Was diagnosed with viral illness/cold. Has sore throat since 8-13. Afebrile. Is drinking fluids. Home care advice given per Sore Throat protocol. Protocol(s) Used: Sore Throat or Hoarseness Recommended Outcome per Protocol: Provide Home/Self Care Reason for Outcome: Sore throat with negative rapid strep test and negative throat culture Care Advice: Sore Throat Relief: - Use salt water gargles (1/2 teaspoon salt in 8 oz. [260mL] warm water) every one to two hours. - Use a vaporizer or cool mist humidifier in the room when sleeping. - Suck on hard candy, nonprescription or herbal throat lozenges (sugar-free if diabetic) - Eat soothing, soft food/fluids (broths, soups, or honey and lemon juice in hot tea, Popsicles, frozen yogurt or sherbet, scrambled eggs, cooked cereals, Jell-O or puddings) whichever is most comforting. - Avoid eating salty, spicy or acidic foods. ~ 09/01/2012 11:03:56PM Page 1 of 1 [email or web address]_[email or web address]

## 2012-09-17 ENCOUNTER — Encounter: Payer: Self-pay | Admitting: Family Medicine

## 2012-09-17 ENCOUNTER — Ambulatory Visit (INDEPENDENT_AMBULATORY_CARE_PROVIDER_SITE_OTHER): Payer: Medicare Other | Admitting: Family Medicine

## 2012-09-17 VITALS — BP 124/68 | HR 97 | Temp 98.0°F | Ht 62.0 in | Wt 206.5 lb

## 2012-09-17 DIAGNOSIS — Z23 Encounter for immunization: Secondary | ICD-10-CM | POA: Diagnosis not present

## 2012-09-17 DIAGNOSIS — J069 Acute upper respiratory infection, unspecified: Secondary | ICD-10-CM | POA: Diagnosis not present

## 2012-09-17 NOTE — Progress Notes (Signed)
Subjective:    Patient ID: Linda Thompson, female    DOB: 20-Jun-1955, 57 y.o.   MRN: [id number]  HPI Here with uri symptoms  Her throat feels scratchy and she has post nasal drip  A bit of a cough-had some phlegm come up -- it was clear No fever Some runny nose   Wants to get her flu shot today since she does not have a fever   We have paperwork for DM foot supplies - she does not think she needs it - will take forms to her podiatry visit  She has bunions and goes to pod for nail trim at end of sept   No otc meds for the symptoms Drinks lots of water   Patient Active Problem List   Diagnosis Date Noted  . Viral URI 08/30/2012  . Dermatitis 08/09/2012  . Diarrhea 06/23/2012  . Cystitis 06/13/2012  . Right foot pain 05/19/2012  . Right ankle pain 05/19/2012  . Seborrheic keratosis 04/23/2012  . Adverse effects of medication 12/17/2011  . Chronic dermatitis of hands 09/12/2011  . Tinea pedis 07/23/2011  . Obesity 05/05/2011  . Candidal intertrigo 11/15/2010  . Low back pain 09/13/2010  . Irritable bowel syndrome 03/05/2010  . HEART MURMUR 10/12/2009  . TRANSAMINASES, SERUM, ELEVATED 11/22/2007  . DIABETES MELLITUS, TYPE II 08/13/2007  . ALLERGIC  RHINITIS 09/23/2006  . FUNGAL DERMATITIS 08/26/2006  . DERMATITIS, SEBORRHEIC NOS 08/26/2006  . HYPERLIPIDEMIA 03/11/2006  . MENTAL RETARDATION, MODERATE 03/11/2006  . GLAUCOMA NOS 03/11/2006  . HYPERTENSION 03/11/2006  . GERD 03/11/2006  . HIATAL HERNIA 03/11/2006  . PRURITUS ANI 03/11/2006  . OSTEOARTHRITIS, FOOT 03/11/2006  . Personal history of other musculoskeletal disorders(V13.59) 03/11/2006  . MENINGIOMA 12/20/2001   Past Medical History  Diagnosis Date  . Allergic rhinitis, cause unspecified   . Pain in joint, ankle and foot   . Backache, unspecified     chronic LBP with radiculopathy  . Seborrheic dermatitis, unspecified   . Type II or unspecified type diabetes mellitus without mention of complication, not  stated as uncontrolled   . History of fracture of arm     Right  . History of fracture of foot   . Dermatomycosis, unspecified   . Esophageal reflux   . Unspecified glaucoma(365.9)   . Undiagnosed cardiac murmurs   . Diaphragmatic hernia without mention of obstruction or gangrene   . Other and unspecified hyperlipidemia   . Unspecified essential hypertension   . Irritable bowel syndrome   . Benign neoplasm of cerebral meninges   . Moderate intellectual disabilities   . Nausea alone   . Osteoarthrosis, unspecified whether generalized or localized, ankle and foot   . Pruritus ani   . Unspecified tinnitus   . Nonspecific elevation of levels of transaminase or lactic acid dehydrogenase (LDH)   . Leukorrhea, not specified as infective   . Obesity   . Hyperglycemia   . DDD (degenerative disc disease), lumbar   . Yeast infection of the skin     Frequent   Past Surgical History  Procedure Laterality Date  . Total abdominal hysterectomy    . Cataract extraction    . Elbow surgery      right elbow   History  Substance Use Topics  . Smoking status: Never Smoker   . Smokeless tobacco: Never Used  . Alcohol Use: No   Family History  Problem Relation Age of Onset  . Diabetes Mother   . Diabetes Maternal Aunt   .  Heart disease Maternal Aunt      (had pacemaker)  . Diabetes Maternal Grandmother   . Stroke Mother   . Stroke Father   . Stroke Maternal Grandmother    Allergies  Allergen Reactions  . Ace Inhibitors     REACTION: COUGH  . Amoxicillin-Pot Clavulanate     REACTION: diarrhea  . Augmentin [Amoxicillin-Pot Clavulanate] Other (See Comments)  . Azithromycin Diarrhea  . Cyclobenzaprine Hcl     REACTION: diarrhea  . Flexeril [Cyclobenzaprine]   . Flonase [Fluticasone Propionate]     nosebleed  . Ibuprofen     REACTION: vomiting   Current Outpatient Prescriptions on File Prior to Visit  Medication Sig Dispense Refill  . albuterol (PROVENTIL HFA;VENTOLIN HFA) 108  (90 BASE) MCG/ACT inhaler Inhale 2 puffs into the lungs every 6 (six) hours as needed for wheezing.      Marland Kitchen aspirin 81 MG tablet Take 81 mg by mouth daily.        Marland Kitchen diltiazem (CARDIZEM CD) 240 MG 24 hr capsule Take 1 capsule (240 mg total) by mouth daily.  90 capsule  1  . hydrochlorothiazide (MICROZIDE) 12.5 MG capsule Take 1 capsule (12.5 mg total) by mouth daily.  90 capsule  1  . ketoconazole (NIZORAL) 2 % cream Apply topically daily. As needed to affected area  60 g  2  . latanoprost (XALATAN) 0.005 % ophthalmic solution Place 1 drop into both eyes at bedtime.       . metFORMIN (GLUCOPHAGE) 500 MG tablet TAKE 1 TABLET (500 MG TOTAL) BY MOUTH 2 (TWO) TIMES DAILY WITH A MEAL.  180 tablet  1  . omeprazole (PRILOSEC) 20 MG capsule Take 1 capsule (20 mg total) by mouth 2 (two) times daily.  180 capsule  1  . potassium chloride SA (K-DUR,KLOR-CON) 20 MEQ tablet Take 1 tablet (20 mEq total) by mouth daily.  90 tablet  3  . pravastatin (PRAVACHOL) 40 MG tablet Take 1 tablet (40 mg total) by mouth every evening.  90 tablet  3  . Propylene Glycol (SYSTANE BALANCE OP) Apply 2 drops to eye 2 (two) times daily as needed (dry eyes).       . sodium chloride (OCEAN) 0.65 % nasal spray Place 1 spray into the nose as needed for congestion.      . [DISCONTINUED] Calcium Carbonate-Vitamin D (CALCIUM-VITAMIN D) 500-200 MG-UNIT per tablet Take 1 tablet by mouth 2 (two) times daily with a meal.        . [DISCONTINUED] fexofenadine (ALLEGRA) 180 MG tablet Take 1 tablet (180 mg total) by mouth daily.  15 tablet  0   No current facility-administered medications on file prior to visit.    Review of Systems Review of Systems  Constitutional: Negative for fever, appetite change, fatigue and unexpected weight change.  ENT pos for some post nasal drip/ rhinorrhea and ST, neg for ear pain  Eyes: Negative for pain and visual disturbance.  Respiratory: Negative for wheeze  and shortness of breath.   Cardiovascular:  Negative for cp or palpitations    Gastrointestinal: Negative for nausea, diarrhea and constipation.  Genitourinary: Negative for urgency and frequency.  Skin: Negative for pallor or rash   Neurological: Negative for weakness, light-headedness, numbness and headaches.  Hematological: Negative for adenopathy. Does not bruise/bleed easily.  Psychiatric/Behavioral: Negative for dysphoric mood. The patient is not nervous/anxious.         Objective:   Physical Exam  Constitutional: She appears well-developed and well-nourished. No distress.  HENT:  Head: Normocephalic and atraumatic.  Right Ear: External ear normal.  Left Ear: External ear normal.  Mouth/Throat: No oropharyngeal exudate.  Nares are boggy with clear rhinorrhea  No sinus tenderness  Throat clear  Eyes: Conjunctivae and EOM are normal. Pupils are equal, round, and reactive to light. Right eye exhibits no discharge. Left eye exhibits no discharge. No scleral icterus.  Neck: Normal range of motion. Neck supple. No thyromegaly present.  Cardiovascular: Normal rate and regular rhythm.   Pulmonary/Chest: Effort normal and breath sounds normal. No respiratory distress. She has no wheezes. She has no rales. She exhibits no tenderness.  Musculoskeletal: She exhibits no edema.  Lymphadenopathy:    She has no cervical adenopathy.  Neurological: She is alert. She has normal reflexes.  Skin: Skin is warm and dry. No rash noted.  Psychiatric: She has a normal mood and affect.  Baseline MR She repeats herself frequently pleasant          Assessment & Plan:

## 2012-09-17 NOTE — Patient Instructions (Addendum)
I think you have a mild cold  Please drink fluids and get extra rest  If fever or if worsened cough or other new symptoms please let me know (you can talk to the triage nurse on call)  Flu shot today

## 2012-09-19 NOTE — Assessment & Plan Note (Signed)
Mild with rhinorrhea Disc symptomatic care - see instructions on AVS Update if not starting to improve in a week or if worsening   Given easy to read handout on viral resp infx

## 2012-09-23 DIAGNOSIS — H1045 Other chronic allergic conjunctivitis: Secondary | ICD-10-CM | POA: Diagnosis not present

## 2012-09-23 DIAGNOSIS — E119 Type 2 diabetes mellitus without complications: Secondary | ICD-10-CM | POA: Diagnosis not present

## 2012-09-23 DIAGNOSIS — Z961 Presence of intraocular lens: Secondary | ICD-10-CM | POA: Diagnosis not present

## 2012-09-23 DIAGNOSIS — H4011X0 Primary open-angle glaucoma, stage unspecified: Secondary | ICD-10-CM | POA: Diagnosis not present

## 2012-09-23 DIAGNOSIS — H04129 Dry eye syndrome of unspecified lacrimal gland: Secondary | ICD-10-CM | POA: Diagnosis not present

## 2012-10-07 ENCOUNTER — Emergency Department (HOSPITAL_COMMUNITY)
Admission: EM | Admit: 2012-10-07 | Discharge: 2012-10-07 | Disposition: A | Discharge status: Home / Self Care | Payer: Medicare Other | Attending: Emergency Medicine | Admitting: Emergency Medicine

## 2012-10-07 ENCOUNTER — Encounter (HOSPITAL_COMMUNITY): Payer: Self-pay | Admitting: Emergency Medicine

## 2012-10-07 DIAGNOSIS — I10 Essential (primary) hypertension: Secondary | ICD-10-CM | POA: Diagnosis not present

## 2012-10-07 DIAGNOSIS — H409 Unspecified glaucoma: Secondary | ICD-10-CM | POA: Diagnosis not present

## 2012-10-07 DIAGNOSIS — E669 Obesity, unspecified: Secondary | ICD-10-CM | POA: Insufficient documentation

## 2012-10-07 DIAGNOSIS — Z8781 Personal history of (healed) traumatic fracture: Secondary | ICD-10-CM | POA: Insufficient documentation

## 2012-10-07 DIAGNOSIS — E119 Type 2 diabetes mellitus without complications: Secondary | ICD-10-CM | POA: Diagnosis not present

## 2012-10-07 DIAGNOSIS — Z79899 Other long term (current) drug therapy: Secondary | ICD-10-CM | POA: Insufficient documentation

## 2012-10-07 DIAGNOSIS — Z7982 Long term (current) use of aspirin: Secondary | ICD-10-CM | POA: Diagnosis not present

## 2012-10-07 DIAGNOSIS — R011 Cardiac murmur, unspecified: Secondary | ICD-10-CM | POA: Insufficient documentation

## 2012-10-07 DIAGNOSIS — Z8742 Personal history of other diseases of the female genital tract: Secondary | ICD-10-CM | POA: Insufficient documentation

## 2012-10-07 DIAGNOSIS — J309 Allergic rhinitis, unspecified: Secondary | ICD-10-CM | POA: Insufficient documentation

## 2012-10-07 DIAGNOSIS — E785 Hyperlipidemia, unspecified: Secondary | ICD-10-CM | POA: Diagnosis not present

## 2012-10-07 DIAGNOSIS — K219 Gastro-esophageal reflux disease without esophagitis: Secondary | ICD-10-CM | POA: Insufficient documentation

## 2012-10-07 DIAGNOSIS — Z872 Personal history of diseases of the skin and subcutaneous tissue: Secondary | ICD-10-CM | POA: Insufficient documentation

## 2012-10-07 DIAGNOSIS — Z8619 Personal history of other infectious and parasitic diseases: Secondary | ICD-10-CM | POA: Diagnosis not present

## 2012-10-07 DIAGNOSIS — R05 Cough: Secondary | ICD-10-CM | POA: Diagnosis not present

## 2012-10-07 DIAGNOSIS — M19079 Primary osteoarthritis, unspecified ankle and foot: Secondary | ICD-10-CM | POA: Insufficient documentation

## 2012-10-07 NOTE — ED Provider Notes (Signed)
CSN: [id number]     Arrival date & time 10/07/12  0037 History   First MD Initiated Contact with Patient 10/07/12 0205     Chief Complaint  Patient presents with  . Nasal Congestion   (Consider location/radiation/quality/duration/timing/severity/associated sxs/prior Treatment) The history is provided by the patient and medical records. No language interpreter was used.    Linda Thompson is a 57 y.o. female  with a hx of allergic rhinitis, GERD presents to the Emergency Department complaining of gradual, persistent, progressively worsening nasal congestion onset midnight. Associated symptoms include coughing with post nasal drip.  Pt reports she used some saline nasal spray before coming and is feeling much better.  Saline makes it better and nothing makes it worse.  Pt denies fever, chills, headache, neck pain, epistaxis, syncope, abd pain, N/V/D.     Past Medical History  Diagnosis Date  . Allergic rhinitis, cause unspecified   . Pain in joint, ankle and foot   . Backache, unspecified     chronic LBP with radiculopathy  . Seborrheic dermatitis, unspecified   . Type II or unspecified type diabetes mellitus without mention of complication, not stated as uncontrolled   . History of fracture of arm     Right  . History of fracture of foot   . Dermatomycosis, unspecified   . Esophageal reflux   . Unspecified glaucoma(365.9)   . Undiagnosed cardiac murmurs   . Diaphragmatic hernia without mention of obstruction or gangrene   . Other and unspecified hyperlipidemia   . Unspecified essential hypertension   . Irritable bowel syndrome   . Benign neoplasm of cerebral meninges   . Moderate intellectual disabilities   . Nausea alone   . Osteoarthrosis, unspecified whether generalized or localized, ankle and foot   . Pruritus ani   . Unspecified tinnitus   . Nonspecific elevation of levels of transaminase or lactic acid dehydrogenase (LDH)   . Leukorrhea, not specified as infective   .  Obesity   . Hyperglycemia   . DDD (degenerative disc disease), lumbar   . Yeast infection of the skin     Frequent   Past Surgical History  Procedure Laterality Date  . Total abdominal hysterectomy    . Cataract extraction    . Elbow surgery      right elbow   Family History  Problem Relation Age of Onset  . Diabetes Mother   . Diabetes Maternal Aunt   . Heart disease Maternal Aunt      (had pacemaker)  . Diabetes Maternal Grandmother   . Stroke Mother   . Stroke Father   . Stroke Maternal Grandmother    History  Substance Use Topics  . Smoking status: Never Smoker   . Smokeless tobacco: Never Used  . Alcohol Use: No   OB History   Grav Para Term Preterm Abortions TAB SAB Ect Mult Living                 Review of Systems  Constitutional: Negative for fever, diaphoresis, appetite change, fatigue and unexpected weight change.  HENT: Positive for congestion, rhinorrhea, sneezing, postnasal drip and sinus pressure. Negative for nosebleeds, sore throat, mouth sores, trouble swallowing, neck pain and neck stiffness.   Eyes: Negative for visual disturbance.  Respiratory: Positive for cough. Negative for chest tightness, shortness of breath and wheezing.   Cardiovascular: Negative for chest pain.  Gastrointestinal: Negative for nausea, vomiting, abdominal pain, diarrhea and constipation.  Endocrine: Negative for polydipsia, polyphagia and polyuria.  Genitourinary: Negative for dysuria, urgency, frequency and hematuria.  Musculoskeletal: Negative for back pain.  Skin: Negative for rash.  Allergic/Immunologic: Negative for immunocompromised state.  Neurological: Negative for syncope, light-headedness and headaches.  Hematological: Does not bruise/bleed easily.  Psychiatric/Behavioral: Negative for sleep disturbance. The patient is not nervous/anxious.     Allergies  Ace inhibitors; Amoxicillin-pot clavulanate; Augmentin; Azithromycin; Cyclobenzaprine hcl; Flexeril; Flonase;  and Ibuprofen  Home Medications   Current Outpatient Rx  Name  Route  Sig  Dispense  Refill  . aspirin 81 MG tablet   Oral   Take 81 mg by mouth daily.           Marland Kitchen desonide (DESOWEN) 0.05 % ointment   Topical   Apply 1 application topically 2 (two) times daily.         Marland Kitchen diltiazem (CARDIZEM CD) 240 MG 24 hr capsule   Oral   Take 1 capsule (240 mg total) by mouth daily.   90 capsule   1   . hydrochlorothiazide (MICROZIDE) 12.5 MG capsule   Oral   Take 1 capsule (12.5 mg total) by mouth daily.   90 capsule   1   . ketoconazole (NIZORAL) 2 % shampoo   Topical   Apply 1 application topically 2 (two) times a week.         . latanoprost (XALATAN) 0.005 % ophthalmic solution   Both Eyes   Place 1 drop into both eyes at bedtime.          . metFORMIN (GLUCOPHAGE) 500 MG tablet   Oral   Take 500 mg by mouth 2 (two) times daily with a meal.         . omeprazole (PRILOSEC) 20 MG capsule   Oral   Take 1 capsule (20 mg total) by mouth 2 (two) times daily.   180 capsule   1   . potassium chloride SA (K-DUR,KLOR-CON) 20 MEQ tablet   Oral   Take 1 tablet (20 mEq total) by mouth daily.   90 tablet   3   . pravastatin (PRAVACHOL) 40 MG tablet   Oral   Take 1 tablet (40 mg total) by mouth every evening.   90 tablet   3   . Propylene Glycol (SYSTANE BALANCE OP)   Ophthalmic   Apply 2 drops to eye 2 (two) times daily as needed (dry eyes).          . sodium chloride (OCEAN) 0.65 % nasal spray   Nasal   Place 1 spray into the nose as needed for congestion.          BP 144/80  Pulse 100  Temp(Src) 97.8 F (36.6 C) (Oral)  Resp 20  SpO2 95% Physical Exam  Nursing note and vitals reviewed. Constitutional: She is oriented to person, place, and time. She appears well-developed and well-nourished. No distress.  Awake, alert, nontoxic appearance  HENT:  Head: Normocephalic and atraumatic.  Right Ear: Tympanic membrane, external ear and ear canal normal.   Left Ear: Tympanic membrane, external ear and ear canal normal.  Nose: Mucosal edema (mild) and rhinorrhea present. No epistaxis. Right sinus exhibits no maxillary sinus tenderness and no frontal sinus tenderness. Left sinus exhibits no maxillary sinus tenderness and no frontal sinus tenderness.  Mouth/Throat: Uvula is midline, oropharynx is clear and moist and mucous membranes are normal. Mucous membranes are not pale and not cyanotic. No oropharyngeal exudate, posterior oropharyngeal edema, posterior oropharyngeal erythema or tonsillar abscesses.  Eyes: Conjunctivae are normal.  Pupils are equal, round, and reactive to light. No scleral icterus.  Neck: Normal range of motion and full passive range of motion without pain. Neck supple.  Cardiovascular: Normal rate, regular rhythm and intact distal pulses.   Pulmonary/Chest: Effort normal and breath sounds normal. No stridor. No respiratory distress. She has no wheezes.  Abdominal: Soft. Bowel sounds are normal. She exhibits no distension. There is no tenderness.  Musculoskeletal: Normal range of motion. She exhibits no edema.  Lymphadenopathy:    She has no cervical adenopathy.  Neurological: She is alert and oriented to person, place, and time. She exhibits normal muscle tone. Coordination normal.  Speech is clear and goal oriented Moves extremities without ataxia  Skin: Skin is warm and dry. No rash noted. She is not diaphoretic. No erythema.  Psychiatric: She has a normal mood and affect. Her behavior is normal.    ED Course  Procedures (including critical care time) Labs Review Labs Reviewed - No data to display Imaging Review No results found.  MDM   1. Allergic rhinitis      Linda Thompson presents with nasal congestion.  Patient complaining of symptoms of sinusitis.  Mild symptoms of clear nasal discharge/congestion and scratchy throat with cough for less than 10 hours.  Patient is afebrile.  No concern for acute bacterial  rhinosinusitis; likely viral in nature vs exacerbation of her allergic rhinitis.  Patient discharged with symptomatic treatment with warm saline nasal washes.  Recommendations for follow-up with primary care physician.    It has been determined that no acute conditions requiring further emergency intervention are present at this time. The patient/guardian have been advised of the diagnosis and plan. We have discussed signs and symptoms that warrant return to the ED, such as changes or worsening in symptoms.   Vital signs are stable at discharge. Pt initially tachycardic at triage, but no tachycardia on my exam.  BP 144/80  Pulse 100  Temp(Src) 97.8 F (36.6 C) (Oral)  Resp 20  SpO2 95%  Patient/guardian has voiced understanding and agreed to follow-up with the PCP or specialist.         Dierdre Forth, PA-C 10/07/12 0354

## 2012-10-07 NOTE — ED Notes (Signed)
Pt states she has had nasal drainage and sinus pressure. Saw PCP for it and was prescribed Saline nasal spray. States that she coughed up clear phlegm yesterday and sneezed really hard and now feels better but still wants to get evaluated.

## 2012-10-07 NOTE — ED Provider Notes (Signed)
Medical screening examination/treatment/procedure(s) were performed by non-physician practitioner and as supervising physician I was immediately available for consultation/collaboration.   Numair Masden, MD 10/07/12 2235 

## 2012-10-07 NOTE — ED Notes (Signed)
Pt. reports nasal congestion and sinus pressure onset yesterday with occasional dry cough.

## 2012-10-08 ENCOUNTER — Ambulatory Visit (INDEPENDENT_AMBULATORY_CARE_PROVIDER_SITE_OTHER): Payer: Medicare Other | Admitting: Family Medicine

## 2012-10-08 ENCOUNTER — Other Ambulatory Visit: Payer: Self-pay | Admitting: *Deleted

## 2012-10-08 ENCOUNTER — Encounter: Payer: Self-pay | Admitting: Family Medicine

## 2012-10-08 VITALS — BP 110/82 | HR 106 | Temp 98.6°F | Ht 62.0 in | Wt 201.0 lb

## 2012-10-08 DIAGNOSIS — L608 Other nail disorders: Secondary | ICD-10-CM | POA: Diagnosis not present

## 2012-10-08 DIAGNOSIS — J309 Allergic rhinitis, unspecified: Secondary | ICD-10-CM | POA: Diagnosis not present

## 2012-10-08 DIAGNOSIS — E1149 Type 2 diabetes mellitus with other diabetic neurological complication: Secondary | ICD-10-CM | POA: Diagnosis not present

## 2012-10-08 MED ORDER — DILTIAZEM HCL ER COATED BEADS 240 MG PO CP24: 240.0000 mg | ORAL_CAPSULE | Freq: Every day | ORAL | Status: DC

## 2012-10-08 NOTE — Patient Instructions (Addendum)
I think you are having seasonal allergies- less likely an early cold Drink fluids and use nasal saline as needed  Get claritin 10 mg over the counter and take 1 pill daily as needed for sneezing and runny nose and post nasal drip (you can use the store brand) Update if symptoms get much worse

## 2012-10-08 NOTE — Progress Notes (Signed)
Subjective:    Patient ID: Linda Thompson, female    DOB: 1956/01/11, 57 y.o.   MRN: [id number]  HPI Here for ER f/u for nasal cong thought to be due to allergies She states she also had a non prod cough No fever  No ear pain or throat pain  She had improved with nasal saline spray   Today she feels a lot better   No medicines given  No claritin lately   Now some sneezing and post nasal drip  All clear   Patient Active Problem List   Diagnosis Date Noted  . Viral URI 08/30/2012  . Dermatitis 08/09/2012  . Diarrhea 06/23/2012  . Cystitis 06/13/2012  . Right foot pain 05/19/2012  . Right ankle pain 05/19/2012  . Seborrheic keratosis 04/23/2012  . Adverse effects of medication 12/17/2011  . Chronic dermatitis of hands 09/12/2011  . Tinea pedis 07/23/2011  . Obesity 05/05/2011  . Candidal intertrigo 11/15/2010  . Low back pain 09/13/2010  . Irritable bowel syndrome 03/05/2010  . HEART MURMUR 10/12/2009  . TRANSAMINASES, SERUM, ELEVATED 11/22/2007  . DIABETES MELLITUS, TYPE II 08/13/2007  . ALLERGIC  RHINITIS 09/23/2006  . FUNGAL DERMATITIS 08/26/2006  . DERMATITIS, SEBORRHEIC NOS 08/26/2006  . HYPERLIPIDEMIA 03/11/2006  . MENTAL RETARDATION, MODERATE 03/11/2006  . GLAUCOMA NOS 03/11/2006  . HYPERTENSION 03/11/2006  . GERD 03/11/2006  . HIATAL HERNIA 03/11/2006  . PRURITUS ANI 03/11/2006  . OSTEOARTHRITIS, FOOT 03/11/2006  . Personal history of other musculoskeletal disorders(V13.59) 03/11/2006  . MENINGIOMA 12/20/2001   Past Medical History  Diagnosis Date  . Allergic rhinitis, cause unspecified   . Pain in joint, ankle and foot   . Backache, unspecified     chronic LBP with radiculopathy  . Seborrheic dermatitis, unspecified   . Type II or unspecified type diabetes mellitus without mention of complication, not stated as uncontrolled   . History of fracture of arm     Right  . History of fracture of foot   . Dermatomycosis, unspecified   . Esophageal  reflux   . Unspecified glaucoma(365.9)   . Undiagnosed cardiac murmurs   . Diaphragmatic hernia without mention of obstruction or gangrene   . Other and unspecified hyperlipidemia   . Unspecified essential hypertension   . Irritable bowel syndrome   . Benign neoplasm of cerebral meninges   . Moderate intellectual disabilities   . Nausea alone   . Osteoarthrosis, unspecified whether generalized or localized, ankle and foot   . Pruritus ani   . Unspecified tinnitus   . Nonspecific elevation of levels of transaminase or lactic acid dehydrogenase (LDH)   . Leukorrhea, not specified as infective   . Obesity   . Hyperglycemia   . DDD (degenerative disc disease), lumbar   . Yeast infection of the skin     Frequent   Past Surgical History  Procedure Laterality Date  . Total abdominal hysterectomy    . Cataract extraction    . Elbow surgery      right elbow   History  Substance Use Topics  . Smoking status: Never Smoker   . Smokeless tobacco: Never Used  . Alcohol Use: No   Family History  Problem Relation Age of Onset  . Diabetes Mother   . Diabetes Maternal Aunt   . Heart disease Maternal Aunt      (had pacemaker)  . Diabetes Maternal Grandmother   . Stroke Mother   . Stroke Father   . Stroke Maternal Grandmother  Allergies  Allergen Reactions  . Ace Inhibitors     REACTION: COUGH  . Amoxicillin-Pot Clavulanate     REACTION: diarrhea  . Augmentin [Amoxicillin-Pot Clavulanate] Other (See Comments)  . Azithromycin Diarrhea  . Cyclobenzaprine Hcl     REACTION: diarrhea  . Flexeril [Cyclobenzaprine]   . Flonase [Fluticasone Propionate]     nosebleed  . Ibuprofen     REACTION: vomiting   Current Outpatient Prescriptions on File Prior to Visit  Medication Sig Dispense Refill  . aspirin 81 MG tablet Take 81 mg by mouth daily.        Marland Kitchen desonide (DESOWEN) 0.05 % ointment Apply 1 application topically 2 (two) times daily.      Marland Kitchen diltiazem (CARDIZEM CD) 240 MG 24 hr  capsule Take 1 capsule (240 mg total) by mouth daily.  90 capsule  1  . hydrochlorothiazide (MICROZIDE) 12.5 MG capsule Take 1 capsule (12.5 mg total) by mouth daily.  90 capsule  1  . ketoconazole (NIZORAL) 2 % shampoo Apply 1 application topically 2 (two) times a week.      . latanoprost (XALATAN) 0.005 % ophthalmic solution Place 1 drop into both eyes at bedtime.       . metFORMIN (GLUCOPHAGE) 500 MG tablet Take 500 mg by mouth 2 (two) times daily with a meal.      . omeprazole (PRILOSEC) 20 MG capsule Take 1 capsule (20 mg total) by mouth 2 (two) times daily.  180 capsule  1  . potassium chloride SA (K-DUR,KLOR-CON) 20 MEQ tablet Take 1 tablet (20 mEq total) by mouth daily.  90 tablet  3  . pravastatin (PRAVACHOL) 40 MG tablet Take 1 tablet (40 mg total) by mouth every evening.  90 tablet  3  . Propylene Glycol (SYSTANE BALANCE OP) Apply 2 drops to eye 2 (two) times daily as needed (dry eyes).       . sodium chloride (OCEAN) 0.65 % nasal spray Place 1 spray into the nose as needed for congestion.      . [DISCONTINUED] Calcium Carbonate-Vitamin D (CALCIUM-VITAMIN D) 500-200 MG-UNIT per tablet Take 1 tablet by mouth 2 (two) times daily with a meal.        . [DISCONTINUED] fexofenadine (ALLEGRA) 180 MG tablet Take 1 tablet (180 mg total) by mouth daily.  15 tablet  0   No current facility-administered medications on file prior to visit.     Review of Systems Review of Systems  Constitutional: Negative for fever, appetite change, fatigue and unexpected weight change.  ENT pos for rhinorrhea/ neg for ear or facial pain  Eyes: Negative for pain and visual disturbance.  Respiratory: Negative for wheeze  and shortness of breath.   Cardiovascular: Negative for cp or palpitations    Gastrointestinal: Negative for nausea, diarrhea and constipation.  Genitourinary: Negative for urgency and frequency.  Skin: Negative for pallor or rash   Neurological: Negative for weakness, light-headedness,  numbness and headaches.  Hematological: Negative for adenopathy. Does not bruise/bleed easily.  Psychiatric/Behavioral: Negative for dysphoric mood. The patient is not nervous/anxious.         Objective:   Physical Exam  Constitutional: She appears well-developed and well-nourished. No distress.  obese and well appearing   HENT:  Head: Normocephalic and atraumatic.  Right Ear: External ear normal.  Left Ear: External ear normal.  Mouth/Throat: Oropharynx is clear and moist. No oropharyngeal exudate.  Nares are injected and congested  Clear rhinorrhea  Eyes: Conjunctivae and EOM are normal. Pupils  are equal, round, and reactive to light. No scleral icterus.  Neck: Normal range of motion. Neck supple. No thyromegaly present.  Cardiovascular: Normal rate, regular rhythm, normal heart sounds and intact distal pulses.  Exam reveals no gallop.   Pulmonary/Chest: Effort normal and breath sounds normal. No respiratory distress. She has no wheezes. She has no rales.  Lymphadenopathy:    She has no cervical adenopathy.  Neurological: She is alert. She has normal reflexes. No cranial nerve deficit. She exhibits normal muscle tone. Coordination normal.  Skin: Skin is warm and dry. No rash noted. No erythema. No pallor.  Psychiatric: She has a normal mood and affect.  Nl affect with mod MR          Assessment & Plan:

## 2012-10-10 NOTE — Assessment & Plan Note (Signed)
With cong and rhinorrhea/ mild cough but no wheezing Rev ER notes  Disc tx  - will go back on claritin for the season Disc poss of an early viral uri also  Disc symptomatic care - see instructions on AVS  Update if not starting to improve in a week or if worsening

## 2012-10-14 ENCOUNTER — Ambulatory Visit (INDEPENDENT_AMBULATORY_CARE_PROVIDER_SITE_OTHER): Payer: Medicare Other | Admitting: Family Medicine

## 2012-10-14 ENCOUNTER — Telehealth: Payer: Self-pay | Admitting: Family Medicine

## 2012-10-14 ENCOUNTER — Encounter: Payer: Self-pay | Admitting: Family Medicine

## 2012-10-14 VITALS — BP 118/86 | HR 100 | Temp 97.8°F | Ht 62.0 in | Wt 204.8 lb

## 2012-10-14 DIAGNOSIS — N899 Noninflammatory disorder of vagina, unspecified: Secondary | ICD-10-CM | POA: Diagnosis not present

## 2012-10-14 DIAGNOSIS — N898 Other specified noninflammatory disorders of vagina: Secondary | ICD-10-CM

## 2012-10-14 DIAGNOSIS — R300 Dysuria: Secondary | ICD-10-CM | POA: Diagnosis not present

## 2012-10-14 LAB — POCT URINALYSIS DIPSTICK
Bilirubin, UA: NEGATIVE
Ketones, UA: NEGATIVE

## 2012-10-14 LAB — POCT WET PREP WITH KOH

## 2012-10-14 LAB — POCT UA - MICROSCOPIC ONLY
RBC, urine, microscopic: 0
WBC, Ur, HPF, POC: 0

## 2012-10-14 NOTE — Assessment & Plan Note (Signed)
Likely  Irritation due to detergent or soap. No UTI, no yeast, no BV.

## 2012-10-14 NOTE — Progress Notes (Signed)
  Subjective:    Patient ID: Linda Thompson, female    DOB: 07/21/1955, 57 y.o.   MRN: [id number]  HPI    57 year old female pt of Dr. Royden Purl presents with stretching feeling when she pees.  Mild burning with urination. Some itching in vaginal area. Has been going on 1 week. No vaginal discharge.  No abdominal pain, no fever. Minimal symptoms but she wanted to come to be checked just "to be better safe then sorry" She has been using baby wipes for BMs. Last night she had a little stinging when she wipes.   She has been using ivory soap, no douching, spray.  She has tried a new laundry detergent lately, liquid tide instead of powder.    Review of Systems  Constitutional: Negative for fever and fatigue.  HENT: Negative for ear pain.   Eyes: Negative for pain.  Respiratory: Negative for chest tightness and shortness of breath.   Cardiovascular: Negative for chest pain, palpitations and leg swelling.  Gastrointestinal: Negative for abdominal pain.  Genitourinary: Negative for dysuria.       Objective:   Physical Exam  Constitutional: Vital signs are normal. She appears well-developed and well-nourished. She is cooperative.  Non-toxic appearance. She does not appear ill. No distress.  overweight  HENT:  Head: Normocephalic.  Right Ear: Hearing, tympanic membrane and ear canal normal. Tympanic membrane is not erythematous, not retracted and not bulging.  Left Ear: Hearing, tympanic membrane and ear canal normal. Tympanic membrane is not erythematous, not retracted and not bulging.  Nose: No mucosal edema or rhinorrhea. Right sinus exhibits no maxillary sinus tenderness and no frontal sinus tenderness. Left sinus exhibits no maxillary sinus tenderness and no frontal sinus tenderness.  Mouth/Throat: Uvula is midline and mucous membranes are normal.  Eyes: Lids are normal. Lids are everted and swept, no foreign bodies found.  Neck: Trachea normal. Carotid bruit is not present. No mass  and no thyromegaly present.  Cardiovascular: Normal rate, regular rhythm, S1 normal, S2 normal, normal heart sounds, intact distal pulses and normal pulses.  Exam reveals no gallop and no friction rub.   No murmur heard. Pulmonary/Chest: Effort normal and breath sounds normal. Not tachypneic. No respiratory distress. She has no decreased breath sounds. She has no wheezes. She has no rhonchi. She has no rales.  Abdominal: Soft. Normal appearance and bowel sounds are normal. There is no tenderness.  Genitourinary: There is erythema around the vagina. No tenderness or bleeding around the vagina. No foreign body around the vagina. No signs of injury around the vagina. No vaginal discharge found.  Neurological: She is alert.  Skin: Skin is warm, dry and intact. No rash noted.  Psychiatric: Her speech is normal and behavior is normal. Judgment and thought content normal. Her mood appears not anxious. Cognition and memory are normal. She does not exhibit a depressed mood.          Assessment & Plan:

## 2012-10-14 NOTE — Patient Instructions (Signed)
Use hypoallergenic ( dye free) wipes and detergent.  Follow up with Dr. Milinda Antis if your symptoms are not improving.

## 2012-10-14 NOTE — Telephone Encounter (Signed)
[facility] Triage Call Report Triage Record Num: [id number] Operator: Lesli Albee Patient Name: Nastassja Witkop Call Date & Time: 10/13/2012 6:14:57PM Patient Phone: [phone] PCP: Audrie Gallus. Tower Patient Gender: Female PCP Fax : Patient DOB: 04-Aug-1955 Practice Name: [facility] Reason for Call: Caller: Yamila/Patient; PCP: Roxy Manns [facility]); CB#: [phone]; Call regarding Urinary Pain/Bleeding; Pt is calling to say that she has urinary pain. She called to schedule an appt in the am. Onset 3-4 days ago. No fever. Rn triaged per urinary symptoms and scheduled appt at 10:45 with Dr. Maryellen Pile. Protocol(s) Used: Urinary Symptoms - Female Recommended Outcome per Protocol: See Provider within 24 hours Reason for Outcome: Has one or more urinary tract symptoms AND has not been previously evaluated Care Advice: ~ SYMPTOM / CONDITION MANAGEMENT 10/13/2012 6:25:06PM Page 1 of 1 [email or web address]

## 2012-10-25 ENCOUNTER — Telehealth: Payer: Self-pay

## 2012-10-25 NOTE — Telephone Encounter (Signed)
Before I write that - please ask if they are seeking for placement in a group home or retirement community or another single house? Thanks

## 2012-10-25 NOTE — Telephone Encounter (Signed)
Linda Thompson Flippin pts health care POA said pt needs letter stating pt can live alone; Linda Thompson said pt's lawyers are in process of finding pt a new home; pt's home is being sold.Call Linda Thompson when letter is ready for pick up.

## 2012-10-26 ENCOUNTER — Telehealth: Payer: Self-pay

## 2012-10-26 NOTE — Telephone Encounter (Signed)
Left voicemail requesting pt to call office 

## 2012-10-26 NOTE — Telephone Encounter (Signed)
Pt has appt to see Dr Milinda Antis on 10/27/12 for cold symptoms; pt called this afternoon; pt ate cooked frozen dinner of chicken,broccoli,peas and rice about 4 pm today; in last 30 mins. Pt has had 2 loose BMs. Pt wants to know if OK to take one dose of Loperamide now and then see Dr Milinda Antis tomorrow. Advised pt yes she can take one dose of Loperamide now. Pt request me to ask Dr Milinda Antis also to confirm that it is OK for pt to take one dose of Loperamide now. Pt is not having any abd pain, fever, nausea or vomiting.

## 2012-10-26 NOTE — Telephone Encounter (Signed)
Yes that is fine

## 2012-10-27 ENCOUNTER — Encounter: Payer: Self-pay | Admitting: Family Medicine

## 2012-10-27 ENCOUNTER — Ambulatory Visit (INDEPENDENT_AMBULATORY_CARE_PROVIDER_SITE_OTHER): Payer: Medicare Other | Admitting: Family Medicine

## 2012-10-27 VITALS — BP 126/78 | HR 89 | Temp 98.2°F | Ht 62.0 in | Wt 203.0 lb

## 2012-10-27 DIAGNOSIS — J069 Acute upper respiratory infection, unspecified: Secondary | ICD-10-CM

## 2012-10-27 NOTE — Patient Instructions (Signed)
I think you have a mild cold Drink lots of fluids and rest  Do salt water gargles if needed when your throat is sore  Use claritin if needed for runny nose Update if not starting to improve in a week or if worsening

## 2012-10-27 NOTE — Telephone Encounter (Signed)
Pt had appt with Dr. Milinda Antis today

## 2012-10-27 NOTE — Progress Notes (Signed)
Subjective:    Patient ID: Linda Thompson, female    DOB: 12/16/1955, 57 y.o.   MRN: [id number]  HPI Here with uri symptoms Felt like tonsils got larger (perhaps glands under chin) occ nose runs / clear mucous  Throat is just a little scratchy and throat is dry  No fever  No cough - does clear her throat   May be change of weather also   Has claritin to take if she needs it   She had a loose stool yesterday and she took a dose or her loperimide (she takes it occ)\  Patient Active Problem List   Diagnosis Date Noted  . Vaginal irritation 10/14/2012  . Viral URI 08/30/2012  . Dermatitis 08/09/2012  . Diarrhea 06/23/2012  . Cystitis 06/13/2012  . Right foot pain 05/19/2012  . Right ankle pain 05/19/2012  . Seborrheic keratosis 04/23/2012  . Adverse effects of medication 12/17/2011  . Chronic dermatitis of hands 09/12/2011  . Tinea pedis 07/23/2011  . Obesity 05/05/2011  . Candidal intertrigo 11/15/2010  . Low back pain 09/13/2010  . Irritable bowel syndrome 03/05/2010  . HEART MURMUR 10/12/2009  . TRANSAMINASES, SERUM, ELEVATED 11/22/2007  . DIABETES MELLITUS, TYPE II 08/13/2007  . ALLERGIC  RHINITIS 09/23/2006  . FUNGAL DERMATITIS 08/26/2006  . DERMATITIS, SEBORRHEIC NOS 08/26/2006  . HYPERLIPIDEMIA 03/11/2006  . MENTAL RETARDATION, MODERATE 03/11/2006  . GLAUCOMA NOS 03/11/2006  . HYPERTENSION 03/11/2006  . GERD 03/11/2006  . HIATAL HERNIA 03/11/2006  . PRURITUS ANI 03/11/2006  . OSTEOARTHRITIS, FOOT 03/11/2006  . Personal history of other musculoskeletal disorders(V13.59) 03/11/2006  . MENINGIOMA 12/20/2001   Past Medical History  Diagnosis Date  . Allergic rhinitis, cause unspecified   . Pain in joint, ankle and foot   . Backache, unspecified     chronic LBP with radiculopathy  . Seborrheic dermatitis, unspecified   . Type II or unspecified type diabetes mellitus without mention of complication, not stated as uncontrolled   . History of fracture of arm      Right  . History of fracture of foot   . Dermatomycosis, unspecified   . Esophageal reflux   . Unspecified glaucoma(365.9)   . Undiagnosed cardiac murmurs   . Diaphragmatic hernia without mention of obstruction or gangrene   . Other and unspecified hyperlipidemia   . Unspecified essential hypertension   . Irritable bowel syndrome   . Benign neoplasm of cerebral meninges   . Moderate intellectual disabilities   . Nausea alone   . Osteoarthrosis, unspecified whether generalized or localized, ankle and foot   . Pruritus ani   . Unspecified tinnitus   . Nonspecific elevation of levels of transaminase or lactic acid dehydrogenase (LDH)   . Leukorrhea, not specified as infective   . Obesity   . Hyperglycemia   . DDD (degenerative disc disease), lumbar   . Yeast infection of the skin     Frequent   Past Surgical History  Procedure Laterality Date  . Total abdominal hysterectomy    . Cataract extraction    . Elbow surgery      right elbow   History  Substance Use Topics  . Smoking status: Never Smoker   . Smokeless tobacco: Never Used  . Alcohol Use: No   Family History  Problem Relation Age of Onset  . Diabetes Mother   . Diabetes Maternal Aunt   . Heart disease Maternal Aunt      (had pacemaker)  . Diabetes Maternal Grandmother   .  Stroke Mother   . Stroke Father   . Stroke Maternal Grandmother    Allergies  Allergen Reactions  . Ace Inhibitors     REACTION: COUGH  . Amoxicillin-Pot Clavulanate     REACTION: diarrhea  . Augmentin [Amoxicillin-Pot Clavulanate] Other (See Comments)  . Azithromycin Diarrhea  . Cyclobenzaprine Hcl     REACTION: diarrhea  . Flexeril [Cyclobenzaprine]   . Flonase [Fluticasone Propionate]     nosebleed  . Ibuprofen     REACTION: vomiting   Current Outpatient Prescriptions on File Prior to Visit  Medication Sig Dispense Refill  . aspirin 81 MG tablet Take 81 mg by mouth daily.        Marland Kitchen desonide (DESOWEN) 0.05 % ointment Apply  1 application topically 2 (two) times daily.      Marland Kitchen diltiazem (CARDIZEM CD) 240 MG 24 hr capsule Take 1 capsule (240 mg total) by mouth daily.  90 capsule  0  . hydrochlorothiazide (MICROZIDE) 12.5 MG capsule Take 1 capsule (12.5 mg total) by mouth daily.  90 capsule  1  . ketoconazole (NIZORAL) 2 % shampoo Apply 1 application topically 2 (two) times a week.      . latanoprost (XALATAN) 0.005 % ophthalmic solution Place 1 drop into both eyes at bedtime.       . metFORMIN (GLUCOPHAGE) 500 MG tablet Take 500 mg by mouth 2 (two) times daily with a meal.      . omeprazole (PRILOSEC) 20 MG capsule Take 1 capsule (20 mg total) by mouth 2 (two) times daily.  180 capsule  1  . potassium chloride SA (K-DUR,KLOR-CON) 20 MEQ tablet Take 1 tablet (20 mEq total) by mouth daily.  90 tablet  3  . pravastatin (PRAVACHOL) 40 MG tablet Take 1 tablet (40 mg total) by mouth every evening.  90 tablet  3  . Propylene Glycol (SYSTANE BALANCE OP) Apply 2 drops to eye 2 (two) times daily as needed (dry eyes).       . sodium chloride (OCEAN) 0.65 % nasal spray Place 1 spray into the nose as needed for congestion.      . [DISCONTINUED] Calcium Carbonate-Vitamin D (CALCIUM-VITAMIN D) 500-200 MG-UNIT per tablet Take 1 tablet by mouth 2 (two) times daily with a meal.        . [DISCONTINUED] fexofenadine (ALLEGRA) 180 MG tablet Take 1 tablet (180 mg total) by mouth daily.  15 tablet  0   No current facility-administered medications on file prior to visit.     Review of Systems  Constitutional: Negative for fever, appetite change, fatigue and unexpected weight change.  ENt pos for scratchy throat and runny nose neg for facial or ear pain  Eyes: Negative for pain and visual disturbance.  Respiratory: Negative for cough and shortness of breath.   Cardiovascular: Negative for cp or palpitations    Gastrointestinal: Negative for nausea, and constipation. pos for one episode of diarrhea  Genitourinary: Negative for urgency and  frequency.  Skin: Negative for pallor or rash   Neurological: Negative for weakness, light-headedness, numbness and headaches.  Hematological: Negative for adenopathy. Does not bruise/bleed easily.  Psychiatric/Behavioral: Negative for dysphoric mood. The patient is not nervous/anxious.      Review of Systems     Objective:   Physical Exam  Constitutional: She appears well-developed and well-nourished. No distress.  HENT:  Head: Normocephalic and atraumatic.  Right Ear: External ear normal.  Left Ear: External ear normal.  Mouth/Throat: Oropharynx is clear and moist. No  oropharyngeal exudate.  Boggy nares with clear rhinorrhea No sinus tenderness TMs clear Throat clear   Eyes: Conjunctivae and EOM are normal. Pupils are equal, round, and reactive to light. No scleral icterus.  Neck: Normal range of motion. Neck supple.  No adenopathy but pt is slt tender over ant cervical area   Cardiovascular: Normal rate and regular rhythm.   Pulmonary/Chest: Effort normal and breath sounds normal. No respiratory distress. She has no wheezes. She has no rales.  Musculoskeletal: She exhibits no edema.  Lymphadenopathy:    She has no cervical adenopathy.  Neurological: She is alert.  Skin: Skin is warm and dry. No rash noted.  Psychiatric: She has a normal mood and affect.          Assessment & Plan:

## 2012-10-27 NOTE — Assessment & Plan Note (Signed)
Very mild  Disc symptomatic care - see instructions on AVS  Update if not starting to improve in a week or if worsening   Handout given

## 2012-10-28 NOTE — Telephone Encounter (Signed)
Left 2nd voicemail requesting Fannie Knee to call office

## 2012-10-29 NOTE — Telephone Encounter (Signed)
Fannie Knee left me a voicemail saying that pt will be placed in an apartment setting but the apartment will have someone on call 24hr to assist the residence. Fannie Knee said this was the closest thing they could get to a group home but still giving pt her independence. Fannie Knee just needs a letter for the lawyer saying pt is still able to live alone and complete ADL with no problems, Fannie Knee request CB once letter is complete

## 2012-10-29 NOTE — Telephone Encounter (Signed)
Fannie Knee left voicemail requesting me to call her back so I called and left voicemail asking her what type of home is Ms. Yau moving to

## 2012-10-29 NOTE — Telephone Encounter (Signed)
That sounds great- thanks so much for her help - letter is in the IN box

## 2012-11-01 ENCOUNTER — Telehealth: Payer: Self-pay

## 2012-11-01 NOTE — Telephone Encounter (Signed)
That is ok - she had IBS and these occasional bouts of diarrhea are normal for her

## 2012-11-01 NOTE — Telephone Encounter (Signed)
Pt notified it's okay to take med

## 2012-11-01 NOTE — Telephone Encounter (Signed)
Left voicemail letting Fannie Knee know letter ready and asked her does she want to pick-up letter or have [place] mail it

## 2012-11-01 NOTE — Telephone Encounter (Signed)
Pt had loose BM (not watery BM) x 2 this AM; pt said was not very loose like last week; pt does not appear in any distress, no abd pain,no N&V,no fever. Pt has not taken Loperamide since seen 10/27/12; advised pt if not having very loose stool; pt can watch diet and cb if condition changes or worsens. Pt is not going to take Loperamide at this point. Pt has appt 11/03/12 with skin doctor and pt will take Loperamide that morning prior to leaving home to see doctor. Pt wants to confirm with Dr Milinda Antis all the above is appropriate.Please advise.

## 2012-11-02 ENCOUNTER — Telehealth: Payer: Self-pay

## 2012-11-02 ENCOUNTER — Emergency Department (HOSPITAL_COMMUNITY)
Admission: EM | Admit: 2012-11-02 | Discharge: 2012-11-02 | Disposition: A | Discharge status: Home / Self Care | Payer: Medicare Other | Attending: Emergency Medicine | Admitting: Emergency Medicine

## 2012-11-02 ENCOUNTER — Encounter (HOSPITAL_COMMUNITY): Payer: Self-pay | Admitting: Emergency Medicine

## 2012-11-02 ENCOUNTER — Emergency Department (HOSPITAL_COMMUNITY): Payer: Medicare Other

## 2012-11-02 DIAGNOSIS — M545 Low back pain, unspecified: Secondary | ICD-10-CM | POA: Insufficient documentation

## 2012-11-02 DIAGNOSIS — J3489 Other specified disorders of nose and nasal sinuses: Secondary | ICD-10-CM | POA: Diagnosis not present

## 2012-11-02 DIAGNOSIS — E785 Hyperlipidemia, unspecified: Secondary | ICD-10-CM | POA: Diagnosis not present

## 2012-11-02 DIAGNOSIS — Z881 Allergy status to other antibiotic agents status: Secondary | ICD-10-CM | POA: Diagnosis not present

## 2012-11-02 DIAGNOSIS — Z8719 Personal history of other diseases of the digestive system: Secondary | ICD-10-CM | POA: Diagnosis not present

## 2012-11-02 DIAGNOSIS — G8929 Other chronic pain: Secondary | ICD-10-CM | POA: Insufficient documentation

## 2012-11-02 DIAGNOSIS — Z8781 Personal history of (healed) traumatic fracture: Secondary | ICD-10-CM | POA: Insufficient documentation

## 2012-11-02 DIAGNOSIS — M51379 Other intervertebral disc degeneration, lumbosacral region without mention of lumbar back pain or lower extremity pain: Secondary | ICD-10-CM | POA: Insufficient documentation

## 2012-11-02 DIAGNOSIS — R0982 Postnasal drip: Secondary | ICD-10-CM | POA: Insufficient documentation

## 2012-11-02 DIAGNOSIS — K219 Gastro-esophageal reflux disease without esophagitis: Secondary | ICD-10-CM | POA: Insufficient documentation

## 2012-11-02 DIAGNOSIS — J019 Acute sinusitis, unspecified: Secondary | ICD-10-CM | POA: Insufficient documentation

## 2012-11-02 DIAGNOSIS — Z79899 Other long term (current) drug therapy: Secondary | ICD-10-CM | POA: Diagnosis not present

## 2012-11-02 DIAGNOSIS — F71 Moderate intellectual disabilities: Secondary | ICD-10-CM | POA: Diagnosis not present

## 2012-11-02 DIAGNOSIS — I10 Essential (primary) hypertension: Secondary | ICD-10-CM | POA: Diagnosis not present

## 2012-11-02 DIAGNOSIS — Z7982 Long term (current) use of aspirin: Secondary | ICD-10-CM | POA: Diagnosis not present

## 2012-11-02 DIAGNOSIS — M19079 Primary osteoarthritis, unspecified ankle and foot: Secondary | ICD-10-CM | POA: Diagnosis not present

## 2012-11-02 DIAGNOSIS — M5137 Other intervertebral disc degeneration, lumbosacral region: Secondary | ICD-10-CM | POA: Diagnosis not present

## 2012-11-02 DIAGNOSIS — J329 Chronic sinusitis, unspecified: Secondary | ICD-10-CM

## 2012-11-02 DIAGNOSIS — E669 Obesity, unspecified: Secondary | ICD-10-CM | POA: Diagnosis not present

## 2012-11-02 DIAGNOSIS — Z888 Allergy status to other drugs, medicaments and biological substances status: Secondary | ICD-10-CM | POA: Insufficient documentation

## 2012-11-02 DIAGNOSIS — J309 Allergic rhinitis, unspecified: Secondary | ICD-10-CM | POA: Insufficient documentation

## 2012-11-02 DIAGNOSIS — K589 Irritable bowel syndrome without diarrhea: Secondary | ICD-10-CM | POA: Insufficient documentation

## 2012-11-02 DIAGNOSIS — H409 Unspecified glaucoma: Secondary | ICD-10-CM | POA: Diagnosis not present

## 2012-11-02 DIAGNOSIS — E119 Type 2 diabetes mellitus without complications: Secondary | ICD-10-CM | POA: Insufficient documentation

## 2012-11-02 DIAGNOSIS — R011 Cardiac murmur, unspecified: Secondary | ICD-10-CM | POA: Diagnosis not present

## 2012-11-02 DIAGNOSIS — R0602 Shortness of breath: Secondary | ICD-10-CM | POA: Diagnosis not present

## 2012-11-02 NOTE — ED Provider Notes (Signed)
Medical screening examination/treatment/procedure(s) were performed by non-physician practitioner and as supervising physician I was immediately available for consultation/collaboration.   Brandt Loosen, MD 11/02/12 [phone]

## 2012-11-02 NOTE — ED Notes (Signed)
Pt states she used saline spray up her nose and then she started coughing. Pt denies pain. Pt denies vomiting.

## 2012-11-02 NOTE — ED Notes (Signed)
Pt states she is experiencing a productive cough; pt denies pain; reports hx bronchitis, and sinus issues; pt reports flare up of sinus in the last few weeks and received an inhaler; pt denies SOB or chest pain; Pt taken to x-ray at this time

## 2012-11-02 NOTE — Telephone Encounter (Signed)
I will see her then  

## 2012-11-02 NOTE — Telephone Encounter (Signed)
Pt still having occasional vaginal itching and vaginal and perineal irritation. Pt using ivory soap and same ALL unscented detergent. No pain or frequency when urinates.Pt uses baby wipes after BM. Pt scheduled appt with Dr Milinda Antis 11/03/12 at 8:30 am.

## 2012-11-02 NOTE — ED Provider Notes (Signed)
CSN: [id number]     Arrival date & time 11/02/12  0303 History   First MD Initiated Contact with Patient 11/02/12 0359     Chief Complaint  Patient presents with  . Cough   (Consider location/radiation/quality/duration/timing/severity/associated sxs/prior Treatment) HPI Comments: 57 year old female with an extensive past medical history presents for sinus congestion and cough x2 days. Patient states the symptoms have been intermittent since onset without any significant modifying factors. She has been using nasal saline spray with mild relief. Patient states that cough is dry and nonproductive. She denies associated fevers, rhinorrhea, ear pain, neck pain or stiffness, chest pain or shortness of breath, hemoptysis, nausea or vomiting, abdominal pain, numbness tingling in her extremities, and weakness.  The history is provided by the patient. No language interpreter was used.    Past Medical History  Diagnosis Date  . Allergic rhinitis, cause unspecified   . Pain in joint, ankle and foot   . Backache, unspecified     chronic LBP with radiculopathy  . Seborrheic dermatitis, unspecified   . Type II or unspecified type diabetes mellitus without mention of complication, not stated as uncontrolled   . History of fracture of arm     Right  . History of fracture of foot   . Dermatomycosis, unspecified   . Esophageal reflux   . Unspecified glaucoma(365.9)   . Undiagnosed cardiac murmurs   . Diaphragmatic hernia without mention of obstruction or gangrene   . Other and unspecified hyperlipidemia   . Unspecified essential hypertension   . Irritable bowel syndrome   . Benign neoplasm of cerebral meninges   . Moderate intellectual disabilities   . Nausea alone   . Osteoarthrosis, unspecified whether generalized or localized, ankle and foot   . Pruritus ani   . Unspecified tinnitus   . Nonspecific elevation of levels of transaminase or lactic acid dehydrogenase (LDH)   . Leukorrhea, not  specified as infective   . Obesity   . Hyperglycemia   . DDD (degenerative disc disease), lumbar   . Yeast infection of the skin     Frequent   Past Surgical History  Procedure Laterality Date  . Total abdominal hysterectomy    . Cataract extraction    . Elbow surgery      right elbow   Family History  Problem Relation Age of Onset  . Diabetes Mother   . Diabetes Maternal Aunt   . Heart disease Maternal Aunt      (had pacemaker)  . Diabetes Maternal Grandmother   . Stroke Mother   . Stroke Father   . Stroke Maternal Grandmother    History  Substance Use Topics  . Smoking status: Never Smoker   . Smokeless tobacco: Never Used  . Alcohol Use: No   OB History   Grav Para Term Preterm Abortions TAB SAB Ect Mult Living                 Review of Systems  Constitutional: Negative for fever.  HENT: Positive for congestion. Negative for ear pain, rhinorrhea, sinus pressure, sore throat, tinnitus and trouble swallowing.   Respiratory: Positive for cough. Negative for shortness of breath.   Cardiovascular: Negative for chest pain.  Gastrointestinal: Negative for nausea and vomiting.  Neurological: Negative for weakness and numbness.  All other systems reviewed and are negative.    Allergies  Ace inhibitors; Amoxicillin-pot clavulanate; Augmentin; Azithromycin; Cyclobenzaprine hcl; Flexeril; Flonase; and Ibuprofen  Home Medications   Current Outpatient Rx  Name  Route  Sig  Dispense  Refill  . aspirin 81 MG tablet   Oral   Take 81 mg by mouth daily.           Marland Kitchen desonide (DESOWEN) 0.05 % ointment   Topical   Apply 1 application topically 2 (two) times daily.         Marland Kitchen diltiazem (CARDIZEM CD) 240 MG 24 hr capsule   Oral   Take 1 capsule (240 mg total) by mouth daily.   90 capsule   0   . hydrochlorothiazide (MICROZIDE) 12.5 MG capsule   Oral   Take 1 capsule (12.5 mg total) by mouth daily.   90 capsule   1   . ketoconazole (NIZORAL) 2 % shampoo    Topical   Apply 1 application topically 2 (two) times a week.         . latanoprost (XALATAN) 0.005 % ophthalmic solution   Both Eyes   Place 1 drop into both eyes at bedtime.          . metFORMIN (GLUCOPHAGE) 500 MG tablet   Oral   Take 500 mg by mouth 2 (two) times daily with a meal.         . omeprazole (PRILOSEC) 20 MG capsule   Oral   Take 1 capsule (20 mg total) by mouth 2 (two) times daily.   180 capsule   1   . potassium chloride SA (K-DUR,KLOR-CON) 20 MEQ tablet   Oral   Take 1 tablet (20 mEq total) by mouth daily.   90 tablet   3   . pravastatin (PRAVACHOL) 40 MG tablet   Oral   Take 1 tablet (40 mg total) by mouth every evening.   90 tablet   3   . Propylene Glycol (SYSTANE BALANCE OP)   Ophthalmic   Apply 2 drops to eye 2 (two) times daily as needed (dry eyes).          . sodium chloride (OCEAN) 0.65 % nasal spray   Nasal   Place 1 spray into the nose as needed for congestion.          BP 147/77  Pulse 91  Temp(Src) 98 F (36.7 C) (Oral)  Resp 16  Ht 5\' 2"  (1.575 m)  Wt 201 lb 2 oz (91.23 kg)  BMI 36.78 kg/m2  SpO2 97%  Physical Exam  Nursing note and vitals reviewed. Constitutional: She is oriented to person, place, and time. She appears well-developed and well-nourished. No distress.  HENT:  Head: Normocephalic and atraumatic.  Right Ear: Tympanic membrane, external ear and ear canal normal.  Left Ear: Tympanic membrane, external ear and ear canal normal.  Nose: Nose normal. Right sinus exhibits no maxillary sinus tenderness and no frontal sinus tenderness. Left sinus exhibits no maxillary sinus tenderness and no frontal sinus tenderness.  Mouth/Throat: Uvula is midline, oropharynx is clear and moist and mucous membranes are normal. No oropharyngeal exudate.  Airway patent and patient tolerating secretions without difficulty. Uvula midline.  Eyes: Conjunctivae and EOM are normal. Pupils are equal, round, and reactive to light. No  scleral icterus.  Neck: Normal range of motion. Neck supple.  Cardiovascular: Normal rate, regular rhythm, normal heart sounds and intact distal pulses.   Pulmonary/Chest: Effort normal and breath sounds normal. No respiratory distress. She has no wheezes. She has no rales.  Abdominal: Soft. There is no tenderness. There is no rebound and no guarding.  Musculoskeletal: Normal range of motion.  Lymphadenopathy:  She has no cervical adenopathy.  Neurological: She is alert and oriented to person, place, and time.  Skin: Skin is warm and dry. No rash noted. She is not diaphoretic. No erythema. No pallor.  Psychiatric: She has a normal mood and affect. Her behavior is normal.    ED Course  Procedures (including critical care time) Labs Review Labs Reviewed - No data to display Imaging Review Dg Chest 2 View  11/02/2012   *RADIOLOGY REPORT*  Clinical Data: Cough and headache, shortness of breath.  CHEST - 2 VIEW  Comparison: Chest radiograph March 26, 2011  Findings: The cardiomediastinal silhouette is unremarkable.  The lungs are clear without pleural effusions or focal consolidations. The pulmonary vasculature is unremarkable.   Trachea projects midline and there is no pneumothorax.  The included soft tissue planes and osseous structures are unremarkable.  IMPRESSION: No acute cardiopulmonary process.   Original Report Authenticated By: Awilda Metro    EKG Interpretation   None       MDM   1. Sinus infection    57 year old female with an extensive past medical history presents for sinus symptoms x2 days with associated dry, nonproductive cough. Patient is well and nontoxic appearing, hemodynamically stable, and afebrile. She is without tachycardia, tachypnea, dyspnea, or hypoxia. Physical exam unremarkable. Heart regular rate and rhythm lungs clear to auscultation bilaterally. Chest x-ray without evidence of pneumonia or other acute cardiopulmonary process. Suspect that symptoms are  secondary to a sinus infection and that cough is the result of persistent postnasal drip. Patient appropriate for discharge with primary care followup as needed. I recommended over-the-counter sinus remedies and to continue use of saline nasal spray as needed. Return precautions discussed and patient are agreeable to plan with no unaddressed concerns.    Antony Madura, PA-C 11/02/12 [id number]

## 2012-11-03 ENCOUNTER — Encounter: Payer: Self-pay | Admitting: Family Medicine

## 2012-11-03 ENCOUNTER — Ambulatory Visit (INDEPENDENT_AMBULATORY_CARE_PROVIDER_SITE_OTHER): Payer: Medicare Other | Admitting: Family Medicine

## 2012-11-03 VITALS — BP 118/76 | HR 101 | Temp 97.9°F | Ht 62.0 in | Wt 202.2 lb

## 2012-11-03 DIAGNOSIS — L290 Pruritus ani: Secondary | ICD-10-CM

## 2012-11-03 DIAGNOSIS — N9489 Other specified conditions associated with female genital organs and menstrual cycle: Secondary | ICD-10-CM | POA: Diagnosis not present

## 2012-11-03 DIAGNOSIS — L0100 Impetigo, unspecified: Secondary | ICD-10-CM | POA: Diagnosis not present

## 2012-11-03 DIAGNOSIS — B369 Superficial mycosis, unspecified: Secondary | ICD-10-CM

## 2012-11-03 NOTE — Progress Notes (Signed)
Subjective:    Patient ID: Linda Thompson, female    DOB: [birth year], 57 y.o.   MRN: [id number]  HPI Here for perianal itching  This comes and goes - has had it for years  Still uses toilet paper and baby wipes   Has had several creams in the paset from me and also dermatologist  Using ketoconazole cream and also desonide ointment  She uses them "when she needs to "-- once per day (? Unsure)  Does scratching - tries not to  No vaginal itching or d/c or problems   Patient Active Problem List   Diagnosis Date Noted  . Vaginal irritation 10/14/2012  . Viral URI 08/30/2012  . Dermatitis 08/09/2012  . Diarrhea 06/23/2012  . Cystitis 06/13/2012  . Right foot pain 05/19/2012  . Right ankle pain 05/19/2012  . Seborrheic keratosis 04/23/2012  . Adverse effects of medication 12/17/2011  . Chronic dermatitis of hands 09/12/2011  . Tinea pedis 07/23/2011  . Obesity 05/05/2011  . Candidal intertrigo 11/15/2010  . Low back pain 09/13/2010  . Irritable bowel syndrome 03/05/2010  . HEART MURMUR 10/12/2009  . TRANSAMINASES, SERUM, ELEVATED 11/22/2007  . DIABETES MELLITUS, TYPE II 08/13/2007  . ALLERGIC  RHINITIS 09/23/2006  . FUNGAL DERMATITIS 08/26/2006  . DERMATITIS, SEBORRHEIC NOS 08/26/2006  . HYPERLIPIDEMIA 03/11/2006  . MENTAL RETARDATION, MODERATE 03/11/2006  . GLAUCOMA NOS 03/11/2006  . HYPERTENSION 03/11/2006  . GERD 03/11/2006  . HIATAL HERNIA 03/11/2006  . PRURITUS ANI 03/11/2006  . OSTEOARTHRITIS, FOOT 03/11/2006  . Personal history of other musculoskeletal disorders(V13.59) 03/11/2006  . MENINGIOMA 12/20/2001   Past Medical History  Diagnosis Date  . Allergic rhinitis, cause unspecified   . Pain in joint, ankle and foot   . Backache, unspecified     chronic LBP with radiculopathy  . Seborrheic dermatitis, unspecified   . Type II or unspecified type diabetes mellitus without mention of complication, not stated as uncontrolled   . History of fracture of arm    Right  . History of fracture of foot   . Dermatomycosis, unspecified   . Esophageal reflux   . Unspecified glaucoma(365.9)   . Undiagnosed cardiac murmurs   . Diaphragmatic hernia without mention of obstruction or gangrene   . Other and unspecified hyperlipidemia   . Unspecified essential hypertension   . Irritable bowel syndrome   . Benign neoplasm of cerebral meninges   . Moderate intellectual disabilities   . Nausea alone   . Osteoarthrosis, unspecified whether generalized or localized, ankle and foot   . Pruritus ani   . Unspecified tinnitus   . Nonspecific elevation of levels of transaminase or lactic acid dehydrogenase (LDH)   . Leukorrhea, not specified as infective   . Obesity   . Hyperglycemia   . DDD (degenerative disc disease), lumbar   . Yeast infection of the skin     Frequent   Past Surgical History  Procedure Laterality Date  . Total abdominal hysterectomy    . Cataract extraction    . Elbow surgery      right elbow   History  Substance Use Topics  . Smoking status: Never Smoker   . Smokeless tobacco: Never Used  . Alcohol Use: No   Family History  Problem Relation Age of Onset  . Diabetes Mother   . Diabetes Maternal Aunt   . Heart disease Maternal Aunt      (had pacemaker)  . Diabetes Maternal Grandmother   . Stroke Mother   . Stroke  Father   . Stroke Maternal Grandmother    Allergies  Allergen Reactions  . Ace Inhibitors     REACTION: COUGH  . Amoxicillin-Pot Clavulanate     REACTION: diarrhea  . Augmentin [Amoxicillin-Pot Clavulanate] Other (See Comments)  . Azithromycin Diarrhea  . Cyclobenzaprine Hcl     REACTION: diarrhea  . Flexeril [Cyclobenzaprine]   . Flonase [Fluticasone Propionate]     nosebleed  . Ibuprofen     REACTION: vomiting   Current Outpatient Prescriptions on File Prior to Visit  Medication Sig Dispense Refill  . aspirin 81 MG tablet Take 81 mg by mouth daily.        Marland Kitchen desonide (DESOWEN) 0.05 % ointment Apply 1  application topically 2 (two) times daily.      Marland Kitchen diltiazem (CARDIZEM CD) 240 MG 24 hr capsule Take 1 capsule (240 mg total) by mouth daily.  90 capsule  0  . hydrochlorothiazide (MICROZIDE) 12.5 MG capsule Take 1 capsule (12.5 mg total) by mouth daily.  90 capsule  1  . ketoconazole (NIZORAL) 2 % shampoo Apply 1 application topically 2 (two) times a week.      . latanoprost (XALATAN) 0.005 % ophthalmic solution Place 1 drop into both eyes at bedtime.       . metFORMIN (GLUCOPHAGE) 500 MG tablet Take 500 mg by mouth 2 (two) times daily with a meal.      . omeprazole (PRILOSEC) 20 MG capsule Take 1 capsule (20 mg total) by mouth 2 (two) times daily.  180 capsule  1  . potassium chloride SA (K-DUR,KLOR-CON) 20 MEQ tablet Take 1 tablet (20 mEq total) by mouth daily.  90 tablet  3  . pravastatin (PRAVACHOL) 40 MG tablet Take 1 tablet (40 mg total) by mouth every evening.  90 tablet  3  . Propylene Glycol (SYSTANE BALANCE OP) Apply 2 drops to eye 2 (two) times daily as needed (dry eyes).       . sodium chloride (OCEAN) 0.65 % nasal spray Place 1 spray into the nose as needed for congestion.      . [DISCONTINUED] Calcium Carbonate-Vitamin D (CALCIUM-VITAMIN D) 500-200 MG-UNIT per tablet Take 1 tablet by mouth 2 (two) times daily with a meal.        . [DISCONTINUED] fexofenadine (ALLEGRA) 180 MG tablet Take 1 tablet (180 mg total) by mouth daily.  15 tablet  0   No current facility-administered medications on file prior to visit.        Review of Systems Review of Systems  Constitutional: Negative for fever, appetite change, fatigue and unexpected weight change.  Eyes: Negative for pain and visual disturbance.  Respiratory: Negative for cough and shortness of breath.   Cardiovascular: Negative for cp or palpitations    Gastrointestinal: Negative for nausea, diarrhea and constipation.  Genitourinary: Negative for urgency and frequency.  Skin: Negative for pallor , ? Rash/ pos for perianal itching   Neurological: Negative for weakness, light-headedness, numbness and headaches.  Hematological: Negative for adenopathy. Does not bruise/bleed easily.  Psychiatric/Behavioral: Negative for dysphoric mood. The patient is not nervous/anxious.         Objective:   Physical Exam  Constitutional: She appears well-developed and well-nourished. No distress.  obese and well appearing   HENT:  Head: Normocephalic and atraumatic.  Eyes: Conjunctivae and EOM are normal. Pupils are equal, round, and reactive to light. No scleral icterus.  Neck: Normal range of motion. Neck supple.  Cardiovascular: Normal rate and regular rhythm.  Genitourinary:  Post R labia majora is full / soft to palp but not fluctuant and no lesions or evidence of trauma Non tender  No rash      Musculoskeletal: She exhibits no edema.  Lymphadenopathy:    She has no cervical adenopathy.  Skin: Skin is warm and dry. There is erythema.  Scant erythema 1 cm around anus - few excoriations/ no hemorrhoids or other findings   Some candidal intertrigo under pannus and at proximal thighs   Psychiatric: She has a normal mood and affect.  Baseline MR  Repeats herself frequently cheerful          Assessment & Plan:

## 2012-11-03 NOTE — Patient Instructions (Signed)
For anal area itching - keep very clean dry area extremely well after cleaning Use the ketoconazole cream as directed until symptoms improve Try not to scratch  You have a swollen area on right side of labia (of vagina) and this warrants further evaluation  We need you to see a gynecologist for this  We will do referral to gynecologist at check out

## 2012-11-04 NOTE — Assessment & Plan Note (Signed)
Ongoing-intermittent Suspect hygiene related -disc this in detail  Ketoconazole prn - this seems to help  Update if not starting to improve in a week or if worsening   Has seen derm for this and can f/u if needed

## 2012-11-04 NOTE — Assessment & Plan Note (Signed)
Intertrigo- prox thighs and under pannus Disc hygiene and drying  Ketoconazole Update

## 2012-11-04 NOTE — Assessment & Plan Note (Signed)
This is new  Ref to gyn for eval  Non tender without mucosal changes

## 2012-11-05 ENCOUNTER — Ambulatory Visit (HOSPITAL_COMMUNITY): Payer: Medicare Other | Attending: Emergency Medicine

## 2012-11-05 ENCOUNTER — Emergency Department (INDEPENDENT_AMBULATORY_CARE_PROVIDER_SITE_OTHER)
Admission: EM | Admit: 2012-11-05 | Discharge: 2012-11-05 | Disposition: A | Discharge status: Home / Self Care | Payer: Medicare Other | Source: Home / Self Care | Attending: Family Medicine | Admitting: Family Medicine

## 2012-11-05 ENCOUNTER — Encounter (HOSPITAL_COMMUNITY): Payer: Self-pay | Admitting: Emergency Medicine

## 2012-11-05 DIAGNOSIS — M7989 Other specified soft tissue disorders: Secondary | ICD-10-CM

## 2012-11-05 DIAGNOSIS — M799 Soft tissue disorder, unspecified: Secondary | ICD-10-CM

## 2012-11-05 DIAGNOSIS — R079 Chest pain, unspecified: Secondary | ICD-10-CM | POA: Diagnosis not present

## 2012-11-05 MED ORDER — TRAMADOL HCL 50 MG PO TABS: 50.0000 mg | ORAL_TABLET | Freq: Four times a day (QID) | ORAL | Status: DC | PRN

## 2012-11-05 NOTE — ED Notes (Signed)
Pt Triage and assessed by Provider.  Provider in before nurse.

## 2012-11-05 NOTE — ED Provider Notes (Signed)
CSN: [id number]     Arrival date & time 11/05/12  1616 History   First MD Initiated Contact with Patient 11/05/12 1816     Chief Complaint  Patient presents with  . Flank Pain   (Consider location/radiation/quality/duration/timing/severity/associated sxs/prior Treatment) HPI Comments: 57 year old female presents complaining of chest pain. She has pain in her left ribs that feels "like a toothache." She further describes this as a mild pain that comes and goes. It began earlier today. She denies any and all other symptoms associated with this pain. She denies radiation of the pain, nausea, diaphoresis, lightheadedness, history of MI. She does have a family history of multiple MIs at an early age resulting in that so she wanted to get checked out to make sure this is not the case. She has never had this before.   Past Medical History  Diagnosis Date  . Allergic rhinitis, cause unspecified   . Pain in joint, ankle and foot   . Backache, unspecified     chronic LBP with radiculopathy  . Seborrheic dermatitis, unspecified   . Type II or unspecified type diabetes mellitus without mention of complication, not stated as uncontrolled   . History of fracture of arm     Right  . History of fracture of foot   . Dermatomycosis, unspecified   . Esophageal reflux   . Unspecified glaucoma(365.9)   . Undiagnosed cardiac murmurs   . Diaphragmatic hernia without mention of obstruction or gangrene   . Other and unspecified hyperlipidemia   . Unspecified essential hypertension   . Irritable bowel syndrome   . Benign neoplasm of cerebral meninges   . Moderate intellectual disabilities   . Nausea alone   . Osteoarthrosis, unspecified whether generalized or localized, ankle and foot   . Pruritus ani   . Unspecified tinnitus   . Nonspecific elevation of levels of transaminase or lactic acid dehydrogenase (LDH)   . Leukorrhea, not specified as infective   . Obesity   . Hyperglycemia   . DDD  (degenerative disc disease), lumbar   . Yeast infection of the skin     Frequent   Past Surgical History  Procedure Laterality Date  . Total abdominal hysterectomy    . Cataract extraction    . Elbow surgery      right elbow   Family History  Problem Relation Age of Onset  . Diabetes Mother   . Diabetes Maternal Aunt   . Heart disease Maternal Aunt      (had pacemaker)  . Diabetes Maternal Grandmother   . Stroke Mother   . Stroke Father   . Stroke Maternal Grandmother    History  Substance Use Topics  . Smoking status: Never Smoker   . Smokeless tobacco: Never Used  . Alcohol Use: No   OB History   Grav Para Term Preterm Abortions TAB SAB Ect Mult Living                 Review of Systems  Constitutional: Negative for fever and chills.  Eyes: Negative for visual disturbance.  Respiratory: Negative for cough and shortness of breath.   Cardiovascular: Positive for chest pain (see HPI). Negative for palpitations and leg swelling.  Gastrointestinal: Negative for nausea, vomiting and abdominal pain.  Endocrine: Negative for polydipsia and polyuria.  Genitourinary: Negative for dysuria, urgency and frequency.  Musculoskeletal: Negative for arthralgias and myalgias.  Skin: Negative for rash.  Neurological: Negative for dizziness, weakness and light-headedness.    Allergies  Ace  inhibitors; Amoxicillin-pot clavulanate; Augmentin; Azithromycin; Cyclobenzaprine hcl; Flexeril; Flonase; and Ibuprofen  Home Medications   Current Outpatient Rx  Name  Route  Sig  Dispense  Refill  . aspirin 81 MG tablet   Oral   Take 81 mg by mouth daily.           Marland Kitchen diltiazem (CARDIZEM CD) 240 MG 24 hr capsule   Oral   Take 1 capsule (240 mg total) by mouth daily.   90 capsule   0   . hydrochlorothiazide (MICROZIDE) 12.5 MG capsule   Oral   Take 1 capsule (12.5 mg total) by mouth daily.   90 capsule   1   . ketoconazole (NIZORAL) 2 % shampoo   Topical   Apply 1 application  topically 2 (two) times a week.         . latanoprost (XALATAN) 0.005 % ophthalmic solution   Both Eyes   Place 1 drop into both eyes at bedtime.          . metFORMIN (GLUCOPHAGE) 500 MG tablet   Oral   Take 500 mg by mouth 2 (two) times daily with a meal.         . omeprazole (PRILOSEC) 20 MG capsule   Oral   Take 1 capsule (20 mg total) by mouth 2 (two) times daily.   180 capsule   1   . potassium chloride SA (K-DUR,KLOR-CON) 20 MEQ tablet   Oral   Take 1 tablet (20 mEq total) by mouth daily.   90 tablet   3   . pravastatin (PRAVACHOL) 40 MG tablet   Oral   Take 1 tablet (40 mg total) by mouth every evening.   90 tablet   3   . Propylene Glycol (SYSTANE BALANCE OP)   Ophthalmic   Apply 2 drops to eye 2 (two) times daily as needed (dry eyes).          . sodium chloride (OCEAN) 0.65 % nasal spray   Nasal   Place 1 spray into the nose as needed for congestion.         Marland Kitchen desonide (DESOWEN) 0.05 % ointment   Topical   Apply 1 application topically 2 (two) times daily.         . traMADol (ULTRAM) 50 MG tablet   Oral   Take 1 tablet (50 mg total) by mouth every 6 (six) hours as needed for pain.   20 tablet   0    BP 140/81  Pulse 110  Temp(Src) 98.1 F (36.7 C) (Oral)  Resp 18  SpO2 98% Physical Exam  Nursing note and vitals reviewed. Constitutional: She is oriented to person, place, and time. Vital signs are normal. She appears well-developed and well-nourished. No distress.  HENT:  Head: Normocephalic and atraumatic.  Neck: Normal range of motion. Neck supple.  Cardiovascular: Normal rate, regular rhythm and normal heart sounds.   Pulmonary/Chest: Effort normal and breath sounds normal. No respiratory distress.  Palpable mass in the soft tissues of the left mid axillary line, about the fifth rib  Abdominal: Soft. There is no tenderness.  Neurological: She is alert and oriented to person, place, and time. She has normal strength. Coordination  normal.  Skin: Skin is warm and dry. No rash noted. She is not diaphoretic.  Psychiatric: She has a normal mood and affect. Judgment normal.    ED Course  Procedures (including critical care time) Labs Review Labs Reviewed - No data to display Imaging  Review Dg Ribs Unilateral W/chest Left  11/05/2012   CLINICAL DATA:  Left chest pain  EXAM: LEFT RIBS AND CHEST - 3+ VIEW  COMPARISON:  11/02/2012  FINDINGS: Normal heart size. Minimal subsegmental atelectasis at the left base. No pneumothorax or pleural effusion. A nodular density projecting over the anterior left 3rd rib has been stable since 2011 and may simply represent a stable bony finding. No acute rib fracture.  IMPRESSION: No active cardiopulmonary disease. No evidence of acute rib fracture.   Electronically Signed   By: Maryclare Bean M.D.   On: 11/05/2012 19:55      MDM   1. Soft tissue mass    Chest x-ray reveals a stable nodular density in the area where this mass is felt. However, because this is causing pain, I will have her followup with Gen. surgery for consideration for biopsy and excision of this mass. Tramadol as needed for pain.   Meds ordered this encounter  Medications  . traMADol (ULTRAM) 50 MG tablet    Sig: Take 1 tablet (50 mg total) by mouth every 6 (six) hours as needed for pain.    Dispense:  20 tablet    Refill:  0    Order Specific Question:  Supervising Provider    Answer:  Clementeen Graham, Kathie Rhodes [[id number]]     Graylon Good, PA-C 11/06/12 2055

## 2012-11-08 ENCOUNTER — Encounter: Payer: Self-pay | Admitting: Obstetrics & Gynecology

## 2012-11-08 ENCOUNTER — Telehealth: Payer: Self-pay

## 2012-11-08 ENCOUNTER — Ambulatory Visit (INDEPENDENT_AMBULATORY_CARE_PROVIDER_SITE_OTHER): Payer: Medicare Other | Admitting: Obstetrics & Gynecology

## 2012-11-08 VITALS — BP 128/84 | HR 76 | Ht 64.0 in | Wt 202.4 lb

## 2012-11-08 DIAGNOSIS — N9489 Other specified conditions associated with female genital organs and menstrual cycle: Secondary | ICD-10-CM

## 2012-11-08 NOTE — Telephone Encounter (Signed)
That is fine 

## 2012-11-08 NOTE — Telephone Encounter (Signed)
Linda Thompson stop by office to pick-up letter

## 2012-11-08 NOTE — Telephone Encounter (Signed)
Pt was seen [facility] with chest pain;found lump under left arm. Pt is supposed to see specialist on 11/10/12 and pt wants to confirm OK to take Loperamide that morning so pt will not have an accident. Advised pt OK to take Loperamide x 1 before going to specialist.

## 2012-11-08 NOTE — Progress Notes (Signed)
GYNECOLOGY CLINIC ENCOUNTER NOTE  History:  57 y.o. PMP s/p TAH several years ago here today for evaluation of right labial swelling noted incidentally on exam by Dr. Roxy Manns. Patient denies any vulvar pain, discharge or other concerns.   The following portions of the patient's history were reviewed and updated as appropriate: allergies, current medications, past family history, past medical history, past social history, past surgical history and problem list.  Review of Systems:  Pertinent items are noted in HPI.  Objective:  Physical Exam BP 128/84  Pulse 76  Ht 5\' 4"  (1.626 m)  Wt 202 lb 6.4 oz (91.808 kg)  BMI 34.72 kg/m2 Gen: NAD Abd: Soft, nontender and nondistended Pelvic: Mid portion of R labium majus is bigger than the L side, soft, fluctuant mass palpated in that region. Labium is flesh colored, no signs of inflammation or irregular symptoms.  Assessment & Plan:  Benign labial swelling; likely a lipoma. No intervention needed unless patient becomes symptomatic.  Continue routine care by Dr. Milinda Antis.

## 2012-11-08 NOTE — ED Provider Notes (Signed)
Medical screening examination/treatment/procedure(s) were performed by a resident physician or non-physician practitioner and as the supervising physician I was immediately available for consultation/collaboration.  Evan Corey, MD    Evan S Corey, MD 11/08/12 0947 

## 2012-11-08 NOTE — Patient Instructions (Signed)
Return to clinic for any scheduled appointments or for any gynecologic concerns as needed.   

## 2012-11-09 ENCOUNTER — Telehealth: Payer: Self-pay | Admitting: *Deleted

## 2012-11-09 NOTE — Telephone Encounter (Signed)
Pt calling to make sure she can take Loperamide, per Dr. Milinda Antis ok to take the medication

## 2012-11-10 ENCOUNTER — Encounter (INDEPENDENT_AMBULATORY_CARE_PROVIDER_SITE_OTHER): Payer: Self-pay

## 2012-11-10 ENCOUNTER — Ambulatory Visit (INDEPENDENT_AMBULATORY_CARE_PROVIDER_SITE_OTHER): Payer: Medicare Other | Admitting: General Surgery

## 2012-11-10 ENCOUNTER — Encounter (INDEPENDENT_AMBULATORY_CARE_PROVIDER_SITE_OTHER): Payer: Self-pay | Admitting: General Surgery

## 2012-11-10 VITALS — BP 128/80 | HR 86 | Resp 18 | Ht 62.0 in | Wt 200.2 lb

## 2012-11-10 DIAGNOSIS — D1739 Benign lipomatous neoplasm of skin and subcutaneous tissue of other sites: Secondary | ICD-10-CM

## 2012-11-10 DIAGNOSIS — D1720 Benign lipomatous neoplasm of skin and subcutaneous tissue of unspecified limb: Secondary | ICD-10-CM | POA: Insufficient documentation

## 2012-11-10 NOTE — Progress Notes (Addendum)
Patient ID: Linda Thompson, female   DOB: 1955-12-06, 57 y.o.   MRN: [id number]  Chief Complaint  Patient presents with  . New Evaluation    eval lt nodular chest mass    HPI Linda Thompson is a 57 y.o. female.   HPI 57 year old Caucasian female referred by Dr. Denyse Amass for evaluation of a left axillary subcutaneous mass. The patient states that she developed some pain in her left lateral chest wall. She described it as a throbbing sensation. She also characterizes it as a toothache. It occurred this past Friday. Because of family history of heart problems she was concerned and went to the urgent care facility. She states that they checked her heart which was within normal limits. She states that when the physician pressed on her lateral chest wall it caused discomfort and he noticed a soft tissue mass. She denies any trauma to the area. She states that the area has never been red or drained anything. She denies any other soft tissue lumps or bumps. She denies any weight loss. She denies any fevers, chills or night sweats. She has a power of attorney. Past Medical History  Diagnosis Date  . Allergic rhinitis, cause unspecified   . Pain in joint, ankle and foot   . Backache, unspecified     chronic LBP with radiculopathy  . Seborrheic dermatitis, unspecified   . Type II or unspecified type diabetes mellitus without mention of complication, not stated as uncontrolled   . History of fracture of arm     Right  . History of fracture of foot   . Dermatomycosis, unspecified   . Esophageal reflux   . Unspecified glaucoma(365.9)   . Undiagnosed cardiac murmurs   . Diaphragmatic hernia without mention of obstruction or gangrene   . Other and unspecified hyperlipidemia   . Unspecified essential hypertension   . Irritable bowel syndrome   . Benign neoplasm of cerebral meninges   . Moderate intellectual disabilities   . Nausea alone   . Osteoarthrosis, unspecified whether generalized or localized,  ankle and foot   . Pruritus ani   . Unspecified tinnitus   . Nonspecific elevation of levels of transaminase or lactic acid dehydrogenase (LDH)   . Leukorrhea, not specified as infective   . Obesity   . Hyperglycemia   . DDD (degenerative disc disease), lumbar   . Yeast infection of the skin     Frequent    Past Surgical History  Procedure Laterality Date  . Total abdominal hysterectomy    . Cataract extraction    . Elbow surgery      right elbow    Family History  Problem Relation Age of Onset  . Diabetes Mother   . Diabetes Maternal Aunt   . Heart disease Maternal Aunt      (had pacemaker)  . Diabetes Maternal Grandmother   . Stroke Mother   . Stroke Father   . Stroke Maternal Grandmother     Social History History  Substance Use Topics  . Smoking status: Never Smoker   . Smokeless tobacco: Never Used  . Alcohol Use: No    Allergies  Allergen Reactions  . Ace Inhibitors     REACTION: COUGH  . Amoxicillin-Pot Clavulanate     REACTION: diarrhea  . Augmentin [Amoxicillin-Pot Clavulanate] Other (See Comments)  . Azithromycin Diarrhea  . Cyclobenzaprine Hcl     REACTION: diarrhea  . Flexeril [Cyclobenzaprine]   . Flonase [Fluticasone Propionate]     nosebleed  .  Ibuprofen     REACTION: vomiting    Current Outpatient Prescriptions  Medication Sig Dispense Refill  . aspirin 81 MG tablet Take 81 mg by mouth daily.        Marland Kitchen desonide (DESOWEN) 0.05 % ointment Apply 1 application topically 2 (two) times daily.      Marland Kitchen diltiazem (CARDIZEM CD) 240 MG 24 hr capsule Take 1 capsule (240 mg total) by mouth daily.  90 capsule  0  . econazole nitrate 1 % cream       . hydrochlorothiazide (MICROZIDE) 12.5 MG capsule Take 1 capsule (12.5 mg total) by mouth daily.  90 capsule  1  . ketoconazole (NIZORAL) 2 % shampoo Apply 1 application topically 2 (two) times a week.      . latanoprost (XALATAN) 0.005 % ophthalmic solution Place 1 drop into both eyes at bedtime.       .  metFORMIN (GLUCOPHAGE) 500 MG tablet Take 500 mg by mouth 2 (two) times daily with a meal.      . omeprazole (PRILOSEC) 20 MG capsule Take 1 capsule (20 mg total) by mouth 2 (two) times daily.  180 capsule  1  . potassium chloride SA (K-DUR,KLOR-CON) 20 MEQ tablet Take 1 tablet (20 mEq total) by mouth daily.  90 tablet  3  . pravastatin (PRAVACHOL) 40 MG tablet Take 1 tablet (40 mg total) by mouth every evening.  90 tablet  3  . Propylene Glycol (SYSTANE BALANCE OP) Apply 2 drops to eye 2 (two) times daily as needed (dry eyes).       . sodium chloride (OCEAN) 0.65 % nasal spray Place 1 spray into the nose as needed for congestion.      . traMADol (ULTRAM) 50 MG tablet Take 1 tablet (50 mg total) by mouth every 6 (six) hours as needed for pain.  20 tablet  0  . [DISCONTINUED] Calcium Carbonate-Vitamin D (CALCIUM-VITAMIN D) 500-200 MG-UNIT per tablet Take 1 tablet by mouth 2 (two) times daily with a meal.        . [DISCONTINUED] fexofenadine (ALLEGRA) 180 MG tablet Take 1 tablet (180 mg total) by mouth daily.  15 tablet  0   No current facility-administered medications for this visit.    Review of Systems Review of Systems  Constitutional: Negative for fever, activity change, appetite change and unexpected weight change.  HENT: Negative for nosebleeds and trouble swallowing.   Eyes: Negative for photophobia and visual disturbance.  Respiratory: Negative for chest tightness and shortness of breath.   Cardiovascular: Negative for chest pain and leg swelling.       Denies CP, SOB, orthopnea, PND, DOE  Gastrointestinal: Negative for abdominal pain, diarrhea and constipation.  Genitourinary: Negative for dysuria and difficulty urinating.  Musculoskeletal: Negative for arthralgias.  Skin: Negative for pallor and rash.  Neurological: Negative for dizziness, seizures, facial asymmetry and numbness.       Denies TIA and amaurosis fugax   Hematological: Negative for adenopathy. Does not bruise/bleed  easily.  Psychiatric/Behavioral: Negative for behavioral problems and agitation.    Blood pressure 128/80, pulse 86, resp. rate 18, height 5\' 2"  (1.575 m), weight 200 lb 3.2 oz (90.81 kg).  Physical Exam Physical Exam  Vitals reviewed. Constitutional: She is oriented to person, place, and time. She appears well-developed and well-nourished. No distress.  Overweight; chaperone Linda Thompson  HENT:  Head: Normocephalic and atraumatic.  Right Ear: External ear normal.  Left Ear: External ear normal.  Eyes: Conjunctivae are normal. No  scleral icterus.  Neck: Normal range of motion. Neck supple. No tracheal deviation present. No thyromegaly present.  Cardiovascular: Normal rate and normal heart sounds.   Pulmonary/Chest: Effort normal and breath sounds normal. No stridor. No respiratory distress. She has no wheezes. Left breast exhibits no inverted nipple, no mass, no nipple discharge, no skin change and no tenderness.    Left breast exam WNL; left mid-axillary line - well circumscribed soft tissue mass, 3 x2cm. Soft, mobile. No overlying skin lesion.   Musculoskeletal: She exhibits no edema and no tenderness.  Lymphadenopathy:    She has no cervical adenopathy.    She has no axillary adenopathy.       Right: No supraclavicular adenopathy present.       Left: No supraclavicular adenopathy present.  Neurological: She is alert and oriented to person, place, and time. She exhibits normal muscle tone.  Skin: Skin is warm and dry. No rash noted. She is not diaphoretic. No erythema.  Psychiatric: Her behavior is normal. Thought content normal.    Data Reviewed Dr Macon Large note 10/20 Dr Denyse Amass note 10/17 CXR 10/17  Assessment    Left mid axillary subcutaneous mass     Plan    This left mid axillary Subcutaneous mass is most consistent with a lipoma on exam. The nodular density seen on her chest x-ray overlying the third rib does not correlate with the soft tissue mass on physical  exam.  We discussed the etiology and management of lipomas. The patient was given educational material. We discussed that the majority of lipomas are benign although on a rare occasion it can be malignant.   We discussed observation versus surgical excision. We discussed the risks and benefits of surgery including but not limited to bleeding, infection, injury to surrounding structures, scarring, cosmetic concerns, possible temporary drain placement, blood clot formation, anesthesia issues, possible recurrence, and the typical postoperative course.   The patient has elected To proceed with surgery Since she does not want to get any larger. I explained that I would contact her power of attorney and discussed her diagnosis with her. At the power of attorney and the patient agree with surgical intervention then our office will contact her power of attorney to schedule surgery in the future. Her power of attorney is Linda Thompson (h) [phone]; (o) [phone]   ADDENDUM: spoke to POA Linda Thompson about dx and treatment option along risk/benefits of surgery. Advised her no rush to have area removed. States she has a lot going on. Advised her to discuss timing of surgery with pt and to contact our office of how and when they would like to proceed.   Mary Sella. Andrey Campanile, MD, FACS General, Bariatric, & Minimally Invasive Surgery [facility], [place]          [facility] M 11/10/2012, 9:35 AM

## 2012-11-10 NOTE — Patient Instructions (Signed)
I will talk to Linda Thompson. If you and [name] agree with surgery then my office will contact her to schedule your surgery  Lipoma A lipoma is a noncancerous (benign) tumor composed of fat cells. They are usually found under the skin (subcutaneous). A lipoma may occur in any tissue of the body that contains fat. Common areas for lipomas to appear include the back, shoulders, buttocks, and thighs. Lipomas are a very common soft tissue growth. They are soft and grow slowly. Most problems caused by a lipoma depend on where it is growing. DIAGNOSIS  A lipoma can be diagnosed with a physical exam. These tumors rarely become cancerous, but radiographic studies can help determine this for certain. Studies used may include:  Computerized X-ray scans (CT or CAT scan).  Computerized magnetic scans (MRI). TREATMENT  Small lipomas that are not causing problems may be watched. If a lipoma continues to enlarge or causes problems, removal is often the best treatment. Lipomas can also be removed to improve appearance. Surgery is done to remove the fatty cells and the surrounding capsule. Most often, this is done with medicine that numbs the area (local anesthetic). The removed tissue is examined under a microscope to make sure it is not cancerous. Keep all follow-up appointments with your caregiver. SEEK MEDICAL CARE IF:   The lipoma becomes larger or hard.  The lipoma becomes painful, red, or increasingly swollen. These could be signs of infection or a more serious condition. Document Released: 12/27/2001 Document Revised: 03/31/2011 Document Reviewed: 06/08/2009 [facility] Patient Information 2014 [place], [place].

## 2012-11-15 ENCOUNTER — Telehealth: Payer: Self-pay

## 2012-11-15 NOTE — Telephone Encounter (Signed)
Linda Thompson is fine- she can ALWAYS take it before and appt if needed

## 2012-11-15 NOTE — Telephone Encounter (Signed)
Pt wants to know if OK to take Loperamide prior to going to dental appt on 11/16/12. Advised pt Dr Milinda Antis has previously said OK to take prior to going to appts or going on outings with church group; pt not to take on a daily or routine basis. Pt said she does not routinely take Loperamide. Advised pt OK to take prior to dental appt. Pt does not need cb unless Dr Milinda Antis needs to advise pt.

## 2012-11-17 ENCOUNTER — Emergency Department (HOSPITAL_COMMUNITY)
Admission: EM | Admit: 2012-11-17 | Discharge: 2012-11-17 | Disposition: A | Discharge status: Home / Self Care | Payer: Medicare Other | Attending: Emergency Medicine | Admitting: Emergency Medicine

## 2012-11-17 ENCOUNTER — Encounter (HOSPITAL_COMMUNITY): Payer: Self-pay | Admitting: Emergency Medicine

## 2012-11-17 DIAGNOSIS — Z7982 Long term (current) use of aspirin: Secondary | ICD-10-CM | POA: Diagnosis not present

## 2012-11-17 DIAGNOSIS — Z79899 Other long term (current) drug therapy: Secondary | ICD-10-CM | POA: Insufficient documentation

## 2012-11-17 DIAGNOSIS — F411 Generalized anxiety disorder: Secondary | ICD-10-CM | POA: Insufficient documentation

## 2012-11-17 DIAGNOSIS — E119 Type 2 diabetes mellitus without complications: Secondary | ICD-10-CM | POA: Diagnosis not present

## 2012-11-17 DIAGNOSIS — Z8742 Personal history of other diseases of the female genital tract: Secondary | ICD-10-CM | POA: Insufficient documentation

## 2012-11-17 DIAGNOSIS — N390 Urinary tract infection, site not specified: Secondary | ICD-10-CM | POA: Diagnosis not present

## 2012-11-17 DIAGNOSIS — Z8781 Personal history of (healed) traumatic fracture: Secondary | ICD-10-CM | POA: Diagnosis not present

## 2012-11-17 DIAGNOSIS — Z8619 Personal history of other infectious and parasitic diseases: Secondary | ICD-10-CM | POA: Diagnosis not present

## 2012-11-17 DIAGNOSIS — R109 Unspecified abdominal pain: Secondary | ICD-10-CM | POA: Diagnosis not present

## 2012-11-17 DIAGNOSIS — I10 Essential (primary) hypertension: Secondary | ICD-10-CM | POA: Diagnosis not present

## 2012-11-17 DIAGNOSIS — E785 Hyperlipidemia, unspecified: Secondary | ICD-10-CM | POA: Insufficient documentation

## 2012-11-17 DIAGNOSIS — K219 Gastro-esophageal reflux disease without esophagitis: Secondary | ICD-10-CM | POA: Diagnosis not present

## 2012-11-17 DIAGNOSIS — Z872 Personal history of diseases of the skin and subcutaneous tissue: Secondary | ICD-10-CM | POA: Insufficient documentation

## 2012-11-17 DIAGNOSIS — E669 Obesity, unspecified: Secondary | ICD-10-CM | POA: Insufficient documentation

## 2012-11-17 DIAGNOSIS — M19079 Primary osteoarthritis, unspecified ankle and foot: Secondary | ICD-10-CM | POA: Insufficient documentation

## 2012-11-17 DIAGNOSIS — R011 Cardiac murmur, unspecified: Secondary | ICD-10-CM | POA: Diagnosis not present

## 2012-11-17 DIAGNOSIS — H409 Unspecified glaucoma: Secondary | ICD-10-CM | POA: Insufficient documentation

## 2012-11-17 LAB — URINALYSIS, ROUTINE W REFLEX MICROSCOPIC
Bilirubin Urine: NEGATIVE
Glucose, UA: NEGATIVE mg/dL
Hgb urine dipstick: NEGATIVE
Ketones, ur: NEGATIVE mg/dL
Nitrite: NEGATIVE
Protein, ur: NEGATIVE mg/dL
Specific Gravity, Urine: 1.028 (ref 1.005–1.030)
Urobilinogen, UA: 0.2 mg/dL (ref 0.0–1.0)
pH: 5 (ref 5.0–8.0)

## 2012-11-17 LAB — URINE MICROSCOPIC-ADD ON

## 2012-11-17 MED ORDER — CEPHALEXIN 500 MG PO CAPS: 500.0000 mg | ORAL_CAPSULE | Freq: Four times a day (QID) | ORAL | Status: DC

## 2012-11-17 NOTE — ED Provider Notes (Signed)
CSN: [id number]     Arrival date & time 11/17/12  2129 History  This chart was scribed for non-physician practitioner Dierdre Forth, PA-C working with Audree Camel, MD by Danella Maiers, ED Scribe. This patient was seen in room TR07C/TR07C and the patient's care was started at 10:37 PM.   Chief Complaint  Patient presents with  . Back Pain   The history is provided by the patient. No language interpreter was used.   HPI Comments: Linda Thompson is a 57 y.o. female with a h/o degenerative disc disease who presents to the Emergency Department complaining of constant left flank pain that started two days ago. She states she thinks she has a UTI because of similar symptoms in the past. She denies dysuria, nausea, vomiting, foul-smelling urine, fever, chills, hematuria. She reports cloudy urine. She denies injuries or falls.  She has not tried any medications for pain relief.  Nothing makes the symptoms better or worse.   Past Medical History  Diagnosis Date  . Allergic rhinitis, cause unspecified   . Pain in joint, ankle and foot   . Backache, unspecified     chronic LBP with radiculopathy  . Seborrheic dermatitis, unspecified   . Type II or unspecified type diabetes mellitus without mention of complication, not stated as uncontrolled   . History of fracture of arm     Right  . History of fracture of foot   . Dermatomycosis, unspecified   . Esophageal reflux   . Unspecified glaucoma(365.9)   . Undiagnosed cardiac murmurs   . Diaphragmatic hernia without mention of obstruction or gangrene   . Other and unspecified hyperlipidemia   . Unspecified essential hypertension   . Irritable bowel syndrome   . Benign neoplasm of cerebral meninges   . Moderate intellectual disabilities   . Nausea alone   . Osteoarthrosis, unspecified whether generalized or localized, ankle and foot   . Pruritus ani   . Unspecified tinnitus   . Nonspecific elevation of levels of transaminase or lactic  acid dehydrogenase (LDH)   . Leukorrhea, not specified as infective   . Obesity   . Hyperglycemia   . DDD (degenerative disc disease), lumbar   . Yeast infection of the skin     Frequent   Past Surgical History  Procedure Laterality Date  . Total abdominal hysterectomy    . Cataract extraction    . Elbow surgery      right elbow   Family History  Problem Relation Age of Onset  . Diabetes Mother   . Diabetes Maternal Aunt   . Heart disease Maternal Aunt      (had pacemaker)  . Diabetes Maternal Grandmother   . Stroke Mother   . Stroke Father   . Stroke Maternal Grandmother    History  Substance Use Topics  . Smoking status: Never Smoker   . Smokeless tobacco: Never Used  . Alcohol Use: No   OB History   Grav Para Term Preterm Abortions TAB SAB Ect Mult Living                 Review of Systems  Constitutional: Negative for fever, diaphoresis, appetite change, fatigue and unexpected weight change.  HENT: Negative for mouth sores.   Eyes: Negative for visual disturbance.  Respiratory: Negative for cough, chest tightness, shortness of breath and wheezing.   Cardiovascular: Negative for chest pain.  Gastrointestinal: Negative for nausea, vomiting, abdominal pain, diarrhea and constipation.  Endocrine: Negative for polydipsia, polyphagia and  polyuria.  Genitourinary: Negative for dysuria, urgency, frequency and hematuria.  Musculoskeletal: Positive for back pain. Negative for neck stiffness.  Skin: Negative for rash.  Allergic/Immunologic: Negative for immunocompromised state.  Neurological: Negative for syncope, light-headedness and headaches.  Hematological: Does not bruise/bleed easily.  Psychiatric/Behavioral: Negative for sleep disturbance. The patient is not nervous/anxious.     Allergies  Ace inhibitors; Amoxicillin-pot clavulanate; Augmentin; Azithromycin; Cyclobenzaprine hcl; Flexeril; Flonase; and Ibuprofen  Home Medications   Current Outpatient Rx  Name   Route  Sig  Dispense  Refill  . aspirin 81 MG tablet   Oral   Take 81 mg by mouth daily.           Marland Kitchen desonide (DESOWEN) 0.05 % ointment   Topical   Apply 1 application topically 2 (two) times daily.         Marland Kitchen diltiazem (CARDIZEM CD) 240 MG 24 hr capsule   Oral   Take 1 capsule (240 mg total) by mouth daily.   90 capsule   0   . econazole nitrate 1 % cream               . hydrochlorothiazide (MICROZIDE) 12.5 MG capsule   Oral   Take 1 capsule (12.5 mg total) by mouth daily.   90 capsule   1   . ketoconazole (NIZORAL) 2 % shampoo   Topical   Apply 1 application topically 2 (two) times a week.         . latanoprost (XALATAN) 0.005 % ophthalmic solution   Both Eyes   Place 1 drop into both eyes at bedtime.          . metFORMIN (GLUCOPHAGE) 500 MG tablet   Oral   Take 500 mg by mouth 2 (two) times daily with a meal.         . omeprazole (PRILOSEC) 20 MG capsule   Oral   Take 1 capsule (20 mg total) by mouth 2 (two) times daily.   180 capsule   1   . potassium chloride SA (K-DUR,KLOR-CON) 20 MEQ tablet   Oral   Take 1 tablet (20 mEq total) by mouth daily.   90 tablet   3   . pravastatin (PRAVACHOL) 40 MG tablet   Oral   Take 1 tablet (40 mg total) by mouth every evening.   90 tablet   3   . Propylene Glycol (SYSTANE BALANCE OP)   Ophthalmic   Apply 2 drops to eye 2 (two) times daily as needed (dry eyes).          . cephALEXin (KEFLEX) 500 MG capsule   Oral   Take 1 capsule (500 mg total) by mouth 4 (four) times daily.   40 capsule   0    BP 145/76  Pulse 109  Temp(Src) 98.1 F (36.7 C) (Oral)  Resp 18  SpO2 96% Physical Exam  Nursing note and vitals reviewed. Constitutional: She is oriented to person, place, and time. She appears well-developed and well-nourished. No distress.  HENT:  Head: Normocephalic and atraumatic.  Mouth/Throat: Oropharynx is clear and moist. No oropharyngeal exudate.  Eyes: Conjunctivae are normal.  Neck:  Normal range of motion. Neck supple.  Full ROM without pain  Cardiovascular: Regular rhythm, normal heart sounds and intact distal pulses.   No murmur heard. Mild Tachycardia  Pulmonary/Chest: Effort normal and breath sounds normal. No respiratory distress. She has no wheezes.  Abdominal: Soft. She exhibits no distension. There is no tenderness.  Abdomen  soft and nontender  Musculoskeletal: Normal range of motion. She exhibits no tenderness.  Full range of motion of the T-spine and L-spine No flank tenderness. No cva tenderness, no midline or paraspinal tenderness.  Lymphadenopathy:    She has no cervical adenopathy.  Neurological: She is alert and oriented to person, place, and time. She has normal reflexes. She exhibits normal muscle tone. Coordination normal.  Speech is clear and goal oriented, follows commands Normal strength in upper and lower extremities bilaterally including dorsiflexion and plantar flexion, strong and equal grip strength Sensation normal to light and sharp touch Moves extremities without ataxia, coordination intact Normal gait Normal balance   Skin: Skin is warm and dry. No rash noted. She is not diaphoretic. No erythema.  Psychiatric: Her mood appears anxious.  Pt is very anxious     ED Course  Procedures (including critical care time) Medications - No data to display  DIAGNOSTIC STUDIES: Oxygen Saturation is 96% on RA, normal by my interpretation.    COORDINATION OF CARE: 11:06 PM- Discussed treatment plan with pt which includes antibiotics. Pt agrees to plan.    Labs Review Labs Reviewed  URINALYSIS, ROUTINE W REFLEX MICROSCOPIC - Abnormal; Notable for the following:    APPearance CLOUDY (*)    Leukocytes, UA MODERATE (*)    All other components within normal limits  URINE MICROSCOPIC-ADD ON - Abnormal; Notable for the following:    Casts HYALINE CASTS (*)    All other components within normal limits  URINE CULTURE   Imaging Review No  results found.  EKG Interpretation   None       MDM   1. UTI (lower urinary tract infection)   2. Flank pain     TIAN DAVISON presents with Left low back pain/flank pain.  Pt has been diagnosed with a UTI via UA. Pt is afebrile, no CVA tenderness, normotensive, and denies N/V. Pt to be dc home with antibiotics and instructions to follow up with PCP if symptoms persist.  It has been determined that no acute conditions requiring further emergency intervention are present at this time. The patient/guardian have been advised of the diagnosis and plan. We have discussed signs and symptoms that warrant return to the ED, such as changes or worsening in symptoms.   Vital signs are stable at discharge.  Pt initially noted to be tachycardic on triage with persistent mild tachycardia on exam, but pt is very anxious during Hx and PE.  BP 129/81  Pulse 103  Temp(Src) 97.3 F (36.3 C) (Oral)  Resp 16  SpO2 97%  Patient/guardian has voiced understanding and agreed to follow-up with the PCP or specialist.    I personally performed the services described in this documentation, which was scribed in my presence. The recorded information has been reviewed and is accurate.    Dahlia Client Golda Zavalza, PA-C 11/18/12 0007

## 2012-11-17 NOTE — ED Notes (Signed)
Pt. reports left lower back pain for 2 days , denies  injury or fall , pt. thinks she has UTI .

## 2012-11-17 NOTE — ED Notes (Signed)
Patient says she has had an MRI and they told her she has arthritis in her back.  Today, she feels like her pain is lower in her flank area.  She says her urine is dark but denies malodorous or cloudy.

## 2012-11-18 ENCOUNTER — Telehealth: Payer: Self-pay

## 2012-11-18 LAB — URINE CULTURE: Colony Count: NO GROWTH

## 2012-11-18 NOTE — Telephone Encounter (Signed)
Patient advised.

## 2012-11-18 NOTE — Telephone Encounter (Signed)
Pt said was seen [facility] 11/17/12 and was given Cephalexin 500 mg to take 1 cap four times a day for 10 days. In the past antibiotics in general have caused pt to have diarrhea. Pt wants advice if should take Cephalexin or not and if need to change med send to [facility]. Pt has Loperamide at home in case of diarrhea. Pt said she has not taken med yet and will wait for advice. Please advise. Dr Milinda Antis may not be on desktop and is out of office this week. Please advise.

## 2012-11-18 NOTE — Telephone Encounter (Signed)
Please call pt.  I would take the antibiotic; it should help.  Thanks.

## 2012-11-19 NOTE — ED Provider Notes (Signed)
Medical screening examination/treatment/procedure(s) were performed by non-physician practitioner and as supervising physician I was immediately available for consultation/collaboration.  EKG Interpretation   None         Audree Camel, MD 11/19/12 1733

## 2012-11-22 ENCOUNTER — Encounter: Payer: Self-pay | Admitting: Family Medicine

## 2012-11-22 ENCOUNTER — Ambulatory Visit (INDEPENDENT_AMBULATORY_CARE_PROVIDER_SITE_OTHER): Payer: Medicare Other | Admitting: Family Medicine

## 2012-11-22 VITALS — BP 136/84 | HR 96 | Temp 98.2°F | Ht 62.0 in | Wt 202.5 lb

## 2012-11-22 DIAGNOSIS — M545 Low back pain, unspecified: Secondary | ICD-10-CM

## 2012-11-22 HISTORY — DX: Low back pain, unspecified: M54.50

## 2012-11-22 NOTE — Assessment & Plan Note (Signed)
Ongoing- acute on chronic but improved from ER visit  Hx of deg spine dz Reviewed ER notes and labs  Urine culture came back neg-inst to stop her keflex If pain worsens again - she will get a f/u appt with Dr Ophelia Charter

## 2012-11-22 NOTE — Patient Instructions (Signed)
Stop the antibiotic (keflex) - because your urine culture came back from the ER and it was negative for infection  I think your back pain is due to your arthritis now - and I'm glad it is getting better  If pain increases again- I advise that you get an appointment with Dr Ophelia Charter  Please keep me updated

## 2012-11-22 NOTE — Progress Notes (Signed)
Subjective:    Patient ID: Linda Thompson, female    DOB: 1955/08/22, 57 y.o.   MRN: [id number]  HPI Here for f/u of uti Seen in ER 10/30= and given keflex 500 mg QID based on cloudy urine with leukocytes The antibiotic caused diarrhea- she called about that   Now cx has returned negative  She is anxious to stop the antibiotic   Her original symptoms were constant L flank pain and cloudy urine  She actually points to her low back as opposed to her flank today  Aware she has some deg disc/ OA in back   Sees Dr Ophelia Charter for this   No urinary symptoms at all   Patient Active Problem List   Diagnosis Date Noted  . Lipoma of axilla 11/10/2012  . Labial swelling 11/03/2012  . Vaginal irritation 10/14/2012  . Viral URI 08/30/2012  . Diarrhea 06/23/2012  . Seborrheic keratosis 04/23/2012  . Chronic dermatitis of hands 09/12/2011  . Obesity 05/05/2011  . Candidal intertrigo 11/15/2010  . Irritable bowel syndrome 03/05/2010  . HEART MURMUR 10/12/2009  . TRANSAMINASES, SERUM, ELEVATED 11/22/2007  . DIABETES MELLITUS, TYPE II 08/13/2007  . ALLERGIC  RHINITIS 09/23/2006  . FUNGAL DERMATITIS 08/26/2006  . DERMATITIS, SEBORRHEIC NOS 08/26/2006  . HYPERLIPIDEMIA 03/11/2006  . MENTAL RETARDATION, MODERATE 03/11/2006  . GLAUCOMA NOS 03/11/2006  . HYPERTENSION 03/11/2006  . GERD 03/11/2006  . HIATAL HERNIA 03/11/2006  . PRURITUS ANI 03/11/2006  . OSTEOARTHRITIS, FOOT 03/11/2006  . Personal history of other musculoskeletal disorders(V13.59) 03/11/2006  . MENINGIOMA 12/20/2001   Past Medical History  Diagnosis Date  . Allergic rhinitis, cause unspecified   . Pain in joint, ankle and foot   . Backache, unspecified     chronic LBP with radiculopathy  . Seborrheic dermatitis, unspecified   . Type II or unspecified type diabetes mellitus without mention of complication, not stated as uncontrolled   . History of fracture of arm     Right  . History of fracture of foot   .  Dermatomycosis, unspecified   . Esophageal reflux   . Unspecified glaucoma(365.9)   . Undiagnosed cardiac murmurs   . Diaphragmatic hernia without mention of obstruction or gangrene   . Other and unspecified hyperlipidemia   . Unspecified essential hypertension   . Irritable bowel syndrome   . Benign neoplasm of cerebral meninges   . Moderate intellectual disabilities   . Nausea alone   . Osteoarthrosis, unspecified whether generalized or localized, ankle and foot   . Pruritus ani   . Unspecified tinnitus   . Nonspecific elevation of levels of transaminase or lactic acid dehydrogenase (LDH)   . Leukorrhea, not specified as infective   . Obesity   . Hyperglycemia   . DDD (degenerative disc disease), lumbar   . Yeast infection of the skin     Frequent   Past Surgical History  Procedure Laterality Date  . Total abdominal hysterectomy    . Cataract extraction    . Elbow surgery      right elbow   History  Substance Use Topics  . Smoking status: Never Smoker   . Smokeless tobacco: Never Used  . Alcohol Use: No   Family History  Problem Relation Age of Onset  . Diabetes Mother   . Diabetes Maternal Aunt   . Heart disease Maternal Aunt      (had pacemaker)  . Diabetes Maternal Grandmother   . Stroke Mother   . Stroke Father   .  Stroke Maternal Grandmother    Allergies  Allergen Reactions  . Ace Inhibitors     REACTION: COUGH  . Amoxicillin-Pot Clavulanate     REACTION: diarrhea  . Augmentin [Amoxicillin-Pot Clavulanate] Other (See Comments)  . Azithromycin Diarrhea  . Cyclobenzaprine Hcl     REACTION: diarrhea  . Flexeril [Cyclobenzaprine]   . Flonase [Fluticasone Propionate]     nosebleed  . Ibuprofen     REACTION: vomiting   Current Outpatient Prescriptions on File Prior to Visit  Medication Sig Dispense Refill  . aspirin 81 MG tablet Take 81 mg by mouth daily.        . cephALEXin (KEFLEX) 500 MG capsule Take 1 capsule (500 mg total) by mouth 4 (four) times  daily.  40 capsule  0  . desonide (DESOWEN) 0.05 % ointment Apply 1 application topically 2 (two) times daily.      Marland Kitchen diltiazem (CARDIZEM CD) 240 MG 24 hr capsule Take 1 capsule (240 mg total) by mouth daily.  90 capsule  0  . econazole nitrate 1 % cream       . hydrochlorothiazide (MICROZIDE) 12.5 MG capsule Take 1 capsule (12.5 mg total) by mouth daily.  90 capsule  1  . ketoconazole (NIZORAL) 2 % shampoo Apply 1 application topically 2 (two) times a week.      . latanoprost (XALATAN) 0.005 % ophthalmic solution Place 1 drop into both eyes at bedtime.       . metFORMIN (GLUCOPHAGE) 500 MG tablet Take 500 mg by mouth 2 (two) times daily with a meal.      . omeprazole (PRILOSEC) 20 MG capsule Take 1 capsule (20 mg total) by mouth 2 (two) times daily.  180 capsule  1  . potassium chloride SA (K-DUR,KLOR-CON) 20 MEQ tablet Take 1 tablet (20 mEq total) by mouth daily.  90 tablet  3  . pravastatin (PRAVACHOL) 40 MG tablet Take 1 tablet (40 mg total) by mouth every evening.  90 tablet  3  . Propylene Glycol (SYSTANE BALANCE OP) Apply 2 drops to eye 2 (two) times daily as needed (dry eyes).       . [DISCONTINUED] Calcium Carbonate-Vitamin D (CALCIUM-VITAMIN D) 500-200 MG-UNIT per tablet Take 1 tablet by mouth 2 (two) times daily with a meal.        . [DISCONTINUED] fexofenadine (ALLEGRA) 180 MG tablet Take 1 tablet (180 mg total) by mouth daily.  15 tablet  0   No current facility-administered medications on file prior to visit.      Review of Systems     Objective:   Physical Exam  Constitutional: She appears well-developed and well-nourished. No distress.  obese and well appearing   HENT:  Head: Normocephalic and atraumatic.  Eyes: Conjunctivae and EOM are normal. Pupils are equal, round, and reactive to light. No scleral icterus.  Neck: Normal range of motion. Neck supple.  Cardiovascular: Regular rhythm.   Rate in low 90s at rest  Pulmonary/Chest: Effort normal and breath sounds normal.  She has no rales.  Abdominal: Soft. Bowel sounds are normal. She exhibits no distension and no mass. There is no tenderness.  No cva tenderness No suprapubic tenderness or fullness    Musculoskeletal: She exhibits tenderness. She exhibits no edema.  Some tenderness over LS (L4 -L5)  Also in L paraspinal musculature Nl rom - but pain upon raising from table Neg SLR Nl gait   Lymphadenopathy:    She has no cervical adenopathy.  Neurological: She is  alert. She has normal reflexes.  Skin: Skin is warm and dry. No rash noted.  Psychiatric: She has a normal mood and affect.  Baseline MR- repeats herself frequently and tangential speech           Assessment & Plan:

## 2012-11-23 ENCOUNTER — Telehealth: Payer: Self-pay

## 2012-11-23 ENCOUNTER — Other Ambulatory Visit: Payer: Self-pay | Admitting: Family Medicine

## 2012-11-23 MED ORDER — KETOCONAZOLE 2 % EX SHAM: 1.0000 "application " | MEDICATED_SHAMPOO | CUTANEOUS | Status: DC

## 2012-11-23 NOTE — Telephone Encounter (Signed)
Pt left v/m requesting cb; no other message. Spoke with pt; pt said she stopped the antibiotic as instructed. Pt had normal BM this morning. Pt is supposed to go out on 11/25/12 in the afternoon on business and pt will take loperamide before she goes out on Thursday. Pt wants to know if OK with Dr Milinda Antis.

## 2012-11-23 NOTE — Telephone Encounter (Signed)
Linda Thompson is always ok before going out  thanks

## 2012-11-23 NOTE — Telephone Encounter (Signed)
Pt notified okay to take med before going out

## 2012-11-25 ENCOUNTER — Telehealth: Payer: Self-pay

## 2012-11-25 ENCOUNTER — Telehealth (INDEPENDENT_AMBULATORY_CARE_PROVIDER_SITE_OTHER): Payer: Self-pay | Admitting: General Surgery

## 2012-11-25 NOTE — Telephone Encounter (Signed)
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 Pt wants Dr Milinda Antis be aware that pts neighbors are upset over some of the sales Linda Thompson has done. Linda Thompson wants Dr Milinda Antis to know that this is going on so if her BP goes up Dr Milinda Antis will understand why. Pt does not have any CP, SOB, H/A or dizziness. Linda Thompson said that she is not upset and her BP is not up; Linda Thompson just wants Dr Milinda Antis to know what is going on. No call back needed. Advised pt if she has any problems or symptoms pt will cb.

## 2012-11-25 NOTE — Telephone Encounter (Signed)
I know she does not have a guardian at this time - and she takes care of her own housekeeping and finances etc--- would she like [place] to talk to her neighbor [name] about the situation?

## 2012-11-25 NOTE — Telephone Encounter (Signed)
Would like to schedule surgery for 1/2 week of December

## 2012-11-26 NOTE — Telephone Encounter (Signed)
Posting sheet and orders given to [facility] 11.7

## 2012-11-26 NOTE — Telephone Encounter (Signed)
Orders given to scheduling department.

## 2012-11-27 DIAGNOSIS — Z0279 Encounter for issue of other medical certificate: Secondary | ICD-10-CM

## 2012-12-01 ENCOUNTER — Telehealth: Payer: Self-pay | Admitting: Family Medicine

## 2012-12-01 DIAGNOSIS — M5126 Other intervertebral disc displacement, lumbar region: Secondary | ICD-10-CM | POA: Diagnosis not present

## 2012-12-01 DIAGNOSIS — M545 Low back pain: Secondary | ICD-10-CM | POA: Diagnosis not present

## 2012-12-01 NOTE — Telephone Encounter (Signed)
Done and in IN box 

## 2012-12-01 NOTE — Telephone Encounter (Signed)
Forms in your inbox

## 2012-12-01 NOTE — Telephone Encounter (Signed)
Pt dropped off forms from the [facility] that needs to be completed.  She says to make you aware that she needs these forms prior to December 24, 2012.  Also, once completed she needs 3 copies, one for herself, one her attny, and one for the DMV.  Please call her once completed and she will come pick them up.  I have placed these on your desk for Dr. Milinda Antis.  Thank you.

## 2012-12-02 NOTE — Telephone Encounter (Signed)
Pt okay for now

## 2012-12-02 NOTE — Telephone Encounter (Signed)
Pt picked up forms.

## 2012-12-06 ENCOUNTER — Other Ambulatory Visit: Payer: Self-pay | Admitting: Family Medicine

## 2012-12-06 ENCOUNTER — Telehealth (INDEPENDENT_AMBULATORY_CARE_PROVIDER_SITE_OTHER): Payer: Self-pay | Admitting: General Surgery

## 2012-12-06 ENCOUNTER — Other Ambulatory Visit: Payer: Self-pay | Admitting: *Deleted

## 2012-12-06 MED ORDER — PRAVASTATIN SODIUM 40 MG PO TABS: 40.0000 mg | ORAL_TABLET | Freq: Every evening | ORAL | Status: DC

## 2012-12-06 MED ORDER — POTASSIUM CHLORIDE CRYS ER 20 MEQ PO TBCR: 20.0000 meq | EXTENDED_RELEASE_TABLET | Freq: Every day | ORAL | Status: DC

## 2012-12-06 NOTE — Telephone Encounter (Signed)
Patient called to verify she could have normal activities after surgery. I advised that she would be sore but after 24 hours she could resume normal activities other than lifting over 30 lbs with her left arm.

## 2012-12-07 ENCOUNTER — Encounter: Payer: Self-pay | Admitting: Family Medicine

## 2012-12-07 ENCOUNTER — Ambulatory Visit (INDEPENDENT_AMBULATORY_CARE_PROVIDER_SITE_OTHER): Payer: Medicare Other | Admitting: Family Medicine

## 2012-12-07 VITALS — BP 124/72 | HR 92 | Temp 97.6°F | Ht 62.0 in | Wt 201.2 lb

## 2012-12-07 DIAGNOSIS — I10 Essential (primary) hypertension: Secondary | ICD-10-CM

## 2012-12-07 DIAGNOSIS — F430 Acute stress reaction: Secondary | ICD-10-CM

## 2012-12-07 DIAGNOSIS — L290 Pruritus ani: Secondary | ICD-10-CM

## 2012-12-07 NOTE — Patient Instructions (Signed)
For itching in the anal /rectal area-keep using the ketoconazole cream daily as needed and keep the area as clean and dry as possible  If no improvement please let me know and we will get you back to the dermatologist  If your anxiety gets worse please let me know

## 2012-12-07 NOTE — Progress Notes (Signed)
Subjective:    Patient ID: Linda Thompson, female    DOB: 08-17-1955, 57 y.o.   MRN: [id number]  HPI Here for ongoing itching in anal area  She has had years of pruritis ani -with multiple treatments including steroid (desonide) ketoconazole cream and nizoral shampoo and (past) atapro gel samples from dermatologist for itch Cannot afford the atapro- too expensive   Has "been itching down there" -more lately  She has been using the ketoconazole cream when she needs it  She has a little atapro left also  She thinks that her "nerves" are making it worse    hygeine may not be optimal- she has MR    She is looking to move into an apt after the first of the year She is on a waiting list for a place on [address]  She got upset with neighbors -who were upset with her selling some of her things  She was worried about her bp getting too high   Patient Active Problem List   Diagnosis Date Noted  . Low back pain 11/22/2012  . Lipoma of axilla 11/10/2012  . Labial swelling 11/03/2012  . Vaginal irritation 10/14/2012  . Diarrhea 06/23/2012  . Seborrheic keratosis 04/23/2012  . Chronic dermatitis of hands 09/12/2011  . Obesity 05/05/2011  . Candidal intertrigo 11/15/2010  . Irritable bowel syndrome 03/05/2010  . HEART MURMUR 10/12/2009  . TRANSAMINASES, SERUM, ELEVATED 11/22/2007  . DIABETES MELLITUS, TYPE II 08/13/2007  . ALLERGIC  RHINITIS 09/23/2006  . FUNGAL DERMATITIS 08/26/2006  . DERMATITIS, SEBORRHEIC NOS 08/26/2006  . HYPERLIPIDEMIA 03/11/2006  . MENTAL RETARDATION, MODERATE 03/11/2006  . GLAUCOMA NOS 03/11/2006  . HYPERTENSION 03/11/2006  . GERD 03/11/2006  . HIATAL HERNIA 03/11/2006  . PRURITUS ANI 03/11/2006  . OSTEOARTHRITIS, FOOT 03/11/2006  . Personal history of other musculoskeletal disorders(V13.59) 03/11/2006  . MENINGIOMA 12/20/2001   Past Medical History  Diagnosis Date  . Allergic rhinitis, cause unspecified   . Pain in joint, ankle and foot   . Backache,  unspecified     chronic LBP with radiculopathy  . Seborrheic dermatitis, unspecified   . Type II or unspecified type diabetes mellitus without mention of complication, not stated as uncontrolled   . History of fracture of arm     Right  . History of fracture of foot   . Dermatomycosis, unspecified   . Esophageal reflux   . Unspecified glaucoma(365.9)   . Undiagnosed cardiac murmurs   . Diaphragmatic hernia without mention of obstruction or gangrene   . Other and unspecified hyperlipidemia   . Unspecified essential hypertension   . Irritable bowel syndrome   . Benign neoplasm of cerebral meninges   . Moderate intellectual disabilities   . Nausea alone   . Osteoarthrosis, unspecified whether generalized or localized, ankle and foot   . Pruritus ani   . Unspecified tinnitus   . Nonspecific elevation of levels of transaminase or lactic acid dehydrogenase (LDH)   . Leukorrhea, not specified as infective   . Obesity   . Hyperglycemia   . DDD (degenerative disc disease), lumbar   . Yeast infection of the skin     Frequent   Past Surgical History  Procedure Laterality Date  . Total abdominal hysterectomy    . Cataract extraction    . Elbow surgery      right elbow   History  Substance Use Topics  . Smoking status: Never Smoker   . Smokeless tobacco: Never Used  . Alcohol Use: No  Family History  Problem Relation Age of Onset  . Diabetes Mother   . Diabetes Maternal Aunt   . Heart disease Maternal Aunt      (had pacemaker)  . Diabetes Maternal Grandmother   . Stroke Mother   . Stroke Father   . Stroke Maternal Grandmother    Allergies  Allergen Reactions  . Ace Inhibitors     REACTION: COUGH  . Amoxicillin-Pot Clavulanate     REACTION: diarrhea  . Augmentin [Amoxicillin-Pot Clavulanate] Other (See Comments)  . Azithromycin Diarrhea  . Cyclobenzaprine Hcl     REACTION: diarrhea  . Flexeril [Cyclobenzaprine]   . Flonase [Fluticasone Propionate]     nosebleed  .  Ibuprofen     REACTION: vomiting   Current Outpatient Prescriptions on File Prior to Visit  Medication Sig Dispense Refill  . aspirin 81 MG tablet Take 81 mg by mouth daily.        Marland Kitchen desonide (DESOWEN) 0.05 % ointment Apply 1 application topically 2 (two) times daily.      Marland Kitchen diltiazem (CARDIZEM CD) 240 MG 24 hr capsule TAKE 1 CAPSULE (240 MG TOTAL) BY MOUTH DAILY.  90 capsule  1  . econazole nitrate 1 % cream       . hydrochlorothiazide (MICROZIDE) 12.5 MG capsule Take 1 capsule (12.5 mg total) by mouth daily.  90 capsule  1  . ketoconazole (NIZORAL) 2 % shampoo Apply 1 application topically 2 (two) times a week.  120 mL  3  . latanoprost (XALATAN) 0.005 % ophthalmic solution Place 1 drop into both eyes at bedtime.       . metFORMIN (GLUCOPHAGE) 500 MG tablet Take 500 mg by mouth 2 (two) times daily with a meal.      . omeprazole (PRILOSEC) 20 MG capsule Take 1 capsule (20 mg total) by mouth 2 (two) times daily.  180 capsule  1  . potassium chloride SA (K-DUR,KLOR-CON) 20 MEQ tablet Take 1 tablet (20 mEq total) by mouth daily.  90 tablet  3  . pravastatin (PRAVACHOL) 40 MG tablet Take 1 tablet (40 mg total) by mouth every evening.  90 tablet  3  . Propylene Glycol (SYSTANE BALANCE OP) Apply 2 drops to eye 2 (two) times daily as needed (dry eyes).       . [DISCONTINUED] Calcium Carbonate-Vitamin D (CALCIUM-VITAMIN D) 500-200 MG-UNIT per tablet Take 1 tablet by mouth 2 (two) times daily with a meal.        . [DISCONTINUED] fexofenadine (ALLEGRA) 180 MG tablet Take 1 tablet (180 mg total) by mouth daily.  15 tablet  0   No current facility-administered medications on file prior to visit.      Review of Systems Review of Systems  Constitutional: Negative for fever, appetite change, fatigue and unexpected weight change.  Eyes: Negative for pain and visual disturbance.  Respiratory: Negative for cough and shortness of breath.   Cardiovascular: Negative for cp or palpitations     Gastrointestinal: Negative for nausea, diarrhea and constipation.  Genitourinary: Negative for urgency and frequency.  Skin: Negative for pallor or rash  pos for frequent anal itching  Neurological: Negative for weakness, light-headedness, numbness and headaches.  Hematological: Negative for adenopathy. Does not bruise/bleed easily.  Psychiatric/Behavioral: Negative for dysphoric mood. The patient is anxious over moving and other stressors today.         Objective:   Physical Exam  Constitutional: She appears well-developed and well-nourished. No distress.  HENT:  Head: Normocephalic and atraumatic.  Mouth/Throat: Oropharynx is clear and moist.  Neck: Normal range of motion. Neck supple.  Cardiovascular: Normal rate and regular rhythm.   Abdominal: Soft. Bowel sounds are normal. She exhibits no distension and no mass. There is no tenderness.  Genitourinary: No vaginal discharge found.  Mild erythema without skin breakdown or satellite lesions around rectum  No excoriations   No vaginal discharge   Musculoskeletal: She exhibits no edema.  Lymphadenopathy:    She has no cervical adenopathy.  Psychiatric:  Baseline MR_repeats herself often  Somewhat agitated when talking about her upcoming move and problems with the neighbors - almost tearful  I was able to calm her down and reassure her           Assessment & Plan:

## 2012-12-08 NOTE — Assessment & Plan Note (Signed)
Disc her difficult social situation that is a bit hard to understand-involving moving /neighbors and need to sell items She will be moving into an apt - hope in the winter- which will provide a safer env with her in light of MR I enc her to keep working with her lawyer

## 2012-12-08 NOTE — Assessment & Plan Note (Signed)
Pt has recurrent problem- may be hygiene related / with MR Went over protocol for cleansing and drying thoroughly Disc use of ketoconazole prn and trying not to scratch  Will return to derm if further problem  Also disc poss of anxiety worsening symptoms

## 2012-12-08 NOTE — Assessment & Plan Note (Addendum)
BP: 124/72 mmHg  bp in fair control at this time  No changes needed  Disc lifstyle change with low sodium diet and exercise   Pt is very worried about bp going up with stress and I reassured her

## 2012-12-09 DIAGNOSIS — R229 Localized swelling, mass and lump, unspecified: Secondary | ICD-10-CM | POA: Diagnosis not present

## 2012-12-10 ENCOUNTER — Telehealth: Payer: Self-pay

## 2012-12-10 NOTE — Telephone Encounter (Signed)
Pt to have outpatient surgery on 12/24/12; on 12/23/12 pt is invited to a church dinner that evening and pt wants to make sure can take Loperamide before the church meal since having outpatient surgery on 12/24/12. Pt wants to make sure OK with Dr Milinda Antis.

## 2012-12-10 NOTE — Telephone Encounter (Signed)
That sounds fine if she needs it

## 2012-12-13 ENCOUNTER — Telehealth: Payer: Self-pay | Admitting: *Deleted

## 2012-12-13 NOTE — Telephone Encounter (Addendum)
Pt asked if diabetic shoes were in, what they would cost and when she could pick them up?  12/14/2012, called [phone] no voicemail.  I informed pt her shoes were not in yet, we would send a card with our name on it instruction the pt to make an appt to try on the shoes.  Pt states understanding.

## 2012-12-14 ENCOUNTER — Emergency Department (HOSPITAL_COMMUNITY)
Admission: EM | Admit: 2012-12-14 | Discharge: 2012-12-14 | Disposition: A | Discharge status: Home / Self Care | Payer: Medicare Other | Attending: Emergency Medicine | Admitting: Emergency Medicine

## 2012-12-14 ENCOUNTER — Encounter (HOSPITAL_COMMUNITY): Payer: Self-pay | Admitting: Emergency Medicine

## 2012-12-14 DIAGNOSIS — Z79899 Other long term (current) drug therapy: Secondary | ICD-10-CM | POA: Diagnosis not present

## 2012-12-14 DIAGNOSIS — E119 Type 2 diabetes mellitus without complications: Secondary | ICD-10-CM | POA: Diagnosis not present

## 2012-12-14 DIAGNOSIS — K219 Gastro-esophageal reflux disease without esophagitis: Secondary | ICD-10-CM | POA: Diagnosis not present

## 2012-12-14 DIAGNOSIS — R51 Headache: Secondary | ICD-10-CM | POA: Insufficient documentation

## 2012-12-14 DIAGNOSIS — E785 Hyperlipidemia, unspecified: Secondary | ICD-10-CM | POA: Diagnosis not present

## 2012-12-14 DIAGNOSIS — G8929 Other chronic pain: Secondary | ICD-10-CM | POA: Diagnosis not present

## 2012-12-14 DIAGNOSIS — E669 Obesity, unspecified: Secondary | ICD-10-CM | POA: Insufficient documentation

## 2012-12-14 DIAGNOSIS — Z8781 Personal history of (healed) traumatic fracture: Secondary | ICD-10-CM | POA: Diagnosis not present

## 2012-12-14 DIAGNOSIS — Z880 Allergy status to penicillin: Secondary | ICD-10-CM | POA: Diagnosis not present

## 2012-12-14 DIAGNOSIS — Z86011 Personal history of benign neoplasm of the brain: Secondary | ICD-10-CM | POA: Diagnosis not present

## 2012-12-14 DIAGNOSIS — M5137 Other intervertebral disc degeneration, lumbosacral region: Secondary | ICD-10-CM | POA: Insufficient documentation

## 2012-12-14 DIAGNOSIS — Z7982 Long term (current) use of aspirin: Secondary | ICD-10-CM | POA: Insufficient documentation

## 2012-12-14 DIAGNOSIS — Z8619 Personal history of other infectious and parasitic diseases: Secondary | ICD-10-CM | POA: Diagnosis not present

## 2012-12-14 DIAGNOSIS — M19079 Primary osteoarthritis, unspecified ankle and foot: Secondary | ICD-10-CM | POA: Insufficient documentation

## 2012-12-14 DIAGNOSIS — R011 Cardiac murmur, unspecified: Secondary | ICD-10-CM | POA: Diagnosis not present

## 2012-12-14 DIAGNOSIS — H409 Unspecified glaucoma: Secondary | ICD-10-CM | POA: Diagnosis not present

## 2012-12-14 DIAGNOSIS — Z872 Personal history of diseases of the skin and subcutaneous tissue: Secondary | ICD-10-CM | POA: Insufficient documentation

## 2012-12-14 DIAGNOSIS — M51379 Other intervertebral disc degeneration, lumbosacral region without mention of lumbar back pain or lower extremity pain: Secondary | ICD-10-CM | POA: Insufficient documentation

## 2012-12-14 NOTE — ED Provider Notes (Signed)
CSN: [id number]     Arrival date & time 12/14/12  0125 History   First MD Initiated Contact with Patient 12/14/12 0243     Chief Complaint  Patient presents with  . Sinus Trouble    HPI  History provided by the patient. Patient is a 57 year old female who presents with complaints of episode of bilateral frontal sinus pain and pressure. Patient states that she fell slightly congested with some difficulty sleeping. Around 1 AM she began to feel some pressure and pains around her frontal sinus and forehead area. Patient was slightly concerned about this discomfort and decided to come to the emergency department. She reports having several sneezing episodes prior to arrival which did suddenly relieve her pain and pressure symptoms. Currently she denies any symptoms. She does also mention some recent dry nose and nose bleeds. She did use a humidifier in her house yesterday evening but has not used tonight. She denies any runny nose or sore throat symptoms. No recent fever, chills or sweats. No cough symptoms. Denies any eye pain or vision problem. No other aggravating or alleviating factors. No other associated symptoms.     Past Medical History  Diagnosis Date  . Allergic rhinitis, cause unspecified   . Pain in joint, ankle and foot   . Backache, unspecified     chronic LBP with radiculopathy  . Seborrheic dermatitis, unspecified   . Type II or unspecified type diabetes mellitus without mention of complication, not stated as uncontrolled   . History of fracture of arm     Right  . History of fracture of foot   . Dermatomycosis, unspecified   . Esophageal reflux   . Unspecified glaucoma(365.9)   . Undiagnosed cardiac murmurs   . Diaphragmatic hernia without mention of obstruction or gangrene   . Other and unspecified hyperlipidemia   . Unspecified essential hypertension   . Irritable bowel syndrome   . Benign neoplasm of cerebral meninges   . Moderate intellectual disabilities   .  Nausea alone   . Osteoarthrosis, unspecified whether generalized or localized, ankle and foot   . Pruritus ani   . Unspecified tinnitus   . Nonspecific elevation of levels of transaminase or lactic acid dehydrogenase (LDH)   . Leukorrhea, not specified as infective   . Obesity   . Hyperglycemia   . DDD (degenerative disc disease), lumbar   . Yeast infection of the skin     Frequent   Past Surgical History  Procedure Laterality Date  . Total abdominal hysterectomy    . Cataract extraction    . Elbow surgery      right elbow   Family History  Problem Relation Age of Onset  . Diabetes Mother   . Diabetes Maternal Aunt   . Heart disease Maternal Aunt      (had pacemaker)  . Diabetes Maternal Grandmother   . Stroke Mother   . Stroke Father   . Stroke Maternal Grandmother    History  Substance Use Topics  . Smoking status: Never Smoker   . Smokeless tobacco: Never Used  . Alcohol Use: No   OB History   Grav Para Term Preterm Abortions TAB SAB Ect Mult Living                 Review of Systems  Constitutional: Negative for fever and chills.  Eyes: Negative for visual disturbance.  Respiratory: Negative for cough and shortness of breath.   Neurological: Negative for dizziness, weakness, light-headedness and numbness.  All other systems reviewed and are negative.    Allergies  Ace inhibitors; Amoxicillin-pot clavulanate; Augmentin; Azithromycin; Cyclobenzaprine hcl; Flexeril; Flonase; and Ibuprofen  Home Medications   Current Outpatient Rx  Name  Route  Sig  Dispense  Refill  . aspirin EC 81 MG tablet   Oral   Take 81 mg by mouth daily.         Marland Kitchen desonide (DESOWEN) 0.05 % ointment   Topical   Apply 1 application topically 2 (two) times daily.         Marland Kitchen diltiazem (TIAZAC) 240 MG 24 hr capsule   Oral   Take 240 mg by mouth daily.         . hydrochlorothiazide (MICROZIDE) 12.5 MG capsule   Oral   Take 1 capsule (12.5 mg total) by mouth daily.   90  capsule   1   . ketoconazole (NIZORAL) 2 % shampoo   Topical   Apply 1 application topically 2 (two) times a week.   120 mL   3   . latanoprost (XALATAN) 0.005 % ophthalmic solution   Both Eyes   Place 1 drop into both eyes at bedtime.          . metFORMIN (GLUCOPHAGE) 500 MG tablet   Oral   Take 500 mg by mouth 2 (two) times daily with a meal.         . omeprazole (PRILOSEC) 20 MG capsule   Oral   Take 1 capsule (20 mg total) by mouth 2 (two) times daily.   180 capsule   1   . potassium chloride SA (K-DUR,KLOR-CON) 20 MEQ tablet   Oral   Take 1 tablet (20 mEq total) by mouth daily.   90 tablet   3   . pravastatin (PRAVACHOL) 40 MG tablet   Oral   Take 1 tablet (40 mg total) by mouth every evening.   90 tablet   3   . Propylene Glycol (SYSTANE BALANCE OP)   Ophthalmic   Apply 2 drops to eye 2 (two) times daily as needed (dry eyes).          . sodium chloride (OCEAN) 0.65 % nasal spray   Nasal   Place 1 spray into the nose as needed for congestion.          BP 147/74  Pulse 105  Temp(Src) 98.4 F (36.9 C) (Oral)  Resp 16  Ht 5\' 2"  (1.575 m)  Wt 202 lb 9 oz (91.882 kg)  BMI 37.04 kg/m2  SpO2 96% Physical Exam  Nursing note and vitals reviewed. Constitutional: She is oriented to person, place, and time. She appears well-developed and well-nourished. No distress.  HENT:  Head: Normocephalic and atraumatic.  Right Ear: Tympanic membrane normal.  Left Ear: Tympanic membrane normal.  Eyes: Conjunctivae and EOM are normal. Pupils are equal, round, and reactive to light.  Neck: Normal range of motion. Neck supple.  Cardiovascular: Normal rate and regular rhythm.   No murmur heard. Pulmonary/Chest: Effort normal and breath sounds normal. No respiratory distress. She has no wheezes. She has no rales. She exhibits no tenderness.  Abdominal: Soft. She exhibits no distension. There is no tenderness. There is no rigidity, no rebound, no guarding, no CVA  tenderness and no tenderness at McBurney's point.  Musculoskeletal: Normal range of motion.  Neurological: She is alert and oriented to person, place, and time. She has normal strength. No cranial nerve deficit or sensory deficit. Gait normal.  Skin: Skin is  warm and dry. No rash noted.  Psychiatric: She has a normal mood and affect. Her behavior is normal.    ED Course  Procedures   DIAGNOSTIC STUDIES: Oxygen Saturation is 96% on room air.    COORDINATION OF CARE:  Nursing notes reviewed. Vital signs reviewed. Initial pt interview and examination performed.   3:15 AM patient seen and evaluated. Patient is well-appearing no acute distress. Currently asymptomatic. Normal nonfocal neuro exam. Concerning findings on her examination. Patient does report some sinus pressure and sneezing episode. Do not suspect any concerning or emergent condition. At this time patient instructed to monitor symptoms and followup with PCP. She agrees with plan.      MDM   1. Sinus headache        Angus Seller, PA-C 12/14/12 [phone]

## 2012-12-14 NOTE — ED Provider Notes (Signed)
Medical screening examination/treatment/procedure(s) were performed by non-physician practitioner and as supervising physician I was immediately available for consultation/collaboration.    Olivia Mackie, MD 12/14/12 [phone]

## 2012-12-14 NOTE — Telephone Encounter (Signed)
Pt.notified

## 2012-12-14 NOTE — ED Notes (Signed)
Patient here due to feeling like her sinuses are full  Sneezing before coming to the hospital

## 2012-12-15 ENCOUNTER — Ambulatory Visit (INDEPENDENT_AMBULATORY_CARE_PROVIDER_SITE_OTHER): Payer: Medicare Other | Admitting: Family Medicine

## 2012-12-15 ENCOUNTER — Encounter: Payer: Self-pay | Admitting: Family Medicine

## 2012-12-15 VITALS — BP 124/82 | HR 92 | Temp 98.3°F | Ht 62.0 in | Wt 197.5 lb

## 2012-12-15 DIAGNOSIS — R51 Headache: Secondary | ICD-10-CM | POA: Diagnosis not present

## 2012-12-15 DIAGNOSIS — B372 Candidiasis of skin and nail: Secondary | ICD-10-CM | POA: Diagnosis not present

## 2012-12-15 MED ORDER — NYSTATIN 100000 UNIT/GM EX POWD: CUTANEOUS | Status: DC

## 2012-12-15 NOTE — Assessment & Plan Note (Signed)
Pt was seen 11/25 in ED for sinus ha that developed with sneezing Exam nl here and there and records reviewed  She is improved today-this may have been allergy related  Will continue nasal saline spray as needed and update me if symptoms return

## 2012-12-15 NOTE — Progress Notes (Signed)
Subjective:    Patient ID: [name], female    DOB: 03/14/1955, 57 y.o.   MRN: [id number]  HPI Here for follow up of ED visit yesterday for sinus congestion  Sinus symptoms started Monday am about 1:00 Facial pain and aching  She sneezed several times and after she sneezed it got better  Used nasal saline spray   Now feels better - just occasionally sneezing Clear mucous No fever  No pain in face or sinuses     Pt has MR - and tends to go to the ER for any symptoms   Was dx with congestion - and nothing more than symptomatic care  She thinks it could have been a cold /allergies - or symptoms related to change in weather and temp   Her lipoma surgery is thurs Dec 7 Has already had her pre op labs  She knows to hold her metformin for that day  Also needs refill on nystop powder  Has POA - Linda Thompson - will go home with her after her surgery  Reviewed ER notes today with pt   Patient Active Problem List   Diagnosis Date Noted  . Stress reaction 12/07/2012  . Low back pain 11/22/2012  . Lipoma of axilla 11/10/2012  . Labial swelling 11/03/2012  . Vaginal irritation 10/14/2012  . Diarrhea 06/23/2012  . Seborrheic keratosis 04/23/2012  . Chronic dermatitis of hands 09/12/2011  . Obesity 05/05/2011  . Candidal intertrigo 11/15/2010  . Irritable bowel syndrome 03/05/2010  . HEART MURMUR 10/12/2009  . TRANSAMINASES, SERUM, ELEVATED 11/22/2007  . DIABETES MELLITUS, TYPE II 08/13/2007  . ALLERGIC  RHINITIS 09/23/2006  . FUNGAL DERMATITIS 08/26/2006  . DERMATITIS, SEBORRHEIC NOS 08/26/2006  . HYPERLIPIDEMIA 03/11/2006  . MENTAL RETARDATION, MODERATE 03/11/2006  . GLAUCOMA NOS 03/11/2006  . HYPERTENSION 03/11/2006  . GERD 03/11/2006  . HIATAL HERNIA 03/11/2006  . PRURITUS ANI 03/11/2006  . OSTEOARTHRITIS, FOOT 03/11/2006  . Personal history of other musculoskeletal disorders(V13.59) 03/11/2006  . MENINGIOMA 12/20/2001   Past Medical History  Diagnosis Date    . Allergic rhinitis, cause unspecified   . Pain in joint, ankle and foot   . Backache, unspecified     chronic LBP with radiculopathy  . Seborrheic dermatitis, unspecified   . Type II or unspecified type diabetes mellitus without mention of complication, not stated as uncontrolled   . History of fracture of arm     Right  . History of fracture of foot   . Dermatomycosis, unspecified   . Esophageal reflux   . Unspecified glaucoma(365.9)   . Undiagnosed cardiac murmurs   . Diaphragmatic hernia without mention of obstruction or gangrene   . Other and unspecified hyperlipidemia   . Unspecified essential hypertension   . Irritable bowel syndrome   . Benign neoplasm of cerebral meninges   . Moderate intellectual disabilities   . Nausea alone   . Osteoarthrosis, unspecified whether generalized or localized, ankle and foot   . Pruritus ani   . Unspecified tinnitus   . Nonspecific elevation of levels of transaminase or lactic acid dehydrogenase (LDH)   . Leukorrhea, not specified as infective   . Obesity   . Hyperglycemia   . DDD (degenerative disc disease), lumbar   . Yeast infection of the skin     Frequent   Past Surgical History  Procedure Laterality Date  . Total abdominal hysterectomy    . Cataract extraction    . Elbow surgery  right elbow   History  Substance Use Topics  . Smoking status: Never Smoker   . Smokeless tobacco: Never Used  . Alcohol Use: No   Family History  Problem Relation Age of Onset  . Diabetes Mother   . Diabetes Maternal Aunt   . Heart disease Maternal Aunt      (had pacemaker)  . Diabetes Maternal Grandmother   . Stroke Mother   . Stroke Father   . Stroke Maternal Grandmother    Allergies  Allergen Reactions  . Ace Inhibitors     REACTION: COUGH  . Amoxicillin-Pot Clavulanate     REACTION: diarrhea  . Augmentin [Amoxicillin-Pot Clavulanate] Other (See Comments)  . Azithromycin Diarrhea  . Cyclobenzaprine Hcl     REACTION:  diarrhea  . Flexeril [Cyclobenzaprine]   . Flonase [Fluticasone Propionate]     nosebleed  . Ibuprofen     REACTION: vomiting   Current Outpatient Prescriptions on File Prior to Visit  Medication Sig Dispense Refill  . aspirin EC 81 MG tablet Take 81 mg by mouth daily.      Marland Kitchen desonide (DESOWEN) 0.05 % ointment Apply 1 application topically 2 (two) times daily.      Marland Kitchen diltiazem (TIAZAC) 240 MG 24 hr capsule Take 240 mg by mouth daily.      . hydrochlorothiazide (MICROZIDE) 12.5 MG capsule Take 1 capsule (12.5 mg total) by mouth daily.  90 capsule  1  . ketoconazole (NIZORAL) 2 % shampoo Apply 1 application topically 2 (two) times a week.  120 mL  3  . latanoprost (XALATAN) 0.005 % ophthalmic solution Place 1 drop into both eyes at bedtime.       . metFORMIN (GLUCOPHAGE) 500 MG tablet Take 500 mg by mouth 2 (two) times daily with a meal.      . omeprazole (PRILOSEC) 20 MG capsule Take 1 capsule (20 mg total) by mouth 2 (two) times daily.  180 capsule  1  . potassium chloride SA (K-DUR,KLOR-CON) 20 MEQ tablet Take 1 tablet (20 mEq total) by mouth daily.  90 tablet  3  . pravastatin (PRAVACHOL) 40 MG tablet Take 1 tablet (40 mg total) by mouth every evening.  90 tablet  3  . Propylene Glycol (SYSTANE BALANCE OP) Apply 2 drops to eye 2 (two) times daily as needed (dry eyes).       . sodium chloride (OCEAN) 0.65 % nasal spray Place 1 spray into the nose as needed for congestion.      . [DISCONTINUED] Calcium Carbonate-Vitamin D (CALCIUM-VITAMIN D) 500-200 MG-UNIT per tablet Take 1 tablet by mouth 2 (two) times daily with a meal.        . [DISCONTINUED] fexofenadine (ALLEGRA) 180 MG tablet Take 1 tablet (180 mg total) by mouth daily.  15 tablet  0   No current facility-administered medications on file prior to visit.    Review of Systems Review of Systems  Constitutional: Negative for fever, appetite change, fatigue and unexpected weight change.  Eyes: Negative for pain and visual disturbance.    ENT neg for sinus pain or purulent drainage/ pos for occasional sneezing  Respiratory: Negative for cough and shortness of breath.   Cardiovascular: Negative for cp or palpitations    Gastrointestinal: Negative for nausea, diarrhea and constipation.  Genitourinary: Negative for urgency and frequency.  Skin: Negative for pallor or rash   Neurological: Negative for weakness, light-headedness, numbness and headaches.  Hematological: Negative for adenopathy. Does not bruise/bleed easily.  Psychiatric/Behavioral: Negative for  dysphoric mood. The patient is not nervous/anxious.         Objective:   Physical Exam  Constitutional: She appears well-developed and well-nourished. No distress.  obese and well appearing   HENT:  Head: Normocephalic and atraumatic.  Right Ear: External ear normal.  Left Ear: External ear normal.  Mouth/Throat: Oropharynx is clear and moist. No oropharyngeal exudate.  Nares are boggy with some clear rhinorrhea   No sinus tenderness  Eyes: Conjunctivae are normal. Pupils are equal, round, and reactive to light. Right eye exhibits no discharge. Left eye exhibits no discharge.  Neck: Normal range of motion. Neck supple.  Cardiovascular: Normal rate and regular rhythm.   Pulmonary/Chest: Effort normal and breath sounds normal. No respiratory distress. She has no wheezes. She has no rales.  Lymphadenopathy:    She has no cervical adenopathy.  Neurological: She is alert. No cranial nerve deficit.  Skin: Skin is warm and dry. No rash noted.  Psychiatric: She has a normal mood and affect.  Baseline MR Repeats phrases often Pleasant           Assessment & Plan:

## 2012-12-15 NOTE — Patient Instructions (Signed)
If you develop sinus pain -continue to use the nasal saline and let me know if it does not improve in 1-2 days  If fever or other symptoms please call  You may have allergies causing sneezing  I'm glad you are doing better now

## 2012-12-15 NOTE — Assessment & Plan Note (Signed)
Improved today Refilled nystop powder for use under breasts and in groin as needed  Adv to keep areas dry

## 2012-12-15 NOTE — Progress Notes (Signed)
Pre-visit discussion using our clinic review tool. No additional management support is needed unless otherwise documented below in the visit note.  

## 2012-12-19 ENCOUNTER — Encounter (HOSPITAL_COMMUNITY): Payer: Self-pay | Admitting: Emergency Medicine

## 2012-12-19 ENCOUNTER — Emergency Department (HOSPITAL_COMMUNITY): Payer: Medicare Other

## 2012-12-19 ENCOUNTER — Emergency Department (HOSPITAL_COMMUNITY)
Admission: EM | Admit: 2012-12-19 | Discharge: 2012-12-19 | Disposition: A | Discharge status: Home / Self Care | Payer: Medicare Other | Attending: Emergency Medicine | Admitting: Emergency Medicine

## 2012-12-19 DIAGNOSIS — H409 Unspecified glaucoma: Secondary | ICD-10-CM | POA: Insufficient documentation

## 2012-12-19 DIAGNOSIS — R05 Cough: Secondary | ICD-10-CM | POA: Diagnosis not present

## 2012-12-19 DIAGNOSIS — R6889 Other general symptoms and signs: Secondary | ICD-10-CM | POA: Diagnosis not present

## 2012-12-19 DIAGNOSIS — Z79899 Other long term (current) drug therapy: Secondary | ICD-10-CM | POA: Diagnosis not present

## 2012-12-19 DIAGNOSIS — R0982 Postnasal drip: Secondary | ICD-10-CM | POA: Insufficient documentation

## 2012-12-19 DIAGNOSIS — Z8739 Personal history of other diseases of the musculoskeletal system and connective tissue: Secondary | ICD-10-CM | POA: Diagnosis not present

## 2012-12-19 DIAGNOSIS — R011 Cardiac murmur, unspecified: Secondary | ICD-10-CM | POA: Diagnosis not present

## 2012-12-19 DIAGNOSIS — E119 Type 2 diabetes mellitus without complications: Secondary | ICD-10-CM | POA: Insufficient documentation

## 2012-12-19 DIAGNOSIS — Z7982 Long term (current) use of aspirin: Secondary | ICD-10-CM | POA: Diagnosis not present

## 2012-12-19 DIAGNOSIS — Z862 Personal history of diseases of the blood and blood-forming organs and certain disorders involving the immune mechanism: Secondary | ICD-10-CM | POA: Insufficient documentation

## 2012-12-19 DIAGNOSIS — Z8639 Personal history of other endocrine, nutritional and metabolic disease: Secondary | ICD-10-CM | POA: Insufficient documentation

## 2012-12-19 DIAGNOSIS — J3489 Other specified disorders of nose and nasal sinuses: Secondary | ICD-10-CM | POA: Insufficient documentation

## 2012-12-19 DIAGNOSIS — Z8669 Personal history of other diseases of the nervous system and sense organs: Secondary | ICD-10-CM | POA: Diagnosis not present

## 2012-12-19 DIAGNOSIS — Z8619 Personal history of other infectious and parasitic diseases: Secondary | ICD-10-CM | POA: Diagnosis not present

## 2012-12-19 DIAGNOSIS — R059 Cough, unspecified: Secondary | ICD-10-CM | POA: Diagnosis not present

## 2012-12-19 DIAGNOSIS — K219 Gastro-esophageal reflux disease without esophagitis: Secondary | ICD-10-CM | POA: Insufficient documentation

## 2012-12-19 DIAGNOSIS — I10 Essential (primary) hypertension: Secondary | ICD-10-CM | POA: Diagnosis not present

## 2012-12-19 DIAGNOSIS — Z872 Personal history of diseases of the skin and subcutaneous tissue: Secondary | ICD-10-CM | POA: Insufficient documentation

## 2012-12-19 DIAGNOSIS — R079 Chest pain, unspecified: Secondary | ICD-10-CM | POA: Diagnosis not present

## 2012-12-19 DIAGNOSIS — E669 Obesity, unspecified: Secondary | ICD-10-CM | POA: Insufficient documentation

## 2012-12-19 LAB — GLUCOSE, CAPILLARY: Glucose-Capillary: 110 mg/dL — ABNORMAL HIGH (ref 70–99)

## 2012-12-19 NOTE — ED Notes (Signed)
Patient transported to X-ray 

## 2012-12-19 NOTE — ED Notes (Signed)
Reports having sinus congestion, drainage and productive cough since last night, with white sputum. Airway intact, no acute distress noted.

## 2012-12-19 NOTE — ED Provider Notes (Signed)
CSN: [id number]     Arrival date & time 12/19/12  0715 History   First MD Initiated Contact with Patient 12/19/12 [phone]     Chief Complaint  Patient presents with  . Cough    HPI  Linda Thompson is a 57 y.o. female with a PMH of allergic rhinitis, back pain, seborrheic dermatitis, DM, esophageal reflux, heart murmur, diaphragmatic hernia, HLD, HTN, IBS, benign neoplasm of cerebral meninges, moderate intellectual disabilities, OA, obesity, hyperglycemia, and DDD who presents to the ED for evaluation of cough.  History was provided by the patient.  Patient states that she has had sinus pressure and nasal congestion off-and-on for the past week. She states that last night she developed a cough while sleeping and got up and went to the bathroom and coughed up some white sputum. She states that her cough has since resolved. She denies any shortness of breath or chest pain. She denies any history of asthma or COPD. She denies any orthopnea. She denies any shortness of breath with exertion. She denies any history of tobacco use. She denies any hemoptysis. She states that she was worried about this cough and wanted to go to the emergency room to just get "checked out".  She was seen in the emergency room a few days ago for sinus congestion (12/14/12). She followed up with her primary doctor (12/15/12) and was instructed to use saline nose spray which has been improving her sinus pressure. She denies any sinus pressure or headache currently. She denies any vision changes, ear pain or fullness, neck pain or stiffness, abdominal pain, nausea, vomiting, diarrhea, constipation, dysuria, or leg edema.  No recent travel/hx of cancer.     Past Medical History  Diagnosis Date  . Allergic rhinitis, cause unspecified   . Pain in joint, ankle and foot   . Backache, unspecified     chronic LBP with radiculopathy  . Seborrheic dermatitis, unspecified   . Type II or unspecified type diabetes mellitus without mention of  complication, not stated as uncontrolled   . History of fracture of arm     Right  . History of fracture of foot   . Dermatomycosis, unspecified   . Esophageal reflux   . Unspecified glaucoma(365.9)   . Undiagnosed cardiac murmurs   . Diaphragmatic hernia without mention of obstruction or gangrene   . Other and unspecified hyperlipidemia   . Unspecified essential hypertension   . Irritable bowel syndrome   . Benign neoplasm of cerebral meninges   . Moderate intellectual disabilities   . Nausea alone   . Osteoarthrosis, unspecified whether generalized or localized, ankle and foot   . Pruritus ani   . Unspecified tinnitus   . Nonspecific elevation of levels of transaminase or lactic acid dehydrogenase (LDH)   . Leukorrhea, not specified as infective   . Obesity   . Hyperglycemia   . DDD (degenerative disc disease), lumbar   . Yeast infection of the skin     Frequent   Past Surgical History  Procedure Laterality Date  . Total abdominal hysterectomy    . Cataract extraction    . Elbow surgery      right elbow   Family History  Problem Relation Age of Onset  . Diabetes Mother   . Diabetes Maternal Aunt   . Heart disease Maternal Aunt      (had pacemaker)  . Diabetes Maternal Grandmother   . Stroke Mother   . Stroke Father   . Stroke Maternal Grandmother  History  Substance Use Topics  . Smoking status: Never Smoker   . Smokeless tobacco: Never Used  . Alcohol Use: No   OB History   Grav Para Term Preterm Abortions TAB SAB Ect Mult Living                 Review of Systems  Constitutional: Negative for fever, chills, diaphoresis, activity change, appetite change and fatigue.  HENT: Positive for congestion, postnasal drip, sinus pressure and sneezing. Negative for drooling, ear pain, mouth sores, rhinorrhea, sore throat, trouble swallowing and voice change.   Eyes: Negative for visual disturbance.  Respiratory: Positive for cough. Negative for choking, chest  tightness, shortness of breath and wheezing.   Cardiovascular: Negative for chest pain and leg swelling.  Gastrointestinal: Negative for nausea, vomiting, abdominal pain, diarrhea and constipation.  Genitourinary: Negative for dysuria.  Musculoskeletal: Negative for back pain and myalgias.  Skin: Negative for color change.  Neurological: Negative for dizziness, weakness, light-headedness, numbness and headaches.    Allergies  Ace inhibitors; Amoxicillin-pot clavulanate; Augmentin; Azithromycin; Cyclobenzaprine hcl; Flexeril; Flonase; and Ibuprofen  Home Medications   Current Outpatient Rx  Name  Route  Sig  Dispense  Refill  . aspirin EC 81 MG tablet   Oral   Take 81 mg by mouth daily.         Marland Kitchen desonide (DESOWEN) 0.05 % ointment   Topical   Apply 1 application topically 2 (two) times daily.         Marland Kitchen diltiazem (TIAZAC) 240 MG 24 hr capsule   Oral   Take 240 mg by mouth daily.         . hydrochlorothiazide (MICROZIDE) 12.5 MG capsule   Oral   Take 1 capsule (12.5 mg total) by mouth daily.   90 capsule   1   . ketoconazole (NIZORAL) 2 % shampoo   Topical   Apply 1 application topically 2 (two) times a week.   120 mL   3   . latanoprost (XALATAN) 0.005 % ophthalmic solution   Both Eyes   Place 1 drop into both eyes at bedtime.          . metFORMIN (GLUCOPHAGE) 500 MG tablet   Oral   Take 500 mg by mouth 2 (two) times daily with a meal.         . nystatin (MYCOSTATIN/NYSTOP) 100000 UNIT/GM POWD      Apply to affected areas with yeast three times daily as needed   2 Bottle   3   . omeprazole (PRILOSEC) 20 MG capsule   Oral   Take 1 capsule (20 mg total) by mouth 2 (two) times daily.   180 capsule   1   . potassium chloride SA (K-DUR,KLOR-CON) 20 MEQ tablet   Oral   Take 1 tablet (20 mEq total) by mouth daily.   90 tablet   3   . pravastatin (PRAVACHOL) 40 MG tablet   Oral   Take 1 tablet (40 mg total) by mouth every evening.   90 tablet    3   . Propylene Glycol (SYSTANE BALANCE OP)   Ophthalmic   Apply 2 drops to eye 2 (two) times daily as needed (dry eyes).          . sodium chloride (OCEAN) 0.65 % nasal spray   Nasal   Place 1 spray into the nose as needed for congestion.          BP 144/85  Pulse 108  Temp(Src) 98.7 F (37.1 C) (Oral)  Resp 22  Wt 197 lb (89.359 kg)  SpO2 97%  Filed Vitals:   12/19/12 0720 12/19/12 0935 12/19/12 0936  BP: 144/85 122/57   Pulse: 108  97  Temp: 98.7 F (37.1 C)    TempSrc: Oral    Resp: 22 26 22   Weight: 197 lb (89.359 kg)    SpO2: 97%  97%    Physical Exam  Nursing note and vitals reviewed. Constitutional: She is oriented to person, place, and time. She appears well-developed and well-nourished. No distress.  HENT:  Head: Normocephalic and atraumatic.  Right Ear: External ear normal.  Left Ear: External ear normal.  Nose: Nose normal.  Mouth/Throat: Oropharynx is clear and moist. No oropharyngeal exudate.  Tm's gray and translucent bilaterally.  No sinus tenderness throughout.    Eyes: Conjunctivae are normal. Right eye exhibits no discharge. Left eye exhibits no discharge.  Neck: Normal range of motion. Neck supple.  Cardiovascular: Normal rate, regular rhythm, normal heart sounds and intact distal pulses.  Exam reveals no gallop and no friction rub.   No murmur heard. Pulmonary/Chest: Effort normal and breath sounds normal. No respiratory distress. She has no wheezes. She has no rales. She exhibits no tenderness.  Fine crackles heard in the right lung base.  No wheezing.    Abdominal: Soft. Bowel sounds are normal. She exhibits no distension and no mass. There is no tenderness. There is no rebound and no guarding.  Musculoskeletal: Normal range of motion. She exhibits edema. She exhibits no tenderness.  Trace pitting edema equal bilaterally  Neurological: She is alert and oriented to person, place, and time.  Skin: Skin is warm and dry. She is not  diaphoretic.    ED Course  Procedures (including critical care time) Labs Review Labs Reviewed - No data to display Imaging Review No results found.  EKG Interpretation    Date/Time:  Sunday December 19 2012 08:49:57 EST Ventricular Rate:  94 PR Interval:  155 QRS Duration: 103 QT Interval:  356 QTC Calculation: 445 R Axis:   6 Text Interpretation:  Sinus rhythm Baseline wander in lead(s) V2 No significant change since last tracing Confirmed by Anitra Lauth  MD, WHITNEY ([id number]) on 12/19/2012 8:58:18 AM                DG Chest 2 View (Final result)  Result time: 12/19/12 08:39:01    Final result by Rad Results In Interface (12/19/12 08:39:01)    Narrative:   CLINICAL DATA: Cough, chest pain  EXAM: CHEST - 2 VIEW  COMPARISON: 11/05/2012  FINDINGS: The heart size and mediastinal contours are within normal limits. Both lungs are clear. The visualized skeletal structures are unremarkable. No effusion. No pneumothorax.  IMPRESSION: No acute cardiopulmonary disease.   Electronically Signed By: Oley Balm M.D. On: 12/19/2012 08:39        Results for orders placed during the hospital encounter of 12/19/12  GLUCOSE, CAPILLARY      Result Value Range   Glucose-Capillary 110 (*) 70 - 99 mg/dL    MDM   Linda Thompson is a 57 y.o. female with a PMH of allergic rhinitis, back pain, seborrheic dermatitis, DM, esophageal reflux, heart murmur, diaphragmatic hernia, HLD, HTN, IBS, benign neoplasm of cerebral meninges, moderate intellectual disabilities, OA, obesity, hyperglycemia, and DDD who presents to the ED for evaluation of cough .     Rechecks  9:24 AM = Patient resting comfortably.  No cough, SOB, or chest pain.  9:44 AM = Spoke with patient about HR.  She states she feels a little anxious.  HR 90.  RR 18.  Patient states she feels fine.  Ready for discharge.      Etiology of cough possibly due to postnasal drip, nasal congestion, and sinus pressure.  Patient's cough has since resolved after last night coughing episodes. Her chest x-ray was negative for an acute cardiopulmonary process. She had no symptoms of shortness of breath or chest pain. An EKG was performed due to her risk factors for cardiac disease. Her EKG was negative for any acute ischemic changes. Her vital signs are stable with no hypoxia or tachypnea. Patient has mild bilateral leg edema she states is her baseline.  Wells score 0.  No hypoxia.  Patient instructed to follow-up with her PCP for further evaluation and management.  Return precautions were discussed.  Patient in agreement with discharge and plan.     Discharge Medication List as of 12/19/2012  9:27 AM       Final impressions: 1. Cough       Thomasenia Sales          Jillyn Ledger, [place] 12/21/12 [phone]

## 2012-12-20 ENCOUNTER — Ambulatory Visit (INDEPENDENT_AMBULATORY_CARE_PROVIDER_SITE_OTHER): Payer: Medicare Other | Admitting: Family Medicine

## 2012-12-20 ENCOUNTER — Encounter: Payer: Self-pay | Admitting: Family Medicine

## 2012-12-20 VITALS — BP 118/72 | HR 94 | Temp 98.1°F | Ht 62.0 in | Wt 199.5 lb

## 2012-12-20 DIAGNOSIS — J069 Acute upper respiratory infection, unspecified: Secondary | ICD-10-CM | POA: Insufficient documentation

## 2012-12-20 NOTE — Progress Notes (Signed)
Pre-visit discussion using our clinic review tool. No additional management support is needed unless otherwise documented below in the visit note.  

## 2012-12-20 NOTE — Assessment & Plan Note (Signed)
Reviewed ER notes/ studies from 11/30- reassuring with clear cxr  Disc viral uri and sympt care Given easy to read handout on viral uri  Will continue nasal saline as needed  Enc her to spit out mucous so she does not get GI upset She has upcoming surgery for lipoma on Friday- inst pt to call if not feeling better / cough wise or fever and to call if worse at any time

## 2012-12-20 NOTE — Progress Notes (Signed)
Subjective:    Patient ID: Linda Thompson, female    DOB: 09/16/1955, 57 y.o.   MRN: [id number]  HPI Here for f/u from ER visit for cough on 11/30  Per ER note -pt coughed several times that night and spit up some sputum W/u was neg -with clear cxr as well  Cough is not bad now  Some mucous this am - all clear     Of note-was seen the week before for sinus congestion and headache  That is better  Continues to use nasal saline - that made her a little nauseated this am -no vomiting  No fever or other symptoms   Patient Active Problem List   Diagnosis Date Noted  . Sinus headache 12/15/2012  . Stress reaction 12/07/2012  . Low back pain 11/22/2012  . Lipoma of axilla 11/10/2012  . Labial swelling 11/03/2012  . Vaginal irritation 10/14/2012  . Diarrhea 06/23/2012  . Seborrheic keratosis 04/23/2012  . Chronic dermatitis of hands 09/12/2011  . Obesity 05/05/2011  . Candidal intertrigo 11/15/2010  . Irritable bowel syndrome 03/05/2010  . HEART MURMUR 10/12/2009  . TRANSAMINASES, SERUM, ELEVATED 11/22/2007  . DIABETES MELLITUS, TYPE II 08/13/2007  . ALLERGIC  RHINITIS 09/23/2006  . FUNGAL DERMATITIS 08/26/2006  . DERMATITIS, SEBORRHEIC NOS 08/26/2006  . HYPERLIPIDEMIA 03/11/2006  . MENTAL RETARDATION, MODERATE 03/11/2006  . GLAUCOMA NOS 03/11/2006  . HYPERTENSION 03/11/2006  . GERD 03/11/2006  . HIATAL HERNIA 03/11/2006  . PRURITUS ANI 03/11/2006  . OSTEOARTHRITIS, FOOT 03/11/2006  . Personal history of other musculoskeletal disorders(V13.59) 03/11/2006  . MENINGIOMA 12/20/2001   Past Medical History  Diagnosis Date  . Allergic rhinitis, cause unspecified   . Pain in joint, ankle and foot   . Backache, unspecified     chronic LBP with radiculopathy  . Seborrheic dermatitis, unspecified   . Type II or unspecified type diabetes mellitus without mention of complication, not stated as uncontrolled   . History of fracture of arm     Right  . History of fracture of  foot   . Dermatomycosis, unspecified   . Esophageal reflux   . Unspecified glaucoma(365.9)   . Undiagnosed cardiac murmurs   . Diaphragmatic hernia without mention of obstruction or gangrene   . Other and unspecified hyperlipidemia   . Unspecified essential hypertension   . Irritable bowel syndrome   . Benign neoplasm of cerebral meninges   . Moderate intellectual disabilities   . Nausea alone   . Osteoarthrosis, unspecified whether generalized or localized, ankle and foot   . Pruritus ani   . Unspecified tinnitus   . Nonspecific elevation of levels of transaminase or lactic acid dehydrogenase (LDH)   . Leukorrhea, not specified as infective   . Obesity   . Hyperglycemia   . DDD (degenerative disc disease), lumbar   . Yeast infection of the skin     Frequent   Past Surgical History  Procedure Laterality Date  . Total abdominal hysterectomy    . Cataract extraction    . Elbow surgery      right elbow   History  Substance Use Topics  . Smoking status: Never Smoker   . Smokeless tobacco: Never Used  . Alcohol Use: No   Family History  Problem Relation Age of Onset  . Diabetes Mother   . Diabetes Maternal Aunt   . Heart disease Maternal Aunt      (had pacemaker)  . Diabetes Maternal Grandmother   . Stroke Mother   .  Stroke Father   . Stroke Maternal Grandmother    Allergies  Allergen Reactions  . Ace Inhibitors     REACTION: COUGH  . Amoxicillin-Pot Clavulanate     REACTION: diarrhea  . Augmentin [Amoxicillin-Pot Clavulanate] Other (See Comments)  . Azithromycin Diarrhea  . Cyclobenzaprine Hcl     REACTION: diarrhea  . Flexeril [Cyclobenzaprine]   . Flonase [Fluticasone Propionate]     nosebleed  . Ibuprofen     REACTION: vomiting   Current Outpatient Prescriptions on File Prior to Visit  Medication Sig Dispense Refill  . aspirin EC 81 MG tablet Take 81 mg by mouth daily.      Marland Kitchen desonide (DESOWEN) 0.05 % ointment Apply 1 application topically 2 (two)  times daily.      Marland Kitchen diltiazem (TIAZAC) 240 MG 24 hr capsule Take 240 mg by mouth daily.      . hydrochlorothiazide (MICROZIDE) 12.5 MG capsule Take 1 capsule (12.5 mg total) by mouth daily.  90 capsule  1  . ketoconazole (NIZORAL) 2 % shampoo Apply 1 application topically 2 (two) times a week.  120 mL  3  . latanoprost (XALATAN) 0.005 % ophthalmic solution Place 1 drop into both eyes at bedtime.       . metFORMIN (GLUCOPHAGE) 500 MG tablet Take 500 mg by mouth 2 (two) times daily with a meal.      . nystatin (MYCOSTATIN/NYSTOP) 100000 UNIT/GM POWD Apply to affected areas with yeast three times daily as needed  2 Bottle  3  . omeprazole (PRILOSEC) 20 MG capsule Take 1 capsule (20 mg total) by mouth 2 (two) times daily.  180 capsule  1  . potassium chloride SA (K-DUR,KLOR-CON) 20 MEQ tablet Take 1 tablet (20 mEq total) by mouth daily.  90 tablet  3  . pravastatin (PRAVACHOL) 40 MG tablet Take 1 tablet (40 mg total) by mouth every evening.  90 tablet  3  . Propylene Glycol (SYSTANE BALANCE OP) Apply 2 drops to eye 2 (two) times daily as needed (dry eyes).       . sodium chloride (OCEAN) 0.65 % nasal spray Place 1 spray into the nose as needed for congestion.      . [DISCONTINUED] Calcium Carbonate-Vitamin D (CALCIUM-VITAMIN D) 500-200 MG-UNIT per tablet Take 1 tablet by mouth 2 (two) times daily with a meal.        . [DISCONTINUED] fexofenadine (ALLEGRA) 180 MG tablet Take 1 tablet (180 mg total) by mouth daily.  15 tablet  0   No current facility-administered medications on file prior to visit.     Review of Systems Review of Systems  Constitutional: Negative for fever, appetite change, fatigue and unexpected weight change.  ENT pos for some post nasal drip and occ congestion/ neg for sinus pain  Eyes: Negative for pain and visual disturbance.  Respiratory: Negative for wheeze  and shortness of breath.  pos for cough  Cardiovascular: Negative for cp or palpitations    Gastrointestinal:  Negative for  diarrhea and constipation.  Genitourinary: Negative for urgency and frequency.  Skin: Negative for pallor or rash   Neurological: Negative for weakness, light-headedness, numbness and headaches.  Hematological: Negative for adenopathy. Does not bruise/bleed easily.  Psychiatric/Behavioral: Negative for dysphoric mood. The patient is not nervous/anxious.         Objective:   Physical Exam  Constitutional: She appears well-developed and well-nourished. No distress.  obese and well appearing   HENT:  Head: Normocephalic and atraumatic.  Right Ear:  External ear normal.  Left Ear: External ear normal.  Mouth/Throat: Oropharynx is clear and moist.  Nares are boggy with scant clear rhinorrhea No sinus tenderness Clear throat   Eyes: Conjunctivae and EOM are normal. Pupils are equal, round, and reactive to light. Right eye exhibits no discharge. Left eye exhibits no discharge.  Neck: Normal range of motion. Neck supple.  Cardiovascular: Normal rate and regular rhythm.   Rate in high 80s during exam  Pulmonary/Chest: Effort normal and breath sounds normal. No respiratory distress. She has no wheezes. She has no rales.  No crackles or rhonchi  Lymphadenopathy:    She has no cervical adenopathy.  Neurological: She is alert.  Skin: Skin is warm and dry. No rash noted.  Psychiatric: She has a normal mood and affect.  Baseline MR - repeats herself frequently Less anxious today however          Assessment & Plan:

## 2012-12-20 NOTE — Patient Instructions (Signed)
I think you are getting over a head and chest cold  Continue drinking lots of water  If you cough up mucous- spit it out and do not swallow it (that can give you an upset stomach) If you run a fever or get worse please call and let me know Your ER notes were reassuring and your lungs sound clear today

## 2012-12-22 NOTE — ED Provider Notes (Signed)
Medical screening examination/treatment/procedure(s) were performed by non-physician practitioner and as supervising physician I was immediately available for consultation/collaboration.  EKG Interpretation    Date/Time:  Sunday December 19 2012 08:49:57 EST Ventricular Rate:  94 PR Interval:  155 QRS Duration: 103 QT Interval:  356 QTC Calculation: 445 R Axis:   6 Text Interpretation:  Sinus rhythm Baseline wander in lead(s) V2 No significant change since last tracing Confirmed by Anitra Lauth  MD, Dannel Rafter ([id number]) on 12/19/2012 8:58:18 AM              Gwyneth Sprout, MD 12/22/12 1420

## 2012-12-24 ENCOUNTER — Other Ambulatory Visit (INDEPENDENT_AMBULATORY_CARE_PROVIDER_SITE_OTHER): Payer: Self-pay | Admitting: *Deleted

## 2012-12-24 ENCOUNTER — Other Ambulatory Visit (INDEPENDENT_AMBULATORY_CARE_PROVIDER_SITE_OTHER): Payer: Self-pay | Admitting: General Surgery

## 2012-12-24 DIAGNOSIS — D1739 Benign lipomatous neoplasm of skin and subcutaneous tissue of other sites: Secondary | ICD-10-CM | POA: Diagnosis not present

## 2012-12-24 HISTORY — PX: LIPOMA EXCISION: SHX5283

## 2012-12-24 MED ORDER — OXYCODONE HCL 5 MG PO TABS: 5.0000 mg | ORAL_TABLET | ORAL | Status: DC | PRN

## 2012-12-31 ENCOUNTER — Telehealth: Payer: Self-pay

## 2012-12-31 NOTE — Telephone Encounter (Signed)
Pt has appt with foot doctor on 01/07/13 and pt has a Christmas party at her church on 01/09/13. Pt wants to go to both events and pt will take Loperamide on 12/19 and 12/21. Advised pt it should be OK for pt to take med on 12/19 and 12/21 since going to 2 events. Pt wants me to ck with Dr Milinda Antis also.Please advise.

## 2013-01-02 ENCOUNTER — Encounter (HOSPITAL_COMMUNITY): Payer: Self-pay | Admitting: Emergency Medicine

## 2013-01-02 ENCOUNTER — Emergency Department (INDEPENDENT_AMBULATORY_CARE_PROVIDER_SITE_OTHER)
Admission: EM | Admit: 2013-01-02 | Discharge: 2013-01-02 | Disposition: A | Discharge status: Home / Self Care | Payer: Medicare Other | Source: Home / Self Care | Attending: Emergency Medicine | Admitting: Emergency Medicine

## 2013-01-02 DIAGNOSIS — R109 Unspecified abdominal pain: Secondary | ICD-10-CM

## 2013-01-02 LAB — POCT URINALYSIS DIP (DEVICE)
Glucose, UA: NEGATIVE mg/dL
Hgb urine dipstick: NEGATIVE
Nitrite: NEGATIVE
Specific Gravity, Urine: 1.025 (ref 1.005–1.030)
Urobilinogen, UA: 0.2 mg/dL (ref 0.0–1.0)
pH: 6 (ref 5.0–8.0)

## 2013-01-02 NOTE — ED Notes (Signed)
Pt. states she stated having lower abdominal pain today.  Denies any nausea, or vomitting.  Last bowel movement this am.  Started after had bowel movement 1 week ago had surgery under left arm for a cyst.  Noted insision dry, no redness, no swelling. insision site intact, healing well.

## 2013-01-02 NOTE — Telephone Encounter (Signed)
That is fine, thanks 

## 2013-01-02 NOTE — ED Provider Notes (Signed)
Medical screening examination/treatment/procedure(s) were performed by non-physician practitioner and as supervising physician I was immediately available for consultation/collaboration.  Elijahjames Fuelling, M.D.  [place] C Jaylah Goodlow, MD 01/02/13 2046 

## 2013-01-02 NOTE — ED Provider Notes (Signed)
CSN: [id number]     Arrival date & time 01/02/13  1022 History   First MD Initiated Contact with Patient 01/02/13 1300     Chief Complaint  Patient presents with  . Abdominal Pain     Pt. states she stated having lower abdominal pain today.  Denies any nausea, or vomitting.     (Consider location/radiation/quality/duration/timing/severity/associated sxs/prior Treatment) HPI Comments: Pt reports got up this morning, ate breakfast, had a normal bowel movement, and laid down.  While resting, felt mild abdominal discomfort in lower abd for about 5 minutes. It resolved spontaneously. After questioning, pt thinks may have been gas.   Patient is a 57 y.o. female presenting with abdominal pain. The history is provided by the patient.  Abdominal Pain This is a new problem. Episode onset: this morning around 9:30am. The problem occurs rarely. The problem has been resolved. Associated symptoms include abdominal pain. Nothing aggravates the symptoms. Nothing relieves the symptoms. She has tried nothing for the symptoms.    Past Medical History  Diagnosis Date  . Allergic rhinitis, cause unspecified   . Pain in joint, ankle and foot   . Backache, unspecified     chronic LBP with radiculopathy  . Seborrheic dermatitis, unspecified   . Type II or unspecified type diabetes mellitus without mention of complication, not stated as uncontrolled   . History of fracture of arm     Right  . History of fracture of foot   . Dermatomycosis, unspecified   . Esophageal reflux   . Unspecified glaucoma(365.9)   . Undiagnosed cardiac murmurs   . Diaphragmatic hernia without mention of obstruction or gangrene   . Other and unspecified hyperlipidemia   . Unspecified essential hypertension   . Irritable bowel syndrome   . Benign neoplasm of cerebral meninges   . Moderate intellectual disabilities   . Nausea alone   . Osteoarthrosis, unspecified whether generalized or localized, ankle and foot   . Pruritus ani    . Unspecified tinnitus   . Nonspecific elevation of levels of transaminase or lactic acid dehydrogenase (LDH)   . Leukorrhea, not specified as infective   . Obesity   . Hyperglycemia   . DDD (degenerative disc disease), lumbar   . Yeast infection of the skin     Frequent   Past Surgical History  Procedure Laterality Date  . Total abdominal hysterectomy    . Cataract extraction    . Elbow surgery      right elbow   Family History  Problem Relation Age of Onset  . Diabetes Mother   . Diabetes Maternal Aunt   . Heart disease Maternal Aunt      (had pacemaker)  . Diabetes Maternal Grandmother   . Stroke Mother   . Stroke Father   . Stroke Maternal Grandmother    History  Substance Use Topics  . Smoking status: Never Smoker   . Smokeless tobacco: Never Used  . Alcohol Use: No   OB History   Grav Para Term Preterm Abortions TAB SAB Ect Mult Living                 Review of Systems  Constitutional: Negative for fever and chills.  Gastrointestinal: Positive for abdominal pain. Negative for nausea, vomiting, diarrhea and constipation.    Allergies  Ace inhibitors; Amoxicillin-pot clavulanate; Augmentin; Azithromycin; Cyclobenzaprine hcl; Flexeril; Flonase; and Ibuprofen  Home Medications   Current Outpatient Rx  Name  Route  Sig  Dispense  Refill  .  aspirin EC 81 MG tablet   Oral   Take 81 mg by mouth daily.         Marland Kitchen desonide (DESOWEN) 0.05 % ointment   Topical   Apply 1 application topically 2 (two) times daily.         Marland Kitchen diltiazem (TIAZAC) 240 MG 24 hr capsule   Oral   Take 240 mg by mouth daily.         . hydrochlorothiazide (MICROZIDE) 12.5 MG capsule   Oral   Take 1 capsule (12.5 mg total) by mouth daily.   90 capsule   1   . ketoconazole (NIZORAL) 2 % shampoo   Topical   Apply 1 application topically 2 (two) times a week.   120 mL   3   . latanoprost (XALATAN) 0.005 % ophthalmic solution   Both Eyes   Place 1 drop into both eyes at  bedtime.          . metFORMIN (GLUCOPHAGE) 500 MG tablet   Oral   Take 500 mg by mouth 2 (two) times daily with a meal.         . nystatin (MYCOSTATIN/NYSTOP) 100000 UNIT/GM POWD      Apply to affected areas with yeast three times daily as needed   2 Bottle   3   . omeprazole (PRILOSEC) 20 MG capsule   Oral   Take 1 capsule (20 mg total) by mouth 2 (two) times daily.   180 capsule   1   . potassium chloride SA (K-DUR,KLOR-CON) 20 MEQ tablet   Oral   Take 1 tablet (20 mEq total) by mouth daily.   90 tablet   3   . pravastatin (PRAVACHOL) 40 MG tablet   Oral   Take 1 tablet (40 mg total) by mouth every evening.   90 tablet   3   . Propylene Glycol (SYSTANE BALANCE OP)   Ophthalmic   Apply 2 drops to eye 2 (two) times daily as needed (dry eyes).          . sodium chloride (OCEAN) 0.65 % nasal spray   Nasal   Place 1 spray into the nose as needed for congestion.         Marland Kitchen oxyCODONE (OXY IR/ROXICODONE) 5 MG immediate release tablet   Oral   Take 1-2 tablets (5-10 mg total) by mouth every 4 (four) hours as needed for severe pain (Given at discharge from [facility]).   30 tablet   0    BP 102/73  Pulse 103  Temp(Src) 97.8 F (36.6 C) (Oral)  Resp 18  SpO2 96% Physical Exam  Constitutional: She appears well-developed and well-nourished. No distress.  Cardiovascular: Normal rate and regular rhythm.   Pulmonary/Chest: Effort normal and breath sounds normal.  Abdominal: Normal appearance and bowel sounds are normal. She exhibits no distension. There is no tenderness. There is no CVA tenderness.  Obese abd    ED Course  Procedures (including critical care time) Labs Review Labs Reviewed  POCT URINALYSIS DIP (DEVICE) - Abnormal; Notable for the following:    Leukocytes, UA MODERATE (*)    All other components within normal limits   Imaging Review No results found.  EKG Interpretation    Date/Time:    Ventricular Rate:    PR Interval:    QRS Duration:    QT Interval:    QTC Calculation:   R Axis:     Text Interpretation:  MDM   1. Abdominal discomfort   Uncertain of cause of 5 minutes of mild abd discomfort this morning. Exam benign. No concerning sx or findings. Pt to f/u with pcp if gets sx again.     [name], NP 01/02/13 [phone]

## 2013-01-03 NOTE — Telephone Encounter (Signed)
Linda Thompson advise pt of Dr. Royden Purl comments

## 2013-01-04 NOTE — Telephone Encounter (Signed)
Pt called back to get answer from Dr Milinda Antis; advised pt OK to take loperamide on 01/07/13 and 01/09/13. Pt voiced understanding.

## 2013-01-05 ENCOUNTER — Encounter (HOSPITAL_COMMUNITY): Payer: Self-pay | Admitting: Emergency Medicine

## 2013-01-05 ENCOUNTER — Emergency Department (INDEPENDENT_AMBULATORY_CARE_PROVIDER_SITE_OTHER)
Admission: EM | Admit: 2013-01-05 | Discharge: 2013-01-05 | Disposition: A | Discharge status: Home / Self Care | Payer: Medicare Other | Source: Home / Self Care | Attending: Family Medicine | Admitting: Family Medicine

## 2013-01-05 DIAGNOSIS — J3489 Other specified disorders of nose and nasal sinuses: Secondary | ICD-10-CM

## 2013-01-05 DIAGNOSIS — R0981 Nasal congestion: Secondary | ICD-10-CM

## 2013-01-05 NOTE — ED Notes (Signed)
C/o nasal drainage States her throat is sore Feels as if tonsils are swollen No medications taken

## 2013-01-05 NOTE — ED Provider Notes (Signed)
CSN: [id number]     Arrival date & time 01/05/13  1649 History   First MD Initiated Contact with Patient 01/05/13 1830     Chief Complaint  Patient presents with  . Nasal Congestion   (Consider location/radiation/quality/duration/timing/severity/associated sxs/prior Treatment) HPI Comments: Linda Thompson comes to the [facility] for evaluation of nasal congestion. She states he symptoms began over the last 24-36 hours and she wonders if she is "coming down with a cold." She asks if it would be appropriate to use a nasal spray previously recommended by her PCP (nasal saline) and if she should keep her appointment with her podiatrist tomorrow or if she should stay home. Denies fever, ST, cough, N/V/D.   The history is provided by the patient.    Past Medical History  Diagnosis Date  . Allergic rhinitis, cause unspecified   . Pain in joint, ankle and foot   . Backache, unspecified     chronic LBP with radiculopathy  . Seborrheic dermatitis, unspecified   . Type II or unspecified type diabetes mellitus without mention of complication, not stated as uncontrolled   . History of fracture of arm     Right  . History of fracture of foot   . Dermatomycosis, unspecified   . Esophageal reflux   . Unspecified glaucoma(365.9)   . Undiagnosed cardiac murmurs   . Diaphragmatic hernia without mention of obstruction or gangrene   . Other and unspecified hyperlipidemia   . Unspecified essential hypertension   . Irritable bowel syndrome   . Benign neoplasm of cerebral meninges   . Moderate intellectual disabilities   . Nausea alone   . Osteoarthrosis, unspecified whether generalized or localized, ankle and foot   . Pruritus ani   . Unspecified tinnitus   . Nonspecific elevation of levels of transaminase or lactic acid dehydrogenase (LDH)   . Leukorrhea, not specified as infective   . Obesity   . Hyperglycemia   . DDD (degenerative disc disease), lumbar   . Yeast infection of the skin     Frequent    Past Surgical History  Procedure Laterality Date  . Total abdominal hysterectomy    . Cataract extraction    . Elbow surgery      right elbow   Family History  Problem Relation Age of Onset  . Diabetes Mother   . Diabetes Maternal Aunt   . Heart disease Maternal Aunt      (had pacemaker)  . Diabetes Maternal Grandmother   . Stroke Mother   . Stroke Father   . Stroke Maternal Grandmother    History  Substance Use Topics  . Smoking status: Never Smoker   . Smokeless tobacco: Never Used  . Alcohol Use: No   OB History   Grav Para Term Preterm Abortions TAB SAB Ect Mult Living                 Review of Systems  All other systems reviewed and are negative.    Allergies  Ace inhibitors; Amoxicillin-pot clavulanate; Augmentin; Azithromycin; Cyclobenzaprine hcl; Flexeril; Flonase; and Ibuprofen  Home Medications   Current Outpatient Rx  Name  Route  Sig  Dispense  Refill  . aspirin EC 81 MG tablet   Oral   Take 81 mg by mouth daily.         Marland Kitchen desonide (DESOWEN) 0.05 % ointment   Topical   Apply 1 application topically 2 (two) times daily.         Marland Kitchen diltiazem [facility])  240 MG 24 hr capsule   Oral   Take 240 mg by mouth daily.         . hydrochlorothiazide (MICROZIDE) 12.5 MG capsule   Oral   Take 1 capsule (12.5 mg total) by mouth daily.   90 capsule   1   . ketoconazole (NIZORAL) 2 % shampoo   Topical   Apply 1 application topically 2 (two) times a week.   120 mL   3   . latanoprost (XALATAN) 0.005 % ophthalmic solution   Both Eyes   Place 1 drop into both eyes at bedtime.          . metFORMIN (GLUCOPHAGE) 500 MG tablet   Oral   Take 500 mg by mouth 2 (two) times daily with a meal.         . nystatin (MYCOSTATIN/NYSTOP) 100000 UNIT/GM POWD      Apply to affected areas with yeast three times daily as needed   2 Bottle   3   . omeprazole (PRILOSEC) 20 MG capsule   Oral   Take 1 capsule (20 mg total) by mouth 2 (two) times daily.    180 capsule   1   . oxyCODONE (OXY IR/ROXICODONE) 5 MG immediate release tablet   Oral   Take 1-2 tablets (5-10 mg total) by mouth every 4 (four) hours as needed for severe pain (Given at discharge from [facility]).   30 tablet   0   . potassium chloride SA (K-DUR,KLOR-CON) 20 MEQ tablet   Oral   Take 1 tablet (20 mEq total) by mouth daily.   90 tablet   3   . pravastatin (PRAVACHOL) 40 MG tablet   Oral   Take 1 tablet (40 mg total) by mouth every evening.   90 tablet   3   . Propylene Glycol (SYSTANE BALANCE OP)   Ophthalmic   Apply 2 drops to eye 2 (two) times daily as needed (dry eyes).          . sodium chloride (OCEAN) 0.65 % nasal spray   Nasal   Place 1 spray into the nose as needed for congestion.          BP 130/67  Pulse 96  Temp(Src) 97.9 F (36.6 C) (Oral)  Resp 18  SpO2 95% Physical Exam  Nursing note and vitals reviewed. Constitutional: She is oriented to person, place, and time. She appears well-developed and well-nourished. No distress.  +obese  HENT:  Head: Normocephalic and atraumatic.  Right Ear: Hearing, tympanic membrane, external ear and ear canal normal.  Left Ear: Hearing, tympanic membrane, external ear and ear canal normal.  Nose: Nose normal.  Mouth/Throat: Uvula is midline, oropharynx is clear and moist and mucous membranes are normal.  Eyes: Conjunctivae are normal. Right eye exhibits no discharge. Left eye exhibits no discharge. No scleral icterus.  Neck: Normal range of motion. Neck supple.  Cardiovascular: Normal rate, regular rhythm and normal heart sounds.   Pulmonary/Chest: Effort normal and breath sounds normal. No respiratory distress. She has no wheezes. She has no rales.  Abdominal: Soft. Bowel sounds are normal. She exhibits no distension. There is no tenderness.  Musculoskeletal: Normal range of motion.  Lymphadenopathy:    She has no cervical adenopathy.  Neurological: She is alert and oriented to person, place, and time.     ED Course  Procedures (including critical care time) Labs Review Labs Reviewed  CULTURE, GROUP A STREP  POCT RAPID STREP A ([facility]  ONLY)   Imaging Review No results found.  EKG Interpretation    Date/Time:    Ventricular Rate:    PR Interval:    QRS Duration:   QT Interval:    QTC Calculation:   R Axis:     Text Interpretation:              MDM   1. Nasal congestion    Linda Thompson is a delightful lady who's exam is unremarkable and would appear to need only some reassurance about medications that are safe to use at home. I advised her regarding the use of and safety of nasal saline and told her that as long as she feels otherwise well and does not have fever tomorrow, she may keep her appointment with podiatry.    Jess Barters [place], [place] 01/06/13 [phone]

## 2013-01-06 NOTE — ED Provider Notes (Signed)
Medical screening examination/treatment/procedure(s) were performed by resident physician or non-physician practitioner and as supervising physician I was immediately available for consultation/collaboration.   Ryver Poblete DOUGLAS MD.   Avary Eichenberger D Caleigha Zale, MD 01/06/13 1745 

## 2013-01-07 ENCOUNTER — Ambulatory Visit (INDEPENDENT_AMBULATORY_CARE_PROVIDER_SITE_OTHER): Payer: Medicare Other

## 2013-01-07 VITALS — BP 119/76 | HR 97 | Resp 20 | Ht 62.0 in | Wt 200.0 lb

## 2013-01-07 DIAGNOSIS — E1140 Type 2 diabetes mellitus with diabetic neuropathy, unspecified: Secondary | ICD-10-CM

## 2013-01-07 DIAGNOSIS — E1149 Type 2 diabetes mellitus with other diabetic neurological complication: Secondary | ICD-10-CM

## 2013-01-07 DIAGNOSIS — E1142 Type 2 diabetes mellitus with diabetic polyneuropathy: Secondary | ICD-10-CM | POA: Diagnosis not present

## 2013-01-07 DIAGNOSIS — Q828 Other specified congenital malformations of skin: Secondary | ICD-10-CM

## 2013-01-07 DIAGNOSIS — L608 Other nail disorders: Secondary | ICD-10-CM

## 2013-01-07 NOTE — Progress Notes (Signed)
   Subjective:    Patient ID: Linda Thompson, female    DOB: 1955/05/20, 57 y.o.   MRN: [id number] "Have my toenails trimmed."  HPI    Review of Systems no new finding     Objective:   Physical Exam Ask your status is intact although diminished DP plus one over 4 PT plus one over 4 bilateral. Capillary refill timed 3-4 seconds skin temperature warm turgor normal no edema rubor pallor noted mild varicosities noted. Neurologically epicritic and proprioceptive sensations diminished on Semmes Weinstein testing bilateral to the digits and plantar forefoot. Orthopedic exam unremarkable rectus foot type with semirigid digital contractures noted although asymptomatic. Nails thick brittle and slightly criptotic incurvated 1 through 5 bilateral. Patient been applying topical antifungal advised to continue do so once or twice a month. No open wounds ulcerations no secondary infections noted current time.       Assessment & Plan:  Assessment diabetes with peripheral neuropathy. Dystrophic nails debrided x10 the presence of diabetes and complications return for future palliative care and as-needed basis suggest a 3 month followup  Alvan Dame DPM

## 2013-01-07 NOTE — Patient Instructions (Signed)
Diabetes and Foot Care Diabetes may cause you to have problems because of poor blood supply (circulation) to your feet and legs. This may cause the skin on your feet to become thinner, break easier, and heal more slowly. Your skin may become dry, and the skin may peel and crack. You may also have nerve damage in your legs and feet causing decreased feeling in them. You may not notice minor injuries to your feet that could lead to infections or more serious problems. Taking care of your feet is one of the most important things you can do for yourself.  HOME CARE INSTRUCTIONS  Wear shoes at all times, even in the house. Do not go barefoot. Bare feet are easily injured.  Check your feet daily for blisters, cuts, and redness. If you cannot see the bottom of your feet, use a mirror or ask someone for help.  Wash your feet with warm water (do not use hot water) and mild soap. Then pat your feet and the areas between your toes until they are completely dry. Do not soak your feet as this can dry your skin.  Apply a moisturizing lotion or petroleum jelly (that does not contain alcohol and is unscented) to the skin on your feet and to dry, brittle toenails. Do not apply lotion between your toes.  Trim your toenails straight across. Do not dig under them or around the cuticle. File the edges of your nails with an emery board or nail file.  Do not cut corns or calluses or try to remove them with medicine.  Wear clean socks or stockings every day. Make sure they are not too tight. Do not wear knee-high stockings since they may decrease blood flow to your legs.  Wear shoes that fit properly and have enough cushioning. To break in new shoes, wear them for just a few hours a day. This prevents you from injuring your feet. Always look in your shoes before you put them on to be sure there are no objects inside.  Do not cross your legs. This may decrease the blood flow to your feet.  If you find a minor scrape,  cut, or break in the skin on your feet, keep it and the skin around it clean and dry. These areas may be cleansed with mild soap and water. Do not cleanse the area with peroxide, alcohol, or iodine.  When you remove an adhesive bandage, be sure not to damage the skin around it.  If you have a wound, look at it several times a day to make sure it is healing.  Do not use heating pads or hot water bottles. They may burn your skin. If you have lost feeling in your feet or legs, you may not know it is happening until it is too late.  Make sure your health care provider performs a complete foot exam at least annually or more often if you have foot problems. Report any cuts, sores, or bruises to your health care provider immediately. SEEK MEDICAL CARE IF:   You have an injury that is not healing.  You have cuts or breaks in the skin.  You have an ingrown nail.  You notice redness on your legs or feet.  You feel burning or tingling in your legs or feet.  You have pain or cramps in your legs and feet.  Your legs or feet are numb.  Your feet always feel cold. SEEK IMMEDIATE MEDICAL CARE IF:   There is increasing redness,   swelling, or pain in or around a wound.  There is a red line that goes up your leg.  Pus is coming from a wound.  You develop a fever or as directed by your health care provider.  You notice a bad smell coming from an ulcer or wound. Document Released: 01/04/2000 Document Revised: 09/08/2012 Document Reviewed: 06/15/2012 [employer] Patient Information 2014 [employer].  

## 2013-01-08 ENCOUNTER — Emergency Department (INDEPENDENT_AMBULATORY_CARE_PROVIDER_SITE_OTHER)
Admission: EM | Admit: 2013-01-08 | Discharge: 2013-01-08 | Disposition: A | Discharge status: Home / Self Care | Payer: Medicare Other | Source: Home / Self Care | Attending: Family Medicine | Admitting: Family Medicine

## 2013-01-08 ENCOUNTER — Encounter (HOSPITAL_COMMUNITY): Payer: Self-pay | Admitting: Emergency Medicine

## 2013-01-08 DIAGNOSIS — J069 Acute upper respiratory infection, unspecified: Secondary | ICD-10-CM

## 2013-01-08 LAB — CULTURE, GROUP A STREP

## 2013-01-08 MED ORDER — IPRATROPIUM BROMIDE 0.06 % NA SOLN: 2.0000 | Freq: Four times a day (QID) | NASAL | Status: DC

## 2013-01-08 NOTE — ED Provider Notes (Signed)
Linda Thompson is a 57 y.o. female who presents to Urgent Care today for nasal congestion and shortness of breath. Patient has had about one week of runny nose. She has been using nasal saline which has helped a bit. She's here today because she's not feeling better. She was seen in the 17th at that time was recommended to start nasal saline. She's concerned about using prescription nasal sprays because she developed nosebleed one time in the past. She's not sure the name of this medication. She notes a mild nonproductive cough. She denies any fevers or chills nausea vomiting diarrhea or trouble breathing. She feels well otherwise.   Past Medical History  Diagnosis Date  . Allergic rhinitis, cause unspecified   . Pain in joint, ankle and foot   . Backache, unspecified     chronic LBP with radiculopathy  . Seborrheic dermatitis, unspecified   . Type II or unspecified type diabetes mellitus without mention of complication, not stated as uncontrolled   . History of fracture of arm     Right  . History of fracture of foot   . Dermatomycosis, unspecified   . Esophageal reflux   . Unspecified glaucoma(365.9)   . Undiagnosed cardiac murmurs   . Diaphragmatic hernia without mention of obstruction or gangrene   . Other and unspecified hyperlipidemia   . Unspecified essential hypertension   . Irritable bowel syndrome   . Benign neoplasm of cerebral meninges   . Moderate intellectual disabilities   . Nausea alone   . Osteoarthrosis, unspecified whether generalized or localized, ankle and foot   . Pruritus ani   . Unspecified tinnitus   . Nonspecific elevation of levels of transaminase or lactic acid dehydrogenase (LDH)   . Leukorrhea, not specified as infective   . Obesity   . Hyperglycemia   . DDD (degenerative disc disease), lumbar   . Yeast infection of the skin     Frequent   History  Substance Use Topics  . Smoking status: Never Smoker   . Smokeless tobacco: Never Used  . Alcohol  Use: No   ROS as above Medications reviewed. No current facility-administered medications for this encounter.   Current Outpatient Prescriptions  Medication Sig Dispense Refill  . aspirin EC 81 MG tablet Take 81 mg by mouth daily.      Marland Kitchen desonide (DESOWEN) 0.05 % ointment Apply 1 application topically 2 (two) times daily.      Marland Kitchen diltiazem (TIAZAC) 240 MG 24 hr capsule Take 240 mg by mouth daily.      . hydrochlorothiazide (MICROZIDE) 12.5 MG capsule Take 1 capsule (12.5 mg total) by mouth daily.  90 capsule  1  . ipratropium (ATROVENT) 0.06 % nasal spray Place 2 sprays into both nostrils 4 (four) times daily.  15 mL  1  . ketoconazole (NIZORAL) 2 % shampoo Apply 1 application topically 2 (two) times a week.  120 mL  3  . latanoprost (XALATAN) 0.005 % ophthalmic solution Place 1 drop into both eyes at bedtime.       . metFORMIN (GLUCOPHAGE) 500 MG tablet Take 500 mg by mouth 2 (two) times daily with a meal.      . nystatin (MYCOSTATIN/NYSTOP) 100000 UNIT/GM POWD Apply to affected areas with yeast three times daily as needed  2 Bottle  3  . omeprazole (PRILOSEC) 20 MG capsule Take 1 capsule (20 mg total) by mouth 2 (two) times daily.  180 capsule  1  . oxyCODONE (OXY IR/ROXICODONE) 5 MG  immediate release tablet Take 1-2 tablets (5-10 mg total) by mouth every 4 (four) hours as needed for severe pain (Given at discharge from [facility]).  30 tablet  0  . potassium chloride SA (K-DUR,KLOR-CON) 20 MEQ tablet Take 1 tablet (20 mEq total) by mouth daily.  90 tablet  3  . pravastatin (PRAVACHOL) 40 MG tablet Take 1 tablet (40 mg total) by mouth every evening.  90 tablet  3  . Propylene Glycol (SYSTANE BALANCE OP) Apply 2 drops to eye 2 (two) times daily as needed (dry eyes).       . sodium chloride (OCEAN) 0.65 % nasal spray Place 1 spray into the nose as needed for congestion.      . [DISCONTINUED] Calcium Carbonate-Vitamin D (CALCIUM-VITAMIN D) 500-200 MG-UNIT per tablet Take 1 tablet by mouth 2 (two) times  daily with a meal.        . [DISCONTINUED] fexofenadine (ALLEGRA) 180 MG tablet Take 1 tablet (180 mg total) by mouth daily.  15 tablet  0    Exam:  BP 104/72  Pulse 107  Temp(Src) 98.5 F (36.9 C) (Oral)  Resp 20  SpO2 95% Gen: Well NAD HEENT: EOMI,  MMM mild nasal congestion and inflammation of nasal turbinates. Posterior pharynx with cobblestoning. Tympanic membranes are normal appearing bilaterally Lungs: Normal work of breathing. CTABL Heart: RRR no MRG Exts:  warm and well perfused.   Assessment and Plan: 57 y.o. female with viral URI with rhinorrhea and cough. Patient has a component of postnasal drip. Will treat with Atrovent nasal spray as well as nasal saline. Followup with primary care provider. Discussed warning signs or symptoms. Please see discharge instructions. Patient expresses understanding.      Rodolph Bong, MD 01/08/13 [phone]

## 2013-01-08 NOTE — ED Notes (Signed)
Pt c/o persistent cold sxs onset 1 week... Reports she was seen here on 12/17 for similar sxs Sxs include: PND, cough, runny nose Denies: f/v/n/d, SOB, wheezing She is alert w/no signs of acute distress.

## 2013-01-10 ENCOUNTER — Ambulatory Visit (INDEPENDENT_AMBULATORY_CARE_PROVIDER_SITE_OTHER): Payer: Medicare Other | Admitting: Family Medicine

## 2013-01-10 ENCOUNTER — Encounter: Payer: Self-pay | Admitting: Family Medicine

## 2013-01-10 VITALS — BP 112/78 | HR 95 | Temp 97.6°F | Ht 62.0 in | Wt 199.2 lb

## 2013-01-10 DIAGNOSIS — K589 Irritable bowel syndrome without diarrhea: Secondary | ICD-10-CM

## 2013-01-10 DIAGNOSIS — E119 Type 2 diabetes mellitus without complications: Secondary | ICD-10-CM

## 2013-01-10 DIAGNOSIS — J309 Allergic rhinitis, unspecified: Secondary | ICD-10-CM | POA: Diagnosis not present

## 2013-01-10 NOTE — Assessment & Plan Note (Signed)
atrovent nasal spray prn Symptoms are improved Rev UC records

## 2013-01-10 NOTE — Progress Notes (Signed)
Subjective:    Patient ID: Linda Thompson, female    DOB: 21-Nov-1955, 57 y.o.   MRN: [id number]  HPI Here today with stomach issues  Possible exposure to a person with a GI bug (a woman who was helping her clean her house) 2 d of symptoms  Funny feeling stomach- gurgling sometimes and some loose stools (mild ) Took one dose of immodium on Friday and last night when she went to a church function  No pain  No blood in stool  No n/v   Had surgery on her lipoma - did very well with that    Has been in process with preparing to move-which is stressful   Of note also went to UC on Friday - for post nasal drip - they put her on atrovent nasal spray  In past - had nosebleeds with steroid spray so they suggested this in past  Feeling better   Patient Active Problem List   Diagnosis Date Noted  . Viral URI with cough 12/20/2012  . Sinus headache 12/15/2012  . Stress reaction 12/07/2012  . Low back pain 11/22/2012  . Lipoma of axilla 11/10/2012  . Labial swelling 11/03/2012  . Vaginal irritation 10/14/2012  . Diarrhea 06/23/2012  . Seborrheic keratosis 04/23/2012  . Chronic dermatitis of hands 09/12/2011  . Obesity 05/05/2011  . Candidal intertrigo 11/15/2010  . Irritable bowel syndrome 03/05/2010  . HEART MURMUR 10/12/2009  . TRANSAMINASES, SERUM, ELEVATED 11/22/2007  . DIABETES MELLITUS, TYPE II 08/13/2007  . ALLERGIC  RHINITIS 09/23/2006  . FUNGAL DERMATITIS 08/26/2006  . DERMATITIS, SEBORRHEIC NOS 08/26/2006  . HYPERLIPIDEMIA 03/11/2006  . MENTAL RETARDATION, MODERATE 03/11/2006  . GLAUCOMA NOS 03/11/2006  . HYPERTENSION 03/11/2006  . GERD 03/11/2006  . HIATAL HERNIA 03/11/2006  . PRURITUS ANI 03/11/2006  . OSTEOARTHRITIS, FOOT 03/11/2006  . Personal history of other musculoskeletal disorders(V13.59) 03/11/2006  . MENINGIOMA 12/20/2001   Past Medical History  Diagnosis Date  . Allergic rhinitis, cause unspecified   . Pain in joint, ankle and foot   . Backache,  unspecified     chronic LBP with radiculopathy  . Seborrheic dermatitis, unspecified   . Type II or unspecified type diabetes mellitus without mention of complication, not stated as uncontrolled   . History of fracture of arm     Right  . History of fracture of foot   . Dermatomycosis, unspecified   . Esophageal reflux   . Unspecified glaucoma(365.9)   . Undiagnosed cardiac murmurs   . Diaphragmatic hernia without mention of obstruction or gangrene   . Other and unspecified hyperlipidemia   . Unspecified essential hypertension   . Irritable bowel syndrome   . Benign neoplasm of cerebral meninges   . Moderate intellectual disabilities   . Nausea alone   . Osteoarthrosis, unspecified whether generalized or localized, ankle and foot   . Pruritus ani   . Unspecified tinnitus   . Nonspecific elevation of levels of transaminase or lactic acid dehydrogenase (LDH)   . Leukorrhea, not specified as infective   . Obesity   . Hyperglycemia   . DDD (degenerative disc disease), lumbar   . Yeast infection of the skin     Frequent   Past Surgical History  Procedure Laterality Date  . Total abdominal hysterectomy    . Cataract extraction    . Elbow surgery      right elbow   History  Substance Use Topics  . Smoking status: Never Smoker   . Smokeless tobacco:  Never Used  . Alcohol Use: No   Family History  Problem Relation Age of Onset  . Diabetes Mother   . Diabetes Maternal Aunt   . Heart disease Maternal Aunt      (had pacemaker)  . Diabetes Maternal Grandmother   . Stroke Mother   . Stroke Father   . Stroke Maternal Grandmother    Allergies  Allergen Reactions  . Ace Inhibitors     REACTION: COUGH  . Amoxicillin-Pot Clavulanate     REACTION: diarrhea  . Augmentin [Amoxicillin-Pot Clavulanate] Other (See Comments)  . Azithromycin Diarrhea  . Cyclobenzaprine Hcl     REACTION: diarrhea  . Flexeril [Cyclobenzaprine]   . Flonase [Fluticasone Propionate]     nosebleed  .  Ibuprofen     REACTION: vomiting   Current Outpatient Prescriptions on File Prior to Visit  Medication Sig Dispense Refill  . aspirin EC 81 MG tablet Take 81 mg by mouth daily.      Marland Kitchen desonide (DESOWEN) 0.05 % ointment Apply 1 application topically 2 (two) times daily.      Marland Kitchen diltiazem (TIAZAC) 240 MG 24 hr capsule Take 240 mg by mouth daily.      . hydrochlorothiazide (MICROZIDE) 12.5 MG capsule Take 1 capsule (12.5 mg total) by mouth daily.  90 capsule  1  . ipratropium (ATROVENT) 0.06 % nasal spray Place 2 sprays into both nostrils 4 (four) times daily.  15 mL  1  . ketoconazole (NIZORAL) 2 % shampoo Apply 1 application topically 2 (two) times a week.  120 mL  3  . latanoprost (XALATAN) 0.005 % ophthalmic solution Place 1 drop into both eyes at bedtime.       . metFORMIN (GLUCOPHAGE) 500 MG tablet Take 500 mg by mouth 2 (two) times daily with a meal.      . nystatin (MYCOSTATIN/NYSTOP) 100000 UNIT/GM POWD Apply to affected areas with yeast three times daily as needed  2 Bottle  3  . omeprazole (PRILOSEC) 20 MG capsule Take 1 capsule (20 mg total) by mouth 2 (two) times daily.  180 capsule  1  . oxyCODONE (OXY IR/ROXICODONE) 5 MG immediate release tablet Take 1-2 tablets (5-10 mg total) by mouth every 4 (four) hours as needed for severe pain (Given at discharge from [facility]).  30 tablet  0  . potassium chloride SA (K-DUR,KLOR-CON) 20 MEQ tablet Take 1 tablet (20 mEq total) by mouth daily.  90 tablet  3  . pravastatin (PRAVACHOL) 40 MG tablet Take 1 tablet (40 mg total) by mouth every evening.  90 tablet  3  . Propylene Glycol (SYSTANE BALANCE OP) Apply 2 drops to eye 2 (two) times daily as needed (dry eyes).       . sodium chloride (OCEAN) 0.65 % nasal spray Place 1 spray into the nose as needed for congestion.      . [DISCONTINUED] Calcium Carbonate-Vitamin D (CALCIUM-VITAMIN D) 500-200 MG-UNIT per tablet Take 1 tablet by mouth 2 (two) times daily with a meal.        . [DISCONTINUED] fexofenadine  (ALLEGRA) 180 MG tablet Take 1 tablet (180 mg total) by mouth daily.  15 tablet  0   No current facility-administered medications on file prior to visit.     Review of Systems    Review of Systems  Constitutional: Negative for fever, appetite change,and unexpected weight change.  ENT pos for post nasal drip and rhinorrhea , neg for sinus pain  Eyes: Negative for pain and visual  disturbance.  Respiratory: Negative for cough and shortness of breath.   Cardiovascular: Negative for cp or palpitations    Gastrointestinal: Negative for nausea, vomiting, abd pain  and constipation. pos for loose stools  Genitourinary: Negative for urgency and frequency.  Skin: Negative for pallor or rash   Neurological: Negative for weakness, light-headedness, numbness and headaches.  Hematological: Negative for adenopathy. Does not bruise/bleed easily.  Psychiatric/Behavioral: Negative for dysphoric mood. The patient is not nervous/anxious.  pos for stressors surrounding upcoming move     Objective:   Physical Exam  Constitutional: She appears well-developed and well-nourished. No distress.  obese and well appearing   HENT:  Head: Normocephalic and atraumatic.  Right Ear: External ear normal.  Left Ear: External ear normal.  Mouth/Throat: Oropharynx is clear and moist.  Nares boggy Mild clear rhinorrhea No sinus tenderness  Eyes: Conjunctivae and EOM are normal. Pupils are equal, round, and reactive to light. Right eye exhibits no discharge. Left eye exhibits no discharge.  Neck: Normal range of motion. Neck supple. No JVD present.  Cardiovascular: Normal rate and regular rhythm.   Pulmonary/Chest: Effort normal and breath sounds normal. No respiratory distress. She has no wheezes. She has no rales.  Abdominal: Soft. Bowel sounds are normal. She exhibits no distension and no mass. There is no tenderness. There is no rebound and no guarding.  Lymphadenopathy:    She has no cervical adenopathy.    Neurological: She is alert.  Skin: Skin is warm and dry. No rash noted.  Incision on L side is healing well from recent lipoma surgery  Psychiatric: She has a normal mood and affect.  Baseline MR          Assessment & Plan:

## 2013-01-10 NOTE — Progress Notes (Signed)
Pre-visit discussion using our clinic review tool. No additional management support is needed unless otherwise documented below in the visit note.  

## 2013-01-10 NOTE — Assessment & Plan Note (Signed)
Ongoing intermittent diarrhea and constipation-worse loose stool lately  Debate whether this may have to do with her metformin  inst to cut in 1/2 and report as to whether this helps her symptoms  In meantime disc imp of diet

## 2013-01-10 NOTE — Patient Instructions (Signed)
For your problem with loose stools - cut your metformin to 1/2 pill twice daily  (we will see if this helps or not) Use the atrovent nasal spray when you need it  If you develop fever or abdominal pain please let me know  Take care of yourself  Try to stick with a low sugar diabetic diet  Follow up with me in 3 months with labs prior (for diabetes)

## 2013-01-10 NOTE — Assessment & Plan Note (Signed)
Will cut metformin in 1/2 due to persistent loose stool  Stressed imp of better diet and need for wt loss  F/u 3 mo after A1C

## 2013-01-11 ENCOUNTER — Encounter: Payer: Self-pay | Admitting: Internal Medicine

## 2013-01-11 ENCOUNTER — Ambulatory Visit (INDEPENDENT_AMBULATORY_CARE_PROVIDER_SITE_OTHER): Payer: Medicare Other | Admitting: Internal Medicine

## 2013-01-11 ENCOUNTER — Telehealth: Payer: Self-pay

## 2013-01-11 VITALS — BP 128/70 | HR 91 | Temp 97.6°F | Ht 62.0 in | Wt 199.5 lb

## 2013-01-11 DIAGNOSIS — L290 Pruritus ani: Secondary | ICD-10-CM

## 2013-01-11 NOTE — Progress Notes (Signed)
Pre-visit discussion using our clinic review tool. No additional management support is needed unless otherwise documented below in the visit note.  

## 2013-01-11 NOTE — Patient Instructions (Signed)
Anal Pruritus Anal pruritus is an itching of the anus, which is often due to increased moisture of the skin around the anus. Moisture may be due to sweating or a small amount of remaining stool. The itching and scratching can cause further skin damage.  CAUSES   Poor hygiene.  Excessive moisture from sweating or residual stool in the anal area.  Perfumed soaps and sprays and colored toilet paper.  Chemicals in the foods you eat.  Dietary factors such as caffeine, beer, milk products, chocolate, nuts, citrus fruits, tomatoes, spicy seasonings, jalapeno peppers, and salsa.  Hemorrhoids, infections, and other anal diseases.  Excessive washing.  Overuse of laxatives.  Skin disorders (psoriasis, eczema, or seborrhea). HOME CARE INSTRUCTIONS   Practice good hygiene.  Clean the anal area gently with wet toilet paper, baby wipes, or a wet washcloth after every bowel movement and at bedtime. Avoid using soaps on the anal area. Dry the area thoroughly. Pat the area dry with toilet paper or a towel.  Do not scrub the anal area with anything, even toilet paper.  Try not to scratch the itchy area. Scratching produces more damage, which makes the itching worse.  Take sitz baths in warm water for 15 to 20 minutes, 2 to 3 times a day. Pat the area dry with a soft cloth after each bath.  Zinc oxide ointment or a moisture barrier cream can be applied several times daily to protect the skin.  Only take medicines as directed by your caregiver.  Talk to your caregiver about fiber supplements. These are helpful in normalizing the stool if you have frequent loose stools.  Wear cotton underwear and loose clothing.  Do not use irritants such as bubble baths, scented toilet paper, or genital deodorants. SEEK MEDICAL CARE IF:   Itching does not improve in several days or gets worse.  You have a fever.  There are problems with increased pain, swelling, or redness. MAKE SURE YOU:   Understand  these instructions.  Will watch your condition.  Will get help right away if you are not doing well or get worse. Document Released: 07/08/2010 Document Revised: 03/31/2011 Document Reviewed: 07/08/2010 [employer] Patient Information 2014 [employer].  

## 2013-01-11 NOTE — Telephone Encounter (Signed)
Agreed- she can take it as needed

## 2013-01-11 NOTE — Telephone Encounter (Signed)
Pt going to friends house for dinner on 01/13/13 and pt wants to know if can take loperamide before going to her friends house. Advised pt per Dr Milinda Antis previous instruction pt should not take loperamide regularly but occasionally is OK if needed. Pt voiced understanding.

## 2013-01-11 NOTE — Progress Notes (Signed)
Subjective:    Patient ID: Linda Thompson, female    DOB: 1955-02-07, 57 y.o.   MRN: [id number]  HPI  Pt presents to the clinic today with c/o anal itching. This started last night.She has had this before. She reports that she sometimes wipes with toilet paper and this make her itch. She also is concerned that she may have a yeast infection around her anus. She has ketoconazole cream to put on it if it is a yeast infection.  Review of Systems      Past Medical History  Diagnosis Date  . Allergic rhinitis, cause unspecified   . Pain in joint, ankle and foot   . Backache, unspecified     chronic LBP with radiculopathy  . Seborrheic dermatitis, unspecified   . Type II or unspecified type diabetes mellitus without mention of complication, not stated as uncontrolled   . History of fracture of arm     Right  . History of fracture of foot   . Dermatomycosis, unspecified   . Esophageal reflux   . Unspecified glaucoma(365.9)   . Undiagnosed cardiac murmurs   . Diaphragmatic hernia without mention of obstruction or gangrene   . Other and unspecified hyperlipidemia   . Unspecified essential hypertension   . Irritable bowel syndrome   . Benign neoplasm of cerebral meninges   . Moderate intellectual disabilities   . Nausea alone   . Osteoarthrosis, unspecified whether generalized or localized, ankle and foot   . Pruritus ani   . Unspecified tinnitus   . Nonspecific elevation of levels of transaminase or lactic acid dehydrogenase (LDH)   . Leukorrhea, not specified as infective   . Obesity   . Hyperglycemia   . DDD (degenerative disc disease), lumbar   . Yeast infection of the skin     Frequent    Current Outpatient Prescriptions  Medication Sig Dispense Refill  . aspirin EC 81 MG tablet Take 81 mg by mouth daily.      Marland Kitchen desonide (DESOWEN) 0.05 % ointment Apply 1 application topically 2 (two) times daily.      Marland Kitchen diltiazem (TIAZAC) 240 MG 24 hr capsule Take 240 mg by mouth  daily.      . hydrochlorothiazide (MICROZIDE) 12.5 MG capsule Take 1 capsule (12.5 mg total) by mouth daily.  90 capsule  1  . ipratropium (ATROVENT) 0.06 % nasal spray Place 2 sprays into both nostrils 4 (four) times daily.  15 mL  1  . ketoconazole (NIZORAL) 2 % shampoo Apply 1 application topically 2 (two) times a week.  120 mL  3  . latanoprost (XALATAN) 0.005 % ophthalmic solution Place 1 drop into both eyes at bedtime.       . metFORMIN (GLUCOPHAGE) 500 MG tablet Take 250 mg by mouth 2 (two) times daily with a meal.       . nystatin (MYCOSTATIN/NYSTOP) 100000 UNIT/GM POWD Apply to affected areas with yeast three times daily as needed  2 Bottle  3  . omeprazole (PRILOSEC) 20 MG capsule Take 1 capsule (20 mg total) by mouth 2 (two) times daily.  180 capsule  1  . oxyCODONE (OXY IR/ROXICODONE) 5 MG immediate release tablet Take 1-2 tablets (5-10 mg total) by mouth every 4 (four) hours as needed for severe pain (Given at discharge from [facility]).  30 tablet  0  . potassium chloride SA (K-DUR,KLOR-CON) 20 MEQ tablet Take 1 tablet (20 mEq total) by mouth daily.  90 tablet  3  .  pravastatin (PRAVACHOL) 40 MG tablet Take 1 tablet (40 mg total) by mouth every evening.  90 tablet  3  . Propylene Glycol (SYSTANE BALANCE OP) Apply 2 drops to eye 2 (two) times daily as needed (dry eyes).       . sodium chloride (OCEAN) 0.65 % nasal spray Place 1 spray into the nose as needed for congestion.      . [DISCONTINUED] Calcium Carbonate-Vitamin D (CALCIUM-VITAMIN D) 500-200 MG-UNIT per tablet Take 1 tablet by mouth 2 (two) times daily with a meal.        . [DISCONTINUED] fexofenadine (ALLEGRA) 180 MG tablet Take 1 tablet (180 mg total) by mouth daily.  15 tablet  0   No current facility-administered medications for this visit.    Allergies  Allergen Reactions  . Ace Inhibitors     REACTION: COUGH  . Amoxicillin-Pot Clavulanate     REACTION: diarrhea  . Augmentin [Amoxicillin-Pot Clavulanate] Other (See  Comments)  . Azithromycin Diarrhea  . Cyclobenzaprine Hcl     REACTION: diarrhea  . Flexeril [Cyclobenzaprine]   . Flonase [Fluticasone Propionate]     nosebleed  . Ibuprofen     REACTION: vomiting    Family History  Problem Relation Age of Onset  . Diabetes Mother   . Diabetes Maternal Aunt   . Heart disease Maternal Aunt      (had pacemaker)  . Diabetes Maternal Grandmother   . Stroke Mother   . Stroke Father   . Stroke Maternal Grandmother     History   Social History  . Marital Status: Single    Spouse Name: N/A    Number of Children: 0  . Years of Education: N/A   Occupational History  . Disabled (from arm fracture)    Social History Main Topics  . Smoking status: Never Smoker   . Smokeless tobacco: Never Used  . Alcohol Use: No  . Drug Use: No  . Sexual Activity: Not Currently   Other Topics Concern  . Not on file   Social History Narrative   Lives alone-with help from neighbors      Currently-disability for arm fracture      [name] (neighbor) is POA     Constitutional: Denies fever, malaise, fatigue, headache or abrupt weight changes.  Gastrointestinal: Denies abdominal pain, bloating, constipation, diarrhea or blood in the stool.    No other specific complaints in a complete review of systems (except as listed in HPI above).  Objective:   Physical Exam   BP 128/70  Pulse 91  Temp(Src) 97.6 F (36.4 C) (Oral)  Ht 5\' 2"  (1.575 m)  Wt 199 lb 8 oz (90.493 kg)  BMI 36.48 kg/m2  SpO2 96% Wt Readings from Last 3 Encounters:  01/11/13 199 lb 8 oz (90.493 kg)  01/10/13 199 lb 4 oz (90.379 kg)  01/07/13 200 lb (90.719 kg)    General: Appears her stated age,obese but well developed, well nourished in NAD. Cardiovascular: Normal rate and rhythm. S1,S2 noted.  No murmur, rubs or gallops noted. No JVD or BLE edema. No carotid bruits noted. Pulmonary/Chest: Normal effort and positive vesicular breath sounds. No respiratory distress. No  wheezes, rales or ronchi noted.  Abdomen: Soft and nontender. Normal bowel sounds, no bruits noted. No distention or masses noted. Liver, spleen and kidneys non palpable. No external hemorrhoids. No stool noted on the skin. No evidence of yeast noted around the anal opening.   BMET    Component Value Date/Time  NA 141 06/13/2012 1822   K 4.3 06/13/2012 1822   CL 105 06/13/2012 1822   CO2 25 06/13/2012 1822   GLUCOSE 134* 06/13/2012 1822   BUN 22 06/13/2012 1822   CREATININE 0.81 06/13/2012 1822   CALCIUM 9.1 06/13/2012 1822   GFRNONAA 79* 06/13/2012 1822   GFRAA >90 06/13/2012 1822    Lipid Panel     Component Value Date/Time   CHOL 155 06/04/2012 0956   TRIG 256.0* 06/04/2012 0956   HDL 33.00* 06/04/2012 0956   CHOLHDL 5 06/04/2012 0956   VLDL 51.2* 06/04/2012 0956   LDLCALC 87 08/01/2010 0809    CBC    Component Value Date/Time   WBC 10.0 06/13/2012 1822   RBC 5.03 06/13/2012 1822   HGB 12.0 06/13/2012 1822   HCT 39.3 06/13/2012 1822   PLT 460* 06/13/2012 1822   MCV 78.1 06/13/2012 1822   MCH 23.9* 06/13/2012 1822   MCHC 30.5 06/13/2012 1822   RDW 15.4 06/13/2012 1822   LYMPHSABS 3.3 06/13/2012 1822   MONOABS 1.0 06/13/2012 1822   EOSABS 0.1 06/13/2012 1822   BASOSABS 0.0 06/13/2012 1822    Hgb A1C Lab Results  Component Value Date   HGBA1C 6.8* 06/04/2012        Assessment & Plan:   Anal itching:  No evidence of stool, yeast or hemorrhoids Advised pt to wipe good with baby wipes I advised her not to put the ketoconazole around her anus at this time as there is no evidence of yeast  RTC as needed

## 2013-01-17 ENCOUNTER — Telehealth: Payer: Self-pay

## 2013-01-17 NOTE — Telephone Encounter (Signed)
Pt.notified

## 2013-01-17 NOTE — Telephone Encounter (Signed)
It is ok if she feels she needs to take it

## 2013-01-17 NOTE — Telephone Encounter (Signed)
Pt left v/m; pt has appt to see surgeon on 01/21/13 for f/u and pt took loperamide on 01/16/13 for church event; pt only had a small BM today and wants to know if it is OK for pt to take Loperamide prior to see doctor on 01/21/13 since pt only had small BM since taking Loperamide yesterday.Please advise.

## 2013-01-21 ENCOUNTER — Encounter (INDEPENDENT_AMBULATORY_CARE_PROVIDER_SITE_OTHER): Payer: Self-pay | Admitting: General Surgery

## 2013-01-21 ENCOUNTER — Ambulatory Visit (INDEPENDENT_AMBULATORY_CARE_PROVIDER_SITE_OTHER): Payer: Medicare Other | Admitting: General Surgery

## 2013-01-21 VITALS — BP 128/82 | HR 78 | Resp 18 | Ht 62.0 in | Wt 199.8 lb

## 2013-01-21 DIAGNOSIS — Z09 Encounter for follow-up examination after completed treatment for conditions other than malignant neoplasm: Secondary | ICD-10-CM

## 2013-01-21 NOTE — Progress Notes (Signed)
Subjective:     Patient ID: Linda Thompson, female   DOB: 11/13/1955, 58 y.o.   MRN: [id number]  HPI 58 year old Caucasian female comes in for followup after undergoing excision of a left mid axillary subcutaneous mass on December 5 at the surgical center at [facility]. She states that she is doing well. She denies any fevers or chills. She denies any pain in the area. She denies any drainage from the area.  Review of Systems     Objective:   Physical Exam BP 128/82  Pulse 78  Resp 18  Ht 5\' 2"  (1.575 m)  Wt 199 lb 12.8 oz (90.629 kg)  BMI 36.53 kg/m2 Left mid axilla-well-healed horizontal incision. No cellulitis, induration or fluctuance. No hematoma or seroma    Assessment:     Status post excision of a left mid axilla lipoma     Plan:     We reviewed her pathology report which demonstrated a lipoma. She was provided a copy of her pathology report. All of her questions were asked and answered. I have early surgical activities. Followup as needed  Leighton Ruff. Redmond Pulling, MD, FACS General, Bariatric, & Minimally Invasive Surgery [facility], [place]

## 2013-01-21 NOTE — Patient Instructions (Signed)
Can lift anything you want too Can lift bottles of water

## 2013-02-03 ENCOUNTER — Ambulatory Visit (INDEPENDENT_AMBULATORY_CARE_PROVIDER_SITE_OTHER): Payer: Medicare Other | Admitting: Internal Medicine

## 2013-02-03 ENCOUNTER — Encounter: Payer: Self-pay | Admitting: Internal Medicine

## 2013-02-03 VITALS — BP 116/68 | HR 74 | Temp 97.2°F | Wt 198.5 lb

## 2013-02-03 DIAGNOSIS — J309 Allergic rhinitis, unspecified: Secondary | ICD-10-CM | POA: Diagnosis not present

## 2013-02-03 NOTE — Progress Notes (Signed)
HPI  Pt presents to the clinic today with c/o sneezing and cough. This started yesterday. The cough is unproductive. She notices and occasional sneeze every few days. Her main concern is that she noticed that "my tonsils have dropped". She thinks it is related to her sinus. She has tried saline nasal spray. She does have a history of allergies but does not take an allergy pill daily.  Review of Systems      Past Medical History  Diagnosis Date  . Allergic rhinitis, cause unspecified   . Pain in joint, ankle and foot   . Backache, unspecified     chronic LBP with radiculopathy  . Seborrheic dermatitis, unspecified   . Type II or unspecified type diabetes mellitus without mention of complication, not stated as uncontrolled   . History of fracture of arm     Right  . History of fracture of foot   . Dermatomycosis, unspecified   . Esophageal reflux   . Unspecified glaucoma   . Undiagnosed cardiac murmurs   . Diaphragmatic hernia without mention of obstruction or gangrene   . Other and unspecified hyperlipidemia   . Unspecified essential hypertension   . Irritable bowel syndrome   . Benign neoplasm of cerebral meninges   . Moderate intellectual disabilities   . Nausea alone   . Osteoarthrosis, unspecified whether generalized or localized, ankle and foot   . Pruritus ani   . Unspecified tinnitus   . Nonspecific elevation of levels of transaminase or lactic acid dehydrogenase (LDH)   . Leukorrhea, not specified as infective   . Obesity   . Hyperglycemia   . DDD (degenerative disc disease), lumbar   . Yeast infection of the skin     Frequent    Family History  Problem Relation Age of Onset  . Diabetes Mother   . Diabetes Maternal Aunt   . Heart disease Maternal Aunt      (had pacemaker)  . Diabetes Maternal Grandmother   . Stroke Mother   . Stroke Father   . Stroke Maternal Grandmother     History   Social History  . Marital Status: Single    Spouse Name: N/A   Number of Children: 0  . Years of Education: N/A   Occupational History  . Disabled (from arm fracture)    Social History Main Topics  . Smoking status: Never Smoker   . Smokeless tobacco: Never Used  . Alcohol Use: No  . Drug Use: No  . Sexual Activity: Not Currently   Other Topics Concern  . Not on file   Social History Narrative   Lives alone-with help from neighbors      Currently-disability for arm fracture      [name] (neighbor) is POA    Allergies  Allergen Reactions  . Ace Inhibitors     REACTION: COUGH  . Amoxicillin-Pot Clavulanate     REACTION: diarrhea  . Augmentin [Amoxicillin-Pot Clavulanate] Other (See Comments)  . Azithromycin Diarrhea  . Cyclobenzaprine Hcl     REACTION: diarrhea  . Flexeril [Cyclobenzaprine]   . Flonase [Fluticasone Propionate]     nosebleed  . Ibuprofen     REACTION: vomiting     Constitutional:  Denies headache, fatigue, fever or abrupt weight changes.  HEENT:  Positive sore throat. Denies eye redness, eye pain, pressure behind the eyes, facial pain, nasal congestion, ear pain, ringing in the ears, wax buildup, runny nose or bloody nose. Respiratory: Positive cough. Denies difficulty breathing or shortness  of breath.  Cardiovascular: Denies chest pain, chest tightness, palpitations or swelling in the hands or feet.   No other specific complaints in a complete review of systems (except as listed in HPI above).  Objective:   BP 116/68  Pulse 74  Temp(Src) 97.2 F (36.2 C) (Oral)  Wt 198 lb 8 oz (90.039 kg)  SpO2 97% Wt Readings from Last 3 Encounters:  02/03/13 198 lb 8 oz (90.039 kg)  01/21/13 199 lb 12.8 oz (90.629 kg)  01/11/13 199 lb 8 oz (90.493 kg)     General: Appears her stated age, obese but well developed, well nourished in NAD. HEENT: Head: normal shape and size; Eyes: sclera white, no icterus, conjunctiva pink, PERRLA and EOMs intact; Ears: Tm's gray and intact, normal light reflex; Nose: mucosa pink  and moist, septum midline; Throat/Mouth: + PND. Teeth present, mucosa erythematous and moist, no exudate noted, no lesions or ulcerations noted.  Neck: Neck supple, trachea midline. No massses, lumps or thyromegaly present.  Cardiovascular: Normal rate and rhythm. S1,S2 noted.  No murmur, rubs or gallops noted. No JVD or BLE edema. No carotid bruits noted. Pulmonary/Chest: Normal effort and positive vesicular breath sounds. No respiratory distress. No wheezes, rales or ronchi noted.      Assessment & Plan:   Allergic Rhinitis  Get some rest and drink plenty of water Do salt water gargles for the sore throat Get Claritin OTC and take daily  RTC as needed or if symptoms persist.

## 2013-02-03 NOTE — Patient Instructions (Signed)

## 2013-02-03 NOTE — Progress Notes (Signed)
Pre-visit discussion using our clinic review tool. No additional management support is needed unless otherwise documented below in the visit note.  

## 2013-02-04 ENCOUNTER — Ambulatory Visit: Payer: Medicare Other | Admitting: Family Medicine

## 2013-02-09 ENCOUNTER — Ambulatory Visit (INDEPENDENT_AMBULATORY_CARE_PROVIDER_SITE_OTHER): Payer: Medicare Other | Admitting: Family Medicine

## 2013-02-09 ENCOUNTER — Encounter: Payer: Self-pay | Admitting: Family Medicine

## 2013-02-09 VITALS — BP 116/74 | HR 96 | Temp 97.7°F | Ht 62.0 in | Wt 198.5 lb

## 2013-02-09 DIAGNOSIS — J020 Streptococcal pharyngitis: Secondary | ICD-10-CM | POA: Diagnosis not present

## 2013-02-09 DIAGNOSIS — J029 Acute pharyngitis, unspecified: Secondary | ICD-10-CM

## 2013-02-09 LAB — POCT RAPID STREP A (OFFICE): Rapid Strep A Screen: POSITIVE — AB

## 2013-02-09 MED ORDER — CEFUROXIME AXETIL 250 MG PO TABS: 250.0000 mg | ORAL_TABLET | Freq: Two times a day (BID) | ORAL | Status: DC

## 2013-02-09 NOTE — Progress Notes (Signed)
Pre-visit discussion using our clinic review tool. No additional management support is needed unless otherwise documented below in the visit note.  

## 2013-02-09 NOTE — Progress Notes (Signed)
Subjective:    Patient ID: Linda Thompson, female    DOB: 1955/02/27, 58 y.o.   MRN: [id number]  HPI Here for sore throat  Has had it off and on for several days  Was sneezing last week -took an antihistamine otc  Also has nasal saline spray  ST is moderate - does not feel swollen  Can feel her tonsils  No fever  No rash   Has pos RST today  Patient Active Problem List   Diagnosis Date Noted  . Strep pharyngitis 02/09/2013  . Sinus headache 12/15/2012  . Stress reaction 12/07/2012  . Low back pain 11/22/2012  . Labial swelling 11/03/2012  . Vaginal irritation 10/14/2012  . Diarrhea 06/23/2012  . Seborrheic keratosis 04/23/2012  . Chronic dermatitis of hands 09/12/2011  . Obesity 05/05/2011  . Candidal intertrigo 11/15/2010  . Irritable bowel syndrome 03/05/2010  . HEART MURMUR 10/12/2009  . TRANSAMINASES, SERUM, ELEVATED 11/22/2007  . DIABETES MELLITUS, TYPE II 08/13/2007  . ALLERGIC  RHINITIS 09/23/2006  . FUNGAL DERMATITIS 08/26/2006  . DERMATITIS, SEBORRHEIC NOS 08/26/2006  . HYPERLIPIDEMIA 03/11/2006  . MENTAL RETARDATION, MODERATE 03/11/2006  . GLAUCOMA NOS 03/11/2006  . HYPERTENSION 03/11/2006  . GERD 03/11/2006  . HIATAL HERNIA 03/11/2006  . PRURITUS ANI 03/11/2006  . OSTEOARTHRITIS, FOOT 03/11/2006  . Personal history of other musculoskeletal disorders(V13.59) 03/11/2006  . MENINGIOMA 12/20/2001   Past Medical History  Diagnosis Date  . Allergic rhinitis, cause unspecified   . Pain in joint, ankle and foot   . Backache, unspecified     chronic LBP with radiculopathy  . Seborrheic dermatitis, unspecified   . Type II or unspecified type diabetes mellitus without mention of complication, not stated as uncontrolled   . History of fracture of arm     Right  . History of fracture of foot   . Dermatomycosis, unspecified   . Esophageal reflux   . Unspecified glaucoma   . Undiagnosed cardiac murmurs   . Diaphragmatic hernia without mention of  obstruction or gangrene   . Other and unspecified hyperlipidemia   . Unspecified essential hypertension   . Irritable bowel syndrome   . Benign neoplasm of cerebral meninges   . Moderate intellectual disabilities   . Nausea alone   . Osteoarthrosis, unspecified whether generalized or localized, ankle and foot   . Pruritus ani   . Unspecified tinnitus   . Nonspecific elevation of levels of transaminase or lactic acid dehydrogenase (LDH)   . Leukorrhea, not specified as infective   . Obesity   . Hyperglycemia   . DDD (degenerative disc disease), lumbar   . Yeast infection of the skin     Frequent   Past Surgical History  Procedure Laterality Date  . Total abdominal hysterectomy    . Cataract extraction    . Elbow surgery      right elbow   History  Substance Use Topics  . Smoking status: Never Smoker   . Smokeless tobacco: Never Used  . Alcohol Use: No   Family History  Problem Relation Age of Onset  . Diabetes Mother   . Diabetes Maternal Aunt   . Heart disease Maternal Aunt      (had pacemaker)  . Diabetes Maternal Grandmother   . Stroke Mother   . Stroke Father   . Stroke Maternal Grandmother    Allergies  Allergen Reactions  . Ace Inhibitors     REACTION: COUGH  . Amoxicillin-Pot Clavulanate     REACTION: diarrhea  .  Augmentin [Amoxicillin-Pot Clavulanate] Other (See Comments)  . Azithromycin Diarrhea  . Cyclobenzaprine Hcl     REACTION: diarrhea  . Flexeril [Cyclobenzaprine]   . Flonase [Fluticasone Propionate]     nosebleed  . Ibuprofen     REACTION: vomiting   Current Outpatient Prescriptions on File Prior to Visit  Medication Sig Dispense Refill  . aspirin EC 81 MG tablet Take 81 mg by mouth daily.      Marland Kitchen desonide (DESOWEN) 0.05 % ointment Apply 1 application topically 2 (two) times daily.      Marland Kitchen diltiazem (TIAZAC) 240 MG 24 hr capsule Take 240 mg by mouth daily.      . hydrochlorothiazide (MICROZIDE) 12.5 MG capsule Take 1 capsule (12.5 mg total)  by mouth daily.  90 capsule  1  . ipratropium (ATROVENT) 0.06 % nasal spray Place 2 sprays into both nostrils 4 (four) times daily.  15 mL  1  . ketoconazole (NIZORAL) 2 % shampoo Apply 1 application topically 2 (two) times a week.  120 mL  3  . latanoprost (XALATAN) 0.005 % ophthalmic solution Place 1 drop into both eyes at bedtime.       . metFORMIN (GLUCOPHAGE) 500 MG tablet Take 250 mg by mouth 2 (two) times daily with a meal.       . nystatin (MYCOSTATIN/NYSTOP) 100000 UNIT/GM POWD Apply to affected areas with yeast three times daily as needed  2 Bottle  3  . omeprazole (PRILOSEC) 20 MG capsule Take 1 capsule (20 mg total) by mouth 2 (two) times daily.  180 capsule  1  . oxyCODONE (OXY IR/ROXICODONE) 5 MG immediate release tablet Take 1-2 tablets (5-10 mg total) by mouth every 4 (four) hours as needed for severe pain (Given at discharge from [facility]).  30 tablet  0  . potassium chloride SA (K-DUR,KLOR-CON) 20 MEQ tablet Take 1 tablet (20 mEq total) by mouth daily.  90 tablet  3  . pravastatin (PRAVACHOL) 40 MG tablet Take 1 tablet (40 mg total) by mouth every evening.  90 tablet  3  . Propylene Glycol (SYSTANE BALANCE OP) Apply 2 drops to eye 2 (two) times daily as needed (dry eyes).       . sodium chloride (OCEAN) 0.65 % nasal spray Place 1 spray into the nose as needed for congestion.      . [DISCONTINUED] Calcium Carbonate-Vitamin D (CALCIUM-VITAMIN D) 500-200 MG-UNIT per tablet Take 1 tablet by mouth 2 (two) times daily with a meal.        . [DISCONTINUED] fexofenadine (ALLEGRA) 180 MG tablet Take 1 tablet (180 mg total) by mouth daily.  15 tablet  0   No current facility-administered medications on file prior to visit.      Review of Systems Review of Systems  Constitutional: Negative for fever, appetite change, and unexpected weight change.  ENT pos for some rhinorrhea/ neg for facial pain pos for ST Eyes: Negative for pain and visual disturbance.  Respiratory: Negative for cough and  shortness of breath.   Cardiovascular: Negative for cp or palpitations    Gastrointestinal: Negative for nausea, diarrhea and constipation.  Genitourinary: Negative for urgency and frequency.  Skin: Negative for pallor or rash   Neurological: Negative for weakness, light-headedness, numbness and headaches.  Hematological: Negative for adenopathy. Does not bruise/bleed easily.  Psychiatric/Behavioral: Negative for dysphoric mood. The patient is not nervous/anxious.         Objective:   Physical Exam  Constitutional: She appears well-developed and well-nourished. No  distress.  obese and well appearing  Is a little fatigued today  HENT:  Head: Normocephalic and atraumatic.  Right Ear: External ear normal.  Left Ear: External ear normal.  Mouth/Throat: No oropharyngeal exudate.  Post throat injection no swelling or exudate Some clear rhinorrhea and drip No sinus tenderness  Eyes: Conjunctivae and EOM are normal. Pupils are equal, round, and reactive to light. Right eye exhibits no discharge. Left eye exhibits no discharge.  Neck: Normal range of motion. Neck supple.  Cardiovascular: Regular rhythm.  Exam reveals no gallop.   No murmur heard. Pulmonary/Chest: Effort normal and breath sounds normal. No respiratory distress. She has no wheezes. She has no rales.  Lymphadenopathy:    She has no cervical adenopathy.  Neurological: She is alert.  Skin: Skin is warm and dry. No rash noted.  Psychiatric: She has a normal mood and affect.  Baseline MR          Assessment & Plan:

## 2013-02-09 NOTE — Patient Instructions (Signed)
Take the antibiotic (generic ceftin 250 mg)- one pill twice daily for 7 days for strep throat  I sent it to your pharmacy  Take it with food Also eat yogurt  Update if not starting to improve in a week or if worsening     Strep Throat Strep throat is an infection of the throat. It is caused by a germ. Strep throat spreads from person to person by coughing, sneezing, or close contact. HOME CARE  Rinse your mouth (gargle) with warm salt water (1 teaspoon salt in 1 cup of water). Do this 3 to 4 times per day or as needed for comfort.  Family members with a sore throat or fever should see a doctor.  Make sure everyone in your house washes their hands well.  Do not share food, drinking cups, or personal items.  Eat soft foods until your sore throat gets better.  Drink enough water and fluids to keep your pee (urine) clear or pale yellow.  Rest.  Stay home from school, daycare, or work until you have taken medicine for 24 hours.  Only take medicine as told by your doctor.  Take your medicine as told. Finish it even if you start to feel better. GET HELP RIGHT AWAY IF:   You have new problems, such as throwing up (vomiting) or bad headaches.  You have a stiff or painful neck, chest pain, trouble breathing, or trouble swallowing.  You have very bad throat pain, drooling, or changes in your voice.  Your neck puffs up (swells) or gets red and tender.  You have a fever.  You are very tired, your mouth is dry, or you are peeing less than normal.  You cannot wake up completely.  You get a rash, cough, or earache.  You have green, yellow-brown, or bloody spit.  Your pain does not get better with medicine. MAKE SURE YOU:   Understand these instructions.  Will watch your condition.  Will get help right away if you are not doing well or get worse. Document Released: 06/25/2007 Document Revised: 03/31/2011 Document Reviewed: 03/07/2010 [facility] Patient Information 2014  [place].

## 2013-02-10 NOTE — Assessment & Plan Note (Signed)
Cover with ceftin Disc symptomatic care - see instructions on AVS  Update if not starting to improve in a week or if worsening

## 2013-02-13 ENCOUNTER — Emergency Department (HOSPITAL_COMMUNITY)
Admission: EM | Admit: 2013-02-13 | Discharge: 2013-02-13 | Disposition: A | Discharge status: Home / Self Care | Payer: Medicare Other | Attending: Emergency Medicine | Admitting: Emergency Medicine

## 2013-02-13 ENCOUNTER — Encounter (HOSPITAL_COMMUNITY): Payer: Self-pay | Admitting: Emergency Medicine

## 2013-02-13 DIAGNOSIS — T361X5A Adverse effect of cephalosporins and other beta-lactam antibiotics, initial encounter: Secondary | ICD-10-CM | POA: Insufficient documentation

## 2013-02-13 DIAGNOSIS — R5381 Other malaise: Secondary | ICD-10-CM | POA: Insufficient documentation

## 2013-02-13 DIAGNOSIS — Z90710 Acquired absence of both cervix and uterus: Secondary | ICD-10-CM | POA: Diagnosis not present

## 2013-02-13 DIAGNOSIS — R197 Diarrhea, unspecified: Secondary | ICD-10-CM | POA: Diagnosis not present

## 2013-02-13 DIAGNOSIS — Z79899 Other long term (current) drug therapy: Secondary | ICD-10-CM | POA: Insufficient documentation

## 2013-02-13 DIAGNOSIS — E669 Obesity, unspecified: Secondary | ICD-10-CM | POA: Diagnosis not present

## 2013-02-13 DIAGNOSIS — E785 Hyperlipidemia, unspecified: Secondary | ICD-10-CM | POA: Insufficient documentation

## 2013-02-13 DIAGNOSIS — Z8742 Personal history of other diseases of the female genital tract: Secondary | ICD-10-CM | POA: Insufficient documentation

## 2013-02-13 DIAGNOSIS — IMO0002 Reserved for concepts with insufficient information to code with codable children: Secondary | ICD-10-CM | POA: Insufficient documentation

## 2013-02-13 DIAGNOSIS — E119 Type 2 diabetes mellitus without complications: Secondary | ICD-10-CM | POA: Insufficient documentation

## 2013-02-13 DIAGNOSIS — Z7982 Long term (current) use of aspirin: Secondary | ICD-10-CM | POA: Diagnosis not present

## 2013-02-13 DIAGNOSIS — K219 Gastro-esophageal reflux disease without esophagitis: Secondary | ICD-10-CM | POA: Diagnosis not present

## 2013-02-13 DIAGNOSIS — R011 Cardiac murmur, unspecified: Secondary | ICD-10-CM | POA: Diagnosis not present

## 2013-02-13 DIAGNOSIS — Z872 Personal history of diseases of the skin and subcutaneous tissue: Secondary | ICD-10-CM | POA: Diagnosis not present

## 2013-02-13 DIAGNOSIS — Z8739 Personal history of other diseases of the musculoskeletal system and connective tissue: Secondary | ICD-10-CM | POA: Diagnosis not present

## 2013-02-13 DIAGNOSIS — H409 Unspecified glaucoma: Secondary | ICD-10-CM | POA: Insufficient documentation

## 2013-02-13 DIAGNOSIS — R5383 Other fatigue: Secondary | ICD-10-CM

## 2013-02-13 DIAGNOSIS — I10 Essential (primary) hypertension: Secondary | ICD-10-CM | POA: Diagnosis not present

## 2013-02-13 DIAGNOSIS — Z8781 Personal history of (healed) traumatic fracture: Secondary | ICD-10-CM | POA: Diagnosis not present

## 2013-02-13 DIAGNOSIS — G8929 Other chronic pain: Secondary | ICD-10-CM | POA: Insufficient documentation

## 2013-02-13 DIAGNOSIS — Z8669 Personal history of other diseases of the nervous system and sense organs: Secondary | ICD-10-CM | POA: Insufficient documentation

## 2013-02-13 LAB — URINE MICROSCOPIC-ADD ON

## 2013-02-13 LAB — URINALYSIS, ROUTINE W REFLEX MICROSCOPIC
Bilirubin Urine: NEGATIVE
GLUCOSE, UA: NEGATIVE mg/dL
Hgb urine dipstick: NEGATIVE
Ketones, ur: NEGATIVE mg/dL
Nitrite: NEGATIVE
PH: 7 (ref 5.0–8.0)
Protein, ur: NEGATIVE mg/dL
Specific Gravity, Urine: 1.018 (ref 1.005–1.030)
Urobilinogen, UA: 0.2 mg/dL (ref 0.0–1.0)

## 2013-02-13 LAB — POCT I-STAT, CHEM 8
BUN: 11 mg/dL (ref 6–23)
CHLORIDE: 107 meq/L (ref 96–112)
Calcium, Ion: 1.15 mmol/L (ref 1.12–1.23)
Creatinine, Ser: 0.6 mg/dL (ref 0.50–1.10)
Glucose, Bld: 134 mg/dL — ABNORMAL HIGH (ref 70–99)
HCT: 40 % (ref 36.0–46.0)
Hemoglobin: 13.6 g/dL (ref 12.0–15.0)
POTASSIUM: 3.9 meq/L (ref 3.7–5.3)
SODIUM: 143 meq/L (ref 137–147)
TCO2: 25 mmol/L (ref 0–100)

## 2013-02-13 NOTE — Discharge Instructions (Signed)
Please read and follow all provided instructions.  Your diagnoses today include:  1. Diarrhea     Tests performed today include:  Blood counts and electrolytes  Urine test to look for infection and pregnancy  Vital signs. See below for your results today.   Medications prescribed:   None  Take any prescribed medications only as directed.  Home care instructions:   Follow any educational materials contained in this packet.  Stop taking the strep throat medicine (cefuroxime)  Follow-up instructions: Please follow-up with your primary care provider in the next 3 days for further evaluation of your symptoms if you do not feel better. If you do not have a primary care doctor -- see below for referral information.   Return instructions:  SEEK IMMEDIATE MEDICAL ATTENTION IF:  The pain does not go away or becomes severe   A temperature above 101F develops   Repeated vomiting occurs (multiple episodes)   The pain becomes localized to portions of the abdomen. The right side could possibly be appendicitis. In an adult, the left lower portion of the abdomen could be colitis or diverticulitis.   Blood is being passed in stools or vomit (bright red or black tarry stools)   You develop chest pain, difficulty breathing, dizziness or fainting, or become confused, poorly responsive, or inconsolable (young children)  If you have any other emergent concerns regarding your health  Additional Information: Abdominal (belly) pain can be caused by many things. Your caregiver performed an examination and possibly ordered blood/urine tests and imaging (CT scan, x-rays, ultrasound). Many cases can be observed and treated at home after initial evaluation in the emergency department. Even though you are being discharged home, abdominal pain can be unpredictable. Therefore, you need a repeated exam if your pain does not resolve, returns, or worsens. Most patients with abdominal pain don't have to be  admitted to the hospital or have surgery, but serious problems like appendicitis and gallbladder attacks can start out as nonspecific pain. Many abdominal conditions cannot be diagnosed in one visit, so follow-up evaluations are very important.  Your vital signs today were: BP 113/87   Pulse 94   Temp(Src) 98.2 F (36.8 C) (Oral)   Resp 18   SpO2 98% If your blood pressure (bp) was elevated above 135/85 this visit, please have this repeated by your doctor within one month. --------------

## 2013-02-13 NOTE — ED Notes (Addendum)
Here for side effects/reaction to cefuroxime antibiotic. Has been taking since Wednesday for +strep. Prescribed by Velora Heckler at [facility]. Has developed general weakness and diarrhea since starting abx. Pt instructed to come to ED by nursing phone line d/t concern of weakness. Alert, NAD, calm, interactive, ambulatory, steady gait.

## 2013-02-13 NOTE — ED Provider Notes (Signed)
Medical screening examination/treatment/procedure(s) were performed by non-physician practitioner and as supervising physician I was immediately available for consultation/collaboration.  EKG Interpretation   None         Blanchie Dessert, MD 02/13/13 2140

## 2013-02-13 NOTE — ED Provider Notes (Signed)
CSN: [id number]     Arrival date & time 02/13/13  0654 History   First MD Initiated Contact with Patient 02/13/13 [phone]     Chief Complaint  Patient presents with  . Medication Reaction   (Consider location/radiation/quality/duration/timing/severity/associated sxs/prior Treatment) HPI Comments: Patient with history of diabetes, seen by PCP recently for sore throat and diagnosed with strep pharyngitis on rapid strep test. Patient was started on cefuroxime which she has taken for 3 days. Patient presents to the emergency department today with complaint of diarrhea that started yesterday morning. She has not had diarrhea today. Diarrhea is watery and nonbloody. Patient also has generally felt weak. She denies fever, other URI symptoms. Her sore throat has resolved. She denies chest pain or shortness of breath. She denies pain in her arms, shoulders, neck. She denies nausea or vomiting, abdominal pain, urinary symptoms including dysuria, urinary frequency. She denies skin rash. Patient denies signs of stroke including: facial droop, slurred speech, aphasia, weakness/numbness in extremities, imbalance/trouble walking. The onset of this condition was acute. The course is constant. Aggravating factors: none. Alleviating factors: none.    The history is provided by the patient.    Past Medical History  Diagnosis Date  . Allergic rhinitis, cause unspecified   . Pain in joint, ankle and foot   . Backache, unspecified     chronic LBP with radiculopathy  . Seborrheic dermatitis, unspecified   . Type II or unspecified type diabetes mellitus without mention of complication, not stated as uncontrolled   . History of fracture of arm     Right  . History of fracture of foot   . Dermatomycosis, unspecified   . Esophageal reflux   . Unspecified glaucoma   . Undiagnosed cardiac murmurs   . Diaphragmatic hernia without mention of obstruction or gangrene   . Other and unspecified hyperlipidemia   .  Unspecified essential hypertension   . Irritable bowel syndrome   . Benign neoplasm of cerebral meninges   . Moderate intellectual disabilities   . Nausea alone   . Osteoarthrosis, unspecified whether generalized or localized, ankle and foot   . Pruritus ani   . Unspecified tinnitus   . Nonspecific elevation of levels of transaminase or lactic acid dehydrogenase (LDH)   . Leukorrhea, not specified as infective   . Obesity   . Hyperglycemia   . DDD (degenerative disc disease), lumbar   . Yeast infection of the skin     Frequent   Past Surgical History  Procedure Laterality Date  . Total abdominal hysterectomy    . Cataract extraction    . Elbow surgery      right elbow   Family History  Problem Relation Age of Onset  . Diabetes Mother   . Diabetes Maternal Aunt   . Heart disease Maternal Aunt      (had pacemaker)  . Diabetes Maternal Grandmother   . Stroke Mother   . Stroke Father   . Stroke Maternal Grandmother    History  Substance Use Topics  . Smoking status: Never Smoker   . Smokeless tobacco: Never Used  . Alcohol Use: No   OB History   Grav Para Term Preterm Abortions TAB SAB Ect Mult Living                 Review of Systems  Constitutional: Negative for fever.  HENT: Negative for rhinorrhea and sore throat.   Eyes: Negative for redness.  Respiratory: Negative for cough.   Cardiovascular: Negative for  chest pain.  Gastrointestinal: Positive for diarrhea. Negative for nausea, vomiting and abdominal pain.  Genitourinary: Negative for dysuria.  Musculoskeletal: Negative for myalgias.  Skin: Negative for rash.  Neurological: Positive for weakness. Negative for headaches.    Allergies  Ace inhibitors; Amoxicillin-pot clavulanate; Augmentin; Azithromycin; Cefuroxime; Cyclobenzaprine hcl; Flexeril; Flonase; and Ibuprofen  Home Medications   Current Outpatient Rx  Name  Route  Sig  Dispense  Refill  . aspirin EC 81 MG tablet   Oral   Take 81 mg by  mouth daily.         . cefUROXime (CEFTIN) 250 MG tablet   Oral   Take 1 tablet (250 mg total) by mouth 2 (two) times daily with a meal.   14 tablet   0   . desonide (DESOWEN) 0.05 % ointment   Topical   Apply 1 application topically 2 (two) times daily.         Marland Kitchen diltiazem (TIAZAC) 240 MG 24 hr capsule   Oral   Take 240 mg by mouth daily.         . hydrochlorothiazide (MICROZIDE) 12.5 MG capsule   Oral   Take 1 capsule (12.5 mg total) by mouth daily.   90 capsule   1   . ipratropium (ATROVENT) 0.06 % nasal spray   Each Nare   Place 2 sprays into both nostrils 4 (four) times daily.   15 mL   1   . ketoconazole (NIZORAL) 2 % shampoo   Topical   Apply 1 application topically 2 (two) times a week.   120 mL   3   . latanoprost (XALATAN) 0.005 % ophthalmic solution   Both Eyes   Place 1 drop into both eyes at bedtime.          . metFORMIN (GLUCOPHAGE) 500 MG tablet   Oral   Take 500 mg by mouth 2 (two) times daily with a meal.          . nystatin (MYCOSTATIN/NYSTOP) 100000 UNIT/GM POWD      Apply to affected areas with yeast three times daily as needed   2 Bottle   3   . omeprazole (PRILOSEC) 20 MG capsule   Oral   Take 1 capsule (20 mg total) by mouth 2 (two) times daily.   180 capsule   1   . oxyCODONE (OXY IR/ROXICODONE) 5 MG immediate release tablet   Oral   Take 5-10 mg by mouth every 4 (four) hours as needed for severe pain.         . potassium chloride SA (K-DUR,KLOR-CON) 20 MEQ tablet   Oral   Take 1 tablet (20 mEq total) by mouth daily.   90 tablet   3   . pravastatin (PRAVACHOL) 40 MG tablet   Oral   Take 1 tablet (40 mg total) by mouth every evening.   90 tablet   3   . Propylene Glycol (SYSTANE BALANCE OP)   Ophthalmic   Apply 2 drops to eye 2 (two) times daily as needed (dry eyes).          . sodium chloride (OCEAN) 0.65 % nasal spray   Nasal   Place 1 spray into the nose as needed for congestion.          There  were no vitals taken for this visit. Physical Exam  Nursing note and vitals reviewed. Constitutional: She appears well-developed and well-nourished.  HENT:  Head: Normocephalic and atraumatic.  Right Ear: External  ear normal.  Left Ear: External ear normal.  Nose: Nose normal.  Mouth/Throat: Oropharynx is clear and moist. No oropharyngeal exudate.  Eyes: Conjunctivae are normal. Right eye exhibits no discharge. Left eye exhibits no discharge.  Neck: Normal range of motion. Neck supple.  Cardiovascular: Normal rate, regular rhythm and normal heart sounds.   No murmur heard. Pulmonary/Chest: Effort normal and breath sounds normal. No respiratory distress. She has no wheezes. She has no rales.  Abdominal: Soft. Bowel sounds are normal. There is no tenderness. There is no rebound and no guarding.  Neurological: She is alert.  Skin: Skin is warm and dry.  Psychiatric: She has a normal mood and affect.    ED Course  Procedures (including critical care time) Labs Review Labs Reviewed  URINALYSIS, ROUTINE W REFLEX MICROSCOPIC   Imaging Review No results found.  EKG Interpretation   None      7:26 AM Patient seen and examined. Work-up initiated. Patient to d/c Ceftin as symptoms now resolved. She does not appear clinically dehydrated. She is interactive and energetic and talkative on exam. Will check istat given c/o diarrhea, h/o DM, weakness. Will also send UA. She appears well. Anticipate d/c to home.   Vital signs reviewed and are as follows: Filed Vitals:   02/13/13 0734  BP: 113/87  Pulse: 94  Temp: 98.2 F (36.8 C)  Resp: 18   8:23 AM Pt informed of results. She will d/c ceftin, hydrate. Patient appears very well.   Urged to see PCP if worsening or not better in 2 days. Patient verbalizes understanding and agrees with plan.   MDM   1. Diarrhea    Patient with medication intolerance (diarrhea). As her throat symptoms are completely improved, will discontinue her  antibiotic. There is no evidence of PT abscess on exam.  Patient also has generalized weakness. No evidence of chest pain, SOB to suggest ACS. Checked Istat and UA both of which are unconcerning. Patient appears very well, alert. No focal weakness. Feel she is safe for discharge home with PCP followup as needed.    Carlisle Cater, PA-C 02/13/13 [phone]

## 2013-02-14 ENCOUNTER — Telehealth: Payer: Self-pay

## 2013-02-14 NOTE — Telephone Encounter (Signed)
Pt notified of Dr. Tower's comments  

## 2013-02-14 NOTE — Telephone Encounter (Signed)
Pt was seen in ED over weekend; pt has been taking Cefuroxine Axetil 250 mg one tab twice a day with meal; pt thinks causing diarrhea and pt is weak due to diarrhea. Pt wants to know if could get another med called in to [facility] to take the place of Cefuroxine; pt thinks Cefuroxine is too strong; pt request cb.Please advise.

## 2013-02-14 NOTE — Telephone Encounter (Signed)
Hours Triage) Fax: [phone] From: [facility] Date/ Time: 02/12/2013 8:06 AM Taken By: Hughes Better, RN Caller: [place]: Not Collected Patient: Linda Thompson, Linda Thompson DOB: Jun 03, 1955 Phone: [phone] Reason for Call: Caller was unable to be reached on callback - No Answer Regarding Appointment: No Appt Date: Appt Time: Unknown Provider: Reason: Details: Outcome:

## 2013-02-14 NOTE — Telephone Encounter (Signed)
Record Num: [id number] Operator: Prentice Docker Dob[place]il Patient Name: Naira Standiford Call Date & Time: 02/13/2013 6:10:02AM Patient Phone: [phone] PCP: Wynelle Fanny. Tower Patient Gender: Female PCP Fax : Patient DOB: 03/27/1955 Practice Name: [facility] Reason for Call: Caller: Kila/Patient; PCP: [name] [facility]); CB#: [phone]; Call regarding Diarrhea and on Abx; States no further diarrhea since 0530 02/12/13. States she is feeling a little weak. She is able to stand and walk. Speaking clearly and answring appropriately. Emergent S&S identified per Weakness or Paralysis:New onset of generalized weakness. Advised ED. Caller is going to monitor awhile longer and then decide. States she will call a friend to take her if she goes. Protocol(s) Used: Weakness or Paralysis Recommended Outcome per Protocol: See ED Immediately Reason for Outcome: New onset of generalized weakness Care Advice: ~ Protect the patient from falling or other harm. ~ Another adult should drive. ~ IMMEDIATE ACTION Write down provider's name. List or place the following in a bag for transport with the patient: current prescription and/or nonprescription medications; alternative treatments, therapies and medications; and street drugs. ~ Call EMS 911 if having chest pain, sudden severe shortness of breath, loss of consciousness, or signs of shock (such as unable to stand due to faintness, dizziness, or lightheadedness; new onset of confusion; slow to respond or difficult to awaken; skin is pale, gray, cool, or moist to touch; severe weakness). ~ Take sips of clear liquids (such as water, clear fruit juices without pulp, soda, tea or coffee without dairy or non-dairy creamer, clear broth or bouillon, oral hydration solution, or plain gelatin, fruit ices/popsicles, hard candy) as tolerated. Sucking on ice chips is another option. ~

## 2013-02-14 NOTE — Telephone Encounter (Signed)
To: [facility] (After Hours Triage) Fax: [phone] From: [facility] Date/ Time: 02/12/2013 7:18 AM Taken By: Hughes Better, RN Caller: [place]: Not Collected Patient: Linda Thompson, Linda Thompson DOB: 09-Apr-1955 Phone: [phone] Reason for Call: Caller was unable to be reached on callback - Left Message Regarding Appointment: No Appt Date: Appt Time: Unknown Provider: Reason: Details: Outcome:

## 2013-02-14 NOTE — Telephone Encounter (Signed)
Triage Record Num: [id number] Operator: Buford Dresser Patient Name: Linda Thompson Call Date & Time: 02/12/2013 7:56:57AM Patient Phone: [phone] PCP: Wynelle Fanny. Tower Patient Gender: Female PCP Fax : Patient DOB: 1955/12/17 Practice Name: [facility] Reason for Call: Caller: [name]/Patient; PCP: [name] [facility]); CB#: [phone]; Calling about diarrhea from med for Strep throat. Onset 02/12/13. Was seen at office on 02/09/13--dx with Strep throat and started on Cefuroxime. PCP advised her to take med with food and also eat yogurt. Guideline: Diarrhea or Other Change in Bowel Habits. Disposition: See Provider Within 24 Hours. Reason for Disposition: Diarrhea began 3-5 days after starting an antibiotic. Contacted Dr Duaine Dredge advised to continue taking antibiotic and call back if any worsening sxs. Protocol(s) Used: Diarrhea or Other Change in Bowel Habits Recommended Outcome per Protocol: See Provider within 24 hours Reason for Outcome: Diarrhea began 3-5 days after starting an antibiotic Care Advice: ~ Continue taking prescribed medications as directed until provider is consulted. ~ SYMPTOM / CONDITION MANAGEMENT Consider eating yogurts or other foods that contain probiotics, especially if you have had antibiotic associated diarrhea before. Ask your provider if probiotic supplements could help you before you begin to take the supplements.

## 2013-02-14 NOTE — Telephone Encounter (Signed)
Have her stop it - I am not going to replace it for now-let me know if sore throat comes back

## 2013-02-14 NOTE — Telephone Encounter (Signed)
Triage Record Num: [id number] Operator: Prentice Docker Dob[place]Trail Patient Name: Linda Thompson Call Date & Time: 02/13/2013 6:10:02AM Patient Phone: [phone] PCP: Wynelle Fanny. Tower Patient Gender: Female PCP Fax : Patient DOB: 1955-10-23 Practice Name: [facility] Reason for Call: Caller: Ge[place]/Patient; PCP: [name] [facility]); CB#: [phone]; Call regarding Diarrhea and on Abx; States no further diarrhea since 0530 02/12/13. States she is feeling a little weak. She is able to stand and walk. Speaking clearly and answring appropriately. Emergent S&S identified per Weakness or Paralysis:New onset of generalized weakness. Advised ED. Caller is going to monitor awhile longer and then decide. States she will call a friend to take her if she goes. Protocol(s) Used: Weakness or Paralysis Recommended Outcome per Protocol: See ED Immediately Reason for Outcome: New onset of generalized weakness Care Advice: ~ Protect the patient from falling or other harm. ~ Another adult should drive. ~ IMMEDIATE ACTION Write down provider's name. List or place the following in a bag for transport with the patient: current prescription and/or nonprescription medications; alternative treatments, therapies and medications; and street drugs. ~ Call EMS 911 if having chest pain, sudden severe shortness of breath, loss of consciousness, or signs of shock (such as unable to stand due to faintness, dizziness, or lightheadedness; new onset of confusion; slow to respond or difficult to awaken; skin is pale, gray, cool, or moist to touch; severe weakness). ~ Take sips of clear liquids (such as water, clear fruit juices without pulp, soda, tea or coffee without dairy or non-dairy creamer, clear broth or bouillon, oral hydration solution, or plain gelatin, fruit ices/popsicles, hard candy) as tolerated. Sucking on ice chips is another option. ~ 01/

## 2013-02-16 ENCOUNTER — Encounter: Payer: Self-pay | Admitting: Family Medicine

## 2013-02-16 ENCOUNTER — Telehealth: Payer: Self-pay | Admitting: Family Medicine

## 2013-02-16 ENCOUNTER — Ambulatory Visit (INDEPENDENT_AMBULATORY_CARE_PROVIDER_SITE_OTHER): Payer: Medicare Other | Admitting: Family Medicine

## 2013-02-16 VITALS — BP 104/70 | HR 87 | Temp 97.9°F | Ht 62.0 in | Wt 197.0 lb

## 2013-02-16 DIAGNOSIS — R197 Diarrhea, unspecified: Secondary | ICD-10-CM

## 2013-02-16 NOTE — Telephone Encounter (Signed)
[facility] Triage Call Report Triage Record Num: [id number] Operator: [name] Patient Name: Linda Thompson Call Date & Time: 02/15/2013 7:09:16PM Patient Phone: [phone] PCP: Wynelle Fanny. Tower Patient Gender: Female PCP Fax : Patient DOB: 03-21-1955 Practice Name: [facility] Reason for Call: Caller: [name]/Patient; PCP: [name] [facility]); CB#: [phone]; Call regarding to ask if her diarrhea is still r/t the ABX. Was being treated for strep throat with Cefuroxime and diarrhea developed. Was told 1/26 by Dr [name] to dc the ABX and would not replace since sx's improved; call back if sore throat returns. She says she's just had the first BM she's had since the diarrhea on 1/24. Rn explained that the ABX can cause changes to her intestines and affect her for a couple days after stopping the ABX. Explained that having one large stool is not indicative of diarrhea as much as perhaps that she's not had one in 2 days. She voices understanding and says she's going to call back tomorrow because she thinks her throat may be getting sore again. Protocol(s) Used: Information Only Call; No Symptom Triage (Adult) Recommended Outcome per Protocol: Provide Information or Advice Only Reason for Outcome: Follow-up call to recent contact; no triage required. Information provided from past call documentation, approved references or experience. Care Advice: ~ 02/15/2013 7:32:29PM Page 1 of 1 [email or web address]

## 2013-02-16 NOTE — Patient Instructions (Signed)
If diarrhea does not improve further over the week let me know  You do not need further medicine for strep throat  Keep drinking fluids You can go back to a regular diet  If you develop fever/ return of sore throat or other symptoms - let me know

## 2013-02-16 NOTE — Progress Notes (Signed)
   Subjective:    Patient ID: Linda Thompson, female    DOB: [birth year], 58 y.o.   MRN: [id number]  HPI Here for f/u of strep throat and also diarrhea   Was seen 1/21 for strep and given ceftin for that  She went to the ER on 1/25 - for diarrhea  She took the antibiotic 4 days - and then had diarrhea the next am - felt a little weak also   ua was neg  Throat looked clear    Chemistry      Component Value Date/Time   NA 143 02/13/2013 0750   K 3.9 02/13/2013 0750   CL 107 02/13/2013 0750   CO2 25 06/13/2012 1822   BUN 11 02/13/2013 0750   CREATININE 0.60 02/13/2013 0750      Component Value Date/Time   CALCIUM 9.1 06/13/2012 1822   ALKPHOS 159* 06/13/2012 1822   AST 18 06/13/2012 1822   ALT 21 06/13/2012 1822   BILITOT 0.1* 06/13/2012 1822      They inst her to stop the ceftin   Throat is feeling better - just dry  She has eaten chicken noodle soup  Eating yogurt to help restore bowel flora  Had last diarrhea last night - one watery stool  After that bms have been ok - today  No abd pain No fever No blood in stool    Review of Systems Review of Systems  Constitutional: Negative for fever, appetite change, fatigue and unexpected weight change.  ENt neg for ST or sinus pain or congestion  Eyes: Negative for pain and visual disturbance.  Respiratory: Negative for cough and shortness of breath.   Cardiovascular: Negative for cp or palpitations    Gastrointestinal: Negative for nausea, and constipation.  Genitourinary: Negative for urgency and frequency.  Skin: Negative for pallor or rash   Neurological: Negative for weakness, light-headedness, numbness and headaches.  Hematological: Negative for adenopathy. Does not bruise/bleed easily.  Psychiatric/Behavioral: Negative for dysphoric mood. The patient is not nervous/anxious.         Objective:   Physical Exam  Constitutional: She appears well-developed and well-nourished. No distress.  obese and well appearing   HENT:    Head: Normocephalic and atraumatic.  Right Ear: External ear normal.  Left Ear: External ear normal.  Nose: Nose normal.  Mouth/Throat: Oropharynx is clear and moist. No oropharyngeal exudate.  Boggy nares Throat is clear without erythema or swelling   Eyes: Conjunctivae and EOM are normal. Pupils are equal, round, and reactive to light. Right eye exhibits no discharge. Left eye exhibits no discharge. No scleral icterus.  Neck: Normal range of motion. Neck supple.  Cardiovascular: Normal rate, regular rhythm and normal heart sounds.   Pulmonary/Chest: Effort normal and breath sounds normal. No respiratory distress. She has no wheezes. She has no rales.  Abdominal: Soft. Bowel sounds are normal. She exhibits no distension and no mass. There is no tenderness. There is no rebound and no guarding.  Musculoskeletal: She exhibits no edema.  Lymphadenopathy:    She has no cervical adenopathy.  Neurological: She is alert. She has normal reflexes.  Skin: Skin is warm and dry. No rash noted.  Psychiatric: She has a normal mood and affect.  Baseline MR  Repeats herself frequently           Assessment & Plan:

## 2013-02-16 NOTE — Progress Notes (Signed)
Pre-visit discussion using our clinic review tool. No additional management support is needed unless otherwise documented below in the visit note.  

## 2013-02-16 NOTE — Telephone Encounter (Signed)
Patient Information:  Caller Name: Carl  Phone: [phone]  Patient: Linda Thompson, Linda Thompson  Gender: Female  DOB: 03-15-1955  Age: 58 Years  PCP: [name] [facility])  Office Follow Up:  Does the office need to follow up with this patient?: No  Instructions For The Office: N/A  RN Note:  Does not test blood sugar.  Multiple diarrhea stools overnight but < 15 in 24 hours. Advised to keep appt for 02/16/13 at 1800 for re-assessment of sore throat due to antibiotic treatment interruption and ongoing diarrhea.   Symptoms  Reason For Call & Symptoms: Diarrhea continues after stopping antibiotic.  Seen in ED 02/13/13; antibiotic was stopped.  Office staff called her to advise appointment if symptoms continued; scheduled  for 02/16/13.  Was told it will take a "couple of days" to get out of her system.  Sore throat present "if talks too much." Noted chills during the night.  Reviewed Health History In EMR: Yes  Reviewed Medications In EMR: Yes  Reviewed Allergies In EMR: Yes  Reviewed Surgeries / Procedures: Yes  Date of Onset of Symptoms: 02/12/2013  Treatments Tried: Antibiotic stopped 02/12/13, bland diet, yogurt, diet Ginger Ale, Pepto Bismol  Treatments Tried Worked: Yes  Guideline(s) Used:  Diarrhea  Disposition Per Guideline:   Callback by PCP Today  Reason For Disposition Reached:   Recent antibiotic therapy (i.e., within last 2 months)  Advice Given:  Fluids:  Drink more fluids, at least 8-10 glasses (8 oz or 240 ml) daily.  Supplement this with saltine crackers or soups to make certain that you are getting sufficient fluid and salt to meet your body's needs.  Nutrition:  Ideal initial foods include boiled starches/cereals (e.g., potatoes, rice, noodles, wheat, oats) with a small amount of salt to taste.  Other acceptable foods include: bananas, yogurt, crackers, soup.  Call Back If:  Signs of dehydration occur (e.g., no urine for more than 12 hours, very dry mouth,  lightheaded, etc.)  Diarrhea lasts over 7 days  You become worse.  RN Overrode Recommendation:  Patient Already Has Appt, Document Patient  Keep appointment for 02/16/13 at 1800 with Dr [name]

## 2013-02-17 NOTE — Assessment & Plan Note (Signed)
Improving after d/c  ceftin  Has baseline IBS -made worse with abx  Disc use of loperimide prn  Fluid intake  I think she can return to regular diet - warned she may still have a few more episodes Will update if symptoms worsen or do not improve

## 2013-02-18 ENCOUNTER — Ambulatory Visit: Payer: Medicare Other | Admitting: Family Medicine

## 2013-02-18 ENCOUNTER — Telehealth: Payer: Self-pay | Admitting: Family Medicine

## 2013-02-18 MED ORDER — LOPERAMIDE HCL 1 MG/5ML PO LIQD: 1.0000 mg | Freq: Four times a day (QID) | ORAL | Status: DC | PRN

## 2013-02-18 NOTE — Telephone Encounter (Signed)
Please refill times one  

## 2013-02-18 NOTE — Telephone Encounter (Signed)
done

## 2013-02-18 NOTE — Telephone Encounter (Signed)
Pt left triage form requesting Loperamide Oral Suspension.  It looks like it was last prescribed in July of 2014.  Ok to refill?

## 2013-02-28 ENCOUNTER — Telehealth: Payer: Self-pay | Admitting: Family Medicine

## 2013-02-28 NOTE — Telephone Encounter (Signed)
ok 

## 2013-02-28 NOTE — Telephone Encounter (Signed)
Pt would like to switch to [place] due location. Can I sch?

## 2013-02-28 NOTE — Telephone Encounter (Signed)
That is fine with me - I know she is moving soon/ or has already  Please call me if any questions about her -thanks

## 2013-03-01 NOTE — Telephone Encounter (Signed)
Pt will cb to sch °

## 2013-03-23 DIAGNOSIS — Z961 Presence of intraocular lens: Secondary | ICD-10-CM | POA: Diagnosis not present

## 2013-03-23 DIAGNOSIS — H4011X0 Primary open-angle glaucoma, stage unspecified: Secondary | ICD-10-CM | POA: Diagnosis not present

## 2013-03-31 ENCOUNTER — Ambulatory Visit (INDEPENDENT_AMBULATORY_CARE_PROVIDER_SITE_OTHER): Payer: Medicare Other | Admitting: Family

## 2013-03-31 ENCOUNTER — Encounter: Payer: Self-pay | Admitting: Family

## 2013-03-31 VITALS — HR 107 | Ht 62.0 in | Wt 203.0 lb

## 2013-03-31 DIAGNOSIS — E119 Type 2 diabetes mellitus without complications: Secondary | ICD-10-CM | POA: Diagnosis not present

## 2013-03-31 DIAGNOSIS — F71 Moderate intellectual disabilities: Secondary | ICD-10-CM

## 2013-03-31 DIAGNOSIS — I10 Essential (primary) hypertension: Secondary | ICD-10-CM | POA: Diagnosis not present

## 2013-03-31 DIAGNOSIS — B372 Candidiasis of skin and nail: Secondary | ICD-10-CM

## 2013-03-31 DIAGNOSIS — K219 Gastro-esophageal reflux disease without esophagitis: Secondary | ICD-10-CM

## 2013-03-31 LAB — BASIC METABOLIC PANEL
BUN: 13 mg/dL (ref 6–23)
CHLORIDE: 108 meq/L (ref 96–112)
CO2: 29 mEq/L (ref 19–32)
CREATININE: 0.6 mg/dL (ref 0.4–1.2)
Calcium: 8.8 mg/dL (ref 8.4–10.5)
GFR: 104.84 mL/min (ref >60.00)
Glucose, Bld: 84 mg/dL (ref 70–99)
Potassium: 4.2 mEq/L (ref 3.5–5.1)
SODIUM: 143 meq/L (ref 135–145)

## 2013-03-31 LAB — CBC WITH DIFFERENTIAL/PLATELET
Basophils Absolute: 0 10*3/uL (ref 0.0–0.1)
Basophils Relative: 0.4 % (ref 0.0–3.0)
EOS PCT: 1.8 % (ref 0.0–5.0)
Eosinophils Absolute: 0.1 10*3/uL (ref 0.0–0.7)
HCT: 36.5 % (ref 36.0–46.0)
Hemoglobin: 11.1 g/dL — ABNORMAL LOW (ref 12.0–15.0)
LYMPHS PCT: 18.9 % (ref 12.0–46.0)
Lymphs Abs: 1.4 10*3/uL (ref 0.7–4.0)
MCHC: 30.3 g/dL (ref 30.0–36.0)
MCV: 71.5 fl — ABNORMAL LOW (ref 78.0–100.0)
MONO ABS: 0.8 10*3/uL (ref 0.1–1.0)
MONOS PCT: 9.9 % (ref 3.0–12.0)
Neutro Abs: 5.2 10*3/uL (ref 1.4–7.7)
Neutrophils Relative %: 69 % (ref 43.0–77.0)
PLATELETS: 466 10*3/uL — AB (ref 150.0–400.0)
RBC: 5.11 Mil/uL (ref 3.87–5.11)
RDW: 18.5 % — ABNORMAL HIGH (ref 11.5–14.6)
WBC: 7.6 10*3/uL (ref 4.5–10.5)

## 2013-03-31 LAB — HEMOGLOBIN A1C: HEMOGLOBIN A1C: 7.1 % — AB (ref 4.6–6.5)

## 2013-03-31 LAB — HEPATIC FUNCTION PANEL
ALBUMIN: 3.6 g/dL (ref 3.5–5.2)
ALT: 25 U/L (ref 0–35)
AST: 23 U/L (ref 0–37)
Alkaline Phosphatase: 118 U/L — ABNORMAL HIGH (ref 39–117)
Bilirubin, Direct: 0 mg/dL (ref 0.0–0.3)
TOTAL PROTEIN: 7 g/dL (ref 6.0–8.3)
Total Bilirubin: 0.4 mg/dL (ref 0.3–1.2)

## 2013-03-31 NOTE — Progress Notes (Signed)
Subjective:    Patient ID: Linda Thompson, female    DOB: December 17, 1955, 58 y.o.   MRN: [id number]  HPI 58 year old white female, with moderate intellectual disability is in today to be established. She has a history of type 2 diabetes, hypertension, hyperlipidemia, obesity, GERD, irritable bowel syndrome with chronic diarrhea. Currently stable on all her medications. Denies any concerns today.  Reports keeping nystatin on hand at home due to candida infections that frequently occur under the breasts. She also has occasional candida in the groin area related to land adult diapers. Review of Systems  Constitutional: Negative.   HENT: Negative.   Respiratory: Negative.   Cardiovascular: Negative.   Gastrointestinal: Positive for diarrhea. Negative for constipation, abdominal distention and anal bleeding.       Irritable bowel syndrome  Endocrine: Negative.   Genitourinary: Negative.   Musculoskeletal: Negative.   Skin: Negative.   Neurological: Negative.   Psychiatric/Behavioral:       Moderate intellectual disability   Past Medical History  Diagnosis Date  . Allergic rhinitis, cause unspecified   . Pain in joint, ankle and foot   . Backache, unspecified     chronic LBP with radiculopathy  . Seborrheic dermatitis, unspecified   . Type II or unspecified type diabetes mellitus without mention of complication, not stated as uncontrolled   . History of fracture of arm     Right  . History of fracture of foot   . Dermatomycosis, unspecified   . Esophageal reflux   . Unspecified glaucoma   . Undiagnosed cardiac murmurs   . Diaphragmatic hernia without mention of obstruction or gangrene   . Other and unspecified hyperlipidemia   . Unspecified essential hypertension   . Irritable bowel syndrome   . Benign neoplasm of cerebral meninges   . Moderate intellectual disabilities   . Nausea alone   . Osteoarthrosis, unspecified whether generalized or localized, ankle and foot   .  Pruritus ani   . Unspecified tinnitus   . Nonspecific elevation of levels of transaminase or lactic acid dehydrogenase (LDH)   . Leukorrhea, not specified as infective   . Obesity   . Hyperglycemia   . DDD (degenerative disc disease), lumbar   . Yeast infection of the skin     Frequent    History   Social History  . Marital Status: Single    Spouse Name: N/A    Number of Children: 0  . Years of Education: N/A   Occupational History  . Disabled (from arm fracture)    Social History Main Topics  . Smoking status: Never Smoker   . Smokeless tobacco: Never Used  . Alcohol Use: No  . Drug Use: No  . Sexual Activity: Not Currently   Other Topics Concern  . Not on file   Social History Narrative   Lives alone-with help from neighbors      Currently-disability for arm fracture      [name] (neighbor) is POA    Past Surgical History  Procedure Laterality Date  . Total abdominal hysterectomy    . Cataract extraction    . Elbow surgery      right elbow    Family History  Problem Relation Age of Onset  . Diabetes Mother   . Diabetes Maternal Aunt   . Heart disease Maternal Aunt      (had pacemaker)  . Diabetes Maternal Grandmother   . Stroke Mother   . Stroke Father   . Stroke Maternal  Grandmother     Allergies  Allergen Reactions  . Ace Inhibitors Cough  . Amoxicillin-Pot Clavulanate Diarrhea  . Augmentin [Amoxicillin-Pot Clavulanate] Other (See Comments)    diarrhea  . Azithromycin Diarrhea  . Ceftin [Cefuroxime Axetil]     diarrhea  . Cefuroxime Diarrhea and Other (See Comments)    Generalized weakness  . Cyclobenzaprine Hcl Diarrhea  . Flexeril [Cyclobenzaprine] Diarrhea  . Flonase [Fluticasone Propionate] Other (See Comments)    Nose bleed  . Ibuprofen Other (See Comments)    REACTION: vomiting    Current Outpatient Prescriptions on File Prior to Visit  Medication Sig Dispense Refill  . aspirin EC 81 MG tablet Take 81 mg by mouth daily.       . cefUROXime (CEFTIN) 250 MG tablet Take 1 tablet (250 mg total) by mouth 2 (two) times daily with a meal.  14 tablet  0  . desonide (DESOWEN) 0.05 % ointment Apply 1 application topically 2 (two) times daily.      Marland Kitchen diltiazem (TIAZAC) 240 MG 24 hr capsule Take 240 mg by mouth daily.      . hydrochlorothiazide (MICROZIDE) 12.5 MG capsule Take 1 capsule (12.5 mg total) by mouth daily.  90 capsule  1  . ipratropium (ATROVENT) 0.06 % nasal spray Place 2 sprays into both nostrils 4 (four) times daily.  15 mL  1  . ketoconazole (NIZORAL) 2 % shampoo Apply 1 application topically 2 (two) times a week.  120 mL  3  . latanoprost (XALATAN) 0.005 % ophthalmic solution Place 1 drop into both eyes at bedtime.       Marland Kitchen loperamide (IMODIUM) 1 MG/5ML solution Take 5 mLs (1 mg total) by mouth 4 (four) times daily as needed for diarrhea or loose stools.  120 mL  0  . metFORMIN (GLUCOPHAGE) 500 MG tablet Take 500 mg by mouth 2 (two) times daily with a meal.       . nystatin (MYCOSTATIN/NYSTOP) 100000 UNIT/GM POWD Apply to affected areas with yeast three times daily as needed  2 Bottle  3  . omeprazole (PRILOSEC) 20 MG capsule Take 1 capsule (20 mg total) by mouth 2 (two) times daily.  180 capsule  1  . oxyCODONE (OXY IR/ROXICODONE) 5 MG immediate release tablet Take 5-10 mg by mouth every 4 (four) hours as needed for severe pain.      . potassium chloride SA (K-DUR,KLOR-CON) 20 MEQ tablet Take 1 tablet (20 mEq total) by mouth daily.  90 tablet  3  . pravastatin (PRAVACHOL) 40 MG tablet Take 1 tablet (40 mg total) by mouth every evening.  90 tablet  3  . Propylene Glycol (SYSTANE BALANCE OP) Apply 2 drops to eye 2 (two) times daily as needed (dry eyes).       . sodium chloride (OCEAN) 0.65 % nasal spray Place 1 spray into the nose as needed for congestion.      . [DISCONTINUED] Calcium Carbonate-Vitamin D (CALCIUM-VITAMIN D) 500-200 MG-UNIT per tablet Take 1 tablet by mouth 2 (two) times daily with a meal.        .  [DISCONTINUED] fexofenadine (ALLEGRA) 180 MG tablet Take 1 tablet (180 mg total) by mouth daily.  15 tablet  0   No current facility-administered medications on file prior to visit.    Pulse 107  Ht 5\' 2"  (1.575 m)  Wt 203 lb (92.08 kg)  BMI 37.12 kg/m2chart    Objective:   Physical Exam  Constitutional: She is oriented to person,  place, and time. She appears well-developed and well-nourished.  HENT:  Right Ear: External ear normal.  Left Ear: External ear normal.  Nose: Nose normal.  Mouth/Throat: Oropharynx is clear and moist.  Neck: Normal range of motion. Neck supple.  Cardiovascular: Normal rate, regular rhythm and normal heart sounds.   Pulmonary/Chest: Effort normal and breath sounds normal.  Abdominal: Soft. Bowel sounds are normal.  Musculoskeletal: Normal range of motion.  Neurological: She is alert and oriented to person, place, and time.  Skin: Skin is warm and dry.  Psychiatric: She has a normal mood and affect.          Assessment & Plan:  Diamond was seen today for establish care.  Diagnoses and associated orders for this visit:  Type II or unspecified type diabetes mellitus without mention of complication, not stated as uncontrolled - Hemoglobin J5K - Basic Metabolic Panel - Hepatic Function Panel  Morbid obesity  Moderate intellectual disabilities  GERD (gastroesophageal reflux disease)  Unspecified essential hypertension - Basic Metabolic Panel - Hepatic Function Panel - CBC with Differential  Candidal skin infection    call the office with any questions or concerns. Recheck in 4 months and as needed.

## 2013-03-31 NOTE — Progress Notes (Signed)
Pre visit review using our clinic review tool, if applicable. No additional management support is needed unless otherwise documented below in the visit note. 

## 2013-03-31 NOTE — Patient Instructions (Signed)
Exercise to Stay Healthy Exercise helps you become and stay healthy. EXERCISE IDEAS AND TIPS Choose exercises that:  You enjoy.  Fit into your day. You do not need to exercise really hard to be healthy. You can do exercises at a slow or medium level and stay healthy. You can:  Stretch before and after working out.  Try yoga, Pilates, or tai chi.  Lift weights.  Walk fast, swim, jog, run, climb stairs, bicycle, dance, or rollerskate.  Take aerobic classes. Exercises that burn about 150 calories:  Running 1  miles in 15 minutes.  Playing volleyball for 45 to 60 minutes.  Washing and waxing a car for 45 to 60 minutes.  Playing touch football for 45 minutes.  Walking 1  miles in 35 minutes.  Pushing a stroller 1  miles in 30 minutes.  Playing basketball for 30 minutes.  Raking leaves for 30 minutes.  Bicycling 5 miles in 30 minutes.  Walking 2 miles in 30 minutes.  Dancing for 30 minutes.  Shoveling snow for 15 minutes.  Swimming laps for 20 minutes.  Walking up stairs for 15 minutes.  Bicycling 4 miles in 15 minutes.  Gardening for 30 to 45 minutes.  Jumping rope for 15 minutes.  Washing windows or floors for 45 to 60 minutes. Document Released: 02/08/2010 Document Revised: 03/31/2011 Document Reviewed: 02/08/2010 Exit[employer] Patient Information 2014 [employer].  

## 2013-04-01 ENCOUNTER — Telehealth: Payer: Self-pay | Admitting: Family Medicine

## 2013-04-01 NOTE — Telephone Encounter (Signed)
Relevant patient education assigned to patient using Emmi. ° °

## 2013-04-06 ENCOUNTER — Other Ambulatory Visit: Payer: Medicare Other

## 2013-04-06 ENCOUNTER — Telehealth: Payer: Self-pay

## 2013-04-06 NOTE — Telephone Encounter (Signed)
Relevant patient education assigned to patient using Emmi. ° °

## 2013-04-08 ENCOUNTER — Ambulatory Visit: Payer: Medicare Other

## 2013-04-12 ENCOUNTER — Ambulatory Visit (INDEPENDENT_AMBULATORY_CARE_PROVIDER_SITE_OTHER): Payer: Medicare Other | Admitting: Family

## 2013-04-12 ENCOUNTER — Encounter: Payer: Self-pay | Admitting: Family

## 2013-04-12 VITALS — BP 108/62 | HR 77 | Temp 98.3°F | Ht 62.0 in | Wt 200.0 lb

## 2013-04-12 DIAGNOSIS — J309 Allergic rhinitis, unspecified: Secondary | ICD-10-CM

## 2013-04-12 MED ORDER — METHYLPREDNISOLONE ACETATE 40 MG/ML IJ SUSP
80.0000 mg | Freq: Once | INTRAMUSCULAR | Status: AC
  Administered 2013-04-12: 80 mg via INTRAMUSCULAR

## 2013-04-12 NOTE — Patient Instructions (Signed)
Allergic Rhinitis Allergic rhinitis is when the mucous membranes in the nose respond to allergens. Allergens are particles in the air that cause your body to have an allergic reaction. This causes you to release allergic antibodies. Through a chain of events, these eventually cause you to release histamine into the blood stream. Although meant to protect the body, it is this release of histamine that causes your discomfort, such as frequent sneezing, congestion, and an itchy, runny nose.  CAUSES  Seasonal allergic rhinitis (hay fever) is caused by pollen allergens that may come from grasses, trees, and weeds. Year-round allergic rhinitis (perennial allergic rhinitis) is caused by allergens such as house dust mites, pet dander, and mold spores.  SYMPTOMS   Nasal stuffiness (congestion).  Itchy, runny nose with sneezing and tearing of the eyes. DIAGNOSIS  Your health care provider can help you determine the allergen or allergens that trigger your symptoms. If you and your health care provider are unable to determine the allergen, skin or blood testing may be used. TREATMENT  Allergic Rhinitis does not have a cure, but it can be controlled by:  Medicines and allergy shots (immunotherapy).  Avoiding the allergen. Hay fever may often be treated with antihistamines in pill or nasal spray forms. Antihistamines block the effects of histamine. There are over-the-counter medicines that may help with nasal congestion and swelling around the eyes. Check with your health care provider before taking or giving this medicine.  If avoiding the allergen or the medicine prescribed do not work, there are many new medicines your health care provider can prescribe. Stronger medicine may be used if initial measures are ineffective. Desensitizing injections can be used if medicine and avoidance does not work. Desensitization is when a patient is given ongoing shots until the body becomes less sensitive to the allergen.  Make sure you follow up with your health care provider if problems continue. HOME CARE INSTRUCTIONS It is not possible to completely avoid allergens, but you can reduce your symptoms by taking steps to limit your exposure to them. It helps to know exactly what you are allergic to so that you can avoid your specific triggers. SEEK MEDICAL CARE IF:   You have a fever.  You develop a cough that does not stop easily (persistent).  You have shortness of breath.  You start wheezing.  Symptoms interfere with normal daily activities. Document Released: 10/01/2000 Document Revised: 10/27/2012 Document Reviewed: 09/13/2012 Exit[employer] Patient Information 2014 [employer].  

## 2013-04-12 NOTE — Progress Notes (Signed)
Subjective:    Patient ID: Linda Thompson, female    DOB: 04-05-1955, 58 y.o.   MRN: [id number]  HPI  58 year old caucasian female, nonsmoker presenting today for nasal congestion, cough, sore throat, itchy eyes and phlegm.  The symptoms began two weeks ago.  She has used over-the-counter Claritin with positive results.      Review of Systems  Constitutional: Negative for fever and chills.  HENT: Positive for congestion and postnasal drip. Negative for ear pain, sinus pressure and sneezing.   Eyes: Positive for itching.  Respiratory: Positive for cough. Negative for shortness of breath and wheezing.   Cardiovascular: Negative.   Allergic/Immunologic: Negative.   Neurological: Negative for headaches.   Past Medical History  Diagnosis Date  . Allergic rhinitis, cause unspecified   . Pain in joint, ankle and foot   . Backache, unspecified     chronic LBP with radiculopathy  . Seborrheic dermatitis, unspecified   . Type II or unspecified type diabetes mellitus without mention of complication, not stated as uncontrolled   . History of fracture of arm     Right  . History of fracture of foot   . Dermatomycosis, unspecified   . Esophageal reflux   . Unspecified glaucoma   . Undiagnosed cardiac murmurs   . Diaphragmatic hernia without mention of obstruction or gangrene   . Other and unspecified hyperlipidemia   . Unspecified essential hypertension   . Irritable bowel syndrome   . Benign neoplasm of cerebral meninges   . Moderate intellectual disabilities   . Nausea alone   . Osteoarthrosis, unspecified whether generalized or localized, ankle and foot   . Pruritus ani   . Unspecified tinnitus   . Nonspecific elevation of levels of transaminase or lactic acid dehydrogenase (LDH)   . Leukorrhea, not specified as infective   . Obesity   . Hyperglycemia   . DDD (degenerative disc disease), lumbar   . Yeast infection of the skin     Frequent    History   Social History  .  Marital Status: Single    Spouse Name: N/A    Number of Children: 0  . Years of Education: N/A   Occupational History  . Disabled (from arm fracture)    Social History Main Topics  . Smoking status: Never Smoker   . Smokeless tobacco: Never Used  . Alcohol Use: No  . Drug Use: No  . Sexual Activity: Not Currently   Other Topics Concern  . Not on file   Social History Narrative   Lives alone-with help from neighbors      Currently-disability for arm fracture      [name] (neighbor) is POA    Past Surgical History  Procedure Laterality Date  . Total abdominal hysterectomy    . Cataract extraction    . Elbow surgery      right elbow    Family History  Problem Relation Age of Onset  . Diabetes Mother   . Diabetes Maternal Aunt   . Heart disease Maternal Aunt      (had pacemaker)  . Diabetes Maternal Grandmother   . Stroke Mother   . Stroke Father   . Stroke Maternal Grandmother     Allergies  Allergen Reactions  . Ace Inhibitors Cough  . Amoxicillin-Pot Clavulanate Diarrhea  . Augmentin [Amoxicillin-Pot Clavulanate] Other (See Comments)    diarrhea  . Azithromycin Diarrhea  . Ceftin [Cefuroxime Axetil]     diarrhea  .  Cefuroxime Diarrhea and Other (See Comments)    Generalized weakness  . Cyclobenzaprine Hcl Diarrhea  . Flexeril [Cyclobenzaprine] Diarrhea  . Flonase [Fluticasone Propionate] Other (See Comments)    Nose bleed  . Ibuprofen Other (See Comments)    REACTION: vomiting    Current Outpatient Prescriptions on File Prior to Visit  Medication Sig Dispense Refill  . aspirin EC 81 MG tablet Take 81 mg by mouth daily.      . cefUROXime (CEFTIN) 250 MG tablet Take 1 tablet (250 mg total) by mouth 2 (two) times daily with a meal.  14 tablet  0  . desonide (DESOWEN) 0.05 % ointment Apply 1 application topically 2 (two) times daily.      Marland Kitchen diltiazem (TIAZAC) 240 MG 24 hr capsule Take 240 mg by mouth daily.      . hydrochlorothiazide (MICROZIDE)  12.5 MG capsule Take 1 capsule (12.5 mg total) by mouth daily.  90 capsule  1  . ipratropium (ATROVENT) 0.06 % nasal spray Place 2 sprays into both nostrils 4 (four) times daily.  15 mL  1  . ketoconazole (NIZORAL) 2 % shampoo Apply 1 application topically 2 (two) times a week.  120 mL  3  . latanoprost (XALATAN) 0.005 % ophthalmic solution Place 1 drop into both eyes at bedtime.       Marland Kitchen loperamide (IMODIUM) 1 MG/5ML solution Take 5 mLs (1 mg total) by mouth 4 (four) times daily as needed for diarrhea or loose stools.  120 mL  0  . metFORMIN (GLUCOPHAGE) 500 MG tablet Take 500 mg by mouth 2 (two) times daily with a meal.       . nystatin (MYCOSTATIN/NYSTOP) 100000 UNIT/GM POWD Apply to affected areas with yeast three times daily as needed  2 Bottle  3  . omeprazole (PRILOSEC) 20 MG capsule Take 1 capsule (20 mg total) by mouth 2 (two) times daily.  180 capsule  1  . oxyCODONE (OXY IR/ROXICODONE) 5 MG immediate release tablet Take 5-10 mg by mouth every 4 (four) hours as needed for severe pain.      . potassium chloride SA (K-DUR,KLOR-CON) 20 MEQ tablet Take 1 tablet (20 mEq total) by mouth daily.  90 tablet  3  . pravastatin (PRAVACHOL) 40 MG tablet Take 1 tablet (40 mg total) by mouth every evening.  90 tablet  3  . Propylene Glycol (SYSTANE BALANCE OP) Apply 2 drops to eye 2 (two) times daily as needed (dry eyes).       . sodium chloride (OCEAN) 0.65 % nasal spray Place 1 spray into the nose as needed for congestion.      . [DISCONTINUED] Calcium Carbonate-Vitamin D (CALCIUM-VITAMIN D) 500-200 MG-UNIT per tablet Take 1 tablet by mouth 2 (two) times daily with a meal.        . [DISCONTINUED] fexofenadine (ALLEGRA) 180 MG tablet Take 1 tablet (180 mg total) by mouth daily.  15 tablet  0   No current facility-administered medications on file prior to visit.    BP 108/62  Pulse 77  Temp(Src) 98.3 F (36.8 C) (Oral)  Ht 5\' 2"  (1.575 m)  Wt 200 lb (90.719 kg)  BMI 36.57 kg/m2chart      Objective:   Physical Exam  Constitutional: She is oriented to person, place, and time. She appears well-developed and well-nourished.  HENT:  Head: Normocephalic.  Right Ear: External ear normal.  Left Ear: External ear normal.  Nose: Nose normal.  Mouth/Throat: Oropharynx is clear and moist.  Eyes: Pupils are equal, round, and reactive to light.  Neck: Normal range of motion. Neck supple.  Cardiovascular: Normal rate, regular rhythm and normal heart sounds.   Pulmonary/Chest: Effort normal and breath sounds normal.  Neurological: She is alert and oriented to person, place, and time.  Skin: Skin is warm and dry.  Depo Medrol injection administered in the office.       Assessment & Plan:  Assessment 1. Allergic Rhinitis  Plan 1. Continue Claritin OTC.  2. Continue current medications.  3. Increase water intake. 4.  Contact office for questions or concerns.

## 2013-04-12 NOTE — Progress Notes (Signed)
Pre visit review using our clinic review tool, if applicable. No additional management support is needed unless otherwise documented below in the visit note. 

## 2013-04-15 ENCOUNTER — Ambulatory Visit: Payer: Medicare Other | Admitting: Family Medicine

## 2013-04-21 ENCOUNTER — Encounter: Payer: Self-pay | Admitting: Family

## 2013-04-21 ENCOUNTER — Ambulatory Visit (INDEPENDENT_AMBULATORY_CARE_PROVIDER_SITE_OTHER): Payer: Medicare Other | Admitting: Family

## 2013-04-21 VITALS — BP 130/80 | HR 104 | Temp 97.7°F | Wt 199.0 lb

## 2013-04-21 DIAGNOSIS — J309 Allergic rhinitis, unspecified: Secondary | ICD-10-CM

## 2013-04-21 DIAGNOSIS — J029 Acute pharyngitis, unspecified: Secondary | ICD-10-CM | POA: Diagnosis not present

## 2013-04-21 NOTE — Progress Notes (Signed)
Pre visit review using our clinic review tool, if applicable. No additional management support is needed unless otherwise documented below in the visit note. 

## 2013-04-21 NOTE — Patient Instructions (Signed)
Pharyngitis °Pharyngitis is redness, pain, and swelling (inflammation) of your pharynx.  °CAUSES  °Pharyngitis is usually caused by infection. Most of the time, these infections are from viruses (viral) and are part of a cold. However, sometimes pharyngitis is caused by bacteria (bacterial). Pharyngitis can also be caused by allergies. Viral pharyngitis may be spread from person to person by coughing, sneezing, and personal items or utensils (cups, forks, spoons, toothbrushes). Bacterial pharyngitis may be spread from person to person by more intimate contact, such as kissing.  °SIGNS AND SYMPTOMS  °Symptoms of pharyngitis include:   °· Sore throat.   °· Tiredness (fatigue).   °· Low-grade fever.   °· Headache. °· Joint pain and muscle aches. °· Skin rashes. °· Swollen lymph nodes. °· Plaque-like film on throat or tonsils (often seen with bacterial pharyngitis). °DIAGNOSIS  °Your health care provider will ask you questions about your illness and your symptoms. Your medical history, along with a physical exam, is often all that is needed to diagnose pharyngitis. Sometimes, a rapid strep test is done. Other lab tests may also be done, depending on the suspected cause.  °TREATMENT  °Viral pharyngitis will usually get better in 3 4 days without the use of medicine. Bacterial pharyngitis is treated with medicines that kill germs (antibiotics).  °HOME CARE INSTRUCTIONS  °· Drink enough water and fluids to keep your urine clear or pale yellow.   °· Only take over-the-counter or prescription medicines as directed by your health care provider:   °· If you are prescribed antibiotics, make sure you finish them even if you start to feel better.   °· Do not take aspirin.   °· Get lots of rest.   °· Gargle with 8 oz of salt water (½ tsp of salt per 1 qt of water) as often as every 1 2 hours to soothe your throat.   °· Throat lozenges (if you are not at risk for choking) or sprays may be used to soothe your throat. °SEEK MEDICAL  CARE IF:  °· You have large, tender lumps in your neck. °· You have a rash. °· You cough up green, yellow-brown, or bloody spit. °SEEK IMMEDIATE MEDICAL CARE IF:  °· Your neck becomes stiff. °· You drool or are unable to swallow liquids. °· You vomit or are unable to keep medicines or liquids down. °· You have severe pain that does not go away with the use of recommended medicines. °· You have trouble breathing (not caused by a stuffy nose). °MAKE SURE YOU:  °· Understand these instructions. °· Will watch your condition. °· Will get help right away if you are not doing well or get worse. °Document Released: 01/06/2005 Document Revised: 10/27/2012 Document Reviewed: 09/13/2012 °[employer]® Patient Information ©2014 [employer]. ° °

## 2013-04-25 NOTE — Progress Notes (Signed)
Subjective:    Patient ID: Linda Thompson, female    DOB: [birth year], 58 y.o.   MRN: [id number]  HPI 58 year old white female, nonsmoker and today with complaints of cough, sore throat x2 days. She has a history of allergic rhinitis and had been taken Zyrtec but discontinued the medication after getting a Depo-Medrol injection at her last office visit. Reports she was unaware that she was supposed to continue the medication. Has postnasal drip that is clear. Has a history of mild mental retardation.    Review of Systems  Constitutional: Negative.  Negative for fever.  HENT: Positive for congestion, postnasal drip, rhinorrhea and sore throat.   Respiratory: Negative.   Cardiovascular: Negative.   Gastrointestinal: Negative.   Musculoskeletal: Negative.   Skin: Negative.   Allergic/Immunologic: Negative.   Psychiatric/Behavioral: Negative.    Past Medical History  Diagnosis Date  . Allergic rhinitis, cause unspecified   . Pain in joint, ankle and foot   . Backache, unspecified     chronic LBP with radiculopathy  . Seborrheic dermatitis, unspecified   . Type II or unspecified type diabetes mellitus without mention of complication, not stated as uncontrolled   . History of fracture of arm     Right  . History of fracture of foot   . Dermatomycosis, unspecified   . Esophageal reflux   . Unspecified glaucoma   . Undiagnosed cardiac murmurs   . Diaphragmatic hernia without mention of obstruction or gangrene   . Other and unspecified hyperlipidemia   . Unspecified essential hypertension   . Irritable bowel syndrome   . Benign neoplasm of cerebral meninges   . Moderate intellectual disabilities   . Nausea alone   . Osteoarthrosis, unspecified whether generalized or localized, ankle and foot   . Pruritus ani   . Unspecified tinnitus   . Nonspecific elevation of levels of transaminase or lactic acid dehydrogenase (LDH)   . Leukorrhea, not specified as infective   . Obesity   .  Hyperglycemia   . DDD (degenerative disc disease), lumbar   . Yeast infection of the skin     Frequent    History   Social History  . Marital Status: Single    Spouse Name: N/A    Number of Children: 0  . Years of Education: N/A   Occupational History  . Disabled (from arm fracture)    Social History Main Topics  . Smoking status: Never Smoker   . Smokeless tobacco: Never Used  . Alcohol Use: No  . Drug Use: No  . Sexual Activity: Not Currently   Other Topics Concern  . Not on file   Social History Narrative   Lives alone-with help from neighbors      Currently-disability for arm fracture      [name] (neighbor) is POA    Past Surgical History  Procedure Laterality Date  . Total abdominal hysterectomy    . Cataract extraction    . Elbow surgery      right elbow    Family History  Problem Relation Age of Onset  . Diabetes Mother   . Diabetes Maternal Aunt   . Heart disease Maternal Aunt      (had pacemaker)  . Diabetes Maternal Grandmother   . Stroke Mother   . Stroke Father   . Stroke Maternal Grandmother     Allergies  Allergen Reactions  . Ace Inhibitors Cough  . Amoxicillin-Pot Clavulanate Diarrhea  . Augmentin [Amoxicillin-Pot Clavulanate] Other (See Comments)  diarrhea  . Azithromycin Diarrhea  . Ceftin [Cefuroxime Axetil]     diarrhea  . Cefuroxime Diarrhea and Other (See Comments)    Generalized weakness  . Cyclobenzaprine Hcl Diarrhea  . Flexeril [Cyclobenzaprine] Diarrhea  . Flonase [Fluticasone Propionate] Other (See Comments)    Nose bleed  . Ibuprofen Other (See Comments)    REACTION: vomiting    Current Outpatient Prescriptions on File Prior to Visit  Medication Sig Dispense Refill  . aspirin EC 81 MG tablet Take 81 mg by mouth daily.      Marland Kitchen desonide (DESOWEN) 0.05 % ointment Apply 1 application topically 2 (two) times daily.      Marland Kitchen diltiazem (TIAZAC) 240 MG 24 hr capsule Take 240 mg by mouth daily.      .  hydrochlorothiazide (MICROZIDE) 12.5 MG capsule Take 1 capsule (12.5 mg total) by mouth daily.  90 capsule  1  . ipratropium (ATROVENT) 0.06 % nasal spray Place 2 sprays into both nostrils 4 (four) times daily.  15 mL  1  . ketoconazole (NIZORAL) 2 % shampoo Apply 1 application topically 2 (two) times a week.  120 mL  3  . latanoprost (XALATAN) 0.005 % ophthalmic solution Place 1 drop into both eyes at bedtime.       Marland Kitchen loperamide (IMODIUM) 1 MG/5ML solution Take 5 mLs (1 mg total) by mouth 4 (four) times daily as needed for diarrhea or loose stools.  120 mL  0  . metFORMIN (GLUCOPHAGE) 500 MG tablet Take 500 mg by mouth 2 (two) times daily with a meal.       . nystatin (MYCOSTATIN/NYSTOP) 100000 UNIT/GM POWD Apply to affected areas with yeast three times daily as needed  2 Bottle  3  . omeprazole (PRILOSEC) 20 MG capsule Take 1 capsule (20 mg total) by mouth 2 (two) times daily.  180 capsule  1  . oxyCODONE (OXY IR/ROXICODONE) 5 MG immediate release tablet Take 5-10 mg by mouth every 4 (four) hours as needed for severe pain.      . potassium chloride SA (K-DUR,KLOR-CON) 20 MEQ tablet Take 1 tablet (20 mEq total) by mouth daily.  90 tablet  3  . pravastatin (PRAVACHOL) 40 MG tablet Take 1 tablet (40 mg total) by mouth every evening.  90 tablet  3  . Propylene Glycol (SYSTANE BALANCE OP) Apply 2 drops to eye 2 (two) times daily as needed (dry eyes).       . sodium chloride (OCEAN) 0.65 % nasal spray Place 1 spray into the nose as needed for congestion.      . [DISCONTINUED] Calcium Carbonate-Vitamin D (CALCIUM-VITAMIN D) 500-200 MG-UNIT per tablet Take 1 tablet by mouth 2 (two) times daily with a meal.        . [DISCONTINUED] fexofenadine (ALLEGRA) 180 MG tablet Take 1 tablet (180 mg total) by mouth daily.  15 tablet  0   No current facility-administered medications on file prior to visit.    BP 130/80  Pulse 104  Temp(Src) 97.7 F (36.5 C) (Oral)  Wt 199 lb (90.266 kg)chart    Objective:    Physical Exam  Constitutional: She is oriented to person, place, and time. She appears well-developed and well-nourished.  HENT:  Right Ear: External ear normal.  Left Ear: External ear normal.  Nose: Nose normal.  Mouth/Throat: Oropharynx is clear and moist.  Neck: Normal range of motion. Neck supple.  Cardiovascular: Normal rate, regular rhythm and normal heart sounds.   Pulmonary/Chest: Effort normal and  breath sounds normal.  Abdominal: Soft. Bowel sounds are normal.  Musculoskeletal: Normal range of motion.  Neurological: She is alert and oriented to person, place, and time.  Skin: Skin is warm and dry.  Psychiatric: She has a normal mood and affect.          Assessment & Plan:  Verenis was seen today for no specified reason.  Diagnoses and associated orders for this visit:  Allergic rhinitis  Acute pharyngitis   Resume Zyrtec once daily. Continue throughout the spring season.

## 2013-04-26 ENCOUNTER — Ambulatory Visit (INDEPENDENT_AMBULATORY_CARE_PROVIDER_SITE_OTHER): Payer: Medicare Other

## 2013-04-26 VITALS — BP 128/79 | HR 94 | Resp 15 | Ht 62.0 in | Wt 200.0 lb

## 2013-04-26 DIAGNOSIS — E1142 Type 2 diabetes mellitus with diabetic polyneuropathy: Secondary | ICD-10-CM | POA: Diagnosis not present

## 2013-04-26 DIAGNOSIS — Q828 Other specified congenital malformations of skin: Secondary | ICD-10-CM

## 2013-04-26 DIAGNOSIS — E1149 Type 2 diabetes mellitus with other diabetic neurological complication: Secondary | ICD-10-CM | POA: Diagnosis not present

## 2013-04-26 DIAGNOSIS — L608 Other nail disorders: Secondary | ICD-10-CM

## 2013-04-26 DIAGNOSIS — E1140 Type 2 diabetes mellitus with diabetic neuropathy, unspecified: Secondary | ICD-10-CM

## 2013-04-26 NOTE — Patient Instructions (Signed)
Diabetes and Foot Care Diabetes may cause you to have problems because of poor blood supply (circulation) to your feet and legs. This may cause the skin on your feet to become thinner, break easier, and heal more slowly. Your skin may become dry, and the skin may peel and crack. You may also have nerve damage in your legs and feet causing decreased feeling in them. You may not notice minor injuries to your feet that could lead to infections or more serious problems. Taking care of your feet is one of the most important things you can do for yourself.  HOME CARE INSTRUCTIONS  Wear shoes at all times, even in the house. Do not go barefoot. Bare feet are easily injured.  Check your feet daily for blisters, cuts, and redness. If you cannot see the bottom of your feet, use a mirror or ask someone for help.  Wash your feet with warm water (do not use hot water) and mild soap. Then pat your feet and the areas between your toes until they are completely dry. Do not soak your feet as this can dry your skin.  Apply a moisturizing lotion or petroleum jelly (that does not contain alcohol and is unscented) to the skin on your feet and to dry, brittle toenails. Do not apply lotion between your toes.  Trim your toenails straight across. Do not dig under them or around the cuticle. File the edges of your nails with an emery board or nail file.  Do not cut corns or calluses or try to remove them with medicine.  Wear clean socks or stockings every day. Make sure they are not too tight. Do not wear knee-high stockings since they may decrease blood flow to your legs.  Wear shoes that fit properly and have enough cushioning. To break in new shoes, wear them for just a few hours a day. This prevents you from injuring your feet. Always look in your shoes before you put them on to be sure there are no objects inside.  Do not cross your legs. This may decrease the blood flow to your feet.  If you find a minor scrape,  cut, or break in the skin on your feet, keep it and the skin around it clean and dry. These areas may be cleansed with mild soap and water. Do not cleanse the area with peroxide, alcohol, or iodine.  When you remove an adhesive bandage, be sure not to damage the skin around it.  If you have a wound, look at it several times a day to make sure it is healing.  Do not use heating pads or hot water bottles. They may burn your skin. If you have lost feeling in your feet or legs, you may not know it is happening until it is too late.  Make sure your health care provider performs a complete foot exam at least annually or more often if you have foot problems. Report any cuts, sores, or bruises to your health care provider immediately. SEEK MEDICAL CARE IF:   You have an injury that is not healing.  You have cuts or breaks in the skin.  You have an ingrown nail.  You notice redness on your legs or feet.  You feel burning or tingling in your legs or feet.  You have pain or cramps in your legs and feet.  Your legs or feet are numb.  Your feet always feel cold. SEEK IMMEDIATE MEDICAL CARE IF:   There is increasing redness,   swelling, or pain in or around a wound.  There is a red line that goes up your leg.  Pus is coming from a wound.  You develop a fever or as directed by your health care provider.  You notice a bad smell coming from an ulcer or wound. Document Released: 01/04/2000 Document Revised: 09/08/2012 Document Reviewed: 06/15/2012 [facility] Patient Information 2014 [place].   Apply lotion to the once or twice daily as instructed.  Continue to apply topical nail antifungal once or twice a month as a preventative as instructed

## 2013-04-26 NOTE — Progress Notes (Signed)
   Subjective:    Patient ID: Linda Thompson, female    DOB: 08-18-1955, 58 y.o.   MRN: [id number] Pt presents for debridement of toenails.  Pt asked to check status of Diabetic shoe. HPI    Review of Systems no new changes or findings at this time.     Objective:   Physical Exam Neurovascular status is intact and unchanged pedal pulses DP postal for PT plus one over 4 bilateral Refill time 3 seconds all digits epicritic and proprioceptive sensations intact and symmetric there is decreased sensation Semmes Weinstein testing to distal digits and plantar arch orthopedic biomechanical exam rectus foot type ankle mid tarsus subtalar joint motions normal dermatologically nails thick brittle criptotic improving patient is a topical antifungal we'll use once monthly as a preventative at this time nails criptotic incurvated and slightly brittle orthopedic biomechanical exam unremarkable noncontributory rectus foot type ankle mid tarsus subtalar joint motions normal patient is having your Dr. Layne Benton and Dr. Megan Salon for diabetes and overall care no other new changes does have history peripheral neuropathy      Assessment & Plan:  Assessment diabetes with peripheral neuropathy and mild complications dystrophic nails debrided x10 the presence of diabetes and complications return for future palliative nail care or 3 months maintain lotion for dry skin a daily basis as instructed will continue with topical nail antifungal once a month as preventative next  Harriet Masson DPM

## 2013-04-28 ENCOUNTER — Ambulatory Visit (INDEPENDENT_AMBULATORY_CARE_PROVIDER_SITE_OTHER): Payer: Medicare Other | Admitting: Family Medicine

## 2013-04-28 ENCOUNTER — Encounter: Payer: Self-pay | Admitting: Family Medicine

## 2013-04-28 VITALS — BP 124/84 | HR 101 | Temp 97.5°F | Wt 200.0 lb

## 2013-04-28 DIAGNOSIS — R21 Rash and other nonspecific skin eruption: Secondary | ICD-10-CM

## 2013-04-28 NOTE — Progress Notes (Signed)
Chief Complaint  Patient presents with  . Breast Problem    HPI:  Acute visit for yeast rash under breasts: -reports prior PCP referred her to Dr. Ouida Sills [place] for this, but hasn't followed up there in a few months -uses cream sometimes but doesn't know what -denies: fevers, chills, pain, oozing, mass  ROS: See pertinent positives and negatives per HPI.  Past Medical History  Diagnosis Date  . Allergic rhinitis, cause unspecified   . Pain in joint, ankle and foot   . Backache, unspecified     chronic LBP with radiculopathy  . Seborrheic dermatitis, unspecified   . Type II or unspecified type diabetes mellitus without mention of complication, not stated as uncontrolled   . History of fracture of arm     Right  . History of fracture of foot   . Dermatomycosis, unspecified   . Esophageal reflux   . Unspecified glaucoma   . Undiagnosed cardiac murmurs   . Diaphragmatic hernia without mention of obstruction or gangrene   . Other and unspecified hyperlipidemia   . Unspecified essential hypertension   . Irritable bowel syndrome   . Benign neoplasm of cerebral meninges   . Moderate intellectual disabilities   . Nausea alone   . Osteoarthrosis, unspecified whether generalized or localized, ankle and foot   . Pruritus ani   . Unspecified tinnitus   . Nonspecific elevation of levels of transaminase or lactic acid dehydrogenase (LDH)   . Leukorrhea, not specified as infective   . Obesity   . Hyperglycemia   . DDD (degenerative disc disease), lumbar   . Yeast infection of the skin     Frequent    Past Surgical History  Procedure Laterality Date  . Total abdominal hysterectomy    . Cataract extraction    . Elbow surgery      right elbow  . Lipoma excision Left [id number]    posterior left axilla    Family History  Problem Relation Age of Onset  . Diabetes Mother   . Diabetes Maternal Aunt   . Heart disease Maternal Aunt      (had pacemaker)  . Diabetes  Maternal Grandmother   . Stroke Mother   . Stroke Father   . Stroke Maternal Grandmother     History   Social History  . Marital Status: Single    Spouse Name: N/A    Number of Children: 0  . Years of Education: N/A   Occupational History  . Disabled (from arm fracture)    Social History Main Topics  . Smoking status: Never Smoker   . Smokeless tobacco: Never Used  . Alcohol Use: No  . Drug Use: No  . Sexual Activity: Not Currently   Other Topics Concern  . None   Social History Narrative   Lives alone-with help from neighbors      Currently-disability for arm fracture      [name] (neighbor) is POA    Current outpatient prescriptions:aspirin EC 81 MG tablet, Take 81 mg by mouth daily., Disp: , Rfl: ;  desonide (DESOWEN) 0.05 % ointment, Apply 1 application topically 2 (two) times daily., Disp: , Rfl: ;  diltiazem (TIAZAC) 240 MG 24 hr capsule, Take 240 mg by mouth daily., Disp: , Rfl: ;  hydrochlorothiazide (MICROZIDE) 12.5 MG capsule, Take 1 capsule (12.5 mg total) by mouth daily., Disp: 90 capsule, Rfl: 1 ipratropium (ATROVENT) 0.06 % nasal spray, Place 2 sprays into both nostrils 4 (four) times daily., Disp:  15 mL, Rfl: 1;  ketoconazole (NIZORAL) 2 % shampoo, Apply 1 application topically 2 (two) times a week., Disp: 120 mL, Rfl: 3;  latanoprost (XALATAN) 0.005 % ophthalmic solution, Place 1 drop into both eyes at bedtime. , Disp: , Rfl:  loperamide (IMODIUM) 1 MG/5ML solution, Take 5 mLs (1 mg total) by mouth 4 (four) times daily as needed for diarrhea or loose stools., Disp: 120 mL, Rfl: 0;  metFORMIN (GLUCOPHAGE) 500 MG tablet, Take 500 mg by mouth 2 (two) times daily with a meal. , Disp: , Rfl: ;  nystatin (MYCOSTATIN/NYSTOP) 100000 UNIT/GM POWD, Apply to affected areas with yeast three times daily as needed, Disp: 2 Bottle, Rfl: 3 omeprazole (PRILOSEC) 20 MG capsule, Take 1 capsule (20 mg total) by mouth 2 (two) times daily., Disp: 180 capsule, Rfl: 1;  oxyCODONE  (OXY IR/ROXICODONE) 5 MG immediate release tablet, Take 5-10 mg by mouth every 4 (four) hours as needed for severe pain., Disp: , Rfl: ;  potassium chloride SA (K-DUR,KLOR-CON) 20 MEQ tablet, Take 1 tablet (20 mEq total) by mouth daily., Disp: 90 tablet, Rfl: 3 pravastatin (PRAVACHOL) 40 MG tablet, Take 1 tablet (40 mg total) by mouth every evening., Disp: 90 tablet, Rfl: 3;  Propylene Glycol (SYSTANE BALANCE OP), Apply 2 drops to eye 2 (two) times daily as needed (dry eyes). , Disp: , Rfl: ;  sodium chloride (OCEAN) 0.65 % nasal spray, Place 1 spray into the nose as needed for congestion., Disp: , Rfl:  [DISCONTINUED] Calcium Carbonate-Vitamin D (CALCIUM-VITAMIN D) 500-200 MG-UNIT per tablet, Take 1 tablet by mouth 2 (two) times daily with a meal.  , Disp: , Rfl: ;  [DISCONTINUED] fexofenadine (ALLEGRA) 180 MG tablet, Take 1 tablet (180 mg total) by mouth daily., Disp: 15 tablet, Rfl: 0  EXAM:  Filed Vitals:   04/28/13 1107  BP: 124/84  Pulse: 101  Temp: 97.5 F (36.4 C)    Body mass index is 36.57 kg/(m^2).  GENERAL: vitals reviewed and listed above, alert, oriented, appears well hydrated and in no acute distress  HEENT: atraumatic, conjunttiva clear, no obvious abnormalities on inspection of external nose and ears  NECK: no obvious masses on inspection  SKIN: erythematous, finely scaly rash under L breast, no mass or pain   MS: moves all extremities without noticeable abnormality  PSYCH: pleasant and cooperative, no obvious depression or anxiety  ASSESSMENT AND PLAN:  Discussed the following assessment and plan:  Rash and nonspecific skin eruption  -Patient advised to return or notify a doctor immediately if symptoms worsen or persist or new concerns arise.  Patient Instructions  -schedule follow up with your dermatolgist  -monistat or clotrimazole 1% cream (can get at [facility]) to area twice daily until you see your dermatologist  -schedule your yearly  mammogram  -follow up with your doctor if persists or new concerns     [name]

## 2013-04-28 NOTE — Progress Notes (Signed)
Pre visit review using our clinic review tool, if applicable. No additional management support is needed unless otherwise documented below in the visit note. 

## 2013-04-28 NOTE — Patient Instructions (Signed)
-  schedule follow up with your dermatolgist  -monistat or clotrimazole 1% cream (can get at [facility]) to area twice daily until you see your dermatologist  -schedule your yearly mammogram  -follow up with your doctor if persists or new concerns

## 2013-05-11 ENCOUNTER — Telehealth: Payer: Self-pay | Admitting: Family

## 2013-05-11 MED ORDER — OMEPRAZOLE 20 MG PO CPDR: 20.0000 mg | DELAYED_RELEASE_CAPSULE | Freq: Two times a day (BID) | ORAL | Status: DC

## 2013-05-11 MED ORDER — METFORMIN HCL 500 MG PO TABS: 500.0000 mg | ORAL_TABLET | Freq: Two times a day (BID) | ORAL | Status: DC

## 2013-05-11 MED ORDER — HYDROCHLOROTHIAZIDE 12.5 MG PO CAPS: 12.5000 mg | ORAL_CAPSULE | Freq: Every day | ORAL | Status: DC

## 2013-05-11 NOTE — Telephone Encounter (Signed)
done

## 2013-05-11 NOTE — Telephone Encounter (Signed)
Patient came in to change her pharmacy to [facility] [place] [phone]. She will so need a refill on her omeprazole (PRILOSEC) 20 MG capsule, hydrochlorothiazide (MICROZIDE) 12.5 MG capsule, and her metFORMIN (GLUCOPHAGE) 500 MG tablet. Her contact number has been verified.

## 2013-05-12 ENCOUNTER — Ambulatory Visit (INDEPENDENT_AMBULATORY_CARE_PROVIDER_SITE_OTHER): Payer: Medicare Other | Admitting: Family

## 2013-05-12 ENCOUNTER — Encounter: Payer: Self-pay | Admitting: Family

## 2013-05-12 VITALS — BP 128/82 | HR 102 | Temp 97.7°F | Ht 62.0 in | Wt 202.0 lb

## 2013-05-12 DIAGNOSIS — J309 Allergic rhinitis, unspecified: Secondary | ICD-10-CM

## 2013-05-12 NOTE — Patient Instructions (Signed)
Allergic Rhinitis Allergic rhinitis is when the mucous membranes in the nose respond to allergens. Allergens are particles in the air that cause your body to have an allergic reaction. This causes you to release allergic antibodies. Through a chain of events, these eventually cause you to release histamine into the blood stream. Although meant to protect the body, it is this release of histamine that causes your discomfort, such as frequent sneezing, congestion, and an itchy, runny nose.  CAUSES  Seasonal allergic rhinitis (hay fever) is caused by pollen allergens that may come from grasses, trees, and weeds. Year-round allergic rhinitis (perennial allergic rhinitis) is caused by allergens such as house dust mites, pet dander, and mold spores.  SYMPTOMS   Nasal stuffiness (congestion).  Itchy, runny nose with sneezing and tearing of the eyes. DIAGNOSIS  Your health care provider can help you determine the allergen or allergens that trigger your symptoms. If you and your health care provider are unable to determine the allergen, skin or blood testing may be used. TREATMENT  Allergic Rhinitis does not have a cure, but it can be controlled by:  Medicines and allergy shots (immunotherapy).  Avoiding the allergen. Hay fever may often be treated with antihistamines in pill or nasal spray forms. Antihistamines block the effects of histamine. There are over-the-counter medicines that may help with nasal congestion and swelling around the eyes. Check with your health care provider before taking or giving this medicine.  If avoiding the allergen or the medicine prescribed do not work, there are many new medicines your health care provider can prescribe. Stronger medicine may be used if initial measures are ineffective. Desensitizing injections can be used if medicine and avoidance does not work. Desensitization is when a patient is given ongoing shots until the body becomes less sensitive to the allergen.  Make sure you follow up with your health care provider if problems continue. HOME CARE INSTRUCTIONS It is not possible to completely avoid allergens, but you can reduce your symptoms by taking steps to limit your exposure to them. It helps to know exactly what you are allergic to so that you can avoid your specific triggers. SEEK MEDICAL CARE IF:   You have a fever.  You develop a cough that does not stop easily (persistent).  You have shortness of breath.  You start wheezing.  Symptoms interfere with normal daily activities. Document Released: 10/01/2000 Document Revised: 10/27/2012 Document Reviewed: 09/13/2012 Exit[employer] Patient Information 2014 [employer].  

## 2013-05-12 NOTE — Progress Notes (Signed)
Pre visit review using our clinic review tool, if applicable. No additional management support is needed unless otherwise documented below in the visit note. 

## 2013-05-12 NOTE — Progress Notes (Signed)
Subjective:    Patient ID: Linda Thompson, female    DOB: Jun 15, 1955, 58 y.o.   MRN: [id number]  HPI 58 year old white female, nonsmoker is has had a complaints of sinus pressure that developed last night. She's used nasal saline to help her symptoms. Denies any sneezing, cough or congestion. Takes Zyrtec daily for allergic rhinitis. Denies any fever or chills.   Review of Systems  Constitutional: Negative.   HENT: Positive for sinus pressure. Negative for congestion, rhinorrhea and tinnitus.   Eyes: Negative.  Negative for redness.  Respiratory: Negative.  Negative for cough.   Cardiovascular: Negative.   Skin: Negative.   Allergic/Immunologic: Positive for environmental allergies.  Psychiatric/Behavioral: Negative.    Past Medical History  Diagnosis Date  . Allergic rhinitis, cause unspecified   . Pain in joint, ankle and foot   . Backache, unspecified     chronic LBP with radiculopathy  . Seborrheic dermatitis, unspecified   . Type II or unspecified type diabetes mellitus without mention of complication, not stated as uncontrolled   . History of fracture of arm     Right  . History of fracture of foot   . Dermatomycosis, unspecified   . Esophageal reflux   . Unspecified glaucoma   . Undiagnosed cardiac murmurs   . Diaphragmatic hernia without mention of obstruction or gangrene   . Other and unspecified hyperlipidemia   . Unspecified essential hypertension   . Irritable bowel syndrome   . Benign neoplasm of cerebral meninges   . Moderate intellectual disabilities   . Nausea alone   . Osteoarthrosis, unspecified whether generalized or localized, ankle and foot   . Pruritus ani   . Unspecified tinnitus   . Nonspecific elevation of levels of transaminase or lactic acid dehydrogenase (LDH)   . Leukorrhea, not specified as infective   . Obesity   . Hyperglycemia   . DDD (degenerative disc disease), lumbar   . Yeast infection of the skin     Frequent    History    Social History  . Marital Status: Single    Spouse Name: N/A    Number of Children: 0  . Years of Education: N/A   Occupational History  . Disabled (from arm fracture)    Social History Main Topics  . Smoking status: Never Smoker   . Smokeless tobacco: Never Used  . Alcohol Use: No  . Drug Use: No  . Sexual Activity: Not Currently   Other Topics Concern  . Not on file   Social History Narrative   Lives alone-with help from neighbors      Currently-disability for arm fracture      [name] (neighbor) is POA    Past Surgical History  Procedure Laterality Date  . Total abdominal hysterectomy    . Cataract extraction    . Elbow surgery      right elbow  . Lipoma excision Left [id number]    posterior left axilla    Family History  Problem Relation Age of Onset  . Diabetes Mother   . Diabetes Maternal Aunt   . Heart disease Maternal Aunt      (had pacemaker)  . Diabetes Maternal Grandmother   . Stroke Mother   . Stroke Father   . Stroke Maternal Grandmother     Allergies  Allergen Reactions  . Ace Inhibitors Cough  . Amoxicillin-Pot Clavulanate Diarrhea  . Augmentin [Amoxicillin-Pot Clavulanate] Other (See Comments)    diarrhea  . Azithromycin Diarrhea  .  Ceftin [Cefuroxime Axetil]     diarrhea  . Cefuroxime Diarrhea and Other (See Comments)    Generalized weakness  . Cyclobenzaprine Hcl Diarrhea  . Flexeril [Cyclobenzaprine] Diarrhea  . Flonase [Fluticasone Propionate] Other (See Comments)    Nose bleed  . Ibuprofen Other (See Comments)    REACTION: vomiting    Current Outpatient Prescriptions on File Prior to Visit  Medication Sig Dispense Refill  . aspirin EC 81 MG tablet Take 81 mg by mouth daily.      Marland Kitchen desonide (DESOWEN) 0.05 % ointment Apply 1 application topically 2 (two) times daily.      Marland Kitchen diltiazem (TIAZAC) 240 MG 24 hr capsule Take 240 mg by mouth daily.      . hydrochlorothiazide (MICROZIDE) 12.5 MG capsule Take 1 capsule (12.5 mg  total) by mouth daily.  90 capsule  0  . ipratropium (ATROVENT) 0.06 % nasal spray Place 2 sprays into both nostrils 4 (four) times daily.  15 mL  1  . ketoconazole (NIZORAL) 2 % shampoo Apply 1 application topically 2 (two) times a week.  120 mL  3  . latanoprost (XALATAN) 0.005 % ophthalmic solution Place 1 drop into both eyes at bedtime.       Marland Kitchen loperamide (IMODIUM) 1 MG/5ML solution Take 5 mLs (1 mg total) by mouth 4 (four) times daily as needed for diarrhea or loose stools.  120 mL  0  . metFORMIN (GLUCOPHAGE) 500 MG tablet Take 1 tablet (500 mg total) by mouth 2 (two) times daily with a meal.  180 tablet  0  . nystatin (MYCOSTATIN/NYSTOP) 100000 UNIT/GM POWD Apply to affected areas with yeast three times daily as needed  2 Bottle  3  . omeprazole (PRILOSEC) 20 MG capsule Take 1 capsule (20 mg total) by mouth 2 (two) times daily.  180 capsule  0  . oxyCODONE (OXY IR/ROXICODONE) 5 MG immediate release tablet Take 5-10 mg by mouth every 4 (four) hours as needed for severe pain.      . potassium chloride SA (K-DUR,KLOR-CON) 20 MEQ tablet Take 1 tablet (20 mEq total) by mouth daily.  90 tablet  3  . pravastatin (PRAVACHOL) 40 MG tablet Take 1 tablet (40 mg total) by mouth every evening.  90 tablet  3  . Propylene Glycol (SYSTANE BALANCE OP) Apply 2 drops to eye 2 (two) times daily as needed (dry eyes).       . sodium chloride (OCEAN) 0.65 % nasal spray Place 1 spray into the nose as needed for congestion.      . [DISCONTINUED] Calcium Carbonate-Vitamin D (CALCIUM-VITAMIN D) 500-200 MG-UNIT per tablet Take 1 tablet by mouth 2 (two) times daily with a meal.        . [DISCONTINUED] fexofenadine (ALLEGRA) 180 MG tablet Take 1 tablet (180 mg total) by mouth daily.  15 tablet  0   No current facility-administered medications on file prior to visit.    BP 128/82  Pulse 102  Temp(Src) 97.7 F (36.5 C) (Oral)  Ht 5\' 2"  (1.575 m)  Wt 202 lb (91.627 kg)  BMI 36.94 kg/m2  SpO2 98%chart    Objective:    Physical Exam  Constitutional: She is oriented to person, place, and time. She appears well-developed and well-nourished.  HENT:  Right Ear: External ear normal.  Left Ear: External ear normal.  Nose: Nose normal.  Mouth/Throat: Oropharynx is clear and moist.  Neck: Normal range of motion. Neck supple.  Cardiovascular: Normal rate, regular rhythm and  normal heart sounds.   Pulmonary/Chest: Effort normal and breath sounds normal.  Musculoskeletal: Normal range of motion.  Neurological: She is alert and oriented to person, place, and time.  Skin: Skin is warm and dry.  Psychiatric: She has a normal mood and affect.          Assessment & Plan:  Assessment: 1. Allergic rhinitis  Plan: Continue Zyrtec. Continue nasal saline. Call the office with any questions or concerns. Recheck as scheduled in Center as needed.

## 2013-05-24 ENCOUNTER — Telehealth: Payer: Self-pay | Admitting: Family

## 2013-05-24 NOTE — Telephone Encounter (Signed)
Patient Information:  Caller Name: Madiline  Phone: [phone]  Patient: Linda Thompson, Linda Thompson  Gender: Female  DOB: [birth year]  Age: 59 Years  PCP: Roxy Cedar [facility])  Office Follow Up:  Does the office need to follow up with this patient?: No  Instructions For The Office: N/A   Symptoms  Reason For Call & Symptoms: Pt is calling for an appt for right hip pain she has had x 3 days. No UTI sx. NO fever. No injury.  Reviewed Health History In EMR: Yes  Reviewed Medications In EMR: Yes  Reviewed Allergies In EMR: Yes  Reviewed Surgeries / Procedures: Yes  Date of Onset of Symptoms: 05/21/2013  Guideline(s) Used:  Leg Pain  Disposition Per Guideline:   See Within 3 Days in Office  Reason For Disposition Reached:   Moderate pain (e.g., interferes with normal activities, limping) and present > 3 days  Advice Given:  N/A  Patient Will Follow Care Advice:  YES  Appointment Scheduled:  05/25/2013 09:15:00 Appointment Scheduled Provider:  Roxy Cedar [facility])

## 2013-05-24 NOTE — Telephone Encounter (Signed)
Noted  

## 2013-05-25 ENCOUNTER — Encounter: Payer: Self-pay | Admitting: Family

## 2013-05-25 ENCOUNTER — Other Ambulatory Visit: Payer: Self-pay | Admitting: Family

## 2013-05-25 ENCOUNTER — Telehealth: Payer: Self-pay | Admitting: Family

## 2013-05-25 ENCOUNTER — Ambulatory Visit (INDEPENDENT_AMBULATORY_CARE_PROVIDER_SITE_OTHER): Payer: Medicare Other | Admitting: Family

## 2013-05-25 VITALS — BP 120/84 | HR 95 | Temp 97.9°F | Ht 62.0 in | Wt 207.0 lb

## 2013-05-25 DIAGNOSIS — M1990 Unspecified osteoarthritis, unspecified site: Secondary | ICD-10-CM | POA: Diagnosis not present

## 2013-05-25 DIAGNOSIS — M25559 Pain in unspecified hip: Secondary | ICD-10-CM

## 2013-05-25 DIAGNOSIS — M25551 Pain in right hip: Secondary | ICD-10-CM

## 2013-05-25 NOTE — Telephone Encounter (Signed)
Per Abby Potash, take aleve twice a day with food as discussed in OV  Left message to advise pt

## 2013-05-25 NOTE — Patient Instructions (Addendum)
1. TAKE ALEVE TWICE A DAY WITH FOOD FOR HIP PAIN.   Osteoarthritis Osteoarthritis is a disease that causes soreness and swelling (inflammation) of a joint. It occurs when the cartilage at the affected joint wears down. Cartilage acts as a cushion, covering the ends of bones where they meet to form a joint. Osteoarthritis is the most common form of arthritis. It often occurs in older people. The joints affected most often by this condition include those in the:  Ends of the fingers.  Thumbs.  Neck.  Lower back.  Knees.  Hips. CAUSES  Over time, the cartilage that covers the ends of bones begins to wear away. This causes bone to rub on bone, producing pain and stiffness in the affected joints.  RISK FACTORS Certain factors can increase your chances of having osteoarthritis, including:  Older age.  Excessive body weight.  Overuse of joints. SIGNS AND SYMPTOMS   Pain, swelling, and stiffness in the joint.  Over time, the joint may lose its normal shape.  Small deposits of bone (osteophytes) may grow on the edges of the joint.  Bits of bone or cartilage can break off and float inside the joint space. This may cause more pain and damage. DIAGNOSIS  Your health care provider will do a physical exam and ask about your symptoms. Various tests may be ordered, such as:  X-rays of the affected joint.  An MRI scan.  Blood tests to rule out other types of arthritis.  Joint fluid tests. This involves using a needle to draw fluid from the joint and examining the fluid under a microscope. TREATMENT  Goals of treatment are to control pain and improve joint function. Treatment plans may include:  A prescribed exercise program that allows for rest and joint relief.  A weight control plan.  Pain relief techniques, such as:  Properly applied heat and cold.  Electric pulses delivered to nerve endings under the skin (transcutaneous electrical nerve stimulation,  TENS).  Massage.  Certain nutritional supplements.  Medicines to control pain, such as:  Acetaminophen.  Nonsteroidal anti-inflammatory drugs (NSAIDs), such as naproxen.  Narcotic or central-acting agents, such as tramadol.  Corticosteroids. These can be given orally or as an injection.  Surgery to reposition the bones and relieve pain (osteotomy) or to remove loose pieces of bone and cartilage. Joint replacement may be needed in advanced states of osteoarthritis. HOME CARE INSTRUCTIONS   Only take over-the-counter or prescription medicines as directed by your health care provider. Take all medicines exactly as instructed.  Maintain a healthy weight. Follow your health care provider's instructions for weight control. This may include dietary instructions.  Exercise as directed. Your health care provider can recommend specific types of exercise. These may include:  Strengthening exercises These are done to strengthen the muscles that support joints affected by arthritis. They can be performed with weights or with exercise bands to add resistance.  Aerobic activities These are exercises, such as brisk walking or low-impact aerobics, that get your heart pumping.  Range-of-motion activities These keep your joints limber.  Balance and agility exercises These help you maintain daily living skills.  Rest your affected joints as directed by your health care provider.  Follow up with your health care provider as directed. SEEK MEDICAL CARE IF:   Your skin turns red.  You develop a rash in addition to your joint pain.  You have worsening joint pain. SEEK IMMEDIATE MEDICAL CARE IF:  You have a significant loss of weight or appetite.  You have a fever along with joint or muscle aches.  You have night sweats. [place] of Arthritis and Musculoskeletal and Skin Diseases: www.niams.SouthExposed.es [employer] on Aging: http://kim-miller.com/ American  College of Rheumatology: www.rheumatology.org Document Released: 01/06/2005 Document Revised: 10/27/2012 Document Reviewed: 09/13/2012 [facility] Patient Information 2014 [place], [place].

## 2013-05-25 NOTE — Progress Notes (Signed)
Subjective:    Patient ID: Linda Thompson, female    DOB: [birth year], 58 y.o.   MRN: [id number]  HPI  58 year old female, nonsmoker is in today with c/o right hip pain x 2 days. Has not been taking any medication for relief. Has Tramadol given to her from orthopedics that helps. Pain 2/10, worse with bending forward. Has a known history of osteoarthritis.   Review of Systems  Constitutional: Negative.   Respiratory: Negative.   Cardiovascular: Negative.   Genitourinary: Negative.   Musculoskeletal: Positive for arthralgias.       Right hip pain   Skin: Negative.   Allergic/Immunologic: Negative.   Neurological: Negative.   Psychiatric/Behavioral: Negative.    Past Medical History  Diagnosis Date  . Allergic rhinitis, cause unspecified   . Pain in joint, ankle and foot   . Backache, unspecified     chronic LBP with radiculopathy  . Seborrheic dermatitis, unspecified   . Type II or unspecified type diabetes mellitus without mention of complication, not stated as uncontrolled   . History of fracture of arm     Right  . History of fracture of foot   . Dermatomycosis, unspecified   . Esophageal reflux   . Unspecified glaucoma   . Undiagnosed cardiac murmurs   . Diaphragmatic hernia without mention of obstruction or gangrene   . Other and unspecified hyperlipidemia   . Unspecified essential hypertension   . Irritable bowel syndrome   . Benign neoplasm of cerebral meninges   . Moderate intellectual disabilities   . Nausea alone   . Osteoarthrosis, unspecified whether generalized or localized, ankle and foot   . Pruritus ani   . Unspecified tinnitus   . Nonspecific elevation of levels of transaminase or lactic acid dehydrogenase (LDH)   . Leukorrhea, not specified as infective   . Obesity   . Hyperglycemia   . DDD (degenerative disc disease), lumbar   . Yeast infection of the skin     Frequent    History   Social History  . Marital Status: Single    Spouse Name:  N/A    Number of Children: 0  . Years of Education: N/A   Occupational History  . Disabled (from arm fracture)    Social History Main Topics  . Smoking status: Never Smoker   . Smokeless tobacco: Never Used  . Alcohol Use: No  . Drug Use: No  . Sexual Activity: Not Currently   Other Topics Concern  . Not on file   Social History Narrative   Lives alone-with help from neighbors      Currently-disability for arm fracture      [name] (neighbor) is POA    Past Surgical History  Procedure Laterality Date  . Total abdominal hysterectomy    . Cataract extraction    . Elbow surgery      right elbow  . Lipoma excision Left [id number]    posterior left axilla    Family History  Problem Relation Age of Onset  . Diabetes Mother   . Diabetes Maternal Aunt   . Heart disease Maternal Aunt      (had pacemaker)  . Diabetes Maternal Grandmother   . Stroke Mother   . Stroke Father   . Stroke Maternal Grandmother     Allergies  Allergen Reactions  . Ace Inhibitors Cough  . Amoxicillin-Pot Clavulanate Diarrhea  . Augmentin [Amoxicillin-Pot Clavulanate] Other (See Comments)    diarrhea  . Azithromycin Diarrhea  .  Ceftin [Cefuroxime Axetil]     diarrhea  . Cefuroxime Diarrhea and Other (See Comments)    Generalized weakness  . Cyclobenzaprine Hcl Diarrhea  . Flexeril [Cyclobenzaprine] Diarrhea  . Flonase [Fluticasone Propionate] Other (See Comments)    Nose bleed  . Ibuprofen Other (See Comments)    REACTION: vomiting    Current Outpatient Prescriptions on File Prior to Visit  Medication Sig Dispense Refill  . aspirin EC 81 MG tablet Take 81 mg by mouth daily.      Marland Kitchen desonide (DESOWEN) 0.05 % ointment Apply 1 application topically 2 (two) times daily.      Marland Kitchen diltiazem (TIAZAC) 240 MG 24 hr capsule Take 240 mg by mouth daily.      . hydrochlorothiazide (MICROZIDE) 12.5 MG capsule Take 1 capsule (12.5 mg total) by mouth daily.  90 capsule  0  . ipratropium (ATROVENT)  0.06 % nasal spray Place 2 sprays into both nostrils 4 (four) times daily.  15 mL  1  . ketoconazole (NIZORAL) 2 % shampoo Apply 1 application topically 2 (two) times a week.  120 mL  3  . latanoprost (XALATAN) 0.005 % ophthalmic solution Place 1 drop into both eyes at bedtime.       Marland Kitchen loperamide (IMODIUM) 1 MG/5ML solution Take 5 mLs (1 mg total) by mouth 4 (four) times daily as needed for diarrhea or loose stools.  120 mL  0  . metFORMIN (GLUCOPHAGE) 500 MG tablet Take 1 tablet (500 mg total) by mouth 2 (two) times daily with a meal.  180 tablet  0  . nystatin (MYCOSTATIN/NYSTOP) 100000 UNIT/GM POWD Apply to affected areas with yeast three times daily as needed  2 Bottle  3  . omeprazole (PRILOSEC) 20 MG capsule Take 1 capsule (20 mg total) by mouth 2 (two) times daily.  180 capsule  0  . potassium chloride SA (K-DUR,KLOR-CON) 20 MEQ tablet Take 1 tablet (20 mEq total) by mouth daily.  90 tablet  3  . pravastatin (PRAVACHOL) 40 MG tablet Take 1 tablet (40 mg total) by mouth every evening.  90 tablet  3  . Propylene Glycol (SYSTANE BALANCE OP) Apply 2 drops to eye 2 (two) times daily as needed (dry eyes).       . sodium chloride (OCEAN) 0.65 % nasal spray Place 1 spray into the nose as needed for congestion.      . [DISCONTINUED] Calcium Carbonate-Vitamin D (CALCIUM-VITAMIN D) 500-200 MG-UNIT per tablet Take 1 tablet by mouth 2 (two) times daily with a meal.        . [DISCONTINUED] fexofenadine (ALLEGRA) 180 MG tablet Take 1 tablet (180 mg total) by mouth daily.  15 tablet  0   No current facility-administered medications on file prior to visit.    BP 120/84  Pulse 95  Temp(Src) 97.9 F (36.6 C) (Oral)  Ht 5\' 2"  (1.575 m)  Wt 207 lb (93.895 kg)  BMI 37.85 kg/m2  SpO2 99%chart    Objective:   Physical Exam  Constitutional: She is oriented to person, place, and time. She appears well-developed and well-nourished.  Neck: Normal range of motion. Neck supple.  Cardiovascular: Normal rate,  regular rhythm and normal heart sounds.   Pulmonary/Chest: Effort normal and breath sounds normal.  Abdominal: Soft. Bowel sounds are normal.  Musculoskeletal: Normal range of motion. She exhibits no edema and no tenderness.  Neurological: She is alert and oriented to person, place, and time.  Skin: Skin is warm and dry.  Psychiatric:  She has a normal mood and affect.          Assessment & Plan:  Katerra was seen today for hip pain.  Diagnoses and associated orders for this visit:  Right hip pain  Osteoarthritis   Advised Aleve OTC twice a day for pain. Tramadol for more severe pain. Call with any questions or concerns.

## 2013-05-25 NOTE — Telephone Encounter (Signed)
Patient had an appointment today and was instructed to take Aleve twice a day with food for hip pain, but she is now saying that she cannot take Aleve due to the hernia medication that she is taking omeprazole (PRILOSEC) 20 MG capsule and the pharmacist advised her that Tylenol would be a better choice for her. Aspirin is not a good choice for her because it causes stomach issues. Patient's contact number has been verified. Thanks!

## 2013-05-25 NOTE — Progress Notes (Signed)
Pre visit review using our clinic review tool, if applicable. No additional management support is needed unless otherwise documented below in the visit note. 

## 2013-05-30 ENCOUNTER — Telehealth: Payer: Self-pay | Admitting: Family

## 2013-05-30 NOTE — Telephone Encounter (Signed)
Patient Information:  Caller Name: Shalia  Phone: [phone]  Patient: Linda Thompson, Linda Thompson  Gender: Female  DOB: 04-Jun-1955  Age: 58 Years  PCP: Roxy Cedar [facility])  Office Follow Up:  Does the office need to follow up with this patient?: No  Instructions For The Office: N/A  RN Note:  Appt scheduled for tomorrow(05/31/13) @ 1400 with Roxy Cedar PA.  Symptoms  Reason For Call & Symptoms: Calling about occas rectal itching--has been using unscented baby wipes and cream without effect. Has occas yeast infections under breasts and in rectal area. Wants an appt to be have it checked out.  Reviewed Health History In EMR: Yes  Reviewed Medications In EMR: Yes  Reviewed Allergies In EMR: Yes  Reviewed Surgeries / Procedures: Yes  Date of Onset of Symptoms: 05/28/2013  Treatments Tried: baby wipes,cream  Treatments Tried Worked: No  Guideline(s) Used:  Rectal Symptoms  Disposition Per Guideline:   See Today or Tomorrow in Office  Reason For Disposition Reached:   Patient wants to be seen  Advice Given:  Treatment of Mild Rectal Itching:  The main treatment for rectal itching is keeping the rectal area clean and dry, and avoiding excessive rubbing or scratching. Loose cotton underwear helps keep the area dry.  Cleansing after a Bowel Movement - Cleanse the anus with warm water after each bowel movement. Use wet cotton or tissue. Pat the area dry using unscented toilet paper. Avoid rubbing area with toilet paper.  Prevention - Avoid scented toilet products. Keep rectal area clean and dry.  Call Back If:  You become worse.  Patient Will Follow Care Advice:  YES  Appointment Scheduled:  05/31/2013 14:00:00 Appointment Scheduled Provider:  Roxy Cedar [facility])

## 2013-05-31 ENCOUNTER — Ambulatory Visit (INDEPENDENT_AMBULATORY_CARE_PROVIDER_SITE_OTHER): Payer: Medicare Other | Admitting: Family

## 2013-05-31 ENCOUNTER — Encounter: Payer: Self-pay | Admitting: Family

## 2013-05-31 VITALS — BP 102/80 | HR 102 | Temp 97.7°F | Ht 62.0 in | Wt 206.0 lb

## 2013-05-31 DIAGNOSIS — L290 Pruritus ani: Secondary | ICD-10-CM

## 2013-05-31 NOTE — Progress Notes (Signed)
Pre visit review using our clinic review tool, if applicable. No additional management support is needed unless otherwise documented below in the visit note. 

## 2013-05-31 NOTE — Progress Notes (Signed)
Subjective:    Patient ID: Linda Thompson, female    DOB: 21-Sep-1955, 58 y.o.   MRN: [id number]  HPI  57 year old caucasian female, nonsmoker presenting today for rectal itching.  The symptoms began 2-3 days ago in the rectum.  She has been using baby wipes and ketocanizole cream that helps.  Itching is worse after defectation.     Review of Systems  Constitutional: Negative.   Respiratory: Negative.   Cardiovascular: Negative.   Gastrointestinal: Negative for rectal pain.  Skin:       Itching the rectal area   Past Medical History  Diagnosis Date  . Allergic rhinitis, cause unspecified   . Pain in joint, ankle and foot   . Backache, unspecified     chronic LBP with radiculopathy  . Seborrheic dermatitis, unspecified   . Type II or unspecified type diabetes mellitus without mention of complication, not stated as uncontrolled   . History of fracture of arm     Right  . History of fracture of foot   . Dermatomycosis, unspecified   . Esophageal reflux   . Unspecified glaucoma   . Undiagnosed cardiac murmurs   . Diaphragmatic hernia without mention of obstruction or gangrene   . Other and unspecified hyperlipidemia   . Unspecified essential hypertension   . Irritable bowel syndrome   . Benign neoplasm of cerebral meninges   . Moderate intellectual disabilities   . Nausea alone   . Osteoarthrosis, unspecified whether generalized or localized, ankle and foot   . Pruritus ani   . Unspecified tinnitus   . Nonspecific elevation of levels of transaminase or lactic acid dehydrogenase (LDH)   . Leukorrhea, not specified as infective   . Obesity   . Hyperglycemia   . DDD (degenerative disc disease), lumbar   . Yeast infection of the skin     Frequent    History   Social History  . Marital Status: Single    Spouse Name: N/A    Number of Children: 0  . Years of Education: N/A   Occupational History  . Disabled (from arm fracture)    Social History Main Topics  .  Smoking status: Never Smoker   . Smokeless tobacco: Never Used  . Alcohol Use: No  . Drug Use: No  . Sexual Activity: Not Currently   Other Topics Concern  . Not on file   Social History Narrative   Lives alone-with help from neighbors      Currently-disability for arm fracture      [name] (neighbor) is POA    Past Surgical History  Procedure Laterality Date  . Total abdominal hysterectomy    . Cataract extraction    . Elbow surgery      right elbow  . Lipoma excision Left [id number]    posterior left axilla    Family History  Problem Relation Age of Onset  . Diabetes Mother   . Diabetes Maternal Aunt   . Heart disease Maternal Aunt      (had pacemaker)  . Diabetes Maternal Grandmother   . Stroke Mother   . Stroke Father   . Stroke Maternal Grandmother     Allergies  Allergen Reactions  . Ace Inhibitors Cough  . Amoxicillin-Pot Clavulanate Diarrhea  . Augmentin [Amoxicillin-Pot Clavulanate] Other (See Comments)    diarrhea  . Azithromycin Diarrhea  . Ceftin [Cefuroxime Axetil]     diarrhea  . Cefuroxime Diarrhea and Other (See Comments)  Generalized weakness  . Cyclobenzaprine Hcl Diarrhea  . Flexeril [Cyclobenzaprine] Diarrhea  . Flonase [Fluticasone Propionate] Other (See Comments)    Nose bleed  . Ibuprofen Other (See Comments)    REACTION: vomiting    Current Outpatient Prescriptions on File Prior to Visit  Medication Sig Dispense Refill  . aspirin EC 81 MG tablet Take 81 mg by mouth daily.      Marland Kitchen desonide (DESOWEN) 0.05 % ointment Apply 1 application topically 2 (two) times daily.      Marland Kitchen diltiazem (TIAZAC) 240 MG 24 hr capsule Take 240 mg by mouth daily.      . hydrochlorothiazide (MICROZIDE) 12.5 MG capsule Take 1 capsule (12.5 mg total) by mouth daily.  90 capsule  0  . ipratropium (ATROVENT) 0.06 % nasal spray Place 2 sprays into both nostrils 4 (four) times daily.  15 mL  1  . ketoconazole (NIZORAL) 2 % shampoo Apply 1 application  topically 2 (two) times a week.  120 mL  3  . latanoprost (XALATAN) 0.005 % ophthalmic solution Place 1 drop into both eyes at bedtime.       Marland Kitchen loperamide (IMODIUM) 1 MG/5ML solution Take 5 mLs (1 mg total) by mouth 4 (four) times daily as needed for diarrhea or loose stools.  120 mL  0  . metFORMIN (GLUCOPHAGE) 500 MG tablet Take 1 tablet (500 mg total) by mouth 2 (two) times daily with a meal.  180 tablet  0  . nystatin (MYCOSTATIN/NYSTOP) 100000 UNIT/GM POWD Apply to affected areas with yeast three times daily as needed  2 Bottle  3  . omeprazole (PRILOSEC) 20 MG capsule Take 1 capsule (20 mg total) by mouth 2 (two) times daily.  180 capsule  0  . potassium chloride SA (K-DUR,KLOR-CON) 20 MEQ tablet Take 1 tablet (20 mEq total) by mouth daily.  90 tablet  3  . pravastatin (PRAVACHOL) 40 MG tablet Take 1 tablet (40 mg total) by mouth every evening.  90 tablet  3  . Propylene Glycol (SYSTANE BALANCE OP) Apply 2 drops to eye 2 (two) times daily as needed (dry eyes).       . sodium chloride (OCEAN) 0.65 % nasal spray Place 1 spray into the nose as needed for congestion.      . traMADol (ULTRAM) 50 MG tablet Take 50 mg by mouth every 6 (six) hours as needed.      . [DISCONTINUED] Calcium Carbonate-Vitamin D (CALCIUM-VITAMIN D) 500-200 MG-UNIT per tablet Take 1 tablet by mouth 2 (two) times daily with a meal.        . [DISCONTINUED] fexofenadine (ALLEGRA) 180 MG tablet Take 1 tablet (180 mg total) by mouth daily.  15 tablet  0   No current facility-administered medications on file prior to visit.    BP 102/80  Pulse 102  Temp(Src) 97.7 F (36.5 C) (Oral)  Ht 5\' 2"  (1.575 m)  Wt 206 lb (93.441 kg)  BMI 37.67 kg/m2  SpO2 93%chart    Objective:   Physical Exam  Constitutional: She appears well-developed and well-nourished.  Cardiovascular: Normal rate, regular rhythm and normal heart sounds.   Pulmonary/Chest: Effort normal and breath sounds normal.  Skin: Skin is warm and dry. No rash  noted. No erythema.  Skin intact.  No redness or excoriation noted.          Assessment & Plan:  Assessment 1. Yeast to skin  Plan 1. Continue current medications.  2. Wash area and dry thoroughly.  3.  Contact office for questions or concerns.

## 2013-05-31 NOTE — Patient Instructions (Signed)
Yeast Infection of the Skin Some yeast on the skin is normal, but sometimes it causes an infection. If you have a yeast infection, it shows up as white or light brown patches on brown skin. You can see it better in the summer on tan skin. It causes light-colored holes in your suntan. It can happen on any area of the body. This cannot be passed from person to person. HOME CARE  Scrub your skin daily with a dandruff shampoo. Your rash may take a couple weeks to get well.  Do not scratch or itch the rash. GET HELP RIGHT AWAY IF:   You get another infection from scratching. The skin may get warm, red, and may ooze fluid.  The infection does not seem to be getting better. MAKE SURE YOU:  Understand these instructions.  Will watch your condition.  Will get help right away if you are not doing well or get worse. Document Released: 12/20/2007 Document Revised: 03/31/2011 Document Reviewed: 12/20/2007 ExitCare Patient Information 2014 [employer].  

## 2013-06-22 ENCOUNTER — Telehealth: Payer: Self-pay | Admitting: Family

## 2013-06-22 MED ORDER — IPRATROPIUM BROMIDE 0.06 % NA SOLN: 2.0000 | Freq: Four times a day (QID) | NASAL | Status: DC

## 2013-06-22 NOTE — Telephone Encounter (Signed)
Patient Information:  Caller Name: Adriona  Phone: [phone]  Patient: Shon, Mansouri  Gender: Female  DOB: Jun 25, 1955  Age: 58 Years  PCP: Roxy [facility])  Office Follow Up:  Does the office need to follow up with this patient?: No  Instructions For The Office: N/A   Symptoms  Reason For Call & Symptoms: Pt has dry hands. She was advised to throw away her hand lotion with alcohol in it and start using Amlactin cream. Pt is calling to see if this is the right thing to do.  Reviewed Health History In EMR: Yes  Reviewed Medications In EMR: Yes  Reviewed Allergies In EMR: Yes  Reviewed Surgeries / Procedures: Yes  Date of Onset of Symptoms: 06/21/2013  Guideline(s) Used:  No Protocol Available - Information Only  Disposition Per Guideline:   Home Care  Reason For Disposition Reached:   Information only question and nurse able to answer  Advice Given:  Call Back If:  New symptoms develop  You become worse.  Patient Will Follow Care Advice:  YES

## 2013-06-22 NOTE — Telephone Encounter (Signed)
rx sent in electronically 

## 2013-06-22 NOTE — Telephone Encounter (Signed)
Noted  

## 2013-06-22 NOTE — Telephone Encounter (Signed)
Pt request refill of ipratropium (ATROVENT) 0.06 % nasal spray [facility] [address]

## 2013-06-28 ENCOUNTER — Ambulatory Visit (INDEPENDENT_AMBULATORY_CARE_PROVIDER_SITE_OTHER): Payer: Medicare Other | Admitting: Family

## 2013-06-28 ENCOUNTER — Encounter: Payer: Self-pay | Admitting: Family

## 2013-06-28 VITALS — BP 120/88 | HR 109 | Temp 98.3°F | Wt 208.0 lb

## 2013-06-28 DIAGNOSIS — N760 Acute vaginitis: Secondary | ICD-10-CM | POA: Diagnosis not present

## 2013-06-28 DIAGNOSIS — I10 Essential (primary) hypertension: Secondary | ICD-10-CM | POA: Diagnosis not present

## 2013-06-28 NOTE — Patient Instructions (Signed)
Vaginitis Vaginitis is an inflammation of the vagina. It is most often caused by a change in the normal balance of the bacteria and yeast that live in the vagina. This change in balance causes an overgrowth of certain bacteria or yeast, which causes the inflammation. There are different types of vaginitis, but the most common types are:  Bacterial vaginosis.  Yeast infection (candidiasis).  Trichomoniasis vaginitis. This is a sexually transmitted infection (STI).  Viral vaginitis.  Atropic vaginitis.  Allergic vaginitis. CAUSES  The cause depends on the type of vaginitis. Vaginitis can be caused by:  Bacteria (bacterial vaginosis).  Yeast (yeast infection).  A parasite (trichomoniasis vaginitis)  A virus (viral vaginitis).  Low hormone levels (atrophic vaginitis). Low hormone levels can occur during pregnancy, breastfeeding, or after menopause.  Irritants, such as bubble baths, scented tampons, and feminine sprays (allergic vaginitis). Other factors can change the normal balance of the yeast and bacteria that live in the vagina. These include:  Antibiotic medicines.  Poor hygiene.  Diaphragms, vaginal sponges, spermicides, birth control pills, and intrauterine devices (IUD).  Sexual intercourse.  Infection.  Uncontrolled diabetes.  A weakened immune system. SYMPTOMS  Symptoms can vary depending on the cause of the vaginitis. Common symptoms include:  Abnormal vaginal discharge.  The discharge is white, gray, or yellow with bacterial vaginosis.  The discharge is thick, white, and cheesy with a yeast infection.  The discharge is frothy and yellow or greenish with trichomoniasis.  A bad vaginal odor.  The odor is fishy with bacterial vaginosis.  Vaginal itching, pain, or swelling.  Painful intercourse.  Pain or burning when urinating. Sometimes, there are no symptoms. TREATMENT  Treatment will vary depending on the type of infection.   Bacterial  vaginosis and trichomoniasis are often treated with antibiotic creams or pills.  Yeast infections are often treated with antifungal medicines, such as vaginal creams or suppositories.  Viral vaginitis has no cure, but symptoms can be treated with medicines that relieve discomfort. Your sexual partner should be treated as well.  Atrophic vaginitis may be treated with an estrogen cream, pill, suppository, or vaginal ring. If vaginal dryness occurs, lubricants and moisturizing creams may help. You may be told to avoid scented soaps, sprays, or douches.  Allergic vaginitis treatment involves quitting the use of the product that is causing the problem. Vaginal creams can be used to treat the symptoms. HOME CARE INSTRUCTIONS   Take all medicines as directed by your caregiver.  Keep your genital area clean and dry. Avoid soap and only rinse the area with water.  Avoid douching. It can remove the healthy bacteria in the vagina.  Do not use tampons or have sexual intercourse until your vaginitis has been treated. Use sanitary pads while you have vaginitis.  Wipe from front to back. This avoids the spread of bacteria from the rectum to the vagina.  Let air reach your genital area.  Wear cotton underwear to decrease moisture buildup.  Avoid wearing underwear while you sleep until your vaginitis is gone.  Avoid tight pants and underwear or nylons without a cotton panel.  Take off wet clothing (especially bathing suits) as soon as possible.  Use mild, non-scented products. Avoid using irritants, such as:  Scented feminine sprays.  Fabric softeners.  Scented detergents.  Scented tampons.  Scented soaps or bubble baths.  Practice safe sex and use condoms. Condoms may prevent the spread of trichomoniasis and viral vaginitis. SEEK MEDICAL CARE IF:   You have abdominal pain.  You   have a fever or persistent symptoms for more than 2 3 days.  You have a fever and your symptoms suddenly  get worse. Document Released: 11/03/2006 Document Revised: 10/01/2011 Document Reviewed: 06/19/2011 ExitCare Patient Information 2014 [employer].  

## 2013-06-28 NOTE — Progress Notes (Signed)
Subjective:    Patient ID: Linda Thompson, female    DOB: May 18, 1955, 58 y.o.   MRN: [id number]  HPI 58 y.o. White female with history of type 2 daibetes ,hyperlipidemia, hypertension, dermatitis, heart murmur, IBS and mental retardation presents today with chief complaint of "I got a yeast infection again". Pt has history of multiple yeast infections and has been using ketoconazole and nystatin powder to help. Pt states that the "yeast infection is getting better, I just wanted to let you all know that I have it". Pt states she has had some itching, redness and soreness to her vagina and under her breast. She denies vaginal discharge and malodorous smell. Denies burning with urination and vaginal bleeding. Denies fever, chills, fatigue, SOB and change in appetite.     Review of Systems  Constitutional: Negative.   HENT: Negative.   Respiratory: Negative.   Cardiovascular: Negative.   Gastrointestinal: Negative.   Endocrine: Negative.   Genitourinary: Positive for vaginal pain. Negative for dysuria, frequency, flank pain, vaginal bleeding, difficulty urinating and dyspareunia.       Acknowledges vaginal itching   Musculoskeletal: Negative.   Skin: Positive for rash.       To vagina and under breast   Allergic/Immunologic: Negative.   Neurological: Negative.   Hematological: Negative.   Psychiatric/Behavioral: Negative.    Past Medical History  Diagnosis Date  . Allergic rhinitis, cause unspecified   . Pain in joint, ankle and foot   . Backache, unspecified     chronic LBP with radiculopathy  . Seborrheic dermatitis, unspecified   . Type II or unspecified type diabetes mellitus without mention of complication, not stated as uncontrolled   . History of fracture of arm     Right  . History of fracture of foot   . Dermatomycosis, unspecified   . Esophageal reflux   . Unspecified glaucoma   . Undiagnosed cardiac murmurs   . Diaphragmatic hernia without mention of obstruction or  gangrene   . Other and unspecified hyperlipidemia   . Unspecified essential hypertension   . Irritable bowel syndrome   . Benign neoplasm of cerebral meninges   . Moderate intellectual disabilities   . Nausea alone   . Osteoarthrosis, unspecified whether generalized or localized, ankle and foot   . Pruritus ani   . Unspecified tinnitus   . Nonspecific elevation of levels of transaminase or lactic acid dehydrogenase (LDH)   . Leukorrhea, not specified as infective   . Obesity   . Hyperglycemia   . DDD (degenerative disc disease), lumbar   . Yeast infection of the skin     Frequent    History   Social History  . Marital Status: Single    Spouse Name: N/A    Number of Children: 0  . Years of Education: N/A   Occupational History  . Disabled (from arm fracture)    Social History Main Topics  . Smoking status: Never Smoker   . Smokeless tobacco: Never Used  . Alcohol Use: No  . Drug Use: No  . Sexual Activity: Not Currently   Other Topics Concern  . Not on file   Social History Narrative   Lives alone-with help from neighbors      Currently-disability for arm fracture      [name] (neighbor) is POA    Past Surgical History  Procedure Laterality Date  . Total abdominal hysterectomy    . Cataract extraction    . Elbow surgery  right elbow  . Lipoma excision Left [id number]    posterior left axilla    Family History  Problem Relation Age of Onset  . Diabetes Mother   . Diabetes Maternal Aunt   . Heart disease Maternal Aunt      (had pacemaker)  . Diabetes Maternal Grandmother   . Stroke Mother   . Stroke Father   . Stroke Maternal Grandmother     Allergies  Allergen Reactions  . Ace Inhibitors Cough  . Amoxicillin-Pot Clavulanate Diarrhea  . Augmentin [Amoxicillin-Pot Clavulanate] Other (See Comments)    diarrhea  . Azithromycin Diarrhea  . Ceftin [Cefuroxime Axetil]     diarrhea  . Cefuroxime Diarrhea and Other (See Comments)     Generalized weakness  . Cyclobenzaprine Hcl Diarrhea  . Flexeril [Cyclobenzaprine] Diarrhea  . Flonase [Fluticasone Propionate] Other (See Comments)    Nose bleed  . Ibuprofen Other (See Comments)    REACTION: vomiting    Current Outpatient Prescriptions on File Prior to Visit  Medication Sig Dispense Refill  . aspirin EC 81 MG tablet Take 81 mg by mouth daily.      Marland Kitchen desonide (DESOWEN) 0.05 % ointment Apply 1 application topically 2 (two) times daily.      Marland Kitchen diltiazem (TIAZAC) 240 MG 24 hr capsule Take 240 mg by mouth daily.      . hydrochlorothiazide (MICROZIDE) 12.5 MG capsule Take 1 capsule (12.5 mg total) by mouth daily.  90 capsule  0  . ipratropium (ATROVENT) 0.06 % nasal spray Place 2 sprays into both nostrils 4 (four) times daily.  15 mL  5  . ketoconazole (NIZORAL) 2 % shampoo Apply 1 application topically 2 (two) times a week.  120 mL  3  . latanoprost (XALATAN) 0.005 % ophthalmic solution Place 1 drop into both eyes at bedtime.       Marland Kitchen loperamide (IMODIUM) 1 MG/5ML solution Take 5 mLs (1 mg total) by mouth 4 (four) times daily as needed for diarrhea or loose stools.  120 mL  0  . metFORMIN (GLUCOPHAGE) 500 MG tablet Take 1 tablet (500 mg total) by mouth 2 (two) times daily with a meal.  180 tablet  0  . nystatin (MYCOSTATIN/NYSTOP) 100000 UNIT/GM POWD Apply to affected areas with yeast three times daily as needed  2 Bottle  3  . omeprazole (PRILOSEC) 20 MG capsule Take 1 capsule (20 mg total) by mouth 2 (two) times daily.  180 capsule  0  . potassium chloride SA (K-DUR,KLOR-CON) 20 MEQ tablet Take 1 tablet (20 mEq total) by mouth daily.  90 tablet  3  . pravastatin (PRAVACHOL) 40 MG tablet Take 1 tablet (40 mg total) by mouth every evening.  90 tablet  3  . Propylene Glycol (SYSTANE BALANCE OP) Apply 2 drops to eye 2 (two) times daily as needed (dry eyes).       . sodium chloride (OCEAN) 0.65 % nasal spray Place 1 spray into the nose as needed for congestion.      . traMADol  (ULTRAM) 50 MG tablet Take 50 mg by mouth every 6 (six) hours as needed.      . [DISCONTINUED] Calcium Carbonate-Vitamin D (CALCIUM-VITAMIN D) 500-200 MG-UNIT per tablet Take 1 tablet by mouth 2 (two) times daily with a meal.        . [DISCONTINUED] fexofenadine (ALLEGRA) 180 MG tablet Take 1 tablet (180 mg total) by mouth daily.  15 tablet  0   No current facility-administered medications on file  prior to visit.    BP 120/88  Pulse 109  Temp(Src) 98.3 F (36.8 C) (Oral)  Wt 208 lb (94.348 kg)  SpO2 97%chart    Objective:   Physical Exam  Constitutional: She is oriented to person, place, and time. She appears well-developed and well-nourished. She is active.  Cardiovascular: Normal rate, regular rhythm, normal heart sounds and normal pulses.   Pulmonary/Chest: Effort normal and breath sounds normal.  Abdominal: Soft. Normal appearance and bowel sounds are normal.  Genitourinary: Pelvic exam was performed with patient supine. There is rash on the right labia. There is rash on the left labia. There is erythema around the vagina.  Erythema to vagina.   Neurological: She is alert and oriented to person, place, and time.  Skin: Skin is warm, dry and intact. Rash noted.  erythema rash to vagina and under breast   Psychiatric: Her speech is normal and behavior is normal. Thought content normal. Her mood appears anxious. Cognition and memory are normal.          Assessment & Plan:  Lenni was seen today for vaginitis.  Diagnoses and associated orders for this visit:  Vaginitis and vulvovaginitis - KOH prep   Education: Continue to use nystatin powder and ketoconazole cream as needed for itching and rash.   Follow up: Pending wet prep results

## 2013-06-28 NOTE — Progress Notes (Signed)
Pre visit review using our clinic review tool, if applicable. No additional management support is needed unless otherwise documented below in the visit note. 

## 2013-06-29 ENCOUNTER — Encounter: Payer: Self-pay | Admitting: Family

## 2013-06-29 ENCOUNTER — Other Ambulatory Visit (HOSPITAL_COMMUNITY)
Admission: RE | Admit: 2013-06-29 | Discharge: 2013-06-29 | Disposition: A | Discharge status: Home / Self Care | Payer: Medicare Other | Source: Ambulatory Visit | Attending: Family | Admitting: Family

## 2013-06-29 ENCOUNTER — Other Ambulatory Visit: Payer: Self-pay | Admitting: Family

## 2013-06-29 DIAGNOSIS — Z113 Encounter for screening for infections with a predominantly sexual mode of transmission: Secondary | ICD-10-CM | POA: Diagnosis not present

## 2013-06-29 DIAGNOSIS — N760 Acute vaginitis: Secondary | ICD-10-CM | POA: Diagnosis not present

## 2013-06-30 NOTE — Addendum Note (Signed)
Addended byRoxy Cedar B on: 06/30/2013 11:42 AM   Modules accepted: Orders

## 2013-07-13 DIAGNOSIS — Z1231 Encounter for screening mammogram for malignant neoplasm of breast: Secondary | ICD-10-CM | POA: Diagnosis not present

## 2013-07-19 ENCOUNTER — Other Ambulatory Visit: Payer: Self-pay | Admitting: Family Medicine

## 2013-07-20 ENCOUNTER — Telehealth: Payer: Self-pay | Admitting: Family

## 2013-07-20 MED ORDER — DILTIAZEM HCL ER BEADS 240 MG PO CP24: 240.0000 mg | ORAL_CAPSULE | Freq: Every day | ORAL | Status: DC

## 2013-07-20 NOTE — Telephone Encounter (Signed)
Pt req rx on diltiazem (TIAZAC) 240 MG 24 hr capsule   Pharmacy ;  [facility]  [place] [phone]

## 2013-07-20 NOTE — Telephone Encounter (Signed)
Done

## 2013-07-27 ENCOUNTER — Telehealth: Payer: Self-pay | Admitting: Family

## 2013-07-27 NOTE — Telephone Encounter (Signed)
Noted  

## 2013-07-27 NOTE — Telephone Encounter (Signed)
Patient Information:  Caller Name: Charlita  Phone: [phone]  Patient: Linda Thompson, Linda Thompson  Gender: Female  DOB: Dec 08, 1955  Age: 58 Years  PCP: Roxy Cedar [facility])  Office Follow Up:  Does the office need to follow up with this patient?: No  Instructions For The Office: N/A  RN Note:  Afebrile. Mild weakness and can do ADL's not effected can do the wash and watch TV. RN/CAN gave home care office.  Symptoms  Reason For Call & Symptoms: Weakness  Reviewed Health History In EMR: Yes  Reviewed Medications In EMR: Yes  Reviewed Allergies In EMR: Yes  Reviewed Surgeries / Procedures: Yes  Date of Onset of Symptoms: 07/27/2013  Guideline(s) Used:  Weakness (Generalized) and Fatigue  Disposition Per Guideline:   Home Care  Reason For Disposition Reached:   Mild weakness or fatigue with acute minor illness (e.g., colds)  Advice Given:  Reassurance  Here is some care advice that should help.  Call Back If:  Unable to stand or walk  Passes out  Breathing difficulty occurs  You become worse.  Patient Will Follow Care Advice:  YES

## 2013-07-28 ENCOUNTER — Other Ambulatory Visit: Payer: Self-pay | Admitting: Family

## 2013-08-01 ENCOUNTER — Encounter: Payer: Self-pay | Admitting: Family

## 2013-08-04 ENCOUNTER — Ambulatory Visit (INDEPENDENT_AMBULATORY_CARE_PROVIDER_SITE_OTHER): Payer: Medicare Other | Admitting: Podiatrist

## 2013-08-04 ENCOUNTER — Ambulatory Visit: Payer: Medicare Other | Admitting: Family

## 2013-08-04 DIAGNOSIS — E1149 Type 2 diabetes mellitus with other diabetic neurological complication: Secondary | ICD-10-CM | POA: Diagnosis not present

## 2013-08-04 DIAGNOSIS — L608 Other nail disorders: Secondary | ICD-10-CM

## 2013-08-04 DIAGNOSIS — E1140 Type 2 diabetes mellitus with diabetic neuropathy, unspecified: Secondary | ICD-10-CM

## 2013-08-04 DIAGNOSIS — E1142 Type 2 diabetes mellitus with diabetic polyneuropathy: Secondary | ICD-10-CM

## 2013-08-04 NOTE — Progress Notes (Signed)
HPI: Patient presents today for follow up of diabetic foot and nail care. Past medical history, meds, and allergies reviewed. Patient states blood sugar is under good  control.   Objective:   Objective:  Patients chart is reviewed.  Vascular status reveals pedal pulses noted at  1 out of 4 dp and pt bilateral .  Neurological sensation is Decreased to [employer] monofilament bilateral at 2/5 sites bilateral.  Dermatological exam reveals  absence of pre ulcerative/ hyperkeratotic lesions.   Toenails are elongated, incurvated, discolored, dystrophic with ingrown deformity present.    Assessment: Diabetes with Neuropathy, Ingrown nail deformity,   Plan: Discussed treatment options and alternatives. Debrided nails without complication. .  Return appointment recommended at routine intervals of 3 months. She will continue to use the topical nail film on her toenails.

## 2013-08-05 ENCOUNTER — Ambulatory Visit: Payer: Medicare Other

## 2013-08-08 ENCOUNTER — Ambulatory Visit (INDEPENDENT_AMBULATORY_CARE_PROVIDER_SITE_OTHER): Payer: Medicare Other | Admitting: Family

## 2013-08-08 ENCOUNTER — Encounter: Payer: Self-pay | Admitting: Family

## 2013-08-08 VITALS — BP 120/82 | HR 107 | Wt 211.0 lb

## 2013-08-08 DIAGNOSIS — E119 Type 2 diabetes mellitus without complications: Secondary | ICD-10-CM | POA: Diagnosis not present

## 2013-08-08 DIAGNOSIS — E785 Hyperlipidemia, unspecified: Secondary | ICD-10-CM | POA: Diagnosis not present

## 2013-08-08 DIAGNOSIS — I10 Essential (primary) hypertension: Secondary | ICD-10-CM | POA: Diagnosis not present

## 2013-08-08 LAB — LIPID PANEL
Cholesterol: 147 mg/dL (ref 0–200)
HDL: 35.9 mg/dL — ABNORMAL LOW (ref >39.00)
LDL Cholesterol: 79 mg/dL (ref 0–99)
NONHDL: 111.1
Total CHOL/HDL Ratio: 4
Triglycerides: 161 mg/dL — ABNORMAL HIGH (ref 0.0–149.0)
VLDL: 32.2 mg/dL (ref 0.0–40.0)

## 2013-08-08 LAB — HEMOGLOBIN A1C: HEMOGLOBIN A1C: 7 % — AB (ref 4.6–6.5)

## 2013-08-08 NOTE — Progress Notes (Signed)
Subjective:    Patient ID: Linda Thompson, female    DOB: 1955-04-21, 58 y.o.   MRN: [id number]  HPI 58 y.o. White female presents today for diabetes follow up. Pt currently takes Metformin for her diabetes. She reports she takes the medication as prescribed, but has not been exercising often. Pt denies polyuria, polydipisia and polyphagia. Denies fever, chills, fatigue, SOB and change in appetite.     Review of Systems  Constitutional: Negative.   Eyes: Negative.   Respiratory: Negative.   Cardiovascular: Negative.   Gastrointestinal: Negative.   Endocrine: Negative.   Genitourinary: Negative.   Musculoskeletal: Negative.   Skin: Negative.   Allergic/Immunologic: Negative.   Neurological: Negative.   Psychiatric/Behavioral: Negative.    Past Medical History  Diagnosis Date  . Allergic rhinitis, cause unspecified   . Pain in joint, ankle and foot   . Backache, unspecified     chronic LBP with radiculopathy  . Seborrheic dermatitis, unspecified   . Type II or unspecified type diabetes mellitus without mention of complication, not stated as uncontrolled   . History of fracture of arm     Right  . History of fracture of foot   . Dermatomycosis, unspecified   . Esophageal reflux   . Unspecified glaucoma   . Undiagnosed cardiac murmurs   . Diaphragmatic hernia without mention of obstruction or gangrene   . Other and unspecified hyperlipidemia   . Unspecified essential hypertension   . Irritable bowel syndrome   . Benign neoplasm of cerebral meninges   . Moderate intellectual disabilities   . Nausea alone   . Osteoarthrosis, unspecified whether generalized or localized, ankle and foot   . Pruritus ani   . Unspecified tinnitus   . Nonspecific elevation of levels of transaminase or lactic acid dehydrogenase (LDH)   . Leukorrhea, not specified as infective   . Obesity   . Hyperglycemia   . DDD (degenerative disc disease), lumbar   . Yeast infection of the skin    Frequent    History   Social History  . Marital Status: Single    Spouse Name: N/A    Number of Children: 0  . Years of Education: N/A   Occupational History  . Disabled (from arm fracture)    Social History Main Topics  . Smoking status: Never Smoker   . Smokeless tobacco: Never Used  . Alcohol Use: No  . Drug Use: No  . Sexual Activity: Not Currently   Other Topics Concern  . Not on file   Social History Narrative   Lives alone-with help from neighbors      Currently-disability for arm fracture      [name] (neighbor) is POA    Past Surgical History  Procedure Laterality Date  . Total abdominal hysterectomy    . Cataract extraction    . Elbow surgery      right elbow  . Lipoma excision Left [id number]    posterior left axilla    Family History  Problem Relation Age of Onset  . Diabetes Mother   . Diabetes Maternal Aunt   . Heart disease Maternal Aunt      (had pacemaker)  . Diabetes Maternal Grandmother   . Stroke Mother   . Stroke Father   . Stroke Maternal Grandmother     Allergies  Allergen Reactions  . Ace Inhibitors Cough  . Amoxicillin-Pot Clavulanate Diarrhea  . Augmentin [Amoxicillin-Pot Clavulanate] Other (See Comments)    diarrhea  . Azithromycin  Diarrhea  . Ceftin [Cefuroxime Axetil]     diarrhea  . Cefuroxime Diarrhea and Other (See Comments)    Generalized weakness  . Cyclobenzaprine Hcl Diarrhea  . Flexeril [Cyclobenzaprine] Diarrhea  . Flonase [Fluticasone Propionate] Other (See Comments)    Nose bleed  . Ibuprofen Other (See Comments)    REACTION: vomiting    Current Outpatient Prescriptions on File Prior to Visit  Medication Sig Dispense Refill  . aspirin EC 81 MG tablet Take 81 mg by mouth daily.      Marland Kitchen desonide (DESOWEN) 0.05 % ointment Apply 1 application topically 2 (two) times daily.      Marland Kitchen diltiazem (TIAZAC) 240 MG 24 hr capsule Take 1 capsule (240 mg total) by mouth daily.  90 capsule  0  . hydrochlorothiazide  (MICROZIDE) 12.5 MG capsule Take 1 capsule (12.5 mg total) by mouth daily.  90 capsule  0  . ipratropium (ATROVENT) 0.06 % nasal spray Place 2 sprays into both nostrils 4 (four) times daily.  15 mL  5  . ketoconazole (NIZORAL) 2 % shampoo Apply 1 application topically 2 (two) times a week.  120 mL  3  . latanoprost (XALATAN) 0.005 % ophthalmic solution Place 1 drop into both eyes at bedtime.       Marland Kitchen loperamide (IMODIUM) 1 MG/5ML solution Take 5 mLs (1 mg total) by mouth 4 (four) times daily as needed for diarrhea or loose stools.  120 mL  0  . metFORMIN (GLUCOPHAGE) 500 MG tablet Take 1 tablet (500 mg total) by mouth 2 (two) times daily with a meal.  180 tablet  0  . nystatin (MYCOSTATIN/NYSTOP) 100000 UNIT/GM POWD Apply to affected areas with yeast three times daily as needed  2 Bottle  3  . omeprazole (PRILOSEC) 20 MG capsule TAKE 1 CAPSULE (20 MG TOTAL) BY MOUTH 2 (TWO) TIMES DAILY.  180 capsule  3  . potassium chloride SA (K-DUR,KLOR-CON) 20 MEQ tablet Take 1 tablet (20 mEq total) by mouth daily.  90 tablet  3  . pravastatin (PRAVACHOL) 40 MG tablet Take 1 tablet (40 mg total) by mouth every evening.  90 tablet  3  . Propylene Glycol (SYSTANE BALANCE OP) Apply 2 drops to eye 2 (two) times daily as needed (dry eyes).       . sodium chloride (OCEAN) 0.65 % nasal spray Place 1 spray into the nose as needed for congestion.      . traMADol (ULTRAM) 50 MG tablet Take 50 mg by mouth every 6 (six) hours as needed.      . [DISCONTINUED] Calcium Carbonate-Vitamin D (CALCIUM-VITAMIN D) 500-200 MG-UNIT per tablet Take 1 tablet by mouth 2 (two) times daily with a meal.        . [DISCONTINUED] fexofenadine (ALLEGRA) 180 MG tablet Take 1 tablet (180 mg total) by mouth daily.  15 tablet  0   No current facility-administered medications on file prior to visit.    BP 120/82  Pulse 107  Wt 211 lb (95.709 kg)chart     Objective:   Physical Exam  Constitutional: She is oriented to person, place, and time.  She appears well-developed and well-nourished. She is active.  HENT:  Head: Normocephalic.  Cardiovascular: Normal rate, regular rhythm, normal heart sounds and normal pulses.   Pulmonary/Chest: Effort normal and breath sounds normal.  Neurological: She is alert and oriented to person, place, and time.  Skin: Skin is warm, dry and intact.  Psychiatric: She has a normal mood and affect.  Her speech is normal and behavior is normal. Thought content normal.          Assessment & Plan:  Linda Thompson was seen today for follow-up.  Diagnoses and associated orders for this visit:  Type 2 diabetes mellitus without complication - Hemoglobin A1c - Lipid Panel  Unspecified essential hypertension - Lipid Panel  Other and unspecified hyperlipidemia - Lipid Panel   Education: Exercise at least 30 minutes, 5x per week. Eat healthy diet.   Follow up: 4 months

## 2013-08-08 NOTE — Patient Instructions (Signed)
Exercise to Lose Weight Exercise and a healthy diet may help you lose weight. Your doctor may suggest specific exercises. EXERCISE IDEAS AND TIPS  Choose low-cost things you enjoy doing, such as walking, bicycling, or exercising to workout videos.  Take stairs instead of the elevator.  Walk during your lunch break.  Park your car further away from work or school.  Go to a gym or an exercise class.  Start with 5 to 10 minutes of exercise each day. Build up to 30 minutes of exercise 4 to 6 days a week.  Wear shoes with good support and comfortable clothes.  Stretch before and after working out.  Work out until you breathe harder and your heart beats faster.  Drink extra water when you exercise.  Do not do so much that you hurt yourself, feel dizzy, or get very short of breath. Exercises that burn about 150 calories:  Running 1  miles in 15 minutes.  Playing volleyball for 45 to 60 minutes.  Washing and waxing a car for 45 to 60 minutes.  Playing touch football for 45 minutes.  Walking 1  miles in 35 minutes.  Pushing a stroller 1  miles in 30 minutes.  Playing basketball for 30 minutes.  Raking leaves for 30 minutes.  Bicycling 5 miles in 30 minutes.  Walking 2 miles in 30 minutes.  Dancing for 30 minutes.  Shoveling snow for 15 minutes.  Swimming laps for 20 minutes.  Walking up stairs for 15 minutes.  Bicycling 4 miles in 15 minutes.  Gardening for 30 to 45 minutes.  Jumping rope for 15 minutes.  Washing windows or floors for 45 to 60 minutes. Document Released: 02/08/2010 Document Revised: 03/31/2011 Document Reviewed: 02/08/2010 [employer] Patient Information 2015 [employer]. This information is not intended to replace advice given to you by your health care provider. Make sure you discuss any questions you have with your health care provider.  

## 2013-08-08 NOTE — Progress Notes (Signed)
Pre visit review using our clinic review tool, if applicable. No additional management support is needed unless otherwise documented below in the visit note. 

## 2013-08-19 ENCOUNTER — Other Ambulatory Visit: Payer: Self-pay | Admitting: Family

## 2013-08-24 ENCOUNTER — Other Ambulatory Visit: Payer: Self-pay | Admitting: Family

## 2013-08-25 ENCOUNTER — Encounter: Payer: Self-pay | Admitting: Family Medicine

## 2013-08-25 ENCOUNTER — Ambulatory Visit (INDEPENDENT_AMBULATORY_CARE_PROVIDER_SITE_OTHER): Payer: Medicare Other | Admitting: Family Medicine

## 2013-08-25 VITALS — BP 122/82 | HR 82 | Temp 97.6°F | Ht 62.0 in | Wt 210.5 lb

## 2013-08-25 DIAGNOSIS — R109 Unspecified abdominal pain: Secondary | ICD-10-CM

## 2013-08-25 NOTE — Progress Notes (Signed)
No chief complaint on file.   HPI:  Acute visit for:  1)stomach issue: -pcp unavailable -pt has intellectual disabilities, moderate mental retardation -reports: has hiatal hernia, reports prior doctor told her not to use oil, she used oil to fry her eggs today then felt like her bowels needed to to move (reports NOT pain or anything) then felt better after normal bowel movement -she wondered if needs to change her medications or not -denies: fevers, vomiting, diarrhea, abd pain, melena, hematochezia, unexplained weight loss, malaise, recent abx or travel, constipation  ROS: See pertinent positives and negatives per HPI.  Past Medical History  Diagnosis Date  . Allergic rhinitis, cause unspecified   . Pain in joint, ankle and foot   . Backache, unspecified     chronic LBP with radiculopathy  . Seborrheic dermatitis, unspecified   . Type II or unspecified type diabetes mellitus without mention of complication, not stated as uncontrolled   . History of fracture of arm     Right  . History of fracture of foot   . Dermatomycosis, unspecified   . Esophageal reflux   . Unspecified glaucoma   . Undiagnosed cardiac murmurs   . Diaphragmatic hernia without mention of obstruction or gangrene   . Other and unspecified hyperlipidemia   . Unspecified essential hypertension   . Irritable bowel syndrome   . Benign neoplasm of cerebral meninges   . Moderate intellectual disabilities   . Nausea alone   . Osteoarthrosis, unspecified whether generalized or localized, ankle and foot   . Pruritus ani   . Unspecified tinnitus   . Nonspecific elevation of levels of transaminase or lactic acid dehydrogenase (LDH)   . Leukorrhea, not specified as infective   . Obesity   . Hyperglycemia   . DDD (degenerative disc disease), lumbar   . Yeast infection of the skin     Frequent    Past Surgical History  Procedure Laterality Date  . Total abdominal hysterectomy    . Cataract extraction    .  Elbow surgery      right elbow  . Lipoma excision Left [id number]    posterior left axilla    Family History  Problem Relation Age of Onset  . Diabetes Mother   . Diabetes Maternal Aunt   . Heart disease Maternal Aunt      (had pacemaker)  . Diabetes Maternal Grandmother   . Stroke Mother   . Stroke Father   . Stroke Maternal Grandmother     History   Social History  . Marital Status: Single    Spouse Name: N/A    Number of Children: 0  . Years of Education: N/A   Occupational History  . Disabled (from arm fracture)    Social History Main Topics  . Smoking status: Never Smoker   . Smokeless tobacco: Never Used  . Alcohol Use: No  . Drug Use: No  . Sexual Activity: Not Currently   Other Topics Concern  . None   Social History Narrative   Lives alone-with help from neighbors      Currently-disability for arm fracture      [name] (neighbor) is POA    Current outpatient prescriptions:aspirin EC 81 MG tablet, Take 81 mg by mouth daily., Disp: , Rfl: ;  desonide (DESOWEN) 0.05 % ointment, Apply 1 application topically 2 (two) times daily., Disp: , Rfl: ;  diltiazem (TIAZAC) 240 MG 24 hr capsule, Take 1 capsule (240 mg total) by mouth daily., Disp:  90 capsule, Rfl: 0;  hydrochlorothiazide (MICROZIDE) 12.5 MG capsule, TAKE 1 CAPSULE (12.5 MG TOTAL) BY MOUTH DAILY., Disp: 90 capsule, Rfl: 0 ipratropium (ATROVENT) 0.06 % nasal spray, Place 2 sprays into both nostrils 4 (four) times daily., Disp: 15 mL, Rfl: 5;  ketoconazole (NIZORAL) 2 % cream, APPLY TOPICALLY DAILY. AS NEEDED TO AFFECTED AREA, Disp: 60 g, Rfl: 0;  ketoconazole (NIZORAL) 2 % shampoo, Apply 1 application topically 2 (two) times a week., Disp: 120 mL, Rfl: 3;  latanoprost (XALATAN) 0.005 % ophthalmic solution, Place 1 drop into both eyes at bedtime. , Disp: , Rfl:  metFORMIN (GLUCOPHAGE) 500 MG tablet, Take 1 tablet (500 mg total) by mouth 2 (two) times daily with a meal., Disp: 180 tablet, Rfl: 0;  nystatin  (MYCOSTATIN/NYSTOP) 100000 UNIT/GM POWD, Apply to affected areas with yeast three times daily as needed, Disp: 2 Bottle, Rfl: 3;  omeprazole (PRILOSEC) 20 MG capsule, TAKE 1 CAPSULE (20 MG TOTAL) BY MOUTH 2 (TWO) TIMES DAILY., Disp: 180 capsule, Rfl: 3 potassium chloride SA (K-DUR,KLOR-CON) 20 MEQ tablet, Take 1 tablet (20 mEq total) by mouth daily., Disp: 90 tablet, Rfl: 3;  pravastatin (PRAVACHOL) 40 MG tablet, Take 1 tablet (40 mg total) by mouth every evening., Disp: 90 tablet, Rfl: 3;  Propylene Glycol (SYSTANE BALANCE OP), Apply 2 drops to eye 2 (two) times daily as needed (dry eyes). , Disp: , Rfl:  sodium chloride (OCEAN) 0.65 % nasal spray, Place 1 spray into the nose as needed for congestion., Disp: , Rfl: ;  traMADol (ULTRAM) 50 MG tablet, Take 50 mg by mouth every 6 (six) hours as needed., Disp: , Rfl: ;  [DISCONTINUED] Calcium Carbonate-Vitamin D (CALCIUM-VITAMIN D) 500-200 MG-UNIT per tablet, Take 1 tablet by mouth 2 (two) times daily with a meal.  , Disp: , Rfl:  [DISCONTINUED] fexofenadine (ALLEGRA) 180 MG tablet, Take 1 tablet (180 mg total) by mouth daily., Disp: 15 tablet, Rfl: 0  EXAM:  Filed Vitals:   08/25/13 0839  BP: 122/82  Pulse: 82  Temp: 97.6 F (36.4 C)    Body mass index is 38.49 kg/(m^2).  GENERAL: vitals reviewed and listed above, alert, oriented, appears well hydrated and in no acute distress  HEENT: atraumatic, conjunttiva clear, no obvious abnormalities on inspection of external nose and ears  NECK: no obvious masses on inspection  LUNGS: clear to auscultation bilaterally, no wheezes, rales or rhonchi, good air movement  CV: HRRR, no peripheral edema  ABD: BS+, soft, NTTP  MS: moves all extremities without noticeable abnormality  PSYCH: pleasant and cooperative, no obvious depression or anxiety  ASSESSMENT AND PLAN:  Discussed the following assessment and plan:  Abdominal discomfort  -benign exam, no symptoms currently, seems she likely simply  felt her bowel moving -return precuations -Patient advised to return or notify a doctor immediately if symptoms worsen or persist or new concerns arise.  Patient Instructions  -eat a healthy diet with plenty of fruits and vegetables  -follow up with your doctor as needed     [name].

## 2013-08-25 NOTE — Progress Notes (Signed)
Pre visit review using our clinic review tool, if applicable. No additional management support is needed unless otherwise documented below in the visit note. 

## 2013-08-25 NOTE — Patient Instructions (Signed)
-  eat a healthy diet with plenty of fruits and vegetables  -follow up with your doctor as needed

## 2013-09-11 ENCOUNTER — Other Ambulatory Visit: Payer: Self-pay | Admitting: Family

## 2013-09-13 ENCOUNTER — Telehealth: Payer: Self-pay | Admitting: Family

## 2013-09-13 NOTE — Telephone Encounter (Signed)
noted 

## 2013-09-13 NOTE — Telephone Encounter (Signed)
Patient Information:  Caller Name: Myan  Phone: [phone]  Patient: Linda, Thompson  Gender: Female  DOB: 1955-03-25  Age: 58 Years  PCP: Roxy Cedar [facility])  Office Follow Up:  Does the office need to follow up with this patient?: No  Instructions For The Office: N/A   Symptoms  Reason For Call & Symptoms: Rectal itching at intervals after stools.  Had stool this am 8/25 and used baby wipe as directed by MD for cleaning, felt some stinging afterward and now itiching.  Has history of yeast infection so does not wear underware while at home/only when going out.  Current itching rated as mild.  Reviewed Health History In EMR: Yes  Reviewed Medications In EMR: Yes  Reviewed Allergies In EMR: Yes  Reviewed Surgeries / Procedures: Yes  Date of Onset of Symptoms: 09/13/2013  Treatments Tried: baby wipes, planning to use her Nixoral cream as directed - not helping yet today  Treatments Tried Worked: No  Guideline(s) Used:  Rectal Symptoms  Disposition Per Guideline:   Home Care  Reason For Disposition Reached:   Mild rectal itching  Advice Given:  Call Back If:  Severe rectal pain or itching  Acute rectal pain due to fecal impaction is not completely relieved after treatment  Rectal pain or itching lasts over 3 days  You become worse.  Patient Will Follow Care Advice:  YES

## 2013-09-19 ENCOUNTER — Ambulatory Visit (INDEPENDENT_AMBULATORY_CARE_PROVIDER_SITE_OTHER): Payer: Medicare Other

## 2013-09-19 DIAGNOSIS — Z23 Encounter for immunization: Secondary | ICD-10-CM

## 2013-10-01 ENCOUNTER — Other Ambulatory Visit: Payer: Self-pay | Admitting: Family

## 2013-10-15 ENCOUNTER — Other Ambulatory Visit: Payer: Self-pay | Admitting: Family Medicine

## 2013-10-17 ENCOUNTER — Other Ambulatory Visit: Payer: Self-pay | Admitting: Family

## 2013-10-17 ENCOUNTER — Telehealth: Payer: Self-pay | Admitting: Family

## 2013-10-17 MED ORDER — KETOCONAZOLE 2 % EX SHAM: 1.0000 "application " | MEDICATED_SHAMPOO | CUTANEOUS | Status: DC

## 2013-10-17 NOTE — Telephone Encounter (Signed)
Cream or shampoo?

## 2013-10-17 NOTE — Telephone Encounter (Signed)
Patient said the pharmacist [facility] [phone] said she did not have anymore refills on her Ketoconazle 2% and she would need approval of doctor to get more refills.

## 2013-10-17 NOTE — Telephone Encounter (Signed)
Sorry, it's shampoo

## 2013-10-25 ENCOUNTER — Ambulatory Visit (INDEPENDENT_AMBULATORY_CARE_PROVIDER_SITE_OTHER): Payer: Medicare Other | Admitting: Family

## 2013-10-25 ENCOUNTER — Encounter: Payer: Self-pay | Admitting: Family

## 2013-10-25 VITALS — BP 120/74 | HR 103 | Temp 97.8°F | Wt 207.0 lb

## 2013-10-25 DIAGNOSIS — R05 Cough: Secondary | ICD-10-CM | POA: Diagnosis not present

## 2013-10-25 DIAGNOSIS — E119 Type 2 diabetes mellitus without complications: Secondary | ICD-10-CM | POA: Diagnosis not present

## 2013-10-25 DIAGNOSIS — J301 Allergic rhinitis due to pollen: Secondary | ICD-10-CM | POA: Diagnosis not present

## 2013-10-25 DIAGNOSIS — R059 Cough, unspecified: Secondary | ICD-10-CM

## 2013-10-25 NOTE — Patient Instructions (Signed)
1. Take Mucinex as needed for cough.  2. Continue Flonase   Allergic Rhinitis Allergic rhinitis is when the mucous membranes in the nose respond to allergens. Allergens are particles in the air that cause your body to have an allergic reaction. This causes you to release allergic antibodies. Through a chain of events, these eventually cause you to release histamine into the blood stream. Although meant to protect the body, it is this release of histamine that causes your discomfort, such as frequent sneezing, congestion, and an itchy, runny nose.  CAUSES  Seasonal allergic rhinitis (hay fever) is caused by pollen allergens that may come from grasses, trees, and weeds. Year-round allergic rhinitis (perennial allergic rhinitis) is caused by allergens such as house dust mites, pet dander, and mold spores.  SYMPTOMS   Nasal stuffiness (congestion).  Itchy, runny nose with sneezing and tearing of the eyes. DIAGNOSIS  Your health care provider can help you determine the allergen or allergens that trigger your symptoms. If you and your health care provider are unable to determine the allergen, skin or blood testing may be used. TREATMENT  Allergic rhinitis does not have a cure, but it can be controlled by:  Medicines and allergy shots (immunotherapy).  Avoiding the allergen. Hay fever may often be treated with antihistamines in pill or nasal spray forms. Antihistamines block the effects of histamine. There are over-the-counter medicines that may help with nasal congestion and swelling around the eyes. Check with your health care provider before taking or giving this medicine.  If avoiding the allergen or the medicine prescribed do not work, there are many new medicines your health care provider can prescribe. Stronger medicine may be used if initial measures are ineffective. Desensitizing injections can be used if medicine and avoidance does not work. Desensitization is when a patient is given ongoing  shots until the body becomes less sensitive to the allergen. Make sure you follow up with your health care provider if problems continue. HOME CARE INSTRUCTIONS It is not possible to completely avoid allergens, but you can reduce your symptoms by taking steps to limit your exposure to them. It helps to know exactly what you are allergic to so that you can avoid your specific triggers. SEEK MEDICAL CARE IF:   You have a fever.  You develop a cough that does not stop easily (persistent).  You have shortness of breath.  You start wheezing.  Symptoms interfere with normal daily activities. Document Released: 10/01/2000 Document Revised: 01/11/2013 Document Reviewed: 09/13/2012 [facility] Patient Information 2015 [place], [place]. This information is not intended to replace advice given to you by your health care provider. Make sure you discuss any questions you have with your health care provider.

## 2013-10-25 NOTE — Progress Notes (Signed)
Pre visit review using our clinic review tool, if applicable. No additional management support is needed unless otherwise documented below in the visit note. 

## 2013-10-25 NOTE — Progress Notes (Signed)
Subjective:    Patient ID: Linda Thompson, female    DOB: 1955-11-23, 58 y.o.   MRN: [id number]  Cough Associated symptoms include postnasal drip. Pertinent negatives include no shortness of breath or wheezing.    56 her old white female, with a history of type 2 diabetes is in today with complaints of a cough that's productive of white phlegm x2 days. Denies any fever, chills, or wheezing. No heartburn or indigestion. Has currently been using Flonase and antihistamines without much relief. Blood glucose has been stable.  Review of Systems  Constitutional: Negative.   HENT: Positive for congestion, postnasal drip and sneezing. Negative for sinus pressure.   Respiratory: Positive for cough. Negative for shortness of breath and wheezing.   Cardiovascular: Negative.   Gastrointestinal: Negative.   Endocrine: Negative.   Genitourinary: Negative.   Musculoskeletal: Negative.   Skin: Negative.   Allergic/Immunologic: Negative.   Neurological: Negative.   Hematological: Negative.   Psychiatric/Behavioral: Negative.    Past Medical History  Diagnosis Date  . Allergic rhinitis, cause unspecified   . Pain in joint, ankle and foot   . Backache, unspecified     chronic LBP with radiculopathy  . Seborrheic dermatitis, unspecified   . Type II or unspecified type diabetes mellitus without mention of complication, not stated as uncontrolled   . History of fracture of arm     Right  . History of fracture of foot   . Dermatomycosis, unspecified   . Esophageal reflux   . Unspecified glaucoma   . Undiagnosed cardiac murmurs   . Diaphragmatic hernia without mention of obstruction or gangrene   . Other and unspecified hyperlipidemia   . Unspecified essential hypertension   . Irritable bowel syndrome   . Benign neoplasm of cerebral meninges   . Moderate intellectual disabilities   . Nausea alone   . Osteoarthrosis, unspecified whether generalized or localized, ankle and foot   . Pruritus  ani   . Unspecified tinnitus   . Nonspecific elevation of levels of transaminase or lactic acid dehydrogenase (LDH)   . Leukorrhea, not specified as infective   . Obesity   . Hyperglycemia   . DDD (degenerative disc disease), lumbar   . Yeast infection of the skin     Frequent    History   Social History  . Marital Status: Single    Spouse Name: N/A    Number of Children: 0  . Years of Education: N/A   Occupational History  . Disabled (from arm fracture)    Social History Main Topics  . Smoking status: Never Smoker   . Smokeless tobacco: Never Used  . Alcohol Use: No  . Drug Use: No  . Sexual Activity: Not Currently   Other Topics Concern  . Not on file   Social History Narrative   Lives alone-with help from neighbors      Currently-disability for arm fracture      [name] (neighbor) is POA    Past Surgical History  Procedure Laterality Date  . Total abdominal hysterectomy    . Cataract extraction    . Elbow surgery      right elbow  . Lipoma excision Left [id number]    posterior left axilla    Family History  Problem Relation Age of Onset  . Diabetes Mother   . Diabetes Maternal Aunt   . Heart disease Maternal Aunt      (had pacemaker)  . Diabetes Maternal Grandmother   . Stroke  Mother   . Stroke Father   . Stroke Maternal Grandmother     Allergies  Allergen Reactions  . Ace Inhibitors Cough  . Amoxicillin-Pot Clavulanate Diarrhea  . Augmentin [Amoxicillin-Pot Clavulanate] Other (See Comments)    diarrhea  . Azithromycin Diarrhea  . Ceftin [Cefuroxime Axetil]     diarrhea  . Cefuroxime Diarrhea and Other (See Comments)    Generalized weakness  . Cyclobenzaprine Hcl Diarrhea  . Flexeril [Cyclobenzaprine] Diarrhea  . Flonase [Fluticasone Propionate] Other (See Comments)    Nose bleed  . Ibuprofen Other (See Comments)    REACTION: vomiting    Current Outpatient Prescriptions on File Prior to Visit  Medication Sig Dispense Refill  .  aspirin EC 81 MG tablet Take 81 mg by mouth daily.      Marland Kitchen desonide (DESOWEN) 0.05 % ointment Apply 1 application topically 2 (two) times daily.      Marland Kitchen diltiazem (CARDIZEM CD) 240 MG 24 hr capsule TAKE 1 CAPSULE (240 MG TOTAL) BY MOUTH DAILY.  90 capsule  3  . hydrochlorothiazide (MICROZIDE) 12.5 MG capsule TAKE 1 CAPSULE (12.5 MG TOTAL) BY MOUTH DAILY.  90 capsule  0  . ipratropium (ATROVENT) 0.06 % nasal spray Place 2 sprays into both nostrils 4 (four) times daily.  15 mL  5  . ketoconazole (NIZORAL) 2 % cream APPLY TOPICALLY DAILY. AS NEEDED TO AFFECTED AREA  60 g  0  . ketoconazole (NIZORAL) 2 % shampoo APPLY TO AFFECTED AREA TWICE A WEEK  120 mL  2  . latanoprost (XALATAN) 0.005 % ophthalmic solution Place 1 drop into both eyes at bedtime.       . metFORMIN (GLUCOPHAGE) 500 MG tablet TAKE 1 TABLET (500 MG TOTAL) BY MOUTH 2 (TWO) TIMES DAILY WITH A MEAL.  180 tablet  0  . nystatin (MYCOSTATIN/NYSTOP) 100000 UNIT/GM POWD Apply to affected areas with yeast three times daily as needed  2 Bottle  3  . omeprazole (PRILOSEC) 20 MG capsule TAKE 1 CAPSULE (20 MG TOTAL) BY MOUTH 2 (TWO) TIMES DAILY.  180 capsule  3  . potassium chloride SA (K-DUR,KLOR-CON) 20 MEQ tablet Take 1 tablet (20 mEq total) by mouth daily.  90 tablet  3  . pravastatin (PRAVACHOL) 40 MG tablet Take 1 tablet (40 mg total) by mouth every evening.  90 tablet  3  . Propylene Glycol (SYSTANE BALANCE OP) Apply 2 drops to eye 2 (two) times daily as needed (dry eyes).       . sodium chloride (OCEAN) 0.65 % nasal spray Place 1 spray into the nose as needed for congestion.      . traMADol (ULTRAM) 50 MG tablet Take 50 mg by mouth every 6 (six) hours as needed.      . [DISCONTINUED] Calcium Carbonate-Vitamin D (CALCIUM-VITAMIN D) 500-200 MG-UNIT per tablet Take 1 tablet by mouth 2 (two) times daily with a meal.        . [DISCONTINUED] diltiazem (TIAZAC) 240 MG 24 hr capsule Take 1 capsule (240 mg total) by mouth daily.  90 capsule  0  .  [DISCONTINUED] fexofenadine (ALLEGRA) 180 MG tablet Take 1 tablet (180 mg total) by mouth daily.  15 tablet  0   No current facility-administered medications on file prior to visit.    BP 120/74  Pulse 103  Temp(Src) 97.8 F (36.6 C) (Oral)  Wt 207 lb (93.895 kg)chart    Objective:   Physical Exam  Constitutional: She is oriented to person, place, and time.  She appears well-developed and well-nourished.  HENT:  Right Ear: External ear normal.  Left Ear: External ear normal.  Nose: Nose normal.  Mouth/Throat: Oropharynx is clear and moist.  Neck: Normal range of motion. Neck supple.  Cardiovascular: Normal rate, regular rhythm and normal heart sounds.   Pulmonary/Chest: Effort normal and breath sounds normal.  Musculoskeletal: Normal range of motion.  Neurological: She is alert and oriented to person, place, and time.  Skin: Skin is warm and dry.  Psychiatric: She has a normal mood and affect.          Assessment & Plan:  Tamecka was seen today for cough.  Diagnoses and associated orders for this visit:  Type 2 diabetes mellitus without complication  Cough  Hay fever    Advised Mucinex 12 hour over-the-counter twice a day until symptoms resolve. Continue Flonase. Call the office with any questions or concerns. Recheck as scheduled and as needed.

## 2013-11-04 DIAGNOSIS — H4011X1 Primary open-angle glaucoma, mild stage: Secondary | ICD-10-CM | POA: Diagnosis not present

## 2013-11-04 DIAGNOSIS — E119 Type 2 diabetes mellitus without complications: Secondary | ICD-10-CM | POA: Diagnosis not present

## 2013-11-04 DIAGNOSIS — Z961 Presence of intraocular lens: Secondary | ICD-10-CM | POA: Diagnosis not present

## 2013-11-08 ENCOUNTER — Ambulatory Visit (INDEPENDENT_AMBULATORY_CARE_PROVIDER_SITE_OTHER): Payer: Medicare Other

## 2013-11-08 ENCOUNTER — Ambulatory Visit: Payer: Medicare Other

## 2013-11-08 DIAGNOSIS — M79673 Pain in unspecified foot: Secondary | ICD-10-CM | POA: Diagnosis not present

## 2013-11-08 DIAGNOSIS — B351 Tinea unguium: Secondary | ICD-10-CM | POA: Diagnosis not present

## 2013-11-08 DIAGNOSIS — E1140 Type 2 diabetes mellitus with diabetic neuropathy, unspecified: Secondary | ICD-10-CM | POA: Diagnosis not present

## 2013-11-08 NOTE — Progress Notes (Signed)
   Subjective:    Patient ID: Linda Thompson, female    DOB: 12/07/1955, 58 y.o.   MRN: [id number]  HPI Pt presents for nail debridement  Review of Systems no new findings or systemic changes noted     Objective:   Physical Exam Neurovascular status is intact although diminished pulses are noted DP and PT one over 4 bilateral capillary refill time 4 seconds all digits decreased sensation to forefoot digits and arch on Semmes Weinstein testing. There is normal plantar response DTRs not listed neurologically skin color pigment normal hair growth absent nails 1 through 5 bilateral thick criptotic incurvated brittle and painful tender symptomatic patient is also been applying topical antifungal toenails as instructed no open wounds or ulcers no secondary infections does have history keratoses is maintain accommodative shoes as instructed is a candidate for new diabetic shoes we'll obtain authorization from her primary physician Dr. Megan Salon for new diabetic shoes at this time.       Assessment & Plan:  Assessment diabetes with history peripheral neuropathy dystrophic nails debrided x10 the presence of diabetes and complications also has digital contractures history keratoses and peripheral neuropathy decreased sensation confirmed was K. for diabetic accident shoes molded inlays we'll obtain authorization from Dr. Megan Salon will be contacted when shoe authorization obtained in the interim recheck in 3 months for continued diabetic foot and palliative nail care as needed next progress Harriet Masson DPM

## 2013-11-14 ENCOUNTER — Telehealth: Payer: Self-pay | Admitting: Family

## 2013-11-14 MED ORDER — IPRATROPIUM BROMIDE 0.06 % NA SOLN: 2.0000 | Freq: Four times a day (QID) | NASAL | Status: DC

## 2013-11-14 NOTE — Telephone Encounter (Signed)
[id number] - [place], [place] - [address] is requesting 90 day re-fill on ipratropium (ATROVENT) 0.06 % nasal spray ° °

## 2013-11-14 NOTE — Telephone Encounter (Signed)
Done

## 2013-11-21 ENCOUNTER — Telehealth: Payer: Self-pay | Admitting: Family

## 2013-11-21 NOTE — Telephone Encounter (Signed)
Patient Information:  Caller Name: Sharrie  Phone: [phone]  Patient: Deliana, Avalos  Gender: Female  DOB: 1955-10-11  Age: 58 Years  PCP: Roxy Cedar [facility])  Office Follow Up:  Does the office need to follow up with this patient?: No  Instructions For The Office: N/A   Symptoms  Reason For Call & Symptoms: Has sore throat since 11-2. Afebrile. Is drinking well. States feels like is taking "a cold" but no congestion or cough.  Reviewed Health History In EMR: Yes  Reviewed Medications In EMR: Yes  Reviewed Allergies In EMR: Yes  Reviewed Surgeries / Procedures: Yes  Date of Onset of Symptoms: 11/21/2013  Treatments Tried: salt water gargle  Treatments Tried Worked: No  Guideline(s) Used:  Sore Throat  Disposition Per Guideline:   See Today in Office  Reason For Disposition Reached:   Diabetes mellitus or weak immune system (e.g., HIV positive, cancer chemo, splenectomy, organ transplant, chronic steroids)  Advice Given:  Warm salt water gargles every 2-3 hours if needed.A  RN Overrode Recommendation:  Follow Up With Office Later  Will be seen in office 11-3. Appointment scheduled for 1400 on 11-3.

## 2013-11-21 NOTE — Telephone Encounter (Signed)
Noted  

## 2013-11-22 ENCOUNTER — Ambulatory Visit (INDEPENDENT_AMBULATORY_CARE_PROVIDER_SITE_OTHER): Payer: Medicare Other | Admitting: Family Medicine

## 2013-11-22 ENCOUNTER — Encounter: Payer: Self-pay | Admitting: Family Medicine

## 2013-11-22 VITALS — BP 102/78 | HR 90 | Temp 97.4°F | Ht 62.0 in | Wt 207.5 lb

## 2013-11-22 DIAGNOSIS — J069 Acute upper respiratory infection, unspecified: Secondary | ICD-10-CM | POA: Diagnosis not present

## 2013-11-22 NOTE — Patient Instructions (Signed)
INSTRUCTIONS FOR UPPER RESPIRATORY INFECTION:  -plenty of rest and fluids  -nasal saline wash 2-3 times daily (use prepackaged nasal saline or bottled/distilled water if making your own)   -in the winter time, using a humidifier at night is helpful (please follow cleaning instructions)  -if you are taking a cough medication - use only as directed, may also try a teaspoon of honey to coat the throat and throat lozenges -musinex is ok to take as needed per instruction on box  -for sore throat, salt water gargles can help  -follow up if you have fevers, facial pain, tooth pain, difficulty breathing or are worsening or not getting better in 5-7 days

## 2013-11-22 NOTE — Progress Notes (Signed)
Pre visit review using our clinic review tool, if applicable. No additional management support is needed unless otherwise documented below in the visit note. 

## 2013-11-22 NOTE — Progress Notes (Signed)
No chief complaint on file.   HPI:  -started:yesterday -symptoms:nasal congestion, tickle in throat, occ cough -denies:fever, SOB, NVD, tooth pain -has tried: musinex but not sure if she is supposed to take it, confused about her flonase instructions from her PCP -sick contacts/travel/risks: denies flu exposure, tick exposure or or Ebola risks -Hx of: allergies ROS: See pertinent positives and negatives per HPI.  Past Medical History  Diagnosis Date  . Allergic rhinitis, cause unspecified   . Pain in joint, ankle and foot   . Backache, unspecified     chronic LBP with radiculopathy  . Seborrheic dermatitis, unspecified   . Type II or unspecified type diabetes mellitus without mention of complication, not stated as uncontrolled   . History of fracture of arm     Right  . History of fracture of foot   . Dermatomycosis, unspecified   . Esophageal reflux   . Unspecified glaucoma   . Undiagnosed cardiac murmurs   . Diaphragmatic hernia without mention of obstruction or gangrene   . Other and unspecified hyperlipidemia   . Unspecified essential hypertension   . Irritable bowel syndrome   . Benign neoplasm of cerebral meninges   . Moderate intellectual disabilities   . Nausea alone   . Osteoarthrosis, unspecified whether generalized or localized, ankle and foot   . Pruritus ani   . Unspecified tinnitus   . Nonspecific elevation of levels of transaminase or lactic acid dehydrogenase (LDH)   . Leukorrhea, not specified as infective   . Obesity   . Hyperglycemia   . DDD (degenerative disc disease), lumbar   . Yeast infection of the skin     Frequent    Past Surgical History  Procedure Laterality Date  . Total abdominal hysterectomy    . Cataract extraction    . Elbow surgery      right elbow  . Lipoma excision Left [id number]    posterior left axilla    Family History  Problem Relation Age of Onset  . Diabetes Mother   . Diabetes Maternal Aunt   . Heart disease  Maternal Aunt      (had pacemaker)  . Diabetes Maternal Grandmother   . Stroke Mother   . Stroke Father   . Stroke Maternal Grandmother     History   Social History  . Marital Status: Single    Spouse Name: N/A    Number of Children: 0  . Years of Education: N/A   Occupational History  . Disabled (from arm fracture)    Social History Main Topics  . Smoking status: Never Smoker   . Smokeless tobacco: Never Used  . Alcohol Use: No  . Drug Use: No  . Sexual Activity: Not Currently   Other Topics Concern  . None   Social History Narrative   Lives alone-with help from neighbors      Currently-disability for arm fracture      [name] (neighbor) is POA    Current outpatient prescriptions: aspirin EC 81 MG tablet, Take 81 mg by mouth daily., Disp: , Rfl: ;  desonide (DESOWEN) 0.05 % ointment, Apply 1 application topically 2 (two) times daily., Disp: , Rfl: ;  diltiazem (CARDIZEM CD) 240 MG 24 hr capsule, TAKE 1 CAPSULE (240 MG TOTAL) BY MOUTH DAILY., Disp: 90 capsule, Rfl: 3;  hydrochlorothiazide (MICROZIDE) 12.5 MG capsule, TAKE 1 CAPSULE (12.5 MG TOTAL) BY MOUTH DAILY., Disp: 90 capsule, Rfl: 0 ipratropium (ATROVENT) 0.06 % nasal spray, Place 2 sprays into both  nostrils 4 (four) times daily., Disp: 15 mL, Rfl: 5;  ketoconazole (NIZORAL) 2 % cream, APPLY TOPICALLY DAILY. AS NEEDED TO AFFECTED AREA, Disp: 60 g, Rfl: 0;  ketoconazole (NIZORAL) 2 % shampoo, APPLY TO AFFECTED AREA TWICE A WEEK, Disp: 120 mL, Rfl: 2;  latanoprost (XALATAN) 0.005 % ophthalmic solution, Place 1 drop into both eyes at bedtime. , Disp: , Rfl:  metFORMIN (GLUCOPHAGE) 500 MG tablet, TAKE 1 TABLET (500 MG TOTAL) BY MOUTH 2 (TWO) TIMES DAILY WITH A MEAL., Disp: 180 tablet, Rfl: 0;  nystatin (MYCOSTATIN/NYSTOP) 100000 UNIT/GM POWD, Apply to affected areas with yeast three times daily as needed, Disp: 2 Bottle, Rfl: 3;  omeprazole (PRILOSEC) 20 MG capsule, TAKE 1 CAPSULE (20 MG TOTAL) BY MOUTH 2 (TWO) TIMES  DAILY., Disp: 180 capsule, Rfl: 3 potassium chloride SA (K-DUR,KLOR-CON) 20 MEQ tablet, Take 1 tablet (20 mEq total) by mouth daily., Disp: 90 tablet, Rfl: 3;  pravastatin (PRAVACHOL) 40 MG tablet, Take 1 tablet (40 mg total) by mouth every evening., Disp: 90 tablet, Rfl: 3;  Propylene Glycol (SYSTANE BALANCE OP), Apply 2 drops to eye 2 (two) times daily as needed (dry eyes). , Disp: , Rfl:  sodium chloride (OCEAN) 0.65 % nasal spray, Place 1 spray into the nose as needed for congestion., Disp: , Rfl: ;  traMADol (ULTRAM) 50 MG tablet, Take 50 mg by mouth every 6 (six) hours as needed., Disp: , Rfl: ;  [DISCONTINUED] Calcium Carbonate-Vitamin D (CALCIUM-VITAMIN D) 500-200 MG-UNIT per tablet, Take 1 tablet by mouth 2 (two) times daily with a meal.  , Disp: , Rfl:  [DISCONTINUED] diltiazem (TIAZAC) 240 MG 24 hr capsule, Take 1 capsule (240 mg total) by mouth daily., Disp: 90 capsule, Rfl: 0;  [DISCONTINUED] fexofenadine (ALLEGRA) 180 MG tablet, Take 1 tablet (180 mg total) by mouth daily., Disp: 15 tablet, Rfl: 0  EXAM:  Filed Vitals:   11/22/13 1352  BP: 102/78  Pulse: 90  Temp: 97.4 F (36.3 C)    Body mass index is 37.94 kg/(m^2).  GENERAL: vitals reviewed and listed above, alert, oriented, appears well hydrated and in no acute distress  HEENT: atraumatic, conjunttiva clear, no obvious abnormalities on inspection of external nose and ears, normal appearance of ear canals and TMs, clear nasal congestion, mild post oropharyngeal erythema with PND, no tonsillar edema or exudate, no sinus TTP  NECK: no obvious masses on inspection  LUNGS: clear to auscultation bilaterally, no wheezes, rales or rhonchi, good air movement  CV: HRRR, no peripheral edema  MS: moves all extremities without noticeable abnormality  PSYCH: pleasant and cooperative, no obvious depression or anxiety  ASSESSMENT AND PLAN:  Discussed the following assessment and plan:  Acute upper respiratory infection  -given  HPI and exam findings today, a serious infection or illness is unlikely. We discussed potential etiologies, with VURI being most likely, and advised supportive care and monitoring. We discussed treatment side effects, likely course, antibiotic misuse, transmission, and signs of developing a serious illness. -of course, we advised to return or notify a doctor immediately if symptoms worsen or persist or new concerns arise.    Patient Instructions  INSTRUCTIONS FOR UPPER RESPIRATORY INFECTION:  -plenty of rest and fluids  -nasal saline wash 2-3 times daily (use prepackaged nasal saline or bottled/distilled water if making your own)   -in the winter time, using a humidifier at night is helpful (please follow cleaning instructions)  -if you are taking a cough medication - use only as directed, may also try  a teaspoon of honey to coat the throat and throat lozenges -musinex is ok to take as needed per instruction on box  -for sore throat, salt water gargles can help  -follow up if you have fevers, facial pain, tooth pain, difficulty breathing or are worsening or not getting better in 5-7 days      KIM, HANNAH R.

## 2013-11-23 ENCOUNTER — Other Ambulatory Visit: Payer: Self-pay | Admitting: Family

## 2013-11-23 ENCOUNTER — Telehealth: Payer: Self-pay | Admitting: Family

## 2013-11-23 ENCOUNTER — Other Ambulatory Visit: Payer: Self-pay | Admitting: Family Medicine

## 2013-11-23 MED ORDER — POTASSIUM CHLORIDE CRYS ER 20 MEQ PO TBCR: 20.0000 meq | EXTENDED_RELEASE_TABLET | Freq: Every day | ORAL | Status: DC

## 2013-11-23 NOTE — Telephone Encounter (Signed)
[id number] - [place], [place] - [address] is requesting re-fill on potassium chloride SA (K-DUR,KLOR-CON) 20 MEQ tablet

## 2013-11-23 NOTE — Telephone Encounter (Signed)
Done

## 2013-12-12 ENCOUNTER — Ambulatory Visit (INDEPENDENT_AMBULATORY_CARE_PROVIDER_SITE_OTHER): Payer: Medicare Other | Admitting: Family

## 2013-12-12 ENCOUNTER — Encounter: Payer: Self-pay | Admitting: Family

## 2013-12-12 VITALS — BP 128/82 | HR 111 | Wt 205.5 lb

## 2013-12-12 DIAGNOSIS — E7800 Pure hypercholesterolemia, unspecified: Secondary | ICD-10-CM

## 2013-12-12 DIAGNOSIS — I10 Essential (primary) hypertension: Secondary | ICD-10-CM

## 2013-12-12 DIAGNOSIS — E119 Type 2 diabetes mellitus without complications: Secondary | ICD-10-CM

## 2013-12-12 DIAGNOSIS — R300 Dysuria: Secondary | ICD-10-CM

## 2013-12-12 DIAGNOSIS — K219 Gastro-esophageal reflux disease without esophagitis: Secondary | ICD-10-CM | POA: Diagnosis not present

## 2013-12-12 LAB — CBC WITH DIFFERENTIAL/PLATELET
Basophils Absolute: 0 K/uL (ref 0.0–0.1)
Basophils Relative: 0.3 % (ref 0.0–3.0)
Eosinophils Absolute: 0.2 K/uL (ref 0.0–0.7)
Eosinophils Relative: 2.4 % (ref 0.0–5.0)
HCT: 38.1 % (ref 36.0–46.0)
Hemoglobin: 11 g/dL — ABNORMAL LOW (ref 12.0–15.0)
Lymphocytes Relative: 19.2 % (ref 12.0–46.0)
Lymphs Abs: 1.3 K/uL (ref 0.7–4.0)
MCHC: 28.8 g/dL — ABNORMAL LOW (ref 30.0–36.0)
MCV: 69.3 fl — ABNORMAL LOW (ref 78.0–100.0)
Monocytes Absolute: 0.6 K/uL (ref 0.1–1.0)
Monocytes Relative: 9.4 % (ref 3.0–12.0)
Neutro Abs: 4.7 K/uL (ref 1.4–7.7)
Neutrophils Relative %: 68.7 % (ref 43.0–77.0)
Platelets: 502 K/uL — ABNORMAL HIGH (ref 150.0–400.0)
RBC: 5.51 Mil/uL — ABNORMAL HIGH (ref 3.87–5.11)
RDW: 18.2 % — ABNORMAL HIGH (ref 11.5–15.5)
WBC: 6.8 K/uL (ref 4.0–10.5)

## 2013-12-12 LAB — LIPID PANEL
CHOL/HDL RATIO: 5
CHOLESTEROL: 141 mg/dL (ref 0–200)
HDL: 28.2 mg/dL — ABNORMAL LOW (ref >39.00)
NonHDL: 112.8
Triglycerides: 266 mg/dL — ABNORMAL HIGH (ref 0.0–149.0)
VLDL: 53.2 mg/dL — ABNORMAL HIGH (ref 0.0–40.0)

## 2013-12-12 LAB — HEPATIC FUNCTION PANEL
ALT: 35 U/L (ref 0–35)
AST: 42 U/L — ABNORMAL HIGH (ref 0–37)
Albumin: 3.7 g/dL (ref 3.5–5.2)
Alkaline Phosphatase: 104 U/L (ref 39–117)
Bilirubin, Direct: 0 mg/dL (ref 0.0–0.3)
Total Bilirubin: 0.2 mg/dL (ref 0.2–1.2)
Total Protein: 7.2 g/dL (ref 6.0–8.3)

## 2013-12-12 LAB — BASIC METABOLIC PANEL WITH GFR
BUN: 13 mg/dL (ref 6–23)
CO2: 27 meq/L (ref 19–32)
Calcium: 8.8 mg/dL (ref 8.4–10.5)
Chloride: 103 meq/L (ref 96–112)
Creatinine, Ser: 0.7 mg/dL (ref 0.4–1.2)
GFR: 95.63 mL/min
Glucose, Bld: 119 mg/dL — ABNORMAL HIGH (ref 70–99)
Potassium: 3.4 meq/L — ABNORMAL LOW (ref 3.5–5.1)
Sodium: 140 meq/L (ref 135–145)

## 2013-12-12 LAB — POCT URINALYSIS DIPSTICK
Bilirubin, UA: NEGATIVE
Blood, UA: NEGATIVE
Nitrite, UA: NEGATIVE
Protein, UA: NEGATIVE
Spec Grav, UA: 1.015
UROBILINOGEN UA: 0.2
pH, UA: 5.5

## 2013-12-12 LAB — HEMOGLOBIN A1C: Hgb A1c MFr Bld: 7.1 % — ABNORMAL HIGH (ref 4.6–6.5)

## 2013-12-12 LAB — LDL CHOLESTEROL, DIRECT: LDL DIRECT: 78.8 mg/dL

## 2013-12-12 NOTE — Addendum Note (Signed)
Addended by: Elmer Picker on: 12/12/2013 02:37 PM   Modules accepted: Orders

## 2013-12-12 NOTE — Progress Notes (Signed)
Subjective:    Patient ID: Linda Thompson, female    DOB: 1955-09-11, 58 y.o.   MRN: [id number]  HPI  58 year old white female, nonsmoker with a history of hyperlipidemia, type 2 diabetes, obesity, GERD, hypertension, and allergic rhinitis is in today for recheck. Reports doing well. Has concerns of occasional burning with urination. Denies any frequency, urgency, blood in her urine or foul smelling urine.  Review of Systems  Constitutional: Negative.   HENT: Negative.   Respiratory: Negative.   Cardiovascular: Negative.   Gastrointestinal: Negative.   Endocrine: Negative.   Genitourinary: Negative.   Musculoskeletal: Negative.   Skin: Negative.   Allergic/Immunologic: Negative.   Neurological: Negative.   Hematological: Negative.   Psychiatric/Behavioral: Negative.    Past Medical History  Diagnosis Date  . Allergic rhinitis, cause unspecified   . Pain in joint, ankle and foot   . Backache, unspecified     chronic LBP with radiculopathy  . Seborrheic dermatitis, unspecified   . Type II or unspecified type diabetes mellitus without mention of complication, not stated as uncontrolled   . History of fracture of arm     Right  . History of fracture of foot   . Dermatomycosis, unspecified   . Esophageal reflux   . Unspecified glaucoma   . Undiagnosed cardiac murmurs   . Diaphragmatic hernia without mention of obstruction or gangrene   . Other and unspecified hyperlipidemia   . Unspecified essential hypertension   . Irritable bowel syndrome   . Benign neoplasm of cerebral meninges   . Moderate intellectual disabilities   . Nausea alone   . Osteoarthrosis, unspecified whether generalized or localized, ankle and foot   . Pruritus ani   . Unspecified tinnitus   . Nonspecific elevation of levels of transaminase or lactic acid dehydrogenase (LDH)   . Leukorrhea, not specified as infective   . Obesity   . Hyperglycemia   . DDD (degenerative disc disease), lumbar   . Yeast  infection of the skin     Frequent    History   Social History  . Marital Status: Single    Spouse Name: N/A    Number of Children: 0  . Years of Education: N/A   Occupational History  . Disabled (from arm fracture)    Social History Main Topics  . Smoking status: Never Smoker   . Smokeless tobacco: Never Used  . Alcohol Use: No  . Drug Use: No  . Sexual Activity: Not Currently   Other Topics Concern  . Not on file   Social History Narrative   Lives alone-with help from neighbors      Currently-disability for arm fracture      [name] (neighbor) is POA    Past Surgical History  Procedure Laterality Date  . Total abdominal hysterectomy    . Cataract extraction    . Elbow surgery      right elbow  . Lipoma excision Left [id number]    posterior left axilla    Family History  Problem Relation Age of Onset  . Diabetes Mother   . Diabetes Maternal Aunt   . Heart disease Maternal Aunt      (had pacemaker)  . Diabetes Maternal Grandmother   . Stroke Mother   . Stroke Father   . Stroke Maternal Grandmother     Allergies  Allergen Reactions  . Ace Inhibitors Cough  . Amoxicillin-Pot Clavulanate Diarrhea  . Augmentin [Amoxicillin-Pot Clavulanate] Other (See Comments)  diarrhea  . Azithromycin Diarrhea  . Ceftin [Cefuroxime Axetil]     diarrhea  . Cefuroxime Diarrhea and Other (See Comments)    Generalized weakness  . Cyclobenzaprine Hcl Diarrhea  . Flexeril [Cyclobenzaprine] Diarrhea  . Flonase [Fluticasone Propionate] Other (See Comments)    Nose bleed  . Ibuprofen Other (See Comments)    REACTION: vomiting    Current Outpatient Prescriptions on File Prior to Visit  Medication Sig Dispense Refill  . aspirin EC 81 MG tablet Take 81 mg by mouth daily.    Marland Kitchen desonide (DESOWEN) 0.05 % ointment Apply 1 application topically 2 (two) times daily.    Marland Kitchen diltiazem (CARDIZEM CD) 240 MG 24 hr capsule TAKE 1 CAPSULE (240 MG TOTAL) BY MOUTH DAILY. 90 capsule 3   . hydrochlorothiazide (MICROZIDE) 12.5 MG capsule TAKE 1 CAPSULE (12.5 MG TOTAL) BY MOUTH DAILY. 90 capsule 0  . ipratropium (ATROVENT) 0.06 % nasal spray Place 2 sprays into both nostrils 4 (four) times daily. 15 mL 5  . ketoconazole (NIZORAL) 2 % cream APPLY TOPICALLY DAILY. AS NEEDED TO AFFECTED AREA 60 g 0  . ketoconazole (NIZORAL) 2 % shampoo APPLY TO AFFECTED AREA TWICE A WEEK 120 mL 2  . latanoprost (XALATAN) 0.005 % ophthalmic solution Place 1 drop into both eyes at bedtime.     . metFORMIN (GLUCOPHAGE) 500 MG tablet TAKE 1 TABLET BY MOUTH TWICE A DAY WITH A MEAL 180 tablet 0  . nystatin (MYCOSTATIN/NYSTOP) 100000 UNIT/GM POWD Apply to affected areas with yeast three times daily as needed 2 Bottle 3  . omeprazole (PRILOSEC) 20 MG capsule TAKE 1 CAPSULE (20 MG TOTAL) BY MOUTH 2 (TWO) TIMES DAILY. 180 capsule 3  . potassium chloride SA (K-DUR,KLOR-CON) 20 MEQ tablet Take 1 tablet (20 mEq total) by mouth daily. 90 tablet 3  . pravastatin (PRAVACHOL) 40 MG tablet TAKE 1 TABLET (40 MG TOTAL) BY MOUTH EVERY EVENING. 90 tablet 2  . Propylene Glycol (SYSTANE BALANCE OP) Apply 2 drops to eye 2 (two) times daily as needed (dry eyes).     . sodium chloride (OCEAN) 0.65 % nasal spray Place 1 spray into the nose as needed for congestion.    . traMADol (ULTRAM) 50 MG tablet Take 50 mg by mouth every 6 (six) hours as needed.    . [DISCONTINUED] Calcium Carbonate-Vitamin D (CALCIUM-VITAMIN D) 500-200 MG-UNIT per tablet Take 1 tablet by mouth 2 (two) times daily with a meal.      . [DISCONTINUED] diltiazem (TIAZAC) 240 MG 24 hr capsule Take 1 capsule (240 mg total) by mouth daily. 90 capsule 0  . [DISCONTINUED] fexofenadine (ALLEGRA) 180 MG tablet Take 1 tablet (180 mg total) by mouth daily. 15 tablet 0   No current facility-administered medications on file prior to visit.    BP 128/82 mmHg  Pulse 111  Wt 205 lb 8 oz (93.214 kg)chart     Objective:   Physical Exam  Constitutional: She is oriented  to person, place, and time. She appears well-developed and well-nourished.  HENT:  Right Ear: External ear normal.  Left Ear: External ear normal.  Nose: Nose normal.  Mouth/Throat: Oropharynx is clear and moist.  Neck: Normal range of motion. Neck supple.  Cardiovascular: Normal rate, regular rhythm and normal heart sounds.   Pulmonary/Chest: Effort normal and breath sounds normal.  Abdominal: Soft. Bowel sounds are normal.  Musculoskeletal: Normal range of motion.  Neurological: She is alert and oriented to person, place, and time.  Skin: Skin is  warm and dry.  Psychiatric: She has a normal mood and affect.          Assessment & Plan:  Ayssa was seen today for follow-up.  Diagnoses and associated orders for this visit:  Essential hypertension, benign - Hemoglobin P7H - Basic Metabolic Panel - Hepatic Function Panel - CBC with Differential - Lipid Panel  Type 2 diabetes mellitus without complication - Hemoglobin K3E - Basic Metabolic Panel - Hepatic Function Panel - CBC with Differential - Lipid Panel - POC Urinalysis Dipstick  Gastroesophageal reflux disease without esophagitis - Hemoglobin X6D - Basic Metabolic Panel - Hepatic Function Panel - CBC with Differential - Lipid Panel  Pure hypercholesterolemia - Hemoglobin Y7W - Basic Metabolic Panel - Hepatic Function Panel - CBC with Differential - Lipid Panel - POC Urinalysis Dipstick    call the office with any questions or concerns. Recheck in 3-4 months and sooner as needed.

## 2013-12-12 NOTE — Patient Instructions (Signed)
Diabetes and Exercise Exercising regularly is important. It is not just about losing weight. It has many health benefits, such as:  Improving your overall fitness, flexibility, and endurance.  Increasing your bone density.  Helping with weight control.  Decreasing your body fat.  Increasing your muscle strength.  Reducing stress and tension.  Improving your overall health. People with diabetes who exercise gain additional benefits because exercise:  Reduces appetite.  Improves the body's use of blood sugar (glucose).  Helps lower or control blood glucose.  Decreases blood pressure.  Helps control blood lipids (such as cholesterol and triglycerides).  Improves the body's use of the hormone insulin by:  Increasing the body's insulin sensitivity.  Reducing the body's insulin needs.  Decreases the risk for heart disease because exercising:  Lowers cholesterol and triglycerides levels.  Increases the levels of good cholesterol (such as high-density lipoproteins [HDL]) in the body.  Lowers blood glucose levels. YOUR ACTIVITY PLAN  Choose an activity that you enjoy and set realistic goals. Your health care provider or diabetes educator can help you make an activity plan that works for you. Exercise regularly as directed by your health care provider. This includes:  Performing resistance training twice a week such as push-ups, sit-ups, lifting weights, or using resistance bands.  Performing 150 minutes of cardio exercises each week such as walking, running, or playing sports.  Staying active and spending no more than 90 minutes at one time being inactive. Even short bursts of exercise are good for you. Three 10-minute sessions spread throughout the day are just as beneficial as a single 30-minute session. Some exercise ideas include:  Taking the dog for a walk.  Taking the stairs instead of the elevator.  Dancing to your favorite song.  Doing an exercise  video.  Doing your favorite exercise with a friend. RECOMMENDATIONS FOR EXERCISING WITH TYPE 1 OR TYPE 2 DIABETES   Check your blood glucose before exercising. If blood glucose levels are greater than 240 mg/dL, check for urine ketones. Do not exercise if ketones are present.  Avoid injecting insulin into areas of the body that are going to be exercised. For example, avoid injecting insulin into:  The arms when playing tennis.  The legs when jogging.  Keep a record of:  Food intake before and after you exercise.  Expected peak times of insulin action.  Blood glucose levels before and after you exercise.  The type and amount of exercise you have done.  Review your records with your health care provider. Your health care provider will help you to develop guidelines for adjusting food intake and insulin amounts before and after exercising.  If you take insulin or oral hypoglycemic agents, watch for signs and symptoms of hypoglycemia. They include:  Dizziness.  Shaking.  Sweating.  Chills.  Confusion.  Drink plenty of water while you exercise to prevent dehydration or heat stroke. Body water is lost during exercise and must be replaced.  Talk to your health care provider before starting an exercise program to make sure it is safe for you. Remember, almost any type of activity is better than none. Document Released: 03/29/2003 Document Revised: 05/23/2013 Document Reviewed: 06/15/2012 [employer] Patient Information 2015 [employer]. This information is not intended to replace advice given to you by your health care provider. Make sure you discuss any questions you have with your health care provider.  

## 2013-12-12 NOTE — Progress Notes (Signed)
Pre visit review using our clinic review tool, if applicable. No additional management support is needed unless otherwise documented below in the visit note. 

## 2013-12-13 ENCOUNTER — Telehealth: Payer: Self-pay | Admitting: Family

## 2013-12-13 ENCOUNTER — Encounter: Payer: Self-pay | Admitting: Family

## 2013-12-13 LAB — URINE CULTURE

## 2013-12-13 NOTE — Telephone Encounter (Signed)
emmi emailed °

## 2013-12-14 ENCOUNTER — Other Ambulatory Visit: Payer: Self-pay | Admitting: Family

## 2013-12-14 MED ORDER — CIPROFLOXACIN HCL 250 MG PO TABS: 250.0000 mg | ORAL_TABLET | Freq: Two times a day (BID) | ORAL | Status: DC

## 2013-12-19 ENCOUNTER — Ambulatory Visit (INDEPENDENT_AMBULATORY_CARE_PROVIDER_SITE_OTHER): Payer: Medicare Other | Admitting: Family Medicine

## 2013-12-19 ENCOUNTER — Encounter: Payer: Self-pay | Admitting: Family Medicine

## 2013-12-19 VITALS — BP 110/80 | HR 106 | Temp 97.3°F | Ht 62.0 in | Wt 204.6 lb

## 2013-12-19 DIAGNOSIS — J069 Acute upper respiratory infection, unspecified: Secondary | ICD-10-CM

## 2013-12-19 NOTE — Progress Notes (Signed)
Pre visit review using our clinic review tool, if applicable. No additional management support is needed unless otherwise documented below in the visit note. 

## 2013-12-19 NOTE — Progress Notes (Signed)
HPI:  -started: 2 days ago -symptoms:nasal congestion, sore throat, cough -denies:fever, SOB, NVD, tooth pain -has tried: nothing -sick contacts/travel/risks: denies flu exposure, tick exposure or or Ebola risks -Hx of: allergies  ROS: See pertinent positives and negatives per HPI.  Past Medical History  Diagnosis Date  . Allergic rhinitis, cause unspecified   . Pain in joint, ankle and foot   . Backache, unspecified     chronic LBP with radiculopathy  . Seborrheic dermatitis, unspecified   . Type II or unspecified type diabetes mellitus without mention of complication, not stated as uncontrolled   . History of fracture of arm     Right  . History of fracture of foot   . Dermatomycosis, unspecified   . Esophageal reflux   . Unspecified glaucoma   . Undiagnosed cardiac murmurs   . Diaphragmatic hernia without mention of obstruction or gangrene   . Other and unspecified hyperlipidemia   . Unspecified essential hypertension   . Irritable bowel syndrome   . Benign neoplasm of cerebral meninges   . Moderate intellectual disabilities   . Nausea alone   . Osteoarthrosis, unspecified whether generalized or localized, ankle and foot   . Pruritus ani   . Unspecified tinnitus   . Nonspecific elevation of levels of transaminase or lactic acid dehydrogenase (LDH)   . Leukorrhea, not specified as infective   . Obesity   . Hyperglycemia   . DDD (degenerative disc disease), lumbar   . Yeast infection of the skin     Frequent    Past Surgical History  Procedure Laterality Date  . Total abdominal hysterectomy    . Cataract extraction    . Elbow surgery      right elbow  . Lipoma excision Left [id number]    posterior left axilla    Family History  Problem Relation Age of Onset  . Diabetes Mother   . Diabetes Maternal Aunt   . Heart disease Maternal Aunt      (had pacemaker)  . Diabetes Maternal Grandmother   . Stroke Mother   . Stroke Father   . Stroke Maternal  Grandmother     History   Social History  . Marital Status: Single    Spouse Name: N/A    Number of Children: 0  . Years of Education: N/A   Occupational History  . Disabled (from arm fracture)    Social History Main Topics  . Smoking status: Never Smoker   . Smokeless tobacco: Never Used  . Alcohol Use: No  . Drug Use: No  . Sexual Activity: Not Currently   Other Topics Concern  . None   Social History Narrative   Lives alone-with help from neighbors      Currently-disability for arm fracture      [name] (neighbor) is POA    Current outpatient prescriptions: aspirin EC 81 MG tablet, Take 81 mg by mouth daily., Disp: , Rfl: ;  ciprofloxacin (CIPRO) 250 MG tablet, Take 1 tablet (250 mg total) by mouth 2 (two) times daily., Disp: 6 tablet, Rfl: 0;  desonide (DESOWEN) 0.05 % ointment, Apply 1 application topically 2 (two) times daily., Disp: , Rfl: ;  diltiazem (CARDIZEM CD) 240 MG 24 hr capsule, TAKE 1 CAPSULE (240 MG TOTAL) BY MOUTH DAILY., Disp: 90 capsule, Rfl: 3 hydrochlorothiazide (MICROZIDE) 12.5 MG capsule, TAKE 1 CAPSULE (12.5 MG TOTAL) BY MOUTH DAILY., Disp: 90 capsule, Rfl: 0;  ipratropium (ATROVENT) 0.06 % nasal spray, Place 2 sprays into  both nostrils 4 (four) times daily., Disp: 15 mL, Rfl: 5;  ketoconazole (NIZORAL) 2 % cream, APPLY TOPICALLY DAILY. AS NEEDED TO AFFECTED AREA, Disp: 60 g, Rfl: 0;  ketoconazole (NIZORAL) 2 % shampoo, APPLY TO AFFECTED AREA TWICE A WEEK, Disp: 120 mL, Rfl: 2 latanoprost (XALATAN) 0.005 % ophthalmic solution, Place 1 drop into both eyes at bedtime. , Disp: , Rfl: ;  metFORMIN (GLUCOPHAGE) 500 MG tablet, TAKE 1 TABLET BY MOUTH TWICE A DAY WITH A MEAL, Disp: 180 tablet, Rfl: 0;  nystatin (MYCOSTATIN/NYSTOP) 100000 UNIT/GM POWD, Apply to affected areas with yeast three times daily as needed, Disp: 2 Bottle, Rfl: 3 omeprazole (PRILOSEC) 20 MG capsule, TAKE 1 CAPSULE (20 MG TOTAL) BY MOUTH 2 (TWO) TIMES DAILY., Disp: 180 capsule, Rfl: 3;   potassium chloride SA (K-DUR,KLOR-CON) 20 MEQ tablet, Take 1 tablet (20 mEq total) by mouth daily., Disp: 90 tablet, Rfl: 3;  pravastatin (PRAVACHOL) 40 MG tablet, TAKE 1 TABLET (40 MG TOTAL) BY MOUTH EVERY EVENING., Disp: 90 tablet, Rfl: 2 Propylene Glycol (SYSTANE BALANCE OP), Apply 2 drops to eye 2 (two) times daily as needed (dry eyes). , Disp: , Rfl: ;  sodium chloride (OCEAN) 0.65 % nasal spray, Place 1 spray into the nose as needed for congestion., Disp: , Rfl: ;  traMADol (ULTRAM) 50 MG tablet, Take 50 mg by mouth every 6 (six) hours as needed., Disp: , Rfl:  [DISCONTINUED] Calcium Carbonate-Vitamin D (CALCIUM-VITAMIN D) 500-200 MG-UNIT per tablet, Take 1 tablet by mouth 2 (two) times daily with a meal.  , Disp: , Rfl: ;  [DISCONTINUED] diltiazem (TIAZAC) 240 MG 24 hr capsule, Take 1 capsule (240 mg total) by mouth daily., Disp: 90 capsule, Rfl: 0;  [DISCONTINUED] fexofenadine (ALLEGRA) 180 MG tablet, Take 1 tablet (180 mg total) by mouth daily., Disp: 15 tablet, Rfl: 0  EXAM:  Filed Vitals:   12/19/13 1044  BP: 110/80  Pulse: 106  Temp: 97.3 F (36.3 C)    Body mass index is 37.41 kg/(m^2).  GENERAL: vitals reviewed and listed above, alert, oriented, appears well hydrated and in no acute distress  HEENT: atraumatic, conjunttiva clear, no obvious abnormalities on inspection of external nose and ears, normal appearance of ear canals and TMs, clear nasal congestion, mild post oropharyngeal erythema with PND, no tonsillar edema or exudate, no sinus TTP  NECK: no obvious masses on inspection  LUNGS: clear to auscultation bilaterally, no wheezes, rales or rhonchi, good air movement  CV: HRRR, no peripheral edema  MS: moves all extremities without noticeable abnormality  PSYCH: pleasant and cooperative, no obvious depression or anxiety  ASSESSMENT AND PLAN:  Discussed the following assessment and plan:  Acute upper respiratory infection  -given HPI and exam findings today, a  serious infection or illness is unlikely. We discussed potential etiologies, with VURI being most likely, and advised supportive care and monitoring. We discussed treatment side effects, likely course, antibiotic misuse, transmission, and signs of developing a serious illness. -of course, we advised to return or notify a doctor immediately if symptoms worsen or persist or new concerns arise.    Patient Instructions  INSTRUCTIONS FOR UPPER RESPIRATORY INFECTION:  -plenty of rest and fluids  -nasal saline wash 2-3 times daily (use prepackaged nasal saline or bottled/distilled water if making your own)   -in the winter time, using a humidifier at night is helpful (please follow cleaning instructions)  -if you are taking a cough medication (musinex) - use only as directed, may also try a teaspoon  of honey to coat the throat and throat lozenges  -for sore throat, salt water gargles can help  -follow up if you have fevers, facial pain, tooth pain, difficulty breathing or are worsening or not getting better in 7 days      KIM, HANNAH R.

## 2013-12-19 NOTE — Patient Instructions (Signed)
INSTRUCTIONS FOR UPPER RESPIRATORY INFECTION:  -plenty of rest and fluids  -nasal saline wash 2-3 times daily (use prepackaged nasal saline or bottled/distilled water if making your own)   -in the winter time, using a humidifier at night is helpful (please follow cleaning instructions)  -if you are taking a cough medication (musinex) - use only as directed, may also try a teaspoon of honey to coat the throat and throat lozenges  -for sore throat, salt water gargles can help  -follow up if you have fevers, facial pain, tooth pain, difficulty breathing or are worsening or not getting better in 7 days

## 2013-12-22 ENCOUNTER — Telehealth: Payer: Self-pay | Admitting: Family

## 2013-12-22 MED ORDER — FLUCONAZOLE 150 MG PO TABS: 150.0000 mg | ORAL_TABLET | Freq: Once | ORAL | Status: DC

## 2013-12-22 NOTE — Telephone Encounter (Signed)
Pt had course of abx for UTI 12/12/13, which can cause yeast inf. Per Abby Potash, send diflucan to pharmacy.  Rx sent and and left message to advise pt to take meds and I will call Monday morning to see how she is doing

## 2013-12-22 NOTE — Telephone Encounter (Signed)
PLEASE NOTE: All timestamps contained within this report are represented as [place] Standard Time. CONFIDENTIALTY NOTICE: This fax transmission is intended only for the addressee. It contains information that is legally privileged, confidential or otherwise protected from use or disclosure. If you are not the intended recipient, you are strictly prohibited from reviewing, disclosing, copying using or disseminating any of this information or taking any action in reliance on or regarding this information. If you have received this fax in error, please notify us immediately by telephone so that we can arrange for its return to [place]. Phone: [phone], Toll-Free: [phone], Fax: [phone] Page: 1 of 2 Call Id: [id number] [facility] Night - Client [place] Patient Name: Linda Thompson Gender: Female DOB: [birth year] Age: 58 Y 79 M 20 D Return Phone Number: [phone] (Primary) Address: [address] City/State/Zip: [name] [address] Client [facility] Night - Client Client Site [facility] - Night Physician Roxy Cedar Contact Type Call Call Type Triage / Clinical Relationship To Patient Self Return Phone Number [phone] (Primary) Chief Complaint Earache Initial Comment Caller states she had an ear infection. States her hearing goes out off and on. PreDisposition InappropriateToAsk Nurse Assessment Nurse: Melina Modena, RN, Estill Bamberg Date/Time Eilene Ghazi Time): 12/21/2013 8:53:13 PM Confirm and document reason for call. If symptomatic, describe symptoms. ---Caller states she had an ear infection. States her hearing goes out off and on. pt is also have vaginal itching and saw PCP on monday for this, gave urine sample for monday for sx, was given meds for infection; no fever; no pain or burning Has the patient traveled out of the country within the last 30 days? ---Not  Applicable Does the patient require triage? ---Yes Related visit to physician within the last 2 weeks? ---Yes Does the PT have any chronic conditions? (i.e. diabetes, asthma, etc.) ---Unknown Guidelines Guideline Title Affirmed Question Affirmed Notes Nurse Date/Time (Eastern Time) Vaginal Symptoms MODERATE-SEVERE itching (i.e., interferes with school, work, or sleep) Melina Modena, RN, Estill Bamberg 12/21/2013 8:58:23 PM Disp. Time Eilene Ghazi Time) Disposition Final User 12/21/2013 9:02:13 PM See Physician within 24 Hours Yes Melina Modena, RN, Shelly Coss Understands: Yes Disagree/Comply: Comply Care Advice Given Per Guideline PLEASE NOTE: All timestamps contained within this report are represented as [place] Standard Time. CONFIDENTIALTY NOTICE: This fax transmission is intended only for the addressee. It contains information that is legally privileged, confidential or otherwise protected from use or disclosure. If you are not the intended recipient, you are strictly prohibited from reviewing, disclosing, copying using or disseminating any of this information or taking any action in reliance on or regarding this information. If you have received this fax in error, please notify us immediately by telephone so that we can arrange for its return to [place]. Phone: [phone], Toll-Free: [phone], Fax: [phone] Page: 2 of 2 Call Id: [id number] Care Advice Given Per Guideline SEE PHYSICIAN WITHIN 24 HOURS: * IF OFFICE WILL BE OPEN: You need to be examined within the next 24 hours. Call your doctor when the office opens, and make an appointment. CLEANSING: Wash the area once thoroughly with un-scented soap and water to remove any irritants. CALL BACK IF: * Fever or severe pain * You become worse. CARE ADVICE given per Vaginal Symptoms (Adult) guideline. Referrals REFERRED TO PCP OFFICE

## 2013-12-23 DIAGNOSIS — M25552 Pain in left hip: Secondary | ICD-10-CM | POA: Diagnosis not present

## 2013-12-23 DIAGNOSIS — M5442 Lumbago with sciatica, left side: Secondary | ICD-10-CM | POA: Diagnosis not present

## 2013-12-26 ENCOUNTER — Ambulatory Visit (INDEPENDENT_AMBULATORY_CARE_PROVIDER_SITE_OTHER): Payer: Medicare Other | Admitting: Family

## 2013-12-26 ENCOUNTER — Encounter: Payer: Self-pay | Admitting: Family

## 2013-12-26 VITALS — BP 128/80 | HR 67 | Wt 203.0 lb

## 2013-12-26 DIAGNOSIS — N898 Other specified noninflammatory disorders of vagina: Secondary | ICD-10-CM

## 2013-12-26 DIAGNOSIS — B373 Candidiasis of vulva and vagina: Secondary | ICD-10-CM

## 2013-12-26 DIAGNOSIS — B3731 Acute candidiasis of vulva and vagina: Secondary | ICD-10-CM

## 2013-12-26 NOTE — Progress Notes (Signed)
Pre visit review using our clinic review tool, if applicable. No additional management support is needed unless otherwise documented below in the visit note. 

## 2013-12-26 NOTE — Patient Instructions (Signed)

## 2013-12-26 NOTE — Progress Notes (Signed)
Subjective:    Patient ID: Linda Thompson, female    DOB: [birth year], 58 y.o.   MRN: [id number]  HPI 58 year old white female, nonsmoker is in today for recheck of vaginal irritation. Lastly, she was treated for vaginal candida that is improved significantly after taking Diflucan 150 mg. She has decreased and does not have any vaginal discharge.   Review of Systems  Constitutional: Negative.   Respiratory: Negative.   Cardiovascular: Negative.   Gastrointestinal: Negative.   Genitourinary: Negative for decreased urine volume, vaginal discharge and vaginal pain.       Vaginal irritation   Musculoskeletal: Negative.   Skin: Negative.   Allergic/Immunologic: Negative.   Neurological: Negative.   Hematological: Negative.   Psychiatric/Behavioral: Negative.    Past Medical History  Diagnosis Date  . Allergic rhinitis, cause unspecified   . Pain in joint, ankle and foot   . Backache, unspecified     chronic LBP with radiculopathy  . Seborrheic dermatitis, unspecified   . Type II or unspecified type diabetes mellitus without mention of complication, not stated as uncontrolled   . History of fracture of arm     Right  . History of fracture of foot   . Dermatomycosis, unspecified   . Esophageal reflux   . Unspecified glaucoma   . Undiagnosed cardiac murmurs   . Diaphragmatic hernia without mention of obstruction or gangrene   . Other and unspecified hyperlipidemia   . Unspecified essential hypertension   . Irritable bowel syndrome   . Benign neoplasm of cerebral meninges   . Moderate intellectual disabilities   . Nausea alone   . Osteoarthrosis, unspecified whether generalized or localized, ankle and foot   . Pruritus ani   . Unspecified tinnitus   . Nonspecific elevation of levels of transaminase or lactic acid dehydrogenase (LDH)   . Leukorrhea, not specified as infective   . Obesity   . Hyperglycemia   . DDD (degenerative disc disease), lumbar   . Yeast infection of  the skin     Frequent    History   Social History  . Marital Status: Single    Spouse Name: N/A    Number of Children: 0  . Years of Education: N/A   Occupational History  . Disabled (from arm fracture)    Social History Main Topics  . Smoking status: Never Smoker   . Smokeless tobacco: Never Used  . Alcohol Use: No  . Drug Use: No  . Sexual Activity: Not Currently   Other Topics Concern  . Not on file   Social History Narrative   Lives alone-with help from neighbors      Currently-disability for arm fracture      [name] (neighbor) is POA    Past Surgical History  Procedure Laterality Date  . Total abdominal hysterectomy    . Cataract extraction    . Elbow surgery      right elbow  . Lipoma excision Left [id number]    posterior left axilla    Family History  Problem Relation Age of Onset  . Diabetes Mother   . Diabetes Maternal Aunt   . Heart disease Maternal Aunt      (had pacemaker)  . Diabetes Maternal Grandmother   . Stroke Mother   . Stroke Father   . Stroke Maternal Grandmother     Allergies  Allergen Reactions  . Ace Inhibitors Cough  . Amoxicillin-Pot Clavulanate Diarrhea  . Augmentin [Amoxicillin-Pot Clavulanate] Other (See Comments)  diarrhea  . Azithromycin Diarrhea  . Ceftin [Cefuroxime Axetil]     diarrhea  . Cefuroxime Diarrhea and Other (See Comments)    Generalized weakness  . Cyclobenzaprine Hcl Diarrhea  . Flexeril [Cyclobenzaprine] Diarrhea  . Flonase [Fluticasone Propionate] Other (See Comments)    Nose bleed  . Ibuprofen Other (See Comments)    REACTION: vomiting    Current Outpatient Prescriptions on File Prior to Visit  Medication Sig Dispense Refill  . aspirin EC 81 MG tablet Take 81 mg by mouth daily.    Marland Kitchen desonide (DESOWEN) 0.05 % ointment Apply 1 application topically 2 (two) times daily.    Marland Kitchen diltiazem (CARDIZEM CD) 240 MG 24 hr capsule TAKE 1 CAPSULE (240 MG TOTAL) BY MOUTH DAILY. 90 capsule 3  .  hydrochlorothiazide (MICROZIDE) 12.5 MG capsule TAKE 1 CAPSULE (12.5 MG TOTAL) BY MOUTH DAILY. 90 capsule 0  . ipratropium (ATROVENT) 0.06 % nasal spray Place 2 sprays into both nostrils 4 (four) times daily. 15 mL 5  . ketoconazole (NIZORAL) 2 % cream APPLY TOPICALLY DAILY. AS NEEDED TO AFFECTED AREA 60 g 0  . ketoconazole (NIZORAL) 2 % shampoo APPLY TO AFFECTED AREA TWICE A WEEK 120 mL 2  . latanoprost (XALATAN) 0.005 % ophthalmic solution Place 1 drop into both eyes at bedtime.     . metFORMIN (GLUCOPHAGE) 500 MG tablet TAKE 1 TABLET BY MOUTH TWICE A DAY WITH A MEAL 180 tablet 0  . nystatin (MYCOSTATIN/NYSTOP) 100000 UNIT/GM POWD Apply to affected areas with yeast three times daily as needed 2 Bottle 3  . omeprazole (PRILOSEC) 20 MG capsule TAKE 1 CAPSULE (20 MG TOTAL) BY MOUTH 2 (TWO) TIMES DAILY. 180 capsule 3  . potassium chloride SA (K-DUR,KLOR-CON) 20 MEQ tablet Take 1 tablet (20 mEq total) by mouth daily. 90 tablet 3  . pravastatin (PRAVACHOL) 40 MG tablet TAKE 1 TABLET (40 MG TOTAL) BY MOUTH EVERY EVENING. 90 tablet 2  . Propylene Glycol (SYSTANE BALANCE OP) Apply 2 drops to eye 2 (two) times daily as needed (dry eyes).     . sodium chloride (OCEAN) 0.65 % nasal spray Place 1 spray into the nose as needed for congestion.    . traMADol (ULTRAM) 50 MG tablet Take 50 mg by mouth every 6 (six) hours as needed.    . [DISCONTINUED] Calcium Carbonate-Vitamin D (CALCIUM-VITAMIN D) 500-200 MG-UNIT per tablet Take 1 tablet by mouth 2 (two) times daily with a meal.      . [DISCONTINUED] diltiazem (TIAZAC) 240 MG 24 hr capsule Take 1 capsule (240 mg total) by mouth daily. 90 capsule 0  . [DISCONTINUED] fexofenadine (ALLEGRA) 180 MG tablet Take 1 tablet (180 mg total) by mouth daily. 15 tablet 0   No current facility-administered medications on file prior to visit.    BP 128/80 mmHg  Pulse 67  Wt 203 lb (92.08 kg)chart    Objective:   Physical Exam  Constitutional: She is oriented to person,  place, and time. She appears well-developed.  Neck: Normal range of motion. Neck supple.  Cardiovascular: Normal rate, regular rhythm and normal heart sounds.   Pulmonary/Chest: Effort normal and breath sounds normal.  Abdominal: Soft. Bowel sounds are normal.  Neurological: She is alert and oriented to person, place, and time.  Skin: Skin is warm and dry.  Psychiatric: She has a normal mood and affect.          Assessment & Plan:  Alec was seen today for no specified reason.  Diagnoses and  associated orders for this visit:  Vaginal irritation  Candida vaginitis    keep the vaginal area clean and dry. call the office with any questions or concerns.

## 2013-12-30 ENCOUNTER — Ambulatory Visit (INDEPENDENT_AMBULATORY_CARE_PROVIDER_SITE_OTHER): Payer: Medicare Other | Admitting: Family Medicine

## 2013-12-30 ENCOUNTER — Encounter: Payer: Self-pay | Admitting: Family Medicine

## 2013-12-30 ENCOUNTER — Telehealth: Payer: Self-pay | Admitting: Family

## 2013-12-30 ENCOUNTER — Other Ambulatory Visit: Payer: Self-pay | Admitting: Family Medicine

## 2013-12-30 VITALS — BP 106/93 | HR 95 | Temp 97.9°F | Ht 62.0 in | Wt 203.0 lb

## 2013-12-30 DIAGNOSIS — H6982 Other specified disorders of Eustachian tube, left ear: Secondary | ICD-10-CM | POA: Diagnosis not present

## 2013-12-30 NOTE — Telephone Encounter (Signed)
Per PCP ok to shchedule appt with any available provider.  Appt scheduled with Dr. Sarajane Jews today at 2pm.

## 2013-12-30 NOTE — Telephone Encounter (Signed)
PLEASE NOTE: All timestamps contained within this report are represented as [place] Standard Time. CONFIDENTIALTY NOTICE: This fax transmission is intended only for the addressee. It contains information that is legally privileged, confidential or otherwise protected from use or disclosure. If you are not the intended recipient, you are strictly prohibited from reviewing, disclosing, copying using or disseminating any of this information or taking any action in reliance on or regarding this information. If you have received this fax in error, please notify us immediately by telephone so that we can arrange for its return to [place]. Phone: [phone], Toll-Free: [phone], Fax: [phone] Page: 1 of 2 Call Id: [id number] [name] Patient Name: Linda Thompson Gender: Female DOB: 01/30/1956 Age: 58 Y 94 M Return Phone Number: [phone] (Primary) Address: [name] Client [name] Physician Shawna Orleans, Celina Type Fax Call Type Triage / Clinical Relationship To Patient Self Return Phone Number [phone] (Primary) Chief Complaint Dizziness Initial Comment Caller states has been experiencing dizziness since last week. PreDisposition Call Doctor Nurse Assessment Nurse: Vallery Sa, RN, Tye [place] Date/Time Eilene Ghazi Time): 12/29/2013 11:52:39 AM Confirm and document reason for call. If symptomatic, describe symptoms. ---Caller states she has had dizziness the past week. No fever. No injury in the past few days. Has the patient traveled out of the country within the last 30 days? ---No Does the patient require triage? ---Yes Related visit to physician within the last 2 weeks? ---No Does the PT have any chronic conditions? (i.e. diabetes, asthma, etc.) ---Yes List chronic  conditions. ---High Blood Pressure (normal) , Diabetes (blood sugars normal), GERD, Guidelines Guideline Title Affirmed Question Affirmed Notes Nurse Date/Time (Eastern Time) Dizziness - Lightheadedness [1] MODERATE dizziness (e.g., interferes with normal activities) AND [2] has NOT been evaluated by physician for this (Exception: dizziness caused by heat exposure, sudden standing, or poor fluid intake) Tr[place]ull, RN, [facility] 12/29/2013 11:54:12 AM Disp. Time Eilene Ghazi Time) Disposition Final User 12/29/2013 11:57:21 AM See Physician within 24 Hours Yes Trumbull, RN, Tye [place] PLEASE NOTE: All timestamps contained within this report are represented as [place] Standard Time. CONFIDENTIALTY NOTICE: This fax transmission is intended only for the addressee. It contains information that is legally privileged, confidential or otherwise protected from use or disclosure. If you are not the intended recipient, you are strictly prohibited from reviewing, disclosing, copying using or disseminating any of this information or taking any action in reliance on or regarding this information. If you have received this fax in error, please notify us immediately by telephone so that we can arrange for its return to [place]. Phone: [phone], Toll-Free: [phone], Fax: [phone] Page: 2 of 2 Call Id: [phone] Caller Understands: Yes Disagree/Comply: Comply Care Advice Given Per Guideline SEE PHYSICIAN WITHIN 24 HOURS: * IF OFFICE WILL BE OPEN: You need to be examined within the next 24 hours. Call your doctor when the office opens, and make an appointment. FLUIDS: Drink several glasses of fruit juice, other clear fluids or water. This will improve hydration and blood glucose. If the weather is hot, make sure the fluids are cold. REST: Lie down with feet elevated for 1 hour. This will improve circulation and increase blood flow to the brain. CALL BACK IF: * Passes out (faints) * You become worse. CARE  ADVICE given per Dizziness (Adult) guideline. After Care Instructions Given Call Event Type User Date /  Time Description Comments User: Berton Mount, RN Date/Time Eilene Ghazi Time): 12/29/2013 12:00:29 PM Called the Appointment Line and Constance Holster will assist [place] with appointment options. Referrals REFERRED TO PCP OFFICE

## 2013-12-30 NOTE — Telephone Encounter (Signed)
Pt called this morning requesting appt to be seen about her ear today, advised pt no appts available with PCP today, that I would send a message back to see what PCP recommends her to do.

## 2013-12-30 NOTE — Telephone Encounter (Signed)
This encounter was created in error - please disregard.

## 2013-12-30 NOTE — Telephone Encounter (Signed)
Schedule with any available provider

## 2013-12-30 NOTE — Telephone Encounter (Signed)
PLEASE NOTE: All timestamps contained within this report are represented as [place] Standard Time. CONFIDENTIALTY NOTICE: This fax transmission is intended only for the addressee. It contains information that is legally privileged, confidential or otherwise protected from use or disclosure. If you are not the intended recipient, you are strictly prohibited from reviewing, disclosing, copying using or disseminating any of this information or taking any action in reliance on or regarding this information. If you have received this fax in error, please notify us immediately by telephone so that we can arrange for its return to [place]. Phone: [phone], Toll-Free: [phone], Fax: [phone] Page: 1 of 2 Call Id: [id number] [facility] - Client [place] Patient Name: Linda Thompson Gender: Female DOB: [birth year] Age: 58 Y 57 M 28 D Return Phone Number: [phone] (Primary) Address: [address] City/State/Zip: [name] [address] Client [facility] - Client Client Site [facility] - Night Physician Roxy Cedar Contact Type Call Call Type Triage / Clinical Relationship To Patient Self Return Phone Number [phone] (Primary) Chief Complaint Ear Fullness or Congestion Initial Comment Caller states when she drinks fluid her left ear clogs up. PreDisposition Did not know what to do Nurse Assessment Nurse: Doyle Askew, RN, Joelene Millin Date/Time Eilene Ghazi Time): 12/29/2013 7:47:16 PM Confirm and document reason for call. If symptomatic, describe symptoms. ---Caller states when she drinks fluid her left ear clogs up. Noticed it a while ago. no fever. just a popping sounds but not affecting hearing. no painful Has the patient traveled out of the country within the last 30 days? ---Not Applicable Does the patient require triage? ---Yes Related visit to physician within the  last 2 weeks? ---No Does the PT have any chronic conditions? (i.e. diabetes, asthma, etc.) ---Yes List chronic conditions. ---hypertension, diabetic (oral), hiatal hernia Guidelines Guideline Title Affirmed Question Affirmed Notes Nurse Date/Time Eilene Ghazi Time) Ear - Congestion Ear congestion present > 48 hours Doyle Askew, RN, Joelene Millin 12/29/2013 7:55:16 PM Disp. Time Eilene Ghazi Time) Disposition Final User 12/29/2013 8:03:57 PM See PCP When Office is Open (within 3 days) Yes Doyle Askew, RN, Renea Ee Understands: Yes Disagree/Comply: Comply Care Advice Given Per Guideline PLEASE NOTE: All timestamps contained within this report are represented as [place] Standard Time. CONFIDENTIALTY NOTICE: This fax transmission is intended only for the addressee. It contains information that is legally privileged, confidential or otherwise protected from use or disclosure. If you are not the intended recipient, you are strictly prohibited from reviewing, disclosing, copying using or disseminating any of this information or taking any action in reliance on or regarding this information. If you have received this fax in error, please notify us immediately by telephone so that we can arrange for its return to [place]. Phone: [phone], Toll-Free: [phone], Fax: [phone] Page: 2 of 2 Call Id: [id number] Care Advice Given Per Guideline SEE PCP WITHIN 3 DAYS: You need to be examined within 2 or 3 days. Call your doctor during regular office hours and make an appointment. (Note: if office will be open tomorrow, tell caller to call then, not in 3 days). CALL BACK IF: * Severe ear pain occurs * You become worse. CARE ADVICE given per Ear - Congestion (Adult) guideline. TREATMENT - CHEWING AND SWALLOWING: * Try chewing gum. * You can also try swallowing water while pinching your nostrils closed. The reason this works is that it creates a small vacuum in the nose. This helps the eustachian tube to open up. * If  chewing or swallowing doesn't help after 1 or 2 hours, you can try using an over-the-counter (OTC) nasal decongestant drops. You can use the nose drops twice a day. TREATMENT - DECONGESTANT NASAL SPRAY: * Oxymetazoline Nasal Drops (e.g., Afrin): Available OTC. Clean out the nose before using. Spray each nostril once, wait one minute for absorption, and then spray a second time. * Do not take these medications if you have high blood pressure, heart disease, prostate enlargement, or an overactive thyroid. * LORATADINE is a newer (second generation) antihistamine. The dosage of loratadine (e.g., OTC Claritin, Alavert) is 10 mg once a day. * CETIRIZINE is a newer (second generation) antihistamine. The dosage of cetirizine (e.g., OTC Zyrtec) is 10 mg once a day. TREATMENT - ANTIHISTAMINE MEDICATIONS FOR HAYFEVER: After Care Instructions Given Call Event Type User Date / Time Description

## 2013-12-30 NOTE — Progress Notes (Signed)
   Subjective:    Patient ID: Linda Thompson, female    DOB: Jan 10, 1956, 58 y.o.   MRN: [id number]  HPI Here for 2 days of fullness and popping in the left ear. No pain. Her hearing is normal.   Review of Systems  Constitutional: Negative.   HENT: Negative for congestion, ear discharge, ear pain, facial swelling, hearing loss, postnasal drip and sinus pressure.   Eyes: Negative.   Respiratory: Negative.        Objective:   Physical Exam  Constitutional: She appears well-developed and well-nourished.  HENT:  Right Ear: External ear normal.  Left Ear: External ear normal.  Nose: Nose normal.  Mouth/Throat: Oropharynx is clear and moist.  Eyes: Conjunctivae are normal.  Lymphadenopathy:    She has no cervical adenopathy.          Assessment & Plan:  Try taking Claritin daily

## 2013-12-30 NOTE — Telephone Encounter (Signed)
Please advise 

## 2013-12-30 NOTE — Telephone Encounter (Signed)
Pt has been sch with dr fry today at 2 pm

## 2013-12-30 NOTE — Progress Notes (Signed)
Pre visit review using our clinic review tool, if applicable. No additional management support is needed unless otherwise documented below in the visit note. 

## 2013-12-30 NOTE — Addendum Note (Signed)
Addended by: Colleen Can on: 12/30/2013 09:50 AM   Modules accepted: Level of Service, SmartSet

## 2014-01-04 ENCOUNTER — Ambulatory Visit (INDEPENDENT_AMBULATORY_CARE_PROVIDER_SITE_OTHER): Payer: Medicare Other | Admitting: Family Medicine

## 2014-01-04 ENCOUNTER — Ambulatory Visit: Payer: Medicare Other | Admitting: Family

## 2014-01-04 ENCOUNTER — Encounter: Payer: Self-pay | Admitting: Family Medicine

## 2014-01-04 VITALS — BP 110/80 | HR 96 | Temp 97.4°F | Wt 204.0 lb

## 2014-01-04 DIAGNOSIS — L290 Pruritus ani: Secondary | ICD-10-CM | POA: Diagnosis not present

## 2014-01-04 NOTE — Patient Instructions (Signed)
Clean anal area thoroughly after bowel movements-then dry area completely Continue hydrocortisone 1% cream as needed for relief of itching

## 2014-01-04 NOTE — Progress Notes (Signed)
Pre visit review using our clinic review tool, if applicable. No additional management support is needed unless otherwise documented below in the visit note. 

## 2014-01-04 NOTE — Progress Notes (Signed)
Subjective:    Patient ID: Linda Thompson, female    DOB: 1955/12/01, 58 y.o.   MRN: [id number]  HPI Acute visit. Patient is a poor historian. She presents with apparently 2 day history of some rectal irritation with itching perianal region. She does have type 2 diabetes. She's not had any diarrhea or stool changes. She tried ketoconazole cream without improvement. Hydrocortisone 1% over-the-counter with mild relief. She uses baby wipes after stools. She is avoiding scented toilet paper. She was just treated with Diflucan for presumed yeast vaginitis. She denies any vaginal irritation this time.  Past Medical History  Diagnosis Date  . Allergic rhinitis, cause unspecified   . Pain in joint, ankle and foot   . Backache, unspecified     chronic LBP with radiculopathy  . Seborrheic dermatitis, unspecified   . Type II or unspecified type diabetes mellitus without mention of complication, not stated as uncontrolled   . History of fracture of arm     Right  . History of fracture of foot   . Dermatomycosis, unspecified   . Esophageal reflux   . Unspecified glaucoma   . Undiagnosed cardiac murmurs   . Diaphragmatic hernia without mention of obstruction or gangrene   . Other and unspecified hyperlipidemia   . Unspecified essential hypertension   . Irritable bowel syndrome   . Benign neoplasm of cerebral meninges   . Moderate intellectual disabilities   . Nausea alone   . Osteoarthrosis, unspecified whether generalized or localized, ankle and foot   . Pruritus ani   . Unspecified tinnitus   . Nonspecific elevation of levels of transaminase or lactic acid dehydrogenase (LDH)   . Leukorrhea, not specified as infective   . Obesity   . Hyperglycemia   . DDD (degenerative disc disease), lumbar   . Yeast infection of the skin     Frequent   Past Surgical History  Procedure Laterality Date  . Total abdominal hysterectomy    . Cataract extraction    . Elbow surgery      right elbow  .  Lipoma excision Left [id number]    posterior left axilla    reports that she has never smoked. She has never used smokeless tobacco. She reports that she does not drink alcohol or use illicit drugs. family history includes Diabetes in her maternal aunt, maternal grandmother, and mother; Heart disease in her maternal aunt; Stroke in her father, maternal grandmother, and mother. Allergies  Allergen Reactions  . Ace Inhibitors Cough  . Amoxicillin-Pot Clavulanate Diarrhea  . Augmentin [Amoxicillin-Pot Clavulanate] Other (See Comments)    diarrhea  . Azithromycin Diarrhea  . Ceftin [Cefuroxime Axetil]     diarrhea  . Cefuroxime Diarrhea and Other (See Comments)    Generalized weakness  . Cyclobenzaprine Hcl Diarrhea  . Flexeril [Cyclobenzaprine] Diarrhea  . Flonase [Fluticasone Propionate] Other (See Comments)    Nose bleed  . Ibuprofen Other (See Comments)    REACTION: vomiting      Review of Systems  Constitutional: Negative for fever and chills.  Gastrointestinal: Negative for diarrhea, constipation, blood in stool, anal bleeding and rectal pain.       Objective:   Physical Exam  Constitutional: She appears well-developed and well-nourished.  Cardiovascular: Normal rate and regular rhythm.   Pulmonary/Chest: Effort normal and breath sounds normal. No respiratory distress. She has no wheezes. She has no rales.  Genitourinary:  Anal exam reveals no erythema. No rash. No anal fissure. No bleeding. No external hemorrhoids.  Assessment & Plan:   Perianal irritation. She complains of some itching but does not have evidence to suggest active yeast infection. No active dermatitis findings on exam.  Keep area clean. Continue hydrocortisone cream which seems to be helping.

## 2014-01-10 ENCOUNTER — Telehealth: Payer: Self-pay

## 2014-01-10 ENCOUNTER — Telehealth: Payer: Self-pay | Admitting: Family

## 2014-01-10 MED ORDER — FLUCONAZOLE 150 MG PO TABS: 150.0000 mg | ORAL_TABLET | Freq: Once | ORAL | Status: DC

## 2014-01-10 NOTE — Telephone Encounter (Signed)
Done

## 2014-01-10 NOTE — Telephone Encounter (Signed)
[facility] Night - Client [facility] Patient Name: Linda Thompson Gender: Female DOB: [birth year] Age: 58 Y 72 M 8 D Return Phone Number: [phone] (Primary) Address: [address] City/State/Zip: [name] [address] Client [facility] Night - Client Client Site [facility] - Night Physician Roxy Cedar Contact Type Call Call Type Triage / Clinical Relationship To Patient Self Return Phone Number [phone] (Primary) Chief Complaint Infection Exposure Initial Comment Caller states she has an infection and it's been going on for about a month. She wants a second opinion. PreDisposition Call Doctor Nurse Assessment Nurse: Butler Denmark Date/Time Eilene Ghazi Time): 01/09/2014 5:50:29 PM Confirm and document reason for call. If symptomatic, describe symptoms. ---Caller states she has an infection and it's been going on for about a month. Caller is having some vaginal itching after being on Abx Has the patient traveled out of the country within the last 30 days? ---Not Applicable Does the patient require triage? ---Yes Related visit to physician within the last 2 weeks? ---No Does the PT have any chronic conditions? (i.e. diabetes, asthma, etc.) ---Yes List chronic conditions. ---DM Heart murmur Guidelines Guideline Title Affirmed Question Affirmed Notes Nurse Date/Time (Eastern Time) Vaginal Symptoms MODERATE-SEVERE itching (i.e., interferes with school, work, or sleep) Butler Denmark 01/09/2014 5:56:45 PM Disp. Time Eilene Ghazi Time) Disposition Final User 01/09/2014 5:59:28 PM See Physician within 24 Hours Yes Huckaby, Signa Kell Understands: Yes Disagree/Comply: Comply PLEASE NOTE: All timestamps contained within this report are represented as [place] Standard Time. CONFIDENTIALTY NOTICE: This fax transmission is intended only for the addressee. It  contains information that is legally privileged, confidential or otherwise protected from use or disclosure. If you are not the intended recipient, you are strictly prohibited from reviewing, disclosing, copying using or disseminating any of this information or taking any action in reliance on or regarding this information. If you have received this fax in error, please notify us immediately by telephone so that we can arrange for its return to [place]. Phone: [phone], Toll-Free: [phone], Fax: [phone] Page: 2 of 2 Call Id: [id number] Care Advice Given Per Guideline SEE PHYSICIAN WITHIN 24 HOURS: * IF OFFICE WILL BE OPEN: You need to be examined within the next 24 hours. Call your doctor when the office opens, and make an appointment. CLEANSING: Wash the area once thoroughly with un-scented soap and water to remove any irritants. ANTIHISTAMINE FOR SEVERE ITCHING: * Take an antihistamine by mouth to reduce the itching. Diphenhydramine (OTC Benadryl) is a good choice. Adult dose is 25-50 mg. Take it up to 4 times a day. CAUTION - ANTIHISTAMINES: * Examples include diphenhydramine (Benadryl) and chlorpheniramine (Chlortrimeton, Chlor-tripolon) * May cause sleepiness. Do not drink alcohol, drive or operate dangerous machinery while taking antihistamines. * Read the package instructions and warnings. DO NOT: * DO NOT DOUCHE (Reason: douching does not help vaginitis, and may actually make it worse). * DO NOT USE ANY VAGINAL CREAMS during the 24 hours before your physician appointment (Reason: interferes with examination). CALL BACK IF: * Fever or severe pain * You become worse. CARE ADVICE given per Vaginal Symptoms (Adult) guideline. After Care Instructions Given Call Event Type User Date / Time Description Referrals REFERRED TO PCP OFFICE REFERRED TO PCP OFFICE

## 2014-01-10 NOTE — Telephone Encounter (Signed)
Pt has yeast inf and would like diflucan [facility]

## 2014-01-10 NOTE — Telephone Encounter (Signed)
Diflucan sent to pharmacy for pt, per [facility]

## 2014-01-11 NOTE — Telephone Encounter (Signed)
Pt is still taking diflucan and will call back if itching does not stop.

## 2014-01-12 ENCOUNTER — Telehealth: Payer: Self-pay | Admitting: Family

## 2014-01-12 NOTE — Telephone Encounter (Signed)
Rx was filled on 01/10/14 with 1 refill.  No refill will be provided

## 2014-01-12 NOTE — Telephone Encounter (Signed)
Spoke with pt to advise that she needs to allot the proper amount of time for a single pill to work. Advised that she should not need to take another diflucan as she has already taken the prescribed amount. If sxs worsen or persist, she is advised to contact the office next week

## 2014-01-12 NOTE — Telephone Encounter (Signed)
Pt request refill of the following:fluconazole (DIFLUCAN) 150 MG tablet       Phamacy: [facility]

## 2014-01-16 ENCOUNTER — Other Ambulatory Visit (HOSPITAL_COMMUNITY)
Admission: RE | Admit: 2014-01-16 | Discharge: 2014-01-16 | Disposition: A | Discharge status: Home / Self Care | Payer: Medicare Other | Source: Ambulatory Visit | Attending: Family | Admitting: Family

## 2014-01-16 ENCOUNTER — Ambulatory Visit (INDEPENDENT_AMBULATORY_CARE_PROVIDER_SITE_OTHER): Payer: Medicare Other | Admitting: Family

## 2014-01-16 ENCOUNTER — Telehealth: Payer: Self-pay | Admitting: *Deleted

## 2014-01-16 ENCOUNTER — Encounter: Payer: Self-pay | Admitting: Family

## 2014-01-16 VITALS — BP 120/80 | HR 80 | Temp 98.4°F | Wt 202.0 lb

## 2014-01-16 DIAGNOSIS — Z113 Encounter for screening for infections with a predominantly sexual mode of transmission: Secondary | ICD-10-CM | POA: Insufficient documentation

## 2014-01-16 DIAGNOSIS — N760 Acute vaginitis: Secondary | ICD-10-CM

## 2014-01-16 DIAGNOSIS — Z124 Encounter for screening for malignant neoplasm of cervix: Secondary | ICD-10-CM | POA: Insufficient documentation

## 2014-01-16 DIAGNOSIS — Z1272 Encounter for screening for malignant neoplasm of vagina: Secondary | ICD-10-CM | POA: Diagnosis not present

## 2014-01-16 MED ORDER — TERCONAZOLE 0.8 % VA CREA: 1.0000 | TOPICAL_CREAM | Freq: Every day | VAGINAL | Status: DC

## 2014-01-16 NOTE — Telephone Encounter (Signed)
[facility] - Client [facility] Patient Name: Linda Thompson Gender: Female DOB: 1955/05/28 Age: 58 Y 62 M 15 D Return Phone Number: [phone] (Primary) Address: [address] City/State/Zip: [name] [address] Client [facility] Night - Client Client Site [facility] - Night Physician Roxy Cedar Contact Type Call Call Type Triage / Clinical Relationship To Patient Self Return Phone Number [phone] (Primary) Chief Complaint Itching Initial Comment Caller states she had a yeast infection and she is itching more. Is taking Fluconazole(sp) 155ml PreDisposition Did not know what to do Nurse Assessment Nurse: Martyn Ehrich, RN, [facility] Date/Time (Eastern Time): 01/15/2014 6:50:23 PM Confirm and document reason for call. If symptomatic, describe symptoms. ---Pt has itching in vulva area and she saw Dr. Megan Salon on Nov. 23rd and took Fluconazole for UTI. She told her that she had pain when she urinated. She provided a urine sample. Weds. MD called in samed medication last Tues. Then Someone called her from office and told her to hold off on taking it until next week. Now she feels like she is itching a little on the outside. No fever. Now no pain on urination. Has the patient traveled out of the country within the last 30 days? ---No Does the patient require triage? ---Yes Related visit to physician within the last 2 weeks? ---No Does the PT have any chronic conditions? (i.e. diabetes, asthma, etc.) ---Yes List chronic conditions. ---DM Guidelines Guideline Title Affirmed Question Affirmed Notes Nurse Date/Time (Eastern Time) Vulvar Symptoms [1] Itching or dryness of genital area AND [2] nearing menopause or after menopause Martyn Ehrich, RN, [facility] 09/40/7680 8:81:10 PM Disp. Time Eilene Ghazi Time) Disposition Final User 01/15/2014 7:04:44 PM Call Completed Martyn Ehrich  RN, [facility] 31/59/4585 9:29:24 PM Call Completed Martyn Ehrich, RN, [facility] 46/28/6381 7:71:16 PM See PCP within 2 Weeks Yes Martyn Ehrich, RN, [place] PLEASE NOTE: All timestamps contained within this report are represented as [place] Standard Time. CONFIDENTIALTY NOTICE: This fax transmission is intended only for the addressee. It contains information that is legally privileged, confidential or otherwise protected from use or disclosure. If you are not the intended recipient, you are strictly prohibited from reviewing, disclosing, copying using or disseminating any of this information or taking any action in reliance on or regarding this information. If you have received this fax in error, please notify us immediately by telephone so that we can arrange for its return to [place]. Phone: [phone], Toll-Free: [phone], Fax: [phone] Page: 2 of 2 Call Id: [id number] Caller Understands: Yes Disagree/Comply: Comply Care Advice Given Per Guideline SEE PCP WITHIN 2 WEEKS: You need an evaluation for this ongoing problem within the next 2 weeks. Call your doctor during regular office hours and make an appointment. * Keep your genital area clean. Wash daily with water. * Keep your genital area dry. Wear cotton underwear or underwear with a cotton crotch. * Do not douche. * Do not use feminine hygiene products, perfumed soaps. * You become worse. After Care Instructions Given Call Event Type User Date / Time Description Comments User: Daphene Calamity, RN Date/Time Eilene Ghazi Time): 01/15/2014 7:03:58 PM Told her call back in 2 d, No whitish discharge. User: Daphene Calamity, RN Date/Time Eilene Ghazi Time): 01/15/2014 7:08:35 PM the itchiness just started today - just enough to notice

## 2014-01-16 NOTE — Telephone Encounter (Signed)
Needs office visit.

## 2014-01-16 NOTE — Patient Instructions (Signed)

## 2014-01-16 NOTE — Progress Notes (Signed)
Pre visit review using our clinic review tool, if applicable. No additional management support is needed unless otherwise documented below in the visit note. 

## 2014-01-16 NOTE — Progress Notes (Signed)
Subjective:    Patient ID: Linda Thompson, female    DOB: Feb 24, 1955, 58 y.o.   MRN: [id number]  HPI 58 year old white female, nonsmoker is in today with complaints of vaginal itching off and on 1-1/2 weeks. Wears adult diapers. Has not tried any treatment for the rash. Denies any vaginal discharge or odor that she is aware of.   Review of Systems  Constitutional: Negative.   Respiratory: Negative.   Cardiovascular: Negative.   Gastrointestinal: Negative.   Genitourinary: Negative for dysuria, frequency, hematuria, vaginal bleeding, vaginal pain and pelvic pain.       Vaginal itching  Musculoskeletal: Negative.   Neurological: Negative.   Psychiatric/Behavioral: Negative.   All other systems reviewed and are negative.  Past Medical History  Diagnosis Date  . Allergic rhinitis, cause unspecified   . Pain in joint, ankle and foot   . Backache, unspecified     chronic LBP with radiculopathy  . Seborrheic dermatitis, unspecified   . Type II or unspecified type diabetes mellitus without mention of complication, not stated as uncontrolled   . History of fracture of arm     Right  . History of fracture of foot   . Dermatomycosis, unspecified   . Esophageal reflux   . Unspecified glaucoma   . Undiagnosed cardiac murmurs   . Diaphragmatic hernia without mention of obstruction or gangrene   . Other and unspecified hyperlipidemia   . Unspecified essential hypertension   . Irritable bowel syndrome   . Benign neoplasm of cerebral meninges   . Moderate intellectual disabilities   . Nausea alone   . Osteoarthrosis, unspecified whether generalized or localized, ankle and foot   . Pruritus ani   . Unspecified tinnitus   . Nonspecific elevation of levels of transaminase or lactic acid dehydrogenase (LDH)   . Leukorrhea, not specified as infective   . Obesity   . Hyperglycemia   . DDD (degenerative disc disease), lumbar   . Yeast infection of the skin     Frequent    History    Social History  . Marital Status: Single    Spouse Name: N/A    Number of Children: 0  . Years of Education: N/A   Occupational History  . Disabled (from arm fracture)    Social History Main Topics  . Smoking status: Never Smoker   . Smokeless tobacco: Never Used  . Alcohol Use: No  . Drug Use: No  . Sexual Activity: Not Currently   Other Topics Concern  . Not on file   Social History Narrative   Lives alone-with help from neighbors      Currently-disability for arm fracture      [name] (neighbor) is POA    Past Surgical History  Procedure Laterality Date  . Total abdominal hysterectomy    . Cataract extraction    . Elbow surgery      right elbow  . Lipoma excision Left [id number]    posterior left axilla    Family History  Problem Relation Age of Onset  . Diabetes Mother   . Diabetes Maternal Aunt   . Heart disease Maternal Aunt      (had pacemaker)  . Diabetes Maternal Grandmother   . Stroke Mother   . Stroke Father   . Stroke Maternal Grandmother     Allergies  Allergen Reactions  . Ace Inhibitors Cough  . Amoxicillin-Pot Clavulanate Diarrhea  . Augmentin [Amoxicillin-Pot Clavulanate] Other (See Comments)    diarrhea  .  Azithromycin Diarrhea  . Ceftin [Cefuroxime Axetil]     diarrhea  . Cefuroxime Diarrhea and Other (See Comments)    Generalized weakness  . Cyclobenzaprine Hcl Diarrhea  . Flexeril [Cyclobenzaprine] Diarrhea  . Flonase [Fluticasone Propionate] Other (See Comments)    Nose bleed  . Ibuprofen Other (See Comments)    REACTION: vomiting    Current Outpatient Prescriptions on File Prior to Visit  Medication Sig Dispense Refill  . aspirin EC 81 MG tablet Take 81 mg by mouth daily.    Marland Kitchen desonide (DESOWEN) 0.05 % ointment Apply 1 application topically 2 (two) times daily.    Marland Kitchen diltiazem (CARDIZEM CD) 240 MG 24 hr capsule TAKE 1 CAPSULE (240 MG TOTAL) BY MOUTH DAILY. 90 capsule 3  . fluconazole (DIFLUCAN) 150 MG tablet Take 1  tablet (150 mg total) by mouth once. 1 tablet 1  . hydrochlorothiazide (MICROZIDE) 12.5 MG capsule TAKE 1 CAPSULE (12.5 MG TOTAL) BY MOUTH DAILY. 90 capsule 0  . ipratropium (ATROVENT) 0.06 % nasal spray Place 2 sprays into both nostrils 4 (four) times daily. 15 mL 5  . ketoconazole (NIZORAL) 2 % cream APPLY TOPICALLY DAILY. AS NEEDED TO AFFECTED AREA 60 g 0  . ketoconazole (NIZORAL) 2 % shampoo APPLY TO AFFECTED AREA TWICE A WEEK 120 mL 2  . latanoprost (XALATAN) 0.005 % ophthalmic solution Place 1 drop into both eyes at bedtime.     . metFORMIN (GLUCOPHAGE) 500 MG tablet TAKE 1 TABLET BY MOUTH TWICE A DAY WITH A MEAL 180 tablet 0  . nystatin (MYCOSTATIN/NYSTOP) 100000 UNIT/GM POWD Apply to affected areas with yeast three times daily as needed 2 Bottle 3  . omeprazole (PRILOSEC) 20 MG capsule TAKE 1 CAPSULE (20 MG TOTAL) BY MOUTH 2 (TWO) TIMES DAILY. 180 capsule 3  . potassium chloride SA (K-DUR,KLOR-CON) 20 MEQ tablet Take 1 tablet (20 mEq total) by mouth daily. 90 tablet 3  . pravastatin (PRAVACHOL) 40 MG tablet TAKE 1 TABLET (40 MG TOTAL) BY MOUTH EVERY EVENING. 90 tablet 2  . Propylene Glycol (SYSTANE BALANCE OP) Apply 2 drops to eye 2 (two) times daily as needed (dry eyes).     . sodium chloride (OCEAN) 0.65 % nasal spray Place 1 spray into the nose as needed for congestion.    . traMADol (ULTRAM) 50 MG tablet Take 50 mg by mouth every 6 (six) hours as needed.    . [DISCONTINUED] Calcium Carbonate-Vitamin D (CALCIUM-VITAMIN D) 500-200 MG-UNIT per tablet Take 1 tablet by mouth 2 (two) times daily with a meal.      . [DISCONTINUED] diltiazem (TIAZAC) 240 MG 24 hr capsule Take 1 capsule (240 mg total) by mouth daily. 90 capsule 0  . [DISCONTINUED] fexofenadine (ALLEGRA) 180 MG tablet Take 1 tablet (180 mg total) by mouth daily. 15 tablet 0   No current facility-administered medications on file prior to visit.    BP 120/80 mmHg  Pulse 80  Temp(Src) 98.4 F (36.9 C) (Oral)  Wt 202 lb  (91.627 kg)chart    Objective:   Physical Exam  Constitutional: She is oriented to person, place, and time. She appears well-developed and well-nourished.  Neck: Normal range of motion. Neck supple.  Cardiovascular: Normal rate, regular rhythm and normal heart sounds.   Pulmonary/Chest: Effort normal and breath sounds normal.  Abdominal: Soft. Bowel sounds are normal.  Genitourinary:  Unable to get a good internal inspection due to patient discomfort. However, external labia majora swelling and mildly red. Appears consistent with vaginitis candida  Musculoskeletal: Normal range of motion.  Neurological: She is alert and oriented to person, place, and time.  Skin: Skin is warm and dry.  Psychiatric: She has a normal mood and affect.          Assessment & Plan:  Makynli was seen today for vaginitis.  Diagnoses and associated orders for this visit:  Vaginitis - PAP [[place]]  Special screening for malignant neoplasms, vagina - PAP []  Other Orders - terconazole (TERAZOL 3) 0.8 % vaginal cream; Place 1 applicator vaginally at bedtime.    Terazol 3 one applicator into the vagina nightly 3 nights. Pap smear sent notify patient pending results. And discuss further treatment plan if necessary.

## 2014-01-17 LAB — CERVICOVAGINAL ANCILLARY ONLY
WET PREP (BD AFFIRM): NEGATIVE
Wet Prep (BD Affirm): NEGATIVE
Wet Prep (BD Affirm): NEGATIVE

## 2014-01-17 LAB — CYTOLOGY - PAP

## 2014-01-19 ENCOUNTER — Telehealth: Payer: Self-pay | Admitting: Family

## 2014-01-19 NOTE — Telephone Encounter (Signed)
Pt had a vaginitis. pt advised to eat yogurt. Pt would like to know how long she should continue to eat yogurt. Pt would like a cb. Ok to leave message. Lars Mage, can you call pt today if possible? Thanks!

## 2014-01-19 NOTE — Telephone Encounter (Signed)
Called and spoke with pt and pt states she finished the tube up yesterday.Pt denies any new symptoms of vaginitis. [place] for pt to continue yogurt.  Pt is aware and verbalized understanding.

## 2014-01-22 ENCOUNTER — Other Ambulatory Visit: Payer: Self-pay | Admitting: Family

## 2014-01-31 ENCOUNTER — Ambulatory Visit (INDEPENDENT_AMBULATORY_CARE_PROVIDER_SITE_OTHER): Payer: Medicare Other | Admitting: Family Medicine

## 2014-01-31 ENCOUNTER — Encounter: Payer: Self-pay | Admitting: Family Medicine

## 2014-01-31 VITALS — BP 112/80 | HR 102 | Temp 97.3°F | Wt 202.0 lb

## 2014-01-31 DIAGNOSIS — L290 Pruritus ani: Secondary | ICD-10-CM

## 2014-01-31 MED ORDER — TRIAMCINOLONE ACETONIDE 0.1 % EX CREA: 1.0000 "application " | TOPICAL_CREAM | Freq: Two times a day (BID) | CUTANEOUS | Status: DC | PRN

## 2014-01-31 NOTE — Progress Notes (Signed)
Pre visit review using our clinic review tool, if applicable. No additional management support is needed unless otherwise documented below in the visit note. 

## 2014-01-31 NOTE — Progress Notes (Signed)
Subjective:    Patient ID: Linda Thompson, female    DOB: 02-Nov-1955, 59 y.o.   MRN: [id number]  HPI Patient seen with some persistent perianal itching. She was just seen couple weeks ago and treated for presumed yeast vaginitis with Terazol cream. She states her itching currently is perianal and not vagina region . She was also seen here back in December by myself for similar problem. She was using hydrocortisone cream and encouraged to continue on this. No recent diarrhea. She uses unscented toilet paper. Denies any urine or stool incontinence. No dietary changes. No known food allergies.  Past Medical History  Diagnosis Date  . Allergic rhinitis, cause unspecified   . Pain in joint, ankle and foot   . Backache, unspecified     chronic LBP with radiculopathy  . Seborrheic dermatitis, unspecified   . Type II or unspecified type diabetes mellitus without mention of complication, not stated as uncontrolled   . History of fracture of arm     Right  . History of fracture of foot   . Dermatomycosis, unspecified   . Esophageal reflux   . Unspecified glaucoma   . Undiagnosed cardiac murmurs   . Diaphragmatic hernia without mention of obstruction or gangrene   . Other and unspecified hyperlipidemia   . Unspecified essential hypertension   . Irritable bowel syndrome   . Benign neoplasm of cerebral meninges   . Moderate intellectual disabilities   . Nausea alone   . Osteoarthrosis, unspecified whether generalized or localized, ankle and foot   . Pruritus ani   . Unspecified tinnitus   . Nonspecific elevation of levels of transaminase or lactic acid dehydrogenase (LDH)   . Leukorrhea, not specified as infective   . Obesity   . Hyperglycemia   . DDD (degenerative disc disease), lumbar   . Yeast infection of the skin     Frequent   Past Surgical History  Procedure Laterality Date  . Total abdominal hysterectomy    . Cataract extraction    . Elbow surgery      right elbow  .  Lipoma excision Left [id number]    posterior left axilla    reports that she has never smoked. She has never used smokeless tobacco. She reports that she does not drink alcohol or use illicit drugs. family history includes Diabetes in her maternal aunt, maternal grandmother, and mother; Heart disease in her maternal aunt; Stroke in her father, maternal grandmother, and mother. Allergies  Allergen Reactions  . Ace Inhibitors Cough  . Amoxicillin-Pot Clavulanate Diarrhea  . Augmentin [Amoxicillin-Pot Clavulanate] Other (See Comments)    diarrhea  . Azithromycin Diarrhea  . Ceftin [Cefuroxime Axetil]     diarrhea  . Cefuroxime Diarrhea and Other (See Comments)    Generalized weakness  . Cyclobenzaprine Hcl Diarrhea  . Flexeril [Cyclobenzaprine] Diarrhea  . Flonase [Fluticasone Propionate] Other (See Comments)    Nose bleed  . Ibuprofen Other (See Comments)    REACTION: vomiting      Review of Systems  Constitutional: Negative for fever and chills.  Gastrointestinal: Negative for nausea, vomiting, diarrhea and rectal pain.  Skin: Negative for rash.       Objective:   Physical Exam  Constitutional: She appears well-developed and well-nourished.  Cardiovascular: Normal rate and regular rhythm.   Pulmonary/Chest: Effort normal and breath sounds normal. No respiratory distress. She has no wheezes. She has no rales.  Skin:  Perianal exam reveals no visible fissures. No external hemorrhoids. No skin  lesions. She has a few excoriations around the 3:00 position but no signs of secondary infection.          Assessment & Plan:  Perianal pruritus. Keep area clean and dry. Triamcinolone 0.1% cream twice a day as needed. Avoid scratching. Continue unscented wipes and toilet paper

## 2014-01-31 NOTE — Patient Instructions (Signed)
Continue with unscented toilet paper. Keep bottom area clean and dry Use topical steroid prescribed but no longer than 2 weeks of continuous use Avoid scratching bottom area if possible.

## 2014-02-07 ENCOUNTER — Other Ambulatory Visit: Payer: Medicare Other

## 2014-02-14 ENCOUNTER — Ambulatory Visit (INDEPENDENT_AMBULATORY_CARE_PROVIDER_SITE_OTHER): Payer: Medicare Other

## 2014-02-14 DIAGNOSIS — B351 Tinea unguium: Secondary | ICD-10-CM

## 2014-02-14 DIAGNOSIS — L603 Nail dystrophy: Secondary | ICD-10-CM

## 2014-02-14 DIAGNOSIS — M79673 Pain in unspecified foot: Secondary | ICD-10-CM

## 2014-02-14 DIAGNOSIS — E1140 Type 2 diabetes mellitus with diabetic neuropathy, unspecified: Secondary | ICD-10-CM | POA: Diagnosis not present

## 2014-02-14 NOTE — Progress Notes (Signed)
   Subjective:    Patient ID: Linda Thompson, female    DOB: 1955/10/17, 59 y.o.   MRN: [id number]  HPI  Pt presents for nail debridement  Review of Systems no new findings or systemic changes noted     Objective:   Physical Exam Neurovascular status is intact pedal pulses are palpable epicritic sensation diminished on Semmes Weinstein to the forefoot digits and arch. There was some confusion about diabetic shoes have to reorder shoes on her behalf at this time. Should note there is thick brittle crumbly dystrophic friable criptotic nails 1 through 5 bilateral. No open wounds no ulcers no secondary infections.       Assessment & Plan:  Assessment this time diabetes history peripheral neuropathy dystrophic friable criptotic nails 1 through 5 bilateral debrided at this time and the presence of diabetes and complications. Patient does have history of neuropathy decreased sensation we'll rearrange for diabetic shoes at this time as well. Follow-up in 3 months for continued palliative care in the future as needed contact patient when diabetic shoes ready for fitting and dispensing  Harriet Masson DPM

## 2014-02-17 ENCOUNTER — Other Ambulatory Visit: Payer: Self-pay | Admitting: Family

## 2014-03-13 ENCOUNTER — Encounter: Payer: Self-pay | Admitting: Family Medicine

## 2014-03-13 ENCOUNTER — Ambulatory Visit (INDEPENDENT_AMBULATORY_CARE_PROVIDER_SITE_OTHER): Payer: Medicare Other | Admitting: Family Medicine

## 2014-03-13 VITALS — BP 124/80 | HR 102 | Temp 97.6°F | Ht 62.0 in | Wt 204.7 lb

## 2014-03-13 DIAGNOSIS — L821 Other seborrheic keratosis: Secondary | ICD-10-CM

## 2014-03-13 NOTE — Progress Notes (Signed)
Pre visit review using our clinic review tool, if applicable. No additional management support is needed unless otherwise documented below in the visit note. 

## 2014-03-13 NOTE — Progress Notes (Signed)
HPI:  Skin lesion: - few spots on several locations on arms - not itchy or painful - denies changes, spreading, pain, pruritis  ROS: See pertinent positives and negatives per HPI.  Past Medical History  Diagnosis Date  . Allergic rhinitis, cause unspecified   . Pain in joint, ankle and foot   . Backache, unspecified     chronic LBP with radiculopathy  . Seborrheic dermatitis, unspecified   . Type II or unspecified type diabetes mellitus without mention of complication, not stated as uncontrolled   . History of fracture of arm     Right  . History of fracture of foot   . Dermatomycosis, unspecified   . Esophageal reflux   . Unspecified glaucoma   . Undiagnosed cardiac murmurs   . Diaphragmatic hernia without mention of obstruction or gangrene   . Other and unspecified hyperlipidemia   . Unspecified essential hypertension   . Irritable bowel syndrome   . Benign neoplasm of cerebral meninges   . Moderate intellectual disabilities   . Nausea alone   . Osteoarthrosis, unspecified whether generalized or localized, ankle and foot   . Pruritus ani   . Unspecified tinnitus   . Nonspecific elevation of levels of transaminase or lactic acid dehydrogenase (LDH)   . Leukorrhea, not specified as infective   . Obesity   . Hyperglycemia   . DDD (degenerative disc disease), lumbar   . Yeast infection of the skin     Frequent    Past Surgical History  Procedure Laterality Date  . Total abdominal hysterectomy    . Cataract extraction    . Elbow surgery      right elbow  . Lipoma excision Left [id number]    posterior left axilla    Family History  Problem Relation Age of Onset  . Diabetes Mother   . Diabetes Maternal Aunt   . Heart disease Maternal Aunt      (had pacemaker)  . Diabetes Maternal Grandmother   . Stroke Mother   . Stroke Father   . Stroke Maternal Grandmother     History   Social History  . Marital Status: Single    Spouse Name: N/A  . Number of  Children: 0  . Years of Education: N/A   Occupational History  . Disabled (from arm fracture)    Social History Main Topics  . Smoking status: Never Smoker   . Smokeless tobacco: Never Used  . Alcohol Use: No  . Drug Use: No  . Sexual Activity: Not Currently   Other Topics Concern  . None   Social History Narrative   Lives alone-with help from neighbors      Currently-disability for arm fracture      [name] (neighbor) is POA     Current outpatient prescriptions:  .  aspirin EC 81 MG tablet, Take 81 mg by mouth daily., Disp: , Rfl:  .  desonide (DESOWEN) 0.05 % ointment, Apply 1 application topically 2 (two) times daily., Disp: , Rfl:  .  diltiazem (CARDIZEM CD) 240 MG 24 hr capsule, TAKE 1 CAPSULE (240 MG TOTAL) BY MOUTH DAILY., Disp: 90 capsule, Rfl: 3 .  hydrochlorothiazide (MICROZIDE) 12.5 MG capsule, TAKE 1 CAPSULE (12.5 MG TOTAL) BY MOUTH DAILY., Disp: 90 capsule, Rfl: 0 .  ipratropium (ATROVENT) 0.06 % nasal spray, Place 2 sprays into both nostrils 4 (four) times daily., Disp: 15 mL, Rfl: 5 .  ketoconazole (NIZORAL) 2 % cream, APPLY TOPICALLY DAILY. AS NEEDED TO AFFECTED AREA,  Disp: 60 g, Rfl: 0 .  ketoconazole (NIZORAL) 2 % shampoo, APPLY TO AFFECTED AREA TWICE A WEEK, Disp: 120 mL, Rfl: 2 .  latanoprost (XALATAN) 0.005 % ophthalmic solution, Place 1 drop into both eyes at bedtime. , Disp: , Rfl:  .  metFORMIN (GLUCOPHAGE) 500 MG tablet, TAKE 1 TABLET TWICE A DAY WITH A MEAL, Disp: 180 tablet, Rfl: 1 .  nystatin (MYCOSTATIN/NYSTOP) 100000 UNIT/GM POWD, Apply to affected areas with yeast three times daily as needed, Disp: 2 Bottle, Rfl: 3 .  omeprazole (PRILOSEC) 20 MG capsule, TAKE 1 CAPSULE (20 MG TOTAL) BY MOUTH 2 (TWO) TIMES DAILY., Disp: 180 capsule, Rfl: 3 .  potassium chloride SA (K-DUR,KLOR-CON) 20 MEQ tablet, Take 1 tablet (20 mEq total) by mouth daily., Disp: 90 tablet, Rfl: 3 .  pravastatin (PRAVACHOL) 40 MG tablet, TAKE 1 TABLET (40 MG TOTAL) BY MOUTH  EVERY EVENING., Disp: 90 tablet, Rfl: 2 .  Propylene Glycol (SYSTANE BALANCE OP), Apply 2 drops to eye 2 (two) times daily as needed (dry eyes). , Disp: , Rfl:  .  sodium chloride (OCEAN) 0.65 % nasal spray, Place 1 spray into the nose as needed for congestion., Disp: , Rfl:  .  terconazole (TERAZOL 3) 0.8 % vaginal cream, Place 1 applicator vaginally at bedtime., Disp: 20 g, Rfl: 0 .  traMADol (ULTRAM) 50 MG tablet, Take 50 mg by mouth every 6 (six) hours as needed., Disp: , Rfl:  .  triamcinolone cream (KENALOG) 0.1 %, Apply 1 application topically 2 (two) times daily as needed., Disp: 30 g, Rfl: 2 .  [DISCONTINUED] Calcium Carbonate-Vitamin D (CALCIUM-VITAMIN D) 500-200 MG-UNIT per tablet, Take 1 tablet by mouth 2 (two) times daily with a meal.  , Disp: , Rfl:  .  [DISCONTINUED] diltiazem (TIAZAC) 240 MG 24 hr capsule, Take 1 capsule (240 mg total) by mouth daily., Disp: 90 capsule, Rfl: 0 .  [DISCONTINUED] fexofenadine (ALLEGRA) 180 MG tablet, Take 1 tablet (180 mg total) by mouth daily., Disp: 15 tablet, Rfl: 0  EXAM:  Filed Vitals:   03/13/14 1357  BP: 124/80  Pulse: 102  Temp: 97.6 F (36.4 C)    Body mass index is 37.43 kg/(m^2).  GENERAL: vitals reviewed and listed above, alert, oriented, appears well hydrated and in no acute distress  HEENT: atraumatic, conjunttiva clear, no obvious abnormalities on inspection of external nose and ears  NECK: no obvious masses on inspection  SKIN: few SKs  MS: moves all extremities without noticeable abnormality  PSYCH: pleasant and cooperative, no obvious depression or anxiety  ASSESSMENT AND PLAN:  Discussed the following assessment and plan:  Seborrheic keratoses  -discussed likely benign nature of these lesions, rarely anything else, treatment and diagnostic options -she opted to observe -Patient advised to return or notify a doctor immediately if symptoms worsen or persist or new concerns arise.  There are no Patient  Instructions on file for this visit.   Linda Benton R.

## 2014-03-14 ENCOUNTER — Ambulatory Visit: Payer: Medicare Other | Admitting: Family

## 2014-03-31 ENCOUNTER — Encounter: Payer: Self-pay | Admitting: Family Medicine

## 2014-03-31 ENCOUNTER — Telehealth: Payer: Self-pay | Admitting: Family

## 2014-03-31 ENCOUNTER — Ambulatory Visit (INDEPENDENT_AMBULATORY_CARE_PROVIDER_SITE_OTHER): Payer: Medicare Other | Admitting: Family Medicine

## 2014-03-31 VITALS — BP 120/80 | HR 96 | Temp 98.5°F | Wt 203.0 lb

## 2014-03-31 DIAGNOSIS — L290 Pruritus ani: Secondary | ICD-10-CM

## 2014-03-31 MED ORDER — HYDROCORTISONE ACETATE 25 MG RE SUPP
25.0000 mg | Freq: Two times a day (BID) | RECTAL | Status: DC
Start: 2014-03-31 — End: 2014-06-09

## 2014-03-31 NOTE — Patient Instructions (Signed)
Use either unscented wipes or warm wash cloth with water to clean perianal area after every bowel movement (after wiping with toilet paper)\ May then use the triamcinolone cream for any itching.      Perianal Dermatitis Dermatitis is redness, soreness, and swelling (inflammation) of the skin. Dermatitis that occurs around the anal opening is called perianal dermatitis. Perianal dermatitis usually occurs in children. It is more common in boys than girls. This problem mainly occurs in children from 6 months to 34 years of age. CAUSES  Perianal dermatitis is caused by a type of germ (bacteria) infection. Streptococci bacteria are the most common cause of this infection. These bacteria can be found in the throat where they can cause strep throat. These bacteria can also be found on the surface of the skin. They can invade the deeper parts of the skin around the anus through small cracks on the skin, causing an infection. Family or friends with strep throat or a skin infection can spread the bacteria to others. It can also spread to the anus from a child's own strep throat or other skin infection. SYMPTOMS  The skin around the anus will be bright red in color. This redness may spread to the genitals. Other common symptoms include:  Pain when passing stools.  Blood in the stool.  Itching around the anus.  Tenderness around the anus.  Cracks in the skin around the anus.  Holding back stools to avoid pain (constipation). DIAGNOSIS  The diagnosis is made by taking a swab sample from the inflamed skin to test for bacteria. TREATMENT  Your child will be given antibiotic medicines by mouth or injection. Sometimes, antibiotics may also be directly applied to the skin (topically). If antibiotics are given by mouth, it is important to take all the medicine until it is gone. The problem often improves quickly. HOME CARE INSTRUCTIONS  This infection can come back. It can also spread to other family  members or friends. It is important to watch your child for signs that the problem is coming back. SEEK MEDICAL CARE IF:   Symptoms are not better after 2 to 3 days of treatment.  Symptoms get worse.  There are any problems from the medicines prescribed. SEEK IMMEDIATE MEDICAL CARE IF:   Your child has an oral temperature above 102 F (38.9 C), not controlled by medicine.  Your child is irritable, unusually sleepy, or has a poor appetite.  Your child has a headache.  Your child develops nausea, vomiting, or abdominal pain. Document Released: 01/26/2007 Document Revised: 03/31/2011 Document Reviewed: 05/28/2009 [facility] Patient Information 2015 [place], [place]. This information is not intended to replace advice given to you by your health care provider. Make sure you discuss any questions you have with your health care provider. hing after area has been fully cleaned.

## 2014-03-31 NOTE — Progress Notes (Signed)
Subjective:    Patient ID: Linda Thompson, female    DOB: 1955/06/16, 59 y.o.   MRN: [id number]  HPI Patient seen with persistent perianal itching. She's been here twice previously for this. She's not had any recent diarrhea. She has used triamcinolone cream with minimal relief. She had prior colonoscopy 2013. Her itching can be day or night. She's not had any history of parasitic infection such as pinworms. Previously recommendation had been to use unscented medicated wipes or washcloth after bowel movements but she has not done so.  Past Medical History  Diagnosis Date  . Allergic rhinitis, cause unspecified   . Pain in joint, ankle and foot   . Backache, unspecified     chronic LBP with radiculopathy  . Seborrheic dermatitis, unspecified   . Type II or unspecified type diabetes mellitus without mention of complication, not stated as uncontrolled   . History of fracture of arm     Right  . History of fracture of foot   . Dermatomycosis, unspecified   . Esophageal reflux   . Unspecified glaucoma   . Undiagnosed cardiac murmurs   . Diaphragmatic hernia without mention of obstruction or gangrene   . Other and unspecified hyperlipidemia   . Unspecified essential hypertension   . Irritable bowel syndrome   . Benign neoplasm of cerebral meninges   . Moderate intellectual disabilities   . Nausea alone   . Osteoarthrosis, unspecified whether generalized or localized, ankle and foot   . Pruritus ani   . Unspecified tinnitus   . Nonspecific elevation of levels of transaminase or lactic acid dehydrogenase (LDH)   . Leukorrhea, not specified as infective   . Obesity   . Hyperglycemia   . DDD (degenerative disc disease), lumbar   . Yeast infection of the skin     Frequent   Past Surgical History  Procedure Laterality Date  . Total abdominal hysterectomy    . Cataract extraction    . Elbow surgery      right elbow  . Lipoma excision Left [id number]    posterior left axilla    reports that she has never smoked. She has never used smokeless tobacco. She reports that she does not drink alcohol or use illicit drugs. family history includes Diabetes in her maternal aunt, maternal grandmother, and mother; Heart disease in her maternal aunt; Stroke in her father, maternal grandmother, and mother. Allergies  Allergen Reactions  . Ace Inhibitors Cough  . Amoxicillin-Pot Clavulanate Diarrhea  . Augmentin [Amoxicillin-Pot Clavulanate] Other (See Comments)    diarrhea  . Azithromycin Diarrhea  . Ceftin [Cefuroxime Axetil]     diarrhea  . Cefuroxime Diarrhea and Other (See Comments)    Generalized weakness  . Cyclobenzaprine Hcl Diarrhea  . Flexeril [Cyclobenzaprine] Diarrhea  . Flonase [Fluticasone Propionate] Other (See Comments)    Nose bleed  . Ibuprofen Other (See Comments)    REACTION: vomiting      Review of Systems  Constitutional: Negative for fever and chills.  Gastrointestinal: Negative for diarrhea.       Objective:   Physical Exam  Constitutional: She appears well-developed and well-nourished.  Cardiovascular: Normal rate and regular rhythm.   Pulmonary/Chest: Effort normal and breath sounds normal. No respiratory distress. She has no wheezes. She has no rales.  Skin:  Perianal area - no hemorrhoids.  Few ?excoriations.  No erythema.            Assessment & Plan:  Perianal pruritis.  History does not  suggest parasites.  We have again suggested that she use washcloth and thoroughly clean area after bowel movements.  She will try hydrocortisone 25 mg suppositories BID for the next 10 days.

## 2014-03-31 NOTE — Progress Notes (Signed)
Pre visit review using our clinic review tool, if applicable. No additional management support is needed unless otherwise documented below in the visit note. 

## 2014-03-31 NOTE — Telephone Encounter (Signed)
Patient Name: Linda Thompson DOB: Oct 23, 1955 Initial Comment Caller states she has some soreness in her rectal area; Nurse Assessment Nurse: Vallery Sa, RN, Cathy Date/Time (Eastern Time): 03/31/2014 12:48:58 PM Confirm and document reason for call. If symptomatic, describe symptoms. ---Caller states she developed soreness in the rectal area about 2 days ago that is worse today. No bleeding. No fever. No injury in the past 3 days. Has the patient traveled out of the country within the last 30 days? ---No Does the patient require triage? ---Yes Related visit to physician within the last 2 weeks? ---No Does the PT have any chronic conditions? (i.e. diabetes, asthma, etc.) ---Yes List chronic conditions. ---High Blood Pressure, Diabetes, "Fluid around her ankles", Eye problems, Hernia Guidelines Guideline Title Affirmed Question Affirmed Notes Rectal Symptoms MODERATE-SEVERE rectal pain (i.e., interferes with school, work, or sleep) Final Disposition User See Physician within [place], RN, [employer] Comments Appointment scheduled with Dr. Elease Hashimoto at 3:35pm today.

## 2014-04-07 ENCOUNTER — Telehealth: Payer: Self-pay | Admitting: Family Medicine

## 2014-04-07 NOTE — Telephone Encounter (Signed)
[facility] - Client [facility] Patient Name: Linda Thompson DOB: 09-Oct-1955 Initial Comment Caller states she stomach feels like its going around and around Nurse Assessment Nurse: Martyn Ehrich, RN, Solmon Ice Date/Time (Eastern Time): 04/07/2014 4:18:48 PM Confirm and document reason for call. If symptomatic, describe symptoms. ---Pt does not have stomach pain but she feels her stomach is going around and around. She thinks it is something she ate. She has diarrhea (1x) and nausea. No fever Has the patient traveled out of the country within the last 30 days? ---No Does the patient require triage? ---Yes Related visit to physician within the last 2 weeks? ---No Does the PT have any chronic conditions? (i.e. diabetes, asthma, etc.) ---Yes List chronic conditions. ---DM Guidelines Guideline Title Affirmed Question Affirmed NotesDiarrhea Mild diarrhea (all triage questions negative) Final Disposition User [facility] [place], RN, [place] Ice Comments told her if she has diarrhea again she should follow this advice. History of HTN, IBS Did not make appointment bc she only had one diarrheal stool - so not truly diarrhea yet and no diarrhea in the last month - told her call back if she has diarrhea again and tell the nurse she has IBS.Call Id: [id number]

## 2014-04-07 NOTE — Telephone Encounter (Signed)
FYI

## 2014-04-15 ENCOUNTER — Other Ambulatory Visit: Payer: Self-pay | Admitting: Family Medicine

## 2014-04-21 ENCOUNTER — Telehealth: Payer: Self-pay | Admitting: Family

## 2014-04-21 NOTE — Telephone Encounter (Signed)
Patient informed and states she already has Miconazole 7 at home and will use this and call back if needed.

## 2014-04-21 NOTE — Telephone Encounter (Signed)
This is likely yeast and would advise she try an OTC treatment such as monistat - follow instructions. Schedule appointment if persists.

## 2014-04-21 NOTE — Telephone Encounter (Signed)
Pt has had ongoing females issues since last November with this same problem. Pt states she has female itching sometimes when she urinates. Pt instructed to eat ypgurt and she does every day/  Pt does not have an OBGYN. pls advise.

## 2014-04-25 ENCOUNTER — Ambulatory Visit (INDEPENDENT_AMBULATORY_CARE_PROVIDER_SITE_OTHER): Payer: Medicare Other | Admitting: Family Medicine

## 2014-04-25 ENCOUNTER — Encounter: Payer: Self-pay | Admitting: Family Medicine

## 2014-04-25 VITALS — BP 124/82 | HR 98 | Temp 97.5°F | Ht 62.0 in | Wt 203.7 lb

## 2014-04-25 DIAGNOSIS — N760 Acute vaginitis: Secondary | ICD-10-CM

## 2014-04-25 NOTE — Progress Notes (Signed)
Pre visit review using our clinic review tool, if applicable. No additional management support is needed unless otherwise documented below in the visit note. 

## 2014-04-25 NOTE — Progress Notes (Signed)
HPI:  Vulvovaginal Pruritis: -for about 1 week -used OTC yeast cream and is much better, now only has occ pruritis -denies: discharge, pelvic or abd pain, vaginal bleeding, fevers, dysuria -declines STI testing - no sexual partners; no douching -s/p hysterectomy  ROS: See pertinent positives and negatives per HPI.  Past Medical History  Diagnosis Date  . Allergic rhinitis, cause unspecified   . Pain in joint, ankle and foot   . Backache, unspecified     chronic LBP with radiculopathy  . Seborrheic dermatitis, unspecified   . Type II or unspecified type diabetes mellitus without mention of complication, not stated as uncontrolled   . History of fracture of arm     Right  . History of fracture of foot   . Dermatomycosis, unspecified   . Esophageal reflux   . Unspecified glaucoma   . Undiagnosed cardiac murmurs   . Diaphragmatic hernia without mention of obstruction or gangrene   . Other and unspecified hyperlipidemia   . Unspecified essential hypertension   . Irritable bowel syndrome   . Benign neoplasm of cerebral meninges   . Moderate intellectual disabilities   . Nausea alone   . Osteoarthrosis, unspecified whether generalized or localized, ankle and foot   . Pruritus ani   . Unspecified tinnitus   . Nonspecific elevation of levels of transaminase or lactic acid dehydrogenase (LDH)   . Leukorrhea, not specified as infective   . Obesity   . Hyperglycemia   . DDD (degenerative disc disease), lumbar   . Yeast infection of the skin     Frequent    Past Surgical History  Procedure Laterality Date  . Total abdominal hysterectomy    . Cataract extraction    . Elbow surgery      right elbow  . Lipoma excision Left [id number]    posterior left axilla    Family History  Problem Relation Age of Onset  . Diabetes Mother   . Diabetes Maternal Aunt   . Heart disease Maternal Aunt      (had pacemaker)  . Diabetes Maternal Grandmother   . Stroke Mother   . Stroke  Father   . Stroke Maternal Grandmother     History   Social History  . Marital Status: Single    Spouse Name: N/A  . Number of Children: 0  . Years of Education: N/A   Occupational History  . Disabled (from arm fracture)    Social History Main Topics  . Smoking status: Never Smoker   . Smokeless tobacco: Never Used  . Alcohol Use: No  . Drug Use: No  . Sexual Activity: Not Currently   Other Topics Concern  . None   Social History Narrative   Lives alone-with help from neighbors      Currently-disability for arm fracture      [name] (neighbor) is POA     Current outpatient prescriptions:  .  aspirin EC 81 MG tablet, Take 81 mg by mouth daily., Disp: , Rfl:  .  desonide (DESOWEN) 0.05 % ointment, Apply 1 application topically 2 (two) times daily., Disp: , Rfl:  .  diltiazem (CARDIZEM CD) 240 MG 24 hr capsule, TAKE 1 CAPSULE (240 MG TOTAL) BY MOUTH DAILY., Disp: 90 capsule, Rfl: 3 .  hydrochlorothiazide (MICROZIDE) 12.5 MG capsule, TAKE 1 CAPSULE (12.5 MG TOTAL) BY MOUTH DAILY., Disp: 90 capsule, Rfl: 0 .  hydrocortisone (ANUSOL-HC) 25 MG suppository, Place 1 suppository (25 mg total) rectally 2 (two) times daily., Disp:  20 suppository, Rfl: 0 .  ipratropium (ATROVENT) 0.06 % nasal spray, Place 2 sprays into both nostrils 4 (four) times daily., Disp: 15 mL, Rfl: 5 .  ketoconazole (NIZORAL) 2 % cream, APPLY TOPICALLY DAILY. AS NEEDED TO AFFECTED AREA, Disp: 60 g, Rfl: 0 .  ketoconazole (NIZORAL) 2 % shampoo, APPLY TO AFFECTED AREA TWICE A WEEK, Disp: 120 mL, Rfl: 2 .  latanoprost (XALATAN) 0.005 % ophthalmic solution, Place 1 drop into both eyes at bedtime. , Disp: , Rfl:  .  metFORMIN (GLUCOPHAGE) 500 MG tablet, TAKE 1 TABLET TWICE A DAY WITH A MEAL, Disp: 180 tablet, Rfl: 1 .  nystatin (MYCOSTATIN/NYSTOP) 100000 UNIT/GM POWD, Apply to affected areas with yeast three times daily as needed, Disp: 2 Bottle, Rfl: 3 .  omeprazole (PRILOSEC) 20 MG capsule, TAKE 1 CAPSULE  (20 MG TOTAL) BY MOUTH 2 (TWO) TIMES DAILY., Disp: 180 capsule, Rfl: 3 .  potassium chloride SA (K-DUR,KLOR-CON) 20 MEQ tablet, Take 1 tablet (20 mEq total) by mouth daily., Disp: 90 tablet, Rfl: 3 .  pravastatin (PRAVACHOL) 40 MG tablet, TAKE 1 TABLET (40 MG TOTAL) BY MOUTH EVERY EVENING., Disp: 90 tablet, Rfl: 2 .  Propylene Glycol (SYSTANE BALANCE OP), Apply 2 drops to eye 2 (two) times daily as needed (dry eyes). , Disp: , Rfl:  .  sodium chloride (OCEAN) 0.65 % nasal spray, Place 1 spray into the nose as needed for congestion., Disp: , Rfl:  .  terconazole (TERAZOL 3) 0.8 % vaginal cream, Place 1 applicator vaginally at bedtime., Disp: 20 g, Rfl: 0 .  traMADol (ULTRAM) 50 MG tablet, Take 50 mg by mouth every 6 (six) hours as needed., Disp: , Rfl:  .  triamcinolone cream (KENALOG) 0.1 %, Apply 1 application topically 2 (two) times daily as needed., Disp: 30 g, Rfl: 2 .  [DISCONTINUED] Calcium Carbonate-Vitamin D (CALCIUM-VITAMIN D) 500-200 MG-UNIT per tablet, Take 1 tablet by mouth 2 (two) times daily with a meal.  , Disp: , Rfl:  .  [DISCONTINUED] diltiazem (TIAZAC) 240 MG 24 hr capsule, Take 1 capsule (240 mg total) by mouth daily., Disp: 90 capsule, Rfl: 0 .  [DISCONTINUED] fexofenadine (ALLEGRA) 180 MG tablet, Take 1 tablet (180 mg total) by mouth daily., Disp: 15 tablet, Rfl: 0  EXAM:  Filed Vitals:   04/25/14 0839  BP: 124/82  Pulse: 98  Temp: 97.5 F (36.4 C)    Body mass index is 37.25 kg/(m^2).  GENERAL: vitals reviewed and listed above, alert, oriented, appears well hydrated and in no acute distress  HEENT: atraumatic, conjunttiva clear, no obvious abnormalities on inspection of external nose and ears  NECK: no obvious masses on inspection  ABD: BS+, soft, NTTP  GU: mild vulvovag irritation of skin, no abnormal dicharge  MS: moves all extremities without noticeable abnormality  PSYCH: pleasant and cooperative, no obvious depression or anxiety  ASSESSMENT AND  PLAN:  Discussed the following assessment and plan:  Vaginitis and vulvovaginitis  -she reports this is resolving with OTC yeast tx -advised preventive strategies, OTC yeast tx prn, probiotic  -Patient advised to return or notify a doctor immediately if symptoms worsen or persist or new concerns arise.  Patient Instructions  Use the over the counter yeast cream from time to time for 1 week when needed  Can try Align Probiotic for 2-4 weeks     KIM, HANNAH R.

## 2014-04-25 NOTE — Patient Instructions (Signed)
Use the over the counter yeast cream from time to time for 1 week when needed  Can try Align Probiotic for 2-4 weeks

## 2014-04-26 ENCOUNTER — Telehealth: Payer: Self-pay | Admitting: Family

## 2014-04-26 NOTE — Telephone Encounter (Signed)
Patient informed and appt cancelled for tomorrow.

## 2014-04-26 NOTE — Telephone Encounter (Signed)
FYI: Patient called stating she is now having irritation around her rectum.  She is scheduled for tomorrow due to no openings today.

## 2014-04-26 NOTE — Telephone Encounter (Signed)
Advise trying the yeast cream in this area as well for one week as is likely the yeast. Appointment in 1 week if persists - can cancel appt tomorrow if she is ok with it. thanks.

## 2014-04-27 ENCOUNTER — Ambulatory Visit: Payer: Medicare Other | Admitting: Family Medicine

## 2014-05-01 ENCOUNTER — Telehealth: Payer: Self-pay | Admitting: Family

## 2014-05-01 NOTE — Telephone Encounter (Signed)
Pt would like a call about some medicine she bought over the counter .

## 2014-05-01 NOTE — Telephone Encounter (Signed)
I called the pt and she stated she took the AVS to the pharmacy to get the Align and she purchased one from [facility] and it has fiber in this and she questioned if this was fine to take.  She stated she has irritable bowel and was told once to be careful with taking fiber.  I advised the pt per Dr Maudie Mercury she is not aware of Align containing fiber but it should be OK to take this short term, which she should take for only 2-4 weeks and she agreed.

## 2014-05-04 ENCOUNTER — Telehealth: Payer: Self-pay | Admitting: Family

## 2014-05-04 DIAGNOSIS — N898 Other specified noninflammatory disorders of vagina: Secondary | ICD-10-CM

## 2014-05-04 NOTE — Telephone Encounter (Signed)
Patient informed of the message below and she is aware the referral to the gynecologist was placed and she is aware someone will call her with an appt.

## 2014-05-04 NOTE — Telephone Encounter (Signed)
Pt said she is still itching down there  and she spoke to pharmacist at [facility] and they told her she might need a strong antibiotic.   Pharmacy  [facility]

## 2014-05-04 NOTE — Telephone Encounter (Signed)
There were no signs of an infection. If persists advise she see gyn.

## 2014-05-09 ENCOUNTER — Telehealth: Payer: Self-pay | Admitting: Obstetrics and Gynecology

## 2014-05-09 DIAGNOSIS — Z961 Presence of intraocular lens: Secondary | ICD-10-CM | POA: Diagnosis not present

## 2014-05-09 DIAGNOSIS — D485 Neoplasm of uncertain behavior of skin: Secondary | ICD-10-CM | POA: Diagnosis not present

## 2014-05-09 DIAGNOSIS — H4011X1 Primary open-angle glaucoma, mild stage: Secondary | ICD-10-CM | POA: Diagnosis not present

## 2014-05-09 LAB — HM DIABETES EYE EXAM

## 2014-05-09 NOTE — Telephone Encounter (Signed)
Called and left a message for patient to call back to schedule a new patient doctor referral. °

## 2014-05-11 ENCOUNTER — Telehealth: Payer: Self-pay | Admitting: Family

## 2014-05-11 ENCOUNTER — Telehealth: Payer: Self-pay | Admitting: *Deleted

## 2014-05-11 NOTE — Telephone Encounter (Signed)
Would like a call back. Still taking about probiotic

## 2014-05-11 NOTE — Telephone Encounter (Signed)
PLEASE NOTE: All timestamps contained within this report are represented as [place] Standard Time. CONFIDENTIALTY NOTICE: This fax transmission is intended only for the addressee. It contains information that is legally privileged, confidential or otherwise protected from use or disclosure. If you are not the intended recipient, you are strictly prohibited from reviewing, disclosing, copying using or disseminating any of this information or taking any action in reliance on or regarding this information. If you have received this fax in error, please notify us immediately by telephone so that we can arrange for its return to [place]. Phone: [phone], Toll-Free: [phone], Fax: [phone] Page: 1 of 2 Call Id: [id number] [facility] - Client [name] Patient Name: Linda Thompson Gender: Female DOB: 03-04-1955 Age: 59 Y 9 M 8 D Return Phone Number: [phone] (Primary) Address: [address] City/State/Zip: [name] [address] Client [facility] - Client Client Site [facility] - Night Physician [name] Contact Type Call Call Type Triage / Clinical Relationship To Patient Self Return Phone Number [phone] (Primary) Chief Complaint Itching Initial Comment Caller states she is having vaginal itching. She would like a female nurse to call her back. Nurse Assessment Nurse: Thad Ranger RN, Denise Date/Time (Eastern Time): 05/10/2014 10:31:42 PM Confirm and document reason for call. If symptomatic, describe symptoms. ---Caller states she is having vaginal itching. States she was seen [facility] wks ago for vaginal itching and was told to use a new cream but she states the cream is too expensive. States the MD told her to use a Probiotic. States she is calling about the probiotic only. States she had one diarrhea stool this eve and wants to know if it is due to the  Probiotic. Pt begins telling me about all of her MD appts, history of her move and her other MD's inst previously. Pt rambles and talks in circles but states she is following all of her MD instructions and has an appt in May with a new doctor. States she has some vaginal cream that she keeps on hand in case she needs it for vaginal itching but has not used it. Has the patient traveled out of the country within the last 30 days? ---Not Applicable Does the patient require triage? ---No Please document clinical information provided and list any resource used. ---Based on RN clinical knowledge, advised the one diarrhea stool she had this eve may be due to something she ate since she has been on the Probiotic x 2 wks w/o SE's. Advised to drink plenty of fluids, cont the Probiotic a/o. Advised to use the vag cream she has on hand for vag itching untill she recieves further inst from her MD. Verb understanding. Guidelines Guideline Title Affirmed Question Affirmed Notes Nurse Date/Time (Eastern Time) Disp. Time Eilene Ghazi Time) Disposition Final User 05/10/2014 10:47:37 PM Clinical Call Yes Thad Ranger, RN, Langley Gauss After Care Instructions Given Call Event Type User Date / Time Description PLEASE NOTE: All timestamps contained within this report are represented as [place] Standard Time. CONFIDENTIALTY NOTICE: This fax transmission is intended only for the addressee. It contains information that is legally privileged, confidential or otherwise protected from use or disclosure. If you are not the intended recipient, you are strictly prohibited from reviewing, disclosing, copying using or disseminating any of this information or taking any action in reliance on or regarding this information. If you have received this fax in error, please notify us immediately by telephone so  that we can arrange for its return to [place]. Phone: [phone], Toll-Free: [phone], Fax: [phone] Page: 2 of 2 Call Id:  [id number]  ------------------------------------------------------------------------------------------ Spoke with PCP Megan Salon and advised she was okay with patient waiting to be seen on 5/11 with GYNO.

## 2014-05-11 NOTE — Telephone Encounter (Signed)
I called the pt and she stated she was concerned about whether the probiotic made her have a bowel movement.  She denies any diarrhea or upset stomach and I advised her not to worry since she did not have these symptoms and to continue the probiotic as Dr Maudie Mercury advised for 4 weeks.  She asked about the appt with the OB/GYN and stated this may cause a problem with her missing her eye doctor appt and I advised she call their office if needed to reschedule and/or see if they have any cancellations so she could be seen sooner and she agreed.

## 2014-05-20 ENCOUNTER — Telehealth: Payer: Self-pay

## 2014-05-20 NOTE — Telephone Encounter (Signed)
PLEASE NOTE: All timestamps contained within this report are represented as [place] Standard Time. CONFIDENTIALTY NOTICE: This fax transmission is intended only for the addressee. It contains information that is legally privileged, confidential or otherwise protected from use or disclosure. If you are not the intended recipient, you are strictly prohibited from reviewing, disclosing, copying using or disseminating any of this information or taking any action in reliance on or regarding this information. If you have received this fax in error, please notify us immediately by telephone so that we can arrange for its return to [place]. Phone: [phone], Toll-Free: [phone], Fax: [phone] Page: 1 of 2 Call Id: [id number] [facility] - Client [place] Patient Name: Linda Thompson Gender: Female DOB: [birth year] Age: 59 Y 9 M 17 D Return Phone Number: [phone] (Primary) Address: [address] City/State/Zip: [name] [address] Client [facility] - Client Client Site [facility] - Night Physician [name] Contact Type Call Call Type Triage / Clinical Relationship To Patient Self Return Phone Number [phone] (Primary) Chief Complaint Cough Initial Comment Caller states she has been coughing and clearing her throat with sore throat. The caller states she is coughing so much she is nauseated. PreDisposition Did not know what to do Nurse Assessment Nurse: Dimas Chyle, RN, Dellis Filbert Date/Time Eilene Ghazi Time): 05/19/2014 11:35:58 PM Confirm and document reason for call. If symptomatic, describe symptoms. ---Caller states she has been coughing and clearing her throat with sore throat. The caller states she is coughing so much she vomited. No fever. Started about an hour ago. Has the patient traveled out of the country within the last 30 days? ---No Does the patient  require triage? ---Yes Related visit to physician within the last 2 weeks? ---N/A Does the PT have any chronic conditions? (i.e. diabetes, asthma, etc.) ---Yes List chronic conditions. ---HTN, Diabetes Type 2, hyperlipidemia, hernia Guidelines Guideline Title Affirmed Question Affirmed Notes Nurse Date/Time Eilene Ghazi Time) Choking - Inhaled Foreign Body Recovered from choking (all triage questions negative) Dimas Chyle, RN, Dellis Filbert 05/19/2014 11:38:51 PM Disp. Time Eilene Ghazi Time) Disposition Final User 05/19/2014 11:46:32 PM Home Care Yes Dimas Chyle, RN, Guerry Minors Understands: Yes Disagree/Comply: Comply PLEASE NOTE: All timestamps contained within this report are represented as [place] Standard Time. CONFIDENTIALTY NOTICE: This fax transmission is intended only for the addressee. It contains information that is legally privileged, confidential or otherwise protected from use or disclosure. If you are not the intended recipient, you are strictly prohibited from reviewing, disclosing, copying using or disseminating any of this information or taking any action in reliance on or regarding this information. If you have received this fax in error, please notify us immediately by telephone so that we can arrange for its return to [place]. Phone: [phone], Toll-Free: [phone], Fax: [phone] Page: 2 of 2 Call Id: [id number] Care Advice Given Per Guideline HOME CARE: You should be able to treat this at home. REASSURANCE: If the patient has no coughing or other respiratory symptoms, he must have coughed up the object on his own, or swallowed it. He should do fine. CALL BACK IF: * Coughing returns * Shortness of breath develops * You become worse CARE ADVICE given per Choking - Inhaled FB (Adult) guideline After Care Instructions Given Call Event Type User Date / Time Description  Noted.

## 2014-05-23 ENCOUNTER — Ambulatory Visit (INDEPENDENT_AMBULATORY_CARE_PROVIDER_SITE_OTHER): Payer: Medicare Other

## 2014-05-23 DIAGNOSIS — M2042 Other hammer toe(s) (acquired), left foot: Secondary | ICD-10-CM | POA: Diagnosis not present

## 2014-05-23 DIAGNOSIS — Q828 Other specified congenital malformations of skin: Secondary | ICD-10-CM

## 2014-05-23 DIAGNOSIS — B351 Tinea unguium: Secondary | ICD-10-CM

## 2014-05-23 DIAGNOSIS — M79673 Pain in unspecified foot: Secondary | ICD-10-CM | POA: Diagnosis not present

## 2014-05-23 DIAGNOSIS — E1140 Type 2 diabetes mellitus with diabetic neuropathy, unspecified: Secondary | ICD-10-CM | POA: Diagnosis not present

## 2014-05-23 NOTE — Patient Instructions (Signed)

## 2014-05-23 NOTE — Progress Notes (Signed)
HPI Presents today chief complaint of painful elongated toenails.  Objective: Pulses are palpable bilateral nails are thick, yellow dystrophic onychomycosis and painful palpation.   Assessment: Onychomycosis with pain in limb.  Plan: Treatment of nails in thickness and length as covered service secondary to pain.  

## 2014-05-30 ENCOUNTER — Telehealth: Payer: Self-pay | Admitting: Family Medicine

## 2014-05-30 NOTE — Telephone Encounter (Signed)
Can stop if felling better. Can use as needed.

## 2014-05-30 NOTE — Telephone Encounter (Signed)
Pt came in the office and is asking if she still need to use the digest probiotic. Would like call back

## 2014-05-31 ENCOUNTER — Ambulatory Visit: Payer: Medicare Other | Admitting: Obstetrics and Gynecology

## 2014-05-31 NOTE — Telephone Encounter (Signed)
Patient informed. 

## 2014-06-09 ENCOUNTER — Encounter: Payer: Self-pay | Admitting: Obstetrics and Gynecology

## 2014-06-09 ENCOUNTER — Ambulatory Visit (INDEPENDENT_AMBULATORY_CARE_PROVIDER_SITE_OTHER): Payer: Medicare Other | Admitting: Obstetrics and Gynecology

## 2014-06-09 VITALS — BP 130/84 | HR 88 | Resp 18 | Ht 62.0 in | Wt 203.8 lb

## 2014-06-09 DIAGNOSIS — N907 Vulvar cyst: Secondary | ICD-10-CM

## 2014-06-09 DIAGNOSIS — N761 Subacute and chronic vaginitis: Secondary | ICD-10-CM

## 2014-06-09 DIAGNOSIS — N760 Acute vaginitis: Secondary | ICD-10-CM | POA: Diagnosis not present

## 2014-06-09 MED ORDER — FLUCONAZOLE 150 MG PO TABS: 150.0000 mg | ORAL_TABLET | Freq: Once | ORAL | Status: DC

## 2014-06-09 NOTE — Patient Instructions (Signed)
Use your Kenalog cream 0.1% to your vulva twice a day for the next week.  If you do not have this at home, contact your pharmacy for a refill.

## 2014-06-09 NOTE — Progress Notes (Signed)
Patient ID: Linda Thompson, female   DOB: 1955-04-26, 59 y.o.   MRN: [id number] GYNECOLOGY  VISIT   HPI: 59 y.o.   Single  Caucasian  female   [name] with Patient's last menstrual period was 01/21/1992 (approximate).   here for vaginal itching/irritation for 4-6 weeks.   No discharge.  No vaginal odor.  Has had treatment of Nystatin powder, Terazol, and triamcinolone.  Uses the powder if has irritation of skin under breasts in groin area.   Not sexually active ever.  Not taking HRT.   Has known AODM.  December 12, 2013 - HgbA1C 7.1.  No pain or discomfort with urination.   Has IBS also.  Uses Lomotil. Wears Depends. Had appointment with Lennie Odor PA-C of Dr. Ledell Peoples office.   GYNECOLOGIC HISTORY: Patient's last menstrual period was 01/21/1992 (approximate). Contraception:  Hysterectomy.  Hysterectomy for fibroids?  Uncertain if has ovaries.  Menopausal hormone therapy: none Last mammogram: 07-13-13 mostly fatty/nl:[facility] Last pap smear: 01-16-14 normal.  No hx of abnormal paps        OB History    Gravida Para Term Preterm AB TAB SAB Ectopic Multiple Living   0 0 0 0 0 0 0 0 0 0          Patient Active Problem List   Diagnosis Date Noted  . Sinus headache 12/15/2012  . Stress reaction 12/07/2012  . Low back pain 11/22/2012  . Labial swelling 11/03/2012  . Vaginal irritation 10/14/2012  . Diarrhea 06/23/2012  . Seborrheic keratosis 04/23/2012  . Chronic dermatitis of hands 09/12/2011  . Obesity 05/05/2011  . Candidal intertrigo 11/15/2010  . Irritable bowel syndrome 03/05/2010  . HEART MURMUR 10/12/2009  . TRANSAMINASES, SERUM, ELEVATED 11/22/2007  . DIABETES MELLITUS, TYPE II 08/13/2007  . ALLERGIC  RHINITIS 09/23/2006  . FUNGAL DERMATITIS 08/26/2006  . DERMATITIS, SEBORRHEIC NOS 08/26/2006  . HYPERLIPIDEMIA 03/11/2006  . MENTAL RETARDATION, MODERATE 03/11/2006  . GLAUCOMA NOS 03/11/2006  . HYPERTENSION 03/11/2006  . GERD 03/11/2006  . HIATAL HERNIA  03/11/2006  . PRURITUS ANI 03/11/2006  . OSTEOARTHRITIS, FOOT 03/11/2006  . Personal history of other musculoskeletal disorders(V13.59) 03/11/2006  . MENINGIOMA 12/20/2001    Past Medical History  Diagnosis Date  . Allergic rhinitis, cause unspecified   . Pain in joint, ankle and foot   . Backache, unspecified     chronic LBP with radiculopathy  . Seborrheic dermatitis, unspecified   . Type II or unspecified type diabetes mellitus without mention of complication, not stated as uncontrolled   . History of fracture of arm     Right  . History of fracture of foot   . Dermatomycosis, unspecified   . Esophageal reflux   . Unspecified glaucoma   . Undiagnosed cardiac murmurs   . Diaphragmatic hernia without mention of obstruction or gangrene   . Other and unspecified hyperlipidemia   . Unspecified essential hypertension   . Irritable bowel syndrome   . Benign neoplasm of cerebral meninges   . Moderate intellectual disabilities   . Nausea alone   . Osteoarthrosis, unspecified whether generalized or localized, ankle and foot   . Pruritus ani   . Unspecified tinnitus   . Nonspecific elevation of levels of transaminase or lactic acid dehydrogenase (LDH)   . Leukorrhea, not specified as infective   . Obesity   . Hyperglycemia   . DDD (degenerative disc disease), lumbar   . Yeast infection of the skin     Frequent    Past Surgical History  Procedure Laterality Date  . Total abdominal hysterectomy    . Cataract extraction    . Elbow surgery      right elbow  . Lipoma excision Left [id number]    posterior left axilla    Current Outpatient Prescriptions  Medication Sig Dispense Refill  . aspirin EC 81 MG tablet Take 81 mg by mouth daily.    Marland Kitchen desonide (DESOWEN) 0.05 % ointment Apply 1 application topically 2 (two) times daily.    Marland Kitchen diltiazem (CARDIZEM CD) 240 MG 24 hr capsule TAKE 1 CAPSULE (240 MG TOTAL) BY MOUTH DAILY. 90 capsule 3  . hydrochlorothiazide (MICROZIDE) 12.5 MG  capsule TAKE 1 CAPSULE (12.5 MG TOTAL) BY MOUTH DAILY. 90 capsule 0  . ipratropium (ATROVENT) 0.06 % nasal spray Place 2 sprays into both nostrils 4 (four) times daily. 15 mL 5  . ketoconazole (NIZORAL) 2 % cream APPLY TOPICALLY DAILY. AS NEEDED TO AFFECTED AREA 60 g 0  . ketoconazole (NIZORAL) 2 % shampoo APPLY TO AFFECTED AREA TWICE A WEEK 120 mL 2  . latanoprost (XALATAN) 0.005 % ophthalmic solution Place 1 drop into both eyes at bedtime.     Marland Kitchen loratadine (CLARITIN) 10 MG tablet Take 10 mg by mouth daily.    . metFORMIN (GLUCOPHAGE) 500 MG tablet TAKE 1 TABLET TWICE A DAY WITH A MEAL 180 tablet 1  . nystatin (MYCOSTATIN/NYSTOP) 100000 UNIT/GM POWD Apply to affected areas with yeast three times daily as needed 2 Bottle 3  . omeprazole (PRILOSEC) 20 MG capsule TAKE 1 CAPSULE (20 MG TOTAL) BY MOUTH 2 (TWO) TIMES DAILY. 180 capsule 3  . potassium chloride (KLOR-CON) 20 MEQ packet Take 20 mEq by mouth daily.    . potassium chloride SA (K-DUR,KLOR-CON) 20 MEQ tablet Take 1 tablet (20 mEq total) by mouth daily. 90 tablet 3  . pravastatin (PRAVACHOL) 40 MG tablet TAKE 1 TABLET (40 MG TOTAL) BY MOUTH EVERY EVENING. 90 tablet 2  . Propylene Glycol (SYSTANE BALANCE OP) Apply 2 drops to eye 2 (two) times daily as needed (dry eyes).     . sodium chloride (OCEAN) 0.65 % nasal spray Place 1 spray into the nose as needed for congestion.    Marland Kitchen terconazole (TERAZOL 3) 0.8 % vaginal cream Place 1 applicator vaginally at bedtime. 20 g 0  . traMADol (ULTRAM) 50 MG tablet Take 50 mg by mouth every 6 (six) hours as needed.    . triamcinolone cream (KENALOG) 0.1 % Apply 1 application topically 2 (two) times daily as needed. 30 g 2  . Wound Dressings (ATRAPRO HYDROGEL EX) Apply 1 application topically as needed.    . [DISCONTINUED] Calcium Carbonate-Vitamin D (CALCIUM-VITAMIN D) 500-200 MG-UNIT per tablet Take 1 tablet by mouth 2 (two) times daily with a meal.      . [DISCONTINUED] diltiazem (TIAZAC) 240 MG 24 hr  capsule Take 1 capsule (240 mg total) by mouth daily. 90 capsule 0  . [DISCONTINUED] fexofenadine (ALLEGRA) 180 MG tablet Take 1 tablet (180 mg total) by mouth daily. 15 tablet 0   No current facility-administered medications for this visit.     ALLERGIES: Ace inhibitors; Amoxicillin-pot clavulanate; Augmentin; Azithromycin; Ceftin; Cefuroxime; Cyclobenzaprine hcl; Flexeril; Flonase; and Ibuprofen  Family History  Problem Relation Age of Onset  . Diabetes Mother   . Stroke Mother   . Hypertension Mother   . Diabetes Maternal Aunt   . Heart disease Maternal Aunt      (had pacemaker)  . Diabetes Maternal Grandmother   .  Stroke Maternal Grandmother   . Hypertension Maternal Grandmother   . Stroke Father     History   Social History  . Marital Status: Single    Spouse Name: N/A  . Number of Children: 0  . Years of Education: N/A   Occupational History  . Disabled (from arm fracture)    Social History Main Topics  . Smoking status: Never Smoker   . Smokeless tobacco: Never Used  . Alcohol Use: No  . Drug Use: No  . Sexual Activity: Not Currently    Birth Control/ Protection: Surgical     Comment: Hyst--TAH--unsure if has ovaries   Other Topics Concern  . Not on file   Social History Narrative   Lives alone-with help from neighbors      Currently-disability for arm fracture      [name] (neighbor) is POA    ROS:  Pertinent items are noted in HPI.  PHYSICAL EXAMINATION:    BP 130/84 mmHg  Pulse 88  Resp 18  Ht 5\' 2"  (1.575 m)  Wt 203 lb 12.8 oz (92.443 kg)  BMI 37.27 kg/m2  LMP 01/21/1992 (Approximate)    General appearance: alert, cooperative and appears stated age Head: Normocephalic, without obvious abnormality, atraumatic Lungs: clear to auscultation bilaterally Heart: regular rate and rhythm Abdomen: obese with pannus, soft, non-tender; bowel sounds normal; no masses.  Exam limited by body habitus.  Extremities: extremities normal, atraumatic, no  cyanosis or edema Skin:  Erythematous raised plaques under breasts, under pannus and intertriginous areas.  Skin cracking under pannus.  Lymph nodes: Cervical, supraclavicular, and axillary nodes normal. No abnormal inguinal nodes palpated Neurologic: Grossly normal  Pelvic: External genitalia:  Labia minora hypopigmented with greyish-white coloration.   Right labia majora with enlargement and cystic area almost 2 cm, nontender.  No erythema of skin.  No drainage noted.               Urethra:  normal appearing urethra with no masses, tenderness or lesions              Bartholins and Skenes: normal                 Vagina: normal appearing vagina with normal color and discharge, no lesions              Cervix: absent            Bimanual Exam:  Uterus:  uterus absent              Adnexa: no mass, fullness, tenderness and Exam limited by body habitus.              Rectovaginal: Yes.  .  Confirms.              Anus:  normal sphincter tone, no lesions  Chaperone was present for exam.  ASSESSMENT  Chronic vulvovaginitis. Probable chronic yeast. Diabetes mellitus.  Possible atrophy symptoms versus lichen sclerosus.  Labial cyst. No need for intervention or removal. Status post TAH.  PLAN  Counseled regarding probable yeast infection.  Will do Affirm testing.  Will do daily treatment with Diflucan for one week.  Use Triamcinolone cream to vulva twice daily for one week.  Return in 10 days to reassess.  If symptoms do not improve in vulvar area, will consider biopsy to rule out lichen sclerosus. Get OP report and pathology for hysterectomy from [facility]. An After Visit Summary was printed and given to the patient.  _30_____  minutes face to face time of which over 50% was spent in counseling.

## 2014-06-10 LAB — WET PREP BY MOLECULAR PROBE
Candida species: NEGATIVE
Gardnerella vaginalis: NEGATIVE
Trichomonas vaginosis: NEGATIVE

## 2014-06-12 ENCOUNTER — Telehealth: Payer: Self-pay

## 2014-06-12 NOTE — Telephone Encounter (Signed)
-----   Message from Nunzio Cobbs, MD sent at 06/10/2014 10:29 AM EDT ----- Please report to patient she has no vaginal infection at this time.  I am treating her for external yeast infection and will be seeing her back for a recheck in about 10 days.

## 2014-06-12 NOTE — Telephone Encounter (Signed)
Called patient at [phone], LMOVM to call me back.

## 2014-06-12 NOTE — Telephone Encounter (Signed)
Patient notified of test results 

## 2014-06-21 ENCOUNTER — Other Ambulatory Visit: Payer: Self-pay | Admitting: Family

## 2014-06-23 ENCOUNTER — Ambulatory Visit (INDEPENDENT_AMBULATORY_CARE_PROVIDER_SITE_OTHER): Payer: Medicare Other | Admitting: Obstetrics and Gynecology

## 2014-06-23 ENCOUNTER — Encounter: Payer: Self-pay | Admitting: Obstetrics and Gynecology

## 2014-06-23 VITALS — BP 130/88 | HR 90 | Ht 62.0 in | Wt 201.4 lb

## 2014-06-23 DIAGNOSIS — R21 Rash and other nonspecific skin eruption: Secondary | ICD-10-CM

## 2014-06-23 DIAGNOSIS — L290 Pruritus ani: Secondary | ICD-10-CM | POA: Diagnosis not present

## 2014-06-23 DIAGNOSIS — N760 Acute vaginitis: Secondary | ICD-10-CM | POA: Diagnosis not present

## 2014-06-23 MED ORDER — TRIAMCINOLONE ACETONIDE 0.1 % EX CREA: 1.0000 "application " | TOPICAL_CREAM | Freq: Two times a day (BID) | CUTANEOUS | Status: DC | PRN

## 2014-06-23 MED ORDER — NYSTATIN 100000 UNIT/GM EX POWD: CUTANEOUS | Status: DC

## 2014-06-23 NOTE — Patient Instructions (Addendum)
Please use the Nystatin powder under your breasts and in the folds near the thigh three times a day as needed for rash and irritation.   Use the Triamcinolone cream twice a day on your arms, leg and perianal region twice a day as needed for rash. See you medical doctor if this rash does not resolve.   You have refills sent to your pharmacy by me for both prescriptions.

## 2014-06-23 NOTE — Progress Notes (Signed)
GYNECOLOGY  VISIT   HPI: 59 y.o.   Single  Caucasian  female   [name] with Patient's last menstrual period was 01/21/1992 (approximate).   here for vulvovaginitis recheck.  Patient also asking about treatment of rash on her ams and under the breasts. Affirm testing - negative. Did Diflucan for one week.  Use Triamcinolone cream to vulva twice daily for one week.  Symptoms are better.   Return in 10 days to reassess.  If symptoms do not improve in vulvar area, will consider biopsy to rule out lichen sclerosus.  Asking if her surgery report received yet from [facility]. Presumed hysterectomy procedure.   GYNECOLOGIC HISTORY: Patient's last menstrual period was 01/21/1992 (approximate). Contraception: TAH/Hysterectomy  Menopausal hormone therapy: n/a Last mammogram: 07/13/13 bi-rads 1; negative Last pap smear: 01/16/14 wnl        OB History    Gravida Para Term Preterm AB TAB SAB Ectopic Multiple Living   0 0 0 0 0 0 0 0 0 0          Patient Active Problem List   Diagnosis Date Noted  . Sinus headache 12/15/2012  . Stress reaction 12/07/2012  . Low back pain 11/22/2012  . Labial swelling 11/03/2012  . Vaginal irritation 10/14/2012  . Diarrhea 06/23/2012  . Seborrheic keratosis 04/23/2012  . Chronic dermatitis of hands 09/12/2011  . Obesity 05/05/2011  . Candidal intertrigo 11/15/2010  . Irritable bowel syndrome 03/05/2010  . HEART MURMUR 10/12/2009  . TRANSAMINASES, SERUM, ELEVATED 11/22/2007  . DIABETES MELLITUS, TYPE II 08/13/2007  . ALLERGIC  RHINITIS 09/23/2006  . FUNGAL DERMATITIS 08/26/2006  . DERMATITIS, SEBORRHEIC NOS 08/26/2006  . HYPERLIPIDEMIA 03/11/2006  . MENTAL RETARDATION, MODERATE 03/11/2006  . GLAUCOMA NOS 03/11/2006  . HYPERTENSION 03/11/2006  . GERD 03/11/2006  . HIATAL HERNIA 03/11/2006  . PRURITUS ANI 03/11/2006  . OSTEOARTHRITIS, FOOT 03/11/2006  . Personal history of other musculoskeletal disorders(V13.59) 03/11/2006  .  MENINGIOMA 12/20/2001    Past Medical History  Diagnosis Date  . Allergic rhinitis, cause unspecified   . Pain in joint, ankle and foot   . Backache, unspecified     chronic LBP with radiculopathy  . Seborrheic dermatitis, unspecified   . Type II or unspecified type diabetes mellitus without mention of complication, not stated as uncontrolled   . History of fracture of arm     Right  . History of fracture of foot   . Dermatomycosis, unspecified   . Esophageal reflux   . Unspecified glaucoma   . Undiagnosed cardiac murmurs   . Diaphragmatic hernia without mention of obstruction or gangrene   . Other and unspecified hyperlipidemia   . Unspecified essential hypertension   . Irritable bowel syndrome   . Benign neoplasm of cerebral meninges   . Moderate intellectual disabilities   . Nausea alone   . Osteoarthrosis, unspecified whether generalized or localized, ankle and foot   . Pruritus ani   . Unspecified tinnitus   . Nonspecific elevation of levels of transaminase or lactic acid dehydrogenase (LDH)   . Leukorrhea, not specified as infective   . Obesity   . Hyperglycemia   . DDD (degenerative disc disease), lumbar   . Yeast infection of the skin     Frequent    Past Surgical History  Procedure Laterality Date  . Total abdominal hysterectomy    . Cataract extraction    . Elbow surgery      right elbow  . Lipoma excision Left [id number]  posterior left axilla    Current Outpatient Prescriptions  Medication Sig Dispense Refill  . aspirin EC 81 MG tablet Take 81 mg by mouth daily.    Marland Kitchen desonide (DESOWEN) 0.05 % ointment Apply 1 application topically 2 (two) times daily.    Marland Kitchen diltiazem (CARDIZEM CD) 240 MG 24 hr capsule TAKE 1 CAPSULE (240 MG TOTAL) BY MOUTH DAILY. 90 capsule 3  . fluconazole (DIFLUCAN) 150 MG tablet Take 1 tablet (150 mg total) by mouth once. Take daily for one week. 7 tablet 0  . hydrochlorothiazide (MICROZIDE) 12.5 MG capsule TAKE 1 CAPSULE (12.5 MG  TOTAL) BY MOUTH DAILY. 90 capsule 0  . ipratropium (ATROVENT) 0.06 % nasal spray PLACE 2 SPRAYS INTO BOTH NOSTRILS 4 (FOUR) TIMES DAILY. 15 mL 5  . ketoconazole (NIZORAL) 2 % cream APPLY TOPICALLY DAILY. AS NEEDED TO AFFECTED AREA 60 g 0  . ketoconazole (NIZORAL) 2 % shampoo APPLY TO AFFECTED AREA TWICE A WEEK 120 mL 2  . latanoprost (XALATAN) 0.005 % ophthalmic solution Place 1 drop into both eyes at bedtime.     Marland Kitchen loratadine (CLARITIN) 10 MG tablet Take 10 mg by mouth daily.    . metFORMIN (GLUCOPHAGE) 500 MG tablet TAKE 1 TABLET TWICE A DAY WITH A MEAL 180 tablet 1  . nystatin (MYCOSTATIN/NYSTOP) 100000 UNIT/GM POWD Apply to affected areas with yeast three times daily as needed 2 Bottle 3  . omeprazole (PRILOSEC) 20 MG capsule TAKE 1 CAPSULE (20 MG TOTAL) BY MOUTH 2 (TWO) TIMES DAILY. 180 capsule 3  . potassium chloride (KLOR-CON) 20 MEQ packet Take 20 mEq by mouth daily.    . potassium chloride SA (K-DUR,KLOR-CON) 20 MEQ tablet Take 1 tablet (20 mEq total) by mouth daily. 90 tablet 3  . pravastatin (PRAVACHOL) 40 MG tablet TAKE 1 TABLET (40 MG TOTAL) BY MOUTH EVERY EVENING. 90 tablet 2  . Propylene Glycol (SYSTANE BALANCE OP) Apply 2 drops to eye 2 (two) times daily as needed (dry eyes).     . sodium chloride (OCEAN) 0.65 % nasal spray Place 1 spray into the nose as needed for congestion.    Marland Kitchen terconazole (TERAZOL 3) 0.8 % vaginal cream Place 1 applicator vaginally at bedtime. 20 g 0  . traMADol (ULTRAM) 50 MG tablet Take 50 mg by mouth every 6 (six) hours as needed.    . triamcinolone cream (KENALOG) 0.1 % Apply 1 application topically 2 (two) times daily as needed. 30 g 2  . Wound Dressings (ATRAPRO HYDROGEL EX) Apply 1 application topically as needed.    . [DISCONTINUED] Calcium Carbonate-Vitamin D (CALCIUM-VITAMIN D) 500-200 MG-UNIT per tablet Take 1 tablet by mouth 2 (two) times daily with a meal.      . [DISCONTINUED] diltiazem (TIAZAC) 240 MG 24 hr capsule Take 1 capsule (240 mg total)  by mouth daily. 90 capsule 0  . [DISCONTINUED] fexofenadine (ALLEGRA) 180 MG tablet Take 1 tablet (180 mg total) by mouth daily. 15 tablet 0   No current facility-administered medications for this visit.     ALLERGIES: Ace inhibitors; Amoxicillin-pot clavulanate; Augmentin; Azithromycin; Ceftin; Cefuroxime; Cyclobenzaprine hcl; Flexeril; Flonase; and Ibuprofen  Family History  Problem Relation Age of Onset  . Diabetes Mother   . Stroke Mother   . Hypertension Mother   . Diabetes Maternal Aunt   . Heart disease Maternal Aunt      (had pacemaker)  . Diabetes Maternal Grandmother   . Stroke Maternal Grandmother   . Hypertension Maternal Grandmother   .  Stroke Father     History   Social History  . Marital Status: Single    Spouse Name: N/A  . Number of Children: 0  . Years of Education: N/A   Occupational History  . Disabled (from arm fracture)    Social History Main Topics  . Smoking status: Never Smoker   . Smokeless tobacco: Never Used  . Alcohol Use: No  . Drug Use: No  . Sexual Activity: Not Currently    Birth Control/ Protection: Surgical     Comment: Hyst--TAH--unsure if has ovaries   Other Topics Concern  . Not on file   Social History Narrative   Lives alone-with help from neighbors      Currently-disability for arm fracture      [name] (neighbor) is POA    ROS:  Pertinent items are noted in HPI.  PHYSICAL EXAMINATION:    Ht 5\' 2"  (1.575 m)  LMP 01/21/1992 (Approximate)    General appearance: alert, cooperative and appears stated age     Skin: Skin color, texture, turgor normal.  Excoriated patches on arms and left anterior tibial surface. Erythematous plaques under bilateral breasts. Intertriginous areas with mild erythema and cream noted.    Pelvic: External genitalia:  no lesions.  Pale labia minora bilaterally with multiple veins noted.  This area nontender and no specific lesions.              Urethra:  normal appearing urethra with  no masses, tenderness or lesions              Bartholins and Skenes: normal                 Vagina: normal appearing vagina with normal color and discharge, no lesions              Cervix:  Absent.                                      Anus:   no lesions.  No hemorrhoids.  Mild erythema.  Chaperone was present for exam.  ASSESSMENT  Vulvovaginitis improved with antifungal treatment. Candidal infection under breasts and intertriginous thigh region. Perianal itching.  No sign of hemorrhoids. Skin eruptions on extremities.  DM.  PLAN  Counseled regarding infections of candida related to DM.  Nystatin powder.   See orders. Triamcinolone cream to extremities and perianal region prn.  See orders. If skin eruptions persist, to PCP. Return prn.   An After Visit Summary was printed and given to the patient.  __15____ minutes face to face time of which over 50% was spent in counseling.

## 2014-06-24 ENCOUNTER — Other Ambulatory Visit: Payer: Self-pay | Admitting: Family Medicine

## 2014-07-05 ENCOUNTER — Encounter: Payer: Self-pay | Admitting: Certified Nurse Midwife

## 2014-07-05 ENCOUNTER — Ambulatory Visit (INDEPENDENT_AMBULATORY_CARE_PROVIDER_SITE_OTHER): Payer: Medicare Other | Admitting: Certified Nurse Midwife

## 2014-07-05 VITALS — BP 118/80 | HR 72 | Resp 20 | Ht 62.0 in | Wt 202.0 lb

## 2014-07-05 DIAGNOSIS — N898 Other specified noninflammatory disorders of vagina: Secondary | ICD-10-CM

## 2014-07-05 DIAGNOSIS — N760 Acute vaginitis: Secondary | ICD-10-CM | POA: Diagnosis not present

## 2014-07-05 DIAGNOSIS — L298 Other pruritus: Secondary | ICD-10-CM

## 2014-07-05 NOTE — Progress Notes (Signed)
59 y.o.Single white, female g0p0 here with complaint of vaginal symptoms of itching, burning, only on external area of vagina ? Inside. Describes discharge as none. Patient could not clarify what medication creams she was using except cream on her arm and powder in groin area. She thought it was the Nizoral. Onset of symptoms 3 days ago. Denies new personal products or vaginal dryness. Urinary symptoms none .  Menopausal.   O:Healthy female WDWN Affect: normal, orientation x 3 with difficulty hearing due to hearing loss and understanding,   Exam: Abdomen: non tender Lymph node: no enlargement or tenderness Pelvic exam: External genital: normal female with slight increase pink in vulva area and slight edema on right vulva from what appears to be scratching. No open sores or lichen appearance at this point.  Affirm taken from this area and vagina BUS: negative Vagina: scant discharge noted.  Affirm taken Cervix absent Uterus: absent Adnexa:, no masses or fullness noted, difficult to feel with patient scarring on abdomen   A:Normal limited pelvic exam S/p TAH Vulvitis ? Due to polymedication use Comprehension due hearing limited   P:Discussed findings of vulvitis and etiology. Discussed Aveeno or baking soda wash with mixing in large cup and pouring over area to soothe itching. Patient understood and will try. Avoid moist clothes or pads for extended period of time. Wears depends at times, but limit. Instructed to stop all medication in vaginal area. No creams. Purchase Aveeno Eczema cream and apply thinly to vulva only once daily for 5 days, this was written down for patient and voiced understanding. Needs follow up visit in 2 weeks. Encouraged to not scratch area.  Rv prn and 2 weeks

## 2014-07-06 ENCOUNTER — Telehealth: Payer: Self-pay | Admitting: Certified Nurse Midwife

## 2014-07-06 ENCOUNTER — Other Ambulatory Visit: Payer: Self-pay | Admitting: Certified Nurse Midwife

## 2014-07-06 DIAGNOSIS — B9689 Other specified bacterial agents as the cause of diseases classified elsewhere: Secondary | ICD-10-CM

## 2014-07-06 DIAGNOSIS — N760 Acute vaginitis: Principal | ICD-10-CM

## 2014-07-06 LAB — WET PREP BY MOLECULAR PROBE
CANDIDA SPECIES: NEGATIVE
GARDNERELLA VAGINALIS: POSITIVE — AB
TRICHOMONAS VAG: NEGATIVE

## 2014-07-06 MED ORDER — HYLAFEM VA SUPP: 1.0000 | Freq: Every day | VAGINAL | Status: DC

## 2014-07-06 NOTE — Progress Notes (Signed)
Reviewed personally.  M. Suzanne Normagene Harvie, MD.  

## 2014-07-06 NOTE — Telephone Encounter (Signed)
Spoke with patient. Patient states she had to order Hylafem from the pharmacy because it was not in stock. Should be in tomorrow for her to start. Advised that is okay and can start as soon as she picks up rx. Patient is agreeable.  Routing to provider for final review. Patient agreeable to disposition. Will close encounter.

## 2014-07-06 NOTE — Telephone Encounter (Signed)
Patient has a question about her medication

## 2014-07-06 NOTE — Telephone Encounter (Signed)
Left message to call Kaitlyn at [phone]. 

## 2014-07-13 ENCOUNTER — Telehealth: Payer: Self-pay | Admitting: Certified Nurse Midwife

## 2014-07-13 NOTE — Telephone Encounter (Signed)
Patient calling to check on the status of the call.

## 2014-07-13 NOTE — Telephone Encounter (Signed)
Patient calling with questions about the Rx she is taking for vaginal itching.

## 2014-07-13 NOTE — Telephone Encounter (Signed)
Spoke with patient. Patient states that she used Hylafem as prescribed by Regina Eck CNM on 07/05/2014 for three days starting on 6/17-6/19. Has also been using Aveeno Ezcema cream externally as needed for itching. Patient states she is still experiencing vaginal itching internally and externally. Asking if she should use refill of Hylafem at this time. Per Regina Eck CNM note from 07/05/2014 patient to follow up next week. Advised will speak with covering provider regarding symptoms and further recommendations. Patient is agreeable and verbalizes understanding.

## 2014-07-13 NOTE — Telephone Encounter (Signed)
Patient is asking if she needs a refill on cream Evalee Mutton gave her. Patient called earlier today. I told patient the nurse is waiting to hear back from [address] [address].

## 2014-07-13 NOTE — Telephone Encounter (Signed)
She may take another refill on Hylafem and then follow up in 7-10 with Ms. Debbie.

## 2014-07-13 NOTE — Telephone Encounter (Signed)
Spoke with patient. Advised of message as seen below from [place]ford Cage, [place]. Patient is agreeable. Follow up appointment scheduled for 07/27/2014 at 10am with Regina Eck CNM. Agreeable to date and time. Will obtain refill on Hylafem and start x3 nights.  Routing to provider for final review. Patient agreeable to disposition. Will close encounter.

## 2014-07-13 NOTE — Telephone Encounter (Signed)
Routing to Patricia Rolen-Grubb, FNP for review and advise. 

## 2014-07-18 ENCOUNTER — Encounter: Payer: Self-pay | Admitting: Obstetrics and Gynecology

## 2014-07-19 ENCOUNTER — Telehealth: Payer: Self-pay | Admitting: Certified Nurse Midwife

## 2014-07-19 NOTE — Telephone Encounter (Signed)
Patient calling with questions for the nurse about her medication "for a yeast infection."

## 2014-07-19 NOTE — Telephone Encounter (Signed)
Spoke with patient. Patient states that she has completed second dose of Hylafem. Calling to see if she may use Aveeno cream externally as she needs it for itching. Denies any current symptoms. Advised if external itching returns may use the cream as needed. Patient is scheduled for follow up appointment on 07/27/2014 at 10am with Regina Eck CNM.   Routing to provider for final review. Patient agreeable to disposition. Will close encounter.   Patient aware provider will review message and nurse will return call if any additional advice or change of disposition.

## 2014-07-27 ENCOUNTER — Ambulatory Visit (INDEPENDENT_AMBULATORY_CARE_PROVIDER_SITE_OTHER): Payer: Medicare Other

## 2014-07-27 ENCOUNTER — Ambulatory Visit (INDEPENDENT_AMBULATORY_CARE_PROVIDER_SITE_OTHER): Payer: Medicare Other | Admitting: Obstetrics and Gynecology

## 2014-07-27 ENCOUNTER — Encounter: Payer: Self-pay | Admitting: Certified Nurse Midwife

## 2014-07-27 ENCOUNTER — Other Ambulatory Visit: Payer: Self-pay | Admitting: Obstetrics and Gynecology

## 2014-07-27 ENCOUNTER — Ambulatory Visit (INDEPENDENT_AMBULATORY_CARE_PROVIDER_SITE_OTHER): Payer: Medicare Other | Admitting: Certified Nurse Midwife

## 2014-07-27 VITALS — BP 126/80 | HR 72 | Resp 20 | Ht 62.0 in | Wt 203.0 lb

## 2014-07-27 DIAGNOSIS — A499 Bacterial infection, unspecified: Secondary | ICD-10-CM | POA: Diagnosis not present

## 2014-07-27 DIAGNOSIS — N9089 Other specified noninflammatory disorders of vulva and perineum: Secondary | ICD-10-CM

## 2014-07-27 DIAGNOSIS — N760 Acute vaginitis: Secondary | ICD-10-CM

## 2014-07-27 DIAGNOSIS — N949 Unspecified condition associated with female genital organs and menstrual cycle: Secondary | ICD-10-CM

## 2014-07-27 DIAGNOSIS — B9689 Other specified bacterial agents as the cause of diseases classified elsewhere: Secondary | ICD-10-CM

## 2014-07-27 NOTE — Progress Notes (Signed)
59 y.o. Single Caucasian female [name] here for follow up of BV treated with Hylafem and Aveeno Eczema cream initiated on 07/05/14.Completed all medication as directed.  Denies any symptoms of vaginal itching or burning or increase vaginal discharge. She used 6 of the suppositories with good result. She has not used anything else in vaginal area and "feels so much better". Has noted the swelling of the vulva has not changed. No other health issues today. Busy with appointments, friend brought her today.    O: Healthy WD,WN female Affect: normal for her, orientation x3 Skin: warm and dry Abdomen:non tender Inguinal lymph nodes not swollen or tender Pelvic exam:EXTERNAL GENITALIA: normal appearing vulva with no masses, tenderness or lesions, positive for right vulva mass, non tender, no pointing,no redness,confirmed by Dr. Talbert Nan VAGINA: no abnormal discharge or lesions, Wet Prep/KOH no pathogens and pH 4.0 CERVIX: no lesions or cervical motion tenderness and absent UTERUS: absent ADNEXA: declined by patient  Rectal area;; no redness or scaling  A:BV Resolved  Right Vulva mass    P: Discussed findings of BV resolved and may continue with Aveeno Eczema only if having itching. If discharge returns needs OV. Limit depends use and change if needed.  Discussed [place] fo vulva mass, patient agreeable. Will try to do today while patient as here due transportation and assistance patient needs. Patient agreeable.  RV prn regarding vaginal itching.

## 2014-07-27 NOTE — Progress Notes (Signed)
Reviewed personally.  M. Suzanne Kallista Pae, MD.  

## 2014-07-27 NOTE — Progress Notes (Addendum)
          Asked to see the patient by D. Hollice Espy, CNM for a mass in her right vuvla. The patient has a non-tender approximately 2 cm lump in her right vulva, under her skin, suspicious for a cyst in the canal of [place] vs a Lipoma. U/S recommended. The patient has trouble with transportation and the scan was done in the office. Solid lump with out vascularity.   Will confer with radiology and make a plan for f/u. The patient is aware we will be in touch with her.    Addedum: Spoke with Dr Linus Galas in Radiology. He feels it is likely a complex cyst, can't r/o lipoma, recommends further evaluation. Will refer to GYN oncology for evaluation.

## 2014-07-31 ENCOUNTER — Other Ambulatory Visit: Payer: Self-pay | Admitting: Family Medicine

## 2014-07-31 ENCOUNTER — Encounter: Payer: Self-pay | Admitting: Obstetrics and Gynecology

## 2014-08-01 ENCOUNTER — Telehealth: Payer: Self-pay | Admitting: Nurse Practitioner

## 2014-08-01 ENCOUNTER — Telehealth: Payer: Self-pay | Admitting: Obstetrics and Gynecology

## 2014-08-01 ENCOUNTER — Telehealth: Payer: Self-pay | Admitting: *Deleted

## 2014-08-01 NOTE — Telephone Encounter (Signed)
Call received from [place] at [facility]. Appointment rescheduled to 08-17-14 at 1215. Arrive 1145 to register.

## 2014-08-01 NOTE — Telephone Encounter (Signed)
Return call to [employer]. Notified of appointment date and time. Instructions given for parking and registration. Phone number to Dr Serita Grit office given. [name] requests I call patient and notify her of appointment and that  [name] is aware.  Call to patient. Advised of appointment and that Ms Flippin aware.  Routing to provider for final review. Will close encounter.

## 2014-08-01 NOTE — Telephone Encounter (Signed)
Incoming referral from Dr. Talbert Nan for vulvar mass on ultrasound. MD recommending biopsy. Reviewed with Joylene John, NP. Scheduled on 08/07/14 at 12:15. Informed representative at Dr. Gentry Fitz office to have patient arrive 30 minutes early.  Referring office to call patient.

## 2014-08-01 NOTE — Telephone Encounter (Signed)
Call to [facility], [place]. Spoke to [place] and appointment scheduled with Dr Denman George for 08-07-14 at 1215, arrive at 1145 to register.

## 2014-08-01 NOTE — Telephone Encounter (Signed)
-----   Message from Salvadore Dom, MD sent at 07/31/2014  3:55 PM EDT ----- Please set patient up for an appointment with gyn oncology.  Thank you!!!! Sharee Pimple

## 2014-08-01 NOTE — Telephone Encounter (Signed)
Call to [name], patients health care POA and notified of appointment. [name] is unavailable next week and requests we reschedule appointment so that she can be with Linda Thompson for appointment. Call back to [place] office. Left message requesting to reschedule appointment to the following week.

## 2014-08-01 NOTE — Telephone Encounter (Signed)
-----   Message from Salvadore Dom, MD sent at 07/29/2014  5:50 PM EDT ----- Call Radiology

## 2014-08-07 ENCOUNTER — Ambulatory Visit: Payer: Medicare Other | Admitting: Family Medicine

## 2014-08-07 ENCOUNTER — Ambulatory Visit: Payer: Medicare Other | Admitting: Gynecologic Oncology

## 2014-08-08 ENCOUNTER — Encounter: Payer: Self-pay | Admitting: Family Medicine

## 2014-08-08 ENCOUNTER — Ambulatory Visit (INDEPENDENT_AMBULATORY_CARE_PROVIDER_SITE_OTHER): Payer: Medicare Other | Admitting: Family Medicine

## 2014-08-08 VITALS — BP 122/80 | HR 112 | Temp 97.5°F | Ht 62.0 in | Wt 199.9 lb

## 2014-08-08 DIAGNOSIS — E119 Type 2 diabetes mellitus without complications: Secondary | ICD-10-CM | POA: Diagnosis not present

## 2014-08-08 DIAGNOSIS — Z23 Encounter for immunization: Secondary | ICD-10-CM | POA: Diagnosis not present

## 2014-08-08 DIAGNOSIS — Z1239 Encounter for other screening for malignant neoplasm of breast: Secondary | ICD-10-CM

## 2014-08-08 DIAGNOSIS — Z114 Encounter for screening for human immunodeficiency virus [HIV]: Secondary | ICD-10-CM

## 2014-08-08 DIAGNOSIS — L309 Dermatitis, unspecified: Secondary | ICD-10-CM | POA: Diagnosis not present

## 2014-08-08 DIAGNOSIS — I10 Essential (primary) hypertension: Secondary | ICD-10-CM

## 2014-08-08 DIAGNOSIS — F71 Moderate intellectual disabilities: Secondary | ICD-10-CM

## 2014-08-08 DIAGNOSIS — E785 Hyperlipidemia, unspecified: Secondary | ICD-10-CM | POA: Diagnosis not present

## 2014-08-08 DIAGNOSIS — Z1159 Encounter for screening for other viral diseases: Secondary | ICD-10-CM | POA: Diagnosis not present

## 2014-08-08 DIAGNOSIS — Z113 Encounter for screening for infections with a predominantly sexual mode of transmission: Secondary | ICD-10-CM

## 2014-08-08 LAB — BASIC METABOLIC PANEL
BUN: 13 mg/dL (ref 6–23)
CHLORIDE: 105 meq/L (ref 96–112)
CO2: 28 mEq/L (ref 19–32)
Calcium: 9.1 mg/dL (ref 8.4–10.5)
Creatinine, Ser: 0.62 mg/dL (ref 0.40–1.20)
GFR: 104.35 mL/min (ref >60.00)
GLUCOSE: 98 mg/dL (ref 70–99)
POTASSIUM: 3.8 meq/L (ref 3.5–5.1)
Sodium: 142 mEq/L (ref 135–145)

## 2014-08-08 LAB — MICROALBUMIN / CREATININE URINE RATIO
Creatinine,U: 139.5 mg/dL
Microalb Creat Ratio: 2 mg/g (ref 0.0–30.0)
Microalb, Ur: 2.8 mg/dL — ABNORMAL HIGH (ref 0.0–1.9)

## 2014-08-08 LAB — HEMOGLOBIN A1C: HEMOGLOBIN A1C: 6.6 % — AB (ref 4.6–6.5)

## 2014-08-08 NOTE — Addendum Note (Signed)
Addended by: Agnes Lawrence on: 08/08/2014 10:01 AM   Modules accepted: Orders

## 2014-08-08 NOTE — Addendum Note (Signed)
Addended by: Agnes Lawrence on: 08/08/2014 10:16 AM   Modules accepted: Orders

## 2014-08-08 NOTE — Patient Instructions (Signed)
BEFORE YOU LEAVE: -labs -please help her set up mammogram -please come fasting to your appointment in October  Please schedule your mammogram  DOVE unscented sensitive soap  CERAVE cream in a tub at least once daily  Use the triamcinilone cream twice daily for 2 weeks on the itchy patches

## 2014-08-08 NOTE — Progress Notes (Signed)
Pre visit review using our clinic review tool, if applicable. No additional management support is needed unless otherwise documented below in the visit note. 

## 2014-08-08 NOTE — Progress Notes (Signed)
HPI:  Acute visit for:  Skin Rash: -on and off chonically -itchy rash -triamcinilone cream helps  -patches of itchy dry skin on arms and legs -patch on L arm and on L leg currently -denies: pain, fevers, malaise  DM: -asa, metformin 500mg  bid opthalmology exam - done per her report -denies hypoglycemia, vision changes, wounds on feet  HTN: -well controlled -denies CP, sob, doe   Advised mammogram and she reports she was contacted about this to set this up    ROS: See pertinent positives and negatives per HPI.  Past Medical History  Diagnosis Date  . Allergic rhinitis, cause unspecified   . Pain in joint, ankle and foot   . Backache, unspecified     chronic LBP with radiculopathy  . Seborrheic dermatitis, unspecified   . Type II or unspecified type diabetes mellitus without mention of complication, not stated as uncontrolled   . History of fracture of arm     Right  . History of fracture of foot   . Dermatomycosis, unspecified   . Esophageal reflux   . Unspecified glaucoma   . Undiagnosed cardiac murmurs   . Diaphragmatic hernia without mention of obstruction or gangrene   . Other and unspecified hyperlipidemia   . Unspecified essential hypertension   . Irritable bowel syndrome   . Benign neoplasm of cerebral meninges   . Moderate intellectual disabilities   . Nausea alone   . Osteoarthrosis, unspecified whether generalized or localized, ankle and foot   . Pruritus ani   . Unspecified tinnitus   . Nonspecific elevation of levels of transaminase or lactic acid dehydrogenase (LDH)   . Leukorrhea, not specified as infective   . Obesity   . Hyperglycemia   . DDD (degenerative disc disease), lumbar   . Yeast infection of the skin     Frequent    Past Surgical History  Procedure Laterality Date  . Total abdominal hysterectomy  03/05/1992    leiomyoma and cellular leiomyoma  . Cataract extraction    . Elbow surgery      right elbow  . Lipoma excision Left  [id number]    posterior left axilla    Family History  Problem Relation Age of Onset  . Diabetes Mother   . Stroke Mother   . Hypertension Mother   . Diabetes Maternal Aunt   . Heart disease Maternal Aunt      (had pacemaker)  . Diabetes Maternal Grandmother   . Stroke Maternal Grandmother   . Hypertension Maternal Grandmother   . Stroke Father     History   Social History  . Marital Status: Single    Spouse Name: N/A  . Number of Children: 0  . Years of Education: N/A   Occupational History  . Disabled (from arm fracture)    Social History Main Topics  . Smoking status: Never Smoker   . Smokeless tobacco: Never Used  . Alcohol Use: No  . Drug Use: No  . Sexual Activity: Not Currently    Birth Control/ Protection: Surgical     Comment: Hyst--TAH--unsure if has ovaries   Other Topics Concern  . None   Social History Narrative   Lives alone-with help from neighbors      Currently-disability for arm fracture      [name] (neighbor) is POA     Current outpatient prescriptions:  .  aspirin EC 81 MG tablet, Take 81 mg by mouth daily., Disp: , Rfl:  .  desonide (DESOWEN) 0.05 %  ointment, Apply 1 application topically 2 (two) times daily., Disp: , Rfl:  .  diltiazem (CARDIZEM CD) 240 MG 24 hr capsule, TAKE ONE CAPSULE BY MOUTH EVERY DAY, Disp: 90 capsule, Rfl: 3 .  hydrochlorothiazide (MICROZIDE) 12.5 MG capsule, TAKE 1 CAPSULE EVERY DAY, Disp: 90 capsule, Rfl: 0 .  ipratropium (ATROVENT) 0.06 % nasal spray, PLACE 2 SPRAYS INTO BOTH NOSTRILS 4 (FOUR) TIMES DAILY., Disp: 15 mL, Rfl: 5 .  ketoconazole (NIZORAL) 2 % cream, APPLY TOPICALLY DAILY. AS NEEDED TO AFFECTED AREA, Disp: 60 g, Rfl: 0 .  ketoconazole (NIZORAL) 2 % shampoo, APPLY TO AFFECTED AREA TWICE A WEEK, Disp: 120 mL, Rfl: 2 .  latanoprost (XALATAN) 0.005 % ophthalmic solution, Place 1 drop into both eyes at bedtime. , Disp: , Rfl:  .  loratadine (CLARITIN) 10 MG tablet, Take 10 mg by mouth daily., Disp:  , Rfl:  .  metFORMIN (GLUCOPHAGE) 500 MG tablet, TAKE 1 TABLET BY MOUTH TWICE A DAY WITH A MEAL, Disp: 180 tablet, Rfl: 0 .  omeprazole (PRILOSEC) 20 MG capsule, TAKE 1 CAPSULE (20 MG TOTAL) BY MOUTH 2 (TWO) TIMES DAILY., Disp: 180 capsule, Rfl: 3 .  potassium chloride (KLOR-CON) 20 MEQ packet, Take 20 mEq by mouth daily., Disp: , Rfl:  .  potassium chloride SA (K-DUR,KLOR-CON) 20 MEQ tablet, Take 1 tablet (20 mEq total) by mouth daily., Disp: 90 tablet, Rfl: 3 .  pravastatin (PRAVACHOL) 40 MG tablet, TAKE 1 TABLET BY MOUTH EVERY EVENING, Disp: 90 tablet, Rfl: 2 .  Propylene Glycol (SYSTANE BALANCE OP), Apply 2 drops to eye 2 (two) times daily as needed (dry eyes). , Disp: , Rfl:  .  sodium chloride (OCEAN) 0.65 % nasal spray, Place 1 spray into the nose as needed for congestion., Disp: , Rfl:  .  traMADol (ULTRAM) 50 MG tablet, Take 50 mg by mouth every 6 (six) hours as needed., Disp: , Rfl:  .  triamcinolone cream (KENALOG) 0.1 %, Apply 1 application topically 2 (two) times daily as needed., Disp: 30 g, Rfl: 0 .  Wound Dressings (ATRAPRO HYDROGEL EX), Apply 1 application topically as needed., Disp: , Rfl:  .  [DISCONTINUED] Calcium Carbonate-Vitamin D (CALCIUM-VITAMIN D) 500-200 MG-UNIT per tablet, Take 1 tablet by mouth 2 (two) times daily with a meal.  , Disp: , Rfl:  .  [DISCONTINUED] diltiazem (TIAZAC) 240 MG 24 hr capsule, Take 1 capsule (240 mg total) by mouth daily., Disp: 90 capsule, Rfl: 0 .  [DISCONTINUED] fexofenadine (ALLEGRA) 180 MG tablet, Take 1 tablet (180 mg total) by mouth daily., Disp: 15 tablet, Rfl: 0  EXAM:  Filed Vitals:   08/08/14 0851  BP: 122/80  Pulse: 112  Temp: 97.5 F (36.4 C)    Body mass index is 36.55 kg/(m^2).  GENERAL: vitals reviewed and listed above, alert, oriented, appears well hydrated and in no acute distress  HEENT: atraumatic, conjunttiva clear, no obvious abnormalities on inspection of external nose and ears  NECK: no obvious masses on  inspection  LUNGS: clear to auscultation bilaterally, no wheezes, rales or rhonchi, good air movement  CV: HRRR, no peripheral edema  SKIN: dry skin, a few patches of erythematous excoriated skin on arms and legs  MS: moves all extremities without noticeable abnormality  PSYCH: pleasant and cooperative, no obvious depression or anxiety  ASSESSMENT AND PLAN:  Discussed the following assessment and plan:  Eczema  Type 2 diabetes mellitus without complication - Plan: Hemoglobin A1C, Microalbumin/Creatinine Ratio, Urine  Hyperlipemia  MENTAL RETARDATION,  MODERATE  Essential hypertension - Plan: Basic metabolic panel  Breast cancer screening  -emmollient and steroid for likely eczematous rash, advised to follow up if persists -seeing Dr. Denman George for vulvar mass -check labs -mammo advised -lipids at follow up as not fasting today -vaccines and all HM measures offered -Patient advised to return or notify a doctor immediately if symptoms worsen or persist or new concerns arise.  Patient Instructions  BEFORE YOU LEAVE: -labs -please help her set up mammogram -please come fasting to your appointment in October  Please schedule your mammogram  DOVE unscented sensitive soap  CERAVE cream in a tub at least once daily  Use the triamcinilone cream twice daily for 2 weeks on the itchy patches     Cortney Beissel R.

## 2014-08-09 ENCOUNTER — Other Ambulatory Visit: Payer: Self-pay | Admitting: Family

## 2014-08-09 LAB — HIV ANTIBODY (ROUTINE TESTING W REFLEX): HIV: NONREACTIVE

## 2014-08-17 ENCOUNTER — Encounter: Payer: Self-pay | Admitting: Gynecologic Oncology

## 2014-08-17 ENCOUNTER — Ambulatory Visit: Payer: Medicare Other | Attending: Gynecologic Oncology | Admitting: Gynecologic Oncology

## 2014-08-17 VITALS — BP 139/60 | HR 60 | Temp 97.8°F | Resp 18 | Ht 62.0 in | Wt 200.8 lb

## 2014-08-17 DIAGNOSIS — N9089 Other specified noninflammatory disorders of vulva and perineum: Secondary | ICD-10-CM

## 2014-08-17 DIAGNOSIS — N949 Unspecified condition associated with female genital organs and menstrual cycle: Secondary | ICD-10-CM | POA: Insufficient documentation

## 2014-08-17 NOTE — Patient Instructions (Signed)
Plan for MRI of your pelvis 08/24/14 at 9am ([address] Once the results are back, we will call you and decide on a plan.

## 2014-08-17 NOTE — Progress Notes (Signed)
Consult Note: Gyn-Onc  Consult was requested by Dr. Talbert Thompson for the evaluation of Linda Thompson 59 y.o. female with a right vulvar mass  CC:  Chief Complaint  Patient presents with  . vulvar mass    New consult    Assessment/Plan:  Linda Thompson  is a 59 y.o.  year old with a soft right vulvar mass.  I suspect that this is most likely a lipoma. It is soft, somewhat well-circumscribed, and not associated with overlying epithelial abnormality. I have encountered lesions in the past which were more angioma-type lesions, however the avascular nature of this mass on ultrasound makes this less likely.  It is relatively asymptomatic. He is unclear how rapidly is growing in size. It is also unclear from a physical exam had deep this tissue is. Therefore it is hard for me to know how deep the resection would be necessary to resect this. Because my suspicion for malignancy is low, I would not want to perform a very radical procedure on this relatively asymptomatic mass. Therefore we will obtain an MRI of the pelvis to better delineate the extent of the lesion. If it is consistent with something that can be resected with a fairly low morbidity outpatient procedure we will schedule this. However if a more invasive radical resection is required, for example if the lesion is tracking up into the canal of [place] I would not recommend surgical excision for any symptomatic mass.   HPI: Linda Thompson is a very pleasant 59 year old G0 who is seen in consultation at the request of Dr. Talbert Thompson for right vulvar mass. The patient was being evaluated by her inmidlife Linda Thompson and on 07/27/2014 for routine evaluation and symptoms of DVT. During examination fullness in the right vulva was appreciated. To better evaluate the lesion and ultrasound was performed of the vulva. An area of less dense tissue measuring 2.4 x 1.5 x 2 cm was appreciated it was avascular, not well-circumscribed and was somewhat consistent with  a lipoma.  Upon further questioning the patient denies symptoms of pain. She is unclear exactly how long she's had this mass but certainly in the last month. She has a remote history of hysterectomy in 1994 for was sounds like fibroid tumors. She's had no other gynecologic surgery or complaints. She has some intellectual impairment over has a very high functional status. She lives in a assisted living facility somewhat independently.   Current Meds:  Outpatient Encounter Prescriptions as of 08/17/2014  Medication Sig  . aspirin EC 81 MG tablet Take 81 mg by mouth daily.  Marland Kitchen diltiazem (CARDIZEM CD) 240 MG 24 hr capsule TAKE ONE CAPSULE BY MOUTH EVERY DAY  . hydrochlorothiazide (MICROZIDE) 12.5 MG capsule TAKE 1 CAPSULE EVERY DAY  . ipratropium (ATROVENT) 0.06 % nasal spray PLACE 2 SPRAYS INTO BOTH NOSTRILS 4 (FOUR) TIMES DAILY.  Marland Kitchen ketoconazole (NIZORAL) 2 % shampoo APPLY TO AFFECTED AREA TWICE A WEEK AS DIRECTED  . latanoprost (XALATAN) 0.005 % ophthalmic solution Place 1 drop into both eyes at bedtime.   Marland Kitchen loratadine (CLARITIN) 10 MG tablet Take 10 mg by mouth daily.  . metFORMIN (GLUCOPHAGE) 500 MG tablet TAKE 1 TABLET BY MOUTH TWICE A DAY WITH A MEAL  . omeprazole (PRILOSEC) 20 MG capsule TAKE 1 CAPSULE (20 MG TOTAL) BY MOUTH 2 (TWO) TIMES DAILY.  Marland Kitchen potassium chloride SA (K-DUR,KLOR-CON) 20 MEQ tablet Take 1 tablet (20 mEq total) by mouth daily.  . pravastatin (PRAVACHOL) 40 MG tablet TAKE 1 TABLET  BY MOUTH EVERY EVENING  . Propylene Glycol (SYSTANE BALANCE OP) Apply 2 drops to eye 2 (two) times daily as needed (dry eyes).   . sodium chloride (OCEAN) 0.65 % nasal spray Place 1 spray into the nose as needed for congestion.  . triamcinolone cream (KENALOG) 0.1 % Apply 1 application topically 2 (two) times daily as needed.  . desonide (DESOWEN) 0.05 % ointment Apply 1 application topically 2 (two) times daily.  . traMADol (ULTRAM) 50 MG tablet Take 50 mg by mouth every 6 (six) hours as needed.  .  [DISCONTINUED] potassium chloride (KLOR-CON) 20 MEQ packet Take 20 mEq by mouth daily.  . [DISCONTINUED] Wound Dressings (ATRAPRO HYDROGEL EX) Apply 1 application topically as needed.   No facility-administered encounter medications on file as of 08/17/2014.    Allergy:  Allergies  Allergen Reactions  . Ace Inhibitors Cough  . Amoxicillin-Pot Clavulanate Diarrhea  . Augmentin [Amoxicillin-Pot Clavulanate] Other (See Comments)    diarrhea  . Azithromycin Diarrhea  . Ceftin [Cefuroxime Axetil]     diarrhea  . Cefuroxime Diarrhea and Other (See Comments)    Generalized weakness  . Cyclobenzaprine Hcl Diarrhea  . Flexeril [Cyclobenzaprine] Diarrhea  . Flonase [Fluticasone Propionate] Other (See Comments)    Nose bleed  . Ibuprofen Other (See Comments)    REACTION: vomiting    Social Hx:   History   Social History  . Marital Status: Single    Spouse Name: N/A  . Number of Children: 0  . Years of Education: N/A   Occupational History  . Disabled (from arm fracture)    Social History Main Topics  . Smoking status: Never Smoker   . Smokeless tobacco: Never Used  . Alcohol Use: No  . Drug Use: No  . Sexual Activity: Not Currently    Birth Control/ Protection: Surgical     Comment: Hyst--TAH--unsure if has ovaries   Other Topics Concern  . Not on file   Social History Narrative   Lives at [facility] (on [address] in [place])      [place] for arm fracture      Linda Thompson (neighbor) is POA    Past Surgical Hx:  Past Surgical History  Procedure Laterality Date  . Total abdominal hysterectomy  03/05/1992    leiomyoma and cellular leiomyoma  . Cataract extraction    . Elbow surgery      right elbow  . Lipoma excision Left [id number]    posterior left axilla    Past Medical Hx:  Past Medical History  Diagnosis Date  . Allergic rhinitis, cause unspecified   . Pain in joint, ankle and foot   . Backache, unspecified      chronic LBP with radiculopathy  . Seborrheic dermatitis, unspecified   . Type II or unspecified type diabetes mellitus without mention of complication, not stated as uncontrolled   . History of fracture of arm     Right  . History of fracture of foot   . Dermatomycosis, unspecified   . Esophageal reflux   . Unspecified glaucoma   . Undiagnosed cardiac murmurs   . Diaphragmatic hernia without mention of obstruction or gangrene   . Other and unspecified hyperlipidemia   . Unspecified essential hypertension   . Irritable bowel syndrome   . Benign neoplasm of cerebral meninges   . Moderate intellectual disabilities   . Nausea alone   . Osteoarthrosis, unspecified whether generalized or localized, ankle and foot   . Pruritus ani   .  Unspecified tinnitus   . Nonspecific elevation of levels of transaminase or lactic acid dehydrogenase (LDH)   . Leukorrhea, not specified as infective   . Obesity   . Hyperglycemia   . DDD (degenerative disc disease), lumbar   . Yeast infection of the skin     Frequent    Past Gynecological History:  See HPI  Patient's last menstrual period was 01/21/1992 (approximate).  Family Hx:  Family History  Problem Relation Age of Onset  . Diabetes Mother   . Stroke Mother   . Hypertension Mother   . Diabetes Maternal Aunt   . Heart disease Maternal Aunt      (had pacemaker)  . Diabetes Maternal Grandmother   . Stroke Maternal Grandmother   . Hypertension Maternal Grandmother   . Stroke Father     Review of Systems:  Constitutional  Feels well,    ENT Normal appearing ears and nares bilaterally Skin/Breast  No rash, sores, jaundice, itching, dryness Cardiovascular  No chest pain, shortness of breath, or edema  Pulmonary  No cough or wheeze.  Gastro Intestinal  No nausea, vomitting, or diarrhoea. No bright red blood per rectum, no abdominal pain, change in bowel movement, or constipation.  Genito Urinary  No frequency, urgency, dysuria,   Musculo Skeletal  No myalgia, arthralgia, joint swelling or pain  Neurologic  No weakness, numbness, change in gait,  Psychology  No depression, anxiety, insomnia.   Vitals:  Blood pressure 139/60, pulse 60, temperature 97.8 F (36.6 C), temperature source Oral, resp. rate 18, height 5\' 2"  (1.575 m), weight 200 lb 12.8 oz (91.082 kg), last menstrual period 01/21/1992, SpO2 99 %.  Physical Exam: WD in NAD Neck  Supple NROM, without any enlargements.  Lymph Node Survey No cervical supraclavicular or inguinal adenopathy Cardiovascular  Pulse normal rate, regularity and rhythm. S1 and S2 normal.  Lungs  Clear to auscultation bilateraly, without wheezes/crackles/rhonchi. Good air movement.  Skin  No rash/lesions/breakdown  Psychiatry  Alert and oriented to person, place, and time  Abdomen  Normoactive bowel sounds, abdomen soft, non-tender and obese without evidence of hernia.  Back No CVA tenderness Genito Urinary  Vulva/vagina: Rt vulvar mass 4-5cm, soft, compressible. Margins are appreciated at distal portion. Deep portion is less well circumscribed. Does not increase in volume with valsalva (it is not consistent with a hernia). Non tender.  Bladder/urethra:  No lesions or masses, well supported bladder  Vagina: normal  Cervix: surgically absent  Uterus: surgically absent  Adnexa: no palpable masses. Rectal  Good tone, no masses no cul de sac nodularity.  Extremities  No bilateral cyanosis, clubbing or edema.   Donaciano Eva, MD   08/17/2014, 1:14 PM

## 2014-08-24 ENCOUNTER — Ambulatory Visit (HOSPITAL_COMMUNITY)
Admission: RE | Admit: 2014-08-24 | Discharge: 2014-08-24 | Disposition: A | Discharge status: Home / Self Care | Payer: Medicare Other | Source: Ambulatory Visit | Attending: Gynecologic Oncology | Admitting: Gynecologic Oncology

## 2014-08-24 ENCOUNTER — Ambulatory Visit (INDEPENDENT_AMBULATORY_CARE_PROVIDER_SITE_OTHER): Payer: Medicare Other | Admitting: Podiatry

## 2014-08-24 ENCOUNTER — Telehealth: Payer: Self-pay | Admitting: *Deleted

## 2014-08-24 DIAGNOSIS — M899 Disorder of bone, unspecified: Secondary | ICD-10-CM | POA: Insufficient documentation

## 2014-08-24 DIAGNOSIS — M79673 Pain in unspecified foot: Secondary | ICD-10-CM

## 2014-08-24 DIAGNOSIS — N9089 Other specified noninflammatory disorders of vulva and perineum: Secondary | ICD-10-CM

## 2014-08-24 DIAGNOSIS — N750 Cyst of Bartholin's gland: Secondary | ICD-10-CM | POA: Insufficient documentation

## 2014-08-24 DIAGNOSIS — B351 Tinea unguium: Secondary | ICD-10-CM | POA: Diagnosis not present

## 2014-08-24 DIAGNOSIS — N949 Unspecified condition associated with female genital organs and menstrual cycle: Secondary | ICD-10-CM | POA: Diagnosis present

## 2014-08-24 MED ORDER — GADOBENATE DIMEGLUMINE 529 MG/ML IV SOLN
20.0000 mL | Freq: Once | INTRAVENOUS | Status: AC | PRN
  Administered 2014-08-24: 19 mL via INTRAVENOUS

## 2014-08-24 NOTE — Progress Notes (Signed)
Patient ID: Linda Thompson, female   DOB: 11-Sep-1955, 59 y.o.   MRN: [id number] Complaint:  Visit Type: Patient returns to my office for continued preventative foot care services. Complaint: Patient states" my nails have grown long and thick and become painful to walk and wear shoes" Patient has been diagnosed with DM with no foot complications. The patient presents for preventative foot care services. No changes to ROS  Podiatric Exam: Vascular: dorsalis pedis and posterior tibial pulses are palpable bilateral. Capillary return is immediate. Temperature gradient is WNL. Skin turgor WNL  Sensorium: Normal Semmes Weinstein monofilament test. Normal tactile sensation bilaterally. Nail Exam: Pt has thick disfigured discolored nails with subungual debris noted bilateral entire nail hallux through fifth toenails Ulcer Exam: There is no evidence of ulcer or pre-ulcerative changes or infection. Orthopedic Exam: Muscle tone and strength are WNL. No limitations in general ROM. No crepitus or effusions noted. Foot type and digits show no abnormalities. Bony prominences are unremarkable. Skin: No Porokeratosis. No infection or ulcers  Diagnosis:  Onychomycosis, , Pain in right toe, pain in left toes  Treatment & Plan Procedures and Treatment: Consent by patient was obtained for treatment procedures. The patient understood the discussion of treatment and procedures well. All questions were answered thoroughly reviewed. Debridement of mycotic and hypertrophic toenails, 1 through 5 bilateral and clearing of subungual debris. No ulceration, no infection noted.  Return Visit-Office Procedure: Patient instructed to return to the office for a follow up visit 3 months for continued evaluation and treatment.

## 2014-08-24 NOTE — Telephone Encounter (Signed)
Received call from [facility] with radiology calling report on MRI pelvis that patient had today. Copy of scan given to Joylene John, NP to review.

## 2014-08-28 ENCOUNTER — Telehealth: Payer: Self-pay | Admitting: *Deleted

## 2014-08-28 ENCOUNTER — Telehealth: Payer: Self-pay | Admitting: Gynecologic Oncology

## 2014-08-28 NOTE — Telephone Encounter (Signed)
Left message asking pt's POA, S. Flippin, to call the office to discuss MRI findings.  MRI results routed to Dr. Blima Singer, PCP, as well.

## 2014-08-28 NOTE — Telephone Encounter (Signed)
          Spoke with Linda Thompson about MRI results.  Per Dr. Denman George: nothing concerning for a vulvar cancer or lesion that requires surgery on the vulva, however, she needs further workup for her osseous lesions. Informed that the MRI results were routed to Dr. Maudie Mercury for further recommendations.  Advised to call for any questions or concerns.

## 2014-08-28 NOTE — Telephone Encounter (Signed)
Per Dr Maudie Mercury she will discuss this with the pt at her next office visit.

## 2014-08-28 NOTE — Telephone Encounter (Signed)
Returned call to S. Fillipin.  No answer.  Advised to please call the office.

## 2014-08-28 NOTE — Telephone Encounter (Signed)
-----   Message from [name], DO sent at 08/28/2014  8:30 AM EDT ----- Regarding: RE: need for handicapped placard Wendie Simmer,   Please let her know I reviewed her back xrays in the chart. She has mild arthritis and walking and exercise are advised as part of the treatment for this. Handicap parking is not recommended for this long term as walking and staying active is actually part of there treatment. Please let her know we can talk about it further at her next regular appointment and help her with her back issues if needed.Thanks.   ----- Message -----    From: Agnes Lawrence, CMA    Sent: 08/25/2014   7:57 AM      To: [name], DO Subject: need for handicapped placard                   Dr Kim-I talked to Ms Falin and she stated Dr [name] (previous PCP) gave her the handicapped placard years ago after she was seen by Dr Lorin Mercy (ortho) for her back and hip pain due to a fall-states now she has left leg pain and difficulty walking far.

## 2014-08-29 ENCOUNTER — Encounter: Payer: Self-pay | Admitting: Family Medicine

## 2014-08-29 ENCOUNTER — Telehealth: Payer: Self-pay | Admitting: *Deleted

## 2014-08-29 ENCOUNTER — Ambulatory Visit (INDEPENDENT_AMBULATORY_CARE_PROVIDER_SITE_OTHER): Payer: Medicare Other | Admitting: Family Medicine

## 2014-08-29 VITALS — BP 118/80 | HR 107 | Temp 97.8°F | Ht 62.0 in | Wt 199.5 lb

## 2014-08-29 DIAGNOSIS — M899 Disorder of bone, unspecified: Secondary | ICD-10-CM

## 2014-08-29 DIAGNOSIS — R937 Abnormal findings on diagnostic imaging of other parts of musculoskeletal system: Secondary | ICD-10-CM | POA: Diagnosis not present

## 2014-08-29 DIAGNOSIS — Z1239 Encounter for other screening for malignant neoplasm of breast: Secondary | ICD-10-CM

## 2014-08-29 DIAGNOSIS — M898X9 Other specified disorders of bone, unspecified site: Secondary | ICD-10-CM

## 2014-08-29 DIAGNOSIS — D649 Anemia, unspecified: Secondary | ICD-10-CM

## 2014-08-29 NOTE — Progress Notes (Signed)
Pre visit review using our clinic review tool, if applicable. No additional management support is needed unless otherwise documented below in the visit note. 

## 2014-08-29 NOTE — Progress Notes (Addendum)
HPI:  Linda Thompson is a prior patient of Dutch Quint whom left this practice. She is currently between PCPs, but plans to establish with me and is here at my request to discuss a recent MR scan. She was seeing gyn for vulvovaginitis and from review of her chart it appears it was felt she had a vulvar mass and was sent to see Dr. Denman George for this. Per a note I received from Dr. Denman George, she does not have any gynecological mass. However, incidentally, the MR complete 08/24/14 showed multiple abnormal radiological findings throughout the bony pelvis, femurs and spine that the radiologist suggests are concerning for multiple myeloma or metastatic breast cancer. She had a normal mammogram last year. She reports pain in the back and legs for many years, attributed to her known DDD with radiculopathy. On review of chart she also has a hx of currently well controlled diabetes, HTN, HLD, Obesity, GERD, IBS, meningioma, mental retardation, anemia and elevated platelets. She denies breast masses, nipple discharge, fevers, anorexia, bleeding, bruising, night sweats or weight loss. She reports a history of a lot of pain in her legs, hips and back chronically for a number of years and saw Dr. Lorin Mercy (orhto) for this remotely in the past. Reports she has not had much pain in the last few months. She does want [place] to complete a renewal for a handicap parking decal. Reports when has flares of her back pain she is unable to ambulate far and was told by her orthopedic doctor to use caution with ambulating during these times to avoid falls.  She requests that we contact Linda Thompson, her HCPOA, to discuss her MRI findings as well.   ROS: See pertinent positives and negatives per HPI.  Past Medical History  Diagnosis Date  . Allergic rhinitis, cause unspecified   . Pain in joint, ankle and foot   . Backache, unspecified     chronic LBP with radiculopathy  . Seborrheic dermatitis, unspecified   . Type II or unspecified type  diabetes mellitus without mention of complication, not stated as uncontrolled   . History of fracture of arm     Right  . History of fracture of foot   . Dermatomycosis, unspecified   . Esophageal reflux   . Unspecified glaucoma   . Undiagnosed cardiac murmurs   . Diaphragmatic hernia without mention of obstruction or gangrene   . Other and unspecified hyperlipidemia   . Unspecified essential hypertension   . Irritable bowel syndrome   . Benign neoplasm of cerebral meninges   . Moderate intellectual disabilities   . Nausea alone   . Osteoarthrosis, unspecified whether generalized or localized, ankle and foot   . Pruritus ani   . Unspecified tinnitus   . Nonspecific elevation of levels of transaminase or lactic acid dehydrogenase (LDH)   . Leukorrhea, not specified as infective   . Obesity   . Hyperglycemia   . DDD (degenerative disc disease), lumbar   . Yeast infection of the skin     Frequent    Past Surgical History  Procedure Laterality Date  . Total abdominal hysterectomy  03/05/1992    leiomyoma and cellular leiomyoma  . Cataract extraction    . Elbow surgery      right elbow  . Lipoma excision Left [id number]    posterior left axilla    Family History  Problem Relation Age of Onset  . Diabetes Mother   . Stroke Mother   . Hypertension Mother   .  Diabetes Maternal Aunt   . Heart disease Maternal Aunt      (had pacemaker)  . Diabetes Maternal Grandmother   . Stroke Maternal Grandmother   . Hypertension Maternal Grandmother   . Stroke Father     History   Social History  . Marital Status: Single    Spouse Name: N/A  . Number of Children: 0  . Years of Education: N/A   Occupational History  . Disabled (from arm fracture)    Social History Main Topics  . Smoking status: Never Smoker   . Smokeless tobacco: Never Used  . Alcohol Use: No  . Drug Use: No  . Sexual Activity: Not Currently    Birth Control/ Protection: Surgical     Comment:  Hyst--TAH--unsure if has ovaries   Other Topics Concern  . None   Social History Narrative   Lives at [facility] (on [address] in [place])      [place] for arm fracture      Ms. Thompson (neighbor) is POA     Current outpatient prescriptions:  .  aspirin EC 81 MG tablet, Take 81 mg by mouth daily., Disp: , Rfl:  .  desonide (DESOWEN) 0.05 % ointment, Apply 1 application topically 2 (two) times daily., Disp: , Rfl:  .  diltiazem (CARDIZEM CD) 240 MG 24 hr capsule, TAKE ONE CAPSULE BY MOUTH EVERY DAY, Disp: 90 capsule, Rfl: 3 .  hydrochlorothiazide (MICROZIDE) 12.5 MG capsule, TAKE 1 CAPSULE EVERY DAY, Disp: 90 capsule, Rfl: 0 .  ipratropium (ATROVENT) 0.06 % nasal spray, PLACE 2 SPRAYS INTO BOTH NOSTRILS 4 (FOUR) TIMES DAILY., Disp: 15 mL, Rfl: 5 .  ketoconazole (NIZORAL) 2 % shampoo, APPLY TO AFFECTED AREA TWICE A WEEK AS DIRECTED, Disp: 120 mL, Rfl: 1 .  latanoprost (XALATAN) 0.005 % ophthalmic solution, Place 1 drop into both eyes at bedtime. , Disp: , Rfl:  .  loratadine (CLARITIN) 10 MG tablet, Take 10 mg by mouth daily., Disp: , Rfl:  .  metFORMIN (GLUCOPHAGE) 500 MG tablet, TAKE 1 TABLET BY MOUTH TWICE A DAY WITH A MEAL, Disp: 180 tablet, Rfl: 0 .  omeprazole (PRILOSEC) 20 MG capsule, TAKE 1 CAPSULE (20 MG TOTAL) BY MOUTH 2 (TWO) TIMES DAILY., Disp: 180 capsule, Rfl: 3 .  potassium chloride SA (K-DUR,KLOR-CON) 20 MEQ tablet, Take 1 tablet (20 mEq total) by mouth daily., Disp: 90 tablet, Rfl: 3 .  pravastatin (PRAVACHOL) 40 MG tablet, TAKE 1 TABLET BY MOUTH EVERY EVENING, Disp: 90 tablet, Rfl: 2 .  Propylene Glycol (SYSTANE BALANCE OP), Apply 2 drops to eye 2 (two) times daily as needed (dry eyes). , Disp: , Rfl:  .  sodium chloride (OCEAN) 0.65 % nasal spray, Place 1 spray into the nose as needed for congestion., Disp: , Rfl:  .  traMADol (ULTRAM) 50 MG tablet, Take 50 mg by mouth every 6 (six) hours as needed., Disp: , Rfl:  .   triamcinolone cream (KENALOG) 0.1 %, Apply 1 application topically 2 (two) times daily as needed., Disp: 30 g, Rfl: 0 .  [DISCONTINUED] Calcium Carbonate-Vitamin D (CALCIUM-VITAMIN D) 500-200 MG-UNIT per tablet, Take 1 tablet by mouth 2 (two) times daily with a meal.  , Disp: , Rfl:  .  [DISCONTINUED] diltiazem (TIAZAC) 240 MG 24 hr capsule, Take 1 capsule (240 mg total) by mouth daily., Disp: 90 capsule, Rfl: 0 .  [DISCONTINUED] fexofenadine (ALLEGRA) 180 MG tablet, Take 1 tablet (180 mg total) by mouth daily., Disp: 15 tablet,  Rfl: 0  EXAM:  Filed Vitals:   08/29/14 1038  BP: 118/80  Pulse: 107  Temp: 97.8 F (36.6 C)    Body mass index is 36.48 kg/(m^2).  GENERAL: vitals reviewed and listed above, alert, oriented, appears well hydrated and in no acute distress  HEENT: atraumatic, conjunttiva clear, no obvious abnormalities on inspection of external nose and ears  NECK: no obvious masses on inspection  LUNGS: clear to auscultation bilaterally, no wheezes, rales or rhonchi, good air movement  CV: HRRR, no peripheral edema  BREAST: normal appearance bilat, no appreciable masses  MS: moves all extremities without noticeable abnormality, no bony TTP along her lumbar spine or pelvis  PSYCH: pleasant and cooperative, no obvious depression or anxiety  ASSESSMENT AND PLAN:  Discussed the following assessment and plan:  Anemia, unspecified anemia type - Plan: PE+Interp(Rfx IFE+FLC), S, Ambulatory referral to Oncology, CBC with Differential, CMP with eGFR  Abnormal MRI scan, bone - Plan: PE+Interp(Rfx IFE+FLC), S, Ambulatory referral to Oncology, CBC with Differential, CMP with eGFR  Bone pain - Plan: PE+Interp(Rfx IFE+FLC), S, Ambulatory referral to Oncology, CBC with Differential, CMP with eGFR  Breast cancer screening  -have discussed findings and radiologist's concerns with the patient, she currently is having minimal symptoms other then her chronic low back and leg pain that  she reports is improved from in the past -will get CBC, CMP, SPEP, light chains -mammogram given radiology comments, no findings on exam today - have asked assistant to help schedule prior to leaving today -referral to oncology - have asked assistant to try to help schedule this before pt leaves today -my assistant attempted to contact Linda Thompson, HCPOA per her request and will attempt ac=gain to contact her tomorrow -handicap decal form completed -Patient advised to return or notify a doctor immediately if symptoms worsen or persist or new concerns arise.  Patient Instructions  Please go to the lab before you leave  Please go to the appointments as scheduled       Brittnae Aschenbrenner R.

## 2014-08-29 NOTE — Patient Instructions (Addendum)
Please go to the lab before you leave  Please go to the appointments as scheduled  Follow up with me as scheduled and sooner if needed

## 2014-08-29 NOTE — Telephone Encounter (Signed)
Dr Maudie Mercury completed the form for a permanent handicapped placard and I mailed the original to the pts home address.  A copy was sent to be scanned.

## 2014-08-30 ENCOUNTER — Other Ambulatory Visit: Payer: Medicare Other

## 2014-08-30 ENCOUNTER — Telehealth: Payer: Self-pay | Admitting: Oncology

## 2014-08-30 DIAGNOSIS — M899 Disorder of bone, unspecified: Secondary | ICD-10-CM | POA: Diagnosis not present

## 2014-08-30 DIAGNOSIS — D649 Anemia, unspecified: Secondary | ICD-10-CM | POA: Diagnosis not present

## 2014-08-30 DIAGNOSIS — R937 Abnormal findings on diagnostic imaging of other parts of musculoskeletal system: Secondary | ICD-10-CM | POA: Diagnosis not present

## 2014-08-30 LAB — CBC WITH DIFFERENTIAL/PLATELET
BASOS ABS: 0 10*3/uL (ref 0.0–0.1)
Basophils Relative: 0.7 % (ref 0.0–3.0)
EOS ABS: 0.3 10*3/uL (ref 0.0–0.7)
Eosinophils Relative: 6.5 % — ABNORMAL HIGH (ref 0.0–5.0)
HCT: 35.3 % — ABNORMAL LOW (ref 36.0–46.0)
Hemoglobin: 10.4 g/dL — ABNORMAL LOW (ref 12.0–15.0)
Lymphocytes Relative: 17.9 % (ref 12.0–46.0)
Lymphs Abs: 0.9 10*3/uL (ref 0.7–4.0)
MCHC: 29.4 g/dL — AB (ref 30.0–36.0)
MONO ABS: 0.5 10*3/uL (ref 0.1–1.0)
Monocytes Relative: 9.9 % (ref 3.0–12.0)
NEUTROS ABS: 3.3 10*3/uL (ref 1.4–7.7)
Neutrophils Relative %: 65 % (ref 43.0–77.0)
Platelets: 471 10*3/uL — ABNORMAL HIGH (ref 150.0–400.0)
RBC: 5.26 Mil/uL — ABNORMAL HIGH (ref 3.87–5.11)
RDW: 18.6 % — AB (ref 11.5–15.5)
WBC: 5.2 10*3/uL (ref 4.0–10.5)

## 2014-08-30 NOTE — Telephone Encounter (Signed)
New patient appt-s/w patient POA [name] and gave np appt for 08/11 @ 1:30 w/Dr. Alen Blew. LM for patient notifying that I have spoken with Ms. Fliippin.  Referring Dr. [name] Dx- Abnormal MRI scan, Bone Pain, anemia

## 2014-08-31 ENCOUNTER — Encounter: Payer: Self-pay | Admitting: Oncology

## 2014-08-31 ENCOUNTER — Ambulatory Visit: Payer: Medicare Other

## 2014-08-31 ENCOUNTER — Telehealth: Payer: Self-pay | Admitting: Oncology

## 2014-08-31 ENCOUNTER — Ambulatory Visit (HOSPITAL_BASED_OUTPATIENT_CLINIC_OR_DEPARTMENT_OTHER): Payer: Medicare Other | Admitting: Oncology

## 2014-08-31 ENCOUNTER — Ambulatory Visit (HOSPITAL_COMMUNITY)
Admission: RE | Admit: 2014-08-31 | Discharge: 2014-08-31 | Disposition: A | Discharge status: Home / Self Care | Payer: Medicare Other | Source: Ambulatory Visit | Attending: Oncology | Admitting: Oncology

## 2014-08-31 VITALS — BP 129/76 | HR 100 | Temp 98.6°F | Resp 18 | Ht 62.0 in | Wt 200.6 lb

## 2014-08-31 DIAGNOSIS — C419 Malignant neoplasm of bone and articular cartilage, unspecified: Secondary | ICD-10-CM

## 2014-08-31 DIAGNOSIS — C7951 Secondary malignant neoplasm of bone: Secondary | ICD-10-CM | POA: Diagnosis not present

## 2014-08-31 DIAGNOSIS — M899 Disorder of bone, unspecified: Secondary | ICD-10-CM | POA: Diagnosis not present

## 2014-08-31 DIAGNOSIS — C801 Malignant (primary) neoplasm, unspecified: Secondary | ICD-10-CM | POA: Diagnosis not present

## 2014-08-31 DIAGNOSIS — R748 Abnormal levels of other serum enzymes: Secondary | ICD-10-CM

## 2014-08-31 DIAGNOSIS — D509 Iron deficiency anemia, unspecified: Secondary | ICD-10-CM

## 2014-08-31 DIAGNOSIS — M8580 Other specified disorders of bone density and structure, unspecified site: Secondary | ICD-10-CM | POA: Insufficient documentation

## 2014-08-31 LAB — COMPLETE METABOLIC PANEL WITH GFR
ALT: 23 U/L (ref 6–29)
AST: 20 U/L (ref 10–35)
Albumin: 3.9 g/dL (ref 3.6–5.1)
Alkaline Phosphatase: 152 U/L — ABNORMAL HIGH (ref 33–130)
BUN: 13 mg/dL (ref 7–25)
CO2: 21 mmol/L (ref 20–31)
CREATININE: 0.66 mg/dL (ref 0.50–0.99)
Calcium: 8.8 mg/dL (ref 8.6–10.4)
Chloride: 105 mmol/L (ref 98–110)
GFR, Est Non African American: >89 mL/min (ref >=60)
Glucose, Bld: 123 mg/dL — ABNORMAL HIGH (ref 65–99)
Potassium: 4.7 mmol/L (ref 3.5–5.3)
Sodium: 145 mmol/L (ref 135–146)
Total Bilirubin: 0.3 mg/dL (ref 0.2–1.2)
Total Protein: 6.8 g/dL (ref 6.1–8.1)

## 2014-08-31 NOTE — Progress Notes (Signed)
Checked in new pt with no financial concerns. °

## 2014-08-31 NOTE — Progress Notes (Signed)
Please see consult note.  

## 2014-08-31 NOTE — Telephone Encounter (Signed)
Pt confirmed labs/ov per 08/11 POF, gave pt avs and calendar.... KJ °

## 2014-08-31 NOTE — Consult Note (Signed)
Reason for Referral: Pelvic bone lesions.   HPI: 59 year old woman currently of [place] where she had moved recently. She does have a history of mild mental retardation but fairly functional. She has history of diabetes, irritable bowel syndrome and osteoarthritis. She was seen recently by her GYN for vulvovaginitis and there was concern about a possible vulvar mass. She was referred at that time to Dr. Denman George for GYN oncology opinion. There was no mass palpated at that time but an MR of the pelvis was obtained on 08/24/2014. The MRI showed abnormal bony lesions in the pelvis, femur and the spine. Concern for malignancy was raised and patient was evaluated by Dr. Maudie Mercury her new primary care provider. She had a CBC done on 08/29/2014 showed a hemoglobin of 10.4 and MCV 67 and elevated RDW of 18.6. The differential was otherwise normal. She did have some mild eosinophilia. Her chemistry showed a normal creatinine, albumen and total protein. Her alkaline phosphatase was elevated at 152. Her previous alkaline phosphatase was 104 in November 2015. Looking back at previous alkaline phosphatase was elevated dating back to at least 2008 and the number was 142 at that time. Her last mammogram was in 2015 and that is to be repeated in the near future. She also had a colonoscopy in the last 2 years that was unremarkable. She status post hysterectomy but unclear whether she had an oophorectomy. Despite these findings, she is relatively asymptomatic. She currently denies any bone pain. She have reported in the past back pain and lower extremity pain that completely feels pain-free at this time.  She does not report any headaches, blurry vision, syncope or seizures. She did not have any confusion or change in her mentation. She does not report any fevers, chills, sweats, weight loss, appetite changes or constitutional symptoms. She does not report any chest pain, palpitation, orthopnea or leg edema. She does not report any  cough, wheezing or hemoptysis. She does not report any nausea, vomiting, abdominal pain, early satiety, hematochezia, constipation or diarrhea. She does not report any frequency, urgency or hesitancy but does report incontinence. She does not report any lymphadenopathy, easy bruisability or petechiae. She does not report any mood problems such as depression or anxiety. She does not report any arthralgias or myalgias. She did not report any pathological fractures. She did not report any recurrent infections. Remaining review of systems unremarkable.   Past Medical History  Diagnosis Date  . Allergic rhinitis, cause unspecified   . Pain in joint, ankle and foot   . Backache, unspecified     chronic LBP with radiculopathy  . Seborrheic dermatitis, unspecified   . Type II or unspecified type diabetes mellitus without mention of complication, not stated as uncontrolled   . History of fracture of arm     Right  . History of fracture of foot   . Dermatomycosis, unspecified   . Esophageal reflux   . Unspecified glaucoma   . Undiagnosed cardiac murmurs   . Diaphragmatic hernia without mention of obstruction or gangrene   . Other and unspecified hyperlipidemia   . Unspecified essential hypertension   . Irritable bowel syndrome   . Benign neoplasm of cerebral meninges   . Moderate intellectual disabilities   . Nausea alone   . Osteoarthrosis, unspecified whether generalized or localized, ankle and foot   . Pruritus ani   . Unspecified tinnitus   . Nonspecific elevation of levels of transaminase or lactic acid dehydrogenase (LDH)   . Leukorrhea, not specified as  infective   . Obesity   . Hyperglycemia   . DDD (degenerative disc disease), lumbar   . Yeast infection of the skin     Frequent  :  Past Surgical History  Procedure Laterality Date  . Total abdominal hysterectomy  03/05/1992    leiomyoma and cellular leiomyoma  . Cataract extraction    . Elbow surgery      right elbow  . Lipoma  excision Left [id number]    posterior left axilla  :   Current outpatient prescriptions:  .  aspirin EC 81 MG tablet, Take 81 mg by mouth daily., Disp: , Rfl:  .  desonide (DESOWEN) 0.05 % ointment, Apply 1 application topically 2 (two) times daily., Disp: , Rfl:  .  diltiazem (CARDIZEM CD) 240 MG 24 hr capsule, TAKE ONE CAPSULE BY MOUTH EVERY DAY, Disp: 90 capsule, Rfl: 3 .  hydrochlorothiazide (MICROZIDE) 12.5 MG capsule, TAKE 1 CAPSULE EVERY DAY, Disp: 90 capsule, Rfl: 0 .  ipratropium (ATROVENT) 0.06 % nasal spray, PLACE 2 SPRAYS INTO BOTH NOSTRILS 4 (FOUR) TIMES DAILY., Disp: 15 mL, Rfl: 5 .  ketoconazole (NIZORAL) 2 % shampoo, APPLY TO AFFECTED AREA TWICE A WEEK AS DIRECTED, Disp: 120 mL, Rfl: 1 .  latanoprost (XALATAN) 0.005 % ophthalmic solution, Place 1 drop into both eyes at bedtime. , Disp: , Rfl:  .  loratadine (CLARITIN) 10 MG tablet, Take 10 mg by mouth daily., Disp: , Rfl:  .  metFORMIN (GLUCOPHAGE) 500 MG tablet, TAKE 1 TABLET BY MOUTH TWICE A DAY WITH A MEAL, Disp: 180 tablet, Rfl: 0 .  omeprazole (PRILOSEC) 20 MG capsule, TAKE 1 CAPSULE (20 MG TOTAL) BY MOUTH 2 (TWO) TIMES DAILY., Disp: 180 capsule, Rfl: 3 .  potassium chloride SA (K-DUR,KLOR-CON) 20 MEQ tablet, Take 1 tablet (20 mEq total) by mouth daily., Disp: 90 tablet, Rfl: 3 .  pravastatin (PRAVACHOL) 40 MG tablet, TAKE 1 TABLET BY MOUTH EVERY EVENING, Disp: 90 tablet, Rfl: 2 .  Propylene Glycol (SYSTANE BALANCE OP), Apply 2 drops to eye 2 (two) times daily as needed (dry eyes). , Disp: , Rfl:  .  sodium chloride (OCEAN) 0.65 % nasal spray, Place 1 spray into the nose as needed for congestion., Disp: , Rfl:  .  traMADol (ULTRAM) 50 MG tablet, Take 50 mg by mouth every 6 (six) hours as needed., Disp: , Rfl:  .  triamcinolone cream (KENALOG) 0.1 %, Apply 1 application topically 2 (two) times daily as needed., Disp: 30 g, Rfl: 0 .  [DISCONTINUED] Calcium Carbonate-Vitamin D (CALCIUM-VITAMIN D) 500-200 MG-UNIT per tablet, Take  1 tablet by mouth 2 (two) times daily with a meal.  , Disp: , Rfl:  .  [DISCONTINUED] diltiazem (TIAZAC) 240 MG 24 hr capsule, Take 1 capsule (240 mg total) by mouth daily., Disp: 90 capsule, Rfl: 0 .  [DISCONTINUED] fexofenadine (ALLEGRA) 180 MG tablet, Take 1 tablet (180 mg total) by mouth daily., Disp: 15 tablet, Rfl: 0:  Allergies  Allergen Reactions  . Ace Inhibitors Cough  . Amoxicillin-Pot Clavulanate Diarrhea  . Augmentin [Amoxicillin-Pot Clavulanate] Other (See Comments)    diarrhea  . Azithromycin Diarrhea  . Ceftin [Cefuroxime Axetil]     diarrhea  . Cefuroxime Diarrhea and Other (See Comments)    Generalized weakness  . Cyclobenzaprine Hcl Diarrhea  . Flexeril [Cyclobenzaprine] Diarrhea  . Flonase [Fluticasone Propionate] Other (See Comments)    Nose bleed  . Ibuprofen Other (See Comments)    REACTION: vomiting  :  Family History  Problem Relation Age of Onset  . Diabetes Mother   . Stroke Mother   . Hypertension Mother   . Diabetes Maternal Aunt   . Heart disease Maternal Aunt      (had pacemaker)  . Diabetes Maternal Grandmother   . Stroke Maternal Grandmother   . Hypertension Maternal Grandmother   . Stroke Father   :  Social History   Social History  . Marital Status: Single    Spouse Name: N/A  . Number of Children: 0  . Years of Education: N/A   Occupational History  . Disabled (from arm fracture)    Social History Main Topics  . Smoking status: Never Smoker   . Smokeless tobacco: Never Used  . Alcohol Use: No  . Drug Use: No  . Sexual Activity: Not Currently    Birth Control/ Protection: Surgical     Comment: Hyst--TAH--unsure if has ovaries   Other Topics Concern  . Not on file   Social History Narrative   Lives at [facility] (on [address] in [place])      [place] for arm fracture      [name] (neighbor) is POA  :  Pertinent items are noted in HPI.  Exam: Blood pressure 129/76,  pulse 100, temperature 98.6 F (37 C), temperature source Oral, resp. rate 18, height 5' 2" (1.575 m), weight 200 lb 9.6 oz (90.992 kg), last menstrual period 01/21/1992, SpO2 98 %. General appearance: alert and cooperative Head: Normocephalic, without obvious abnormality Throat: lips, mucosa, and tongue normal; teeth and gums normal Neck: no adenopathy Back: negative Resp: clear to auscultation bilaterally Chest wall: no tenderness Cardio: regular rate and rhythm, S1, S2 normal, no murmur, click, rub or gallop GI: soft, non-tender; bowel sounds normal; no masses,  no organomegaly Extremities: extremities normal, atraumatic, no cyanosis or edema Pulses: 2+ and symmetric Skin: Skin color, texture, turgor normal. No rashes or lesions Lymph nodes: Cervical, supraclavicular, and axillary nodes normal. Neurologic: Grossly normal   Recent Labs  08/29/14 1129  WBC 5.2  HGB 10.4 Repeated and verified X2.*  HCT 35.3 Repeated and verified X2.*  PLT 471.0*    Recent Labs  08/29/14 1132  NA 145  K 4.7  CL 105  CO2 21  GLUCOSE 123*  BUN 13  CREATININE 0.66  CALCIUM 8.8     Mr Pelvis W Wo Contrast  08/24/2014   CLINICAL DATA:  Evaluate right vulvar mass. Suspected to represent a lipoma.  EXAM: MRI PELVIS WITHOUT AND WITH CONTRAST  TECHNIQUE: Multiplanar multisequence MR imaging of the pelvis was performed both before and after administration of intravenous contrast.  CONTRAST:  47m MULTIHANCE GADOBENATE DIMEGLUMINE 529 MG/ML IV SOLN  COMPARISON:  CT abdomen pelvis 03/16/2006; MR lumbar spine 05/13/2008.  FINDINGS: Patient status post hysterectomy. No discrete vulvar mass is identified. Note is made of bilateral Bartholin gland cysts measuring 1.4 cm on the right and 1.2 cm on the left. The urinary bladder is unremarkable. No pelvic adenopathy. Left ovary is unremarkable. Right ovary is unremarkable. Normal appendix. Visualized bowel is unremarkable. Cystic lesions within the renal hila  bilaterally likely represent parapelvic cysts however are not completely characterized on current examination. Multiple small 3-5 mm enhancing lesions are demonstrated throughout the visualized osseous skeleton including the lower lumbar spine, pelvis and proximal femurs bilaterally.  IMPRESSION: Multiple small enhancing bone lesions are demonstrated throughout the visualized lumbar spine, pelvis and proximal femurs. Findings are worrisome for either metastatic disease (potentially in the  setting of breast carcinoma) or multiple myeloma.  No discrete vulvar mass is identified.  Bilateral bartholin gland cysts.  These results will be called to the ordering clinician or representative by the Radiologist Assistant, and communication documented in the PACS or zVision Dashboard.   Electronically Signed   By: Lovey Newcomer M.D.   On: 08/24/2014 10:28    Assessment and Plan:    59 year old with the following issues:   1. The multiple enhancing lesions noted incidentally in her pelvic bones, lumbar spine and femurs bilaterally. This was documented on MRI on 08/24/2014. The scan did not show any clear-cut malignancy at this time. It did show that she is status post hysterectomy and her ovaries are normal. There is no evidence of lymphadenopathy noted.  The differential diagnosis was discussed with the patient and her friend today. This would include multiple myeloma which can present with lytic bone lesions without solid tumor or lymphadenopathy. Metastatic tumor to the bone from a solid organ is also a possibility. That would include breast cancer, lung cancer. melanoma among others.  Benign etiologies could also be a possibility such as Paget's disease which might present in this fashion. This could also be a consideration given the fact that she is completely asymptomatic at this time. She is up-to-date an age appropriate cancer screening including mammography, colonoscopy, breast examination and pelvic  examination.  To work these findings up, I will await the results of his serum protein electrophoresis that has been already obtained I will also obtain a skeletal survey to rule out multiple myeloma.  I would like to obtain a PET CT scan as well to investigate a possible solid tumor malignancy with metastatic disease to the bone.  After the studies have been completed she might need a biopsy depending on these findings. If there is an obvious malignancy somewhere else a biopsy of that tumor will be done. If no definitive tumors are noted, a biopsy of the bone lesions would be the next step.  She will follow-up with me after these studies to discuss the next steps.  2. Elevated alkaline phosphatase: This has been chronic in nature and dating back to 2008. It could be related to her bone disease or liver disease. Her liver function tests and bilirubin all normal. We'll continue to take that into account as we are evaluating her for cancer.  3. Microcytic anemia: These findings suggest iron deficiency although she denied blood losses at this time. I will check iron studies in the near future and replace as needed.  4. Follow-up: We'll be the next few weeks after her workup is complete.

## 2014-09-01 ENCOUNTER — Encounter: Payer: Self-pay | Admitting: Family Medicine

## 2014-09-01 ENCOUNTER — Telehealth: Payer: Self-pay | Admitting: Family Medicine

## 2014-09-01 NOTE — Telephone Encounter (Signed)
Linda Thompson is return Linda Thompson.

## 2014-09-05 NOTE — Telephone Encounter (Signed)
Dr Maudie Mercury was given Mrs Linda Thompson's cell number of [phone] to call her back.

## 2014-09-06 DIAGNOSIS — Z1231 Encounter for screening mammogram for malignant neoplasm of breast: Secondary | ICD-10-CM | POA: Diagnosis not present

## 2014-09-06 LAB — HM MAMMOGRAPHY: HM Mammogram: NEGATIVE

## 2014-09-07 ENCOUNTER — Ambulatory Visit (INDEPENDENT_AMBULATORY_CARE_PROVIDER_SITE_OTHER): Payer: Medicare Other | Admitting: Family Medicine

## 2014-09-07 ENCOUNTER — Encounter: Payer: Self-pay | Admitting: Family Medicine

## 2014-09-07 VITALS — BP 118/82 | HR 111 | Temp 97.6°F | Ht 62.0 in | Wt 201.7 lb

## 2014-09-07 DIAGNOSIS — L989 Disorder of the skin and subcutaneous tissue, unspecified: Secondary | ICD-10-CM

## 2014-09-07 DIAGNOSIS — R238 Other skin changes: Secondary | ICD-10-CM

## 2014-09-07 NOTE — Progress Notes (Signed)
Pre visit review using our clinic review tool, if applicable. No additional management support is needed unless otherwise documented below in the visit note. 

## 2014-09-07 NOTE — Progress Notes (Signed)
HPI:  Perianal pruritis: -has been on and off for a long time, reports prior PCP told her to use wipes as she sometimes does not quite get all of the stool off -occurs day and night intermittently -denies pain with stool ing, pain in rectum, rash, changes in bowels   ROS: See pertinent positives and negatives per HPI.  Past Medical History  Diagnosis Date  . Allergic rhinitis, cause unspecified   . Pain in joint, ankle and foot   . Backache, unspecified     chronic LBP with radiculopathy  . Seborrheic dermatitis, unspecified   . Type II or unspecified type diabetes mellitus without mention of complication, not stated as uncontrolled   . History of fracture of arm     Right  . History of fracture of foot   . Dermatomycosis, unspecified   . Esophageal reflux   . Unspecified glaucoma   . Undiagnosed cardiac murmurs   . Diaphragmatic hernia without mention of obstruction or gangrene   . Other and unspecified hyperlipidemia   . Unspecified essential hypertension   . Irritable bowel syndrome   . Benign neoplasm of cerebral meninges   . Moderate intellectual disabilities   . Nausea alone   . Osteoarthrosis, unspecified whether generalized or localized, ankle and foot   . Pruritus ani   . Unspecified tinnitus   . Nonspecific elevation of levels of transaminase or lactic acid dehydrogenase (LDH)   . Leukorrhea, not specified as infective   . Obesity   . Hyperglycemia   . DDD (degenerative disc disease), lumbar   . Yeast infection of the skin     Frequent    Past Surgical History  Procedure Laterality Date  . Total abdominal hysterectomy  03/05/1992    leiomyoma and cellular leiomyoma  . Cataract extraction    . Elbow surgery      right elbow  . Lipoma excision Left [id number]    posterior left axilla    Family History  Problem Relation Age of Onset  . Diabetes Mother   . Stroke Mother   . Hypertension Mother   . Diabetes Maternal Aunt   . Heart disease Maternal Aunt       (had pacemaker)  . Diabetes Maternal Grandmother   . Stroke Maternal Grandmother   . Hypertension Maternal Grandmother   . Stroke Father     Social History   Social History  . Marital Status: Single    Spouse Name: N/A  . Number of Children: 0  . Years of Education: N/A   Occupational History  . Disabled (from arm fracture)    Social History Main Topics  . Smoking status: Never Smoker   . Smokeless tobacco: Never Used  . Alcohol Use: No  . Drug Use: No  . Sexual Activity: Not Currently    Birth Control/ Protection: Surgical     Comment: Hyst--TAH--unsure if has ovaries   Other Topics Concern  . None   Social History Narrative   Lives at [facility] (on [address] in [place])      [place] for arm fracture      [name] (neighbor) is POA     Current outpatient prescriptions:  .  aspirin EC 81 MG tablet, Take 81 mg by mouth daily., Disp: , Rfl:  .  desonide (DESOWEN) 0.05 % ointment, Apply 1 application topically 2 (two) times daily., Disp: , Rfl:  .  diltiazem (CARDIZEM CD) 240 MG 24 hr capsule, TAKE ONE CAPSULE BY  MOUTH EVERY DAY, Disp: 90 capsule, Rfl: 3 .  hydrochlorothiazide (MICROZIDE) 12.5 MG capsule, TAKE 1 CAPSULE EVERY DAY, Disp: 90 capsule, Rfl: 0 .  ipratropium (ATROVENT) 0.06 % nasal spray, PLACE 2 SPRAYS INTO BOTH NOSTRILS 4 (FOUR) TIMES DAILY., Disp: 15 mL, Rfl: 5 .  ketoconazole (NIZORAL) 2 % shampoo, APPLY TO AFFECTED AREA TWICE A WEEK AS DIRECTED, Disp: 120 mL, Rfl: 1 .  latanoprost (XALATAN) 0.005 % ophthalmic solution, Place 1 drop into both eyes at bedtime. , Disp: , Rfl:  .  loratadine (CLARITIN) 10 MG tablet, Take 10 mg by mouth daily., Disp: , Rfl:  .  metFORMIN (GLUCOPHAGE) 500 MG tablet, TAKE 1 TABLET BY MOUTH TWICE A DAY WITH A MEAL, Disp: 180 tablet, Rfl: 0 .  omeprazole (PRILOSEC) 20 MG capsule, TAKE 1 CAPSULE (20 MG TOTAL) BY MOUTH 2 (TWO) TIMES DAILY., Disp: 180 capsule, Rfl: 3 .   potassium chloride SA (K-DUR,KLOR-CON) 20 MEQ tablet, Take 1 tablet (20 mEq total) by mouth daily., Disp: 90 tablet, Rfl: 3 .  pravastatin (PRAVACHOL) 40 MG tablet, TAKE 1 TABLET BY MOUTH EVERY EVENING, Disp: 90 tablet, Rfl: 2 .  Propylene Glycol (SYSTANE BALANCE OP), Apply 2 drops to eye 2 (two) times daily as needed (dry eyes). , Disp: , Rfl:  .  sodium chloride (OCEAN) 0.65 % nasal spray, Place 1 spray into the nose as needed for congestion., Disp: , Rfl:  .  traMADol (ULTRAM) 50 MG tablet, Take 50 mg by mouth every 6 (six) hours as needed., Disp: , Rfl:  .  triamcinolone cream (KENALOG) 0.1 %, Apply 1 application topically 2 (two) times daily as needed., Disp: 30 g, Rfl: 0 .  [DISCONTINUED] Calcium Carbonate-Vitamin D (CALCIUM-VITAMIN D) 500-200 MG-UNIT per tablet, Take 1 tablet by mouth 2 (two) times daily with a meal.  , Disp: , Rfl:  .  [DISCONTINUED] diltiazem (TIAZAC) 240 MG 24 hr capsule, Take 1 capsule (240 mg total) by mouth daily., Disp: 90 capsule, Rfl: 0 .  [DISCONTINUED] fexofenadine (ALLEGRA) 180 MG tablet, Take 1 tablet (180 mg total) by mouth daily., Disp: 15 tablet, Rfl: 0  EXAM:  Filed Vitals:   09/07/14 0843  BP: 118/82  Pulse: 111  Temp: 97.6 F (36.4 C)    Body mass index is 36.88 kg/(m^2).  GENERAL: vitals reviewed and listed above, alert, oriented, appears well hydrated and in no acute distress  HEENT: atraumatic, conjunttiva clear, no obvious abnormalities on inspection of external nose and ears  NECK: no obvious masses on inspection  SKIN: sharply demarcated patch of irritated erythematous skin in perianal region   MS: moves all extremities without noticeable abnormality  PSYCH: pleasant and cooperative, no obvious depression or anxiety  ASSESSMENT AND PLAN:  Discussed the following assessment and plan:  Skin irritation  -query yeast - trail topical clotrimazole or monistat bid -close follow up in 3-4 weeks, may need derm eval if persists -seeing  onc for pet scan and follow up- appts set, pt aware of appointments -Patient advised to return or notify a doctor immediately if symptoms worsen or persist or new concerns arise.  Patient Instructions  Yeast cream (clotrimazole or monistat) twice daily  Keep area clean and dry  Wash only with water and can use toilet paper with a little water to wipe - but do not use wipes for now       [name] R.

## 2014-09-07 NOTE — Patient Instructions (Signed)
Yeast cream (clotrimazole or monistat) twice daily  Keep area clean and dry  Wash only with water and can use toilet paper with a little water to wipe - but do not use wipes for now

## 2014-09-08 ENCOUNTER — Encounter: Payer: Self-pay | Admitting: Family Medicine

## 2014-09-11 ENCOUNTER — Ambulatory Visit (HOSPITAL_COMMUNITY)
Admission: RE | Admit: 2014-09-11 | Discharge: 2014-09-11 | Disposition: A | Discharge status: Home / Self Care | Payer: Medicare Other | Source: Ambulatory Visit | Attending: Oncology | Admitting: Oncology

## 2014-09-11 DIAGNOSIS — C7951 Secondary malignant neoplasm of bone: Secondary | ICD-10-CM | POA: Insufficient documentation

## 2014-09-11 DIAGNOSIS — Z79899 Other long term (current) drug therapy: Secondary | ICD-10-CM | POA: Insufficient documentation

## 2014-09-11 DIAGNOSIS — C419 Malignant neoplasm of bone and articular cartilage, unspecified: Secondary | ICD-10-CM | POA: Diagnosis not present

## 2014-09-11 LAB — GLUCOSE, CAPILLARY: Glucose-Capillary: 111 mg/dL — ABNORMAL HIGH (ref 65–99)

## 2014-09-11 MED ORDER — FLUDEOXYGLUCOSE F - 18 (FDG) INJECTION
11.2000 | Freq: Once | INTRAVENOUS | Status: DC | PRN
  Administered 2014-09-11: 11.2 via INTRAVENOUS
  Filled 2014-09-11: qty 11.2

## 2014-09-13 ENCOUNTER — Ambulatory Visit (INDEPENDENT_AMBULATORY_CARE_PROVIDER_SITE_OTHER): Payer: Medicare Other

## 2014-09-13 DIAGNOSIS — Z23 Encounter for immunization: Secondary | ICD-10-CM | POA: Diagnosis not present

## 2014-09-21 ENCOUNTER — Telehealth: Payer: Self-pay | Admitting: Oncology

## 2014-09-21 ENCOUNTER — Ambulatory Visit (HOSPITAL_BASED_OUTPATIENT_CLINIC_OR_DEPARTMENT_OTHER): Payer: Medicare Other | Admitting: Oncology

## 2014-09-21 VITALS — BP 142/82 | HR 96 | Temp 98.0°F | Resp 18 | Ht 62.0 in | Wt 200.8 lb

## 2014-09-21 DIAGNOSIS — M899 Disorder of bone, unspecified: Secondary | ICD-10-CM | POA: Diagnosis not present

## 2014-09-21 DIAGNOSIS — R591 Generalized enlarged lymph nodes: Secondary | ICD-10-CM

## 2014-09-21 DIAGNOSIS — R599 Enlarged lymph nodes, unspecified: Secondary | ICD-10-CM

## 2014-09-21 NOTE — Telephone Encounter (Signed)
per pof to sch pt appt-gave pt copy of avs °

## 2014-09-21 NOTE — Progress Notes (Signed)
Hematology and Oncology Follow Up Visit  Linda Thompson [id number] [birth year] 59 y.o. 09/21/2014 12:01 PM Linda Thompson., DOKim, Linda Major, DO   Principle Diagnosis: 59 year old woman with diffuse lymphadenopathy and bone lesions suspicious for malignancy versus sarcoidosis. This was diagnosed in August 2016.   Interim History:  Linda Thompson presents today for a follow-up visit. Since the last visit, she continues to be completely asymptomatic. She does not report any back pain or shoulder pain. Has not reported any pathological fractures or hospitalizations. She continues to perform activities of daily living without any decline.  She does not report any headaches, blurry vision, syncope or seizures. She did not have any confusion or change in her mentation. She does not report any fevers, chills, sweats, weight loss, appetite changes or constitutional symptoms. She does not report any chest pain, palpitation, orthopnea or leg edema. She does not report any cough, wheezing or hemoptysis. She does not report any nausea, vomiting, abdominal pain, early satiety, hematochezia, constipation or diarrhea. She does not report any frequency, urgency or hesitancy but does report incontinence. She does not report any arthralgias or myalgias. She did not report any pathological fractures. She did not report any recurrent infections. Remaining review of systems unremarkable.   Medications: I have reviewed the patient's current medications.  Current Outpatient Prescriptions  Medication Sig Dispense Refill  . aspirin EC 81 MG tablet Take 81 mg by mouth daily.    Marland Kitchen desonide (DESOWEN) 0.05 % ointment Apply 1 application topically 2 (two) times daily.    Marland Kitchen diltiazem (CARDIZEM CD) 240 MG 24 hr capsule TAKE ONE CAPSULE BY MOUTH EVERY DAY 90 capsule 3  . hydrochlorothiazide (MICROZIDE) 12.5 MG capsule TAKE 1 CAPSULE EVERY DAY 90 capsule 0  . ipratropium (ATROVENT) 0.06 % nasal spray PLACE 2 SPRAYS INTO BOTH NOSTRILS 4  (FOUR) TIMES DAILY. 15 mL 5  . ketoconazole (NIZORAL) 2 % shampoo APPLY TO AFFECTED AREA TWICE A WEEK AS DIRECTED 120 mL 1  . latanoprost (XALATAN) 0.005 % ophthalmic solution Place 1 drop into both eyes at bedtime.     Marland Kitchen loratadine (CLARITIN) 10 MG tablet Take 10 mg by mouth daily.    . metFORMIN (GLUCOPHAGE) 500 MG tablet TAKE 1 TABLET BY MOUTH TWICE A DAY WITH A MEAL 180 tablet 0  . omeprazole (PRILOSEC) 20 MG capsule TAKE 1 CAPSULE (20 MG TOTAL) BY MOUTH 2 (TWO) TIMES DAILY. 180 capsule 3  . potassium chloride SA (K-DUR,KLOR-CON) 20 MEQ tablet Take 1 tablet (20 mEq total) by mouth daily. 90 tablet 3  . pravastatin (PRAVACHOL) 40 MG tablet TAKE 1 TABLET BY MOUTH EVERY EVENING 90 tablet 2  . Propylene Glycol (SYSTANE BALANCE OP) Apply 2 drops to eye 2 (two) times daily as needed (dry eyes).     . sodium chloride (OCEAN) 0.65 % nasal spray Place 1 spray into the nose as needed for congestion.    . traMADol (ULTRAM) 50 MG tablet Take 50 mg by mouth every 6 (six) hours as needed.    . triamcinolone cream (KENALOG) 0.1 % Apply 1 application topically 2 (two) times daily as needed. 30 g 0  . [DISCONTINUED] Calcium Carbonate-Vitamin D (CALCIUM-VITAMIN D) 500-200 MG-UNIT per tablet Take 1 tablet by mouth 2 (two) times daily with a meal.      . [DISCONTINUED] diltiazem (TIAZAC) 240 MG 24 hr capsule Take 1 capsule (240 mg total) by mouth daily. 90 capsule 0  . [DISCONTINUED] fexofenadine (ALLEGRA) 180 MG tablet Take 1 tablet (  180 mg total) by mouth daily. 15 tablet 0   No current facility-administered medications for this visit.     Allergies:  Allergies  Allergen Reactions  . Ace Inhibitors Cough  . Amoxicillin-Pot Clavulanate Diarrhea  . Augmentin [Amoxicillin-Pot Clavulanate] Other (See Comments)    diarrhea  . Azithromycin Diarrhea  . Ceftin [Cefuroxime Axetil]     diarrhea  . Cefuroxime Diarrhea and Other (See Comments)    Generalized weakness  . Cyclobenzaprine Hcl Diarrhea  .  Flexeril [Cyclobenzaprine] Diarrhea  . Flonase [Fluticasone Propionate] Other (See Comments)    Nose bleed  . Ibuprofen Other (See Comments)    REACTION: vomiting    Past Medical History, Surgical history, Social history, and Family History were reviewed and updated.  Physical Exam: Blood pressure 142/82, pulse 96, temperature 98 F (36.7 C), temperature source Oral, resp. rate 18, height $RemoveBe'5\' 2"'GvZrhIAdR$  (1.575 m), weight 200 lb 12.8 oz (91.082 kg), last menstrual period 01/21/1992, SpO2 99 %. ECOG: 1 General appearance: alert and cooperative Head: Normocephalic, without obvious abnormality Neck: no adenopathy Lymph nodes: Cervical, supraclavicular, and axillary nodes normal. Heart:regular rate and rhythm, S1, S2 normal, no murmur, click, rub or gallop Lung:chest clear, no wheezing, rales, normal symmetric air entry Abdomin: soft, non-tender, without masses or organomegaly EXT:no erythema, induration, or nodules   Lab Results: Lab Results  Component Value Date   WBC 5.2 08/29/2014   HGB 10.4 Repeated and verified X2.* 08/29/2014   HCT 35.3 Repeated and verified X2.* 08/29/2014   MCV 67.1 Repeated and verified X2.* 08/29/2014   PLT 471.0* 08/29/2014     Chemistry      Component Value Date/Time   NA 145 08/29/2014 1132   K 4.7 08/29/2014 1132   CL 105 08/29/2014 1132   CO2 21 08/29/2014 1132   BUN 13 08/29/2014 1132   CREATININE 0.66 08/29/2014 1132   CREATININE 0.62 08/08/2014 1012      Component Value Date/Time   CALCIUM 8.8 08/29/2014 1132   ALKPHOS 152* 08/29/2014 1132   AST 20 08/29/2014 1132   ALT 23 08/29/2014 1132   BILITOT 0.3 08/29/2014 1132       Radiological Studies:  DATA: Initial treatment strategy for primary bone cancer, histology not specified. Enhancing osseous lesions on pelvic MRI.  EXAM: NUCLEAR MEDICINE PET SKULL BASE TO THIGH  TECHNIQUE: 11.2 MCi F-18 FDG was injected intravenously. Full-ring PET imaging was performed from the skull base to  thigh after the radiotracer. CT data was obtained and used for attenuation correction and anatomic localization.  FASTING BLOOD GLUCOSE: Value: 111 mg/dl  COMPARISON: Bone survey 08/31/2014. MRI pelvis 08/24/2014.  FINDINGS: NECK  There is nodular hypermetabolic activity within the parotid glands bilaterally, with an SUV max of up to 9.1 on the right. This activity appears to correspond with small intra parotid lymph nodes. 10 mm left supraclavicular node on image 44 is hypermetabolic with an SUV max of 13.7. Prominent lymphoid activity is present within [facility] ring.There are no focal lesions of the pharyngeal mucosal space.  CHEST  There is intense hypermetabolic activity within multiple prominent to mildly enlarged mediastinal and hilar lymph nodes bilaterally. Hilar nodes demonstrate an SUV max of 11.8. Subcarinal nodes have an SUV max of 13.7. Subcarinal node measures 1.4 cm short axis on image 65. There are several hypermetabolic internal mammary lymph nodes bilaterally. No significant axillary nodal involvement. There is also prominent hypermetabolic activity extending along the bronchovascular bundles with associated nodularity. There are several hypermetabolic ill-defined pulmonary nodules  bilaterally. An example includes a 7 mm nodule in the right upper lobe on image 13 which has an SUV max of 3.6.  ABDOMEN/PELVIS  There is diffuse nodular hypermetabolic activity throughout the liver and spleen. The individual lesions are difficult to discern on noncontrast CT images. However, there is a lesion inferiorly in the right hepatic lobe with an SUV max of 9.6. Splenic activity demonstrates an SUV max of 18.0. The pancreas, adrenal glands and kidneys demonstrate no abnormal activity. There are several intensely hypermetabolic and mildly enlarged lymph nodes within the porta hepatis, upper retroperitoneum and mesentery. 13 mm portacaval node on image 104 has  an SUV max of 30.7. A 9 mm left inguinal node on image 170 active 9.3. No suspicious bowel activity.  SKELETON  Multifocal hypermetabolic lesions are present throughout the axial and proximal appendicular skeleton. There is involvement throughout the spine, ribs, pelvis, proximal humeri and femurs bilaterally. The individual lesions are not well demonstrated on the CT images. There is no evidence of pathologic fracture or epidural tumor.  IMPRESSION: 1. PET CT demonstrates widespread abnormalities with multiple hypermetabolic lymph nodes, nodular hypermetabolic activity within the lungs demonstrating a central distribution along the bronchovascular bundles, widespread nodular hypermetabolic activity within the liver and spleen and multiple hypermetabolic osseous lesions. 2. Given the widespread abnormalities and the patient's relative lack of symptoms, sarcoidosis favored. Malignancy cannot be completely excluded (given history: bone cancer), but this pattern would not be typical for multiple myeloma. If neoplastic, the primary considerations would be lymphoma and small cell lung cancer. 3. Tissue sampling recommended. Left supraclavicular and left inguinal nodes may be most amenable to excisional biopsy.     Impression and Plan:   59 year old with the following issues:   1. The multiple enhancing lesions noted incidentally in her pelvic bones, lumbar spine and femurs bilaterally. This was documented on MRI on 08/24/2014.  PET/CT was reviewed with radiology and discussed with the patient today. Although these findings suggest sarcoidosis rather than malignancy, a tissue biopsy will be needed. I will arrange for that in the near future to document indeed we are dealing with sarcoidosis rather than malignancy. Left supraclavicular and inguinal lymph node could be a potential areas that could be biopsied.  2. Follow-up will be after the results of the biopsy  done.   [facility], MD 9/1/201612:01 PM

## 2014-10-02 ENCOUNTER — Other Ambulatory Visit: Payer: Self-pay | Admitting: Radiology

## 2014-10-03 ENCOUNTER — Ambulatory Visit (HOSPITAL_COMMUNITY)
Admission: RE | Admit: 2014-10-03 | Discharge: 2014-10-03 | Disposition: A | Discharge status: Home / Self Care | Payer: Medicare Other | Source: Ambulatory Visit | Attending: Oncology | Admitting: Oncology

## 2014-10-03 ENCOUNTER — Encounter (HOSPITAL_COMMUNITY): Payer: Self-pay

## 2014-10-03 DIAGNOSIS — I889 Nonspecific lymphadenitis, unspecified: Secondary | ICD-10-CM | POA: Insufficient documentation

## 2014-10-03 DIAGNOSIS — E785 Hyperlipidemia, unspecified: Secondary | ICD-10-CM | POA: Insufficient documentation

## 2014-10-03 DIAGNOSIS — K219 Gastro-esophageal reflux disease without esophagitis: Secondary | ICD-10-CM | POA: Diagnosis not present

## 2014-10-03 DIAGNOSIS — E669 Obesity, unspecified: Secondary | ICD-10-CM | POA: Insufficient documentation

## 2014-10-03 DIAGNOSIS — R590 Localized enlarged lymph nodes: Secondary | ICD-10-CM | POA: Diagnosis not present

## 2014-10-03 DIAGNOSIS — Z7982 Long term (current) use of aspirin: Secondary | ICD-10-CM | POA: Diagnosis not present

## 2014-10-03 DIAGNOSIS — E119 Type 2 diabetes mellitus without complications: Secondary | ICD-10-CM | POA: Diagnosis not present

## 2014-10-03 DIAGNOSIS — K589 Irritable bowel syndrome without diarrhea: Secondary | ICD-10-CM | POA: Diagnosis not present

## 2014-10-03 DIAGNOSIS — I10 Essential (primary) hypertension: Secondary | ICD-10-CM | POA: Insufficient documentation

## 2014-10-03 DIAGNOSIS — I888 Other nonspecific lymphadenitis: Secondary | ICD-10-CM | POA: Diagnosis not present

## 2014-10-03 DIAGNOSIS — R591 Generalized enlarged lymph nodes: Secondary | ICD-10-CM | POA: Diagnosis present

## 2014-10-03 DIAGNOSIS — R599 Enlarged lymph nodes, unspecified: Secondary | ICD-10-CM

## 2014-10-03 LAB — CBC WITH DIFFERENTIAL/PLATELET
BASOS ABS: 0 10*3/uL (ref 0.0–0.1)
BASOS PCT: 0 % (ref 0–1)
Eosinophils Absolute: 0.3 10*3/uL (ref 0.0–0.7)
Eosinophils Relative: 5 % (ref 0–5)
HCT: 37.1 % (ref 36.0–46.0)
HEMOGLOBIN: 10 g/dL — AB (ref 12.0–15.0)
LYMPHS PCT: 17 % (ref 12–46)
Lymphs Abs: 0.9 10*3/uL (ref 0.7–4.0)
MCH: 19.3 pg — AB (ref 26.0–34.0)
MCHC: 27 g/dL — ABNORMAL LOW (ref 30.0–36.0)
MCV: 71.8 fL — ABNORMAL LOW (ref 78.0–100.0)
MONOS PCT: 12 % (ref 3–12)
Monocytes Absolute: 0.6 10*3/uL (ref 0.1–1.0)
NEUTROS ABS: 3.5 10*3/uL (ref 1.7–7.7)
NEUTROS PCT: 66 % (ref 43–77)
PLATELETS: 510 10*3/uL — AB (ref 150–400)
RBC: 5.17 MIL/uL — ABNORMAL HIGH (ref 3.87–5.11)
RDW: 17.5 % — ABNORMAL HIGH (ref 11.5–15.5)
WBC: 5.3 10*3/uL (ref 4.0–10.5)

## 2014-10-03 LAB — GLUCOSE, CAPILLARY: GLUCOSE-CAPILLARY: 123 mg/dL — AB (ref 65–99)

## 2014-10-03 LAB — BASIC METABOLIC PANEL
ANION GAP: 9 (ref 5–15)
BUN: 14 mg/dL (ref 6–20)
CHLORIDE: 107 mmol/L (ref 101–111)
CO2: 25 mmol/L (ref 22–32)
Calcium: 8.7 mg/dL — ABNORMAL LOW (ref 8.9–10.3)
Creatinine, Ser: 0.63 mg/dL (ref 0.44–1.00)
GFR calc non Af Amer: >60 mL/min (ref >60)
GLUCOSE: 123 mg/dL — AB (ref 65–99)
POTASSIUM: 3.9 mmol/L (ref 3.5–5.1)
Sodium: 141 mmol/L (ref 135–145)

## 2014-10-03 LAB — PROTIME-INR
INR: 0.99 (ref 0.00–1.49)
PROTHROMBIN TIME: 13.3 s (ref 11.6–15.2)

## 2014-10-03 MED ORDER — FENTANYL CITRATE (PF) 100 MCG/2ML IJ SOLN
INTRAMUSCULAR | Status: AC
  Filled 2014-10-03: qty 4

## 2014-10-03 MED ORDER — HYDROCODONE-ACETAMINOPHEN 5-325 MG PO TABS
1.0000 | ORAL_TABLET | ORAL | Status: DC | PRN
  Filled 2014-10-03: qty 2

## 2014-10-03 MED ORDER — MIDAZOLAM HCL 2 MG/2ML IJ SOLN
INTRAMUSCULAR | Status: AC
  Filled 2014-10-03: qty 6

## 2014-10-03 MED ORDER — SODIUM CHLORIDE 0.9 % IV SOLN
INTRAVENOUS | Status: DC
  Administered 2014-10-03: 12:00:00 via INTRAVENOUS

## 2014-10-03 MED ORDER — FENTANYL CITRATE (PF) 100 MCG/2ML IJ SOLN
INTRAMUSCULAR | Status: AC | PRN
  Administered 2014-10-03: 50 ug via INTRAVENOUS

## 2014-10-03 MED ORDER — MIDAZOLAM HCL 2 MG/2ML IJ SOLN
INTRAMUSCULAR | Status: AC | PRN
  Administered 2014-10-03: 0.5 mg via INTRAVENOUS
  Administered 2014-10-03: 1 mg via INTRAVENOUS

## 2014-10-03 NOTE — H&P (Signed)
Chief Complaint: Patient was seen in consultation today for US guided left supraclavicular lymph node biopsy   Referring Physician(s): Shadad,Firas N  History of Present Illness: Linda Thompson is a 59 y.o. female  with history of diffuse lymphadenopathy and bone lesions suspicious for malignancy versus sarcoidosis diagnosed in August 2016. She presents today for US guided biopsy of a left supraclavicular lymph node for further evaluation.   Past Medical History  Diagnosis Date  . Allergic rhinitis, cause unspecified   . Pain in joint, ankle and foot   . Backache, unspecified     chronic LBP with radiculopathy  . Seborrheic dermatitis, unspecified   . Type II or unspecified type diabetes mellitus without mention of complication, not stated as uncontrolled   . History of fracture of arm     Right  . History of fracture of foot   . Dermatomycosis, unspecified   . Esophageal reflux   . Unspecified glaucoma   . Undiagnosed cardiac murmurs   . Diaphragmatic hernia without mention of obstruction or gangrene   . Other and unspecified hyperlipidemia   . Unspecified essential hypertension   . Irritable bowel syndrome   . Benign neoplasm of cerebral meninges   . Moderate intellectual disabilities   . Nausea alone   . Osteoarthrosis, unspecified whether generalized or localized, ankle and foot   . Pruritus ani   . Unspecified tinnitus   . Nonspecific elevation of levels of transaminase or lactic acid dehydrogenase (LDH)   . Leukorrhea, not specified as infective   . Obesity   . Hyperglycemia   . DDD (degenerative disc disease), lumbar   . Yeast infection of the skin     Frequent    Past Surgical History  Procedure Laterality Date  . Total abdominal hysterectomy  03/05/1992    leiomyoma and cellular leiomyoma  . Cataract extraction    . Elbow surgery      right elbow  . Lipoma excision Left [id number]    posterior left axilla    Allergies: Ace inhibitors;  Amoxicillin-pot clavulanate; Augmentin; Azithromycin; Ceftin; Cefuroxime; Cyclobenzaprine hcl; Flexeril; Flonase; and Ibuprofen  Medications: Prior to Admission medications   Medication Sig Start Date End Date Taking? Authorizing Provider  aspirin EC 81 MG tablet Take 81 mg by mouth daily.   Yes Historical Provider, MD  desonide (DESOWEN) 0.05 % ointment Apply 1 application topically 2 (two) times daily as needed (irritation).    Yes Historical Provider, MD  diltiazem (CARDIZEM CD) 240 MG 24 hr capsule TAKE ONE CAPSULE BY MOUTH EVERY DAY Patient taking differently: TAKE 240 MG BY MOUTH DAILY 06/26/14  Yes [name], DO  hydrochlorothiazide (MICROZIDE) 12.5 MG capsule TAKE 1 CAPSULE EVERY DAY Patient taking differently: TAKE 12.5 MG BY MOUTH DAILY 07/31/14  Yes [name], DO  ipratropium (ATROVENT) 0.06 % nasal spray PLACE 2 SPRAYS INTO BOTH NOSTRILS 4 (FOUR) TIMES DAILY. Patient taking differently: PLACE 2 SPRAYS INTO BOTH NOSTRILS 4 (FOUR) TIMES DAILY AS NEEDED FOR CONGESTION 06/26/14  Yes [name], DO  ketoconazole (NIZORAL) 2 % shampoo APPLY TO AFFECTED AREA TWICE A WEEK AS DIRECTED 08/14/14  Yes [name], DO  latanoprost (XALATAN) 0.005 % ophthalmic solution Place 1 drop into both eyes at bedtime.  04/10/11  Yes Historical Provider, MD  loratadine (CLARITIN) 10 MG tablet Take 10 mg by mouth daily.   Yes Historical Provider, MD  metFORMIN (GLUCOPHAGE) 500 MG tablet TAKE 1 TABLET BY MOUTH TWICE A  DAY WITH A MEAL Patient taking differently: TAKE 500 MG BY MOUTH TWICE DAILY WITH A MEAL 06/26/14  Yes [name], DO  omeprazole (PRILOSEC) 20 MG capsule TAKE 1 CAPSULE (20 MG TOTAL) BY MOUTH 2 (TWO) TIMES DAILY. 06/26/14  Yes [name], DO  potassium chloride SA (K-DUR,KLOR-CON) 20 MEQ tablet Take 1 tablet (20 mEq total) by mouth daily. 11/23/13  Yes Kennyth Arnold, FNP  pravastatin (PRAVACHOL) 40 MG tablet TAKE 1 TABLET BY MOUTH EVERY EVENING Patient taking differently: TAKE 40 MG BY MOUTH  DAILY 06/26/14  Yes [name], DO  traMADol (ULTRAM) 50 MG tablet Take 50 mg by mouth every 6 (six) hours as needed for moderate pain or severe pain.    Yes Historical Provider, MD  triamcinolone cream (KENALOG) 0.1 % Apply 1 application topically 2 (two) times daily as needed. Patient taking differently: Apply 1 application topically 2 (two) times daily as needed (skin irritation).  06/23/14  Yes Brook Oletta Lamas, MD     Family History  Problem Relation Age of Onset  . Diabetes Mother   . Stroke Mother   . Hypertension Mother   . Diabetes Maternal Aunt   . Heart disease Maternal Aunt      (had pacemaker)  . Diabetes Maternal Grandmother   . Stroke Maternal Grandmother   . Hypertension Maternal Grandmother   . Stroke Father     Social History   Social History  . Marital Status: Single    Spouse Name: N/A  . Number of Children: 0  . Years of Education: N/A   Occupational History  . Disabled (from arm fracture)    Social History Main Topics  . Smoking status: Never Smoker   . Smokeless tobacco: Never Used  . Alcohol Use: No  . Drug Use: No  . Sexual Activity: Not Currently    Birth Control/ Protection: Surgical     Comment: Hyst--TAH--unsure if has ovaries   Other Topics Concern  . None   Social History Narrative   Lives at [facility] (on [address] in [place])      [place] for arm fracture      [name] (neighbor) is POA      Review of Systems  Constitutional: Negative for fever and chills.  Respiratory: Negative for cough and shortness of breath.   Cardiovascular: Negative for chest pain.  Gastrointestinal: Negative for nausea, vomiting, abdominal pain and blood in stool.  Genitourinary: Negative for dysuria and hematuria.  Musculoskeletal: Negative for back pain.  Neurological: Negative for headaches.    Vital Signs: BP 130/88 mmHg  Pulse 98  Temp(Src) 97.5 F (36.4 C) (Oral)  Resp 18  Ht $R'5\' 2"'eV$   (1.575 m)  Wt 200 lb 12.8 oz (91.082 kg)  BMI 36.72 kg/m2  SpO2 99%  LMP 01/21/1992 (Approximate)  Physical Exam  Constitutional: She is oriented to person, place, and time. She appears well-developed and well-nourished.  Cardiovascular: Normal rate and regular rhythm.   Pulmonary/Chest: Effort normal and breath sounds normal.  Abdominal: Soft. Bowel sounds are normal. There is no tenderness.  Musculoskeletal: Normal range of motion. She exhibits no edema.  Neurological: She is alert and oriented to person, place, and time.    Mallampati Score:     Imaging: Nm Pet Image Initial (pi) Skull Base To Thigh  09/11/2014   CLINICAL DATA:  Initial treatment strategy for primary bone cancer, histology not specified. Enhancing osseous lesions on pelvic MRI.  EXAM: NUCLEAR MEDICINE PET SKULL BASE TO THIGH  TECHNIQUE: 11.2 MCi F-18 FDG was injected intravenously. Full-ring PET imaging was performed from the skull base to thigh after the radiotracer. CT data was obtained and used for attenuation correction and anatomic localization.  FASTING BLOOD GLUCOSE:  Value: 111 mg/dl  COMPARISON:  Bone survey 08/31/2014.  MRI pelvis 08/24/2014.  FINDINGS: NECK  There is nodular hypermetabolic activity within the parotid glands bilaterally, with an SUV max of up to 9.1 on the right. This activity appears to correspond with small intra parotid lymph nodes. 10 mm left supraclavicular node on image 44 is hypermetabolic with an SUV max of 13.7. Prominent lymphoid activity is present within [facility] ring.There are no focal lesions of the pharyngeal mucosal space.  CHEST  There is intense hypermetabolic activity within multiple prominent to mildly enlarged mediastinal and hilar lymph nodes bilaterally. Hilar nodes demonstrate an SUV max of 11.8. Subcarinal nodes have an SUV max of 13.7. Subcarinal node measures 1.4 cm short axis on image 65. There are several hypermetabolic internal mammary lymph nodes bilaterally. No  significant axillary nodal involvement. There is also prominent hypermetabolic activity extending along the bronchovascular bundles with associated nodularity. There are several hypermetabolic ill-defined pulmonary nodules bilaterally. An example includes a 7 mm nodule in the right upper lobe on image 13 which has an SUV max of 3.6.  ABDOMEN/PELVIS  There is diffuse nodular hypermetabolic activity throughout the liver and spleen. The individual lesions are difficult to discern on noncontrast CT images. However, there is a lesion inferiorly in the right hepatic lobe with an SUV max of 9.6. Splenic activity demonstrates an SUV max of 18.0. The pancreas, adrenal glands and kidneys demonstrate no abnormal activity. There are several intensely hypermetabolic and mildly enlarged lymph nodes within the porta hepatis, upper retroperitoneum and mesentery. 13 mm portacaval node on image 104 has an SUV max of 30.7. A 9 mm left inguinal node on image 170 active 9.3. No suspicious bowel activity.  SKELETON  Multifocal hypermetabolic lesions are present throughout the axial and proximal appendicular skeleton. There is involvement throughout the spine, ribs, pelvis, proximal humeri and femurs bilaterally. The individual lesions are not well demonstrated on the CT images. There is no evidence of pathologic fracture or epidural tumor.  IMPRESSION: 1. PET CT demonstrates widespread abnormalities with multiple hypermetabolic lymph nodes, nodular hypermetabolic activity within the lungs demonstrating a central distribution along the bronchovascular bundles, widespread nodular hypermetabolic activity within the liver and spleen and multiple hypermetabolic osseous lesions. 2. Given the widespread abnormalities and the patient's relative lack of symptoms, sarcoidosis favored. Malignancy cannot be completely excluded (given history: bone cancer), but this pattern would not be typical for multiple myeloma. If neoplastic, the primary  considerations would be lymphoma and small cell lung cancer. 3. Tissue sampling recommended. Left supraclavicular and left inguinal nodes may be most amenable to excisional biopsy.   Electronically Signed   By: Richardean Sale M.D.   On: 09/11/2014 16:05    Labs:  CBC:  Recent Labs  12/12/13 0930 08/29/14 1129  WBC 6.8 5.2  HGB 11.0* 10.4 Repeated and verified X2.*  HCT 38.1 35.3 Repeated and verified X2.*  PLT 502.0* 471.0*    COAGS:  Recent Labs  10/03/14 1130  INR 0.99    BMP:  Recent Labs  12/12/13 0930 08/08/14 1012 08/29/14 1132  NA 140 142 145  K 3.4* 3.8 4.7  CL 103 105 105  CO2 $Re'27 28 21  'liy$ GLUCOSE 119* 98  123*  BUN $Re'13 13 13  'vcd$ CALCIUM 8.8 9.1 8.8  CREATININE 0.7 0.62 0.66  GFRNONAA  --   --  >89  GFRAA  --   --  >89    LIVER FUNCTION TESTS:  Recent Labs  12/12/13 0930 08/29/14 1132  BILITOT 0.2 0.3  AST 42* 20  ALT 35 23  ALKPHOS 104 152*  PROT 7.2 6.8  ALBUMIN 3.7 3.9    TUMOR MARKERS: No results for input(s): AFPTM, CEA, CA199, CHROMGRNA in the last 8760 hours.  Assessment and Plan: Linda Thompson is a 59 y.o. female  with history of diffuse lymphadenopathy and bone lesions suspicious for malignancy versus sarcoidosis diagnosed in August 2016. She presents today for US guided biopsy of a left supraclavicular lymph node for further evaluation.Risks and benefits discussed with the patient/POA including, but not limited to bleeding, infection, damage to adjacent structures or low yield requiring additional tests.All of the patient's questions were answered, patient is agreeable to proceed.Consent signed and in chart.     Thank you for this interesting consult.  I greatly enjoyed meeting CLOTEAL ISAACSON and look forward to participating in their care.  A copy of this report was sent to the requesting provider on this date.  Signed: D. Rowe Robert 10/03/2014, 12:32 PM   Approximately 15 minutes were spent evaluating patient  face to face in  clinical consultation, greater than 50% of which was counseling/coordinating care for

## 2014-10-03 NOTE — Procedures (Signed)
Interventional Radiology Procedure Note  Procedure:  US guided core biopsy LEFT supraclavicular LN.  44B cores x 5  Complications: None  Estimated Blood Loss: 0  Recommendations: - Bedrest x 1 hr - [place] pending  Signed,  Criselda Peaches, MD

## 2014-10-03 NOTE — Discharge Instructions (Signed)
Needle Biopsy Care After These instructions give you information on caring for yourself after your procedure. Your doctor may also give you more specific instructions. Call your doctor if you have any problems or questions after your procedure. HOME CARE  Rest for 4 hours after your biopsy, except for getting up to go to the bathroom or as told.  Keep the places where the needles were put in clean and dry.  Do not put powder or lotion on the sites.  Do not shower until 24 hours after the test. Remove all bandages (dressings) before showering.  Remove all bandages at least once every day. Gently clean the sites with soap and water. Keep putting a new bandage on until the skin is closed. Finding out the results of your test Ask your doctor when your test results will be ready. Make sure you follow up and get the test results. GET HELP RIGHT AWAY IF:   You have shortness of breath or trouble breathing.  You have pain or cramping in your belly (abdomen).  You feel sick to your stomach (nauseous) or throw up (vomit).  Any of the places where the needles were put in:  Are puffy (swollen) or red.  Are sore or hot to the touch.  Are draining yellowish-white fluid (pus).  Are bleeding after 10 minutes of pressing down on the site. Have someone keep pressing on any place that is bleeding until you see a doctor.  You have any unusual pain that will not stop.  You have a fever. If you go to the emergency room, tell the nurse that you had a biopsy. Take this paper with you to show the nurse. MAKE SURE YOU:   Understand these instructions.  Will watch your condition.  Will get help right away if you are not doing well or get worse. Document Released: 12/20/2007 Document Revised: 03/31/2011 Document Reviewed: 12/20/2007 [facility] Patient Information 2015 [place], [place]. This information is not intended to replace advice given to you by your health care provider. Make sure you discuss  any questions you have with your health care provider. Conscious Sedation Sedation is the use of medicines to promote relaxation and relieve discomfort and anxiety. Conscious sedation is a type of sedation. Under conscious sedation you are less alert than normal but are still able to respond to instructions or stimulation. Conscious sedation is used during short medical and dental procedures. It is milder than deep sedation or general anesthesia and allows you to return to your regular activities sooner.  LET [facility] CARE PROVIDER KNOW ABOUT:   Any allergies you have.  All medicines you are taking, including vitamins, herbs, eye drops, creams, and over-the-counter medicines.  Use of steroids (by mouth or creams).  Previous problems you or members of your family have had with the use of anesthetics.  Any blood disorders you have.  Previous surgeries you have had.  Medical conditions you have.  Possibility of pregnancy, if this applies.  Use of cigarettes, alcohol, or illegal drugs. RISKS AND COMPLICATIONS Generally, this is a safe procedure. However, as with any procedure, problems can occur. Possible problems include:  Oversedation.  Trouble breathing on your own. You may need to have a breathing tube until you are awake and breathing on your own.  Allergic reaction to any of the medicines used for the procedure. BEFORE THE PROCEDURE  You may have blood tests done. These tests can help show how well your kidneys and liver are working. They can also  show how well your blood clots.  A physical exam will be done.  Only take medicines as directed by your health care provider. You may need to stop taking medicines (such as blood thinners, aspirin, or nonsteroidal anti-inflammatory drugs) before the procedure.   Do not eat or drink at least 6 hours before the procedure or as directed by your health care provider.  Arrange for a responsible adult, family member, or friend to  take you home after the procedure. He or she should stay with you for at least 24 hours after the procedure, until the medicine has worn off. PROCEDURE   An intravenous (IV) catheter will be inserted into one of your veins. Medicine will be able to flow directly into your body through this catheter. You may be given medicine through this tube to help prevent pain and help you relax.  The medical or dental procedure will be done. AFTER THE PROCEDURE  You will stay in a recovery area until the medicine has worn off. Your blood pressure and pulse will be checked.   Depending on the procedure you had, you may be allowed to go home when you can tolerate liquids and your pain is under control. Document Released: 10/01/2000 Document Revised: 01/11/2013 Document Reviewed: 09/13/2012 [facility] Patient Information 2015 [place], [place]. This information is not intended to replace advice given to you by your health care provider. Make sure you discuss any questions you have with your health care provider. Conscious Sedation Sedation is the use of medicines to promote relaxation and relieve discomfort and anxiety. Conscious sedation is a type of sedation. Under conscious sedation you are less alert than normal but are still able to respond to instructions or stimulation. Conscious sedation is used during short medical and dental procedures. It is milder than deep sedation or general anesthesia and allows you to return to your regular activities sooner.  LET [facility] CARE PROVIDER KNOW ABOUT:   Any allergies you have.  All medicines you are taking, including vitamins, herbs, eye drops, creams, and over-the-counter medicines.  Use of steroids (by mouth or creams).  Previous problems you or members of your family have had with the use of anesthetics.  Any blood disorders you have.  Previous surgeries you have had.  Medical conditions you have.  Possibility of pregnancy, if this applies.  Use of  cigarettes, alcohol, or illegal drugs. RISKS AND COMPLICATIONS Generally, this is a safe procedure. However, as with any procedure, problems can occur. Possible problems include:  Oversedation.  Trouble breathing on your own. You may need to have a breathing tube until you are awake and breathing on your own.  Allergic reaction to any of the medicines used for the procedure. BEFORE THE PROCEDURE  You may have blood tests done. These tests can help show how well your kidneys and liver are working. They can also show how well your blood clots.  A physical exam will be done.  Only take medicines as directed by your health care provider. You may need to stop taking medicines (such as blood thinners, aspirin, or nonsteroidal anti-inflammatory drugs) before the procedure.   Do not eat or drink at least 6 hours before the procedure or as directed by your health care provider.  Arrange for a responsible adult, family member, or friend to take you home after the procedure. He or she should stay with you for at least 24 hours after the procedure, until the medicine has worn off. PROCEDURE   An intravenous (  IV) catheter will be inserted into one of your veins. Medicine will be able to flow directly into your body through this catheter. You may be given medicine through this tube to help prevent pain and help you relax.  The medical or dental procedure will be done. AFTER THE PROCEDURE  You will stay in a recovery area until the medicine has worn off. Your blood pressure and pulse will be checked.   Depending on the procedure you had, you may be allowed to go home when you can tolerate liquids and your pain is under control. Document Released: 10/01/2000 Document Revised: 01/11/2013 Document Reviewed: 09/13/2012 [facility] Patient Information 2015 [place], [place]. This information is not intended to replace advice given to you by your health care provider. Make sure you discuss any questions  you have with your health care provider.

## 2014-10-14 ENCOUNTER — Other Ambulatory Visit: Payer: Self-pay | Admitting: Family Medicine

## 2014-10-19 ENCOUNTER — Ambulatory Visit (HOSPITAL_BASED_OUTPATIENT_CLINIC_OR_DEPARTMENT_OTHER): Payer: Medicare Other | Admitting: Oncology

## 2014-10-19 VITALS — BP 125/60 | HR 106 | Temp 97.9°F | Resp 18 | Ht 62.0 in | Wt 201.8 lb

## 2014-10-19 DIAGNOSIS — R599 Enlarged lymph nodes, unspecified: Secondary | ICD-10-CM

## 2014-10-19 DIAGNOSIS — R591 Generalized enlarged lymph nodes: Secondary | ICD-10-CM

## 2014-10-19 NOTE — Progress Notes (Signed)
Hematology and Oncology Follow Up Visit  Linda Thompson [id number] September 22, 1955 59 y.o. 10/19/2014 3:50 PM Linda Thompson., DOKim, Nickola Major, DO   Principle Diagnosis: 59 year old woman with diffuse lymphadenopathy and bone lesions suspicious for malignancy versus sarcoidosis. This was diagnosed in August 2016.  She is status post lymph node biopsy completed on 10/03/2014 which confirmed the presence of noncaseating granulomatous disease supporting diagnosis of sarcoidosis.    Interim History:  [name] presents today for a follow-up visit. Since the last visit, she underwent a lymph node biopsy on 10/03/2014 and tolerated it well. She did not have any complications afterwards. shecontinues to be completely asymptomatic. She does not report any back pain or shoulder pain. Has not reported any pathological fractures or hospitalizations. She continues to perform activities of daily living without any decline.  She does not report any headaches, blurry vision, syncope or seizures. She does not report any fevers, chills, sweats, weight loss or appetite changes  She does not report any chest pain, palpitation, orthopnea or leg edema. She does not report any cough, wheezing or hemoptysis. She does not report any nausea, vomiting, abdominal pain, or early satiety. She does not report any frequency, urgency or hesitancy but does report incontinence. She does not report any arthralgias or myalgias. She did not report any pathological fractures. She did not report any recurrent infections. Remaining review of systems unremarkable.   Medications: I have reviewed the patient's current medications.  Current Outpatient Prescriptions  Medication Sig Dispense Refill  . aspirin EC 81 MG tablet Take 81 mg by mouth daily.    Marland Kitchen desonide (DESOWEN) 0.05 % ointment Apply 1 application topically 2 (two) times daily as needed (irritation).     Marland Kitchen diltiazem (CARDIZEM CD) 240 MG 24 hr capsule TAKE ONE CAPSULE BY MOUTH EVERY DAY  (Patient taking differently: TAKE 240 MG BY MOUTH DAILY) 90 capsule 3  . hydrochlorothiazide (MICROZIDE) 12.5 MG capsule TAKE 1 CAPSULE EVERY DAY (Patient taking differently: TAKE 12.5 MG BY MOUTH DAILY) 90 capsule 0  . ipratropium (ATROVENT) 0.06 % nasal spray PLACE 2 SPRAYS INTO BOTH NOSTRILS 4 (FOUR) TIMES DAILY. (Patient taking differently: PLACE 2 SPRAYS INTO BOTH NOSTRILS 4 (FOUR) TIMES DAILY AS NEEDED FOR CONGESTION) 15 mL 5  . ketoconazole (NIZORAL) 2 % shampoo APPLY TO AFFECTED AREA TWICE A WEEK AS DIRECTED 120 mL 1  . latanoprost (XALATAN) 0.005 % ophthalmic solution Place 1 drop into both eyes at bedtime.     Marland Kitchen loratadine (CLARITIN) 10 MG tablet Take 10 mg by mouth daily.    . metFORMIN (GLUCOPHAGE) 500 MG tablet TAKE 1 TABLET BY MOUTH TWICE A DAY WITH A MEAL (Patient taking differently: TAKE 500 MG BY MOUTH TWICE DAILY WITH A MEAL) 180 tablet 0  . nystatin (MYCOSTATIN/NYSTOP) 100000 UNIT/GM POWD Take as directed    . omeprazole (PRILOSEC) 20 MG capsule TAKE 1 CAPSULE (20 MG TOTAL) BY MOUTH 2 (TWO) TIMES DAILY. 180 capsule 3  . potassium chloride SA (K-DUR,KLOR-CON) 20 MEQ tablet Take 1 tablet (20 mEq total) by mouth daily. 90 tablet 3  . pravastatin (PRAVACHOL) 40 MG tablet TAKE 1 TABLET BY MOUTH EVERY EVENING (Patient taking differently: TAKE 40 MG BY MOUTH DAILY) 90 tablet 2  . traMADol (ULTRAM) 50 MG tablet Take 50 mg by mouth every 6 (six) hours as needed for moderate pain or severe pain.     Marland Kitchen triamcinolone cream (KENALOG) 0.1 % Apply 1 application topically 2 (two) times daily as needed. (Patient  taking differently: Apply 1 application topically 2 (two) times daily as needed (skin irritation). ) 30 g 0  . [DISCONTINUED] Calcium Carbonate-Vitamin D (CALCIUM-VITAMIN D) 500-200 MG-UNIT per tablet Take 1 tablet by mouth 2 (two) times daily with a meal.      . [DISCONTINUED] diltiazem (TIAZAC) 240 MG 24 hr capsule Take 1 capsule (240 mg total) by mouth daily. 90 capsule 0  .  [DISCONTINUED] fexofenadine (ALLEGRA) 180 MG tablet Take 1 tablet (180 mg total) by mouth daily. 15 tablet 0   No current facility-administered medications for this visit.     Allergies:  Allergies  Allergen Reactions  . Ace Inhibitors Cough  . Amoxicillin-Pot Clavulanate Diarrhea  . Augmentin [Amoxicillin-Pot Clavulanate] Other (See Comments)    diarrhea  . Azithromycin Diarrhea  . Ceftin [Cefuroxime Axetil]     diarrhea  . Cefuroxime Diarrhea and Other (See Comments)    Generalized weakness  . Cyclobenzaprine Hcl Diarrhea  . Flexeril [Cyclobenzaprine] Diarrhea  . Flonase [Fluticasone Propionate] Other (See Comments)    Nose bleed  . Ibuprofen Other (See Comments)    REACTION: vomiting    Past Medical History, Surgical history, Social history, and Family History were reviewed and updated.  Physical Exam: Blood pressure 125/60, pulse 106, temperature 97.9 F (36.6 C), temperature source Oral, resp. rate 18, height 5\' 2"  (1.575 m), weight 201 lb 12.8 oz (91.536 kg), last menstrual period 01/21/1992, SpO2 98 %. ECOG: 1 General appearance: alert and cooperative Not in any distress.  Head: Normocephalic, without obvious abnormality Neck: no adenopathy Lymph nodes: Cervical, supraclavicular, and axillary nodes normal. Heart:regular rate and rhythm, S1, S2 normal, no murmur, click, rub or gallop Lung:chest clear, no wheezing, rales, normal symmetric air entry  Abdomin: soft, non-tender, without masses or organomegaly no shifting dullness or ascites. EXT:no erythema, induration, or nodules   Lab Results: Lab Results  Component Value Date   WBC 5.3 10/03/2014   HGB 10.0* 10/03/2014   HCT 37.1 10/03/2014   MCV 71.8* 10/03/2014   PLT 510* 10/03/2014     Chemistry      Component Value Date/Time   NA 141 10/03/2014 1130   K 3.9 10/03/2014 1130   CL 107 10/03/2014 1130   CO2 25 10/03/2014 1130   BUN 14 10/03/2014 1130   CREATININE 0.63 10/03/2014 1130   CREATININE 0.66  08/29/2014 1132      Component Value Date/Time   CALCIUM 8.7* 10/03/2014 1130   ALKPHOS 152* 08/29/2014 1132   AST 20 08/29/2014 1132   ALT 23 08/29/2014 1132   BILITOT 0.3 08/29/2014 1132       Radiological Studies:  DATA: Initial treatment strategy for primary bone cancer, histology not specified. Enhancing osseous lesions on pelvic MRI.  EXAM: NUCLEAR MEDICINE PET SKULL BASE TO THIGH  TECHNIQUE: 11.2 MCi F-18 FDG was injected intravenously. Full-ring PET imaging was performed from the skull base to thigh after the radiotracer. CT data was obtained and used for attenuation correction and anatomic localization.  FASTING BLOOD GLUCOSE: Value: 111 mg/dl  COMPARISON: Bone survey 08/31/2014. MRI pelvis 08/24/2014.  FINDINGS: NECK  There is nodular hypermetabolic activity within the parotid glands bilaterally, with an SUV max of up to 9.1 on the right. This activity appears to correspond with small intra parotid lymph nodes. 10 mm left supraclavicular node on image 44 is hypermetabolic with an SUV max of 13.7. Prominent lymphoid activity is present within [facility] ring.There are no focal lesions of the pharyngeal mucosal space.  CHEST  There is intense hypermetabolic activity within multiple prominent to mildly enlarged mediastinal and hilar lymph nodes bilaterally. Hilar nodes demonstrate an SUV max of 11.8. Subcarinal nodes have an SUV max of 13.7. Subcarinal node measures 1.4 cm short axis on image 65. There are several hypermetabolic internal mammary lymph nodes bilaterally. No significant axillary nodal involvement. There is also prominent hypermetabolic activity extending along the bronchovascular bundles with associated nodularity. There are several hypermetabolic ill-defined pulmonary nodules bilaterally. An example includes a 7 mm nodule in the right upper lobe on image 13 which has an SUV max of 3.6.  ABDOMEN/PELVIS  There is diffuse  nodular hypermetabolic activity throughout the liver and spleen. The individual lesions are difficult to discern on noncontrast CT images. However, there is a lesion inferiorly in the right hepatic lobe with an SUV max of 9.6. Splenic activity demonstrates an SUV max of 18.0. The pancreas, adrenal glands and kidneys demonstrate no abnormal activity. There are several intensely hypermetabolic and mildly enlarged lymph nodes within the porta hepatis, upper retroperitoneum and mesentery. 13 mm portacaval node on image 104 has an SUV max of 30.7. A 9 mm left inguinal node on image 170 active 9.3. No suspicious bowel activity.  SKELETON  Multifocal hypermetabolic lesions are present throughout the axial and proximal appendicular skeleton. There is involvement throughout the spine, ribs, pelvis, proximal humeri and femurs bilaterally. The individual lesions are not well demonstrated on the CT images. There is no evidence of pathologic fracture or epidural tumor.  IMPRESSION: 1. PET CT demonstrates widespread abnormalities with multiple hypermetabolic lymph nodes, nodular hypermetabolic activity within the lungs demonstrating a central distribution along the bronchovascular bundles, widespread nodular hypermetabolic activity within the liver and spleen and multiple hypermetabolic osseous lesions. 2. Given the widespread abnormalities and the patient's relative lack of symptoms, sarcoidosis favored. Malignancy cannot be completely excluded (given history: bone cancer), but this pattern would not be typical for multiple myeloma. If neoplastic, the primary considerations would be lymphoma and small cell lung cancer. 3. Tissue sampling recommended. Left supraclavicular and left inguinal nodes may be most amenable to excisional biopsy.     Impression and Plan:   59 year old with the following issues:   1. The multiple enhancing lesions noted incidentally in her pelvic bones,  lumbar spine and femurs bilaterally. This was documented on MRI on 08/24/2014.  PET/CT  Results as well as the pathology results from 10/03/2014 were reviewed with the patient and her friend. I see no evidence to suggest cancer or lymphoma at this time. Her lymphadenopathy is due to sarcoidosis which confirmed by biopsy. He is completely asymptomatic at this time and has no complaints.   From an oncology standpoint, she does not require any intervention or follow-up. I will defer any further management of her sarcoidosis to her primary care physician as they feel fit. She might need referral to rheumatology or pulmonary in the future if she becomes symptomatic.   2. Microcytic anemia: likely related due to mild iron deficiency and can benefit from iron replacement in the future.  3. Follow-up will be as needed moving forward. I'll be happy to see her in the future if there is any other issues arise.   Zola Button, MD 9/29/20163:50 PM

## 2014-10-27 ENCOUNTER — Telehealth: Payer: Self-pay | Admitting: Family Medicine

## 2014-10-27 ENCOUNTER — Ambulatory Visit (INDEPENDENT_AMBULATORY_CARE_PROVIDER_SITE_OTHER): Payer: Medicare Other | Admitting: Family Medicine

## 2014-10-27 ENCOUNTER — Encounter: Payer: Self-pay | Admitting: Family Medicine

## 2014-10-27 ENCOUNTER — Ambulatory Visit: Payer: Medicare Other | Admitting: Family

## 2014-10-27 VITALS — BP 122/82 | HR 103 | Temp 97.5°F | Ht 62.0 in | Wt 201.8 lb

## 2014-10-27 DIAGNOSIS — K056 Periodontal disease, unspecified: Secondary | ICD-10-CM | POA: Diagnosis not present

## 2014-10-27 DIAGNOSIS — D869 Sarcoidosis, unspecified: Secondary | ICD-10-CM | POA: Diagnosis not present

## 2014-10-27 DIAGNOSIS — D509 Iron deficiency anemia, unspecified: Secondary | ICD-10-CM

## 2014-10-27 NOTE — Telephone Encounter (Signed)
I spoke with the pt and scheduled an appt for today at 10:45am.

## 2014-10-27 NOTE — Progress Notes (Signed)
HPI:  Acute visit for:  Gum pain: -L lower gums - started a few days ago -fews like it radiates to her ear -saw her dentist and is having dental work done next Friday but wanted to check to see if she has an ear infection -denies: fevers, sore throat, hearing loss, malaise  Sarcoidosis: -workup for difuse LAD and bony lesions in 2016 with oncology with MRI, PET and lymph node biospy confirmed sarcoidosis -she has chronic pain in the low back and hips -denies: SOB, DOE, cough, fevers, malaise, weight loss  Iron def anemia: -heme/onc advised iron replacement -denies:melena, hematochezia, bleeding -colonoscopy in 2013  ROS: See pertinent positives and negatives per HPI.  Past Medical History  Diagnosis Date  . Type II or unspecified type diabetes mellitus without mention of complication, not stated as uncontrolled   . Hypertension   . Hyperlipemia   . GERD (gastroesophageal reflux disease)     w/ hx diaphragmatic hernia  . Sarcoidosis ([place])     difuse bony lesions and LDA, dx by biopsy in 2016  . Osteoarthrosis, unspecified whether generalized or localized, ankle and foot   . DDD (degenerative disc disease), lumbar   . Glaucoma   . IBS (irritable bowel syndrome)   . Meningioma ([place])   . Moderate intellectual disabilities   . Transaminitis   . Obesity   . Chronic low back pain   . Allergic rhinitis   . Hx of fracture     R arm, foot  . Heart murmur     Past Surgical History  Procedure Laterality Date  . Total abdominal hysterectomy  03/05/1992    leiomyoma and cellular leiomyoma  . Cataract extraction    . Elbow surgery      right elbow  . Lipoma excision Left [id number]    posterior left axilla    Family History  Problem Relation Age of Onset  . Diabetes Mother   . Stroke Mother   . Hypertension Mother   . Diabetes Maternal Aunt   . Heart disease Maternal Aunt      (had pacemaker)  . Diabetes Maternal Grandmother   . Stroke Maternal Grandmother   .  Hypertension Maternal Grandmother   . Stroke Father     Social History   Social History  . Marital Status: Single    Spouse Name: N/A  . Number of Children: 0  . Years of Education: N/A   Occupational History  . Disabled (from arm fracture)    Social History Main Topics  . Smoking status: Never Smoker   . Smokeless tobacco: Never Used  . Alcohol Use: No  . Drug Use: No  . Sexual Activity: Not Currently    Birth Control/ Protection: Surgical     Comment: Hyst--TAH--unsure if has ovaries   Other Topics Concern  . None   Social History Narrative   Lives at [facility] (on [address] in [place])      [place] for arm fracture      [name] (neighbor) is POA     Current outpatient prescriptions:  .  acetaminophen (TYLENOL) 500 MG tablet, Take 500 mg by mouth as needed (left ear pain)., Disp: , Rfl:  .  aspirin EC 81 MG tablet, Take 81 mg by mouth daily., Disp: , Rfl:  .  diltiazem (CARDIZEM CD) 240 MG 24 hr capsule, TAKE ONE CAPSULE BY MOUTH EVERY DAY (Patient taking differently: TAKE 240 MG BY MOUTH DAILY), Disp: 90 capsule, Rfl: 3 .  hydrochlorothiazide (MICROZIDE) 12.5 MG capsule, TAKE 1 CAPSULE EVERY DAY (Patient taking differently: TAKE 12.5 MG BY MOUTH DAILY), Disp: 90 capsule, Rfl: 0 .  ketoconazole (NIZORAL) 2 % shampoo, APPLY TO AFFECTED AREA TWICE A WEEK AS DIRECTED, Disp: 120 mL, Rfl: 1 .  latanoprost (XALATAN) 0.005 % ophthalmic solution, Place 1 drop into both eyes at bedtime. , Disp: , Rfl:  .  loratadine (CLARITIN) 10 MG tablet, Take 10 mg by mouth daily., Disp: , Rfl:  .  metFORMIN (GLUCOPHAGE) 500 MG tablet, TAKE 1 TABLET BY MOUTH TWICE A DAY WITH A MEAL (Patient taking differently: TAKE 500 MG BY MOUTH TWICE DAILY WITH A MEAL), Disp: 180 tablet, Rfl: 0 .  nystatin (MYCOSTATIN/NYSTOP) 100000 UNIT/GM POWD, Take as directed, Disp: , Rfl:  .  omeprazole (PRILOSEC) 20 MG capsule, TAKE 1 CAPSULE (20 MG TOTAL) BY MOUTH 2  (TWO) TIMES DAILY., Disp: 180 capsule, Rfl: 3 .  potassium chloride SA (K-DUR,KLOR-CON) 20 MEQ tablet, Take 1 tablet (20 mEq total) by mouth daily., Disp: 90 tablet, Rfl: 3 .  pravastatin (PRAVACHOL) 40 MG tablet, TAKE 1 TABLET BY MOUTH EVERY EVENING (Patient taking differently: TAKE 40 MG BY MOUTH DAILY), Disp: 90 tablet, Rfl: 2 .  triamcinolone cream (KENALOG) 0.1 %, Apply 1 application topically 2 (two) times daily as needed. (Patient taking differently: Apply 1 application topically 2 (two) times daily as needed (skin irritation). ), Disp: 30 g, Rfl: 0 .  [DISCONTINUED] Calcium Carbonate-Vitamin D (CALCIUM-VITAMIN D) 500-200 MG-UNIT per tablet, Take 1 tablet by mouth 2 (two) times daily with a meal.  , Disp: , Rfl:  .  [DISCONTINUED] diltiazem (TIAZAC) 240 MG 24 hr capsule, Take 1 capsule (240 mg total) by mouth daily., Disp: 90 capsule, Rfl: 0 .  [DISCONTINUED] fexofenadine (ALLEGRA) 180 MG tablet, Take 1 tablet (180 mg total) by mouth daily., Disp: 15 tablet, Rfl: 0  EXAM:  Filed Vitals:   10/27/14 1027  BP: 122/82  Pulse: 103  Temp: 97.5 F (36.4 C)    Body mass index is 36.9 kg/(m^2).  GENERAL: vitals reviewed and listed above, alert, oriented, appears well hydrated and in no acute distress  HEENT: atraumatic, conjunttiva clear, no obvious abnormalities on inspection of external nose and ears, mild irritation around gums possible small ulcer L lower inner lip, no findings on otoscopic exam, no TMJ crepitus, no signs of bacterial infection or abscess  NECK: no obvious masses on inspection  LUNGS: clear to auscultation bilaterally, no wheezes, rales or rhonchi, good air movement  CV: HRRR, no peripheral edema  MS: moves all extremities without noticeable abnormality  PSYCH: pleasant and cooperative, no obvious depression or anxiety  ASSESSMENT AND PLAN:  Discussed the following assessment and plan:  Sore gums -mouth swish and follow up if persists  Sarcoidosis  ([place]) -discussed her chronic pain and sarcoid and opted to refer to rheum for management, no pulmonary symptoms  Iron deficiency anemia -start daily low dose iron suplement  -follow up for physical and in 1 week if mouth pain persists -Patient advised to return or notify a doctor immediately if symptoms worsen or persist or new concerns arise.  Patient Instructions  BEFORE YOU LEAVE: -cancel appointment in a few days -schedule 30 minute appointment in 3 months  Mix hydrogen peroxide mouth rinse (available over the counter) half and half with water and swish in mouth then spit out once daily for 1 week. Follow up with me if the mouth pain persists.  We placed a referral  for you as discussed to the rheumatologist regarding your sarcoidosis. It usually takes about 1-2 weeks to process and schedule this referral. If you have not heard from [place] regarding this appointment in 2 weeks please contact our office.  Please stop the nasal spray  Please start an over the counter iron supplement once daily       KIM, HANNAH R.

## 2014-10-27 NOTE — Telephone Encounter (Signed)
[facility] Night - Client TELEPHONE [facility] Patient Name: Linda Thompson Gender: Female DOB: 03-27-1955 Age: 59 Y 2 M 24 D Return Phone Number: [phone] (Primary) Address: [address] City/State/Zip: [name] [address] Client [name] Night - Client Client Site [facility] - Night Physician [name] Contact Type Call Call Type Triage / Clinical Relationship To Patient Self Return Phone Number [phone] (Primary) Chief Complaint Ear Fullness or Congestion Initial Comment Caller states left ear is bothering her- wouldn't elaborate No Triage Reason Patient declined Nurse Assessment Nurse: [name], RN, [name] Date/Time (Eastern Time): 10/27/2014 12:49:53 AM Confirm and document reason for call. If symptomatic, describe symptoms. ---Caller states she is having some left ear problems. Has the patient traveled out of the country within the last 30 days? ---Not Applicable Does the patient have any new or worsening symptoms? ---Yes Will a triage be completed? ---No Select reason for no triage. ---Patient declined Please document clinical information provided and list any resource used. ---Patient declined triage services. States she would like to know if Dr. Maudie Mercury has an opening available for Friday. Encouraged to call the office in the am for possible appointment.

## 2014-10-27 NOTE — Progress Notes (Signed)
Pre visit review using our clinic review tool, if applicable. No additional management support is needed unless otherwise documented below in the visit note. 

## 2014-10-27 NOTE — Patient Instructions (Signed)
BEFORE YOU LEAVE: -cancel appointment in a few days -schedule 30 minute appointment in 3 months  Mix hydrogen peroxide mouth rinse (available over the counter) half and half with water and swish in mouth then spit out once daily for 1 week. Follow up with me if the mouth pain persists.  We placed a referral for you as discussed to the rheumatologist regarding your sarcoidosis. It usually takes about 1-2 weeks to process and schedule this referral. If you have not heard from [place] regarding this appointment in 2 weeks please contact our office.  Please stop the nasal spray  Please start an over the counter iron supplement once daily

## 2014-10-27 NOTE — Telephone Encounter (Signed)
Per Dr Maudie Mercury, pt needs an appt.  I left a message for the pt to return my call.

## 2014-10-29 ENCOUNTER — Telehealth: Payer: Self-pay | Admitting: Family Medicine

## 2014-10-30 ENCOUNTER — Ambulatory Visit (INDEPENDENT_AMBULATORY_CARE_PROVIDER_SITE_OTHER): Payer: Medicare Other | Admitting: Family Medicine

## 2014-10-30 ENCOUNTER — Ambulatory Visit: Payer: Medicare Other | Admitting: Family Medicine

## 2014-10-30 ENCOUNTER — Telehealth: Payer: Self-pay | Admitting: *Deleted

## 2014-10-30 ENCOUNTER — Encounter: Payer: Self-pay | Admitting: Family Medicine

## 2014-10-30 VITALS — BP 120/80 | HR 93 | Temp 97.5°F | Ht 62.0 in | Wt 202.0 lb

## 2014-10-30 DIAGNOSIS — F71 Moderate intellectual disabilities: Secondary | ICD-10-CM | POA: Diagnosis not present

## 2014-10-30 DIAGNOSIS — R591 Generalized enlarged lymph nodes: Secondary | ICD-10-CM

## 2014-10-30 DIAGNOSIS — R599 Enlarged lymph nodes, unspecified: Secondary | ICD-10-CM

## 2014-10-30 DIAGNOSIS — H9202 Otalgia, left ear: Secondary | ICD-10-CM | POA: Diagnosis not present

## 2014-10-30 DIAGNOSIS — D869 Sarcoidosis, unspecified: Secondary | ICD-10-CM | POA: Diagnosis not present

## 2014-10-30 NOTE — Progress Notes (Signed)
HPI:   Acute visit for:  L ear pain: -reports persistent pain in lower posterior mouth, L ear -reports mouth wash did not help -s/p L supraclavicular lymph node biopsy 10/03/14 for difuse activity in parotids, lymph nodes and elsewhere with ne dx of sarcoid - referred to rheum for management -denies: worsening, fevers, malaise, sore throat, hearing loss, tooth pain -seeing dentist  ROS: See pertinent positives and negatives per HPI.  Past Medical History  Diagnosis Date  . Type II or unspecified type diabetes mellitus without mention of complication, not stated as uncontrolled   . Hypertension   . Hyperlipemia   . GERD (gastroesophageal reflux disease)     w/ hx diaphragmatic hernia  . Sarcoidosis ([place])     difuse bony lesions and LDA, dx by biopsy in 2016  . Osteoarthrosis, unspecified whether generalized or localized, ankle and foot   . DDD (degenerative disc disease), lumbar   . Glaucoma   . IBS (irritable bowel syndrome)   . Meningioma ([place])   . Moderate intellectual disabilities   . Transaminitis   . Obesity   . Chronic low back pain   . Allergic rhinitis   . Hx of fracture     R arm, foot  . Heart murmur     Past Surgical History  Procedure Laterality Date  . Total abdominal hysterectomy  03/05/1992    leiomyoma and cellular leiomyoma  . Cataract extraction    . Elbow surgery      right elbow  . Lipoma excision Left [id number]    posterior left axilla    Family History  Problem Relation Age of Onset  . Diabetes Mother   . Stroke Mother   . Hypertension Mother   . Diabetes Maternal Aunt   . Heart disease Maternal Aunt      (had pacemaker)  . Diabetes Maternal Grandmother   . Stroke Maternal Grandmother   . Hypertension Maternal Grandmother   . Stroke Father     Social History   Social History  . Marital Status: Single    Spouse Name: N/A  . Number of Children: 0  . Years of Education: N/A   Occupational History  . Disabled (from arm fracture)     Social History Main Topics  . Smoking status: Never Smoker   . Smokeless tobacco: Never Used  . Alcohol Use: No  . Drug Use: No  . Sexual Activity: Not Currently    Birth Control/ Protection: Surgical     Comment: Hyst--TAH--unsure if has ovaries   Other Topics Concern  . None   Social History Narrative   Lives at [facility] ([address] in [place])      Bertie for arm fracture      [name] (neighbor) is POA     Current outpatient prescriptions:  .  acetaminophen (TYLENOL) 500 MG tablet, Take 500 mg by mouth as needed (left ear pain)., Disp: , Rfl:  .  aspirin EC 81 MG tablet, Take 81 mg by mouth daily., Disp: , Rfl:  .  diltiazem (CARDIZEM CD) 240 MG 24 hr capsule, TAKE ONE CAPSULE BY MOUTH EVERY DAY (Patient taking differently: TAKE 240 MG BY MOUTH DAILY), Disp: 90 capsule, Rfl: 3 .  hydrochlorothiazide (MICROZIDE) 12.5 MG capsule, TAKE 1 CAPSULE EVERY DAY (Patient taking differently: TAKE 12.5 MG BY MOUTH DAILY), Disp: 90 capsule, Rfl: 0 .  ketoconazole (NIZORAL) 2 % shampoo, APPLY TO AFFECTED AREA TWICE A WEEK AS DIRECTED, Disp: 120 mL,  Rfl: 1 .  latanoprost (XALATAN) 0.005 % ophthalmic solution, Place 1 drop into both eyes at bedtime. , Disp: , Rfl:  .  loratadine (CLARITIN) 10 MG tablet, Take 10 mg by mouth daily., Disp: , Rfl:  .  metFORMIN (GLUCOPHAGE) 500 MG tablet, TAKE 1 TABLET BY MOUTH TWICE A DAY WITH A MEAL (Patient taking differently: TAKE 500 MG BY MOUTH TWICE DAILY WITH A MEAL), Disp: 180 tablet, Rfl: 0 .  nystatin (MYCOSTATIN/NYSTOP) 100000 UNIT/GM POWD, Take as directed, Disp: , Rfl:  .  omeprazole (PRILOSEC) 20 MG capsule, TAKE 1 CAPSULE (20 MG TOTAL) BY MOUTH 2 (TWO) TIMES DAILY., Disp: 180 capsule, Rfl: 3 .  potassium chloride SA (K-DUR,KLOR-CON) 20 MEQ tablet, Take 1 tablet (20 mEq total) by mouth daily., Disp: 90 tablet, Rfl: 3 .  pravastatin (PRAVACHOL) 40 MG tablet, TAKE 1 TABLET BY MOUTH EVERY EVENING  (Patient taking differently: TAKE 40 MG BY MOUTH DAILY), Disp: 90 tablet, Rfl: 2 .  triamcinolone cream (KENALOG) 0.1 %, Apply 1 application topically 2 (two) times daily as needed. (Patient taking differently: Apply 1 application topically 2 (two) times daily as needed (skin irritation). ), Disp: 30 g, Rfl: 0 .  [DISCONTINUED] Calcium Carbonate-Vitamin D (CALCIUM-VITAMIN D) 500-200 MG-UNIT per tablet, Take 1 tablet by mouth 2 (two) times daily with a meal.  , Disp: , Rfl:  .  [DISCONTINUED] diltiazem (TIAZAC) 240 MG 24 hr capsule, Take 1 capsule (240 mg total) by mouth daily., Disp: 90 capsule, Rfl: 0 .  [DISCONTINUED] fexofenadine (ALLEGRA) 180 MG tablet, Take 1 tablet (180 mg total) by mouth daily., Disp: 15 tablet, Rfl: 0  EXAM:  Filed Vitals:   10/30/14 1256  BP: 120/80  Pulse: 93  Temp: 97.5 F (36.4 C)    Body mass index is 36.94 kg/(m^2).  GENERAL: vitals reviewed and listed above, alert, oriented, appears well hydrated and in no acute distress  HEENT: atraumatic, conjunttiva clear, no obvious abnormalities on inspection of external nose and ears, ear canal and TM appear normal, poor dental hygeine but no sig swelling or redness in mouth  NECK: bilat possible parotid gland fullness and L ant cervical infraauricular LAD with TTP, no redness or edema  LUNGS: clear to auscultation bilaterally, no wheezes, rales or rhonchi, good air movement  CV: HRRR, no peripheral edema  MS: moves all extremities without noticeable abnormality  PSYCH: pleasant and cooperative, no obvious depression or anxiety  ASSESSMENT AND PLAN:  Discussed the following assessment and plan:  Left ear pain - Plan: Ambulatory referral to ENT  MENTAL RETARDATION, MODERATE  Sarcoidosis ([place])  Enlarged lymph nodes  -will have my assistant check on abx allergies with HCPOA as pt is unable to say what she can and can not take - would like to start amoxicillin -also will have her see ENT and will have  office staff schedule this given her intellectual challenges - addendum: scheduled to see ENT tomorrow and appointment information provided to patient. -Patient advised to return or notify a doctor immediately if symptoms worsen or persist or new concerns arise.  There are no Patient Instructions on file for this visit.   [name] R.

## 2014-10-30 NOTE — Telephone Encounter (Signed)
[facility] - Client [place] [facility] Patient Name: Linda Thompson Gender: Female DOB: [birth year] Age: 59 Y 2 M 26 D Return Phone Number: [phone] (Primary) Address: [address] City/State/Zip: [name] [address] Client [facility] Night - Client Client Site [facility] - Night Physician [name] Contact Type Call Call Type Triage / Clinical Relationship To Patient Self Return Phone Number [phone] (Primary) Chief Complaint Earache Initial Comment Caller states, saw the Dr on Fri for Lt ear pain, Lt gum swelling, was given 7 ds Rx for a mouth wash, She still has pain in ear PreDisposition Did not know what to do Nurse Assessment Nurse: Donne Anon, RN, Debra Date/Time (Eastern Time): 10/29/2014 9:11:25 AM  Confirm and document reason for call. If symptomatic, describe symptoms. ---Caller states, saw the Dr on Fri for Lt ear pain, Lt gum swelling, was given 7 days Rx for a mouth wash, She still has pain in ear as well. Was told to take tylenol 500 mg 3 per day for pain. Still have some pain and wants to know what else she needs to do.  Has the patient traveled out of the country within the last 30 days? ---Not Applicable Does the patient have any new or worsening symptoms? ---Yes Will a triage be completed? ---Yes Related visit to physician within the last 2 weeks? ---Yes Does the PT have any chronic conditions? (i.e. diabetes, asthma, etc.) ---Yes List chronic conditions. ---dm Guidelines Guideline Title Affirmed Question Affirmed Notes Nurse Date/Time Eilene Ghazi Time) Mouth Pain Diabetes mellitus or weak immune system (e.g., HIV positive, transplant patient) Donne Anon, RN, Debra 10/29/2014 9:15:19 AM Disp. Time Eilene Ghazi Time) Disposition Final User 10/29/2014 9:21:43 AM See Physician within 24 Hours Yes Donne Anon, Therapist, sports, Hilda Blades

## 2014-10-30 NOTE — Telephone Encounter (Signed)
[facility] - Client [place] [facility] Patient Name: Linda Thompson Gender: Female DOB: [birth year] Age: 59 Y 2 M 25 D Return Phone Number: [phone] (Primary) Address: [address] City/State/Zip: [name]  [address] Client [facility] Night - Client Client Site [facility] - Night Physician [name] Contact Type Call Call Type Triage / Clinical Relationship To Patient Self Return Phone Number [phone] (Primary) Chief Complaint Mouth Symptoms Initial Comment Caller states can she use heating pad on her face, mouth issues No Triage Reason Other Nurse Assessment Nurse: Rock Nephew, RN, Marliss Coots Date/Time (Eastern Time): 10/27/2014 8:23:49 PM Confirm and document reason for call. If symptomatic, describe symptoms. ---Caller states that she used a heating pad to her face and she is having mouth issues. She went to the dentist Tuesday and she mentioned to her about her ear pain. The dentist said something to her about her grinding her teeth. She had xrays done yesterday and was told it was her ear. She called the nurse last night and she was told to go to her doctor today so she went to the doctor today and had her ears looked at. Her left ear was fine and the doctor told her there was something on her gums inside her mouth. She has been taking tylenol for pain per her doctor's orders. The pharmacist also recommended a mouth cleanser. She wants to know if it is okay to use a heating pad on her face for the pain. Has the patient traveled out of the country within the last 30 days? ---Not Applicable Does the patient have any new or worsening symptoms? ---Yes Will a triage be completed? ---No Select reason for no triage. ---Other Please document clinical information provided and list any resource used. ---Advised caller that it would be safe to use a heating  pad intermittently. To use it for about 20 minutes then leave it off for 40 minutes and then use it intermittently for no more than 15-20 minutes at a time. Caller verbalized understanding.

## 2014-10-30 NOTE — Telephone Encounter (Signed)
Per Dr Maudie Mercury I left a message for Linda Thompson to call back as she wanted to know what type of reaction the pt had to Amoxicillin or Augmentin.  Mrs Flippin called back and stated she is not sure of the reaction to these medications as she only was told she cannot take penicillin type medications.  She also wanted to let Dr Maudie Mercury know in confidence that she feels the pt loves attention and does not deny she is in pain, and she almost drove Dr [name] crazy calling and/or going in for visits every day.  States the pt used to do this same thing when she lived alone and at that time she would call the ambulance to come to her home every other day almost and they thought moving her to be around other people would help but she feels she is starting this again.

## 2014-10-30 NOTE — Telephone Encounter (Signed)
Noted. She is seeing ENT tomorrow.

## 2014-10-30 NOTE — Progress Notes (Signed)
Pre visit review using our clinic review tool, if applicable. No additional management support is needed unless otherwise documented below in the visit note. 

## 2014-10-31 DIAGNOSIS — R590 Localized enlarged lymph nodes: Secondary | ICD-10-CM | POA: Diagnosis not present

## 2014-11-08 ENCOUNTER — Ambulatory Visit: Payer: Medicare Other | Admitting: Family Medicine

## 2014-11-08 DIAGNOSIS — E119 Type 2 diabetes mellitus without complications: Secondary | ICD-10-CM | POA: Diagnosis not present

## 2014-11-08 DIAGNOSIS — H11823 Conjunctivochalasis, bilateral: Secondary | ICD-10-CM | POA: Diagnosis not present

## 2014-11-08 DIAGNOSIS — Z961 Presence of intraocular lens: Secondary | ICD-10-CM | POA: Diagnosis not present

## 2014-11-08 DIAGNOSIS — H401131 Primary open-angle glaucoma, bilateral, mild stage: Secondary | ICD-10-CM | POA: Diagnosis not present

## 2014-11-08 DIAGNOSIS — H04123 Dry eye syndrome of bilateral lacrimal glands: Secondary | ICD-10-CM | POA: Diagnosis not present

## 2014-11-08 LAB — HM DIABETES EYE EXAM

## 2014-11-21 ENCOUNTER — Encounter: Payer: Self-pay | Admitting: Family Medicine

## 2014-11-22 ENCOUNTER — Encounter: Payer: Self-pay | Admitting: Podiatry

## 2014-11-22 ENCOUNTER — Ambulatory Visit (INDEPENDENT_AMBULATORY_CARE_PROVIDER_SITE_OTHER): Payer: Medicare Other | Admitting: Podiatry

## 2014-11-22 DIAGNOSIS — M79673 Pain in unspecified foot: Secondary | ICD-10-CM

## 2014-11-22 DIAGNOSIS — B351 Tinea unguium: Secondary | ICD-10-CM | POA: Diagnosis not present

## 2014-11-22 DIAGNOSIS — E1140 Type 2 diabetes mellitus with diabetic neuropathy, unspecified: Secondary | ICD-10-CM

## 2014-11-22 NOTE — Progress Notes (Signed)
Patient ID: Linda Thompson, female   DOB: 05/01/1955, 59 y.o.   MRN: [id number] Complaint:  Visit Type: Patient returns to my office for continued preventative foot care services. Complaint: Patient states" my nails have grown long and thick and become painful to walk and wear shoes" Patient has been diagnosed with DM with no foot complications. The patient presents for preventative foot care services. No changes to ROS  Podiatric Exam: Vascular: dorsalis pedis and posterior tibial pulses are palpable bilateral. Capillary return is immediate. Temperature gradient is WNL. Skin turgor WNL  Sensorium: Normal Semmes Weinstein monofilament test. Normal tactile sensation bilaterally. Nail Exam: Pt has thick disfigured discolored nails with subungual debris noted bilateral entire nail hallux through fifth toenails Ulcer Exam: There is no evidence of ulcer or pre-ulcerative changes or infection. Orthopedic Exam: Muscle tone and strength are WNL. No limitations in general ROM. No crepitus or effusions noted. Foot type and digits show no abnormalities. Bony prominences are unremarkable. Skin: No Porokeratosis. No infection or ulcers  Diagnosis:  Onychomycosis, , Pain in right toe, pain in left toes  Treatment & Plan Procedures and Treatment: Consent by patient was obtained for treatment procedures. The patient understood the discussion of treatment and procedures well. All questions were answered thoroughly reviewed. Debridement of mycotic and hypertrophic toenails, 1 through 5 bilateral and clearing of subungual debris. No ulceration, no infection noted.  Return Visit-Office Procedure: Patient instructed to return to the office for a follow up visit 3 months for continued evaluation and treatment. 

## 2014-11-24 ENCOUNTER — Ambulatory Visit: Payer: Medicare Other | Admitting: Podiatry

## 2014-12-01 ENCOUNTER — Other Ambulatory Visit: Payer: Self-pay | Admitting: Family

## 2014-12-16 ENCOUNTER — Telehealth: Payer: Self-pay | Admitting: Gynecology

## 2014-12-16 NOTE — Telephone Encounter (Signed)
On Call Note 02:40 AM :  Complaints of vaginal irritation. No discharge, odor, vulvar lesions on self exam, UTI symptoms. Tried Aveeno without relief.  Recommended OTC antifungal i.e. Monistat.  Call office for appointment if persists.

## 2014-12-18 ENCOUNTER — Telehealth: Payer: Self-pay | Admitting: Certified Nurse Midwife

## 2014-12-18 ENCOUNTER — Telehealth: Payer: Self-pay | Admitting: *Deleted

## 2014-12-18 NOTE — Telephone Encounter (Signed)
Returned call

## 2014-12-18 NOTE — Telephone Encounter (Signed)
Message left to return call to [place] at [phone].   Patient called on call provider:   Anastasio Auerbach, MD at 12/16/2014 8:17 AM     Status: Signed       Expand All Collapse All   On Call Note 02:40 AM : Complaints of vaginal irritation. No discharge, odor, vulvar lesions on self exam, UTI symptoms. Tried Aveeno without relief. Recommended OTC antifungal i.e. Monistat. Call office for appointment if persists.

## 2014-12-18 NOTE — Telephone Encounter (Signed)
Call to patient. No answer. Phone rang and rang.   If patient returns call, please transfer to any available nurse for triage.

## 2014-12-18 NOTE — Telephone Encounter (Signed)
Patient calling requesting to speak with the nurse. She said, "I have some questions about a possible yeast infection but I want to speak with the nurse before I schedule an appointment please."

## 2014-12-18 NOTE — Telephone Encounter (Signed)
Spoke with patient. Patient states that she called and spoke with a doctor on Saturday regarding vaginal itching. Has been using Aveeno Eczema cream externally along with Nystatin powder. States she was advised to use a warm cloth to the area for relief. Per not below was also recommended to use OTC antifungal cream. Patient states that she did not use OTC antifungal. States her symptoms have gone away. Would like to know if she needs to be seen for an appointment. Denies any vaginal itching, irritation, or discharge. Advised patient okay to monitor symptoms. If symptoms return she will need to be seen in office for further evaluation. Patient is agreeable.  Routing to provider for final review. Patient agreeable to disposition. Will close encounter.

## 2014-12-18 NOTE — Telephone Encounter (Signed)
Per notes on 11/26 and 11/28, patient has been communicating with her [place] office and they have made a plan of care to manage symptoms.  ---------------------------------------------------------------------------------------------------------------------------------------------------------------------------------------------------------------------------- PLEASE NOTE: All timestamps contained within this report are represented as [place] Standard Time. CONFIDENTIALTY NOTICE: This fax transmission is intended only for the addressee. It contains information that is legally privileged, confidential or otherwise protected from use or disclosure. If you are not the intended recipient, you are strictly prohibited from reviewing, disclosing, copying using or disseminating any of this information or taking any action in reliance on or regarding this information. If you have received this fax in error, please notify us immediately by telephone so that we can arrange for its return to [place]. Phone: [phone], Toll-Free: [phone], Fax: [phone] Page: 1 of 2 Call Id: [id number] [facility] - Client [place] Patient Name: Linda Thompson Gender: Female DOB: [birth year] Age: 59 Y 82 M 13 D Return Phone Number: [phone] (Primary) Address: [address] City/State/Zip: [name] [address] Client [facility] Night - Client Client Site [facility] - Night Physician [name] Contact Type Call Call Type Triage / Clinical Relationship To Patient Self Return Phone Number [phone] (Primary) Chief Complaint Itching Initial Comment Caller states she is being treated for a yeast infection. She has been using Aveno on the outside. Now she is itching on the inside. PreDisposition Call Doctor Nurse Assessment Nurse: Koleen Nimrod, RN, Normand Sloop Date/Time Eilene Ghazi Time): 12/16/2014 3:11:46  AM Confirm and document reason for call. If symptomatic, describe symptoms. ---pt has itching on labia. dr told her to put aveeno on it. its still itching. Has the patient traveled out of the country within the last 30 days? ---No Does the patient have any new or worsening symptoms? ---Yes Will a triage be completed? ---Yes Related visit to physician within the last 2 weeks? ---No Does the PT have any chronic conditions? (i.e. diabetes, asthma, etc.) ---Yes List chronic conditions. ---hypertension, diabetes, hernia, Is this a behavioral health call? ---No Guidelines Guideline Title Affirmed Question Affirmed Notes Nurse Date/Time (Eastern Time) Vulvar Symptoms [1] Vulvar itching AND [2] not improved > 3 days following CARE ADVICE Koleen Nimrod, RN, Normand Sloop 12/16/2014 3:18:21 AM Disp. Time Eilene Ghazi Time) Disposition Final User 12/16/2014 3:01:14 AM Send To Call Back Waiting For Nurse Zannie Kehr 12/16/2014 3:21:08 AM See PCP When Office is Open (within 3 days) Yes Koleen Nimrod, RN, Normand Sloop PLEASE NOTE: All timestamps contained within this report are represented as [place] Standard Time. CONFIDENTIALTY NOTICE: This fax transmission is intended only for the addressee. It contains information that is legally privileged, confidential or otherwise protected from use or disclosure. If you are not the intended recipient, you are strictly prohibited from reviewing, disclosing, copying using or disseminating any of this information or taking any action in reliance on or regarding this information. If you have received this fax in error, please notify us immediately by telephone so that we can arrange for its return to [place]. Phone: [phone], Toll-Free: [phone], Fax: [phone] Page: 2 of 2 Call Id: [id number] [place] Understands: Yes Disagree/Comply: Comply Care Advice Given Per Guideline SEE PCP WITHIN 3 DAYS: * You need to be seen within 2 or 3 days. Call your doctor during  regular office hours and make an appointment. An urgent care center is often the best source of care if your doctor's office is closed or you can't get an appointment. NOTE: If office will be open tomorrow, tell caller  to call then, not in 3 days. GENITAL HYGIENE: * Keep your genital area clean. Wash daily with water. * Keep your genital area dry. Wear cotton underwear or underwear with a cotton crotch. * Do not douche. * Do not use feminine hygiene products, perfumed soaps. CALL BACK IF: * Rash or severe itching * Yellow or green vaginal discharge * Foul smelling vaginal discharge * You become worse. CARE ADVICE given per Vulvar Symptoms (Adult) guideline. After Care Instructions Given Call Event Type User Date / Time Description

## 2014-12-20 ENCOUNTER — Ambulatory Visit (INDEPENDENT_AMBULATORY_CARE_PROVIDER_SITE_OTHER): Payer: Medicare Other | Admitting: Adult Health

## 2014-12-20 ENCOUNTER — Encounter: Payer: Self-pay | Admitting: Adult Health

## 2014-12-20 VITALS — BP 130/80 | Temp 98.1°F | Ht 62.0 in | Wt 199.6 lb

## 2014-12-20 DIAGNOSIS — R300 Dysuria: Secondary | ICD-10-CM | POA: Diagnosis not present

## 2014-12-20 DIAGNOSIS — R197 Diarrhea, unspecified: Secondary | ICD-10-CM

## 2014-12-20 DIAGNOSIS — H6981 Other specified disorders of Eustachian tube, right ear: Secondary | ICD-10-CM

## 2014-12-20 DIAGNOSIS — H6991 Unspecified Eustachian tube disorder, right ear: Secondary | ICD-10-CM

## 2014-12-20 LAB — POCT URINALYSIS DIPSTICK
Bilirubin, UA: NEGATIVE
Blood, UA: NEGATIVE
Glucose, UA: NEGATIVE
KETONES UA: NEGATIVE
LEUKOCYTES UA: NEGATIVE
NITRITE UA: NEGATIVE
PH UA: 7
PROTEIN UA: NEGATIVE
Spec Grav, UA: 1.015
UROBILINOGEN UA: 0.2

## 2014-12-20 NOTE — Progress Notes (Signed)
Pre visit review using our clinic review tool, if applicable. No additional management support is needed unless otherwise documented below in the visit note. 

## 2014-12-20 NOTE — Progress Notes (Signed)
Subjective:    Patient ID: Linda Thompson, female    DOB: [birth year], 59 y.o.   MRN: [id number]  HPI  59 year old female, patient of Dr. Maudie Mercury, who I am seeing for acute issues of " ear popping" and diarrhea.   1) She first noticed last night that her right ear was " popping" when she swallowed food. She denies any drainage, pain, ringing, or fevers.   2) Diarrhea for less than 48 hours. She feels as though she constantly has to have a bowel movement and when she does it is diarrhea. She denies any blood in her stool. Does not have any abdominal pain, nausea or vomiting. Does have history of IBS. Started Imodium this morning.   Review of Systems  Constitutional: Negative.   HENT: Negative for congestion, ear discharge, ear pain ("popping"), postnasal drip, rhinorrhea, sinus pressure, sore throat and trouble swallowing.   Respiratory: Negative.   Cardiovascular: Negative.   Gastrointestinal: Positive for diarrhea. Negative for nausea, vomiting, abdominal pain, constipation, blood in stool, abdominal distention and rectal pain.  Neurological: Negative.   All other systems reviewed and are negative.  Past Medical History  Diagnosis Date  . Type II or unspecified type diabetes mellitus without mention of complication, not stated as uncontrolled   . Hypertension   . Hyperlipemia   . GERD (gastroesophageal reflux disease)     w/ hx diaphragmatic hernia  . Sarcoidosis ([place])     difuse bony lesions and LDA, dx by biopsy in 2016  . Osteoarthrosis, unspecified whether generalized or localized, ankle and foot   . DDD (degenerative disc disease), lumbar   . Glaucoma   . IBS (irritable bowel syndrome)   . Meningioma ([place])   . Moderate intellectual disabilities   . Transaminitis   . Obesity   . Chronic low back pain   . Allergic rhinitis   . Hx of fracture     R arm, foot  . Heart murmur     Social History   Social History  . Marital Status: Single    Spouse Name: N/A  . Number  of Children: 0  . Years of Education: N/A   Occupational History  . Disabled (from arm fracture)    Social History Main Topics  . Smoking status: Never Smoker   . Smokeless tobacco: Never Used  . Alcohol Use: No  . Drug Use: No  . Sexual Activity: Not Currently    Birth Control/ Protection: Surgical     Comment: Hyst--TAH--unsure if has ovaries   Other Topics Concern  . Not on file   Social History Narrative   Lives at [facility] (on [address] in [place])      [place] for arm fracture      [name] (neighbor) is POA    Past Surgical History  Procedure Laterality Date  . Total abdominal hysterectomy  03/05/1992    leiomyoma and cellular leiomyoma  . Cataract extraction    . Elbow surgery      right elbow  . Lipoma excision Left [id number]    posterior left axilla    Family History  Problem Relation Age of Onset  . Diabetes Mother   . Stroke Mother   . Hypertension Mother   . Diabetes Maternal Aunt   . Heart disease Maternal Aunt      (had pacemaker)  . Diabetes Maternal Grandmother   . Stroke Maternal Grandmother   . Hypertension Maternal Grandmother   .  Stroke Father     Allergies  Allergen Reactions  . Ace Inhibitors Cough  . Amoxicillin-Pot Clavulanate Diarrhea  . Augmentin [Amoxicillin-Pot Clavulanate] Other (See Comments)    diarrhea  . Azithromycin Diarrhea  . Ceftin [Cefuroxime Axetil]     diarrhea  . Cefuroxime Diarrhea and Other (See Comments)    Generalized weakness  . Cyclobenzaprine Hcl Diarrhea  . Flexeril [Cyclobenzaprine] Diarrhea  . Flonase [Fluticasone Propionate] Other (See Comments)    Nose bleed  . Ibuprofen Other (See Comments)    REACTION: vomiting    Current Outpatient Prescriptions on File Prior to Visit  Medication Sig Dispense Refill  . acetaminophen (TYLENOL) 500 MG tablet Take 500 mg by mouth as needed (left ear pain).    Marland Kitchen aspirin EC 81 MG tablet Take 81 mg by mouth daily.     Marland Kitchen diltiazem (CARDIZEM CD) 240 MG 24 hr capsule TAKE ONE CAPSULE BY MOUTH EVERY DAY (Patient taking differently: TAKE 240 MG BY MOUTH DAILY) 90 capsule 3  . hydrochlorothiazide (MICROZIDE) 12.5 MG capsule TAKE 1 CAPSULE EVERY DAY (Patient taking differently: TAKE 12.5 MG BY MOUTH DAILY) 90 capsule 0  . ketoconazole (NIZORAL) 2 % shampoo APPLY TO AFFECTED AREA TWICE A WEEK AS DIRECTED 120 mL 1  . KLOR-CON M20 20 MEQ tablet TAKE 1 TABLET BY MOUTH EVERY DAY 90 tablet 3  . latanoprost (XALATAN) 0.005 % ophthalmic solution Place 1 drop into both eyes at bedtime.     Marland Kitchen loratadine (CLARITIN) 10 MG tablet Take 10 mg by mouth daily.    . metFORMIN (GLUCOPHAGE) 500 MG tablet TAKE 1 TABLET BY MOUTH TWICE A DAY WITH A MEAL (Patient taking differently: TAKE 500 MG BY MOUTH TWICE DAILY WITH A MEAL) 180 tablet 0  . nystatin (MYCOSTATIN/NYSTOP) 100000 UNIT/GM POWD Take as directed    . omeprazole (PRILOSEC) 20 MG capsule TAKE 1 CAPSULE (20 MG TOTAL) BY MOUTH 2 (TWO) TIMES DAILY. 180 capsule 3  . pravastatin (PRAVACHOL) 40 MG tablet TAKE 1 TABLET BY MOUTH EVERY EVENING (Patient taking differently: TAKE 40 MG BY MOUTH DAILY) 90 tablet 2  . triamcinolone cream (KENALOG) 0.1 % Apply 1 application topically 2 (two) times daily as needed. (Patient taking differently: Apply 1 application topically 2 (two) times daily as needed (skin irritation). ) 30 g 0  . [DISCONTINUED] Calcium Carbonate-Vitamin D (CALCIUM-VITAMIN D) 500-200 MG-UNIT per tablet Take 1 tablet by mouth 2 (two) times daily with a meal.      . [DISCONTINUED] diltiazem (TIAZAC) 240 MG 24 hr capsule Take 1 capsule (240 mg total) by mouth daily. 90 capsule 0  . [DISCONTINUED] fexofenadine (ALLEGRA) 180 MG tablet Take 1 tablet (180 mg total) by mouth daily. 15 tablet 0   No current facility-administered medications on file prior to visit.    BP 130/80 mmHg  Temp(Src) 98.1 F (36.7 C) (Oral)  Ht 5\' 2"  (1.575 m)  Wt 199 lb 9.6 oz (90.538 kg)  BMI 36.50  kg/m2  LMP 01/21/1992 (Approximate)       Objective:   Physical Exam  Constitutional: She is oriented to person, place, and time. She appears well-developed and well-nourished. No distress.  HENT:  Head: Normocephalic and atraumatic.  Right Ear: External ear normal.  Left Ear: External ear normal.  Nose: Nose normal.  Mouth/Throat: Oropharynx is clear and moist. No oropharyngeal exudate.  TM's visualized in both ears. No cerumen impaction, no signs of infection  Eyes: Conjunctivae and EOM are normal. Pupils are equal, round, and  reactive to light. Right eye exhibits no discharge. Left eye exhibits no discharge. No scleral icterus.  Neck: Normal range of motion. Neck supple.  Cardiovascular: Normal rate, regular rhythm, normal heart sounds and intact distal pulses.  Exam reveals no gallop and no friction rub.   No murmur heard. Pulmonary/Chest: Effort normal and breath sounds normal. No respiratory distress. She has no wheezes. She has no rales. She exhibits no tenderness.  Abdominal: Soft. Bowel sounds are normal. She exhibits no distension and no mass. There is no tenderness. There is no rebound and no guarding.  Lymphadenopathy:    She has no cervical adenopathy.  Neurological: She is alert and oriented to person, place, and time.  Skin: Skin is warm and dry. No rash noted. She is not diaphoretic. No erythema. No pallor.  Psychiatric: She has a normal mood and affect. Her behavior is normal. Thought content normal.  Nursing note and vitals reviewed.      Assessment & Plan:  1. Eustachian tube dysfunction, right - Likely viral  - Allergy nasal spray or decongestant.  - Follow up if no improvement in 2-3 days  2. Diarrhea, unspecified type - IBS vs Gastroenteritis. - Can take Imodium tonight  - Pepto  - BRAT diet for next 3 days.  - stay hydrated - Follow up if no improvement or if symptoms worsen

## 2014-12-20 NOTE — Patient Instructions (Addendum)
It was great meeting you today!  Start using your nose spray and allergy medication again  - this will help with the ear popping.   Give your stomach a rest and eat foods such as chicken soup, bananas, toast for the next 3 days.   You can use pepto bismol to help with the diarrhea.     Food Choices to Help Relieve Diarrhea, Adult When you have diarrhea, the foods you eat and your eating habits are very important. Choosing the right foods and drinks can help relieve diarrhea. Also, because diarrhea can last up to 7 days, you need to replace lost fluids and electrolytes (such as sodium, potassium, and chloride) in order to help prevent dehydration.  WHAT GENERAL GUIDELINES DO I NEED TO FOLLOW?  Slowly drink 1 cup (8 oz) of fluid for each episode of diarrhea. If you are getting enough fluid, your urine will be clear or pale yellow.  Eat starchy foods. Some good choices include white rice, white toast, pasta, low-fiber cereal, baked potatoes (without the skin), saltine crackers, and bagels.  Avoid large servings of any cooked vegetables.  Limit fruit to two servings per day. A serving is  cup or 1 small piece.  Choose foods with less than 2 g of fiber per serving.  Limit fats to less than 8 tsp (38 g) per day.  Avoid fried foods.  Eat foods that have probiotics in them. Probiotics can be found in certain dairy products.  Avoid foods and beverages that may increase the speed at which food moves through the stomach and intestines (gastrointestinal tract). Things to avoid include:  High-fiber foods, such as dried fruit, raw fruits and vegetables, nuts, seeds, and whole grain foods.  Spicy foods and high-fat foods.  Foods and beverages sweetened with high-fructose corn syrup, honey, or sugar alcohols such as xylitol, sorbitol, and mannitol. WHAT FOODS ARE RECOMMENDED? Grains White rice. White, [place], or pita breads (fresh or toasted), including plain rolls, buns, or bagels. White  pasta. Saltine, soda, or graham crackers. Pretzels. Low-fiber cereal. Cooked cereals made with water (such as cornmeal, farina, or cream cereals). Plain muffins. Matzo. Melba toast. Zwieback.  Vegetables Potatoes (without the skin). Strained tomato and vegetable juices. Most well-cooked and canned vegetables without seeds. Tender lettuce. Fruits Cooked or canned applesauce, apricots, cherries, fruit cocktail, grapefruit, peaches, pears, or plums. Fresh bananas, apples without skin, cherries, grapes, cantaloupe, grapefruit, peaches, oranges, or plums.  Meat and Other Protein Products Baked or boiled chicken. Eggs. Tofu. Fish. Seafood. Smooth peanut butter. Ground or well-cooked tender beef, ham, veal, lamb, pork, or poultry.  Dairy Plain yogurt, kefir, and unsweetened liquid yogurt. Lactose-free milk, buttermilk, or soy milk. Plain hard cheese. Beverages Sport drinks. Clear broths. Diluted fruit juices (except prune). Regular, caffeine-free sodas such as ginger ale. Water. Decaffeinated teas. Oral rehydration solutions. Sugar-free beverages not sweetened with sugar alcohols. Other Bouillon, broth, or soups made from recommended foods.  The items listed above may not be a complete list of recommended foods or beverages. Contact your dietitian for more options. WHAT FOODS ARE NOT RECOMMENDED? Grains Whole grain, whole wheat, bran, or rye breads, rolls, pastas, crackers, and cereals. Wild or brown rice. Cereals that contain more than 2 g of fiber per serving. Corn tortillas or taco shells. Cooked or dry oatmeal. Granola. Popcorn. Vegetables Raw vegetables. Cabbage, broccoli, Brussels sprouts, artichokes, baked beans, beet greens, corn, kale, legumes, peas, sweet potatoes, and yams. Potato skins. Cooked spinach and cabbage. Fruits Dried fruit, including raisins  and dates. Raw fruits. Stewed or dried prunes. Fresh apples with skin, apricots, mangoes, pears, raspberries, and strawberries.  Meat and  Other Protein Products Chunky peanut butter. Nuts and seeds. Beans and lentils. Berniece Salines.  Dairy High-fat cheeses. Milk, chocolate milk, and beverages made with milk, such as milk shakes. Cream. Ice cream. Sweets and Desserts Sweet rolls, doughnuts, and sweet breads. Pancakes and waffles. Fats and Oils Butter. Cream sauces. Margarine. Salad oils. Plain salad dressings. Olives. Avocados.  Beverages Caffeinated beverages (such as coffee, tea, soda, or energy drinks). Alcoholic beverages. Fruit juices with pulp. Prune juice. Soft drinks sweetened with high-fructose corn syrup or sugar alcohols. Other Coconut. Hot sauce. Chili powder. Mayonnaise. Gravy. Cream-based or milk-based soups.  The items listed above may not be a complete list of foods and beverages to avoid. Contact your dietitian for more information. WHAT SHOULD I DO IF I BECOME DEHYDRATED? Diarrhea can sometimes lead to dehydration. Signs of dehydration include dark urine and dry mouth and skin. If you think you are dehydrated, you should rehydrate with an oral rehydration solution. These solutions can be purchased at pharmacies, retail stores, or online.  Drink -1 cup (120-240 mL) of oral rehydration solution each time you have an episode of diarrhea. If drinking this amount makes your diarrhea worse, try drinking smaller amounts more often. For example, drink 1-3 tsp (5-15 mL) every 5-10 minutes.  A general rule for staying hydrated is to drink 1-2 L of fluid per day. Talk to your health care provider about the specific amount you should be drinking each day. Drink enough fluids to keep your urine clear or pale yellow.   This information is not intended to replace advice given to you by your health care provider. Make sure you discuss any questions you have with your health care provider.   Document Released: 03/29/2003 Document Revised: 01/27/2014 Document Reviewed: 11/29/2012 Elsevier Interactive Patient Education [employer].

## 2014-12-20 NOTE — Addendum Note (Signed)
Addended by: Colleen Can on: 12/20/2014 01:46 PM   Modules accepted: Orders

## 2014-12-25 ENCOUNTER — Telehealth: Payer: Self-pay | Admitting: Certified Nurse Midwife

## 2014-12-25 NOTE — Telephone Encounter (Signed)
Spoke with patient. Patient states that she was seen last week on 11/30 for pain in her ear and a "stomach virus." States when she was seen she was also experiencing some vaginal itching and irritation externally. Was advised by physician to use her Aveeno eczema cream externally for symptoms and to return call if she still had symptoms on 12/5. Patient states that she does not have any vaginal itching or irritation today. "I feel a lot better. I think it was from having to use the bathroom so much from that virus." Advised if she is not having any symptoms currently it is okay to monitor. If her symptoms return she will need to be seen for further evaluation. Patient is agreeable.  Routing to provider for final review. Patient agreeable to disposition. Will close encounter.

## 2014-12-25 NOTE — Telephone Encounter (Signed)
Patient is asking to talk with Regina Eck nurse. No further details given. Lasts seen 07/27/2014.

## 2014-12-26 ENCOUNTER — Telehealth: Payer: Self-pay | Admitting: Obstetrics and Gynecology

## 2014-12-26 ENCOUNTER — Encounter: Payer: Self-pay | Admitting: Family Medicine

## 2014-12-26 ENCOUNTER — Ambulatory Visit (INDEPENDENT_AMBULATORY_CARE_PROVIDER_SITE_OTHER): Payer: Medicare Other | Admitting: Family Medicine

## 2014-12-26 VITALS — BP 124/82 | HR 109 | Temp 97.3°F | Ht 62.0 in | Wt 197.5 lb

## 2014-12-26 DIAGNOSIS — R195 Other fecal abnormalities: Secondary | ICD-10-CM

## 2014-12-26 NOTE — Progress Notes (Signed)
Pre visit review using our clinic review tool, if applicable. No additional management support is needed unless otherwise documented below in the visit note. 

## 2014-12-26 NOTE — Telephone Encounter (Signed)
Patient calling requesting to speak with the nurse. She said, "I am having some more itching and I'd like to speak to the nurse to see what she has to say. She told me to call if I had any questions." Patient declined an appointment until speaking with the nurse.

## 2014-12-26 NOTE — Patient Instructions (Signed)
Ok to use imodium per instructions if needed.  Avoid dairy for 1 week  Follow up if diarrhea or other concerns persist in 2 weeks or if worsening.

## 2014-12-26 NOTE — Telephone Encounter (Signed)
Spoke with patient. Patient states that since yesterday when we spoke her vaginal itching has returned. Please see telephone note date 12/25/2014. Has been using Aveeno Eczema cream externally with intermittent relief. Advised she will need to be seen in the office for further evaluation with Linda Thompson CNM. Patient is agreeable. Requesting an appointment for Friday 12/29/2014. Appointment scheduled for 12/29/2014 at 11 am. Patient is agreeable to date and time. Aware she will need to be seen earlier for evaluation if symptoms worsen or she develops any new symptoms.  Routing to provider for final review. Patient agreeable to disposition. Will close encounter.

## 2014-12-26 NOTE — Progress Notes (Signed)
HPI:  Linda Thompson is a pleasant 71 patient with PMH sig for moderate intellectual disabilities and IBS here for an acute visit for diarrhea. Reports had a few episodes of nonbloody diarrhea last week and saw another provider. Used imodium once and ate bland diet and resolved. Ate cheese and had a loose (not watery, bloody stool) yesterday once - took imodium. Denies fevers, malaise, melena, hematochezia, nausea, vomiting, abd pain, dysuria, inability to tolerate PO intake.  ROS: See pertinent positives and negatives per HPI.  Past Medical History  Diagnosis Date  . Type II or unspecified type diabetes mellitus without mention of complication, not stated as uncontrolled   . Hypertension   . Hyperlipemia   . GERD (gastroesophageal reflux disease)     w/ hx diaphragmatic hernia  . Sarcoidosis ([place])     difuse bony lesions and LDA, dx by biopsy in 2016  . Osteoarthrosis, unspecified whether generalized or localized, ankle and foot   . DDD (degenerative disc disease), lumbar   . Glaucoma   . IBS (irritable bowel syndrome)   . Meningioma ([place])   . Moderate intellectual disabilities   . Transaminitis   . Obesity   . Chronic low back pain   . Allergic rhinitis   . Hx of fracture     R arm, foot  . Heart murmur     Past Surgical History  Procedure Laterality Date  . Total abdominal hysterectomy  03/05/1992    leiomyoma and cellular leiomyoma  . Cataract extraction    . Elbow surgery      right elbow  . Lipoma excision Left [id number]    posterior left axilla    Family History  Problem Relation Age of Onset  . Diabetes Mother   . Stroke Mother   . Hypertension Mother   . Diabetes Maternal Aunt   . Heart disease Maternal Aunt      (had pacemaker)  . Diabetes Maternal Grandmother   . Stroke Maternal Grandmother   . Hypertension Maternal Grandmother   . Stroke Father     Social History   Social History  . Marital Status: Single    Spouse Name: N/A  . Number of  Children: 0  . Years of Education: N/A   Occupational History  . Disabled (from arm fracture)    Social History Main Topics  . Smoking status: Never Smoker   . Smokeless tobacco: Never Used  . Alcohol Use: No  . Drug Use: No  . Sexual Activity: Not Currently    Birth Control/ Protection: Surgical     Comment: Hyst--TAH--unsure if has ovaries   Other Topics Concern  . None   Social History Narrative   Lives at [facility] (on [address] in [place])      [place] for arm fracture      [name] (neighbor) is POA     Current outpatient prescriptions:  .  acetaminophen (TYLENOL) 500 MG tablet, Take 500 mg by mouth as needed (left ear pain)., Disp: , Rfl:  .  aspirin EC 81 MG tablet, Take 81 mg by mouth daily., Disp: , Rfl:  .  diltiazem (CARDIZEM CD) 240 MG 24 hr capsule, TAKE ONE CAPSULE BY MOUTH EVERY DAY (Patient taking differently: TAKE 240 MG BY MOUTH DAILY), Disp: 90 capsule, Rfl: 3 .  hydrochlorothiazide (MICROZIDE) 12.5 MG capsule, TAKE 1 CAPSULE EVERY DAY (Patient taking differently: TAKE 12.5 MG BY MOUTH DAILY), Disp: 90 capsule, Rfl: 0 .  ketoconazole (NIZORAL)  2 % shampoo, APPLY TO AFFECTED AREA TWICE A WEEK AS DIRECTED, Disp: 120 mL, Rfl: 1 .  KLOR-CON M20 20 MEQ tablet, TAKE 1 TABLET BY MOUTH EVERY DAY, Disp: 90 tablet, Rfl: 3 .  latanoprost (XALATAN) 0.005 % ophthalmic solution, Place 1 drop into both eyes at bedtime. , Disp: , Rfl:  .  loratadine (CLARITIN) 10 MG tablet, Take 10 mg by mouth daily., Disp: , Rfl:  .  metFORMIN (GLUCOPHAGE) 500 MG tablet, TAKE 1 TABLET BY MOUTH TWICE A DAY WITH A MEAL (Patient taking differently: TAKE 500 MG BY MOUTH TWICE DAILY WITH A MEAL), Disp: 180 tablet, Rfl: 0 .  nystatin (MYCOSTATIN/NYSTOP) 100000 UNIT/GM POWD, Take as directed, Disp: , Rfl:  .  omeprazole (PRILOSEC) 20 MG capsule, TAKE 1 CAPSULE (20 MG TOTAL) BY MOUTH 2 (TWO) TIMES DAILY., Disp: 180 capsule, Rfl: 3 .  pravastatin  (PRAVACHOL) 40 MG tablet, TAKE 1 TABLET BY MOUTH EVERY EVENING (Patient taking differently: TAKE 40 MG BY MOUTH DAILY), Disp: 90 tablet, Rfl: 2 .  triamcinolone cream (KENALOG) 0.1 %, Apply 1 application topically 2 (two) times daily as needed. (Patient taking differently: Apply 1 application topically 2 (two) times daily as needed (skin irritation). ), Disp: 30 g, Rfl: 0 .  [DISCONTINUED] Calcium Carbonate-Vitamin D (CALCIUM-VITAMIN D) 500-200 MG-UNIT per tablet, Take 1 tablet by mouth 2 (two) times daily with a meal.  , Disp: , Rfl:  .  [DISCONTINUED] diltiazem (TIAZAC) 240 MG 24 hr capsule, Take 1 capsule (240 mg total) by mouth daily., Disp: 90 capsule, Rfl: 0 .  [DISCONTINUED] fexofenadine (ALLEGRA) 180 MG tablet, Take 1 tablet (180 mg total) by mouth daily., Disp: 15 tablet, Rfl: 0  EXAM:  Filed Vitals:   12/26/14 1101  BP: 124/82  Pulse: 109  Temp: 97.3 F (36.3 C)    Body mass index is 36.11 kg/(m^2).  GENERAL: vitals reviewed and listed above, alert, oriented, appears well hydrated and in no acute distress  HEENT: atraumatic, conjunttiva clear, no obvious abnormalities on inspection of external nose and ears  NECK: no obvious masses on inspection  LUNGS: clear to auscultation bilaterally, no wheezes, rales or rhonchi, good air movement  CV: HRRR, no peripheral edema  ABD: BS+, soft, NTTP  MS: moves all extremities without noticeable abnormality  PSYCH: pleasant and cooperative, no obvious depression or anxiety  ASSESSMENT AND PLAN:  Discussed the following assessment and plan:  Loose stools  -Patient advised to return or notify a doctor immediately if symptoms worsen or persist or new concerns arise.  Patient Instructions  Ok to use imodium per instructions if needed.  Avoid dairy for 1 week  Follow up if diarrhea or other concerns persist in 2 weeks or if worsening.     [name] R.

## 2014-12-27 ENCOUNTER — Ambulatory Visit (INDEPENDENT_AMBULATORY_CARE_PROVIDER_SITE_OTHER): Payer: Medicare Other | Admitting: Certified Nurse Midwife

## 2014-12-27 ENCOUNTER — Telehealth: Payer: Self-pay | Admitting: *Deleted

## 2014-12-27 ENCOUNTER — Encounter: Payer: Self-pay | Admitting: Certified Nurse Midwife

## 2014-12-27 VITALS — BP 118/80 | HR 92 | Temp 97.8°F | Ht 62.0 in | Wt 197.0 lb

## 2014-12-27 DIAGNOSIS — N760 Acute vaginitis: Secondary | ICD-10-CM

## 2014-12-27 LAB — WET PREP BY MOLECULAR PROBE
CANDIDA SPECIES: NEGATIVE
Gardnerella vaginalis: NEGATIVE
TRICHOMONAS VAG: NEGATIVE

## 2014-12-27 NOTE — Patient Instructions (Signed)
Atrophic Vaginitis Atrophic vaginitis is when the tissues that line the vagina become dry and thin. This is caused by a drop in estrogen. Estrogen helps:  To keep the vagina moist.  To make a clear fluid that helps:  To lubricate the vagina for sex.  To protect the vagina from infection. If the lining of the vagina is dry and thin, it may:  Make sex painful. It may also cause bleeding.  Cause a feeling of:  Burning.  Irritation.  Itchiness.  Make an exam of your vagina painful. It may also cause bleeding.  Make you lose interest in sex.  Cause a burning feeling when you pee.  Make your vaginal fluid (discharge) brown or yellow. For some women, there are no symptoms. This condition is most common in women who do not get their regular menstrual periods anymore (menopause). This often starts when a woman is 45-55 years old. HOME CARE  Take medicines only as told by your doctor. Do not use any herbal or alternative medicines unless your doctor says it is okay.  Use over-the-counter products for dryness only as told by your doctor. These include:  Creams.  Lubricants.  Moisturizers.  Do not douche.  Do not use products that can make your vagina dry. These include:  Scented feminine sprays.  Scented tampons.  Scented soaps.  If it hurts to have sex, tell your sexual partner. GET HELP IF:  Your discharge looks different than normal.  Your vagina has an unusual smell.  You have new symptoms.  Your symptoms do not get better with treatment.  Your symptoms get worse.   This information is not intended to replace advice given to you by your health care provider. Make sure you discuss any questions you have with your health care provider.   Document Released: 06/25/2007 Document Revised: 05/23/2014 Document Reviewed: 12/28/2013 Elsevier Interactive Patient Education 2016 Else[employer] Inc[employer]  

## 2014-12-27 NOTE — Telephone Encounter (Signed)
PLEASE NOTE: All timestamps contained within this report are represented as [place] Standard Time. CONFIDENTIALTY NOTICE: This fax transmission is intended only for the addressee. It contains information that is legally privileged, confidential or otherwise protected from use or disclosure. If you are not the intended recipient, you are strictly prohibited from reviewing, disclosing, copying using or disseminating any of this information or taking any action in reliance on or regarding this information. If you have received this fax in error, please notify us immediately by telephone so that we can arrange for its return to [place]. Phone: [phone], Toll-Free: [phone], Fax: [phone] Page: 1 of 1 Call Id: [id number] [facility] Night - Client [place] Patient Name: Linda Thompson Gender: Female DOB: 30-Mar-1955 Age: 59 Y 4 M 24 D Return Phone Number: [phone] (Primary) Address: [address] City/State/Zip: [name] [address] Client [facility] Night - Client Client Site [facility] - Night Physician [name] Contact Type Call Call Type Triage / Clinical Caller Name Shauntavia Eckland Relationship To Patient Self Return Phone Number [phone] (Primary) Chief Complaint Health information question (non symptomatic) Initial Comment Caller states was told not to have dairy food can I eat eggs Nurse Assessment Nurse: Einar Gip, RN, Neoma Laming Date/Time (Eastern Time): 12/26/2014 10:18:09 PM Confirm and document reason for call. If symptomatic, describe symptoms. ---Caller denies symptoms for triage. Caller states she was told not to eat dairy and wants to know if she can eat eggs. Advised dairy would be milk or any milk product so she can eat eggs. Caller verbalized understanding. Has the patient traveled out of the country within the last 30 days? ---Not Applicable Does the  patient have any new or worsening symptoms? ---No Guidelines Guideline Title Affirmed Question Affirmed Notes Nurse Date/Time (Eastern Time) Disp. Time Eilene Ghazi Time) Disposition Final User 12/26/2014 10:23:37 PM Clinical Call Yes Einar Gip, RN, Neoma Laming After Care Instructions Given Call Event Type User Date / Time Description

## 2014-12-27 NOTE — Progress Notes (Signed)
59 y.o.Single white g0p0 here with complaint of vaginal symptoms of itching, burning, external area only. Patient is being treated for IBS and has had frequent stools, which has increased the wetness in vaginal and vulva area. Wears Depends if going out only. Trying to use loose underwear when home for decrease in moisture. Has been to several doctors regarding her health status and enlarge vulvar mass, but conclusion of no cancer at present. Under oncology follow up of at present per patient.  Describes discharge as normal. Onset of symptoms 4-5 days ago. Was told to use [place] soap by PCP and had increase in itching with use. Using Aveeno Eczema cream as previously discussed with good results. Treating self for yeast dermatitis in leg crease areas only.  Not sexually active. Denies urinary symptoms . Patient feels she was doing OK until the IBS episodes. Was also using baby wipes and stopped due to itching. Does not feel the area of vulva has enlarged any more. No other health issues today.  O:Healthy female WDWN Affect: normal, orientation x 3  Exam: Abdomen:soft, non tender Inguinal Lymph node: no enlargement or tenderness Pelvic exam: External genital: normal female, with scaling and exudate noted from cream. Noted redness in creases of groin area with cream (Terazol) noted. Patient brought tube with her. Right labia mass still palpated that is being followed by oncology, does not appear to have changed in size, non tender.  BUS: negative Vagina: scant white discharge noted. , Affirm taken slight atrophy noted Cervix: absent Uterus absent Adnexa: absent, no fullness or masses noted    A:Normal pelvic exam Right vulvar mass being followed by oncology Polymedication use in vulva area Vulvovaginitis Atrophic vaginitis    P:Discussed findings of vulvovaginitis and etiology. Discussed using baking soda rinse, using big cup to mix in, to do reduce yeast production and moisture. Instructions  given. Discussed importance of following up with oncology regarding the mass and recommendations given.  Stop all medication use and resume antifungal powder dusting in groin area again, which helped control before. Use Aveeo eczema cream as needed for itching. Instructions written down for patient to follow. Will treat per affirm results if indicated. Questions addressed.  Rv prn

## 2014-12-29 ENCOUNTER — Ambulatory Visit: Payer: Medicare Other | Admitting: Certified Nurse Midwife

## 2015-01-01 ENCOUNTER — Other Ambulatory Visit: Payer: Self-pay | Admitting: Family Medicine

## 2015-01-01 NOTE — Progress Notes (Signed)
Reviewed personally.  M. Suzanne Keandre Linden, MD.  

## 2015-01-09 ENCOUNTER — Telehealth: Payer: Self-pay | Admitting: Family Medicine

## 2015-01-09 NOTE — Telephone Encounter (Signed)
I called the pt and informed her of the message below and she agreed. 

## 2015-01-09 NOTE — Telephone Encounter (Signed)
Ok

## 2015-01-09 NOTE — Telephone Encounter (Signed)
Pt call to say her bowels has moving ok. And she is asking if she can have some dairy products. Would like a call back

## 2015-01-25 ENCOUNTER — Other Ambulatory Visit: Payer: Self-pay | Admitting: Family Medicine

## 2015-01-29 ENCOUNTER — Telehealth: Payer: Self-pay | Admitting: *Deleted

## 2015-01-29 NOTE — Telephone Encounter (Signed)
FYI - Patient already has appointment this Thursday (02/01/15) to establish care with Dr. Maudie Mercury  ------------------------------------------------------------------------------------------------------------------------------------------------------------------------------------------- PLEASE NOTE: All timestamps contained within this report are represented as [place] Standard Time. CONFIDENTIALTY NOTICE: This fax transmission is intended only for the addressee. It contains information that is legally privileged, confidential or otherwise protected from use or disclosure. If you are not the intended recipient, you are strictly prohibited from reviewing, disclosing, copying using or disseminating any of this information or taking any action in reliance on or regarding this information. If you have received this fax in error, please notify us immediately by telephone so that we can arrange for its return to [place]. Phone: [phone], Toll-Free: [phone], Fax: [phone] Page: 1 of 2 Call Id: [id number] [facility] Night - Client [place] Patient Name: Linda Thompson Gender: Female DOB: 11-27-1955 Age: 60 Y 76 M 25 D Return Phone Number: [phone] (Primary) Address: [address] City/State/Zip: [name] [address] Client [facility] Night - Client Client [facility] - Night Physician [name] Contact Type Call Call Type Triage / Clinical Relationship To Patient Self Return Phone Number [phone] (Primary) Chief Complaint Rectal Symptoms Initial Comment Caller states she has creme for her rectum. She is having trouble. PreDisposition Call Doctor Nurse Assessment Nurse: Georgina Peer, RN, Kendrick Fries Date/Time Eilene Ghazi Time): 01/26/2015 9:35:01 PM Confirm and document reason for call. If symptomatic, describe symptoms. ---Caller states she has itching problems with her anus. Has the  patient traveled out of the country within the last 30 days? ---No Does the patient have any new or worsening symptoms? ---No Guidelines Guideline Title Affirmed Question Affirmed Notes Nurse Date/Time Eilene Ghazi Time) Rectal Symptoms Mild rectal itching (all triage questions negative) Georgina Peer, RN, Kendrick Fries 01/26/2015 9:39:24 PM Disp. Time Eilene Ghazi Time) Disposition Final User 01/26/2015 [place] Yes Georgina Peer, RN, Cipriano Bunker Understands: Yes Disagree/Comply: Comply Care Advice Given Per Guideline HOME CARE: You should be able to treat this at home. REASSURANCE: The main treatment for rectal itching is to keep the rectal area clean, dry, and to avoid excessive rubbing or scratching. CLEANSING AFTER BM: * Cleanse the anus with warm water after each bowel movement. Use wet cotton or tissue. * Pat the area dry using unscented toilet paper. Avoid rubbing area with toilet paper. * Sit in a warm sitz bath for 20 minutes twice a day. This will decrease swelling and irritation, keep the area clean, and help with healing. PREVENTING ITCHING: CALL BACK IF: * You become worse. CARE ADVICE given per Rectal Symptoms (Adult) guideline. * Wear cotton underwear. * Itching not improved after 3 days HYDROCORTISONE OINTMENT FOR RECTAL ITCHING: PLEASE NOTE: All timestamps contained within this report are represented as [place] Standard Time. CONFIDENTIALTY NOTICE: This fax transmission is intended only for the addressee. It contains information that is legally privileged, confidential or otherwise protected from use or disclosure. If you are not the intended recipient, you are strictly prohibited from reviewing, disclosing, copying using or disseminating any of this information or taking any action in reliance on or regarding this information. If you have received this fax in error, please notify us immediately by telephone so that we can arrange for its return to [place]. Phone: [phone], Toll-Free:  [phone], Fax: [phone] Page: 2 of 2 Call Id: [id number] After Care Instructions Given Call Event Type User Date / Time Description Comments User: Mike Gip, RN Date/Time Eilene Ghazi Time): 01/26/2015 9:45:57 PM Pt has an appt  on Thursday & i instructed her to tellher Dr to take a look at it.

## 2015-02-01 ENCOUNTER — Encounter: Payer: Self-pay | Admitting: Family Medicine

## 2015-02-01 ENCOUNTER — Ambulatory Visit (INDEPENDENT_AMBULATORY_CARE_PROVIDER_SITE_OTHER): Payer: Medicare Other | Admitting: Family Medicine

## 2015-02-01 VITALS — BP 112/82 | HR 102 | Temp 98.3°F | Ht 62.0 in | Wt 194.1 lb

## 2015-02-01 DIAGNOSIS — E785 Hyperlipidemia, unspecified: Secondary | ICD-10-CM

## 2015-02-01 DIAGNOSIS — I10 Essential (primary) hypertension: Secondary | ICD-10-CM

## 2015-02-01 DIAGNOSIS — D320 Benign neoplasm of cerebral meninges: Secondary | ICD-10-CM

## 2015-02-01 DIAGNOSIS — D869 Sarcoidosis, unspecified: Secondary | ICD-10-CM

## 2015-02-01 DIAGNOSIS — E119 Type 2 diabetes mellitus without complications: Secondary | ICD-10-CM | POA: Diagnosis not present

## 2015-02-01 DIAGNOSIS — E669 Obesity, unspecified: Secondary | ICD-10-CM

## 2015-02-01 DIAGNOSIS — K219 Gastro-esophageal reflux disease without esophagitis: Secondary | ICD-10-CM

## 2015-02-01 DIAGNOSIS — D649 Anemia, unspecified: Secondary | ICD-10-CM | POA: Diagnosis not present

## 2015-02-01 LAB — CBC WITH DIFFERENTIAL/PLATELET
BASOS ABS: 0 10*3/uL (ref 0.0–0.1)
Basophils Relative: 0.4 % (ref 0.0–3.0)
EOS ABS: 0.4 10*3/uL (ref 0.0–0.7)
EOS PCT: 4.2 % (ref 0.0–5.0)
HCT: 36.9 % (ref 36.0–46.0)
HEMOGLOBIN: 10.8 g/dL — AB (ref 12.0–15.0)
LYMPHS ABS: 1.4 10*3/uL (ref 0.7–4.0)
Lymphocytes Relative: 15.2 % (ref 12.0–46.0)
MCHC: 29.1 g/dL — ABNORMAL LOW (ref 30.0–36.0)
MCV: 66.8 fl — ABNORMAL LOW (ref 78.0–100.0)
MONO ABS: 0.6 10*3/uL (ref 0.1–1.0)
Monocytes Relative: 6.7 % (ref 3.0–12.0)
NEUTROS PCT: 73.5 % (ref 43.0–77.0)
Neutro Abs: 6.8 10*3/uL (ref 1.4–7.7)
Platelets: 524 10*3/uL — ABNORMAL HIGH (ref 150.0–400.0)
RBC: 5.53 Mil/uL — AB (ref 3.87–5.11)
RDW: 18.5 % — ABNORMAL HIGH (ref 11.5–15.5)
WBC: 9.2 10*3/uL (ref 4.0–10.5)

## 2015-02-01 LAB — BASIC METABOLIC PANEL
BUN: 19 mg/dL (ref 6–23)
CALCIUM: 9.5 mg/dL (ref 8.4–10.5)
CO2: 27 mEq/L (ref 19–32)
Chloride: 105 mEq/L (ref 96–112)
Creatinine, Ser: 0.83 mg/dL (ref 0.40–1.20)
GFR: 74.41 mL/min (ref >60.00)
GLUCOSE: 119 mg/dL — AB (ref 70–99)
POTASSIUM: 4.8 meq/L (ref 3.5–5.1)
SODIUM: 141 meq/L (ref 135–145)

## 2015-02-01 LAB — HEMOGLOBIN A1C: Hgb A1c MFr Bld: 6.7 % — ABNORMAL HIGH (ref 4.6–6.5)

## 2015-02-01 NOTE — Progress Notes (Signed)
Ascencion Dike is HCPOA [phone] Cell number: [phone]

## 2015-02-01 NOTE — Progress Notes (Signed)
Pre visit review using our clinic review tool, if applicable. No additional management support is needed unless otherwise documented below in the visit note. 

## 2015-02-01 NOTE — Progress Notes (Addendum)
HPI:  Linda Thompson is here to establish care. She used to see [employer] and actually has seen me for acute visits in the past, but is here today to transfer care. She is a very sweet woman, but with significant mental retardation, and per caregiver chronic anxiety and frequent doctor visits - she reported that pt has"driven other doctor's crazy" in the past.  Pt is not aware of many parts of her history. HCPOA is usually not present for visits unfortunately.  Last PCP and physical:[facility].  Has the following chronic problems that require follow up and concerns today:  HTN: -meds: asa, hctz 12.5, diltiazem 240mg  daily from prior PCP -listed allergy to acei -denies: CP, SOB, DOE  DM: -meds: asa, metformin: 500mg  bid -see Health Maintenance section for preventive care due -sees podiatrist -diet and exercise: poor -denies: polyuria, polydipsia, vision changes  HLD: -meds: statin -denies: statin intolerance  Seasonal allergies: -meds: claritin, atrovent  GERD/IBS: -meds: prilosec 20mg  -denies: diarrhea, constipation, acid reflux  Hx Glaucoma: -sees Dr. Katy Fitch  Sarcoidosis: -she is not aware of this despite numerous conversations in the past -workup for difuse LAD and bony lesions in 2016 with oncology with MRI, PET and lymph node biospy confirmed sarcoidosis -she has reported chronic pain in the low back and hips (denied this at time of work up and today so no tx currently); no pulm symptoms -she was supposed to be referred to rheum several months ago, but I do not see where this referral was placed - she did opt to do this  -denies: SOB, DOE, cough, fevers, malaise, weight loss  Iron def Anemia: -she is not aware of this -per heme/onc notes iron replacement advised -she is not taking iron -denies: melena, hematochezia  Meningioma: -she is not aware of this -noted on roc today -ant falx, on prior imaging -appears  imaged several times in the past and stable, no neuroimaging recently -denies: HAs, vision changes, changes in mental status  ROS negative for unless reported above: fevers, unintentional weight loss, hearing or vision loss, chest pain, palpitations, struggling to breath, hemoptysis, melena, hematochezia, hematuria, falls, loc, si, thoughts of self harm  Past Medical History  Diagnosis Date  . Type II or unspecified type diabetes mellitus without mention of complication, not stated as uncontrolled   . Hypertension   . Hyperlipemia   . GERD (gastroesophageal reflux disease)     w/ hx diaphragmatic hernia  . Sarcoidosis ([place])     difuse bony lesions and LDA, dx by biopsy in 2016  . Osteoarthrosis, unspecified whether generalized or localized, ankle and foot   . DDD (degenerative disc disease), lumbar   . Glaucoma   . IBS (irritable bowel syndrome)   . Meningioma ([place])     ant falx, stable on prior imaging  . Moderate intellectual disabilities   . Transaminitis   . Obesity   . Chronic low back pain   . Allergic rhinitis   . Hx of fracture     R arm, foot  . Heart murmur   . Low back pain 11/22/2012  . GLAUCOMA NOS 03/11/2006    Qualifier: Diagnosis of  By: Diona Browner MD, Amy      Past Surgical History  Procedure Laterality Date  . Total abdominal hysterectomy  03/05/1992    leiomyoma and cellular leiomyoma  . Cataract extraction    . Elbow surgery      right elbow  . Lipoma excision  Left [id number]    posterior left axilla    Family History  Problem Relation Age of Onset  . Diabetes Mother   . Stroke Mother   . Hypertension Mother   . Diabetes Maternal Aunt   . Heart disease Maternal Aunt      (had pacemaker)  . Diabetes Maternal Grandmother   . Stroke Maternal Grandmother   . Hypertension Maternal Grandmother   . Stroke Father     Social History   Social History  . Marital Status: Single    Spouse Name: N/A  . Number of Children: 0  . Years of Education: N/A    Occupational History  . Disabled (from arm fracture)    Social History Main Topics  . Smoking status: Never Smoker   . Smokeless tobacco: Never Used  . Alcohol Use: No  . Drug Use: No  . Sexual Activity: Not Currently    Birth Control/ Protection: Surgical     Comment: Hyst--TAH--unsure if has ovaries   Other Topics Concern  . None   Social History Narrative   Lives at [facility] (on [address] in [place])      [place] for arm fracture      Mental Retardation, but is able to drive to places she is comfortable with and prepare her own meals and manage most of her own affairs.      [name] (neighbor) is POA/HCPOA      Reports she gets regular exercise and tries to eat healthy     Current outpatient prescriptions:  .  acetaminophen (TYLENOL) 500 MG tablet, Take 500 mg by mouth as needed (left ear pain)., Disp: , Rfl:  .  aspirin EC 81 MG tablet, Take 81 mg by mouth daily., Disp: , Rfl:  .  diltiazem (CARDIZEM CD) 240 MG 24 hr capsule, TAKE ONE CAPSULE BY MOUTH EVERY DAY (Patient taking differently: TAKE 240 MG BY MOUTH DAILY), Disp: 90 capsule, Rfl: 3 .  hydrochlorothiazide (MICROZIDE) 12.5 MG capsule, TAKE 1 CAPSULE BY MOUTH ONCE DAILY, Disp: 90 capsule, Rfl: 0 .  ipratropium (ATROVENT) 0.06 % nasal spray, Place 2 sprays into the nose 4 (four) times daily., Disp: , Rfl: 5 .  ketoconazole (NIZORAL) 2 % shampoo, APPLY TO AFFECTED AREA TWICE A WEEK AS DIRECTED, Disp: 120 mL, Rfl: 1 .  KLOR-CON M20 20 MEQ tablet, TAKE 1 TABLET BY MOUTH EVERY DAY, Disp: 90 tablet, Rfl: 3 .  latanoprost (XALATAN) 0.005 % ophthalmic solution, Place 1 drop into both eyes at bedtime. , Disp: , Rfl:  .  loratadine (CLARITIN) 10 MG tablet, Take 10 mg by mouth daily., Disp: , Rfl:  .  metFORMIN (GLUCOPHAGE) 500 MG tablet, TAKE 1 TABLET BY MOUTH TWICE A DAY WITH A MEAL, Disp: 180 tablet, Rfl: 1 .  nystatin (MYCOSTATIN/NYSTOP) 100000 UNIT/GM POWD, Take as  directed, Disp: , Rfl:  .  omeprazole (PRILOSEC) 20 MG capsule, TAKE 1 CAPSULE (20 MG TOTAL) BY MOUTH 2 (TWO) TIMES DAILY., Disp: 180 capsule, Rfl: 3 .  pravastatin (PRAVACHOL) 40 MG tablet, TAKE 1 TABLET BY MOUTH EVERY EVENING (Patient taking differently: TAKE 40 MG BY MOUTH DAILY), Disp: 90 tablet, Rfl: 2 .  triamcinolone cream (KENALOG) 0.1 %, Apply 1 application topically 2 (two) times daily as needed. (Patient taking differently: Apply 1 application topically 2 (two) times daily as needed (skin irritation). ), Disp: 30 g, Rfl: 0 .  [DISCONTINUED] Calcium Carbonate-Vitamin D (CALCIUM-VITAMIN D) 500-200 MG-UNIT per tablet, Take 1 tablet  by mouth 2 (two) times daily with a meal.  , Disp: , Rfl:  .  [DISCONTINUED] diltiazem (TIAZAC) 240 MG 24 hr capsule, Take 1 capsule (240 mg total) by mouth daily., Disp: 90 capsule, Rfl: 0 .  [DISCONTINUED] fexofenadine (ALLEGRA) 180 MG tablet, Take 1 tablet (180 mg total) by mouth daily., Disp: 15 tablet, Rfl: 0  EXAM:  Filed Vitals:   02/01/15 1406  BP: 112/82  Pulse: 102  Temp: 98.3 F (36.8 C)    Body mass index is 35.49 kg/(m^2).  GENERAL: vitals reviewed and listed above, alert, oriented, appears well hydrated and in no acute distress  HEENT: atraumatic, conjunttiva clear, no obvious abnormalities on inspection of external nose and ears  NECK: no obvious masses on inspection  LUNGS: clear to auscultation bilaterally, no wheezes, rales or rhonchi, good air movement  CV: HRRR, no peripheral edema  MS: moves all extremities without noticeable abnormality  PSYCH: pleasant and cooperative, no obvious depression or anxiety, she is fixated on her yeast cream for her chronic vaginitis and repeat herself frequently  ASSESSMENT AND PLAN:  > 40 minutes spent with this patient with >50% in face to face counesling with this very complicated patient. Discussed the following assessment and plan:  Type 2 diabetes mellitus without complication, without  long-term current use of insulin ([place]) - Plan: Hemoglobin A1c  Hyperlipemia  Essential hypertension - Plan: Basic metabolic panel  Anemia, unspecified anemia type - Plan: CBC with Differential  Sarcoidosis ([place]) - Plan: Ambulatory referral to Rheumatology  Gastroesophageal reflux disease without esophagitis  Obesity  MENINGIOMA - Plan: MR Brain W Wo Contrast  -labs -lifestyle recs -if anemic on labs will have her see GI for eval and likely start iron therapy as this is new since last endoscopy -rheum eval for sarcoid and pain -she is not aware of her hx of the meningioma listed in her chart she asked me at this visit to call [place] to discuss with her and ask her about doing imaging - plan to call her and likely re-image, consider NSU eval if dx confirmed and large - seems to be asymptomatic -foot exam done - sees podiatry -she does want to see rheum about the sarcoid, though she had forgotten she had this - referral placed - no symptoms currently  -We reviewed the PMH, PSH, FH, SH, Meds and Allergies. -We provided refills for any medications we will prescribe as needed. -We addressed current concerns per orders and patient instructions. -We have asked for records for pertinent exams, studies, vaccines and notes from previous providers. -We have advised patient to follow up per instructions below.  Addendum: spoke with [name]. Will go ahead w imaging. Dicussed with Radiology and MRI advised as best imaging modality at this time with complicated hx.  -Patient advised to return or notify a doctor immediately if symptoms worsen or persist or new concerns arise.  Patient Instructions  BEFORE YOU LEAVE: -labs -follow up in 3-4 months  We recommend the following healthy lifestyle measures: - eat a healthy whole foods diet consisting of regular small meals composed of vegetables, fruits, beans, nuts, seeds, healthy meats such as white chicken and fish and whole  grains.  - avoid sweets, white starchy foods, fried foods, fast food, processed foods, sodas, red meet and other fattening foods.  - get a least 150-300 minutes of aerobic exercise per week.   -We have ordered labs or studies at this visit. It can take up to 1-2 weeks  for results and processing. We will contact you with instructions IF your results are abnormal. Normal results will be released to your [facility]. If you have not heard from [place] or can not find your results in [facility] in 2 weeks please contact our office.  -We placed a referral for you as discussed. It usually takes about 1-2 weeks to process and schedule this referral. If you have not heard from [place] regarding this appointment in 2 weeks please contact our office.              [name] R.

## 2015-02-01 NOTE — Addendum Note (Signed)
Addended by: [name] on: 02/01/2015 05:09 PM   Modules accepted: Orders, Level of Service

## 2015-02-01 NOTE — Patient Instructions (Addendum)
BEFORE YOU LEAVE: -labs -follow up in 3-4 months  We recommend the following healthy lifestyle measures: - eat a healthy whole foods diet consisting of regular small meals composed of vegetables, fruits, beans, nuts, seeds, healthy meats such as white chicken and fish and whole grains.  - avoid sweets, white starchy foods, fried foods, fast food, processed foods, sodas, red meet and other fattening foods.  - get a least 150-300 minutes of aerobic exercise per week.   -We have ordered labs or studies at this visit. It can take up to 1-2 weeks for results and processing. We will contact you with instructions IF your results are abnormal. Normal results will be released to your [facility]. If you have not heard from [place] or can not find your results in [facility] in 2 weeks please contact our office.  -We placed a referral for you as discussed. It usually takes about 1-2 weeks to process and schedule this referral. If you have not heard from [place] regarding this appointment in 2 weeks please contact our office.

## 2015-02-05 NOTE — Addendum Note (Signed)
Addended by: Ailene Rud E on: 02/05/2015 11:03 AM   Modules accepted: Orders

## 2015-02-06 ENCOUNTER — Ambulatory Visit: Payer: Medicare Other | Admitting: Gastroenterology

## 2015-02-08 ENCOUNTER — Ambulatory Visit
Admission: RE | Admit: 2015-02-08 | Discharge: 2015-02-08 | Disposition: A | Discharge status: Home / Self Care | Payer: Medicare Other | Source: Ambulatory Visit | Attending: Family Medicine | Admitting: Family Medicine

## 2015-02-08 DIAGNOSIS — D320 Benign neoplasm of cerebral meninges: Secondary | ICD-10-CM

## 2015-02-08 DIAGNOSIS — G939 Disorder of brain, unspecified: Secondary | ICD-10-CM | POA: Diagnosis not present

## 2015-02-08 MED ORDER — GADOBENATE DIMEGLUMINE 529 MG/ML IV SOLN
18.0000 mL | Freq: Once | INTRAVENOUS | Status: AC | PRN
  Administered 2015-02-08: 18 mL via INTRAVENOUS

## 2015-02-12 ENCOUNTER — Telehealth: Payer: Self-pay | Admitting: Certified Nurse Midwife

## 2015-02-12 NOTE — Telephone Encounter (Signed)
Patient is asking to talk with Regina Eck nurse. Patient says this is not urgent.

## 2015-02-12 NOTE — Telephone Encounter (Signed)
Spoke with patient. Patient is calling to verify how [name] recommended that she use baking soda to relieve external itching. Advised patient [name] recommends that she use a baking soda rinse using a big cup to mix it in, to reduce yeast production as needed (please see OV note from 12/27/2014). Patient is agreeable to instructions. States she does not have a bath tub so she has been placing the baking soda and water mixture onto a wash cloth and gently placing it onto the vagina. Advised this is okay to do. Advised she may sit on the toilet and poor the mixture over the vagina if this will help. Patient is agreeable. Patient denies any current symptoms or concerns. Advised if her symptoms return and she is experiencing increased irritation or discharge she will need to be seen for evaluation with [name]. Patient is agreeable and verbalizes understanding.  Routing to provider for final review. Patient agreeable to disposition. Will close encounter.

## 2015-02-13 ENCOUNTER — Telehealth: Payer: Self-pay | Admitting: Family Medicine

## 2015-02-13 DIAGNOSIS — D320 Benign neoplasm of cerebral meninges: Secondary | ICD-10-CM

## 2015-02-13 NOTE — Telephone Encounter (Signed)
Discussed CT results with [name] whom pt has advised to discussed all results with and to make decisions about testing.  They opted to not precede with CT at this time as they feel like she has been through too may tests, aware of potential etiologies. May consider in the future, but they have declined at this time. Advised to let us know if they wound change their mind and wish to re-image. Will plan to discuss further at next visit. Pt has had recent extensive eval for ca and has sarcoid.

## 2015-02-19 ENCOUNTER — Telehealth: Payer: Self-pay | Admitting: Certified Nurse Midwife

## 2015-02-19 NOTE — Telephone Encounter (Signed)
Patient returned call

## 2015-02-19 NOTE — Telephone Encounter (Signed)
Returned call to patient. She states that she has been using Aveeno ecxema cream and baking soda baths and she still feels external irritation and burning of skin. She states this has been increasing over the weekend. No fevers or flank or abdominal pain.  She states she called to on call provider and was advised to go to ER if symptoms worsened.  Patient offered office visit and she agrees. She will call her neighbor to see if she can bring her to an appointment tomorrow.  She will call back with response.

## 2015-02-19 NOTE — Telephone Encounter (Signed)
Patient calling requesting to speak with the nurse to follow up on a conversation from last week. See last telephone encounter.

## 2015-02-19 NOTE — Telephone Encounter (Signed)
Call to patient. She states she will need a call back later today due to house cleaners at her apartment.

## 2015-02-20 NOTE — Telephone Encounter (Signed)
Patient was scheduled by front desk for Thursday at 1345 with [name]. Will close encounter.

## 2015-02-21 ENCOUNTER — Ambulatory Visit (INDEPENDENT_AMBULATORY_CARE_PROVIDER_SITE_OTHER): Payer: Medicare Other | Admitting: Podiatry

## 2015-02-21 ENCOUNTER — Encounter: Payer: Self-pay | Admitting: Podiatry

## 2015-02-21 DIAGNOSIS — M79673 Pain in unspecified foot: Secondary | ICD-10-CM | POA: Diagnosis not present

## 2015-02-21 DIAGNOSIS — E1140 Type 2 diabetes mellitus with diabetic neuropathy, unspecified: Secondary | ICD-10-CM

## 2015-02-21 DIAGNOSIS — B351 Tinea unguium: Secondary | ICD-10-CM

## 2015-02-21 NOTE — Progress Notes (Signed)
Patient ID: Linda Thompson, female   DOB: 1955-02-23, 60 y.o.   MRN: [id number] Complaint:  Visit Type: Patient returns to my office for continued preventative foot care services. Complaint: Patient states" my nails have grown long and thick and become painful to walk and wear shoes" Patient has been diagnosed with DM with no foot complications. The patient presents for preventative foot care services. No changes to ROS  Podiatric Exam: Vascular: dorsalis pedis and posterior tibial pulses are palpable bilateral. Capillary return is immediate. Temperature gradient is WNL. Skin turgor WNL  Sensorium: Normal Semmes Weinstein monofilament test. Normal tactile sensation bilaterally. Nail Exam: Pt has thick disfigured discolored nails with subungual debris noted bilateral entire nail hallux through fifth toenails Ulcer Exam: There is no evidence of ulcer or pre-ulcerative changes or infection. Orthopedic Exam: Muscle tone and strength are WNL. No limitations in general ROM. No crepitus or effusions noted. Foot type and digits show no abnormalities. Bony prominences are unremarkable. Skin: No Porokeratosis. No infection or ulcers  Diagnosis:  Onychomycosis, , Pain in right toe, pain in left toes  Treatment & Plan Procedures and Treatment: Consent by patient was obtained for treatment procedures. The patient understood the discussion of treatment and procedures well. All questions were answered thoroughly reviewed. Debridement of mycotic and hypertrophic toenails, 1 through 5 bilateral and clearing of subungual debris. No ulceration, no infection noted.  Return Visit-Office Procedure: Patient instructed to return to the office for a follow up visit 3 months for continued evaluation and treatment.   Gardiner Barefoot DPM

## 2015-02-22 ENCOUNTER — Ambulatory Visit (INDEPENDENT_AMBULATORY_CARE_PROVIDER_SITE_OTHER): Payer: Medicare Other | Admitting: Certified Nurse Midwife

## 2015-02-22 ENCOUNTER — Encounter: Payer: Self-pay | Admitting: Certified Nurse Midwife

## 2015-02-22 VITALS — BP 118/74 | HR 74 | Resp 16 | Ht 62.0 in

## 2015-02-22 DIAGNOSIS — B373 Candidiasis of vulva and vagina: Secondary | ICD-10-CM

## 2015-02-22 DIAGNOSIS — B372 Candidiasis of skin and nail: Secondary | ICD-10-CM

## 2015-02-22 DIAGNOSIS — B3731 Acute candidiasis of vulva and vagina: Secondary | ICD-10-CM

## 2015-02-22 MED ORDER — FLUCONAZOLE 150 MG PO TABS: ORAL_TABLET | ORAL | Status: DC

## 2015-02-22 NOTE — Progress Notes (Signed)
60 y o white female g0p0 here with complaint of vaginal symptoms of burning, when urine touches skin. Patient has not noted any increase in vaginal discharge. Area of concern is more external tissue and groin creases. Onset of symptoms 7 days ago. Denies new personal products.  Urinary symptoms none . Menopausal no vaginal dryness. Patient has been using Kenalog cream she has on the areas of redness in the groin crease areas. Uses depends daily, but not if she is home and not at bedtime. No urinary incontinence issues. No change in right vulva area. Seeing oncologist in 3/17 for follow up. Patient is a diabetic on oral medication good control per patient. No other concerns today.   O:Healthy female WDWN Affect: normal, orientation x 3 Patient has some degree of mental retardation, but cares for self in supervised apartments.  Exam:skin: occasional dry skin area noted, no sores or open areas Abdomen: non tender, soft Inguinal Lymph node: no enlargement or tenderness Pelvic exam: External genital: normal female, with increase pink on vulva and scaling with exudate noted. Right Vulvar mass unchanged in size. Wet prep taken. Groin area bilateral redness with moisture and yeast odor with exudate and cream applied. Wet prep taken BUS: negative Vagina: scant discharge noted. Ph:4.0   ,Wet prep taken,  Cervix:absent Uterus: absent Adnexa: not present, no fullness or masses noted Rectal area: normal appearance, no redness   Wet Prep results: external vulva area/vagina  and groin area positive for yeast   A:Normal pelvic exam Yeast dermatitis Yeast vaginitis and vulvitis Diabetic type 2 good control Medication overuse Some degree of mental retardation   P:Discussed findings of yeast dermatitis,vulvitis,vaginitis and etiology. Discussed  baking soda in large cup to pour over area in groin area in shower  for comfort. Dry areas completely with blow dryer, she knows how to use. Avoid moist clothes  or pads for extended period of time. Go without underwear as frequent as possible. Stop Kenalog cream use to area. Apply no creams or powders to the area now. Expose to air as much as possible. Rx: Diflucan see order with instructions. Patient repeated instructions back to provider for use of medication and above.  Rv one week, prn

## 2015-02-22 NOTE — Patient Instructions (Signed)
Take one pill of prescription today and repeat one on Tuesday.(5 days). Use baking soda rinse after bath and use blow dryer to inner leg area and leave off underwear.  You will see me again in one week.  Debbi

## 2015-02-23 ENCOUNTER — Telehealth: Payer: Self-pay | Admitting: Certified Nurse Midwife

## 2015-02-23 NOTE — Telephone Encounter (Signed)
Patient wants to speak with the nurse no information given. °

## 2015-02-23 NOTE — Telephone Encounter (Signed)
Spoke with patient. She reports she was seen in the office yesterday with [name]. She was prescribed Diflucan for yeast. Asking if each pills is 150 mg. Advised 1 pill is 150 mg. Advised she only needs to take 1 at this time. Reports she took 1 Diflucan 150 mg last night. She woke up this morning and is still having vaginal itching. Advised this is not uncommon as it can take up to 48 hours for the medication to relieve symptoms. Advised if her symptoms persist she can take the second pill on Sunday. She is agreeable. Advised per [name] note she is not to use any powders or creams to the vaginal area. She is agreeable and verbalizes understanding. She is scheduled for a recheck appointment on 03/02/2015 at 1:45 pm with [name].  Routing to provider for final review. Patient agreeable to disposition. Will close encounter.

## 2015-02-25 ENCOUNTER — Telehealth: Payer: Self-pay | Admitting: Gynecology

## 2015-02-25 NOTE — Progress Notes (Signed)
Reviewed personally.  M. Suzanne Dezmin Kittelson, MD.  

## 2015-02-25 NOTE — Telephone Encounter (Signed)
On Call Note 6:15 AM :  Evaluated 02/22/2015 and treated for yeast vaginitis (see note).  Took diflucan 150 mg and told to repeat in 5 days.  Still with itching. I told the patient to repeat diflucan dose now and follow up in the office if itching continues.  If taking cholesterol medications, hold dose today.

## 2015-02-26 ENCOUNTER — Telehealth: Payer: Self-pay | Admitting: Certified Nurse Midwife

## 2015-02-26 NOTE — Telephone Encounter (Signed)
Patient calling to speak with nurse about her medication. °

## 2015-02-26 NOTE — Telephone Encounter (Signed)
Spoke with patient. Patient was seen on 02/22/2015 with [name]. Diagnosed with a yeast infection. Prescribed Diflucan 150 mg #2 0RF. Patient took her first pill on 02/22/2015. Patient called into the office on 02/23/2015 reporting vaginal itching that was persisting. Advised it can take 48 hours for the Diflucan to start working and she may take second Diflucan on Sunday if symptoms persist. Over the weekend patient called the on call doctor and was advised to take the second Diflucan on 02/25/2015 due to persistent itching. Patient took the second Diflucan on 02/25/2015. States she is still having some itching. Advised this is not uncommon and it can take both doses to clear a yeast infection. Advised it can take another 48-72 hours for symptoms to resolve. She is agreeable. Advised she will need to keep her follow up as scheduled for 03/02/2015 with [name]. She is agreeable.  Routing to provider for final review. Patient agreeable to disposition. Will close encounter.

## 2015-02-28 ENCOUNTER — Telehealth: Payer: Self-pay | Admitting: Certified Nurse Midwife

## 2015-02-28 NOTE — Telephone Encounter (Signed)
Patient has questions about her medication.  °

## 2015-02-28 NOTE — Telephone Encounter (Signed)
Patient called and asked if it is okay to take Loperamide -AD when she has taken Diflucan. Advised she can take the anti-diarrheal medication but do not advise every day use. Patient states she has history of IBS and is having increased diarrhea. This is not different for her.  She states her itching is improved, but still having some itching. She has a follow up scheduled for 2/10 and will keep as scheduled.  No fevers or abdominal pain.  Routing to provider for final review. Patient agreeable to disposition. Will close encounter.

## 2015-03-02 ENCOUNTER — Telehealth: Payer: Self-pay

## 2015-03-02 ENCOUNTER — Encounter: Payer: Self-pay | Admitting: Certified Nurse Midwife

## 2015-03-02 ENCOUNTER — Ambulatory Visit (INDEPENDENT_AMBULATORY_CARE_PROVIDER_SITE_OTHER): Payer: Medicare Other | Admitting: Certified Nurse Midwife

## 2015-03-02 VITALS — BP 116/72 | HR 70 | Resp 16 | Ht 62.0 in | Wt 196.0 lb

## 2015-03-02 DIAGNOSIS — B373 Candidiasis of vulva and vagina: Secondary | ICD-10-CM | POA: Diagnosis not present

## 2015-03-02 DIAGNOSIS — N898 Other specified noninflammatory disorders of vagina: Secondary | ICD-10-CM | POA: Diagnosis not present

## 2015-03-02 DIAGNOSIS — B372 Candidiasis of skin and nail: Secondary | ICD-10-CM | POA: Diagnosis not present

## 2015-03-02 NOTE — Patient Instructions (Signed)

## 2015-03-02 NOTE — Progress Notes (Signed)
60 y.o. Single Caucasian female [name] here for follow up of yeast dermatitis treated with Diflucan. Patient completed medication as prescribed. Patient has also been using Terazol cream around rectal area for prn itching. Patient feels all areas in groin and vulva and vaginal opening are resolved. "So much better, did what you said". No other health issues today.  O:Healthy female WDWN Affect: normal, orientation x 3  Exam: Abdomen:soft Lymph node: no enlargement or tenderness Pelvic exam: External genital: normal female, no redness, exudate or tenderness noted in vulva,groin or perineal area now BUS: negative Vagina: scant white discharge noted.  Affirm taken Cervix: absent Uterus absent Adnexa no masses or fullness     A:Normal pelvic exam Yeast dermatitis resolved ?yeast vaginitis resolved Limited comprehension   P:Discussed findings of all cleared external and will wait until affirm is back to see if yeast vaginal is present. No more cream use in groin, or vulva use.If itching come, instead of treating. Can use cream around rectum one more day and stop. Questions addressed numerous repetitions.  Rv prn

## 2015-03-02 NOTE — Telephone Encounter (Signed)
Spoke with patient at time of incoming call. Patient states she is still experiencing vaginal itching as well as rectal itching. Patient is asking if she may use a cream she has a home to help with her itching. Patient is unsure of the name of the cream. Advised not to apply cream to the area at this time because depending on the cream it could cause more irritation. The patient has an appointment scheduled for today 2/10/20107 at 1:45 pm with [name]. Advised to bring the cream with her to her appointment so that [name] can review the cream and make further recommendations. She is agreeable.  Routing to provider for final review. Patient agreeable to disposition. Will close encounter.

## 2015-03-03 LAB — WET PREP BY MOLECULAR PROBE
CANDIDA SPECIES: NEGATIVE
GARDNERELLA VAGINALIS: NEGATIVE
Trichomonas vaginosis: NEGATIVE

## 2015-03-05 ENCOUNTER — Telehealth: Payer: Self-pay | Admitting: Certified Nurse Midwife

## 2015-03-05 ENCOUNTER — Telehealth: Payer: Self-pay

## 2015-03-05 ENCOUNTER — Encounter: Payer: Self-pay | Admitting: Family Medicine

## 2015-03-05 ENCOUNTER — Ambulatory Visit (INDEPENDENT_AMBULATORY_CARE_PROVIDER_SITE_OTHER): Payer: Medicare Other | Admitting: Family Medicine

## 2015-03-05 VITALS — BP 110/80 | HR 103 | Temp 97.3°F | Ht 62.0 in | Wt 195.0 lb

## 2015-03-05 DIAGNOSIS — L309 Dermatitis, unspecified: Secondary | ICD-10-CM

## 2015-03-05 NOTE — Progress Notes (Signed)
HPI:  Acute visit for:  Rash: -reports has dry skin and just got cerave cream -for a week itchy patch on L shin -denies pain, fevers, malaise, oozing  ROS: See pertinent positives and negatives per HPI.  Past Medical History  Diagnosis Date  . Type II or unspecified type diabetes mellitus without mention of complication, not stated as uncontrolled   . Hypertension   . Hyperlipemia   . GERD (gastroesophageal reflux disease)     w/ hx diaphragmatic hernia  . Sarcoidosis ([place])     difuse bony lesions and LDA, dx by biopsy in 2016  . Osteoarthrosis, unspecified whether generalized or localized, ankle and foot   . DDD (degenerative disc disease), lumbar   . Glaucoma   . IBS (irritable bowel syndrome)   . Meningioma ([place])     ant falx, stable on prior imaging  . Moderate intellectual disabilities   . Transaminitis   . Obesity   . Chronic low back pain   . Allergic rhinitis   . Hx of fracture     R arm, foot  . Heart murmur   . Low back pain 11/22/2012  . GLAUCOMA NOS 03/11/2006    Qualifier: Diagnosis of  By: Diona Browner MD, Amy      Past Surgical History  Procedure Laterality Date  . Total abdominal hysterectomy  03/05/1992    leiomyoma and cellular leiomyoma  . Cataract extraction    . Elbow surgery      right elbow  . Lipoma excision Left [id number]    posterior left axilla    Family History  Problem Relation Age of Onset  . Diabetes Mother   . Stroke Mother   . Hypertension Mother   . Diabetes Maternal Aunt   . Heart disease Maternal Aunt      (had pacemaker)  . Diabetes Maternal Grandmother   . Stroke Maternal Grandmother   . Hypertension Maternal Grandmother   . Stroke Father     Social History   Social History  . Marital Status: Single    Spouse Name: N/A  . Number of Children: 0  . Years of Education: N/A   Occupational History  . Disabled (from arm fracture)    Social History Main Topics  . Smoking status: Never Smoker   . Smokeless tobacco:  Never Used  . Alcohol Use: No  . Drug Use: No  . Sexual Activity: Not Currently    Birth Control/ Protection: Surgical     Comment: Hyst--TAH--unsure if has ovaries   Other Topics Concern  . None   Social History Narrative   Lives at [facility] (on [address] in [place])      Craig for arm fracture      Mental Retardation, but is able to drive to places she is comfortable with and prepare her own meals and manage most of her own affairs.      [name] (neighbor) is POA/HCPOA      Reports she gets regular exercise and tries to eat healthy     Current outpatient prescriptions:  .  acetaminophen (TYLENOL) 500 MG tablet, Take 500 mg by mouth as needed (left ear pain)., Disp: , Rfl:  .  aspirin EC 81 MG tablet, Take 81 mg by mouth daily., Disp: , Rfl:  .  diltiazem (CARDIZEM CD) 240 MG 24 hr capsule, TAKE ONE CAPSULE BY MOUTH EVERY DAY (Patient taking differently: TAKE 240 MG BY MOUTH DAILY), Disp: 90 capsule, Rfl: 3 .  hydrochlorothiazide (MICROZIDE) 12.5 MG capsule, TAKE 1 CAPSULE BY MOUTH ONCE DAILY, Disp: 90 capsule, Rfl: 0 .  ipratropium (ATROVENT) 0.06 % nasal spray, Place 2 sprays into the nose 4 (four) times daily., Disp: , Rfl: 5 .  ketoconazole (NIZORAL) 2 % shampoo, APPLY TO AFFECTED AREA TWICE A WEEK AS DIRECTED, Disp: 120 mL, Rfl: 1 .  KLOR-CON M20 20 MEQ tablet, TAKE 1 TABLET BY MOUTH EVERY DAY, Disp: 90 tablet, Rfl: 3 .  latanoprost (XALATAN) 0.005 % ophthalmic solution, Place 1 drop into both eyes at bedtime. , Disp: , Rfl:  .  loratadine (CLARITIN) 10 MG tablet, Take 10 mg by mouth daily., Disp: , Rfl:  .  metFORMIN (GLUCOPHAGE) 500 MG tablet, TAKE 1 TABLET BY MOUTH TWICE A DAY WITH A MEAL, Disp: 180 tablet, Rfl: 1 .  nystatin (MYCOSTATIN/NYSTOP) 100000 UNIT/GM POWD, Reported on 03/02/2015, Disp: , Rfl:  .  omeprazole (PRILOSEC) 20 MG capsule, TAKE 1 CAPSULE (20 MG TOTAL) BY MOUTH 2 (TWO) TIMES DAILY., Disp: 180 capsule,  Rfl: 3 .  pravastatin (PRAVACHOL) 40 MG tablet, TAKE 1 TABLET BY MOUTH EVERY EVENING (Patient taking differently: TAKE 40 MG BY MOUTH DAILY), Disp: 90 tablet, Rfl: 2 .  triamcinolone cream (KENALOG) 0.1 %, Apply 1 application topically 2 (two) times daily as needed., Disp: 30 g, Rfl: 0 .  [DISCONTINUED] Calcium Carbonate-Vitamin D (CALCIUM-VITAMIN D) 500-200 MG-UNIT per tablet, Take 1 tablet by mouth 2 (two) times daily with a meal.  , Disp: , Rfl:  .  [DISCONTINUED] diltiazem (TIAZAC) 240 MG 24 hr capsule, Take 1 capsule (240 mg total) by mouth daily., Disp: 90 capsule, Rfl: 0 .  [DISCONTINUED] fexofenadine (ALLEGRA) 180 MG tablet, Take 1 tablet (180 mg total) by mouth daily., Disp: 15 tablet, Rfl: 0  EXAM:  Filed Vitals:   03/05/15 1018  BP: 110/80  Pulse: 103  Temp: 97.3 F (36.3 C)    Body mass index is 35.66 kg/(m^2).  GENERAL: vitals reviewed and listed above, alert, oriented, appears well hydrated and in no acute distress  HEENT: atraumatic, conjunttiva clear, no obvious abnormalities on inspection of external nose and ears  SKIN: mild xerosis of skin throughout, smale erythematous excoriated patch L shin - no pustule, mass, induration or warmth  MS: moves all extremities without noticeable abnormality  PSYCH: pleasant and cooperative, no obvious depression or anxiety  ASSESSMENT AND PLAN:  Discussed the following assessment and plan:  Eczema  -Patient advised to return or notify a doctor immediately if symptoms worsen or persist or new concerns arise.  Patient Instructions  Mix a small amount of the antibiotic ointment I gave you with hydrocortisone cream and apply to the rash two times every day for 1-2 weeks.  Use the cerave daily on dry skin and to prevent dry skin  Follow up if rash persists after treatment.     [name] R.

## 2015-03-05 NOTE — Patient Instructions (Signed)
Mix a small amount of the antibiotic ointment I gave you with hydrocortisone cream and apply to the rash two times every day for 1-2 weeks.  Use the cerave daily on dry skin and to prevent dry skin  Follow up if rash persists after treatment.

## 2015-03-05 NOTE — Telephone Encounter (Signed)
Spoke with patient. Patient was seen in the office on 02/22/2015 and treated for yeast with Diflucan. She took Diflucan 150 mg on 02/22/2015 and 02/25/2015. She was seen for a follow up appointment on 03/02/2015. Affirm was taken which was negative. She reports that her vaginal itching returned today. Itching is external. Denies any vaginal discharge. Patient is requesting further recommendations. She was advised at her appointment not to use any cream to the vulva or groin. States she has not used any cream. Denies any change in soap or detergent. Advised I will speak with the covering provider and return call with additional recommendations. She is agreeable.  Routing to Dr.Silva as [name] and Kem Boroughs, FNP are out of the office today.

## 2015-03-05 NOTE — Telephone Encounter (Signed)
[facility] Night - Client [facility] Call Center Patient Name: Linda Thompson Gender: Female DOB: 02/06/1955 Age: 60 Y 53 M 29 D Return Phone Number: [phone] (Primary) Address: [address] City/State/Zip: [name] [address] Client Linda Thompson Night - Client Client Site Linda Thompson [facility] Thompson - Night Physician [name] Contact Type Call Who Is Calling Patient / Member / Family / Caregiver Call Type Triage / Clinical Relationship To Patient Self Return Phone Number [phone] (Primary) Chief Complaint Itching Reason for Call Symptomatic / Request for [name] states she she has dry skin- got cream to put on it- has been scratching- still red- PreDisposition Call Pharmacist Translation No Nurse Assessment Nurse: Justine Null, RN, Rodena Piety Date/Time (Eastern Time): 03/03/2015 3:08:37 PM Confirm and document reason for call. If symptomatic, describe symptoms. You must click the next button to save text entered. ---Caller states she she has dry skin- got cream to put on it- has been scratching- still red- caller and has the skin on one area on her leg and has been only one spot and has been in to see the MD cerace and ordered and has been using this and has been scratching on the leg left and has an area about 1 " by 1" and has some redness and has no drainage and has no fevers Caller stated that she has been to the MD and had Diflucan pill and has been yeast infection Has the patient traveled out of the country within the last 30 days? ---No Does the patient have any new or worsening symptoms? ---Yes Will a triage be completed? ---Yes Related visit to physician within the last 2 weeks? ---Yes Does the PT have any chronic conditions? (i.e. diabetes, asthma, etc.) ---Yes List chronic conditions. ---diabetes HTN arthritis hiatal hernia Is this a behavioral health  or substance abuse call? ---No Guidelines Guideline Title Affirmed Question Affirmed Notes Nurse Date/Time (Eastern Time) Itching - Localized [1] Cause unknown AND [2] present > 7 days Justine Null, RNRodena Piety 03/03/2015 3:13:56 PM Disp. Time Eilene Ghazi Time) Disposition Final User PLEASE NOTE: All timestamps contained within this report are represented as [place] Standard Time. CONFIDENTIALTY NOTICE: This fax transmission is intended only for the addressee. It contains information that is legally privileged, confidential or otherwise protected from use or disclosure. If you are not the intended recipient, you are strictly prohibited from reviewing, disclosing, copying using or disseminating any of this information or taking any action in reliance on or regarding this information. If you have received this fax in error, please notify us immediately by telephone so that we can arrange for its return to [place]. Phone: [phone], Toll-Free: [phone], Fax: [phone] Page: 2 of 2 Call Id: [id number] 03/03/2015 3:16:14 PM See PCP When Office is Open (within 3 days) Yes Justine Null, RN, Inis Sizer Understands: Yes Disagree/Comply: Comply Thompson Advice Given Per Guideline SEE PCP WITHIN 3 DAYS: * You need to be seen within 2 or 3 days. Call your doctor during regular office hours and make an appointment. An urgent Thompson center is often the best source of Thompson if your doctor's office is closed or you can't get an appointment. NOTE: If office will be open tomorrow, tell caller to call then, not in 3 days. DON'T SCRATCH: * Cut the fingernails short. (Reason: prevent secondary bacterial infection.) * Itching is often worsened by scratching (the 'Itch-Scratch' cycle). * Try not to scratch. HYDROCORTISONE CREAM FOR ITCHING: * Apply hydrocortisone cream  4 times a day. Use it for a couple days, until the itch is mild. * Available OTC in [place] as 0.5% and 1% cream. CALL BACK IF: * You become worse. Thompson ADVICE given  per Itching, Localized (Adult) guideline.

## 2015-03-05 NOTE — Progress Notes (Signed)
Pre visit review using our clinic review tool, if applicable. No additional management support is needed unless otherwise documented below in the visit note. 

## 2015-03-05 NOTE — Telephone Encounter (Signed)
Patient is still itching and would like to speak with the nurse.

## 2015-03-05 NOTE — Telephone Encounter (Signed)
Linda Thompson make a recommendation for the patient to return for evaluation if symptoms occurred. Please schedule appointment.

## 2015-03-06 NOTE — Telephone Encounter (Signed)
agree

## 2015-03-06 NOTE — Telephone Encounter (Signed)
Spoke with with patient. Patient states that she woke up today and is still having vaginal itching. Advised I spoke with [name] this morning and affirm testing for yeast and bv was negative. Advised per [name] she should not use any cream to the vaginal area. Advised she may use nystatin powder externally to the vagina for 2 days. Advised this should relieve itching she is experiencing. The patient is able to repeat back instructions and is aware not to apply any cream to the vaginal area.   [name] do you agree with recommendations?

## 2015-03-08 ENCOUNTER — Telehealth: Payer: Self-pay | Admitting: Certified Nurse Midwife

## 2015-03-08 NOTE — Telephone Encounter (Signed)
Spoke with patient. Patient states that she used nystatin powder as directed externally to her vagina for itching x 2 days. States she feels like she is going to "break out" in between her thigh and her vagina. Reports the area between her thigh and her vagina is "sore" and feels like "it is going to sweat." She denies any rash to the area. Reports she is keeping the area dry. Wears depends when she goes out for the day, but reports she has been changing depends when they get wet. States she does not wear depends when she is at home. Asking if she needs to use nystatin powder to the area. Advised not to use any cream or powder to the area. Advised I will speak with [name] and return call with further recommendations. She is agreeable.

## 2015-03-08 NOTE — Telephone Encounter (Signed)
Patient says she is breaking out and wanting to know if she should still use the powder that is prescribed. Best # to reach: [phone]

## 2015-03-08 NOTE — Telephone Encounter (Signed)
Spoke with patient. Advised of message as seen below from [facility]. She is agreeable and verbalizes understanding.  Routing to provider for final review. Patient agreeable to disposition. Will close encounter.

## 2015-03-08 NOTE — Telephone Encounter (Signed)
Very light dusting to area only, no creams. She can do this if she feels she is going to sweat only.

## 2015-03-08 NOTE — Progress Notes (Signed)
Encounter reviewed Linda Southgate, MD   

## 2015-03-09 ENCOUNTER — Telehealth: Payer: Self-pay | Admitting: Certified Nurse Midwife

## 2015-03-09 NOTE — Telephone Encounter (Signed)
Patient is asking to talk with Regina Eck nurse regarding some "itching that she is having".

## 2015-03-09 NOTE — Telephone Encounter (Signed)
Spoke with patient. Patient states that she is experiencing intermittent vaginal itching. "I am itching on the inside." Denies placing any powder or cream vaginally. She is asking if this could be related to her "nerves." Denies any vaginal discharge or vaginal redness. Reports she is keeping her vaginal area dry. Wears depends when she goes out of the house, but states she changes these if they get wet. Wore depends yesterday and today. Advised these could be causing her to have some vaginal irritation. She is not currently wearing depends. Advised not to wear depends while at home if she can and to keep her vaginal area clean and dry. She is requesting additional recommendations on what to do for internal vaginal itching. Advised I will speak with [name] and return call with additional recommendations. She is agreeable.

## 2015-03-09 NOTE — Telephone Encounter (Signed)
See note of conversation with patient

## 2015-03-09 NOTE — Telephone Encounter (Signed)
Phone call to patient regarding "might feel that itching may start in vaginal area. I am worried it might happen again" Denies cream use in vaginal or groin area or powder. Patient only has this problem with depends use.  Denies any odor or redness or discharge in vaginal area. Discussed with patient that Depends may be causing the irritated feeling due to being a paper product. If stops when she takes them off, she should try another brand and limit the time she wears them. Patient repeated back conversation x 3 and feels better now. Will call only if has change.

## 2015-03-12 ENCOUNTER — Telehealth: Payer: Self-pay | Admitting: Certified Nurse Midwife

## 2015-03-12 NOTE — Telephone Encounter (Signed)
Patient is asking to talk with Regina Eck nurse. Patient is still having some itching after using "powder". Last seen 03/02/15.

## 2015-03-12 NOTE — Telephone Encounter (Signed)
Patient called back. She would like to cancel appointment after speaking with her neighbor. Her neighbor/POA has advised her to try yogurt to help with skin itching symptoms as she has had multiple office visits. Advised patient I am aware that she has had multiple office visits for this concern and that next step is to see an MD for evaluation.  Patient is a type two diabetic and states she takes Metformin BID. Sugars are controlled per patient. Last A1C one Month ago 6.7.   Advised patient yogurt may not be the best option as they may be sugary advised her to ensure to use light yogurt with active cultures.   Discussed with Patient Florajen probiotic capsules, may be better option. Called [facility] they do not have Florajen in stock but it can be ordered for her.  18 minute conversation with patient regarding instructions in skin care and symptoms. Patient verbalizes understanding and feels she is follow instructions as given to keep skin clean and dry and only use Nystatin as needed.   Patient states she will call back with any further symptoms or when she is able to obtain transportation with [place].   Routing to Dr. Sabra Heck as [name] is not in office today

## 2015-03-12 NOTE — Telephone Encounter (Signed)
Spoke with patient. She states she has stopped using all creams as instructed and has been using Nystatin Powder as directed. She feels that areas in her groin are becoming red and irritated. She has decreased depends usage as instructed.  She was seen by PCP previously and treated for a rash with hydrocortisone.  visits to this office and telephone messages. Patient requesting office visit. Advised will need to come in to see an MD for evaluation.

## 2015-03-13 ENCOUNTER — Ambulatory Visit: Payer: Medicare Other | Admitting: Obstetrics and Gynecology

## 2015-03-14 ENCOUNTER — Telehealth: Payer: Self-pay | Admitting: Certified Nurse Midwife

## 2015-03-14 DIAGNOSIS — R0602 Shortness of breath: Secondary | ICD-10-CM | POA: Diagnosis not present

## 2015-03-14 DIAGNOSIS — D869 Sarcoidosis, unspecified: Secondary | ICD-10-CM | POA: Diagnosis not present

## 2015-03-14 NOTE — Telephone Encounter (Signed)
Spoke with patient. Patient states that she has a rash between her legs and at the top of her abdomen. Denies any vaginal irritation or itching. Patient called in to the office Monday 03/12/2015 to discuss skin itching. Was advised to start taking OTC Florajen probiotics and to eat light yogurt with active cultures. Reports she has been taking Florajen and eating light yogurt daily. She is also applying Nystatin powder to the rash 3 times per day. States she is not sure if the rash is getting better. Patient is a diabetic who takes Metformin BID. Reports last A1C was performed 1 month ago and was 6.7. Patient wears depends when she goes out due to incontinence episodes. States she is cleaning well and keeping her skin dry. Removes her depends as soon as she gets home. Asked the patient if she feels the rash is where her depends touch her skin. The patient reports "Yes I guess so." Advised depends may be causing her to have skin irritation. Advised a new brand of depends may help her irritation. She is agreeable and will go to the store tomorrow to look for new brand of depends with softer material. She will continue to take her probiotics daily, keep her skin dry and clean, use Nystatin as needed, and only wear depends as needed. Advised if rash continues she will need to be seen in our office or with her PCP. Advised I will speak with [name] and if she has additional recommendations I will return call. She is agreeable.

## 2015-03-14 NOTE — Telephone Encounter (Signed)
Patient is asking to talk with Regina Eck nurse regarding the prescription she is taking.

## 2015-03-15 ENCOUNTER — Telehealth: Payer: Self-pay | Admitting: Family Medicine

## 2015-03-15 NOTE — Telephone Encounter (Signed)
Janett Billow from [facility] called stated that patient has already been seen and has an appointment Mar 7th @ 3:45.

## 2015-03-15 NOTE — Telephone Encounter (Signed)
Spoke with patient. Patient states that she went to the drugstore yesterday and bought a new brand of depends. She began wearing them this morning and states they are more comfortable than her previous brand. States that her rash is still present in between her legs and on her abdomen. Advised it will take a couple of days for the rash to start to go away. Advised she will need to continue wearing the new depends only, use Nystatin powder as needed, keep her skin clean and dry, and continue taking Florajen daily. She is agreeable and repeats this back to me x3. She is aware if her rash continues with these changes she will need to be seen for further evaluation. She is agreeable and verbalizes understanding. 23 minute phone conversation with patient.  Routing to provider for final review. Patient agreeable to disposition. Will close encounter.

## 2015-03-15 NOTE — Telephone Encounter (Signed)
Patient calling wanting to speak with nurse. Best # to reach: [phone]

## 2015-03-17 ENCOUNTER — Telehealth: Payer: Self-pay | Admitting: Family Medicine

## 2015-03-19 NOTE — Telephone Encounter (Signed)
I called the pt and informed her of the message below and she agreed. 

## 2015-03-19 NOTE — Telephone Encounter (Signed)
Would give the treatment some time. Would advise that she follow up with the doctor she is seen for this if persists.

## 2015-03-19 NOTE — Telephone Encounter (Signed)
[facility] Night - Client [facility] Patient Name: ISSIS HASSING Gender: Female DOB: May 28, 1955 Age: 60 Y 95 M 13 D Return Phone Number: [phone] (Primary) Address: [address] City/State/Zip: [name] [address] Client [facility] Night - Client Client Site [facility] - Night Physician [name] Contact Type Call Who Is Calling Patient / Member / Family / Caregiver Call Type Triage / Clinical Relationship To Patient Self Return Phone Number [phone] (Primary) Chief Complaint Unclassified Symptom Reason for Call Symptomatic / Request for [place] states that she has an infection. PreDisposition Call Doctor Translation No Nurse Assessment Nurse: Einar Gip, RN, Neoma Laming Date/Time Eilene Ghazi Time): 03/17/2015 10:07:58 PM Confirm and document reason for call. If symptomatic, describe symptoms. You must click the next button to save text entered. ---Caller states she had a yeast infection and the doctor sent her to a specialist. States she as been being treated for yeast infection with a powder and a pink pill. States then the doctor said everything was cleared up. States she has to wear depends secondary to her bowel problem. States she has been eating yogurt every day. States she has a genital rash still and has been using powder but she still has the rash. States she spoke with the nurse on Thursday and told her that she had been using the powder and depends. Has the patient traveled out of the country within the last 30 days? ---Not Applicable Does the patient have any new or worsening symptoms? ---Yes Will a triage be completed? ---Yes Related visit to physician within the last 2 weeks? ---Yes Does the PT have any chronic conditions? (i.e. diabetes, asthma, etc.) ---Yes List chronic conditions. ---diabetic Is this a behavioral  health or substance abuse call? ---No Guidelines Guideline Title Affirmed Question Affirmed Notes Nurse Date/Time (Eastern Time) Rash or Redness - Localized Genital area rash Wandalee Ferdinand 03/17/2015 10:14:25 PM Disp. Time Eilene Ghazi Time) Disposition Final User PLEASE NOTE: All timestamps contained within this report are represented as [place] Standard Time. CONFIDENTIALTY NOTICE: This fax transmission is intended only for the addressee. It contains information that is legally privileged, confidential or otherwise protected from use or disclosure. If you are not the intended recipient, you are strictly prohibited from reviewing, disclosing, copying using or disseminating any of this information or taking any action in reliance on or regarding this information. If you have received this fax in error, please notify us immediately by telephone so that we can arrange for its return to [place]. Phone: [phone], Toll-Free: [phone], Fax: [phone] Page: 2 of 2 Call Id: [id number] 03/17/2015 10:16:11 PM See Physician within 24 Hours Yes Daoud, RN, Garrel Ridgel Understands: Yes Disagree/Comply: Disagree Disagree/Comply Reason: Disagree with instructions Care Advice Given Per Guideline SEE PHYSICIAN WITHIN 24 HOURS: GENITAL HYGIENE: * Keep your genital area clean. Wash daily with unscented soap (e.g., Dial) and water. CALL BACK IF: * Fever occurs * You become worse. CARE ADVICE given per Rash - Localized and Cause Unknown (Adult) guideline. Comments User: Ernie Avena, RN Date/Time (Eastern Time): 03/17/2015 10:17:18 PM Caller does not have internet access and states she is not going to be seen within 24 hours but will call her doctor on Monday. Referrals GO TO FACILITY REFUSED

## 2015-03-21 ENCOUNTER — Telehealth: Payer: Self-pay | Admitting: Certified Nurse Midwife

## 2015-03-21 NOTE — Telephone Encounter (Signed)
Spoke with patient. She states that her external groin redness and irritation is much improved after changing brand of Depends. She is now c/o intermittent vaginal itching for two days. Denies vaginal discharge. Advised patient to continue previous instructions, to keep skin clean and dry. Only use Nystatin externally. Patient declines office visit at this time as her neighbor brings her to appointments and she does not have a ride at this time, reports having multiple other appointments for next week with GI and [place] patient to try to not itch skin and avoid it as much as possible so that area does not become increasingly irritated. Can use cool compresses as needed. Air dry after showers, continue probiotic.  Advised patient if itching becomes worse or develops return of external symptoms or vaginal discharge to please call back for office visit at her convenience.  26 minutes on phone with patient to give skin care advice, follow up advise and confirm appointments/locations and phone numbers for patient.  She also thinks she may need to reschedule her next annual exam for next year, but is not sure. Advised patient to please call back and we will be happy to reschedule when she is aware of plans for appointment.   Patient verbalized understanding of instructions and was able to repeat back instructions and when to call back for follow up.  Routing to [employer] CNM.

## 2015-03-21 NOTE — Telephone Encounter (Signed)
Patient says she is using the powder that Ms. Debbie prescribed but is now having internal itching. Best # to reach: [phone]

## 2015-03-21 NOTE — Telephone Encounter (Signed)
agree

## 2015-03-22 ENCOUNTER — Ambulatory Visit (INDEPENDENT_AMBULATORY_CARE_PROVIDER_SITE_OTHER): Payer: Medicare Other | Admitting: Obstetrics and Gynecology

## 2015-03-22 ENCOUNTER — Telehealth: Payer: Self-pay | Admitting: Certified Nurse Midwife

## 2015-03-22 ENCOUNTER — Encounter: Payer: Self-pay | Admitting: Obstetrics and Gynecology

## 2015-03-22 DIAGNOSIS — N760 Acute vaginitis: Secondary | ICD-10-CM

## 2015-03-22 MED ORDER — FLUCONAZOLE 150 MG PO TABS: ORAL_TABLET | ORAL | Status: DC

## 2015-03-22 NOTE — Telephone Encounter (Signed)
Patient is having some vaginal itching and would like to speak with the nurse.

## 2015-03-22 NOTE — Telephone Encounter (Signed)
Patient here for office visit.  Will close encounter.

## 2015-03-22 NOTE — Progress Notes (Signed)
Patient ID: Linda Thompson, female   DOB: 07/24/1955, 60 y.o.   MRN: [id number] GYNECOLOGY  VISIT   HPI: 60 y.o.   Single  Caucasian  female   [name] with Patient's last menstrual period was 01/21/1992 (approximate).   here for vaginal itching.  Patient has been using Nystatin powder externally but she feels irritation is now also internally.  She recently began Florajen probiotic once daily to see if this would help.    Denies vaginal discharge and odor.   Has chronic vaginitis.  Has used Terazol and Diflucan previously.   Affirm testing negative on 03/02/15.   Patient was told to stop the use of creams externally.  Told not to use baby wipes either.   Using Depends for fecal incontinence.  Has irritable bowel syndrome. Encouraged to try a new type of Depends.    Using a hair dryer to keep the area dry.   Patient has diabetes.   GYNECOLOGIC HISTORY: Patient's last menstrual period was 01/21/1992 (approximate). Contraception:Hysterectomy Menopausal hormone therapy: None Last mammogram: 09-06-14 3D/D[facility] Cat.A/Neg/BiRads1:[facility] Last pap smear: 01-16-14 Neg        OB History    Gravida Para Term Preterm AB TAB SAB Ectopic Multiple Living   0 0 0 0 0 0 0 0 0 0          Patient Active Problem List   Diagnosis Date Noted  . Sarcoidosis ([place]) 10/27/2014  . Obesity 05/05/2011  . Irritable bowel syndrome 03/05/2010  . Diabetes ([place]) 08/13/2007  . ALLERGIC  RHINITIS 09/23/2006  . Hyperlipemia 03/11/2006  . MENTAL RETARDATION, MODERATE 03/11/2006  . GLAUCOMA NOS 03/11/2006  . Essential hypertension 03/11/2006  . GERD 03/11/2006  . MENINGIOMA 12/20/2001    Past Medical History  Diagnosis Date  . Type II or unspecified type diabetes mellitus without mention of complication, not stated as uncontrolled   . Hypertension   . Hyperlipemia   . GERD (gastroesophageal reflux disease)     w/ hx diaphragmatic hernia  . Sarcoidosis ([place])     difuse bony lesions and LDA, dx by  biopsy in 2016  . Osteoarthrosis, unspecified whether generalized or localized, ankle and foot   . DDD (degenerative disc disease), lumbar   . Glaucoma   . IBS (irritable bowel syndrome)   . Meningioma ([place])     ant falx, stable on prior imaging  . Moderate intellectual disabilities   . Transaminitis   . Obesity   . Chronic low back pain   . Allergic rhinitis   . Hx of fracture     R arm, foot  . Heart murmur   . Low back pain 11/22/2012  . GLAUCOMA NOS 03/11/2006    Qualifier: Diagnosis of  By: Linda Browner MD, Linda      Past Surgical History  Procedure Laterality Date  . Total abdominal hysterectomy  03/05/1992    leiomyoma and cellular leiomyoma  . Cataract extraction    . Elbow surgery      right elbow  . Lipoma excision Left [id number]    posterior left axilla    Current Outpatient Prescriptions  Medication Sig Dispense Refill  . acetaminophen (TYLENOL) 500 MG tablet Take 500 mg by mouth as needed (left ear pain).    Marland Kitchen aspirin EC 81 MG tablet Take 81 mg by mouth daily.    Marland Kitchen diltiazem (CARDIZEM CD) 240 MG 24 hr capsule TAKE ONE CAPSULE BY MOUTH EVERY DAY (Patient taking differently: TAKE 240 MG BY MOUTH DAILY) 90 capsule 3  .  hydrochlorothiazide (MICROZIDE) 12.5 MG capsule TAKE 1 CAPSULE BY MOUTH ONCE DAILY 90 capsule 0  . ipratropium (ATROVENT) 0.06 % nasal spray Place 2 sprays into the nose 4 (four) times daily.  5  . ketoconazole (NIZORAL) 2 % shampoo APPLY TO AFFECTED AREA TWICE A WEEK AS DIRECTED 120 mL 1  . KLOR-CON M20 20 MEQ tablet TAKE 1 TABLET BY MOUTH EVERY DAY 90 tablet 3  . Lactobacillus (FLORAJEN ACIDOPHILUS PO) Take 1 tablet by mouth daily.    Marland Kitchen latanoprost (XALATAN) 0.005 % ophthalmic solution Place 1 drop into both eyes at bedtime.     Marland Kitchen loratadine (CLARITIN) 10 MG tablet Take 10 mg by mouth daily.    . metFORMIN (GLUCOPHAGE) 500 MG tablet TAKE 1 TABLET BY MOUTH TWICE A DAY WITH A MEAL 180 tablet 1  . nystatin (MYCOSTATIN/NYSTOP) 100000 UNIT/GM POWD Reported on  03/02/2015    . omeprazole (PRILOSEC) 20 MG capsule TAKE 1 CAPSULE (20 MG TOTAL) BY MOUTH 2 (TWO) TIMES DAILY. 180 capsule 3  . pravastatin (PRAVACHOL) 40 MG tablet TAKE 1 TABLET BY MOUTH EVERY EVENING (Patient taking differently: TAKE 40 MG BY MOUTH DAILY) 90 tablet 2  . triamcinolone cream (KENALOG) 0.1 % Apply 1 application topically 2 (two) times daily as needed. 30 g 0  . [DISCONTINUED] Calcium Carbonate-Vitamin D (CALCIUM-VITAMIN D) 500-200 MG-UNIT per tablet Take 1 tablet by mouth 2 (two) times daily with a meal.      . [DISCONTINUED] diltiazem (TIAZAC) 240 MG 24 hr capsule Take 1 capsule (240 mg total) by mouth daily. 90 capsule 0  . [DISCONTINUED] fexofenadine (ALLEGRA) 180 MG tablet Take 1 tablet (180 mg total) by mouth daily. 15 tablet 0   No current facility-administered medications for this visit.     ALLERGIES: Ace inhibitors; Amoxicillin-pot clavulanate; Augmentin; Azithromycin; Ceftin; Cefuroxime; Cyclobenzaprine hcl; Flexeril; Flonase; and Ibuprofen  Family History  Problem Relation Age of Onset  . Diabetes Mother   . Stroke Mother   . Hypertension Mother   . Diabetes Maternal Aunt   . Heart disease Maternal Aunt      (had pacemaker)  . Diabetes Maternal Grandmother   . Stroke Maternal Grandmother   . Hypertension Maternal Grandmother   . Stroke Father     Social History   Social History  . Marital Status: Single    Spouse Name: N/A  . Number of Children: 0  . Years of Education: N/A   Occupational History  . Disabled (from arm fracture)    Social History Main Topics  . Smoking status: Never Smoker   . Smokeless tobacco: Never Used  . Alcohol Use: No  . Drug Use: No  . Sexual Activity: Not Currently    Birth Control/ Protection: Surgical     Comment: Hyst--TAH--unsure if has ovaries   Other Topics Concern  . Not on file   Social History Narrative   Lives at [facility] community (on [address] in [place])       Linda Thompson for arm fracture      Mental Retardation, but is able to drive to places she is comfortable with and prepare her own meals and manage most of her own affairs.      Linda Thompson (neighbor) is POA/HCPOA      Reports she gets regular exercise and tries to eat healthy    ROS:  Pertinent items are noted in HPI.  PHYSICAL EXAMINATION:    BP 130/80 mmHg  Pulse 110  Resp 24  Ht  5\' 2"  (1.575 m)  Wt 195 lb 3.2 oz (88.542 kg)  BMI 35.69 kg/m2  LMP 01/21/1992 (Approximate)    General appearance: alert, cooperative and appears stated age    Pelvic: External genitalia:  Mid right labia majora with soft mass 2 cm, nontender.  Intertriginous areas with significant erythema bilaterally.  Odor noted.              Urethra:  normal appearing urethra with no masses, tenderness or lesions              Bartholins and Skenes: normal                 Vagina: normal appearing vagina with normal color and discharge, no lesions.  Atrophy noted.  Bleeds slightly with speculum exam and performing swab for yeast culture.              Cervix: absent           Bimanual Exam:  Uterus:  uterus absent              Adnexa: no mass, fullness, tenderness.  Exam limited by body habitus.            Chaperone was present for exam.  ASSESSMENT  Chronic vulvovaginitis. I suspect this is all candida. DM.  PLAN  Counseled regarding yeast vulvovaginitis.  Affirm and yeast vaginal culture done.  Ok to use probiotics daily.  Stop all creams and powders.  Use Diflucan 150 mg po q day for 7 days.  She indicates understanding of our visit and the treatment recommended. Follow up in 1 week for a recheck.    An After Visit Summary was printed and given to the patient.  __15____ minutes face to face time of which over 50% was spent in counseling.

## 2015-03-22 NOTE — Telephone Encounter (Signed)
Spoke with patient. She reports continued vaginal itching.   Advised patient that at this time, again, I recommend office visit with a physician as this has been ongoing for her.  Needs assessment for further treatment and to confirm problem.   Patient agreeable. She is given options for times and days for appointments. She will call back after speaking with her neighbor that drives her to appointments.

## 2015-03-22 NOTE — Telephone Encounter (Signed)
Patient returned call to [facility]. She will come in at 11:00 to see Dr Quincy Simmonds.

## 2015-03-23 ENCOUNTER — Telehealth: Payer: Self-pay

## 2015-03-23 ENCOUNTER — Telehealth: Payer: Self-pay | Admitting: Certified Nurse Midwife

## 2015-03-23 LAB — WET PREP BY MOLECULAR PROBE
CANDIDA SPECIES: NEGATIVE
GARDNERELLA VAGINALIS: NEGATIVE
Trichomonas vaginosis: NEGATIVE

## 2015-03-23 NOTE — Telephone Encounter (Signed)
-----   Message from Nunzio Cobbs, MD sent at 03/23/2015 10:01 AM EST ----- Please let patient know that her Affirm is negative for internal vaginal infection.  I am treating her for vulvar yeast infection with Diflucan 150 mg daily for one week.   Cc- Marisa Sprinkles

## 2015-03-23 NOTE — Telephone Encounter (Signed)
Spoke with patient. Advised of results as seen below from [place]. She is agreeable and verbalizes understanding.   Routing to provider for final review. Patient agreeable to disposition. Will close encounter.

## 2015-03-23 NOTE — Telephone Encounter (Signed)
Patient says she would like to speak with the nurse regarding her medication.

## 2015-03-23 NOTE — Telephone Encounter (Signed)
Spoke with patient. Patient was seen in the office yesterday 03/22/2015 for evaluation of yeast infection symptoms. She was prescribed Diflucan 150 mg take 1 tablet daily x 7 days. States she took her first Diflucan last night and a couple hours later had diarrhea. States she has not had diarrhea since. Denies any nausea, vomiting, or stomach pain. Patient has IBS. Has not had any diarrhea today. Advised her upset stomach could have been from her IBS. States she took another Diflucan this morning with Florajen tablet and yogurt. Has not had any stomach problems after her second Diflucan dose. Advised she will need to take the Diflucan at the same time daily so her doses are not so close together. She is agreeable. She will take her Florajen with yogurt in the morning and her Diflucan in the afternoon. Aware she will need to begin this tomorrow as she has already taken Diflucan today. She is agreeable. Advised if she develops any further bowel symptoms she will need to contact the office. She is agreeable. 13 minute phone call with patient.  Routing to provider for final review. Patient agreeable to disposition. Will close encounter.

## 2015-03-27 ENCOUNTER — Telehealth: Payer: Self-pay | Admitting: *Deleted

## 2015-03-27 DIAGNOSIS — R0602 Shortness of breath: Secondary | ICD-10-CM | POA: Diagnosis not present

## 2015-03-27 DIAGNOSIS — D869 Sarcoidosis, unspecified: Secondary | ICD-10-CM | POA: Diagnosis not present

## 2015-03-27 NOTE — Telephone Encounter (Signed)
Joy called from Dr Audelia Hives office stating the pt is there now and they would like the recent lab test results faxed to their office.  The results from 02/01/2015 was faxed to [id number].

## 2015-03-28 ENCOUNTER — Ambulatory Visit (INDEPENDENT_AMBULATORY_CARE_PROVIDER_SITE_OTHER): Payer: Medicare Other | Admitting: Gastroenterology

## 2015-03-28 ENCOUNTER — Other Ambulatory Visit (INDEPENDENT_AMBULATORY_CARE_PROVIDER_SITE_OTHER): Payer: Medicare Other

## 2015-03-28 ENCOUNTER — Encounter: Payer: Self-pay | Admitting: Gastroenterology

## 2015-03-28 VITALS — BP 116/80 | HR 80 | Ht 62.0 in | Wt 193.4 lb

## 2015-03-28 DIAGNOSIS — D509 Iron deficiency anemia, unspecified: Secondary | ICD-10-CM

## 2015-03-28 DIAGNOSIS — K219 Gastro-esophageal reflux disease without esophagitis: Secondary | ICD-10-CM | POA: Diagnosis not present

## 2015-03-28 LAB — CBC WITH DIFFERENTIAL/PLATELET
BASOS ABS: 0 10*3/uL (ref 0.0–0.1)
Basophils Relative: 0.7 % (ref 0.0–3.0)
EOS ABS: 0.2 10*3/uL (ref 0.0–0.7)
Eosinophils Relative: 3.2 % (ref 0.0–5.0)
HCT: 34.5 % — ABNORMAL LOW (ref 36.0–46.0)
Hemoglobin: 10.2 g/dL — ABNORMAL LOW (ref 12.0–15.0)
LYMPHS ABS: 1.3 10*3/uL (ref 0.7–4.0)
Lymphocytes Relative: 18.6 % (ref 12.0–46.0)
MCHC: 29.6 g/dL — ABNORMAL LOW (ref 30.0–36.0)
MCV: 66.2 fl — ABNORMAL LOW (ref 78.0–100.0)
MONO ABS: 0.7 10*3/uL (ref 0.1–1.0)
Monocytes Relative: 9.9 % (ref 3.0–12.0)
NEUTROS ABS: 4.8 10*3/uL (ref 1.4–7.7)
Neutrophils Relative %: 67.6 % (ref 43.0–77.0)
PLATELETS: 645 10*3/uL — AB (ref 150.0–400.0)
RBC: 5.21 Mil/uL — AB (ref 3.87–5.11)
RDW: 18.9 % — ABNORMAL HIGH (ref 11.5–15.5)
WBC: 7.1 10*3/uL (ref 4.0–10.5)

## 2015-03-28 LAB — IBC PANEL
IRON: 21 ug/dL — AB (ref 42–145)
Saturation Ratios: 3.6 % — ABNORMAL LOW (ref 20.0–50.0)
TRANSFERRIN: 412 mg/dL — AB (ref 212.0–360.0)

## 2015-03-28 LAB — FERRITIN: FERRITIN: 6.9 ng/mL — AB (ref 10.0–291.0)

## 2015-03-28 NOTE — Progress Notes (Signed)
HPI :  60 y/o female with a history of sarcoidosis, here for a diagnosis of microcytic anemia. New patient evaluation.   She denies any blood in the stools. No black stools She denies any diarrhea or constipation. She denies bowel habit changes. No abdominal pains. She eats well, no vomiting. No dysphagia at baseline. She denies any active reflux symptoms. No weight loss, stable weight. She feels well in general. She denies the use of any blood thinners. She takes prilosec twice daily which works well for her and prevent her GERD symptoms. She denies NSAID use. She takes a  Baby aspirin daily.  She has had a colonoscopy in 2013 which was normal and an EGD in 2001 showing a hiatal hernia. She has been evaluated by oncology for enlarged lymph nodes and this was biopsied and consistent with sarcoidosis. She denies fatigue and generally has no complaints today. She is here with her friend [name] who accompanies her to her medical appointments.   Colonoscopy 3/13 - normal EGD 01/1999 - hiatal hernia, mild gastritis - no pathology available  Past Medical History  Diagnosis Date  . Type II or unspecified type diabetes mellitus without mention of complication, not stated as uncontrolled   . Hypertension   . Hyperlipemia   . GERD (gastroesophageal reflux disease)     w/ hx diaphragmatic hernia  . Sarcoidosis ([place])     difuse bony lesions and LDA, dx by biopsy in 2016  . Osteoarthrosis, unspecified whether generalized or localized, ankle and foot   . DDD (degenerative disc disease), lumbar   . Glaucoma   . IBS (irritable bowel syndrome)   . Meningioma ([place])     ant falx, stable on prior imaging  . Moderate intellectual disabilities   . Transaminitis   . Obesity   . Chronic low back pain   . Allergic rhinitis   . Hx of fracture     R arm, foot  . Heart murmur   . Low back pain 11/22/2012  . GLAUCOMA NOS 03/11/2006    Qualifier: Diagnosis of  By: Diona Browner MD, Amy       Past Surgical History    Procedure Laterality Date  . Total abdominal hysterectomy  03/05/1992    leiomyoma and cellular leiomyoma  . Cataract extraction    . Elbow surgery      right elbow  . Lipoma excision Left [id number]    posterior left axilla   Family History  Problem Relation Age of Onset  . Diabetes Mother   . Stroke Mother   . Hypertension Mother   . Diabetes Maternal Aunt   . Heart disease Maternal Aunt      (had pacemaker)  . Diabetes Maternal Grandmother   . Stroke Maternal Grandmother   . Hypertension Maternal Grandmother   . Stroke Father    Social History  Substance Use Topics  . Smoking status: Never Smoker   . Smokeless tobacco: Never Used  . Alcohol Use: No   Current Outpatient Prescriptions  Medication Sig Dispense Refill  . acetaminophen (TYLENOL) 500 MG tablet Take 500 mg by mouth as needed (left ear pain).    Marland Kitchen aspirin EC 81 MG tablet Take 81 mg by mouth daily.    Marland Kitchen diltiazem (CARDIZEM CD) 240 MG 24 hr capsule TAKE ONE CAPSULE BY MOUTH EVERY DAY (Patient taking differently: TAKE 240 MG BY MOUTH DAILY) 90 capsule 3  . fluconazole (DIFLUCAN) 150 MG tablet Take one tablet (150 mg) by mouth daily for  one week. 7 tablet 0  . hydrochlorothiazide (MICROZIDE) 12.5 MG capsule TAKE 1 CAPSULE BY MOUTH ONCE DAILY 90 capsule 0  . ipratropium (ATROVENT) 0.06 % nasal spray Place 2 sprays into the nose 4 (four) times daily.  5  . ketoconazole (NIZORAL) 2 % shampoo APPLY TO AFFECTED AREA TWICE A WEEK AS DIRECTED 120 mL 1  . KLOR-CON M20 20 MEQ tablet TAKE 1 TABLET BY MOUTH EVERY DAY 90 tablet 3  . Lactobacillus (FLORAJEN ACIDOPHILUS PO) Take 1 tablet by mouth daily.    Marland Kitchen latanoprost (XALATAN) 0.005 % ophthalmic solution Place 1 drop into both eyes at bedtime.     Marland Kitchen loratadine (CLARITIN) 10 MG tablet Take 10 mg by mouth daily.    . metFORMIN (GLUCOPHAGE) 500 MG tablet TAKE 1 TABLET BY MOUTH TWICE A DAY WITH A MEAL 180 tablet 1  . nystatin (MYCOSTATIN/NYSTOP) 100000 UNIT/GM POWD Reported on  03/02/2015    . omeprazole (PRILOSEC) 20 MG capsule TAKE 1 CAPSULE (20 MG TOTAL) BY MOUTH 2 (TWO) TIMES DAILY. 180 capsule 3  . pravastatin (PRAVACHOL) 40 MG tablet TAKE 1 TABLET BY MOUTH EVERY EVENING (Patient taking differently: TAKE 40 MG BY MOUTH DAILY) 90 tablet 2  . triamcinolone cream (KENALOG) 0.1 % Apply 1 application topically 2 (two) times daily as needed. 30 g 0  . [DISCONTINUED] Calcium Carbonate-Vitamin D (CALCIUM-VITAMIN D) 500-200 MG-UNIT per tablet Take 1 tablet by mouth 2 (two) times daily with a meal.      . [DISCONTINUED] diltiazem (TIAZAC) 240 MG 24 hr capsule Take 1 capsule (240 mg total) by mouth daily. 90 capsule 0  . [DISCONTINUED] fexofenadine (ALLEGRA) 180 MG tablet Take 1 tablet (180 mg total) by mouth daily. 15 tablet 0   No current facility-administered medications for this visit.   Allergies  Allergen Reactions  . Ace Inhibitors Cough  . Amoxicillin-Pot Clavulanate Diarrhea  . Augmentin [Amoxicillin-Pot Clavulanate] Other (See Comments)    diarrhea  . Azithromycin Diarrhea  . Ceftin [Cefuroxime Axetil]     diarrhea  . Cefuroxime Diarrhea and Other (See Comments)    Generalized weakness  . Cyclobenzaprine Hcl Diarrhea  . Flexeril [Cyclobenzaprine] Diarrhea  . Flonase [Fluticasone Propionate] Other (See Comments)    Nose bleed  . Ibuprofen Other (See Comments)    REACTION: vomiting     Review of Systems: All systems reviewed and negative except where noted in HPI.   Lab Results  Component Value Date   WBC 9.2 02/01/2015   HGB 10.8* 02/01/2015   HCT 36.9 02/01/2015   MCV 66.8 Repeated and verified X2.* 02/01/2015   PLT 524.0* 02/01/2015   No results found for: IRON, TIBC, FERRITIN  Lab Results  Component Value Date   CREATININE 0.83 02/01/2015   BUN 19 02/01/2015   NA 141 02/01/2015   K 4.8 02/01/2015   CL 105 02/01/2015   CO2 27 02/01/2015     Physical Exam: BP 116/80 mmHg  Pulse 80  Ht 5\' 2"  (1.575 m)  Wt 193 lb 6.4 oz (87.726 kg)   BMI 35.36 kg/m2  LMP 01/21/1992 (Approximate) Constitutional: Pleasant,well-developed, female in no acute distress. HEENT: Normocephalic and atraumatic. Conjunctivae are normal. No scleral icterus. Neck supple.  Cardiovascular: Normal rate, regular rhythm.  Pulmonary/chest: Effort normal and breath sounds normal. No wheezing, rales or rhonchi. Abdominal: Soft, nondistended, protuberant, nontender. Bowel sounds active throughout. There are no masses palpable. No hepatomegaly. Extremities: no edema Lymphadenopathy: No cervical adenopathy noted. Neurological: Alert and oriented to person place  and time. Skin: Skin is warm and dry. No rashes noted. Psychiatric: Normal mood and affect. Behavior is normal.   ASSESSMENT AND PLAN: 60 y/o female with history of sarcoidosis and medical history as outlined above, presenting with a microcytic anemia which based on labs has been present since 2015 or so. Anemia is mild but with marked microcytosis. I don't see any iron studies that have been done to date. She has no GI symptoms of bleeding or bowel symptoms in general. At this time I will obtain a repeat CBC with iron studies. If she is iron deficient, which is very likely, I am recommending an EGD and colonoscopy to evaluate this issue initially. If these exams are unremarkable, then we would consider a capsule study. If however her iron studies are normal, I think the yield of an endoscopic evaluation is likely low and would refer her back to hematology to further workup.  I discussed the risks / benefits of endoscopy with her and her friend [name] at length and answered their questions. She is agreeable to proceed with endoscopy if needed. I will await lab results and we will contact her with the results and schedule if needed. Otherwise, GERD is very well controlled on her current regimen. Given the possible long term risks of PPI recommend the lowest dose needed to control symptoms. She may consider  decreasing PPI to once daily over time to see if this control symptoms, or use it PRN.  Point of contact for her friend [name] [phone], they said it is best to call her first to relay results.   [place] Cellar, MD [facility] Pager [phone]

## 2015-03-28 NOTE — Patient Instructions (Signed)
  Your physician has requested that you go to the basement for the following lab work before leaving today: IBC panel , CBC/diff, Ferritin    We will call you with results and plans.    I appreciate the opportunity to care for you.

## 2015-03-29 ENCOUNTER — Other Ambulatory Visit: Payer: Self-pay | Admitting: Family Medicine

## 2015-03-29 ENCOUNTER — Other Ambulatory Visit: Payer: Self-pay | Admitting: *Deleted

## 2015-03-29 MED ORDER — FERROUS SULFATE 325 (65 FE) MG PO TABS: 325.0000 mg | ORAL_TABLET | Freq: Two times a day (BID) | ORAL | Status: DC

## 2015-03-30 ENCOUNTER — Ambulatory Visit (INDEPENDENT_AMBULATORY_CARE_PROVIDER_SITE_OTHER): Payer: Medicare Other | Admitting: Obstetrics and Gynecology

## 2015-03-30 ENCOUNTER — Encounter: Payer: Self-pay | Admitting: Obstetrics and Gynecology

## 2015-03-30 ENCOUNTER — Ambulatory Visit: Payer: Medicare Other | Admitting: Obstetrics and Gynecology

## 2015-03-30 VITALS — BP 118/72 | HR 80 | Resp 18 | Ht 62.0 in | Wt 195.4 lb

## 2015-03-30 DIAGNOSIS — N760 Acute vaginitis: Secondary | ICD-10-CM | POA: Diagnosis not present

## 2015-03-30 MED ORDER — TERCONAZOLE 0.4 % VA CREA: 1.0000 | TOPICAL_CREAM | Freq: Every day | VAGINAL | Status: DC

## 2015-03-30 NOTE — Progress Notes (Signed)
GYNECOLOGY  VISIT   HPI: 60 y.o.   Single  Caucasian  female   [name] with Patient's last menstrual period was 01/21/1992 (approximate).   here for  Recheck for vaginitis. Pt states that she is still finished her medication on Wednesday and that she is still experiencing itchiness internally in her vagina and has also more recently started experiencing a slight burning sensation while urinating.  Not really dysuria.  Took Diflucan 150 gm daily for one week.  External vulva feeling better.  Still itching on the inside.   Has a prescription for Nystatin powder but it not using this.   States she has seen 2 doctors this week.  Has low ferritin and low Hgb.  Planning on colonoscopy and endoscopy.   Wears a pad due to fecal incontinence. Only uses this when she has to leave the house.  Hemoglobin A1C - 6.7 on 02/01/15.  GYNECOLOGIC HISTORY: Patient's last menstrual period was 01/21/1992 (approximate). Contraception:none Menopausal hormone therapy: none Last mammogram: 09-06-14 Category A Bi-Rads 1 Neg Last pap smear: 12/28/ 2015        OB History    Gravida Para Term Preterm AB TAB SAB Ectopic Multiple Living   0 0 0 0 0 0 0 0 0 0          Patient Active Problem List   Diagnosis Date Noted  . Sarcoidosis ([place]) 10/27/2014  . Obesity 05/05/2011  . Irritable bowel syndrome 03/05/2010  . Diabetes ([place]) 08/13/2007  . ALLERGIC  RHINITIS 09/23/2006  . Hyperlipemia 03/11/2006  . MENTAL RETARDATION, MODERATE 03/11/2006  . GLAUCOMA NOS 03/11/2006  . Essential hypertension 03/11/2006  . GERD 03/11/2006  . MENINGIOMA 12/20/2001    Past Medical History  Diagnosis Date  . Type II or unspecified type diabetes mellitus without mention of complication, not stated as uncontrolled   . Hypertension   . Hyperlipemia   . GERD (gastroesophageal reflux disease)     w/ hx diaphragmatic hernia  . Sarcoidosis ([place])     difuse bony lesions and LDA, dx by biopsy in 2016  . Osteoarthrosis,  unspecified whether generalized or localized, ankle and foot   . DDD (degenerative disc disease), lumbar   . Glaucoma   . IBS (irritable bowel syndrome)   . Meningioma ([place])     ant falx, stable on prior imaging  . Moderate intellectual disabilities   . Transaminitis   . Obesity   . Chronic low back pain   . Allergic rhinitis   . Hx of fracture     R arm, foot  . Heart murmur   . Low back pain 11/22/2012  . GLAUCOMA NOS 03/11/2006    Qualifier: Diagnosis of  By: Diona Browner MD, Amy      Past Surgical History  Procedure Laterality Date  . Total abdominal hysterectomy  03/05/1992    leiomyoma and cellular leiomyoma  . Cataract extraction    . Elbow surgery      right elbow  . Lipoma excision Left [id number]    posterior left axilla    Current Outpatient Prescriptions  Medication Sig Dispense Refill  . acetaminophen (TYLENOL) 500 MG tablet Take 500 mg by mouth as needed (left ear pain).    Marland Kitchen aspirin EC 81 MG tablet Take 81 mg by mouth daily.    Marland Kitchen diltiazem (CARDIZEM CD) 240 MG 24 hr capsule TAKE ONE CAPSULE BY MOUTH EVERY DAY (Patient taking differently: TAKE 240 MG BY MOUTH DAILY) 90 capsule 3  . ferrous sulfate 325 (  65 FE) MG tablet Take 1 tablet (325 mg total) by mouth 2 (two) times daily with a meal. 60 tablet 3  . fluconazole (DIFLUCAN) 150 MG tablet Take one tablet (150 mg) by mouth daily for one week. 7 tablet 0  . hydrochlorothiazide (MICROZIDE) 12.5 MG capsule TAKE 1 CAPSULE BY MOUTH ONCE DAILY 90 capsule 1  . ipratropium (ATROVENT) 0.06 % nasal spray Place 2 sprays into the nose 4 (four) times daily.  5  . ketoconazole (NIZORAL) 2 % shampoo APPLY TO AFFECTED AREA TWICE A WEEK AS DIRECTED 120 mL 1  . KLOR-CON M20 20 MEQ tablet TAKE 1 TABLET BY MOUTH EVERY DAY 90 tablet 3  . Lactobacillus (FLORAJEN ACIDOPHILUS PO) Take 1 tablet by mouth daily.    Marland Kitchen latanoprost (XALATAN) 0.005 % ophthalmic solution Place 1 drop into both eyes at bedtime.     Marland Kitchen loratadine (CLARITIN) 10 MG tablet  Take 10 mg by mouth daily.    . metFORMIN (GLUCOPHAGE) 500 MG tablet TAKE 1 TABLET BY MOUTH TWICE A DAY WITH A MEAL 180 tablet 1  . nystatin (MYCOSTATIN/NYSTOP) 100000 UNIT/GM POWD Reported on 03/02/2015    . omeprazole (PRILOSEC) 20 MG capsule TAKE 1 CAPSULE (20 MG TOTAL) BY MOUTH 2 (TWO) TIMES DAILY. 180 capsule 3  . pravastatin (PRAVACHOL) 40 MG tablet TAKE 1 TABLET BY MOUTH EVERY EVENING 90 tablet 1  . triamcinolone cream (KENALOG) 0.1 % Apply 1 application topically 2 (two) times daily as needed. 30 g 0  . [DISCONTINUED] Calcium Carbonate-Vitamin D (CALCIUM-VITAMIN D) 500-200 MG-UNIT per tablet Take 1 tablet by mouth 2 (two) times daily with a meal.      . [DISCONTINUED] diltiazem (TIAZAC) 240 MG 24 hr capsule Take 1 capsule (240 mg total) by mouth daily. 90 capsule 0  . [DISCONTINUED] fexofenadine (ALLEGRA) 180 MG tablet Take 1 tablet (180 mg total) by mouth daily. 15 tablet 0   No current facility-administered medications for this visit.     ALLERGIES: Ace inhibitors; Amoxicillin-pot clavulanate; Augmentin; Azithromycin; Ceftin; Cefuroxime; Cyclobenzaprine hcl; Flexeril; Flonase; and Ibuprofen  Family History  Problem Relation Age of Onset  . Diabetes Mother   . Stroke Mother   . Hypertension Mother   . Diabetes Maternal Aunt   . Heart disease Maternal Aunt      (had pacemaker)  . Diabetes Maternal Grandmother   . Stroke Maternal Grandmother   . Hypertension Maternal Grandmother   . Stroke Father     Social History   Social History  . Marital Status: Single    Spouse Name: N/A  . Number of Children: 0  . Years of Education: N/A   Occupational History  . Disabled (from arm fracture)    Social History Main Topics  . Smoking status: Never Smoker   . Smokeless tobacco: Never Used  . Alcohol Use: No  . Drug Use: No  . Sexual Activity: Not Currently    Birth Control/ Protection: Surgical     Comment: Hyst--TAH--unsure if has ovaries   Other Topics Concern  . Not on  file   Social History Narrative   Lives at [facility] (on [address] in [place])      [place] for arm fracture      Mental Retardation, but is able to drive to places she is comfortable with and prepare her own meals and manage most of her own affairs.      [name] (neighbor) is POA/HCPOA      Reports she  gets regular exercise and tries to eat healthy    ROS:  Pertinent items are noted in HPI.  PHYSICAL EXAMINATION:    BP 118/72 mmHg  Pulse 80  Resp 18  Ht 5\' 2"  (1.575 m)  Wt 195 lb 6.4 oz (88.633 kg)  BMI 35.73 kg/m2  LMP 01/21/1992 (Approximate)    General appearance: alert, cooperative and appears stated age   Pelvic: External genitalia: Generalized erythema of the intertriginous zones along with white creamy discharge on the right side, erythema of the bilateral labia.  Prominence of the right labia majora with 2 cm mass effect, nontender (stable finding).  There is no ulceration or break down of the skin.              Urethra:  normal appearing urethra with no masses, tenderness or lesions              Bartholins and Skenes: normal                 Vagina: normal appearing vagina with normal color and discharge, no lesions              Cervix: absent       Bimanual Exam:  Uterus:  uterus absent              Adnexa: no mass, fullness, tenderness                 Chaperone was present for exam.  ASSESSMENT  Chronic yeast vulvitis. Not improved with prolonged course of Diflucan. Diabetes mellitus. Obesity. Stable right labial mass. Hx sarcoidosis.  No apparent skin manifestations of sacroidosis of the vulvar skin. Anemia.  GI work up in progress.  PLAN  Counseled regarding yeast vulvovaginitis. Affirm is done. Fungal culture in progress. I am recommending a course of Terazol 7 vaginally and to vulva. Avoid use of pads and underwear as much as possible.  Keep the skin dry.  If vulvar yeast persists, consider HIV  testing and possible gentian violet treatment daily for 10 days.  An After Visit Summary was printed and given to the patient.  _15___ minutes face to face time of which over 50% was spent in counseling.

## 2015-03-31 LAB — WET PREP BY MOLECULAR PROBE
Candida species: NEGATIVE
GARDNERELLA VAGINALIS: NEGATIVE
TRICHOMONAS VAG: NEGATIVE

## 2015-04-02 ENCOUNTER — Telehealth: Payer: Self-pay | Admitting: Obstetrics and Gynecology

## 2015-04-02 NOTE — Telephone Encounter (Signed)
Patient has some questions about the cream she was prescribed at her visit on 03/30/15.

## 2015-04-02 NOTE — Telephone Encounter (Signed)
Spoke with patient. Patient recently switched from one brand of depends to another due to vaginal irritation and itching. Patient is asking if she may wear her old depends she has on hand at home because she ran out of the new brand. Patient states that her skin has felt better with using the new brand of depends. Reports less external irritation with new depends. States she is concerned about the cost of depends. Advised she may wear her old depends that she has at home, but if at any time she develops skin irritation she will need to switch back to her new depend brand. Advised it is recommended that she continue with her new brand as she reports less irritation with that brand.  She is agreeable and states she will use the depends she has on hand until she can buy more. Advised her affirm testing from 03/30/2015 was negative. Aware to continue using her Terazol ointment for 7 days only and return to the office for a recheck on 04/16/2015 with Dr.Silva. She is agreeable.  Routing to provider for final review. Patient agreeable to disposition. Will close encounter.

## 2015-04-04 ENCOUNTER — Telehealth: Payer: Self-pay | Admitting: Obstetrics and Gynecology

## 2015-04-04 NOTE — Telephone Encounter (Signed)
Patient has a question for the nurse. No information given. °

## 2015-04-04 NOTE — Telephone Encounter (Signed)
Spoke with patient. Patient states that she found a coupon for her depends in the paper and was able to get 2 boxes of 10 of the new brand she has been using. Patient also reports that she has continued to use the Terazol cream and is feeling better. Today will be her fifth day of Terzol treatment. Advised she will need to continue using the Terazol until Friday. She is agreeable and verbalizes understanding. Will return call if she needs anything. 12 minute phone call with patient.  Routing to provider for final review. Patient agreeable to disposition. Will close encounter.

## 2015-04-06 ENCOUNTER — Ambulatory Visit (AMBULATORY_SURGERY_CENTER): Payer: Self-pay

## 2015-04-06 ENCOUNTER — Telehealth: Payer: Self-pay | Admitting: Obstetrics and Gynecology

## 2015-04-06 VITALS — Ht 62.0 in | Wt 194.0 lb

## 2015-04-06 DIAGNOSIS — K219 Gastro-esophageal reflux disease without esophagitis: Secondary | ICD-10-CM

## 2015-04-06 DIAGNOSIS — D509 Iron deficiency anemia, unspecified: Secondary | ICD-10-CM

## 2015-04-06 MED ORDER — NA SULFATE-K SULFATE-MG SULF 17.5-3.13-1.6 GM/177ML PO SOLN: 1.0000 | Freq: Once | ORAL | Status: DC

## 2015-04-06 NOTE — Telephone Encounter (Signed)
Ok to use her Nystatin powder twice a day on the external areas of the creases in the thighs and on the vulvar surfaces. Keep follow up appointment for 04/16/15.

## 2015-04-06 NOTE — Progress Notes (Signed)
No egg or soy allergies Not on home 02 No previous anesthesia complications No diet or weight loss meds Hold Iron starting 04-13-15 (5 days prior to procedure day).

## 2015-04-06 NOTE — Telephone Encounter (Signed)
Patient says she is using the medication but she is still itching.

## 2015-04-06 NOTE — Telephone Encounter (Signed)
Return call to patient. Tonight is her last night of Terazol 7 treatment. She states that she is now having external vulvar itching but internal vaginal itching is improved. She has been using some Terazol on internal vulvar areas as instructed but is unsure if she is getting enough medication to this area.  Encouraged patient to try as much as possible to avoid itching and she states she understands but when she has itching she feels the need to itch.  No pelvic pain or fevers.  Advised patient will send message to Dr. Quincy Simmonds to review and if any additional instructions will call her back. If not, call us with update on Monday. Can move follow up appointment up if necessary. Advised will also need to give some time to allow Terazol to work after completing course.

## 2015-04-09 NOTE — Telephone Encounter (Signed)
Message left to return call to Onalee Steinbach at [phone].    

## 2015-04-09 NOTE — Telephone Encounter (Signed)
Spoke with patient. Discussed message from Dr. Quincy Simmonds.  Patient states she is feeling much improved and itching is better.  She will keep follow up appointment as scheduled. Will call back as needed.

## 2015-04-10 ENCOUNTER — Telehealth: Payer: Self-pay | Admitting: Gastroenterology

## 2015-04-10 NOTE — Telephone Encounter (Signed)
Spoke with pt and answered all her questions regarding the prep and her medications.Spent over 25 minutes talking with the pt.She will call back if she has any other questions.

## 2015-04-11 ENCOUNTER — Telehealth: Payer: Self-pay | Admitting: Emergency Medicine

## 2015-04-11 NOTE — Telephone Encounter (Signed)
Patient called to office and states she was returning call.   Patient confirms appointment for Monday with Dr. Sabra Heck 04/16/15.   She has questions about bowel prep  18 minutes on the phone to help her with coordinating her schedule and to confirm instructions from GI.  Encouraged her to take iron as instructed.  Patient denies complaints at this time.  Advised will confirm with  GI nurse and call back to assist with her prep for colonoscopy and EGD.

## 2015-04-12 NOTE — Telephone Encounter (Signed)
Patient returning call.

## 2015-04-12 NOTE — Telephone Encounter (Signed)
Message left to return call to Linda Thompson at [phone].    

## 2015-04-12 NOTE — Telephone Encounter (Signed)
Patient returning your call.

## 2015-04-13 NOTE — Telephone Encounter (Signed)
Called and spoke with patient. She feels she needs to be on clear liquids only starting today. I called [facility] and spoke with Pre-Visit nurse, Pamala Hurry.  She gave prep instructions for low residue diet starting today. Then clear liquids only Tuesday at 9 am until 12 noon Wednesday and bowel prep 1/2 on Tuesday night and 1/2 Wednesday morning before noon then NPO after noon.  25 minutes on the phone with patient confirming her instructions. She is advised again of instructions. She states she has them written down and was able to repeat back instructions. Advised patient multiple times that she needs to have solids as directed by no nuts, seeds, pop, corn, beans, peas, raw vegetables. Also today no iron pills.   Patient will have office visit on Monday with Dr. Quincy Simmonds for follow up.   She also confirms that she has GI nurse phone number in case she has any additional questions.   Routing to provider for final review. Patient agreeable to disposition. Will close encounter.

## 2015-04-16 ENCOUNTER — Ambulatory Visit (INDEPENDENT_AMBULATORY_CARE_PROVIDER_SITE_OTHER): Payer: Medicare Other | Admitting: Obstetrics and Gynecology

## 2015-04-16 ENCOUNTER — Encounter: Payer: Self-pay | Admitting: Obstetrics and Gynecology

## 2015-04-16 VITALS — BP 136/76 | HR 88 | Ht 62.0 in | Wt 190.0 lb

## 2015-04-16 DIAGNOSIS — N762 Acute vulvitis: Secondary | ICD-10-CM

## 2015-04-16 DIAGNOSIS — Z113 Encounter for screening for infections with a predominantly sexual mode of transmission: Secondary | ICD-10-CM

## 2015-04-16 LAB — HEPATITIS C ANTIBODY: HCV Ab: NEGATIVE

## 2015-04-16 NOTE — Progress Notes (Signed)
Patient ID: Linda Thompson, female   DOB: February 18, 1955, 60 y.o.   MRN: [id number] GYNECOLOGY  VISIT   HPI: 60 y.o.   Single  Caucasian  female   [name] with Patient's last menstrual period was 01/21/1992 (approximate)./Hysterectomy  here for follow up visit for chronic vulvitis.   Treated with Terazol at last visit.  Vulvar itching is improved but rash has recurred externally.  Patient has been treated with multiple regimens including Diflucan, Nystatin cream, Mycolog cream.  No real improvement.   Trying to keep the area dry.  Does wear Depends for incontinence.   Known diabetes. HgbA1C 6.7 on 02/01/15.  HIV negative 08/08/14.  Fungal culture of the vagina on 03/22/15 is negative to date. Multiple Affirm vaginitis swabs negative for yeast vaginitis.   Has diagnosis of sarcoidosis.  Known vulvar right mass which led ultimately to CT which was instrumental in making a diagnosis of sarcoidosis.    Has colonoscopy and endoscopy scheduled on 04/18/15. Has anemia.   GYNECOLOGIC HISTORY: Patient's last menstrual period was 01/21/1992 (approximate). Contraception:Hysterectomy Menopausal hormone therapy: n/a Last mammogram: 09-06-14 Category A Bi-Rads 1 Neg Last pap smear: 01-16-14 Neg        OB History    Gravida Para Term Preterm AB TAB SAB Ectopic Multiple Living   '0 0 0 0 0 0 0 0 0 0 '$         Patient Active Problem List   Diagnosis Date Noted  . Sarcoidosis ([place]) 10/27/2014  . Obesity 05/05/2011  . Irritable bowel syndrome 03/05/2010  . Diabetes ([place]) 08/13/2007  . ALLERGIC  RHINITIS 09/23/2006  . Hyperlipemia 03/11/2006  . MENTAL RETARDATION, MODERATE 03/11/2006  . GLAUCOMA NOS 03/11/2006  . Essential hypertension 03/11/2006  . GERD 03/11/2006  . MENINGIOMA 12/20/2001    Past Medical History  Diagnosis Date  . Type II or unspecified type diabetes mellitus without mention of complication, not stated as uncontrolled   . Hypertension   . Hyperlipemia   . GERD  (gastroesophageal reflux disease)     w/ hx diaphragmatic hernia  . Sarcoidosis ([place])     difuse bony lesions and LDA, dx by biopsy in 2016  . Osteoarthrosis, unspecified whether generalized or localized, ankle and foot   . DDD (degenerative disc disease), lumbar   . Glaucoma   . IBS (irritable bowel syndrome)   . Meningioma ([place])     ant falx, stable on prior imaging  . Moderate intellectual disabilities   . Transaminitis   . Obesity   . Chronic low back pain   . Allergic rhinitis   . Hx of fracture     R arm, foot  . Heart murmur   . Low back pain 11/22/2012  . GLAUCOMA NOS 03/11/2006    Qualifier: Diagnosis of  By: Diona Browner MD, Amy    . Allergy   . Anemia     Past Surgical History  Procedure Laterality Date  . Total abdominal hysterectomy  03/05/1992    leiomyoma and cellular leiomyoma  . Cataract extraction    . Elbow surgery      right elbow  . Lipoma excision Left [id number]    posterior left axilla  . Colonoscopy  04/15/2011    Current Outpatient Prescriptions  Medication Sig Dispense Refill  . acetaminophen (TYLENOL) 500 MG tablet Take 500 mg by mouth as needed (left ear pain).    Marland Kitchen aspirin EC 81 MG tablet Take 81 mg by mouth daily.    Marland Kitchen diltiazem (CARDIZEM CD)  240 MG 24 hr capsule TAKE ONE CAPSULE BY MOUTH EVERY DAY (Patient taking differently: TAKE 240 MG BY MOUTH DAILY) 90 capsule 3  . ferrous sulfate 325 (65 FE) MG tablet Take 1 tablet (325 mg total) by mouth 2 (two) times daily with a meal. 60 tablet 3  . hydrochlorothiazide (MICROZIDE) 12.5 MG capsule TAKE 1 CAPSULE BY MOUTH ONCE DAILY 90 capsule 1  . ipratropium (ATROVENT) 0.06 % nasal spray Place 2 sprays into the nose 4 (four) times daily.  5  . ketoconazole (NIZORAL) 2 % shampoo APPLY TO AFFECTED AREA TWICE A WEEK AS DIRECTED 120 mL 1  . KLOR-CON M20 20 MEQ tablet TAKE 1 TABLET BY MOUTH EVERY DAY 90 tablet 3  . Lactobacillus (FLORAJEN ACIDOPHILUS PO) Take 1 tablet by mouth daily.    Marland Kitchen latanoprost (XALATAN)  0.005 % ophthalmic solution Place 1 drop into both eyes at bedtime.     Marland Kitchen loratadine (CLARITIN) 10 MG tablet Take 10 mg by mouth daily.    . metFORMIN (GLUCOPHAGE) 500 MG tablet TAKE 1 TABLET BY MOUTH TWICE A DAY WITH A MEAL 180 tablet 1  . Na Sulfate-K Sulfate-Mg Sulf (SUPREP BOWEL PREP) SOLN Take 1 kit by mouth once. Name brand only, no substitutions, take as directed 354 mL 0  . nystatin (MYCOSTATIN/NYSTOP) 100000 UNIT/GM POWD Reported on 04/06/2015    . omeprazole (PRILOSEC) 20 MG capsule TAKE 1 CAPSULE (20 MG TOTAL) BY MOUTH 2 (TWO) TIMES DAILY. 180 capsule 3  . pravastatin (PRAVACHOL) 40 MG tablet TAKE 1 TABLET BY MOUTH EVERY EVENING 90 tablet 1  . triamcinolone cream (KENALOG) 0.1 % Apply 1 application topically 2 (two) times daily as needed. 30 g 0  . [DISCONTINUED] Calcium Carbonate-Vitamin D (CALCIUM-VITAMIN D) 500-200 MG-UNIT per tablet Take 1 tablet by mouth 2 (two) times daily with a meal.      . [DISCONTINUED] diltiazem (TIAZAC) 240 MG 24 hr capsule Take 1 capsule (240 mg total) by mouth daily. 90 capsule 0  . [DISCONTINUED] fexofenadine (ALLEGRA) 180 MG tablet Take 1 tablet (180 mg total) by mouth daily. 15 tablet 0   No current facility-administered medications for this visit.     ALLERGIES: Ace inhibitors; Amoxicillin-pot clavulanate; Augmentin; Azithromycin; Ceftin; Cefuroxime; Cyclobenzaprine hcl; Flexeril; Flonase; and Ibuprofen  Family History  Problem Relation Age of Onset  . Diabetes Mother   . Stroke Mother   . Hypertension Mother   . Diabetes Maternal Aunt   . Heart disease Maternal Aunt      (had pacemaker)  . Diabetes Maternal Grandmother   . Stroke Maternal Grandmother   . Hypertension Maternal Grandmother   . Stroke Father   . Colon cancer Neg Hx     Social History   Social History  . Marital Status: Single    Spouse Name: N/A  . Number of Children: 0  . Years of Education: N/A   Occupational History  . Disabled (from arm fracture)    Social  History Main Topics  . Smoking status: Never Smoker   . Smokeless tobacco: Never Used  . Alcohol Use: No  . Drug Use: No  . Sexual Activity: Not Currently    Birth Control/ Protection: Surgical     Comment: Hyst--TAH--unsure if has ovaries   Other Topics Concern  . Not on file   Social History Narrative   Lives at [facility] (on [address] in [place])      [place] for arm fracture  Mental Retardation, but is able to drive to places she is comfortable with and prepare her own meals and manage most of her own affairs.      [name] (neighbor) is POA/HCPOA      Reports she gets regular exercise and tries to eat healthy    ROS:  Pertinent items are noted in HPI.  PHYSICAL EXAMINATION:    BP 136/76 mmHg  Pulse 88  Ht '5\' 2"'$  (1.575 m)  Wt 190 lb (86.183 kg)  BMI 34.74 kg/m2  LMP 01/21/1992 (Approximate)    General appearance: alert, cooperative and appears stated age  Pelvic: External genitalia:  Intertriginous areas with beefy red plaques and creamy exudate.                Urethra:  normal appearing urethra with no masses, tenderness or lesions              Bartholins and Skenes: normal                 Vagina: normal appearing vagina with normal color and discharge, no lesions              Cervix: absent         Bimanual Exam:  Uterus:  uterus absent              Adnexa: no mass, fullness, tenderness and exam limited by body habitus.                  Chaperone was present for exam.  ASSESSMENT  Chronic vulvitis.  Has sarcoidosis.  Diabetes.    HIV negative.   PLAN  Counseled regarding vulvitis.  [place] for use of Nystatin powder bid.  I am referring to dermatology at this point.  Patient may need biopsy and other treatment choices.  May have superimposed bacterial infection? Will check Hep C aby according to current guidelines. Follow up here prn.  An After Visit Summary was printed and given to the  patient.  _15_____ minutes face to face time of which over 50% was spent in counseling.

## 2015-04-16 NOTE — Progress Notes (Signed)
Scheduled patient while in office to see Aurther Loft, PA at [facility] on 05/01/2015 at 11:15 am. The patient is agreeable to date and time. Appointment date and time written down for patient along with address, and telephone number to [facility].

## 2015-04-17 ENCOUNTER — Encounter: Payer: Self-pay | Admitting: Obstetrics and Gynecology

## 2015-04-18 ENCOUNTER — Encounter: Payer: Medicare Other | Admitting: Gastroenterology

## 2015-04-18 ENCOUNTER — Encounter: Payer: Self-pay | Admitting: Gastroenterology

## 2015-04-18 ENCOUNTER — Ambulatory Visit (AMBULATORY_SURGERY_CENTER): Payer: Medicare Other | Admitting: Gastroenterology

## 2015-04-18 ENCOUNTER — Telehealth: Payer: Self-pay

## 2015-04-18 VITALS — BP 112/75 | HR 86 | Temp 98.0°F | Resp 15 | Ht 62.0 in | Wt 194.0 lb

## 2015-04-18 DIAGNOSIS — D120 Benign neoplasm of cecum: Secondary | ICD-10-CM | POA: Diagnosis not present

## 2015-04-18 DIAGNOSIS — K317 Polyp of stomach and duodenum: Secondary | ICD-10-CM

## 2015-04-18 DIAGNOSIS — D125 Benign neoplasm of sigmoid colon: Secondary | ICD-10-CM

## 2015-04-18 DIAGNOSIS — I10 Essential (primary) hypertension: Secondary | ICD-10-CM | POA: Diagnosis not present

## 2015-04-18 DIAGNOSIS — F79 Unspecified intellectual disabilities: Secondary | ICD-10-CM | POA: Diagnosis not present

## 2015-04-18 DIAGNOSIS — D500 Iron deficiency anemia secondary to blood loss (chronic): Secondary | ICD-10-CM | POA: Diagnosis not present

## 2015-04-18 DIAGNOSIS — D869 Sarcoidosis, unspecified: Secondary | ICD-10-CM | POA: Diagnosis not present

## 2015-04-18 DIAGNOSIS — D509 Iron deficiency anemia, unspecified: Secondary | ICD-10-CM

## 2015-04-18 DIAGNOSIS — K2950 Unspecified chronic gastritis without bleeding: Secondary | ICD-10-CM | POA: Diagnosis not present

## 2015-04-18 DIAGNOSIS — D122 Benign neoplasm of ascending colon: Secondary | ICD-10-CM

## 2015-04-18 LAB — GLUCOSE, CAPILLARY
GLUCOSE-CAPILLARY: 87 mg/dL (ref 65–99)
GLUCOSE-CAPILLARY: 93 mg/dL (ref 65–99)

## 2015-04-18 MED ORDER — SODIUM CHLORIDE 0.9 % IV SOLN: 500.0000 mL | INTRAVENOUS | Status: DC

## 2015-04-18 NOTE — Op Note (Signed)
[facility] Patient Name: Linda Thompson Procedure Date: 04/18/2015 3:16 PM MRN: [id number] Endoscopist: Remo Lipps P. Havery Moros , MD Age: 60 Referring MD:  Date of Birth: 07-29-1955 Gender: Female Procedure:                Upper GI endoscopy Indications:              Iron deficiency anemia Medicines:                Monitored Anesthesia Care Procedure:                Pre-Anesthesia Assessment:                           - Prior to the procedure, a History and Physical                            was performed, and patient medications and                            allergies were reviewed. The patient's tolerance of                            previous anesthesia was also reviewed. The risks                            and benefits of the procedure and the sedation                            options and risks were discussed with the patient.                            All questions were answered, and informed consent                            was obtained. Prior Anticoagulants: The patient has                            taken aspirin, last dose was 1 day prior to                            procedure. ASA Grade Assessment: II - A patient                            with mild systemic disease. After reviewing the                            risks and benefits, the patient was deemed in                            satisfactory condition to undergo the procedure.                           - Prior to the procedure, a History and Physical  was performed, and patient medications and                            allergies were reviewed. The patient's tolerance of                            previous anesthesia was also reviewed. The risks                            and benefits of the procedure and the sedation                            options and risks were discussed with the patient.                            All questions were answered, and informed consent                       was obtained. Prior Anticoagulants: The patient has                            taken aspirin, last dose was 1 day prior to                            procedure. ASA Grade Assessment: II - A patient                            with mild systemic disease. After reviewing the                            risks and benefits, the patient was deemed in                            satisfactory condition to undergo the procedure.                           After obtaining informed consent, the endoscope was                            passed under direct vision. Throughout the                            procedure, the patient's blood pressure, pulse, and                            oxygen saturations were monitored continuously. The                            Model GIF-HQ190 [id number]) scope was introduced                            through the mouth, and advanced to the second part  of duodenum. The upper GI endoscopy was                            accomplished without difficulty. The patient                            tolerated the procedure well. Scope In: Scope Out: Findings:      Esophagogastric landmarks were identified: the Z-line was found at 32 cm       and regular, the gastroesophageal junction was found at 32 cm and the       upper extent of the gastric folds was found at 34 cm from the incisors.      A 2 cm hiatal hernia was present.      The exam of the esophagus was otherwise normal.      Scattered mucosal changes were found in the gastric fundus, in the       gastric body and in the gastric antrum. There were multiple polypoid       sessile and semipedunculated lesions which had an inflamed and ulcerated       appearance. The lesions in the proximal body and fundus appeared to be       the largest, were broad based, and were a few cm in size. One long       pendunculated lesion (a few cm in size) was very erythematous but not       bleeding  and located in the gastric body. Small more pedunculated       polypoid lesions were in the distal gastric body and antrum. Biopsies       were taken with a cold forceps for histology. Polypectomy was not       performed at this time, I would prefer to have pathology back and have       these lesions removed in the hospital setting given potential risk of       bleeding associated with these lesions. Unclear if these findings may       represent gastric sarcoidosis in light of the patient's history versus       other type of ulcerated / inflamed gastric polyps.      The exam of the stomach was otherwise normal.      Biopsies were taken with a cold forceps in the gastric body and in the       gastric antrum for Helicobacter pylori testing.      The duodenal bulb and second portion of the duodenum were normal. Complications:            No immediate complications. Estimated blood loss:                            Minimal. Estimated Blood Loss:     Estimated blood loss was minimal. Impression:               - Esophagogastric landmarks identified.                           - 2 cm hiatal hernia.                           - Erythematous polypoid mucosa in the gastric  fundus, gastric body and antrum as outlined above.                            This is almost certainly the cause of the patient's                            anemia. Biopsied.                           - Normal duodenal bulb and second portion of the                            duodenum.                           - Biopsies were taken with a cold forceps for                            Helicobacter pylori testing. Recommendation:           - Patient has a contact number available for                            emergencies. The signs and symptoms of potential                            delayed complications were discussed with the                            patient. Return to normal activities tomorrow.                             Written discharge instructions were provided to the                            patient.                           - Resume previous diet.                           - Continue present medications.                           - Await pathology results.                           - Repeat upper endoscopy for possible polypectomy /                            EMR of gastric lesions pending the pathology results Procedure Code(s):        --- Professional ---                           [phone], Esophagogastroduodenoscopy, flexible,  transoral; with biopsy, single or multiple CPT copyright 2016 American Medical Association. All rights reserved. Remo Lipps P. Armbruster, MD 04/18/2015 4:22:07 PM This report has been signed electronically. Number of Addenda: 0 Referring MD:      Ozzie Hoyle, MD

## 2015-04-18 NOTE — Progress Notes (Signed)
To PACU  Report to RN PT awake and Alert.

## 2015-04-18 NOTE — Progress Notes (Signed)
I went over with the pt and her neighbor to hold aspirin 81 mg for two weeks.  To add iron back in to her meds.  And continue all other prescribed meds.  I assisted pt with dressing and her friend went to get the car.  No complaints noted in the recovery room. maw

## 2015-04-18 NOTE — Op Note (Signed)
[name] Procedure Date: 04/18/2015 3:16 PM MRN: [id number] Endoscopist: Viviann Spare P. Adela Lank , MD Age: 60 Referring MD:  Date of Birth: 03-31-1955 Gender: Female Procedure:                Colonoscopy Indications:              Iron deficiency anemia Medicines:                Monitored Anesthesia Care Procedure:                Pre-Anesthesia Assessment:                           - Prior to the procedure, a History and Physical                            was performed, and patient medications and                            allergies were reviewed. The patient's tolerance of                            previous anesthesia was also reviewed. The risks                            and benefits of the procedure and the sedation                            options and risks were discussed with the patient.                            All questions were answered, and informed consent                            was obtained. Prior Anticoagulants: The patient has                            taken aspirin, last dose was 1 day prior to                            procedure. ASA Grade Assessment: II - A patient                            with mild systemic disease. After reviewing the                            risks and benefits, the patient was deemed in                            satisfactory condition to undergo the procedure.                           After obtaining informed consent, the colonoscope  was passed under direct vision. Throughout the                            procedure, the patient's blood pressure, pulse, and                            oxygen saturations were monitored continuously. The                            Model [id number]-H190L [id number]) scope was introduced                            through the anus and advanced to the the terminal                            ileum, with identification of the appendiceal                      orifice and IC valve. The colonoscopy was performed                            without difficulty. The patient tolerated the                            procedure well. The quality of the bowel                            preparation was adequate. The terminal ileum,                            ileocecal valve, appendiceal orifice, and rectum                            were photographed. Scope In: 3:39:50 PM Scope Out: 4:04:05 PM Scope Withdrawal Time: 0 hours 19 minutes 54 seconds  Total Procedure Duration: 0 hours 24 minutes 15 seconds  Findings:      The perianal and digital rectal examinations were normal.      Two sessile polyps were found in the cecum. The polyps were 3 to 4 mm in       size. These polyps were removed with a cold snare. Resection and       retrieval were complete.      A 6 mm polyp was found in the ascending colon. The polyp was sessile.       The polyp was removed with a cold snare. Resection and retrieval were       complete.      A 3 mm polyp was found in the sigmoid colon. The polyp was sessile. The       polyp was removed with a cold snare. Resection and retrieval were       complete.      The terminal ileum appeared normal.      Anal papilla(e) were hypertrophied.      The exam was otherwise without abnormality. No pathology noted to cause       iron deficiency anemia on colonoscopy. Complications:  No immediate complications. Estimated blood loss:                            Minimal. Estimated Blood Loss:     Estimated blood loss was minimal. Impression:               - Two 3 to 4 mm polyps in the cecum, removed with a                            cold snare. Resected and retrieved.                           - One 6 mm polyp in the ascending colon, removed                            with a cold snare. Resected and retrieved.                           - One 3 mm polyp in the sigmoid colon, removed with                            a  cold snare. Resected and retrieved.                           - The examined portion of the ileum was normal.                           - Anal papilla(e) were hypertrophied.                           - The examination was otherwise normal. Recommendation:           - Patient has a contact number available for                            emergencies. The signs and symptoms of potential                            delayed complications were discussed with the                            patient. Return to normal activities tomorrow.                            Written discharge instructions were provided to the                            patient.                           - Resume previous diet.                           - Continue present medications.                           -  No aspirin, ibuprofen, naproxen, or other                            non-steroidal anti-inflammatory drugs for 2 weeks                            after polyp removal.                           - Await pathology results.                           - Repeat colonoscopy is recommended for                            surveillance. The colonoscopy date will be                            determined after pathology results from today's                            exam become available for review. Procedure Code(s):        --- Professional ---                           [id number], Colonoscopy, flexible; with removal of                            tumor(s), polyp(s), or other lesion(s) by snare                            technique CPT copyright 2016 American Medical Association. All rights reserved. Viviann Spare P. Zineb Glade, MD 04/18/2015 4:08:53 PM This report has been signed electronically. Number of Addenda: 0 Referring MD:      Milagros Reap, MD

## 2015-04-18 NOTE — Progress Notes (Signed)
No problems noted in the recovery room. maw 

## 2015-04-18 NOTE — Telephone Encounter (Signed)
Left message to call Kaitlyn at [phone]. 

## 2015-04-18 NOTE — Telephone Encounter (Signed)
-----   Message from Nunzio Cobbs, MD sent at 04/17/2015  2:27 PM EDT ----- Please inform patient of her negative hepatitis C test result.  Cc- Marisa Sprinkles

## 2015-04-18 NOTE — Progress Notes (Signed)
Called to room to assist during endoscopic procedure.  Patient ID and intended procedure confirmed with present staff. Received instructions for my participation in the procedure from the performing physician.  

## 2015-04-18 NOTE — Patient Instructions (Addendum)
YOU HAD AN ENDOSCOPIC PROCEDURE TODAY AT THE [facility]:   Refer to the procedure report that was given to you for any specific questions about what was found during the examination.  If the procedure report does not answer your questions, please call your gastroenterologist to clarify.  If you requested that your care partner not be given the details of your procedure findings, then the procedure report has been included in a sealed envelope for you to review at your convenience later.  YOU SHOULD EXPECT: Some feelings of bloating in the abdomen. Passage of more gas than usual.  Walking can help get rid of the air that was put into your GI tract during the procedure and reduce the bloating. If you had a lower endoscopy (such as a colonoscopy or flexible sigmoidoscopy) you may notice spotting of blood in your stool or on the toilet paper. If you underwent a bowel prep for your procedure, you may not have a normal bowel movement for a few days.  Please Note:  You might notice some irritation and congestion in your nose or some drainage.  This is from the oxygen used during your procedure.  There is no need for concern and it should clear up in a day or so.  SYMPTOMS TO REPORT IMMEDIATELY:   Following lower endoscopy (colonoscopy or flexible sigmoidoscopy):  Excessive amounts of blood in the stool  Significant tenderness or worsening of abdominal pains  Swelling of the abdomen that is new, acute  Fever of 100F or higher   Following upper endoscopy (EGD)  Vomiting of blood or coffee ground material  New chest pain or pain under the shoulder blades  Painful or persistently difficult swallowing  New shortness of breath  Fever of 100F or higher  Black, tarry-looking stools  For urgent or emergent issues, a gastroenterologist can be reached at any hour by calling [phone].   DIET: Your first meal following the procedure should be a small meal and then it is ok to progress to  your normal diet. Heavy or fried foods are harder to digest and may make you feel nauseous or bloated.  Likewise, meals heavy in dairy and vegetables can increase bloating.  Drink plenty of fluids but you should avoid alcoholic beverages for 24 hours.  ACTIVITY:  You should plan to take it easy for the rest of today and you should NOT DRIVE or use heavy machinery until tomorrow (because of the sedation medicines used during the test).    FOLLOW UP: Our staff will call the number listed on your records the next business day following your procedure to check on you and address any questions or concerns that you may have regarding the information given to you following your procedure. If we do not reach you, we will leave a message.  However, if you are feeling well and you are not experiencing any problems, there is no need to return our call.  We will assume that you have returned to your regular daily activities without incident.  If any biopsies were taken you will be contacted by phone or by letter within the next 1-3 weeks.  Please call us at [phone] if you have not heard about the biopsies in 3 weeks.    SIGNATURES/CONFIDENTIALITY: You and/or your care partner have signed paperwork which will be entered into your electronic medical record.  These signatures attest to the fact that that the information above on your After Visit Summary has been reviewed   and is understood.  Full responsibility of the confidentiality of this discharge information lies with you and/or your care-partner.    Handouts were given to your care partner on polyps and Hiatal hernia. Your blood sugar was 93 in the recovery room. Please hold aspirin, aspirin products, and any NSAIDS for 2 weeks. You may resume your other current medications today. Await biopsy results. Please call if any questions or concerns.

## 2015-04-19 ENCOUNTER — Telehealth: Payer: Self-pay

## 2015-04-19 NOTE — Telephone Encounter (Signed)
Patient notified of results: see result note

## 2015-04-19 NOTE — Telephone Encounter (Signed)
  Follow up Call-  Call back number 04/18/2015  Post procedure Call Back phone  # [phone] friends number  Permission to leave phone message Yes    Patient was called for follow up after procedure on 04/18/2015. I spoke with Linda Thompson who oversees her care. Linda Thompson answered all of the questions below.   Patient questions:  Do you have a fever, pain , or abdominal swelling? No. Pain Score  0 *  Have you tolerated food without any problems? Yes.    Have you been able to return to your normal activities? Yes.    Do you have any questions about your discharge instructions: Diet   No. Medications  No. Follow up visit  No.  Do you have questions or concerns about your Care? No.  Actions: * If pain score is 4 or above: No action needed, pain <4.

## 2015-04-19 NOTE — Telephone Encounter (Signed)
Routing to Dr.Silva as FYI. Will close encounter.

## 2015-04-20 LAB — FUNGUS CULTURE W SMEAR

## 2015-04-23 ENCOUNTER — Institutional Professional Consult (permissible substitution): Payer: Medicare Other | Admitting: Pulmonary Disease

## 2015-04-23 ENCOUNTER — Other Ambulatory Visit: Payer: Self-pay | Admitting: Family Medicine

## 2015-04-24 ENCOUNTER — Encounter: Payer: Self-pay | Admitting: Internal Medicine

## 2015-04-24 ENCOUNTER — Ambulatory Visit (INDEPENDENT_AMBULATORY_CARE_PROVIDER_SITE_OTHER): Payer: Medicare Other | Admitting: Internal Medicine

## 2015-04-24 ENCOUNTER — Ambulatory Visit (INDEPENDENT_AMBULATORY_CARE_PROVIDER_SITE_OTHER)
Admission: RE | Admit: 2015-04-24 | Discharge: 2015-04-24 | Disposition: A | Discharge status: Home / Self Care | Payer: Medicare Other | Source: Ambulatory Visit | Attending: Internal Medicine | Admitting: Internal Medicine

## 2015-04-24 VITALS — BP 124/80 | HR 92 | Ht 62.0 in | Wt 191.0 lb

## 2015-04-24 DIAGNOSIS — D869 Sarcoidosis, unspecified: Secondary | ICD-10-CM

## 2015-04-24 DIAGNOSIS — I10 Essential (primary) hypertension: Secondary | ICD-10-CM | POA: Diagnosis not present

## 2015-04-24 NOTE — Assessment & Plan Note (Addendum)
Bx 10/03/14 L [place] bx - ACE level reported to be 98 ? 01/2015  - cxr 04/24/2015 wnl   A good rule of thumb is that >95% of pts with active sarcoid in any organ will have some plain cxr changes - on the other hand  if there are active pulmonary symptoms the cxr will look much worse than the patient:  No evidence of either scenario here/ strongly doubt any significant active dz but share others concerns about the extra-pulmonary imaging studies but I see no indication for treatment from my perspective and certainly prednisone not indicated given dx of dm/obesity  > defer rx to rheumatology   Discussed the pathophsiology of sarcoid emphasizing it's a benign condition that usually burns out w/in 3 years and unless there is organ dysfunction or hypercalcemia there is usually no reason to treat it.  Pulmonary f/u can be prn    Total time devoted to counseling  = 35/78m review case with pt/ discussion of options/alternatives/ personally creating in presence of pt  then going over specific  Instructions directly with the pt including how to use all of the meds but in particular covering each new medication in detail (see avs)

## 2015-04-24 NOTE — Patient Instructions (Signed)
Please remember to go to the  x-ray department downstairs for your tests - we will call you with the results when they are available.  You need to keep appointment to see your eye doctor for Sarcoid evaluation  Sarcoidosis is a benign inflammatory condition caused by  The  immune system being too revved up like a thermostat on your furnace that's partially  stuck causing arthitis, rash, short of breath and cough and vision issues.  It typically burns itself out in 75% of patients by the end of 3 years with little to indicate that we really change the natural course of the disease by aggressive treatments intended to alter it.  Treatment is generally reserved for patients with major symptoms we can attribute to sarcoid or if a vital organ (like the eye or kidney or nervous system) becomes affected.    Pulmonary follow up is as needed

## 2015-04-24 NOTE — Progress Notes (Signed)
Subjective:    Patient ID: Linda Thompson, female    DOB: [birth year],    MRN: [id number]  HPI  33 yowf never smoker referred to pulmonary clinic 04/24/2015 by Dr Linda Thompson for ? Sarcoid   04/24/2015 1st [facility] office visit/ Linda Thompson   Chief Complaint  Patient presents with  . Pulmonary Consult    Referred by Dr Linda Thompson for eval of possible sarcoid.  Pt denies any respiratory co's.   incidentally noted LDA   during w/u for anemia/ bony lesions on bone scan > L Linda Thompson bx 10/03/14 p "POS PET" by onc = N[facility] and referred to [facility] who detected ACE 97 and referred to pulmonary for sob though denies to me.  Not limited by breathing from desired activities - no rash, fever, sweats, cough, wt loss or arthitic symtoms   No obvious other patterns in day to day or daytime variabilty or assoc   cp or chest tightness, subjective wheeze overt sinus or hb symptoms. No unusual exp hx or h/o childhood pna/ asthma or knowledge of premature birth.  Sleeping ok without nocturnal  or early am exacerbation  of respiratory  c/o's or need for noct saba. Also denies any obvious fluctuation of symptoms with weather or environmental changes or other aggravating or alleviating factors except as outlined above   Current Medications, Allergies, Complete Past Medical History, Past Surgical History, Family History, and Social History were reviewed in [employer] record.              Review of Systems  Constitutional: Negative for fever, chills and unexpected weight change.  HENT: Negative for congestion, dental problem, ear pain, nosebleeds, postnasal drip, rhinorrhea, sinus pressure, sneezing, sore throat, trouble swallowing and voice change.   Eyes: Negative for visual disturbance.  Respiratory: Negative for cough, choking and shortness of breath.   Cardiovascular: Negative for chest pain and leg swelling.  Gastrointestinal: Negative for vomiting, abdominal pain and diarrhea.  Genitourinary: Negative  for difficulty urinating.  Musculoskeletal: Negative for arthralgias.  Skin: Negative for rash.  Neurological: Negative for tremors, syncope and headaches.  Hematological: Does not bruise/bleed easily.       Objective:   Physical Exam  amb wf nad   Wt Readings from Last 3 Encounters:  04/24/15 191 lb (86.637 kg)  04/18/15 194 lb (87.998 kg)  04/16/15 190 lb (86.183 kg)    Vital signs reviewed   HEENT: nl dentition, turbinates, and oropharynx. Nl external ear canals without cough reflex   NECK :  without JVD/Nodes/TM/ nl carotid upstrokes bilaterally   LUNGS: no acc muscle use,  Nl contour chest which is clear to A and P bilaterally without cough on insp or exp maneuvers   CV:  RRR  no s3 or murmur or increase in P2, no edema   ABD:  soft and nontender with nl inspiratory excursion in the supine position. No bruits or organomegaly, bowel sounds nl  MS:  Nl gait/ ext warm without deformities, calf tenderness, cyanosis or clubbing No obvious joint restrictions   SKIN: warm and dry without lesions    NEURO:  alert, approp, nl sensorium with  no motor deficits     CXR PA and Lateral:   04/24/2015 :    I personally reviewed images and agree with radiology impression as follows:   No acute or focal cardiopulmonary disease. No definite adenopathy known by plain film exam.       Assessment & Plan:

## 2015-04-25 ENCOUNTER — Encounter: Payer: Medicare Other | Admitting: Gastroenterology

## 2015-04-25 ENCOUNTER — Telehealth: Payer: Self-pay | Admitting: Obstetrics and Gynecology

## 2015-04-25 ENCOUNTER — Telehealth: Payer: Self-pay | Admitting: Certified Nurse Midwife

## 2015-04-25 NOTE — Telephone Encounter (Signed)
Patient is asking to talk with Dr.Silva's nurse. Patient said "I has a question for Dr.Silva ", no further details given.

## 2015-04-25 NOTE — Telephone Encounter (Signed)
Agree with plan 

## 2015-04-25 NOTE — Telephone Encounter (Signed)
Spoke with patient. Patient called in earlier today to discuss vaginal itching (please see telephone encounter). Patient states that she found Terazol cream that she had left over from a previous appointment. She would like to know if she can use this for the itching she is experiencing. "It is the cream I put inside me. Can I use that too?" States that the itching is externally. Reports she has been using Nystatin powder as needed externally with relief. Patient denies vaginal redness, swelling, or discharge. States she does not feel she has a yeast infection. Advised not to use Terazol cream at this time as we do not want to cause further irritation to the area. Advised only to use Nystatin powder as needed. Patient's last affirm testing on 3/10 was negative. She is scheduled to [facility] on [facility] for further evaluation of vaginal itching. Advised she will need to keep this appointment as scheduled. She is agreeable. She declines for me to move her dermatology appointment up due to other appointments she has scheduled. The patient verbalizes understanding of instructions given.  [name] do you agree with these recommendations?

## 2015-04-25 NOTE — Telephone Encounter (Signed)
Patient says she spoke with the nurse earlier today and has another question.

## 2015-04-25 NOTE — Assessment & Plan Note (Signed)
Complicated by HBP/ Hyperlipidemia/ DM   Body mass index is 34.93    Lab Results  Component Value Date   TSH 2.25 06/04/2012     Contributing to gerd tendency/ doe/reviewed the need and the process to achieve and maintain neg calorie balance > defer f/u primary care including intermittently monitoring thyroid status

## 2015-04-25 NOTE — Progress Notes (Signed)
Quick Note:  Spoke with [name] (ec) and notified of results/recs per MW  She verbalized understanding and will inform the pt ______

## 2015-04-25 NOTE — Telephone Encounter (Signed)
Spoke with patient. Patient states that she began to have external vaginal itching last night. Used Nystatin powder externally with relief. Denies any vaginal discharge, vaginal redness or vaginal swelling. Patient is asking if it is okay to use Nystatin powder if she needs to. Advised she may use Nystatin powder as needed externally for relief. Patient has been referred by Dr.Silva to see dermatology. She is scheduled for 05/01/2015 at 11:15 am with Aurther Loft, PA at [facility]. Advised she will need to keep this appointment for further evaluation of recurring vaginal itching. She is agreeable and verbalizes understanding.  Routing to [employer] CNM for review and advise as Dr.Silva is out of the office this week and [name] has seen this patient previously.

## 2015-04-25 NOTE — Telephone Encounter (Signed)
Agree to plan

## 2015-04-27 ENCOUNTER — Institutional Professional Consult (permissible substitution): Payer: Medicare Other | Admitting: Pulmonary Disease

## 2015-04-30 ENCOUNTER — Ambulatory Visit: Payer: Medicare Other | Admitting: Family Medicine

## 2015-05-01 DIAGNOSIS — B3749 Other urogenital candidiasis: Secondary | ICD-10-CM | POA: Diagnosis not present

## 2015-05-01 DIAGNOSIS — L293 Anogenital pruritus, unspecified: Secondary | ICD-10-CM | POA: Diagnosis not present

## 2015-05-09 DIAGNOSIS — E119 Type 2 diabetes mellitus without complications: Secondary | ICD-10-CM | POA: Diagnosis not present

## 2015-05-09 DIAGNOSIS — H04123 Dry eye syndrome of bilateral lacrimal glands: Secondary | ICD-10-CM | POA: Diagnosis not present

## 2015-05-09 DIAGNOSIS — H401131 Primary open-angle glaucoma, bilateral, mild stage: Secondary | ICD-10-CM | POA: Diagnosis not present

## 2015-05-09 DIAGNOSIS — Z961 Presence of intraocular lens: Secondary | ICD-10-CM | POA: Diagnosis not present

## 2015-05-09 DIAGNOSIS — E113213 Type 2 diabetes mellitus with mild nonproliferative diabetic retinopathy with macular edema, bilateral: Secondary | ICD-10-CM | POA: Diagnosis not present

## 2015-05-09 DIAGNOSIS — D860 Sarcoidosis of lung: Secondary | ICD-10-CM | POA: Diagnosis not present

## 2015-05-10 DIAGNOSIS — R0602 Shortness of breath: Secondary | ICD-10-CM | POA: Diagnosis not present

## 2015-05-10 DIAGNOSIS — D869 Sarcoidosis, unspecified: Secondary | ICD-10-CM | POA: Diagnosis not present

## 2015-05-15 ENCOUNTER — Other Ambulatory Visit: Payer: Self-pay | Admitting: Family Medicine

## 2015-05-16 ENCOUNTER — Telehealth: Payer: Self-pay | Admitting: Certified Nurse Midwife

## 2015-05-16 ENCOUNTER — Encounter: Payer: Self-pay | Admitting: Certified Nurse Midwife

## 2015-05-16 ENCOUNTER — Ambulatory Visit (INDEPENDENT_AMBULATORY_CARE_PROVIDER_SITE_OTHER): Payer: Medicare Other | Admitting: Certified Nurse Midwife

## 2015-05-16 VITALS — BP 120/78 | HR 72 | Resp 16 | Ht 62.0 in | Wt 189.0 lb

## 2015-05-16 DIAGNOSIS — L298 Other pruritus: Secondary | ICD-10-CM

## 2015-05-16 DIAGNOSIS — N760 Acute vaginitis: Secondary | ICD-10-CM | POA: Diagnosis not present

## 2015-05-16 DIAGNOSIS — L293 Anogenital pruritus, unspecified: Secondary | ICD-10-CM | POA: Diagnosis not present

## 2015-05-16 DIAGNOSIS — N898 Other specified noninflammatory disorders of vagina: Secondary | ICD-10-CM

## 2015-05-16 NOTE — Telephone Encounter (Signed)
Spoke with patient. Advised her affirm results have not yet returned, but that we will be in contact with her as soon as they do. She is agreeable. She would like to verify the pharmacy we have on file. Verified pharmacy as [facility] off [address] and [address]. She is agreeable and verifies this is the correct pharmacy.  Routing to provider for final review. Patient agreeable to disposition. Will close encounter.

## 2015-05-16 NOTE — Telephone Encounter (Signed)
Patient was seen today and called in later to see if her results were ready and to confirm we have her pharmacy information. Her pharmacy on file is correct.

## 2015-05-16 NOTE — Progress Notes (Signed)
60 y.o. Single Caucasian female [name] here with complaint of vaginal symptoms of itching only inside of vaginal opening. Has noted no increase in vaginal discharge or any vaginal odor. Not sexually active. History of chronic vaginitis and vulvitis with previous poly medication. Was seen per patient by Dermatology with good relief after taking " 4 Pills". Patient continues to use antifungal powder sparingly in groin area to control moisture without problems. Onset of symptoms 4 days ago. Denies new personal products or vaginal dryness.  Urinary symptoms none . Patient has appointment with dermatology for follow up in a few weeks. Patient does not feel right vulva size has changed. She is wearing Depends only when out and regular underwear at home, with better control of moisture related itching. Denies any vaginal bleeding. Patient brings with her hydrocortisone cream, antifungal powder all OTC and Rx Nystatin powder.   O:Healthy female WDWN Affect: normal, orientation x 3  Exam: Abdomen:soft, non tender Inguinal Lymph nodes: no enlargement or tenderness, groin skin area normal, no redness or exudate Pelvic exam: External genital: normal female, atrophic, but no scaling or redness, right labia enlarged due to Lipoma, no change in size since last exam BUS: negative Vagina: scant discharge noted. Very small area of redness noted in introital area of vagina, with slight excoriated appearance ? scratching  Affirm taken. Applied small amount of hydrocortisone to area to control itching. 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 yeast etiology. discussed will not treat until Affirm is back due to chronic  history of many medications usage.. Avoid moist clothes or pads for extended period of time. Discussed may also be just vaginal dryness related to menopause. Will discuss treatment when lab in. Instructed can put cool washcloth to area if itching occurs. Patient voiced understanding of plan.  Keep appointment with Dermatology as planned. Keep follow up with oncology as recommended.  Rv prn

## 2015-05-17 ENCOUNTER — Telehealth: Payer: Self-pay

## 2015-05-17 LAB — WET PREP BY MOLECULAR PROBE
CANDIDA SPECIES: NEGATIVE
Gardnerella vaginalis: NEGATIVE
Trichomonas vaginosis: NEGATIVE

## 2015-05-17 NOTE — Telephone Encounter (Signed)
Spoke with patient. Results given. 15 minute phone call with patient. Patient verbalizes understanding of instructions and is able to repeat instructions back to me.  Notes Recorded by Regina Eck, CNM on 05/17/2015 at 7:44 AM Notify patient that her affirm test did not show any yeast, BV or trichomonas! She can apply small amount of her 1 % hydrocortisone cream right in vaginal opening daily for 3 days and this should resolve the problem of itching. No other medication use inside vagina  Routing to provider for final review. Patient agreeable to disposition. Will close encounter.

## 2015-05-18 NOTE — Progress Notes (Signed)
Encounter reviewed [name], MD   

## 2015-05-21 ENCOUNTER — Other Ambulatory Visit: Payer: Self-pay | Admitting: Family Medicine

## 2015-05-22 ENCOUNTER — Telehealth: Payer: Self-pay | Admitting: Certified Nurse Midwife

## 2015-05-22 ENCOUNTER — Ambulatory Visit: Payer: Medicare Other | Admitting: Certified Nurse Midwife

## 2015-05-22 NOTE — Telephone Encounter (Signed)
Spoke with patient. Advised I have spoken with Linda Thompson CNM who recommends that she use hydrocortisone cream externally for 4 days. Patient will need to keep her appointment as scheduled for 06/04/2015 with her dermatologist. Patient is agreeable and verbalizes understanding. Able to repeat instructions back x 3. 9 minute phone call with patient. Appointment for today at 11 am with Linda Thompson CNM cancelled. She is agreeable.  Linda Thompson CNM, do you agree with these recommendations?

## 2015-05-22 NOTE — Telephone Encounter (Signed)
Patient called and was scheduled with DL for today for yeast infection. Per DL please call patient to make sure she needs to be seen today.

## 2015-05-22 NOTE — Telephone Encounter (Signed)
Spoke with patient. Patient states that she is scheduled to see dermatology on 06/04/2015 for follow up. Place patient on hold to speak with [name]. [name] is currently in with a patient. Advised I will need to speak with [name] and return call with further recommendations. She is agreeable.

## 2015-05-22 NOTE — Telephone Encounter (Signed)
When is dermatology appt. If soon she can try hydrocortisone cream thin layer external to see if resolves.

## 2015-05-22 NOTE — Telephone Encounter (Signed)
Spoke with patient. Patient states that she used Hydrocortisone cream x 3 days as instructed on 05/17/2015. Patient reports vaginal itching stopped with use of hydrocortisone cream, but restarted 2 days after she stopped using the cream. States that she is experiencing external vaginal itching. Denies any internal vaginal itching, redness, or discharge. Patient was seen on 05/16/2015 for vaginal itching and affirm testing was negative. Advised I will speak with [name] regarding symptoms and return call with further recommendations regarding the need to keep the appointment or if use of hydrocortisone cream externally for relief of symptoms is advised. She is agreeable and verbalizes understanding.

## 2015-05-22 NOTE — Telephone Encounter (Signed)
Left message to call Linda Thompson at [phone]. 

## 2015-05-23 ENCOUNTER — Ambulatory Visit (INDEPENDENT_AMBULATORY_CARE_PROVIDER_SITE_OTHER): Payer: Medicare Other | Admitting: Podiatry

## 2015-05-23 ENCOUNTER — Encounter: Payer: Self-pay | Admitting: Podiatry

## 2015-05-23 DIAGNOSIS — M79673 Pain in unspecified foot: Secondary | ICD-10-CM | POA: Diagnosis not present

## 2015-05-23 DIAGNOSIS — B351 Tinea unguium: Secondary | ICD-10-CM

## 2015-05-23 DIAGNOSIS — E1140 Type 2 diabetes mellitus with diabetic neuropathy, unspecified: Secondary | ICD-10-CM

## 2015-05-23 NOTE — Progress Notes (Signed)
Patient ID: Linda Thompson, female   DOB: 12-Nov-1955, 60 y.o.   MRN: [id number] Complaint:  Visit Type: Patient returns to my office for continued preventative foot care services. Complaint: Patient states" my nails have grown long and thick and become painful to walk and wear shoes" Patient has been diagnosed with DM with no foot complications. The patient presents for preventative foot care services. No changes to ROS  Podiatric Exam: Vascular: dorsalis pedis and posterior tibial pulses are palpable bilateral. Capillary return is immediate. Temperature gradient is WNL. Skin turgor WNL  Sensorium: Normal Semmes Weinstein monofilament test. Normal tactile sensation bilaterally. Nail Exam: Pt has thick disfigured discolored nails with subungual debris noted bilateral entire nail hallux through fifth toenails Ulcer Exam: There is no evidence of ulcer or pre-ulcerative changes or infection. Orthopedic Exam: Muscle tone and strength are WNL. No limitations in general ROM. No crepitus or effusions noted. Foot type and digits show no abnormalities. Bony prominences are unremarkable. Skin: No Porokeratosis. No infection or ulcers  Diagnosis:  Onychomycosis, , Pain in right toe, pain in left toes  Treatment & Plan Procedures and Treatment: Consent by patient was obtained for treatment procedures. The patient understood the discussion of treatment and procedures well. All questions were answered thoroughly reviewed. Debridement of mycotic and hypertrophic toenails, 1 through 5 bilateral and clearing of subungual debris. No ulceration, no infection noted.  Return Visit-Office Procedure: Patient instructed to return to the office for a follow up visit 3 months for continued evaluation and treatment.   Gardiner Barefoot DPM

## 2015-05-29 ENCOUNTER — Encounter: Payer: Self-pay | Admitting: Family Medicine

## 2015-05-29 ENCOUNTER — Encounter (INDEPENDENT_AMBULATORY_CARE_PROVIDER_SITE_OTHER): Payer: Medicare Other | Admitting: Ophthalmology

## 2015-05-29 DIAGNOSIS — H33302 Unspecified retinal break, left eye: Secondary | ICD-10-CM | POA: Diagnosis not present

## 2015-05-29 DIAGNOSIS — E113212 Type 2 diabetes mellitus with mild nonproliferative diabetic retinopathy with macular edema, left eye: Secondary | ICD-10-CM | POA: Diagnosis not present

## 2015-05-29 DIAGNOSIS — E11311 Type 2 diabetes mellitus with unspecified diabetic retinopathy with macular edema: Secondary | ICD-10-CM

## 2015-05-29 DIAGNOSIS — H43813 Vitreous degeneration, bilateral: Secondary | ICD-10-CM | POA: Diagnosis not present

## 2015-05-29 DIAGNOSIS — H35033 Hypertensive retinopathy, bilateral: Secondary | ICD-10-CM

## 2015-05-29 DIAGNOSIS — I10 Essential (primary) hypertension: Secondary | ICD-10-CM

## 2015-05-29 LAB — HM DIABETES EYE EXAM

## 2015-06-04 DIAGNOSIS — L293 Anogenital pruritus, unspecified: Secondary | ICD-10-CM | POA: Diagnosis not present

## 2015-06-05 NOTE — Telephone Encounter (Signed)
Spoke with patient. Patient states that she was seen at her Dermatologist office yesterday and was advised that her skin "looks a whole lot better." States the Dermatologist told her to use the Hydrocortisone cream only as needed if she has external itching and to return if her symptoms worsen. Patient is asking if this is okay. Advised it is okay to use Hydrocortisone cream as needed, but should not use this daily as it can cause further irritation to the skin. She is agreeable and verbalizes understanding. States she was mainly calling to let us know how her appointment went. Advised I will let [name] know and if she needs anything to return call. She is agreeable.  Routing to provider for final review. Patient agreeable to disposition. Will close encounter.

## 2015-06-05 NOTE — Telephone Encounter (Signed)
Patient wants to discuss her medication with the nurse.

## 2015-06-11 ENCOUNTER — Ambulatory Visit (INDEPENDENT_AMBULATORY_CARE_PROVIDER_SITE_OTHER): Payer: Medicare Other | Admitting: Ophthalmology

## 2015-06-11 DIAGNOSIS — H33302 Unspecified retinal break, left eye: Secondary | ICD-10-CM

## 2015-06-14 ENCOUNTER — Telehealth: Payer: Self-pay | Admitting: Certified Nurse Midwife

## 2015-06-14 NOTE — Telephone Encounter (Signed)
Patient calling. She is asking if it is okay to use Hydrocortisone 2.5% ointment as it helps with itching externally, this was prescribed by PA Benjamin Stain at [facility].  Advised it is okay to use Hydrocortisone cream as needed for itching, but not every day. If she is using it every day, then she needs to call dermatology specialists and have follow up appointment. She is also calling us to let us know she is scheduled to go to [facility] to have a GI procedure. Patient has concerns about her med reconciliation, advised can bring her medications with her and intake nurse will review all of her medications.  She is agreeable and verbalizes understanding.   She will call back to our office if any questions or concerns.  Routing to provider for final review. Patient agreeable to disposition. Will close encounter.

## 2015-06-14 NOTE — Telephone Encounter (Signed)
Patient wants to speak with the nurse about the cream she is using.

## 2015-06-19 ENCOUNTER — Other Ambulatory Visit: Payer: Self-pay | Admitting: *Deleted

## 2015-06-19 MED ORDER — IPRATROPIUM BROMIDE 0.06 % NA SOLN: NASAL | Status: DC

## 2015-06-19 NOTE — Telephone Encounter (Signed)
Rx done. 

## 2015-06-21 ENCOUNTER — Other Ambulatory Visit: Payer: Self-pay | Admitting: Family Medicine

## 2015-06-22 ENCOUNTER — Telehealth: Payer: Self-pay | Admitting: Certified Nurse Midwife

## 2015-06-22 NOTE — Telephone Encounter (Signed)
Left message to call Kaitlyn at [phone]. 

## 2015-06-22 NOTE — Telephone Encounter (Signed)
Spoke with patient. Patient states that she had one episode of external and internal vaginal itching last night. Denies any further itching today or vaginal discharge. Patient is asking if this is okay. Advised it is okay to have vaginal itching on occasion. This does not necessarily mean something is wrong.  If itching is constant or she develops any new symptoms such as discharge or discomfort she will need to be seen for assessment. Patient is agreeable. Reports she is going to be seen in [facility] next week for further evaluation with a doctor regarding polyps found on her colonoscopy and wanted [name] to know. Advised I will let [name] know. Advised if she has any questions or concerns to return call. She is agreeable. 16 minute phone call with patient.  Routing to provider for final review. Patient agreeable to disposition. Will close encounter.

## 2015-06-22 NOTE — Telephone Encounter (Signed)
Patient requesting to speak with the nurse about "itching."

## 2015-06-22 NOTE — Telephone Encounter (Signed)
Return call to kaitlyn  °

## 2015-06-27 DIAGNOSIS — I10 Essential (primary) hypertension: Secondary | ICD-10-CM | POA: Diagnosis not present

## 2015-06-27 DIAGNOSIS — Z7982 Long term (current) use of aspirin: Secondary | ICD-10-CM | POA: Diagnosis not present

## 2015-06-27 DIAGNOSIS — Z7984 Long term (current) use of oral hypoglycemic drugs: Secondary | ICD-10-CM | POA: Diagnosis not present

## 2015-06-27 DIAGNOSIS — K317 Polyp of stomach and duodenum: Secondary | ICD-10-CM | POA: Diagnosis not present

## 2015-06-27 DIAGNOSIS — Z888 Allergy status to other drugs, medicaments and biological substances status: Secondary | ICD-10-CM | POA: Diagnosis not present

## 2015-06-27 DIAGNOSIS — E119 Type 2 diabetes mellitus without complications: Secondary | ICD-10-CM | POA: Diagnosis not present

## 2015-06-27 DIAGNOSIS — Z886 Allergy status to analgesic agent status: Secondary | ICD-10-CM | POA: Diagnosis not present

## 2015-06-27 DIAGNOSIS — D649 Anemia, unspecified: Secondary | ICD-10-CM | POA: Diagnosis not present

## 2015-06-27 DIAGNOSIS — Z880 Allergy status to penicillin: Secondary | ICD-10-CM | POA: Diagnosis not present

## 2015-06-27 DIAGNOSIS — D509 Iron deficiency anemia, unspecified: Secondary | ICD-10-CM | POA: Diagnosis not present

## 2015-06-27 DIAGNOSIS — Z881 Allergy status to other antibiotic agents status: Secondary | ICD-10-CM | POA: Diagnosis not present

## 2015-06-28 ENCOUNTER — Encounter: Payer: Self-pay | Admitting: Podiatry

## 2015-06-28 ENCOUNTER — Telehealth: Payer: Self-pay | Admitting: Certified Nurse Midwife

## 2015-06-28 ENCOUNTER — Ambulatory Visit (INDEPENDENT_AMBULATORY_CARE_PROVIDER_SITE_OTHER): Payer: Medicare Other | Admitting: Podiatry

## 2015-06-28 DIAGNOSIS — L309 Dermatitis, unspecified: Secondary | ICD-10-CM | POA: Diagnosis not present

## 2015-06-28 NOTE — Telephone Encounter (Signed)
Spoke with patient. Patient states that last night she had one episode of vaginal itching. Applied hydrocortisone which relieved itching. Denies any itching today. Asking if it is okay that she used Hydrocortisone cream. Advised it is okay to use Hydrocortisone as needed for itching. Advised since itching has not continued and no other symptoms she does not need to be seen at this time. Advised if itching persists or she develops new symptoms she will need to be seen for evaluation. She is agreeable. Would like [name] to know she will be having intestinal polyps removed with [facility] as one of her polyps is "bleeding." Was started on iron supplements and will continue to follow up with [facility]. 9 minute phone call with patient.  Routing to provider for final review. Patient agreeable to disposition. Will close encounter.

## 2015-06-28 NOTE — Telephone Encounter (Signed)
Patient wants to speak with the nurse. No information given. °

## 2015-06-28 NOTE — Progress Notes (Signed)
Subjective:     Patient ID: Linda Thompson, female   DOB: 1955-10-02, 60 y.o.   MRN: [id number]  HPI this patient presents the office with chief complaint of minor itching on the bottom ball of her left foot. He states that she applied powder in the itching has stopped. She is concerned about the presence in the course of this itching and presents the office today for an evaluation and treatment. She denies any drainage or redness on the bottom of her left foot. She is concerned that the left foot be evaluated prior to her scheduled surgery at [place] [facility]     Objective:   Physical ExamNeurovascular status intact as per last visit.  There is no evidence of redness or swelling or inflammation left ball of foot. Small closed fissure noted ball of left foot.     Assessment:  Dermatitis left foot.     Plan:     ROV  Prescribed cortaid cream to be used daily.   RTC prn.\\\   Gardiner Barefoot DPM

## 2015-06-29 ENCOUNTER — Telehealth: Payer: Self-pay

## 2015-06-29 NOTE — Telephone Encounter (Signed)
Spoke with patient. Patient states that she had one episode of vaginal itching last night. Did not use hydrocortisone cream. Denies any current vaginal itching, irritation, redness, or discharge. Advised it is okay to monitor at this time. If she has intermittent itching she may use hydrocortisone cream externally as needed. If itching persists or she develops any new symptoms such as vaginal discharge, redness, or swelling will need to contact the office to schedule an appointment for evaluation. She is agreeable and verbalizes understanding. 8 minute phone call with patient.  Routing to provider for final review. Patient agreeable to disposition. Will close encounter.

## 2015-07-06 ENCOUNTER — Ambulatory Visit (INDEPENDENT_AMBULATORY_CARE_PROVIDER_SITE_OTHER): Payer: Medicare Other | Admitting: Family Medicine

## 2015-07-06 ENCOUNTER — Encounter: Payer: Self-pay | Admitting: Family Medicine

## 2015-07-06 VITALS — BP 118/84 | HR 89 | Temp 97.7°F | Ht 62.0 in | Wt 193.7 lb

## 2015-07-06 DIAGNOSIS — K317 Polyp of stomach and duodenum: Secondary | ICD-10-CM

## 2015-07-06 DIAGNOSIS — K219 Gastro-esophageal reflux disease without esophagitis: Secondary | ICD-10-CM | POA: Diagnosis not present

## 2015-07-06 DIAGNOSIS — M542 Cervicalgia: Secondary | ICD-10-CM | POA: Diagnosis not present

## 2015-07-06 DIAGNOSIS — K30 Functional dyspepsia: Secondary | ICD-10-CM

## 2015-07-06 NOTE — Progress Notes (Signed)
Pre visit review using our clinic review tool, if applicable. No additional management support is needed unless otherwise documented below in the visit note. 

## 2015-07-06 NOTE — Patient Instructions (Addendum)
Before you leave: -schedule regular follow up in 1 month  For the neck muscle pain: -heat for 15 minutes twice daily -gentle stretching -over the counter sports muscle cream -follow up in 1 month if needed  If upset stomach is persistent, talk with your gastroenterologist.

## 2015-07-06 NOTE — Progress Notes (Signed)
HPI: Acute visit for:  #1 ) upset stomach: -Occurred yesterday, she had several normal bowel movements -Now feels better -Denies diarrhea, melena, vomiting, nausea, fevers, hematochezia -hx hiatal hernia, gastic polyps - large with iron def anemia, gastritis - sees GI --> referred to Dr. Arsenio Loader at [facility] per GI notes -EGD and colonosocpy last done 03/2015 -Per patient is set up for surgery for removal of the polyp in her abdomen  #2) right exterior neck pain: -Slept wrong and woke up with some neck pain 2 days ago, now improving -Mild muscle soreness on the right, worse with certain movements -No radiation, arm pain, weakness or numbness of the arms, fevers, headache or neck stiffness  ROS: See pertinent positives and negatives per HPI.  Past Medical History  Diagnosis Date  . Type II or unspecified type diabetes mellitus without mention of complication, not stated as uncontrolled   . Hypertension   . Hyperlipemia   . GERD (gastroesophageal reflux disease)     w/ hx diaphragmatic hernia  . Sarcoidosis ([place])     difuse bony lesions and LDA, dx by biopsy in 2016  . Osteoarthrosis, unspecified whether generalized or localized, ankle and foot   . DDD (degenerative disc disease), lumbar   . Glaucoma   . IBS (irritable bowel syndrome)   . Meningioma ([place])     ant falx, stable on prior imaging  . Moderate intellectual disabilities   . Transaminitis   . Obesity   . Chronic low back pain   . Allergic rhinitis   . Hx of fracture     R arm, foot  . Heart murmur   . Low back pain 11/22/2012  . GLAUCOMA NOS 03/11/2006    Qualifier: Diagnosis of  By: Diona Browner MD, Amy    . Allergy   . Anemia   . Murmur     Past Surgical History  Procedure Laterality Date  . Total abdominal hysterectomy  03/05/1992    leiomyoma and cellular leiomyoma  . Cataract extraction    . Elbow surgery      right elbow  . Lipoma excision Left [id number]    posterior left axilla  . Colonoscopy   04/15/2011    Family History  Problem Relation Age of Onset  . Diabetes Mother   . Stroke Mother   . Hypertension Mother   . Diabetes Maternal Aunt   . Heart disease Maternal Aunt      (had pacemaker)  . Diabetes Maternal Grandmother   . Stroke Maternal Grandmother   . Hypertension Maternal Grandmother   . Stroke Father   . Colon cancer Neg Hx     Social History   Social History  . Marital Status: Single    Spouse Name: N/A  . Number of Children: 0  . Years of Education: N/A   Occupational History  . Disabled (from arm fracture)    Social History Main Topics  . Smoking status: Never Smoker   . Smokeless tobacco: Never Used  . Alcohol Use: No  . Drug Use: No  . Sexual Activity: Not Currently    Birth Control/ Protection: Surgical     Comment: Hyst--TAH--unsure if has ovaries   Other Topics Concern  . None   Social History Narrative   Lives at [facility] (on [address] [place])      [place] for arm fracture      Mental Retardation, but is able to drive to places she is comfortable with and prepare  her own meals and manage most of her own affairs.      [name] (neighbor) is POA/HCPOA      Reports she gets regular exercise and tries to eat healthy     Current outpatient prescriptions:  .  acetaminophen (TYLENOL) 500 MG tablet, Take 500 mg by mouth as needed (left ear pain). Reported on 04/18/2015, Disp: , Rfl:  .  aspirin EC 81 MG tablet, Take 81 mg by mouth daily. Reported on 04/24/2015, Disp: , Rfl:  .  diltiazem (CARDIZEM CD) 240 MG 24 hr capsule, TAKE ONE CAPSULE BY MOUTH EVERY DAY, Disp: 90 capsule, Rfl: 1 .  ferrous sulfate 325 (65 FE) MG tablet, Take 1 tablet (325 mg total) by mouth 2 (two) times daily with a meal., Disp: 60 tablet, Rfl: 3 .  hydrochlorothiazide (MICROZIDE) 12.5 MG capsule, TAKE 1 CAPSULE BY MOUTH ONCE DAILY, Disp: 90 capsule, Rfl: 1 .  hydrocortisone 2.5 % ointment, APPLY TO AFFECTED AREA  TWICE A DAY, Disp: , Rfl: 0 .  ipratropium (ATROVENT) 0.06 % nasal spray, PLACE 2 SPRAYS INTO BOTH NOSTRILS 4 (FOUR) TIMES DAILY., Disp: 45 mL, Rfl: 3 .  ketoconazole (NIZORAL) 2 % shampoo, APPLY TO AFFECTED AREA TWICE A WEEK AS DIRECTED, Disp: 120 mL, Rfl: 1 .  KLOR-CON M20 20 MEQ tablet, TAKE 1 TABLET BY MOUTH EVERY DAY, Disp: 90 tablet, Rfl: 3 .  Lactobacillus (FLORAJEN ACIDOPHILUS PO), Take 1 tablet by mouth daily., Disp: , Rfl:  .  latanoprost (XALATAN) 0.005 % ophthalmic solution, Place 1 drop into both eyes at bedtime. , Disp: , Rfl:  .  liver oil-zinc oxide (DESITIN) 40 % ointment, Apply 1 application topically as needed for irritation., Disp: , Rfl:  .  loratadine (CLARITIN) 10 MG tablet, Take 10 mg by mouth daily., Disp: , Rfl:  .  metFORMIN (GLUCOPHAGE) 500 MG tablet, TAKE 1 TABLET BY MOUTH TWICE A DAY WITH A MEAL, Disp: 180 tablet, Rfl: 1 .  nystatin (MYCOSTATIN/NYSTOP) 100000 UNIT/GM POWD, Reported on 04/06/2015, Disp: , Rfl:  .  omeprazole (PRILOSEC) 20 MG capsule, TAKE 1 CAPSULE (20 MG TOTAL) BY MOUTH 2 (TWO) TIMES DAILY., Disp: 180 capsule, Rfl: 3 .  pravastatin (PRAVACHOL) 40 MG tablet, TAKE 1 TABLET BY MOUTH EVERY EVENING, Disp: 90 tablet, Rfl: 1 .  [DISCONTINUED] Calcium Carbonate-Vitamin D (CALCIUM-VITAMIN D) 500-200 MG-UNIT per tablet, Take 1 tablet by mouth 2 (two) times daily with a meal.  , Disp: , Rfl:  .  [DISCONTINUED] diltiazem (TIAZAC) 240 MG 24 hr capsule, Take 1 capsule (240 mg total) by mouth daily., Disp: 90 capsule, Rfl: 0 .  [DISCONTINUED] fexofenadine (ALLEGRA) 180 MG tablet, Take 1 tablet (180 mg total) by mouth daily., Disp: 15 tablet, Rfl: 0  EXAM:  Filed Vitals:   07/06/15 1518  BP: 118/84  Pulse: 89  Temp: 97.7 F (36.5 C)    Body mass index is 35.42 kg/(m^2).  GENERAL: vitals reviewed and listed above, alert, oriented, appears well hydrated and in no acute distress  HEENT: atraumatic, conjunttiva clear, no obvious abnormalities on inspection of  external nose and ears  NECK: no obvious masses on inspection  LUNGS: clear to auscultation bilaterally, no wheezes, rales or rhonchi, good air movement  CV: HRRR, no peripheral edema  ABD: Bowel sounds positive, soft, nontender to palpation  MS: moves all extremities without noticeable abnormality, minimal tenderness to palpation in the trapezius muscle mid fibers on the right, normal range of motion of the head and neck, no bony  tenderness to palpation in the cervical or thoracic spine, no neck stiffness  PSYCH: pleasant and cooperative, no obvious depression or anxiety  ASSESSMENT AND PLAN:  Discussed the following assessment and plan:  Upset stomach Gastroesophageal reflux disease, esophagitis presence not specified Gastric polyps -Symptoms minimal and resolved, perhaps not related to her current GI issues -Reviewed her recent GI history and notes from our gastroenterologists -Advised she keep her appointments with her surgeon in follow-up with her gastroenterologist recurrent symptoms  Neck pain -Likely mild neck strain -Advised topical sports creams and gentle stretching  She is due for routine follow-up and will have her schedule this in one month as she is leaving.  -Patient advised to return or notify a doctor immediately if symptoms worsen or persist or new concerns arise.  Patient Instructions  Before you leave: -schedule regular follow up in 1 month  For the neck muscle pain: -heat for 15 minutes twice daily -gentle stretching -over the counter sports muscle cream -follow up in 1 month if needed  If upset stomach is persistent, talk with your gastroenterologist.     [name]

## 2015-07-11 ENCOUNTER — Ambulatory Visit (INDEPENDENT_AMBULATORY_CARE_PROVIDER_SITE_OTHER): Payer: Medicare Other | Admitting: Certified Nurse Midwife

## 2015-07-11 ENCOUNTER — Telehealth: Payer: Self-pay | Admitting: Certified Nurse Midwife

## 2015-07-11 ENCOUNTER — Encounter: Payer: Self-pay | Admitting: Certified Nurse Midwife

## 2015-07-11 VITALS — BP 130/90 | HR 80 | Resp 16 | Ht 62.0 in | Wt 192.8 lb

## 2015-07-11 DIAGNOSIS — R300 Dysuria: Secondary | ICD-10-CM

## 2015-07-11 DIAGNOSIS — N898 Other specified noninflammatory disorders of vagina: Secondary | ICD-10-CM | POA: Diagnosis not present

## 2015-07-11 DIAGNOSIS — N760 Acute vaginitis: Secondary | ICD-10-CM | POA: Diagnosis not present

## 2015-07-11 DIAGNOSIS — N899 Noninflammatory disorder of vagina, unspecified: Secondary | ICD-10-CM | POA: Diagnosis not present

## 2015-07-11 LAB — POCT URINALYSIS DIPSTICK
BILIRUBIN UA: NEGATIVE
Blood, UA: NEGATIVE
Glucose, UA: NEGATIVE
KETONES UA: NEGATIVE
NITRITE UA: NEGATIVE
PROTEIN UA: NEGATIVE
UROBILINOGEN UA: NEGATIVE
pH, UA: 6

## 2015-07-11 NOTE — Telephone Encounter (Signed)
Noted message below. Patient is scheduled for office visit.  Route to provider per Lamont Snowball, RN.  Will close.

## 2015-07-11 NOTE — Progress Notes (Signed)
Reviewed personally.  M. Suzanne Jodee Wagenaar, MD.  

## 2015-07-11 NOTE — Progress Notes (Signed)
60 y.o. Single Caucasian female [name] here with complaint of ? UTI, with onset  3-4 days ago.. Patient not complaining of urinary frequency/urgency/ and pain with urination. Patient denies fever, chills, nausea or back pain. No new personal products. Patient not sexual active. Patient describes burning when urine touches inside vagina only.. Denies other any vaginal symptoms. Continues with antifungal powder as need for yeast dermatitis. Patient wants to be checked for yeast to make sure no problem. Patient had recent colonoscopy with polyps and will need surgery per patient. She has not noted any change any the vulvar mass size.. Menopausal with  vaginal dryness. Patient is consuming adequate water intake.   O: Healthy female WDWN Affect: Normal, orientation x 3 Skin : warm and dry CVAT: negative bilateral Abdomen: slight for suprapubic tenderness  Pelvic exam: External genital area: normal, no lesions, right vulvar mass still palpated, but change in size Bladder,Urethra non tender, Urethral meatus: slightly tender, Vagina: scant vaginal discharge, atrophic appearance, very small superficial excoriation at fourchette on left side noted, tender to touch. Patient acknowledges this is the area that burns when she urinates.  Affirm taken Cervix: absent Uterus:absent Adnexa: absent no fullness or masses   A: Normal pelvic exam TAH due to fibroids with BSO Superficial vaginal excoriation  R/O UTI  R/O vaginal infection Vulva mass felt to be from sarcoid tissue no change in size Limited intellectual ability  poct urine-wbc 1+,ph 6.0  P: Reviewed findings of normal pelvic exam. Reviewed finding of superficial excoriation in vagina,that is being irritated with urine. Patient has her hydrocortisone ointment with her and shown how to use on area bid for 3 days to see if resolves. Voiced understanding of instructions. Lab: Urine culture, Affirm Will treat if indicated.  Discussed importance of  not overusing products which she has done very well with in the past few months. Discussed warning signs of UTI and need to advise. Increase water intake and decrease soda intake to help with burning. [id number] micro, culture Reviewed warning signs and symptoms of UTI and need to advise if occurring. Encouraged to limit soda, tea, and coffee and be sure to increase water intake. Discussed vulva has not changed. Questions addressed.   RV prn 30 minutes spent with patient in face to face discussion of problem in addition to exam.

## 2015-07-11 NOTE — Patient Instructions (Signed)

## 2015-07-11 NOTE — Telephone Encounter (Signed)
Patient was seen today with Linda Thompson, CNM and is asking to talk with her nurse.

## 2015-07-11 NOTE — Telephone Encounter (Signed)
Patient has some type of infection with burning when urinating. I scheduled her for an appointment for today with DL is this ok?

## 2015-07-11 NOTE — Telephone Encounter (Signed)
Call to patient. States she wants Linda Thompson to know that if medication is needed, she needs it to be inexpensive.   Routing to provider for final review. Patient agreeable to disposition. Will close encounter.

## 2015-07-12 LAB — WET PREP BY MOLECULAR PROBE
Candida species: NEGATIVE
Gardnerella vaginalis: NEGATIVE
Trichomonas vaginosis: NEGATIVE

## 2015-07-13 ENCOUNTER — Other Ambulatory Visit: Payer: Self-pay | Admitting: Gastroenterology

## 2015-07-13 ENCOUNTER — Telehealth: Payer: Self-pay | Admitting: Certified Nurse Midwife

## 2015-07-13 MED ORDER — NITROFURANTOIN MONOHYD MACRO 100 MG PO CAPS: 100.0000 mg | ORAL_CAPSULE | Freq: Two times a day (BID) | ORAL | Status: DC

## 2015-07-13 MED ORDER — PHENAZOPYRIDINE HCL 100 MG PO TABS: 100.0000 mg | ORAL_TABLET | Freq: Three times a day (TID) | ORAL | Status: DC | PRN

## 2015-07-13 NOTE — Telephone Encounter (Signed)
Return call to patient after review of urine culture with [name].  Orders received for Macrobid 100 mg po BID for 7 days and Pyridium 100 mg po TID for 4 days. Orders sent to [facility] on file.   Patient states she urinated this morning and noted blood in the toilet as well as on toilet tissue when she wiped, no active or heavy bleeding, does not feel that bleeding is from vagina. She states she is having burning with urination and that she feels about the same as when she was here 07/11/15. Patient continues to deny fever, chills, flank pain, nausea or vomiting. She is reassured multiple times that she was correct for coming to the office when she felt the burning with urination that she had as patient is nervous about upcoming procedures at another hospital being delayed if she has a fever, but she denies fevers or chills. Verbal instructions given regarding medications ordered and to complete entire course of treatment. Patient was able to repeat back instructions for use of Macrobid and Pyridium. Patient advised that pyridium will turn urine orange and not to be alarmed by this. She verbalized understanding.  Advised patient that culture results are just preliminary and may need to change her antibiotics based on final results. She states she understands.   We discussed symptoms that would require her to call the doctor again or go to an emergency room and that is if she starts having worsening bleeding, fevers, chills, nausea, vomiting, back pain or if begins to feel sick and cannot complete her usual daily activities. Patient verbalized understanding of emergency symptoms and she states she knows how to call her neighbor for help if necessary. Encouraged patient to call back with any questions or changes to her condition and also to give [place] update on Monday. She is agreeable.  16 minutes on phone call with patient to give instructions and emergency warning signs.

## 2015-07-13 NOTE — Telephone Encounter (Signed)
Patient says she is seeing blood when she wipe after taking her medication.

## 2015-07-14 LAB — URINE CULTURE

## 2015-07-16 ENCOUNTER — Telehealth: Payer: Self-pay | Admitting: Certified Nurse Midwife

## 2015-07-16 MED ORDER — CIPROFLOXACIN HCL 500 MG PO TABS: 500.0000 mg | ORAL_TABLET | Freq: Two times a day (BID) | ORAL | Status: DC

## 2015-07-16 NOTE — Telephone Encounter (Signed)
-----   Message from Kem Boroughs, [place] sent at 07/16/2015 12:41 PM EDT ----- Patient has negative wet prep and positive urine culture.  The Macrobid that was started is resistant.  She needs to be changed to Cipro 500 mg BID # 14.  Per allergy cross sensitivity chart (see chart) and history of multiple drug allergies she should not have a reaction to Cipro.  However if any SE to call ASAP.

## 2015-07-16 NOTE — Telephone Encounter (Signed)
Noted message from Kem Boroughs, [place]. Macrobid DC in EPIC and order placed for Cipro 500 mg BID for 7 days.  Message left to return call to [place] at [phone].

## 2015-07-16 NOTE — Telephone Encounter (Signed)
Patient returned call and she is advised that she will need to change antibiotics to Cipro 500 mg BID for 7 days. She has taken this before without problems.  She is advised to stop taking Macrobid and she was able to identify the pill bottle and she will dispose of medication. She states she feels a little bit better but she states she feels tired, denies any further vaginal bleeding. She denies fevers or chills and she is drinking fluids and eating diet well. We discussed taking a probiotic with the Cipro and she knows how to spell it and wrote down instructions to take to the pharmacy. She will ask the nurse at her assisted living home to take her temperature. We talked again about emergency symptoms like fevers, chills, nausea, vomiting or bleeding. She verbalized understanding of importance of completing all of her Cipro and I asked patient to call back tomorrow and let us know how she is feeling. She agrees to call back and will schedule follow up at return call. She was able to repeat back instructions for Cipro and emergency symptoms.   18 minutes on phone with patient and instructions.   Routing to provider for final review. Patient agreeable to disposition. Will close encounter.

## 2015-07-16 NOTE — Telephone Encounter (Signed)
Patient called back requesting to speak with nurse about instructions for her medication.

## 2015-07-16 NOTE — Telephone Encounter (Signed)
Spoke with patient. Patient states that she went to the pharmacy and picked up her prescription for Cipro and OTC probiotic. Patient would like to ensure she understands how to take the medication. Advised she will need to take Cipro twice per day once in the morning and once at night and will need to take the probiotic once per day.  Patient is able to repeat back all intructions given. Patient is also asking if she needs to cancel her appointment to see a pediatrist on Wednesday. She is worried taking Cipro will "make my bowels move." Advised the medication does not effect everyone the same. Advised to take Cipro with food. Advised if she develops and GI symptoms such as nausea, vomiting, or diarrhea she will need to contact the office. Patient verbalizes understanding. She will contact the office tomorrow afternoon with an update on how she is feeling.  11 minute phone call with patient.  Routing to provider for final review. Patient agreeable to disposition. Will close encounter.

## 2015-07-16 NOTE — Telephone Encounter (Signed)
Routing to Kem Boroughs, FNP to review and advise as sensitivity results for urine culture have returned and patient was started on Macrobid.

## 2015-07-16 NOTE — Telephone Encounter (Signed)
Patient is calling to speak with the nurse. She states she is a little better but still taking the medication . She would like to speak with the nurse.

## 2015-07-17 ENCOUNTER — Telehealth: Payer: Self-pay | Admitting: Certified Nurse Midwife

## 2015-07-17 NOTE — Telephone Encounter (Signed)
Patient calling requesting to speak with the nurse to give an update on her medication.

## 2015-07-17 NOTE — Telephone Encounter (Signed)
Spoke with patient. Patient states that she started taking Cipro and probiotic last night. Reports she is feeling well. Denies any current urinary symptoms, fever, or chills. Reports she is drinking plenty of water. Advised she will need to continue taking her Cipro bid with food. May take probiotic once per day. Patient is agreeable. Asking if she may take loperamide hydrochloride tomorrow. Patient denies having diarrhea. Advised loperamide hydrochloride is used to help reduce diarrhea and should only be taken if she has loose stool. Patient verbalizes understanding. She is able to repeat all instructions given. She will return call to the office if her symptoms return after completing her Cipro prescription. She is agreeable.  15 minute phone call with patient.   Routing to provider for final review. Patient agreeable to disposition. Will close encounter.

## 2015-07-18 ENCOUNTER — Ambulatory Visit (INDEPENDENT_AMBULATORY_CARE_PROVIDER_SITE_OTHER): Payer: Medicare Other | Admitting: Podiatry

## 2015-07-18 ENCOUNTER — Encounter: Payer: Self-pay | Admitting: Podiatry

## 2015-07-18 ENCOUNTER — Telehealth: Payer: Self-pay | Admitting: Certified Nurse Midwife

## 2015-07-18 DIAGNOSIS — L600 Ingrowing nail: Secondary | ICD-10-CM

## 2015-07-18 DIAGNOSIS — Z7689 Persons encountering health services in other specified circumstances: Secondary | ICD-10-CM

## 2015-07-18 DIAGNOSIS — L03032 Cellulitis of left toe: Secondary | ICD-10-CM

## 2015-07-18 NOTE — Telephone Encounter (Signed)
Patient has questions about her medication.  °

## 2015-07-18 NOTE — Telephone Encounter (Signed)
Spoke with patient. Patient states that when she woke up this morning she had a bowel movement before breakfast. Reports bowel movement was solid and "normal," Denies any diarrhea, nausea, vomiting, or stomach pain. Patient is asking "Is this normal?" Advised this is normal and we expect for her to continue to have normal bowel movements. If she develops any stomach discomfort or diarrhea while taking Cipro she will need to notify the office. Patient verbalizes understanding. Reports she is feeling much better. Verbalized instructions on how to take Cipro bid with food. 11 minute phone call with patient.  Routing to provider for final review. Patient agreeable to disposition. Will close encounter.

## 2015-07-18 NOTE — Progress Notes (Signed)
Subjective:     Patient ID: Linda Thompson, female   DOB: Sep 20, 1955, 60 y.o.   MRN: [id number]  HPI this patient presents to the office with chief complaint of pain and bleeding from the big toe left foot. She states that it started bleeding this past Sunday and she became concerned about the toe since she is a diabetic. She states another individual*the bleeding and recommended she be seen by her foot doctor. She presents the office today for an evaluation and treatment of this problem   Review of Systems     Objective:   Physical Exam neurovascular status is intact. There is marked incurvation noted along the lateral border of the left great toe hematoma is noted along the nail groove from previous bleeding     Assessment:    Paronychia left hallux    Plan:     ROV  Incision and drainage of the lateral border of the left great toe. Neosporin and dry sterile dressing return to the office when necessary  Gardiner Barefoot DPM

## 2015-07-19 ENCOUNTER — Telehealth: Payer: Self-pay | Admitting: *Deleted

## 2015-07-19 ENCOUNTER — Encounter: Payer: Self-pay | Admitting: Obstetrics & Gynecology

## 2015-07-19 ENCOUNTER — Telehealth: Payer: Self-pay | Admitting: Emergency Medicine

## 2015-07-19 ENCOUNTER — Ambulatory Visit (INDEPENDENT_AMBULATORY_CARE_PROVIDER_SITE_OTHER): Payer: Medicare Other | Admitting: Obstetrics & Gynecology

## 2015-07-19 VITALS — BP 116/78 | HR 80 | Temp 97.8°F | Ht 62.0 in | Wt 190.0 lb

## 2015-07-19 DIAGNOSIS — N3090 Cystitis, unspecified without hematuria: Secondary | ICD-10-CM

## 2015-07-19 DIAGNOSIS — T50905A Adverse effect of unspecified drugs, medicaments and biological substances, initial encounter: Secondary | ICD-10-CM

## 2015-07-19 DIAGNOSIS — T887XXA Unspecified adverse effect of drug or medicament, initial encounter: Secondary | ICD-10-CM | POA: Diagnosis not present

## 2015-07-19 NOTE — Telephone Encounter (Signed)
Returned patient called and she wanted to know if Dr. Sabra Heck would be calling her in a medication after her U/C came back. She was advised again that if a medication was needed we would let her know. She is very concerned about the cost. She is requesting a "cheap" medication if she needs something.

## 2015-07-19 NOTE — Telephone Encounter (Signed)
Just a note from the patient  Patient was seen this morning with Dr. Sabra Heck. She said that if her urine culture comes back and she needs another antibiotic that she would like something cheap. She can't afford very much.

## 2015-07-19 NOTE — Progress Notes (Signed)
GYNECOLOGY  VISIT   HPI:  60 y.o. [name] Single Caucasian female here with complaint of abdominal pain and "not feeling right" since starting the Ciprofloxin that was prescribed for a UTI.  Pt was seen about 8 days ago with complaint of dysuria and noting some blood on toilet tissue when she wiped. She was started on Macrobid but the urine culture showed resistance to this so she was changed to Ciprofloxin.  She does not have any of these symptoms now.  She has taken three days of the ciprofloxin.  She is also using an probiotic.  Denies fever or back pain.  Pt reports issues with GI distress and "not feeling right" with the Cipro.  She has not had frank diarrhea.  She does have IBS and can go several days without a BM, then have three or four in a day.  She had three soft BMs yesterday, which can be normal for her.  Again, no fevers.  No nausea.  No emesis.  She called and was advised to be seen.  Pt typically needs a lot of reassurance.  Reviewed history and results with pt.   GYNECOLOGIC HISTORY: Patient's last menstrual period was 01/21/1992 (approximate).  Patient Active Problem List   Diagnosis Date Noted  . Sarcoidosis ([place]) 10/27/2014  . Obesity, morbid ([place]) 05/05/2011  . Irritable bowel syndrome 03/05/2010  . Diabetes ([place]) 08/13/2007  . ALLERGIC  RHINITIS 09/23/2006  . Hyperlipemia 03/11/2006  . MENTAL RETARDATION, MODERATE 03/11/2006  . GLAUCOMA NOS 03/11/2006  . Essential hypertension 03/11/2006  . GERD 03/11/2006  . MENINGIOMA 12/20/2001    Past Medical History  Diagnosis Date  . Type II or unspecified type diabetes mellitus without mention of complication, not stated as uncontrolled   . Hypertension   . Hyperlipemia   . GERD (gastroesophageal reflux disease)     w/ hx diaphragmatic hernia  . Sarcoidosis ([place])     difuse bony lesions and LDA, dx by biopsy in 2016  . Osteoarthrosis, unspecified whether generalized or localized, ankle and foot   . DDD (degenerative  disc disease), lumbar   . Glaucoma   . IBS (irritable bowel syndrome)   . Meningioma ([place])     ant falx, stable on prior imaging  . Moderate intellectual disabilities   . Transaminitis   . Obesity   . Chronic low back pain   . Allergic rhinitis   . Hx of fracture     R arm, foot  . Heart murmur   . Low back pain 11/22/2012  . GLAUCOMA NOS 03/11/2006    Qualifier: Diagnosis of  By: Diona Browner MD, Amy    . Allergy   . Anemia   . Murmur     Past Surgical History  Procedure Laterality Date  . Total abdominal hysterectomy  03/05/1992    leiomyoma and cellular leiomyoma  . Cataract extraction    . Elbow surgery      right elbow  . Lipoma excision Left [id number]    posterior left axilla  . Colonoscopy  04/15/2011    MEDS:  Reviewed in EPIC and UTD  ALLERGIES: Ace inhibitors; Amoxicillin-pot clavulanate; Augmentin; Azithromycin; Ceftin; Cefuroxime; Cyclobenzaprine hcl; Flexeril; Flonase; Fluticasone; and Ibuprofen  Family History  Problem Relation Age of Onset  . Diabetes Mother   . Stroke Mother   . Hypertension Mother   . Diabetes Maternal Aunt   . Heart disease Maternal Aunt      (had pacemaker)  . Diabetes Maternal Grandmother   .  Stroke Maternal Grandmother   . Hypertension Maternal Grandmother   . Stroke Father   . Colon cancer Neg Hx     SH:  Single, non smoker  Review of Systems  All other systems reviewed and are negative.   PHYSICAL EXAMINATION:    BP 116/78 mmHg  Pulse 80  Temp(Src) 97.8 F (36.6 C) (Oral)  Ht 5\' 2"  (1.575 m)  Wt 190 lb (86.183 kg)  BMI 34.74 kg/m2  LMP 01/21/1992 (Approximate)    General appearance: alert, cooperative and appears stated age CV:  Regular rate and rhythm Lungs:  clear to auscultation, no wheezes, rales or rhonchi, symmetric air entry Abdomen: soft, non-tender; bowel sounds normal; no masses,  no organomegaly, no suprapubic pain Flank:  No CVS tenderness  Chaperone was present for exam.  Assessment: Proteus  mirabilis UTI, probably treated with 3 days of Ciprofloxin GI distress with ciprofloxacin, no diarrhea  Plan: Repeat Urine culture today  Pt advised to stop antibiotics for now and will see if culture is negative.  Likely, she has been treated as she took 3 days of the antibiotics Warning signs and symptoms for C diff reviewed

## 2015-07-19 NOTE — Telephone Encounter (Signed)
Patient is calling again asking to talk with triage.

## 2015-07-19 NOTE — Telephone Encounter (Signed)
Call to patient and she is asked to come today at 1215 for check in. She is agreeable.

## 2015-07-19 NOTE — Telephone Encounter (Signed)
Patient states her new medication is giving her nausea and diarrhea, weakness. Needs call from the nurse.

## 2015-07-19 NOTE — Telephone Encounter (Signed)
0909: Return call to patient. She initially states "I feel fine." But then states "I feel weak and I think it's the medicine." She denies fevers, nausea, vomiting or diarrhea during triage, however, she reported these symptoms to reception at incoming phone call.  Patient states she is eating but not as much as she normally does and she is taking probiotic as we discussed. She is tolerating fluids and water "when I have to." She denies any hematuria or back pain. She states "I just have to walk really slow" but confirms this is not from foot procedure yesterday.  Patient states she has not taken her dose of Cipro today because she thinks it is making her feel so bad.  Was seen by podiatry yesterday visit in [place]. She was not started on any medication after Incision and drainage of toe.  Patient has called each day this week and spoke with triage RN, encouraged patient that if she still is not feeling well she needs to come to office, either PCP or here. She is agreeable to appointment and states she really wants to feel better for her GI procedure at [facility]. Patient states she is driving herself and she feels she can do so safely. Advised patient if any problems while driving to pull to the side of the road and call 911, she verbalizes understanding.  15 minutes for telephone triage and office visit scheduled after review with Lamont Snowball, RN clinical supervisor and patient to be seen by Dr. Sabra Heck today at 1245. Return call to patient and she is agreeable.   Routing to Dr. Sabra Heck and will close encounter.

## 2015-07-19 NOTE — Telephone Encounter (Signed)
Thank you for the information.  Encounter closed. 

## 2015-07-20 ENCOUNTER — Telehealth: Payer: Self-pay | Admitting: Certified Nurse Midwife

## 2015-07-20 LAB — URINE CULTURE
COLONY COUNT: NO GROWTH
Organism ID, Bacteria: NO GROWTH

## 2015-07-20 NOTE — Telephone Encounter (Signed)
Patient is asking to talk with Regina Eck nurse. (see tc note from 07/19/15).

## 2015-07-20 NOTE — Telephone Encounter (Signed)
Spoke with patient she c/o one day of external vaginal itching. Calling to ask if she can use Hydorcortisone ointment. Advised yes she can, use no more than two times per day. If itching continues as patient is diabetic and has recurrent skin irritation issues, advised to call back on Monday if still itching for office visit.  Patient states she feels much improved after stopping Cipro. No fevers or diarrhea.  She is glad she is getting this taken care of before procedure at [facility] in [place].   Advised will call back with culture results when they are available. Patient agreeable.  Routing to provider for final review. Patient agreeable to disposition. Will close encounter.

## 2015-07-23 ENCOUNTER — Telehealth: Payer: Self-pay | Admitting: Obstetrics & Gynecology

## 2015-07-23 NOTE — Telephone Encounter (Signed)
I agree with your recommendations.  OK to close encounter.

## 2015-07-23 NOTE — Telephone Encounter (Signed)
Patient is asking to talk with Regina Eck nurse. Patient did not give any details.

## 2015-07-23 NOTE — Telephone Encounter (Signed)
Patient states she is having intermittent vaginal itching externally. She has not used any creams, ointments or powders since we last talked on Friday.  Itching is intermittent.  Advised can use hydrocortisone ointment as needed but only very small amounts and not every day. If continues itching, needs office visit for evaluation. Advised patient to try not to itch area if possible.  Patient states she thinks it may be her nerves because of upcoming surgery on 08/17/15. Support given and we discussed good skin care. Patient verbalized understanding. She is advised to call back if itching continues and she is agreeable.  Routing to Dr. Quincy Simmonds to review.

## 2015-07-25 ENCOUNTER — Ambulatory Visit (INDEPENDENT_AMBULATORY_CARE_PROVIDER_SITE_OTHER): Payer: Medicare Other | Admitting: Certified Nurse Midwife

## 2015-07-25 ENCOUNTER — Encounter: Payer: Self-pay | Admitting: Certified Nurse Midwife

## 2015-07-25 VITALS — BP 114/74 | HR 70 | Resp 16 | Ht 62.0 in | Wt 191.0 lb

## 2015-07-25 DIAGNOSIS — B373 Candidiasis of vulva and vagina: Secondary | ICD-10-CM | POA: Diagnosis not present

## 2015-07-25 DIAGNOSIS — B3731 Acute candidiasis of vulva and vagina: Secondary | ICD-10-CM

## 2015-07-25 MED ORDER — FLUCONAZOLE 150 MG PO TABS: ORAL_TABLET | ORAL | Status: DC

## 2015-07-25 NOTE — Patient Instructions (Signed)

## 2015-07-25 NOTE — Progress Notes (Signed)
60 y.o. Single Caucasian female [name] here with complaint of vaginal symptoms of itching, burning, and increase discharge. Describes discharge as white, thick. Patient was on Macrobid for UTI and was changed to Cipro due to culture results. Had negative culture at follow up here.. Onset of symptoms 2-3 days ago. Denies new personal products or vaginal dryness or other medication use. Only using water to clean with in vaginal area now and nystatin powder in groin creases for control.. Has surgery schedule for colon polyps on 08/15/15. No other  Problems today. Menopausal, no vaginal bleeding   O:Healthy female WDWN Affect: normal, orientation x 3 limited intellectual ability  Exam: Abdomen:soft, non tender, negative suprapubic  Inguinal Lymph node: no enlargement or tenderness Pelvic exam: External genital: normal female with enlarged right labia due to mass related to sarcoidosis ,no scaling or exudate noted BUS: negative Bladder and urethra non tender, urethral meatus non tender or red. Vagina: white thick non odorous discharge noted. Ph:4.0   ,Wet prep taken Cervix: absent Uterus: abent Adnexa: no masses or fullness noted   Wet Prep results: KOH, Saline  Yeast noted    A:Normal pelvic exam No indication of UTI Yeast vaginitis   P:Discussed findings of yeast vaginitis and etiology. Discussed Aveeno or baking soda mixed in cup with water for use for  Comfort when voiding.. Avoid moist clothes or pads for extended period of time.  Avoid any new product use other than what discussed.  Rx: Diflucan see order with instructions  Rv prn

## 2015-07-25 NOTE — Progress Notes (Signed)
Reviewed personally.  M. Suzanne Tausha Milhoan, MD.  

## 2015-07-26 ENCOUNTER — Telehealth: Payer: Self-pay | Admitting: Certified Nurse Midwife

## 2015-07-26 NOTE — Telephone Encounter (Signed)
Patient calling with questions about "an over the counter medication I forgot the name of." She requested to speak with the nurse.

## 2015-07-26 NOTE — Telephone Encounter (Signed)
Patient calling and wants to make sure that OTC Diarrhea medication (immodium) is on her medication list because she is using it as needed for diarrhea with her IBS.  She is aware to not use this every days. She uses it if she is going to out to eat and may have stomach upset.  Denies current diarrhea. No fevers or diarrhea.  Added imodium to her medication list.   Encouraged patient that she is doing a good job with care of herself and to call us if any further questions.

## 2015-07-27 ENCOUNTER — Telehealth: Payer: Self-pay | Admitting: Certified Nurse Midwife

## 2015-07-27 NOTE — Telephone Encounter (Signed)
Patient calling to verify how she is suppose to take the Diflucan. She was advised that she needs to take the second dose on Monday 07-30-15. Patient voiced understanding.

## 2015-07-27 NOTE — Telephone Encounter (Signed)
Patient calling with questions about her medication  °

## 2015-08-01 ENCOUNTER — Telehealth: Payer: Self-pay | Admitting: Emergency Medicine

## 2015-08-01 NOTE — Telephone Encounter (Signed)
Patient calling with complaints of vaginal irritation. Took Diflucan 150 mg po x 1 one 07/25/15 on one on Monday 07/30/15. C/o intermittent internal vaginal itching. No discharge. No fevers or pain.  Reviewed with [name]. Patient can start Hydrocortisone ointment BID for two days and then office visit on Friday if not improved.  Return call to patient. She is agreeable. Friday appointment scheduled for patient at 1300. She will call back if any worsening symptoms.  Routing to provider for final review. Patient agreeable to disposition. Will close encounter.

## 2015-08-02 ENCOUNTER — Telehealth: Payer: Self-pay | Admitting: Certified Nurse Midwife

## 2015-08-02 NOTE — Telephone Encounter (Signed)
Patient is calling to speak with nurse about her appointment tomorrow. Having some additional issues she would like to discuss.

## 2015-08-02 NOTE — Telephone Encounter (Signed)
Spoke with patient. Patient states that she has had an upset stomach today and took an Imodium. Reports she is feeling better now, but is concerned about her appointment that is scheduled for tomorrow with [name]. Worried she may still have an upset stomach. Denies any nausea, vomiting, or fever. Reports she is still having intermittent vaginal itching after taking Diflucan. Advised it is recommended that she keep her appointment for a recheck tomorrow due to symptoms continuing. Advised if she has an upset stomach tomorrow morning she will need to contact the office. She is agreeable.  Routing to provider for final review. Patient agreeable to disposition. Will close encounter.

## 2015-08-03 ENCOUNTER — Ambulatory Visit (INDEPENDENT_AMBULATORY_CARE_PROVIDER_SITE_OTHER): Payer: Medicare Other | Admitting: Certified Nurse Midwife

## 2015-08-03 ENCOUNTER — Encounter: Payer: Self-pay | Admitting: Certified Nurse Midwife

## 2015-08-03 VITALS — BP 116/68 | HR 70 | Resp 16 | Ht 62.0 in | Wt 191.0 lb

## 2015-08-03 DIAGNOSIS — B3731 Acute candidiasis of vulva and vagina: Secondary | ICD-10-CM

## 2015-08-03 DIAGNOSIS — B373 Candidiasis of vulva and vagina: Secondary | ICD-10-CM

## 2015-08-03 NOTE — Progress Notes (Signed)
60 y.o. Single Caucasian female [name] here with complaint of vaginal symptoms of itching only in vulva area under clitoral hood..was treated for yeast vaginitis with Diflucan on 07/25/15. Patient used medication as prescribed and all symptoms resolved except this area. Patient has occasional incontinence and wears depends. Onset of symptoms 2-3 days ago. Denies new personal products or vaginal dryness. Using Nystatin powder in groin area for yeast prevention with good results. Denies any urinary burning or change. Patient bathes daily and goes without underwear when she is home. No other problems today.   O:Healthy female WDWN Affect: normal, orientation x 3  Exam: Abdomen: soft, non tender  Inguinal Lymph nodes: no enlargement or tenderness Pelvic exam: External genital: normal female, slight increase pink and moisture noted under clitoral hood area, where patient points to area of concern. Slight urine smell noted. No excoriations noted. Slight exudate. Wet prep taken. BUS: negative Vagina: normal discharge noted.    ,Wet prep taken Cervix: absent Uterus: absent  Adnexa:absent, non tender, no masses or fullness not  Wet prep: KOH, Saline positive for yeast vulva only  A:Normal pelvic exam Yeast vulvitis only in clitoral hood area, probably from moisture with urine.   P:Discussed findings of yeast in this area only and etiology.  Discussed patting the area after urination to keep area dry. Patient has Nystatin powder she uses daily in groin area. Instructed to apply to the area of concern twice daily x 1 week and the once daily after bath only. Shown with her powder how much and how to apply. Patient demonstrated appropriate application and amount.  Will call if not resolving.   Rv prn

## 2015-08-06 NOTE — Progress Notes (Signed)
Encounter reviewed [name], MD   

## 2015-08-07 DIAGNOSIS — M545 Low back pain: Secondary | ICD-10-CM | POA: Diagnosis not present

## 2015-08-07 DIAGNOSIS — M25552 Pain in left hip: Secondary | ICD-10-CM | POA: Diagnosis not present

## 2015-08-08 ENCOUNTER — Telehealth: Payer: Self-pay | Admitting: Certified Nurse Midwife

## 2015-08-08 NOTE — Telephone Encounter (Signed)
Patient has some questions about the instructions Evalee Mutton, CNM gave her at her last appointment.

## 2015-08-08 NOTE — Telephone Encounter (Signed)
Spoke with patient and discussed directions for her for Nystatin powder.  She verbalized understanding and will follow up as needed. Denies complaints at this time, she just wanted to ensure she was using per instructions.

## 2015-08-13 ENCOUNTER — Telehealth: Payer: Self-pay

## 2015-08-13 ENCOUNTER — Telehealth: Payer: Self-pay | Admitting: Certified Nurse Midwife

## 2015-08-13 ENCOUNTER — Ambulatory Visit: Payer: Medicare Other | Admitting: Family Medicine

## 2015-08-13 NOTE — Telephone Encounter (Signed)
Patient is having some vaginal itching.

## 2015-08-13 NOTE — Telephone Encounter (Signed)
Pt is scheduled with Dr. Maudie Mercury on 08/14/2015 @ 08:45am for ringing in ears.  Patient Name: Linda Thompson Gender: Female DOB: [birth year] Age: 60 Y 25 D Return Phone Number: [phone] (Primary) Address: [address] City/State/Zip: [name] [address] Client [facility] Night - Client Client Site [facility] - Night Physician [name]- MD Contact Type Call Who Is Calling Patient / Member / Family / Caregiver Call Type Triage / Clinical Relationship To Patient Self Return Phone Number [phone] (Primary) Chief Complaint Earache Reason for Call Symptomatic / Request for Health Information Initial Comment Caller states right ear rings off and on. PreDisposition Did not know what to do Translation No Nurse Assessment Nurse: Robby Sermon, RN, April Date/Time (Eastern Time): 08/11/2015 7:22:40 AM Confirm and document reason for call. If symptomatic, describe symptoms. You must click the next button to save text entered. ---Caller states she hears ringing in her right ear, occasionally the left but mostly the right. She is supposed to have surgery next Friday. She does take high blood pressure medicine. She has no fever, no other symptoms. Has the patient traveled out of the country within the last 30 days? ---No Does the patient have any new or worsening symptoms? ---Yes Will a triage be completed? ---Yes Related visit to physician within the last 2 weeks? ---No Does the PT have any chronic conditions? (i.e. diabetes, asthma, etc.) ---Yes List chronic conditions. ---Hypertension, Diabetic- Metformin Is this a behavioral health or substance abuse call? ---No Guidelines Guideline Title Affirmed Question Affirmed Notes Nurse Date/Time (Eastern Time) Tinnitus MODERATE-SEVERE tinnitus (i.e., interferes with work, school, or sleep) Robby Sermon, RN, April 08/11/2015 7:25:33 AM Disp. Time Eilene Ghazi Time) Disposition Final User 08/11/2015 7:29:02  AM See PCP When Office is Open (within 3 days) Yes Hubbard,RN, April 08/11/2015 7:25:33 AM Disp. Time Eilene Ghazi Time) Disposition Final User 08/11/2015 7:29:02 AM See PCP When Office is Open (within 3 days) Yes Robby Sermon, RN, April Caller Understands: Yes Disagree/Comply: Comply Care Advice Given Per Guideline SEE PCP WITHIN 3 DAYS: * You need to be seen within 2 or 3 days. Call your doctor during regular office hours and make an appointment. An urgent care center is often the best source of care if your doctor's office is closed or you can't get an appointment. NOTE: If office will be open tomorrow, tell caller to call then, not in 3 days. BRING MEDICINES: * Please bring a list of your current medicines when you go to see the doctor. AVOID LOUD NOISE: AVOID CAFFEINE, ASPIRIN, AND ALCOHOL. These can make tinnitus worse. USE BACKGROUND NOISE TO DECREASE TINNITUS: CALL BACK IF: * Earache occurs * You become worse. Referrals REFERRED TO PCP OFFICE REFERRED TO PCP OFFICE

## 2015-08-14 ENCOUNTER — Encounter: Payer: Self-pay | Admitting: Family Medicine

## 2015-08-14 ENCOUNTER — Ambulatory Visit (INDEPENDENT_AMBULATORY_CARE_PROVIDER_SITE_OTHER): Payer: Medicare Other | Admitting: Family Medicine

## 2015-08-14 VITALS — BP 124/86 | HR 98 | Temp 97.6°F | Ht 62.0 in | Wt 192.9 lb

## 2015-08-14 DIAGNOSIS — E119 Type 2 diabetes mellitus without complications: Secondary | ICD-10-CM | POA: Diagnosis not present

## 2015-08-14 DIAGNOSIS — H9313 Tinnitus, bilateral: Secondary | ICD-10-CM

## 2015-08-14 DIAGNOSIS — I10 Essential (primary) hypertension: Secondary | ICD-10-CM | POA: Diagnosis not present

## 2015-08-14 DIAGNOSIS — E785 Hyperlipidemia, unspecified: Secondary | ICD-10-CM

## 2015-08-14 LAB — MICROALBUMIN / CREATININE URINE RATIO
Creatinine,U: 73.3 mg/dL
Microalb Creat Ratio: 1.8 mg/g (ref 0.0–30.0)
Microalb, Ur: 1.3 mg/dL (ref 0.0–1.9)

## 2015-08-14 LAB — POCT GLYCOSYLATED HEMOGLOBIN (HGB A1C): Hemoglobin A1C: 6.7

## 2015-08-14 NOTE — Progress Notes (Signed)
HPI:   Acute visit for Tinnitus: -occurs rarely, will very occasionally here a brief ringing in ears  -no pulsatile tinnitus, hearing loss, ear pain, fevers, SOB, sinus pain -does have intermittent mild nasal congestion and sneezing with PND c/w mild allergic rhinitis  ROS: See pertinent positives and negatives per HPI.  Past Medical History:  Diagnosis Date  . Allergic rhinitis   . Allergy   . Anemia   . Chronic low back pain   . DDD (degenerative disc disease), lumbar   . GERD (gastroesophageal reflux disease)    w/ hx diaphragmatic hernia  . Glaucoma   . GLAUCOMA NOS 03/11/2006   Qualifier: Diagnosis of  By: Diona Browner MD, Amy    . Heart murmur   . Hx of fracture    R arm, foot  . Hyperlipemia   . Hypertension   . IBS (irritable bowel syndrome)   . Low back pain 11/22/2012  . Meningioma ([place])    ant falx, stable on prior imaging  . Moderate intellectual disabilities   . Murmur   . Obesity   . Osteoarthrosis, unspecified whether generalized or localized, ankle and foot   . Sarcoidosis ([place])    difuse bony lesions and LDA, dx by biopsy in 2016  . Transaminitis   . Type II or unspecified type diabetes mellitus without mention of complication, not stated as uncontrolled     Past Surgical History:  Procedure Laterality Date  . CATARACT EXTRACTION    . COLONOSCOPY  04/15/2011  . ELBOW SURGERY     right elbow  . LIPOMA EXCISION Left [id number]   posterior left axilla  . TOTAL ABDOMINAL HYSTERECTOMY  03/05/1992   leiomyoma and cellular leiomyoma    Family History  Problem Relation Age of Onset  . Diabetes Mother   . Stroke Mother   . Hypertension Mother   . Diabetes Maternal Aunt   . Heart disease Maternal Aunt      (had pacemaker)  . Diabetes Maternal Grandmother   . Stroke Maternal Grandmother   . Hypertension Maternal Grandmother   . Stroke Father   . Colon cancer Neg Hx     Social History   Social History  . Marital status: Single    Spouse name: N/A   . Number of children: 0  . Years of education: N/A   Occupational History  . Disabled (from arm fracture)    Social History Main Topics  . Smoking status: Never Smoker  . Smokeless tobacco: Never Used  . Alcohol use No  . Drug use: No  . Sexual activity: Not Currently    Birth control/ protection: Surgical     Comment: Hyst--TAH--unsure if has ovaries   Other Topics Concern  . None   Social History Narrative   Lives at [facility] (on [address] in [place])      [place] for [place] fracture      Mental Retardation, but is able to drive to places she is comfortable with and prepare her own meals and manage most of her own affairs.      [name] (neighbor) is POA/HCPOA      Reports she gets regular exercise and tries to eat healthy     Current Outpatient Prescriptions:  .  acetaminophen (TYLENOL) 500 MG tablet, Take 500 mg by mouth as needed (left ear pain). Reported on 04/18/2015, Disp: , Rfl:  .  aspirin EC 81 MG tablet, Take 81 mg by mouth daily. Reported on 04/24/2015,  Disp: , Rfl:  .  diltiazem (CARDIZEM CD) 240 MG 24 hr capsule, TAKE ONE CAPSULE BY MOUTH EVERY DAY, Disp: 90 capsule, Rfl: 1 .  ferrous sulfate 325 (65 FE) MG tablet, TAKE 1 TABLET (325 MG TOTAL) BY MOUTH 2 (TWO) TIMES DAILY WITH A MEAL., Disp: 60 tablet, Rfl: 2 .  hydrochlorothiazide (MICROZIDE) 12.5 MG capsule, TAKE 1 CAPSULE BY MOUTH ONCE DAILY, Disp: 90 capsule, Rfl: 1 .  hydrocortisone 2.5 % ointment, APPLY TO AFFECTED AREA TWICE A DAY, Disp: , Rfl: 0 .  ipratropium (ATROVENT) 0.06 % nasal spray, PLACE 2 SPRAYS INTO BOTH NOSTRILS 4 (FOUR) TIMES DAILY., Disp: 45 mL, Rfl: 3 .  ketoconazole (NIZORAL) 2 % shampoo, APPLY TO AFFECTED AREA TWICE A WEEK AS DIRECTED, Disp: 120 mL, Rfl: 1 .  KLOR-CON M20 20 MEQ tablet, TAKE 1 TABLET BY MOUTH EVERY DAY, Disp: 90 tablet, Rfl: 3 .  Lactobacillus (FLORAJEN ACIDOPHILUS PO), Take 1 tablet by mouth daily., Disp: , Rfl:  .   latanoprost (XALATAN) 0.005 % ophthalmic solution, Place 1 drop into both eyes at bedtime. , Disp: , Rfl:  .  liver oil-zinc oxide (DESITIN) 40 % ointment, Apply 1 application topically as needed for irritation., Disp: , Rfl:  .  loperamide (IMODIUM) 2 MG capsule, Take 2 mg by mouth as needed for diarrhea or loose stools (PRN diarrhea)., Disp: , Rfl:  .  loratadine (CLARITIN) 10 MG tablet, Take 10 mg by mouth daily., Disp: , Rfl:  .  metFORMIN (GLUCOPHAGE) 500 MG tablet, TAKE 1 TABLET BY MOUTH TWICE A DAY WITH A MEAL, Disp: 180 tablet, Rfl: 1 .  omeprazole (PRILOSEC) 20 MG capsule, TAKE 1 CAPSULE (20 MG TOTAL) BY MOUTH 2 (TWO) TIMES DAILY., Disp: 180 capsule, Rfl: 3 .  pravastatin (PRAVACHOL) 40 MG tablet, TAKE 1 TABLET BY MOUTH EVERY EVENING, Disp: 90 tablet, Rfl: 1 .  Propylene Glycol (SYSTANE BALANCE) 0.6 % SOLN, , Disp: , Rfl:   EXAM:  Vitals:   08/14/15 0835  BP: 124/86  Pulse: 98  Temp: 97.6 F (36.4 C)    Body mass index is 35.28 kg/m.  GENERAL: vitals reviewed and listed above, alert, oriented, appears well hydrated and in no acute distress  HEENT: atraumatic, conjunttiva clear, no obvious abnormalities on inspection of external nose and ears, normal appearance of ear canals and TMs, clear nasal congestion, mild post oropharyngeal erythema with PND, no tonsillar edema or exudate, no sinus TTP  NECK: no obvious masses on inspection  LUNGS: clear to auscultation bilaterally, no wheezes, rales or rhonchi, good air movement  CV: HRRR, no peripheral edema  MS: moves all extremities without noticeable abnormality  PSYCH: pleasant and cooperative, no obvious depression or anxiety  ASSESSMENT AND PLAN:  Discussed the following assessment and plan:  Tinnitus, bilateral  Type 2 diabetes mellitus without complication, without long-term current use of insulin (HCC) - Plan: Hemoglobin A1c, Microalbumin/Creatinine Ratio, Urine  Hyperlipemia  Essential hypertension  Morbid  obesity, unspecified obesity type ([place])  -suspect ear issues from mild AR, suspect issues 2ndary to this -advised ENT/audiology eval if persistent, worsening or hearing issues -diabetes labs due today -will need routine follow up in 3 months -of course, we advised to return or notify a doctor immediately if symptoms worsen or persist or new concerns arise.  She has a number of chronic issues but rarely comes in for evaluation. Seeing GI for anemia and gastric polyps. Needs labs for diabetes. Good with taking her diabetes and cholesterol medications. No  regular exercise. Diet fair but not great. No low blood sugars, wounds or vision changes.   Patient Instructions  BEFORE YOU LEAVE: -follow up: regular follow up visit in 3 months -labs for diabetes  IF nasal congestion for several days with ear issues then can do a short 3 day course of Afrin nasal spray. This is available over the counter. Do not use for more then 3 days.  If persistent ear issues please see the Ear, nose and throat doctor for evaluation.    [name] R., DO

## 2015-08-14 NOTE — Telephone Encounter (Signed)
Patient returned call. She saw her PCP this morning and was diagnosed with Tinnitus.  Patient reports one episode of external vaginal itching yesterday. She cleansed her skin in vaginal area, let air dry and used nystatin powder. She reports she no longer is having itching and wants to know if this is okay.  Surgery planned for Friday with GI.  Advised to call back with any continued external or new internal itching. Advised to keep area clean and dry. Change Damp clothes as soon as possible. White cotton underwear is best.  Patient will continue to use Nystatin Powder once daily and will call back with any concerns.  Routing to provider for final review. Patient agreeable to disposition. Will close encounter.

## 2015-08-14 NOTE — Telephone Encounter (Signed)
Patient is here today to see Dr Maudie Mercury.

## 2015-08-14 NOTE — Addendum Note (Signed)
Addended by: Agnes Lawrence on: 08/14/2015 09:18 AM   Modules accepted: Orders

## 2015-08-14 NOTE — Telephone Encounter (Signed)
Message left to return call to Alysah Carton at [phone].    

## 2015-08-14 NOTE — Progress Notes (Signed)
Pre visit review using our clinic review tool, if applicable. No additional management support is needed unless otherwise documented below in the visit note. 

## 2015-08-14 NOTE — Patient Instructions (Addendum)
BEFORE YOU LEAVE: -follow up: regular follow up visit in 3 months -labs for diabetes  IF nasal congestion for several days with ear issues then can do a short 3 day course of Afrin nasal spray. This is available over the counter. Do not use for more then 3 days.  If persistent ear issues please see the Ear, nose and throat doctor for evaluation.

## 2015-08-14 NOTE — Addendum Note (Signed)
Addended by: Gari Crown D on: 08/14/2015 09:27 AM   Modules accepted: Orders

## 2015-08-16 ENCOUNTER — Ambulatory Visit: Payer: Medicare Other | Admitting: Podiatry

## 2015-08-17 DIAGNOSIS — K219 Gastro-esophageal reflux disease without esophagitis: Secondary | ICD-10-CM | POA: Diagnosis not present

## 2015-08-17 DIAGNOSIS — E119 Type 2 diabetes mellitus without complications: Secondary | ICD-10-CM | POA: Diagnosis not present

## 2015-08-17 DIAGNOSIS — Z888 Allergy status to other drugs, medicaments and biological substances status: Secondary | ICD-10-CM | POA: Diagnosis not present

## 2015-08-17 DIAGNOSIS — J302 Other seasonal allergic rhinitis: Secondary | ICD-10-CM | POA: Diagnosis not present

## 2015-08-17 DIAGNOSIS — D509 Iron deficiency anemia, unspecified: Secondary | ICD-10-CM | POA: Diagnosis not present

## 2015-08-17 DIAGNOSIS — Z886 Allergy status to analgesic agent status: Secondary | ICD-10-CM | POA: Diagnosis not present

## 2015-08-17 DIAGNOSIS — I10 Essential (primary) hypertension: Secondary | ICD-10-CM | POA: Diagnosis not present

## 2015-08-17 DIAGNOSIS — D131 Benign neoplasm of stomach: Secondary | ICD-10-CM | POA: Diagnosis not present

## 2015-08-17 DIAGNOSIS — K317 Polyp of stomach and duodenum: Secondary | ICD-10-CM | POA: Diagnosis not present

## 2015-08-17 DIAGNOSIS — Z7984 Long term (current) use of oral hypoglycemic drugs: Secondary | ICD-10-CM | POA: Diagnosis not present

## 2015-08-17 DIAGNOSIS — Z880 Allergy status to penicillin: Secondary | ICD-10-CM | POA: Diagnosis not present

## 2015-08-17 DIAGNOSIS — Z79899 Other long term (current) drug therapy: Secondary | ICD-10-CM | POA: Diagnosis not present

## 2015-08-17 DIAGNOSIS — M1990 Unspecified osteoarthritis, unspecified site: Secondary | ICD-10-CM | POA: Diagnosis not present

## 2015-08-17 DIAGNOSIS — Z881 Allergy status to other antibiotic agents status: Secondary | ICD-10-CM | POA: Diagnosis not present

## 2015-08-17 DIAGNOSIS — Z7982 Long term (current) use of aspirin: Secondary | ICD-10-CM | POA: Diagnosis not present

## 2015-08-17 DIAGNOSIS — E785 Hyperlipidemia, unspecified: Secondary | ICD-10-CM | POA: Diagnosis not present

## 2015-08-20 ENCOUNTER — Telehealth: Payer: Self-pay | Admitting: Certified Nurse Midwife

## 2015-08-20 NOTE — Telephone Encounter (Signed)
Patient would like to speak with nurse about her medication. °

## 2015-08-20 NOTE — Telephone Encounter (Signed)
Call to patient. She is calling to confirm it is okay for her to use Nystatin Powder daily after shower. Advised yes, okay to use power externally daily one time after shower.  She reports some internal itching but that it is only every once in a while.  She declines appointment offered, she wants Linda Thompson CNM to know that she had 6 polyps removed on Friday and she does not feel well but she will call back with any continued vaginal itching.   Routing to provider for final review. Patient agreeable to disposition. Will close encounter.

## 2015-08-21 ENCOUNTER — Ambulatory Visit (INDEPENDENT_AMBULATORY_CARE_PROVIDER_SITE_OTHER): Payer: Medicare Other | Admitting: Certified Nurse Midwife

## 2015-08-21 ENCOUNTER — Telehealth: Payer: Self-pay | Admitting: Certified Nurse Midwife

## 2015-08-21 ENCOUNTER — Encounter: Payer: Self-pay | Admitting: Certified Nurse Midwife

## 2015-08-21 ENCOUNTER — Ambulatory Visit: Payer: Medicare Other | Admitting: Certified Nurse Midwife

## 2015-08-21 VITALS — BP 108/62 | HR 72 | Resp 16 | Ht 62.0 in | Wt 195.0 lb

## 2015-08-21 DIAGNOSIS — N760 Acute vaginitis: Secondary | ICD-10-CM

## 2015-08-21 DIAGNOSIS — Z1239 Encounter for other screening for malignant neoplasm of breast: Secondary | ICD-10-CM

## 2015-08-21 NOTE — Telephone Encounter (Signed)
Spoke with patient. Patient is asking if she should continue using Desitin cream externally if she no longer has a rash. Reports she was applying Desitin cream to her thigh area due to irritation. Advised if she no longer has any skin irritation she should stop using the Desitin cream. Patient verbalizes understanding. Reports she was advised by [name] at her appointment today that she has a yeast infection and to use Monistat for 5 days and antifungal powder as needed. Advised this is correct. Patient is able to repeat instructions and verbalize understanding of care. 22 minute telephone call with patient.  Routing to provider for final review. Patient agreeable to disposition. Will close encounter.

## 2015-08-21 NOTE — Patient Instructions (Signed)

## 2015-08-21 NOTE — Progress Notes (Signed)
60 y.o. Single Caucasian female [name] here with complaint of vaginal symptoms of itching,  and increase discharge in opening of vagina. Describes discharge as slightly wihte.Onset of symptoms 3-4 days ago. Denies new personal products or vaginal dryness. Patient using antifungal powder in groin area daily with no problems and yeast control. Using Hydrocortisone cream prn itching. Has Monistat cream with her, but has not used. "want to be sure to use the right thing". Wears depends only when out, not at home and no incontinence. Had EGD and colonoscopy with 5 polyps, no cancer per patient.  Urinary symptoms none . Contraception is Hysterectomy. Patient has not had breast exam other than mammogram and aex with PCP "who does not do it ". Requests today.  O:Healthy female WDWN Affect: normal, orientation x 3  Exam: Abdomen:non tender, soft Breast bilateral: non tender, no masses, nipple discharge or skin changes noted  Inguinal Lymph nodes: no enlargement or tenderness Pelvic exam: External genital: normal female with slight increase redness on left, right labia known enlargement from Sarcoidosis, no change and no redness. Some scaling on left, with exudate. Wet prep taken BUS: negative Vagina: scant white discharge noted. Ph:4.0   ,Wet prep taken, Affirm taken Cervix: absent Uterus: absent Adnexa: no masses or fullness noted Rectal area: no redness or skin change   Wet Prep results: KOH, Saline positive for yeast external and internal   A:Normal pelvic exam Normal breast exam Yeast vulvovaginitis, chronic history Sarcoidosis under PCP managment    P:Discussed findings of yeast again and etiology. Discussed Aveeno or baking soda wash for comfort. Avoid moist clothes or pads for extended period of time. Continue with OTC antifungal powder in groin area working well. Discussed using her Monistat cream for vaginal and vulva treatment. She had it with her so discussed using once daily in  vagina and on vulva as before, which worked well. Patient voiced understanding and repeated instructions. Patient has another tube of Monistat at home per patient. Questions addressed. Discussed normal breast exam and to do SBE which she does.  Rv prn

## 2015-08-21 NOTE — Telephone Encounter (Signed)
Patient wanting to ask nurse a question about her medication.

## 2015-08-22 LAB — WET PREP BY MOLECULAR PROBE
Candida species: NEGATIVE
GARDNERELLA VAGINALIS: NEGATIVE
TRICHOMONAS VAG: NEGATIVE

## 2015-08-22 NOTE — Progress Notes (Signed)
Reviewed personally.  M. Suzanne Ceniya Fowers, MD.  

## 2015-08-27 ENCOUNTER — Other Ambulatory Visit: Payer: Self-pay | Admitting: Family Medicine

## 2015-08-27 ENCOUNTER — Ambulatory Visit (INDEPENDENT_AMBULATORY_CARE_PROVIDER_SITE_OTHER): Payer: Medicare Other | Admitting: Family Medicine

## 2015-08-27 ENCOUNTER — Telehealth: Payer: Self-pay | Admitting: Emergency Medicine

## 2015-08-27 ENCOUNTER — Telehealth: Payer: Self-pay | Admitting: Certified Nurse Midwife

## 2015-08-27 ENCOUNTER — Encounter: Payer: Self-pay | Admitting: Family Medicine

## 2015-08-27 VITALS — BP 136/88 | HR 97 | Temp 97.5°F | Ht 62.0 in | Wt 191.8 lb

## 2015-08-27 DIAGNOSIS — M545 Low back pain, unspecified: Secondary | ICD-10-CM

## 2015-08-27 NOTE — Progress Notes (Signed)
Pre visit review using our clinic review tool, if applicable. No additional management support is needed unless otherwise documented below in the visit note. 

## 2015-08-27 NOTE — Progress Notes (Signed)
HPI:  Acute visit for:  R low back pain: -started 2 days ago -she didn't know who to call so called 911, they evaluated here and told her to take tylenol and showed her where urgent cares are located so that next time she has a minor musculoskeletal issue she can go there if after hours -she has been doing heat and tylenol and pain is much better today -denies known trauma or injury, fevers, malaise, radiation, weakness or numbness, bowel or bladder incontinence  ROS: See pertinent positives and negatives per HPI.  Past Medical History:  Diagnosis Date  . Allergic rhinitis   . Allergy   . Anemia   . Chronic low back pain   . DDD (degenerative disc disease), lumbar   . GERD (gastroesophageal reflux disease)    w/ hx diaphragmatic hernia  . Glaucoma   . GLAUCOMA NOS 03/11/2006   Qualifier: Diagnosis of  By: Diona Browner MD, Amy    . Heart murmur   . Hx of fracture    R arm, foot  . Hyperlipemia   . Hypertension   . IBS (irritable bowel syndrome)   . Low back pain 11/22/2012  . Meningioma ([place])    ant falx, stable on prior imaging  . Moderate intellectual disabilities   . Murmur   . Obesity   . Osteoarthrosis, unspecified whether generalized or localized, ankle and foot   . Sarcoidosis ([place])    difuse bony lesions and LDA, dx by biopsy in 2016  . Transaminitis   . Type II or unspecified type diabetes mellitus without mention of complication, not stated as uncontrolled     Past Surgical History:  Procedure Laterality Date  . CATARACT EXTRACTION    . COLONOSCOPY  04/15/2011  . ELBOW SURGERY     right elbow  . LIPOMA EXCISION Left [id number]   posterior left axilla  . TOTAL ABDOMINAL HYSTERECTOMY  03/05/1992   leiomyoma and cellular leiomyoma    Family History  Problem Relation Age of Onset  . Diabetes Mother   . Stroke Mother   . Hypertension Mother   . Stroke Father   . Diabetes Maternal Aunt   . Heart disease Maternal Aunt      (had pacemaker)  . Diabetes Maternal  Grandmother   . Stroke Maternal Grandmother   . Hypertension Maternal Grandmother   . Colon cancer Neg Hx     Social History   Social History  . Marital status: Single    Spouse name: N/A  . Number of children: 0  . Years of education: N/A   Occupational History  . Disabled (from arm fracture)    Social History Main Topics  . Smoking status: Never Smoker  . Smokeless tobacco: Never Used  . Alcohol use No  . Drug use: No  . Sexual activity: Not Currently    Birth control/ protection: Surgical     Comment: Hyst--TAH--unsure if has ovaries   Other Topics Concern  . None   Social History Narrative   Lives at [facility] (on [address] in [place])      [place] for arm fracture      Mental Retardation, but is able to drive to places she is comfortable with and prepare her own meals and manage most of her own affairs.      [name] (neighbor) is POA/HCPOA      Reports she gets regular exercise and tries to eat healthy     Current Outpatient Prescriptions:  .  acetaminophen (TYLENOL) 500 MG tablet, Take 500 mg by mouth as needed (left ear pain). Reported on 04/18/2015, Disp: , Rfl:  .  aspirin EC 81 MG tablet, Take 81 mg by mouth daily. Reported on 04/24/2015, Disp: , Rfl:  .  diltiazem (CARDIZEM CD) 240 MG 24 hr capsule, TAKE ONE CAPSULE BY MOUTH EVERY DAY, Disp: 90 capsule, Rfl: 1 .  ferrous sulfate 325 (65 FE) MG tablet, TAKE 1 TABLET (325 MG TOTAL) BY MOUTH 2 (TWO) TIMES DAILY WITH A MEAL., Disp: 60 tablet, Rfl: 2 .  hydrochlorothiazide (MICROZIDE) 12.5 MG capsule, TAKE 1 CAPSULE BY MOUTH ONCE DAILY, Disp: 90 capsule, Rfl: 1 .  hydrocortisone 2.5 % ointment, APPLY TO AFFECTED AREA TWICE A DAY, Disp: , Rfl: 0 .  ipratropium (ATROVENT) 0.06 % nasal spray, PLACE 2 SPRAYS INTO BOTH NOSTRILS 4 (FOUR) TIMES DAILY., Disp: 45 mL, Rfl: 3 .  ketoconazole (NIZORAL) 2 % shampoo, APPLY TO AFFECTED AREA TWICE A WEEK AS DIRECTED, Disp: 120 mL,  Rfl: 1 .  KLOR-CON M20 20 MEQ tablet, TAKE 1 TABLET BY MOUTH EVERY DAY, Disp: 90 tablet, Rfl: 3 .  Lactobacillus (FLORAJEN ACIDOPHILUS PO), Take 1 tablet by mouth daily., Disp: , Rfl:  .  latanoprost (XALATAN) 0.005 % ophthalmic solution, Place 1 drop into both eyes at bedtime. , Disp: , Rfl:  .  liver oil-zinc oxide (DESITIN) 40 % ointment, Apply 1 application topically as needed for irritation., Disp: , Rfl:  .  loperamide (IMODIUM) 2 MG capsule, Take 2 mg by mouth as needed for diarrhea or loose stools (PRN diarrhea)., Disp: , Rfl:  .  loratadine (CLARITIN) 10 MG tablet, Take 10 mg by mouth daily., Disp: , Rfl:  .  metFORMIN (GLUCOPHAGE) 500 MG tablet, TAKE 1 TABLET BY MOUTH TWICE A DAY WITH A MEAL, Disp: 180 tablet, Rfl: 1 .  omeprazole (PRILOSEC) 20 MG capsule, TAKE 1 CAPSULE (20 MG TOTAL) BY MOUTH 2 (TWO) TIMES DAILY., Disp: 180 capsule, Rfl: 3 .  pravastatin (PRAVACHOL) 40 MG tablet, TAKE 1 TABLET BY MOUTH EVERY EVENING, Disp: 90 tablet, Rfl: 1 .  Propylene Glycol (SYSTANE BALANCE) 0.6 % SOLN, , Disp: , Rfl:   EXAM:  Vitals:   08/27/15 0837  BP: 136/88  Pulse: 97  Temp: 97.5 F (36.4 C)    Body mass index is 35.08 kg/m.  GENERAL: vitals reviewed and listed above, alert, oriented, appears well hydrated and in no acute distress  HEENT: atraumatic, conjunttiva clear, no obvious abnormalities on inspection of external nose and ears  NECK: no obvious masses on inspection  LUNGS: clear to auscultation bilaterally, no wheezes, rales or rhonchi, good air movement  CV: HRRR, no peripheral edema  MS: moves all extremities without noticeable abnormality Normal Gait Normal inspection of back, no obvious scoliosis or leg length descrepancy No bony TTP  TTP at: R SI jt -/+ tests: neg trendelenburg,-facet loading, -SLRT, -CLRT, -FABER, -FADIR Normal muscle strength, sensation to light touch and DTRs in LEs bilaterally  PSYCH: pleasant and cooperative, no obvious depression or  anxiety  ASSESSMENT AND PLAN:  Discussed the following assessment and plan:  Right-sided low back pain without sciatica  -we discussed possible serious and likely etiologies, workup and treatment, treatment risks and return precautions - query strain, sacroiliitis -after this discussion, Jasdeep opted for heat, HEP, tylenol -follow up advised 4 weeks -of course, we advised Yalini  to return or notify a doctor immediately if symptoms worsen or persist or new concerns arise.  Marland Kitchen  -  Patient advised to return or notify a doctor immediately if symptoms worsen or persist or new concerns arise.  Patient Instructions  BEFORE YOU LEAVE: -SI joint exercises  Do the exercises provided 4 days per week.  Take the tylenol 1 or 2 tablets up to twice daily IF NEEDED for pain.  Heat for 15 minutes twice daily.  Menthol sports cream such as tiger balm per instructions as needed for pain.  Follow up if pain persists in 1 month.   [name] R., DO

## 2015-08-27 NOTE — Telephone Encounter (Signed)
Spoke with patient. She has used Monistat and completed treatment. Continues to complain of vaginal itching internally.

## 2015-08-27 NOTE — Telephone Encounter (Signed)
Please Advise    PLEASE NOTE: All timestamps contained within this report are represented as [place] Standard Time. CONFIDENTIALTY NOTICE: This fax transmission is intended only for the addressee. It contains information that is legally privileged, confidential or otherwise protected from use or disclosure. If you are not the intended recipient, you are strictly prohibited from reviewing, disclosing, copying using or disseminating any of this information or taking any action in reliance on or regarding this information. If you have received this fax in error, please notify us immediately by telephone so that we can arrange for its return to [place]. Phone: [phone], Toll-Free: [phone], Fax: [phone] Page: 1 of 2 Call Id: [id number] [facility] Night - Client [place] Patient Name: Linda Thompson Gender: Female DOB: [birth year] Age: 60 Y 25 D Return Phone Number: [phone] (Primary) Address: [address] City/State/Zip: [name] [address] Client [facility] Night - Client Client Site [facility] - Night Physician [name]- MD Contact Type Call Who Is Calling Patient / Member / Family / Caregiver Call Type Triage / Clinical Relationship To Patient Self Return Phone Number [phone] (Primary) Chief Complaint Hip pain Reason for Call Symptomatic / Request for [place] states right leg at hip is sore when sitting down. She may have arthritis. PreDisposition Did not know what to do Translation No Nurse Assessment Nurse: Margaretmary Dys, RN, Hettie Date/Time (Eastern Time): 08/25/2015 10:42:30 AM Confirm and document reason for call. If symptomatic, describe symptoms. You must click the next button to save text entered. ---Caller states right leg at hip is sore when sitting down. She may have arthritis. Pain in hip and down leg. Pain  started this AM. No injury. Has the patient traveled out of the country within the last 30 days? ---Not Applicable Does the patient have any new or worsening symptoms? ---Yes Will a triage be completed? ---Yes Related visit to physician within the last 2 weeks? ---No Does the PT have any chronic conditions? (i.e. diabetes, asthma, etc.) ---Yes List chronic conditions. ---Diabetes, on Metformin, high BP Is this a behavioral health or substance abuse call? ---No Guidelines Guideline Title Affirmed Question Affirmed Notes Nurse Date/Time Eilene Ghazi Time) Hip Pain Hip pain (all triage questions negative) Brewner, RN, Hettie 08/25/2015 10:47:41 AM Bee or Yellow Jacket Sting Normal local reaction to bee, wasp, or yellow jacket sting (all triage questions negative) Brewner, RN, Hettie 08/26/2015 9:19:50 PM Disp. Time Eilene Ghazi Time) Disposition Final User 08/25/2015 10:52:38 AM Home Care Yes Brewner, RN, Hettie PLEASE NOTE: All timestamps contained within this report are represented as [place] Standard Time. CONFIDENTIALTY NOTICE: This fax transmission is intended only for the addressee. It contains information that is legally privileged, confidential or otherwise protected from use or disclosure. If you are not the intended recipient, you are strictly prohibited from reviewing, disclosing, copying using or disseminating any of this information or taking any action in reliance on or regarding this information. If you have received this fax in error, please notify us immediately by telephone so that we can arrange for its return to [place]. Phone: [phone], Toll-Free: [phone], Fax: [phone] Page: 2 of 2 Call Id: [id number] Caller Understands: Yes Disagree/Comply: Comply Care Advice Given Per Guideline HOME CARE: You should be able to treat this at home. REST YOUR HIP for the next couple days: * Avoid activities that worsen your pain. LOCAL HEAT: Apply a warm wet washcloth or heating pad  for 10 minutes three  times daily to help increase circulation and improve healing. PAIN MEDICINES: * For pain relief, take acetaminophen, ibuprofen, or naproxen. CALL BACK IF: * Fever occurs * Redness occurs * Pain persists over 7 days * You become worse. CARE ADVICE given per Hip Pain (Adult) guideline.

## 2015-08-27 NOTE — Telephone Encounter (Signed)
Pt has appointment today @ 9am with Dr. Maudie Mercury

## 2015-08-27 NOTE — Patient Instructions (Signed)
BEFORE YOU LEAVE: -SI joint exercises  Do the exercises provided 4 days per week.  Take the tylenol 1 or 2 tablets up to twice daily IF NEEDED for pain.  Heat for 15 minutes twice daily.  Menthol sports cream such as tiger balm per instructions as needed for pain.  Follow up if pain persists in 1 month.

## 2015-08-27 NOTE — Telephone Encounter (Signed)
Spoke with patient. Patient states that she was seen with EMS over the weekend for leg pain. States she was advised this was caused by arthritis. Reports she was advised to take Tylenol and use a heating pad for relief. She is scheduled for follow up with her PCP and pediatrist. Reports she is still having internal vaginal itching after using Monistat. Patient was scheduled for evaluation with [name] tomorrow 08/28/2015 at 10 am by Karen Chafe, RN. Advised she will need to keep this appointment as scheduled. She is agreeable to date and time.  Routing to provider for final review. Patient agreeable to disposition. Will close encounter.

## 2015-08-27 NOTE — Telephone Encounter (Signed)
Patient is asking to talk with Wonda Amis nurse. Patient was treated by EMS over the weekend was not transported to the hospital. Patient was told to follow up with PCP or an Urgent Care for the leg pain. Patient was seen today by her PCP for this pain and would like to talk with Regina Eck nurse.

## 2015-08-28 ENCOUNTER — Encounter: Payer: Self-pay | Admitting: Certified Nurse Midwife

## 2015-08-28 ENCOUNTER — Ambulatory Visit (INDEPENDENT_AMBULATORY_CARE_PROVIDER_SITE_OTHER): Payer: Medicare Other | Admitting: Certified Nurse Midwife

## 2015-08-28 VITALS — BP 110/80 | HR 76 | Resp 16 | Ht 62.0 in | Wt 191.0 lb

## 2015-08-28 DIAGNOSIS — Z8742 Personal history of other diseases of the female genital tract: Secondary | ICD-10-CM | POA: Diagnosis not present

## 2015-08-28 NOTE — Progress Notes (Signed)
60 y.o. Single Caucasian female [name] here with complaint of vaginal symptoms occasional vaginal itch and no increase discharge. Describes discharge as none.. Was seen for yeast vaginitis on 08/21/15. Patient used OTC medication of Monistat with good results. Denies new personal products or vaginal dryness now. Not sexually active. Urinary symptoms no changes, continues with Depends use when not at home . Recent seen by EMS due to leg pain. Patient was just frightened. Saw Dr.Hannah Maudie Mercury and was diagnosed with low back pain with arthritis. Patient told to use OTC medication and analgesic cream prn for pain. Patient feels this is helping.. Does not feel she has a yeast infection, just wants to be checked to "be sure". Has mammogram appointment scheduled.. No other health issues today.   O:Healthy female WDWN Affect: normal, orientation x 3  Wet Prep: KOH and Saline negative for yeast  Exam: Abdomen:non tender, soft, no masses  Inguinal Lymph node: no enlargement or tenderness Pelvic exam: External genital: normal female,  No redness or increase pink, scaling or exudate. Slight residue of powder in groin creases only. Right labia still enlarged(known) no change BUS: negative Vagina: normal scant discharge noted. Ph:4.0   ,Wet prep taken,  Cervix: absent Uterus: absent Adnexa: no masses or fullness or tenderness noted     A:Normal pelvic exam Yeast vaginitis resolved Recent diagnosis of low back pain with PCP management Mammogram scheduled  P:Discussed findings of no vaginal or vulva yeast  noted . Continue to wear her light weight depends only as needed. Continue to use her OTC medication only as needed. Discussed she feels itching in vaginal area occurs when she is stressed at times. Discussed with patient this can occur.  Avoid moist clothes or pads for extended period of time. Reassured she is managing her care appropriately. Continue follow up with PCP as indicated with back issues.  Encouraged to do exercises she was given if possible.  Rv prn

## 2015-08-29 ENCOUNTER — Ambulatory Visit (INDEPENDENT_AMBULATORY_CARE_PROVIDER_SITE_OTHER): Payer: Medicare Other | Admitting: Podiatry

## 2015-08-29 ENCOUNTER — Ambulatory Visit: Payer: Medicare Other | Admitting: Podiatry

## 2015-08-29 DIAGNOSIS — M79673 Pain in unspecified foot: Secondary | ICD-10-CM | POA: Diagnosis not present

## 2015-08-29 DIAGNOSIS — B351 Tinea unguium: Secondary | ICD-10-CM | POA: Diagnosis not present

## 2015-08-29 DIAGNOSIS — M5430 Sciatica, unspecified side: Secondary | ICD-10-CM | POA: Diagnosis not present

## 2015-08-29 NOTE — Progress Notes (Signed)
Patient ID: Linda Thompson, female   DOB: 01/01/1956, 60 y.o.   MRN: [id number] Complaint:  Visit Type: Patient returns to my office for continued preventative foot care services. Complaint: Patient states" my nails have grown long and thick and become painful to walk and wear shoes" Patient has been diagnosed with DM with no foot complications. The patient presents for preventative foot care services. No changes to ROS  Podiatric Exam: Vascular: dorsalis pedis and posterior tibial pulses are palpable bilateral. Capillary return is immediate. Temperature gradient is WNL. Skin turgor WNL  Sensorium: Normal Semmes Weinstein monofilament test. Normal tactile sensation bilaterally. Nail Exam: Pt has thick disfigured discolored nails with subungual debris noted bilateral entire nail hallux through fifth toenails Ulcer Exam: There is no evidence of ulcer or pre-ulcerative changes or infection. Orthopedic Exam: Muscle tone and strength are WNL. No limitations in general ROM. No crepitus or effusions noted. Foot type and digits show no abnormalities. Bony prominences are unremarkable. Skin: No Porokeratosis. No infection or ulcers  Diagnosis:  Onychomycosis, , Pain in right toe, pain in left toes  Treatment & Plan Procedures and Treatment: Consent by patient was obtained for treatment procedures. The patient understood the discussion of treatment and procedures well. All questions were answered thoroughly reviewed. Debridement of mycotic and hypertrophic toenails, 1 through 5 bilateral and clearing of subungual debris. No ulceration, no infection noted.  Return Visit-Office Procedure: Patient instructed to return to the office for a follow up visit 3 months for continued evaluation and treatment.   Gianpaolo Mindel DPM 

## 2015-08-30 DIAGNOSIS — K317 Polyp of stomach and duodenum: Secondary | ICD-10-CM | POA: Insufficient documentation

## 2015-08-31 DIAGNOSIS — Z23 Encounter for immunization: Secondary | ICD-10-CM | POA: Diagnosis not present

## 2015-09-03 ENCOUNTER — Telehealth: Payer: Self-pay | Admitting: Certified Nurse Midwife

## 2015-09-03 NOTE — Progress Notes (Signed)
Encounter reviewed Garnell Phenix, MD   

## 2015-09-03 NOTE — Telephone Encounter (Signed)
Spoke with patient. Patient states that she woke up this morning and is having external vaginal itching. Asking if she may use OTC hydrocortisone cream externally for itching. Advised may use externally only for itching. Advised if itching persists she will need to be seen in the office for further evaluation. She is agreeable. Reports she would like [name] to know she received a call from her MD at [facility] who performed her colonoscopy and she may have to have another surgery for her stomach. She is unaware of the kind of surgery and is waiting for her MD to speak with her neighbor. She will provide [name] with an update as soon as she has further information.  Routing to provider for final review. Patient agreeable to disposition. Will close encounter.

## 2015-09-03 NOTE — Telephone Encounter (Signed)
Patient is "still itching after taking medication". Patient declined an appointment, wants to talk with Regina Eck, CNM nurse.

## 2015-09-04 ENCOUNTER — Ambulatory Visit (INDEPENDENT_AMBULATORY_CARE_PROVIDER_SITE_OTHER): Payer: Medicare Other | Admitting: Certified Nurse Midwife

## 2015-09-04 ENCOUNTER — Encounter: Payer: Self-pay | Admitting: Certified Nurse Midwife

## 2015-09-04 VITALS — BP 124/84 | HR 70 | Resp 16 | Ht 62.0 in | Wt 191.0 lb

## 2015-09-04 DIAGNOSIS — B373 Candidiasis of vulva and vagina: Secondary | ICD-10-CM | POA: Diagnosis not present

## 2015-09-04 DIAGNOSIS — N952 Postmenopausal atrophic vaginitis: Secondary | ICD-10-CM

## 2015-09-04 DIAGNOSIS — B3731 Acute candidiasis of vulva and vagina: Secondary | ICD-10-CM

## 2015-09-04 NOTE — Patient Instructions (Signed)
Monilial Vaginitis Vaginitis in a soreness, swelling and redness (inflammation) of the vagina and vulva. Monilial vaginitis is not a sexually transmitted infection. CAUSES  Yeast vaginitis is caused by yeast (candida) that is normally found in your vagina. With a yeast infection, the candida has overgrown in number to a point that upsets the chemical balance. SYMPTOMS   White, thick vaginal discharge.  Swelling, itching, redness and irritation of the vagina and possibly the lips of the vagina (vulva).  Burning or painful urination.  Painful intercourse. DIAGNOSIS  Things that may contribute to monilial vaginitis are:  Postmenopausal and virginal states.  Pregnancy.  Infections.  Being tired, sick or stressed, especially if you had monilial vaginitis in the past.  Diabetes. Good control will help lower the chance.  Birth control pills.  Tight fitting garments.  Using bubble bath, feminine sprays, douches or deodorant tampons.  Taking certain medications that kill germs (antibiotics).  Sporadic recurrence can occur if you become ill. TREATMENT  Your caregiver will give you medication.  There are several kinds of anti monilial vaginal creams and suppositories specific for monilial vaginitis. For recurrent yeast infections, use a suppository or cream in the vagina 2 times a week, or as directed.  Anti-monilial or steroid cream for the itching or irritation of the vulva may also be used. Get your caregiver's permission.  Painting the vagina with methylene blue solution may help if the monilial cream does not work.  Eating yogurt may help prevent monilial vaginitis. HOME CARE INSTRUCTIONS   Finish all medication as prescribed.  Do not have sex until treatment is completed or after your caregiver tells you it is okay.  Take warm sitz baths.  Do not douche.  Do not use tampons, especially scented ones.  Wear cotton underwear.  Avoid tight pants and panty  hose.  Tell your sexual partner that you have a yeast infection. They should go to their caregiver if they have symptoms such as mild rash or itching.  Your sexual partner should be treated as well if your infection is difficult to eliminate.  Practice safer sex. Use condoms.  Some vaginal medications cause latex condoms to fail. Vaginal medications that harm condoms are:  Cleocin cream.  Butoconazole (Femstat).  Terconazole (Terazol) vaginal suppository.  Miconazole (Monistat) (may be purchased over the counter). SEEK MEDICAL CARE IF:   You have a temperature by mouth above 102 F (38.9 C).  The infection is getting worse after 2 days of treatment.  The infection is not getting better after 3 days of treatment.  You develop blisters in or around your vagina.  You develop vaginal bleeding, and it is not your menstrual period.  You have pain when you urinate.  You develop intestinal problems.  You have pain with sexual intercourse.   This information is not intended to replace advice given to you by your health care provider. Make sure you discuss any questions you have with your health care provider.   Document Released: 10/16/2004 Document Revised: 03/31/2011 Document Reviewed: 07/10/2014 Elsevier Interactive Patient Education 2016 [employer] vaginal cream once at night for up to 7 days. Hydrocortisone cream as needed for itching, no more than 5 -7 day use. If bleeding needs to come in for appointment.  Debbi

## 2015-09-04 NOTE — Progress Notes (Signed)
60 y.o. Single Caucasian female [name] here with complaint of vaginal symptoms of itching,only  Describes discharge as none. Patient describes an "itchy spot" when she is worried at times and feels this is when she has yeast again. Is very careful with her hygiene with urination and bowel movements. She feels this itching is no different than before. Has used Monistat 7 on area with some relief, but afraid to use more than a couple of days. Onset of symptoms 3-4 days ago. Denies new personal products. Uses coconut oil occasional for vaginal dryness. No STD concerns. Urinary symptoms none . Menopausal, denies any vaginal bleeding. Patient was also called by GI who did her colonoscopy to discuss repeat colonoscopy due to High grade lesion. Patient worrying about this, but can not schedule until 3 months due to healing. Patient has support person that took her and will have her call the MD office to schedule. Denies any incontinence or other problems today. Back is better.   O:Healthy female WDWN Affect: normal, orientation x 3  Exam:Skin: warm and dry Abdomen:soft, non tender Lymph node: no enlargement or tenderness Pelvic exam: External genital: normal female atrophic, right vulva larger than left due to mass from sarcoidosis, no redness, scaling, excoriation or exudate. No excess powder or cream noted BUS: negative Vagina: very scant discharge noted. Ph:5.0   ,Wet prep taken, Cervix: absent Uterus: absent Adnexa:normal, non tender, no masses or fullness noted   Wet Prep results: KOH,Saline positive for yeast only( 1-2 hyphae only)   A:Normal pelvic exam Yeast vaginitis Atrophic vaginitis High grade lesion removed with colonoscopy under follow up Right vulva mass unchanged in size and feel   P:Discussed findings of yeast vaginitis and etiology. Patient has Monistat 7 and would prefer use, due to cost and has 2 tubes. Discussed not over cleaning which dries out the vaginal area, avoid  putting toliet paper in vagina. Avoid moist clothes or pads for extended period of . Coconut Oil use for skin protection and dryness. OTC Monistat 7 vaginal cream every hs x 7 instructions repeated and written for patient and she repeated back to me. Talk to her support person when she is available about GI follow up.  Rv prn

## 2015-09-07 DIAGNOSIS — H9311 Tinnitus, right ear: Secondary | ICD-10-CM | POA: Diagnosis not present

## 2015-09-08 ENCOUNTER — Other Ambulatory Visit: Payer: Self-pay | Admitting: Gastroenterology

## 2015-09-11 ENCOUNTER — Telehealth: Payer: Self-pay | Admitting: Certified Nurse Midwife

## 2015-09-11 ENCOUNTER — Ambulatory Visit (INDEPENDENT_AMBULATORY_CARE_PROVIDER_SITE_OTHER): Payer: Medicare Other | Admitting: Certified Nurse Midwife

## 2015-09-11 VITALS — BP 120/80 | HR 74 | Resp 16 | Ht 62.0 in | Wt 192.0 lb

## 2015-09-11 DIAGNOSIS — B373 Candidiasis of vulva and vagina: Secondary | ICD-10-CM | POA: Diagnosis not present

## 2015-09-11 DIAGNOSIS — B3731 Acute candidiasis of vulva and vagina: Secondary | ICD-10-CM

## 2015-09-11 MED ORDER — FLUCONAZOLE 150 MG PO TABS: 150.0000 mg | ORAL_TABLET | Freq: Once | ORAL | 0 refills | Status: AC

## 2015-09-11 NOTE — Telephone Encounter (Signed)
agreeable

## 2015-09-11 NOTE — Telephone Encounter (Signed)
Patient has a question about the prescription she was given last week.

## 2015-09-11 NOTE — Progress Notes (Signed)
60 y.o. Single Caucasian female [name] here with complaint of vaginal symptoms of itching on one place outside only. Onset of symptoms 1 days ago. Was treated for yeast vaginitis with Monistat 7 on 09/04/15. Completed all medication on 09/10/15. Now has an area on inside area that is irritated. Denies new personal products or vaginal dryness. No STD concerns. Urinary symptoms none . Contraception is hysterectomy.   O:Healthy female WDWN Affect: normal, orientation x 3  Exam: Abdomen: soft. Non tender Lymph node: no enlargement or tenderness Pelvic exam: External genital: normal female, inside right/left vulva thinning note with slight pigmentation change, appears from over use of medication. Showed area to patient. Wet prep taken. Right vulva still enlarged due to sarcoidosis mass previously noted, no change in size BUS: negative Vagina: scant discharge noted. Ph:4.0   ,Wet prep taken,  Cervix: absent Uterus:absent Adnexa:absent  Wet Prep results: few yeast buds noted only with KOH,Saline wet prep on vulva area only.   A:Normal pelvic exam Yeast vulvitis Yeast vaginitis   P:Discussed findings of yeast vulvitis and etiology. Discussed stopping multiple vaginal creams due to thinning noted of skin. Will treat with Diflucan for yeast. Explained to patient at length to use cold wash cloth on area if needed.  Coconut Oil use for skin protection only if needed at this point.  Patient repeated instructions x 3 regarding avoiding other creams without being seen. Will plan to recheck area in 2 1/2 weeks. Patient agreeable to plan. Rx Diflucan see order with instructions  Rv prn or as above

## 2015-09-11 NOTE — Telephone Encounter (Signed)
Spoke with patient. Patient was seen in the office on 09/04/2015 with [name]. Was advised to use Monistat 7 for 7 nights. Patient states she completed 7 day course yesterday. Woke up this morning and 1 episode of external itching. Asking if she may use hydrocortisone cream for itching. Advised if itching occurs again may use hydrocortisone cream externally only. If itching persists she will need to be seen in the office for further evaluation. She is agreeable. Asking if her symptoms worsen if she can be seen at urgent care over the weekend. Advised she may be seen at urgent care if symptoms worsen and our office is closed. She is agreeable and verbalizes understanding.  Routing to provider for final review. Patient agreeable to disposition. Will close encounter.

## 2015-09-12 NOTE — Telephone Encounter (Signed)
Spoke with patient. Patient is asking if she needs to use a wash cloth as recommended by [name] what the temperature of the water should be and how to use it. Advised per OV from 09/11/2015 she may apply a cold rag externally to her vaginal tissue if she has any itching or discomfort. Advised may hold the cloth on the area or dab the area, but should not scrub. May do this for 5-10 minutes as needed. Needs to ensure she dries vaginal tissue after use. She is agreeable and verbalizes understanding.  Routing to provider for final review. Patient agreeable to disposition. Will close encounter.

## 2015-09-12 NOTE — Telephone Encounter (Signed)
Patient called with questions for the nurse. She said, "I have some questions about how to use the washrag like Debbi told me to. I'd like to speak with Debbi's nurse."

## 2015-09-13 NOTE — Progress Notes (Signed)
Encounter reviewed [name], MD   

## 2015-09-14 ENCOUNTER — Encounter: Payer: Self-pay | Admitting: Certified Nurse Midwife

## 2015-09-14 ENCOUNTER — Ambulatory Visit (INDEPENDENT_AMBULATORY_CARE_PROVIDER_SITE_OTHER): Payer: Medicare Other | Admitting: Certified Nurse Midwife

## 2015-09-14 VITALS — BP 120/80 | HR 72 | Resp 16 | Ht 62.0 in | Wt 192.0 lb

## 2015-09-14 DIAGNOSIS — N952 Postmenopausal atrophic vaginitis: Secondary | ICD-10-CM | POA: Diagnosis not present

## 2015-09-14 DIAGNOSIS — N898 Other specified noninflammatory disorders of vagina: Secondary | ICD-10-CM

## 2015-09-14 DIAGNOSIS — L298 Other pruritus: Secondary | ICD-10-CM | POA: Diagnosis not present

## 2015-09-14 DIAGNOSIS — N760 Acute vaginitis: Secondary | ICD-10-CM | POA: Diagnosis not present

## 2015-09-14 NOTE — Progress Notes (Signed)
60 y.o. Single Caucasian female [name] here with complaint of vaginal symptoms of itching after using diflucan. Took Diflucan 09/12/15 as directed. Patient has been putting cool cloth on area of itching and feels better. Stopped hydrocortisone cream as directed.  Denies new personal products. Wanted to tell me" she was having her colonoscopy in 10/17 and neighbor was going to take her". Patient worried itching will get worse. Did not purchase coconut oil for dryness yet. Need to have written down again. No other concerns. Using Depends only when out and loose underwear other times. No STD concerns. Urinary symptoms None . Contraception is hysterectomy. No other concerns today.   O:Healthy female WDWN Affect: normal, orientation x 3  Exam:skin warm and dry Abdomen:soft, non tender,  Inguinal Lymph nodes: no enlargement or tenderness Pelvic exam: External genital: normal female, with enlarged right vulva known, no change in size. No redness noted externally, no scaling, no exudate. Points to same area of right inside introitus of itching. BUS: negative Vagina: scant to no discharge noted.  Affirm taken At introital opening area of thinning less redness today, and no other areas noted., tissue atrophic Cervix: absent Uterus: absent Adnexa:absent  A:Normal pelvic exam Atrophic vaginitis History of chronic yeast with Diflucan use Poly medication overuse, OTC use due to expense Limited understanding   P:Discussed findings of atrophic vaginitis(dryness) and etiology. Discussed area of thinning looks better with stopping hydrocortisone. Do not restart at this time. Discussed Diflucan is still working that she took 2 days ago, and no other treatment other than for dryness recommended. Will treat per affirm if indicated and patient will be called. Discussed and wrote down coconut oil use for dryness with instructions. Discussed this will help with the itching. Questions addressed.   Rv prn

## 2015-09-14 NOTE — Patient Instructions (Signed)
Atrophic Vaginitis Atrophic vaginitis is when the tissues that line the vagina become dry and thin. This is caused by a drop in estrogen. Estrogen helps:  To keep the vagina moist.  To make a clear fluid that helps:  To lubricate the vagina for sex.  To protect the vagina from infection. If the lining of the vagina is dry and thin, it may:  Make sex painful. It may also cause bleeding.  Cause a feeling of:  Burning.  Irritation.  Itchiness.  Make an exam of your vagina painful. It may also cause bleeding.  Make you lose interest in sex.  Cause a burning feeling when you pee.  Make your vaginal fluid (discharge) brown or yellow. For some women, there are no symptoms. This condition is most common in women who do not get their regular menstrual periods anymore (menopause). This often starts when a woman is 37-43 years old. HOME CARE  Take medicines only as told by your doctor. Do not use any herbal or alternative medicines unless your doctor says it is okay.  Use over-the-counter products for dryness only as told by your doctor. These include:  Creams.  Lubricants.  Moisturizers.  Do not douche.  Do not use products that can make your vagina dry. These include:  Scented feminine sprays.  Scented tampons.  Scented soaps.  If it hurts to have sex, tell your sexual partner. GET HELP IF:  Your discharge looks different than normal.  Your vagina has an unusual smell.  You have new symptoms.  Your symptoms do not get better with treatment.  Your symptoms get worse.   This information is not intended to replace advice given to you by your health care provider. Make sure you discuss any questions you have with your health care provider.   Document Released: 06/25/2007 Document Revised: 05/23/2014 Document Reviewed: 12/28/2013 Elsevier Interactive Patient Education [employer].

## 2015-09-15 ENCOUNTER — Encounter: Payer: Self-pay | Admitting: Certified Nurse Midwife

## 2015-09-15 LAB — WET PREP BY MOLECULAR PROBE
Candida species: NEGATIVE
GARDNERELLA VAGINALIS: NEGATIVE
Trichomonas vaginosis: NEGATIVE

## 2015-09-15 NOTE — Progress Notes (Signed)
Encounter reviewed Ionna Avis, MD   

## 2015-09-15 NOTE — Progress Notes (Signed)
Encounter reviewed [name], MD   

## 2015-09-17 ENCOUNTER — Telehealth: Payer: Self-pay | Admitting: Certified Nurse Midwife

## 2015-09-17 ENCOUNTER — Encounter: Payer: Self-pay | Admitting: Nurse Practitioner

## 2015-09-17 ENCOUNTER — Ambulatory Visit (INDEPENDENT_AMBULATORY_CARE_PROVIDER_SITE_OTHER): Payer: Medicare Other | Admitting: Nurse Practitioner

## 2015-09-17 VITALS — BP 124/82 | HR 80 | Resp 16 | Ht 62.0 in | Wt 191.8 lb

## 2015-09-17 DIAGNOSIS — N760 Acute vaginitis: Secondary | ICD-10-CM

## 2015-09-17 NOTE — Telephone Encounter (Signed)
Patient is asking to talk with Linda Thompson about the itching she is having after using coconut oil.

## 2015-09-17 NOTE — Progress Notes (Signed)
60 y.o. Single Caucasian female [name] here with complaint of vaginal symptoms of stinging. Onset of symptoms 2-3 days ago. Denies new personal products or vaginal dryness. No STD concerns. Urinary symptoms none . Contraception is Hysterectomy. Patient states that she has used coconut oil 1 time this weekend and it feels "like it is stinging". Advised patient of results of affirm from Friday's visit with Evalee Mutton, CNM.    O:  Healthy female WDWN Affect: normal, orientation x 3  Exam: no distress Abdomen: soft and nontender Lymph node: no enlargement or tenderness Pelvic exam: External genital: normal female with enlarged right vulva known, with a healing area on the inner aspect of right labia and very small area inside of left area also healing BUS: negative Vagina: no discharge noted.       A: Healing Vaginitis  Recent Affirm test from Friday is negative   P: Discussed findings of vaginitis and etiology. Discussed Aveeno or baking soda sitz bath for comfort. Avoid moist clothes or pads for extended period of time. If working out in gym clothes or swim suits for long periods of time change underwear or bottoms of swimsuit if possible. Olive Oil/Coconut Oil use for skin protection prior to activity can be used to external skin.  Rx: none  Instructions again on Coconut Oil  Rv prn

## 2015-09-17 NOTE — Patient Instructions (Signed)
Use coconut cream three times a day.

## 2015-09-17 NOTE — Telephone Encounter (Signed)
Office visit scheduled for today (09/17/2015) at 1245 with Kem Boroughs, FNP.  Routing to provider for final review. Patient agreeable to disposition. Will close encounter.

## 2015-09-18 ENCOUNTER — Other Ambulatory Visit: Payer: Self-pay | Admitting: Emergency Medicine

## 2015-09-18 MED ORDER — POTASSIUM CHLORIDE CRYS ER 20 MEQ PO TBCR: 20.0000 meq | EXTENDED_RELEASE_TABLET | Freq: Every day | ORAL | 3 refills | Status: DC

## 2015-09-18 NOTE — Telephone Encounter (Signed)
Pt last seen 08/27/15  Last filled 12/01/14 by Dutch Quint  Okay to refill?

## 2015-09-19 ENCOUNTER — Telehealth: Payer: Self-pay | Admitting: Certified Nurse Midwife

## 2015-09-19 DIAGNOSIS — Z1231 Encounter for screening mammogram for malignant neoplasm of breast: Secondary | ICD-10-CM | POA: Diagnosis not present

## 2015-09-19 LAB — HM MAMMOGRAPHY

## 2015-09-19 NOTE — Telephone Encounter (Signed)
Patient is asking to talk with Regina Eck nurse about the coconut cream she is using. Patient was seen Monday and is still using the coconut cream and is still having itching.

## 2015-09-19 NOTE — Progress Notes (Signed)
Reviewed personally.  M. Suzanne Tobechukwu Emmick, MD.  

## 2015-09-19 NOTE — Telephone Encounter (Signed)
Spoke with patient. Patient states that she has continued to use coconut oil as recommended by [name] and Kem Boroughs, FNP. States she has been using it 3 times per day as instructed by Kem Boroughs, FNP. States she is feeling less irritated and better since she was seen on 09/17/2015 with Kem Boroughs, FNP. Asking if she can use coconut oil internally as well. Advised she apply coconut oil to the inner portion of the vaginal opening and may use externally. She is agreeable and verbalizes understanding. Patient has a follow up appointment scheduled on 10/03/2015 at 10 am with [name].  Routing to covering provider for final review. Patient agreeable to disposition. Will close encounter.

## 2015-09-23 DIAGNOSIS — H9311 Tinnitus, right ear: Secondary | ICD-10-CM | POA: Diagnosis not present

## 2015-09-25 ENCOUNTER — Ambulatory Visit (INDEPENDENT_AMBULATORY_CARE_PROVIDER_SITE_OTHER): Payer: Medicare Other | Admitting: Nurse Practitioner

## 2015-09-25 ENCOUNTER — Encounter: Payer: Self-pay | Admitting: Nurse Practitioner

## 2015-09-25 ENCOUNTER — Encounter: Payer: Self-pay | Admitting: Family Medicine

## 2015-09-25 VITALS — BP 128/70 | HR 76 | Resp 18 | Ht 62.0 in | Wt 191.0 lb

## 2015-09-25 DIAGNOSIS — R300 Dysuria: Secondary | ICD-10-CM | POA: Diagnosis not present

## 2015-09-25 DIAGNOSIS — N760 Acute vaginitis: Secondary | ICD-10-CM

## 2015-09-25 NOTE — Progress Notes (Signed)
60 y.o. Single Caucasian female [name] here with complaint of vaginal symptoms of itching or just being uncomfortable in the vaginal area.  She called MD on call and was told to stop the coconut oil and come in today.  Onset of symptoms since 8/28 appointment. Denies new personal products or vaginal dryness.     No STD concerns. Urinary symptoms other than dysuria just noted today at the office.  She cannot say if this was urethra irritation or if pain was from vulva area.   Contraception is Hysterectomy. States she also has infection of the groin and has been using an antifungal powder and Desitin over the weekend.  Those areas are in the left groin.   O:  Healthy female WDWN Affect: normal, orientation x 3  Exam: no distress Abdomen: soft with a skin fungal infection of the left inguinal area Lymph node: no enlargement or tenderness Pelvic exam: External genital: normal female except known enlargement of right vulva and area of previous lesion is healing. BUS: negative Vagina: clear to none discharge noted.  Affirm taken.  UA: trace of leuk's   A: R/O Vaginitis, healing previous site on the right from over use of steroid cream  R/O UTI  Fungal infection of the left groin   P: Discussed findings of vaginitis and etiology. Discussed Aveeno or baking soda sitz bath for comfort. Avoid moist clothes or pads for extended period of time. Coconut Oil use for skin protection prior to activity can be used to external skin.  Rx: will have pt to go back on the coconut oil as previously directed.  She may continue with the antifungal powder to the groin along with Desitin.  Follow with Affirm and urine culture  RV prn

## 2015-09-25 NOTE — Patient Instructions (Addendum)
We will call with results of test.

## 2015-09-26 LAB — WET PREP BY MOLECULAR PROBE
CANDIDA SPECIES: NEGATIVE
Gardnerella vaginalis: NEGATIVE
TRICHOMONAS VAG: NEGATIVE

## 2015-09-26 LAB — POCT URINALYSIS DIPSTICK
Bilirubin, UA: NEGATIVE
Blood, UA: NEGATIVE
GLUCOSE UA: NEGATIVE
KETONES UA: NEGATIVE
Nitrite, UA: NEGATIVE
Protein, UA: NEGATIVE
Urobilinogen, UA: NEGATIVE
pH, UA: 5

## 2015-09-26 LAB — URINE CULTURE: Organism ID, Bacteria: 10000

## 2015-09-26 NOTE — Progress Notes (Signed)
Encounter reviewed by Dr. Aundria Rud.  I recommend that the patient have follow up of her diabetes which is likely a significant contributor to her chronic vulvitis.   Her last hemoglobin A1C that I can see was in January 2017.

## 2015-09-27 ENCOUNTER — Telehealth: Payer: Self-pay | Admitting: Certified Nurse Midwife

## 2015-09-27 NOTE — Telephone Encounter (Signed)
Message noted from Deborah Leonard, CNM. Encounter closed.  

## 2015-09-27 NOTE — Telephone Encounter (Signed)
Spoke with patient. Patient states she has been wearing her depends more often when she leaves the house and is having some external itching. Patient would like to know if it is okay to use coconut oil externally. Advised she may continue using coconut oil externally and internally as needed for relief. Encouraged patient to ensure she is wearing clean depends and cleanses vaginal area well after wearing depends. Also advised patient that she is keeping her vaginal area dry. Patient had most recent HgB A1c with Dr. Maudie Mercury on 08/14/2015, at that time was 6.7. Reports she is taking Metformin twice per day. Patient has a follow up appointment scheduled with Dr. Maudie Mercury on 11/12/2015. Advised of importance of keeping this appointment. Patient is agreeable. Patient will continue to use coconut oil as needed and will return to office on 10/03/2015 for recheck with [name]. She will return call with any questions or concerns prior to her appointment.  Routing to provider for final review. Patient agreeable to disposition. Will close encounter.

## 2015-09-27 NOTE — Telephone Encounter (Signed)
Patient wants to talk with the nurse. She states she is still itching.

## 2015-09-27 NOTE — Telephone Encounter (Signed)
Totally agree with plan

## 2015-09-28 ENCOUNTER — Telehealth: Payer: Self-pay

## 2015-09-28 NOTE — Telephone Encounter (Signed)
Spoke with patient at time of incoming call. Patient is calling to ask if our office can check her A1C level since she had this last checked in July. Patient is currently taking Metformin 500 mg BID which is prescribed by Dr.Kim. Patient is scheduled for follow up with Dr.Kim on 11/12/2015. Advised patient we can check her A1C level in the office, but if any adjustments need to be made to her medication this will need to be performed by Dr.Kim as she is the prescribing physician of her medication. Advised it is best to have her level checked with Dr.Kim in case adjustments are needed. She is agreeable and verbalizes understanding. Patient is concerned about making it to her appointment on 10/03/2015 with [name] due to the weather and chance for losing power. Advised patient if she is unable to make it to her appointment due to unsafe travels we will understand and she will need to contact the office to reschedule her appointment. She is worried about not being able to call the office if her power is out. Advised she may contact the office as soon as power returns if this occurs to let the office know what happened so that we can reschedule her appointment. She is agreeable and verbalizes understanding.  Routing to provider for final review. Patient agreeable to disposition. Will close encounter.

## 2015-09-29 ENCOUNTER — Other Ambulatory Visit: Payer: Self-pay | Admitting: Family Medicine

## 2015-10-01 DIAGNOSIS — H6121 Impacted cerumen, right ear: Secondary | ICD-10-CM | POA: Diagnosis not present

## 2015-10-02 ENCOUNTER — Telehealth: Payer: Self-pay | Admitting: Certified Nurse Midwife

## 2015-10-02 NOTE — Telephone Encounter (Signed)
Patient calling with concersn of weather and bowel movement X 2 with OTC meds. Advised can bring meds with her tomorrow to appointmen but this is GYN office that does not management GI issues. No GYN issues mentioned.  Routing to provider for final review. Will close encounter.

## 2015-10-02 NOTE — Telephone Encounter (Signed)
Patient is asking to talk with Regina Eck nurse about an otc medication she used and what she was told from the pharmacist about this medication.

## 2015-10-03 ENCOUNTER — Ambulatory Visit (INDEPENDENT_AMBULATORY_CARE_PROVIDER_SITE_OTHER): Payer: Medicare Other | Admitting: Certified Nurse Midwife

## 2015-10-03 ENCOUNTER — Encounter: Payer: Self-pay | Admitting: Certified Nurse Midwife

## 2015-10-03 VITALS — Ht 62.0 in

## 2015-10-03 DIAGNOSIS — B372 Candidiasis of skin and nail: Secondary | ICD-10-CM | POA: Diagnosis not present

## 2015-10-03 MED ORDER — FLUCONAZOLE 150 MG PO TABS: 150.0000 mg | ORAL_TABLET | Freq: Once | ORAL | 0 refills | Status: AC

## 2015-10-03 NOTE — Progress Notes (Signed)
Subjective:     Patient ID: Linda Thompson, female   DOB: 08-05-1955, 60 y.o.   MRN: [id number]  HPI 60 yo gopo single female here for recheck of vaginal area of thinness note on exam on 09/11/15. Has continued to use coconut oil to area as instructed. Patient feels this area is better. Also took Lopramide medication to help with constipation and had several stools that were tarry black stools. Currently waiting to have follow up colonoscopy for atypical polyp removed at colonoscopy this year. Patient has not been using poly medication per vagina per patient. Still using fungal powder in groin area. Denies any urinary issues today. No other health issues today.   Review of Systems  Constitutional: Negative.   Gastrointestinal: Negative for abdominal pain and rectal pain.  Genitourinary: Negative for difficulty urinating, frequency, pelvic pain, urgency, vaginal bleeding, vaginal discharge and vaginal pain.       Objective:   Physical Exam  Constitutional: She is oriented to person, place, and time. She appears well-developed and well-nourished.  Abdominal: Soft. Bowel sounds are normal. There is no tenderness.  Genitourinary: Vagina normal.    There is no rash, tenderness or lesion on the right labia. There is no rash, tenderness or lesion on the left labia. No erythema, tenderness or bleeding in the vagina. No vaginal discharge found.  Genitourinary Comments: Left groin area dry, no moisture.  Right groin area slightly moist from cream use, not tender to touch wet prep taken from area. Vulva area where thinning was occurring from overuse of hydrocortisone has resolved. Atrophy improving with coconut oil use.  Neurological: She is alert and oriented to person, place, and time.  Skin: Skin is warm and dry.  Psychiatric: She has a normal mood and affect. Her behavior is normal. Judgment normal.  Some limitations with comprehension, but no change   Wet prep of groin area on right: KOH,  Saline positive for yeast Area was shown to patient with mirror.    Assessment:     Vulva thinning has resolved from overuse of steroid to area. Normal appearance with atrophy noted. Yeast dermatitis in right groin area now, History of chronic problem. Right vulva mass due to sarcoidosis, no size change Black stools with medication use for constipation    Plan:     Discussed finding of improved area in vagina with coconut oil use for vaginal dryness. Stressed importance of no other medications or creams to be used in vagina or on vulva unless we recommend it. Patient repeated back recommendations. Continue coconut oil daily, prn. Discussed yeast dermatitis present again in groin area and stressed she should not use cream in this area, just her antifungal powder daily. Patient repeated back instructions for care. Will not use cream unless instructed to do so. Rx Diflucan see order instructions given at length and written Discussed the "black stools". She needs to contact Dr. Maudie Mercury for follow up, patient plans to go by there and make an appointment. She is aware that this may be problem due to her colonoscopy results and need for repeat in 10/17. Instructed to not use her bowel medication again without consulting with Dr. Maudie Mercury. Questions addressed.   Note to be forwarded to Dr. Maudie Mercury.

## 2015-10-04 ENCOUNTER — Encounter: Payer: Self-pay | Admitting: Family Medicine

## 2015-10-04 ENCOUNTER — Ambulatory Visit (INDEPENDENT_AMBULATORY_CARE_PROVIDER_SITE_OTHER): Payer: Medicare Other | Admitting: Family Medicine

## 2015-10-04 VITALS — BP 122/80 | HR 98 | Temp 97.4°F | Ht 62.0 in | Wt 191.4 lb

## 2015-10-04 DIAGNOSIS — R195 Other fecal abnormalities: Secondary | ICD-10-CM

## 2015-10-04 NOTE — Patient Instructions (Signed)
Please call your gastroenterologist to let them know about the dark stool you had and contact them immediately also if any further dark stool, blood in your stools or gastrointestinal concerns.

## 2015-10-04 NOTE — Progress Notes (Signed)
Pre visit review using our clinic review tool, if applicable. No additional management support is needed unless otherwise documented below in the visit note. 

## 2015-10-04 NOTE — Progress Notes (Signed)
HPI:  Acute visit for:  Dark stool: -seeing GI in [place] for GI polyps (per review recent EGD report numerous gastric polyps, one high grade glandular dysplasia and 5 additional); reports put on iron by him -struggles with chronic constipation -took laxative 4 days ago and saw small amount loose dark stool -normal bowels since -no hematochezia, abd pain, rectal pain, change in bowels otherwise, fevers, malaise, weakness -has follow up with GI in a few weeks  ROS: See pertinent positives and negatives per HPI.  Past Medical History:  Diagnosis Date  . Allergic rhinitis   . Allergy   . Anemia   . Chronic low back pain   . DDD (degenerative disc disease), lumbar   . GERD (gastroesophageal reflux disease)    w/ hx diaphragmatic hernia  . Glaucoma   . GLAUCOMA NOS 03/11/2006   Qualifier: Diagnosis of  By: Diona Browner MD, Amy    . Heart murmur   . Hx of fracture    R arm, foot  . Hyperlipemia   . Hypertension   . IBS (irritable bowel syndrome)   . Low back pain 11/22/2012  . Meningioma ([facility])    ant falx, stable on prior imaging  . Moderate intellectual disabilities   . Murmur   . Obesity   . Osteoarthrosis, unspecified whether generalized or localized, ankle and foot   . Sarcoidosis ([place])    difuse bony lesions and LDA, dx by biopsy in 2016  . Transaminitis   . Type II or unspecified type diabetes mellitus without mention of complication, not stated as uncontrolled     Past Surgical History:  Procedure Laterality Date  . CATARACT EXTRACTION    . COLONOSCOPY  04/15/2011  . ELBOW SURGERY     right elbow  . LIPOMA EXCISION Left [id number]   posterior left axilla  . TOTAL ABDOMINAL HYSTERECTOMY  03/05/1992   leiomyoma and cellular leiomyoma    Family History  Problem Relation Age of Onset  . Diabetes Mother   . Stroke Mother   . Hypertension Mother   . Stroke Father   . Diabetes Maternal Aunt   . Heart disease Maternal Aunt      (had pacemaker)  . Diabetes Maternal  Grandmother   . Stroke Maternal Grandmother   . Hypertension Maternal Grandmother   . Colon cancer Neg Hx     Social History   Social History  . Marital status: Single    Spouse name: N/A  . Number of children: 0  . Years of education: N/A   Occupational History  . Disabled (from arm fracture)    Social History Main Topics  . Smoking status: Never Smoker  . Smokeless tobacco: Never Used  . Alcohol use No  . Drug use: No  . Sexual activity: Not Currently    Birth control/ protection: Surgical     Comment: Hyst--TAH--unsure if has ovaries   Other Topics Concern  . None   Social History Narrative   Lives at [facility] (on [address] in [place])      [place] for arm fracture      Mental Retardation, but is able to drive to places she is comfortable with and prepare her own meals and manage most of her own affairs.      [name] (neighbor) is POA/HCPOA      Reports she gets regular exercise and tries to eat healthy     Current Outpatient Prescriptions:  .  acetaminophen (TYLENOL) 500 MG tablet,  Take 500 mg by mouth as needed (left ear pain). Reported on 04/18/2015, Disp: , Rfl:  .  diltiazem (CARDIZEM CD) 240 MG 24 hr capsule, TAKE ONE CAPSULE BY MOUTH EVERY DAY, Disp: 90 capsule, Rfl: 1 .  ferrous sulfate 325 (65 FE) MG tablet, TAKE 1 TABLET (325 MG TOTAL) BY MOUTH 2 (TWO) TIMES DAILY WITH A MEAL., Disp: 60 tablet, Rfl: 2 .  hydrochlorothiazide (MICROZIDE) 12.5 MG capsule, TAKE 1 CAPSULE BY MOUTH ONCE DAILY, Disp: 90 capsule, Rfl: 1 .  hydrocortisone 2.5 % ointment, APPLY TO AFFECTED AREA TWICE A DAY, Disp: , Rfl: 0 .  ipratropium (ATROVENT) 0.06 % nasal spray, PLACE 2 SPRAYS INTO BOTH NOSTRILS 4 (FOUR) TIMES DAILY., Disp: 45 mL, Rfl: 3 .  ketoconazole (NIZORAL) 2 % shampoo, APPLY TO AFFECTED AREA TWICE A WEEK AS DIRECTED, Disp: 120 mL, Rfl: 1 .  Lactobacillus (FLORAJEN ACIDOPHILUS PO), Take 1 tablet by mouth daily., Disp: ,  Rfl:  .  latanoprost (XALATAN) 0.005 % ophthalmic solution, Place 1 drop into both eyes at bedtime. , Disp: , Rfl:  .  liver oil-zinc oxide (DESITIN) 40 % ointment, Apply 1 application topically as needed for irritation., Disp: , Rfl:  .  loperamide (IMODIUM) 2 MG capsule, Take 2 mg by mouth as needed for diarrhea or loose stools (PRN diarrhea)., Disp: , Rfl:  .  loratadine (CLARITIN) 10 MG tablet, Take 10 mg by mouth daily., Disp: , Rfl:  .  metFORMIN (GLUCOPHAGE) 500 MG tablet, TAKE 1 TABLET BY MOUTH TWICE A DAY WITH A MEAL, Disp: 180 tablet, Rfl: 1 .  omeprazole (PRILOSEC) 20 MG capsule, TAKE 1 CAPSULE (20 MG TOTAL) BY MOUTH 2 (TWO) TIMES DAILY., Disp: 180 capsule, Rfl: 3 .  potassium chloride SA (KLOR-CON M20) 20 MEQ tablet, Take 1 tablet (20 mEq total) by mouth daily., Disp: 90 tablet, Rfl: 3 .  pravastatin (PRAVACHOL) 40 MG tablet, TAKE 1 TABLET BY MOUTH EVERY EVENING, Disp: 90 tablet, Rfl: 1 .  Propylene Glycol (SYSTANE BALANCE) 0.6 % SOLN, , Disp: , Rfl:   EXAM:  Vitals:   10/04/15 0922  BP: 122/80  Pulse: 98  Temp: 97.4 F (36.3 C)    Body mass index is 35.01 kg/m.  GENERAL: vitals reviewed and listed above, alert, oriented, appears well hydrated and in no acute distress  HEENT: atraumatic, conjunttiva clear, no obvious abnormalities on inspection of external nose and ears  NECK: no obvious masses on inspection  LUNGS: clear to auscultation bilaterally, no wheezes, rales or rhonchi, good air movement  CV: HRRR, no peripheral edema  MS: moves all extremities without noticeable abnormality  ABD: BS+, soft, NTTP  DRE: no masses appreciated; no BRB on exam glove, hemoccult +  PSYCH: pleasant and cooperative, no obvious depression or anxiety  ASSESSMENT AND PLAN:  Discussed the following assessment and plan:  Dark stools  -no other symptoms and occurred once - advised her to notify her gastroenterologist and contact them immediately in the event of recurrent or  large volume melena, hematochezia, abd pain or other concerns -did discuss potential for false + hemoccult given she is taking iron -Patient advised to return or notify a doctor immediately if symptoms worsen or persist or new concerns arise.  Patient Instructions  Please call your gastroenterologist to let them know about the dark stool you had and contact them immediately also if any further dark stool, blood in your stools or gastrointestinal concerns.   [name] R., DO

## 2015-10-05 ENCOUNTER — Telehealth: Payer: Self-pay | Admitting: Certified Nurse Midwife

## 2015-10-05 NOTE — Telephone Encounter (Signed)
Patient is calling with questions regarding her medication.

## 2015-10-07 NOTE — Progress Notes (Signed)
Encounter reviewed [name], MD   

## 2015-10-11 ENCOUNTER — Ambulatory Visit: Payer: Medicare Other | Admitting: Gastroenterology

## 2015-10-15 NOTE — Telephone Encounter (Signed)
Patient wants to speak with the nurse. She is having itching again.

## 2015-10-15 NOTE — Telephone Encounter (Signed)
Yes and if she needs to be seen - Ms Linda Thompson is here tomorrow.

## 2015-10-15 NOTE — Telephone Encounter (Signed)
Spoke with patient. Patient reports vaginal itching that began 2 days ago. Patient states she stopped using coconut oil 2 days ago. Patient states she took Diflucan as prescribed at last OV 10/03/15. Patient reports not using cream in vagina or vulva as directed per Melvia Heaps. Advised patient to use the coconut oil PRN as directed per Melvia Heaps and if the itching continues to return call to office. Patient is agreeable.     Kem Boroughs, do you agree with recommendations?    CC: Melvia Heaps, CNM

## 2015-10-17 ENCOUNTER — Ambulatory Visit (INDEPENDENT_AMBULATORY_CARE_PROVIDER_SITE_OTHER): Payer: Medicare Other | Admitting: Ophthalmology

## 2015-10-17 DIAGNOSIS — H35033 Hypertensive retinopathy, bilateral: Secondary | ICD-10-CM

## 2015-10-17 DIAGNOSIS — I10 Essential (primary) hypertension: Secondary | ICD-10-CM

## 2015-10-17 DIAGNOSIS — E11319 Type 2 diabetes mellitus with unspecified diabetic retinopathy without macular edema: Secondary | ICD-10-CM

## 2015-10-17 DIAGNOSIS — E113292 Type 2 diabetes mellitus with mild nonproliferative diabetic retinopathy without macular edema, left eye: Secondary | ICD-10-CM

## 2015-10-17 DIAGNOSIS — H43813 Vitreous degeneration, bilateral: Secondary | ICD-10-CM

## 2015-10-17 DIAGNOSIS — H33302 Unspecified retinal break, left eye: Secondary | ICD-10-CM

## 2015-10-19 ENCOUNTER — Encounter: Payer: Self-pay | Admitting: Certified Nurse Midwife

## 2015-10-19 ENCOUNTER — Ambulatory Visit (INDEPENDENT_AMBULATORY_CARE_PROVIDER_SITE_OTHER): Payer: Medicare Other | Admitting: Certified Nurse Midwife

## 2015-10-19 ENCOUNTER — Telehealth: Payer: Self-pay | Admitting: Certified Nurse Midwife

## 2015-10-19 VITALS — BP 110/70 | HR 70 | Resp 16 | Ht 62.0 in | Wt 191.0 lb

## 2015-10-19 DIAGNOSIS — N952 Postmenopausal atrophic vaginitis: Secondary | ICD-10-CM | POA: Diagnosis not present

## 2015-10-19 DIAGNOSIS — B372 Candidiasis of skin and nail: Secondary | ICD-10-CM

## 2015-10-19 MED ORDER — FLUCONAZOLE 150 MG PO TABS: 150.0000 mg | ORAL_TABLET | Freq: Once | ORAL | 0 refills | Status: AC

## 2015-10-19 NOTE — Progress Notes (Signed)
60 y.o. Single Caucasian female [name] here with complaint of vaginal symptoms of itching inside vulva," in her usual spot". Describes discharge as normal. Has vaginal dryness and was using coconut oil consistently and then reduced to just when itching.Has had a couple of urinary incontinence episodes with Depends, but has cleaned after occurrence. Scheduled for her repeat colonoscopy in one week," a little worried about that" but has her friend who will be with her.. Onset of symptoms 3-4 days ago. Denies new personal products..Urinary symptoms  None other than above. No other health issues today .    O:Healthy female WDWN Affect: normal, orientation x 3  Exam: Abdomen:soft, non tender Inguinal Lymph node: no enlargement or tenderness Pelvic exam: External genital: normal female with atrophic appearance and enlarged right vulva, known due sarcoidosis site, no change in appearance. Groin creases: noted redness with yeast type odor noted again bilateral, some exudate with some cracking noted. Wet prep taken BUS: negative Vagina: scant discharge noted. 4.0 Wet prep taken,  Cervix: absent  Uterus:absent Adnexa no masses or fullness noted   Wet Prep results:KOH,Saline vaginal negative for pathogens, Skin: positive for yeast   A:Normal pelvic exam Yeast dermatitis Atrophic vaginitis with some loss of pigmentation, no change in appearance Sarcoidosis right vulva mass no change noted Colonoscopy for atypia scheduled in one week and she has support system in place.    P:Discussed findings of skin dermatitis again in groin area and etiology. Discussed importance of keeping skin in groin creases dry and using antifungal powder 2 shakes in area daily. Do rub on skin but dust on. Patient has 2 containers of antifungal powder, no RX needed. Voiced understanding and repeated back  Instructions. Discussed atrophic vaginitis and importance of coconut oil on daily basis to prevent itching, vaginal  infections and irritation . Patient will resume use and can also use prn in addition to daily. Patient feels good about visit and what to do .  Rv prn

## 2015-10-19 NOTE — Telephone Encounter (Signed)
Patient says she's itching again.

## 2015-10-19 NOTE — Telephone Encounter (Signed)
Spoke with patient. Patient states that she is having increased external vaginal itching and irritation. Reports she was using coconut oil and stopped when her itching was relieved. Patient called into the office on 10/05/2015 and was advised to restart her coconut oil for itching. Reports she did restart using the coconut oil, but is not having relief. Had an accident in her depends yesterday and feels this may have caused some irritation. Reports she cleaned and dried her vaginal area well after urinating in her depends. Did dispose of the soiled depends. Advised patient with continued irritation with using coconut oil and concerns she will need to be seen in the office for further evaluation. Patient is agreeable. Appointment scheduled for today 10/19/2015 at 11 am with [name]. Patient is agreeable to date and time.  Routing to provider for final review. Patient agreeable to disposition. Will close encounter.

## 2015-10-19 NOTE — Patient Instructions (Signed)

## 2015-10-19 NOTE — Progress Notes (Signed)
Encounter reviewed Cystal Shannahan, MD   

## 2015-10-22 ENCOUNTER — Telehealth: Payer: Self-pay | Admitting: Certified Nurse Midwife

## 2015-10-22 NOTE — Telephone Encounter (Signed)
Patient called requesting to speak with the nurse about "how often to use the olive oil."

## 2015-10-22 NOTE — Telephone Encounter (Signed)
Spoke with patient. Patient request to review how often coconut oil can be used. Reviewed recommendations provided at [place] dated 10/19/15 as seen below. Patient verbalizes understanding.        Discussed atrophic vaginitis and importance of coconut oil on daily basis to prevent itching, vaginal infections and irritation . Patient will resume use and can also use prn in addition to daily. Patient feels good about visit and what to do .   Routing to provider for final review. Patient is agreeable to disposition. Will close encounter.     CC: Melvia Heaps, CNM

## 2015-10-23 ENCOUNTER — Ambulatory Visit (INDEPENDENT_AMBULATORY_CARE_PROVIDER_SITE_OTHER): Payer: Medicare Other | Admitting: Certified Nurse Midwife

## 2015-10-23 ENCOUNTER — Encounter: Payer: Self-pay | Admitting: Certified Nurse Midwife

## 2015-10-23 VITALS — Ht 62.0 in

## 2015-10-23 DIAGNOSIS — B372 Candidiasis of skin and nail: Secondary | ICD-10-CM | POA: Diagnosis not present

## 2015-10-23 DIAGNOSIS — L900 Lichen sclerosus et atrophicus: Secondary | ICD-10-CM | POA: Diagnosis not present

## 2015-10-23 DIAGNOSIS — R300 Dysuria: Secondary | ICD-10-CM

## 2015-10-23 LAB — POCT URINALYSIS DIPSTICK
Blood, UA: NEGATIVE
Glucose, UA: NEGATIVE
KETONES UA: NEGATIVE
NITRITE UA: NEGATIVE
PH UA: 6
PROTEIN UA: NEGATIVE
UROBILINOGEN UA: NEGATIVE

## 2015-10-23 NOTE — Progress Notes (Signed)
60 y.o. Single Caucasian female [name] here with complaint of vaginal symptoms of itching, burning, and increase discharge. Describes discharge as none. Onset of symptoms 2 days ago. Denies new personal products or vaginal dryness. Has been using coconut oil daily. Patient also has been on Diflucan for yeast dermatitis and took second dose today. Has been putting antifungal powder in groin area, but laying down to do, so can not see where she is placing it. She has noted increase itching inside vulva area at clitoral hood. No new personal products. Depends working well for accidents and cleans well after occurs. Colonoscopy scheduled for 10/26/15, she is worried about, but her friend is taking her and will stay with her. No other health issues today. Marland Kitchen    O:Healthy female WDWN Affect: normal, orientation x 3  Exam: Skin: warm and dry CVAT: negative bilateral Abdomen:negative suprapubic, soft  Inguinal Lymph nodes: no enlargement or tenderness Pelvic exam: External genital: normal female, with atrophic appearance, groin area rash noted previously has decrease on left side, but right side has increased with slight exudate, powder residue noted only on left and very little noted on right, non tender to touch. Wet prep taken right vulva mass no change, Lichen Sclerosis flare noted under clitoral hood area, area of itching. BUS: negative Vagina: normal appearing vaginal  discharge noted. Ph:4.0  ,Wet prep taken  Cervix:absent Uterus:absent Adnexa:absent, no masses or fullness noted   Wet Prep results:KOH,Saline negative for pathogens Skin prep in groin area positive for yeast   A:Normal pelvic exam Lichen Sclerosis flare Yeast dermatitis under treatment Right know vulva mass sarcoidosis site Colonoscopy soon for atypia No UTI symptoms noted Moderate intellectual diability     P:Discussed findings of Lichen Sclerosis and etiology and her history with. Discussed restart of her cortisone  cream ( does not use Clobetasol due to cost) only the area shown with the mirror. Wrote down instructions and her medication that she has to restart twice daily, very thinly. Patient voiced understanding.  Also discussed finding of resolving yeast dermatitis on left and not on right. Patient lying down to put on her and can not see to apply correctly. Discussed standing and hand patient realize she can see area better standing and show how to shake antifungal powder in that area. Instructed bid use. Wished her well with her colonoscopy. Questions addressed.   Rv prn

## 2015-10-23 NOTE — Patient Instructions (Signed)
Lichen Sclerosus Lichen sclerosus is a skin problem. It can happen on any part of the body. It happens most often in the anal or genital areas. It can cause itching and discomfort. Treatment can help to control symptoms. This skin problem is not passed from one person to another (not contagious). The cause is not known. HOME CARE  Take over-the-counter and prescription medicines only as told by your doctor.  Use creams or ointments as told by your doctor.  Do not scratch the affected areas of skin.  Women should keep the vagina as clean and dry as they can.  Keep all follow-up visits as told by your doctor. This is important. GET HELP IF:  Your redness, swelling, or pain gets worse.  You have fluid, blood, or pus coming from the area.  You have new patches (lesions) on your skin.  You have pain or burning when you pee (urinate).  You have pain during sex.   This information is not intended to replace advice given to you by your health care provider. Make sure you discuss any questions you have with your health care provider.   Document Released: 12/20/2007 Document Revised: 09/27/2014 Document Reviewed: 04/03/2014 Elsevier Interactive Patient Education 2016 [employer]  

## 2015-10-24 ENCOUNTER — Telehealth: Payer: Self-pay | Admitting: Certified Nurse Midwife

## 2015-10-24 NOTE — Telephone Encounter (Signed)
Left message to call Blaine Hari at [phone]. 

## 2015-10-24 NOTE — Progress Notes (Signed)
Encounter reviewed [name], MD   

## 2015-10-24 NOTE — Telephone Encounter (Signed)
Spoke with patient. Patient states that she went to the pharmacy today and bought more Hydrocortisone cream 1 % as this is what she had on hand at home. Reports she used cream last night and took Diflucan 150 mg. Advised she will need to continue using the hydrocortisone cream 1% twice a day and needs to thinly apply it to the affect area. Patient is agreeable and verbalizes understanding.  Routing to provider for final review. Patient agreeable to disposition. Will close encounter.

## 2015-10-24 NOTE — Telephone Encounter (Signed)
Patient called and would like to speak with a nurse about a cream she is to use.

## 2015-10-25 ENCOUNTER — Telehealth: Payer: Self-pay | Admitting: Certified Nurse Midwife

## 2015-10-25 NOTE — Telephone Encounter (Signed)
Spoke with patient. Patient was seen in the office on 10/23/2015 and was advised to use hydrocortisone cream BID for vaginal irritation. Patient also took 1 Difucan on 10/23/2015. Reports when she uses the restroom and her urine touches her skin it burns.  Advised this is coming from vaginal irritation because the skin is inflamed. Advised she will need to keep the tissue clean and dry and continue to apply hydrocortisone cream BID in a thin layer. Advise this will help to heal the tissue and decrease the symptoms she is experiencing. Patient is agreeable and verbalizes understanding. Able to repeat back recommendations to RN. 22 minute call to patient.  Routing to provider for final review. Patient agreeable to disposition. Will close encounter.

## 2015-10-25 NOTE — Telephone Encounter (Signed)
Patient wants to discuss her medication with the nurse.

## 2015-10-26 ENCOUNTER — Encounter: Payer: Self-pay | Admitting: Family Medicine

## 2015-10-26 DIAGNOSIS — D131 Benign neoplasm of stomach: Secondary | ICD-10-CM | POA: Diagnosis not present

## 2015-10-26 DIAGNOSIS — Z880 Allergy status to penicillin: Secondary | ICD-10-CM | POA: Diagnosis not present

## 2015-10-26 DIAGNOSIS — Z7982 Long term (current) use of aspirin: Secondary | ICD-10-CM | POA: Diagnosis not present

## 2015-10-26 DIAGNOSIS — D509 Iron deficiency anemia, unspecified: Secondary | ICD-10-CM | POA: Diagnosis not present

## 2015-10-26 DIAGNOSIS — Z9849 Cataract extraction status, unspecified eye: Secondary | ICD-10-CM | POA: Diagnosis not present

## 2015-10-26 DIAGNOSIS — Z888 Allergy status to other drugs, medicaments and biological substances status: Secondary | ICD-10-CM | POA: Diagnosis not present

## 2015-10-26 DIAGNOSIS — Z7984 Long term (current) use of oral hypoglycemic drugs: Secondary | ICD-10-CM | POA: Diagnosis not present

## 2015-10-26 DIAGNOSIS — D649 Anemia, unspecified: Secondary | ICD-10-CM | POA: Diagnosis not present

## 2015-10-26 DIAGNOSIS — K219 Gastro-esophageal reflux disease without esophagitis: Secondary | ICD-10-CM | POA: Diagnosis not present

## 2015-10-26 DIAGNOSIS — M1990 Unspecified osteoarthritis, unspecified site: Secondary | ICD-10-CM | POA: Diagnosis not present

## 2015-10-26 DIAGNOSIS — J309 Allergic rhinitis, unspecified: Secondary | ICD-10-CM | POA: Diagnosis not present

## 2015-10-26 DIAGNOSIS — Z886 Allergy status to analgesic agent status: Secondary | ICD-10-CM | POA: Diagnosis not present

## 2015-10-26 DIAGNOSIS — Z79899 Other long term (current) drug therapy: Secondary | ICD-10-CM | POA: Diagnosis not present

## 2015-10-26 DIAGNOSIS — E785 Hyperlipidemia, unspecified: Secondary | ICD-10-CM | POA: Diagnosis not present

## 2015-10-26 DIAGNOSIS — Z79891 Long term (current) use of opiate analgesic: Secondary | ICD-10-CM | POA: Diagnosis not present

## 2015-10-26 DIAGNOSIS — E119 Type 2 diabetes mellitus without complications: Secondary | ICD-10-CM | POA: Diagnosis not present

## 2015-10-26 DIAGNOSIS — K317 Polyp of stomach and duodenum: Secondary | ICD-10-CM | POA: Diagnosis not present

## 2015-10-26 DIAGNOSIS — I10 Essential (primary) hypertension: Secondary | ICD-10-CM | POA: Diagnosis not present

## 2015-10-26 DIAGNOSIS — Z961 Presence of intraocular lens: Secondary | ICD-10-CM | POA: Diagnosis not present

## 2015-10-29 ENCOUNTER — Telehealth: Payer: Self-pay | Admitting: Certified Nurse Midwife

## 2015-10-29 ENCOUNTER — Ambulatory Visit (INDEPENDENT_AMBULATORY_CARE_PROVIDER_SITE_OTHER): Payer: Medicare Other | Admitting: Nurse Practitioner

## 2015-10-29 ENCOUNTER — Encounter: Payer: Self-pay | Admitting: Nurse Practitioner

## 2015-10-29 VITALS — BP 124/76 | HR 72 | Ht 62.0 in | Wt 191.0 lb

## 2015-10-29 DIAGNOSIS — N898 Other specified noninflammatory disorders of vagina: Secondary | ICD-10-CM | POA: Diagnosis not present

## 2015-10-29 NOTE — Telephone Encounter (Signed)
Returned call to patient. Patient states her vaginal itching seems to be getting worse. She is using the cream as instructed by Melvia Heaps, CNM with no relief. Patient aware Debbi is out of the office today, so she will have to be seen by another provider. Office visit scheduled for 10/29/15 at 1115 with Kem Boroughs, FNP. Patient agreeable to date and time of appointment.    Routing to provider for final review. Patient agreeable to disposition. Will close encounter.    Cc Melvia Heaps, CNM

## 2015-10-29 NOTE — Progress Notes (Signed)
60 y.o. Single Caucasian female [name] here for follow up of vaginal irritation.  States these vaginal symptoms just started this am. Some itching or slight burning on the right lower vulva where previous lesion has been.  Denies vaginal discharge or urinary symptoms. She brings with her the OTC hydrocortisone that she was instructed to use top of vulva for itching - but has not started this.  She was treated for yeast on 10/23/15.  On 7/28 she had EDG for anemia and was found to have 1 gastric polyp with high grade dysplasia and 5 others that was hyperplastic.   She had endoscopy for high grade dysplasia of gastric polyps on Friday 10/6 and did well.  She was told to restart iron.  She feels well since then.  She did stay with a neighbor. Denies nausea/ vomiting or bleeding. No other constitutional symptoms.      O: Healthy WD,WN female Affect: normal without distress Skin: warm and dry Abdomen: soft, non tender Pelvic exam: external vulvar lesion which is known does maybe look a little more irritated from scratching this am.  No vaginal discharge. At the groin areas right > left she still has external yeast.  Powder - Nystatin is present.  At the top of labia areas are hypopigmented from [facility] but no evidence of irritation or flare.  A: History of LSA - no current flare  Known history of right vulva mass as sarcoidosis site  Know lesion right inner labia - site of current irritation  History of gastric polyp found 7/28 with high grade dysplasia and resection done 10/26/15    P:  Discussed findings of irritation and advised not to use steroid creams to this area as this was the cause of original lesion.  She has Desitin on her med list and feels sure this is what she has at home,  Written instructions for her to use this twice a day until healed this week.   Labs   Instructions given regarding: home instructions and name of medication.  RV

## 2015-10-29 NOTE — Telephone Encounter (Signed)
Patient is still itching and feels it is worse. She wants to speak with the nurse.

## 2015-10-29 NOTE — Patient Instructions (Addendum)
Use the Desitan ointment to the right side where itching 2 times a day.

## 2015-10-30 NOTE — Progress Notes (Signed)
Encounter reviewed by Dr. Niel Peretti Amundson C. Silva.  

## 2015-10-31 ENCOUNTER — Telehealth: Payer: Self-pay | Admitting: Certified Nurse Midwife

## 2015-10-31 NOTE — Telephone Encounter (Signed)
Patient calling requesting to speak with the nurse about the cream she is taking.

## 2015-10-31 NOTE — Telephone Encounter (Signed)
Spoke with patient. Patient was seen in the office on 10/29/2015 with Kem Boroughs, FNP. She was advised to use Desitin cream BID apply to area of irritation. Reports she has started using the Desitin cream and feels the area is getting a little better. Asking if she needs to return to see [name]. Advised at this time she does not need to return to see [name] unless her symptoms are worsening or she has developed new symptoms. Denies any worsening or new symptoms. Advised to continue using Desitin cream BID until the area has healed. Advised this can take up to 1 week. Patient is agreeable and will return call with any new symptoms or concerns.  Routing to provider for final review. Patient agreeable to disposition. Will close encounter.

## 2015-11-01 ENCOUNTER — Other Ambulatory Visit: Payer: Self-pay | Admitting: Family Medicine

## 2015-11-01 ENCOUNTER — Telehealth: Payer: Self-pay | Admitting: Certified Nurse Midwife

## 2015-11-01 NOTE — Telephone Encounter (Signed)
Spoke with patient. Patient was seen in the office on 10/29/2015 for vaginal irritation with Kem Boroughs, FNP. Patient states that she has been using Desitin cream twice per day. Has been applying the cream to the inside of her labia on the right side. Reports the irritation is relieved with using Desitin. Feels the left side of her labia is now becoming irritated. The patient is unable to see the area. Has not applied Desitin cream to the left side of her labia. Advised she apply the Desitin cream to both sides of the labia BID. Advised if she feels the area is worsening or not getting better with use of Desitin cream she will need to be seen in the office for further evaluation. Patient is agreeable and verbalizes understanding.  Routing to covering provider for final review. Patient agreeable to disposition. Will close encounter.

## 2015-11-01 NOTE — Telephone Encounter (Signed)
Patient called and says she thinks she may still have the "problem"  She is using the cream but she thinks it has moved to the other side.

## 2015-11-05 ENCOUNTER — Telehealth: Payer: Self-pay | Admitting: Certified Nurse Midwife

## 2015-11-05 NOTE — Telephone Encounter (Signed)
Spoke with patient. Patient states she has been using Desitin cream twice a day and is feeling much better. Denies any vaginal irritation or itching. Asking if she will need to continue using Desitin cream at this time. Advised as long as she is feeling better and is no longer having any symptoms she may discontinue the use of Desitin cream at this time. Asking if she may restart the use of coconut oil daily. Advised this is safe to restart. Advised if symptoms return or she has any concerns she will need to contact the office. Patient is agreeable and verbalizes understanding.  Routing to provider for final review. Patient agreeable to disposition. Will close encounter.

## 2015-11-05 NOTE — Telephone Encounter (Signed)
Left message to call Corlene Sabia at [phone]. 

## 2015-11-05 NOTE — Telephone Encounter (Signed)
Patient wants to speak with the nurse. No information given. °

## 2015-11-06 ENCOUNTER — Ambulatory Visit (INDEPENDENT_AMBULATORY_CARE_PROVIDER_SITE_OTHER): Payer: Medicare Other | Admitting: Certified Nurse Midwife

## 2015-11-06 ENCOUNTER — Telehealth: Payer: Self-pay | Admitting: Certified Nurse Midwife

## 2015-11-06 ENCOUNTER — Encounter: Payer: Self-pay | Admitting: Certified Nurse Midwife

## 2015-11-06 VITALS — BP 110/70 | HR 70 | Resp 16 | Ht 62.0 in | Wt 193.0 lb

## 2015-11-06 DIAGNOSIS — B372 Candidiasis of skin and nail: Secondary | ICD-10-CM

## 2015-11-06 NOTE — Telephone Encounter (Signed)
Spoke with patient. Patient states that she placed coconut oil vaginally last night and had a sharp shooting in her left labia. Denies any vaginal pain or irritation today. Patient is very concerned as this is a new symptom for her. Requesting to be seen today for evaluation. Appointment scheduled for today 11/06/2015 at 11 am with [name]. Patient is agreeable to date and time.  Routing to provider for final review. Patient agreeable to disposition. Will close encounter.

## 2015-11-06 NOTE — Progress Notes (Signed)
Encounter reviewed [name], MD   

## 2015-11-06 NOTE — Progress Notes (Signed)
60 y.o. Single Caucasian female [name] here with complaint of sharp pain with applying coconut oil inside left labia, occurred only one time. Denies any vaginal bleeding or vaginal pain. Does not hurt now. "only just once". Patient completed Destin use as instructed for protection with urination. Denies any vaginal itching or burning. Using Desinex powder for yeast dermatitis in groin area once daily. "Feels so much better". No other health concerns today.  O:Healthy female WDWN Affect: normal, orientation x 3  Exam:Skin: warm and dry Lymph node: no enlargement or tenderness Pelvic exam: External genital: normal female, no lesions, no tears or bleeding noted, non tender. No point of pain when touched noted. Atrophic appearance, no scaling or exudate or Lichen flare noted. " Nothing hurts now". Right vulva mass still present non tender, appears smaller in size Groin area creases: faint pink, drying appearance and no moisture now. Powder residue present, non tender BUS: negative Vagina: scant discharge noted. ,Wet prep not taken,  Declines pelvic exam   A:Normal external female exam History of yeast dermatitis chronic, resolving Atrophic Vaginitis with coconut oil use Lichen Sclerosis flare resolved History of vulva mass sarcoidosis site, reduction in size noted   P:Discussed normal findings of vulva and vaginal entrance. No areas of concern. Patient relieved.Discussed continue use of coconut oil for vaginal dryness. Discussed she may have rubbed to hard and touched a nerve area that caused the pain. Area shown to patient in mirror and normal findings. Questions addressed at length..   Rv prn.

## 2015-11-06 NOTE — Telephone Encounter (Signed)
Patient states she wants to come in today for itching.

## 2015-11-06 NOTE — Patient Instructions (Addendum)
Be sure you use coconut oil for dryness and be careful when putting on. Destin use for irritation only Hydrocortisone for itching. Only Continue powder use for in between leg creases.

## 2015-11-07 ENCOUNTER — Telehealth: Payer: Self-pay | Admitting: Certified Nurse Midwife

## 2015-11-07 NOTE — Telephone Encounter (Signed)
Spoke with patient. Patient states appointment scheduled for 11/08/15  At 10:00 am with Melvia Heaps, CNM. Patient states she was just calling back to schedule appt. Verified appointment date and time. Patient verbalizes understanding and is agreeable.    Routing to provider for final review. Patient is agreeable to disposition. Will close encounter.

## 2015-11-07 NOTE — Telephone Encounter (Signed)
Spoke with patient. Patient states "It feels like I am going to start itching again." Patient denies any current itching or irritation. "It may just be my nerves." Asking if she needs to apply any Hydrocortisone or Desitin cream to the area. Advised if she is not having any itching or irritation does not need to restart cream at this time. Advised to monitor the area and if itching returns and is persistent or if she develops any new symptoms she will need to contact the office for further recommendationas or be seen for re-evaluation. Patient is agreeable.  Routing to provider for final review. Patient agreeable to disposition. Will close encounter.

## 2015-11-07 NOTE — Telephone Encounter (Signed)
Patient is asking to talk with Dr.Leonrd's nurse again.

## 2015-11-07 NOTE — Telephone Encounter (Signed)
Spoke with patient. Patient was seen in the office yesterday 11/06/2015 with Linda Thompson CNM. Seen for evaluation of sharp pain when applying coconut oil to her labia on 11/05/2015. This occurred one time. States she is not having any current problems. Patient is concerned that if her itching returns she will not know whether to use Hydrocortisone cream or Desitin cream. "I do not want to put it in the wrong place and make things worse." Advised patient if itching returns and is externally she may use Hydrocortisone cream. Advised this can not be used internally. Advised if this persists or if she has any questions about which cream to use she may contact the office so that we can ensure she is using the correct cream and applying it appropriately. Patient is agreeable and verbalizes understanding.  Routing to provider for final review. Patient agreeable to disposition. Will close encounter.

## 2015-11-07 NOTE — Telephone Encounter (Signed)
Patient wants to speak with the nurse. She has some questions for the nurse.

## 2015-11-07 NOTE — Telephone Encounter (Signed)
Patient asked to talk to [facility] nurse again stating she started itched again. Patient decided to come in for an appointment tomorrow at 10:00am with Melvia Heaps, CNM.

## 2015-11-08 ENCOUNTER — Ambulatory Visit (INDEPENDENT_AMBULATORY_CARE_PROVIDER_SITE_OTHER): Payer: Medicare Other | Admitting: Certified Nurse Midwife

## 2015-11-08 ENCOUNTER — Encounter: Payer: Self-pay | Admitting: Certified Nurse Midwife

## 2015-11-08 VITALS — BP 120/72 | HR 70 | Resp 16 | Ht 62.0 in | Wt 193.0 lb

## 2015-11-08 DIAGNOSIS — Z79899 Other long term (current) drug therapy: Secondary | ICD-10-CM

## 2015-11-08 NOTE — Progress Notes (Signed)
60 y.o. Single Caucasian female [name] here asking for clarification of medications she has on hand and which ones to use. Patient brought all vaginal medications with her. She also had her written sheet she was given when she was seen 2 days ago. Denies any vaginal problems,itching or dryness or urinary issues." I don't need to be seen, just help with medicines". denies any vaginal itching, burning or pain or bleeding. 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 No other concerns today.  O:Healthy female WDWN Affect: normal, orientation x 3 Comprehension: fair, had patient read instructions and she accents the letters for identification  Exam:not needed  A:History of Lichen Sclerosis with hydrocortisone use for flares and vaginal itching as needed or bid History of vulva irritation due to depends with Destin use for comfort as needed. Atrophic vaginitis/vulvitis with Coconut oil use daily for dryness. Chronic yeast dermatitis in groin folds with Desenex antifungal powder use once daily Mentally challenged  P: Patient removed all medications to review and opened her printed directions from previous visit with me and we used letter recognition on boxes for problem on instructions. Patient repeated back and picked up each to identify the correct one for the problem if it occurs. CMA had circled itch on hydrocortisone for work recognition. Patient reads, but difficulty with comprehension. Patient felt reassured that she knew which to use now. Encouraged patient to leave out only the powder and the coconut oil for daily use, to decrease confusion. Aware she can call if concerns.  22 minutes face to face regarding medication use.

## 2015-11-09 ENCOUNTER — Telehealth: Payer: Self-pay

## 2015-11-09 NOTE — Telephone Encounter (Signed)
Spoke with patient at time of incoming call. Patient would like to know how often she needs to use the Hydrocortisone cream if she begins to have itching. States that she does not have itching at this time. Advised she may use Hydrocortisone for external itching only. Advised she may apply this twice per day in a thin layer if itching begins. If she is not itching does not need to use Hydrocortisone cream at this time. Patient is able to repeat instructions back to RN.  Routing to provider for final review. Patient agreeable to disposition. Will close encounter.

## 2015-11-12 ENCOUNTER — Ambulatory Visit (INDEPENDENT_AMBULATORY_CARE_PROVIDER_SITE_OTHER): Payer: Medicare Other | Admitting: Family Medicine

## 2015-11-12 DIAGNOSIS — E785 Hyperlipidemia, unspecified: Secondary | ICD-10-CM | POA: Diagnosis not present

## 2015-11-12 DIAGNOSIS — F71 Moderate intellectual disabilities: Secondary | ICD-10-CM

## 2015-11-12 DIAGNOSIS — E119 Type 2 diabetes mellitus without complications: Secondary | ICD-10-CM

## 2015-11-12 DIAGNOSIS — I10 Essential (primary) hypertension: Secondary | ICD-10-CM | POA: Diagnosis not present

## 2015-11-12 DIAGNOSIS — D869 Sarcoidosis, unspecified: Secondary | ICD-10-CM

## 2015-11-12 DIAGNOSIS — K317 Polyp of stomach and duodenum: Secondary | ICD-10-CM

## 2015-11-12 NOTE — Progress Notes (Signed)
HPI:  Linda Thompson is a pleasant 60 yo with significant mental retardation, diabetes, HTN, HLD, GERD, Sarcoidosis and morbid obesity here for follow up. She tends to be anxious about her health and can become fixated on a problem. She seeks medical care frequently. She is here for regular follow up. The computers were down during her visit. She reported that she has been followed closely by the gastroenterologist at [facility] for the polyps. Recent labs from there reviewed. Hgb and ferritin from 10/26/15. No CP, SOB, DOE, melena, hematochezia, pain complaints. Reviewed again the MRI head from earlier this year to see if she would want to do CT for further eval. Again declined.  ROS: See pertinent positives and negatives per HPI.  Past Medical History:  Diagnosis Date  . Allergic rhinitis   . Allergy   . Anemia   . Chronic low back pain   . DDD (degenerative disc disease), lumbar   . GERD (gastroesophageal reflux disease)    w/ hx diaphragmatic hernia  . Glaucoma   . GLAUCOMA NOS 03/11/2006   Qualifier: Diagnosis of  By: Diona Browner MD, Amy    . Heart murmur   . Hx of fracture    R arm, foot  . Hyperlipemia   . Hypertension   . IBS (irritable bowel syndrome)   . Low back pain 11/22/2012  . Meningioma ([place])    ant falx, stable on prior imaging  . Moderate intellectual disabilities   . Murmur   . Obesity   . Osteoarthrosis, unspecified whether generalized or localized, ankle and foot   . Sarcoidosis ([place])    difuse bony lesions and LDA, dx by biopsy in 2016  . Transaminitis   . Type II or unspecified type diabetes mellitus without mention of complication, not stated as uncontrolled     Past Surgical History:  Procedure Laterality Date  . CATARACT EXTRACTION    . COLONOSCOPY  04/15/2011  . ELBOW SURGERY     right elbow  . LIPOMA EXCISION Left [id number]   posterior left axilla  . TOTAL ABDOMINAL HYSTERECTOMY  03/05/1992   leiomyoma and cellular leiomyoma    Family History   Problem Relation Age of Onset  . Diabetes Mother   . Stroke Mother   . Hypertension Mother   . Stroke Father   . Diabetes Maternal Aunt   . Heart disease Maternal Aunt      (had pacemaker)  . Diabetes Maternal Grandmother   . Stroke Maternal Grandmother   . Hypertension Maternal Grandmother   . Colon cancer Neg Hx     Social History   Social History  . Marital status: Single    Spouse name: N/A  . Number of children: 0  . Years of education: N/A   Occupational History  . Disabled (from arm fracture)    Social History Main Topics  . Smoking status: Never Smoker  . Smokeless tobacco: Never Used  . Alcohol use No  . Drug use: No  . Sexual activity: Not Currently    Birth control/ protection: Surgical     Comment: Hyst--TAH--unsure if has ovaries   Other Topics Concern  . Not on file   Social History Narrative   Lives at [facility] community (on [address] in [place])      [place] for arm fracture      Mental Retardation, but is able to drive to places she is comfortable with and prepare her own meals and manage most of her own  affairs.      [name] (neighbor) is POA/HCPOA      Reports she gets regular exercise and tries to eat healthy     Current Outpatient Prescriptions:  .  acetaminophen (TYLENOL) 500 MG tablet, Take 500 mg by mouth as needed (left ear pain). Reported on 04/18/2015, Disp: , Rfl:  .  diltiazem (CARDIZEM CD) 240 MG 24 hr capsule, TAKE ONE CAPSULE BY MOUTH EVERY DAY, Disp: 90 capsule, Rfl: 1 .  ferrous sulfate 325 (65 FE) MG tablet, TAKE 1 TABLET (325 MG TOTAL) BY MOUTH 2 (TWO) TIMES DAILY WITH A MEAL., Disp: 60 tablet, Rfl: 2 .  hydrochlorothiazide (MICROZIDE) 12.5 MG capsule, TAKE 1 CAPSULE BY MOUTH ONCE DAILY, Disp: 90 capsule, Rfl: 1 .  hydrocortisone 2.5 % ointment, APPLY TO AFFECTED AREA TWICE A DAY, Disp: , Rfl: 0 .  ipratropium (ATROVENT) 0.06 % nasal spray, PLACE 2 SPRAYS INTO BOTH NOSTRILS 4 (FOUR)  TIMES DAILY., Disp: 45 mL, Rfl: 3 .  ketoconazole (NIZORAL) 2 % shampoo, APPLY TO AFFECTED AREA TWICE A WEEK AS DIRECTED, Disp: 120 mL, Rfl: 1 .  Lactobacillus (FLORAJEN ACIDOPHILUS PO), Take 1 tablet by mouth daily., Disp: , Rfl:  .  latanoprost (XALATAN) 0.005 % ophthalmic solution, Place 1 drop into both eyes at bedtime. , Disp: , Rfl:  .  liver oil-zinc oxide (DESITIN) 40 % ointment, Apply 1 application topically as needed for irritation., Disp: , Rfl:  .  loperamide (IMODIUM) 2 MG capsule, Take 2 mg by mouth as needed for diarrhea or loose stools (PRN diarrhea)., Disp: , Rfl:  .  loratadine (CLARITIN) 10 MG tablet, Take 10 mg by mouth daily., Disp: , Rfl:  .  metFORMIN (GLUCOPHAGE) 500 MG tablet, TAKE 1 TABLET BY MOUTH TWICE A DAY WITH A MEAL, Disp: 180 tablet, Rfl: 1 .  omeprazole (PRILOSEC) 20 MG capsule, TAKE 1 CAPSULE (20 MG TOTAL) BY MOUTH 2 (TWO) TIMES DAILY., Disp: 180 capsule, Rfl: 3 .  potassium chloride SA (KLOR-CON M20) 20 MEQ tablet, Take 1 tablet (20 mEq total) by mouth daily., Disp: 90 tablet, Rfl: 3 .  pravastatin (PRAVACHOL) 40 MG tablet, TAKE 1 TABLET BY MOUTH EVERY EVENING, Disp: 90 tablet, Rfl: 1 .  Propylene Glycol (SYSTANE BALANCE) 0.6 % SOLN, , Disp: , Rfl:   EXAM:  There were no vitals filed for this visit.  There is no height or weight on file to calculate BMI.  Wt 192.3 lbs Temp 98 BP 110/80 Pulse 106  GENERAL: vitals reviewed and listed above, alert, oriented, appears well hydrated and in no acute distress  HEENT: atraumatic, conjunttiva clear, no obvious abnormalities on inspection of external nose and ears  NECK: no obvious masses on inspection  LUNGS: clear to auscultation bilaterally, no wheezes, rales or rhonchi, good air movement  CV: HRRR, no peripheral edema  MS: moves all extremities without noticeable abnormality  PSYCH: pleasant and cooperative, no obvious depression or anxiety  ASSESSMENT AND PLAN:  Discussed the following assessment  and plan:  Type 2 diabetes mellitus without complication, without long-term current use of insulin (HCC)  Hyperlipidemia, unspecified hyperlipidemia type  MENTAL RETARDATION, MODERATE  Essential hypertension  Obesity, morbid (HCC)  Sarcoidosis ([place])  Gastric polyps  -HM measures utd -reviewed recent labs from GI -lifestyle recs -follow up 3 months (assistant to call when computers back on) with labs then -Patient advised to return or notify a doctor immediately if symptoms worsen or persist or new concerns arise.  There are no Patient  Instructions on file for this visit.  [name] R., DO

## 2015-11-13 ENCOUNTER — Encounter: Payer: Self-pay | Admitting: Certified Nurse Midwife

## 2015-11-13 ENCOUNTER — Ambulatory Visit (INDEPENDENT_AMBULATORY_CARE_PROVIDER_SITE_OTHER): Payer: Medicare Other | Admitting: Certified Nurse Midwife

## 2015-11-13 ENCOUNTER — Telehealth: Payer: Self-pay | Admitting: *Deleted

## 2015-11-13 ENCOUNTER — Telehealth: Payer: Self-pay

## 2015-11-13 ENCOUNTER — Telehealth: Payer: Self-pay | Admitting: Certified Nurse Midwife

## 2015-11-13 VITALS — BP 112/70 | HR 70 | Resp 16 | Ht 62.0 in | Wt 193.0 lb

## 2015-11-13 DIAGNOSIS — B372 Candidiasis of skin and nail: Secondary | ICD-10-CM | POA: Diagnosis not present

## 2015-11-13 DIAGNOSIS — L290 Pruritus ani: Secondary | ICD-10-CM

## 2015-11-13 NOTE — Telephone Encounter (Signed)
Spoke with patient at time of incoming call. patinet was seen in the office today by [name] for evaluation of rectal itching. Patient is calling to verify how to use medications and how often she needs to change her wash cloth. Advised patient she will need to use Miconazole 2 x a day for 5 days. May use Desitin cream once per day as needed or 7 days. Advised she will need to keep her vaginal and rectal area clean and dry. May use wash cloth more than once if needed, but needs to make sure she is rinsing wash cloth well. If wash cloth is soiled she will need to get a new wash cloth. Advised not to use a wash cloth for more than two days to ensure using clean cloth. Patient verbalizes understanding and is able to repeat instructions back to RN.  Routing to provider for final review. Patient agreeable to disposition. Will close encounter.

## 2015-11-13 NOTE — Telephone Encounter (Signed)
Per Dr Maudie Mercury the pt needs a follow up visit in 3 months.  I called the pt and left a detailed message for her to call back to schedule an appt as we could not do this yesterday when she was here due to the computer system being down.

## 2015-11-13 NOTE — Patient Instructions (Addendum)
Wash daily with wash cloth and soap in vaginal and rectal area and rinse with water..  Even if not on shower day .

## 2015-11-13 NOTE — Telephone Encounter (Signed)
Spoke with patient. Patient states that yesterday she began to have rectal itching after having a bowel movement. Reports applying Monistat 7 to her rectal area with little relief. If having formed bowel movements. Reports keeping her rectum clean and dry. Denies any pain at this time. Reports she is still having rectal itching this morning and is concerned because she is not getting relief with using Monistat cream. States she has been scratching her bottom and it is irritated. Requesting to be seen for evaluation this morning. Appointment scheduled for today 11/13/2015 at 10 am with [name]. Patient is agreeable to date and time.  Routing to provider for final review. Patient agreeable to disposition. Will close encounter.

## 2015-11-13 NOTE — Progress Notes (Signed)
60 y.o. Single Caucasian female [name] here with complaint of rectal itching only. She noted this yesterday after a bowel movement and again today. She cleans with toilet paper and wears depends only when she is out. Patient denies rectal bleeding or blood in stool. She washes area when she takes a shower each week on Saturday, otherwise with paper only. Denies any vaginal problems or urinary problems. " My nerves are bothering me, having eye exam tomorrow and does not like the drops put in". Neighbor is driving her. Ate breakfast before coming in.  Brought her medicine with her. No other problems today.  O:Healthy female WDWN Affect: normal, orientation x 3  Exam: Abdomen:soft, non tender, no bloating noted Lymph node: no enlargement or tenderness Pelvic exam: External genital: normal female, no redness or chafing. Groin creases noted with powder as directed. No change in vulva size on right or tenderness BUS: negative Pelvic exam not done Rectal: redness and small scraps noted around anal opening. No hemmorhoids. Slightly tender. Wet prep of skin taken    Wet Prep results:positive for yeast on saline, Koh    A:Yeast dermatitis around anal opening Vulva area appears normal History of chronic vulvovaginitis and Lichen Sclerosis Mentally challenged since birth Right vulvar mass sarcoidosis site Reads by Letter reading of word   P:Discussed findings of yeast dermatitis and etiology. Discussed washing with soap and water in vaginal and rectal area daily, even if not on shower day. Patient feels she can do this. Discussed this may be increasing her risk of itching. Shown which medication to use in rectal area( has topical Miconazole for use) and instructed 2 x daily x 5 days and wrote instructions also for Desitin (D) med. For her to use after treating to protect area if needed. Questions addressed. Warning signs of bleeding reviewed and need to advise.  Rv prn

## 2015-11-13 NOTE — Telephone Encounter (Signed)
Patient called requesting an appointment today with Melvia Heaps, CNM for "itching down there."

## 2015-11-14 DIAGNOSIS — H401131 Primary open-angle glaucoma, bilateral, mild stage: Secondary | ICD-10-CM | POA: Diagnosis not present

## 2015-11-14 DIAGNOSIS — H33312 Horseshoe tear of retina without detachment, left eye: Secondary | ICD-10-CM | POA: Diagnosis not present

## 2015-11-14 DIAGNOSIS — Z961 Presence of intraocular lens: Secondary | ICD-10-CM | POA: Diagnosis not present

## 2015-11-14 DIAGNOSIS — H04123 Dry eye syndrome of bilateral lacrimal glands: Secondary | ICD-10-CM | POA: Diagnosis not present

## 2015-11-14 NOTE — Progress Notes (Signed)
Reviewed personally.  M. Suzanne Sinai Illingworth, MD.  

## 2015-11-16 NOTE — Progress Notes (Signed)
Encounter reviewed Kaiyan Luczak, MD   

## 2015-11-19 ENCOUNTER — Encounter: Payer: Self-pay | Admitting: Nurse Practitioner

## 2015-11-19 ENCOUNTER — Ambulatory Visit (INDEPENDENT_AMBULATORY_CARE_PROVIDER_SITE_OTHER): Payer: Medicare Other | Admitting: Nurse Practitioner

## 2015-11-19 ENCOUNTER — Other Ambulatory Visit (INDEPENDENT_AMBULATORY_CARE_PROVIDER_SITE_OTHER): Payer: Self-pay | Admitting: Radiology

## 2015-11-19 ENCOUNTER — Telehealth: Payer: Self-pay | Admitting: Certified Nurse Midwife

## 2015-11-19 ENCOUNTER — Telehealth (INDEPENDENT_AMBULATORY_CARE_PROVIDER_SITE_OTHER): Payer: Self-pay | Admitting: Radiology

## 2015-11-19 VITALS — BP 134/80 | HR 60 | Ht 62.0 in | Wt 194.0 lb

## 2015-11-19 DIAGNOSIS — L290 Pruritus ani: Secondary | ICD-10-CM

## 2015-11-19 NOTE — Patient Instructions (Signed)
Keep bottom area clean and dry.  Use medication as instructed BID.

## 2015-11-19 NOTE — Telephone Encounter (Signed)
Spoke with patient. Patient states was in to see Melvia Heaps, CNM 11/13/15. Patient states she has yeast on her bottom. Patient states she has been washing daily with soap and water as directed and using Miconazole as directed. Patient states the burning has gotten worse and something has to be done. Patient request to see Kem Boroughs, NP today. Patient reports bowels movements normal, no gyn or urinary complaints. Patient scheduled for 11/19/15 at 1:00pm with Kem Boroughs, NP. Patient agreeable to date and time.   Cc:  Melvia Heaps, CNM   Routing to provider for final review. Patient is agreeable to disposition. Will close encounter.

## 2015-11-19 NOTE — Progress Notes (Signed)
60 y.o. Divorced Caucasian female [name] here for follow up of rectal itching.  Last week was seen for yeast at the rectal area and using Desitin BID and Miconazole twice a day.  She has felt better with that problem.  But as she was scratching her bottom last night, she decided to go and wash and noted some rectal burning.  Worried that her symptoms were worse. Denies urinary symptoms.  Left eye exam from last week showed no increased problems but does need a follow up secondary to DM.  O: Healthy WD,WN female Affect:  No distress Abdomen:soft, non tender, normal bowel sounds Pelvic exam:EXTERNAL GENITALIA: normal appearing vulva with no masses, tenderness or lesions, looks better than usual RECTUM: no masses or abnormalities, there is about 95 % clearing of anal, perianal yeast.  No lesions, hemorrhoids or fissures.  A: Rectal, perirectal yeast almost cleared    P:  Discussed that if she feels irritated to use cool compresses and try not to scratch as this leaves a sore place that can burn with cleaning with soap   Labs:  None    Instructions given regarding: continue cream as directed.  RV

## 2015-11-19 NOTE — Telephone Encounter (Signed)
Patient called and said, "I'd like an appointment with Patty today. I've got some burning back there. I know Debbi doesn't work on Mondays so I'd like to see Patty."

## 2015-11-19 NOTE — Telephone Encounter (Signed)
Received message in [facility] requesting refill of Tramadol 50mg .  Last prescribed Tramadol 50mg  1 po QD #30 with no refills on 09/13/2015.

## 2015-11-20 ENCOUNTER — Telehealth: Payer: Self-pay | Admitting: Certified Nurse Midwife

## 2015-11-20 MED ORDER — TRAMADOL HCL 50 MG PO TABS: 50.0000 mg | ORAL_TABLET | Freq: Every day | ORAL | 0 refills | Status: DC

## 2015-11-20 NOTE — Telephone Encounter (Signed)
Spoke with patient. Patient states she has "slight itch one time at [facility], is it ok to use a little bit of coconut oil?"  Denies any rectal pain or itching at this time. Advised patient ok to use small amounts of coconut oil for itching. Patient verbalizes understanding and is agreeable.    Routing to provider for final review. Patient is agreeable to disposition. Will close encounter.

## 2015-11-20 NOTE — Telephone Encounter (Signed)
Just fine to use as needed.

## 2015-11-20 NOTE — Telephone Encounter (Signed)
Linda Thompson, CNM -ok for patient to go back to  using coconut oil for vaginal itching?

## 2015-11-20 NOTE — Telephone Encounter (Signed)
Rx called to pharm.  Pt had also called this morning to triage, she wants an appt to be seen for LBP, IC and LM for her advised Rx called to pharm, and no soon appts on sched, for her to call and make appt with Yates at some point for eval.

## 2015-11-20 NOTE — Telephone Encounter (Signed)
Patient is calling to find out if she can use the coconut oil in her vagina for itching.

## 2015-11-20 NOTE — Progress Notes (Signed)
Encounter reviewed by Dr. Brook Amundson C. Silva.  

## 2015-11-20 NOTE — Telephone Encounter (Signed)
Sending message to you

## 2015-11-20 NOTE — Telephone Encounter (Signed)
Ok thanks 

## 2015-11-22 ENCOUNTER — Telehealth: Payer: Self-pay | Admitting: *Deleted

## 2015-11-22 NOTE — Telephone Encounter (Signed)
She should apply the miconazole twice daily to rectal area and can apply Destin after application of Miconazole. Destin only once daily. I do not think she needs an appointment. She is following correct directions.

## 2015-11-22 NOTE — Telephone Encounter (Signed)
Spoke with patient. Reviewed recommendations as seen below. Reviewed peri care as previously recommended and application of Desitin and miconazole. Patient repeated instructions back to RN.   Routing to provider for final review. Patient is agreeable to disposition. Will close encounter.

## 2015-11-22 NOTE — Telephone Encounter (Signed)
Patient returning call.

## 2015-11-22 NOTE — Telephone Encounter (Signed)
Left message to call Takesha Steger at [phone].  

## 2015-11-22 NOTE — Telephone Encounter (Signed)
Spoke with patient. Patient states rectal itching started again last night 11/1. Patient states she used desitin and miconazole cream starting last night with itching still this morning. Patient states she is using small amounts of coconut oil for vagina when itchy. No complaints of vaginal itching, urinary complaints or pain. States LBM 11/22/15. Reviewed peri care. Patient asking if she needs to come in for OV. Advised patient would review with Melvia Heaps, CNM for recommendations and return call. Patient verbalizes understanding.   Melvia Heaps, CNM please advise?

## 2015-11-23 ENCOUNTER — Telehealth: Payer: Self-pay | Admitting: Certified Nurse Midwife

## 2015-11-23 NOTE — Telephone Encounter (Signed)
Spoke with patient. Patient calling to review application of desitin and miconazole cream to rectum. Patient states one with "D"  once a day and one with "M" twice a day. Confirmed and reviewed with patient. Patient repeated instructions back to RN.   Routing to provider for final review. Patient is agreeable to disposition. Will close encounter.

## 2015-11-23 NOTE — Telephone Encounter (Signed)
Left message to call [name] at [phone].  

## 2015-11-23 NOTE — Telephone Encounter (Signed)
Patient called and requested to speak with the nurse. She said, "I have some bumps on my anus I got some cream for but I've got some questions about how often to put the cream back there."

## 2015-11-25 DIAGNOSIS — K649 Unspecified hemorrhoids: Secondary | ICD-10-CM | POA: Diagnosis not present

## 2015-11-26 ENCOUNTER — Ambulatory Visit (INDEPENDENT_AMBULATORY_CARE_PROVIDER_SITE_OTHER): Payer: Medicare Other | Admitting: Obstetrics & Gynecology

## 2015-11-26 ENCOUNTER — Encounter: Payer: Self-pay | Admitting: Obstetrics & Gynecology

## 2015-11-26 ENCOUNTER — Telehealth: Payer: Self-pay | Admitting: Certified Nurse Midwife

## 2015-11-26 ENCOUNTER — Telehealth: Payer: Self-pay | Admitting: Obstetrics & Gynecology

## 2015-11-26 VITALS — BP 130/82 | HR 70 | Resp 14 | Ht 62.0 in | Wt 192.0 lb

## 2015-11-26 DIAGNOSIS — N9089 Other specified noninflammatory disorders of vulva and perineum: Secondary | ICD-10-CM

## 2015-11-26 DIAGNOSIS — N909 Noninflammatory disorder of vulva and perineum, unspecified: Secondary | ICD-10-CM

## 2015-11-26 DIAGNOSIS — L290 Pruritus ani: Secondary | ICD-10-CM | POA: Diagnosis not present

## 2015-11-26 NOTE — Telephone Encounter (Signed)
Patient called with questions about her new medication for hemorrhoids. Linda Thompson said, "Why does Dr. Sabra Heck want me to stop using the coconut oil with my new medicine? Debbi said I need to use it."

## 2015-11-26 NOTE — Telephone Encounter (Signed)
Spoke with patient. Patient states that she was seen at Urgent Care over the weekend due to increased rectal itching and irritation. Reports she was advised that she has hemorrhoids. Patient was prescribed rectal suppository and external cream. Patient placed 2 suppositories yesterday and 1 this morning. Applied external cream this morning and is having increased rectal burning. Patient is very concerned. States she was advised by urgent care to follow up with GYN or PCP. Requesting to be seen in our office for further evaluation with MD today. "I don't want to get worse. The pharmacist told me I have two things going on. A yeast infection and hemorrhoids." Patient voices concern regarding diagnosis. Appointment scheduled for today 11/26/2015 at 10 am with Dr.Miller. Patient is agreeable to date and time. Aware she will need to bring both prescriptions prescribed by Urgent Care with her to her appointment this morning. Patient is agreeable.  Routing to [place] for final review. Patient agreeable to disposition. Will close encounter.

## 2015-11-26 NOTE — Progress Notes (Signed)
GYNECOLOGY  VISIT   HPI: 60 y.o. [name] Single Caucasian female with complaint of perirectal itching.  Reports she went to urgent care yesterday due to the itching and because someone suggested she has hemorrhoids.  Was prescribed hydrocortisone suppositories twice daily for 6 days.    Denies vaginal bleeding.  GYNECOLOGIC HISTORY: Patient's last menstrual period was 01/21/1992 (approximate). Contraception: not SA  Patient Active Problem List   Diagnosis Date Noted  . Sarcoidosis ([place]) 10/27/2014  . Obesity, morbid ([place]) 05/05/2011  . Irritable bowel syndrome 03/05/2010  . Diabetes ([place]) 08/13/2007  . ALLERGIC  RHINITIS 09/23/2006  . Hyperlipemia 03/11/2006  . MENTAL RETARDATION, MODERATE 03/11/2006  . GLAUCOMA NOS 03/11/2006  . Essential hypertension 03/11/2006  . GERD 03/11/2006  . MENINGIOMA 12/20/2001    Past Medical History:  Diagnosis Date  . Allergic rhinitis   . Allergy   . Anemia   . Chronic low back pain   . DDD (degenerative disc disease), lumbar   . GERD (gastroesophageal reflux disease)    w/ hx diaphragmatic hernia  . Glaucoma   . GLAUCOMA NOS 03/11/2006   Qualifier: Diagnosis of  By: Diona Browner MD, Amy    . Heart murmur   . Hx of fracture    R arm, foot  . Hyperlipemia   . Hypertension   . IBS (irritable bowel syndrome)   . Low back pain 11/22/2012  . Meningioma ([place])    ant falx, stable on prior imaging  . Moderate intellectual disabilities   . Murmur   . Obesity   . Osteoarthrosis, unspecified whether generalized or localized, ankle and foot   . Sarcoidosis ([place])    difuse bony lesions and LDA, dx by biopsy in 2016  . Transaminitis   . Type II or unspecified type diabetes mellitus without mention of complication, not stated as uncontrolled     Past Surgical History:  Procedure Laterality Date  . CATARACT EXTRACTION    . COLONOSCOPY  04/15/2011  . ELBOW SURGERY     right elbow  . LIPOMA EXCISION Left [id number]   posterior left axilla  . TOTAL  ABDOMINAL HYSTERECTOMY  03/05/1992   leiomyoma and cellular leiomyoma    MEDS:  Reviewed in EPIC and UTD  ALLERGIES: Ace inhibitors; Amoxicillin-pot clavulanate; Augmentin [amoxicillin-pot clavulanate]; Azithromycin; Ceftin [cefuroxime axetil]; Cefuroxime; Cyclobenzaprine hcl; Flexeril [cyclobenzaprine]; Flonase [fluticasone propionate]; Fluticasone; and Ibuprofen  Family History  Problem Relation Age of Onset  . Diabetes Mother   . Stroke Mother   . Hypertension Mother   . Stroke Father   . Diabetes Maternal Aunt   . Heart disease Maternal Aunt      (had pacemaker)  . Diabetes Maternal Grandmother   . Stroke Maternal Grandmother   . Hypertension Maternal Grandmother   . Colon cancer Neg Hx     SH:  Non smoker, single  Review of Systems  All other systems reviewed and are negative.   PHYSICAL EXAMINATION:    BP 130/82 (BP Location: Left Arm, Patient Position: Sitting, Cuff Size: Large)   Pulse 70   Resp 14   Ht 5\' 2"  (1.575 m)   Wt 192 lb (87.1 kg)   LMP 01/21/1992 (Approximate)   BMI 35.12 kg/m     General appearance: alert, cooperative and appears stated age Abdomen: soft, non-tender; bowel sounds normal; no masses,  no organomegaly  Pelvic: External genitalia:  no lesions except for right labial mass about 3-4 cm in size  Urethra:  normal appearing urethra with no masses, tenderness or lesions              Bartholins and Skenes: normal                 Vagina: normal appearing vagina with normal color and discharge, no lesions, atrophic appearance              Cervix: absent              Bimanual Exam:  Uterus:  uterus absent              Rectovaginal: Yes.  .  Confirms.              Anus:  normal sphincter tone, internal hemorrhoid noted at 12 o'clock,  no lesions  Chaperone was present for exam.  Assessment: Hemorrhoids, internal Vulvar lipoma on right.  Had pelvic MRI and PUS done Perirectal irritation  Plan: Pt advised to stop miconazol and  use desitin only as needed. She will continue the hydrocortisone suppositories bid for the next 5 days.  Advised can stop if itching fully resolves.  Has a RF and knows not to get it filled right now.

## 2015-11-26 NOTE — Telephone Encounter (Signed)
Patient says she went to the urgent care yesterday and was diagnosed with hemorrhoids. She is taking medication that was given to her but want to be seen today.

## 2015-11-26 NOTE — Patient Instructions (Signed)
Stop the Miconzdole.  It's the one in the small purple tube.    Ok to use the Desitin nightly.  This is in the large purple tube.

## 2015-11-26 NOTE — Telephone Encounter (Signed)
Spoke with patient. Patient is asking "Why can I not use the coconut oil with the other medication?" Advised she will need to complete her full course of hydrocortisone rectal suppositories before restarting coconut oil. Place 1 suppository rectally twice daily for 6 days. Advised may use desitin only as needed. Needs to restart use of coconut oil on 12/01/2015 after completion of hydrocortisone suppositories. Patient is able repeat instructions to RN x2.   Routing to provider for final review. Patient agreeable to disposition. Will close encounter.

## 2015-11-27 ENCOUNTER — Encounter: Payer: Self-pay | Admitting: Family Medicine

## 2015-11-27 ENCOUNTER — Ambulatory Visit (INDEPENDENT_AMBULATORY_CARE_PROVIDER_SITE_OTHER): Payer: Medicare Other | Admitting: Family Medicine

## 2015-11-27 ENCOUNTER — Telehealth: Payer: Self-pay | Admitting: Family Medicine

## 2015-11-27 ENCOUNTER — Telehealth: Payer: Self-pay | Admitting: Certified Nurse Midwife

## 2015-11-27 VITALS — BP 128/80 | HR 97 | Temp 97.6°F | Ht 62.0 in | Wt 191.1 lb

## 2015-11-27 DIAGNOSIS — K649 Unspecified hemorrhoids: Secondary | ICD-10-CM | POA: Diagnosis not present

## 2015-11-27 DIAGNOSIS — F79 Unspecified intellectual disabilities: Secondary | ICD-10-CM

## 2015-11-27 DIAGNOSIS — E119 Type 2 diabetes mellitus without complications: Secondary | ICD-10-CM

## 2015-11-27 NOTE — Telephone Encounter (Signed)
agree

## 2015-11-27 NOTE — Progress Notes (Signed)
Pre visit review using our clinic review tool, if applicable. No additional management support is needed unless otherwise documented below in the visit note. 

## 2015-11-27 NOTE — Telephone Encounter (Signed)
Patient wants to talk with the nurse. She wants to discuss using baby wipes.

## 2015-11-27 NOTE — Patient Instructions (Addendum)
Use the suppositories twice daily for the next 4 days - may stop if symptoms resolve.  Can use unscented wipes for the back area (poop area) only if you wish.  Can use the refill if needed only if symptoms return.  Return to clinic if severe pain, bleeding or if itching persists in 1 month

## 2015-11-27 NOTE — Addendum Note (Signed)
Addended by: Precious Gilding on: 11/27/2015 10:37 AM   Modules accepted: Orders

## 2015-11-27 NOTE — Progress Notes (Signed)
HPI:  Acute visit for Hemorrhoids: -saw [facility] 2 days ago and Dr. Sabra Heck for this yesterday - notes reviewed and Dr. Sabra Heck did thorough exam with small intern hemorrhoid per notes -pt has cognitive challenges and was unsure of instructions so came here today to clarify after speaking with EMS and nurse -she has been using the hydrocortisone sup prescribed in urgent care and reports improvement in symptoms with only mild pruritis remaining without any bleeding or pain -she wonders if she can use wipes in this area and how long to use the sup and if can do sitz baths    ROS: See pertinent positives and negatives per HPI.  Past Medical History:  Diagnosis Date  . Allergic rhinitis   . Allergy   . Anemia   . Chronic low back pain   . DDD (degenerative disc disease), lumbar   . GERD (gastroesophageal reflux disease)    w/ hx diaphragmatic hernia  . Glaucoma   . GLAUCOMA NOS 03/11/2006   Qualifier: Diagnosis of  By: Diona Browner MD, Amy    . Heart murmur   . Hx of fracture    R arm, foot  . Hyperlipemia   . Hypertension   . IBS (irritable bowel syndrome)   . Low back pain 11/22/2012  . Meningioma ([place])    ant falx, stable on prior imaging  . Moderate intellectual disabilities   . Murmur   . Obesity   . Osteoarthrosis, unspecified whether generalized or localized, ankle and foot   . Sarcoidosis ([place])    difuse bony lesions and LDA, dx by biopsy in 2016  . Transaminitis   . Type II or unspecified type diabetes mellitus without mention of complication, not stated as uncontrolled     Past Surgical History:  Procedure Laterality Date  . CATARACT EXTRACTION    . COLONOSCOPY  04/15/2011  . ELBOW SURGERY     right elbow  . LIPOMA EXCISION Left [id number]   posterior left axilla  . TOTAL ABDOMINAL HYSTERECTOMY  03/05/1992   leiomyoma and cellular leiomyoma    Family History  Problem Relation Age of Onset  . Diabetes Mother   . Stroke Mother   . Hypertension Mother   . Stroke Father    . Diabetes Maternal Aunt   . Heart disease Maternal Aunt      (had pacemaker)  . Diabetes Maternal Grandmother   . Stroke Maternal Grandmother   . Hypertension Maternal Grandmother   . Colon cancer Neg Hx     Social History   Social History  . Marital status: Single    Spouse name: N/A  . Number of children: 0  . Years of education: N/A   Occupational History  . Disabled (from arm fracture)    Social History Main Topics  . Smoking status: Never Smoker  . Smokeless tobacco: Never Used  . Alcohol use No  . Drug use: No  . Sexual activity: Not Currently    Birth control/ protection: Surgical     Comment: Hyst--TAH--unsure if has ovaries   Other Topics Concern  . None   Social History Narrative   Lives at [facility] (on [address] in [place])      [place] for [place] fracture      Mental Retardation, but is able to drive to places she is comfortable with and prepare her own meals and manage most of her own affairs.      [name] (neighbor) is POA/HCPOA  Reports she gets regular exercise and tries to eat healthy     Current Outpatient Prescriptions:  .  acetaminophen (TYLENOL) 500 MG tablet, Take 500 mg by mouth as needed (left ear pain). Reported on 04/18/2015, Disp: , Rfl:  .  diltiazem (CARDIZEM CD) 240 MG 24 hr capsule, TAKE ONE CAPSULE BY MOUTH EVERY DAY, Disp: 90 capsule, Rfl: 1 .  ferrous sulfate 325 (65 FE) MG tablet, TAKE 1 TABLET (325 MG TOTAL) BY MOUTH 2 (TWO) TIMES DAILY WITH A MEAL., Disp: 60 tablet, Rfl: 2 .  hydrochlorothiazide (MICROZIDE) 12.5 MG capsule, TAKE 1 CAPSULE BY MOUTH ONCE DAILY, Disp: 90 capsule, Rfl: 1 .  hydrocortisone 2.5 % ointment, APPLY TO AFFECTED AREA TWICE A DAY, Disp: , Rfl: 0 .  ipratropium (ATROVENT) 0.06 % nasal spray, PLACE 2 SPRAYS INTO BOTH NOSTRILS 4 (FOUR) TIMES DAILY., Disp: 45 mL, Rfl: 3 .  ketoconazole (NIZORAL) 2 % shampoo, APPLY TO AFFECTED AREA TWICE A WEEK AS DIRECTED,  Disp: 120 mL, Rfl: 1 .  Lactobacillus (FLORAJEN ACIDOPHILUS PO), Take 1 tablet by mouth daily., Disp: , Rfl:  .  latanoprost (XALATAN) 0.005 % ophthalmic solution, Place 1 drop into both eyes at bedtime. , Disp: , Rfl:  .  liver oil-zinc oxide (DESITIN) 40 % ointment, Apply 1 application topically as needed for irritation., Disp: , Rfl:  .  loperamide (IMODIUM) 2 MG capsule, Take 2 mg by mouth as needed for diarrhea or loose stools (PRN diarrhea)., Disp: , Rfl:  .  loratadine (CLARITIN) 10 MG tablet, Take 10 mg by mouth daily., Disp: , Rfl:  .  metFORMIN (GLUCOPHAGE) 500 MG tablet, TAKE 1 TABLET BY MOUTH TWICE A DAY WITH A MEAL, Disp: 180 tablet, Rfl: 1 .  omeprazole (PRILOSEC) 20 MG capsule, TAKE 1 CAPSULE (20 MG TOTAL) BY MOUTH 2 (TWO) TIMES DAILY., Disp: 180 capsule, Rfl: 3 .  potassium chloride SA (KLOR-CON M20) 20 MEQ tablet, Take 1 tablet (20 mEq total) by mouth daily., Disp: 90 tablet, Rfl: 3 .  pravastatin (PRAVACHOL) 40 MG tablet, TAKE 1 TABLET BY MOUTH EVERY EVENING, Disp: 90 tablet, Rfl: 1 .  Propylene Glycol (SYSTANE BALANCE) 0.6 % SOLN, , Disp: , Rfl:  .  traMADol (ULTRAM) 50 MG tablet, Take 1 tablet (50 mg total) by mouth daily., Disp: 30 tablet, Rfl: 0  EXAM:  Vitals:   11/27/15 0911  BP: 128/80  Pulse: 97  Temp: 97.6 F (36.4 C)    Body mass index is 34.95 kg/m.  GENERAL: vitals reviewed and listed above, alert, oriented, appears well hydrated and in no acute distress  HEENT: atraumatic, conjunttiva clear, no obvious abnormalities on inspection of external nose and ears  NECK: no obvious masses on inspection  LUNGS: clear to auscultation bilaterally, no wheezes, rales or rhonchi, good air movement  CV: HRRR, no peripheral edema  RECTAL: deferred - done yesterday and symptoms improved, notes from gyn reviewed  MS: moves all extremities without noticeable abnormality  PSYCH: pleasant and cooperative, no obvious depression or anxiety  ASSESSMENT AND  PLAN:  Discussed the following assessment and plan:  Hemorrhoids, unspecified hemorrhoid type  -reviewed notes from other providers and reviewed and provided pt with plan -advised can take several weeks for all symptoms to resolve and advised follow up may only be needed if significant pain, bleeding or if pruritis persists in 4 weeks -advised she can always speak to our nurse here in the interim if she has any questions -Patient advised to return or notify  a doctor immediately if symptoms worsen or persist or new concerns arise.  Patient Instructions  Use the suppositories twice daily for the next 4 days - may stop if symptoms resolve.  Can use unscented wipes for the back area (poop area) only if you wish.  Can use the refill if needed only if symptoms return.  Return to clinic if severe pain, bleeding or if itching persists in 1 month     KIM, Jarrett Soho R., DO

## 2015-11-27 NOTE — Telephone Encounter (Signed)
Left message to call Kaitlyn at [phone]. 

## 2015-11-27 NOTE — Telephone Encounter (Signed)
Linda Thompson, Could you see if [facility] may be able to assist with this pt? She requests and schedules very frequent office visits, often for minor health concerns , clarification of instructions or minor medication issues that possibly  someone could assist with via phone? I am not sure if this is something they could help with?  Thank you.

## 2015-11-27 NOTE — Telephone Encounter (Signed)
[facility] referral has been placed

## 2015-11-27 NOTE — Telephone Encounter (Signed)
Spoke with patient. Patient states she was seen this morning for evaluation with her PCP Dr.Kim. Reports she was advised she may use unscented baby wipes after having a bowel movement. Asking if this is recommended by our office. Advised she may use unscented baby wipes as needed after a bowel movement as recommended by her PCP. Asking who to contact if her symptoms worsen or if she develops bleeding. Reports she discussed this with Dr.Kim. Advised per OV with Dr.Kim patient is to notify a doctor immediately if symptoms persist or worsen. May return to see Dr.Kim if she has severe pain, bleeding, or if itching persists in 1 month. Patient verbalizes understanding.  Routing to provider for final review. Patient agreeable to disposition. Will close encounter.

## 2015-11-28 ENCOUNTER — Ambulatory Visit (INDEPENDENT_AMBULATORY_CARE_PROVIDER_SITE_OTHER): Payer: Medicare Other | Admitting: Podiatry

## 2015-11-28 ENCOUNTER — Encounter: Payer: Self-pay | Admitting: Podiatry

## 2015-11-28 VITALS — Ht 60.0 in | Wt 191.0 lb

## 2015-11-28 DIAGNOSIS — E1140 Type 2 diabetes mellitus with diabetic neuropathy, unspecified: Secondary | ICD-10-CM | POA: Diagnosis not present

## 2015-11-28 DIAGNOSIS — B351 Tinea unguium: Secondary | ICD-10-CM

## 2015-11-28 NOTE — Progress Notes (Signed)
Patient ID: Linda Thompson, female   DOB: 02/28/1955, 60 y.o.   MRN: [id number] Complaint:  Visit Type: Patient returns to my office for continued preventative foot care services. Complaint: Patient states" my nails have grown long and thick and become painful to walk and wear shoes" Patient has been diagnosed with DM with no foot complications. The patient presents for preventative foot care services. No changes to ROS  Podiatric Exam: Vascular: dorsalis pedis and posterior tibial pulses are palpable bilateral. Capillary return is immediate. Temperature gradient is WNL. Skin turgor WNL  Sensorium: Normal Semmes Weinstein monofilament test. Normal tactile sensation bilaterally. Nail Exam: Pt has thick disfigured discolored nails with subungual debris noted bilateral entire nail hallux through fifth toenails Ulcer Exam: There is no evidence of ulcer or pre-ulcerative changes or infection. Orthopedic Exam: Muscle tone and strength are WNL. No limitations in general ROM. No crepitus or effusions noted. Foot type and digits show no abnormalities. Bony prominences are unremarkable. Skin: No Porokeratosis. No infection or ulcers  Diagnosis:  Onychomycosis, , Pain in right toe, pain in left toes  Treatment & Plan Procedures and Treatment: Consent by patient was obtained for treatment procedures. The patient understood the discussion of treatment and procedures well. All questions were answered thoroughly reviewed. Debridement of mycotic and hypertrophic toenails, 1 through 5 bilateral and clearing of subungual debris. No ulceration, no infection noted.  Return Visit-Office Procedure: Patient instructed to return to the office for a follow up visit 3 months for continued evaluation and treatment.   Lenni Reckner DPM 

## 2015-11-29 ENCOUNTER — Telehealth: Payer: Self-pay | Admitting: Certified Nurse Midwife

## 2015-11-29 ENCOUNTER — Other Ambulatory Visit: Payer: Self-pay

## 2015-11-29 NOTE — Telephone Encounter (Signed)
Spoke with patient. Patient states that she has 3 suppositories left in her package. States she is still having mild rectal itching and would like to know if the suppositories will help with these symptoms. Advised Hydrocortisone suppositories will help to relieve the symptoms she is experiencing. Advised she will need to complete full course of the medication. Patient is concerned about symptoms persisting after completion of medication. Advised if symptoms persist or worsen she will need to contact her PCP for further evaluation and recommendations. Patient is agreeable. Patient is able to repeat medication instructions to RN x3. 28 minute phone call with patient.  Cc: [name]   Routing to provider for final review. Patient agreeable to disposition. Will close encounter.

## 2015-11-29 NOTE — Patient Outreach (Signed)
[facility] Care Management  11/29/2015  Linda Thompson 13-May-1955 [id number]   Telephonic Screening and Initial Assessment   Referral Date:  11/27/2015 Source:  Linda Thompson Primary Care Issue:  Diabetes Type 2 without complication, without long-term current use of insulin.  Mental retardation.  Clarification of medications and how to properly manage health at home.  Patient scheduling appointment for minor concerns.  THN to act as liaison between patient at home and scheduling appointments.   Insurance:  Medicare   Subjective:   Physical Address:  [address] [place], [place] [address] Patient states she prefers to see her MD because she understands better when having a face to face visit.   Providers: Primary MD: Dr. [name], DO  -  last appt: 10/27/15:  Podiatrist:  Dr. Gardiner Thompson - last appt 11/28/15 - next appt 3 months Gynecologist: Dr. Hale Thompson and Linda Thompson, CNM - last appt -  11/26/15  Psycho/Social: Patient lives in [place] apartment alone.  Patient has metal retardation, moderate.  Parents are deceased.  Mobility:  Ambulates with no assistive devices Falls: 0 Pain: arthritis Left hip; no issues presently Depression: none Transportation:  Patient still driving  Caregiver:  Parents: deceased.  Ms. Linda Thompson [id number] POA:  Linda Thompson Ph: [phone] / Fax: [phone] takes care of paying all of patients bills.  Linda Thompson [address] [place], [place] [address]  Patient states she receives regular calls from her [place] office to check on her.  Advance Directive: states Ms Linda Thompson would make decisions.  Consent:  yes DME: eyeglasses, Partial lower dental plate.   Co-morbidities:  Diabetes, Essential hypertension, GERD, IBS, Meningioma, Mental Retardation, Moderate; Glaucoma, Obesity, morbid; Sarcoidosis Admissions: 0 ER visits: 0  Hemorrhoid, internal with perirectal  irritation Patient states this is the first time she has ever had hemorrhoid issues.  States she thought she had a yeast infection and had been treating with over the counter products.  States her symptoms continued to get worse and she followed up at a local [facility] on 11/25/15.   Itching continues and is very bothersome to patient.  States she may go back to see Primary next week for a follow-up is symptoms have not improved.  Patient understands that she has a refill on medication but should follow-up with MD if symptoms do not improve.  Urgent Care prescribed patient prescribed hydrocortisone suppositories 25 mg; 1 suppository rectally twice daily for 6 days.  Patient states she will complete treatment on Friday 11/29/15. Patient advised on 11/26/15 GYN visit  to stop miconazole and use Desitin only as needed and may use unscented wipes for cleaning as needed.  Patient verbalized understanding she may not resume the Coconut oil until she  finishes with suppository regime. Patient denies constipation and states she is having regular bowel movements.  States she showers 1-2 times a week and is not sexually active.    BP 128/80 11/27/2015 Weight 191 lb (87 kg) 11/27/2015 Height 62 in (157 cm) 11/27/2015 BMI 35.00 (Obese Class II) 11/27/2015  Lipid Panel completed 12/12/2013 HDL 28.200 12/12/2013 LDL 78.800 12/12/2013 Cholesterol, total 141.000 12/12/2013 Triglycerides 266.000 12/12/2013 A1C 6.700 08/14/2015 Glucose Random 119.000 02/01/2015  Medications:  Patient taking more less than 15 medications  Co-pay cost issues: none  Prevnar (PCV13) N/D Pneumovax (PP 02/08/2009 Flu Vaccine 08/31/2015 tDAP Vaccine 08/08/2014 Medication Reconciliation completed with patient 11/29/15  [facility]arm[facility] and picks medications up herself  Encounter Medications:  Outpatient Encounter Prescriptions as of 11/29/2015  Medication Sig  . acetaminophen (TYLENOL) 500 MG tablet Take 500 mg by mouth as needed  (left ear pain). Reported on 04/18/2015  . diltiazem (CARDIZEM CD) 240 MG 24 hr capsule TAKE ONE CAPSULE BY MOUTH EVERY DAY  . ferrous sulfate 325 (65 FE) MG tablet TAKE 1 TABLET (325 MG TOTAL) BY MOUTH 2 (TWO) TIMES DAILY WITH A MEAL.  . hydrocortisone 2.5 % ointment APPLY TO AFFECTED AREA TWICE A DAY  . ipratropium (ATROVENT) 0.06 % nasal spray PLACE 2 SPRAYS INTO BOTH NOSTRILS 4 (FOUR) TIMES DAILY.  Marland Kitchen ketoconazole (NIZORAL) 2 % shampoo APPLY TO AFFECTED AREA TWICE A WEEK AS DIRECTED  . Lactobacillus (FLORAJEN ACIDOPHILUS PO) Take 1 tablet by mouth daily.  Marland Kitchen latanoprost (XALATAN) 0.005 % ophthalmic solution Place 1 drop into both eyes at bedtime.   Marland Kitchen liver oil-zinc oxide (DESITIN) 40 % ointment Apply 1 application topically as needed for irritation.  Marland Kitchen loperamide (IMODIUM) 2 MG capsule Take 2 mg by mouth as needed for diarrhea or loose stools (PRN diarrhea).  . loratadine (CLARITIN) 10 MG tablet Take 10 mg by mouth daily.  . metFORMIN (GLUCOPHAGE) 500 MG tablet TAKE 1 TABLET BY MOUTH TWICE A DAY WITH A MEAL  . omeprazole (PRILOSEC) 20 MG capsule TAKE 1 CAPSULE (20 MG TOTAL) BY MOUTH 2 (TWO) TIMES DAILY.  Marland Kitchen potassium chloride SA (KLOR-CON M20) 20 MEQ tablet Take 1 tablet (20 mEq total) by mouth daily.  . pravastatin (PRAVACHOL) 40 MG tablet TAKE 1 TABLET BY MOUTH EVERY EVENING  . Propylene Glycol (SYSTANE BALANCE) 0.6 % SOLN   . traMADol (ULTRAM) 50 MG tablet Take 1 tablet (50 mg total) by mouth daily.  . hydrochlorothiazide (MICROZIDE) 12.5 MG capsule TAKE 1 CAPSULE BY MOUTH ONCE DAILY (Patient not taking: Reported on 11/29/2015)   No facility-administered encounter medications on file as of 11/29/2015.     Functional Status:  In your present state of health, do you have any difficulty performing the following activities: 11/29/2015  Hearing? N  Vision? N  Difficulty concentrating or making decisions? N  Walking or climbing stairs? N  Dressing or bathing? N  Doing errands, shopping? N   Preparing Food and eating ? N  Using the Toilet? N  In the past six months, have you accidently leaked urine? N  Do you have problems with loss of bowel control? N  Managing your Medications? N  Managing your Finances? N  Housekeeping or managing your Housekeeping? N  Some recent data might be hidden    Fall/Depression Screening: PHQ 2/9 Scores 11/29/2015  PHQ - 2 Score 0    Fall Risk  11/29/2015  Falls in the past year? No    Preventives: Hearing: no issues  Eyes: Dr. 10/2015  Dentist: no issues and has not seen in a long time.  Bone Density: unknown Colonoscopy 04/18/2015 Mammogram 09/19/2015 Endo - DM Eye Exam: positive retin 05/29/2015 Endo - DM Foot Exam 02/01/2015  Plan:  Referral Date:  11/27/2015 Screening and Initial Assessment 11/29/2015 Telephonic RN CM 11/29/2015 Program:  DM 11/29/2015  THN Telephonic RN CM Referral  -Disease Management and education -Medication management and education -High utilization of provider services.   Hemorrhoid, internal with perirectal irritation -RN CM encouraged patient to shower daily during this time.  Encouraged to stay clean and dry.  Patient C/O having to do her own laundry and this was a problem to use so many towels and cloths.  States she has to do her own landry at the facility.  States she is using a hair dryer to dry her bottom following her shower and cleanings. . -RN CM advised to use medications as directed.  Patient able to state treatment plan with clear understanding.     Diabetes RN CM will provide ongoing education and management and assess for resolution to possible complications of diabetes such as yeast.   RN CM advised in next [facility] scheduled contact call within next 30 days for monthly assessment and care coordination services as needed. RN CM advised to please notify MD of any changes in condition prior to scheduled appt's.   RN CM provided contact name and # [phone] or main office # [phone] and  24-hour nurse line # [phone].  RN CM confirmed patient is aware of 911 services for urgent emergency needs.  RN CM sent successful outreach letter and   [facility] Introductory package. RN CM sent Physician Enrollment/Barriers Letter and Initial Assessment to Primary MD RN CM notified [place] Management Assistant: agreed to services/case opened.  [facility] CM Care Plan Problem One   Flowsheet Row Most Recent Value  Care Plan Problem One  Knowledge deficit associate to Diabetes complications.    Role Documenting the Problem One  Care Management Telephonic [place] for Problem One  Active  THN Long Term Goal (31-90 days)  Patient will engage with [facility] RN CM to improve DM self management over the next 31-90 days.   THN Long Term Goal Start Date  11/29/15  Interventions for Problem One Long Term Goal  RN CM will assess and monitor symptoms and provide education as needed over the next 31-90 days.   THN CM Short Term Goal #1 (0-30 days)  Patient will adhere to medication regime over the next 30 days.   THN CM Short Term Goal #1 Start Date  11/29/15  Interventions for Short Term Goal #1  RN CM will provide education and medication reconciliation over the next 30 days.     [facility] CM Care Plan Problem Two   Flowsheet Row Most Recent Value  Care Plan Problem Two  Perirectal irritation  Role Documenting the Problem Two  Care Management Telephonic Coordinator  Care Plan for Problem Two  Active  THN CM Short Term Goal #1 (0-30 days)  Patient will report improvement in symptoms over the next 30 days.   THN CM Short Term Goal #1 Start Date  11/29/15  Interventions for Short Term Goal #2   RN CM will monitor for clinical outcomes to current treatment regivme over the next 30 days.      [facility] CM Care Plan Problem Three   Flowsheet Row Most Recent Value  Care Plan Problem Three  Increased utilization of provider services   Role Documenting the Problem Three  Care Management Telephonic Coordinator   Care Plan for Problem Three  Active  THN CM Short Term Goal #1 (0-30 days)  Patient will engage with [facility] RN CM to discuss issues of concern to avoid unneccessary provider utilization over the next 30 days.   THN CM Short Term Goal #1 Start Date  11/29/15  Interventions for Short Term Goal #1  RN CM will engage patient in education and open discussion to assist with questions and answers to meet patient and provider needs over the next 30 days.       Mariann Laster, MSHL, BSN, RN, CCM  Wellsite geologist [phone] Direct  [phone] Cell [phone] Office [phone] Fax [email or web address]

## 2015-11-29 NOTE — Telephone Encounter (Signed)
Patient wants to talk with the nurse about her medication.

## 2015-11-30 ENCOUNTER — Telehealth: Payer: Self-pay | Admitting: Family Medicine

## 2015-11-30 NOTE — Telephone Encounter (Signed)
Sue [place] would like to speak with Dr. Maudie Mercury concerning the patient and I have committed to her a 2 business day turnaround for a call back.  Mrs. Flippin state that would be fine if Dr. Maudie Mercury could call her on her mobile Monday or Tuesday in the  P.M.

## 2015-12-03 ENCOUNTER — Other Ambulatory Visit: Payer: Self-pay | Admitting: Gastroenterology

## 2015-12-03 ENCOUNTER — Telehealth: Payer: Self-pay | Admitting: *Deleted

## 2015-12-03 ENCOUNTER — Other Ambulatory Visit: Payer: Self-pay

## 2015-12-03 ENCOUNTER — Ambulatory Visit: Payer: Medicare Other | Admitting: Family Medicine

## 2015-12-03 DIAGNOSIS — M1990 Unspecified osteoarthritis, unspecified site: Secondary | ICD-10-CM

## 2015-12-03 NOTE — Telephone Encounter (Signed)
Dr Havery Moros,  Just want to make sure this pt does not need an appt or repeat blood work. Last visit with labs was March of this year. Thank you.

## 2015-12-03 NOTE — Patient Outreach (Signed)
[facility]) Care Management  12/03/2015  Linda Thompson Nov 25, 1955 [id number]   Contact call to patient.  Patient reached for level of intervention follow-up.  Patient verifies and requested update to physical address  "[address]  [facility] [place] [id number]  [place], [place] [address] Patient states she prefers to see her MD because she "understands better when having a face to face visit"   Hemorrhoid, internal with perirectal irritation States perirectal itching has resolved.  Showered Saturday but not today or yesterday due to C/O back and leg pain which patient associates to arthritis in left leg.    Lower back pain which radiates down the front of left leg Patient associates pain to arthritis.   States pain started on Saturday.  Patient denies any falls.  Patient is taking medication,  Tramadol 50 mg 1 tablet daily  as needed for pain.  States h/o past pain in the same area but has never been this severe.   Patient is requesting a Corporate investment banker (with wheels/seat); MD aware.   PLAN:  RN CM notified Primary via routing this note.  RN CM requested order be sent to [facility] for Rolator (to be delivered).  RN CM requested review of pain management regime.  (Tramadol 1 tablet daily is not managing patients pain).   RN CM advised patient in next contact call within one week or as needed for questions, concerns or updates.   Nathaneil Canary, BSN, RN, [place] Management Care Management Coordinator [phone] Direct [phone] Cell [phone] Office [phone] Fax [email or web address]

## 2015-12-03 NOTE — Telephone Encounter (Signed)
Called [name]. She feels like pt would benefit from PT and OT for her back. She reports this is chronic and she used to see orthopedic doc and had some injections in the past. She does not feel she needs a walker. She is considering having her see a psychiatrist for evaluation to see if she needs to live as ALF as she "drives all of her doctors crazy." She calls doctors daily for minor ailments. Advised she may wish to schedule psychiatry evaluation for he chronic medical anxiety and mental challenges to see if they feels she would qualify for AL due to her mental health. She has appt with me about her back issues and will likely plan PT OT.

## 2015-12-03 NOTE — Telephone Encounter (Signed)
Followed up with patient this morning about her appointment with Dr. Maudie Mercury today. Patient states she was hoping to discuss at appointment the need for a walker. She specifically is looking for the 4-wheel walker that has a seat she can sit down on with storage under the seat. Advised patient I would make Dr. Maudie Mercury aware of request and that her 2:30pm appointment today is not warranted as no new symptoms have surfaced and is in no acute distress at this time.

## 2015-12-04 ENCOUNTER — Encounter: Payer: Self-pay | Admitting: Family Medicine

## 2015-12-04 ENCOUNTER — Ambulatory Visit (INDEPENDENT_AMBULATORY_CARE_PROVIDER_SITE_OTHER): Payer: Medicare Other | Admitting: Family Medicine

## 2015-12-04 VITALS — BP 128/84 | HR 100 | Temp 97.6°F | Ht 60.0 in | Wt 193.3 lb

## 2015-12-04 DIAGNOSIS — M545 Low back pain, unspecified: Secondary | ICD-10-CM

## 2015-12-04 NOTE — Patient Instructions (Signed)
BEFORE YOU LEAVE: -follow up:  1) follow-up in one month, please cancel any other appointments that are scheduled  It will be very important to stay mobile and get gentle activity as much as possible.  You can use Tylenol 500 mg tablets, 1-2 tablets up to 3 times per day for pain as needed.  Heat will be for 15 minutes twice daily. Please make sure to place a towel between your skin and a heating pad.  Topical sports creams that you can purchase over-the-counter can also help with the pain.  We sent an order for physical therapy and I have given you a prescription for a cane.  Follow-up with your orthopedic doctor if your worsening or you are not improving.  Please call our nurse line if you haven't any questions.

## 2015-12-04 NOTE — Progress Notes (Signed)
Pre visit review using our clinic review tool, if applicable. No additional management support is needed unless otherwise documented below in the visit note. 

## 2015-12-04 NOTE — Progress Notes (Addendum)
HPI:  Linda Thompson is a pleasant 60 year old with a past medical history significant for mental retardation and significant health related anxiety and lumbar degenerative disc disease here for an acute visit for low back pain. She reports she had onset of this pain in 2005 when she had a fall and was evaluated and treated with an orthopedic specialist, Dr. Lorin Thompson. She has not had any falls since then, but has occasional flares of pain in the low back. This flare pain started about 3 days ago. She has pain that ranges from mild severe at times in the right low back. She denies fevers, malaise, weakness, numbness, balance issues, gait disturbances or any recent falls. However if she sleeps on her right side she feels like that leg falls asleep. Also if she bears weight on the right side she feels like that hurts her low back. She has a habit of calling EMS whenever she has concerns, and she reports she called EMS twice yesterday for her back pain. We have been trying to help her understand better avenues for addressing her health concerns and we have a nurse here and [facility] and nurse and have agreed to help her. I also advised her health care power of attorney Linda Thompson to have her reevaluated with a psychiatrist to determine her level of cognitive abilities and also perhaps help treat the anxiety. She did not know what she could do for the pain and has questions about whether she can use heat versus ice and if she can use Tylenol. She wants a walker. Her healthcare power of attorney felt that a walker would actually be detrimental to her getting regular activity and requested that I not prescribe a walker. She is agreeable to doing physical therapy and her independent living center has already started this process to arrange to do PT and OT.   ROS: See pertinent positives and negatives per HPI.  Past Medical History:  Diagnosis Date  . Allergic rhinitis   . Allergy   . Anemia   . Chronic low back pain   .  DDD (degenerative disc disease), lumbar   . GERD (gastroesophageal reflux disease)    w/ hx diaphragmatic hernia  . Glaucoma   . GLAUCOMA NOS 03/11/2006   Qualifier: Diagnosis of  By: Linda Browner MD, Linda Thompson    . Heart murmur   . Hx of fracture    R arm, foot  . Hyperlipemia   . Hypertension   . IBS (irritable bowel syndrome)   . Low back pain 11/22/2012  . Meningioma ([place])    ant falx, stable on prior imaging  . Moderate intellectual disabilities   . Murmur   . Obesity   . Osteoarthrosis, unspecified whether generalized or localized, ankle and foot   . Sarcoidosis ([place])    difuse bony lesions and LDA, dx by biopsy in 2016  . Transaminitis   . Type II or unspecified type diabetes mellitus without mention of complication, not stated as uncontrolled     Past Surgical History:  Procedure Laterality Date  . CATARACT EXTRACTION    . COLONOSCOPY  04/15/2011  . ELBOW SURGERY     right elbow  . LIPOMA EXCISION Left [id number]   posterior left axilla  . TOTAL ABDOMINAL HYSTERECTOMY  03/05/1992   leiomyoma and cellular leiomyoma    Family History  Problem Relation Age of Onset  . Diabetes Mother   . Stroke Mother   . Hypertension Mother   . Stroke Father   .  Diabetes Maternal Aunt   . Heart disease Maternal Aunt      (had pacemaker)  . Diabetes Maternal Grandmother   . Stroke Maternal Grandmother   . Hypertension Maternal Grandmother   . Colon cancer Neg Hx     Social History   Social History  . Marital status: Single    Spouse name: N/A  . Number of children: 0  . Years of education: N/A   Occupational History  . Disabled (from arm fracture)    Social History Main Topics  . Smoking status: Never Smoker  . Smokeless tobacco: Never Used  . Alcohol use No  . Drug use: No  . Sexual activity: Not Currently    Birth control/ protection: Surgical     Comment: Hyst--TAH--unsure if has ovaries   Other Topics Concern  . None   Social History Narrative   Lives at [facility] (on [address] in [place])      [place] for arm fracture      Mental Retardation, but is able to drive to places she is comfortable with and prepare her own meals and manage most of her own affairs.      Ms. Linda Thompson (neighbor) is POA/HCPOA      Reports she gets regular exercise and tries to eat healthy     Current Outpatient Prescriptions:  .  acetaminophen (TYLENOL) 500 MG tablet, Take 500 mg by mouth as needed (left ear pain). Reported on 04/18/2015, Disp: , Rfl:  .  diltiazem (CARDIZEM CD) 240 MG 24 hr capsule, TAKE ONE CAPSULE BY MOUTH EVERY DAY, Disp: 90 capsule, Rfl: 1 .  ferrous sulfate 325 (65 FE) MG tablet, TAKE 1 TABLET (325 MG TOTAL) BY MOUTH 2 (TWO) TIMES DAILY WITH A MEAL., Disp: 60 tablet, Rfl: 2 .  hydrochlorothiazide (MICROZIDE) 12.5 MG capsule, TAKE 1 CAPSULE BY MOUTH ONCE DAILY, Disp: 90 capsule, Rfl: 1 .  hydrocortisone 2.5 % ointment, APPLY TO AFFECTED AREA TWICE A DAY, Disp: , Rfl: 0 .  ipratropium (ATROVENT) 0.06 % nasal spray, PLACE 2 SPRAYS INTO BOTH NOSTRILS 4 (FOUR) TIMES DAILY., Disp: 45 mL, Rfl: 3 .  ketoconazole (NIZORAL) 2 % shampoo, APPLY TO AFFECTED AREA TWICE A WEEK AS DIRECTED, Disp: 120 mL, Rfl: 1 .  Lactobacillus (FLORAJEN ACIDOPHILUS PO), Take 1 tablet by mouth daily., Disp: , Rfl:  .  latanoprost (XALATAN) 0.005 % ophthalmic solution, Place 1 drop into both eyes at bedtime. , Disp: , Rfl:  .  liver oil-zinc oxide (DESITIN) 40 % ointment, Apply 1 application topically as needed for irritation., Disp: , Rfl:  .  loperamide (IMODIUM) 2 MG capsule, Take 2 mg by mouth as needed for diarrhea or loose stools (PRN diarrhea)., Disp: , Rfl:  .  loratadine (CLARITIN) 10 MG tablet, Take 10 mg by mouth daily., Disp: , Rfl:  .  metFORMIN (GLUCOPHAGE) 500 MG tablet, TAKE 1 TABLET BY MOUTH TWICE A DAY WITH A MEAL, Disp: 180 tablet, Rfl: 1 .  omeprazole (PRILOSEC) 20 MG capsule, TAKE 1 CAPSULE (20 MG TOTAL) BY MOUTH 2  (TWO) TIMES DAILY., Disp: 180 capsule, Rfl: 3 .  potassium chloride SA (KLOR-CON M20) 20 MEQ tablet, Take 1 tablet (20 mEq total) by mouth daily., Disp: 90 tablet, Rfl: 3 .  pravastatin (PRAVACHOL) 40 MG tablet, TAKE 1 TABLET BY MOUTH EVERY EVENING, Disp: 90 tablet, Rfl: 1 .  Propylene Glycol (SYSTANE BALANCE) 0.6 % SOLN, , Disp: , Rfl:  .  traMADol (ULTRAM) 50 MG  tablet, Take 1 tablet (50 mg total) by mouth daily., Disp: 30 tablet, Rfl: 0  EXAM:  Vitals:   12/04/15 1100  BP: 128/84  Pulse: 100  Temp: 97.6 F (36.4 C)    Body mass index is 37.75 kg/m.  GENERAL: vitals reviewed and listed above, alert, oriented, appears well hydrated and in no acute distress  HEENT: atraumatic, conjunttiva clear, no obvious abnormalities on inspection of external nose and ears  NECK: no obvious masses on inspection  LUNGS: clear to auscultation bilaterally, no wheezes, rales or rhonchi, good air movement  CV: HRRR, no peripheral edema  MS: moves all extremities without noticeable abnormality Normal Gait  Normal inspection of back, no obvious scoliosis or leg length descrepancy No bony TTP Soft tissue TTP at: R lumbar paraspinal mucles -/+ tests: neg trendelenburg,-facet loading, -SLRT, -CLRT, -FABER, -FADIR Normal muscle strength, sensation to light touch and DTRs in LEs bilaterally  PSYCH: pleasant and cooperative, no obvious depression or anxiety  ASSESSMENT AND PLAN:  Discussed the following assessment and plan:  Right-sided low back pain without sciatica, unspecified chronicity  -singed form from [facility] for PT/OT - gave to assistant to fax -rx for cane provided -symptomatic care options discussed -advised follow up with her specialist if worsening or persists and her ein 1 month -had long discussion about various avenues for help with health related questions - nurse line, urgent care, ems, etc  -Patient advised to return or notify a doctor immediately if symptoms worsen or persist  or new concerns arise.  Patient Instructions  BEFORE YOU LEAVE: -follow up:  1) follow-up in one month, please cancel any other appointments that are scheduled  It will be very important to stay mobile and get gentle activity as much as possible.  You can use Tylenol 500 mg tablets, 1-2 tablets up to 3 times per day for pain as needed.  Heat will be for 15 minutes twice daily. Please make sure to place a towel between your skin and a heating pad.  Topical sports creams that you can purchase over-the-counter can also help with the pain.  We sent an order for physical therapy and I have given you a prescription for a cane.  Follow-up with your orthopedic doctor if your worsening or you are not improving.  Please call our nurse line if you haven't any questions.   [name] R., DO

## 2015-12-04 NOTE — Telephone Encounter (Signed)
Hi Linda Thompson, We can order her CBC, ferritin, TIBC and notify her of results, no appointment needed at this time. Anemia due to large gastric polyps which she has had resected by Dr. Arsenio Loader at [facility]. Thanks

## 2015-12-05 ENCOUNTER — Other Ambulatory Visit: Payer: Self-pay

## 2015-12-05 DIAGNOSIS — Z7984 Long term (current) use of oral hypoglycemic drugs: Secondary | ICD-10-CM

## 2015-12-05 DIAGNOSIS — E119 Type 2 diabetes mellitus without complications: Secondary | ICD-10-CM | POA: Diagnosis not present

## 2015-12-05 DIAGNOSIS — K317 Polyp of stomach and duodenum: Secondary | ICD-10-CM

## 2015-12-05 DIAGNOSIS — Z79891 Long term (current) use of opiate analgesic: Secondary | ICD-10-CM

## 2015-12-05 DIAGNOSIS — M5136 Other intervertebral disc degeneration, lumbar region: Secondary | ICD-10-CM | POA: Diagnosis not present

## 2015-12-05 DIAGNOSIS — I10 Essential (primary) hypertension: Secondary | ICD-10-CM | POA: Diagnosis not present

## 2015-12-05 DIAGNOSIS — M6281 Muscle weakness (generalized): Secondary | ICD-10-CM | POA: Diagnosis not present

## 2015-12-05 DIAGNOSIS — H409 Unspecified glaucoma: Secondary | ICD-10-CM | POA: Diagnosis not present

## 2015-12-05 DIAGNOSIS — F71 Moderate intellectual disabilities: Secondary | ICD-10-CM | POA: Diagnosis not present

## 2015-12-05 NOTE — Telephone Encounter (Signed)
Labs ordered and pt informed. Pt is not able to come today but will have her friend bring her in the next few days.

## 2015-12-07 DIAGNOSIS — H409 Unspecified glaucoma: Secondary | ICD-10-CM | POA: Diagnosis not present

## 2015-12-07 DIAGNOSIS — M5136 Other intervertebral disc degeneration, lumbar region: Secondary | ICD-10-CM | POA: Diagnosis not present

## 2015-12-07 DIAGNOSIS — E119 Type 2 diabetes mellitus without complications: Secondary | ICD-10-CM | POA: Diagnosis not present

## 2015-12-07 DIAGNOSIS — M6281 Muscle weakness (generalized): Secondary | ICD-10-CM | POA: Diagnosis not present

## 2015-12-07 DIAGNOSIS — F71 Moderate intellectual disabilities: Secondary | ICD-10-CM | POA: Diagnosis not present

## 2015-12-07 DIAGNOSIS — I10 Essential (primary) hypertension: Secondary | ICD-10-CM | POA: Diagnosis not present

## 2015-12-11 ENCOUNTER — Emergency Department (HOSPITAL_COMMUNITY)
Admission: EM | Admit: 2015-12-11 | Discharge: 2015-12-11 | Disposition: A | Discharge status: Home / Self Care | Payer: Medicare Other | Attending: Emergency Medicine | Admitting: Emergency Medicine

## 2015-12-11 ENCOUNTER — Ambulatory Visit (INDEPENDENT_AMBULATORY_CARE_PROVIDER_SITE_OTHER): Payer: Medicare Other

## 2015-12-11 ENCOUNTER — Ambulatory Visit: Payer: Medicare Other | Admitting: Family Medicine

## 2015-12-11 ENCOUNTER — Emergency Department (HOSPITAL_COMMUNITY): Payer: Medicare Other

## 2015-12-11 ENCOUNTER — Encounter (HOSPITAL_COMMUNITY): Payer: Self-pay | Admitting: Emergency Medicine

## 2015-12-11 VITALS — BP 144/90 | HR 96 | Ht 62.0 in | Wt 188.3 lb

## 2015-12-11 DIAGNOSIS — S80212A Abrasion, left knee, initial encounter: Secondary | ICD-10-CM | POA: Diagnosis not present

## 2015-12-11 DIAGNOSIS — E119 Type 2 diabetes mellitus without complications: Secondary | ICD-10-CM | POA: Diagnosis not present

## 2015-12-11 DIAGNOSIS — Z7984 Long term (current) use of oral hypoglycemic drugs: Secondary | ICD-10-CM | POA: Insufficient documentation

## 2015-12-11 DIAGNOSIS — I10 Essential (primary) hypertension: Secondary | ICD-10-CM | POA: Diagnosis not present

## 2015-12-11 DIAGNOSIS — Z0000 Encounter for general adult medical examination without abnormal findings: Secondary | ICD-10-CM

## 2015-12-11 DIAGNOSIS — W1830XA Fall on same level, unspecified, initial encounter: Secondary | ICD-10-CM | POA: Diagnosis not present

## 2015-12-11 DIAGNOSIS — S99912A Unspecified injury of left ankle, initial encounter: Secondary | ICD-10-CM | POA: Diagnosis not present

## 2015-12-11 DIAGNOSIS — R404 Transient alteration of awareness: Secondary | ICD-10-CM | POA: Diagnosis not present

## 2015-12-11 DIAGNOSIS — Y939 Activity, unspecified: Secondary | ICD-10-CM | POA: Diagnosis not present

## 2015-12-11 DIAGNOSIS — Y929 Unspecified place or not applicable: Secondary | ICD-10-CM | POA: Insufficient documentation

## 2015-12-11 DIAGNOSIS — Y999 Unspecified external cause status: Secondary | ICD-10-CM | POA: Diagnosis not present

## 2015-12-11 DIAGNOSIS — Z0289 Encounter for other administrative examinations: Secondary | ICD-10-CM

## 2015-12-11 DIAGNOSIS — S50312A Abrasion of left elbow, initial encounter: Secondary | ICD-10-CM | POA: Insufficient documentation

## 2015-12-11 DIAGNOSIS — M25572 Pain in left ankle and joints of left foot: Secondary | ICD-10-CM

## 2015-12-11 DIAGNOSIS — Z79899 Other long term (current) drug therapy: Secondary | ICD-10-CM | POA: Insufficient documentation

## 2015-12-11 DIAGNOSIS — R531 Weakness: Secondary | ICD-10-CM | POA: Diagnosis not present

## 2015-12-11 NOTE — ED Triage Notes (Signed)
Per EMS-fell getting out of car-states she has a history of arthritis and that is why she fell-pain to left ankle, no deformity

## 2015-12-11 NOTE — Patient Instructions (Addendum)
Ms. Agostinelli , Thank you for taking time to come for your Medicare Wellness Visit. I appreciate your ongoing commitment to your health goals. Please review the following plan we discussed and let me know if I can assist you in the future.   Please work exercise into your day;  Toning as talked about    These are the goals we discussed: Goals    . patient          Will discuss Maintenance with PT  Will discuss how to create safe toning exercise; sitter exercise and plan the time of day and what you will use  Work on the number of times you are exercising; and limits; time frames; 15 min or 30 min   You may want to practice steps        This is a list of the screening recommended for you and due dates:  Health Maintenance  Topic Date Due  . Complete foot exam   02/01/2016  . Hemoglobin A1C  02/14/2016  . Eye exam for diabetics  05/28/2016  . Urine Protein Check  08/13/2016  . Mammogram  09/18/2016  . Pap Smear  01/16/2017  . Colon Cancer Screening  04/18/2018  . Tetanus Vaccine  08/07/2024  . Pneumococcal vaccine  Completed  . Flu Shot  Addressed  . Shingles Vaccine  Completed  .  Hepatitis C: One time screening is recommended by Center for Disease Control  (CDC) for  adults born from [birth year] through 1966.   Completed  . HIV Screening  Completed     Fall Prevention in the Home Introduction Falls can cause injuries. They can happen to people of all ages. There are many things you can do to make your home safe and to help prevent falls. What can I do on the outside of my home?  Regularly fix the edges of walkways and driveways and fix any cracks.  Remove anything that might make you trip as you walk through a door, such as a raised step or threshold.  Trim any bushes or trees on the path to your home.  Use bright outdoor lighting.  Clear any walking paths of anything that might make someone trip, such as rocks or tools.  Regularly check to see if handrails are loose  or broken. Make sure that both sides of any steps have handrails.  Any raised decks and porches should have guardrails on the edges.  Have any leaves, snow, or ice cleared regularly.  Use sand or salt on walking paths during winter.  Clean up any spills in your garage right away. This includes oil or grease spills. What can I do in the bathroom?  Use night lights.  Install grab bars by the toilet and in the tub and shower. Do not use towel bars as grab bars.  Use non-skid mats or decals in the tub or shower.  If you need to sit down in the shower, use a plastic, non-slip stool.  Keep the floor dry. Clean up any water that spills on the floor as soon as it happens.  Remove soap buildup in the tub or shower regularly.  Attach bath mats securely with double-sided non-slip rug tape.  Do not have throw rugs and other things on the floor that can make you trip. What can I do in the bedroom?  Use night lights.  Make sure that you have a light by your bed that is easy to reach.  Do not use  any sheets or blankets that are too big for your bed. They should not hang down onto the floor.  Have a firm chair that has side arms. You can use this for support while you get dressed.  Do not have throw rugs and other things on the floor that can make you trip. What can I do in the kitchen?  Clean up any spills right away.  Avoid walking on wet floors.  Keep items that you use a lot in easy-to-reach places.  If you need to reach something above you, use a strong step stool that has a grab bar.  Keep electrical cords out of the way.  Do not use floor polish or wax that makes floors slippery. If you must use wax, use non-skid floor wax.  Do not have throw rugs and other things on the floor that can make you trip. What can I do with my stairs?  Do not leave any items on the stairs.  Make sure that there are handrails on both sides of the stairs and use them. Fix handrails that are  broken or loose. Make sure that handrails are as long as the stairways.  Check any carpeting to make sure that it is firmly attached to the stairs. Fix any carpet that is loose or worn.  Avoid having throw rugs at the top or bottom of the stairs. If you do have throw rugs, attach them to the floor with carpet tape.  Make sure that you have a light switch at the top of the stairs and the bottom of the stairs. If you do not have them, ask someone to add them for you. What else can I do to help prevent falls?  Wear shoes that:  Do not have high heels.  Have rubber bottoms.  Are comfortable and fit you well.  Are closed at the toe. Do not wear sandals.  If you use a stepladder:  Make sure that it is fully opened. Do not climb a closed stepladder.  Make sure that both sides of the stepladder are locked into place.  Ask someone to hold it for you, if possible.  Clearly mark and make sure that you can see:  Any grab bars or handrails.  First and last steps.  Where the edge of each step is.  Use tools that help you move around (mobility aids) if they are needed. These include:  Canes.  Walkers.  Scooters.  Crutches.  Turn on the lights when you go into a dark area. Replace any light bulbs as soon as they burn out.  Set up your furniture so you have a clear path. Avoid moving your furniture around.  If any of your floors are uneven, fix them.  If there are any pets around you, be aware of where they are.  Review your medicines with your doctor. Some medicines can make you feel dizzy. This can increase your chance of falling. Ask your doctor what other things that you can do to help prevent falls. This information is not intended to replace advice given to you by your health care provider. Make sure you discuss any questions you have with your health care provider. Document Released: 11/02/2008 Document Revised: 06/14/2015 Document Reviewed: 02/10/2014  2017  [employer]  Health Maintenance, Female Introduction Adopting a healthy lifestyle and getting preventive care can go a long way to promote health and wellness. Talk with your health care provider about what schedule of regular examinations is right for you. This  is a good chance for you to check in with your provider about disease prevention and staying healthy. In between checkups, there are plenty of things you can do on your own. Experts have done a lot of research about which lifestyle changes and preventive measures are most likely to keep you healthy. Ask your health care provider for more information. Weight and diet Eat a healthy diet  Be sure to include plenty of vegetables, fruits, low-fat dairy products, and lean protein.  Do not eat a lot of foods high in solid fats, added sugars, or salt.  Get regular exercise. This is one of the most important things you can do for your health.  Most adults should exercise for at least 150 minutes each week. The exercise should increase your heart rate and make you sweat (moderate-intensity exercise).  Most adults should also do strengthening exercises at least twice a week. This is in addition to the moderate-intensity exercise. Maintain a healthy weight  Body mass index (BMI) is a measurement that can be used to identify possible weight problems. It estimates body fat based on height and weight. Your health care provider can help determine your BMI and help you achieve or maintain a healthy weight.  For females 35 years of age and older:  A BMI below 18.5 is considered underweight.  A BMI of 18.5 to 24.9 is normal.  A BMI of 25 to 29.9 is considered overweight.  A BMI of 30 and above is considered obese. Watch levels of cholesterol and blood lipids  You should start having your blood tested for lipids and cholesterol at 61 years of age, then have this test every 5 years.  You may need to have your cholesterol levels checked more often  if:  Your lipid or cholesterol levels are high.  You are older than 61 years of age.  You are at high risk for heart disease. Cancer screening Lung Cancer  Lung cancer screening is recommended for adults 16-21 years old who are at high risk for lung cancer because of a history of smoking.  A yearly low-dose CT scan of the lungs is recommended for people who:  Currently smoke.  Have quit within the past 15 years.  Have at least a 30-pack-year history of smoking. A pack year is smoking an average of one pack of cigarettes a day for 1 year.  Yearly screening should continue until it has been 15 years since you quit.  Yearly screening should stop if you develop a health problem that would prevent you from having lung cancer treatment. Breast Cancer  Practice breast self-awareness. This means understanding how your breasts normally appear and feel.  It also means doing regular breast self-exams. Let your health care provider know about any changes, no matter how small.  If you are in your 20s or 30s, you should have a clinical breast exam (CBE) by a health care provider every 1-3 years as part of a regular health exam.  If you are 32 or older, have a CBE every year. Also consider having a breast X-ray (mammogram) every year.  If you have a family history of breast cancer, talk to your health care provider about genetic screening.  If you are at high risk for breast cancer, talk to your health care provider about having an MRI and a mammogram every year.  Breast cancer gene (BRCA) assessment is recommended for women who have family members with BRCA-related cancers. BRCA-related cancers include:  Breast.  Ovarian.  Tubal.  Peritoneal cancers.  Results of the assessment will determine the need for genetic counseling and BRCA1 and BRCA2 testing. Cervical Cancer  Your health care provider may recommend that you be screened regularly for cancer of the pelvic organs (ovaries,  uterus, and vagina). This screening involves a pelvic examination, including checking for microscopic changes to the surface of your cervix (Pap test). You may be encouraged to have this screening done every 3 years, beginning at age 52.  For women ages 45-65, health care providers may recommend pelvic exams and Pap testing every 3 years, or they may recommend the Pap and pelvic exam, combined with testing for human papilloma virus (HPV), every 5 years. Some types of HPV increase your risk of cervical cancer. Testing for HPV may also be done on women of any age with unclear Pap test results.  Other health care providers may not recommend any screening for nonpregnant women who are considered low risk for pelvic cancer and who do not have symptoms. Ask your health care provider if a screening pelvic exam is right for you.  If you have had past treatment for cervical cancer or a condition that could lead to cancer, you need Pap tests and screening for cancer for at least 20 years after your treatment. If Pap tests have been discontinued, your risk factors (such as having a new sexual partner) need to be reassessed to determine if screening should resume. Some women have medical problems that increase the chance of getting cervical cancer. In these cases, your health care provider may recommend more frequent screening and Pap tests. Colorectal Cancer  This type of cancer can be detected and often prevented.  Routine colorectal cancer screening usually begins at 61 years of age and continues through 61 years of age.  Your health care provider may recommend screening at an earlier age if you have risk factors for colon cancer.  Your health care provider may also recommend using home test kits to check for hidden blood in the stool.  A small camera at the end of a tube can be used to examine your colon directly (sigmoidoscopy or colonoscopy). This is done to check for the earliest forms of colorectal  cancer.  Routine screening usually begins at age 33.  Direct examination of the colon should be repeated every 5-10 years through 61 years of age. However, you may need to be screened more often if early forms of precancerous polyps or small growths are found. Skin Cancer  Check your skin from head to toe regularly.  Tell your health care provider about any new moles or changes in moles, especially if there is a change in a mole's shape or color.  Also tell your health care provider if you have a mole that is larger than the size of a pencil eraser.  Always use sunscreen. Apply sunscreen liberally and repeatedly throughout the day.  Protect yourself by wearing long sleeves, pants, a wide-brimmed hat, and sunglasses whenever you are outside. Heart disease, diabetes, and high blood pressure  High blood pressure causes heart disease and increases the risk of stroke. High blood pressure is more likely to develop in:  People who have blood pressure in the high end of the normal range (130-139/85-89 mm Hg).  People who are overweight or obese.  People who are African American.  If you are 64-52 years of age, have your blood pressure checked every 3-5 years. If you are 14 years of age or older, have your  blood pressure checked every year. You should have your blood pressure measured twice-once when you are at a hospital or clinic, and once when you are not at a hospital or clinic. Record the average of the two measurements. To check your blood pressure when you are not at a hospital or clinic, you can use:  An automated blood pressure machine at a pharmacy.  A home blood pressure monitor.  If you are between 58 years and 65 years old, ask your health care provider if you should take aspirin to prevent strokes.  Have regular diabetes screenings. This involves taking a blood sample to check your fasting blood sugar level.  If you are at a normal weight and have a low risk for diabetes,  have this test once every three years after 61 years of age.  If you are overweight and have a high risk for diabetes, consider being tested at a younger age or more often. Preventing infection Hepatitis B  If you have a higher risk for hepatitis B, you should be screened for this virus. You are considered at high risk for hepatitis B if:  You were born in a country where hepatitis B is common. Ask your health care provider which countries are considered high risk.  Your parents were born in a high-risk country, and you have not been immunized against hepatitis B (hepatitis B vaccine).  You have HIV or AIDS.  You use needles to inject street drugs.  You live with someone who has hepatitis B.  You have had sex with someone who has hepatitis B.  You get hemodialysis treatment.  You take certain medicines for conditions, including cancer, organ transplantation, and autoimmune conditions. Hepatitis C  Blood testing is recommended for:  Everyone born from [birth year] through 1966.  Anyone with known risk factors for hepatitis C. Sexually transmitted infections (STIs)  You should be screened for sexually transmitted infections (STIs) including gonorrhea and chlamydia if:  You are sexually active and are younger than 61 years of age.  You are older than 61 years of age and your health care provider tells you that you are at risk for this type of infection.  Your sexual activity has changed since you were last screened and you are at an increased risk for chlamydia or gonorrhea. Ask your health care provider if you are at risk.  If you do not have HIV, but are at risk, it may be recommended that you take a prescription medicine daily to prevent HIV infection. This is called pre-exposure prophylaxis (PrEP). You are considered at risk if:  You are sexually active and do not regularly use condoms or know the HIV status of your partner(s).  You take drugs by injection.  You are sexually  active with a partner who has HIV. Talk with your health care provider about whether you are at high risk of being infected with HIV. If you choose to begin PrEP, you should first be tested for HIV. You should then be tested every 3 months for as long as you are taking PrEP. Pregnancy  If you are premenopausal and you may become pregnant, ask your health care provider about preconception counseling.  If you may become pregnant, take 400 to 800 micrograms (mcg) of folic acid every day.  If you want to prevent pregnancy, talk to your health care provider about birth control (contraception). Osteoporosis and menopause  Osteoporosis is a disease in which the bones lose minerals and strength with aging. This can  result in serious bone fractures. Your risk for osteoporosis can be identified using a bone density scan.  If you are 3 years of age or older, or if you are at risk for osteoporosis and fractures, ask your health care provider if you should be screened.  Ask your health care provider whether you should take a calcium or vitamin D supplement to lower your risk for osteoporosis.  Menopause may have certain physical symptoms and risks.  Hormone replacement therapy may reduce some of these symptoms and risks. Talk to your health care provider about whether hormone replacement therapy is right for you. Follow these instructions at home:  Schedule regular health, dental, and eye exams.  Stay current with your immunizations.  Do not use any tobacco products including cigarettes, chewing tobacco, or electronic cigarettes.  If you are pregnant, do not drink alcohol.  If you are breastfeeding, limit how much and how often you drink alcohol.  Limit alcohol intake to no more than 1 drink per day for nonpregnant women. One drink equals 12 ounces of beer, 5 ounces of wine, or 1 ounces of hard liquor.  Do not use street drugs.  Do not share needles.  Ask your health care provider for  help if you need support or information about quitting drugs.  Tell your health care provider if you often feel depressed.  Tell your health care provider if you have ever been abused or do not feel safe at home. This information is not intended to replace advice given to you by your health care provider. Make sure you discuss any questions you have with your health care provider. Document Released: 07/22/2010 Document Revised: 06/14/2015 Document Reviewed: 10/10/2014  2017 [employer]

## 2015-12-11 NOTE — Progress Notes (Signed)
Subjective:   Linda Thompson is a 60 y.o. female who presents for an Initial Medicare Annual Wellness Visit.  The Patient was informed that the wellness visit is to identify future health risk and educate and initiate measures that can reduce risk for increased disease through the lifespan.    NO ROS; Medicare Wellness Visit Last OV 11/14; calling EMS; referred POW to psychiatry apt.   Lives in apt x 4 years Has cousins; does not see her   Describes health as good, fair or great?  Fair What would make it great? dont' know   Preventive Screening -Counseling & Management  Endoscopic; 03/2015 recall in 3 to 4 years  Endooscopy stomach polyps to repeat in 3 to  4 years for endo  Dr. Arsenio Loader  in [place] and referred by Dr. Bjorn Loser at [place] 08/2015;  DEXA < 66  Current smoking/ tobacco status/ never smoked Second Hand Smoke status; No Smokers in the home Hx of sarcoidosis but breathing is good   ETOH neg   RISK FACTORS Lives at the [facility]; her apt is here; no access to food Special dinners on occasion; Have lunch;  BP moderate but is 120/ 80 at  Home when checked  [facility] assessing; Requesting rolator  Regular exercise issues with back; referred to PT / OT Lives on the 2nd floor Does not walk for exercise due to it hurts her leg  Hx of injections in her back from OA Suggested stretch bands;  Sitter exercise   Diet lots of pot's pies Frozen and cooks in microwave;  Breakfast; eggs, grits and drnks water   Dentist; 2 times a year;   Medicine; takes  Fall risk; had has some falls 2005 and broke her elbow;  Not recently  Mobility of Functional changes this year? About the same  Safety; community, wears sunscreen, safe place for firearms; Motor vehicle accidents; she does drive; but discussing having groceries and other items due to her back; but trying to work on this  She does drive;  Using cane since last wed.    Cardiac Risk Factors:  Advanced aged >  14 in men; >65 in women Hyperlipidemia hdl 40; LDL 92; Trig 55  Diabetes- type 2  A1c 6.7 Family History ( DM, Stroke, ) Obesity 27   Eye exam hx of glaucoma/ 05/2015 completed  [facility] Dr. Katy Fitch sees her bi-annually and report is no retinopathy   Depression Screen PhQ 2: negative  Activities of Daily Living - See functional screen   Cognitive testing; Ad8 score; 0 or less than 2  MMSE deferred or completed if AD8 + 2 issues  Advanced Directives completed and HCPOA is with her today Does have a living will   List the name of Physicians or other Practitioners you currently use:   Immunization History  Administered Date(s) Administered  . Influenza Split 09/19/2010  . Influenza Whole 11/12/2004, 10/21/2006, 10/18/2007, 10/19/2008, 10/25/2009  . Influenza,inj,Quad PF,36+ Mos 09/17/2012, 09/19/2013, 09/13/2014  . Influenza-Unspecified 08/31/2015  . Pneumococcal Polysaccharide-23 03/10/2002, 02/08/2009  . Td 03/10/2002, 08/08/2014  . Zoster 08/08/2014   Required Immunizations needed today  Screening test up to date or reviewed for plan of completion There are no preventive care reminders to display for this patient.    Cardiac Risk Factors include: advanced age (>62men, >11 women);diabetes mellitus;hypertension;obesity (BMI >30kg/m2);family history of premature cardiovascular disease;sedentary lifestyle     Objective:    Today's Vitals   12/11/15 1010  BP: (!) 144/90  Pulse: 96  SpO2: 95%  Weight: 188 lb 5 oz (85.4 kg)  Height: 5\' 2"  (1.575 m)   Body mass index is 34.44 kg/m.   Current Medications (verified) Outpatient Encounter Prescriptions as of 12/11/2015  Medication Sig  . acetaminophen (TYLENOL) 500 MG tablet Take 500 mg by mouth as needed (left ear pain). Reported on 04/18/2015  . diltiazem (CARDIZEM CD) 240 MG 24 hr capsule TAKE ONE CAPSULE BY MOUTH EVERY DAY  . ferrous sulfate 325 (65 FE) MG tablet TAKE 1 TABLET (325 MG  TOTAL) BY MOUTH 2 (TWO) TIMES DAILY WITH A MEAL.  . hydrochlorothiazide (MICROZIDE) 12.5 MG capsule TAKE 1 CAPSULE BY MOUTH ONCE DAILY  . hydrocortisone 2.5 % ointment APPLY TO AFFECTED AREA TWICE A DAY  . ipratropium (ATROVENT) 0.06 % nasal spray PLACE 2 SPRAYS INTO BOTH NOSTRILS 4 (FOUR) TIMES DAILY.  Marland Kitchen ketoconazole (NIZORAL) 2 % shampoo APPLY TO AFFECTED AREA TWICE A WEEK AS DIRECTED  . Lactobacillus (FLORAJEN ACIDOPHILUS PO) Take 1 tablet by mouth daily.  Marland Kitchen latanoprost (XALATAN) 0.005 % ophthalmic solution Place 1 drop into both eyes at bedtime.   Marland Kitchen liver oil-zinc oxide (DESITIN) 40 % ointment Apply 1 application topically as needed for irritation.  Marland Kitchen loperamide (IMODIUM) 2 MG capsule Take 2 mg by mouth as needed for diarrhea or loose stools (PRN diarrhea).  . loratadine (CLARITIN) 10 MG tablet Take 10 mg by mouth daily.  . metFORMIN (GLUCOPHAGE) 500 MG tablet TAKE 1 TABLET BY MOUTH TWICE A DAY WITH A MEAL  . omeprazole (PRILOSEC) 20 MG capsule TAKE 1 CAPSULE (20 MG TOTAL) BY MOUTH 2 (TWO) TIMES DAILY.  Marland Kitchen potassium chloride SA (KLOR-CON M20) 20 MEQ tablet Take 1 tablet (20 mEq total) by mouth daily.  . pravastatin (PRAVACHOL) 40 MG tablet TAKE 1 TABLET BY MOUTH EVERY EVENING  . Propylene Glycol (SYSTANE BALANCE) 0.6 % SOLN   . traMADol (ULTRAM) 50 MG tablet Take 1 tablet (50 mg total) by mouth daily.   No facility-administered encounter medications on file as of 12/11/2015.     Allergies (verified) Ace inhibitors; Amoxicillin-pot clavulanate; Augmentin [amoxicillin-pot clavulanate]; Azithromycin; Ceftin [cefuroxime axetil]; Cefuroxime; Cyclobenzaprine hcl; Flexeril [cyclobenzaprine]; Flonase [fluticasone propionate]; Fluticasone; and Ibuprofen   History: Past Medical History:  Diagnosis Date  . Allergic rhinitis   . Allergy   . Anemia   . Chronic low back pain   . DDD (degenerative disc disease), lumbar   . GERD (gastroesophageal reflux disease)    w/ hx diaphragmatic hernia  .  Glaucoma   . GLAUCOMA NOS 03/11/2006   Qualifier: Diagnosis of  By: Diona Browner MD, Amy    . Heart murmur   . Hx of fracture    R arm, foot  . Hyperlipemia   . Hypertension   . IBS (irritable bowel syndrome)   . Low back pain 11/22/2012  . Meningioma ([place])    ant falx, stable on prior imaging  . Moderate intellectual disabilities   . Murmur   . Obesity   . Osteoarthrosis, unspecified whether generalized or localized, ankle and foot   . Sarcoidosis ([place])    difuse bony lesions and LDA, dx by biopsy in 2016  . Transaminitis   . Type II or unspecified type diabetes mellitus without mention of complication, not stated as uncontrolled    Past Surgical History:  Procedure Laterality Date  . CATARACT EXTRACTION    . COLONOSCOPY  04/15/2011  . ELBOW SURGERY     right elbow  .  LIPOMA EXCISION Left [id number]   posterior left axilla  . TOTAL ABDOMINAL HYSTERECTOMY  03/05/1992   leiomyoma and cellular leiomyoma   Family History  Problem Relation Age of Onset  . Diabetes Mother   . Stroke Mother   . Hypertension Mother   . Stroke Father   . Diabetes Maternal Aunt   . Heart disease Maternal Aunt      (had pacemaker)  . Diabetes Maternal Grandmother   . Stroke Maternal Grandmother   . Hypertension Maternal Grandmother   . Colon cancer Neg Hx    Social History   Occupational History  . Disabled (from arm fracture)    Social History Main Topics  . Smoking status: Never Smoker  . Smokeless tobacco: Never Used  . Alcohol use No  . Drug use: No  . Sexual activity: Not Currently    Birth control/ protection: Surgical     Comment: Hyst--TAH--unsure if has ovaries    Tobacco Counseling Counseling given: Not Answered   Activities of Daily Living In your present state of health, do you have any difficulty performing the following activities: 12/11/2015 12/03/2015  Hearing? N -  Vision? N -  Difficulty concentrating or making decisions? N -  Walking or climbing stairs? N Y    Dressing or bathing? N -  Doing errands, shopping? N -  Preparing Food and eating ? N -  Using the Toilet? N -  In the past six months, have you accidently leaked urine? N -  Do you have problems with loss of bowel control? N -  Managing your Medications? N -  Managing your Finances? N -  Housekeeping or managing your Housekeeping? - -  Some recent data might be hidden    Immunizations and Health Maintenance Immunization History  Administered Date(s) Administered  . Influenza Split 09/19/2010  . Influenza Whole 11/12/2004, 10/21/2006, 10/18/2007, 10/19/2008, 10/25/2009  . Influenza,inj,Quad PF,36+ Mos 09/17/2012, 09/19/2013, 09/13/2014  . Influenza-Unspecified 08/31/2015  . Pneumococcal Polysaccharide-23 03/10/2002, 02/08/2009  . Td 03/10/2002, 08/08/2014  . Zoster 08/08/2014   There are no preventive care reminders to display for this patient.  Patient Care Team: [name], DO as PCP - General (Family Medicine) Standley Brooking, RN as [place] any recent [place] you may have received from other than [facility] providers in the past year (date may be approximate).     Assessment:   This is a routine wellness examination for Linda Thompson.  Heart Healthy Diet; less sugar BP up mod today but states it is 120/80 at home.  BP Education: with goal < 140/80     Hearing/Vision screen  Hearing Screening   125Hz  250Hz  500Hz  1000Hz  2000Hz  3000Hz  4000Hz  6000Hz  8000Hz   Right ear:     100      Left ear:     100      Vision Screening Comments: Regular vision test   Dietary issues and exercise activities discussed: Current Exercise Habits: Home exercise routine  Goals    . patient          Will discuss Maintenance with PT  Will discuss how to create safe toning exercise; sitter exercise and plan the time of day and what you will use  Work on the number of times you are exercising; and limits; time frames; 15 min or 30 min    You may want to practice steps       Depression Screen PHQ 2/9 Scores 12/11/2015 11/29/2015  PHQ -  2 Score 0 0    Fall Risk Fall Risk  12/11/2015 12/03/2015 11/29/2015  Falls in the past year? No No No    Cognitive Function: MMSE - Mini Mental State Exam 12/11/2015  Not completed: (No Data)        Screening Tests Health Maintenance  Topic Date Due  . FOOT EXAM  02/01/2016  . HEMOGLOBIN A1C  02/14/2016  . OPHTHALMOLOGY EXAM  05/28/2016  . URINE MICROALBUMIN  08/13/2016  . MAMMOGRAM  09/18/2016  . PAP SMEAR  01/16/2017  . COLONOSCOPY  04/18/2018  . TETANUS/TDAP  08/07/2024  . PNEUMOCOCCAL POLYSACCHARIDE VACCINE  Completed  . INFLUENZA VACCINE  Addressed  . ZOSTAVAX  Completed  . Hepatitis C Screening  Completed  . HIV Screening  Completed      Plan:    Review of screenings completed and plan for completion individualized: Education and counseling performed based on assessed risks today. Patient provided with a copy of personalized plan for preventive services.    Referrals:  Was to fup with Dr. Arsenio Loader to discuss iron and recheck HCPOA will schedule this in the near future  The patient agrees to: work with PT and develop a home exercise plan; will discuss success or barriers with Dr. Maudie Mercury next visit in one month.   Advanced directive: is completed and HCPOA is with her tdya  Abnormal screens Oriented x 5; cooks simple meals; would like to drive but for now, held back due to pain in back;  Could not count serial 7's but HCPOA states Administrative poa is an attorney and manges her fnances  recall was 2 of 3;  No failures of independent living in an apt    Discuss with Doctor on next [id number] month  No new issues since last MD visit   Commit to Goals set;    During the course of the visit, Linda Thompson was educated and counseled about the following appropriate screening and preventive services:   Vaccines to include Pneumoccal, Influenza, Hepatitis B, Td,  Zostavax, HCV  Electrocardiogram  Cardiovascular disease screening  Colorectal cancer screening  Bone density screening  Diabetes screening  Glaucoma screening  Mammography/PAP  Nutrition counseling  Smoking cessation counseling  Patient Instructions (the written plan) were given to the patient.    Linda Fines, RN   12/11/2015

## 2015-12-11 NOTE — Progress Notes (Signed)
Thompson, Linda R., DO  

## 2015-12-11 NOTE — ED Provider Notes (Signed)
[facility] DEPT Provider Note    By signing my name below, I, Bea Graff, attest that this documentation has been prepared under the direction and in the presence of Etta Quill, [place]. Electronically Signed: Bea Graff, ED Scribe. 12/11/15. 2:20 PM.   History   Chief Complaint Chief Complaint  Patient presents with  . Fall   The history is provided by the patient and medical records. No language interpreter was used.    HPI Comments:  Linda Thompson is a 60 y.o. female brought in by EMS who presents to the Emergency Department complaining of a fall that occurred PTA. She states she got out of her vehicle and her left leg gave out. Pt reports she has been using a new cane for about three days and is not used to it yet and this may have contributed to her fall. She reports left knee and left elbow abrasion. She reports left ankle pain. She has not taken anything for pain. Bearing weight increases the pain. She denies alleviating factors. She denies head trauma, LOC, nausea, vomiting, numbness, tingling or weakness of any extremity, back pain or neck pain.    Past Medical History:  Diagnosis Date  . Allergic rhinitis   . Allergy   . Anemia   . Chronic low back pain   . DDD (degenerative disc disease), lumbar   . GERD (gastroesophageal reflux disease)    w/ hx diaphragmatic hernia  . Glaucoma   . GLAUCOMA NOS 03/11/2006   Qualifier: Diagnosis of  By: Diona Browner MD, Amy    . Heart murmur   . Hx of fracture    R arm, foot  . Hyperlipemia   . Hypertension   . IBS (irritable bowel syndrome)   . Low back pain 11/22/2012  . Meningioma ([place])    ant falx, stable on prior imaging  . Moderate intellectual disabilities   . Murmur   . Obesity   . Osteoarthrosis, unspecified whether generalized or localized, ankle and foot   . Sarcoidosis ([place])    difuse bony lesions and LDA, dx by biopsy in 2016  . Transaminitis   . Type II or unspecified type diabetes mellitus without  mention of complication, not stated as uncontrolled     Patient Active Problem List   Diagnosis Date Noted  . Arthritis 12/03/2015  . Sarcoidosis ([place]) 10/27/2014  . Obesity, morbid ([place]) 05/05/2011  . Irritable bowel syndrome 03/05/2010  . Diabetes ([place]) 08/13/2007  . ALLERGIC  RHINITIS 09/23/2006  . Hyperlipemia 03/11/2006  . MENTAL RETARDATION, MODERATE 03/11/2006  . GLAUCOMA NOS 03/11/2006  . Essential hypertension 03/11/2006  . GERD 03/11/2006  . MENINGIOMA 12/20/2001    Past Surgical History:  Procedure Laterality Date  . CATARACT EXTRACTION    . COLONOSCOPY  04/15/2011  . ELBOW SURGERY     right elbow  . LIPOMA EXCISION Left [id number]   posterior left axilla  . TOTAL ABDOMINAL HYSTERECTOMY  03/05/1992   leiomyoma and cellular leiomyoma    OB History    Gravida Para Term Preterm AB Living   0 0 0 0 0 0   SAB TAB Ectopic Multiple Live Births   0 0 0 0         Home Medications    Prior to Admission medications   Medication Sig Start Date End Date Taking? Authorizing Provider  acetaminophen (TYLENOL) 500 MG tablet Take 500 mg by mouth as needed (left ear pain). Reported on 04/18/2015    Historical Provider, MD  diltiazem (CARDIZEM CD) 240 MG 24 hr capsule TAKE ONE CAPSULE BY MOUTH EVERY DAY 06/21/15   [name], DO  ferrous sulfate 325 (65 FE) MG tablet TAKE 1 TABLET (325 MG TOTAL) BY MOUTH 2 (TWO) TIMES DAILY WITH A MEAL. 12/04/15   Manus Gunning, MD  hydrochlorothiazide (MICROZIDE) 12.5 MG capsule TAKE 1 CAPSULE BY MOUTH ONCE DAILY 08/27/15   [name], DO  hydrocortisone 2.5 % ointment APPLY TO AFFECTED AREA TWICE A DAY 05/01/15   Historical Provider, MD  ipratropium (ATROVENT) 0.06 % nasal spray PLACE 2 SPRAYS INTO BOTH NOSTRILS 4 (FOUR) TIMES DAILY. 06/19/15   [name], DO  ketoconazole (NIZORAL) 2 % shampoo APPLY TO AFFECTED AREA TWICE A WEEK AS DIRECTED 10/01/15   [name], DO  Lactobacillus [facility] ACIDOPHILUS PO) Take 1 tablet by mouth  daily.    Historical Provider, MD  latanoprost (XALATAN) 0.005 % ophthalmic solution Place 1 drop into both eyes at bedtime.  04/10/11   Historical Provider, MD  liver oil-zinc oxide (DESITIN) 40 % ointment Apply 1 application topically as needed for irritation.    Historical Provider, MD  loperamide (IMODIUM) 2 MG capsule Take 2 mg by mouth as needed for diarrhea or loose stools (PRN diarrhea).    Historical Provider, MD  loratadine (CLARITIN) 10 MG tablet Take 10 mg by mouth daily.    Historical Provider, MD  metFORMIN (GLUCOPHAGE) 500 MG tablet TAKE 1 TABLET BY MOUTH TWICE A DAY WITH A MEAL 04/23/15   [name], DO  omeprazole (PRILOSEC) 20 MG capsule TAKE 1 CAPSULE (20 MG TOTAL) BY MOUTH 2 (TWO) TIMES DAILY. 04/23/15   [name], DO  potassium chloride SA (KLOR-CON M20) 20 MEQ tablet Take 1 tablet (20 mEq total) by mouth daily. 09/18/15   [name], DO  pravastatin (PRAVACHOL) 40 MG tablet TAKE 1 TABLET BY MOUTH EVERY EVENING 11/02/15   [name], DO  Propylene Glycol (SYSTANE BALANCE) 0.6 % SOLN     Historical Provider, MD  traMADol (ULTRAM) 50 MG tablet Take 1 tablet (50 mg total) by mouth daily. 11/20/15   Marybelle Killings, MD    Family History Family History  Problem Relation Age of Onset  . Diabetes Mother   . Stroke Mother   . Hypertension Mother   . Stroke Father   . Diabetes Maternal Aunt   . Heart disease Maternal Aunt      (had pacemaker)  . Diabetes Maternal Grandmother   . Stroke Maternal Grandmother   . Hypertension Maternal Grandmother   . Colon cancer Neg Hx     Social History Social History  Substance Use Topics  . Smoking status: Never Smoker  . Smokeless tobacco: Never Used  . Alcohol use No     Allergies   Ace inhibitors; Amoxicillin-pot clavulanate; Augmentin [amoxicillin-pot clavulanate]; Azithromycin; Ceftin [cefuroxime axetil]; Cefuroxime; Cyclobenzaprine hcl; Flexeril [cyclobenzaprine]; Flonase [fluticasone propionate]; Fluticasone; and  Ibuprofen   Review of Systems Review of Systems  Gastrointestinal: Negative for nausea and vomiting.  Musculoskeletal: Positive for arthralgias. Negative for back pain and neck pain.  Neurological: Negative for syncope, weakness and numbness.  All other systems reviewed and are negative.    Physical Exam Updated Vital Signs LMP 01/21/1992 (Approximate)   Physical Exam  Constitutional: She is oriented to person, place, and time. She appears well-developed and well-nourished.  HENT:  Head: Normocephalic and atraumatic.  Neck: Normal range of motion.  Cardiovascular: Normal rate.  Pulmonary/Chest: Effort normal.  Musculoskeletal: Normal range of motion. She exhibits tenderness. She exhibits no edema or deformity.  Superficial abrasion to left knee and left elbow. Lateral distal malleolar tenderness.  Neurological: She is alert and oriented to person, place, and time.  Skin: Skin is warm and dry.  Psychiatric: She has a normal mood and affect. Her behavior is normal.  Nursing note and vitals reviewed.    ED Treatments / Results  DIAGNOSTIC STUDIES:    COORDINATION OF CARE: 1:19 PM- Will X-Ray left ankle. Pt verbalizes understanding and agrees to plan.  Medications - No data to display  Labs (all labs ordered are listed, but only abnormal results are displayed) Labs Reviewed - No data to display  EKG  EKG Interpretation None       Radiology Dg Ankle Complete Left  Result Date: 12/11/2015 CLINICAL DATA:  Left ankle pain after fall today. EXAM: LEFT ANKLE COMPLETE - 3+ VIEW COMPARISON:  None. FINDINGS: There is no evidence of fracture, dislocation, or joint effusion. There is no evidence of arthropathy or other focal bone abnormality. Soft tissues are unremarkable. IMPRESSION: Normal left ankle. Electronically Signed   By: Marijo Conception, M.D.   On: 12/11/2015 14:02    Procedures Procedures (including critical care time)  Medications Ordered in ED Medications  - No data to display   Initial Impression / Assessment and Plan / ED Course  I have reviewed the triage vital signs and the nursing notes.  Pertinent labs & imaging results that were available during my care of the patient were reviewed by me and considered in my medical decision making (see chart for details).  Clinical Course     Patient X-Ray negative for obvious fracture or dislocation.  Pt advised to follow up with orthopedics. Patient given ASO splint while in ED, conservative therapy recommended and discussed. Patient will be discharged home & is agreeable with above plan. Returns precautions discussed. Pt appears safe for discharge.   I personally performed the services described in this documentation, which was scribed in my presence. The recorded information has been reviewed and is accurate.  Final Clinical Impressions(s) / ED Diagnoses   Final diagnoses:  Acute left ankle pain    New Prescriptions Discharge Medication List as of 12/11/2015  2:53 PM       Etta Quill, NP 12/11/15 1533    Lacretia Leigh, MD 12/12/15 1023

## 2015-12-11 NOTE — Progress Notes (Signed)
Orthopedic Tech Progress Note Patient Details:  Linda Thompson Dec 26, 1955 [id number]  Ortho Devices Type of Ortho Device: ASO Ortho Device/Splint Location: Lt Ankle Ortho Device/Splint Interventions: Application   [name] 12/11/2015, 2:54 PM

## 2015-12-11 NOTE — ED Notes (Signed)
Ortho paged to apply splint.

## 2015-12-12 DIAGNOSIS — M6281 Muscle weakness (generalized): Secondary | ICD-10-CM | POA: Diagnosis not present

## 2015-12-12 DIAGNOSIS — F71 Moderate intellectual disabilities: Secondary | ICD-10-CM | POA: Diagnosis not present

## 2015-12-12 DIAGNOSIS — M5136 Other intervertebral disc degeneration, lumbar region: Secondary | ICD-10-CM | POA: Diagnosis not present

## 2015-12-12 DIAGNOSIS — H409 Unspecified glaucoma: Secondary | ICD-10-CM | POA: Diagnosis not present

## 2015-12-12 DIAGNOSIS — I10 Essential (primary) hypertension: Secondary | ICD-10-CM | POA: Diagnosis not present

## 2015-12-12 DIAGNOSIS — E119 Type 2 diabetes mellitus without complications: Secondary | ICD-10-CM | POA: Diagnosis not present

## 2015-12-18 DIAGNOSIS — H409 Unspecified glaucoma: Secondary | ICD-10-CM | POA: Diagnosis not present

## 2015-12-18 DIAGNOSIS — F71 Moderate intellectual disabilities: Secondary | ICD-10-CM | POA: Diagnosis not present

## 2015-12-18 DIAGNOSIS — M5136 Other intervertebral disc degeneration, lumbar region: Secondary | ICD-10-CM | POA: Diagnosis not present

## 2015-12-18 DIAGNOSIS — E119 Type 2 diabetes mellitus without complications: Secondary | ICD-10-CM | POA: Diagnosis not present

## 2015-12-18 DIAGNOSIS — I10 Essential (primary) hypertension: Secondary | ICD-10-CM | POA: Diagnosis not present

## 2015-12-18 DIAGNOSIS — M6281 Muscle weakness (generalized): Secondary | ICD-10-CM | POA: Diagnosis not present

## 2015-12-20 DIAGNOSIS — M5136 Other intervertebral disc degeneration, lumbar region: Secondary | ICD-10-CM | POA: Diagnosis not present

## 2015-12-20 DIAGNOSIS — M6281 Muscle weakness (generalized): Secondary | ICD-10-CM | POA: Diagnosis not present

## 2015-12-20 DIAGNOSIS — H409 Unspecified glaucoma: Secondary | ICD-10-CM | POA: Diagnosis not present

## 2015-12-20 DIAGNOSIS — E119 Type 2 diabetes mellitus without complications: Secondary | ICD-10-CM | POA: Diagnosis not present

## 2015-12-20 DIAGNOSIS — F71 Moderate intellectual disabilities: Secondary | ICD-10-CM | POA: Diagnosis not present

## 2015-12-20 DIAGNOSIS — I10 Essential (primary) hypertension: Secondary | ICD-10-CM | POA: Diagnosis not present

## 2015-12-24 ENCOUNTER — Telehealth: Payer: Self-pay | Admitting: Family Medicine

## 2015-12-24 DIAGNOSIS — E119 Type 2 diabetes mellitus without complications: Secondary | ICD-10-CM | POA: Diagnosis not present

## 2015-12-24 DIAGNOSIS — M6281 Muscle weakness (generalized): Secondary | ICD-10-CM | POA: Diagnosis not present

## 2015-12-24 DIAGNOSIS — I10 Essential (primary) hypertension: Secondary | ICD-10-CM | POA: Diagnosis not present

## 2015-12-24 DIAGNOSIS — F71 Moderate intellectual disabilities: Secondary | ICD-10-CM | POA: Diagnosis not present

## 2015-12-24 DIAGNOSIS — M5136 Other intervertebral disc degeneration, lumbar region: Secondary | ICD-10-CM | POA: Diagnosis not present

## 2015-12-24 DIAGNOSIS — H409 Unspecified glaucoma: Secondary | ICD-10-CM | POA: Diagnosis not present

## 2015-12-24 NOTE — Telephone Encounter (Signed)
FYI Pt had a fall on 12/11/15 pt was taking to er and has sprain ankle. Pt xray was normal. Linda Thompson is requesting a order for rollator fax order to [facility] [phone]

## 2015-12-24 NOTE — Telephone Encounter (Signed)
rx on your desk.

## 2015-12-24 NOTE — Telephone Encounter (Signed)
Rx faxed to [facility] at [phone] attn: [place].

## 2015-12-25 ENCOUNTER — Other Ambulatory Visit: Payer: Self-pay

## 2015-12-25 ENCOUNTER — Other Ambulatory Visit (INDEPENDENT_AMBULATORY_CARE_PROVIDER_SITE_OTHER): Payer: Medicare Other

## 2015-12-25 DIAGNOSIS — Z9181 History of falling: Secondary | ICD-10-CM

## 2015-12-25 DIAGNOSIS — K317 Polyp of stomach and duodenum: Secondary | ICD-10-CM | POA: Diagnosis not present

## 2015-12-25 LAB — CBC WITH DIFFERENTIAL/PLATELET
BASOS ABS: 0 10*3/uL (ref 0.0–0.1)
BASOS PCT: 0.5 % (ref 0.0–3.0)
EOS PCT: 1 % (ref 0.0–5.0)
Eosinophils Absolute: 0.1 10*3/uL (ref 0.0–0.7)
HEMATOCRIT: 45.9 % (ref 36.0–46.0)
Hemoglobin: 15.4 g/dL — ABNORMAL HIGH (ref 12.0–15.0)
LYMPHS ABS: 1.1 10*3/uL (ref 0.7–4.0)
LYMPHS PCT: 16.7 % (ref 12.0–46.0)
MCHC: 33.7 g/dL (ref 30.0–36.0)
MCV: 86 fl (ref 78.0–100.0)
MONOS PCT: 9 % (ref 3.0–12.0)
Monocytes Absolute: 0.6 10*3/uL (ref 0.1–1.0)
NEUTROS ABS: 4.9 10*3/uL (ref 1.4–7.7)
NEUTROS PCT: 72.8 % (ref 43.0–77.0)
PLATELETS: 348 10*3/uL (ref 150.0–400.0)
RBC: 5.33 Mil/uL — ABNORMAL HIGH (ref 3.87–5.11)
RDW: 14.3 % (ref 11.5–15.5)
WBC: 6.7 10*3/uL (ref 4.0–10.5)

## 2015-12-25 LAB — IRON AND TIBC
%SAT: 29 % (ref 11–50)
Iron: 103 ug/dL (ref 45–160)
TIBC: 350 ug/dL (ref 250–450)
UIBC: 247 ug/dL (ref 125–400)

## 2015-12-25 LAB — FERRITIN: Ferritin: 60.8 ng/mL (ref 10.0–291.0)

## 2015-12-25 NOTE — Patient Outreach (Signed)
[facility]) Care Management  12/25/2015  JYAH BONNET 12-Jun-1955 [id number]   Inbound message received from Venetia Constable with questions regarding [facility] appointment with patient.  Outreach call to contact; not reached at cell or home #.   RN CM left HIPAA compliant voice message at both numbers.   RN CM scheduled for next outreach call within one week.  (Patient has Telephonic Assessment scheduled this week).      Nathaneil Canary, BSN, RN, [place] Management Care Management Coordinator [phone] Direct [phone] Cell [phone] Office [phone] Fax [email or web address]

## 2015-12-25 NOTE — Patient Outreach (Signed)
[facility]) Care Management  12/25/2015  TIEKA MANTOVANI 1956/01/05 [id number]   Telephonic Monthly Assessment   Referral Date:  11/27/2015 Insurance:  Medicare  H/o Screening and Initial Assessment 11/29/2015.   H/o high utilization of services:  H/o patient scheduling appointment for minor concerns.  THN to act as liaison between patient at home and scheduling appointments.   Patient prefers to see her MD because she understands better when having a face to face visit  Subjective:   [facility] [place] # [id number] [place], [place] [id number] ED visit 12/11/15 - fall - H/o patient fell getting out of car.  X-ray negative.  Advised to follow-up with Orthopedics and ASO splint applied while in ED.  Rolator ordered and should be delivered tomorrow.  HH:   PT services active with [facility]. Patient has not followed-up with Orthopedist yet.  States h/o last appt with Dr. Lorin Mercy one year ago and prefers to see Dr. Lorin Mercy again.  Patient states she will call and schedule this appt.     Providers: [facility] Primary MD: Dr. [name], DO  -  last appt: 10/27/15 and next appt 12/18 (Diabetes follow-up) Podiatrist:  Dr. Gardiner Barefoot - last appt 11/28/15 - next appt 3 months Gynecologist: Dr. Hale Bogus and Melvia Heaps, CNM - last appt -  11/26/15 Orthopedist: Dr. Lorin Mercy - last appt 12/2014 -   HH:  PT - [facility]  # [phone]   Psycho/Social: Patient with moderate mental retardation and  lives in [place] apartment alone.  Parents are deceased.  Patient has HCPOA and attorney who manages her care.  Mobility:  Patient C/O weakness Left Leg and giving away.   Falls: (1)  12/11/15 Pain: arthritis Left hip. Rolator ordered and h/o order faxed to [facility] [phone]  Attn:  Betsy on 12/24/2015.  Delivery scheduled for 12/26/15.  Depression: none Transportation:  Patient not driving since fall on 12/11/15 Caregiver:  Parents:  deceased.  [name] [id number] POA:  Brien Mates Ph: [phone] / Fax: [phone] takes care of paying all of patients bills.  Irven Coe P.O. [place], [place] [address]  Patient states she receives regular calls from her [place] office to check on her.  Advance Directive: states Ms [name] would make decisions.  Consent:  yes DME:  eyeglasses, partial lower dental plate.   Co-morbidities:  Diabetes Type 2, Essential hypertension, GERD, IBS, Meningioma, Mental Retardation (Moderate); Glaucoma, Obesity, morbid; Sarcoidosis Admissions: 0 ER visits: 1  Hemorrhoid, internal with perirectal irritation Patient reports improvement in itching since bathing more often.  States she is taking a shower everyday.   BP 110/82 12/11/2015 Weight 188 lb (85 kg) 12/11/2015  ( Current Weight down 3 lbs (from 191) Height 62 in (157 cm) 12/11/2015 BMI 34.50 (Obese Class I) 12/11/2015  Lipid Panel completed 12/12/2013 HDL 28.200 12/12/2013 LDL 78.800 12/12/2013 Cholesterol, total 141.000 12/12/2013 Triglycerides 266.000 12/12/2013 A1C 6.700 08/14/2015 Glucose Random 119.000 02/01/2015  Medications:  Patient taking more less than 15 medications  Co-pay cost issues: none  Prevnar (PCV13) N/D Pneumovax (PP 02/08/2009 Flu Vaccine 08/31/2015 tDAP Vaccine 08/08/2014 [facility] herself  Encounter Medications:  Outpatient Encounter Prescriptions as of 12/25/2015  Medication Sig  . acetaminophen (TYLENOL) 500 MG tablet Take 500 mg by mouth as needed (left ear pain). Reported on 04/18/2015  . diltiazem (CARDIZEM CD) 240 MG 24 hr capsule TAKE ONE  CAPSULE BY MOUTH EVERY DAY  . ferrous sulfate 325 (65 FE) MG tablet TAKE 1 TABLET (325 MG TOTAL) BY MOUTH 2 (TWO) TIMES DAILY WITH A MEAL.  . hydrochlorothiazide (MICROZIDE) 12.5 MG capsule TAKE 1 CAPSULE BY MOUTH ONCE DAILY  . hydrocortisone 2.5 % ointment APPLY TO AFFECTED AREA TWICE A  DAY  . ipratropium (ATROVENT) 0.06 % nasal spray PLACE 2 SPRAYS INTO BOTH NOSTRILS 4 (FOUR) TIMES DAILY.  Marland Kitchen ketoconazole (NIZORAL) 2 % shampoo APPLY TO AFFECTED AREA TWICE A WEEK AS DIRECTED  . Lactobacillus (FLORAJEN ACIDOPHILUS PO) Take 1 tablet by mouth daily.  Marland Kitchen latanoprost (XALATAN) 0.005 % ophthalmic solution Place 1 drop into both eyes at bedtime.   Marland Kitchen liver oil-zinc oxide (DESITIN) 40 % ointment Apply 1 application topically as needed for irritation.  Marland Kitchen loperamide (IMODIUM) 2 MG capsule Take 2 mg by mouth as needed for diarrhea or loose stools (PRN diarrhea).  . loratadine (CLARITIN) 10 MG tablet Take 10 mg by mouth daily.  . metFORMIN (GLUCOPHAGE) 500 MG tablet TAKE 1 TABLET BY MOUTH TWICE A DAY WITH A MEAL  . omeprazole (PRILOSEC) 20 MG capsule TAKE 1 CAPSULE (20 MG TOTAL) BY MOUTH 2 (TWO) TIMES DAILY.  Marland Kitchen potassium chloride SA (KLOR-CON M20) 20 MEQ tablet Take 1 tablet (20 mEq total) by mouth daily.  . pravastatin (PRAVACHOL) 40 MG tablet TAKE 1 TABLET BY MOUTH EVERY EVENING  . Propylene Glycol (SYSTANE BALANCE) 0.6 % SOLN   . traMADol (ULTRAM) 50 MG tablet Take 1 tablet (50 mg total) by mouth daily.   No facility-administered encounter medications on file as of 12/25/2015.     Functional Status:  In your present state of health, do you have any difficulty performing the following activities: 12/25/2015 12/11/2015  Hearing? N N  Vision? N N  Difficulty concentrating or making decisions? N N  Walking or climbing stairs? Y N  Dressing or bathing? N N  Doing errands, shopping? Y N  Preparing Food and eating ? - N  Using the Toilet? - N  In the past six months, have you accidently leaked urine? - N  Do you have problems with loss of bowel control? - N  Managing your Medications? - N  Managing your Finances? - N  Housekeeping or managing your Housekeeping? - -  Some recent data might be hidden    Fall/Depression Screening: PHQ 2/9 Scores 12/25/2015 12/11/2015 11/29/2015  PHQ - 2  Score 0 0 0    Fall Risk  12/25/2015 12/11/2015 12/03/2015 11/29/2015  Falls in the past year? Yes No No No  Number falls in past yr: 1 - - -  Injury with Fall? Yes - - -  Risk Factor Category  High Fall Risk - - -  Risk for fall due to : History of fall(s);Impaired balance/gait;Impaired mobility - - -  Follow up Falls evaluation completed;Education provided;Falls prevention discussed - - -    Preventives: Hearing: no issues  Eyes: Dr. 10/2015  Dentist: no issues and has not seen in a long time.  Bone Density: unknown Colonoscopy 04/18/2015 Mammogram 09/19/2015 Endo - DM Eye Exam: positive retin 05/29/2015 Endo - DM Foot Exam 02/01/2015  Plan:  Referral Date:  11/27/2015 Screening and Initial Assessment 11/29/2015 Telephonic RN CM 11/29/2015 Program:  DM 11/29/2015  Recent fall and high risk for falls associated to weakness Left leg as evidenced by  ED visit 12/11/15 -  H/o patient fell getting out of car.   Fall prevention discussed.  RN  CM encouraged to use Rolator at all times.  Continue to use current walker until Rolator arrives.  Continue with PT services Schedule Ortho appt to assess ED findings and left Hip pain.   High utilization of provider services RN CM discussed ED recommendation:  Follow-up with Orthopedics.  RN CM encouraged patient to schedule this appointment.    Hemorrhoid, internal with perirectal irritation Discussed outcome of more frequent bathing.  Patient confirms less itching with improved bathing.   Diabetes RN CM will provide ongoing education and management and assess for resolution to possible complications of diabetes such as yeast.   RN CM advised in next [facility] scheduled contact call within next 30 days for monthly assessment and care coordination services as needed. RN CM advised to please notify MD of any changes in condition prior to scheduled appt's.   RN CM provided contact name and # [phone] or main office # [phone] and 24-hour nurse  line # [phone].  RN CM confirmed patient is aware of 911 services for urgent emergency needs.  Nathaneil Canary, BSN, RN, [place] Care Management Care Management Coordinator [phone] Direct [phone] Cell [phone] Office [phone] Fax [email or web address]

## 2015-12-26 ENCOUNTER — Ambulatory Visit (INDEPENDENT_AMBULATORY_CARE_PROVIDER_SITE_OTHER): Payer: Medicare Other | Admitting: Orthopaedic Surgery

## 2015-12-26 DIAGNOSIS — M5136 Other intervertebral disc degeneration, lumbar region: Secondary | ICD-10-CM | POA: Diagnosis not present

## 2015-12-26 DIAGNOSIS — M6281 Muscle weakness (generalized): Secondary | ICD-10-CM | POA: Diagnosis not present

## 2015-12-26 DIAGNOSIS — I10 Essential (primary) hypertension: Secondary | ICD-10-CM | POA: Diagnosis not present

## 2015-12-26 DIAGNOSIS — F71 Moderate intellectual disabilities: Secondary | ICD-10-CM | POA: Diagnosis not present

## 2015-12-26 DIAGNOSIS — H409 Unspecified glaucoma: Secondary | ICD-10-CM | POA: Diagnosis not present

## 2015-12-26 DIAGNOSIS — E119 Type 2 diabetes mellitus without complications: Secondary | ICD-10-CM | POA: Diagnosis not present

## 2015-12-27 ENCOUNTER — Ambulatory Visit: Payer: Medicare Other

## 2015-12-27 ENCOUNTER — Other Ambulatory Visit: Payer: Self-pay

## 2015-12-27 NOTE — Patient Outreach (Signed)
[facility]) Care Management  12/27/2015  SHERRIN LANO [birth year] [id number]  Inbound call received from [facility], [name] to discuss issues of concern.  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  States patient has had Six calls to EMS over the  last couple of weeks; Leg pain, Fall and needed help to get up out of floor, fall getting out of the car using a 4 prong cane. States patient uses the excuse of leg pain for not walking and exercising.   Patient currently active with Home PT services twice a week with [facility]  HCPOA states patient can push a shopping cart in grocery store very quickly without any complaints but when patient returns back to the facility patient walks with walker very slowly and thinks patient may do this to get attention.  Patient recently requested a Rolator.  States h/o past injections into back for pain management about 3 years ago with Dr. Lorin Mercy.   States patient does not bath and does not eat correctly.  States patient will not wash her hair.  States patient is wearing depends due to  fear of having an accident and smells very badly.  HCPOA thinks patient is reusing her Depends and her hairdresser has reported she has a very bad odor.  Buys whatever is on sale in numerous quantities. States patient waist money by buying things she does not need. States patient received a recent visit from Mrs. Blue, [employer] Social [employer].      Medications: States [facility] and likes to keep a 3 month supply.  States patient has three bottles of eye drops right now.  States patient wants to keep a 3 months' supply.   [facility]CPOA [facility] this [facility] but states issues with patient filling her own medications.      Assessment / Plan: RN CM discussed option of patient may need to be in assisted living or LTC supervised care rather  than current independent living.  Advised a more controlled environment would provide supportive services to insure patient is getting a bath, getting meds correcting and increased supervision.   RN CM discussed options of socialization (HCPOA states patient gets board with [employer] unless she is winning).   RN CM discussed resource, PACE.  HCOPA is interested in increased level of care to meet patient needs and further PACE information; agreed to PACE Referral. RN CM encouraged HCPOA to allow patient to follow-up with Ortho MD, Dr. Lorin Mercy to further assess and rule out any issues in hip or back contributing to hip pain as patient has described as her leg giving out prior to the falls.   [facility] Referral  [facility] Referral  -Patient currently in ILF but needs higher level of care to meet needs.   PACE Referral sent  Mariann Laster, MSHL, BSN, RN, [place] Management Care Management Coordinator [phone] Direct [phone] Cell [phone] Office [phone] Fax [email or web address]

## 2015-12-31 ENCOUNTER — Encounter: Payer: Self-pay | Admitting: Licensed Clinical Social Worker

## 2015-12-31 ENCOUNTER — Other Ambulatory Visit: Payer: Self-pay | Admitting: Licensed Clinical Social Worker

## 2015-12-31 DIAGNOSIS — H409 Unspecified glaucoma: Secondary | ICD-10-CM | POA: Diagnosis not present

## 2015-12-31 DIAGNOSIS — E119 Type 2 diabetes mellitus without complications: Secondary | ICD-10-CM | POA: Diagnosis not present

## 2015-12-31 DIAGNOSIS — M6281 Muscle weakness (generalized): Secondary | ICD-10-CM | POA: Diagnosis not present

## 2015-12-31 DIAGNOSIS — I10 Essential (primary) hypertension: Secondary | ICD-10-CM | POA: Diagnosis not present

## 2015-12-31 DIAGNOSIS — M5136 Other intervertebral disc degeneration, lumbar region: Secondary | ICD-10-CM | POA: Diagnosis not present

## 2015-12-31 DIAGNOSIS — F71 Moderate intellectual disabilities: Secondary | ICD-10-CM | POA: Diagnosis not present

## 2015-12-31 NOTE — Patient Outreach (Signed)
[facility]) Care Management  12/31/2015  XITLALY HANDLER Feb 08, 1955 [id number]   Assessment- CSW completed initial outreach to [facility] after receiving referral on 12/31/15. Patient and patient's HCPOA are interested in the PACE program and additional resources. CSW spoke to [facility] spouse who stated that she is not available at this time. CSW provided contact number for her to return call.  Plan-CSW will await return call within one week.  [name], BSW, MSW, [email or web address] Phone: [phone] Fax: [phone]

## 2016-01-01 ENCOUNTER — Other Ambulatory Visit: Payer: Self-pay | Admitting: Licensed Clinical Social Worker

## 2016-01-01 NOTE — Patient Outreach (Signed)
[facility]) Care Management  01/01/2016  Linda Thompson 14-Nov-1955 [id number]  Assessment- CSW completed call to both patient and her [name]. Patient answered and CSW introduced self, reason for call and of [place] social work services. Patient is agreeable. Patient is agreeable to [facility] home visit for 01/03/16 and wishes for her HCPOA to be present. HIPPA verifications were provided. CSW completed call to [name] and spoke to her. [name] is agreeable to joining home visit on 01/03/16. She states that she wishes CSW to know ahead of time that "patient often does things for attention." Patient lives in an independent living community. [name] went on to say that patient will state that she does not like going to the hospital but will go there in order to get attention in her opinion. She states that she exaggerates at times. [name] reports that she cares for patient deeply and that patient's support network may enable patient at times due to habit. She reports that patient has hygiene problems and will not bath. [name] states that patient will go to the grocery store and buy large quantities of food for fear that she will run out. [name] reports that patient is not agreeable to LTC placement but is interested in more care in the home or day programs.  Plan-CSW will complete initial home visit this week.  [name], BSW, MSW, [name] Phone: [phone] Fax: [phone]

## 2016-01-02 DIAGNOSIS — H409 Unspecified glaucoma: Secondary | ICD-10-CM | POA: Diagnosis not present

## 2016-01-02 DIAGNOSIS — M5136 Other intervertebral disc degeneration, lumbar region: Secondary | ICD-10-CM | POA: Diagnosis not present

## 2016-01-02 DIAGNOSIS — F71 Moderate intellectual disabilities: Secondary | ICD-10-CM | POA: Diagnosis not present

## 2016-01-02 DIAGNOSIS — M6281 Muscle weakness (generalized): Secondary | ICD-10-CM | POA: Diagnosis not present

## 2016-01-02 DIAGNOSIS — I10 Essential (primary) hypertension: Secondary | ICD-10-CM | POA: Diagnosis not present

## 2016-01-02 DIAGNOSIS — E119 Type 2 diabetes mellitus without complications: Secondary | ICD-10-CM | POA: Diagnosis not present

## 2016-01-03 ENCOUNTER — Other Ambulatory Visit: Payer: Self-pay | Admitting: Licensed Clinical Social Worker

## 2016-01-03 NOTE — Patient Outreach (Signed)
[facility]) Care Management  [facility] Social Work  01/03/2016  Linda Thompson [birth year] [id number]  Encounter Medications:  Outpatient Encounter Prescriptions as of 01/03/2016  Medication Sig  . acetaminophen (TYLENOL) 500 MG tablet Take 500 mg by mouth as needed (left ear pain). Reported on 04/18/2015  . diltiazem (CARDIZEM CD) 240 MG 24 hr capsule TAKE ONE CAPSULE BY MOUTH EVERY DAY  . ferrous sulfate 325 (65 FE) MG tablet TAKE 1 TABLET (325 MG TOTAL) BY MOUTH 2 (TWO) TIMES DAILY WITH A MEAL.  . hydrochlorothiazide (MICROZIDE) 12.5 MG capsule TAKE 1 CAPSULE BY MOUTH ONCE DAILY  . hydrocortisone 2.5 % ointment APPLY TO AFFECTED AREA TWICE A DAY  . ipratropium (ATROVENT) 0.06 % nasal spray PLACE 2 SPRAYS INTO BOTH NOSTRILS 4 (FOUR) TIMES DAILY.  Marland Kitchen ketoconazole (NIZORAL) 2 % shampoo APPLY TO AFFECTED AREA TWICE A WEEK AS DIRECTED  . Lactobacillus (FLORAJEN ACIDOPHILUS PO) Take 1 tablet by mouth daily.  Marland Kitchen latanoprost (XALATAN) 0.005 % ophthalmic solution Place 1 drop into both eyes at bedtime.   Marland Kitchen liver oil-zinc oxide (DESITIN) 40 % ointment Apply 1 application topically as needed for irritation.  Marland Kitchen loperamide (IMODIUM) 2 MG capsule Take 2 mg by mouth as needed for diarrhea or loose stools (PRN diarrhea).  . loratadine (CLARITIN) 10 MG tablet Take 10 mg by mouth daily.  . metFORMIN (GLUCOPHAGE) 500 MG tablet TAKE 1 TABLET BY MOUTH TWICE A DAY WITH A MEAL  . omeprazole (PRILOSEC) 20 MG capsule TAKE 1 CAPSULE (20 MG TOTAL) BY MOUTH 2 (TWO) TIMES DAILY.  Marland Kitchen potassium chloride SA (KLOR-CON M20) 20 MEQ tablet Take 1 tablet (20 mEq total) by mouth daily.  . pravastatin (PRAVACHOL) 40 MG tablet TAKE 1 TABLET BY MOUTH EVERY EVENING  . Propylene Glycol (SYSTANE BALANCE) 0.6 % SOLN   . traMADol (ULTRAM) 50 MG tablet Take 1 tablet (50 mg total) by mouth daily.   No facility-administered encounter medications on file as of 01/03/2016.     Functional Status:  In your present state  of health, do you have any difficulty performing the following activities: 12/25/2015 12/11/2015  Hearing? N N  Vision? N N  Difficulty concentrating or making decisions? N N  Walking or climbing stairs? Y N  Dressing or bathing? N N  Doing errands, shopping? Y N  Preparing Food and eating ? - N  Using the Toilet? - N  In the past six months, have you accidently leaked urine? - N  Do you have problems with loss of bowel control? - N  Managing your Medications? - N  Managing your Finances? - N  Housekeeping or managing your Housekeeping? - -  Some recent data might be hidden    Fall/Depression Screening:  PHQ 2/9 Scores 01/03/2016 12/25/2015 12/11/2015 11/29/2015  PHQ - 2 Score 0 0 0 0    Assessment: CSW completed initial home visit with patient and her HCPOA Linda Thompson on 01/03/16. Patient's support network feel that she is in need of increased support. She currently lives in an independent living facility but support network feel that she would benefit from an increased level of care. Patient's HCPOA was her teacher when she was in school and then they were neighbors for years and grew to be very close. Linda Knee assist her with cleaning, transportation, grocery shopping, taking her to get her medications, etc. Patient pays someone monthly to come to her residence to do deep cleaning. Patient had a fall on 12/11/15 which led to  ED visit. Family is interested in the PACE program. Patient is currently receiving physical therapy with [facility] 2x per week. Patient has a Clinical research associate, attorney [name] who handles all of her finances. Patient contacted lawyer's office daily with questions and was charged for each consultation and family and lawyer thought it would be best to arrange a check in call every Friday to prevent this. Patient's lawyer deposits money into her account every Monday for her to go run errands and take care of matters. However, patient or Linda Knee can Writer and request money for  needed funds if needed. Patient is not interested in a life alert system at this time. Patient has been known to take all medications as prescribed. However, patient recently took a pain medication that was expired and Linda Thompson assisted her with getting rid of medications in her residence that were expired. Patient is unable to state her monthly income but states that she has an extensive amount of assets that her father left her. Patient has land in her name, a car and a savings account. Patient is unable to state any other assets but states that "I have a lot. I just don't know what." CSW completed educated on long term Medicaid and ALF placement. Family feel that patient would not qualify for long term Medicaid and are not interested in placement but appreciative of handout information and education. Patient has stable transportation with [employer] and her HCPOA. She has never used [employer]. Family is not interested in [facility] or In Home [employer] referrals. Family was provided a list of private pay caregiver agencies. However, patient wishes to gain PACE at this time instead of any other the other resources that were provided today. CSW provided handouts and education on Senior Resources within [place], [place] and ACE. CSW completed call to PACE and confirmed that they received referral completed by [facility] RNCM. CSW left message for PACE to contact Linda Thompson instead of patient for initial call. Family appreciative of all resource information that was provided during session today.  Plan: CSW will route encounter to PCP and will follow up within 30 days to ensure PACE has made outreach to family.  [facility] CM Care Plan Problem One   Flowsheet Row Most Recent Value  Care Plan Problem One  Knowledge deficit in available community resources (personal care, LTC and day programs)  Role Documenting the Problem One  Clinical Social Worker  Care Plan for Problem One  Active  THN Long Term Goal (31-90 days)  Patient  will be educated on available community resources such as PACE, ACE, Private Pay caregiver, [place], In Home Aide Services and LTC placement within 30 days as evidenced by family desire  THN Long Term Goal Start Date  01/01/16  Interventions for Problem One Long Term Goal  CSW will complete home visit with patient and her HCPOA and will spent time educating them on community resources and make referrals as needed.  THN CM Short Term Goal #1 (0-30 days)  Family will receive call from PACE within 30 days in order to schedule assessment  THN CM Short Term Goal #1 Start Date  01/03/16  Interventions for Short Term Goal #1  CSW completed home visit and provided information on PACE. CSW completed call to PACE and confirmed that they received application and CSW left message with coordinator to make initial call to [facility]. CSW coordinate care with PACE as needed.     Dickie La, BSW, MSW, LCSW [facility] [employer]  Care Management [email or web address] Phone: [phone] Fax: [phone]

## 2016-01-07 ENCOUNTER — Ambulatory Visit: Payer: Medicare Other | Admitting: Family Medicine

## 2016-01-08 ENCOUNTER — Telehealth: Payer: Self-pay | Admitting: Family Medicine

## 2016-01-08 DIAGNOSIS — I10 Essential (primary) hypertension: Secondary | ICD-10-CM | POA: Diagnosis not present

## 2016-01-08 DIAGNOSIS — M5136 Other intervertebral disc degeneration, lumbar region: Secondary | ICD-10-CM | POA: Diagnosis not present

## 2016-01-08 DIAGNOSIS — H409 Unspecified glaucoma: Secondary | ICD-10-CM | POA: Diagnosis not present

## 2016-01-08 DIAGNOSIS — E119 Type 2 diabetes mellitus without complications: Secondary | ICD-10-CM | POA: Diagnosis not present

## 2016-01-08 DIAGNOSIS — F71 Moderate intellectual disabilities: Secondary | ICD-10-CM | POA: Diagnosis not present

## 2016-01-08 DIAGNOSIS — M6281 Muscle weakness (generalized): Secondary | ICD-10-CM | POA: Diagnosis not present

## 2016-01-08 NOTE — Telephone Encounter (Signed)
I called Betsy, informed her of the message below and gave verbal orders for PT.

## 2016-01-08 NOTE — Telephone Encounter (Signed)
Betsy with [facility] would like verbal orders to extend home health PT for 1 wk / 1 2 wk /1 1 wk / 1

## 2016-01-08 NOTE — Telephone Encounter (Signed)
Okay, but please have them send orders and document stating the indications and reasons for for extended need for home health PT. Thanks.

## 2016-01-09 ENCOUNTER — Other Ambulatory Visit: Payer: Self-pay | Admitting: Licensed Clinical Social Worker

## 2016-01-09 NOTE — Patient Outreach (Signed)
[facility]) Care Management  01/09/2016  Linda Thompson 07/28/1955 [id number]   Assessment- CSW received incoming call from [place]. She reports that she contacted patient's HCPOA last week to schedule assessment but [name] wished to discuss program this week instead due to loss in the family and being out of town. PACE coordinator states that she will be contacting [name] this week in order to schedule a tour of the PACE facility. She states that currently patient would be on the wait list until March or April 2018 if they were agreeable to program. CSW appreciative this update.  Plan-PACE has made initial outreach with patient's family. CSW will contact patient or patient's HCPOA within two-three weeks to follow up on PACE referral.  [name], BSW, MSW, [email or web address] Phone: [phone] Fax: [phone]

## 2016-01-11 ENCOUNTER — Other Ambulatory Visit: Payer: Self-pay | Admitting: Licensed Clinical Social Worker

## 2016-01-11 NOTE — Patient Outreach (Addendum)
[facility]) Care Management  01/11/2016  ANJELICA BERRES 1955-03-16 [id number]   Assessment- CSW completed call to [facility] and was able to speak to her. [name] states that she did receive call for PACE care team member and was educated on their program. However, she was informed that it would cost $4,000 dollars per month for patient and that patient is unable to afford this cost and no longer is interested in program. [name] states that she has stress fractured her foot and will need to wear walking boot and her husband has knee replacement surgery soon and they will not be able to provide as much care to patient during that time. [name] states that she contacted patient's lawyer and they are working on finding an aide with the resources CSW provided so that someone can assist her with meal prep and helping her bathe. [name] states that she was appreciative of resource information provided during home visit.   Plan-CSW will complete next outreach within 2-3 weeks.  [name], BSW, MSW, [email or web address] Phone: [phone] Fax: [phone]

## 2016-01-16 DIAGNOSIS — H409 Unspecified glaucoma: Secondary | ICD-10-CM | POA: Diagnosis not present

## 2016-01-16 DIAGNOSIS — I10 Essential (primary) hypertension: Secondary | ICD-10-CM | POA: Diagnosis not present

## 2016-01-16 DIAGNOSIS — E119 Type 2 diabetes mellitus without complications: Secondary | ICD-10-CM | POA: Diagnosis not present

## 2016-01-16 DIAGNOSIS — M5136 Other intervertebral disc degeneration, lumbar region: Secondary | ICD-10-CM | POA: Diagnosis not present

## 2016-01-16 DIAGNOSIS — M6281 Muscle weakness (generalized): Secondary | ICD-10-CM | POA: Diagnosis not present

## 2016-01-16 DIAGNOSIS — F71 Moderate intellectual disabilities: Secondary | ICD-10-CM | POA: Diagnosis not present

## 2016-01-22 ENCOUNTER — Encounter (INDEPENDENT_AMBULATORY_CARE_PROVIDER_SITE_OTHER): Payer: Self-pay | Admitting: Orthopaedic Surgery

## 2016-01-22 ENCOUNTER — Ambulatory Visit (INDEPENDENT_AMBULATORY_CARE_PROVIDER_SITE_OTHER): Payer: Medicare Other | Admitting: Orthopaedic Surgery

## 2016-01-22 VITALS — BP 147/98 | HR 98 | Ht 62.0 in | Wt 193.0 lb

## 2016-01-22 DIAGNOSIS — M6281 Muscle weakness (generalized): Secondary | ICD-10-CM | POA: Diagnosis not present

## 2016-01-22 DIAGNOSIS — Z9181 History of falling: Secondary | ICD-10-CM

## 2016-01-22 DIAGNOSIS — I10 Essential (primary) hypertension: Secondary | ICD-10-CM | POA: Diagnosis not present

## 2016-01-22 DIAGNOSIS — F71 Moderate intellectual disabilities: Secondary | ICD-10-CM | POA: Diagnosis not present

## 2016-01-22 DIAGNOSIS — H409 Unspecified glaucoma: Secondary | ICD-10-CM | POA: Diagnosis not present

## 2016-01-22 DIAGNOSIS — E119 Type 2 diabetes mellitus without complications: Secondary | ICD-10-CM | POA: Diagnosis not present

## 2016-01-22 DIAGNOSIS — M5136 Other intervertebral disc degeneration, lumbar region: Secondary | ICD-10-CM | POA: Diagnosis not present

## 2016-01-22 NOTE — Progress Notes (Signed)
Office Visit Note   Patient: Linda Thompson        Date of Birth: May 12, 1955           MRN: [id number] Visit Date: 01/22/2016              Requested by: [name], DO [address] [place], [place] [address] PCP: [name]., DO   Assessment & Plan: Visit Diagnoses:  1. Risk for falls   Patient was getting out of the car by the curb and better quad cane turned around 180 but in the event she fell down she is not able to get up and be helped up. She scraped her elbow scraped her ankle. X-rays are ankle reviewed and were negative. She is using a rolling walker with reverse seat.  Plan: She will continue leg strengthening and do not think she needs home health physical therapy she can of her purse strap over her ankle and do straight leg raising exercises. Should go to the store gravida cart will come down now doing better weather and in good whether she can go about use her walker and role around the neighborhood. She is here with the neighbor and caregiver and also power of attorney. Office follow-up will be when necessary.  Follow-Up Instructions: Return if symptoms worsen or fail to improve.   Orders:  No orders of the defined types were placed in this encounter.  No orders of the defined types were placed in this encounter.     Procedures: No procedures performed   Clinical Data: No additional findings.   Subjective: Chief Complaint  Patient presents with  . Lower Back - Follow-up  . Left Leg - Follow-up    Patient returns for follow up left hip and low back. Since last visit, she has fallen and was treated in ER on 12/11/2015. She hurt her left ankle, and skinned elbow and knee. She thinks that she fell due to the cane. She has since gotten a rolling walker and that has helped a lot. She has been having home physical therapy which has helped a lot as well. They have been very encouraging. She is using tylenol and using a heating pad with  relief.    Review of Systems  Constitutional: Negative for chills and diaphoresis.  HENT: Negative for ear discharge, ear pain and nosebleeds.   Eyes: Negative for discharge and visual disturbance.  Respiratory: Negative for cough, choking and shortness of breath.   Cardiovascular: Negative for chest pain and palpitations.  Gastrointestinal: Negative for abdominal distention and abdominal pain.  Endocrine: Negative for cold intolerance and heat intolerance.        Positive for sarcoidosis with positive PET scan for lymph node enlargement.  Genitourinary: Negative for flank pain and hematuria.  Musculoskeletal:       History of falls past history of positive back pain. She's had the fair balance with occasional falling. She's had therapy coming out to her house for short period time. She was using a 4 pronged cane but now is using a rolling walker with reverse E.  Skin: Negative for rash and wound.  Neurological: Negative for seizures and speech difficulty.  Hematological: Negative for adenopathy. Does not bruise/bleed easily.  Psychiatric/Behavioral: Negative for agitation and suicidal ideas.     Objective: Vital Signs: BP (!) 147/98   Pulse 98   Ht 5\' 2"  (1.575 m)   Wt 193 lb (87.5 kg)   LMP 01/21/1992 (Approximate)  BMI 35.30 kg/m   Physical Exam  Constitutional: She is oriented to person, place, and time. She appears well-developed.  HENT:  Head: Normocephalic.  Right Ear: External ear normal.  Left Ear: External ear normal.  Eyes: Pupils are equal, round, and reactive to light.  Neck: No tracheal deviation present. No thyromegaly present.  Cardiovascular: Normal rate.   Pulmonary/Chest: Effort normal.  Abdominal: Soft.  Musculoskeletal:  Good plantar flexion dorsiflexion. No pain with hip range of motion these reach full extension. She can ambulate without a rolling walker in the office. She has some quad weakness keeps the right upper trunk forward and has full  extension of her knees when she walks.  Neurological: She is alert and oriented to person, place, and time.  Skin: Skin is warm and dry.  Psychiatric: She has a normal mood and affect. Her behavior is normal.  Patient's very pleasant alert conversant. She has mild mental retardation she is a mature with a rolling walker but can ambulate in the office without rolling walker.    Ortho Exam  Specialty Comments:  No specialty comments available.  Imaging: No results found.   PMFS History: Patient Active Problem List   Diagnosis Date Noted  . Risk for falls 12/25/2015  . Arthritis 12/03/2015  . Sarcoidosis ([place]) 10/27/2014  . Obesity, morbid ([place]) 05/05/2011  . Irritable bowel syndrome 03/05/2010  . Diabetes ([place]) 08/13/2007  . ALLERGIC  RHINITIS 09/23/2006  . Hyperlipemia 03/11/2006  . MENTAL RETARDATION, MODERATE 03/11/2006  . GLAUCOMA NOS 03/11/2006  . Essential hypertension 03/11/2006  . GERD 03/11/2006  . MENINGIOMA 12/20/2001   Past Medical History:  Diagnosis Date  . Allergic rhinitis   . Allergy   . Anemia   . Chronic low back pain   . DDD (degenerative disc disease), lumbar   . GERD (gastroesophageal reflux disease)    w/ hx diaphragmatic hernia  . Glaucoma   . GLAUCOMA NOS 03/11/2006   Qualifier: Diagnosis of  By: Diona Browner MD, Amy    . Heart murmur   . Hx of fracture    R arm, foot  . Hyperlipemia   . Hypertension   . IBS (irritable bowel syndrome)   . Low back pain 11/22/2012  . Meningioma ([place])    ant falx, stable on prior imaging  . Moderate intellectual disabilities   . Murmur   . Obesity   . Osteoarthrosis, unspecified whether generalized or localized, ankle and foot   . Sarcoidosis ([place])    difuse bony lesions and LDA, dx by biopsy in 2016  . Transaminitis   . Type II or unspecified type diabetes mellitus without mention of complication, not stated as uncontrolled     Family History  Problem Relation Age of Onset  . Diabetes Mother   . Stroke  Mother   . Hypertension Mother   . Stroke Father   . Diabetes Maternal Aunt   . Heart disease Maternal Aunt      (had pacemaker)  . Diabetes Maternal Grandmother   . Stroke Maternal Grandmother   . Hypertension Maternal Grandmother   . Colon cancer Neg Hx     Past Surgical History:  Procedure Laterality Date  . CATARACT EXTRACTION    . COLONOSCOPY  04/15/2011  . ELBOW SURGERY     right elbow  . LIPOMA EXCISION Left [id number]   posterior left axilla  . TOTAL ABDOMINAL HYSTERECTOMY  03/05/1992   leiomyoma and cellular leiomyoma   Social History   Occupational History  .  Disabled (from arm fracture)    Social History Main Topics  . Smoking status: Never Smoker  . Smokeless tobacco: Never Used  . Alcohol use No  . Drug use: No  . Sexual activity: Not Currently    Birth control/ protection: Surgical     Comment: Hyst--TAH--unsure if has ovaries

## 2016-01-23 DIAGNOSIS — F71 Moderate intellectual disabilities: Secondary | ICD-10-CM | POA: Diagnosis not present

## 2016-01-23 DIAGNOSIS — M6281 Muscle weakness (generalized): Secondary | ICD-10-CM | POA: Diagnosis not present

## 2016-01-23 DIAGNOSIS — H409 Unspecified glaucoma: Secondary | ICD-10-CM | POA: Diagnosis not present

## 2016-01-23 DIAGNOSIS — E119 Type 2 diabetes mellitus without complications: Secondary | ICD-10-CM | POA: Diagnosis not present

## 2016-01-23 DIAGNOSIS — I10 Essential (primary) hypertension: Secondary | ICD-10-CM | POA: Diagnosis not present

## 2016-01-23 DIAGNOSIS — M5136 Other intervertebral disc degeneration, lumbar region: Secondary | ICD-10-CM | POA: Diagnosis not present

## 2016-01-24 ENCOUNTER — Ambulatory Visit: Payer: Self-pay

## 2016-01-25 ENCOUNTER — Other Ambulatory Visit: Payer: Self-pay

## 2016-01-25 ENCOUNTER — Other Ambulatory Visit: Payer: Self-pay | Admitting: Pharmacist

## 2016-01-25 NOTE — Patient Outreach (Signed)
[facility]) Care Management  01/25/2016  Linda Thompson 24-May-1955 [id number]   Telephonic Monthly Assessment   Referral Date:  11/27/2015 Insurance:  Medicare  H/o Screening and Initial Assessment 11/29/2015.   H/o high utilization of services:  H/o patient scheduling appointment for minor concerns.  THN to act as liaison between patient at home and scheduling appointments.   Patient prefers to see her MD because she understands better when having a face to face visit  Subjective:   Patient completed call.  [facility] [id number], [place] [address]  Providers: [facility] Primary MD: Dr. [name], DO  -  last appt: 10/27/15 and next appt was 01/07/16 (Diabetes follow-up) / patient did not keep appt due to transportation issues with caregiver.  Appt rescheduled for  02/14/16 if transportation can be arranged by caregiver. Podiatrist:  Dr. Gardiner Barefoot - last appt 11/28/15 - next appt 3 months Gynecologist: Dr. Hale Bogus and Melvia Heaps, CNM - last appt -  11/26/15 Orthopedist: Dr. Lorin Mercy - last appt 01/2016 HH:  PT - [facility]  # [phone]   Psycho/Social: Patient with moderate mental retardation and  lives in [place] apartment alone.  Parents are deceased.  Patient has HCPOA and attorney who manages her care.  Mobility:  Patient C/O weakness Left Leg and giving away.   Falls: (1)  December 19, 2015 Pain: arthritis Left hip. Rolator ordered and h/o order faxed to [facility] [phone]  Attn:  Betsy on 12/24/2015.  Delivery scheduled for 12/26/15.  Depression: none Transportation:  Patient not driving since fall on 12-19-2015 Caregiver:  Parents: deceased.  Ms. Linda Thompson [phone] POA:  Brien Mates Ph: [phone] / Fax: [phone] takes care of paying all of patients bills.  Irven Coe P[address]O. [place], [place] [address]  Patient states she receives regular calls from her [place]  office to check on her.  Advance Directive: states Ms Linda Thompson would make decisions.  Consent:  yes DME:  Cane (4 pronged),  Rolator, eyeglasses, partial lower dental plate.   Co-morbidities:  Diabetes Type 2, Essential hypertension, GERD, IBS, Meningioma, Mental Retardation (Moderate); Glaucoma, Obesity, morbid; Sarcoidosis with positive PET scan for lymph node enlargement.  Admissions: 0 ER visits: 1  Diabetes Type 2: Patient does not check BS's (per patient:  "MD does not order me to check my BS; Dr. Prince Rome advised patient 4 years ago.    Glucometer in the home:  None Patient states she is not eating any salt.  Grits, eggs 2,  [employer] bread  Chicken and dumplings  (canned) Beef stew (canned) Peas  (canned) Green beans  (canned) Banana once in a while  HTN Elevated BP and weight over the past 30-60 days. Patient eating canned / high sodium foods but states she does not eat any salt.    Osteoarthritis H/o ED visit 19-Dec-2015  H/o patient fell getting out of car.  X-ray negative.  ED advised to follow-up with Orthopedics and ASO splint applied while in ED.  Rolator ordered. HH:   PT services [facility]. Patient completed Orthopedic appt with Dr. Lorin Mercy 01/22/2016  MD.  Dr. Lorin Mercy evaluated patient and walker.  MD recommended patient not use her walker with wheels to help strengthen her leg. Patient states she will completed [facility] PT services next week. Patient is using heating pad, rest for management.  States MD advised may use Tylenol for paint as needed.  Patient  states she has not used any Tylenol in two weeks.  Patient states ok to start driving again but patient states she is not comfortable driving yet.   Hemorrhoid, internal with perirectal irritation Patient continues to reports improvement in itching since bathing more often.    BP 147/98 01/22/2016 (Dr. Inda Merlin Visit) Weight 188 lb (85 kg) 12/11/2015  and Weight 193 01/22/2016 (Dr. Inda Merlin Visit) Height 62 in (157 cm)  12/11/2015 BMI 34.50 (Obese Class I) 12/11/2015  Lipid Panel completed 12/12/2013 HDL 28.200 12/12/2013 LDL 78.800 12/12/2013 Cholesterol, total 141.000 12/12/2013 Triglycerides 266.000 12/12/2013 A1C 6.700 08/14/2015 Glucose Random 119.000 02/01/2015  Medications:  Prevnar (PCV13) N/D Pneumovax (PP 02/08/2009 Flu Vaccine 08/31/2015 tDAP Vaccine 08/08/2014 [facility] and [facility] herself  Encounter Medications:  Outpatient Encounter Prescriptions as of 01/25/2016  Medication Sig  . acetaminophen (TYLENOL) 500 MG tablet Take 500 mg by mouth as needed (left ear pain). Reported on 04/18/2015  . diltiazem (CARDIZEM CD) 240 MG 24 hr capsule TAKE ONE CAPSULE BY MOUTH EVERY DAY  . ferrous sulfate 325 (65 FE) MG tablet TAKE 1 TABLET (325 MG TOTAL) BY MOUTH 2 (TWO) TIMES DAILY WITH A MEAL.  . hydrochlorothiazide (MICROZIDE) 12.5 MG capsule TAKE 1 CAPSULE BY MOUTH ONCE DAILY  . hydrocortisone 2.5 % ointment APPLY TO AFFECTED AREA TWICE A DAY  . ipratropium (ATROVENT) 0.06 % nasal spray PLACE 2 SPRAYS INTO BOTH NOSTRILS 4 (FOUR) TIMES DAILY.  Marland Kitchen ketoconazole (NIZORAL) 2 % shampoo APPLY TO AFFECTED AREA TWICE A WEEK AS DIRECTED  . Lactobacillus (FLORAJEN ACIDOPHILUS PO) Take 1 tablet by mouth daily.  Marland Kitchen latanoprost (XALATAN) 0.005 % ophthalmic solution Place 1 drop into both eyes at bedtime.   Marland Kitchen liver oil-zinc oxide (DESITIN) 40 % ointment Apply 1 application topically as needed for irritation.  Marland Kitchen loperamide (IMODIUM) 2 MG capsule Take 2 mg by mouth as needed for diarrhea or loose stools (PRN diarrhea).  . loratadine (CLARITIN) 10 MG tablet Take 10 mg by mouth daily.  . metFORMIN (GLUCOPHAGE) 500 MG tablet TAKE 1 TABLET BY MOUTH TWICE A DAY WITH A MEAL  . omeprazole (PRILOSEC) 20 MG capsule TAKE 1 CAPSULE (20 MG TOTAL) BY MOUTH 2 (TWO) TIMES DAILY.  Marland Kitchen potassium chloride SA (KLOR-CON M20) 20 MEQ tablet Take 1 tablet (20 mEq total) by mouth daily.  . pravastatin (PRAVACHOL) 40 MG tablet  TAKE 1 TABLET BY MOUTH EVERY EVENING  . Propylene Glycol (SYSTANE BALANCE) 0.6 % SOLN   . traMADol (ULTRAM) 50 MG tablet Take 1 tablet (50 mg total) by mouth daily. (Patient not taking: Reported on 01/25/2016)   No facility-administered encounter medications on file as of 01/25/2016.     Functional Status:  In your present state of health, do you have any difficulty performing the following activities: 01/25/2016 12/25/2015  Hearing? N N  Vision? N N  Difficulty concentrating or making decisions? N N  Walking or climbing stairs? Y Y  Dressing or bathing? N N  Doing errands, shopping? Y Y  Preparing Food and eating ? - -  Using the Toilet? - -  In the past six months, have you accidently leaked urine? - -  Do you have problems with loss of bowel control? - -  Managing your Medications? - -  Managing your Finances? - -  Housekeeping or managing your Housekeeping? - -  Some recent data might be hidden    Fall/Depression Screening: PHQ 2/9 Scores 01/25/2016 01/03/2016 12/25/2015 12/11/2015 11/29/2015  PHQ - 2  Score 0 0 0 0 0   Last Fall Date:  12/11/15 Fall Risk  01/25/2016 12/25/2015 12/11/2015 12/03/2015 11/29/2015  Falls in the past year? (No Data) Yes No No No  Number falls in past yr: 1 1 - - -  Injury with Fall? Yes Yes - - -  Risk Factor Category  High Fall Risk High Fall Risk - - -  Risk for fall due to : History of fall(s);Impaired balance/gait;Impaired mobility History of fall(s);Impaired balance/gait;Impaired mobility - - -  Follow up Falls evaluation completed;Education provided;Falls prevention discussed Falls evaluation completed;Education provided;Falls prevention discussed - - -    Preventives: Hearing: no issues  Eyes: Dr. 10/2015  Dentist: no issues and has not seen in a long time.  Bone Density: unknown Colonoscopy 04/18/2015 Mammogram 09/19/2015 Endo - DM Eye Exam: positive retin 05/29/2015 Endo - DM Foot Exam 02/01/2015  Plan:  Referral Date:  11/27/2015 Screening and  Initial Assessment 11/29/2015 Telephonic RN CM 11/29/2015 Program:  DM 11/29/2015  H/o High utilization of provider services (improved) RN CM reviewed utilization of Dr. Inda Merlin services; patient ask if she did the right thing or should she have just called MD.  RN CM advised office visit was appropriate.  RN CM will continue to coach on appropriate use of services.   Diabetes Confirm DM home testing order DM appt adherence  Transportation barrier to DM follow-up appt. (SW Active) RN CM will continue to assess patients ability to use home scales, BP cuff, Glucometer via consulting PCP.   Emmi Educational Materials:  DM education Low carb diet Low salt diet   HTN RN CM will provide education on low salt diet and weight management.   Osteoarthritis / Fall Prevention:  RN CM will follow-up HH PT for OP PT recommendations - [facility]  # [phone]   Hemorrhoid, internal with perirectal irritation RN CM will continue to encourage good hygiene.   [facility] Pharmacy Referral (12/31/2015  [facility] SW Referral (12/31/2015 -Patient currently in [place] but needs higher level of care to meet needs.  -H/o PACE Referral sent by RN CM on 12/31/2015  RN CM advised in next [facility] scheduled contact call within next 30 days for monthly assessment and care coordination services as needed. RN CM advised to please notify MD of any changes in condition prior to scheduled appt's.   RN CM provided contact name and # [phone] or main office # [phone] and 24-hour nurse line # [phone].  RN CM confirmed patient is aware of 911 services for urgent emergency needs.   [facility] CM Care Plan Problem One   Flowsheet Row Most Recent Value  Care Plan Problem One  Knowledge deficit associate to Diabetes complications.    Role Documenting the Problem One  Care Management Telephonic [place] for Problem One  Active  THN Long Term Goal (31-90 days)  Patient will engage with [facility] RN CM to improve  DM self management over the next 31-90 days.   THN Long Term Goal Start Date  11/29/15  Interventions for Problem One Long Term Goal  RN CM will assess and monitor symptoms and provide education as needed over the next 31-90 days.   THN CM Short Term Goal #1 (0-30 days)  Patient will review EMMI educational materials over the next 30 days.   THN CM Short Term Goal #1 Start Date  01/25/16  Interventions for Short Term Goal #1  RN CM will mail Emmi Ed over the next 30 days.  [facility] CM Care Plan Problem Two   Flowsheet Row Most Recent Value  Care Plan Problem Two  Fall Prevention,  last fall date 03/13/2015.  Role Documenting the Problem Two  Care Management Telephonic Coordinator  Care Plan for Problem Two  Active  THN CM Short Term Goal #1 (0-30 days)  Patient will adhere to PT exercises and MDs recommendattions over the next 30 days.   THN CM Short Term Goal #1 Start Date  01/25/16  [facility] CM Short Term Goal #1 Met Date   01/25/16  Interventions for Short Term Goal #2   RN CM will discuss PT recommendations post [facility] PT services with [facility] therapist over the next 30 days.     [facility] CM Care Plan Problem Three   Flowsheet Row Most Recent Value  Care Plan Problem Three  Increased utilization of provider services   Role Documenting the Problem Three  Care Management Telephonic Coordinator  Care Plan for Problem Three  Active  THN CM Short Term Goal #1 (0-30 days)  Patient will review her concerns and need to see / speak with MDs over the next 30 days   THN CM Short Term Goal #1 Start Date  01/25/16  [facility] CM Short Term Goal #1 Met Date  01/25/16  Interventions for Short Term Goal #1  RN CM will continue to provide ongoing education to teach patient appropriate use of services over the next 30 days.        Nathaneil Canary, BSN, RN, [place] Management Care Management Coordinator [phone] Direct [phone] Cell [phone] Office [phone]  Fax [email or web address]

## 2016-01-25 NOTE — Patient Outreach (Signed)
Received a pharmacy referral regarding patient's medication management. Per referral, patient's caregiver and HCPOA, [name], states [facility] and likes to keep a 3 month supply. Called and spoke with [name]. [name] confirms patient's date of birth.  [name] reports that she has had challenges with patient keeping large supplies of her medications and with keeping expired medication on hand. Reports that she has recently gone through the patient's home with her and discarded all expired medication. Reports that Ms. Lubas does still have several months worth of each of her medication in the home. However, reports that patient gets uncomfortable if her medication ever runs low. Reports that patient does well administering her own medications. Denies any concerns about patient taking extra doses of her medications. Advise [name] to consider [facility] to let the pharmacist know of her concerns about early refills for [facility]. [name] states that she will do this.   [name] denies any current medication questions or concerns for the patient. Provide [name] with my phone number.  Harlow Asa, PharmD, [place] Management [phone]

## 2016-01-28 ENCOUNTER — Other Ambulatory Visit: Payer: Self-pay

## 2016-01-28 ENCOUNTER — Other Ambulatory Visit: Payer: Self-pay | Admitting: Licensed Clinical Social Worker

## 2016-01-28 DIAGNOSIS — M6281 Muscle weakness (generalized): Secondary | ICD-10-CM | POA: Diagnosis not present

## 2016-01-28 DIAGNOSIS — E119 Type 2 diabetes mellitus without complications: Secondary | ICD-10-CM | POA: Diagnosis not present

## 2016-01-28 DIAGNOSIS — I10 Essential (primary) hypertension: Secondary | ICD-10-CM | POA: Diagnosis not present

## 2016-01-28 DIAGNOSIS — M5136 Other intervertebral disc degeneration, lumbar region: Secondary | ICD-10-CM | POA: Diagnosis not present

## 2016-01-28 DIAGNOSIS — H409 Unspecified glaucoma: Secondary | ICD-10-CM | POA: Diagnosis not present

## 2016-01-28 DIAGNOSIS — F71 Moderate intellectual disabilities: Secondary | ICD-10-CM | POA: Diagnosis not present

## 2016-01-28 NOTE — Patient Outreach (Signed)
[facility]) Care Management  01/28/2016  KATANYA KRASNOW May 17, 1955 [id number]   Assessment-CSW completed outreach attempt today. CSW unable to reach patient's HCPOA successfully. CSW left a HIPPA compliant voice message encouraging [name] to return call once available.  Plan-CSW will await return call or complete an additional outreach if needed within 2 weeks.  [name], BSW, MSW, [email or web address] Phone: [phone] Fax: [phone]

## 2016-01-28 NOTE — Patient Outreach (Addendum)
[place]) Care Management  01/28/2016  Linda Thompson [birth year] [id number]   Telephonic Care Coordination Services   Diabetes A1C 6.700 08/14/2015 Glucose Random 119.000 02/01/2015 Primary MD: Dr. [name], DO, [facility]  -  last appt: 10/27/15 and next appt was 01/07/16 (Diabetes follow-up) / patient did not keep appt due to transportation issues with caregiver.  Appt rescheduled for  02/14/16 (if transportation can be arranged by caregiver).  Transportation barrier to DM follow-up appt. (SW Active) - SW notified of this update 01/28/16 RN CM sent Epic in basket to Dr. Maudie Mercury to confirm DM home BS  testing order (patient currently not testing) MD UPDATE:  "Thank you for your care. She is not on insulin, so does not need to routinely monitor home glucose. Working on a healthy diet would be great! Thanks."   [employer] (mailed 01/28/16) -Diabetes Care Checklist -Diabetes:  Why Get Your A1C Checked? -Diabetes:  Controlling Blood Sugar -Type 2 Diabetes:  Taking Care of Yourself Throughout The Year -Counting Carbohydrates -Low-Salt Diet -About High Blood Pressure (Hypertension) -High Blood Pressure (HyperTension):  What You Can Do -How to [place], MSHL, BSN, RN, [place] [facility] Care Management Care Management Coordinator [phone] Direct [phone] Cell [phone] Office [phone] Fax [email or web address]

## 2016-01-28 NOTE — Patient Outreach (Signed)
[name]) Care Management  01/28/2016  Linda Thompson 1955-12-24 [id number]   Assessment- CSW received incoming call from patient's caregiver [name]. [name] reports that they have a very nice holiday. She states that her husband [name] took patient out this morning to run errands. She reports that her physical therapist will be visiting patient this week and that [facility] with [facility] should be ending soon. She reports that Dr. [name] has encouraged patient to not use walker in order to gain strength. She reports that her spouse [name] who also provides carevgiving services to patient will be having knee replacement surgery on 02/13/16 and they will be unable to take patient out to run errands for 2-3 weeks. [name] states that she has notified patient's lawyer and she is hopeful that they will find someone to assist patient during that time. She shares that lawyer and his assistant will be completing a home visit this week. CSW spent time educating HCPOA on personal care resources, senior centers and socialization opportunities within [place]. She states that patient still has all resources provided in a folder. [name] states that they will continue to consider them but at this time they do not need CSW to make referrals.  CSW received in basket message from [place] who stated that patient canceled appointment with Dr. [name] on 01/07/16 because patient stated that her caregiver could not take her. [name] stated that she did not know about this appointment. CSW informed her that appointment was rescheduled for 02/14/16 which is the day after her spouse's surgery. Caregiver reports that patient should be able to drive herself to this appointment and will follow up.  CSW completed call to patient and patient answered. CSW questioned if she needed assistance with getting to PCP appointment on 02/14/16. She reports that she was informed by [facility] that she can drive again. She states that her car "should" be  working but she has not started it in over a month. She also has [facility] but has never used their services. Patient is agreeable to CSW completing home visit this week on 01/30/15 with Pharmacist intern in order to review community resources and assist with transportation barrier.  Plan-CSW will complete home visit on 01/30/15.  [name], BSW, MSW, [email or web address] Phone: [phone] Fax: [phone]

## 2016-01-28 NOTE — Patient Outreach (Signed)
[facility]) Care Management  01/28/2016  Linda Thompson 01-Apr-1955 [id number]   Osteoarthritis / Fall Prevention:   Contact call to [facility]  # [phone] for any further rehab recommendations.  Contact:  Almyra Deforest Discharge visit completed today 01/28/16.  States patient continues to need stand by assistance on unlevel ground and is using a Corporate investment banker.  Estill Bamberg provided name and contact number for PT, Estill Bamberg Roles [phone] to discuss further discharge recommendations as final note is incomplete.   Contact call to PT, Estill Bamberg Roles [phone] number to discuss further discharge recommendations RN CM left message requesting call back to discuss Patient's report that Dr. Inda Merlin would like for patient to walk without walker to help strengthen her leg.  Any further recommendations such as OP PT services Provided current update on Transportation issues.  RN CM left number for call back.   Nathaneil Canary, BSN, RN, [place] Care Management Care Management Coordinator [phone] Direct [phone] Cell [phone] Office [phone] Fax [email or web address]

## 2016-01-30 ENCOUNTER — Other Ambulatory Visit: Payer: Self-pay | Admitting: Licensed Clinical Social Worker

## 2016-01-30 NOTE — Patient Outreach (Signed)
[facility]) Care Management  [facility] Social Work  01/30/2016  Linda Thompson [birth year] [id number]  Encounter Medications:  Outpatient Encounter Prescriptions as of 01/30/2016  Medication Sig  . acetaminophen (TYLENOL) 500 MG tablet Take 500 mg by mouth as needed (left ear pain). Reported on 04/18/2015  . diltiazem (CARDIZEM CD) 240 MG 24 hr capsule TAKE ONE CAPSULE BY MOUTH EVERY DAY  . ferrous sulfate 325 (65 FE) MG tablet TAKE 1 TABLET (325 MG TOTAL) BY MOUTH 2 (TWO) TIMES DAILY WITH A MEAL.  . hydrochlorothiazide (MICROZIDE) 12.5 MG capsule TAKE 1 CAPSULE BY MOUTH ONCE DAILY  . hydrocortisone 2.5 % ointment APPLY TO AFFECTED AREA TWICE A DAY  . ipratropium (ATROVENT) 0.06 % nasal spray PLACE 2 SPRAYS INTO BOTH NOSTRILS 4 (FOUR) TIMES DAILY.  Marland Kitchen ketoconazole (NIZORAL) 2 % shampoo APPLY TO AFFECTED AREA TWICE A WEEK AS DIRECTED  . Lactobacillus (FLORAJEN ACIDOPHILUS PO) Take 1 tablet by mouth daily.  Marland Kitchen latanoprost (XALATAN) 0.005 % ophthalmic solution Place 1 drop into both eyes at bedtime.   Marland Kitchen liver oil-zinc oxide (DESITIN) 40 % ointment Apply 1 application topically as needed for irritation.  Marland Kitchen loperamide (IMODIUM) 2 MG capsule Take 2 mg by mouth as needed for diarrhea or loose stools (PRN diarrhea).  . loratadine (CLARITIN) 10 MG tablet Take 10 mg by mouth daily.  . metFORMIN (GLUCOPHAGE) 500 MG tablet TAKE 1 TABLET BY MOUTH TWICE A DAY WITH A MEAL  . omeprazole (PRILOSEC) 20 MG capsule TAKE 1 CAPSULE (20 MG TOTAL) BY MOUTH 2 (TWO) TIMES DAILY.  Marland Kitchen potassium chloride SA (KLOR-CON M20) 20 MEQ tablet Take 1 tablet (20 mEq total) by mouth daily.  . pravastatin (PRAVACHOL) 40 MG tablet TAKE 1 TABLET BY MOUTH EVERY EVENING  . Propylene Glycol (SYSTANE BALANCE) 0.6 % SOLN   . traMADol (ULTRAM) 50 MG tablet Take 1 tablet (50 mg total) by mouth daily. (Patient not taking: Reported on 01/25/2016)   No facility-administered encounter medications on file as of 01/30/2016.      Functional Status:  In your present state of health, do you have any difficulty performing the following activities: 01/25/2016 12/25/2015  Hearing? N N  Vision? N N  Difficulty concentrating or making decisions? N N  Walking or climbing stairs? Y Y  Dressing or bathing? N N  Doing errands, shopping? Y Y  Preparing Food and eating ? - -  Using the Toilet? - -  In the past six months, have you accidently leaked urine? - -  Do you have problems with loss of bowel control? - -  Managing your Medications? - -  Managing your Finances? - -  Housekeeping or managing your Housekeeping? - -  Some recent data might be hidden    Fall/Depression Screening:  PHQ 2/9 Scores 01/30/2016 01/28/2016 01/25/2016 01/03/2016 12/25/2015 12/11/2015 11/29/2015  PHQ - 2 Score 0 0 0 0 0 0 0    Assessment: CSW completed routine home visit with patient and [facility] Intern. Patient reports that she is doing well today and is not experiencing any pain or discomfort. She reports that her lawyer (payee) and his assistant will be completing home visit tomorrow. Patient is not able to afford the PACE program and therefore needs other assistance in place. Patient still had all resources provided during last home visit in [facility] folder. Patient's physical therapy ended this week. Her PCP has encouraged her to not use walker and she states that she has been following this instruction.  Her physical therapist provided her a list of exercises for her to complete daily and patient confirms that she has been doing these. Patient still has a female come to her home 1 per month to clean her apartment. Patient's caregiver Fannie Knee and Rosanne Ashing [place] are unable to provide caregiver assistance for 3 weeks (after 02/13/16) for patient due to their own health concerns. Patient will need someone to assist her with her weekly errands Rosanne Ashing or Fannie Knee take patient to get groceries, go to the bank and pick up medications.) CSW completed review of resources:  [employer], [employer], [place], [employer] and In [employer]. Patient states that she will consider going to a [employer] (calendar, address and information provided on all Senior Centers in [place]) but that she is not agreeable to [facility] or [facility]. Patient is agreeable to CSW putting her on the wait list for In Home [employer] program. CSW completed call to DSS two times today but was unable to reach anyone. CSW will attempt to make referral within the next two weeks. Current wait time for [facility] is 2 years. Several times throughout the session, patient expresses her fear of having a physician's appointment on a day that has bad weather. CSW reminded her that she can always cancel her appointment due to bad weather. Patient also expressed her fears for having a fall. CSW provided emotional support to assist with patient's fears. Patient appreciative of this assistance. CSW questioned if patient has been taking baths as hygiene has been a concern of her caregivers. She reports that she is showering 2x per week. CSW provided basic hygiene education.   CSW reviewed all upcoming medical appointments. However, patient had a list of cards of all of her upcoming appointments. Patient does very well with structure and routine and has no issues keeping up with her medical appointments or remembering to take her medications. Patient has a PCP appointment on 02/14/16 and her usual caregivers will be unable to transport her. CSW reminded patient that she can use [facility] or she can drive herself. Patient states that she feels comfortable driving herself to PCP appointment because it is very close to her residence. However, she is willing to use [facility] as back up form of transportation. CSW wrote out [facility] instructions on how to schedule transportation arrangements, their cost and what information they will need from her to schedule transportation arrangements.   Plan: CSW will make referral to  [facility] Home [facility] when able to reach program. CSW will follow up with patient in 30 days.  Dickie La, BSW, MSW, LCSW [employer] [email or web address] Phone: [phone] Fax: [phone]

## 2016-01-31 ENCOUNTER — Other Ambulatory Visit: Payer: Self-pay | Admitting: Licensed Clinical Social Worker

## 2016-01-31 NOTE — Patient Outreach (Signed)
[facility]) Care Management  01/31/2016  Linda Thompson 04-19-1955 [id number]   Assessment- CSW completed call to DSS on 01/31/16 and spoke to [facility]. CSW was able to successfully complete referral to program. Patient will receive 8-10 hours per week once she is off the wait list. The current wait time for program is 18-24 months.  Plan-CSW will follow up with patient within 30 days.  [name], BSW, MSW, [email or web address] Phone: [phone] Fax: [phone]

## 2016-02-04 ENCOUNTER — Telehealth: Payer: Self-pay | Admitting: Certified Nurse Midwife

## 2016-02-04 NOTE — Telephone Encounter (Signed)
Spoke with patient. Patient states that she has been using coconut oil nightly. Is having occasional vaginal itching. Asking if she needs to be seen for further evaluation. Denies any vaginal discharge or redness. Advised patient occassional itching is common. If itching is persistent, worsens, or develops new symptoms she will need to be seen for further evaluation. Advised to continue using coconut oil nightly and call to be seen for evaluation if needed. Patient verbalizes understanding. 28 minute phone call.  Routing to provider for final review. Patient agreeable to disposition. Will close encounter.

## 2016-02-04 NOTE — Telephone Encounter (Signed)
Patient wants to speak with the nurse. She states she has been using coconut oil but is having some itching.

## 2016-02-07 ENCOUNTER — Ambulatory Visit: Payer: Medicare Other | Admitting: Podiatry

## 2016-02-11 ENCOUNTER — Telehealth: Payer: Self-pay | Admitting: Certified Nurse Midwife

## 2016-02-11 ENCOUNTER — Other Ambulatory Visit: Payer: Self-pay | Admitting: Family Medicine

## 2016-02-11 ENCOUNTER — Other Ambulatory Visit: Payer: Self-pay | Admitting: Gastroenterology

## 2016-02-11 DIAGNOSIS — H6993 Unspecified Eustachian tube disorder, bilateral: Secondary | ICD-10-CM | POA: Diagnosis not present

## 2016-02-11 NOTE — Telephone Encounter (Signed)
Patient wants to speak with the nurse no information given. °

## 2016-02-11 NOTE — Telephone Encounter (Signed)
Spoke with patient. Patient states that she has been having increased itching since last week. Is having itching intermittently every other day. Reports it is resolved with scratching. Denies any vaginal discharge. Is unable to assess vaginal tissue for redness or swelling. Patient is using coconut oil daily. Denies using any other creams. Advised patient it is common to have occasional itching and if this is relieved with scratching and does not continue it is okay to monitor. Patient is concerned as she feels this has gotten worse since last week. Requesting an appointment. Appointment scheduled for 02/12/2016 at 11 am with [name]. Patient is agreeable to date and time.  Routing to provider for final review. Patient agreeable to disposition. Will close encounter.

## 2016-02-12 ENCOUNTER — Telehealth: Payer: Self-pay | Admitting: Certified Nurse Midwife

## 2016-02-12 ENCOUNTER — Ambulatory Visit (INDEPENDENT_AMBULATORY_CARE_PROVIDER_SITE_OTHER): Payer: Medicare Other | Admitting: Certified Nurse Midwife

## 2016-02-12 ENCOUNTER — Encounter: Payer: Self-pay | Admitting: Certified Nurse Midwife

## 2016-02-12 ENCOUNTER — Encounter (HOSPITAL_COMMUNITY): Payer: Self-pay | Admitting: Emergency Medicine

## 2016-02-12 ENCOUNTER — Emergency Department (HOSPITAL_COMMUNITY)
Admission: EM | Admit: 2016-02-12 | Discharge: 2016-02-12 | Disposition: A | Discharge status: Home / Self Care | Payer: Medicare Other | Attending: Emergency Medicine | Admitting: Emergency Medicine

## 2016-02-12 DIAGNOSIS — W19XXXA Unspecified fall, initial encounter: Secondary | ICD-10-CM | POA: Diagnosis not present

## 2016-02-12 DIAGNOSIS — B372 Candidiasis of skin and nail: Secondary | ICD-10-CM

## 2016-02-12 DIAGNOSIS — Z7984 Long term (current) use of oral hypoglycemic drugs: Secondary | ICD-10-CM | POA: Diagnosis not present

## 2016-02-12 DIAGNOSIS — S8992XA Unspecified injury of left lower leg, initial encounter: Secondary | ICD-10-CM | POA: Diagnosis not present

## 2016-02-12 DIAGNOSIS — S80212A Abrasion, left knee, initial encounter: Secondary | ICD-10-CM | POA: Insufficient documentation

## 2016-02-12 DIAGNOSIS — Y9289 Other specified places as the place of occurrence of the external cause: Secondary | ICD-10-CM | POA: Insufficient documentation

## 2016-02-12 DIAGNOSIS — Y999 Unspecified external cause status: Secondary | ICD-10-CM | POA: Diagnosis not present

## 2016-02-12 DIAGNOSIS — T148XXA Other injury of unspecified body region, initial encounter: Secondary | ICD-10-CM | POA: Diagnosis not present

## 2016-02-12 DIAGNOSIS — B373 Candidiasis of vulva and vagina: Secondary | ICD-10-CM

## 2016-02-12 DIAGNOSIS — E119 Type 2 diabetes mellitus without complications: Secondary | ICD-10-CM | POA: Diagnosis not present

## 2016-02-12 DIAGNOSIS — I10 Essential (primary) hypertension: Secondary | ICD-10-CM | POA: Diagnosis not present

## 2016-02-12 DIAGNOSIS — B3731 Acute candidiasis of vulva and vagina: Secondary | ICD-10-CM

## 2016-02-12 DIAGNOSIS — Y939 Activity, unspecified: Secondary | ICD-10-CM | POA: Diagnosis not present

## 2016-02-12 MED ORDER — FLUCONAZOLE 150 MG PO TABS: 150.0000 mg | ORAL_TABLET | Freq: Once | ORAL | 0 refills | Status: AC

## 2016-02-12 NOTE — Progress Notes (Signed)
61 y.o. Single Caucasian female [name] here with complaint of vaginal symptoms of itching, burning, and no increase in  discharge. Describes discharge as white, no odor. Uses coconut oil for vaginal dryness and external skin dryness. Patient has not used any of her other medications for itching.. Onset of symptoms 7 days ago. Denies new personal products or vaginal dryness. Continues cornstarch use in groin creases for moisture control. No underwear worn at night. No STD concerns. Urinary symptoms none and no leaking . Patient also had ? Fall in parking lot prior to being seen. She was assisted by [facility] staff to exam room. Patient complaining of knee hurting from hitting knee on asphalt. "Not sure how it happened, but has had fall before and was seen in ER." Patient has neighbors that help her. Not complaining of any there concerns.  ROS: HPI pertinent to above  O:Healthy female WDWN Affect: Anxious , not her usual self, repeating more than usual, orientation x 3  Exam:Left leg: covered with cold washcloth and ice pack, only abrasions noted, no active bleeding, but some edema of area around knee, no bruising of limited movement, right extremity no areas noted of injury Abdomen: soft, non tender Right groin crease:; exudate noted and moisture, wet prep taken Left groin crease normal appearance with cornstarch noted on skin Inguinal Lymph nodes: no enlargement or tenderness Pelvic exam: External genital: normal female, with increase pink in genital area, no lesions or signs of LS, exudate noted, wet prep taken BUS: negative Vagina: scant discharge noted. Ph:4.0  ,Wet prep taken, Cervix: absent Uterus: absent Adnexa: absent bilateral,no masses or fullness noted Rectal area:  Normal appearance, no cracking or exudate   Wet Prep results: KOH, Saline + yeast external and vaginal   A:Normal pelvic exam Yeast vaginitis/dermatitis/vulvitis   P:Discussed findings of yeast in vaginal/vulva and  groin area and etiology. Avoid moist clothes or pads for extended period of time. Coconut Oil use for skin protection as instructed for daily use. Rx Diflucan see order with instructions Patient has hydrocortisone cream use externally for itching. Instructions given. Also continue to dry groin crease area well and dust lightly with cornstarch.  Questions addressed at length. Discussed evaluation for knee and ? Fall. Patient agreeable to EMS evaluation" rather been safe than sorry". Very concerned about welfare of her car while being evaluated. EMS called. CMA and Nurse Manager supporting patient until transported.  Rv prn

## 2016-02-12 NOTE — Telephone Encounter (Signed)
Left detailed message at number provided [phone], okay per ROI. Advised patient that she needs to use coconut oil for skin protection daily, rx for Diflucan has been sent to her pharmacy on file for pick up to take one tablet now and one in five days, may use hydrocortisone cream externally only for itching, and needs to keep the area clean and dry. Advised to return call with any further questions.  P:Discussed findings of yeast in vaginal/vulva and groin area and etiology. Avoid moist clothes or pads for extended period of time. Coconut Oil use for skin protection as instructed for daily use. Rx Diflucan see order with instructions Patient has hydrocortisone cream use externally for itching. Instructions given. Also continue to dry groin crease area well and dust lightly with cornstarch.  Questions addressed at length. Discussed evaluation for knee and ? Fall. Patient agreeable to EMS evaluation" rather been safe than sorry". Very concerned about welfare of her car while being evaluated. EMS called. CMA and Nurse Manager supporting patient until transported.  Rv prn  Routing to provider for final review. Patient agreeable to disposition. Will close encounter.

## 2016-02-12 NOTE — ED Triage Notes (Signed)
Per [facility] pt from [facility] health clinic , had fall at the clinic, fell on left knee, presents with left knee abrasion. No loc. No bloodthiners Hx MR.

## 2016-02-12 NOTE — Telephone Encounter (Signed)
Left message to call Kaitlyn at [phone]. 

## 2016-02-12 NOTE — Discharge Instructions (Signed)
Please read attached information. If you experience any new or worsening signs or symptoms please return to the emergency room for evaluation. Please follow-up with your primary care provider or specialist as discussed.  °

## 2016-02-12 NOTE — Progress Notes (Signed)
1215: Per patient request, call to Linda Thompson patient's neighbor and listed on DPR, Left message notifing Linda Siad of patient fall and transfer to [employer] for evaluation. Since unable to reach Linda Siad, also call to patient attorney office and spoke to [place] since Rush Landmark is out of office. Status update provided to [place]. She will follow-up with patient to assist with trasnportation arrangements when discharged. Linda Thompson is advised patient's car in here in parking lot, locked and walker is in backseat per patient instructions. Patient has keys. Royal Hawthorn CMA was with me to put items in car and lock car for patient.   1650: Linda Thompson called back for update. Advised patient was discharged and took a taxi to our office and has been in to office to speak to triage nurse. She has now left for home.

## 2016-02-12 NOTE — Patient Instructions (Addendum)
Vaginal Yeast infection, Adult Vaginal yeast infection is a condition that causes soreness, swelling, and redness (inflammation) of the vagina. It also causes vaginal discharge. This is a common condition. Some women get this infection frequently. What are the causes? This condition is caused by a change in the normal balance of the yeast (candida) and bacteria that live in the vagina. This change causes an overgrowth of yeast, which causes the inflammation. What increases the risk? This condition is more likely to develop in:  Women who take antibiotic medicines.  Women who have diabetes.  Women who take birth control pills.  Women who are pregnant.  Women who douche often.  Women who have a weak defense (immune) system.  Women who have been taking steroid medicines for a long time.  Women who frequently wear tight clothing. What are the signs or symptoms? Symptoms of this condition include:  White, thick vaginal discharge.  Swelling, itching, redness, and irritation of the vagina. The lips of the vagina (vulva) may be affected as well.  Pain or a burning feeling while urinating.  Pain during sex. How is this diagnosed? This condition is diagnosed with a medical history and physical exam. This will include a pelvic exam. Your health care provider will examine a sample of your vaginal discharge under a microscope. Your health care provider may send this sample for testing to confirm the diagnosis. How is this treated? This condition is treated with medicine. Medicines may be over-the-counter or prescription. You may be told to use one or more of the following:  Medicine that is taken orally.  Medicine that is applied as a cream.  Medicine that is inserted directly into the vagina (suppository). Follow these instructions at home:  Take or apply over-the-counter and prescription medicines only as told by your health care provider.  Do not have sex until your health care  provider has approved. Tell your sex partner that you have a yeast infection. That person should go to his or her health care provider if he or she develops symptoms.  Do not wear tight clothes, such as pantyhose or tight pants.  Avoid using tampons until your health care provider approves.  Eat more yogurt. This may help to keep your yeast infection from returning.  Try taking a sitz bath to help with discomfort. This is a warm water bath that is taken while you are sitting down. The water should only come up to your hips and should cover your buttocks. Do this 3-4 times per day or as told by your health care provider.  Do not douche.  Wear breathable, cotton underwear.  If you have diabetes, keep your blood sugar levels under control. Contact a health care provider if:  You have a fever.  Your symptoms go away and then return.  Your symptoms do not get better with treatment.  Your symptoms get worse.  You have new symptoms.  You develop blisters in or around your vagina.  You have blood coming from your vagina and it is not your menstrual period.  You develop pain in your abdomen. This information is not intended to replace advice given to you by your health care provider. Make sure you discuss any questions you have with your health care provider. Document Released: 10/16/2004 Document Revised: 06/20/2015 Document Reviewed: 07/10/2014 Elsevier Interactive Patient Education  2017 [employer]   Use Hydrocortisone as instructed on groin area and external genital area. Take Rx as directed. Apply cornstarch to groin creases for protection.Marland Kitchen

## 2016-02-12 NOTE — Telephone Encounter (Signed)
Patient called again and states she wants to know what medication Jackelyn Poling wants her to take that she has at home.  States she is still at the hospital and to leave it on her voicemail.

## 2016-02-12 NOTE — Telephone Encounter (Signed)
Patient called and says she is at the hospital and wants to speak with someone.  No other info given.

## 2016-02-12 NOTE — ED Provider Notes (Signed)
[place] DEPT Provider Note   CSN: [id number] Arrival date & time: 02/12/16  1157  By signing my name below, I, Higinio Plan, attest that this documentation has been prepared under the direction and in the presence of [employer], PA-C . Electronically Signed: Higinio Plan, Scribe. 02/12/2016. 3:31 PM.  History   Chief Complaint Chief Complaint  Patient presents with  . Fall   The history is provided by the patient. No language interpreter was used.   HPI Comments: Linda Thompson is a 61 y.o. female with PMHx of  HTN, DM2, and moderate intellectual disabilities, brought in by EMS to the Emergency Department for an evaluation of a sudden onset, abrasion to her left knee that occurred this afternoon. Pt reports she was using her walker to ambulate in the parking lot of [facility] this afternoon when she suddenly "dropped her pocketbook" and lost her balance, causing her to fall onto her left knee. She states she "called for help" and 3 nurses came out to help her and EMS was called. She notes she is able to ambulate without difficulty and denies any head injury or loss of consciousness during her fall.   Past Medical History:  Diagnosis Date  . Allergic rhinitis   . Allergy   . Anemia   . Chronic low back pain   . DDD (degenerative disc disease), lumbar   . GERD (gastroesophageal reflux disease)    w/ hx diaphragmatic hernia  . Glaucoma   . GLAUCOMA NOS 03/11/2006   Qualifier: Diagnosis of  By: Diona Browner MD, Amy    . Heart murmur   . Hx of fracture    R arm, foot  . Hyperlipemia   . Hypertension   . IBS (irritable bowel syndrome)   . Low back pain 11/22/2012  . Meningioma ([place])    ant falx, stable on prior imaging  . Moderate intellectual disabilities   . Murmur   . Obesity   . Osteoarthrosis, unspecified whether generalized or localized, ankle and foot   . Sarcoidosis ([place])    difuse bony lesions and LDA, dx by biopsy in 2016  . Transaminitis   .  Type II or unspecified type diabetes mellitus without mention of complication, not stated as uncontrolled     Patient Active Problem List   Diagnosis Date Noted  . Risk for falls 12/25/2015  . Arthritis 12/03/2015  . Sarcoidosis ([place]) 10/27/2014  . Obesity, morbid ([place]) 05/05/2011  . Irritable bowel syndrome 03/05/2010  . Diabetes ([place]) 08/13/2007  . ALLERGIC  RHINITIS 09/23/2006  . Hyperlipemia 03/11/2006  . MENTAL RETARDATION, MODERATE 03/11/2006  . GLAUCOMA NOS 03/11/2006  . Essential hypertension 03/11/2006  . GERD 03/11/2006  . MENINGIOMA 12/20/2001    Past Surgical History:  Procedure Laterality Date  . CATARACT EXTRACTION    . COLONOSCOPY  04/15/2011  . ELBOW SURGERY     right elbow  . LIPOMA EXCISION Left [id number]   posterior left axilla  . TOTAL ABDOMINAL HYSTERECTOMY  03/05/1992   leiomyoma and cellular leiomyoma    OB History    Gravida Para Term Preterm AB Living   0 0 0 0 0 0   SAB TAB Ectopic Multiple Live Births   0 0 0 0       Home Medications    Prior to Admission medications   Medication Sig Start Date End Date Taking? Authorizing Provider  acetaminophen (TYLENOL) 500 MG tablet Take 500 mg by mouth as needed (left ear  pain). Reported on 04/18/2015    Historical Provider, MD  diltiazem (CARDIZEM CD) 240 MG 24 hr capsule TAKE ONE CAPSULE BY MOUTH EVERY DAY 06/21/15   [name], DO  ferrous sulfate 325 (65 FE) MG tablet TAKE 1 TABLET (325 MG TOTAL) BY MOUTH 2 (TWO) TIMES DAILY WITH A MEAL. 02/11/16   Manus Gunning, MD  fluconazole (DIFLUCAN) 150 MG tablet Take 1 tablet (150 mg total) by mouth once. Take one tablet today. Take the second tablet in 5 days. 02/12/16 02/12/16  Regina Eck, CNM  hydrochlorothiazide (MICROZIDE) 12.5 MG capsule TAKE 1 CAPSULE BY MOUTH ONCE DAILY 02/11/16   [name], DO  hydrocortisone 2.5 % ointment APPLY TO AFFECTED AREA TWICE A DAY 05/01/15   Historical Provider, MD  ipratropium (ATROVENT) 0.06 % nasal spray  PLACE 2 SPRAYS INTO BOTH NOSTRILS 4 (FOUR) TIMES DAILY. 06/19/15   [name], DO  ketoconazole (NIZORAL) 2 % shampoo APPLY TO AFFECTED AREA TWICE A WEEK AS DIRECTED 02/11/16   [name], DO  Lactobacillus [facility] ACIDOPHILUS PO) Take 1 tablet by mouth daily.    Historical Provider, MD  latanoprost (XALATAN) 0.005 % ophthalmic solution Place 1 drop into both eyes at bedtime.  04/10/11   Historical Provider, MD  liver oil-zinc oxide (DESITIN) 40 % ointment Apply 1 application topically as needed for irritation.    Historical Provider, MD  loperamide (IMODIUM) 2 MG capsule Take 2 mg by mouth as needed for diarrhea or loose stools (PRN diarrhea).    Historical Provider, MD  loratadine (CLARITIN) 10 MG tablet Take 10 mg by mouth daily.    Historical Provider, MD  metFORMIN (GLUCOPHAGE) 500 MG tablet TAKE 1 TABLET BY MOUTH TWICE A DAY WITH A MEAL 04/23/15   [name], DO  omeprazole (PRILOSEC) 20 MG capsule TAKE 1 CAPSULE (20 MG TOTAL) BY MOUTH 2 (TWO) TIMES DAILY. 04/23/15   [name], DO  potassium chloride SA (KLOR-CON M20) 20 MEQ tablet Take 1 tablet (20 mEq total) by mouth daily. 09/18/15   [name], DO  pravastatin (PRAVACHOL) 40 MG tablet TAKE 1 TABLET BY MOUTH EVERY EVENING 11/02/15   [name], DO  Propylene Glycol (SYSTANE BALANCE) 0.6 % SOLN     Historical Provider, MD  traMADol (ULTRAM) 50 MG tablet Take 1 tablet (50 mg total) by mouth daily. 11/20/15   Marybelle Killings, MD    Family History Family History  Problem Relation Age of Onset  . Diabetes Mother   . Stroke Mother   . Hypertension Mother   . Stroke Father   . Diabetes Maternal Aunt   . Heart disease Maternal Aunt      (had pacemaker)  . Diabetes Maternal Grandmother   . Stroke Maternal Grandmother   . Hypertension Maternal Grandmother   . Colon cancer Neg Hx     Social History Social History  Substance Use Topics  . Smoking status: Never Smoker  . Smokeless tobacco: Never Used  . Alcohol use No    Allergies   Ace inhibitors; Amoxicillin-pot clavulanate; Augmentin [amoxicillin-pot clavulanate]; Azithromycin; Ceftin [cefuroxime axetil]; Cefuroxime; Cyclobenzaprine hcl; Flexeril [cyclobenzaprine]; Flonase [fluticasone propionate]; Fluticasone; and Ibuprofen  Review of Systems Review of Systems  Musculoskeletal: Negative for arthralgias.  Skin:       +abrasion to left knee  Neurological: Negative for syncope.   Physical Exam Updated Vital Signs BP (!) 135/105 (BP Location: Right Arm)   Pulse 77   Temp  98.7 F (37.1 C) (Oral)   Resp 20   LMP 01/21/1992 (Approximate)   SpO2 99%   Physical Exam  Constitutional: She is oriented to person, place, and time. She appears well-developed and well-nourished.  HENT:  Head: Normocephalic.  Eyes: EOM are normal.  Neck: Normal range of motion.  Pulmonary/Chest: Effort normal.  Abdominal: She exhibits no distension.  Musculoskeletal: Normal range of motion. She exhibits no tenderness.  No TTP of anterior knee or patella. Superficial abrasions. No significant laxity.   Neurological: She is alert and oriented to person, place, and time.  Psychiatric: She has a normal mood and affect.  Nursing note and vitals reviewed.  ED Treatments / Results  DIAGNOSTIC STUDIES:  Oxygen Saturation is 99% on RA, normal by my interpretation.    COORDINATION OF CARE:  3:19 PM Discussed treatment plan with pt at bedside and pt agreed to plan.  Procedures Procedures (including critical care time)  Initial Impression / Assessment and Plan / ED Course  I have reviewed the triage vital signs and the nursing notes.  Pertinent labs & imaging results that were available during my care of the patient were reviewed by me and considered in my medical decision making (see chart for details).  Labs:   Imaging:   Consults:   Therapeutics:   Discharge Meds:  Assessment/Plan:   61 year old female status post fall. She has superficial abrasions to  the knee. She has no bony tenderness, no pain with range of motion, ambulatory without difficulty. She'll be discharged home with symptomatic care instructions and return precautions. She verbalized understanding and agreement to today's plan had no further questions or concerns  Final Clinical Impressions(s) / ED Diagnoses   Final diagnoses:  Fall, initial encounter  Abrasion    New Prescriptions Discharge Medication List as of 02/12/2016  3:43 PM    START taking these medications   Details  fluconazole (DIFLUCAN) 150 MG tablet Take 1 tablet (150 mg total) by mouth once. Take one tablet today. Take the second tablet in 5 days., Starting Tue 02/12/2016, Normal         Okey Regal, PA-C 02/12/16 1916    Duffy Bruce, MD 02/14/16 [phone]

## 2016-02-14 ENCOUNTER — Ambulatory Visit: Payer: Medicare Other | Admitting: Family Medicine

## 2016-02-14 ENCOUNTER — Encounter: Payer: Self-pay | Admitting: Podiatry

## 2016-02-14 ENCOUNTER — Ambulatory Visit (INDEPENDENT_AMBULATORY_CARE_PROVIDER_SITE_OTHER): Payer: Medicare Other | Admitting: Podiatry

## 2016-02-14 VITALS — Ht 62.0 in | Wt 193.0 lb

## 2016-02-14 DIAGNOSIS — L600 Ingrowing nail: Secondary | ICD-10-CM

## 2016-02-14 DIAGNOSIS — E1140 Type 2 diabetes mellitus with diabetic neuropathy, unspecified: Secondary | ICD-10-CM

## 2016-02-14 NOTE — Progress Notes (Signed)
Encounter reviewed [name], MD   

## 2016-02-14 NOTE — Progress Notes (Signed)
Subjective:     Patient ID: Linda Thompson, female   DOB: 07-06-1955, 61 y.o.   MRN: [id number]  HPI this patient presents to the office with chief complaint of pain and bleeding from the big toe left foot. She states that it started hurting due to the ingrown toenail left foot. She presents the office today for an evaluation and treatment of this problem   Review of Systems     Objective:   Physical Exam neurovascular status is intact. There is marked incurvation noted along the lateral border of the left great.      Assessment:    Ingrown nail left hallux    Plan:     ROV  Debridement of ingrown toenail.  RTC 10 weeks.  Gardiner Barefoot DPM

## 2016-02-15 ENCOUNTER — Telehealth: Payer: Self-pay | Admitting: Certified Nurse Midwife

## 2016-02-15 NOTE — Telephone Encounter (Signed)
Spoke with patient. Patient states that she picked up her rx for Diflucan on Wednesday 02/13/2016. Took 1 tablet. Patient has Hydrocortisone at home that expires in 12/2016. Advised okay to use. May apply as needed for itching externally. Patient is agreeable. Aware she may take second tablet of Diflucan on 02/18/2016. States she is resting at home and feels better after fall on 02/12/2016. Patient is keeping her knee clean and applying neosporin as needed. States she has been using her walker less when outside to avoid falls. Reports she was taught how to use the walker and place the brakes on by a therapist. Advised if she has trouble she may want to have someone review use of the device with her for her safety. Patient is agreeable. 27 minute phone call with patient.  Routing to provider for final review. Patient agreeable to disposition. Will close encounter.

## 2016-02-15 NOTE — Telephone Encounter (Signed)
Agree with plan 

## 2016-02-15 NOTE — Telephone Encounter (Signed)
Patient would like to speak with nurse about her medication. °

## 2016-02-17 NOTE — Progress Notes (Signed)
HPI:  Linda Thompson is a pleasant 61 yo with a PMH, mental retardation, DM, HTN, HLD ,GERD, Sarcoidosis, gastric polyps and morbid obesity here for follow up. She seeks medical care frequently as has a high level of anxiety regarding her health. She went to the ER on 1/23 after dropping her pocket book with subsequent mild tumble and knee abrasion. She had physical therapy which she feels was very helpful and now feels steady with her walker and safe. She is using caution with ambulation. She wants to check her blood sugar. Denies vision changes, weakness, pain, bleeding, dizziness, CP, SOB.  ROS: See pertinent positives and negatives per HPI.  Past Medical History:  Diagnosis Date  . Allergic rhinitis   . Allergy   . Anemia   . Chronic low back pain   . DDD (degenerative disc disease), lumbar   . GERD (gastroesophageal reflux disease)    w/ hx diaphragmatic hernia  . Glaucoma   . GLAUCOMA NOS 03/11/2006   Qualifier: Diagnosis of  By: Diona Browner MD, Amy    . Heart murmur   . Hx of fracture    R arm, foot  . Hyperlipemia   . Hypertension   . IBS (irritable bowel syndrome)   . Low back pain 11/22/2012  . Meningioma ([place])    ant falx, stable on prior imaging  . Moderate intellectual disabilities   . Murmur   . Obesity   . Osteoarthrosis, unspecified whether generalized or localized, ankle and foot   . Sarcoidosis ([place])    difuse bony lesions and LDA, dx by biopsy in 2016  . Transaminitis   . Type II or unspecified type diabetes mellitus without mention of complication, not stated as uncontrolled     Past Surgical History:  Procedure Laterality Date  . CATARACT EXTRACTION    . COLONOSCOPY  04/15/2011  . ELBOW SURGERY     right elbow  . LIPOMA EXCISION Left [id number]   posterior left axilla  . TOTAL ABDOMINAL HYSTERECTOMY  03/05/1992   leiomyoma and cellular leiomyoma    Family History  Problem Relation Age of Onset  . Diabetes Mother   . Stroke Mother   . Hypertension  Mother   . Stroke Father   . Diabetes Maternal Aunt   . Heart disease Maternal Aunt      (had pacemaker)  . Diabetes Maternal Grandmother   . Stroke Maternal Grandmother   . Hypertension Maternal Grandmother   . Colon cancer Neg Hx     Social History   Social History  . Marital status: Single    Spouse name: N/A  . Number of children: 0  . Years of education: N/A   Occupational History  . Disabled (from arm fracture)    Social History Main Topics  . Smoking status: Never Smoker  . Smokeless tobacco: Never Used  . Alcohol use No  . Drug use: No  . Sexual activity: Not Currently    Birth control/ protection: Surgical     Comment: Hyst--TAH--unsure if has ovaries   Other Topics Concern  . None   Social History Narrative   Lives at [facility] (on [address] in [place])      [place] for arm fracture      Mental Retardation, but is able to drive to places she is comfortable with and prepare her own meals and manage most of her own affairs.      [name] (neighbor) is POA/HCPOA  Reports she gets regular exercise and tries to eat healthy     Current Outpatient Prescriptions:  .  acetaminophen (TYLENOL) 500 MG tablet, Take 500 mg by mouth as needed (left ear pain). Reported on 04/18/2015, Disp: , Rfl:  .  diltiazem (CARDIZEM CD) 240 MG 24 hr capsule, TAKE ONE CAPSULE BY MOUTH EVERY DAY, Disp: 90 capsule, Rfl: 1 .  fluticasone (FLONASE) 50 MCG/ACT nasal spray, Place into both nostrils daily., Disp: , Rfl:  .  hydrochlorothiazide (MICROZIDE) 12.5 MG capsule, TAKE 1 CAPSULE BY MOUTH ONCE DAILY, Disp: 90 capsule, Rfl: 1 .  hydrocortisone 2.5 % ointment, APPLY TO AFFECTED AREA TWICE A DAY, Disp: , Rfl: 0 .  ipratropium (ATROVENT) 0.06 % nasal spray, PLACE 2 SPRAYS INTO BOTH NOSTRILS 4 (FOUR) TIMES DAILY., Disp: 45 mL, Rfl: 3 .  ketoconazole (NIZORAL) 2 % shampoo, APPLY TO AFFECTED AREA TWICE A WEEK AS DIRECTED, Disp: 120 mL,  Rfl: 1 .  Lactobacillus (FLORAJEN ACIDOPHILUS PO), Take 1 tablet by mouth daily., Disp: , Rfl:  .  latanoprost (XALATAN) 0.005 % ophthalmic solution, Place 1 drop into both eyes at bedtime. , Disp: , Rfl:  .  liver oil-zinc oxide (DESITIN) 40 % ointment, Apply 1 application topically as needed for irritation., Disp: , Rfl:  .  loratadine (CLARITIN) 10 MG tablet, Take 10 mg by mouth daily., Disp: , Rfl:  .  metFORMIN (GLUCOPHAGE) 500 MG tablet, TAKE 1 TABLET BY MOUTH TWICE A DAY WITH A MEAL, Disp: 180 tablet, Rfl: 1 .  omeprazole (PRILOSEC) 20 MG capsule, TAKE 1 CAPSULE (20 MG TOTAL) BY MOUTH 2 (TWO) TIMES DAILY., Disp: 180 capsule, Rfl: 3 .  potassium chloride SA (KLOR-CON M20) 20 MEQ tablet, Take 1 tablet (20 mEq total) by mouth daily., Disp: 90 tablet, Rfl: 3 .  pravastatin (PRAVACHOL) 40 MG tablet, TAKE 1 TABLET BY MOUTH EVERY EVENING, Disp: 90 tablet, Rfl: 1 .  Propylene Glycol (SYSTANE BALANCE) 0.6 % SOLN, , Disp: , Rfl:   EXAM:  Vitals:   02/18/16 0930  BP: 122/80  Pulse: 93  Temp: 98 F (36.7 C)    Body mass index is 33.71 kg/m.  GENERAL: vitals reviewed and listed above, alert, oriented, appears well hydrated and in no acute distress  HEENT: atraumatic, conjunttiva clear, no obvious abnormalities on inspection of external nose and ears  NECK: no obvious masses on inspection  LUNGS: clear to auscultation bilaterally, no wheezes, rales or rhonchi, good air movement  CV: HRRR, no peripheral edema  MS: moves all extremities without noticeable abnormality  SKIN: very minor superficial healing abrasion L knee, no signs infection  PSYCH: pleasant and cooperative, no obvious depression or anxiety  FOOT EXAM: normal  ASSESSMENT AND PLAN:  Discussed the following assessment and plan:  Type 2 diabetes mellitus without complication, without long-term current use of insulin (HCC) - Plan: Hemoglobin A1c  Hyperlipidemia, unspecified hyperlipidemia type  Essential  hypertension - Plan: BMP with eGFR  MENTAL RETARDATION, MODERATE  Obesity, morbid ([place])  Sarcoidosis ([place])  -doing well -labs today -lifestyle recs -foot exam done -abrasion looks good - she can apply some abx ointment if itchy, discussed -follow up 3 months -Patient advised to return or notify a doctor immediately if symptoms worsen or persist or new concerns arise.  Patient Instructions  BEFORE YOU LEAVE: -labs -follow up: 3 months  We have ordered labs or studies at this visit. It can take up to 1-2 weeks for results and processing. IF results require follow up  or explanation, we will call you with instructions. Clinically stable results will be released to your [facility]. If you have not heard from [place] or cannot find your results in [facility] in 2 weeks please contact our office at [phone].  If you are not yet signed up for [facility], please consider signing up.  Eat healthy - lots of steamed or fresh vegetables and fruits, baked fish or chicken and whole grains.  Get regular exercise - at least 150 minutes per week.         [name] R., DO

## 2016-02-18 ENCOUNTER — Encounter: Payer: Self-pay | Admitting: Family Medicine

## 2016-02-18 ENCOUNTER — Ambulatory Visit (INDEPENDENT_AMBULATORY_CARE_PROVIDER_SITE_OTHER): Payer: Medicare Other | Admitting: Family Medicine

## 2016-02-18 ENCOUNTER — Telehealth: Payer: Self-pay | Admitting: Certified Nurse Midwife

## 2016-02-18 VITALS — BP 122/80 | HR 93 | Temp 98.0°F | Ht 62.0 in | Wt 184.3 lb

## 2016-02-18 DIAGNOSIS — I10 Essential (primary) hypertension: Secondary | ICD-10-CM | POA: Diagnosis not present

## 2016-02-18 DIAGNOSIS — E119 Type 2 diabetes mellitus without complications: Secondary | ICD-10-CM

## 2016-02-18 DIAGNOSIS — F71 Moderate intellectual disabilities: Secondary | ICD-10-CM

## 2016-02-18 DIAGNOSIS — E785 Hyperlipidemia, unspecified: Secondary | ICD-10-CM

## 2016-02-18 DIAGNOSIS — D869 Sarcoidosis, unspecified: Secondary | ICD-10-CM

## 2016-02-18 LAB — BASIC METABOLIC PANEL
BUN: 10 mg/dL (ref 6–23)
CALCIUM: 9 mg/dL (ref 8.4–10.5)
CO2: 29 meq/L (ref 19–32)
Chloride: 104 mEq/L (ref 96–112)
Creatinine, Ser: 0.56 mg/dL (ref 0.40–1.20)
GFR: 116.76 mL/min (ref >60.00)
Glucose, Bld: 94 mg/dL (ref 70–99)
Potassium: 3.9 mEq/L (ref 3.5–5.1)
SODIUM: 142 meq/L (ref 135–145)

## 2016-02-18 LAB — HEMOGLOBIN A1C: HEMOGLOBIN A1C: 6.2 % (ref 4.6–6.5)

## 2016-02-18 NOTE — Addendum Note (Signed)
Addended by: Denna Haggard K on: 02/18/2016 10:20 AM   Modules accepted: Orders

## 2016-02-18 NOTE — Patient Instructions (Addendum)
BEFORE YOU LEAVE: -labs -follow up: 3 months  We have ordered labs or studies at this visit. It can take up to 1-2 weeks for results and processing. IF results require follow up or explanation, we will call you with instructions. Clinically stable results will be released to your [facility]. If you have not heard from [place] or cannot find your results in [facility] in 2 weeks please contact our office at [phone].  If you are not yet signed up for [facility], please consider signing up.  Eat healthy - lots of steamed or fresh vegetables and fruits, baked fish or chicken and whole grains.  Get regular exercise - at least 150 minutes per week.

## 2016-02-18 NOTE — Progress Notes (Signed)
Pre visit review using our clinic review tool, if applicable. No additional management support is needed unless otherwise documented below in the visit note. 

## 2016-02-18 NOTE — Telephone Encounter (Signed)
Spoke with patient. Patient states that she took her second Diflucan tablet today and would like to know if this will help with her intermittent itching. Advised it may take the second dose to help to relieve symptoms and this can take a few days. May continue to use OTC Hydrocortisone cream externally for itching as needed. Needs to ensure she is keeping the area clean and dry. Patient is agreeable and verbalizes understanding 25 minute phone call with patient.  Routing to provider for final review. Patient agreeable to disposition. Will close encounter.

## 2016-02-18 NOTE — Telephone Encounter (Signed)
Patient called requesting to speak with the nurse.  She said, "I'm still having itching down there."

## 2016-02-19 ENCOUNTER — Telehealth: Payer: Self-pay | Admitting: Certified Nurse Midwife

## 2016-02-19 NOTE — Telephone Encounter (Signed)
Patient wants to speak with Kaitlyn. No information given. °

## 2016-02-19 NOTE — Telephone Encounter (Signed)
Spoke with patient. Patient is calling to check to see if there is any need to make a follow up appointment at this time. Patient was seen on 02/12/2016 for yeast infection with [name]. Patient took Diflucan 150 mg on 02/13/2016. Took second Diflucan tablet on 02/18/2016. Patient states she is having intermittent itching, but feels much better. Advised at this time she does not need to make a follow up appointment as the second Diflucan is still helping to clear yeast. Advised she may continue to use OTC Hydrocortisone cream externally as needed. Patient is agreeable. Aware of the need to keep the vaginal area clean and dry. Will call if symptoms worsen or continue by the end of this week.  Routing to provider for final review. Patient agreeable to disposition. Will close encounter.

## 2016-02-21 ENCOUNTER — Ambulatory Visit (INDEPENDENT_AMBULATORY_CARE_PROVIDER_SITE_OTHER): Payer: Medicare Other | Admitting: Certified Nurse Midwife

## 2016-02-21 ENCOUNTER — Other Ambulatory Visit: Payer: Self-pay

## 2016-02-21 ENCOUNTER — Telehealth: Payer: Self-pay | Admitting: Certified Nurse Midwife

## 2016-02-21 ENCOUNTER — Encounter: Payer: Self-pay | Admitting: Certified Nurse Midwife

## 2016-02-21 VITALS — Ht 62.0 in | Wt 184.0 lb

## 2016-02-21 DIAGNOSIS — B3731 Acute candidiasis of vulva and vagina: Secondary | ICD-10-CM

## 2016-02-21 DIAGNOSIS — B373 Candidiasis of vulva and vagina: Secondary | ICD-10-CM

## 2016-02-21 NOTE — Patient Instructions (Addendum)
You have Monistat cream at home, use this cream every night at bedtime after shower for 5 days, then stop using Monistat.. Use coconut oil in morning for 5 days, then go back to using coconut oil at night.

## 2016-02-21 NOTE — Telephone Encounter (Signed)
Spoke with patient. Patient states that she is still having recurring vaginal itching. Has taken 2 doses of Diflucan. Last dose was taken on 02/18/2016. Is using OTC Hydrocortisone cream externally as needed for itching. Patient states she is keeping the area clean and dry. Is worried due to continued itching. Advised appointment will be needed for recheck. Patient is agreeable. Appointment scheduled for 02/22/2016 at 10 am with [name]. Patient is agreeable to date and time.  Routing to provider for final review. Patient agreeable to disposition. Will close encounter.

## 2016-02-21 NOTE — Telephone Encounter (Signed)
Spoke with patient at time of incoming call. Patient states she is using coconut oil daily for dryness. Reports she is using Hydrocortisone as needed and is not using it daily at this time. Patient is very concerned as her vaginal area is now sore. Requesting to move appointment forward. Appointment moved to today 02/21/2016 at 2 pm with [name]. Patient is agreeable to date and time.  Routing to provider for final review. Patient agreeable to disposition. Will close encounter.

## 2016-02-21 NOTE — Patient Outreach (Signed)
[facility]) Care Management  02/21/2016  Linda Thompson 13-Oct-1955 [id number]   Telephonic Monthly Assessment   Referral Date:  11/27/2015 Screening and Initial Assessment 11/29/2015.   H/o high utilization of services:  H/o patient scheduling appointment for minor concerns.  THN to act as liaison between patient at home and scheduling appointments.   Patient prefers to see her MD because she understands better when having a face to face visit Insurance:  Medicare   Subjective:   Patient completed call.  [facility] [address] [place], [place] [address] [phone] (H)  Providers: Primary MD: Dr. [name], DO  -  last appt: 02/18/16 - next appt 05/19/16 Podiatrist:  Dr. Gardiner Barefoot - last appt 02/14/16 Gynecologist: Dr. Hale Bogus and Melvia Heaps, CNM - last appt - 02/12/16 and several telephone calls since that date.  Next appt 02/22/16 for follow-up on vaginal itching.  Orthopedist: Dr. Lorin Mercy - last appt 01/2016 Dentist:  Next appt 02/2016 HH:  PT - [facility]  # [phone]  (services completed 01/2016)  Psycho/Social: Patient with moderate mental retardation and  lives in [address] apartment alone.  Parents are deceased.  Patient has HCPOA and attorney who manages her care.  Caregiver: [name] [phone] POA:  Grace Bushy Ph: [phone] / Fax: [phone] (takes care of paying all of patients bills).  [address], [place] [address]  Mobility:  Weakness in left leg improved.  Patient walking without walker. Falls: (2)  12/11/15 and 02/12/16 Last fall 02/12/16 at the [facility]   Patient fell on left knee and sent to ED.  (left knee abrasion).  Pt reports she was using her walker to ambulate in the parking lot of [facility] this afternoon when she suddenly "dropped her pocketbook" and lost her balance, causing her to fall onto her left knee. Patient "called for  help" and 3 nurses came out to help her and EMS was called.  Patient denies any ambulation problems or pain this call. Pain: arthritis in Left hip. Depression: none Transportation:  Patient not driving since fall on 12/11/15.  Transportation provided by caregiver, Ms [name]. [phone].  Advance Directive: Ms [name] would make decisions.  Consent:  yes DME:  Cane (4 pronged),  Rolator, eyeglasses, partial lower dental plate.   Co-morbidities:  Diabetes Type 2, Essential hypertension, GERD, IBS, Meningioma, Mental Retardation (Moderate); Glaucoma, Obesity, morbid; Sarcoidosis with positive PET scan for lymph node enlargement.  Admissions: 0 ER visits: 2  Diabetes Type 2: Per POC verification with Dr. Maudie Mercury:  "Thank you for your care. She is not on insulin, so does not need to routinely monitor home glucose. Working on a healthy diet would be great! Thanks."    Glucometer in the home:  None Weight:  184 down from 193 over the past month.   States eating more vegetables. Patient continues to eat canned / high sodium foods but states she does not eat any salt.     A1C 6.700 08/14/2015  (down to 6.2 on 02/18/2016  Medications:  Prevnar (PCV13) N/D Pneumovax (PP 02/08/2009 Flu Vaccine 08/31/2015 tDAP Vaccine 08/08/2014 Pharmacy:  [facility] and picks medications up herself  Encounter Medications:  Outpatient Encounter Prescriptions as of 02/21/2016  Medication Sig  . acetaminophen (TYLENOL) 500 MG tablet Take 500 mg by mouth as needed (left ear pain). Reported on 04/18/2015  . diltiazem (CARDIZEM CD) 240 MG 24  hr capsule TAKE ONE CAPSULE BY MOUTH EVERY DAY  . ferrous sulfate 325 (65 FE) MG tablet TAKE 1 TABLET (325 MG TOTAL) BY MOUTH 2 (TWO) TIMES DAILY WITH A MEAL.  . fluticasone (FLONASE) 50 MCG/ACT nasal spray Place into both nostrils daily.  . hydrochlorothiazide (MICROZIDE) 12.5 MG capsule TAKE 1 CAPSULE BY MOUTH ONCE DAILY  . hydrocortisone (ANUSOL-HC) 25 MG suppository UNWRAP AND INSERT  1 SUPPOSITORY IN THE RECTUM TWICE A DAY  . hydrocortisone 2.5 % ointment APPLY TO AFFECTED AREA TWICE A DAY  . ipratropium (ATROVENT) 0.06 % nasal spray PLACE 2 SPRAYS INTO BOTH NOSTRILS 4 (FOUR) TIMES DAILY.  Marland Kitchen ketoconazole (NIZORAL) 2 % shampoo APPLY TO AFFECTED AREA TWICE A WEEK AS DIRECTED  . Lactobacillus (FLORAJEN ACIDOPHILUS PO) Take 1 tablet by mouth daily.  Marland Kitchen latanoprost (XALATAN) 0.005 % ophthalmic solution Place 1 drop into both eyes at bedtime.   Marland Kitchen liver oil-zinc oxide (DESITIN) 40 % ointment Apply 1 application topically as needed for irritation.  Marland Kitchen loratadine (CLARITIN) 10 MG tablet Take 10 mg by mouth daily.  . metFORMIN (GLUCOPHAGE) 500 MG tablet TAKE 1 TABLET BY MOUTH TWICE A DAY WITH A MEAL  . omeprazole (PRILOSEC) 20 MG capsule TAKE 1 CAPSULE (20 MG TOTAL) BY MOUTH 2 (TWO) TIMES DAILY.  Marland Kitchen potassium chloride SA (KLOR-CON M20) 20 MEQ tablet Take 1 tablet (20 mEq total) by mouth daily.  . pravastatin (PRAVACHOL) 40 MG tablet TAKE 1 TABLET BY MOUTH EVERY EVENING  . Propylene Glycol (SYSTANE BALANCE) 0.6 % SOLN   . traMADol (ULTRAM) 50 MG tablet Take 50 mg by mouth daily.   No facility-administered encounter medications on file as of 02/21/2016.     Functional Status:  In your present state of health, do you have any difficulty performing the following activities: 02/21/2016 01/25/2016  Hearing? N N  Vision? N N  Difficulty concentrating or making decisions? N N  Walking or climbing stairs? N Y  Dressing or bathing? N N  Doing errands, shopping? N Y  Conservation officer, nature and eating ? N -  Using the Toilet? N -  In the past six months, have you accidently leaked urine? N -  Do you have problems with loss of bowel control? N -  Managing your Medications? N -  Managing your Finances? N -  Housekeeping or managing your Housekeeping? N -  Some recent data might be hidden    Fall/Depression Screening: PHQ 2/9 Scores 02/21/2016 01/30/2016 01/28/2016 01/25/2016 01/03/2016 12/25/2015 12/11/2015   PHQ - 2 Score 0 0 0 0 0 0 0    Fall Risk  02/21/2016 02/21/2016 01/28/2016 01/25/2016 12/25/2015  Falls in the past year? Yes Yes No (No Data) Yes  Number falls in past yr: 2 or more _0 Injury with Fall? _1   Risk Factor Category  _2   Risk for fall due to : History of fall(s);Impaired balance/gait History of fall(s);Impaired mobility Impaired balance/gait;Impaired mobility;History of fall(s) History of fall(s);Impaired balance/gait;Impaired mobility History of fall(s);Impaired balance/gait;Impaired mobility  Follow up Education provided;Falls evaluation completed;Falls prevention discussed Falls evaluation completed;Falls prevention discussed;Education provided Falls evaluation completed;Education provided Falls evaluation completed;Education provided;Falls prevention discussed Falls evaluation completed;Education provided;Falls prevention discussed    Preventives: Hearing: none- hearing issues  Eyes: Dr. 10/2015 per patient  DM Eye Exam: positive retin 05/29/2015 DM Foot Exam 02/01/2015 Dentist: no issues and has not seen  in a long time.  Nest appt 02/2016 Bone Density: unknown Colonoscopy 04/18/2015 Mammogram 09/19/2015    Plan:  Referral Date:  11/27/2015 Screening and Initial Assessment 11/29/2015 Telephonic RN CM 11/29/2015 Program:  DM 11/29/2015  H/o High utilization of provider services (improved) RN CM will continue to coach on appropriate use of services.   Diabetes RN CM will continue to provide diet and low salt education.     Emmi Educational Materials:  (reviewed 02/21/2016) -Diabetes Care Checklist -Diabetes: Why Get Your A1C Checked? -Diabetes: Controlling Blood Sugar -Type 2 Diabetes: Taking Care of Yourself Throughout The Year -Counting Carbohydrates -Low-Salt Diet -About High Blood Pressure (Hypertension) -High Blood Pressure (HyperTension): What You Can Do -How to Read A  Food Label Chart -[facility] Calendar  Fall Prevention: New Fall 02/12/15 RN CM reviewed fall prevention and encouraged use of walker as needed.  RN CM advised to continue to work on PT exercises patient has previously been taught 01/2016.   [facility] Referral (12/31/2015 / completed 01/25/16) Resolved   THN SW Referral (12/31/2015 (remains active) -Patient currently in ILF but needs higher level of care to meet needs.  -H/o PACE Referral sent by RN CM on 12/31/2015 SW update 01/28/16: HH and [name] stated that patient will be able to drive herself to this appointment. She also has SCAT in place but has never used service so we cannot provide transportation for her but I will complete home visit this week and educate her on how to schedule SCAT and review other resources again since PACE was too expensive and they were not able to gain services. Booke  RN CM advised in next [facility] scheduled contact call within next 30 days for monthly assessment and care coordination services as needed. RN CM advised to please notify MD of any changes in condition prior to scheduled appt's.   RN CM provided contact name and # [phone] or main office # [phone] and 24-hour nurse line # [phone].  RN CM confirmed patient is aware of 911 services for urgent emergency needs.  [facility] CM Care Plan Problem One   Flowsheet Row Most Recent Value  Care Plan Problem One  Knowledge deficit associate to Diabetes complications.    Role Documenting the Problem One  Care Management Telephonic [place] for Problem One  Active  THN Long Term Goal (31-90 days)  patient will continue to work on modifying her diet to avoid high fat, sodium and carbohydrates over the next 31-90 days.   THN Long Term Goal Start Date  02/21/16  THN Long Term Goal Met Date  02/21/16  Interventions for Problem One Long Term Goal  RN CM will continue to provide diet education over the next 31-90 days.   THN CM Short Term Goal #1 (0-30  days)  Patient will review EMMI educational materials over the next 30 days.   THN CM Short Term Goal #1 Start Date  01/25/16  Interventions for Short Term Goal #1  RN CM will mail Emmi Ed over the next 30 days.   THN CM Short Term Goal #2 (0-30 days)  Patient will continue to monitor weight and work toward weight loss goal of 2 lbs over the next 30 days.   THN CM Short Term Goal #2 Start Date  02/21/16  Interventions for Short Term Goal #2  RN CM will provide education on importance of weight managment as self management intervention for diabetes managmenet over the next 30 days.  [facility] CM Care Plan Problem Two   Flowsheet Row Most Recent Value  Care Plan Problem Two  Fall Prevention   Role Documenting the Problem Two  Care Management Telephonic Coordinator  Care Plan for Problem Two  Active  THN CM Short Term Goal #1 (0-30 days)  Patient will report no falls over the next 30 days.   THN CM Short Term Goal #1 Start Date  02/21/16  [facility] CM Short Term Goal #1 Met Date   02/21/16  Interventions for Short Term Goal #2   RN CM will continue to provide fall prevention education over the next 30 days.     [facility] CM Care Plan Problem Three   Flowsheet Row Most Recent Value  Care Plan Problem Three  Increased utilization of provider services   Role Documenting the Problem Three  Care Management Telephonic Coordinator  Care Plan for Problem Three  Active  THN CM Short Term Goal #1 (0-30 days)  Patient will discuss appt concerns with RN CM as needed over the next 30 days.   THN CM Short Term Goal #1 Start Date  02/21/16  [facility] CM Short Term Goal #1 Met Date  02/21/16  Interventions for Short Term Goal #1  RN CM will continue to monitor utilization of services and provide coaching as needed over the next 30 days.        Nathaneil Canary, BSN, RN, [place] Management Care Management Coordinator [phone] Direct [phone] Cell [phone]  Office [phone] Fax [email or web address]

## 2016-02-21 NOTE — Progress Notes (Signed)
61 y.o. Single Caucasian female [name] here with complaint of vaginal symptoms of itching only that has not resolved after being seen on 02/12/16 for yeast dermatitis and vulvitis. Completed last Diflucan 1 day ago, but still feels "sore in vaginal area". She has been using 1% hydrocortisone cream prn to area for itching. Using cornstarch in groin creases which were red and weeping at last visit. Denies vaginal bleeding or dryness in vaginal area. Showers every other day. Changes wash clothes. Saw PCP for follow up with Type 2 diabetes on 02/18/16, all normal per patient..  Urinary symptoms none . No problems from fall on 02/12/16.  ROS pertinent to HPI  O:Healthy female WDWN Affect: normal, orientation x 3  Exam: Abdomen:soft, non tender  Inguinal Lymph nodes: no enlargement or tenderness Pelvic exam: External genital: normal female, atrophic appearance with no lesions, right vulva known enlargement due to sarcoidosis originating site. Groin creases bilateral, now drying with only slight pink appearance and cornstarch residue noted,non tender Vagina: scant discharge with slight redness noted on inside of introitus. Patient points to this area where she is itching and sore. Scaling noted, no exudate. Wet prep taken of skin only BUS: negative Pelvic exam not done, vaginal only  Wet Prep results: KOH, Saline positive for skin yeast only   A:Yeast vulvitis chronic history Yeast dermatitis chronic history, resolving Mentally challenged, but does well with instructions   P:Discussed findings of yeast vulvitis and to use her Monistat cream to this area at bedtime for 5 nights and stop.. Use coconut oil in am for 5 days and then return to use nightly as before. Use Hydrocortisone cream prn itching, not daily unless needed. Shower daily and change wash cloths each time. Change depends when soiled. Questions addressed. Written instructions given to patient and she repeated instructions  correctly.   Rv prn

## 2016-02-21 NOTE — Telephone Encounter (Signed)
Is she using her coconut oil for dryness now. The cortisone maybe causing itching now.

## 2016-02-21 NOTE — Telephone Encounter (Signed)
Patient wants to speak with the nurse no information given. °

## 2016-02-22 ENCOUNTER — Ambulatory Visit: Payer: Medicare Other | Admitting: Certified Nurse Midwife

## 2016-02-22 ENCOUNTER — Encounter: Payer: Self-pay | Admitting: Licensed Clinical Social Worker

## 2016-02-22 ENCOUNTER — Other Ambulatory Visit: Payer: Self-pay | Admitting: Licensed Clinical Social Worker

## 2016-02-22 NOTE — Patient Outreach (Signed)
Linda Thompson) Care Management  02/22/2016  Linda Thompson 12/04/1955 [id number]   Assessment- CSW completed outreach call to both patient and close friend [name]. Patient reports that she is doing well. Patient states that she had a recent fall at the [facility] and then went to the ED. Patient shares that she still has folder that [place] provided her with all Senior resources, instructions on how to schedule SCAT if ever needed and caregiver resources. [name] reports that she did not feel that patient needed to go to the ED after fall and she was worried she would come down with the flu. Patient reports that she is not experiencing any symptoms of sickness. Patient denies being in any pain at this moment. She reports that her appetite is "good." Patient has started driving again. Patient is driving herself to get groceries and attend medical appointments. Patient's caregiver [name] reports that patient is becoming more independent. Patient's caregiver was unable to assist with patient for 3-4 weeks because her spouse had surgery. [name] reports that patient seems to be doing very well. CSW completed senior resource review with both patient and caregiver. Patient and caregiver deny having any further social work needs at this time but they are agreeable to contact this CSW if they have any questions or concerns in the future.   Plan-CSW will discharge patient from caseload at this time. CSW will send notification to PCP and to [place].   [facility] CM Care Plan Problem One   Flowsheet Row Most Recent Value  Care Plan Problem One  Knowledge deficit in available community resources (personal care, LTC and day programs)  Role Documenting the Problem One  Clinical Social Worker  Care Plan for Problem One  Active  THN Long Term Goal (31-90 days)  Patient will be educated on available community resources such as PACE, ACE, Private Pay caregiver, [place], In Home Aide Services and LTC placement  within 30 days as evidenced by family desire  THN Long Term Goal Start Date  01/01/16  [facility] Long Term Goal Met Date  02/22/16  Interventions for Problem One Long Term Goal  Goal met.  THN CM Short Term Goal #1 (0-30 days)  Family will receive call from PACE within 30 days in order to schedule assessment  THN CM Short Term Goal #1 Start Date  01/03/16  [facility] CM Short Term Goal #1 Met Date  01/28/16  Interventions for Short Term Goal #1  Goal met. However, patient is not able to afford PACE program. She will need to consider another resource to use and CSW will assist with this.  THN CM Short Term Goal #2 (0-30 days)  Patient will be able to schedule transportation arrangements within 30 days per self report to CSWw  [facility] CM Short Term Goal #2 Start Date  01/28/16  [facility] CM Short Term Goal #2 Met Date  02/22/16  Interventions for Short Term Goal #2  Goal met.     [name], BSW, MSW, [email or web address] Phone: [phone] Fax: [phone]

## 2016-02-24 NOTE — Progress Notes (Signed)
Encounter reviewed Linda Tarte, MD   

## 2016-02-26 ENCOUNTER — Ambulatory Visit (INDEPENDENT_AMBULATORY_CARE_PROVIDER_SITE_OTHER): Payer: Medicare Other | Admitting: Certified Nurse Midwife

## 2016-02-26 ENCOUNTER — Telehealth: Payer: Self-pay | Admitting: Certified Nurse Midwife

## 2016-02-26 ENCOUNTER — Encounter: Payer: Self-pay | Admitting: Certified Nurse Midwife

## 2016-02-26 VITALS — BP 120/80 | HR 68 | Resp 16 | Ht 62.0 in | Wt 184.0 lb

## 2016-02-26 DIAGNOSIS — N952 Postmenopausal atrophic vaginitis: Secondary | ICD-10-CM | POA: Diagnosis not present

## 2016-02-26 DIAGNOSIS — N760 Acute vaginitis: Secondary | ICD-10-CM

## 2016-02-26 DIAGNOSIS — H9313 Tinnitus, bilateral: Secondary | ICD-10-CM | POA: Diagnosis not present

## 2016-02-26 NOTE — Telephone Encounter (Signed)
Patient wants to speak with [facility]. She states she is still having the same issue.

## 2016-02-26 NOTE — Progress Notes (Signed)
61 y.o. Single Caucasian female [name] here with complaint of vaginal symptoms of itching, at clitoral area only. Was treated for yeast vaginitic and dermatitis with Diflucan and Terazol 7 on 02/21/16 inside of vaginal opening and clitoral area only. Onset of symptoms, on going, depending on "how my day is". Chronic history of vulvovaginitis and dermatitis related to moisture and not bathing daily.  Using  OTC Desenex Antifungal powder in groin area for yeast control. Has Destin cream to use prn for rectal itching, working well. Denies new personnal products.Uses coconut oil for vaginal dryness. Bathing daily now. Sees PCP monthly, prn. Feels like I maybe better.  Took car for inspection and needed battery. Patient was able to afford replacement, and glad she has her car. No other health issues today.   ROS Pertinent to HPI  O:Healthy female WDWN Affect: normal, orientation x 3  Exam:Skin: normal appearance, warm and dry Abdomen:non tender Lymph node: no enlargement or tenderness Pelvic exam: External genital: normal female, right vulva mass known, unchanged, no excoriated areas or increase pink areas noted. No scaling or exudate  Noted. Groin area dry, no redness BUS: negative Vagina: scant non odorous white discharge noted. Ph:4.0   ,Wet prep taken Cervix:absent Uterus: absent Adnexa:absent no masses Rectal: hemorrhoid tag, no redness or irritation noted   Wet Prep results: Koh, Saline, negative vaginal and vulva for pathogens   A:Normal pelvic exam Yeast vulvovaginitis and dermatitis resolved Healthy skin in genital area noted   P:Discussed findings of normal exam and yeast issues have resolved. Discussed she needs to bath daily because this is helping with moisture control and infection. Patient voiced understanding. Continue coconut oil for vaginal and vulva dryness and Destin as needed for rectal irritation. OK to [place] her wipes in rectal area only. Continue Desenex powder in groin area  for moisture. and etiology. Avoid moist clothes or pads for extended period of time. Questions addressed at length.  Time with consultation in addition to exam 15 minutes  Rv prn, aex

## 2016-02-26 NOTE — Telephone Encounter (Signed)
Spoke with patient. Patient states that since she was seen for evaluation on 02/21/2016 she has been bathing daily and using Hydrocortisone cream externally for itching as needed. Is also using coconut oil daily in the morning. Patient has use Hydrocortisone for 5 days and reports no relief. Feels her itching has increased and is worsening. Patient is very concerned. Appointment scheduled for today 02/26/2016 at 10 am with [name]. Patient is agreeable to date and time.  Routing to provider for final review. Patient agreeable to disposition. Will close encounter.

## 2016-02-26 NOTE — Telephone Encounter (Signed)
Patient is calling again stating "I really need to talk with [facility]". I informed patient that Verline Lema just arrived to the office and will call her back.

## 2016-02-26 NOTE — Patient Instructions (Addendum)
Use your coconut oil every day for dryness. Use Desenex Powder in groin area for moisture control Baby wipes ok for rectal cleaning only  Bath daily

## 2016-02-27 ENCOUNTER — Other Ambulatory Visit: Payer: Self-pay | Admitting: Family Medicine

## 2016-02-27 ENCOUNTER — Ambulatory Visit: Payer: Medicare Other | Admitting: Podiatry

## 2016-03-01 NOTE — Progress Notes (Signed)
Encounter reviewed [name], MD   

## 2016-03-05 ENCOUNTER — Telehealth: Payer: Self-pay | Admitting: Certified Nurse Midwife

## 2016-03-05 NOTE — Telephone Encounter (Signed)
Spoke with patient. Patient states that she has been showering and using coconut cream nighty. Felt she was beginning to have external irritation in the crease of her thigh close to the vaginal area. Has started applying antifungal powder externally to the area once a day with relief. Reports having intermittent external itching. Denies any itching at this time or vaginal discharge. Advised she may continue using coconut oil nightly and may use Hydrocortisone cream as needed for intermittent external itching. Advised if symptoms worsen or develops new symptoms will need to be seen for further evaluation. Patient is agreeable.   Routing to [employer] CNM for review.

## 2016-03-05 NOTE — Telephone Encounter (Signed)
agree

## 2016-03-05 NOTE — Telephone Encounter (Signed)
Patient wants to speak with Kaitlyn. °

## 2016-03-06 ENCOUNTER — Ambulatory Visit: Payer: Medicare Other | Admitting: Certified Nurse Midwife

## 2016-03-06 ENCOUNTER — Telehealth: Payer: Self-pay | Admitting: Certified Nurse Midwife

## 2016-03-06 DIAGNOSIS — H903 Sensorineural hearing loss, bilateral: Secondary | ICD-10-CM | POA: Diagnosis not present

## 2016-03-06 NOTE — Telephone Encounter (Signed)
Patient is still having vaginal itching and would like to speak with nurse.

## 2016-03-06 NOTE — Telephone Encounter (Signed)
Patient is calling again asking to talk with [facility]. Marland Kitchen

## 2016-03-06 NOTE — Telephone Encounter (Signed)
Spoke with patient. Patient states she believes she needs to come in to see the doctor. Patient states she is using antifungal powders, coconut oil and cortisone cream and still itching externally. Patient states she thinks it is best to come on in. Patient scheduled for 03/07/16 at 0930 with Melvia Heaps, CNM. Patient is agreeable to date and time.  Routing to provider for final review. Patient is agreeable to disposition. Will close encounter.

## 2016-03-07 ENCOUNTER — Ambulatory Visit (INDEPENDENT_AMBULATORY_CARE_PROVIDER_SITE_OTHER): Payer: Medicare Other | Admitting: Certified Nurse Midwife

## 2016-03-07 ENCOUNTER — Encounter: Payer: Self-pay | Admitting: Certified Nurse Midwife

## 2016-03-07 VITALS — BP 110/64 | HR 68 | Resp 16 | Ht 62.0 in | Wt 183.0 lb

## 2016-03-07 DIAGNOSIS — B372 Candidiasis of skin and nail: Secondary | ICD-10-CM | POA: Diagnosis not present

## 2016-03-07 NOTE — Progress Notes (Signed)
61 y.o. Single Caucasian female [name] here with complaint of  Itching in groin area only. Using coconut oil daily at hs in vaginal area and hydrocortisone OTC cream in clitoral area with prn itching only. Denies increase vaginal discharge or vaginal itching. Describes discharge as "normal for her". Showering daily as discussed with less itching. Using antifungal powder in groin area daily, lies down to apply,so can not tell how much is dispensed.Onset of symptoms 3 days ago. Denies new personal products. Urinary symptoms none . No constipation or bowel issues. Uses Destin cream around rectal area to prevent irritation and hemorrhoid flare. Eating well. Using walker with more skill now. Recent hearing screen with some hearing loss. No hearing aids. No other issues today.  ROS Pertinent to HPI  O:Healthy female WDWN Affect: normal, orientation x 3  Exam:Skin: warm and dry, no open areas Abdomen: soft non tender Groin area bilateral: powder caked on skin and some redness, no open areas or weeping., non tender. Area cleaned with personal wipes and air dried. Patient shown how to apply and just dusting amount on her hand and pat to area. Do this standing in front of mirror so she can see. Discussed will prevent overuse and moisture. Voiced understanding and repeated instructions back to me. Wet prep taken  Inguinal lymph nodes: no enlargement or tenderness Pelvic exam: External genital: normal female, right vulva mass size unchanged, non tender, no scaling or exudate BUS: negative Vagina: scant discharge noted. Ph: 4.0  ,Wet prep taken, Cervix: normal, non tender, no CMT Uterus: normal, non tender Adnexa:normal, non tender, no masses or fullness noted   Wet Prep results:Koh, Saline negative from vagina, positive in groin area for yeast   A:Normal pelvic exam Yeast dermatitis Sarcoidosis primary site right vulva, no change insize  P:Discussed findings of yeast dermatitis and etiology. Discussed  continue daily showering, used powder as discussed. Continue coconut oil as instructed.  Rv prn

## 2016-03-07 NOTE — Patient Instructions (Signed)
Use dusting of Antifungal powder to groin area once daily Coconut oil to vaginal opening and at top daily Shower daily

## 2016-03-10 ENCOUNTER — Telehealth: Payer: Self-pay | Admitting: Certified Nurse Midwife

## 2016-03-10 NOTE — Telephone Encounter (Signed)
Spoke with patient. Patient states she is not sure if itching is related to nerves or just itching. Patient states she is having vaginal itching and wants to know if can still use hydrocortisone cream? Patient reports Groin area feels better, still showering daily. Patient states using desitin for rectum. Advised patient to try the coconut oil first, apply externally to vaginal opening and top. Continue to use dusting of antifungal powder once daily to groin. Shower daily. Patient states she is only to use hydrocortisone cream if coconut oil does not work? Advised patient to return call to office if coconut oil not helping or resolving itching. Patient verbalizes understanding and is agreeable.  Routing to provider for final review. Patient is agreeable to disposition. Will close encounter.  Cc: Melvia Heaps, CNM

## 2016-03-10 NOTE — Telephone Encounter (Signed)
Patient wanting to know if she should still be taking the vaginal cream.

## 2016-03-11 NOTE — Progress Notes (Signed)
Encounter reviewed Caylah Plouff, MD   

## 2016-03-12 ENCOUNTER — Telehealth: Payer: Self-pay | Admitting: Certified Nurse Midwife

## 2016-03-12 ENCOUNTER — Ambulatory Visit: Payer: Medicare Other | Admitting: Certified Nurse Midwife

## 2016-03-12 NOTE — Telephone Encounter (Signed)
Spoke with patient. Patient states that after taking a shower this morning and washing vaginal area she had one episode of vaginal itching and pain. States pain was short and sharp. Denies any current pain, vaginal itching or discharge. States she used coconut oil and is feeling better. Asking if she needs an appointment. Advised she did the right thing by washing and using coconut oil. Advised if not having symptoms at this time and feels better after using coconut oil okay to monitor. Patient is agreeable. Aware if symptoms reoccur she will need to return call to the office. Patient is agreeable.  Routing to provider for final review. Patient agreeable to disposition. Will close encounter.

## 2016-03-12 NOTE — Telephone Encounter (Signed)
agree

## 2016-03-12 NOTE — Telephone Encounter (Signed)
Patient says she is having vaginal pain and used coconut oil. She would like nurse to call her.

## 2016-03-13 ENCOUNTER — Telehealth: Payer: Self-pay | Admitting: Certified Nurse Midwife

## 2016-03-13 NOTE — Telephone Encounter (Signed)
Patient having pain and has been using the coconut oil and still having problems.  Wants to speak to a nurse

## 2016-03-13 NOTE — Telephone Encounter (Signed)
Spoke with patient. Patient states that she has been using coconut oil daily and is still having vaginal itching. Has been bathing daily and states she is washing her vaginal area and drying well after showering. States she has used Hydrocortisone cream externally once for itching. Reports she is having internal vaginal itching that is not relieved with use of coconut oil. Has been using anti fungal powder in groin area. Is applying a light amount daily and feels this area is doing much better. Patient feels her vaginal symptoms are worsening and is requesting an appointment. Denies any vaginal discharge or pain. Appointment scheduled for tomorrow 03/14/2016 at 9:30 am with [name]. Patient is agreeable to date and time.  Routing to provider for final review. Patient agreeable to disposition. Will close encounter.

## 2016-03-14 ENCOUNTER — Ambulatory Visit (INDEPENDENT_AMBULATORY_CARE_PROVIDER_SITE_OTHER): Payer: Medicare Other | Admitting: Certified Nurse Midwife

## 2016-03-14 ENCOUNTER — Encounter: Payer: Self-pay | Admitting: Certified Nurse Midwife

## 2016-03-14 VITALS — BP 118/74 | HR 70 | Resp 16 | Ht 62.0 in | Wt 184.0 lb

## 2016-03-14 DIAGNOSIS — L298 Other pruritus: Secondary | ICD-10-CM

## 2016-03-14 DIAGNOSIS — N898 Other specified noninflammatory disorders of vagina: Secondary | ICD-10-CM

## 2016-03-14 DIAGNOSIS — N952 Postmenopausal atrophic vaginitis: Secondary | ICD-10-CM | POA: Diagnosis not present

## 2016-03-14 DIAGNOSIS — B372 Candidiasis of skin and nail: Secondary | ICD-10-CM | POA: Diagnosis not present

## 2016-03-14 DIAGNOSIS — N760 Acute vaginitis: Secondary | ICD-10-CM | POA: Diagnosis not present

## 2016-03-14 MED ORDER — NYSTATIN 100000 UNIT/GM EX POWD: CUTANEOUS | 2 refills | Status: DC

## 2016-03-14 NOTE — Patient Instructions (Signed)
Apply prescription powder to groin area twice daily, small amount with dusting. Stop H(hydrocortisone) cream and use coconut oil at this point until lab work back. Pat dry after urination only.

## 2016-03-14 NOTE — Progress Notes (Signed)
61 y.o. Single Caucasian female [name] here with complaint of vaginal symptoms of itching on "lip area only". Describes itching occurs 1-2 times daily. When occurs applies small amount of 1 % Hydrocortisone and itching sometimes stops. Has been using OTC antifungal powder once daily with dusting only in groin area. Onset of symptoms 2-4 days ago. Denies new personal products.Using [employer] as recommended. Urinary symptoms none . Denies urinary frequency or urgency, back pain or fever or chills. Bowel movements are regular, no constipation. Uses baby wipes to clean after BM. Per Epic Hgb A-1 C is decreasing. Patient taking all medication as prescribed, from PCP H.Kim, she has a pill container to help remember. Attorney coming to make sure all is well at her home today. Sees PCP again next month. No other complaints today.   ROS Pertinent to HPI  O:Healthy female WDWN Affect: normal, orientation x 3   Exam:Skin: warm and dry CVAT: negative bilateral Abdomen: soft, non tender, no rebound or suprapubic pain  inguinal Lymph nodes: no enlargement or tenderness Groin area: fading red area but some increased pink still noted with scant exudate, area of scratching noted, no odor, light residue of powder noted Pelvic exam: External genital: normal female, with enlargedright vulva(known) no change.(site of sarcoidosis origin) no scaling or redness or exudate, atrophic appearance BUS: negative Bladder and urethral meatus non tender, no redness or meatus, no leakage with cough Vagina: very scant discharge noted.Atrophic appearance,no redness, Affirm taken Cervix: surgically absent Uterus: absent Adnexa:absent bilateral no masses or fullness noted Rectal area: Destin ointment noted, no redness or rash, non tender   A:Normal pelvic exam Chronic yeast dermatitis R/O vaginal pathogens, chronic history of itching and yeast vaginitis Atrophic vaginitis Type 2 diabetes on medication with PCP  management,unstable at present. Sarcoidosis origin on right vulva, size no change Intellectually challenged, cares for self and drives with guardian support  P:Discussed findings of groin area and that Rx is needed to clear. Patient agreeable. Explained why she needed to switch, due to length of occurrence. Stop OTC use at this time and use  Rx Nystatin powder Rx bid as shown.  Will treat per affirm if indicated. Lab: Affirm Discussed yeast more common with diabetes until well controlled which patient is working on. Instructed to use coconut oil on area of itching until lab results in. Stop hydrocortisone and Monistat. Patient repeated instructions back and written ones given.  Rv prn

## 2016-03-15 LAB — WET PREP BY MOLECULAR PROBE
CANDIDA SPECIES: NEGATIVE
Gardnerella vaginalis: NEGATIVE
TRICHOMONAS VAG: NEGATIVE

## 2016-03-17 ENCOUNTER — Telehealth: Payer: Self-pay | Admitting: Certified Nurse Midwife

## 2016-03-17 NOTE — Telephone Encounter (Signed)
Patient calling to speak with nurse about her medication. °

## 2016-03-17 NOTE — Telephone Encounter (Signed)
Spoke with patient. Advised of results as seen below. Patient would like to clarify how to use Nystatin powder. Advised she need to be applying a small amount of powder to the groin area twice daily (in the morning and at night.) Patient is agreeable and states she has been doing this. Feels she has irritation from depends. Wants to ensure she should keep using Nystatin. Advised to continue using Nystatin powder at this time. Apply very small amount to the irritated area. Advised to ensure she is bathing daily and drying well. Patient verbalizes understanding.  Notes Recorded by Regina Eck, CNM on 03/16/2016 at 7:50 PM EST Notify patient that her wet prep was negative for yeast, bacteria and trichomonas. No infection  Routing to provider for final review. Patient agreeable to disposition. Will close encounter.

## 2016-03-18 ENCOUNTER — Encounter: Payer: Self-pay | Admitting: Family Medicine

## 2016-03-18 ENCOUNTER — Ambulatory Visit (INDEPENDENT_AMBULATORY_CARE_PROVIDER_SITE_OTHER): Payer: Medicare Other | Admitting: Family Medicine

## 2016-03-18 ENCOUNTER — Telehealth: Payer: Self-pay | Admitting: Certified Nurse Midwife

## 2016-03-18 VITALS — BP 122/84 | HR 98 | Temp 97.8°F | Ht 62.0 in | Wt 184.1 lb

## 2016-03-18 DIAGNOSIS — L290 Pruritus ani: Secondary | ICD-10-CM | POA: Diagnosis not present

## 2016-03-18 DIAGNOSIS — Z9989 Dependence on other enabling machines and devices: Secondary | ICD-10-CM

## 2016-03-18 NOTE — Progress Notes (Signed)
Pre visit review using our clinic review tool, if applicable. No additional management support is needed unless otherwise documented below in the visit note. 

## 2016-03-18 NOTE — Telephone Encounter (Signed)
Spoke with patient. Patient states that she is having rectal itching. Was seen with Dr.Kim today and was advised this could be yeast. Patient reports she was advised to monitor symptoms and schedule follow up if needed. Patient has been applying Desitin externally to the rectal area as needed. Advised will need to ensure she is washing well and keeping the area clean. May use Desitin cream as needed externally. If symptoms persist into next week or if symptoms worsen she will need to be seen for follow up. Patient verbalizes understanding.  Routing to [employer] CNM for review.

## 2016-03-18 NOTE — Progress Notes (Signed)
HPI:   Walk-in acute visit for perianal pruritis: -chronic for as long as she can remember -insisted on seeing doctor today -occ pruritis in anal region - reports is just prior to and just following a bowel movement typicallly -she has seen a number of people for this including GI and gyn -desitin and hypoallergenic wipes after BMs has helped -no symptoms today -wonders if it is ok to use the wipes and the desitin as needed -denies bleeding, skin changes, melena, worsening symptoms  She also is considering [employer] transportation. She uses a walker and has had more difficulty getting around. Her HCPOA would like for her not to drive given the walker, DDD and her mental challenges. She wants to let me know a form will be sent to our office for completion.  ROS: See pertinent positives and negatives per HPI.  Past Medical History:  Diagnosis Date  . Allergic rhinitis   . Allergy   . Anemia   . Chronic low back pain   . DDD (degenerative disc disease), lumbar   . GERD (gastroesophageal reflux disease)    w/ hx diaphragmatic hernia  . Glaucoma   . GLAUCOMA NOS 03/11/2006   Qualifier: Diagnosis of  By: Diona Browner MD, Amy    . Heart murmur   . Hx of fracture    R arm, foot  . Hyperlipemia   . Hypertension   . IBS (irritable bowel syndrome)   . Low back pain 11/22/2012  . Meningioma ([place])    ant falx, stable on prior imaging  . Moderate intellectual disabilities   . Murmur   . Obesity   . Osteoarthrosis, unspecified whether generalized or localized, ankle and foot   . Sarcoidosis ([place])    difuse bony lesions and LDA, dx by biopsy in 2016  . Transaminitis   . Type II or unspecified type diabetes mellitus without mention of complication, not stated as uncontrolled     Past Surgical History:  Procedure Laterality Date  . CATARACT EXTRACTION    . COLONOSCOPY  04/15/2011  . ELBOW SURGERY     right elbow  . LIPOMA EXCISION Left [id number]   posterior left axilla  . TOTAL ABDOMINAL  HYSTERECTOMY  03/05/1992   leiomyoma and cellular leiomyoma    Family History  Problem Relation Age of Onset  . Diabetes Mother   . Stroke Mother   . Hypertension Mother   . Stroke Father   . Diabetes Maternal Aunt   . Heart disease Maternal Aunt      (had pacemaker)  . Diabetes Maternal Grandmother   . Stroke Maternal Grandmother   . Hypertension Maternal Grandmother   . Colon cancer Neg Hx     Social History   Social History  . Marital status: Single    Spouse name: N/A  . Number of children: 0  . Years of education: N/A   Occupational History  . Disabled (from arm fracture)    Social History Main Topics  . Smoking status: Never Smoker  . Smokeless tobacco: Never Used  . Alcohol use No  . Drug use: No  . Sexual activity: Not Currently    Birth control/ protection: Surgical     Comment: Hyst--TAH--unsure if has ovaries   Other Topics Concern  . None   Social History Narrative   Lives at [facility] (on [address] in [place])      [place]      Mental Retardation, but is able to drive  to places she is comfortable with and prepare her own meals and manage most of her own affairs.      [name] (neighbor) is POA/HCPOA      Reports she gets regular exercise and tries to eat healthy     Current Outpatient Prescriptions:  .  acetaminophen (TYLENOL) 500 MG tablet, Take 500 mg by mouth as needed (left ear pain). Reported on 04/18/2015, Disp: , Rfl:  .  diltiazem (CARDIZEM CD) 240 MG 24 hr capsule, TAKE ONE CAPSULE BY MOUTH EVERY DAY, Disp: 90 capsule, Rfl: 2 .  ferrous sulfate 325 (65 FE) MG tablet, TAKE 1 TABLET (325 MG TOTAL) BY MOUTH 2 (TWO) TIMES DAILY WITH A MEAL., Disp: , Rfl: 2 .  fluticasone (FLONASE) 50 MCG/ACT nasal spray, Place into both nostrils daily., Disp: , Rfl:  .  hydrochlorothiazide (MICROZIDE) 12.5 MG capsule, TAKE 1 CAPSULE BY MOUTH ONCE DAILY, Disp: 90 capsule, Rfl: 1 .   hydrocortisone (ANUSOL-HC) 25 MG suppository, UNWRAP AND INSERT 1 SUPPOSITORY IN THE RECTUM TWICE A DAY, Disp: , Rfl: 1 .  hydrocortisone 2.5 % ointment, APPLY TO AFFECTED AREA TWICE A DAY, Disp: , Rfl: 0 .  ipratropium (ATROVENT) 0.06 % nasal spray, PLACE 2 SPRAYS INTO BOTH NOSTRILS 4 (FOUR) TIMES DAILY., Disp: 45 mL, Rfl: 3 .  ketoconazole (NIZORAL) 2 % shampoo, APPLY TO AFFECTED AREA TWICE A WEEK AS DIRECTED, Disp: 120 mL, Rfl: 1 .  Lactobacillus (FLORAJEN ACIDOPHILUS PO), Take 1 tablet by mouth daily., Disp: , Rfl:  .  latanoprost (XALATAN) 0.005 % ophthalmic solution, Place 1 drop into both eyes at bedtime. , Disp: , Rfl:  .  liver oil-zinc oxide (DESITIN) 40 % ointment, Apply 1 application topically as needed for irritation., Disp: , Rfl:  .  loratadine (CLARITIN) 10 MG tablet, Take 10 mg by mouth daily., Disp: , Rfl:  .  metFORMIN (GLUCOPHAGE) 500 MG tablet, TAKE 1 TABLET BY MOUTH TWICE A DAY WITH A MEAL, Disp: 180 tablet, Rfl: 1 .  miconazole (MICOTIN) 2 % cream, Apply 1 application topically 2 (two) times daily., Disp: , Rfl:  .  nystatin (MYCOSTATIN/NYSTOP) powder, Apply small mount of powder to groin area twice daily. Am/pm light dusting., Disp: 30 g, Rfl: 2 .  omeprazole (PRILOSEC) 20 MG capsule, TAKE 1 CAPSULE (20 MG TOTAL) BY MOUTH 2 (TWO) TIMES DAILY., Disp: 180 capsule, Rfl: 3 .  potassium chloride SA (KLOR-CON M20) 20 MEQ tablet, Take 1 tablet (20 mEq total) by mouth daily., Disp: 90 tablet, Rfl: 3 .  pravastatin (PRAVACHOL) 40 MG tablet, TAKE 1 TABLET BY MOUTH EVERY EVENING, Disp: 90 tablet, Rfl: 1 .  Propylene Glycol (SYSTANE BALANCE) 0.6 % SOLN, , Disp: , Rfl:  .  traMADol (ULTRAM) 50 MG tablet, Take 50 mg by mouth daily., Disp: , Rfl: 0 .  UNKNOWN TO PATIENT, , Disp: , Rfl:   EXAM:  Vitals:   03/18/16 1133  BP: 122/84  Pulse: 98  Temp: 97.8 F (36.6 C)    Body mass index is 33.67 kg/m.  GENERAL: vitals reviewed and listed above, alert, oriented, appears well  hydrated and in no acute distress  HEENT: atraumatic, conjunttiva clear, no obvious abnormalities on inspection of external nose and ears  NECK: no obvious masses on inspection  LUNGS: clear to auscultation bilaterally, no wheezes, rales or rhonchi, good air movement  CV: HRRR, no peripheral edema  RECTAL: deferred given no symptoms today and per chart exam performed 03/14/16  MS: moves all extremities  without noticeable abnormality, uses walker and uses caution with ambulation   PSYCH: pleasant and cooperative, no obvious depression or anxiety  ASSESSMENT AND PLAN:  Discussed the following assessment and plan:  Perianal pruritus  Uses roller walker  -ok to use desitin prn and hypoallergenic wipes if helpful - suspect mild yeast issue -query mild ibs -advised she can let her gastroenterologist know about this if persists -will await forms to fill out -Patient advised to return or notify a doctor immediately if symptoms worsen or persist or new concerns arise.  There are no Patient Instructions on file for this visit.  [name] R., DO

## 2016-03-18 NOTE — Progress Notes (Signed)
Encounter reviewed Koa Zoeller, MD   

## 2016-03-18 NOTE — Telephone Encounter (Signed)
Patient still having itching now in the rectal area.  She saw her PCP today and he said it may be yeast.  Wants to speak to a nurse about it.

## 2016-03-19 NOTE — Telephone Encounter (Signed)
agree

## 2016-03-19 NOTE — Telephone Encounter (Signed)
Spoke with patient. Patient is asking if she needs to schedule an appointment to come in to see [name]. States "I feel like I am going to itch." Denies any current symptoms. Denies itching rectally or vaginally. Denies vaginal discharge or any pain. Advised patient if she is not having any symptoms at this time office visit is not recommended. Patient verbalizes understanding. Advised patient if she develops any symptoms will need to contact the office to discuss office visit. Patient is agreeable and verbalizes understanding.  Routing to [employer] CNM for review.

## 2016-03-20 ENCOUNTER — Telehealth: Payer: Self-pay | Admitting: Certified Nurse Midwife

## 2016-03-20 ENCOUNTER — Ambulatory Visit: Payer: Self-pay

## 2016-03-20 NOTE — Telephone Encounter (Signed)
Patient wants to speak with Kaitlyn. °

## 2016-03-20 NOTE — Telephone Encounter (Signed)
Spoke with patient. Patient states that over night she began to have rectal itching. Has washed the area this morning and is keeping it dry. Is using Desitin as needed. States she has some relief with using Desitin. Feels her symptoms are worsening. RN asked patient again if itching is persistent or if patient is worried about itching. Patient states she is having internal and external itching. Requesting an office visit for evaluation. Advised patient may need more time with daily cleaning and using Desitin as needed for her to see relief. Patient is fixated on possibility of yeast. All recent testing was negative for yeast. Patient is requesting and office visit. Offered appointment today at 11 am, but patient has an appointment at 10:30 am. Aware [name] is out of the office this afternoon. Appointment scheduled for tomorrow 03/21/2016 at 10 am with [name]. Patient is agreeable.   Routing to provider for final review. Patient agreeable to disposition. Will close encounter.

## 2016-03-20 NOTE — Telephone Encounter (Signed)
Patient is calling to speak with the nurse. She is aware a message has been sent to the nurse.

## 2016-03-21 ENCOUNTER — Telehealth: Payer: Self-pay | Admitting: Certified Nurse Midwife

## 2016-03-21 ENCOUNTER — Ambulatory Visit (INDEPENDENT_AMBULATORY_CARE_PROVIDER_SITE_OTHER): Payer: Medicare Other | Admitting: Certified Nurse Midwife

## 2016-03-21 ENCOUNTER — Encounter: Payer: Self-pay | Admitting: Certified Nurse Midwife

## 2016-03-21 ENCOUNTER — Other Ambulatory Visit: Payer: Self-pay

## 2016-03-21 VITALS — BP 118/70 | HR 70 | Resp 16 | Ht 62.0 in | Wt 184.0 lb

## 2016-03-21 DIAGNOSIS — B372 Candidiasis of skin and nail: Secondary | ICD-10-CM

## 2016-03-21 DIAGNOSIS — Z8639 Personal history of other endocrine, nutritional and metabolic disease: Secondary | ICD-10-CM

## 2016-03-21 DIAGNOSIS — Z780 Asymptomatic menopausal state: Secondary | ICD-10-CM | POA: Diagnosis not present

## 2016-03-21 MED ORDER — FLUCONAZOLE 150 MG PO TABS: 150.0000 mg | ORAL_TABLET | Freq: Once | ORAL | 0 refills | Status: AC

## 2016-03-21 NOTE — Telephone Encounter (Signed)
Spoke with patient. Patient states she is confused on the pink pill she is supposed to take, do I take 2 today? Reviewed with patient take 1 pill today, take 1 pill Sunday, and 1 pill on Friday. Advised patient she will have an extra pill left. Patient repeated x2, patient verbalizes understanding and is agreeable.  Routing to provider for final review. Patient is agreeable to disposition. Will close encounter.

## 2016-03-21 NOTE — Telephone Encounter (Signed)
Patient calling to speak with nurse about her medication she picked up from the pharmacy.

## 2016-03-21 NOTE — Progress Notes (Signed)
61 y.o. Single Caucasian female [name] here with complaint of "yeast in bowel". Having itching,in that area or feels like it "might itch".Denies vaginal itching or vaginal discharge.Per patient using coconut oil when she needs it, not daily. Using powder "like we told her".  Onset of symptoms off and on for 2-3 days.. Denies new personal products or vaginal dryness complaints. Questioned regarding changing and washing clothes( she wear same 2 outfits to visits. She washes as needed, because it "cost a lot". Wears disposable depends daily when not at home, then no underwear. Denies groin issues or itching there. Was seen by PCP Dr. Maudie Mercury for diabetes management , better control per epic labs. No leaking of urine unless waits to long to empty bladder. Denies change in stool color or blood in stool. Questioned about being lonely and admits she has little contact except by phone and at mail box. Does have legal representative call weekly? No other health concerns.  ROS pertinent to HPI  O:Healthy female WDWN Affect: normal, orientation x 3  Exam:skin warm and dry Abdomen:soft, non tender, flap under abdomen red and weeping with creamy exudate. Wet prep taken  Inguinal Lymph nodes: no enlargement or tenderness Pelvic exam: External genital: normal female, atrophic appearance, groin creases red with exudate and over use of powder appearance, slight musty odor wet prep taken Vulva enlarged on right(known) sarcoid site, no size change, no lesions or scaling BUS: negative Vagina: atrophic, slight increase pink at introitus only, wet prep taken,scant discharge noted. Ph:4.0  Cervix: absent Uterus: absent Adnexa:absent no masses or fullness noted Anal area: no Destin residue noted, no redness or cracking noted,  Rectal exam: negative, no blood  Wet Prep results:KOH,Saline + yeast on skin areas only, vaginal negative   A:Normal pelvic exam s/P TAH Yeast dermatitis ? Good personal hygiene and  clothing Atrophic vaginitis with coconut oil use as needed Rectal exam and area normal appearance Mentally challenged history   P:Discussed findings of yeast dermatitis and etiology. Discussed importance of washing and drying area of concern on abdomen and groin area. Stop applying powder to area now. Instructed to use coconut oil to area for moisture protection only. No powder or cream. Patient voiced understanding. Discussed how to clean vaginal and rectal area and need to keep dry also. She can continue coconut oil to vaginal area and Destin to rectal area only as needed.. Patient voiced understanding and repeated instructions. Discussed importance of changing and washing clothes with multiple wearing. Patient will "do the best she can". Asked patient if we could have ?home health to check on her, she would be ok if no cost. Will check to see what is available for patient and advise.  Recheck area in 2 weeks, prn  20 minutes in additional time spent with patient regarding instructions.

## 2016-03-21 NOTE — Patient Instructions (Signed)
Take pill today and take another pill in 2 days ( Sunday) and repeat in 5 days   Dry  Crease under tummy and in between legs well and apply coconut oil to these areas after bathing and drying area well. Daily. Only Destin in rectum when you are itching.

## 2016-03-21 NOTE — Patient Outreach (Signed)
Linda Thompson) Care Management  03/21/2016  Linda Thompson 1955-07-17 [id number]   Telephonic Monthly Assessment   Referral Date:  11/27/2015 Screening and Initial Assessment 11/29/2015.   H/o high utilization of services:  H/o patient scheduling appointment for minor concerns.  THN to act as liaison between patient at home and scheduling appointments.   Patient prefers to see her MD because she understands better when having a face to face visit Insurance:  Medicare   Subjective:   Patient completed call.  [facility] # [address], [place] [address] [phone] (H)  Providers: Primary MD: Dr. [name], DO  -  last appt: 02/18/16 - next appt 05/19/16 Podiatrist:  Dr. Gardiner Barefoot - last appt 02/14/16 Gynecologist: Dr. Hale Bogus and Melvia Heaps, CNM - last appt - 02/12/16 and several telephone calls since that date.  Next appt 02/22/16 for follow-up on vaginal itching.  Orthopedist: Dr. Lorin Mercy - last appt 01/2016 Dentist:  last appt 03/18/2016 HH:  PT - [facility]  # [phone]  (services completed 01/2016)  Psycho/Social: Patient with moderate mental retardation and  lives in [place] apartment alone.  Parents are deceased.  Patient has HCPOA and attorney who manages her care.  Caregiver: [name] [phone] POA:  Grace Bushy Ph: [phone] / Fax: [phone] (takes care of paying all of patients bills).  [address], [place] [address]  Mobility:  Weakness in left leg improved.  Patient walking without walker. Falls: (2)  12/11/15 and 02/12/16 Last fall [facility]/12/16 at the [facility]   Patient fell on left knee and sent to ED.  (left knee abrasion).  Pt reports she was using her walker to ambulate in the parking lot of [facility] this afternoon when she suddenly "dropped her pocketbook" and lost her balance, causing her to fall onto her left knee. Patient "called  for help" and 3 nurses came out to help her and EMS was called.  Patient denies any ambulation problems or pain this call. Pain: arthritis in Left hip. Depression: none Transportation:  Patient not driving since fall on 12/11/15.  Transportation provided by caregiver, Ms [name]. [phone].  Advance Directive: Ms [name] would make decisions.  Consent:  yes DME:  Cane (4 pronged),  Rolator, eyeglasses, partial lower dental plate.   Co-morbidities:  Diabetes Type 2, Essential hypertension, GERD, IBS, Meningioma, Mental Retardation (Moderate); Glaucoma, Obesity, morbid; Sarcoidosis with positive PET scan for lymph node enlargement.  Admissions: 0 ER visits: 2  BP 122/84 03/18/2016 Weight 184 lb (84 kg) 03/18/2016 Height 62 in (157 cm) 03/18/2016 BMI 33.70 (Obese Class I) 03/18/2016  Diabetes Type 2: Per POC verification with Dr. Maudie Mercury:  "Thank you for your care. She is not on insulin, so does not need to routinely monitor home glucose. Working on a healthy diet would be great! Thanks."    Glucometer in the home:  None Weight:  184 down from 193 over the past month.   States eating more vegetables. Patient continues to eat canned / high sodium foods but states she does not eat any salt.     A1C 6.700 08/14/2015  (down to 6.2 on 02/18/2016  Medications:  Prevnar (PCV13) N/D Pneumovax (PP 02/08/2009 Flu Vaccine 08/31/2015 tDAP Vaccine 08/08/2014 Pharmacy:  [facility] and picks medications up herself  Encounter Medications:  Outpatient Encounter Prescriptions as of 03/21/2016  Medication Sig  . acetaminophen (TYLENOL) 500 MG  tablet Take 500 mg by mouth as needed (left ear pain). Reported on 04/18/2015  . diltiazem (CARDIZEM CD) 240 MG 24 hr capsule TAKE ONE CAPSULE BY MOUTH EVERY DAY  . ferrous sulfate 325 (65 FE) MG tablet TAKE 1 TABLET (325 MG TOTAL) BY MOUTH 2 (TWO) TIMES DAILY WITH A MEAL.  . fluconazole (DIFLUCAN) 150 MG tablet Take 1 tablet (150 mg total) by mouth once. Take 1 tablet  today, then 1 tablet in 2 days and repeat 1 tablet in 5 days  . fluticasone (FLONASE) 50 MCG/ACT nasal spray Place into both nostrils daily.  . hydrochlorothiazide (MICROZIDE) 12.5 MG capsule TAKE 1 CAPSULE BY MOUTH ONCE DAILY  . hydrocortisone (ANUSOL-HC) 25 MG suppository UNWRAP AND INSERT 1 SUPPOSITORY IN THE RECTUM TWICE A DAY  . hydrocortisone 2.5 % ointment APPLY TO AFFECTED AREA TWICE A DAY  . ipratropium (ATROVENT) 0.06 % nasal spray PLACE 2 SPRAYS INTO BOTH NOSTRILS 4 (FOUR) TIMES DAILY.  Marland Kitchen ketoconazole (NIZORAL) 2 % shampoo APPLY TO AFFECTED AREA TWICE A WEEK AS DIRECTED  . Lactobacillus (FLORAJEN ACIDOPHILUS PO) Take 1 tablet by mouth daily.  Marland Kitchen latanoprost (XALATAN) 0.005 % ophthalmic solution Place 1 drop into both eyes at bedtime.   Marland Kitchen liver oil-zinc oxide (DESITIN) 40 % ointment Apply 1 application topically as needed for irritation.  Marland Kitchen loratadine (CLARITIN) 10 MG tablet Take 10 mg by mouth daily.  . metFORMIN (GLUCOPHAGE) 500 MG tablet TAKE 1 TABLET BY MOUTH TWICE A DAY WITH A MEAL  . miconazole (MICOTIN) 2 % cream Apply 1 application topically 2 (two) times daily.  Marland Kitchen nystatin (MYCOSTATIN/NYSTOP) powder Apply small mount of powder to groin area twice daily. Am/pm light dusting.  Marland Kitchen omeprazole (PRILOSEC) 20 MG capsule TAKE 1 CAPSULE (20 MG TOTAL) BY MOUTH 2 (TWO) TIMES DAILY.  Marland Kitchen potassium chloride SA (KLOR-CON M20) 20 MEQ tablet Take 1 tablet (20 mEq total) by mouth daily.  . pravastatin (PRAVACHOL) 40 MG tablet TAKE 1 TABLET BY MOUTH EVERY EVENING  . Propylene Glycol (SYSTANE BALANCE) 0.6 % SOLN   . traMADol (ULTRAM) 50 MG tablet Take 50 mg by mouth daily.  Marland Kitchen UNKNOWN TO PATIENT    No facility-administered encounter medications on file as of 03/21/2016.     Functional Status:  In your present state of health, do you have any difficulty performing the following activities: 02/21/2016 01/25/2016  Hearing? N N  Vision? N N  Difficulty concentrating or making decisions? N N  Walking or  climbing stairs? N Y  Dressing or bathing? N N  Doing errands, shopping? N Y  Conservation officer, nature and eating ? N -  Using the Toilet? N -  In the past six months, have you accidently leaked urine? N -  Do you have problems with loss of bowel control? N -  Managing your Medications? N -  Managing your Finances? N -  Housekeeping or managing your Housekeeping? N -  Some recent data might be hidden    Fall/Depression Screening: PHQ 2/9 Scores 02/21/2016 01/30/2016 01/28/2016 01/25/2016 01/03/2016 12/25/2015 12/11/2015  PHQ - 2 Score 0 0 0 0 0 0 0    Falls: (2)  12/11/15 and 02/12/16 Last fall 02/12/16 at the [facility]   Patient fell on left knee and sent to ED.  (left knee abrasion).  Pt reports she was using her walker to ambulate in the parking lot of [facility] this afternoon when she suddenly "dropped her pocketbook" and lost her balance,  causing her to fall onto her left knee. Patient "called for help" and 3 nurses came out to help her and EMS was called.  Patient denies any ambulation problems or pain this call. Fall Risk  03/21/2016 02/21/2016 02/21/2016 01/28/2016 01/25/2016  Falls in the past year? Yes Yes Yes No (No Data)  Number falls in past yr: 2 or more 2 or more _0 Injury with Fall? _1   Risk Factor Category  _2   Risk for fall due to : History of fall(s);Impaired balance/gait History of fall(s);Impaired balance/gait History of fall(s);Impaired mobility Impaired balance/gait;Impaired mobility;History of fall(s) History of fall(s);Impaired balance/gait;Impaired mobility  Follow up Falls evaluation completed;Education provided;Falls prevention discussed Education provided;Falls evaluation completed;Falls prevention discussed Falls evaluation completed;Falls prevention discussed;Education provided Falls evaluation completed;Education provided Falls evaluation completed;Education  provided;Falls prevention discussed    Preventives: Hearing: none- hearing issues  Eyes: Dr. 10/2015 per patient  DM Eye Exam: positive retin 05/29/2015 DM Foot Exam 02/01/2015 Dentist: last appt  02/2016 Bone Density: unknown Colonoscopy 04/18/2015 Mammogram 09/19/2015  Plan:  Referral Date:  11/27/2015 Screening and Initial Assessment 11/29/2015 Telephonic RN CM 11/29/2015 - 03/21/16 Program:  DM 11/29/2015 - 03/21/16 Care Plan update to MD 01/28/16 Case closure 03/21/16  H/o High utilization of provider services (improved) -goals of care met.   [facility] Educational Materials:  (reviewed 02/21/2016, 03/21/2016) -Diabetes Care Checklist -Diabetes: Why Get Your A1C Checked? -Diabetes: Controlling Blood Sugar -Type 2 Diabetes: Taking Care of Yourself Throughout The Year -Counting Carbohydrates -Low-Salt Diet -About High Blood Pressure (Hypertension) -High Blood Pressure (HyperTension): What You Can Do -How to [place] Referral 12/31/2015 / (completed 01/25/16) -Resolved   THN SW Referral (12/31/2015 (SW completed services 02/22/2016).  -Patient currently in ILF but needs higher level of care to meet needs.  -H/o PACE Referral sent by RN CM on 12/31/2015 SW update 01/28/16: HH and [name] stated that patient will be able to drive herself to this appointment. She also has SCAT in place but has never used service so we cannot provide transportation for her but I will complete home visit this week and educate her on how to schedule SCAT and review other resources again since PACE was too expensive and they were not able to gain services. Booke -Resolved.  RN CM advised in case closure: goals of care met.  RN CM advised to please notify MD of any changes in condition prior to scheduled appt's.   RN CM provided contact name and # [phone] or main office # [phone] and 24-hour nurse line # [phone].  RN CM confirmed patient is aware of 911 services  for urgent emergency needs.  THN notified of case closure Case closure letter to PCP on 03/21/16  Nathaneil Canary, BSN, RN, CCM  [facility] Management Coordinator [phone] Direct [phone] Cell [phone] Office [phone] Fax [email or web address]

## 2016-03-26 ENCOUNTER — Other Ambulatory Visit: Payer: Self-pay

## 2016-03-26 NOTE — Patient Outreach (Signed)
[facility]) Care Management  03/26/2016  TIARRAH SAVILLE 09/22/1955 [id number]   Inbound voice message received from contact:  Gay Filler with [place] # [phone] for Linda Mutton, NP.  Gay Filler requesting call back to discuss patient's gyn problems / needs and if [facility] can assist.   Outreach call #1 to [place].  N/A at time of call.  RN CM left message and will schedule for next outreach call within one week.   Nathaneil Canary, BSN, RN, [place] Management Care Management Coordinator [phone] Direct [phone] Cell [phone] Office [phone] Fax [email or web address]

## 2016-03-27 NOTE — Progress Notes (Signed)
Encounter reviewed Lemoine Goyne, MD   

## 2016-03-28 ENCOUNTER — Telehealth: Payer: Self-pay | Admitting: Certified Nurse Midwife

## 2016-03-28 ENCOUNTER — Other Ambulatory Visit: Payer: Self-pay

## 2016-03-28 NOTE — Telephone Encounter (Signed)
Call back to Crystal. Current case from Dr Julianne Rice office is closed. If we have new needs identified, can send new referral over and they will reassess.

## 2016-03-28 NOTE — Telephone Encounter (Signed)
Patient is asking if she can use coconut oil on her breast for her skin irritation?

## 2016-03-28 NOTE — Telephone Encounter (Signed)
Agree with current plan

## 2016-03-28 NOTE — Telephone Encounter (Signed)
Spoke with patient. Patient states that she is having skin irritation under both of her breasts. States this is in the fold below the breast tissue. Reports this occurs when she is sweaty and is similar to the irritation she gets in her groin area. Patient is bathing daily and making sure to dry well. Asking if she may use coconut oil under her breast. Advised I have reviewed this with [name] and it is okay for her to use a small amount of coconut oil under each breast for irritation. Advised she may do this at night, rinse this off in the morning and use a light dusting of Nystatin powder under the breast for relief. Needs to stand in front of the mirror when applying powder under breasts to ensure it is getting in the right place and that it is a light amount. Patient verbalizes understanding and is able to repeat back instructions to RN x2. 10 minute phone call with patient.  Routing to [employer] CNM for review before closing.

## 2016-03-28 NOTE — Patient Outreach (Signed)
[facility]) Care Management  03/28/2016  LEEANN BADY 12-28-1955 [id number]  [facility] Consult   Inbound voice message received from contact:  Gay Filler with [place] # [phone] for Linda Mutton, NP.   States note reviewed in Epic but did not get message of attempted call.   Gay Filler requesting call back to discuss patient's gyn problems / needs and if [facility] can assist. May be reached at [phone] work or [phone] or [phone] cell and may also Gay Filler states patient frequently calls office regarding issues of concern and instruction clarifications.  States over utilization of services and patient is not getting needs met in the current home environment.    Outreach call #2 to [place].  Gay Filler or Verline Lema is n/a for call).  Left message with Office contact as follows:  -RN CM notified [facility]N to send [facility] Referral Form to provider- [facility]  (ATTNGay Filler) [address], [place] [address] Fax [phone]    -RN CM advised to complete and send back to [facility] Intake/Referral Department as directed on form. (PCP:  Dr. [name]).  [facility] will schedule for patient contact once Referral processed for [facility] eligibility.  Nathaneil Canary, BSN, RN, [facility] Management Care Management Coordinator [phone] Direct [phone] Cell [phone] Office [phone] Fax [email or web address]           Mariann Laster, MSHL, BSN, RN, [facility] Care Management Care Management Coordinator [phone] Direct [phone] Cell [phone] Office [phone] Fax [email or web address]

## 2016-03-28 NOTE — Telephone Encounter (Signed)
Crystal Hutchinson from [place] called stating she was returning Sally's or Kaitlyn's call about a referral.  She is faxing a form to be completed by her provider her if our office.  She said to keep it brief and be sure to put the patient's PCP is Dr. Maudie Mercury on the form and fax it back to the number on the form.

## 2016-04-02 NOTE — Telephone Encounter (Signed)
Okay to close encounter or is further follow up needed? °

## 2016-04-03 NOTE — Telephone Encounter (Signed)
Fax referral from received and reviewed with Debbi. No new needs identified. Since needs are the same as previous referral and they have offered what they can, no new referral at this time.  Routing to provider for final review.

## 2016-04-04 ENCOUNTER — Telehealth: Payer: Self-pay | Admitting: Certified Nurse Midwife

## 2016-04-04 NOTE — Telephone Encounter (Signed)
She does not need the Diflucan.  The coconut is the best thing she can use to keep area moist and not dry or uncomfortable.

## 2016-04-04 NOTE — Telephone Encounter (Signed)
Spoke with patient, advised as seen below per Kem Boroughs, NP. Patient repeated x2 not to take "pink pill", keep using coconut oil. Patient verbalizes understanding and is agreeable.  Routing to provider for final review. Patient is agreeable to disposition. Will close encounter.

## 2016-04-04 NOTE — Telephone Encounter (Signed)
Patient would like to speak with nurse about itching.

## 2016-04-04 NOTE — Telephone Encounter (Signed)
Spoke with patient. Patient states she is feeling dry "where she pees at and a itch". Patient reports showering nightly with clean washcloth and applying coconut oil to vaginal area. Patient denies any burning, di with urination, pain or discharge. Patient states she has one "pink pill" left, should I take it for yeast? Patient reports she was seen by a specialist over 1 year ago and was given a cream for "down there", was told to stop cream, can I use it? Advised patient not to use cream, can bring into next OV with Melvia Heaps, CNM to discuss. Patient repeated x2 not to use will bring into appointment next week. Advised patient can use coconut oil for itch as needed. Advised patient would ask Kem Boroughs, NP, as Melvia Heaps, CNM is out of the office today,  about diflucan, "pink pill" and return call with recommendation. Patient is agreeable. Total time spent on phone 15 min.  Kem Boroughs, NP -please advise on diflucan?  Cc: Melvia Heaps, CNM

## 2016-04-08 ENCOUNTER — Encounter: Payer: Self-pay | Admitting: Certified Nurse Midwife

## 2016-04-08 ENCOUNTER — Ambulatory Visit (INDEPENDENT_AMBULATORY_CARE_PROVIDER_SITE_OTHER): Payer: Medicare Other | Admitting: Certified Nurse Midwife

## 2016-04-08 VITALS — BP 110/70 | HR 68 | Resp 16 | Ht 62.0 in | Wt 185.0 lb

## 2016-04-08 DIAGNOSIS — N898 Other specified noninflammatory disorders of vagina: Secondary | ICD-10-CM

## 2016-04-08 DIAGNOSIS — Z01419 Encounter for gynecological examination (general) (routine) without abnormal findings: Secondary | ICD-10-CM | POA: Diagnosis not present

## 2016-04-08 DIAGNOSIS — Z124 Encounter for screening for malignant neoplasm of cervix: Secondary | ICD-10-CM

## 2016-04-08 DIAGNOSIS — B372 Candidiasis of skin and nail: Secondary | ICD-10-CM

## 2016-04-08 DIAGNOSIS — N951 Menopausal and female climacteric states: Secondary | ICD-10-CM

## 2016-04-08 MED ORDER — FLUCONAZOLE 150 MG PO TABS: 150.0000 mg | ORAL_TABLET | Freq: Once | ORAL | 0 refills | Status: DC

## 2016-04-08 MED ORDER — FLUCONAZOLE 150 MG PO TABS: ORAL_TABLET | ORAL | 0 refills | Status: DC

## 2016-04-08 NOTE — Patient Instructions (Signed)
Skin Yeast Infection Skin yeast infection is a condition in which there is an overgrowth of yeast (candida) that normally lives on the skin. This condition usually occurs in areas of the skin that are constantly warm and moist, such as the armpits or the groin. What are the causes? This condition is caused by a change in the normal balance of the yeast and bacteria that live on the skin. What increases the risk? This condition is more likely to develop in:  People who are obese.  Pregnant women.  Women who take birth control pills.  People who have diabetes.  People who take antibiotic medicines.  People who take steroid medicines.  People who are malnourished.  People who have a weak defense (immune) system.  People who are 75 years of age or older. What are the signs or symptoms? Symptoms of this condition include:  A red, swollen area of the skin.  Bumps on the skin.  Itchiness. How is this diagnosed? This condition is diagnosed with a medical history and physical exam. Your health care provider may check for yeast by taking light scrapings of the skin to be viewed under a microscope. How is this treated? This condition is treated with medicine. Medicines may be prescribed or be available over-the-counter. The medicines may be:  Taken by mouth (orally).  Applied as a cream. Follow these instructions at home:  Take or apply over-the-counter and prescription medicines only as told by your health care provider.  Eat more yogurt. This may help to keep your yeast infection from returning.  Maintain a healthy weight. If you need help losing weight, talk with your health care provider.  Keep your skin clean and dry.  If you have diabetes, keep your blood sugar under control. Contact a health care provider if:  Your symptoms go away and then return.  Your symptoms do not get better with treatment.  Your symptoms get worse.  Your rash spreads.  You have a fever  or chills.  You have new symptoms.  You have new warmth or redness of your skin. This information is not intended to replace advice given to you by your health care provider. Make sure you discuss any questions you have with your health care provider. Document Released: 09/24/2010 Document Revised: 09/02/2015 Document Reviewed: 07/10/2014 Elsevier Interactive Patient Education  2017 [employer].

## 2016-04-08 NOTE — Progress Notes (Signed)
61 y.o. [name] Single  Caucasian Fe here for annual exam. Denies vaginal bleeding or vaginal dryness. Using coconut oil for vaginal dryness. Using medication as instructed. Sees Dr. Maudie Mercury PCP for hypertension/glucose,cholesterol management and sarcoidosis follow up. Saw GI in last year for EGD and colonoscopy. Continues coconut oil for sporadic itching only. Stopped Nystatin powder due to itching and white stuff on skin. Scratches area when needed. Bathes every hs, but only brushes teeth in am. Wearing same outfit as before, but states" she washes clothes if needed". Currently checking with neighbor on [employer] transportation if no one available. No other health issues today.  Patient's last menstrual period was 01/21/1992 (approximate).          Sexually active: No.  The current method of family planning is status post hysterectomy.    Exercising: Yes.    some walking Smoker:  no  Health Maintenance: Pap:  01-16-14 neg MMG: 09-19-15 category a density birads 1:neg Colonoscopy:  2017 negative BMD: patient feels she had one several years ago will check with neighbor TDaP:  2016 Shingles: 2016 Pneumonia: 2011 Hep C and HIV: hep c neg 2017, HIV neg 2016 Labs: pcp Self breast exam: pt checks them   reports that she has never smoked. She has never used smokeless tobacco. She reports that she does not drink alcohol or use drugs.  Past Medical History:  Diagnosis Date  . Allergic rhinitis   . Allergy   . Anemia   . Chronic low back pain   . DDD (degenerative disc disease), lumbar   . GERD (gastroesophageal reflux disease)    w/ hx diaphragmatic hernia  . Glaucoma   . GLAUCOMA NOS 03/11/2006   Qualifier: Diagnosis of  By: Diona Browner MD, Amy    . Heart murmur   . Hx of fracture    R arm, foot  . Hyperlipemia   . Hypertension   . IBS (irritable bowel syndrome)   . Low back pain 11/22/2012  . Meningioma ([place])    ant falx, stable on prior imaging  . Moderate intellectual disabilities   . Murmur    . Obesity   . Osteoarthrosis, unspecified whether generalized or localized, ankle and foot   . Sarcoidosis ([place])    difuse bony lesions and LDA, dx by biopsy in 2016  . Transaminitis   . Type II or unspecified type diabetes mellitus without mention of complication, not stated as uncontrolled     Past Surgical History:  Procedure Laterality Date  . CATARACT EXTRACTION    . COLONOSCOPY  04/15/2011  . ELBOW SURGERY     right elbow  . LIPOMA EXCISION Left [id number]   posterior left axilla  . TOTAL ABDOMINAL HYSTERECTOMY  03/05/1992   leiomyoma and cellular leiomyoma    Current Outpatient Prescriptions  Medication Sig Dispense Refill  . acetaminophen (TYLENOL) 500 MG tablet Take 500 mg by mouth as needed (left ear pain). Reported on 04/18/2015    . diltiazem (CARDIZEM CD) 240 MG 24 hr capsule TAKE ONE CAPSULE BY MOUTH EVERY DAY 90 capsule 2  . ferrous sulfate 325 (65 FE) MG tablet TAKE 1 TABLET (325 MG TOTAL) BY MOUTH 2 (TWO) TIMES DAILY WITH A MEAL.  2  . fluticasone (FLONASE) 50 MCG/ACT nasal spray Place into both nostrils daily.    . hydrochlorothiazide (MICROZIDE) 12.5 MG capsule TAKE 1 CAPSULE BY MOUTH ONCE DAILY 90 capsule 1  . hydrocortisone (ANUSOL-HC) 25 MG suppository UNWRAP AND INSERT 1 SUPPOSITORY IN THE RECTUM TWICE A  DAY  1  . hydrocortisone 2.5 % ointment APPLY TO AFFECTED AREA TWICE A DAY  0  . ipratropium (ATROVENT) 0.06 % nasal spray PLACE 2 SPRAYS INTO BOTH NOSTRILS 4 (FOUR) TIMES DAILY. 45 mL 3  . ketoconazole (NIZORAL) 2 % shampoo APPLY TO AFFECTED AREA TWICE A WEEK AS DIRECTED 120 mL 1  . Lactobacillus (FLORAJEN ACIDOPHILUS PO) Take 1 tablet by mouth daily.    Marland Kitchen latanoprost (XALATAN) 0.005 % ophthalmic solution Place 1 drop into both eyes at bedtime.     Marland Kitchen liver oil-zinc oxide (DESITIN) 40 % ointment Apply 1 application topically as needed for irritation.    Marland Kitchen loratadine (CLARITIN) 10 MG tablet Take 10 mg by mouth daily.    . metFORMIN (GLUCOPHAGE) 500 MG tablet  TAKE 1 TABLET BY MOUTH TWICE A DAY WITH A MEAL 180 tablet 1  . miconazole (MICOTIN) 2 % cream Apply 1 application topically 2 (two) times daily.    Marland Kitchen nystatin (MYCOSTATIN/NYSTOP) powder Apply small mount of powder to groin area twice daily. Am/pm light dusting. 30 g 2  . omeprazole (PRILOSEC) 20 MG capsule TAKE 1 CAPSULE (20 MG TOTAL) BY MOUTH 2 (TWO) TIMES DAILY. 180 capsule 3  . potassium chloride SA (KLOR-CON M20) 20 MEQ tablet Take 1 tablet (20 mEq total) by mouth daily. 90 tablet 3  . pravastatin (PRAVACHOL) 40 MG tablet TAKE 1 TABLET BY MOUTH EVERY EVENING 90 tablet 1  . Propylene Glycol (SYSTANE BALANCE) 0.6 % SOLN     . traMADol (ULTRAM) 50 MG tablet Take 50 mg by mouth daily.  0  . UNKNOWN TO PATIENT      No current facility-administered medications for this visit.     Family History  Problem Relation Age of Onset  . Diabetes Mother   . Stroke Mother   . Hypertension Mother   . Stroke Father   . Diabetes Maternal Aunt   . Heart disease Maternal Aunt      (had pacemaker)  . Diabetes Maternal Grandmother   . Stroke Maternal Grandmother   . Hypertension Maternal Grandmother   . Colon cancer Neg Hx     ROS:  Pertinent items are noted in HPI.  Otherwise, a comprehensive ROS was negative.  Exam:   LMP 01/21/1992 (Approximate)    Ht Readings from Last 3 Encounters:  03/21/16 5\' 2"  (1.575 m)  03/21/16 5\' 2"  (1.575 m)  03/18/16 5\' 2"  (1.575 m)    General appearance: alert, cooperative and appears stated age Head: Normocephalic, without obvious abnormality, atraumatic Neck: no adenopathy, supple, symmetrical, trachea midline and thyroid normal to inspection and palpation Lungs: clear to auscultation bilaterally Breasts: normal appearance, no masses or tenderness, No nipple retraction or dimpling, No nipple discharge or bleeding, No axillary or supraclavicular adenopathy Heart: regular rate and rhythm Abdomen: soft, non-tender; no masses,  no organomegaly Extremities:  extremities normal, atraumatic, no cyanosis or edema Skin: Skin color, texture, turgor normal. No rashes or lesions Lymph nodes: Cervical, supraclavicular, and axillary nodes normal. No abnormal inguinal nodes palpated Neurologic: Grossly normal   Pelvic: External genitalia:  no lesions, right vulva enlarged, 2 -3 cm mass palpated unchanged, no redness or tenderness in area or under clitoral area. Right groin area with redness and exudate, wet prep taken, left groin area appears resolving., non tender              Urethra:  normal appearing urethra with no masses, tenderness or lesions  Bartholin's and Skene's: normal                 Vagina: normal appearing vagina with normal color and discharge, no lesions              Cervix: absent              Pap taken: No. Bimanual Exam:  Uterus:  uterus absent              Adnexa: no mass, fullness, tenderness               Rectovaginal: Confirms               Anus:  normal sphincter tone, no lesions   Wet prep: KOH,saline + yeast  Chaperone present:  Yes  A:  Well Woman with normal exam  Menopausal no HRT  Intellectually challenged  Yeast dermatitis of right groin  Vaginal dryness using coconut oil with good response now  No change in right vulva size, sarcoidosis origin, under PCP follow up now  Social concerns with being lonely   Hypertension/diabetes/cholesterol management with PCP  BMD due, patient not sure if scheduled with PCP will advise  P:   Reviewed health and wellness pertinent to exam  Discussed finding again and need to dry area well after bathing and apply coconut oil to protect area once all healed  Rx Diflucan see order with instructions  Continue coconut oil for vaginal dryness to prevent itching  Discussed visiting with other residents if possible to help with being lonely.  Continue follow up with PCP as indicated.  Pap smear as above   counseled on breast self exam, mammography screening, feminine  hygiene, adequate intake of calcium and vitamin D, diet and exercise  return annually or prn  An After Visit Summary was printed and given to the patient.

## 2016-04-13 ENCOUNTER — Other Ambulatory Visit: Payer: Self-pay | Admitting: Family Medicine

## 2016-04-13 NOTE — Progress Notes (Signed)
Encounter reviewed [name], MD   

## 2016-04-15 DIAGNOSIS — H9313 Tinnitus, bilateral: Secondary | ICD-10-CM | POA: Diagnosis not present

## 2016-04-15 DIAGNOSIS — J310 Chronic rhinitis: Secondary | ICD-10-CM | POA: Diagnosis not present

## 2016-04-23 ENCOUNTER — Telehealth: Payer: Self-pay | Admitting: Certified Nurse Midwife

## 2016-04-23 NOTE — Telephone Encounter (Signed)
Spoke with patient. Patient states that she had one episode of vaginal itching last night. Denies any itching today. Advised patient occasional itching is normal. Patient has been using coconut oil pv and around the vaginal opening. Would like to know if she may continue using coconut oil on the inside of her labia and close to the vaginal opening. Advised may continue to use coconut oil daily internally and externally. Patient denies any other symptoms. Reports she is bathing daily and keeping her vaginal area dry. Aware she is doing the right thing.  20 minute phone call with patient.   Routing to provider for final review. Patient agreeable to disposition. Will close encounter.

## 2016-04-23 NOTE — Telephone Encounter (Signed)
Patient called requesting to speak to the nurse about "itching down there."

## 2016-04-24 ENCOUNTER — Ambulatory Visit: Payer: Medicare Other | Admitting: Podiatry

## 2016-04-29 ENCOUNTER — Ambulatory Visit (INDEPENDENT_AMBULATORY_CARE_PROVIDER_SITE_OTHER): Payer: Medicare Other | Admitting: Podiatry

## 2016-04-29 ENCOUNTER — Encounter: Payer: Self-pay | Admitting: Podiatry

## 2016-04-29 VITALS — Ht 62.0 in | Wt 185.0 lb

## 2016-04-29 DIAGNOSIS — M79676 Pain in unspecified toe(s): Secondary | ICD-10-CM

## 2016-04-29 DIAGNOSIS — B351 Tinea unguium: Secondary | ICD-10-CM | POA: Diagnosis not present

## 2016-04-29 NOTE — Progress Notes (Signed)
Patient ID: Linda Thompson, female   DOB: 1955/02/07, 61 y.o.   MRN: [id number] Complaint:  Visit Type: Patient returns to my office for continued preventative foot care services. Complaint: Patient states" my nails have grown long and thick and become painful to walk and wear shoes" Patient has been diagnosed with DM with no foot complications. The patient presents for preventative foot care services. No changes to ROS  Podiatric Exam: Vascular: dorsalis pedis and posterior tibial pulses are palpable bilateral. Capillary return is immediate. Temperature gradient is WNL. Skin turgor WNL  Sensorium: Normal Semmes Weinstein monofilament test. Normal tactile sensation bilaterally. Nail Exam: Pt has thick disfigured discolored nails with subungual debris noted bilateral entire nail hallux through fifth toenails Ulcer Exam: There is no evidence of ulcer or pre-ulcerative changes or infection. Orthopedic Exam: Muscle tone and strength are WNL. No limitations in general ROM. No crepitus or effusions noted. Foot type and digits show no abnormalities. Bony prominences are unremarkable. Skin: No Porokeratosis. No infection or ulcers  Diagnosis:  Onychomycosis, , Pain in right toe, pain in left toes  Treatment & Plan Procedures and Treatment: Consent by patient was obtained for treatment procedures. The patient understood the discussion of treatment and procedures well. All questions were answered thoroughly reviewed. Debridement of mycotic and hypertrophic toenails, 1 through 5 bilateral and clearing of subungual debris. No ulceration, no infection noted.  Return Visit-Office Procedure: Patient instructed to return to the office for a follow up visit 3 months for continued evaluation and treatment.   Gardiner Barefoot DPM

## 2016-04-30 NOTE — Telephone Encounter (Signed)
Has this been completed or is further follow up needed?

## 2016-05-02 NOTE — Telephone Encounter (Signed)
Encounter closed

## 2016-05-05 ENCOUNTER — Telehealth: Payer: Self-pay | Admitting: Certified Nurse Midwife

## 2016-05-05 NOTE — Telephone Encounter (Signed)
Patient wants to speak with the nurse.

## 2016-05-05 NOTE — Telephone Encounter (Signed)
Spoke with patient. Patient states that last night she had one episode of vaginal itching and irritation. Reports today she is not having any itching. Denies any vaginal discharge. Patient is using coconut oil daily. Not using any other creams. Is bathing daily and drying well. Denies any urinary symptoms. Advised patient occasional one time vaginal itching is normal. Advised will need to continue using coconut oil daily, bathing and keeping dry. Will need to ensure she is switching wash clothes after bathing and wiping well after using the restroom. Patient verbalizes understanding. 21 minute phone call with patient.  Routing to provider for final review. Patient agreeable to disposition. Will close encounter.

## 2016-05-09 ENCOUNTER — Other Ambulatory Visit: Payer: Self-pay | Admitting: Gastroenterology

## 2016-05-09 ENCOUNTER — Other Ambulatory Visit: Payer: Self-pay | Admitting: Family Medicine

## 2016-05-14 DIAGNOSIS — E119 Type 2 diabetes mellitus without complications: Secondary | ICD-10-CM | POA: Diagnosis not present

## 2016-05-14 DIAGNOSIS — H04123 Dry eye syndrome of bilateral lacrimal glands: Secondary | ICD-10-CM | POA: Diagnosis not present

## 2016-05-14 DIAGNOSIS — H401131 Primary open-angle glaucoma, bilateral, mild stage: Secondary | ICD-10-CM | POA: Diagnosis not present

## 2016-05-14 DIAGNOSIS — Z961 Presence of intraocular lens: Secondary | ICD-10-CM | POA: Diagnosis not present

## 2016-05-14 LAB — HM DIABETES EYE EXAM

## 2016-05-16 ENCOUNTER — Encounter: Payer: Self-pay | Admitting: Family Medicine

## 2016-05-18 NOTE — Progress Notes (Signed)
HPI:  Linda Thompson is a pleasant 61 y.o. here for follow up. PMH sig for has Meningioma; Diabetes ([place]); Hyperlipemia; Moderate intellectual disabilities; Unspecified glaucoma; Essential hypertension; Allergic rhinitis; GERD; Irritable bowel syndrome; Obesity, morbid ([place]); Sarcoidosis; Arthritis; Risk for falls; and Murmur. She is mainly here for labs today. Reports has been doing well except for her allergies. She has restarted her allergy regimen of allegra daily and flonase and this actually is working pretty well. She does have some occasional sneezing, but no thick sinus congestion, fevers, sob or intolerable symptoms. Denies CP, SOB, DOE. Due for labs. AWV 11/2015  ROS: See pertinent positives and negatives per HPI.  Past Medical History:  Diagnosis Date  . Allergic rhinitis   . Allergy   . Anemia   . Chronic low back pain   . DDD (degenerative disc disease), lumbar   . GERD (gastroesophageal reflux disease)    w/ hx diaphragmatic hernia  . Glaucoma   . GLAUCOMA NOS 03/11/2006   Qualifier: Diagnosis of  By: Diona Browner MD, Amy    . Heart murmur   . Hx of fracture    R arm, foot  . Hyperlipemia   . Hypertension   . IBS (irritable bowel syndrome)   . Low back pain 11/22/2012  . Meningioma ([name])    ant falx, stable on prior imaging  . Moderate intellectual disabilities   . Murmur   . Obesity   . Osteoarthrosis, unspecified whether generalized or localized, ankle and foot   . Sarcoidosis    difuse bony lesions and LDA, dx by biopsy in 2016  . Transaminitis   . Type II or unspecified type diabetes mellitus without mention of complication, not stated as uncontrolled     Past Surgical History:  Procedure Laterality Date  . CATARACT EXTRACTION    . COLONOSCOPY  04/15/2011  . ELBOW SURGERY     right elbow  . LIPOMA EXCISION Left [id number]   posterior left axilla  . TOTAL ABDOMINAL HYSTERECTOMY  03/05/1992   leiomyoma and cellular leiomyoma    Family History  Problem  Relation Age of Onset  . Diabetes Mother   . Stroke Mother   . Hypertension Mother   . Stroke Father   . Diabetes Maternal Aunt   . Heart disease Maternal Aunt      (had pacemaker)  . Diabetes Maternal Grandmother   . Stroke Maternal Grandmother   . Hypertension Maternal Grandmother   . Colon cancer Neg Hx     Social History   Social History  . Marital status: Single    Spouse name: N/A  . Number of children: 0  . Years of education: N/A   Occupational History  . Disabled (from arm fracture)    Social History Main Topics  . Smoking status: Never Smoker  . Smokeless tobacco: Never Used  . Alcohol use No  . Drug use: No  . Sexual activity: No     Comment: Hyst--TAH--unsure if has ovaries   Other Topics Concern  . None   Social History Narrative   Lives at [facility] (on [address] in [place])      [place] for arm fracture      Mental Retardation, but is able to drive to places she is comfortable with and prepare her own meals and manage most of her own affairs.      [name] (neighbor) is POA/HCPOA      Reports she gets regular exercise and tries to  eat healthy     Current Outpatient Prescriptions:  .  acetaminophen (TYLENOL) 500 MG tablet, Take 500 mg by mouth as needed (left ear pain). Reported on 04/18/2015, Disp: , Rfl:  .  diltiazem (CARDIZEM CD) 240 MG 24 hr capsule, TAKE ONE CAPSULE BY MOUTH EVERY DAY, Disp: 90 capsule, Rfl: 2 .  ferrous sulfate 325 (65 FE) MG tablet, TAKE 1 TABLET (325 MG TOTAL) BY MOUTH 2 (TWO) TIMES DAILY WITH A MEAL., Disp: , Rfl: 2 .  ferrous sulfate 325 (65 FE) MG tablet, TAKE 1 TABLET (325 MG TOTAL) BY MOUTH 2 (TWO) TIMES DAILY WITH A MEAL., Disp: 60 tablet, Rfl: 2 .  fluconazole (DIFLUCAN) 150 MG tablet, Take 1 tablet today & repeat 1 tablet in 5 days (on Sunday), Disp: 2 tablet, Rfl: 0 .  fluticasone (FLONASE) 50 MCG/ACT nasal spray, Place into both nostrils daily., Disp: , Rfl:  .   hydrochlorothiazide (MICROZIDE) 12.5 MG capsule, TAKE 1 CAPSULE BY MOUTH ONCE DAILY, Disp: 90 capsule, Rfl: 1 .  hydrocortisone (ANUSOL-HC) 25 MG suppository, UNWRAP AND INSERT 1 SUPPOSITORY IN THE RECTUM TWICE A DAY, Disp: , Rfl: 1 .  hydrocortisone 2.5 % ointment, APPLY TO AFFECTED AREA TWICE A DAY, Disp: , Rfl: 0 .  ipratropium (ATROVENT) 0.06 % nasal spray, PLACE 2 SPRAYS INTO BOTH NOSTRILS 4 (FOUR) TIMES DAILY., Disp: 45 mL, Rfl: 3 .  ketoconazole (NIZORAL) 2 % shampoo, APPLY TO AFFECTED AREA TWICE A WEEK AS DIRECTED, Disp: 120 mL, Rfl: 5 .  Lactobacillus (FLORAJEN ACIDOPHILUS PO), Take 1 tablet by mouth daily., Disp: , Rfl:  .  latanoprost (XALATAN) 0.005 % ophthalmic solution, Place 1 drop into both eyes at bedtime. , Disp: , Rfl:  .  liver oil-zinc oxide (DESITIN) 40 % ointment, Apply 1 application topically as needed for irritation., Disp: , Rfl:  .  loratadine (CLARITIN) 10 MG tablet, Take 10 mg by mouth daily., Disp: , Rfl:  .  metFORMIN (GLUCOPHAGE) 500 MG tablet, TAKE 1 TABLET BY MOUTH TWICE A DAY WITH A MEAL, Disp: 180 tablet, Rfl: 1 .  miconazole (MICOTIN) 2 % cream, Apply 1 application topically 2 (two) times daily., Disp: , Rfl:  .  nystatin (MYCOSTATIN/NYSTOP) powder, Apply small mount of powder to groin area twice daily. Am/pm light dusting., Disp: 30 g, Rfl: 2 .  omeprazole (PRILOSEC) 20 MG capsule, TAKE 1 CAPSULE (20 MG TOTAL) BY MOUTH 2 (TWO) TIMES DAILY., Disp: 180 capsule, Rfl: 1 .  potassium chloride SA (KLOR-CON M20) 20 MEQ tablet, Take 1 tablet (20 mEq total) by mouth daily., Disp: 90 tablet, Rfl: 3 .  pravastatin (PRAVACHOL) 40 MG tablet, TAKE 1 TABLET BY MOUTH EVERY EVENING, Disp: 90 tablet, Rfl: 1 .  Propylene Glycol (SYSTANE BALANCE) 0.6 % SOLN, , Disp: , Rfl:  .  traMADol (ULTRAM) 50 MG tablet, Take 50 mg by mouth daily., Disp: , Rfl: 0 .  UNKNOWN TO PATIENT, , Disp: , Rfl:   EXAM:  Vitals:   05/19/16 0916  BP: 136/88  Pulse: 87  Temp: 98.1 F (36.7 C)     Body mass index is 34.9 kg/m.  GENERAL: vitals reviewed and listed above, alert, oriented, appears well hydrated and in no acute distress  HEENT: atraumatic, conjunttiva clear, no obvious abnormalities on inspection of external nose and ears, normal appearance of ear canals and TMs, clear nasal congestion, mild post oropharyngeal erythema with PND, no tonsillar edema or exudate, no sinus TTP  NECK: no obvious masses on inspection  LUNGS: clear to auscultation bilaterally, no wheezes, rales or rhonchi, good air movement  CV: HRRR, no peripheral edema  MS: moves all extremities without noticeable abnormality  PSYCH: pleasant and cooperative, no obvious depression or anxiety  ASSESSMENT AND PLAN:  Discussed the following assessment and plan:  Type 2 diabetes mellitus without complication, without long-term current use of insulin (HCC) - Plan: Hemoglobin A1c Obesity, morbid (HCC) Hyperlipidemia, unspecified hyperlipidemia type - Plan: Lipid panel Essential hypertension - Plan: Basic metabolic panel, CBC -cont current treatment and advised lifestyle recs - see handout -labs per orders  Allergic rhinitis, unspecified seasonality, unspecified trigger -doing fairly well on current regimen, advised to cont for allergy season  -Patient advised to return or notify a doctor immediately if symptoms worsen or persist or new concerns arise.  Patient Instructions  BEFORE YOU LEAVE: -follow up: 3-4 months with Dr. Maudie Mercury, Medicare exam with Manuela Schwartz in November -labs  Continue your allergy medications and other medication.  We have ordered labs or studies at this visit. It can take up to 1-2 weeks for results and processing. IF results require follow up or explanation, we will call you with instructions. Clinically stable results will be released to your [facility]. If you have not heard from [place] or cannot find your results in [facility] in 2 weeks please contact our office at [phone].  If you  are not yet signed up for [facility], please consider signing up.   We recommend the following healthy lifestyle for LIFE: 1) Small portions.   Tip: eat off of a salad plate instead of a dinner plate.   Tip: if you need more or a snack choose fruits, veggies and/or a handful of nuts or seeds.  2) Eat a healthy clean diet.  * Tip: Avoid (less then 1 serving per week): processed foods, sweets, sweetened drinks, white starches (rice, flour, bread, potatoes, pasta, etc), red meat, fast foods, butter  *Tip: CHOOSE instead   * 5-9 servings per day of fresh or frozen fruits and vegetables (but not corn, potatoes, bananas, canned or dried fruit)   *nuts and seeds, beans   *olives and olive oil   *small portions of lean meats such as fish and white chicken    *small portions of whole grains  3)Get at least 150 minutes of sweaty aerobic exercise per week.  4)Reduce stress - consider counseling, meditation and relaxation to balance other aspects of your life.        [name] R., DO

## 2016-05-19 ENCOUNTER — Ambulatory Visit (INDEPENDENT_AMBULATORY_CARE_PROVIDER_SITE_OTHER): Payer: Medicare Other | Admitting: Family Medicine

## 2016-05-19 ENCOUNTER — Encounter: Payer: Self-pay | Admitting: Family Medicine

## 2016-05-19 VITALS — BP 136/88 | HR 87 | Temp 98.1°F | Ht 62.0 in | Wt 190.8 lb

## 2016-05-19 DIAGNOSIS — E119 Type 2 diabetes mellitus without complications: Secondary | ICD-10-CM

## 2016-05-19 DIAGNOSIS — I10 Essential (primary) hypertension: Secondary | ICD-10-CM | POA: Diagnosis not present

## 2016-05-19 DIAGNOSIS — J309 Allergic rhinitis, unspecified: Secondary | ICD-10-CM

## 2016-05-19 DIAGNOSIS — R7989 Other specified abnormal findings of blood chemistry: Secondary | ICD-10-CM

## 2016-05-19 DIAGNOSIS — E785 Hyperlipidemia, unspecified: Secondary | ICD-10-CM | POA: Diagnosis not present

## 2016-05-19 LAB — LIPID PANEL
Cholesterol: 176 mg/dL (ref 0–200)
HDL: 37.1 mg/dL — ABNORMAL LOW (ref >39.00)
NonHDL: 138.85
Total CHOL/HDL Ratio: 5
Triglycerides: 273 mg/dL — ABNORMAL HIGH (ref 0.0–149.0)
VLDL: 54.6 mg/dL — ABNORMAL HIGH (ref 0.0–40.0)

## 2016-05-19 LAB — BASIC METABOLIC PANEL
BUN: 12 mg/dL (ref 6–23)
CALCIUM: 9 mg/dL (ref 8.4–10.5)
CHLORIDE: 104 meq/L (ref 96–112)
CO2: 29 meq/L (ref 19–32)
CREATININE: 0.52 mg/dL (ref 0.40–1.20)
GFR: 127.08 mL/min (ref >60.00)
Glucose, Bld: 93 mg/dL (ref 70–99)
Potassium: 3.9 mEq/L (ref 3.5–5.1)
Sodium: 141 mEq/L (ref 135–145)

## 2016-05-19 LAB — CBC
HEMATOCRIT: 46.6 % — AB (ref 36.0–46.0)
Hemoglobin: 15.3 g/dL — ABNORMAL HIGH (ref 12.0–15.0)
MCHC: 32.9 g/dL (ref 30.0–36.0)
MCV: 89.7 fl (ref 78.0–100.0)
Platelets: 334 10*3/uL (ref 150.0–400.0)
RBC: 5.19 Mil/uL — ABNORMAL HIGH (ref 3.87–5.11)
RDW: 14.5 % (ref 11.5–15.5)
WBC: 6.5 10*3/uL (ref 4.0–10.5)

## 2016-05-19 LAB — LDL CHOLESTEROL, DIRECT: LDL DIRECT: 95 mg/dL

## 2016-05-19 LAB — HEMOGLOBIN A1C: HEMOGLOBIN A1C: 6.4 % (ref 4.6–6.5)

## 2016-05-19 NOTE — Progress Notes (Signed)
Pre visit review using our clinic review tool, if applicable. No additional management support is needed unless otherwise documented below in the visit note. 

## 2016-05-19 NOTE — Patient Instructions (Signed)
BEFORE YOU LEAVE: -follow up: 3-4 months with Dr. Maudie Mercury, Medicare exam with Manuela Schwartz in November -labs  Continue your allergy medications and other medication.  We have ordered labs or studies at this visit. It can take up to 1-2 weeks for results and processing. IF results require follow up or explanation, we will call you with instructions. Clinically stable results will be released to your [facility]. If you have not heard from [place] or cannot find your results in [facility] in 2 weeks please contact our office at [phone].  If you are not yet signed up for [facility], please consider signing up.   We recommend the following healthy lifestyle for LIFE: 1) Small portions.   Tip: eat off of a salad plate instead of a dinner plate.   Tip: if you need more or a snack choose fruits, veggies and/or a handful of nuts or seeds.  2) Eat a healthy clean diet.  * Tip: Avoid (less then 1 serving per week): processed foods, sweets, sweetened drinks, white starches (rice, flour, bread, potatoes, pasta, etc), red meat, fast foods, butter  *Tip: CHOOSE instead   * 5-9 servings per day of fresh or frozen fruits and vegetables (but not corn, potatoes, bananas, canned or dried fruit)   *nuts and seeds, beans   *olives and olive oil   *small portions of lean meats such as fish and white chicken    *small portions of whole grains  3)Get at least 150 minutes of sweaty aerobic exercise per week.  4)Reduce stress - consider counseling, meditation and relaxation to balance other aspects of your life.

## 2016-05-26 ENCOUNTER — Telehealth: Payer: Self-pay | Admitting: Certified Nurse Midwife

## 2016-05-26 NOTE — Telephone Encounter (Signed)
Patient wants to speak with [facility] no information given.

## 2016-05-26 NOTE — Telephone Encounter (Signed)
Spoke with patient. Patient states that she bought a new jar of solid coconut oil. Left the jar out and it turned into a liquid. Asking if she can still use this vaginally. Advised can still use vaginally, but is harder to place inside the vagina. May place the coconut oil in the fridge to help turn the coconut oil back into a solid. Patient verbalizes understanding. Aware she does not have to get rid of the coconut oil because it melted. May need to keep coconut oil in the fridge in between uses to keep it a solid. Aware to be careful to not mix this up with other cooking products. Patient verbalizes understanding.  Routing to provider for final review. Patient agreeable to disposition. Will close encounter.

## 2016-06-02 ENCOUNTER — Telehealth: Payer: Self-pay | Admitting: Certified Nurse Midwife

## 2016-06-02 NOTE — Telephone Encounter (Signed)
Patient has a question about using coconut oil.

## 2016-06-03 ENCOUNTER — Encounter: Payer: Self-pay | Admitting: Certified Nurse Midwife

## 2016-06-03 ENCOUNTER — Ambulatory Visit (INDEPENDENT_AMBULATORY_CARE_PROVIDER_SITE_OTHER): Payer: Medicare Other | Admitting: Certified Nurse Midwife

## 2016-06-03 VITALS — BP 116/80 | HR 68 | Resp 16 | Ht 62.0 in | Wt 189.0 lb

## 2016-06-03 DIAGNOSIS — B372 Candidiasis of skin and nail: Secondary | ICD-10-CM | POA: Diagnosis not present

## 2016-06-03 MED ORDER — FLUCONAZOLE 150 MG PO TABS: ORAL_TABLET | ORAL | 0 refills | Status: DC

## 2016-06-03 NOTE — Telephone Encounter (Signed)
Patient returning your call.

## 2016-06-03 NOTE — Progress Notes (Signed)
61 y.o. Single Caucasian female [name] here with complaint of redness in groin area increasing on left side and some on right side. Has been using coconut oil for skin protection with increase wetness in that area due to being hot. Changes clothes as needed, but has no summer clothes other than short sleeve. She prefers pants all the time. Denies any vaginal itching or discharge change. Uses coconut oil for dryness with good results. No rectal concerns.Patient is not sexually active and menopausal. Denies increase in incontinence or bowel issues. Living arrangements same but has had offers for help now. Still driving without problems. Recent visit with Dr.Kim, all "OK". No other health issues today.  ROS  Pertinent to HPI  O:Healthy female WDWN Affect: normal, orientation x 3  Exam:Skin:warm and dry Abdomen:soft, non tender, no masses  Inguinal Lymph nodes: no enlargement or tenderness Pelvic exam: External genital: normal female, no lesions, groin area on left red with slight exudate, slightly wet, wet prep take, groin area on right slightly red, small area near vulva only, scant exudate wet prep taken BUS: negative Vulva on left normal appearance, on right known enlargement of 2-3 cm mass(sarcoid), no change noted, patient does not feel has changed Vagina: scant discharge noted. No rednessAffirm taken Pelvic exam deferred per request   Wet Prep results: KOH,Saline positive for yeast only   A:Normal limited pelvic exam Yeast dermatitis Known enlarged right vulva primary site of sarcoid no change Intellectually challenged   P:Discussed findings of yeast dermatitis and etiology. Discussed she has had this before and she is doing good job protecting other skin in vaginal area. She has continued to avoid powder use as recommended. Bathes once daily and more if bowel accident( none in a while)   Avoid moist clothes or pads for extended period of time. Continue coconut oil use as discussed for  protection of skin. Rx Diflucan see order with instructions. Patient given detailed printed instructions and repeated back to me.  Rv prn

## 2016-06-03 NOTE — Telephone Encounter (Signed)
Patient called this morning states she is breaking out thinks it is from the coconut oil and would like to speak to a nurse.

## 2016-06-03 NOTE — Patient Instructions (Signed)
Go with out underwear at night.  Use coconut oil on red area to protect skin just as you have been doing No powder use

## 2016-06-03 NOTE — Telephone Encounter (Signed)
Left message to call Kaitlyn at [phone]. 

## 2016-06-03 NOTE — Telephone Encounter (Signed)
Spoke with patient. Patient states that she has been using coconut oil nightly. Bathing nightly and keeping very dry. Has noticed a rash in the crease of thigh. Reports area is red and sore. Denies use of any cream or new soap. Patient is requesting to be seen for further evaluation. Appointment scheduled for today 06/03/2016 at 10:30 am with [name]. Patient is agreeable to date and time.  Routing to provider for final review. Patient agreeable to disposition. Will close encounter.

## 2016-06-09 ENCOUNTER — Telehealth: Payer: Self-pay | Admitting: Certified Nurse Midwife

## 2016-06-09 NOTE — Telephone Encounter (Signed)
Spoke with patient. Patient calling to review diflucan instructions. Patient reports taking one diflucan day of OV, 5/15, second dose on Sunday, 5/20, will take next dose on Sunday, 5/27. Patient reports using coconut oil. Patient reports area is looking better. Advised patient would update Melvia Heaps, CNM when she returns on 5/22. Advised patient to return call to office with questions/concerns. Patient verbalizes understanding, read back diflucan instructions. Length of call, 15 minutes.  Routing to provider for final review. Patient is agreeable to disposition. Will close encounter.

## 2016-06-09 NOTE — Telephone Encounter (Signed)
Patient is asking to talk with Debbi's nurse.

## 2016-06-12 ENCOUNTER — Telehealth: Payer: Self-pay | Admitting: Certified Nurse Midwife

## 2016-06-12 NOTE — Telephone Encounter (Signed)
Spoke with patient. Patient states that she was seen by a home health nurse and was told she could eat yogurt daily to help with yeast infections. Asking if she can eat yogurt. Advised she may eat yogurt daily, but will need to ensure she is not eating a yogurt high in sugar content. Patient verbalizes understanding. States she was also advised she can use OTC medication for yeast infection. Advised patient she is already taking Diflucan which will treat the yeast. Does not need to use OTC medication. Advised may use coconut oil externally and will need to take next dose of Diflucan on Sunday. Patient is agreeable. Patient is concerned about using medication recommended by the nurse if it is not needed. Advise no further medication is needed at this time. Patient is agreeable. 14 minute phone call with patient.  Routing to provider for final review. Patient agreeable to disposition. Will close encounter.

## 2016-06-12 NOTE — Telephone Encounter (Signed)
Patient states she has a nurse that will be coming to her house to help her shower and assist with other things. The nurse has suggested she eat yogurt and she wants to know if this will help with yeast infections. She wants to make sure Debbi agrees with this before she starts doing it.   Routing to triage.

## 2016-06-13 ENCOUNTER — Telehealth: Payer: Self-pay | Admitting: Certified Nurse Midwife

## 2016-06-13 NOTE — Telephone Encounter (Signed)
Spoke with patient. Patient wanted to ensure RN provided [name] with information from yesterday's telephone call. Advised this was reviewed with [name] and she is agreeable. Patient verbalizes understanding.  Routing to provider for final review. Patient agreeable to disposition. Will close encounter.

## 2016-06-13 NOTE — Telephone Encounter (Signed)
Patient called during lunch and left a message requesting a call back from [place]. She did not leave any additional details.

## 2016-06-24 ENCOUNTER — Telehealth: Payer: Self-pay | Admitting: Certified Nurse Midwife

## 2016-06-24 NOTE — Telephone Encounter (Signed)
Forwarding to D. Hollice Espy.

## 2016-06-24 NOTE — Telephone Encounter (Signed)
Patient calling to speak with nurse regarding using medication over the counter.

## 2016-06-24 NOTE — Telephone Encounter (Signed)
Spoke with patient. Patient states that she has an area in the crease of her leg where she has a red rash that is "sweating." Reports she has been applying coconut oil to the area as well as using coconut oil vaginally daily. Has been showering daily and staying dry. Reports she has slight redness under her abdomen and on the crease of the other leg. Patient has a nurse coming to her home tomorrow for a health visit and states "I know if I show her this she will want me to use an over the counter medicine. The doctor told me not to use anything over the counter so I do not know what to do. It is getting me confused." Advised will review with [name] and return call with further recommendations. Patient is agreeable.

## 2016-06-24 NOTE — Telephone Encounter (Signed)
She needs  OV to check if no better I will refer to dermatology

## 2016-06-25 NOTE — Telephone Encounter (Signed)
Spoke with patient. Advised of message as seen below from Linda Thompson. Patient verbalizes understanding. Appointment scheduled for tomorrow 06/26/2016 at 11 am with [name]. Patient is agreeable to date and time. 15 minute phone call.

## 2016-06-26 ENCOUNTER — Encounter: Payer: Self-pay | Admitting: Certified Nurse Midwife

## 2016-06-26 ENCOUNTER — Telehealth: Payer: Self-pay | Admitting: Certified Nurse Midwife

## 2016-06-26 ENCOUNTER — Ambulatory Visit (INDEPENDENT_AMBULATORY_CARE_PROVIDER_SITE_OTHER): Payer: Medicare Other | Admitting: Certified Nurse Midwife

## 2016-06-26 ENCOUNTER — Telehealth: Payer: Self-pay

## 2016-06-26 VITALS — BP 110/74 | HR 68 | Resp 16 | Ht 62.0 in | Wt 186.0 lb

## 2016-06-26 DIAGNOSIS — B372 Candidiasis of skin and nail: Secondary | ICD-10-CM | POA: Diagnosis not present

## 2016-06-26 MED ORDER — FLUCONAZOLE 150 MG PO TABS: ORAL_TABLET | ORAL | 0 refills | Status: DC

## 2016-06-26 NOTE — Telephone Encounter (Signed)
Spoke with patient at time of incoming call. States that she went to the pharmacy to pick up remainder of Diflucan tablets and was advised since she picked up one tablet today her insurance will not cover her to have more Diflucan until next Wednesday 07/02/2016. Patient will pick up rx next week and is aware she will need to take her next dose on 07/03/2016. Patient will call if she needs assistance.  Routing to provider for final review. Patient agreeable to disposition. Will close encounter.

## 2016-06-26 NOTE — Telephone Encounter (Signed)
Spoke with patient. Patient states that she went to the pharmacy and picked up her rx for Diflucan and was only given 1 pill. Patient is to take Diflucan once per week for 4 weeks. Patient already had 1 pill at home, now has 2 total. New rx for Diflucan 150 mg #2 0RF sent to pharmacy on file so that they will dispense 2 more tablets for the patient. She is aware she will need to take 1 tablet weekly for 4 weeks. Patient verbalizes understanding.   Routing to provider for final review. Patient agreeable to disposition. Will close encounter.

## 2016-06-26 NOTE — Telephone Encounter (Signed)
Patient calling with questions about her medication  °

## 2016-06-26 NOTE — Patient Instructions (Signed)
No more coconut oil in groin area or on side on under stomach. Dry area after bathing. Try to wear short gown or snap house dress when not going outside to allow area to dry. No other the counter products to area, these have been used in the past with problems.  Take Pink pill for skin problem once a week on Thursday, will start today For 4 weeks.  Would like to recheck in office if not clearing.  Call if questions.

## 2016-06-26 NOTE — Progress Notes (Signed)
61 y.o. Single Caucasian female [name] here with complaint of sweating under abdomen and becomes slightly itchy, on right side. Left side has stopped and feels so much better. Feels like the same problem as before. Denies fever or chills. Also has not seen any sores in the area. Has been applying coconut oil to area, she thought would protect. Denies any other new products or OTC medications to area. Patient is a diabetic and now is working with a Therapist, sports from [employer] twice weekly with appropriate foods to eat and helps with grocery shopping. She is also working with exercise to help with muscle weakness. Patient now has someone to clean apartment once monthly. Patient "my attorney and secretary helped to have this assistance for me". Patient in new outfit today. Denies any vaginal symptoms, still using coconut oil with good results in vaginal area. Denies bladder or bowel issues. Sees PCP [name] every 6 months for medication follow up. No other concerns today.   ROS Pertinent to HPI  O:Healthy female WDWN Affect: normal, orientation x 3  Exam:Skin: warm and dry Abdomen:soft no masses  Inguinal Lymph nodes: no enlargement or tenderness Skin: skin under abdominal flap on left red, with fine rash with exudate noted. Yeast appearance. No pustules, or broken skin, weepy appearance. No rash noted on right, normal skin appearance with skin scarring for reoccurrence in past., No odor to exudate, area extends into left groin area mid way only. Pelvic exam: External genital: normal female, no atrophic appearance, no scaling or exudate. Right vulva still enlarged but no change  BUS: negative. No vaginal or pelvic exam felt necessary.   A: Yeast dermatitis of groin and under abdominal flap Normal external vaginal exam    P:Discussed findings of yeast dermatitis and etiology. Discussed this is the same as prior visit but on the left side. Patient bathing daily and two days a week supervised with  homehealth RN .Discussed with patient concerned coconut oil may increasing moisture and weeping and needs to stop using on abdominal skin area. Can continue in vaginal area as discussed. Encouraged to limit long pant use to let air to area to heal and dry. Suggested house dress or light weight robe when she is home. Patient will work with home health nurse and see what she can do. Questions addressed. Repeat instructions of Rx to be given and written in note for patient with instructions for skin care.  Rx Diflucan 150 mg see order once weekly for 4 weeks, continue yogurt in diet also.  Rv prn or if not improving. Voiced understanding. Rv prn

## 2016-06-27 ENCOUNTER — Other Ambulatory Visit: Payer: Self-pay | Admitting: Family Medicine

## 2016-06-30 ENCOUNTER — Telehealth: Payer: Self-pay | Admitting: Certified Nurse Midwife

## 2016-06-30 NOTE — Telephone Encounter (Signed)
Patient wants to speak with the nurse.

## 2016-06-30 NOTE — Telephone Encounter (Signed)
Spoke with patient. Patient would like Linda Thompson CNM to know she picked up the two extra tablets of her Diflucan and will take her second dose on Thursday as directed. Also would like Linda Thompson CNM to know she went with home health nurse to get gowns today. Now has 3. Ensure patient she is doing the right thing and I will let Linda Thompson CNM know. Patient is agreeable. 15 minute phone call with patient.  Routing to provider for final review. Patient agreeable to disposition. Will close encounter.

## 2016-07-02 ENCOUNTER — Other Ambulatory Visit: Payer: Self-pay | Admitting: Obstetrics and Gynecology

## 2016-07-02 DIAGNOSIS — B372 Candidiasis of skin and nail: Secondary | ICD-10-CM

## 2016-07-02 NOTE — Telephone Encounter (Signed)
Medication refill request: Fluconazole Last AEX:  04/08/16 DL Next AEX: 04/09/17 DL Last MMG (if hormonal medication request): 09/19/15 [id number], Density A, [facility] Refill authorized: 06/26/16 #2 0R. Please advise. Thank you.

## 2016-07-07 ENCOUNTER — Telehealth: Payer: Self-pay | Admitting: Certified Nurse Midwife

## 2016-07-07 ENCOUNTER — Telehealth: Payer: Self-pay

## 2016-07-07 NOTE — Telephone Encounter (Signed)
Patient called sates was treated two weeks ago for a yeast infection.  States she is breaking out now and requests to speak with a nurse.  Routing to Nurse Triage

## 2016-07-07 NOTE — Telephone Encounter (Signed)
Spoke with patient. Patient states that she has taken 2 doses of Diflucan as directed. Is taking 1 tablet per week on Thursday. Reports she feels her symptoms are getting worse. "Some areas are getting better but others look worse." Patient has been bathing daily, keeping dry, and wearing night gowns as directed by [name]. Appointment for follow up scheduled for 07/08/2016 at 11:15 am with Kem Boroughs, FNP as [name] is out of the office. Patient is agreeable to date and time.  Routing to provider for final review. Patient agreeable to disposition. Will close encounter.

## 2016-07-07 NOTE — Telephone Encounter (Signed)
She I can refill it, but she needs to follow up with me or Dr. Arsenio Loader, for history of gastric polyps causing anemia - he removed a very large stomach polyp which was pre-cancerous. I'm happy to see her back in follow up if you can help coordinate it. Thanks

## 2016-07-07 NOTE — Telephone Encounter (Signed)
Attempted to reach patient. Phone rang and rang with no answer. Not able to leave voicemail.

## 2016-07-07 NOTE — Telephone Encounter (Signed)
Pt is requesting refill of ferrous sulfate 325mg  BID  Are you ok to refill?  Last visit was in 03/2015  Send to [facility] on [address] [phone]

## 2016-07-08 ENCOUNTER — Ambulatory Visit (INDEPENDENT_AMBULATORY_CARE_PROVIDER_SITE_OTHER): Payer: Medicare Other | Admitting: Nurse Practitioner

## 2016-07-08 ENCOUNTER — Other Ambulatory Visit: Payer: Self-pay

## 2016-07-08 ENCOUNTER — Encounter: Payer: Self-pay | Admitting: Nurse Practitioner

## 2016-07-08 VITALS — BP 118/72 | HR 64 | Resp 12 | Ht 62.0 in | Wt 186.2 lb

## 2016-07-08 DIAGNOSIS — B372 Candidiasis of skin and nail: Secondary | ICD-10-CM | POA: Diagnosis not present

## 2016-07-08 MED ORDER — FERROUS SULFATE 325 (65 FE) MG PO TABS: ORAL_TABLET | ORAL | 2 refills | Status: DC

## 2016-07-08 NOTE — Patient Instructions (Signed)
Desitin cream twice a day.

## 2016-07-08 NOTE — Progress Notes (Signed)
GYNECOLOGY  VISIT   HPI: 61 y.o.   Single  Caucasian  female  [name] with Patient's last menstrual period was 01/21/1992 (approximate).   Patient presents for a recurrent yeast infections.   Pt has complied about getting short gowns and wearing them at night.  She feels she gets so hot and then finds herself scratching during the night.  She is taking Diflucan as prescribed which is 1 tablet weekly X 4.  On Thursday will be her 3 rd dose.  She feels that sweating along the lower abdomen and groin is causing the irritation. She is using no external cream other than coconut oil to the inside labia.  GYNECOLOGIC HISTORY: Patient's last menstrual period was 01/21/1992 (approximate). Contraception:Hysterectomy Menopausal hormone therapy: none        OB History    Gravida Para Term Preterm AB Living   0 0 0 0 0 0   SAB TAB Ectopic Multiple Live Births   0 0 0 0           Patient Active Problem List   Diagnosis Date Noted  . Murmur   . Risk for falls 12/25/2015  . Arthritis 12/03/2015  . Sarcoidosis 10/27/2014  . Obesity, morbid ([place]) 05/05/2011  . Irritable bowel syndrome 03/05/2010  . Diabetes ([place]) 08/13/2007  . Allergic rhinitis 09/23/2006  . Hyperlipemia 03/11/2006  . Moderate intellectual disabilities 03/11/2006  . Unspecified glaucoma 03/11/2006  . Essential hypertension 03/11/2006  . GERD 03/11/2006  . Meningioma 12/20/2001    Past Medical History:  Diagnosis Date  . Allergic rhinitis   . Allergy   . Anemia   . Chronic low back pain   . DDD (degenerative disc disease), lumbar   . GERD (gastroesophageal reflux disease)    w/ hx diaphragmatic hernia  . Glaucoma   . GLAUCOMA NOS 03/11/2006   Qualifier: Diagnosis of  By: Diona Browner MD, Amy    . Heart murmur   . Hx of fracture    R arm, foot  . Hyperlipemia   . Hypertension   . IBS (irritable bowel syndrome)   . Low back pain 11/22/2012  . Meningioma ([place])    ant falx, stable on prior imaging  . Moderate intellectual  disabilities   . Murmur   . Obesity   . Osteoarthrosis, unspecified whether generalized or localized, ankle and foot   . Sarcoidosis    difuse bony lesions and LDA, dx by biopsy in 2016  . Transaminitis   . Type II or unspecified type diabetes mellitus without mention of complication, not stated as uncontrolled     Past Surgical History:  Procedure Laterality Date  . CATARACT EXTRACTION    . COLONOSCOPY  04/15/2011  . ELBOW SURGERY     right elbow  . LIPOMA EXCISION Left [id number]   posterior left axilla  . TOTAL ABDOMINAL HYSTERECTOMY  03/05/1992   leiomyoma and cellular leiomyoma    Current Outpatient Prescriptions  Medication Sig Dispense Refill  . acetaminophen (TYLENOL) 500 MG tablet Take 500 mg by mouth as needed (left ear pain). Reported on 04/18/2015    . diltiazem (CARDIZEM CD) 240 MG 24 hr capsule TAKE ONE CAPSULE BY MOUTH EVERY DAY 90 capsule 2  . ferrous sulfate 325 (65 FE) MG tablet TAKE 1 TABLET (325 MG TOTAL) BY MOUTH 2 (TWO) TIMES DAILY WITH A MEAL. 60 tablet 2  . fluconazole (DIFLUCAN) 150 MG tablet TAKE 1 TABLET BY MOUTH ONCE WEEKLY FOR 2 WEEKS 2 tablet 0  .  fluticasone (FLONASE) 50 MCG/ACT nasal spray Place into both nostrils daily.    . hydrochlorothiazide (MICROZIDE) 12.5 MG capsule TAKE 1 CAPSULE BY MOUTH ONCE DAILY 90 capsule 1  . hydrocortisone (ANUSOL-HC) 25 MG suppository UNWRAP AND INSERT 1 SUPPOSITORY IN THE RECTUM TWICE A DAY  1  . hydrocortisone 2.5 % ointment APPLY TO AFFECTED AREA TWICE A DAY  0  . ipratropium (ATROVENT) 0.06 % nasal spray PLACE 2 SPRAYS INTO BOTH NOSTRILS 4 (FOUR) TIMES DAILY. 45 mL 2  . ketoconazole (NIZORAL) 2 % shampoo APPLY TO AFFECTED AREA TWICE A WEEK AS DIRECTED 120 mL 5  . Lactobacillus (FLORAJEN ACIDOPHILUS PO) Take 1 tablet by mouth daily.    Marland Kitchen latanoprost (XALATAN) 0.005 % ophthalmic solution Place 1 drop into both eyes at bedtime.     Marland Kitchen liver oil-zinc oxide (DESITIN) 40 % ointment Apply 1 application topically as needed for  irritation.    . Loperamide HCl 1 MG/7.5ML SUSP Take 1 mg by mouth.    . loratadine (CLARITIN) 10 MG tablet Take 10 mg by mouth daily.    . metFORMIN (GLUCOPHAGE) 500 MG tablet TAKE 1 TABLET BY MOUTH TWICE A DAY WITH A MEAL 180 tablet 1  . omeprazole (PRILOSEC) 20 MG capsule TAKE 1 CAPSULE (20 MG TOTAL) BY MOUTH 2 (TWO) TIMES DAILY. 180 capsule 1  . potassium chloride SA (KLOR-CON M20) 20 MEQ tablet Take 1 tablet (20 mEq total) by mouth daily. 90 tablet 3  . pravastatin (PRAVACHOL) 40 MG tablet TAKE 1 TABLET BY MOUTH EVERY EVENING 90 tablet 1  . Propylene Glycol (SYSTANE BALANCE) 0.6 % SOLN     . traMADol (ULTRAM) 50 MG tablet Take 50 mg by mouth daily.  0  . UNKNOWN TO PATIENT      No current facility-administered medications for this visit.      ALLERGIES: Ace inhibitors; Amoxicillin-pot clavulanate; Augmentin [amoxicillin-pot clavulanate]; Azithromycin; Ceftin [cefuroxime axetil]; Cefuroxime; Cyclobenzaprine hcl; Flexeril [cyclobenzaprine]; Flonase [fluticasone propionate]; Fluticasone; and Ibuprofen  Family History  Problem Relation Age of Onset  . Diabetes Mother   . Stroke Mother   . Hypertension Mother   . Stroke Father   . Diabetes Maternal Aunt   . Heart disease Maternal Aunt         (had pacemaker)  . Diabetes Maternal Grandmother   . Stroke Maternal Grandmother   . Hypertension Maternal Grandmother   . Colon cancer Neg Hx     Social History   Social History  . Marital status: Single    Spouse name: N/A  . Number of children: 0  . Years of education: N/A   Occupational History  . Disabled (from arm fracture)    Social History Main Topics  . Smoking status: Never Smoker  . Smokeless tobacco: Never Used  . Alcohol use No  . Drug use: No  . Sexual activity: No     Comment: Hyst--TAH--unsure if has ovaries   Other Topics Concern  . Not on file   Social History Narrative   Lives at [facility] (on [address] in [place])       [place] for arm fracture      Mental Retardation, but is able to drive to places she is comfortable with and prepare her own meals and manage most of her own affairs.      [name] (neighbor) is POA/HCPOA      Reports she gets regular exercise and tries to eat healthy    ROS  PHYSICAL EXAMINATION:    BP 118/72 (BP Location: Right Arm, Patient Position: Sitting, Cuff Size: Normal)   Pulse 64   Resp 12   Ht 5\' 2"  (1.575 m)   Wt 186 lb 3.2 oz (84.5 kg)   LMP 01/21/1992 (Approximate)   BMI 34.06 kg/m     General appearance: alert, cooperative and appears stated age   Pelvic: External genitalia:  Multiple areas under the abdominal folds and groin consistent with yeast infection.              Urethra:  normal appearing urethra with no masses, tenderness or lesions              Bartholin's and Skene's: normal                 Vagina: normal appearing vagina with normal color and discharge, no lesions              Cervix:               Bimanual Exam:  Uterus:  uterus absent              Adnexa: no mass, fullness, tenderness               Chaperone was present for exam: Joy  ASSESSMENT    History of chronic yeast - on treatment  Yeast infection of the groin and under abdominal folds    PLAN  She has had Nystatin powder in the past and Joy states this did not work due to getting clumped.  She has used Desitin cream 40% and has some with her.  She is instructed to use that twice a day after cleaning and keep area dry.  An After Visit Summary was printed and given to the patient.

## 2016-07-09 ENCOUNTER — Telehealth: Payer: Self-pay | Admitting: Certified Nurse Midwife

## 2016-07-09 ENCOUNTER — Other Ambulatory Visit: Payer: Self-pay

## 2016-07-09 MED ORDER — FERROUS SULFATE 325 (65 FE) MG PO TABS: ORAL_TABLET | ORAL | 2 refills | Status: DC

## 2016-07-09 NOTE — Telephone Encounter (Signed)
Spoke with patient. Patient was seen on 07/08/2016 with Kem Boroughs, FNP. Patient states that she was advised to take 1 Diflucan on Thursday for the next two weeks to complete her full 4 weeks of 1 diflucan per week. Was also advised she may use Desitin cream twice per day. States she has been having some rectal itching and has been placing Desitin cream on rectal area as well. Asking if this is okay. Advised okay to apply small amount of Desitin cream to rectal area as well. Patient verbalizes understanding.   Routing to provider for final review. Patient agreeable to disposition. Will close encounter.

## 2016-07-09 NOTE — Telephone Encounter (Signed)
Pt wishes to be followed by Dr. Havery Moros for anemia. Follow up made m=for 8/15 at 10:30am. Iron refilled.

## 2016-07-09 NOTE — Telephone Encounter (Signed)
Patient has a question for the nurse about medication.

## 2016-07-10 ENCOUNTER — Telehealth: Payer: Self-pay | Admitting: Certified Nurse Midwife

## 2016-07-10 NOTE — Telephone Encounter (Signed)
Spoke with patient. Patient states that she has been having soreness around her rectum due to itching. Has been using Desitin as directed by Kem Boroughs, FNP. Advised she may continue applying Desitin vaginally and  rectally as needed. Advised using Desitin should provide her with relief. Patient is agreeable.  Routing to provider for final review. Patient agreeable to disposition. Will close encounter.

## 2016-07-10 NOTE — Progress Notes (Signed)
Reviewed personally.  M. Suzanne Starlee Corralejo, MD.  

## 2016-07-10 NOTE — Telephone Encounter (Signed)
Spoke with patient. Patient states that her rectum was irritated and itching this afternoon. Had a bowel movement and used unscented baby wipes to cleanse bottom with. Reports she is no longer having any symptoms. Is applying small amount of Desitin cream as needed vaginally and rectally. Asking if it is okay to continue using baby wipes and Desitin cream. Advised okay to continue as needed. Patient is agreeable.  Routing to Kem Boroughs, FNP as [name] is out of the office this week. Will close encounter.

## 2016-07-10 NOTE — Telephone Encounter (Signed)
Patient is returning a call to Kaitlyn. °

## 2016-07-10 NOTE — Telephone Encounter (Signed)
Patient called and requested to speak with the nurse for advice about the Desitin she is using.

## 2016-07-11 ENCOUNTER — Telehealth: Payer: Self-pay | Admitting: Certified Nurse Midwife

## 2016-07-11 NOTE — Telephone Encounter (Signed)
Patient used baby wipes and is itching.

## 2016-07-11 NOTE — Telephone Encounter (Signed)
Patient returning call.

## 2016-07-11 NOTE — Telephone Encounter (Signed)
Spoke with patient. Patient state that she was having rectal itching this morning. Had a bowel movement and used baby wipes for cleansing and itching resolved. Denies any current itching or symptoms. Aware she will need to keep the area clean and dry. Will call with any ongoing symptoms to schedule an appointment.  Routing to provider for final review. Patient agreeable to disposition. Will close encounter.

## 2016-07-11 NOTE — Telephone Encounter (Signed)
Attempted to reach patient at number provided [phone], phone rang and rang with no answer. No voicemail box.

## 2016-07-22 ENCOUNTER — Telehealth: Payer: Self-pay | Admitting: Certified Nurse Midwife

## 2016-07-22 NOTE — Telephone Encounter (Signed)
Patient is asking to talk with Kaitlyn.  °

## 2016-07-22 NOTE — Telephone Encounter (Signed)
Spoke with patient. Patient states that she has been experiencing intermittent rectal itching. Notices itching most frequently after having a bowel movement. Uses unscented baby wipes and destin for relief. Reports the area is sore. Advised she will need to keep the area clean, dry, and try not to itch as it will make the area tender. Patient reports having normal bowel movements. Advised she will need to monitor her symptoms and be seen for evaluation if symptoms persist or worsen. Patient verbalizes understanding.  Routing to provider for final review. Patient agreeable to disposition. Will close encounter.

## 2016-07-30 ENCOUNTER — Telehealth: Payer: Self-pay | Admitting: Certified Nurse Midwife

## 2016-07-30 ENCOUNTER — Ambulatory Visit (INDEPENDENT_AMBULATORY_CARE_PROVIDER_SITE_OTHER): Payer: Medicare Other | Admitting: Podiatry

## 2016-07-30 ENCOUNTER — Encounter: Payer: Self-pay | Admitting: Podiatry

## 2016-07-30 VITALS — BP 154/102 | HR 87

## 2016-07-30 DIAGNOSIS — E1140 Type 2 diabetes mellitus with diabetic neuropathy, unspecified: Secondary | ICD-10-CM

## 2016-07-30 DIAGNOSIS — B351 Tinea unguium: Secondary | ICD-10-CM | POA: Diagnosis not present

## 2016-07-30 DIAGNOSIS — M79676 Pain in unspecified toe(s): Secondary | ICD-10-CM

## 2016-07-30 NOTE — Telephone Encounter (Signed)
Patient called back is asking for the [facility] not call her before 4:00pm or after 4:30pm.

## 2016-07-30 NOTE — Telephone Encounter (Signed)
Patient is having some rectum itching. Says she is using the medication but still want nurse to call.

## 2016-07-30 NOTE — Progress Notes (Signed)
Patient ID: Linda Thompson, female   DOB: 01/09/1956, 61 y.o.   MRN: [id number] Complaint:  Visit Type: Patient returns to my office for continued preventative foot care services. Complaint: Patient states" my nails have grown long and thick and become painful to walk and wear shoes" Patient has been diagnosed with DM with no foot complications. The patient presents for preventative foot care services. No changes to ROS  Podiatric Exam: Vascular: dorsalis pedis and posterior tibial pulses are palpable bilateral. Capillary return is immediate. Temperature gradient is WNL. Skin turgor WNL  Sensorium: Normal Semmes Weinstein monofilament test. Normal tactile sensation bilaterally. Nail Exam: Pt has thick disfigured discolored nails with subungual debris noted bilateral entire nail hallux through fifth toenails Ulcer Exam: There is no evidence of ulcer or pre-ulcerative changes or infection. Orthopedic Exam: Muscle tone and strength are WNL. No limitations in general ROM. No crepitus or effusions noted. Foot type and digits show no abnormalities. Bony prominences are unremarkable. Skin: No Porokeratosis. No infection or ulcers  Diagnosis:  Onychomycosis, , Pain in right toe, pain in left toes  Treatment & Plan Procedures and Treatment: Consent by patient was obtained for treatment procedures. The patient understood the discussion of treatment and procedures well. All questions were answered thoroughly reviewed. Debridement of mycotic and hypertrophic toenails, 1 through 5 bilateral and clearing of subungual debris. No ulceration, no infection noted.  Return Visit-Office Procedure: Patient instructed to return to the office for a follow up visit 3 months for continued evaluation and treatment.   Gardiner Barefoot DPM

## 2016-07-31 ENCOUNTER — Encounter: Payer: Self-pay | Admitting: Certified Nurse Midwife

## 2016-07-31 ENCOUNTER — Ambulatory Visit (INDEPENDENT_AMBULATORY_CARE_PROVIDER_SITE_OTHER): Payer: Medicare Other | Admitting: Certified Nurse Midwife

## 2016-07-31 VITALS — BP 120/78 | HR 70 | Resp 16 | Ht 62.0 in | Wt 191.0 lb

## 2016-07-31 DIAGNOSIS — K6289 Other specified diseases of anus and rectum: Secondary | ICD-10-CM | POA: Diagnosis not present

## 2016-07-31 NOTE — Patient Instructions (Addendum)
You have just rectal irritation from sweating. Obtain Balmex diaper rash protective cream in baby section at drug store. Apply very small amount around rectal opening once daily for protection , try not to scratch area which will make it worse. Clean area with dove soap and warm water wash cloth, no scrubbing. You are doing well taking care of yourself.

## 2016-07-31 NOTE — Telephone Encounter (Signed)
Spoke with patient. Patient states that she has been using Desitin cream as needed for rectal itching. Feels symptoms are worsening and not being relieved with Desitin. Patient is requesting an office visit for evaluation. Appointment scheduled for today 07/31/2016 at 10 am with Linda Thompson CNM. Patient is agreeable to date and time.  Routing to provider for final review. Patient agreeable to disposition. Will close encounter.

## 2016-07-31 NOTE — Progress Notes (Signed)
61 y.o. Single Caucasian female [name] here with complaint of rectal symptoms of itching, that has increased over the past week. Has used Destin cream for protection and occasional itching with good results in the past. Feels this is not working now. Has occasional stool leakage, but cleans well using baby wipes unscented.  Denies new personal products or vaginal symptoms of itching or dryness. Now has CNA who helps her twice weekly to help with cleaning and bathing if needed, and changing sheets. She has new sheets now after many years. Now wearing house dresses to help with yeast dermatitis prevention working well.  Urinary symptoms none . Menopausal with previous dryness issues,but none now. Denies constipation or blood in stool or on toilet paper. Recent Podiatry visit for foot care, all normal. Has appointment with PCP in 3 months. Patient's lawyer is retiring, but patient has talked with new lawyer who will take care of her affairs. Patient satisfied with choice, has same Network engineer, who speaks to her. No other health issues today.  ROS Pertinent to HPI  O:Healthy female WDWN Affect: normal, orientation x 3, mentally challenged  Exam:Skin:warm and dry, no scabs or picking noted Abdomen:soft, non tender  Inguinal Lymph nodes: no enlargement or tenderness, no redness in groin area or scaling Pelvic exam: External genital: normal female, no scaling or increase redness or exudate, vulva on right no change in known mass size of sarcoidosis primary site BUS: negative Vaginal exam not done Rectal area: very slight redness noted at anal entrance only, with obvious scratching to area, non tender, wet prep taken, No hemorrhoids or anal fissure noted  Wet Prep results:KOH,Saline - negative for pathogens   A:Normal limited pelvic exam No evidence of chronic yeast dermatitis Rectal irritation only at anal entrance, probable caused by perspiration   P:Discussed findings of normal exam and vulva and  groin area looks very normal and no issues there today. Discussed rectal finding. Instructed to stop Destin use now. Clean area with warm water and wash clothes, avoid scratching if possible. Dry area well after bowel movement avoid rubbing  Obtain Balmex OTC for use only in area around rectal opening. Shown area to patient and written instructions given and can purchase OTC at her usual drug store. Questions addressed.  Rv prn

## 2016-08-05 ENCOUNTER — Telehealth: Payer: Self-pay | Admitting: Certified Nurse Midwife

## 2016-08-05 NOTE — Telephone Encounter (Signed)
Patient wants to speak with Kaitlyn. °

## 2016-08-05 NOTE — Telephone Encounter (Signed)
Agree with plan 

## 2016-08-05 NOTE — Telephone Encounter (Signed)
Patient is calling to speak with [facility]. She thinks she is having another break out.

## 2016-08-05 NOTE — Telephone Encounter (Signed)
Spoke with patient. Patient reports redness between legs at groin and  inner thigh, mostly on left side. Feels like she wants to itch and notices some redness, no broken skin. Reports wearing depends when going out and removing depends when home and wearing gowns. Feels like she is sweating and irritation is at edge of depends. Reports balmex helping for rectal irritation.   Recommended not to wear depends longer than needed, remove when at home. Keep area clean and dry, try not to scratch, wear loose fitting clothing such as gowns.  Patient repeated instructions back and states this is what she has been doing, still can't help the sweating.   Advised patient would review with Wonda Amis and return call with any additional recommendations. Patient is agreeable.  Melvia Heaps, CNM -any additional recommendations?

## 2016-08-06 NOTE — Telephone Encounter (Signed)
Left message to call Jannetta Massey at [phone].  

## 2016-08-06 NOTE — Telephone Encounter (Signed)
Spoke with patient, advised no additional recommendations. Reviewed recommendations as previously discussed. Patient states area is worse and broke out, feels she needs OV. States sweating more and area will get worse. Patient scheduled for OV on 08/07/16 at 10am with Melvia Heaps, CNM. Patient verbalizes understanding.  Routing to provider for final review. Patient is agreeable to disposition. Will close encounter.

## 2016-08-07 ENCOUNTER — Encounter: Payer: Self-pay | Admitting: Certified Nurse Midwife

## 2016-08-07 ENCOUNTER — Ambulatory Visit (INDEPENDENT_AMBULATORY_CARE_PROVIDER_SITE_OTHER): Payer: Medicare Other | Admitting: Certified Nurse Midwife

## 2016-08-07 VITALS — BP 112/70 | HR 70 | Resp 16 | Ht 62.0 in | Wt 189.0 lb

## 2016-08-07 DIAGNOSIS — B372 Candidiasis of skin and nail: Secondary | ICD-10-CM | POA: Diagnosis not present

## 2016-08-07 MED ORDER — FLUCONAZOLE 150 MG PO TABS: 150.0000 mg | ORAL_TABLET | ORAL | 0 refills | Status: DC

## 2016-08-07 NOTE — Progress Notes (Signed)
61 y.o. Single Caucasian female [name] here with complaint of groin symptoms of itching, and increase pink. Has noted increase in past few days. Showers daily and blow drys area to prevent yeast. Has been wearing loose underwear and house dresses to be able to go without underwear to prevent problems. Rectal itching seen on 07/31/16 controlled with Balmex use prn now. . Denies new personal products, other than new towels now  Urinary symptoms none . Continues to have help 2 x week with CNA and RN once every 2 weeks to evaluate status. Sees PCP every 3 months for diabetic follow up. No other health issues today.  ROS Pertinent to above.  O:Healthy female WDWN Affect: normal, orientation x 3  Exam: Skin: warm and dry, no obvious injuries or skin picking or scratching Abdomen:soft, non tender, no masses  Inguinal Lymph node: no enlargement or tenderness Pelvic exam: External genital: normal female with increase in pink bilateral in groin area with exudate and yeast type odor, extends to sides and top of vulva bilateral only. No lesions slightly tender, weeping noted. Right vulva known enlargement from Sarcoid sight, but no change and non tender. Wet prep taken BUS: negative Vagina: normal appearance , Cervix: absent Uterus: absent Adnexa:absent      Wet Prep results:KOH,Saline + for yeast only   A:Normal pelvic exam Chronic yeast dermatitis history with flare again Type 2 diabetic good control per labs on epic No change in hygiene habits per conversation with patient History of sarcoid site on right vulva, no change   P:Discussed findings of yeast dermatitis of groin area again and etiology. Discussed avoiding  moist clothes or pads for extended period of time. Continue house dress use with underwear if possible to avoid increase wetness. Discussed placing dry washcloth under abdominal flap to absorb moisture if needed. Continue to clean as she has been with using blow dryer for drying.  Discussed using Diflucan again due to this works best for her. Patient agreeable and will use one tablet each week for 4 weeks. Continue all other medications and recommendations. No changes Rx Diflucan see order with instructions (only ordered 2,she had 2 with her)   Rv prn

## 2016-08-07 NOTE — Patient Instructions (Signed)
Stay in house dresses as much as possible Use dry wash cloth under tummy fold for 2 hours to absorb moisture and air dry as much as possible Take one pill DIflucan once weekly for 4 weeks( has 2 from before) prescription to pharmacy for other 2. Should be a total of 4 weeks treatment.

## 2016-08-09 ENCOUNTER — Other Ambulatory Visit: Payer: Self-pay | Admitting: Family Medicine

## 2016-08-15 NOTE — Progress Notes (Signed)
HPI:  Follow up: AWV 11/2015 Due for microalb urine Reports is doing well. No complaints today. No falls recently. Reports she has home aide that helps her with meals, appointment and medicines a few times per week. No CP, SOb, DOE, falls.  Diabetes: -meds: metformin -allergy to acei  Obesity, hyperlipidemia, hypertension: -meds: diltiazem, hctz, pravastatin  Moderate intellectual disabilities/Anxiety: -seeing psychiatrist -high level of anxiety about health with frequent visits  -HCPOA: [name]  Allergic rhinitis: -uses flonase and claritin as needed  GERD/irritable bowel syndrome: -meds: low dose PPI  Sarcoidosis: -sees pulmonology -Bx 10/03/14 [place]  - ACE level reported to be 98 ? 01/2015  - cxr 04/24/2015 wnl   Osteoarthritis, multiple sites: -sees Dr. Lorin Mercy -uses tylenol prn  Meningioma: -Repeat MR from 2017 - radiology feels not a meningioma, but rather "Thickening of the anterior falx appears to be related to degenerative ossification and not meningioma." -HCPOA opted not to do repeat imaging with CT for the possible hemangioma and declined further evaluation of this.  ROS: See pertinent positives and negatives per HPI.  Past Medical History:  Diagnosis Date  . Allergic rhinitis   . Allergy   . Anemia   . Chronic low back pain   . DDD (degenerative disc disease), lumbar   . GERD (gastroesophageal reflux disease)    w/ hx diaphragmatic hernia  . Glaucoma   . GLAUCOMA NOS 03/11/2006   Qualifier: Diagnosis of  By: Diona Browner MD, Amy    . Heart murmur   . Hx of fracture    R arm, foot  . Hyperlipemia   . Hypertension   . IBS (irritable bowel syndrome)   . Low back pain 11/22/2012  . Meningioma ([place])    ant falx, stable on prior imaging  . Moderate intellectual disabilities   . Murmur   . Obesity   . Osteoarthrosis, unspecified whether generalized or localized, ankle and foot   . Sarcoidosis    difuse bony lesions and LDA, dx by biopsy in 2016  .  Transaminitis   . Type II or unspecified type diabetes mellitus without mention of complication, not stated as uncontrolled     Past Surgical History:  Procedure Laterality Date  . CATARACT EXTRACTION    . COLONOSCOPY  04/15/2011  . ELBOW SURGERY     right elbow  . LIPOMA EXCISION Left [id number]   posterior left axilla  . TOTAL ABDOMINAL HYSTERECTOMY  03/05/1992   leiomyoma and cellular leiomyoma    Family History  Problem Relation Age of Onset  . Diabetes Mother   . Stroke Mother   . Hypertension Mother   . Stroke Father   . Diabetes Maternal Aunt   . Heart disease Maternal Aunt         (had pacemaker)  . Diabetes Maternal Grandmother   . Stroke Maternal Grandmother   . Hypertension Maternal Grandmother   . Colon cancer Neg Hx     Social History   Social History  . Marital status: Single    Spouse name: N/A  . Number of children: 0  . Years of education: N/A   Occupational History  . Disabled (from arm fracture)    Social History Main Topics  . Smoking status: Never Smoker  . Smokeless tobacco: Never Used  . Alcohol use No  . Drug use: No  . Sexual activity: No     Comment: Hyst--TAH--unsure if has ovaries   Other Topics Concern  . None   Social History Narrative  Lives at [facility] (on [address] in [place])      [place] for arm fracture      Mental Retardation, but is able to drive to places she is comfortable with and prepare her own meals and manage most of her own affairs.      [name] (neighbor) is POA/HCPOA      Reports she gets regular exercise and tries to eat healthy     Current Outpatient Prescriptions:  .  acetaminophen (TYLENOL) 500 MG tablet, Take 500 mg by mouth as needed (left ear pain). Reported on 04/18/2015, Disp: , Rfl:  .  diltiazem (CARDIZEM CD) 240 MG 24 hr capsule, TAKE ONE CAPSULE BY MOUTH EVERY DAY, Disp: 90 capsule, Rfl: 2 .  ferrous sulfate 325 (65 FE) MG tablet, TAKE 1  TABLET (325 MG TOTAL) BY MOUTH 2 (TWO) TIMES DAILY WITH A MEAL., Disp: 60 tablet, Rfl: 2 .  fluconazole (DIFLUCAN) 150 MG tablet, Take 1 tablet (150 mg total) by mouth once a week., Disp: 2 tablet, Rfl: 0 .  fluticasone (FLONASE) 50 MCG/ACT nasal spray, Place into both nostrils daily., Disp: , Rfl:  .  hydrochlorothiazide (MICROZIDE) 12.5 MG capsule, TAKE 1 CAPSULE BY MOUTH ONCE DAILY, Disp: 90 capsule, Rfl: 1 .  hydrocortisone (ANUSOL-HC) 25 MG suppository, UNWRAP AND INSERT 1 SUPPOSITORY IN THE RECTUM TWICE A DAY, Disp: , Rfl: 1 .  hydrocortisone 2.5 % ointment, APPLY TO AFFECTED AREA TWICE A DAY, Disp: , Rfl: 0 .  ipratropium (ATROVENT) 0.06 % nasal spray, PLACE 2 SPRAYS INTO BOTH NOSTRILS 4 (FOUR) TIMES DAILY., Disp: 45 mL, Rfl: 2 .  ketoconazole (NIZORAL) 2 % shampoo, APPLY TO AFFECTED AREA TWICE A WEEK AS DIRECTED, Disp: 120 mL, Rfl: 5 .  Lactobacillus (FLORAJEN ACIDOPHILUS PO), Take 1 tablet by mouth daily., Disp: , Rfl:  .  latanoprost (XALATAN) 0.005 % ophthalmic solution, Place 1 drop into both eyes at bedtime. , Disp: , Rfl:  .  liver oil-zinc oxide (DESITIN) 40 % ointment, Apply 1 application topically as needed for irritation., Disp: , Rfl:  .  Loperamide HCl 1 MG/7.5ML SUSP, Take 1 mg by mouth., Disp: , Rfl:  .  loratadine (CLARITIN) 10 MG tablet, Take 10 mg by mouth daily., Disp: , Rfl:  .  metFORMIN (GLUCOPHAGE) 500 MG tablet, TAKE 1 TABLET BY MOUTH TWICE A DAY WITH A MEAL, Disp: 180 tablet, Rfl: 1 .  omeprazole (PRILOSEC) 20 MG capsule, TAKE 1 CAPSULE (20 MG TOTAL) BY MOUTH 2 (TWO) TIMES DAILY., Disp: 180 capsule, Rfl: 1 .  potassium chloride SA (KLOR-CON M20) 20 MEQ tablet, Take 1 tablet (20 mEq total) by mouth daily., Disp: 90 tablet, Rfl: 3 .  pravastatin (PRAVACHOL) 40 MG tablet, TAKE 1 TABLET BY MOUTH EVERY EVENING, Disp: 90 tablet, Rfl: 1 .  Propylene Glycol (SYSTANE BALANCE) 0.6 % SOLN, , Disp: , Rfl:  .  traMADol (ULTRAM) 50 MG tablet, Take 50 mg by mouth daily., Disp: ,  Rfl: 0 .  UNKNOWN TO PATIENT, , Disp: , Rfl:   EXAM:  Vitals:   08/18/16 0834 08/18/16 0839  BP: 122/82 122/82  Pulse: 91   Temp: (!) 97.5 F (36.4 C)     Body mass index is 35.01 kg/m.  GENERAL: vitals reviewed and listed above, alert, oriented, appears well hydrated and in no acute distress  HEENT: atraumatic, conjunttiva clear, no obvious abnormalities on inspection of external nose and ears  NECK: no obvious masses on inspection  LUNGS:  clear to auscultation bilaterally, no wheezes, rales or rhonchi, good air movement  CV: HRRR, no peripheral edema  MS: moves all extremities without noticeable abnormality  PSYCH: pleasant and cooperative, no obvious depression or anxiety  ASSESSMENT AND PLAN:  Discussed the following assessment and plan:  Type 2 diabetes mellitus without complication, without long-term current use of insulin ([place]) - Plan: Microalbumin / creatinine urine ratio, Basic metabolic panel  Hypertension associated with diabetes ([place]) - Plan: CBC  Hyperlipidemia associated with type 2 diabetes mellitus ([place])  Obesity, morbid ([place])  -labs per orders, lifestyle recs -healthy diet and exercise advised -fall precautions discussed -due for AWV and follow up in November -Patient advised to return or notify a doctor immediately if symptoms worsen or persist or new concerns arise.  Patient Instructions  BEFORE YOU LEAVE: -follow up: AWV with Manuela Schwartz and Follow up with Dr. Maudie Mercury on the SAME day in November -labs  We have ordered labs or studies at this visit. It can take up to 1-2 weeks for results and processing. IF results require follow up or explanation, we will call you with instructions. Clinically stable results will be released to your [facility]. If you have not heard from [place] or cannot find your results in [facility] in 2 weeks please contact our office at [phone].  If you are not yet signed up for [facility], please consider signing up.   We recommend the  following healthy lifestyle for LIFE: 1) Small portions. Small regular meals.  Tip: eat off of a salad plate instead of a dinner plate.  2) Eat a healthy clean diet.   TRY TO EAT: -at least 5-7 servings of low sugar vegetables per day (not corn, potatoes or bananas.) -berries are the best choice if you wish to eat fruit.   -lean meets (fish, chicken or [place] breasts) -vegan proteins for some meals - beans or tofu, whole grains, nuts and seeds -Replace bad fats with good fats - good fats include: fish, nuts and seeds, canola oil, olive oil -small amounts of low fat or non fat dairy -small amounts of100 % whole grains - check the lables  AVOID: -SUGAR, sweets, anything with added sugar, corn syrup or sweeteners -if you must have a sweetener, small amounts of stevia may be best -sweetened beverages -simple starches (rice, bread, potatoes, pasta, chips, etc - small amounts of 100% whole grains are ok) -red meat, pork, butter -fried foods, fast food, processed food, excessive dairy, eggs and coconut.  3)Get at least 150 minutes of sweaty aerobic exercise per week.  4)Reduce stress - consider counseling, meditation and relaxation to balance other aspects of your life.          [name] R., DO

## 2016-08-18 ENCOUNTER — Encounter: Payer: Self-pay | Admitting: Family Medicine

## 2016-08-18 ENCOUNTER — Other Ambulatory Visit: Payer: Self-pay | Admitting: Gastroenterology

## 2016-08-18 ENCOUNTER — Ambulatory Visit (INDEPENDENT_AMBULATORY_CARE_PROVIDER_SITE_OTHER): Payer: Medicare Other | Admitting: Family Medicine

## 2016-08-18 ENCOUNTER — Telehealth: Payer: Self-pay | Admitting: Certified Nurse Midwife

## 2016-08-18 ENCOUNTER — Other Ambulatory Visit: Payer: Self-pay

## 2016-08-18 VITALS — BP 122/82 | HR 91 | Temp 97.5°F | Ht 62.0 in | Wt 191.4 lb

## 2016-08-18 DIAGNOSIS — E785 Hyperlipidemia, unspecified: Secondary | ICD-10-CM

## 2016-08-18 DIAGNOSIS — I10 Essential (primary) hypertension: Secondary | ICD-10-CM

## 2016-08-18 DIAGNOSIS — E1169 Type 2 diabetes mellitus with other specified complication: Secondary | ICD-10-CM | POA: Diagnosis not present

## 2016-08-18 DIAGNOSIS — C419 Malignant neoplasm of bone and articular cartilage, unspecified: Secondary | ICD-10-CM | POA: Insufficient documentation

## 2016-08-18 DIAGNOSIS — E1159 Type 2 diabetes mellitus with other circulatory complications: Secondary | ICD-10-CM

## 2016-08-18 DIAGNOSIS — E119 Type 2 diabetes mellitus without complications: Secondary | ICD-10-CM | POA: Diagnosis not present

## 2016-08-18 DIAGNOSIS — I152 Hypertension secondary to endocrine disorders: Secondary | ICD-10-CM

## 2016-08-18 LAB — BASIC METABOLIC PANEL
BUN: 13 mg/dL (ref 6–23)
CALCIUM: 9.1 mg/dL (ref 8.4–10.5)
CO2: 26 mEq/L (ref 19–32)
Chloride: 107 mEq/L (ref 96–112)
Creatinine, Ser: 0.48 mg/dL (ref 0.40–1.20)
GFR: 139.27 mL/min (ref >60.00)
GLUCOSE: 98 mg/dL (ref 70–99)
Potassium: 4.1 mEq/L (ref 3.5–5.1)
Sodium: 142 mEq/L (ref 135–145)

## 2016-08-18 LAB — CBC
HCT: 47.5 % — ABNORMAL HIGH (ref 36.0–46.0)
Hemoglobin: 15.5 g/dL — ABNORMAL HIGH (ref 12.0–15.0)
MCHC: 32.6 g/dL (ref 30.0–36.0)
MCV: 90.5 fl (ref 78.0–100.0)
Platelets: 331 10*3/uL (ref 150.0–400.0)
RBC: 5.24 Mil/uL — ABNORMAL HIGH (ref 3.87–5.11)
RDW: 14.2 % (ref 11.5–15.5)
WBC: 6.9 10*3/uL (ref 4.0–10.5)

## 2016-08-18 LAB — MICROALBUMIN / CREATININE URINE RATIO
CREATININE, U: 109.7 mg/dL
MICROALB UR: 4.3 mg/dL — AB (ref 0.0–1.9)
Microalb Creat Ratio: 3.9 mg/g (ref 0.0–30.0)

## 2016-08-18 MED ORDER — FERROUS SULFATE 325 (65 FE) MG PO TABS: ORAL_TABLET | ORAL | 1 refills | Status: DC

## 2016-08-18 NOTE — Telephone Encounter (Signed)
Spoke with patient. States is noticing a rectal itch after bowel movements, can I use Balmex more than every other day? Uses unscented baby wipes to clean rectum after BMs, cleans and dries well, still notices an itch. Taking "small pink pill" once a week.   Advised to clean and dry rectal area well as previously instructed, may apply small amount of Balmex externally to rectum as needed for irritation. Patient read back instructions. Aware to return call to office if symptoms do not resolve or for additional questions.  Routing to provider for final review. Patient is agreeable to disposition. Will close encounter.   Cc: Melvia Heaps, CNM

## 2016-08-18 NOTE — Patient Instructions (Signed)
BEFORE YOU LEAVE: -follow up: AWV with Manuela Schwartz and Follow up with Dr. Maudie Mercury on the SAME day in November -labs  We have ordered labs or studies at this visit. It can take up to 1-2 weeks for results and processing. IF results require follow up or explanation, we will call you with instructions. Clinically stable results will be released to your [facility]. If you have not heard from [place] or cannot find your results in [facility] in 2 weeks please contact our office at [phone].  If you are not yet signed up for [facility], please consider signing up.   We recommend the following healthy lifestyle for LIFE: 1) Small portions. Small regular meals.  Tip: eat off of a salad plate instead of a dinner plate.  2) Eat a healthy clean diet.   TRY TO EAT: -at least 5-7 servings of low sugar vegetables per day (not corn, potatoes or bananas.) -berries are the best choice if you wish to eat fruit.   -lean meets (fish, chicken or [place] breasts) -vegan proteins for some meals - beans or tofu, whole grains, nuts and seeds -Replace bad fats with good fats - good fats include: fish, nuts and seeds, canola oil, olive oil -small amounts of low fat or non fat dairy -small amounts of100 % whole grains - check the lables  AVOID: -SUGAR, sweets, anything with added sugar, corn syrup or sweeteners -if you must have a sweetener, small amounts of stevia may be best -sweetened beverages -simple starches (rice, bread, potatoes, pasta, chips, etc - small amounts of 100% whole grains are ok) -red meat, pork, butter -fried foods, fast food, processed food, excessive dairy, eggs and coconut.  3)Get at least 150 minutes of sweaty aerobic exercise per week.  4)Reduce stress - consider counseling, meditation and relaxation to balance other aspects of your life.

## 2016-08-18 NOTE — Telephone Encounter (Signed)
Patient called requesting to speak with questions about her "Balmex" and "anal itching."

## 2016-08-19 ENCOUNTER — Telehealth: Payer: Self-pay | Admitting: Certified Nurse Midwife

## 2016-08-19 NOTE — Telephone Encounter (Signed)
Patient is using coconut oil and still itching.

## 2016-08-19 NOTE — Telephone Encounter (Signed)
Spoke with patient. Patient requesting OV with Melvia Heaps, CNM for itching. Reports internal vaginal itching, applying coconut oil with no relief. Unable to tell if any redness or irritation. Request rectal itching has not resolved, has not used Balmex -see telephone encounter dated 7/31. Patient scheduled for OV on 08/20/16 at 2:45pm with Melvia Heaps, CNM. Patient is agreeable to date and time.  Routing to provider for final review. Patient is agreeable to disposition. Will close encounter.

## 2016-08-20 ENCOUNTER — Ambulatory Visit: Payer: Self-pay | Admitting: Certified Nurse Midwife

## 2016-08-20 ENCOUNTER — Telehealth: Payer: Self-pay | Admitting: Certified Nurse Midwife

## 2016-08-20 NOTE — Telephone Encounter (Signed)
Patient is calling about her appointment for tomorrow. She wants to speak with the nurse.

## 2016-08-20 NOTE — Telephone Encounter (Signed)
Patient states she had appointment tomorrow at 10:00am and she has had 2 soft stools late today. She doesn't feel she is sick but just wanted to make [place] aware if she continues with soft/loose stools throughout the night she may have to cancel appointment. I advised patient if this happens to please cancel appointment and there would not be a "late" cancellation fee.

## 2016-08-21 ENCOUNTER — Ambulatory Visit (INDEPENDENT_AMBULATORY_CARE_PROVIDER_SITE_OTHER): Payer: Medicare Other | Admitting: Certified Nurse Midwife

## 2016-08-21 ENCOUNTER — Encounter: Payer: Self-pay | Admitting: Certified Nurse Midwife

## 2016-08-21 VITALS — BP 112/70 | HR 70 | Resp 16 | Ht 62.0 in | Wt 190.0 lb

## 2016-08-21 DIAGNOSIS — B373 Candidiasis of vulva and vagina: Secondary | ICD-10-CM

## 2016-08-21 DIAGNOSIS — B3731 Acute candidiasis of vulva and vagina: Secondary | ICD-10-CM

## 2016-08-21 DIAGNOSIS — B372 Candidiasis of skin and nail: Secondary | ICD-10-CM | POA: Diagnosis not present

## 2016-08-21 MED ORDER — FLUCONAZOLE 150 MG PO TABS: 150.0000 mg | ORAL_TABLET | ORAL | 0 refills | Status: DC

## 2016-08-21 NOTE — Progress Notes (Signed)
61 y.o. Single Caucasian female [name] here with complaint of vaginal symptoms of itching,around entrance to vagina and around rectum, feels creases in groin area of better.Onset of symptoms 2-3 days ago. Denies new personal products. Using Balmex on rectal area with good results. Has noted vaginal area itching and had not before in a long time. Patient tried coconut oil and this gave no relief. Recent visit with PCP and per patient diabetes OK and labs OK. No changes. Continues to have help at home 2x per week, but will not allow them in her medications. Denies vaginal or rectal bleeding. No other concerns today. She is wearing dresses at home when possible.  Urinary symptoms none, no leaking, no bowel incontinence .   ROS Pertinent to HPI  O:Healthy female WDWN Affect: normal, orientation x 3 Intellectually challenged   Exam:Skin warm and dry, no scabbing or picking noted Abdomen: soft, non tender  Inguinal Lymph nodes: no enlargement or tenderness Groin area: left area some redness, but resolving, right area weepy scattered red appearance, no break in skin noted Pelvic exam: External genital: normal female with slight redness at vaginal introitus, wet prep taken, right vulva mass unchanged BUS: negative Vagina: scant  discharge noted. Ph:4.0   ,Wet prep taken,  Cervix: absent Uterus: absent Adnexa: no masses or fullness noted, absent adnexa   Wet Prep results:KOH,Saline positive for yeast only   A:Normal pelvic exam  Chronic Yeast vaginitis  ChronicYeast dermatitis improving continues on Diflucan once weekly for 6 weeks Type 2 Diabetes with PCP management Sarcoidosis with vulva primary site   P:Discussed findings of normal exam and yeast noted in vagina. Discussed she is already on Diflucan and this should resolve this. Discussed weepy area in right groin area and instructed to use small amount of Balmex on area to protect skin once daily.Patient voiced understanding and repeat  instructions back to me. Questions addressed.  Rx Diflucan see order Rv prn

## 2016-08-21 NOTE — Patient Instructions (Signed)
Apply thin coat of Balmex on inside of right groin area once a day for 3 days. If better use 4  More days and stop. If not better stop using at 3 days. Ok to use at rectal area if itching only.

## 2016-08-22 ENCOUNTER — Telehealth: Payer: Self-pay | Admitting: Certified Nurse Midwife

## 2016-08-22 NOTE — Telephone Encounter (Signed)
Spoke with patient. Patient states she did not know there was another Rx for "pink pill" at pharmacy, told them to put it back. Advised patient she would take 1 "pink pill" (diflucan) once a week to complete 6 weeks. Patient states she will call pharmacy back to let them know she will pick up the RX tomorrow. Patient verbalizes understanding.  Routing to provider for final review. Patient is agreeable to disposition. Will close encounter.

## 2016-08-22 NOTE — Telephone Encounter (Signed)
Patient says she has 1 more "pink pill" and want to know if Mrs.Debbi is going to send another prescription over to her pharmacy.

## 2016-08-23 ENCOUNTER — Other Ambulatory Visit: Payer: Self-pay | Admitting: Family Medicine

## 2016-08-25 NOTE — Telephone Encounter (Signed)
Returned patient's call. She states she picked up Balmex cream at pharmacy. She wants to make sure it is okay to use if she needs it. Advised patient to only use as needed for itching and to protect skin. She states she is taking the Diflucan once a week as instructed. Routed to D.Hollice Espy, CNM

## 2016-08-25 NOTE — Telephone Encounter (Signed)
Patient is asking to speak with a nurse.

## 2016-08-26 NOTE — Telephone Encounter (Signed)
She was given typed instructions to follow  Too.

## 2016-08-28 NOTE — Telephone Encounter (Signed)
Routed message to triage

## 2016-08-28 NOTE — Telephone Encounter (Signed)
Spoke with patient. Patient states that when she was seen on 08/21/16 she was advised to use Balmex on the right side of her groin. Used for 3 days and symptoms got better on right side. Now states she has redness and irritation on her left groin area. "I think it is from sweating." Advised may use Balmex on left groin area once a day for 3 days. If symptoms worsen or persist will need OV. Patient is agreeable and verbalizes understanding.   Routing to covering provider for final review. Patient agreeable to disposition. Will close encounter.

## 2016-08-28 NOTE — Telephone Encounter (Signed)
Patient complains of rash under her stomach and wants to know what to do.

## 2016-08-29 ENCOUNTER — Telehealth: Payer: Self-pay | Admitting: Certified Nurse Midwife

## 2016-08-29 NOTE — Telephone Encounter (Signed)
Spoke with patient. Patient states that she is using coconut oil vaginally nightly after showering. Reports she is occasionally having vaginal itching. Reports she will have one short episode of itching randomly. Denies constant itching, irritation, or discharge. Asking if occasional vaginal itching is normal. Advised it is not uncommon to have occasional itching and that as long as she is keeping the area clean, dry, and is not having any persist symptoms it is okay to monitor as this is normal. Patient verbalizes understanding.  Routing to covering provider for final review. Patient agreeable to disposition. Will close encounter.

## 2016-08-29 NOTE — Telephone Encounter (Signed)
Patient wants to know if she should still be having itching since she is using the coconut oil.

## 2016-09-03 ENCOUNTER — Ambulatory Visit (INDEPENDENT_AMBULATORY_CARE_PROVIDER_SITE_OTHER): Payer: Medicare Other | Admitting: Gastroenterology

## 2016-09-03 ENCOUNTER — Encounter: Payer: Self-pay | Admitting: Gastroenterology

## 2016-09-03 VITALS — BP 120/70 | HR 84 | Ht 62.0 in | Wt 191.0 lb

## 2016-09-03 DIAGNOSIS — D509 Iron deficiency anemia, unspecified: Secondary | ICD-10-CM

## 2016-09-03 DIAGNOSIS — K317 Polyp of stomach and duodenum: Secondary | ICD-10-CM

## 2016-09-03 MED ORDER — FERROUS SULFATE 325 (65 FE) MG PO TABS: 325.0000 mg | ORAL_TABLET | Freq: Every day | ORAL | 3 refills | Status: DC

## 2016-09-03 NOTE — Patient Instructions (Addendum)
If you are age 62 or older, your body mass index should be between 23-30. Your Body mass index is 34.93 kg/m. If this is out of the aforementioned range listed, please consider follow up with your Primary Care Provider.  If you are age 88 or younger, your body mass index should be between 19-25. Your Body mass index is 34.93 kg/m. If this is out of the aformentioned range listed, please consider follow up with your Primary Care Provider.   We have sent the following medications to your pharmacy for you to pick up at your convenience:  Iron Supplement  Please follow up with [facility] for your Upper Endoscopy procedure. This procedure is scheduled with Dr. [name] 11/14/16 at 10:30am. His office telephone number is [phone].  You also have a pre admit visit for this procedure 10/28/16. [facility] should be sending you paperwork in regards to these appointments.  Thank you.

## 2016-09-03 NOTE — Progress Notes (Signed)
HPI :  61 year old female with history as outlined below, here for a follow-up visit.  She was last seen earlier last year for new onset iron deficiency anemia for which she underwent an EGD and colonoscopy as outlined below. Her EGD was remarkable for multiple large hyperplastic polyps which are ulcerated and the cause of her anemia. Given her size [facility] was [facility] gastric polyps. Most of these were benign hyperplastic polyps however the largest contained high-grade dysplasia. Dr. Arsenio Loader has been following her at [facility] for this issue. She had her initial EGD with him in July 2017 and a follow-up EGD in October 2017, with EMR of another large hyperplastic polyp which contained focal high-grade dysplasia. She has not followed up with him since that time  Since having the polyps removed her anemia has resolved, she is taking oral iron twice a day and she is asking how long she to take this for. She is eating well without any complaints today. She is taking omeprazole 20 g twice daily and states that her reflux is well controlled on this regimen. She denies any blood in her stools.  EGD 04/18/2015 - large gastric polyps, few cm in size, inflamed - cause of anemia Colonoscopy 04/18/2015 - 4 adenomas, recall colonoscopy 03/2018 EGD 07/2015 - large hyperplastic polyps, one with high grade dysplasia removed via EMR - Dr. Arsenio Loader EGD 10/2015 - EMR of residual large hyperplastic polyp, with focal high grade dysplasia. Other fundic gland polyps  Hgb of 15s, anemia has resolved.    Past Medical History:  Diagnosis Date  . Allergic rhinitis   . Allergy   . Anemia   . Chronic low back pain   . DDD (degenerative disc disease), lumbar   . GERD (gastroesophageal reflux disease)    w/ hx diaphragmatic hernia  . Glaucoma   . GLAUCOMA NOS 03/11/2006   Qualifier: Diagnosis of  By: Diona Browner MD, Amy    . Heart murmur   . Hx of fracture    R arm, foot  . Hyperlipemia   .  Hypertension   . IBS (irritable bowel syndrome)   . Low back pain 11/22/2012  . Meningioma ([place])    ant falx, stable on prior imaging  . Moderate intellectual disabilities   . Murmur   . Obesity   . Osteoarthrosis, unspecified whether generalized or localized, ankle and foot   . Sarcoidosis    difuse bony lesions and LDA, dx by biopsy in 2016  . Transaminitis   . Type II or unspecified type diabetes mellitus without mention of complication, not stated as uncontrolled      Past Surgical History:  Procedure Laterality Date  . CATARACT EXTRACTION Bilateral    Dr Katy Fitch  . COLONOSCOPY  04/15/2011  . ELBOW SURGERY     right elbow  . LIPOMA EXCISION Left [id number]   posterior left axilla  . TOTAL ABDOMINAL HYSTERECTOMY  03/05/1992   leiomyoma and cellular leiomyoma   Family History  Problem Relation Age of Onset  . Diabetes Mother   . Stroke Mother   . Hypertension Mother   . Stroke Father   . Diabetes Maternal Aunt   . Heart disease Maternal Aunt         (had pacemaker)  . Diabetes Maternal Grandmother   . Stroke Maternal Grandmother   . Hypertension Maternal Grandmother   . Colon cancer Neg Hx    Social History  Substance Use Topics  .  Smoking status: Never Smoker  . Smokeless tobacco: Never Used  . Alcohol use No   Current Outpatient Prescriptions  Medication Sig Dispense Refill  . acetaminophen (TYLENOL) 500 MG tablet Take 500 mg by mouth as needed (left ear pain). Reported on 04/18/2015    . diltiazem (CARDIZEM CD) 240 MG 24 hr capsule TAKE ONE CAPSULE BY MOUTH EVERY DAY 90 capsule 2  . ferrous sulfate 325 (65 FE) MG tablet TAKE 1 TABLET (325 MG TOTAL) BY MOUTH 2 (TWO) TIMES DAILY WITH A MEAL. 60 tablet 1  . fluconazole (DIFLUCAN) 150 MG tablet Take 1 tablet (150 mg total) by mouth once a week. 2 tablet 0  . fluticasone (FLONASE) 50 MCG/ACT nasal spray Place into both nostrils daily.    . hydrochlorothiazide (MICROZIDE) 12.5 MG capsule TAKE 1 CAPSULE BY MOUTH ONCE  DAILY 90 capsule 1  . hydrocortisone (ANUSOL-HC) 25 MG suppository UNWRAP AND INSERT 1 SUPPOSITORY IN THE RECTUM TWICE A DAY  1  . hydrocortisone 2.5 % ointment APPLY TO AFFECTED AREA TWICE A DAY  0  . ipratropium (ATROVENT) 0.06 % nasal spray PLACE 2 SPRAYS INTO BOTH NOSTRILS 4 (FOUR) TIMES DAILY. 45 mL 2  . ketoconazole (NIZORAL) 2 % shampoo APPLY TO AFFECTED AREA TWICE A WEEK AS DIRECTED 120 mL 5  . Lactobacillus (FLORAJEN ACIDOPHILUS PO) Take 1 tablet by mouth daily.    Marland Kitchen latanoprost (XALATAN) 0.005 % ophthalmic solution Place 1 drop into both eyes at bedtime.     Marland Kitchen liver oil-zinc oxide (DESITIN) 40 % ointment Apply 1 application topically as needed for irritation.    . Loperamide HCl 1 MG/7.5ML SUSP Take 1 mg by mouth.    . loratadine (CLARITIN) 10 MG tablet Take 10 mg by mouth daily.    . metFORMIN (GLUCOPHAGE) 500 MG tablet TAKE 1 TABLET BY MOUTH TWICE A DAY WITH A MEAL 180 tablet 1  . omeprazole (PRILOSEC) 20 MG capsule TAKE 1 CAPSULE (20 MG TOTAL) BY MOUTH 2 (TWO) TIMES DAILY. 180 capsule 1  . potassium chloride SA (KLOR-CON M20) 20 MEQ tablet Take 1 tablet (20 mEq total) by mouth daily. 90 tablet 3  . pravastatin (PRAVACHOL) 40 MG tablet TAKE 1 TABLET BY MOUTH EVERY EVENING 90 tablet 1  . Propylene Glycol (SYSTANE BALANCE) 0.6 % SOLN     . traMADol (ULTRAM) 50 MG tablet Take 50 mg by mouth daily.  0  . UNKNOWN TO PATIENT Over the counter pill for her ears, takes 4 pills twice a day, unsure of name of meds     No current facility-administered medications for this visit.    Allergies  Allergen Reactions  . Ace Inhibitors Cough  . Amoxicillin-Pot Clavulanate Diarrhea  . Augmentin [Amoxicillin-Pot Clavulanate] Other (See Comments)    diarrhea  . Azithromycin Diarrhea  . Ceftin [Cefuroxime Axetil]     diarrhea  . Cefuroxime Diarrhea and Other (See Comments)    Generalized weakness  . Cyclobenzaprine Hcl Diarrhea  . Flexeril [Cyclobenzaprine] Diarrhea  . Flonase [Fluticasone  Propionate] Other (See Comments)    Nose bleed  . Fluticasone Other (See Comments)    Nose bleed  . Ibuprofen Other (See Comments)    REACTION: vomiting     Review of Systems: All systems reviewed and negative except where noted in HPI.   Lab Results  Component Value Date   WBC 6.9 08/18/2016   HGB 15.5 (H) 08/18/2016   HCT 47.5 (H) 08/18/2016   MCV 90.5 08/18/2016   PLT 331.0  08/18/2016   ' Lab Results  Component Value Date   CREATININE 0.48 08/18/2016   BUN 13 08/18/2016   NA 142 08/18/2016   K 4.1 08/18/2016   CL 107 08/18/2016   CO2 26 08/18/2016     Physical Exam: BP 120/70   Pulse 84   Ht 5\' 2"  (1.575 m)   Wt 191 lb (86.6 kg)   LMP 01/21/1992 (Approximate)   BMI 34.93 kg/m  Constitutional: Pleasant,well-developed, female in no acute distress. HEENT: Normocephalic and atraumatic. Conjunctivae are normal. No scleral icterus. Neck supple.  Cardiovascular: Normal rate, regular rhythm.  Pulmonary/chest: Effort normal and breath sounds normal. No wheezing, rales or rhonchi. Abdominal: Soft, nondistended, protuberant.  There are no masses palpable. Extremities: no edema Lymphadenopathy: No cervical adenopathy noted. Neurological: Alert and oriented to person place and time. Skin: Skin is warm and dry. No rashes noted. Psychiatric: Normal mood and affect. Behavior is normal.   ASSESSMENT AND PLAN: 61 year old female with a history of iron deficiency anemia secondary to large and inflamed hyperplastic gastric polyps. She has undergone EMR of the largest polyps with Dr. Arsenio Loader at [place], last done in October 2017. She has had 2 resections with polyps containing high-grade dysplasia. Her anemia has since resolved.  I think she warrants a follow-up endoscopy with reassessment of EMR sites given high-grade dysplasia noted in the polyps. This should be done with Dr. Arsenio Loader since he has been managing this issue for her. In the interim given her anemia is stable  recommend she decrease her oral iron to once daily, and repeat CBC in a few months to ensure stable.   We called over to Dr. Lissa Morales office, she is scheduled for EGD with EMR on October 26. This was relayed to the patient and her neighbor Manuela Schwartz, who helps coordinate her health care appointments. Recall colonoscopy in March 2020.   Cellar, MD [facility] Pager [phone]

## 2016-09-04 ENCOUNTER — Other Ambulatory Visit: Payer: Self-pay | Admitting: Family Medicine

## 2016-09-04 ENCOUNTER — Other Ambulatory Visit: Payer: Self-pay

## 2016-09-04 ENCOUNTER — Telehealth: Payer: Self-pay | Admitting: Gastroenterology

## 2016-09-04 MED ORDER — FERROUS SULFATE 325 (65 FE) MG PO TABS: 325.0000 mg | ORAL_TABLET | Freq: Every day | ORAL | 3 refills | Status: DC

## 2016-09-04 NOTE — Telephone Encounter (Signed)
Fe sent to [facility] on [address]

## 2016-09-05 ENCOUNTER — Telehealth: Payer: Self-pay | Admitting: Certified Nurse Midwife

## 2016-09-05 NOTE — Telephone Encounter (Signed)
Agree with recommendations.  Ok to close.

## 2016-09-05 NOTE — Telephone Encounter (Signed)
Patient is asking to talk with Regina Eck nurse.

## 2016-09-05 NOTE — Telephone Encounter (Signed)
Patient called to confirm she could use Balmex on skin for 2-3 days if irritated but if doesn't resolve will need appointment. She wanted to know if this would "help with sweating" and I advised it would not but would provide barrier for skin against the sweat.Routing to [place]

## 2016-09-06 DIAGNOSIS — Z23 Encounter for immunization: Secondary | ICD-10-CM | POA: Diagnosis not present

## 2016-09-12 ENCOUNTER — Telehealth: Payer: Self-pay | Admitting: Certified Nurse Midwife

## 2016-09-12 NOTE — Telephone Encounter (Signed)
Spoke with patient. Patient states that she took her last dose of Diflucan yesterday. Patient noticed she has slight red irritation to skin under her abdomen. Asking if she may use Balmex. Advised may apply thin layer of Balmex to the area once a day for 3 days. Patient verbalizes understanding.   Routing to provider for final review. Patient agreeable to disposition. Will close encounter.

## 2016-09-12 NOTE — Telephone Encounter (Signed)
agree

## 2016-09-12 NOTE — Telephone Encounter (Signed)
Patient states she is starting to have a rash under her stomach and on her side. She wants to know if she should start using Balmex.

## 2016-09-13 ENCOUNTER — Other Ambulatory Visit: Payer: Self-pay | Admitting: Family Medicine

## 2016-09-15 ENCOUNTER — Telehealth: Payer: Self-pay | Admitting: Certified Nurse Midwife

## 2016-09-15 NOTE — Telephone Encounter (Signed)
Patient states she was given a cream for her rectal area.  She is not sure if she is still irritated and would like to speak to a nurse.

## 2016-09-15 NOTE — Telephone Encounter (Signed)
Spoke with patient. Requesting OV for rectal irritation not relieved by balmex. Patient ask that Melvia Heaps, CNM "take a look at it". Patient scheduled for OV on 09/17/16 at 11am with Melvia Heaps, CNM. Patient is agreeable to date and time.  Routing to provider for final review. Patient is agreeable to disposition. Will close encounter.

## 2016-09-17 ENCOUNTER — Ambulatory Visit (INDEPENDENT_AMBULATORY_CARE_PROVIDER_SITE_OTHER): Payer: Medicare Other | Admitting: Certified Nurse Midwife

## 2016-09-17 ENCOUNTER — Encounter: Payer: Self-pay | Admitting: Certified Nurse Midwife

## 2016-09-17 VITALS — BP 110/68 | HR 68 | Resp 16 | Ht 62.0 in | Wt 192.0 lb

## 2016-09-17 DIAGNOSIS — Z8639 Personal history of other endocrine, nutritional and metabolic disease: Secondary | ICD-10-CM

## 2016-09-17 DIAGNOSIS — L900 Lichen sclerosus et atrophicus: Secondary | ICD-10-CM | POA: Diagnosis not present

## 2016-09-17 DIAGNOSIS — L290 Pruritus ani: Secondary | ICD-10-CM

## 2016-09-17 NOTE — Progress Notes (Signed)
61 y.o. Single Caucasian female [name] here with complaint of rectal itching. Denies blood in stool, diarrhea or new soap use. Recently saw GI specialist and was started on iron supplement.  Has another follow up at [facility] in 10/18. Patient filled Rx and is taking daily. Denies any urinary symptoms of frequency or urgency. Denies vaginal symptoms of itching or burning. Using Coconut oil prn for vaginal itching. Has not had any loose stool . No new underwear or depends type change. Onset of symptoms 7 days ago. Denies new personal products or vaginal dryness. Completed her Diflucan series with out problems.   ROS Pertinent to above  O:Healthy female WDWN, has had a hair perm Affect: normal, orientation x 3  Exam:skin warm and dry Abdomen:soft, non tender  Inguinal Lymph nodes: no enlargement or tenderness Groin area: chapped appearance, but no weeping or exudate noted, or redness.  Pelvic exam: External genital: normal female with enlarged right vulva, no size change since last exam, history of mass with sarcoidosis site, atrophic appearance, no LS flare noted, no scaling or redness BUS: negative Vagina: scant discharge noted.at introitus, no pelvic exam done Rectal area: very small 3 in area above rectal opening on perineal area small lace like pattern noted. Lichen appearance. No scaling or redness. Rectal exam done : normal, no blood noted or hemorrhoids   A:Normal external female exam Rectal itching due to Lichen Sclerosis flare Mentally challenged History of vulva mass sarcoidosis site  P:Discussed findings of Lichen Sclerosis and she has a history of, but no issues in a long time. She has steroid cream and home and was instructed to use once daily x 5 days. Did not have patient to use bid due to thinning of skin problems she has had. Can continue Balmex use around rectal opening only. Patient given instructions and she read back slowly to me and understood instructions. Warnings of  using "a lot of cream " given. Patient will call if concerns or continues.  Rv prn

## 2016-09-17 NOTE — Patient Instructions (Signed)
Apply Hydrocortisone cream to rectal area above rectum once daily for 5 days only.

## 2016-09-23 ENCOUNTER — Telehealth: Payer: Self-pay | Admitting: Certified Nurse Midwife

## 2016-09-23 NOTE — Telephone Encounter (Signed)
Patient would like to speak with nurse about her medication. °

## 2016-09-23 NOTE — Telephone Encounter (Signed)
Spoke with patient. Patient states that over the weekend she began having itching on her inner thigh close to her vagina. Went to the pharmacy to get more Balmex and was advised to get anti-itch powder. Patient did not buy any powder stating "The doctor told me not to use powder." Denies any redness or irritation to the area. States today she has not had any itching. Advised okay to monitor and if persists or worsens needs OV. Patient is agreeable. 17 minute phone call.  Routing to provider for final review. Patient agreeable to disposition. Will close encounter.

## 2016-09-23 NOTE — Telephone Encounter (Signed)
Left message to call Kaitlyn at [phone]. 

## 2016-09-23 NOTE — Telephone Encounter (Signed)
Patient is calling to speak with the nurse.

## 2016-09-23 NOTE — Telephone Encounter (Signed)
Spoke with patient. Patient states that she had a bowel movement and used baby wipes. Had an episode of rectal itching following. Asking if she can apply Balmex rectally. Patient did complete 5 days of applying hydrocortisone topically above the rectum. Advised she may use Balmex at the rectal opening only. Patient verbalizes understanding. 9 minute phone call with patient.  Routing to provider for final review. Patient agreeable to disposition. Will close encounter.

## 2016-09-23 NOTE — Telephone Encounter (Signed)
Patient would like to speak with nurse about a creme she's taking.

## 2016-09-24 DIAGNOSIS — Z1231 Encounter for screening mammogram for malignant neoplasm of breast: Secondary | ICD-10-CM | POA: Diagnosis not present

## 2016-09-24 LAB — HM MAMMOGRAPHY

## 2016-09-25 ENCOUNTER — Telehealth: Payer: Self-pay | Admitting: Certified Nurse Midwife

## 2016-09-25 NOTE — Telephone Encounter (Signed)
Spoke with patient. Patient states that she has irritation of her skin on the left side in the groin area. States it is tender and is starting to break out. "It is not too bad, but I don't want it to get worse." Advised she may apply a small amount of Balmex to the area once a day for 3 days. Advised if symptoms worsen will need evaluation. Patient is agreeable.  Linda Thompson CNM do you agree with recommendations?

## 2016-09-25 NOTE — Telephone Encounter (Signed)
agree

## 2016-09-25 NOTE — Telephone Encounter (Signed)
Patient states she has broke out down there from using the balmex. She wants to know if she still needs to use this medication.

## 2016-10-02 ENCOUNTER — Ambulatory Visit (INDEPENDENT_AMBULATORY_CARE_PROVIDER_SITE_OTHER): Payer: Medicare Other | Admitting: Podiatry

## 2016-10-02 ENCOUNTER — Ambulatory Visit (INDEPENDENT_AMBULATORY_CARE_PROVIDER_SITE_OTHER): Payer: Medicare Other

## 2016-10-02 ENCOUNTER — Telehealth: Payer: Self-pay

## 2016-10-02 ENCOUNTER — Encounter: Payer: Self-pay | Admitting: Podiatry

## 2016-10-02 VITALS — BP 144/99 | HR 96 | Resp 16

## 2016-10-02 DIAGNOSIS — S93492D Sprain of other ligament of left ankle, subsequent encounter: Secondary | ICD-10-CM

## 2016-10-02 DIAGNOSIS — M79672 Pain in left foot: Secondary | ICD-10-CM

## 2016-10-02 MED ORDER — MELOXICAM 15 MG PO TABS: 15.0000 mg | ORAL_TABLET | Freq: Every day | ORAL | 0 refills | Status: DC

## 2016-10-02 NOTE — Telephone Encounter (Signed)
Spoke with patient at time of incoming call. Patient states that she has intermittent rectal itching. Has been using baby wipes after bowel movements. On occasion she is using Balmex for irritation. Reports she applies a small amount externally to her rectum. Not doing this daily only as needed. Asking if this is okay. Advised okay to use Balmex as needed. Will need to ensure she is keeping the area clean. Patient verbalizes understanding.  Routing to provider for final review. Patient agreeable to disposition. Will close encounter.

## 2016-10-02 NOTE — Telephone Encounter (Signed)
agree

## 2016-10-02 NOTE — Progress Notes (Signed)
   Subjective:    Patient ID: Linda Thompson, female    DOB: 1955/03/31, 61 y.o.   MRN: [id number]  HPI  Chief Complaint  Patient presents with  . Foot Pain    Left, Dorsal of foot/along medial side  . Ankle Pain    Left, medial side     61 y.o. female returns for the above complaint. Reports a hx of severe ankle sprain at some point, patient has difficulty quantifying when. States she periodically wears an ankle brace and stopped wearing it 3 days ago and she noticed pain at the outside of her ankle.  Patient has intellectual disability and is a poor historian.  Review of Systems  All other systems reviewed and are negative.     Objective:   Physical Exam Vitals:   10/02/16 1436  BP: (!) 144/99  Pulse: 96  Resp: 16   General AA&O x3. Normal mood and affect.  Vascular Dorsalis pedis and posterior tibial pulses  present 2+ bilaterally  Capillary refill normal to all digits. Pedal hair growth diminished.  Neurologic Epicritic sensation grossly present.  Dermatologic No open lesions. Interspaces clear of maceration. Nails well groomed and normal in appearance.  Orthopedic: MMT 5/5 in dorsiflexion, plantarflexion, inversion, and eversion. Normal joint ROM without pain or crepitus. POP L ATFL. No other pain to palpation.   Radiographs: XR reviewed. No acute fractures. No other osseous abnormalities.    Assessment & Plan:  Sprain L ATFL, ?Reinjury -XR reviewed with patient. -Continue Lace up ankle brace -Attempted to Rx Voltaren gel, apparently not covered. -Rx for Meloxicam for pain relief and anti-inflammatory properties.  20 minutes of face to face time were spent with the patient. >50% of this was spent on counseling and coordination of care of the above diagnosis. Patient has documented intellectual disability and had many questions, including repetition, about her treatment plan. Plan discussed at length with patient who verbalized understanding.

## 2016-10-03 ENCOUNTER — Encounter: Payer: Self-pay | Admitting: Family Medicine

## 2016-10-03 ENCOUNTER — Telehealth: Payer: Self-pay | Admitting: Podiatry

## 2016-10-03 NOTE — Telephone Encounter (Addendum)
Left message for pt to call and I would check for a simpler brace or strap. Dr. March Rummage states compression sock may provide some relief if pt is unable to apply ankle brace. I informed pt and she will come in on 10/06/2016.10/06/2016-Pt presents to office with lace and strap-type ankle brace, stating she is unable to put on the brace and dose not want to try again. I informed pt that the compression sock may provide some support, to wear during the day take off at night and wash then allow to air dry. I place the compression sock on the left foot and explained the inside lab should be at the top. Pt states understanding.

## 2016-10-03 NOTE — Telephone Encounter (Signed)
Pt called in regards to brace. States Dr. March Rummage told me yesterday he wants me to wear it for four weeks. The problem is I need help because its complicated for me to put on myself. So I was wanting to know if it's okay for me to keep it off because if I have to wear it I will need someone to help me put it on.

## 2016-10-09 ENCOUNTER — Telehealth: Payer: Self-pay | Admitting: Certified Nurse Midwife

## 2016-10-09 DIAGNOSIS — J310 Chronic rhinitis: Secondary | ICD-10-CM | POA: Diagnosis not present

## 2016-10-09 DIAGNOSIS — H9313 Tinnitus, bilateral: Secondary | ICD-10-CM | POA: Diagnosis not present

## 2016-10-09 NOTE — Telephone Encounter (Signed)
Spoke with patient. Reports increased sweating and "little redness, enough to notice", in abdominal fold. Denies itching. States she has been keeping area dry, asking if she can apply balmex? Advised patient would review with Melvia Heaps, CNM and return call. Patient agreeable.   Reviewed with Melvia Heaps, CNM, may use pea size amount of balmex, don't coat it. Keep area dry. Return call to office if symptoms worsen.   Spoke with patient, advised patient as seen above per Melvia Heaps, CNM. Patient repeated back instructions, verbalizes understanding.   Routing to provider for final review. Patient is agreeable to disposition. Will close encounter.

## 2016-10-09 NOTE — Telephone Encounter (Signed)
Patient called requesting to speak with the nurse. She said she'd like to know if she can put Balmex in her vaginal area.

## 2016-10-13 ENCOUNTER — Other Ambulatory Visit: Payer: Self-pay | Admitting: Family Medicine

## 2016-10-15 ENCOUNTER — Telehealth: Payer: Self-pay | Admitting: Certified Nurse Midwife

## 2016-10-15 NOTE — Telephone Encounter (Signed)
Patient using Balmex under stomach and states it is not worse wants to make sure it would be ok to continue to use.

## 2016-10-15 NOTE — Telephone Encounter (Signed)
Spoke with patient. Patient states that she has noticed her skin becoming pink under her stomach. Has applied Balmex 1 time last night. Asking if it is okay to continue using. Advised may apply small thin layer of Balmex to the area x 3 days. Patient is agreeable. Aware if symptoms worsen will need to be seen.  Routing to provider for final review. Patient agreeable to disposition. Will close encounter.

## 2016-10-20 ENCOUNTER — Telehealth: Payer: Self-pay | Admitting: Certified Nurse Midwife

## 2016-10-20 NOTE — Telephone Encounter (Signed)
Patient would like to speak with a nurse about taking an over the counter medication  Routing to Triage Nurse

## 2016-10-20 NOTE — Telephone Encounter (Signed)
Spoke with patient. Reports rectal itching after bowel movements. Patient states she has had "several" loose, watery bowel movements today and wants to know if she can use balmex for rectal itching. Reports cleaning rectum with baby wipes and drying after each bowel movement. Denies fever, N/V, abdominal cramping.   Advised patient can apply small amount of balmex to rectum for itching. Keep rectum clean and dry after each BM. Recommended f/u with PCP for evaluation of several loose, watery stools on 10/1. Patient verbalizes understanding and is agreeable.   Routing to covering provider for final review. Patient is agreeable to disposition. Will close encounter.  Cc: Melvia Heaps, CNM

## 2016-10-21 ENCOUNTER — Ambulatory Visit (INDEPENDENT_AMBULATORY_CARE_PROVIDER_SITE_OTHER): Payer: Medicare Other | Admitting: Family Medicine

## 2016-10-21 ENCOUNTER — Encounter: Payer: Self-pay | Admitting: Family Medicine

## 2016-10-21 VITALS — BP 128/84 | HR 94 | Temp 97.9°F | Ht 62.0 in | Wt 197.2 lb

## 2016-10-21 DIAGNOSIS — R195 Other fecal abnormalities: Secondary | ICD-10-CM

## 2016-10-21 NOTE — Progress Notes (Signed)
HPI:  Acute visit for "loose stools": -started a few days ago -had a few loose stools/day for a few days -no BM today -denies: recurrent watery diarrhea, melena, hematochezia, inability to tolerate oral fluids, fevers, nausea, vomiting or abdominal pain -She reports she has follow-up with her gastroenterologist next week   ROS: See pertinent positives and negatives per HPI.  Past Medical History:  Diagnosis Date  . Allergic rhinitis   . Allergy   . Anemia   . Chronic low back pain   . DDD (degenerative disc disease), lumbar   . GERD (gastroesophageal reflux disease)    w/ hx diaphragmatic hernia  . Glaucoma   . GLAUCOMA NOS 03/11/2006   Qualifier: Diagnosis of  By: Diona Browner MD, Amy    . Heart murmur   . Hx of fracture    R arm, foot  . Hyperlipemia   . Hypertension   . IBS (irritable bowel syndrome)   . Low back pain 11/22/2012  . Meningioma ([place])    ant falx, stable on prior imaging  . Moderate intellectual disabilities   . Murmur   . Obesity   . Osteoarthrosis, unspecified whether generalized or localized, ankle and foot   . Sarcoidosis    difuse bony lesions and LDA, dx by biopsy in 2016  . Transaminitis   . Type II or unspecified type diabetes mellitus without mention of complication, not stated as uncontrolled     Past Surgical History:  Procedure Laterality Date  . CATARACT EXTRACTION Bilateral    Dr Katy Fitch  . COLONOSCOPY  04/15/2011  . ELBOW SURGERY     right elbow  . LIPOMA EXCISION Left [id number]   posterior left axilla  . TOTAL ABDOMINAL HYSTERECTOMY  03/05/1992   leiomyoma and cellular leiomyoma    Family History  Problem Relation Age of Onset  . Diabetes Mother   . Stroke Mother   . Hypertension Mother   . Stroke Father   . Diabetes Maternal Aunt   . Heart disease Maternal Aunt         (had pacemaker)  . Diabetes Maternal Grandmother   . Stroke Maternal Grandmother   . Hypertension Maternal Grandmother   . Colon cancer Neg Hx     Social  History   Social History  . Marital status: Single    Spouse name: N/A  . Number of children: 0  . Years of education: N/A   Occupational History  . Disabled (from arm fracture)    Social History Main Topics  . Smoking status: Never Smoker  . Smokeless tobacco: Never Used  . Alcohol use No  . Drug use: No  . Sexual activity: No     Comment: Hyst--TAH--unsure if has ovaries   Other Topics Concern  . None   Social History Narrative         Currently-disability for arm fracture      Mental Retardation, but is able to drive to places she is comfortable with and prepare her own meals and manage most of her own affairs.      [name] (neighbor) is POA/HCPOA      Reports she gets regular exercise and tries to eat healthy     Current Outpatient Prescriptions:  .  acetaminophen (TYLENOL) 500 MG tablet, Take 500 mg by mouth as needed (left ear pain). Reported on 04/18/2015, Disp: , Rfl:  .  diltiazem (CARDIZEM CD) 240 MG 24 hr capsule, TAKE ONE CAPSULE BY MOUTH EVERY DAY, Disp: 90 capsule, Rfl:  2 .  ferrous sulfate 325 (65 FE) MG tablet, Take 1 tablet (325 mg total) by mouth daily with breakfast., Disp: 90 tablet, Rfl: 3 .  fluconazole (DIFLUCAN) 150 MG tablet, Take 1 tablet (150 mg total) by mouth once a week., Disp: 2 tablet, Rfl: 0 .  fluticasone (FLONASE) 50 MCG/ACT nasal spray, Place into both nostrils daily., Disp: , Rfl:  .  hydrochlorothiazide (MICROZIDE) 12.5 MG capsule, TAKE 1 CAPSULE BY MOUTH ONCE DAILY, Disp: 90 capsule, Rfl: 1 .  hydrocortisone (ANUSOL-HC) 25 MG suppository, UNWRAP AND INSERT 1 SUPPOSITORY IN THE RECTUM TWICE A DAY, Disp: , Rfl: 1 .  hydrocortisone 2.5 % ointment, APPLY TO AFFECTED AREA TWICE A DAY, Disp: , Rfl: 0 .  ipratropium (ATROVENT) 0.06 % nasal spray, PLACE 2 SPRAYS INTO BOTH NOSTRILS 4 (FOUR) TIMES DAILY., Disp: 45 mL, Rfl: 2 .  ketoconazole (NIZORAL) 2 % shampoo, APPLY TO AFFECTED AREA TWICE A WEEK AS DIRECTED, Disp: 120 mL, Rfl: 5 .   KLOR-CON M20 20 MEQ tablet, TAKE 1 TABLET (20 MEQ TOTAL) BY MOUTH DAILY., Disp: 90 tablet, Rfl: 1 .  Lactobacillus (FLORAJEN ACIDOPHILUS PO), Take 1 tablet by mouth daily., Disp: , Rfl:  .  latanoprost (XALATAN) 0.005 % ophthalmic solution, Place 1 drop into both eyes at bedtime. , Disp: , Rfl:  .  liver oil-zinc oxide (DESITIN) 40 % ointment, Apply 1 application topically as needed for irritation., Disp: , Rfl:  .  Loperamide HCl 1 MG/7.5ML SUSP, Take 1 mg by mouth., Disp: , Rfl:  .  loratadine (CLARITIN) 10 MG tablet, Take 10 mg by mouth daily., Disp: , Rfl:  .  meloxicam (MOBIC) 15 MG tablet, Take 1 tablet (15 mg total) by mouth daily., Disp: 30 tablet, Rfl: 0 .  metFORMIN (GLUCOPHAGE) 500 MG tablet, TAKE 1 TABLET BY MOUTH TWICE A DAY WITH A MEAL, Disp: 180 tablet, Rfl: 1 .  omeprazole (PRILOSEC) 20 MG capsule, TAKE 1 CAPSULE (20 MG TOTAL) BY MOUTH 2 (TWO) TIMES DAILY., Disp: 180 capsule, Rfl: 1 .  pravastatin (PRAVACHOL) 40 MG tablet, TAKE 1 TABLET BY MOUTH EVERY EVENING, Disp: 90 tablet, Rfl: 1 .  Propylene Glycol (SYSTANE BALANCE) 0.6 % SOLN, , Disp: , Rfl:  .  traMADol (ULTRAM) 50 MG tablet, Take 50 mg by mouth daily., Disp: , Rfl: 0 .  UNKNOWN TO PATIENT, Over the counter pill for her ears, takes 4 pills twice a day, unsure of name of meds, Disp: , Rfl:  .  Vitamins-Lipotropics ([employer] INNER EAR PLUS PO), Take by mouth., Disp: , Rfl:   EXAM:  Vitals:   10/21/16 1401  BP: 128/84  Pulse: 94  Temp: 97.9 F (36.6 C)    Body mass index is 36.07 kg/m.  GENERAL: vitals reviewed and listed above, alert, oriented, appears well hydrated and in no acute distress  HEENT: atraumatic, conjunttiva clear, no obvious abnormalities on inspection of external nose and ears  NECK: no obvious masses on inspection  LUNGS: clear to auscultation bilaterally, no wheezes, rales or rhonchi, good air movement  CV: HRRR, no peripheral edema  Abdomen: soft, bowel sounds present in all 4 quadrants,  nontender to palpation  MS: moves all extremities without noticeable abnormality  PSYCH: pleasant and cooperative, no obvious depression or anxiety  ASSESSMENT AND PLAN:  Discussed the following assessment and plan:  Loose stools  -Seems to have resolved -Advised good oral hydration, Imodium if needed per instructions, follow-up with GI as planned -Patient advised to return or notify  a doctor immediately if symptoms worsen or persist or new concerns arise.  Patient Instructions  BEFORE YOU LEAVE: -follow up: as scheduled  Plenty of fluids.  Imodium if needed if any further diarrhea.  Follow up with your gastroenterologist as planned and let them know if any persistent issues.      [name] R., DO

## 2016-10-21 NOTE — Patient Instructions (Addendum)
BEFORE YOU LEAVE: -follow up: as scheduled  Plenty of fluids.  Imodium if needed if any further diarrhea.  Follow up with your gastroenterologist as planned and let them know if any persistent issues.

## 2016-10-23 ENCOUNTER — Telehealth: Payer: Self-pay | Admitting: Certified Nurse Midwife

## 2016-10-23 NOTE — Telephone Encounter (Signed)
Patient would like to speak with nurse about her medication. °

## 2016-10-23 NOTE — Telephone Encounter (Signed)
Spoke with patient. States she is "fixing to break out on my left side, can I use balmex?" Denies redness or itching on side of abdomen, states area gets moist. Reviewed keeping area clean and dry, may apply "pea size" amount of balmex, return call to office  if symptoms worsen or don't resolve.   Patient read back instructions, verbalizes understanding. Advised will return call with any additional instructions form Melvia Heaps, CNM once reviewed.   Routing to provider for final review. Patient is agreeable to disposition. Will close encounter.

## 2016-10-25 DIAGNOSIS — L309 Dermatitis, unspecified: Secondary | ICD-10-CM | POA: Diagnosis not present

## 2016-10-25 DIAGNOSIS — L304 Erythema intertrigo: Secondary | ICD-10-CM | POA: Diagnosis not present

## 2016-10-27 ENCOUNTER — Telehealth: Payer: Self-pay | Admitting: Certified Nurse Midwife

## 2016-10-27 NOTE — Telephone Encounter (Signed)
Spoke with patient. Reports going to urgent care on 10/25/16 for rectal itching and irritation. Was prescribed clindamycin 150 mg tid x5 days and clotrimazole 1% bid x7 days.   Patient did not start clindamycin after talking to pharmacist, request to schedule f/u with Melvia Heaps, CNM. Patient states she has been applying cream to rectum, symptoms improving. Patient is aware Melvia Heaps, CNM out of the office today, request OV for 10/10. Patient scheduled for 10/10 at 3:15pm.   Patient was seen at [facility] on 10/25/16, [phone].  Advised patient RN would f/u with urgent care for copy of OV notes for Melvia Heaps, CNM to review.   Advised patient would review with covering provider and return call with any additional recommendations, patient is agreeable.      Cc: Melvia Heaps, CNM

## 2016-10-27 NOTE — Telephone Encounter (Signed)
OV notes received and placed on Melvia Heaps, CNM Desk for review, will close encounter.

## 2016-10-27 NOTE — Telephone Encounter (Signed)
Patient has a question about her medication

## 2016-10-27 NOTE — Telephone Encounter (Signed)
Spoke with Linda Thompson, request copy of OV notes and labs dated 10/25/16 be faxed to [phone].

## 2016-10-28 ENCOUNTER — Other Ambulatory Visit: Payer: Self-pay | Admitting: Podiatry

## 2016-10-29 ENCOUNTER — Encounter: Payer: Self-pay | Admitting: Certified Nurse Midwife

## 2016-10-29 ENCOUNTER — Ambulatory Visit (INDEPENDENT_AMBULATORY_CARE_PROVIDER_SITE_OTHER): Payer: Medicare Other | Admitting: Certified Nurse Midwife

## 2016-10-29 VITALS — BP 120/80 | HR 70 | Resp 16 | Ht 62.0 in | Wt 197.0 lb

## 2016-10-29 DIAGNOSIS — B372 Candidiasis of skin and nail: Secondary | ICD-10-CM

## 2016-10-29 DIAGNOSIS — B3731 Acute candidiasis of vulva and vagina: Secondary | ICD-10-CM

## 2016-10-29 DIAGNOSIS — B373 Candidiasis of vulva and vagina: Secondary | ICD-10-CM | POA: Diagnosis not present

## 2016-10-29 MED ORDER — FLUCONAZOLE 150 MG PO TABS: ORAL_TABLET | ORAL | 0 refills | Status: DC

## 2016-10-29 NOTE — Telephone Encounter (Signed)
Pt needs an appt prior to future refills. 

## 2016-10-29 NOTE — Progress Notes (Signed)
61 y.o. Single Caucasian female [name] here with complaint of vaginal symptoms of some  Vaginal itching and some redness in right groin area again. Has been wearing house dresses(loose) when home to allow area to dry out. Applying no cream to area at this point. Was seen at Urgent care and given Clotrimazole cream for rectal itching and Clindamycin for skin infection in groin. Patient was advised by pharmacy she may want to come in here and be checked prior to filling Rx. Patient denies any breaks in skin or bleeding in groin area. "Looks the same as it does when I have been before". No rectal bleeding or itching now. Use Clotrimazole cream given at Urgent care. No new personal products. No new problems. Took 6 weeks of Diflucan with good results with weekly dose. Blood sugars good per patient. History of Type 2 Diabetes.           Review of Systems  Constitutional: Negative.   Gastrointestinal: Negative for constipation and diarrhea.  Genitourinary: Negative for frequency and urgency.  Skin: Negative for itching and rash.    O:Healthy female WDWN  Affect: normal, orientation x 3  Exam: Skin warm and dry and no excoriations or rash  noted Abdomen: soft, non tender Groin on right slight weeping noted increase pink not red, no open skin or bleeding noted, wet prep taken, non tender Left groin: chapped appearance from frequent breakdown of skin, but no active issues.  Inguinal Lymph nodes: no enlargement or tenderness Pelvic exam: External genital: normal female with enlarged right vulva, known sarcoidosis site, no change in size , no scaling or redness, no LS flare noted BUS: negative Vagina: scant discharge noted. Ph: 4.0  ,Wet prep taken,  Cervix/Uterus absent Adnexa: surgically absent, no masses or fullness noted   Wet Prep results: KOH, Saline positive for yeast vaginal and groin area right   A:Normal pelvic exam Yeast vaginitis Yeast Dermatitis chronic history Type 2 diabetes in  fair control Mentally challenged  P:Discussed findings of yeast vaginitis and yeast dermatitis as before and etiology.   Avoid moist clothes or pads for extended period of time. Discussed applying Clotrimazole cream in right groin area thinly twice daily. Keep area dry after bathing. Patient voiced understanding. Warning signs of skin breakdown discussed and importance of calling. Rx: Diflucan see order with instructions to take one today and repeat one in a week as before  Questions addressed.   Rv prn

## 2016-10-29 NOTE — Patient Instructions (Signed)
Use  Wash cloth to dry area on right side  Use cream on right side once daily Take one pink pill today  And one next wednesday

## 2016-11-04 ENCOUNTER — Telehealth: Payer: Self-pay | Admitting: Certified Nurse Midwife

## 2016-11-04 NOTE — Telephone Encounter (Signed)
Patient is asking to talk with [facility]

## 2016-11-04 NOTE — Telephone Encounter (Signed)
Spoke with patient. Patient states that she has been applying Clotrimazole cream to her right side once a day. Is due to take next Diflucan on 11/05/2016. Reports she is having slight rectal itching. Advised may apply a thin layer of Balmex rectally for one time episode of itching. Patient verbalizes understanding.Advised to keep all areas clean and dry. Patient is agreeable.  Routing to provider for final review. Patient agreeable to disposition. Will close encounter.

## 2016-11-05 ENCOUNTER — Ambulatory Visit (INDEPENDENT_AMBULATORY_CARE_PROVIDER_SITE_OTHER): Payer: Medicare Other | Admitting: Podiatry

## 2016-11-05 ENCOUNTER — Encounter: Payer: Self-pay | Admitting: Podiatry

## 2016-11-05 DIAGNOSIS — M79676 Pain in unspecified toe(s): Secondary | ICD-10-CM | POA: Diagnosis not present

## 2016-11-05 DIAGNOSIS — B351 Tinea unguium: Secondary | ICD-10-CM | POA: Diagnosis not present

## 2016-11-05 DIAGNOSIS — E1140 Type 2 diabetes mellitus with diabetic neuropathy, unspecified: Secondary | ICD-10-CM | POA: Diagnosis not present

## 2016-11-05 DIAGNOSIS — S93492D Sprain of other ligament of left ankle, subsequent encounter: Secondary | ICD-10-CM

## 2016-11-05 NOTE — Progress Notes (Addendum)
Patient ID: Linda Thompson, female   DOB: 08/31/1955, 61 y.o.   MRN: [id number] Complaint:  Visit Type: Patient returns to my office for continued preventative foot care services. Complaint: Patient states" my nails have grown long and thick and become painful to walk and wear shoes" Patient has been diagnosed with DM with no foot complications. The patient presents for preventative foot care services. No changes to ROS.  Patient says her ankle is painfree .    Podiatric Exam: Vascular: dorsalis pedis and posterior tibial pulses are palpable bilateral. Capillary return is immediate. Temperature gradient is WNL. Skin turgor WNL  Sensorium: Normal Semmes Weinstein monofilament test. Normal tactile sensation bilaterally. Nail Exam: Pt has thick disfigured discolored nails with subungual debris noted bilateral entire nail hallux through fifth toenails Ulcer Exam: There is no evidence of ulcer or pre-ulcerative changes or infection. Orthopedic Exam: Muscle tone and strength are WNL. No limitations in general ROM. No crepitus or effusions noted. Foot type and digits show no abnormalities. Bony prominences are unremarkable. No ankle pain noted. Skin: No Porokeratosis. No infection or ulcers  Diagnosis:  Onychomycosis, , Pain in right toe, pain in left toes  Treatment & Plan Procedures and Treatment: Consent by patient was obtained for treatment procedures. The patient understood the discussion of treatment and procedures well. All questions were answered thoroughly reviewed. Debridement of mycotic and hypertrophic toenails, 1 through 5 bilateral and clearing of subungual debris. No ulceration, no infection noted. Told her to stop taking medicine since she is painfree.  Told her to stop wearing anklet.  Use both if pain returns. ABN signed for 2018. Return Visit-Office Procedure: Patient instructed to return to the office for a follow up visit 3 months for continued evaluation and treatment.   Gardiner Barefoot DPM

## 2016-11-14 DIAGNOSIS — D132 Benign neoplasm of duodenum: Secondary | ICD-10-CM | POA: Diagnosis not present

## 2016-11-14 DIAGNOSIS — Z791 Long term (current) use of non-steroidal anti-inflammatories (NSAID): Secondary | ICD-10-CM | POA: Diagnosis not present

## 2016-11-14 DIAGNOSIS — Z7984 Long term (current) use of oral hypoglycemic drugs: Secondary | ICD-10-CM | POA: Diagnosis not present

## 2016-11-14 DIAGNOSIS — E785 Hyperlipidemia, unspecified: Secondary | ICD-10-CM | POA: Diagnosis not present

## 2016-11-14 DIAGNOSIS — Z7951 Long term (current) use of inhaled steroids: Secondary | ICD-10-CM | POA: Diagnosis not present

## 2016-11-14 DIAGNOSIS — K317 Polyp of stomach and duodenum: Secondary | ICD-10-CM | POA: Diagnosis not present

## 2016-11-14 DIAGNOSIS — E119 Type 2 diabetes mellitus without complications: Secondary | ICD-10-CM | POA: Diagnosis not present

## 2016-11-14 DIAGNOSIS — Z888 Allergy status to other drugs, medicaments and biological substances status: Secondary | ICD-10-CM | POA: Diagnosis not present

## 2016-11-14 DIAGNOSIS — Z886 Allergy status to analgesic agent status: Secondary | ICD-10-CM | POA: Diagnosis not present

## 2016-11-14 DIAGNOSIS — Z881 Allergy status to other antibiotic agents status: Secondary | ICD-10-CM | POA: Diagnosis not present

## 2016-11-14 DIAGNOSIS — I10 Essential (primary) hypertension: Secondary | ICD-10-CM | POA: Diagnosis not present

## 2016-11-14 DIAGNOSIS — K219 Gastro-esophageal reflux disease without esophagitis: Secondary | ICD-10-CM | POA: Diagnosis not present

## 2016-11-14 DIAGNOSIS — D131 Benign neoplasm of stomach: Secondary | ICD-10-CM | POA: Diagnosis not present

## 2016-11-14 DIAGNOSIS — M1990 Unspecified osteoarthritis, unspecified site: Secondary | ICD-10-CM | POA: Diagnosis not present

## 2016-11-14 DIAGNOSIS — Z79899 Other long term (current) drug therapy: Secondary | ICD-10-CM | POA: Diagnosis not present

## 2016-11-14 DIAGNOSIS — Z880 Allergy status to penicillin: Secondary | ICD-10-CM | POA: Diagnosis not present

## 2016-11-17 ENCOUNTER — Encounter: Payer: Self-pay | Admitting: Family Medicine

## 2016-11-17 ENCOUNTER — Ambulatory Visit (INDEPENDENT_AMBULATORY_CARE_PROVIDER_SITE_OTHER): Payer: Medicare Other | Admitting: Family Medicine

## 2016-11-17 VITALS — BP 102/84 | HR 95 | Temp 97.6°F | Ht 62.0 in | Wt 197.0 lb

## 2016-11-17 DIAGNOSIS — K5900 Constipation, unspecified: Secondary | ICD-10-CM | POA: Diagnosis not present

## 2016-11-17 NOTE — Progress Notes (Signed)
HPI:  Acute visit for Constipation: -had EGD with mucosal resection 3 days ago with her gastroenterologist -did not have a BM the following day so went to urgent care - started colace -normal BM today, but still feels she might be stopped up -denies abd pain, fevers, malaise, vomiting, diarrhea, hematochezia, melena  ROS: See pertinent positives and negatives per HPI.  Past Medical History:  Diagnosis Date  . Allergic rhinitis   . Allergy   . Anemia   . Chronic low back pain   . DDD (degenerative disc disease), lumbar   . GERD (gastroesophageal reflux disease)    w/ hx diaphragmatic hernia  . Glaucoma   . GLAUCOMA NOS 03/11/2006   Qualifier: Diagnosis of  By: Diona Browner MD, Amy    . Heart murmur   . Hx of fracture    R arm, foot  . Hyperlipemia   . Hypertension   . IBS (irritable bowel syndrome)   . Low back pain 11/22/2012  . Meningioma ([place])    ant falx, stable on prior imaging  . Moderate intellectual disabilities   . Murmur   . Obesity   . Osteoarthrosis, unspecified whether generalized or localized, ankle and foot   . Sarcoidosis    difuse bony lesions and LDA, dx by biopsy in 2016  . Transaminitis   . Type II or unspecified type diabetes mellitus without mention of complication, not stated as uncontrolled     Past Surgical History:  Procedure Laterality Date  . CATARACT EXTRACTION Bilateral    Dr Katy Fitch  . COLONOSCOPY  04/15/2011  . ELBOW SURGERY     right elbow  . LIPOMA EXCISION Left [id number]   posterior left axilla  . TOTAL ABDOMINAL HYSTERECTOMY  03/05/1992   leiomyoma and cellular leiomyoma    Family History  Problem Relation Age of Onset  . Diabetes Mother   . Stroke Mother   . Hypertension Mother   . Stroke Father   . Diabetes Maternal Aunt   . Heart disease Maternal Aunt         (had pacemaker)  . Diabetes Maternal Grandmother   . Stroke Maternal Grandmother   . Hypertension Maternal Grandmother   . Colon cancer Neg Hx     Social History    Social History  . Marital status: Single    Spouse name: N/A  . Number of children: 0  . Years of education: N/A   Occupational History  . Disabled (from arm fracture)    Social History Main Topics  . Smoking status: Never Smoker  . Smokeless tobacco: Never Used  . Alcohol use No  . Drug use: No  . Sexual activity: No     Comment: Hyst--TAH--unsure if has ovaries   Other Topics Concern  . None   Social History Narrative         Currently-disability for arm fracture      Mental Retardation, but is able to drive to places she is comfortable with and prepare her own meals and manage most of her own affairs.      Ms. Linda Thompson (neighbor) is POA/HCPOA      Reports she gets regular exercise and tries to eat healthy     Current Outpatient Prescriptions:  .  acetaminophen (TYLENOL) 500 MG tablet, Take 500 mg by mouth as needed (left ear pain). Reported on 04/18/2015, Disp: , Rfl:  .  clotrimazole (LOTRIMIN) 1 % cream, APPLY TWICE A DAY FOR 7 DAYS AS DIRECTED, Disp: , Rfl: 0 .  diltiazem (CARDIZEM CD) 240 MG 24 hr capsule, TAKE ONE CAPSULE BY MOUTH EVERY DAY, Disp: 90 capsule, Rfl: 2 .  ferrous sulfate 325 (65 FE) MG tablet, Take 1 tablet (325 mg total) by mouth daily with breakfast., Disp: 90 tablet, Rfl: 3 .  fluconazole (DIFLUCAN) 150 MG tablet, Take one tablet today and repeat one in one week, Disp: 2 tablet, Rfl: 0 .  fluticasone (FLONASE) 50 MCG/ACT nasal spray, Place into both nostrils daily., Disp: , Rfl:  .  hydrochlorothiazide (MICROZIDE) 12.5 MG capsule, TAKE 1 CAPSULE BY MOUTH ONCE DAILY, Disp: 90 capsule, Rfl: 1 .  hydrocortisone (ANUSOL-HC) 25 MG suppository, UNWRAP AND INSERT 1 SUPPOSITORY IN THE RECTUM TWICE A DAY, Disp: , Rfl: 1 .  hydrocortisone 2.5 % ointment, APPLY TO AFFECTED AREA TWICE A DAY, Disp: , Rfl: 0 .  ipratropium (ATROVENT) 0.06 % nasal spray, PLACE 2 SPRAYS INTO BOTH NOSTRILS 4 (FOUR) TIMES DAILY., Disp: 45 mL, Rfl: 2 .  ketoconazole (NIZORAL) 2 %  shampoo, APPLY TO AFFECTED AREA TWICE A WEEK AS DIRECTED, Disp: 120 mL, Rfl: 5 .  KLOR-CON M20 20 MEQ tablet, TAKE 1 TABLET (20 MEQ TOTAL) BY MOUTH DAILY., Disp: 90 tablet, Rfl: 1 .  Lactobacillus (FLORAJEN ACIDOPHILUS PO), Take 1 tablet by mouth daily., Disp: , Rfl:  .  latanoprost (XALATAN) 0.005 % ophthalmic solution, Place 1 drop into both eyes at bedtime. , Disp: , Rfl:  .  liver oil-zinc oxide (DESITIN) 40 % ointment, Apply 1 application topically as needed for irritation., Disp: , Rfl:  .  Loperamide HCl 1 MG/7.5ML SUSP, Take 1 mg by mouth., Disp: , Rfl:  .  loratadine (CLARITIN) 10 MG tablet, Take 10 mg by mouth daily., Disp: , Rfl:  .  meloxicam (MOBIC) 15 MG tablet, TAKE 1 TABLET BY MOUTH EVERY DAY, Disp: 30 tablet, Rfl: 0 .  metFORMIN (GLUCOPHAGE) 500 MG tablet, TAKE 1 TABLET BY MOUTH TWICE A DAY WITH A MEAL, Disp: 180 tablet, Rfl: 1 .  omeprazole (PRILOSEC) 20 MG capsule, TAKE 1 CAPSULE (20 MG TOTAL) BY MOUTH 2 (TWO) TIMES DAILY., Disp: 180 capsule, Rfl: 1 .  pravastatin (PRAVACHOL) 40 MG tablet, TAKE 1 TABLET BY MOUTH EVERY EVENING, Disp: 90 tablet, Rfl: 1 .  Propylene Glycol (SYSTANE BALANCE) 0.6 % SOLN, , Disp: , Rfl:  .  traMADol (ULTRAM) 50 MG tablet, Take 50 mg by mouth daily., Disp: , Rfl: 0 .  UNKNOWN TO PATIENT, Over the counter pill for her ears, takes 4 pills twice a day, unsure of name of meds, Disp: , Rfl:  .  Vitamins-Lipotropics ([employer] INNER EAR PLUS PO), Take by mouth., Disp: , Rfl:   EXAM:  Vitals:   11/17/16 1023  BP: 102/84  Pulse: 95  Temp: 97.6 F (36.4 C)    Body mass index is 36.03 kg/m.  GENERAL: vitals reviewed and listed above, alert, oriented, appears well hydrated and in no acute distress  HEENT: atraumatic, conjunttiva clear, no obvious abnormalities on inspection of external nose and ears  NECK: no obvious masses on inspection  LUNGS: clear to auscultation bilaterally, no wheezes, rales or rhonchi, good air movement  CV: HRRR, no peripheral  edema  ABD: BS+, soft, NTTP  MS: moves all extremities without noticeable abnormality  PSYCH: pleasant and cooperative, no obvious depression or anxiety  ASSESSMENT AND PLAN:  Discussed the following assessment and plan:  Constipation, unspecified constipation type  -advised she address any GI concerns with her gastroenterologist given recent procedure, seems symptoms  have resolved today   Patient Instructions  I am glad you are feeling better.  Call your gastroenterologist today to let them know what is going on.  Please call your gastroenterologist if any further concerns.   [name] R., DO

## 2016-11-17 NOTE — Patient Instructions (Signed)
I am glad you are feeling better.  Call your gastroenterologist today to let them know what is going on.  Please call your gastroenterologist if any further concerns.

## 2016-11-18 ENCOUNTER — Telehealth: Payer: Self-pay | Admitting: Certified Nurse Midwife

## 2016-11-18 NOTE — Telephone Encounter (Signed)
Spoke with patient. Patient states that she had surgery on Friday for polyps. States the Saturday following surgery she was seen at Urgent Care for constipation and was advised to start taking Colace 100 mg 4 times a day. Was seen with PCP 11/17/2016 for ongoing constipation and was advised contact her GI. Contacted GI and was advised to take Colace 100 mg last night and 1 this morning. States she had a normal, solid bowel movement this morning. Feels her bottom is slightly raw from constipation. Asking if she may use Balmex. Advised may use small amount of Balmex once a day for 3 days for irritation. Patient is agreeable. 20 minute phone call with patient.  Routing to provider for final review. Patient agreeable to disposition. Will close encounter.

## 2016-11-18 NOTE — Telephone Encounter (Signed)
Patient states her bottom is sore and wants to know if she should use the balmex.

## 2016-11-18 NOTE — Telephone Encounter (Signed)
Left message to call Kaitlyn at [phone]. 

## 2016-11-19 ENCOUNTER — Telehealth: Payer: Self-pay | Admitting: Certified Nurse Midwife

## 2016-11-19 NOTE — Telephone Encounter (Signed)
Patient want to speak with nurse about the cream she is using.

## 2016-11-20 NOTE — Telephone Encounter (Signed)
Spoke with patient. Patient states that she started using Balmex for rectal and thigh irritation. Has used a small amount once. Patient is asking if she can continue using this for 2 more days. Advised may apply a thin amount of Balmex once a day for 2 more days. Patient verbalizes understanding.   Routing to provider for final review. Patient agreeable to disposition. Will close encounter.

## 2016-11-21 ENCOUNTER — Telehealth: Payer: Self-pay | Admitting: Certified Nurse Midwife

## 2016-11-21 NOTE — Telephone Encounter (Signed)
Agree with recommendation

## 2016-11-21 NOTE — Telephone Encounter (Signed)
Spoke with patient. Advised Linda Thompson CNM does want her to stop taking Colace and increased water intake. May apply a warm wash cloth to her bottom for comfort. Can continue using Balmex. Needs to contact her provider who performed her surgery to discuss symptoms and if they want her to do anything additional. Patient is agreeable.  Routing to provider for final review. Patient agreeable to disposition.

## 2016-11-21 NOTE — Telephone Encounter (Signed)
Patient requesting a call back from Linda Thompson to schedule an appointment "for an emergency" with her bowels, she reports her bottom is sore.

## 2016-11-21 NOTE — Telephone Encounter (Signed)
Spoke with patient. Patient states that she woke up and had 1 episode of diarrhea. Reports she has been using Balmex for rectal irritation. States that her bottom is sore. Has been taking Colace daily. Advised to stop Colace and increase fluid intake. Advised may continue using Balmex. Patient is requesting an appointment. Advised will review with [name] for further recommendations and return call. Patient is agreeable.

## 2016-11-21 NOTE — Telephone Encounter (Signed)
Patient is calling again asking to talk with [facility] before 10:30am. Patient feels she may need to an appointment today.

## 2016-11-25 ENCOUNTER — Other Ambulatory Visit: Payer: Self-pay | Admitting: Podiatry

## 2016-12-01 ENCOUNTER — Telehealth: Payer: Self-pay | Admitting: Certified Nurse Midwife

## 2016-12-01 NOTE — Telephone Encounter (Signed)
Spoke with patient. Patient states that she has feels she is about to have breakdown of her skin in her groin area. Skin is red and irritated. Not applying any medication to the area. Advised may use a thin layer of Balmex once per day. Appointment scheduled for 12/04/2016 at 2:45 pm with [name]. Patient declines all other appointments due to prior appointments this week.  Routing to provider for final review. Patient agreeable to disposition. Will close encounter.

## 2016-12-01 NOTE — Telephone Encounter (Signed)
Patient is asking to talk with [facility].

## 2016-12-02 ENCOUNTER — Other Ambulatory Visit: Payer: Self-pay | Admitting: Family Medicine

## 2016-12-03 ENCOUNTER — Telehealth: Payer: Self-pay

## 2016-12-03 DIAGNOSIS — H401131 Primary open-angle glaucoma, bilateral, mild stage: Secondary | ICD-10-CM | POA: Diagnosis not present

## 2016-12-03 NOTE — Telephone Encounter (Deleted)
Incoming [facility] from [facility] for Ferrous sulfate 325 mg 1 po BID. At last office visit was asked to decrease to once a day and have repeat CBC. Last seen 08-2016. Please advise on refill and lab.Pt has been contacted and she has been taking iron only once a day as directed.  If she needs to continue iron supplements she does ask for a 90 days supply.

## 2016-12-03 NOTE — Telephone Encounter (Signed)
Incoming [facility]. Pt was contacted, she is only taking once a day as directed at last office visit. Last visit CBC was recommended in a few months. Pt states she wants a 90 days supply if needs to continue iron. Please advise on lab and refills.

## 2016-12-04 ENCOUNTER — Ambulatory Visit: Payer: Medicare Other | Admitting: Certified Nurse Midwife

## 2016-12-04 MED ORDER — FERROUS SULFATE 325 (65 FE) MG PO TABS: 325.0000 mg | ORAL_TABLET | Freq: Every day | ORAL | 3 refills | Status: DC

## 2016-12-04 NOTE — Telephone Encounter (Signed)
Pt has been notified and aware.  

## 2016-12-04 NOTE — Telephone Encounter (Signed)
Thanks for the update Linda Thompson. She's had a few CBCs this year, her anemia has resolved. She had an EGD with Dr. 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 I think it is okay to refill iron at once daily for now. Moving forward if her gastric polyps have been resected she may be able to stop iron

## 2016-12-05 ENCOUNTER — Encounter: Payer: Self-pay | Admitting: Certified Nurse Midwife

## 2016-12-05 ENCOUNTER — Other Ambulatory Visit: Payer: Self-pay

## 2016-12-05 ENCOUNTER — Ambulatory Visit (INDEPENDENT_AMBULATORY_CARE_PROVIDER_SITE_OTHER): Payer: Medicare Other | Admitting: Certified Nurse Midwife

## 2016-12-05 VITALS — BP 110/70 | HR 70 | Resp 16 | Ht 62.0 in | Wt 198.0 lb

## 2016-12-05 DIAGNOSIS — B372 Candidiasis of skin and nail: Secondary | ICD-10-CM | POA: Diagnosis not present

## 2016-12-05 MED ORDER — FLUCONAZOLE 150 MG PO TABS: ORAL_TABLET | ORAL | 0 refills | Status: DC

## 2016-12-05 NOTE — Telephone Encounter (Signed)
Rx denial sent to the pharmacy with a note stating to purchase this over the counter.

## 2016-12-05 NOTE — Telephone Encounter (Signed)
Okay to fill metformin. Loperamide is over-the-counter and I do not usually prescribe that medication.

## 2016-12-05 NOTE — Patient Instructions (Signed)
Use one pill today and repeat one in one week if needed

## 2016-12-05 NOTE — Progress Notes (Signed)
61 y.o. Single Caucasian female [name] here with complaint of symptoms of itching in groin area only, no discharge Has been using Balmex on skin to prevent moisture, some itching in that area.. Onset of symptoms 4 days ago. Denies new personal products. Patient recent surgery and some constipation, but all resolved now. Has been wearing loose clothing as discussed previously and this has helped. Still has home health nurse once weekly. Had friend go with her for surgery. No vaginal itching or discharge. Will be having thanksgiving with friend she hopes. No other health issues today.   ROS Pertinent to above  O:Healthy female WDWN Affect: normal, orientation x 3  Exam: Skin warm and dry Abdomen:soft, non tender  Inguinal Lymph node: no enlargement or tenderness Pelvic exam:Groin area bilateral slightly red with balmex residue noted. No weeping. Wet prep of skin taken. Non tender, no odor  External genital: normal female, no excoriations noted, no scaling, no exudate.  No pelvic exam needed.  Wet Prep results: Positive for yeast  KOH,Saline    A:Normal external genital and groin exam Yeast dermatitis chronic, but skin appearance healthy   P:Discussed findings of yeast dermatitis as before, but very scant appearance. Discussed she is doing a good job with cleaning and staying dry. Will treat with one Diflucan and she can repeat one in one week if needed. Continue Balmex use if she feels the area is moist. Questions addressed. Printed instructions given. and etiology.   Rx Diflucan 150 mg see order with instructions.  Rv prn

## 2016-12-15 ENCOUNTER — Telehealth: Payer: Self-pay | Admitting: Certified Nurse Midwife

## 2016-12-15 NOTE — Telephone Encounter (Signed)
Patient called to let us know that she took the [place] on Friday and if not better she will take the other one this Friday.  Also wants to let Jackelyn Poling know that she is using balmex and will use it for 3 days.  Says if she is not better in 3 days she will call back.

## 2016-12-15 NOTE — Telephone Encounter (Signed)
Routing to Deborah Leonard CNM as FYI. 

## 2016-12-16 ENCOUNTER — Telehealth: Payer: Self-pay | Admitting: *Deleted

## 2016-12-16 MED ORDER — MELOXICAM 15 MG PO TABS: 15.0000 mg | ORAL_TABLET | Freq: Every day | ORAL | 0 refills | Status: DC

## 2016-12-16 NOTE — Telephone Encounter (Signed)
Refill request Meloxicam. Dr. Randie Heinz to refill to get pt to the 3 month follow up, or she would also come in sooner as needed.

## 2016-12-17 ENCOUNTER — Other Ambulatory Visit: Payer: Self-pay | Admitting: Family Medicine

## 2016-12-18 NOTE — Progress Notes (Deleted)
Subjective:   Linda Thompson is a 61 y.o. female who presents for Medicare Annual (Subsequent) preventive examination.     The Patient was informed that the wellness visit is to identify future health risk and educate and initiate measures that can reduce risk for increased disease through the lifespan.    Annual Wellness Assessment  Reports health as   Preventive Screening -Counseling & Management  Medicare Annual Preventive Care Visit - Subsequent Last OV for medical mgmt; 07/2016 diabetes Mod d/a seeing psychiatrist   Health Maintenance Due  Topic Date Due  . HEMOGLOBIN A1C  11/18/2016   A1c to be drawn today Mammogram 09/2016 dexa < 65 Colonoscopy 03/2015   Sees Dr. Maudie Mercury at [facility] as poor, fair, good or great?   VS reviewed;   Diet   BMI  Exercise  Eye exam 04/2016 no DM retinopathy  Dental  Stressors:   Sleep patterns:   Pain?    Cardiac Risk Factors Addressed Family hx positive for DM, stroke and HTN Hyperlipidemia- ratio 5; HDL 37; LDL 95; trig 273 Diabetes  BS 98 and A1c on 04/2016 was 6.4  Obesity  Advanced Directives  Patient Care Team: [name], DO as PCP - General (Family Medicine)      Objective:     Vitals: LMP 01/21/1992 (Approximate)   There is no height or weight on file to calculate BMI.   Tobacco Social History   Tobacco Use  Smoking Status Never Smoker  Smokeless Tobacco Never Used     Counseling given: Not Answered never smoked   Past Medical History:  Diagnosis Date  . Allergic rhinitis   . Allergy   . Anemia   . Chronic low back pain   . DDD (degenerative disc disease), lumbar   . GERD (gastroesophageal reflux disease)    w/ hx diaphragmatic hernia  . Glaucoma   . GLAUCOMA NOS 03/11/2006   Qualifier: Diagnosis of  By: Diona Browner MD, Amy    . Heart murmur   . Hx of fracture    R arm, foot  . Hyperlipemia   . Hypertension   . IBS (irritable bowel syndrome)   . Low back pain 11/22/2012    . Meningioma ([place])    ant falx, stable on prior imaging  . Moderate intellectual disabilities   . Murmur   . Obesity   . Osteoarthrosis, unspecified whether generalized or localized, ankle and foot   . Sarcoidosis    difuse bony lesions and LDA, dx by biopsy in 2016  . Transaminitis   . Type II or unspecified type diabetes mellitus without mention of complication, not stated as uncontrolled    Past Surgical History:  Procedure Laterality Date  . CATARACT EXTRACTION Bilateral    Dr Katy Fitch  . COLONOSCOPY  04/15/2011  . ELBOW SURGERY     right elbow  . LIPOMA EXCISION Left [id number]   posterior left axilla  . TOTAL ABDOMINAL HYSTERECTOMY  03/05/1992   leiomyoma and cellular leiomyoma   Family History  Problem Relation Age of Onset  . Diabetes Mother   . Stroke Mother   . Hypertension Mother   . Stroke Father   . Diabetes Maternal Aunt   . Heart disease Maternal Aunt         (had pacemaker)  . Diabetes Maternal Grandmother   . Stroke Maternal Grandmother   . Hypertension Maternal Grandmother   . Colon cancer Neg Hx    Social History  Substance and Sexual Activity  Sexual Activity No  . Birth control/protection: Surgical   Comment: Hyst--TAH--unsure if has ovaries    Outpatient Encounter Medications as of 12/19/2016  Medication Sig  . acetaminophen (TYLENOL) 500 MG tablet Take 500 mg by mouth as needed (left ear pain). Reported on 04/18/2015  . clotrimazole (LOTRIMIN) 1 % cream APPLY TWICE A DAY FOR 7 DAYS AS DIRECTED  . diltiazem (CARDIZEM CD) 240 MG 24 hr capsule TAKE ONE CAPSULE BY MOUTH EVERY DAY  . ferrous sulfate 325 (65 FE) MG tablet Take 1 tablet (325 mg total) daily with breakfast by mouth.  . fluconazole (DIFLUCAN) 150 MG tablet Take one tablet today. Repeat one in one week if  Needed.  . fluticasone (FLONASE) 50 MCG/ACT nasal spray Place into both nostrils daily.  . hydrochlorothiazide (MICROZIDE) 12.5 MG capsule TAKE 1 CAPSULE BY MOUTH ONCE DAILY  .  hydrocortisone (ANUSOL-HC) 25 MG suppository UNWRAP AND INSERT 1 SUPPOSITORY IN THE RECTUM TWICE A DAY  . hydrocortisone 2.5 % ointment APPLY TO AFFECTED AREA TWICE A DAY  . ipratropium (ATROVENT) 0.06 % nasal spray PLACE 2 SPRAYS INTO BOTH NOSTRILS 4 (FOUR) TIMES DAILY.  Marland Kitchen ketoconazole (NIZORAL) 2 % shampoo APPLY TO AFFECTED AREA TWICE A WEEK AS DIRECTED  . KLOR-CON M20 20 MEQ tablet TAKE 1 TABLET (20 MEQ TOTAL) BY MOUTH DAILY.  . Lactobacillus (FLORAJEN ACIDOPHILUS PO) Take 1 tablet by mouth daily.  Marland Kitchen latanoprost (XALATAN) 0.005 % ophthalmic solution Place 1 drop into both eyes at bedtime.   Marland Kitchen liver oil-zinc oxide (DESITIN) 40 % ointment Apply 1 application topically as needed for irritation.  . Loperamide HCl 1 MG/7.5ML SUSP Take 1 mg by mouth.  . loratadine (CLARITIN) 10 MG tablet Take 10 mg by mouth daily.  . meloxicam (MOBIC) 15 MG tablet TAKE 1 TABLET BY MOUTH EVERY DAY  . meloxicam (MOBIC) 15 MG tablet Take 1 tablet (15 mg total) by mouth daily.  . metFORMIN (GLUCOPHAGE) 500 MG tablet TAKE 1 TABLET BY MOUTH TWICE A DAY WITH A MEAL  . omeprazole (PRILOSEC) 20 MG capsule TAKE 1 CAPSULE (20 MG TOTAL) BY MOUTH 2 (TWO) TIMES DAILY.  . pravastatin (PRAVACHOL) 40 MG tablet TAKE 1 TABLET BY MOUTH EVERY EVENING  . Propylene Glycol (SYSTANE BALANCE) 0.6 % SOLN   . traMADol (ULTRAM) 50 MG tablet Take 50 mg by mouth daily.  Marland Kitchen UNKNOWN TO PATIENT Over the counter pill for her ears, takes 4 pills twice a day, unsure of name of meds  . Vitamins-Lipotropics ([facility] INNER EAR PLUS PO) Take by mouth.   No facility-administered encounter medications on file as of 12/19/2016.     Activities of Daily Living In your present state of health, do you have any difficulty performing the following activities: 02/21/2016 01/25/2016  Hearing? N N  Vision? N N  Difficulty concentrating or making decisions? N N  Walking or climbing stairs? N Y  Comment - -  Dressing or bathing? N N  Doing errands, shopping? N Y    Comment - -  Conservation officer, nature and eating ? N -  Using the Toilet? N -  In the past six months, have you accidently leaked urine? N -  Do you have problems with loss of bowel control? N -  Managing your Medications? N -  Managing your Finances? N -  Housekeeping or managing your Housekeeping? N -  Some recent data might be hidden    Patient Care Team: [name], DO as  PCP - General (Family Medicine)    Assessment:     Exercise Activities and Dietary recommendations    Goals    None     Fall Risk Fall Risk  03/21/2016 02/21/2016 02/21/2016 01/28/2016 01/25/2016  Falls in the past year? Yes Yes Yes No (No Data)  Comment Falls: (2)  12/11/15 and 02/12/16 last fall dates:  12/11/15 and 02/12/16 last fall date 12/11/15 - last fall date 12/11/15  Number falls in past yr: 2 or more 2 or more 1 1 1   Injury with Fall? Yes Yes Yes Yes Yes  Risk Factor Category  High Fall Risk High Fall Risk High Fall Risk High Fall Risk High Fall Risk  Risk for fall due to : History of fall(s);Impaired balance/gait History of fall(s);Impaired balance/gait History of fall(s);Impaired mobility Impaired balance/gait;Impaired mobility;History of fall(s) History of fall(s);Impaired balance/gait;Impaired mobility  Follow up Falls evaluation completed;Education provided;Falls prevention discussed Education provided;Falls evaluation completed;Falls prevention discussed Falls evaluation completed;Falls prevention discussed;Education provided Falls evaluation completed;Education provided Falls evaluation completed;Education provided;Falls prevention discussed   Depression Screen PHQ 2/9 Scores 03/21/2016 02/21/2016 01/30/2016 01/28/2016  PHQ - 2 Score 0 0 0 0     Cognitive Function MMSE - Mini Mental State Exam 12/11/2015  Not completed: (No Data)        Immunization History  Administered Date(s) Administered  . Influenza Split 09/19/2010  . Influenza Whole 11/12/2004, 10/21/2006, 10/18/2007, 10/19/2008, 10/25/2009  .  Influenza,inj,Quad PF,6+ Mos 09/17/2012, 09/19/2013, 09/13/2014  . Influenza-Unspecified 08/31/2015, 09/06/2016  . Pneumococcal Polysaccharide-23 03/10/2002, 02/08/2009  . Td 03/10/2002, 08/08/2014  . Zoster 08/08/2014   Screening Tests Health Maintenance  Topic Date Due  . HEMOGLOBIN A1C  11/18/2016  . PAP SMEAR  01/16/2017  . FOOT EXAM  02/17/2017  . OPHTHALMOLOGY EXAM  05/14/2017  . URINE MICROALBUMIN  08/18/2017  . MAMMOGRAM  09/24/2017  . COLONOSCOPY  04/18/2018  . TETANUS/TDAP  08/07/2024  . PNEUMOCOCCAL POLYSACCHARIDE VACCINE  Completed  . INFLUENZA VACCINE  Completed  . Hepatitis C Screening  Completed  . HIV Screening  Completed      Plan:    PCP Notes ***  Health Maintenance ***  Abnormal Screens  ***  Referrals  ***  Patient concerns; ***  Nurse Concerns; ***  Next PCP apt ***        I have personally reviewed and noted the following in the patient's chart:   . Medical and social history . Use of alcohol, tobacco or illicit drugs  . Current medications and supplements . Functional ability and status . Nutritional status . Physical activity . Advanced directives . List of other physicians . Hospitalizations, surgeries, and ER visits in previous 12 months . Vitals . Screenings to include cognitive, depression, and falls . Referrals and appointments  In addition, I have reviewed and discussed with patient certain preventive protocols, quality metrics, and best practice recommendations. A written personalized care plan for preventive services as well as general preventive health recommendations were provided to patient.     Wynetta Fines, RN  12/18/2016

## 2016-12-18 NOTE — Progress Notes (Signed)
HPI:  Linda Thompson is a pleasant 61 y.o. here for follow up. Chronic medical problems summarized below were reviewed for changes and stability and were updated as needed below. These issues and their treatment remain stable for the most part.  With no complaints today.  Reports she feels great.  She did not fast today. Denies CP, SOB, DOE, treatment intolerance or new symptoms. She saw Linda Thompson for her annual wellness visit.  Please see her notes for further details.  Diabetes: -meds: metformin -allergy to acei  Obesity, hyperlipidemia, hypertension: -meds: diltiazem, hctz, pravastatin  Moderate intellectual disabilities/Anxiety: -seeing psychiatrist -high level of anxiety about health with frequent visits  -HCPOA: Linda Thompson  Allergic rhinitis: -uses flonase and claritin as needed  GERD/irritable bowel syndrome: -meds: low dose PPI  Sarcoidosis: -sees pulmonology -Bx 10/03/14 [place]  - ACE level reported to be 98 ? 01/2015  - cxr 04/24/2015 wnl   Osteoarthritis, multiple sites: -sees Dr. Lorin Thompson -uses tylenol prn  Meningioma: -Repeat MR from 2017 - radiology feels not a meningioma, but rather "Thickening of the anterior falx appears to be related to degenerative ossification and not meningioma." -HCPOA opted not to do repeat imaging with CT for the possible hemangioma and declined further evaluation of this.  ROS: See pertinent positives and negatives per HPI.  Past Medical History:  Diagnosis Date  . Allergic rhinitis   . Allergy   . Anemia   . Chronic low back pain   . DDD (degenerative disc disease), lumbar   . GERD (gastroesophageal reflux disease)    w/ hx diaphragmatic hernia  . Glaucoma   . GLAUCOMA NOS 03/11/2006   Qualifier: Diagnosis of  By: Diona Browner MD, Linda    . Heart murmur   . Hx of fracture    R arm, foot  . Hyperlipemia   . Hypertension   . IBS (irritable bowel syndrome)   . Low back pain 11/22/2012  . Meningioma ([place])    ant falx,  stable on prior imaging  . Moderate intellectual disabilities   . Murmur   . Obesity   . Osteoarthrosis, unspecified whether generalized or localized, ankle and foot   . Sarcoidosis    difuse bony lesions and LDA, dx by biopsy in 2016  . Transaminitis   . Type II or unspecified type diabetes mellitus without mention of complication, not stated as uncontrolled     Past Surgical History:  Procedure Laterality Date  . CATARACT EXTRACTION Bilateral    Dr Linda Thompson  . COLONOSCOPY  04/15/2011  . ELBOW SURGERY     right elbow  . LIPOMA EXCISION Left [id number]   posterior left axilla  . TOTAL ABDOMINAL HYSTERECTOMY  03/05/1992   leiomyoma and cellular leiomyoma    Family History  Problem Relation Age of Onset  . Diabetes Mother   . Stroke Mother   . Hypertension Mother   . Stroke Father   . Diabetes Maternal Aunt   . Heart disease Maternal Aunt         (had pacemaker)  . Diabetes Maternal Grandmother   . Stroke Maternal Grandmother   . Hypertension Maternal Grandmother   . Colon cancer Neg Hx     Social History   Socioeconomic History  . Marital status: Single    Spouse name: Not on file  . Number of children: 0  . Years of education: Not on file  . Highest education level: Not on file  Social Needs  . Financial resource strain: Not  on file  . Food insecurity - worry: Not on file  . Food insecurity - inability: Not on file  . Transportation needs - medical: Not on file  . Transportation needs - non-medical: Not on file  Occupational History  . Occupation: Disabled (from arm fracture)  Tobacco Use  . Smoking status: Never Smoker  . Smokeless tobacco: Never Used  Substance and Sexual Activity  . Alcohol use: No    Alcohol/week: 0.0 oz  . Drug use: No  . Sexual activity: No    Birth control/protection: Surgical    Comment: Hyst--TAH--unsure if has ovaries  Other Topics Concern  . Not on file  Social History Narrative         Currently-disability for arm fracture       Mental Retardation, but is able to drive to places she is comfortable with and prepare her own meals and manage most of her own affairs.      Linda Thompson (neighbor) is POA/HCPOA      Reports she gets regular exercise and tries to eat healthy     Current Outpatient Medications:  .  acetaminophen (TYLENOL) 500 MG tablet, Take 500 mg by mouth as needed (left ear pain). Reported on 04/18/2015, Disp: , Rfl:  .  clotrimazole (LOTRIMIN) 1 % cream, APPLY TWICE A DAY FOR 7 DAYS AS DIRECTED, Disp: , Rfl: 0 .  diltiazem (CARDIZEM CD) 240 MG 24 hr capsule, TAKE ONE CAPSULE BY MOUTH EVERY DAY, Disp: 90 capsule, Rfl: 2 .  ferrous sulfate 325 (65 FE) MG tablet, Take 1 tablet (325 mg total) daily with breakfast by mouth., Disp: 90 tablet, Rfl: 3 .  fluconazole (DIFLUCAN) 150 MG tablet, Take one tablet today. Repeat one in one week if  Needed., Disp: 2 tablet, Rfl: 0 .  fluticasone (FLONASE) 50 MCG/ACT nasal spray, Place into both nostrils daily., Disp: , Rfl:  .  hydrochlorothiazide (MICROZIDE) 12.5 MG capsule, TAKE 1 CAPSULE BY MOUTH ONCE DAILY, Disp: 90 capsule, Rfl: 1 .  hydrocortisone (ANUSOL-HC) 25 MG suppository, UNWRAP AND INSERT 1 SUPPOSITORY IN THE RECTUM TWICE A DAY, Disp: , Rfl: 1 .  hydrocortisone 2.5 % ointment, APPLY TO AFFECTED AREA TWICE A DAY, Disp: , Rfl: 0 .  ipratropium (ATROVENT) 0.06 % nasal spray, PLACE 2 SPRAYS INTO BOTH NOSTRILS 4 (FOUR) TIMES DAILY., Disp: 45 mL, Rfl: 2 .  ketoconazole (NIZORAL) 2 % shampoo, APPLY TO AFFECTED AREA TWICE A WEEK AS DIRECTED, Disp: 120 mL, Rfl: 5 .  KLOR-CON M20 20 MEQ tablet, TAKE 1 TABLET (20 MEQ TOTAL) BY MOUTH DAILY., Disp: 90 tablet, Rfl: 1 .  Lactobacillus (FLORAJEN ACIDOPHILUS PO), Take 1 tablet by mouth daily., Disp: , Rfl:  .  latanoprost (XALATAN) 0.005 % ophthalmic solution, Place 1 drop into both eyes at bedtime. , Disp: , Rfl:  .  liver oil-zinc oxide (DESITIN) 40 % ointment, Apply 1 application topically as needed for irritation., Disp:  , Rfl:  .  Loperamide HCl 1 MG/7.5ML SUSP, Take 1 mg by mouth., Disp: , Rfl:  .  loratadine (CLARITIN) 10 MG tablet, Take 10 mg by mouth daily., Disp: , Rfl:  .  meloxicam (MOBIC) 15 MG tablet, Take 1 tablet (15 mg total) by mouth daily., Disp: 90 tablet, Rfl: 0 .  metFORMIN (GLUCOPHAGE) 500 MG tablet, TAKE 1 TABLET BY MOUTH TWICE A DAY WITH A MEAL, Disp: 180 tablet, Rfl: 1 .  omeprazole (PRILOSEC) 20 MG capsule, TAKE 1 CAPSULE (20 MG TOTAL) BY MOUTH 2 (TWO)  TIMES DAILY., Disp: 180 capsule, Rfl: 1 .  pravastatin (PRAVACHOL) 40 MG tablet, TAKE 1 TABLET BY MOUTH EVERY EVENING, Disp: 90 tablet, Rfl: 1 .  Propylene Glycol (SYSTANE BALANCE) 0.6 % SOLN, , Disp: , Rfl:  .  traMADol (ULTRAM) 50 MG tablet, Take 50 mg by mouth daily., Disp: , Rfl: 0 .  UNKNOWN TO PATIENT, Over the counter pill for her ears, takes 4 pills twice a day, unsure of name of meds, Disp: , Rfl:  .  Vitamins-Lipotropics ([employer] INNER EAR PLUS PO), Take by mouth., Disp: , Rfl:   EXAM:  Vitals:   12/19/16 0947  BP: (!) 142/92  Pulse: 87  SpO2: 96%    Body mass index is 35.85 kg/m.  GENERAL: vitals reviewed and listed above, alert, oriented, appears well hydrated and in no acute distress  HEENT: atraumatic, conjunttiva clear, no obvious abnormalities on inspection of external nose and ears  NECK: no obvious masses on inspection  LUNGS: clear to auscultation bilaterally, no wheezes, rales or rhonchi, good air movement  CV: HRRR, no peripheral edema  MS: moves all extremities without noticeable abnormality  PSYCH: pleasant and cooperative, no obvious depression or anxiety  ASSESSMENT AND PLAN:  Discussed the following assessment and plan:  Type 2 diabetes mellitus without complication, without long-term current use of insulin (HCC) - Plan: Hemoglobin A1c  Hyperlipidemia, unspecified hyperlipidemia type  Essential hypertension - Plan: Basic metabolic panel, CBC  Obesity, morbid ([place])  -Labs today -I will  review annual wellness visit notes -Lifestyle recommendations -Follow-up 3-4 months -Patient advised to return or notify a doctor immediately if symptoms worsen or persist or new concerns arise.  Patient Instructions   BEFORE YOU LEAVE: -labs -follow up: 3-4 months   Ms. Amedeo Plenty , Thank you for taking time to come for your Medicare Wellness Visit. I appreciate your ongoing commitment to your health goals. Please review the following plan we discussed and let me know if I can assist you in the future.   The Centers for Disease Control are now recommending 2 pneumonia vaccinations after 81. The first is the Prevnar 13. This helps to boost your immunity to community acquired pneumonia as well as some protection from bacterial pneumonia  The 2nd is the pneumovax 23, which offers more broad protection!  Please consider taking these as this is your best protection against pneumonia. You will have the [place] at 78 and at 61 yo, you will have the PSV 23 or pneumonvax  Deaf & Hard of Hearing Division Services - can assist with hearing aid x 1  No reviews  [place]  [place] [address]  [phone]  You are due a pap; It is scheduled for March; Dr. Hollice Espy  Prevention of falls: Remove rugs or any tripping hazards in the home Use Non slip mats in bathtubs and showers Placing grab bars next to the toilet and or shower Placing handrails on both sides of the stair way Adding extra lighting in the home.   History of fall have been outside   Personal safety issues reviewed:  1. Consider starting a community watch program per [facility] 2.  Changes batteries is smoke detector and/or carbon monoxide detector  3.  If you have firearms; keep them in a safe place 4.  Wear protection when in the sun; Always wear sunscreen or a hat; It is good to have your doctor check your skin annually or review any new areas of concern 5. Driving  safety; Keep in the right lane; stay  3 car lengths behind the car in front of you on the highway; look 3 times prior to pulling out; carry your cell phone everywhere you go!    Learn about the Yellow Dot program:  The program allows first responders at your emergency to have access to who your physician is, as well as your medications and medical conditions.  Citizens requesting the Yellow Dot Packages should contact Master Corporal Nunzio Cobbs at the [facility] [phone] for the first week of the program and beginning the week after Easter citizens should contact their Scientist, physiological.  Shingrix is a vaccine for the prevention of Shingles in Adults 50 and older.  If you are on Medicare, you can request a prescription from your doctor to be filled at a pharmacy.  Please check with your benefits regarding applicable copays or out of pocket expenses.  The Shingrix is given in 2 vaccines approx 8 weeks apart. You must receive the 2nd dose prior to 6 months from receipt of the first.     These are the goals we discussed:This is a list of community exercise available to help people get stronger and decrease the risk for falls. You can also ask your nurse to assist you for 20 minutes to walk in the neighborhood  Goals    . Exercise 150 min/wk Moderate Activity     Manufacturing engineer of Services Cost  A Matter of Balance Class locations vary. Call [place] on Aging for more information.  http://dawson-may.[email or web address]/ [phone] 8-Session program addressing the fear of falling and increasing activity levels of older adults Free to minimal cost  A.C.T. By [employer] [address] [place], [place] [address].  BetaBlues.dk [phone]  Personal training, gym, classes including Silver Sneakers* and ACTion for Aging Adults Fee-based  A.H.O.Y. (Add Health to [place]) Airs on Time [employer] 13, M-F at [place]: [employer],  [place] [facility] [place],  [place], [place] [facility], [address] [facility], [place], [place], [place] [address]  [place] Location: [place] [employer] [place]      [phone]  [phone]  [phone]  [phone]  [phone]  [phone]  [phone]  [phone]  [phone]  [phone]    [phone] A total-body conditioning class for adults 2 and older; designed to increase muscular strength, endurance, range of movement, flexibility, balance, agility and coordination Free  [facility] [place], [place] [address] [place]      [address]      [phone]      Pilate's class for individualsreturning to exercise after an injury, before or after surgery or for individuals with complex musculoskeletal issues; designed to improve strength, balance , flexibility      $15/class  [place] [address] [place], [place] [address] [email or web address] [place] classes for beginners to advanced [place], [place] [address] Seniorcenter'[email or web address]  [email or web address] [place] Exercises Free, ages 80 and older; Ages 51-59 fee based  Marvia Pickles, [facility] [address] [place], [place] [address] [email or web address] .gov [phone]  A.H.O.Y. Tai Chi Fee-based Donation based or  free  [place] Class locations vary.  Call or email Angela Burke or view website for more information. [email or web address] .[email or web address] GainPain.com.[email or web address] [phone] Ongoing classes at local YMCAs and gyms Fee-based  Silver Sneakers A.C.T. By [place] [employer]: [place] [phone]  [phone] Classes designed for older adults who want to improve their strength, flexibility, balance and endurance.   Silver sneakers is covered by some insurance plans and includes a fitness center membership at participating locations. Find out more by calling [phone] or visiting [email or web address] Covered by some insurance plans  [facility] [place] [phone] A.H.O.Y., fitness room, personal training, fitness classes for injury prevention, strength, balance, flexibility, water fitness classes Ages 55+: $56 for 6 months; Ages 40-54: $59 for 6 months  Tai Chi for Everybody [facility] [address] [place], [place] [address] Taichiforeverybody'[email or web address] .Patsi Sears [phone] Tai Chi classes for beginners to advanced; geared for seniors Donation Based      [employer] (Helpling Others Participate in Exercise     [place] Gambler. Rosana Hoes, PhD, [place] [email or web address] .[email or web address] [place]     [phone]     A comprehensive fitness program for adults.  The program paris senior-level undergraduates Kinesiology students with adults who desire to learn how to exercise safely.  Includes a structural exercise class focusing on functional fitnesss     $100/semester in fall and spring; $75 in summer (no  trainers)    *Silver Sneakers is covered by some Personal assistant and includes a  Radio producer at participating locations.  Find out more by calling [phone] or visiting [email or web address]  For additional health and human services resources for senior adults, please contact SeniorLine at [phone] in [place] and [place] at [phone] in all other areas.       This is a list of the screening recommended for you and due dates:  Health Maintenance  Topic Date Due  . Hemoglobin A1C  11/18/2016  . Pap Smear  01/16/2017  . Complete foot exam   02/17/2017  . Eye exam for diabetics  05/14/2017  . Urine Protein Check  08/18/2017  . Mammogram  09/24/2017  . Colon Cancer Screening  04/18/2018  . Tetanus Vaccine  08/07/2024  . Pneumococcal vaccine  Completed  . Flu Shot  Completed  .  Hepatitis C: One time screening is recommended by Center for Disease Control  (CDC) for  adults born from [birth year] through 1966.   Completed  . HIV Screening  Completed   Health Maintenance, Female Adopting a healthy lifestyle and getting preventive care can go a long way to promote health and wellness. Talk with your health care provider about what schedule of regular examinations is right for you. This is a good chance for you to check in with your provider about disease prevention and staying healthy. In between checkups, there are plenty of things you can do on your own. Experts have done a lot of research about which lifestyle changes and preventive measures are most likely  to keep you healthy. Ask your health care provider for more information. Weight and diet Eat a healthy diet  Be sure to include plenty of vegetables, fruits, low-fat dairy products, and lean protein.  Do not eat a lot of foods high in solid fats, added sugars, or salt.  Get regular exercise. This is one of the most important things you can do for your health. ? Most adults should exercise for at least 150 minutes each  week. The exercise should increase your heart rate and make you sweat (moderate-intensity exercise). ? Most adults should also do strengthening exercises at least twice a week. This is in addition to the moderate-intensity exercise.  Maintain a healthy weight  Body mass index (BMI) is a measurement that can be used to identify possible weight problems. It estimates body fat based on height and weight. Your health care provider can help determine your BMI and help you achieve or maintain a healthy weight.  For females 46 years of age and older: ? A BMI below 18.5 is considered underweight. ? A BMI of 18.5 to 24.9 is normal. ? A BMI of 25 to 29.9 is considered overweight. ? A BMI of 30 and above is considered obese.  Watch levels of cholesterol and blood lipids  You should start having your blood tested for lipids and cholesterol at 62 years of age, then have this test every 5 years.  You may need to have your cholesterol levels checked more often if: ? Your lipid or cholesterol levels are high. ? You are older than 62 years of age. ? You are at high risk for heart disease.  Cancer screening Lung Cancer  Lung cancer screening is recommended for adults 58-65 years old who are at high risk for lung cancer because of a history of smoking.  A yearly low-dose CT scan of the lungs is recommended for people who: ? Currently smoke. ? Have quit within the past 15 years. ? Have at least a 30-pack-year history of smoking. A pack year is smoking an average of one pack of cigarettes a day for 1 year.  Yearly screening should continue until it has been 15 years since you quit.  Yearly screening should stop if you develop a health problem that would prevent you from having lung cancer treatment.  Breast Cancer  Practice breast self-awareness. This means understanding how your breasts normally appear and feel.  It also means doing regular breast self-exams. Let your health care provider know  about any changes, no matter how small.  If you are in your 20s or 30s, you should have a clinical breast exam (CBE) by a health care provider every 1-3 years as part of a regular health exam.  If you are 66 or older, have a CBE every year. Also consider having a breast X-ray (mammogram) every year.  If you have a family history of breast cancer, talk to your health care provider about genetic screening.  If you are at high risk for breast cancer, talk to your health care provider about having an MRI and a mammogram every year.  Breast cancer gene (BRCA) assessment is recommended for women who have family members with BRCA-related cancers. BRCA-related cancers include: ? Breast. ? Ovarian. ? Tubal. ? Peritoneal cancers.  Results of the assessment will determine the need for genetic counseling and BRCA1 and BRCA2 testing.  Cervical Cancer Your health care provider may recommend that you be screened regularly for cancer of the pelvic organs (ovaries, uterus,  and vagina). This screening involves a pelvic examination, including checking for microscopic changes to the surface of your cervix (Pap test). You may be encouraged to have this screening done every 3 years, beginning at age 33.  For women ages 49-65, health care providers may recommend pelvic exams and Pap testing every 3 years, or they may recommend the Pap and pelvic exam, combined with testing for human papilloma virus (HPV), every 5 years. Some types of HPV increase your risk of cervical cancer. Testing for HPV may also be done on women of any age with unclear Pap test results.  Other health care providers may not recommend any screening for nonpregnant women who are considered low risk for pelvic cancer and who do not have symptoms. Ask your health care provider if a screening pelvic exam is right for you.  If you have had past treatment for cervical cancer or a condition that could lead to cancer, you need Pap tests and screening  for cancer for at least 20 years after your treatment. If Pap tests have been discontinued, your risk factors (such as having a new sexual partner) need to be reassessed to determine if screening should resume. Some women have medical problems that increase the chance of getting cervical cancer. In these cases, your health care provider may recommend more frequent screening and Pap tests.  Colorectal Cancer  This type of cancer can be detected and often prevented.  Routine colorectal cancer screening usually begins at 62 years of age and continues through 62 years of age.  Your health care provider may recommend screening at an earlier age if you have risk factors for colon cancer.  Your health care provider may also recommend using home test kits to check for hidden blood in the stool.  A small camera at the end of a tube can be used to examine your colon directly (sigmoidoscopy or colonoscopy). This is done to check for the earliest forms of colorectal cancer.  Routine screening usually begins at age 40.  Direct examination of the colon should be repeated every 5-10 years through 62 years of age. However, you may need to be screened more often if early forms of precancerous polyps or small growths are found.  Skin Cancer  Check your skin from head to toe regularly.  Tell your health care provider about any new moles or changes in moles, especially if there is a change in a mole's shape or color.  Also tell your health care provider if you have a mole that is larger than the size of a pencil eraser.  Always use sunscreen. Apply sunscreen liberally and repeatedly throughout the day.  Protect yourself by wearing long sleeves, pants, a wide-brimmed hat, and sunglasses whenever you are outside.  Heart disease, diabetes, and high blood pressure  High blood pressure causes heart disease and increases the risk of stroke. High blood pressure is more likely to develop in: ? People who have  blood pressure in the high end of the normal range (130-139/85-89 mm Hg). ? People who are overweight or obese. ? People who are African American.  If you are 57-55 years of age, have your blood pressure checked every 3-5 years. If you are 2 years of age or older, have your blood pressure checked every year. You should have your blood pressure measured twice-once when you are at a hospital or clinic, and once when you are not at a hospital or clinic. Record the average of the two measurements. To  check your blood pressure when you are not at a hospital or clinic, you can use: ? An automated blood pressure machine at a pharmacy. ? A home blood pressure monitor.  If you are between 58 years and 29 years old, ask your health care provider if you should take aspirin to prevent strokes.  Have regular diabetes screenings. This involves taking a blood sample to check your fasting blood sugar level. ? If you are at a normal weight and have a low risk for diabetes, have this test once every three years after 62 years of age. ? If you are overweight and have a high risk for diabetes, consider being tested at a younger age or more often. Preventing infection Hepatitis B  If you have a higher risk for hepatitis B, you should be screened for this virus. You are considered at high risk for hepatitis B if: ? You were born in a country where hepatitis B is common. Ask your health care provider which countries are considered high risk. ? Your parents were born in a high-risk country, and you have not been immunized against hepatitis B (hepatitis B vaccine). ? You have HIV or AIDS. ? You use needles to inject street drugs. ? You live with someone who has hepatitis B. ? You have had sex with someone who has hepatitis B. ? You get hemodialysis treatment. ? You take certain medicines for conditions, including cancer, organ transplantation, and autoimmune conditions.  Hepatitis C  Blood testing is recommended  for: ? Everyone born from [birth year] through 1966. ? Anyone with known risk factors for hepatitis C.  Sexually transmitted infections (STIs)  You should be screened for sexually transmitted infections (STIs) including gonorrhea and chlamydia if: ? You are sexually active and are younger than 62 years of age. ? You are older than 62 years of age and your health care provider tells you that you are at risk for this type of infection. ? Your sexual activity has changed since you were last screened and you are at an increased risk for chlamydia or gonorrhea. Ask your health care provider if you are at risk.  If you do not have HIV, but are at risk, it may be recommended that you take a prescription medicine daily to prevent HIV infection. This is called pre-exposure prophylaxis (PrEP). You are considered at risk if: ? You are sexually active and do not regularly use condoms or know the HIV status of your partner(s). ? You take drugs by injection. ? You are sexually active with a partner who has HIV.  Talk with your health care provider about whether you are at high risk of being infected with HIV. If you choose to begin PrEP, you should first be tested for HIV. You should then be tested every 3 months for as long as you are taking PrEP. Pregnancy  If you are premenopausal and you may become pregnant, ask your health care provider about preconception counseling.  If you may become pregnant, take 400 to 800 micrograms (mcg) of folic acid every day.  If you want to prevent pregnancy, talk to your health care provider about birth control (contraception). Osteoporosis and menopause  Osteoporosis is a disease in which the bones lose minerals and strength with aging. This can result in serious bone fractures. Your risk for osteoporosis can be identified using a bone density scan.  If you are 1 years of age or older, or if you are at risk for osteoporosis and fractures, ask  your health care provider if  you should be screened.  Ask your health care provider whether you should take a calcium or vitamin D supplement to lower your risk for osteoporosis.  Menopause may have certain physical symptoms and risks.  Hormone replacement therapy may reduce some of these symptoms and risks. Talk to your health care provider about whether hormone replacement therapy is right for you. Follow these instructions at home:  Schedule regular health, dental, and eye exams.  Stay current with your immunizations.  Do not use any tobacco products including cigarettes, chewing tobacco, or electronic cigarettes.  If you are pregnant, do not drink alcohol.  If you are breastfeeding, limit how much and how often you drink alcohol.  Limit alcohol intake to no more than 1 drink per day for nonpregnant women. One drink equals 12 ounces of beer, 5 ounces of wine, or 1 ounces of hard liquor.  Do not use street drugs.  Do not share needles.  Ask your health care provider for help if you need support or information about quitting drugs.  Tell your health care provider if you often feel depressed.  Tell your health care provider if you have ever been abused or do not feel safe at home. This information is not intended to replace advice given to you by your health care provider. Make sure you discuss any questions you have with your health care provider. Document Released: 07/22/2010 Document Revised: 06/14/2015 Document Reviewed: 10/10/2014 Elsevier Interactive Patient Education  2018 [place] in the [place] Falls can cause injuries and can affect people from all age groups. There are many simple things that you can do to make your home safe and to help prevent falls. What can I do on the outside of my home?  Regularly repair the edges of walkways and driveways and fix any cracks.  Remove high doorway thresholds.  Trim any shrubbery on the main path into your home.  Use bright  outdoor lighting.  Clear walkways of debris and clutter, including tools and rocks.  Regularly check that handrails are securely fastened and in good repair. Both sides of any steps should have handrails.  Install guardrails along the edges of any raised decks or porches.  Have leaves, snow, and ice cleared regularly.  Use sand or salt on walkways during winter months.  In the garage, clean up any spills right away, including grease or oil spills. What can I do in the bathroom?  Use night lights.  Install grab bars by the toilet and in the tub and shower. Do not use towel bars as grab bars.  Use non-skid mats or decals on the floor of the tub or shower.  If you need to sit down while you are in the shower, use a plastic, non-slip stool.  Keep the floor dry. Immediately clean up any water that spills on the floor.  Remove soap buildup in the tub or shower on a regular basis.  Attach bath mats securely with double-sided non-slip rug tape.  Remove throw rugs and other tripping hazards from the floor. What can I do in the bedroom?  Use night lights.  Make sure that a bedside light is easy to reach.  Do not use oversized bedding that drapes onto the floor.  Have a firm chair that has side arms to use for getting dressed.  Remove throw rugs and other tripping hazards from the floor. What can I do in the kitchen?  Clean  up any spills right away.  Avoid walking on wet floors.  Place frequently used items in easy-to-reach places.  If you need to reach for something above you, use a sturdy step stool that has a grab bar.  Keep electrical cables out of the way.  Do not use floor polish or wax that makes floors slippery. If you have to use wax, make sure that it is non-skid floor wax.  Remove throw rugs and other tripping hazards from the floor. What can I do in the stairways?  Do not leave any items on the stairs.  Make sure that there are handrails on both sides of  the stairs. Fix handrails that are broken or loose. Make sure that handrails are as long as the stairways.  Check any carpeting to make sure that it is firmly attached to the stairs. Fix any carpet that is loose or worn.  Avoid having throw rugs at the top or bottom of stairways, or secure the rugs with carpet tape to prevent them from moving.  Make sure that you have a light switch at the top of the stairs and the bottom of the stairs. If you do not have them, have them installed. What are some other fall prevention tips?  Wear closed-toe shoes that fit well and support your feet. Wear shoes that have rubber soles or low heels.  When you use a stepladder, make sure that it is completely opened and that the sides are firmly locked. Have someone hold the ladder while you are using it. Do not climb a closed stepladder.  Add color or contrast paint or tape to grab bars and handrails in your home. Place contrasting color strips on the first and last steps.  Use mobility aids as needed, such as canes, walkers, scooters, and crutches.  Turn on lights if it is dark. Replace any light bulbs that burn out.  Set up furniture so that there are clear paths. Keep the furniture in the same spot.  Fix any uneven floor surfaces.  Choose a carpet design that does not hide the edge of steps of a stairway.  Be aware of any and all pets.  Review your medicines with your healthcare provider. Some medicines can cause dizziness or changes in blood pressure, which increase your risk of falling. Talk with your health care provider about other ways that you can decrease your risk of falls. This may include working with a physical therapist or trainer to improve your strength, balance, and endurance. This information is not intended to replace advice given to you by your health care provider. Make sure you discuss any questions you have with your health care provider. Document Released: 12/27/2001 Document  Revised: 06/05/2015 Document Reviewed: 02/10/2014 Elsevier Interactive Patient Education  2017 [place]

## 2016-12-19 ENCOUNTER — Ambulatory Visit: Payer: Medicare Other

## 2016-12-19 ENCOUNTER — Ambulatory Visit (INDEPENDENT_AMBULATORY_CARE_PROVIDER_SITE_OTHER): Payer: Medicare Other | Admitting: Family Medicine

## 2016-12-19 ENCOUNTER — Telehealth: Payer: Self-pay | Admitting: *Deleted

## 2016-12-19 VITALS — BP 142/92 | HR 87 | Ht 62.0 in | Wt 196.0 lb

## 2016-12-19 DIAGNOSIS — I10 Essential (primary) hypertension: Secondary | ICD-10-CM

## 2016-12-19 DIAGNOSIS — E119 Type 2 diabetes mellitus without complications: Secondary | ICD-10-CM

## 2016-12-19 DIAGNOSIS — E785 Hyperlipidemia, unspecified: Secondary | ICD-10-CM | POA: Diagnosis not present

## 2016-12-19 DIAGNOSIS — Z0000 Encounter for general adult medical examination without abnormal findings: Secondary | ICD-10-CM | POA: Diagnosis not present

## 2016-12-19 LAB — BASIC METABOLIC PANEL
BUN: 14 mg/dL (ref 6–23)
CHLORIDE: 104 meq/L (ref 96–112)
CO2: 31 mEq/L (ref 19–32)
Calcium: 9.4 mg/dL (ref 8.4–10.5)
Creatinine, Ser: 0.6 mg/dL (ref 0.40–1.20)
GFR: 107.53 mL/min (ref >60.00)
GLUCOSE: 94 mg/dL (ref 70–99)
POTASSIUM: 4.2 meq/L (ref 3.5–5.1)
SODIUM: 143 meq/L (ref 135–145)

## 2016-12-19 LAB — CBC
HCT: 49.1 % — ABNORMAL HIGH (ref 36.0–46.0)
HEMOGLOBIN: 16 g/dL — AB (ref 12.0–15.0)
MCHC: 32.6 g/dL (ref 30.0–36.0)
MCV: 91.4 fl (ref 78.0–100.0)
PLATELETS: 312 10*3/uL (ref 150.0–400.0)
RBC: 5.37 Mil/uL — ABNORMAL HIGH (ref 3.87–5.11)
RDW: 13.9 % (ref 11.5–15.5)
WBC: 6.6 10*3/uL (ref 4.0–10.5)

## 2016-12-19 LAB — HEMOGLOBIN A1C: Hgb A1c MFr Bld: 6.9 % — ABNORMAL HIGH (ref 4.6–6.5)

## 2016-12-19 NOTE — Progress Notes (Signed)
Meant to have patient recheck blood pressure before leaving.  Will have assistant call her to come back in for blood pressure recheck in 1 week.  Blood pressure was low at several office visits before this.

## 2016-12-19 NOTE — Patient Instructions (Addendum)
BEFORE YOU LEAVE: -labs -follow up: 3-4 months   Linda Thompson , Thank you for taking time to come for your Medicare Wellness Visit. I appreciate your ongoing commitment to your health goals. Please review the following plan we discussed and let me know if I can assist you in the future.   The Centers for Disease Control are now recommending 2 pneumonia vaccinations after 63. The first is the Prevnar 13. This helps to boost your immunity to community acquired pneumonia as well as some protection from bacterial pneumonia  The 2nd is the pneumovax 23, which offers more broad protection!  Please consider taking these as this is your best protection against pneumonia. You will have the [place] at 3 and at 61 yo, you will have the PSV 23 or pneumonvax  Deaf & Hard of Hearing Division Services - can assist with hearing aid x 1  No reviews  [place]  [address]  [phone]  You are due a pap; It is scheduled for March; Dr. Hollice Espy  Prevention of falls: Remove rugs or any tripping hazards in the home Use Non slip mats in bathtubs and showers Placing grab bars next to the toilet and or shower Placing handrails on both sides of the stair way Adding extra lighting in the home.   History of fall have been outside   Personal safety issues reviewed:  1. Consider starting a community watch program per [facility] 2.  Changes batteries is smoke detector and/or carbon monoxide detector  3.  If you have firearms; keep them in a safe place 4.  Wear protection when in the sun; Always wear sunscreen or a hat; It is good to have your doctor check your skin annually or review any new areas of concern 5. Driving safety; Keep in the right lane; stay 3 car lengths behind the car in front of you on the highway; look 3 times prior to pulling out; carry your cell phone everywhere you go!    Learn about the Yellow Dot program:  The program allows first responders at your  emergency to have access to who your physician is, as well as your medications and medical conditions.  Citizens requesting the Yellow Dot Packages should contact Master Corporal Nunzio Cobbs at the [facility] [phone] for the first week of the program and beginning the week after Easter citizens should contact their Scientist, physiological.  Shingrix is a vaccine for the prevention of Shingles in Adults 50 and older.  If you are on Medicare, you can request a prescription from your doctor to be filled at a pharmacy.  Please check with your benefits regarding applicable copays or out of pocket expenses.  The Shingrix is given in 2 vaccines approx 8 weeks apart. You must receive the 2nd dose prior to 6 months from receipt of the first.     These are the goals we discussed:This is a list of community exercise available to help people get stronger and decrease the risk for falls. You can also ask your nurse to assist you for 20 minutes to walk in the neighborhood  Goals    . Exercise 150 min/wk Moderate Activity     Manufacturing engineer of Services Cost  A Matter of Balance Class locations vary. Call [place] on Aging for more information.  http://dawson-[email or web address]/ [phone] 8-Session program addressing the fear of falling and increasing activity  levels of older adults Free to minimal cost  A.C.T. By [employer] [address], [place], [place] [address].  BetaBlues.dk [phone]  Personal training, gym, classes including Silver Sneakers* and ACTion for Aging Adults Fee-based  A.H.O.Y. (Add Health to [place]) Airs on Time [employer] 13, M-F at [place]: [employer],  [place] [employer] [place],  [place], [place] [facility], [address] [facility], [place], [place], [place] [address]  [place] Location: Sharrell Ku. [employer] [place]      [phone]  [phone]  [phone]  [phone]  [phone]  [phone]  [phone]  [phone]  [phone]  [phone]    [phone] A total-body conditioning class for adults 61 and older; designed to increase muscular strength, endurance, range of movement, flexibility, balance, agility and coordination Free  [facility] [place], [place] [address] [place]      [address]      [phone]      Pilate's class for individualsreturning to exercise after an injury, before or after surgery or for individuals with complex musculoskeletal issues; designed to improve strength, balance , flexibility      $15/class  [employer] [address] [place], [place] [address] www.[email or web address] [place] classes for beginners to advanced [place], [place] [address] [email or web address] [place] Chair Exercises Free, ages 75 and older; Ages 32-59 fee based  Marvia Pickles, [facility] [address] [place], [place] [address] [email or web address] .gov [phone]  A.H.O.Y. Tai Chi Fee-based Donation based or free  [place] Class locations vary.  Call or email Angela Burke or view website for more information. [email or web address] .[email or web address] GainPain.com.[email or web address] [phone] Ongoing classes at local [employer] and gyms Fee-based   Silver Sneakers A.C.T. By Gillis [employer]: [place] [phone]  [phone] Classes designed for older adults who want to improve their strength, flexibility, balance and endurance.   Silver sneakers is covered by some insurance plans and includes a fitness center membership at participating locations. Find out more by calling [phone] or visiting [email or web address] Covered by some insurance plans  [facility] [place] [phone] A.H.O.Y., fitness room, personal training, fitness classes for injury prevention, strength, balance, flexibility, water fitness classes Ages 55+: $52 for 6 months; Ages 69-54: $30 for 6 months  Tai Chi for Everybody [facility] [address] [place],  [address] [email or web address] .[email or web address] Sears [phone] Tai Chi classes for beginners to advanced; geared for seniors Donation Based      [employer] ([place] Others Participate in Exercise     Loyal Gambler. Rosana Hoes, PhD, [place] [email or web address] [place]     [phone]     A comprehensive fitness program for adults.  The program paris  senior-level Therapist, nutritional students with adults who desire to learn how to exercise safely.  Includes a structural exercise class focusing on functional fitnesss     $100/semester in fall and spring; $75 in summer (no trainers)    *Silver Sneakers is covered by some Personal assistant and includes a  Radio producer at participating locations.  Find out more by calling [phone] or visiting [email or web address]  For additional health  and human services resources for senior adults, please contact SeniorLine at [phone] in [place] and [place] at [phone] in all other areas.       This is a list of the screening recommended for you and due dates:  Health Maintenance  Topic Date Due  . Hemoglobin A1C  11/18/2016  . Pap Smear  01/16/2017  . Complete foot exam   02/17/2017  . Eye exam for diabetics  05/14/2017  . Urine Protein Check  08/18/2017  . Mammogram  09/24/2017  . Colon Cancer Screening  04/18/2018  . Tetanus Vaccine  08/07/2024  . Pneumococcal vaccine  Completed  . Flu Shot  Completed  .  Hepatitis C: One time screening is recommended by Center for Disease Control  (CDC) for  adults born from [birth year] through 1965.   Completed  . HIV Screening  Completed   Health Maintenance, Female Adopting a healthy lifestyle and getting preventive care can go a long way to promote health and wellness. Talk with your health care provider about what schedule of regular examinations is right for you. This is a good chance for you to check in with your provider about disease prevention and staying healthy. In between checkups, there are Thompson of things you can do on your own. Experts have done a lot of research about which lifestyle changes and preventive measures are most likely to keep you healthy. Ask your health care provider for more information. Weight and diet Eat a healthy diet  Be sure to include Thompson of vegetables, fruits, low-fat dairy products, and lean protein.  Do not eat a lot of foods high in solid fats, added sugars, or salt.  Get regular exercise. This is one of the most important things you can do for your health. ? Most adults should exercise for at least 150 minutes each week. The exercise should increase your heart rate and make you sweat (moderate-intensity exercise). ? Most adults should also do strengthening exercises at least twice a week. This is in addition to the moderate-intensity  exercise.  Maintain a healthy weight  Body mass index (BMI) is a measurement that can be used to identify possible weight problems. It estimates body fat based on height and weight. Your health care provider can help determine your BMI and help you achieve or maintain a healthy weight.  For females 59 years of age and older: ? A BMI below 18.5 is considered underweight. ? A BMI of 18.5 to 24.9 is normal. ? A BMI of 25 to 29.9 is considered overweight. ? A BMI of 30 and above is considered obese.  Watch levels of cholesterol and blood lipids  You should start having your blood tested for lipids and cholesterol at 62 years of age, then have this test every 5 years.  You may need to have your cholesterol levels checked more often if: ? Your lipid or cholesterol levels are high. ? You are older than 62 years of age. ? You are at high risk for heart disease.  Cancer screening Lung Cancer  Lung cancer  screening is recommended for adults 27-37 years old who are at high risk for lung cancer because of a history of smoking.  A yearly low-dose CT scan of the lungs is recommended for people who: ? Currently smoke. ? Have quit within the past 15 years. ? Have at least a 30-pack-year history of smoking. A pack year is smoking an average of one pack of cigarettes a day for 1 year.  Yearly screening should continue until it has been 15 years since you quit.  Yearly screening should stop if you develop a health problem that would prevent you from having lung cancer treatment.  Breast Cancer  Practice breast self-awareness. This means understanding how your breasts normally appear and feel.  It also means doing regular breast self-exams. Let your health care provider know about any changes, no matter how small.  If you are in your 20s or 30s, you should have a clinical breast exam (CBE) by a health care provider every 1-3 years as part of a regular health exam.  If you are 72 or older, have  a CBE every year. Also consider having a breast X-ray (mammogram) every year.  If you have a family history of breast cancer, talk to your health care provider about genetic screening.  If you are at high risk for breast cancer, talk to your health care provider about having an MRI and a mammogram every year.  Breast cancer gene (BRCA) assessment is recommended for women who have family members with BRCA-related cancers. BRCA-related cancers include: ? Breast. ? Ovarian. ? Tubal. ? Peritoneal cancers.  Results of the assessment will determine the need for genetic counseling and BRCA1 and BRCA2 testing.  Cervical Cancer Your health care provider may recommend that you be screened regularly for cancer of the pelvic organs (ovaries, uterus, and vagina). This screening involves a pelvic examination, including checking for microscopic changes to the surface of your cervix (Pap test). You may be encouraged to have this screening done every 3 years, beginning at age 66.  For women ages 44-65, health care providers may recommend pelvic exams and Pap testing every 3 years, or they may recommend the Pap and pelvic exam, combined with testing for human papilloma virus (HPV), every 5 years. Some types of HPV increase your risk of cervical cancer. Testing for HPV may also be done on women of any age with unclear Pap test results.  Other health care providers may not recommend any screening for nonpregnant women who are considered low risk for pelvic cancer and who do not have symptoms. Ask your health care provider if a screening pelvic exam is right for you.  If you have had past treatment for cervical cancer or a condition that could lead to cancer, you need Pap tests and screening for cancer for at least 20 years after your treatment. If Pap tests have been discontinued, your risk factors (such as having a new sexual partner) need to be reassessed to determine if screening should resume. Some women have  medical problems that increase the chance of getting cervical cancer. In these cases, your health care provider may recommend more frequent screening and Pap tests.  Colorectal Cancer  This type of cancer can be detected and often prevented.  Routine colorectal cancer screening usually begins at 62 years of age and continues through 62 years of age.  Your health care provider may recommend screening at an earlier age if you have risk factors for colon cancer.  Your health care  provider may also recommend using home test kits to check for hidden blood in the stool.  A small camera at the end of a tube can be used to examine your colon directly (sigmoidoscopy or colonoscopy). This is done to check for the earliest forms of colorectal cancer.  Routine screening usually begins at age 51.  Direct examination of the colon should be repeated every 5-10 years through 62 years of age. However, you may need to be screened more often if early forms of precancerous polyps or small growths are found.  Skin Cancer  Check your skin from head to toe regularly.  Tell your health care provider about any new moles or changes in moles, especially if there is a change in a mole's shape or color.  Also tell your health care provider if you have a mole that is larger than the size of a pencil eraser.  Always use sunscreen. Apply sunscreen liberally and repeatedly throughout the day.  Protect yourself by wearing long sleeves, pants, a wide-brimmed hat, and sunglasses whenever you are outside.  Heart disease, diabetes, and high blood pressure  High blood pressure causes heart disease and increases the risk of stroke. High blood pressure is more likely to develop in: ? People who have blood pressure in the high end of the normal range (130-139/85-89 mm Hg). ? People who are overweight or obese. ? People who are African American.  If you are 31-87 years of age, have your blood pressure checked every 3-5  years. If you are 64 years of age or older, have your blood pressure checked every year. You should have your blood pressure measured twice-once when you are at a hospital or clinic, and once when you are not at a hospital or clinic. Record the average of the two measurements. To check your blood pressure when you are not at a hospital or clinic, you can use: ? An automated blood pressure machine at a pharmacy. ? A home blood pressure monitor.  If you are between 34 years and 68 years old, ask your health care provider if you should take aspirin to prevent strokes.  Have regular diabetes screenings. This involves taking a blood sample to check your fasting blood sugar level. ? If you are at a normal weight and have a low risk for diabetes, have this test once every three years after 61 years of age. ? If you are overweight and have a high risk for diabetes, consider being tested at a younger age or more often. Preventing infection Hepatitis B  If you have a higher risk for hepatitis B, you should be screened for this virus. You are considered at high risk for hepatitis B if: ? You were born in a country where hepatitis B is common. Ask your health care provider which countries are considered high risk. ? Your parents were born in a high-risk country, and you have not been immunized against hepatitis B (hepatitis B vaccine). ? You have HIV or AIDS. ? You use needles to inject street drugs. ? You live with someone who has hepatitis B. ? You have had sex with someone who has hepatitis B. ? You get hemodialysis treatment. ? You take certain medicines for conditions, including cancer, organ transplantation, and autoimmune conditions.  Hepatitis C  Blood testing is recommended for: ? Everyone born from [birth year] through 1966. ? Anyone with known risk factors for hepatitis C.  Sexually transmitted infections (STIs)  You should be screened for sexually transmitted infections (  STIs) including  gonorrhea and chlamydia if: ? You are sexually active and are younger than 62 years of age. ? You are older than 62 years of age and your health care provider tells you that you are at risk for this type of infection. ? Your sexual activity has changed since you were last screened and you are at an increased risk for chlamydia or gonorrhea. Ask your health care provider if you are at risk.  If you do not have HIV, but are at risk, it may be recommended that you take a prescription medicine daily to prevent HIV infection. This is called pre-exposure prophylaxis (PrEP). You are considered at risk if: ? You are sexually active and do not regularly use condoms or know the HIV status of your partner(s). ? You take drugs by injection. ? You are sexually active with a partner who has HIV.  Talk with your health care provider about whether you are at high risk of being infected with HIV. If you choose to begin PrEP, you should first be tested for HIV. You should then be tested every 3 months for as long as you are taking PrEP. Pregnancy  If you are premenopausal and you may become pregnant, ask your health care provider about preconception counseling.  If you may become pregnant, take 400 to 800 micrograms (mcg) of folic acid every day.  If you want to prevent pregnancy, talk to your health care provider about birth control (contraception). Osteoporosis and menopause  Osteoporosis is a disease in which the bones lose minerals and strength with aging. This can result in serious bone fractures. Your risk for osteoporosis can be identified using a bone density scan.  If you are 27 years of age or older, or if you are at risk for osteoporosis and fractures, ask your health care provider if you should be screened.  Ask your health care provider whether you should take a calcium or vitamin D supplement to lower your risk for osteoporosis.  Menopause may have certain physical symptoms and  risks.  Hormone replacement therapy may reduce some of these symptoms and risks. Talk to your health care provider about whether hormone replacement therapy is right for you. Follow these instructions at home:  Schedule regular health, dental, and eye exams.  Stay current with your immunizations.  Do not use any tobacco products including cigarettes, chewing tobacco, or electronic cigarettes.  If you are pregnant, do not drink alcohol.  If you are breastfeeding, limit how much and how often you drink alcohol.  Limit alcohol intake to no more than 1 drink per day for nonpregnant women. One drink equals 12 ounces of beer, 5 ounces of wine, or 1 ounces of hard liquor.  Do not use street drugs.  Do not share needles.  Ask your health care provider for help if you need support or information about quitting drugs.  Tell your health care provider if you often feel depressed.  Tell your health care provider if you have ever been abused or do not feel safe at home. This information is not intended to replace advice given to you by your health care provider. Make sure you discuss any questions you have with your health care provider. Document Released: 07/22/2010 Document Revised: 06/14/2015 Document Reviewed: 10/10/2014 Elsevier Interactive Patient Education  2018 [place] in the Home Falls can cause injuries and can affect people from all age groups. There are many simple things that you can do  to make your home safe and to help prevent falls. What can I do on the outside of my home?  Regularly repair the edges of walkways and driveways and fix any cracks.  Remove high doorway thresholds.  Trim any shrubbery on the main path into your home.  Use bright outdoor lighting.  Clear walkways of debris and clutter, including tools and rocks.  Regularly check that handrails are securely fastened and in good repair. Both sides of any steps should have  handrails.  Install guardrails along the edges of any raised decks or porches.  Have leaves, snow, and ice cleared regularly.  Use sand or salt on walkways during winter months.  In the garage, clean up any spills right away, including grease or oil spills. What can I do in the bathroom?  Use night lights.  Install grab bars by the toilet and in the tub and shower. Do not use towel bars as grab bars.  Use non-skid mats or decals on the floor of the tub or shower.  If you need to sit down while you are in the shower, use a plastic, non-slip stool.  Keep the floor dry. Immediately clean up any water that spills on the floor.  Remove soap buildup in the tub or shower on a regular basis.  Attach bath mats securely with double-sided non-slip rug tape.  Remove throw rugs and other tripping hazards from the floor. What can I do in the bedroom?  Use night lights.  Make sure that a bedside light is easy to reach.  Do not use oversized bedding that drapes onto the floor.  Have a firm chair that has side arms to use for getting dressed.  Remove throw rugs and other tripping hazards from the floor. What can I do in the kitchen?  Clean up any spills right away.  Avoid walking on wet floors.  Place frequently used items in easy-to-reach places.  If you need to reach for something above you, use a sturdy step stool that has a grab bar.  Keep electrical cables out of the way.  Do not use floor polish or wax that makes floors slippery. If you have to use wax, make sure that it is non-skid floor wax.  Remove throw rugs and other tripping hazards from the floor. What can I do in the stairways?  Do not leave any items on the stairs.  Make sure that there are handrails on both sides of the stairs. Fix handrails that are broken or loose. Make sure that handrails are as long as the stairways.  Check any carpeting to make sure that it is firmly attached to the stairs. Fix any carpet  that is loose or worn.  Avoid having throw rugs at the top or bottom of stairways, or secure the rugs with carpet tape to prevent them from moving.  Make sure that you have a light switch at the top of the stairs and the bottom of the stairs. If you do not have them, have them installed. What are some other fall prevention tips?  Wear closed-toe shoes that fit well and support your feet. Wear shoes that have rubber soles or low heels.  When you use a stepladder, make sure that it is completely opened and that the sides are firmly locked. Have someone hold the ladder while you are using it. Do not climb a closed stepladder.  Add color or contrast paint or tape to grab bars and handrails in your home. Place contrasting  color strips on the first and last steps.  Use mobility aids as needed, such as canes, walkers, scooters, and crutches.  Turn on lights if it is dark. Replace any light bulbs that burn out.  Set up furniture so that there are clear paths. Keep the furniture in the same spot.  Fix any uneven floor surfaces.  Choose a carpet design that does not hide the edge of steps of a stairway.  Be aware of any and all pets.  Review your medicines with your healthcare provider. Some medicines can cause dizziness or changes in blood pressure, which increase your risk of falling. Talk with your health care provider about other ways that you can decrease your risk of falls. This may include working with a physical therapist or trainer to improve your strength, balance, and endurance. This information is not intended to replace advice given to you by your health care provider. Make sure you discuss any questions you have with your health care provider. Document Released: 12/27/2001 Document Revised: 06/05/2015 Document Reviewed: 02/10/2014 Elsevier Interactive Patient Education  2017 [employer].

## 2016-12-19 NOTE — Progress Notes (Signed)
Subjective:   Linda Thompson is a 61 y.o. female who presents for Medicare Annual (Subsequent) preventive examination.  The Patient was informed that the wellness visit is to identify future health risk and educate and initiate measures that can reduce risk for increased disease through the lifespan.    Annual Wellness Assessment  Reports health as good   Preventive Screening -Counseling & Management  Medicare Annual Preventive Care Visit - Subsequent Last OV today   Health Maintenance Due  Topic Date Due  . HEMOGLOBIN A1C  11/18/2016   A1c to be checked today   Mammogram 09/2016 Dexa < 65 Pap 2015 and due 12/ 2018 - see  Linda Thompson CNM   VS reviewed;  BP elevated moderately but will check again  Diet  Breakfast; had grits; eggs; sometimes makes a sandwich; Lunch; same Supper sandwich; spaghetti Has no issues getting groceries    BMI 35.8   Exercise Does not exercise States she fell last year outside - sprain her ankle  States she got her walker last year Was told issues are in her back - has OA  Had shot in knee  Was told to take her time walking Given list of exercise in the community Has alarm in apt so she can get help as needed  She lives on the 2nd floor   Hearing Screening Comments: Speaks loud;  Losing some of her hearing  May need a hearing aid   Given resources   Eye exam 04/2016 and no retinopathy Has glaucoma - pressure in her eyes  Taking Xalatan  Dental - gets her dental work regularly  Last exam 08/2016; Dr. 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 Benham Thompson GI  Dr. Gardiner Thompson podiatrist Cardiac Risk Factors include: diabetes mellitus;dyslipidemia;family history of premature cardiovascular disease;hypertension;obesity (BMI >30kg/m2);sedentary lifestyle     Objective:     Vitals: BP (!) 142/92   Pulse 87   Ht '5\' 2"'$  (1.575 m)   Wt 196 lb (88.9 kg)   LMP 01/21/1992 (Approximate)   SpO2 96%   BMI 35.85 kg/m   Body mass index is 35.85 kg/m.  Advanced Directives 12/19/2016 02/12/2016 01/03/2016 12/11/2015 11/29/2015 04/06/2015 02/01/2015  Does Patient Have a Medical Advance Directive? Yes No Yes No Yes Yes Yes  Type of Advance Directive - - [place];Living will;Out of facility DNR (pink MOST or yellow form) [place];Living will [place];Living will [place];Living will [place]  Does patient want to make changes to medical advance directive? - - No - Patient declined No - Patient declined No - Patient declined - No - Patient declined  Copy of [place] in Chart? - - Yes - No - copy requested - No - copy requested  Would patient like information on creating a medical advance directive? - No - Patient declined - No - Patient declined - - -    Tobacco Social History   Tobacco Use  Smoking Status Never Smoker  Smokeless Tobacco Never Used     Counseling given: Yes   Past Medical History:  Diagnosis Date  . Allergic rhinitis   . Allergy   . Anemia   . Chronic low back pain   . DDD (degenerative disc disease), lumbar   . GERD (gastroesophageal reflux disease)    w/ hx diaphragmatic hernia  . Glaucoma   . GLAUCOMA NOS 03/11/2006   Qualifier: Diagnosis of  By: Linda Browner MD, Linda Thompson    . Heart murmur   . Hx of fracture    R arm, foot  . Hyperlipemia   . Hypertension   . IBS (irritable bowel syndrome)   . Low back pain 11/22/2012  . Meningioma ([place])    ant falx, stable  on prior imaging  . Moderate intellectual disabilities   . Murmur   . Obesity   . Osteoarthrosis, unspecified whether generalized or localized, ankle and foot   . Sarcoidosis    difuse bony lesions and LDA, dx by biopsy in 2016  . Transaminitis   . Type II or unspecified type diabetes mellitus without mention of complication, not stated as uncontrolled    Past Surgical History:  Procedure Laterality Date  . CATARACT EXTRACTION Bilateral    Dr Linda Thompson  . COLONOSCOPY  04/15/2011  . ELBOW SURGERY     right elbow  . LIPOMA EXCISION Left [id number]   posterior left axilla  . TOTAL ABDOMINAL HYSTERECTOMY  03/05/1992   leiomyoma and cellular leiomyoma   Family History  Problem Relation Age of Onset  . Diabetes Mother   . Stroke Mother   . Hypertension Mother   . Stroke Father   . Diabetes Maternal Aunt   . Heart disease Maternal Aunt         (had pacemaker)  . Diabetes Maternal Grandmother   . Stroke Maternal Grandmother   . Hypertension Maternal Grandmother   . Colon cancer Neg Hx    Social History   Socioeconomic History  . Marital status: Single    Spouse name: Not on file  . Number of children: 0  . Years of education: Not on file  . Highest education level: Not on file  Social Needs  . Financial resource strain: Not on file  . Food insecurity - worry: Not on file  . Food insecurity - inability: Not on file  . Transportation needs - medical: Not on file  . Transportation needs - non-medical: Not on file  Occupational History  . Occupation: Disabled (from arm fracture)  Tobacco Use  . Smoking status: Never Smoker  . Smokeless tobacco: Never Used  Substance and Sexual Activity  . Alcohol use: No    Alcohol/week: 0.0 oz  . Drug use: No  . Sexual activity: No    Birth control/protection: Surgical    Comment: Hyst--TAH--unsure if has ovaries  Other Topics Concern  . Not on file  Social History Narrative         Currently-disability for arm fracture      Mental  Retardation, but is able to drive to places she is comfortable with and prepare her own meals and manage most of her own affairs.      Linda Thompson (neighbor) is POA/HCPOA      Reports she gets regular exercise and tries to eat healthy    Outpatient Encounter Medications as of 12/19/2016  Medication Sig  . acetaminophen (TYLENOL) 500 MG tablet Take 500 mg by mouth as needed (left ear  pain). Reported on 04/18/2015  . clotrimazole (LOTRIMIN) 1 % cream APPLY TWICE A DAY FOR 7 DAYS AS DIRECTED  . diltiazem (CARDIZEM CD) 240 MG 24 hr capsule TAKE ONE CAPSULE BY MOUTH EVERY DAY  . ferrous sulfate 325 (65 FE) MG tablet Take 1 tablet (325 mg total) daily with breakfast by mouth.  . fluconazole (DIFLUCAN) 150 MG tablet Take one tablet today. Repeat one in one week if  Needed.  . fluticasone (FLONASE) 50 MCG/ACT nasal spray Place into both nostrils daily.  . hydrochlorothiazide (MICROZIDE) 12.5 MG capsule TAKE 1 CAPSULE BY MOUTH ONCE DAILY  . hydrocortisone (ANUSOL-HC) 25 MG suppository UNWRAP AND INSERT 1 SUPPOSITORY IN THE RECTUM TWICE A DAY  . hydrocortisone 2.5 % ointment APPLY TO AFFECTED AREA TWICE A DAY  . ipratropium (ATROVENT) 0.06 % nasal spray PLACE 2 SPRAYS INTO BOTH NOSTRILS 4 (FOUR) TIMES DAILY.  Marland Kitchen ketoconazole (NIZORAL) 2 % shampoo APPLY TO AFFECTED AREA TWICE A WEEK AS DIRECTED  . KLOR-CON M20 20 MEQ tablet TAKE 1 TABLET (20 MEQ TOTAL) BY MOUTH DAILY.  . Lactobacillus (FLORAJEN ACIDOPHILUS PO) Take 1 tablet by mouth daily.  Marland Kitchen latanoprost (XALATAN) 0.005 % ophthalmic solution Place 1 drop into both eyes at bedtime.   Marland Kitchen liver oil-zinc oxide (DESITIN) 40 % ointment Apply 1 application topically as needed for irritation.  . Loperamide HCl 1 MG/7.5ML SUSP Take 1 mg by mouth.  . loratadine (CLARITIN) 10 MG tablet Take 10 mg by mouth daily.  . meloxicam (MOBIC) 15 MG tablet Take 1 tablet (15 mg total) by mouth daily.  . metFORMIN (GLUCOPHAGE) 500 MG tablet TAKE 1 TABLET BY MOUTH TWICE A DAY  WITH A MEAL  . omeprazole (PRILOSEC) 20 MG capsule TAKE 1 CAPSULE (20 MG TOTAL) BY MOUTH 2 (TWO) TIMES DAILY.  . pravastatin (PRAVACHOL) 40 MG tablet TAKE 1 TABLET BY MOUTH EVERY EVENING  . Propylene Glycol (SYSTANE BALANCE) 0.6 % SOLN   . traMADol (ULTRAM) 50 MG tablet Take 50 mg by mouth daily.  Marland Kitchen UNKNOWN TO PATIENT Over the counter pill for her ears, takes 4 pills twice a day, unsure of name of meds  . Vitamins-Lipotropics ([facility] INNER EAR PLUS PO) Take by mouth.  . [DISCONTINUED] meloxicam (MOBIC) 15 MG tablet TAKE 1 TABLET BY MOUTH EVERY DAY   No facility-administered encounter medications on file as of 12/19/2016.     Activities of Daily Living In your present state of health, do you have any difficulty performing the following activities: 12/19/2016 02/21/2016  Hearing? N N  Vision? N N  Difficulty concentrating or making decisions? N N  Comment Still able to take care of herself and keep up with her apt -  Walking or climbing stairs? Y N  Comment did have to use them when the power went out -  Dressing or bathing? N N  Doing errands, shopping? Y N  Comment nurse helps her but she can go by herself -  Conservation officer, nature and eating ? N N  Comment States she has no issues getting food  -  Using the Toilet? N N  In the past six months, have you accidently leaked urine? N N  Do you have problems with loss of bowel control? N N  Managing your Medications? N N  Comment States she has a pill box  -  Managing your Finances? N N  Comment her lawyer does  -  Housekeeping or managing your Housekeeping? N N  Some recent data might be  hidden    Timed Get Up and Go is normal. She moves out with walker at a brisk pace   Patient Care Team: Linda Kern, DO as PCP - General (Family Medicine)    Assessment:     Exercise Activities and Dietary recommendations Current Exercise Habits: Home exercise routine, Type of exercise: walking(encouraging her to increase )  Goals    . Exercise 150  min/wk Moderate Activity     Manufacturing engineer of Services Cost  A Matter of Balance Class locations vary. Call [place] on Aging for more information.  http://dawson-may.[email or web address]/ [phone] 8-Session program addressing the fear of falling and increasing activity levels of older adults Free to minimal cost  A.C.T. By [employer] [address], [place], [place] [address].  BetaBlues.dk [phone]  Personal training, gym, classes including Silver Sneakers* and ACTion for Aging Adults Fee-based  A.H.O.Y. (Add Health to [place]) Airs on Time [employer] 13, M-F at [place]: [employer],  [place] [facility] [place],  [place], [place] [facility], [address] [facility] [place], [place], [place], [place] [address]  [place] Location: Sharrell Ku. [employer] [place]      [phone]  [phone]  [phone]  [phone]  [phone]  [phone]  [phone]  [phone]  [phone]  [phone]    [phone] A total-body conditioning class for adults 36 and older; designed to increase muscular strength, endurance, range of movement, flexibility, balance, agility and coordination Free  [facility] [place], [place] [address] [place]      [address]      [phone]      [place]'s class for individualsreturning to exercise after an injury, before or after surgery or for individuals with complex musculoskeletal issues; designed to improve strength, balance , flexibility       $15/class  [employer] [address] [place], [place] [address] www.[email or web address].dk [place] classes for beginners to advanced [place], [place] [address] Seniorcenter'[email or web address] [email or web address]-[email or web address].[email or web address] [place] Chair Exercises Free, ages 89 and older; Ages 42-59 fee based  Marvia Pickles, [facility] [address] [place], [place] [address] Seniorcenter'[email or web address] .gov [phone]  A.H.O.Y. Tai Chi Fee-based Donation based or free  [place] Class locations vary.  Call or email Angela Burke or view website for more information. [email or web address] .[email or web address] GainPain.com.cy.html [phone] Ongoing classes at local YMCAs and gyms Fee-based  Silver Sneakers A.C.T. By [place] [employer]: [place] [phone]  [phone] Classes designed for older adults who want to improve their strength, flexibility, balance and endurance.   Silver sneakers is covered by some insurance plans and includes a fitness center membership at participating locations. Find out more by calling [phone]  or visiting www.[email or web address] Covered by some insurance plans  [facility] [phone] A.H.O.Y., fitness room, personal training, fitness classes for injury prevention, strength, balance, flexibility, water fitness classes Ages 55+: $74 for 6 months; Ages 72-54: $78 for 6 months   Tai Chi for Everybody [facility] [address] [place], [place] [address] Taichiforeverybody'[email or web address] .Patsi Sears [phone] Tai Chi classes for beginners to advanced; geared for seniors Donation Based      [employer] ([place] Others Participate in Exercise     Loyal Gambler. Rosana Hoes, PhD, [employer] [email or web address] [place]     [phone]     A comprehensive fitness program for adults.  The program paris senior-level undergraduates Kinesiology students with adults who desire to learn how to exercise safely.  Includes a structural exercise class focusing on functional fitnesss     $100/semester in fall and spring; $75 in summer (no trainers)    *Silver Sneakers is covered by some Personal assistant and includes a  Radio producer at participating locations.  Find out more by calling [phone] or visiting [email or web address]  For additional health and human services resources for senior adults, please contact SeniorLine at [phone] in [place] and [place] at [phone] in all other areas.      Fall Risk Fall Risk  12/19/2016 12/19/2016 03/21/2016 02/21/2016 02/21/2016  Falls in the past year? - Yes Yes Yes Yes  Comment - - Falls: (2)  12/11/15 and 02/12/16 last fall dates:  12/11/15 and 02/12/16 last fall date 12/11/15  Number falls in past yr: - 2 or more 2 or more 2 or more 1  Injury with Fall? - - Yes Yes Yes  Risk Factor Category  - - High Fall Risk High Fall Risk High Fall Risk  Risk for fall due to : Impaired balance/gait - History of fall(s);Impaired balance/gait History of fall(s);Impaired balance/gait History of fall(s);Impaired mobility  Follow up Education provided Education provided Falls evaluation completed;Education provided;Falls prevention discussed Education provided;Falls evaluation completed;Falls prevention discussed Falls evaluation completed;Falls prevention discussed;Education provided  Comment - sprained ankle  - - -   Is the  patient's home free of loose throw rugs in walkways, pet beds, electrical cords, etc?  yes      Grab bars in the bathroom?yes      Handrails on the stairs? yes      Adequate lighting?  Yes She lives in a handicapped secure apt for handicapped adults   Depression Screen PHQ 2/9 Scores 12/19/2016 03/21/2016 02/21/2016 01/30/2016  PHQ - 2 Score 0 0 0 0     Cognitive Function MMSE - Mini Mental State Exam 12/19/2016 12/11/2015  Not completed: (No Data) (No Data)  no issues with her current living status Takes her own meds       Immunization History  Administered Date(s) Administered  . Influenza Split 09/19/2010  . Influenza Whole 11/12/2004, 10/21/2006, 10/18/2007, 10/19/2008, 10/25/2009  . Influenza,inj,Quad PF,6+ Mos 09/17/2012, 09/19/2013, 09/13/2014  . Influenza-Unspecified 08/31/2015, 09/06/2016  . Pneumococcal Polysaccharide-23 03/10/2002, 02/08/2009  . Td 03/10/2002, 08/08/2014  . Zoster 08/08/2014   Screening Tests Health Maintenance  Topic Date Due  . HEMOGLOBIN A1C  11/18/2016  . PAP SMEAR  01/16/2017  . FOOT EXAM  02/17/2017  . OPHTHALMOLOGY EXAM  05/14/2017  . URINE MICROALBUMIN  08/18/2017  . MAMMOGRAM  09/24/2017  . COLONOSCOPY  04/18/2018  . TETANUS/TDAP  08/07/2024  . PNEUMOCOCCAL POLYSACCHARIDE VACCINE  Completed  . INFLUENZA VACCINE  Completed  . Hepatitis  C Screening  Completed  . HIV Screening  Completed   Cancer Screenings: Lung: does not smoke Breast: Up to date on Mammogram? Yes  Up to date of Bone Density/Dexa? Not due; followed by GYN  Colorectal: completed in 03/2015 and to repeat in 03/2018  Additional Screenings:  Hepatitis B/HIV/Syphillis:deferred to GYN  Hepatitis C Screening:  3.2017 and was neg     Plan:     PCP Notes  Health Maintenance Encouraging her to stay active; given community resources  Diet seems repetitives, but denies need for resources  Suggested senior meal sites but not interested at this time  Discussed free  hearing aid if needed  Also educated regarding the shingrix; Pap needed in Dec but states she has apt with GYN in March    Abnormal Screens  BP elevated today; states she had her BP med 152/90   Referrals  None   Patient concerns; None   Nurse Concerns; Hx of falls x 2;both outside; talked about exercising in community or to have her nurse walk with her 20 minutes during her visit 2 times a week Given fall prevention information Has pull cord in the bathroom in apt    Next PCP apt Today   I have personally reviewed and noted the following in the patient's chart:   . Medical and social history . Use of alcohol, tobacco or illicit drugs  . Current medications and supplements . Functional ability and status . Nutritional status . Physical activity . Advanced directives . List of other physicians . Hospitalizations, surgeries, and ER visits in previous 12 months . Vitals . Screenings to include cognitive, depression, and falls . Referrals and appointments  In addition, I have reviewed and discussed with patient certain preventive protocols, quality metrics, and best practice recommendations. A written personalized care plan for preventive services as well as general preventive health recommendations were provided to patient.     GBKOR,JGYLU, RN  12/19/2016  Linda Thompson., DO

## 2016-12-19 NOTE — Telephone Encounter (Signed)
Per Dr Maudie Mercury she noticed the pts blood pressure was a little elevated when she was seen by Manuela Schwartz today.  Dr Maudie Mercury asked if the pt could schedule a nurse visit in the next week or two to have this checked and I left a detailed message with this information at the pts home number.

## 2016-12-23 ENCOUNTER — Encounter: Payer: Self-pay | Admitting: Family Medicine

## 2016-12-23 ENCOUNTER — Ambulatory Visit (INDEPENDENT_AMBULATORY_CARE_PROVIDER_SITE_OTHER): Payer: Medicare Other | Admitting: Family Medicine

## 2016-12-23 ENCOUNTER — Ambulatory Visit: Payer: Medicare Other | Admitting: *Deleted

## 2016-12-23 VITALS — BP 118/70 | HR 89 | Temp 97.8°F | Ht 62.0 in | Wt 198.2 lb

## 2016-12-23 DIAGNOSIS — I10 Essential (primary) hypertension: Secondary | ICD-10-CM

## 2016-12-23 NOTE — Progress Notes (Signed)
Error.  This was a nurse visit.

## 2016-12-24 ENCOUNTER — Telehealth: Payer: Self-pay | Admitting: Certified Nurse Midwife

## 2016-12-30 NOTE — Telephone Encounter (Signed)
error 

## 2017-01-05 ENCOUNTER — Telehealth: Payer: Self-pay | Admitting: Certified Nurse Midwife

## 2017-01-05 NOTE — Telephone Encounter (Signed)
Patient says she is having some irritation and would like an appointment.

## 2017-01-05 NOTE — Telephone Encounter (Signed)
Spoke with patient. Patient states that she had a bowel movement this morning and had burning when wiping. Has been using Balmex without relief. Would like to be seen for evaluation. Also wants to be checked for yeast. "While I am there I want her to check everything out since the holidays are coming." Aware [name] is out of the office today. Wants an appointment tomorrow. Appointment scheduled for 01/06/2017 at 9:30 am with [name]. Patient is agreeable to date and time. Aware to keep her rectum clean and dry. May continue using Balmex as needed.  Routing to provider for final review. Patient agreeable to disposition. Will close encounter.

## 2017-01-06 ENCOUNTER — Encounter: Payer: Self-pay | Admitting: Certified Nurse Midwife

## 2017-01-06 ENCOUNTER — Ambulatory Visit (INDEPENDENT_AMBULATORY_CARE_PROVIDER_SITE_OTHER): Payer: Medicare Other | Admitting: Certified Nurse Midwife

## 2017-01-06 ENCOUNTER — Other Ambulatory Visit: Payer: Self-pay

## 2017-01-06 VITALS — BP 120/72 | HR 68 | Resp 16 | Ht 62.0 in | Wt 199.0 lb

## 2017-01-06 DIAGNOSIS — N761 Subacute and chronic vaginitis: Secondary | ICD-10-CM

## 2017-01-06 DIAGNOSIS — Z8639 Personal history of other endocrine, nutritional and metabolic disease: Secondary | ICD-10-CM | POA: Diagnosis not present

## 2017-01-06 DIAGNOSIS — B372 Candidiasis of skin and nail: Secondary | ICD-10-CM | POA: Diagnosis not present

## 2017-01-06 NOTE — Progress Notes (Signed)
61 y.o. Single Caucasian female [name] here with complaint of vaginal/rectal symptoms of itching. Has some sweating in groin area on left with redness. Using Balmex as needed for weeping or wetness for protection. Area of itching is above rectum opening. Denies rectal bleeding or constipation. No vaginal discharge. "Same old thing". Dressed in sweat shirt and pants today. Diabetes in control per patient. Pasty Arch, MD for follow up.. Onset of symptoms 4-5 days ago. Denies new personal products. No new medications.  Urinary symptoms none . Menopausal. Not sexually active ever.  ROS pertinent to above  O:Healthy female WDWN Affect: normal, orientation x 3  Exam:Skin: warm and dry Abdomen: non tender, soft  inguinal Lymph nodes: no enlargement or tenderness Pelvic exam: External genital: normal female,left groin area redness noted in lower area with weeping with skin excoriation again, wet prep taken, right groin area dry and healed. Vulva area normal appearance on left, right enlarged(known) sarcoidosis primary site, no change, no tenderness BUS: negative Vagina: slight white  Discharge, slight odor, Affirm taken Cervix: absent Uterus: absent Adnexa:absent Skin above rectal opening slight LS changes noted, "this is the area of itching"  Wet Prep results: skin only Koh, Saline + yeast   A:Normal pelvic exam Chronic history of yeast dermatitis and vaginitis History of Lichen Sclerosis History of sarcoidosis Mentally challenged lives independently with supervision History of Diabetes with PCP management   P:Discussed findings of yeast dermatitis and possible yeast in vagina. Also shown area of LS above rectal area. Discussed treatment with the groin yeast with Clotrimazole cream she brought with her twice daily x 5 days. Keep area dry if possible, Balmex use prn. Use OTC hydrocortisone on LS area bid x 5 days. If itching stops she can stop medication. All instructions printed for her to  take home. Questions answered at length and repeated instructions. Lab; Affirm   Rv prn

## 2017-01-06 NOTE — Patient Instructions (Signed)
Use Clotrimazole cream that you have and put left groin area where you are wet for 3-5 days and then can use Balmex if needed. Also put hydrocortisone cream on area in rectum for itching twice daily for 5 days only.

## 2017-01-07 LAB — VAGINITIS/VAGINOSIS, DNA PROBE
CANDIDA SPECIES: NEGATIVE
GARDNERELLA VAGINALIS: NEGATIVE
Trichomonas vaginosis: NEGATIVE

## 2017-01-09 ENCOUNTER — Telehealth: Payer: Self-pay | Admitting: Certified Nurse Midwife

## 2017-01-09 NOTE — Telephone Encounter (Signed)
Patient called with questions for the nurse about her rectal cream and continued itching. Patient requesting a call back before 10:00 AM, if possible. She's requesting to speak with Verline Lema if she is available.

## 2017-01-09 NOTE — Telephone Encounter (Signed)
Spoke with patient. Patient states that she has been using Clotrimazole and Hydrocortisone as directed. Reports she had one episode of rectal itching last night and applied Hydrocortisone. Denies any further itching since. Advised this is to be expected and is normal. Denies any prolonged itching or worsening symptoms. Advised to continue use of Clotrimazole for 3-5 days and Hydrocortisone for 5 days as directed. Patient verbalizes understanding.  Routing to provider for final review. Patient agreeable to disposition. Will close encounter.

## 2017-01-16 ENCOUNTER — Telehealth: Payer: Self-pay | Admitting: Certified Nurse Midwife

## 2017-01-16 DIAGNOSIS — H9202 Otalgia, left ear: Secondary | ICD-10-CM | POA: Diagnosis not present

## 2017-01-16 NOTE — Telephone Encounter (Signed)
Patient has questions about her medication.  °

## 2017-01-16 NOTE — Telephone Encounter (Signed)
Spoke with patient. Patient states that she use Clotrimazole cream for 5 days. Also put hydrocortisone cream on area in rectum for itching twice daily for 5 days. Patient had once episode of itching this morning and used Balmex. Asking if this is okay. Advised okay to use Balmex as needed. Patient is aware she will need to keep the area clean and dry. May continue to use baby wipes after BM. States she feels a lot better since her last visit. Aware having an itch every once and a while is normal.  Routing to provider for final review. Patient agreeable to disposition. Will close encounter.

## 2017-01-26 ENCOUNTER — Telehealth: Payer: Self-pay | Admitting: Certified Nurse Midwife

## 2017-01-26 NOTE — Telephone Encounter (Signed)
patient wants to let Jackelyn Poling know that she has a rash and she is using Balmex and it seems to be helping.

## 2017-01-26 NOTE — Telephone Encounter (Signed)
Spoke with patient. Patient states that she felt like she was getting a rash under her stomach on her left side. Started using Balmex and it seems to be relieving symptoms. Will continue to use Balmex once a day for 3 days. States she is keeping the area clean and dry. Spoke with home health nurse about this today as well and was advised okay to use Balmex. Will call with any ongoing or worsening symptoms.  Routing to provider for final review. Patient agreeable to disposition. Will close encounter.

## 2017-01-28 ENCOUNTER — Encounter: Payer: Self-pay | Admitting: Podiatry

## 2017-01-28 ENCOUNTER — Ambulatory Visit (INDEPENDENT_AMBULATORY_CARE_PROVIDER_SITE_OTHER): Payer: Medicare Other | Admitting: Podiatry

## 2017-01-28 DIAGNOSIS — E1140 Type 2 diabetes mellitus with diabetic neuropathy, unspecified: Secondary | ICD-10-CM

## 2017-01-28 DIAGNOSIS — B351 Tinea unguium: Secondary | ICD-10-CM | POA: Diagnosis not present

## 2017-01-28 DIAGNOSIS — M79676 Pain in unspecified toe(s): Secondary | ICD-10-CM

## 2017-01-28 NOTE — Progress Notes (Signed)
Patient ID: Linda Thompson, female   DOB: 02/27/1955, 61 y.o.   MRN: [id number] Complaint:  Visit Type: Patient returns to my office for continued preventative foot care services. Complaint: Patient states" my nails have grown long and thick and become painful to walk and wear shoes" Patient has been diagnosed with DM with no foot complications. The patient presents for preventative foot care services. No changes to ROS.  Patient has pain on the inside border right big toenail.   Podiatric Exam: Vascular: dorsalis pedis and posterior tibial pulses are palpable bilateral. Capillary return is immediate. Temperature gradient is WNL. Skin turgor WNL  Sensorium: Normal Semmes Weinstein monofilament test. Normal tactile sensation bilaterally. Nail Exam: Pt has thick disfigured discolored nails with subungual debris noted bilateral entire nail hallux through fifth toenails Ulcer Exam: There is no evidence of ulcer or pre-ulcerative changes or infection. Orthopedic Exam: Muscle tone and strength are WNL. No limitations in general ROM. No crepitus or effusions noted. Foot type and digits show no abnormalities. Bony prominences are unremarkable. No ankle pain noted. Skin: No Porokeratosis. No infection or ulcers  Diagnosis:  Onychomycosis, , Pain in right toe, pain in left toes  Treatment & Plan Procedures and Treatment: Consent by patient was obtained for treatment procedures. The patient understood the discussion of treatment and procedures well. All questions were answered thoroughly reviewed. Debridement of mycotic and hypertrophic toenails, 1 through 5 bilateral and clearing of subungual debris. No ulceration, no infection noted. . ABN signed for 2019. Return Visit-Office Procedure: Patient instructed to return to the office for a follow up visit 3 months for continued evaluation and treatment.   Toshua Honsinger DPM 

## 2017-02-04 ENCOUNTER — Ambulatory Visit: Payer: Medicare Other | Admitting: Podiatry

## 2017-02-05 ENCOUNTER — Telehealth: Payer: Self-pay | Admitting: Obstetrics & Gynecology

## 2017-02-05 NOTE — Telephone Encounter (Signed)
Spoke with patient. Patient states that she has noticed some slightly red skin under her belly on the left side. Asking if she can use Balmex. Patient has not used Balmex in over a week. Denies any weeping or whiteness to the skin. Advised may used Balmex once a day x 3 days. States the right side looks well. Advised to keep the area clean and dry. States she does not bathe every day. Advised needs to wash that area daily and keep it dry. Patient s agreeable.  Routing to provider for final review. Patient agreeable to disposition. Will close encounter.

## 2017-02-05 NOTE — Telephone Encounter (Signed)
Patient says she is starting to break out and would like to speak with nurse about her medication.

## 2017-02-10 ENCOUNTER — Telehealth: Payer: Self-pay | Admitting: Certified Nurse Midwife

## 2017-02-10 NOTE — Telephone Encounter (Signed)
Spoke with patient. Patient states that she had a BM and had one episode of rectal itching. Used Balmex. Asking if this is okay. Advised okay that she used Balmex. No longer having itching. Wants to know if she should continue using Balmex. Advised if she is not having any symptoms does not need to use Balmex at this time. Patient reports she has not showered today. Advised to shower and clean well in that area which will help to relieve itching as well. Patient verbalizes understanding.  Routing to provider for final review. Patient agreeable to disposition. Will close encounter.

## 2017-02-10 NOTE — Telephone Encounter (Signed)
Patient has a question about using balmex.

## 2017-02-17 ENCOUNTER — Telehealth: Payer: Self-pay | Admitting: Certified Nurse Midwife

## 2017-02-17 ENCOUNTER — Encounter: Payer: Self-pay | Admitting: Certified Nurse Midwife

## 2017-02-17 ENCOUNTER — Ambulatory Visit (INDEPENDENT_AMBULATORY_CARE_PROVIDER_SITE_OTHER): Payer: Medicare Other | Admitting: Certified Nurse Midwife

## 2017-02-17 VITALS — BP 124/70 | HR 70 | Resp 16 | Ht 62.0 in | Wt 205.0 lb

## 2017-02-17 DIAGNOSIS — B373 Candidiasis of vulva and vagina: Secondary | ICD-10-CM

## 2017-02-17 DIAGNOSIS — N952 Postmenopausal atrophic vaginitis: Secondary | ICD-10-CM

## 2017-02-17 DIAGNOSIS — B3731 Acute candidiasis of vulva and vagina: Secondary | ICD-10-CM

## 2017-02-17 NOTE — Patient Instructions (Signed)
Apply Clotrimazole cream twice daily around rectum, very small amount for 5 days.  Use coconut oil as discussed in vagina area. Shower daily

## 2017-02-17 NOTE — Telephone Encounter (Signed)
Spoke with Linda Thompson. Linda Thompson states that she started having vaginal irritation over the weekend that has not resolved. This is worse when she urinates. Has been using coconut oil without relief. Is having some internal itching. Reports she has been bathing and keeping the area dry. Wants to be seen for evaluation. Appointment scheduled for today 02/17/2017 at 10 am with [name] as Linda Thompson is concerned about bad weather later today and in the week.  Routing to provider for final review. Linda Thompson agreeable to disposition. Will close encounter.

## 2017-02-17 NOTE — Progress Notes (Signed)
62 y.o. Single Caucasian female [name] here with complaint of rectal symptoms of itching, occasional burning at times. Relates at least once daily only. No soap change and showering daily. No blood from rectum or constipation. "Sometimes vaginal itching on outside only" Uses coconut oil which stops the problem. Groin area bilateral she is keeping dry, no itching. Eating well and had good holiday with her friends. "Have a new coat" from them. No other health issues today. Has appointment with Dr. Maudie Mercury in 4/19.  .  Review of Systems  Constitutional: Negative.   Genitourinary: Negative for dysuria, flank pain, frequency, hematuria and urgency.       Has burning when urine touches the skin only, not when she urinates  Skin: Positive for itching.       Around rectal area off and on after bowel movement. Uses coconut oil if needed for dryness  Psychiatric/Behavioral: Negative for depression. The patient is not nervous/anxious.     O:Healthy female WDWN Affect: normal, orientation x 3  Exam: Skin: warm and dry Abdomen: soft, non tender, no masses  Inguinal Lymph nodes: no enlargement or tenderness Pelvic exam: External genital: normal female, atrophic appearance, no lesions, scaling or exudate. Groin area bilateral: no weeping or redness or odor  Vulva:known enlargement of right vulva with sarcoidosis site, no size change, non tender bilateral.  BUS: negative Vagina: scant non odorous discharge normal appearance Cervix: absent Uterus:absent Adnexa: no masses or fullness noted  Rectum: slight increase pink only on perineal body only, no cracking or skin . Wet prep of skin taken  Wet prep: KOH, Saline + yeast only  A:Normal pelvic exam Yeast dermatitis Mentally Challenged Diabetes Type 2 with PCP management History of Sarcoidosis   P:Discussed findings of yeast dermatitis around rectal area only and etiology. She has Monistat OTC and will use around rectal area bid x 5 days and prn for  itching. Discussed importance of shower daily and change of underwear. Continue coconut oil use on vulval area as she has continued to do, no changes and Balmex around rectum after treatment, for prn use. Questions addressed. Written printed instructions given.  Rv prn, aex 3/19

## 2017-02-17 NOTE — Telephone Encounter (Signed)
Patient would like a call back regarding medication.

## 2017-03-04 ENCOUNTER — Telehealth: Payer: Self-pay | Admitting: Certified Nurse Midwife

## 2017-03-04 NOTE — Telephone Encounter (Signed)
Spoke with patient. Patient feels that she is starting to "breal out" under her abdomen. Asking if she can use Balmex. Advised may use Balmex after she bathes and dries her skin well. May apply once daily in a thin layer for 3 days. Patient verbalizes understanding. Wants [name] to know that she no longer has a nurse that comes to her home. Still has someone who comes to clean her home that comes twice a week. States her Network engineer with her lawyer is aware and they are working with her to make sure she is cared for and will get a new nurse if needed.  Routing to provider for final review. Patient agreeable to disposition. Will close encounter.

## 2017-03-04 NOTE — Telephone Encounter (Signed)
Patient is calling regarding her medication °

## 2017-03-07 ENCOUNTER — Other Ambulatory Visit: Payer: Self-pay | Admitting: Podiatry

## 2017-03-21 DIAGNOSIS — B372 Candidiasis of skin and nail: Secondary | ICD-10-CM | POA: Diagnosis not present

## 2017-03-21 DIAGNOSIS — L290 Pruritus ani: Secondary | ICD-10-CM | POA: Diagnosis not present

## 2017-03-23 ENCOUNTER — Telehealth: Payer: Self-pay | Admitting: Certified Nurse Midwife

## 2017-03-23 NOTE — Telephone Encounter (Signed)
Spoke with patient. Patient states that she went to Urgent Care over the weekend due to rectal itching. Was checked for hemorrhoids which were not present. Is being treated for a yeast infection. Was given Diflucan 150 mg po x1 and Clotrimazole cream twice daily for 7 days. Took Diflucan on 03/21/2017. Relief with Diflucan and cream given. Asking if she needs an OV. Advised to continue to follow recommendations and return call to the office if symptoms persist or worsen. Patient is agreeable.  Routing to provider for final review. Patient agreeable to disposition. Will close encounter.

## 2017-03-23 NOTE — Telephone Encounter (Signed)
Patient requests a call back from triage nurse in regards to medication prescribed by the urgent care from her visit on Saturday. Please advise.

## 2017-03-25 ENCOUNTER — Telehealth: Payer: Self-pay | Admitting: Certified Nurse Midwife

## 2017-03-25 ENCOUNTER — Other Ambulatory Visit: Payer: Self-pay

## 2017-03-25 ENCOUNTER — Encounter: Payer: Self-pay | Admitting: Certified Nurse Midwife

## 2017-03-25 ENCOUNTER — Ambulatory Visit (INDEPENDENT_AMBULATORY_CARE_PROVIDER_SITE_OTHER): Payer: Medicare Other | Admitting: Certified Nurse Midwife

## 2017-03-25 VITALS — BP 132/90 | HR 70 | Resp 16 | Ht 62.0 in | Wt 207.0 lb

## 2017-03-25 DIAGNOSIS — B372 Candidiasis of skin and nail: Secondary | ICD-10-CM | POA: Diagnosis not present

## 2017-03-25 DIAGNOSIS — R635 Abnormal weight gain: Secondary | ICD-10-CM

## 2017-03-25 NOTE — Telephone Encounter (Signed)
Patient is calling to talk with Theadore Nan about her visit to the urgent care and was treated from hemorrhoids.

## 2017-03-25 NOTE — Progress Notes (Signed)
62 y.o. Single Caucasian female [name] here with complaint of rectal itching that she was treated for at Urgent Care with Clotrimazole with Betamethasone cream which she has not started. She was also given one Diflucan which she has taken 5 days ago and still having some itching. No new personal products of soap, powder or new clothes. Using [employer]. Has not seen Dr. [name] for follow up with diabetes yet( not due yet), but trying to stay on top of her sugar issues.Patient has been eating more bread and less vegetables. Has lost her nurse that was checking in and aide who was helping with grocery shopping.  She had a helper who was suppose to help, but has not been doing her job per patient. Lawyer's Network engineer "fired the other aide who was not cleaning for her". She has someone else, that she has used before with better help. Denies vaginal itching or groin issues with itching or weeping as she has had before. No other concerns.    ROS pertinent to HPI  O:Healthy female WDWN Affect: normal for her/ anxious, orientation x 3  Exam:Skin warm and dry Abdomen:soft, with slight redness under abdominal fold, but no weeping or excoriation or odor  InguinalaLymph nodes: no enlargement or tenderness Pelvic exam: External genital: normal female with right vulva larger than left due to known sarcoidosis site, non tender, no scaling or exudate or redness. Wet prep taken of skin BUS: negative Vagina: scant discharge noted, no odor or redness. Ph:4.0   ,Wet prep taken Cervix:/Uterus surgically absent Adnexa: no masses or fullness noted Rectal area: redness with scaling around anal opening wet prep taken, no rectal bleeding noted   Wet Prep results: KOH, Saline + yeast only   A:Normal pelvic exam History of yeast vulvitis/vaginitis/dermatitis chronic history Type 2 diabetic fair control per patient Recent treatment with Diflucan from Urgent care and Rx for topical not started yet Mentally  challenged Weight gain Social support still up/down   P:Discussed findings of yeast dermatitis around rectal skin area and not vaginally. Discussed importance of good diabetes control to reduce yeast occurrence. Instructed to start Clomtriazole cream with betamethasone am and pm and use Balmex for protection at mid day. Patient repeated instructions back and agreeable to plan. Questions addressed.  Avoid moist clothes or pads for extended period of time. Use cornstarch in folds of skin if needed., no powder. Follow up with PCP as indicated. Warning signs given and need to call.  Rv prn

## 2017-03-25 NOTE — Telephone Encounter (Signed)
Spoke with patient. Was seen at Urgent Care over the weekend and was treated for yeast. Took 1 dose of Diflucan on 03/21/2017. Patient states that her rectal itching is worsening. Has been using Clotrimazole 1% cream twice a day. Was prescribed Clotrimazole and Betamethasone cream, but has not started this. Patient is worried about medication se and symptoms. Requests to be seen by [name]. Appointment scheduled for today at 2 pm. Patient is agreeable to date and time.  Routing to provider for final review. Patient agreeable to disposition. Will close encounter.

## 2017-03-25 NOTE — Patient Instructions (Signed)
Use new medication in morning and at night only

## 2017-03-27 ENCOUNTER — Telehealth: Payer: Self-pay | Admitting: Certified Nurse Midwife

## 2017-03-27 NOTE — Telephone Encounter (Signed)
Spoke with patient. Patient would like to review how she is supposed to use her mediations. Advised to use Clomtriazole cream with betamethasone am and pm x 7 days and use Balmex for protection at mid day. Patient verbalizes understanding and is able to repeat instructions.   Routing to provider for final review. Patient agreeable to disposition. Will close encounter.

## 2017-03-27 NOTE — Telephone Encounter (Signed)
Patient is asking to talk with [facility] about her medication.

## 2017-03-31 ENCOUNTER — Telehealth: Payer: Self-pay | Admitting: Certified Nurse Midwife

## 2017-03-31 IMAGING — CR DG BONE SURVEY MET
10 series · 10 of 10 positions shown · non-contrast
Comparison: None.

CLINICAL DATA: Metastatic cancer.  Evaluate for myeloma lesions.

EXAM:
METASTATIC BONE SURVEY

[w chest pa]
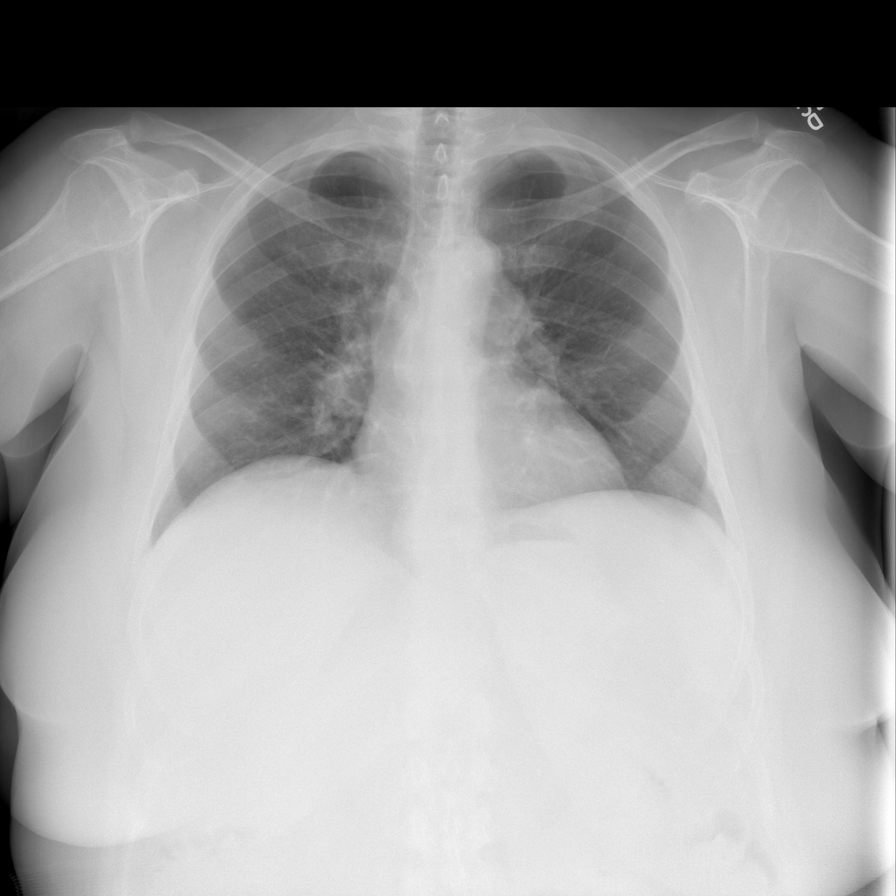

[w c-spine lat *]
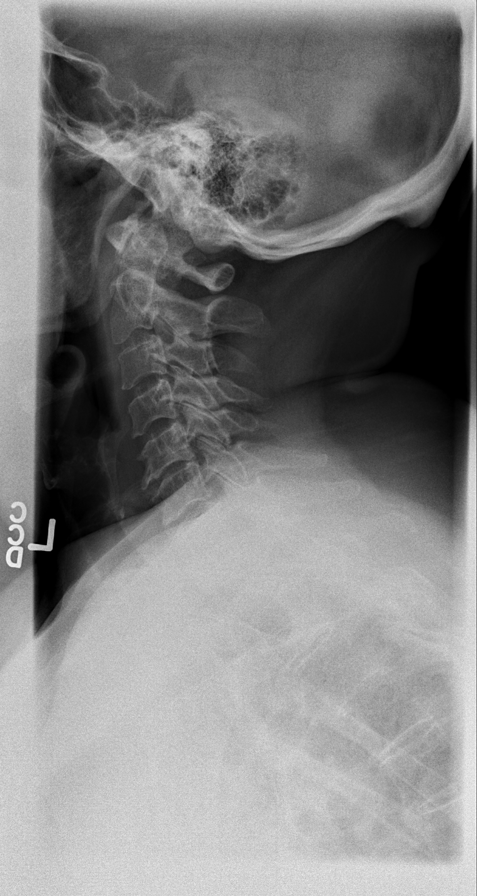

[w swimmers view *]
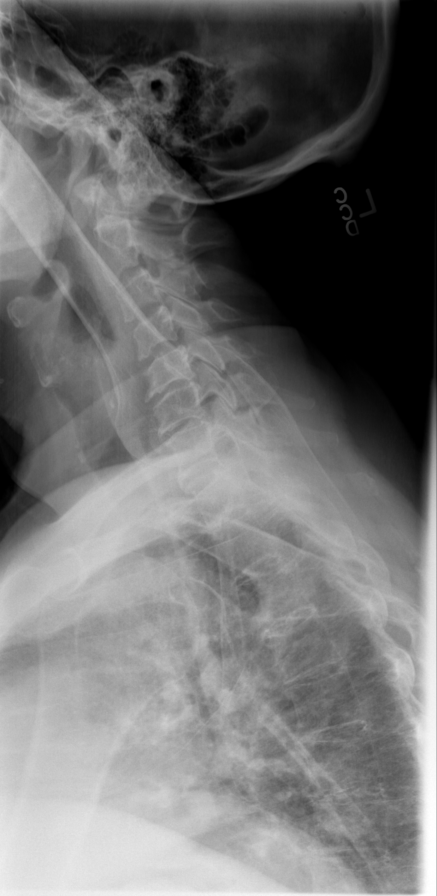

[w skull lat *]
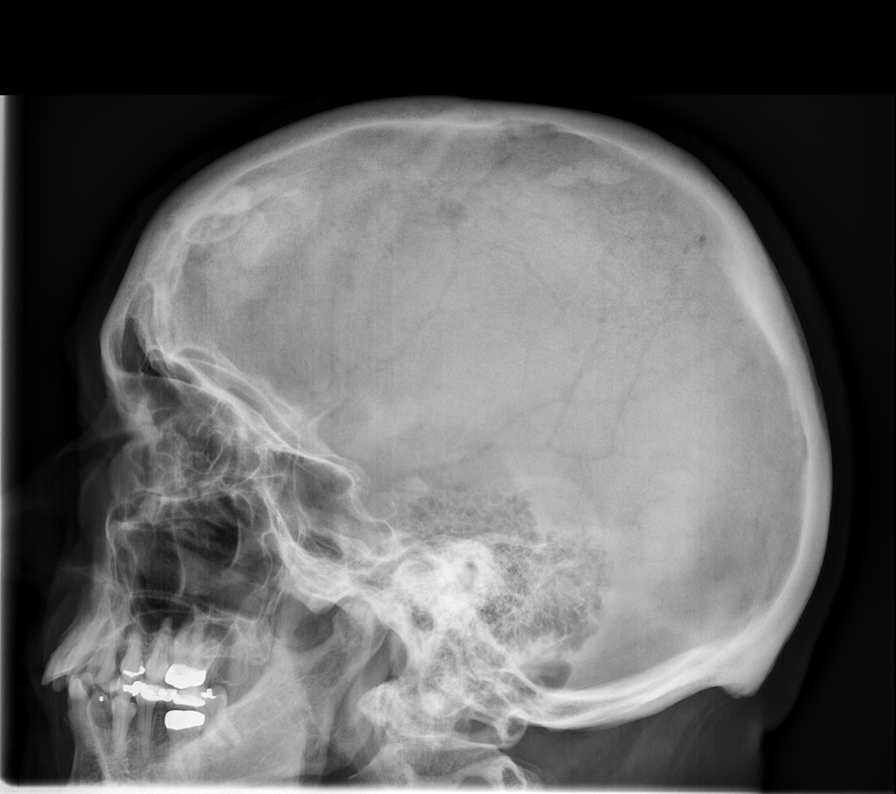

[w forearm ap left *]
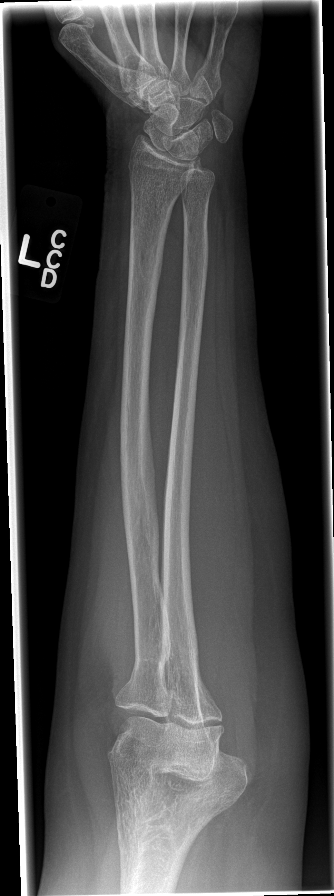

[w forearm ap right]
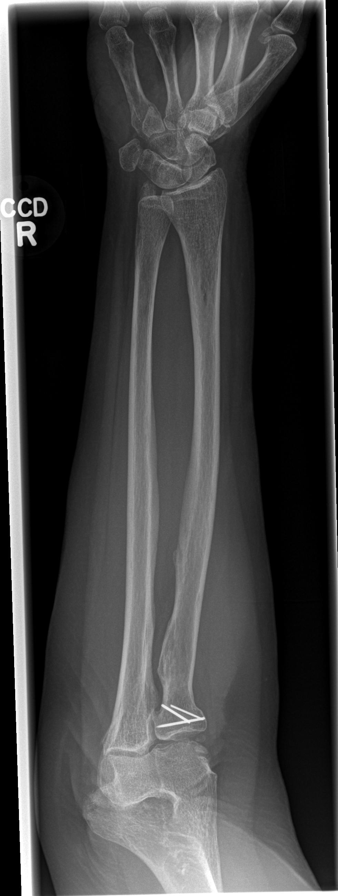

[t c-spine a.p.]
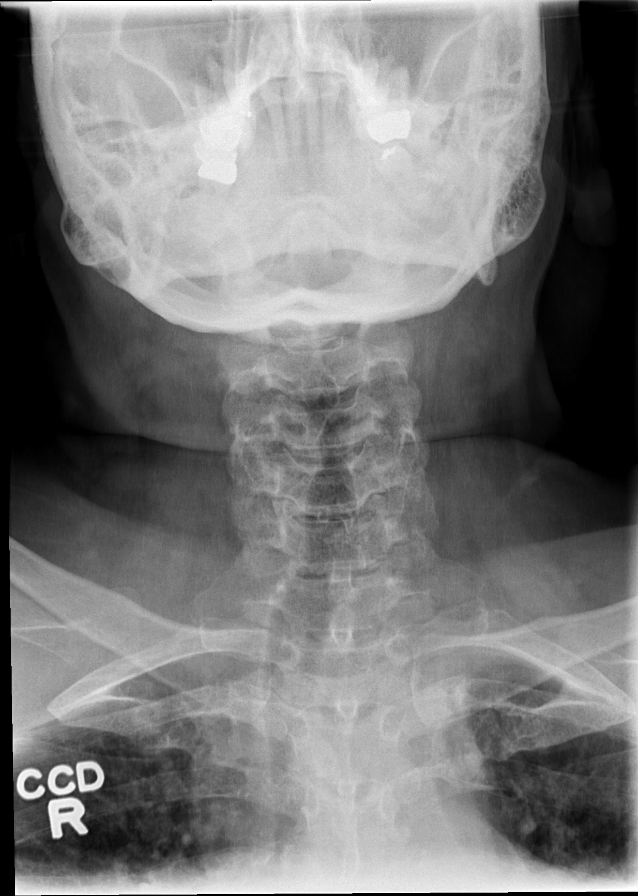

[t t-spine a.p.]
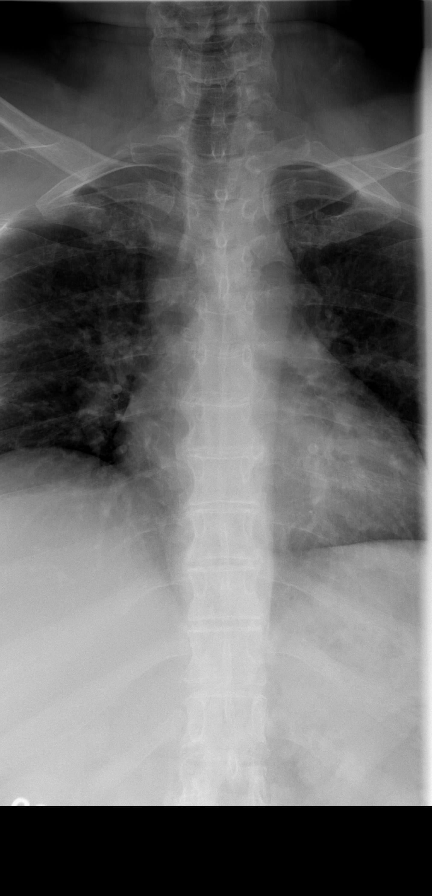

[t l-spine a.p.]
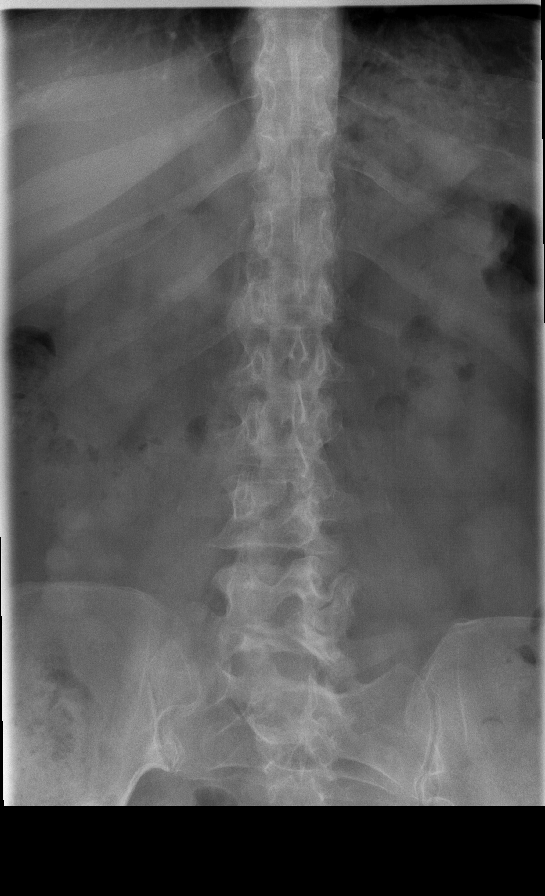

[t pelvis a.p.]
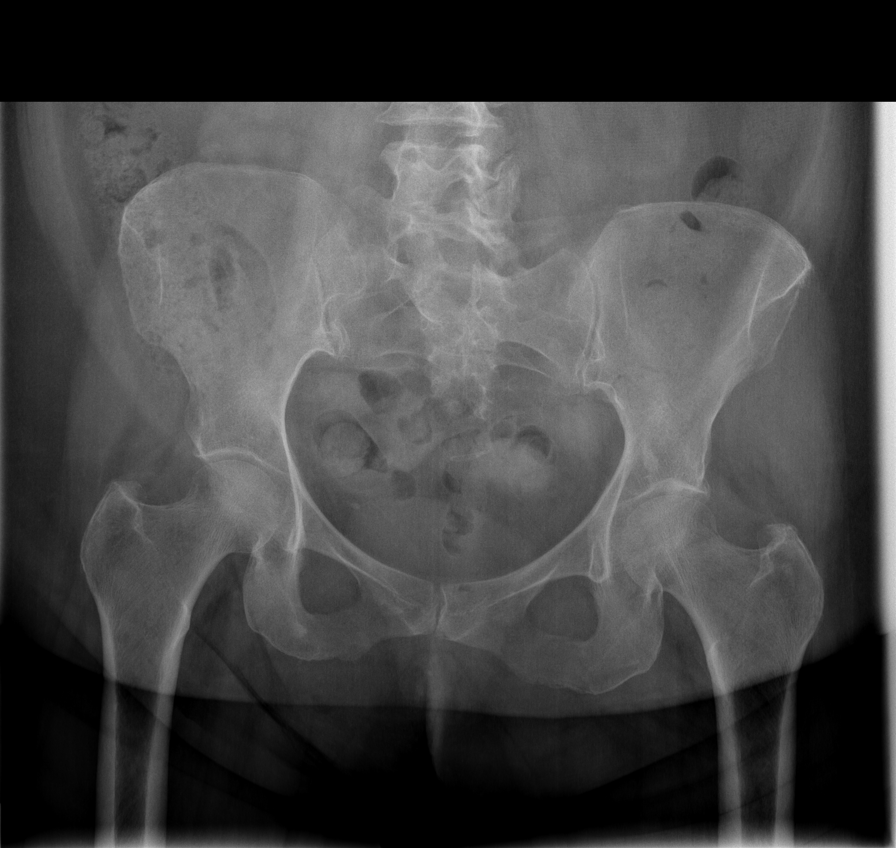

[10 of 10 positions shown; findings below may reference images not displayed]

FINDINGS: Imaging of the axial and appendicular skeleton performed. Small
lucency noted over the parietal region of the skull on single
lateral view. This could represent a prominent vascular channel. A
tiny metastatic focus or myeloma could also present this fashion.
Diffuse osteopenia and degenerative changes are noted. No other
focal abnormalities are identified.
IMPRESSION: 1. Small lucency noted of the parietal region of the skull on single
lateral view. This most likely represents a prominent vascular
lakes. A tiny metastatic focus or myeloma cannot be completely
excluded.

2. Diffuse osteopenia degenerative change. No other focal bony
abnormalities to suggest metastatic disease or myeloma identified.

## 2017-03-31 NOTE — Telephone Encounter (Signed)
She needs does not need to use medication if sweating, just keep dry if possible. If she starts itching or red as we have talked about she will need to come in.  She has gained weight over the past few months and this has made it worse. She needs to stay in her house dress as much as possible and not in pants.

## 2017-03-31 NOTE — Telephone Encounter (Signed)
Return call to patient. Notified of instructions from provider. Encounter closed.

## 2017-03-31 NOTE — Telephone Encounter (Signed)
Spoke with patient. Patient would like to review diet recommendations for diabetics. All questions answered. Advised to increase healthy meats like grilled chicken and fish into her diet along with vegetables. Advised to reduce sugar, starch, and carbs. Advised to call her PCP who manages her diabetes to discuss healthy eating and recommendations. Patient verbalizes understanding. Telephone call with patient 27 minutes.   Routing to provider for final review. Patient agreeable to disposition. Will close encounter.

## 2017-03-31 NOTE — Telephone Encounter (Signed)
Patient has questions about her last visit and what she should be eating.

## 2017-03-31 NOTE — Telephone Encounter (Signed)
Patient would like to speak with nurse about her medication. °

## 2017-03-31 NOTE — Telephone Encounter (Signed)
Return call to patient. Has now noticed "breakin out" under skin fold of abdomen. Asking if she needs to begin Balmex and for how long? Patient states she has been sweating more.  Advised to keep area clean and dry. Apply clean dry cloth between skin folds to prevent skin from remaining damp. Advised not to apply medication until reviewed by French Ana, CNM. We will call her back.

## 2017-04-01 ENCOUNTER — Telehealth: Payer: Self-pay | Admitting: Certified Nurse Midwife

## 2017-04-01 NOTE — Telephone Encounter (Signed)
Spoke with patient. Patient states that the redness under the fold of her abdomen has worsened. Patient is concerned due to appearance of skin. Offered appointment tomorrow but patient declines. Appointment scheduled for 04/03/2017 at 10:00 am. Patient is agreeable to date and time. Advised to keep the area clean, dry, and wear loose fitting clothing. Patient is agreeable.  Routing to provider for final review. Patient agreeable to disposition. Will close encounter.

## 2017-04-01 NOTE — Telephone Encounter (Signed)
Patient called requesting to speak with Verline Lema about her symptoms getting worse "down there." See telephone note from 03/31/17.

## 2017-04-03 ENCOUNTER — Telehealth: Payer: Self-pay | Admitting: Certified Nurse Midwife

## 2017-04-03 ENCOUNTER — Encounter: Payer: Self-pay | Admitting: Certified Nurse Midwife

## 2017-04-03 ENCOUNTER — Ambulatory Visit (INDEPENDENT_AMBULATORY_CARE_PROVIDER_SITE_OTHER): Payer: Medicare Other | Admitting: Certified Nurse Midwife

## 2017-04-03 ENCOUNTER — Other Ambulatory Visit: Payer: Self-pay

## 2017-04-03 VITALS — BP 104/70 | HR 64 | Resp 16 | Ht 62.0 in | Wt 207.0 lb

## 2017-04-03 DIAGNOSIS — B372 Candidiasis of skin and nail: Secondary | ICD-10-CM

## 2017-04-03 DIAGNOSIS — F79 Unspecified intellectual disabilities: Secondary | ICD-10-CM | POA: Diagnosis not present

## 2017-04-03 MED ORDER — FLUCONAZOLE 150 MG PO TABS: ORAL_TABLET | ORAL | 0 refills | Status: DC

## 2017-04-03 NOTE — Telephone Encounter (Signed)
Spoke with patient.  Patient purchased cornstarch today, calling to review instructions from [facility] today, 04/03/17. Reviewed instructions per Melvia Heaps, CNM per OV note. Patient read back instructions.   Patient asking how long to use cornstarch? Advised to use once a day until f/u visit on 3/28 with Melvia Heaps, CNM.   Patient read back instructions.   Routing to provider for final review. Patient is agreeable to disposition. Will close encounter.

## 2017-04-03 NOTE — Progress Notes (Signed)
Subjective:     Patient ID: Linda Thompson, female   DOB: Sep 25, 1955, 62 y.o.   MRN: [id number]  Patient here complaining of itching and rash under "belly" and in creases of legs and under breasts. Some rectal itching again. She has tried her Balmex on rectal area and this feels better. Patient had consistent help at home with personal hygiene and this not the case now. Has someone for cleaning and washing clothes only. Patient showers daily and wears heavier clothing due to weather. Has sweat shirt on today.States she has been not eating sugars, but has been doing brown bread and pasta. Patient prepares her meals. Denies any bleeding with rash area or vaginal bleeding. Denies bladder or bowel issues or blood in stool. Has appointment with Dr. Maudie Mercury PCP in 4/19.     Review of Systems  Constitutional: Negative for activity change, appetite change and fatigue.  Gastrointestinal: Negative for abdominal pain, blood in stool, constipation, diarrhea and rectal pain.       Rectal itching only  Skin: Positive for rash.       Under breast and tummy and groin creases  Neurological: Negative for weakness and headaches.  Psychiatric/Behavioral: Negative for sleep disturbance. The patient is not nervous/anxious.        Mentally challenged       Objective:   Physical Exam  Constitutional: She is oriented to person, place, and time. She appears well-developed and well-nourished.  Abdominal: Soft. She exhibits no distension. There is no tenderness. There is no guarding.  Genitourinary: Rectal exam shows no external hemorrhoid and no fissure.    There is no rash, tenderness or lesion on the right labia. There is no rash, tenderness or lesion on the left labia. No tenderness in the vagina. No vaginal discharge found.  Genitourinary Comments: Atrophic appearance, scant discharge  Lymphadenopathy:       Right: No inguinal adenopathy present.       Left: No inguinal adenopathy present.  Neurological: She is  alert and oriented to person, place, and time.  Skin: Skin is warm. Rash noted. Rash is macular and urticarial. There is erythema.     Perspiring in  Areas of rash and redness  Psychiatric: She has a normal mood and affect. Her behavior is normal. Judgment normal.      wet prep Koh, Saline + yeast only Assessment:     Yeast Dermatitis chronic Type 2 diabetic ? Glucose control on medication ( she takes daily) Weight gain Mentally challenged    Plan:     Discussed finding of yeast in areas of concern and importance of bathing daily. Discussed wearing lighter clothing, to decrease sweating. Patient will try. Discussed wearing bra daily to let area under breast air dry. Discussed obtaining cornstarch to dust under breasts and in groin creases and under her tummy to help with keeping dry. Questions addressed and information written for patient. Discussed eating more vegetables and meats to diet and limiting her pasta which will help with sugar intake. Take her medication as directed. Ask her friend for help as needed. Rx Diflucan see order with instructions and given written to patient. Can apply Balmex to rectal area for itching. Recheck at aex in 2 weeks  Rv prn

## 2017-04-03 NOTE — Telephone Encounter (Signed)
Patient wants to know how is she suppose to use the powder.

## 2017-04-03 NOTE — Progress Notes (Deleted)
62 y.o. Single Caucasian female [name] here with complaint of  symptoms of itching, in groin creases and under abdomen that has increased. Some rectal itching also. Denies bleeding from rectal area or vaginal area.  Onset of symptoms 7 days ago and has continued to increase. Working on diet and snacking. . Denies new personal products or vaginal dryness.  Urinary symptoms no, denies burning except if touches skin.   ROS Pertinent to HPI  O:Healthy female WDWN Affect: normal, orientation x 3  Exam: Abdomen: Lymph node: no enlargement or tenderness Pelvic exam: External genital: normal female BUS: negative Vagina: *** discharge noted. Ph:   ,Wet prep taken, Affirm taken Cervix: normal, non tender, no CMT Uterus: normal, non tender Adnexa:normal, non tender, no masses or fullness noted   Wet Prep results:   A:Normal pelvic exam   P:Discussed findings of *** and etiology. Discussed Aveeno or baking soda sitz bath for comfort. Avoid moist clothes or pads for extended period of time. If working out in gym clothes or swim suits for long periods of time change underwear or bottoms of swimsuit if possible. Coconut Oil use for skin protection prior to activity can be used to external skin for protection or dryness. Rx: ***  Rv prn

## 2017-04-03 NOTE — Patient Instructions (Signed)
You have yeast on skin again today. Follow directions on your bottle for your medication for itching. Apply Balmex in rectal area daily if needed for itching. Buy cornstarch powder at grocery store or pharmacy and put  Shaker container and dust cornstarch under breasts and under tummy area and in between leg creases once daily after skin is dry. Wear lighter clothing for sweating. Wear bra daily.

## 2017-04-09 ENCOUNTER — Ambulatory Visit: Payer: Medicare Other | Admitting: Certified Nurse Midwife

## 2017-04-15 ENCOUNTER — Encounter: Payer: Self-pay | Admitting: Podiatry

## 2017-04-15 ENCOUNTER — Ambulatory Visit (INDEPENDENT_AMBULATORY_CARE_PROVIDER_SITE_OTHER): Payer: Medicare Other | Admitting: Podiatry

## 2017-04-15 DIAGNOSIS — E1140 Type 2 diabetes mellitus with diabetic neuropathy, unspecified: Secondary | ICD-10-CM

## 2017-04-15 DIAGNOSIS — B351 Tinea unguium: Secondary | ICD-10-CM

## 2017-04-15 DIAGNOSIS — M79676 Pain in unspecified toe(s): Secondary | ICD-10-CM | POA: Diagnosis not present

## 2017-04-15 NOTE — Progress Notes (Signed)
Patient ID: Linda Thompson, female   DOB: 04/18/1955, 61 y.o.   MRN: [id number] Complaint:  Visit Type: Patient returns to my office for continued preventative foot care services. Complaint: Patient states" my nails have grown long and thick and become painful to walk and wear shoes" Patient has been diagnosed with DM with no foot complications. The patient presents for preventative foot care services. No changes to ROS.  Patient has pain on the inside border right big toenail.   Podiatric Exam: Vascular: dorsalis pedis and posterior tibial pulses are palpable bilateral. Capillary return is immediate. Temperature gradient is WNL. Skin turgor WNL  Sensorium: Normal Semmes Weinstein monofilament test. Normal tactile sensation bilaterally. Nail Exam: Pt has thick disfigured discolored nails with subungual debris noted bilateral entire nail hallux through fifth toenails Ulcer Exam: There is no evidence of ulcer or pre-ulcerative changes or infection. Orthopedic Exam: Muscle tone and strength are WNL. No limitations in general ROM. No crepitus or effusions noted. Foot type and digits show no abnormalities. Bony prominences are unremarkable. No ankle pain noted. Skin: No Porokeratosis. No infection or ulcers  Diagnosis:  Onychomycosis, , Pain in right toe, pain in left toes  Treatment & Plan Procedures and Treatment: Consent by patient was obtained for treatment procedures. The patient understood the discussion of treatment and procedures well. All questions were answered thoroughly reviewed. Debridement of mycotic and hypertrophic toenails, 1 through 5 bilateral and clearing of subungual debris. No ulceration, no infection noted. . ABN signed for 2019. Return Visit-Office Procedure: Patient instructed to return to the office for a follow up visit 3 months for continued evaluation and treatment.   Jennalynn Rivard DPM 

## 2017-04-16 ENCOUNTER — Ambulatory Visit (INDEPENDENT_AMBULATORY_CARE_PROVIDER_SITE_OTHER): Payer: Medicare Other | Admitting: Certified Nurse Midwife

## 2017-04-16 ENCOUNTER — Encounter: Payer: Self-pay | Admitting: Certified Nurse Midwife

## 2017-04-16 ENCOUNTER — Other Ambulatory Visit: Payer: Self-pay

## 2017-04-16 VITALS — BP 130/88 | HR 70 | Resp 16 | Ht 62.0 in | Wt 203.0 lb

## 2017-04-16 DIAGNOSIS — Z124 Encounter for screening for malignant neoplasm of cervix: Secondary | ICD-10-CM

## 2017-04-16 DIAGNOSIS — N762 Acute vulvitis: Secondary | ICD-10-CM | POA: Diagnosis not present

## 2017-04-16 DIAGNOSIS — Z862 Personal history of diseases of the blood and blood-forming organs and certain disorders involving the immune mechanism: Secondary | ICD-10-CM | POA: Diagnosis not present

## 2017-04-16 DIAGNOSIS — Z01419 Encounter for gynecological examination (general) (routine) without abnormal findings: Secondary | ICD-10-CM

## 2017-04-16 NOTE — Progress Notes (Signed)
62 y.o. [name] Single  Caucasian Fe here for annual exam. Menopausal no HRT. Denies vaginal bleeding or vaginal itching.Has no noted any changes. Used cornstarch powder for groin area moisture since last visit and feels so much better. Took Diflucan as directed for yeast dermatitis and feels better. Bathing daily with shower and going with out underwear at night. Occasional rectal itching and using Balmex only if needed. Has aide who cleans her house twice weekly. Buys her on groceries and has been eating for vegetables and no pasta, feeling better. Has been trying to walk more as we discussed at last visit. Has visit with PCP Dr. Maudie Mercury next week.No medications changes. Recent eye exam "no problems". Feels well, no questions. Had foot care with Podiatrist last week. No Health issues today. Continues to use rolling walker for assistance and still drives.   Patient's last menstrual period was 01/21/1992 (approximate).          Sexually active: No.  The current method of family planning is status post hysterectomy.    Exercising: Yes.    walking Smoker:  no  Health Maintenance: Pap:  01-16-14 neg History of Abnormal Pap: no MMG:  09-24-16 category a density birads 1:neg Self Breast exams: yes Colonoscopy:  2017 negative BMD:   unsure TDaP:  2016 Shingles: 2016 Pneumonia: 2011 Hep C and HIV: Hep c neg 2017, HIV neg 2016 Labs: pcp   reports that she has never smoked. She has never used smokeless tobacco. She reports that she does not drink alcohol or use drugs.  Past Medical History:  Diagnosis Date  . Allergic rhinitis   . Allergy   . Anemia   . Chronic low back pain   . DDD (degenerative disc disease), lumbar   . GERD (gastroesophageal reflux disease)    w/ hx diaphragmatic hernia  . Glaucoma   . GLAUCOMA NOS 03/11/2006   Qualifier: Diagnosis of  By: Diona Browner MD, Amy    . Heart murmur   . Hx of fracture    R arm, foot  . Hyperlipemia   . Hypertension   . IBS (irritable bowel  syndrome)   . Low back pain 11/22/2012  . Meningioma ([place])    ant falx, stable on prior imaging  . Moderate intellectual disabilities   . Murmur   . Obesity   . Osteoarthrosis, unspecified whether generalized or localized, ankle and foot   . Sarcoidosis    difuse bony lesions and LDA, dx by biopsy in 2016  . Transaminitis   . Type II or unspecified type diabetes mellitus without mention of complication, not stated as uncontrolled     Past Surgical History:  Procedure Laterality Date  . CATARACT EXTRACTION Bilateral    Dr Katy Fitch  . COLONOSCOPY  04/15/2011  . ELBOW SURGERY     right elbow  . LIPOMA EXCISION Left [id number]   posterior left axilla  . TOTAL ABDOMINAL HYSTERECTOMY  03/05/1992   leiomyoma and cellular leiomyoma    Current Outpatient Medications  Medication Sig Dispense Refill  . acetaminophen (TYLENOL) 500 MG tablet Take 500 mg by mouth as needed (left ear pain). Reported on 04/18/2015    . Calcium Citrate-Vitamin D (CALCIUM + D PO) Take by mouth.    . diltiazem (CARDIZEM CD) 240 MG 24 hr capsule TAKE ONE CAPSULE BY MOUTH EVERY DAY 90 capsule 2  . ferrous sulfate 325 (65 FE) MG tablet Take 1 tablet (325 mg total) daily with breakfast by mouth. 90 tablet 3  .  fexofenadine (ALLEGRA) 180 MG tablet Take 180 mg by mouth daily.    . fluconazole (DIFLUCAN) 150 MG tablet Take one pill today, take another pill in 7 days and another pill in 7 days ( one pill a week), if not cleared repeat one more week 4 tablet 0  . fluticasone (FLONASE) 50 MCG/ACT nasal spray Place into both nostrils daily.    . hydrochlorothiazide (MICROZIDE) 12.5 MG capsule TAKE 1 CAPSULE BY MOUTH ONCE DAILY 90 capsule 1  . hydrocortisone (ANUSOL-HC) 25 MG suppository UNWRAP AND INSERT 1 SUPPOSITORY IN THE RECTUM TWICE A DAY  1  . hydrocortisone 2.5 % ointment APPLY TO AFFECTED AREA TWICE A DAY  0  . ipratropium (ATROVENT) 0.06 % nasal spray PLACE 2 SPRAYS INTO BOTH NOSTRILS 4 (FOUR) TIMES DAILY. 45 mL 2  .  ketoconazole (NIZORAL) 2 % shampoo APPLY TO AFFECTED AREA TWICE A WEEK AS DIRECTED 120 mL 5  . KLOR-CON M20 20 MEQ tablet TAKE 1 TABLET (20 MEQ TOTAL) BY MOUTH DAILY. 90 tablet 1  . Lactobacillus (FLORAJEN ACIDOPHILUS PO) Take 1 tablet by mouth daily.    Marland Kitchen latanoprost (XALATAN) 0.005 % ophthalmic solution Place 1 drop into both eyes at bedtime.     Marland Kitchen liver oil-zinc oxide (DESITIN) 40 % ointment Apply 1 application topically as needed for irritation.    . Loperamide HCl 1 MG/7.5ML SUSP Take 1 mg by mouth.    . loratadine (CLARITIN) 10 MG tablet Take 10 mg by mouth daily.    . meloxicam (MOBIC) 15 MG tablet TAKE 1 TABLET BY MOUTH EVERY DAY 90 tablet 0  . metFORMIN (GLUCOPHAGE) 500 MG tablet TAKE 1 TABLET BY MOUTH TWICE A DAY WITH A MEAL 180 tablet 1  . omeprazole (PRILOSEC) 20 MG capsule TAKE 1 CAPSULE (20 MG TOTAL) BY MOUTH 2 (TWO) TIMES DAILY. 180 capsule 1  . pravastatin (PRAVACHOL) 40 MG tablet TAKE 1 TABLET BY MOUTH EVERY EVENING 90 tablet 1  . Propylene Glycol (SYSTANE BALANCE) 0.6 % SOLN     . traMADol (ULTRAM) 50 MG tablet Take 50 mg by mouth daily.  0  . UNKNOWN TO PATIENT Over the counter pill for her ears, takes 4 pills twice a day, unsure of name of meds    . Vitamins-Lipotropics ([employer] INNER EAR PLUS PO) Take by mouth.     No current facility-administered medications for this visit.     Family History  Problem Relation Age of Onset  . Diabetes Mother   . Stroke Mother   . Hypertension Mother   . Stroke Father   . Diabetes Maternal Aunt   . Heart disease Maternal Aunt         (had pacemaker)  . Diabetes Maternal Grandmother   . Stroke Maternal Grandmother   . Hypertension Maternal Grandmother   . Colon cancer Neg Hx     ROS:  Pertinent items are noted in HPI.  Otherwise, a comprehensive ROS was negative.  Exam:   BP 130/88   Pulse 70   Resp 16   Ht 5\' 2"  (1.575 m)   Wt 203 lb (92.1 kg)   LMP 01/21/1992 (Approximate)   BMI 37.13 kg/m  Height: 5\' 2"  (157.5 cm) Ht  Readings from Last 3 Encounters:  04/16/17 5\' 2"  (1.575 m)  04/03/17 5\' 2"  (1.575 m)  03/25/17 5\' 2"  (1.575 m)    General appearance: alert, cooperative and appears stated age Head: Normocephalic, without obvious abnormality, atraumatic Neck: no adenopathy, supple, symmetrical, trachea midline  and thyroid normal to inspection and palpation Lungs: clear to auscultation bilaterally Breasts: normal appearance, no masses or tenderness, No nipple retraction or dimpling, No nipple discharge or bleeding, No axillary or supraclavicular adenopathy, pendulous Heart: regular rate and rhythm Abdomen: soft, non-tender; no masses,  no organomegaly Extremities: extremities normal, atraumatic, no cyanosis or edema Skin: Skin color, texture, turgor normal. No rashes or lesions Lymph nodes: Cervical, supraclavicular, and axillary nodes normal. No abnormal inguinal nodes palpated Neurologic: Grossly normal   Pelvic: External genitalia:  no lesions or redness, atrophic appearance. Right vulva no change in mass size(sarcoidosis) Groin area dry and no weeping noted, slight redness only noted on left groin crease              Urethra:  normal appearing urethra with no masses, tenderness or lesions              Bartholin's and Skene's: normal                 Vagina: normal appearing vagina with normal color and discharge, no lesions              Cervix: absent              Pap taken: No. Bimanual Exam:  Uterus:  uterus absent              Adnexa: no mass, fullness, tenderness and adnexa surgically absent               Rectovaginal: Confirms               Anus:  normal sphincter tone, no lesions, no redness or rash noted  Chaperone present: yes  A:  Well Woman with normal exam  Menopausal no HRT  Mentally Challenged with assisted care if needed and guardian  History of chronic yeast dermatitis, clear today   Chronic rectal itching, no issues today in appearance  History of Sarcoidosis,,no change in  primary site of vulva  Diabetic/Hypertension with PCP management  P:   Reviewed health and wellness pertinent to exam  Discussed if vaginal or rectal bleeding needs to call  Continue to use aides as before and advise to attorney if issues  Discussed yeast finding and given printed instructions to use Diflucan oral x 1 as last dose. Continue to use cornstarch to prevent chafing and redness in groin and under breast area  Continue Balmex use in rectal area as needed for itching  Continue follow up with PCP as scheduled  Pap smear: no   counseled on breast self exam, mammography screening, feminine hygiene, adequate intake of calcium and vitamin D, diet and exercise  return annually or prn  An After Visit Summary was printed and given to the patient.

## 2017-04-16 NOTE — Patient Instructions (Signed)
Keep vaginal dry. Use one more pink pill(Diflucan) tomorrow and no more. Continue to use cornstarch powder for sweating and groin skin problems to keep it dry. Use Balmex at rectal area if needed for itching. Exam is  Normal and yeast is almost gone. Lost 4 pounds continue to eat vegetables and lean meat, watch breads. Yogurt daily  Take care  Debbbi

## 2017-04-17 NOTE — Progress Notes (Signed)
HPI:  Using dictation device. Unfortunately this device frequently misinterprets words/phrases.  Linda Thompson is a pleasant 62 y.o. here for follow up. Chronic medical problems summarized below were reviewed for changes. Sees gyn frequently.  Reports she is doing well today.  Denies any complaints.  Reports she saw her podiatrist recently and has some arthritis in her foot.  Reports she takes all of her medicines daily.. Denies CP, SOB, low blood sugar, DOE, treatment intolerance or new symptoms. PAP 12/2013 - must be on 5 year plan as just saw gyn for CPE. Due for foot exam, labs  Diabetes: -meds: metformin -allergy to acei  Obesity, hyperlipidemia, hypertension: -meds: diltiazem, hctz, pravastatin  Moderate intellectual disabilities/Anxiety: -seeing psychiatrist -high level of anxiety about health with frequent visits  -HCPOA: Sue Flippin  Allergic rhinitis: -uses flonase, atrovent and allegra as needed  GERD/irritable bowel syndrome/Gastric polyps: -anemia -seeing GI at [facility] and [place] -meds: low dose PPI, iron Dictation #1 [id number]  ONG:[id number]  Sarcoidosis: -sees pulmonology -Bx 10/03/14 L [place]  - ACE level reported to be 54 ? 01/2015  - cxr 04/24/2015 wnl  Osteoarthritis,multiple sites: -sees Dr. Lorin Mercy -uses tylenol prn  Meningioma: -Repeat MR from 2017 - radiology feels not a meningioma, but rather "Thickening of the anterior falx appears to be related to degenerative ossification and not meningioma." -HCPOA opted not to do repeat imaging with CT for the possible hemangioma and declined further evaluation of this.   ROS: See pertinent positives and negatives per HPI.  Past Medical History:  Diagnosis Date  . Allergic rhinitis   . Allergy   . Anemia   . Chronic low back pain   . DDD (degenerative disc disease), lumbar   . GERD (gastroesophageal reflux disease)    w/ hx diaphragmatic hernia  . Glaucoma   . GLAUCOMA NOS 03/11/2006    Qualifier: Diagnosis of  By: Diona Browner MD, Amy    . Heart murmur   . Hx of fracture    R arm, foot  . Hyperlipemia   . Hypertension   . IBS (irritable bowel syndrome)   . Low back pain 11/22/2012  . Meningioma 12/20/2001   Repeat MR from 2017 - radiology feels not a meningioma, but rather "Thickening of the anterior falx appears to be related to degenerative ossification and not meningioma." HCPOA opted not to do repeat imaging with CT for the possible hemangioma and declined further evaluation of this.  . Meningioma ([place])    ant falx, stable on prior imaging  . Moderate intellectual disabilities   . Murmur   . Obesity   . Osteoarthrosis, unspecified whether generalized or localized, ankle and foot   . Sarcoidosis    difuse bony lesions and LDA, dx by biopsy in 2016  . Transaminitis   . Type II or unspecified type diabetes mellitus without mention of complication, not stated as uncontrolled     Past Surgical History:  Procedure Laterality Date  . CATARACT EXTRACTION Bilateral    Dr Katy Fitch  . COLONOSCOPY  04/15/2011  . ELBOW SURGERY     right elbow  . LIPOMA EXCISION Left [id number]   posterior left axilla  . TOTAL ABDOMINAL HYSTERECTOMY  03/05/1992   leiomyoma and cellular leiomyoma    Family History  Problem Relation Age of Onset  . Diabetes Mother   . Stroke Mother   . Hypertension Mother   . Stroke Father   . Diabetes Maternal Aunt   . Heart disease Maternal Aunt         (  had pacemaker)  . Diabetes Maternal Grandmother   . Stroke Maternal Grandmother   . Hypertension Maternal Grandmother   . Colon cancer Neg Hx     SOCIAL HX: No reported changes   Current Outpatient Medications:  .  Calcium Citrate-Vitamin D (CALCIUM + D PO), Take by mouth., Disp: , Rfl:  .  diltiazem (CARDIZEM CD) 240 MG 24 hr capsule, TAKE ONE CAPSULE BY MOUTH EVERY DAY, Disp: 90 capsule, Rfl: 2 .  ferrous sulfate 325 (65 FE) MG tablet, Take 1 tablet (325 mg total) daily with breakfast by mouth.,  Disp: 90 tablet, Rfl: 3 .  fluticasone (FLONASE) 50 MCG/ACT nasal spray, Place into both nostrils daily., Disp: , Rfl:  .  hydrochlorothiazide (MICROZIDE) 12.5 MG capsule, TAKE 1 CAPSULE BY MOUTH ONCE DAILY, Disp: 90 capsule, Rfl: 1 .  ipratropium (ATROVENT) 0.06 % nasal spray, PLACE 2 SPRAYS INTO BOTH NOSTRILS 4 (FOUR) TIMES DAILY., Disp: 45 mL, Rfl: 2 .  Lactobacillus (FLORAJEN ACIDOPHILUS PO), Take 1 tablet by mouth daily., Disp: , Rfl:  .  latanoprost (XALATAN) 0.005 % ophthalmic solution, Place 1 drop into both eyes at bedtime. , Disp: , Rfl:  .  loratadine (CLARITIN) 10 MG tablet, Take 10 mg by mouth daily as needed for allergies., Disp: , Rfl:  .  metFORMIN (GLUCOPHAGE) 500 MG tablet, TAKE 1 TABLET BY MOUTH TWICE A DAY WITH A MEAL, Disp: 180 tablet, Rfl: 1 .  omeprazole (PRILOSEC) 20 MG capsule, TAKE 1 CAPSULE (20 MG TOTAL) BY MOUTH 2 (TWO) TIMES DAILY., Disp: 180 capsule, Rfl: 1 .  pravastatin (PRAVACHOL) 40 MG tablet, TAKE 1 TABLET BY MOUTH EVERY EVENING, Disp: 90 tablet, Rfl: 1 .  Propylene Glycol (SYSTANE BALANCE) 0.6 % SOLN, , Disp: , Rfl:  .  UNKNOWN TO PATIENT, Over the counter pill for her ears, takes 4 pills twice a day, unsure of name of meds, Disp: , Rfl:  .  Vitamins-Lipotropics ([employer] INNER EAR PLUS PO), Take by mouth., Disp: , Rfl:   EXAM:  Vitals:   04/20/17 1005  BP: 126/84  Pulse: 93  Temp: 97.7 F (36.5 C)    Body mass index is 37.55 kg/m.  GENERAL: vitals reviewed and listed above, alert, oriented, appears well hydrated and in no acute distress  HEENT: atraumatic, conjunttiva clear, no obvious abnormalities on inspection of external nose and ears  NECK: no obvious masses on inspection  LUNGS: clear to auscultation bilaterally, no wheezes, rales or rhonchi, good air movement  CV: HRRR, no peripheral edema  MS: moves all extremities without noticeable abnormality  PSYCH: pleasant and cooperative, no obvious depression or anxiety  ASSESSMENT AND  PLAN:  Discussed the following assessment and plan:  Type 2 diabetes mellitus without complication, without long-term current use of insulin (HCC)  Hypertension associated with diabetes ([place])  Hyperlipidemia associated with type 2 diabetes mellitus (HCC)  Morbid obesity (HCC)  Gastroesophageal reflux disease, esophagitis presence not specified  -Reviewed medication list at length with patient and updated, copies provided to patient -Labs today -Foot exam done -Lifestyle recommendations -Follow-up 3-4 months  Patient Instructions  BEFORE YOU LEAVE: -labs (please add lipid panel if she is fasting) -print out medication list, 2 copies for patient -follow up: 3 months  We have ordered labs or studies at this visit. It can take up to 1-2 weeks for results and processing. IF results require follow up or explanation, we will call you with instructions. Clinically stable results will be released to your [facility]. If you  have not heard from [place] or cannot find your results in [facility] in 2 weeks please contact our office at [phone].  If you are not yet signed up for [facility], please consider signing up.   We recommend the following healthy lifestyle for LIFE: 1) Small portions. But, make sure to get regular (at least 3 per day), healthy meals and small healthy snacks if needed.  2) Eat a healthy clean diet.   TRY TO EAT: -at least 5-7 servings of low sugar, colorful, and nutrient rich vegetables per day (not corn, potatoes or bananas.) -berries are the best choice if you wish to eat fruit (only eat small amounts if trying to reduce weight)  -lean meets (fish, white meat of chicken or [place]) -vegan proteins for some meals - beans or tofu, whole grains, nuts and seeds -Replace bad fats with good fats - good fats include: fish, nuts and seeds, canola oil, olive oil -small amounts of low fat or non fat dairy -small amounts of100 % whole grains - check the lables -drink plenty of  water  AVOID: -SUGAR, sweets, anything with added sugar, corn syrup or sweeteners - must read labels as even foods advertised as "healthy" often are loaded with sugar -if you must have a sweetener, small amounts of stevia may be best -sweetened beverages and artificially sweetened beverages -simple starches (rice, bread, potatoes, pasta, chips, etc - small amounts of 100% whole grains are ok) -red meat, pork, butter -fried foods, fast food, processed food, excessive dairy, eggs and coconut.  3)Get at least 150 minutes of sweaty aerobic exercise per week.  4)Reduce stress - consider counseling, meditation and relaxation to balance other aspects of your life.          [name], DO

## 2017-04-20 ENCOUNTER — Other Ambulatory Visit: Payer: Self-pay | Admitting: Family Medicine

## 2017-04-20 ENCOUNTER — Encounter: Payer: Self-pay | Admitting: Family Medicine

## 2017-04-20 ENCOUNTER — Ambulatory Visit (INDEPENDENT_AMBULATORY_CARE_PROVIDER_SITE_OTHER): Payer: Medicare Other | Admitting: Family Medicine

## 2017-04-20 VITALS — BP 126/84 | HR 93 | Temp 97.7°F | Ht 62.0 in | Wt 205.3 lb

## 2017-04-20 DIAGNOSIS — E1159 Type 2 diabetes mellitus with other circulatory complications: Secondary | ICD-10-CM | POA: Diagnosis not present

## 2017-04-20 DIAGNOSIS — I152 Hypertension secondary to endocrine disorders: Secondary | ICD-10-CM | POA: Insufficient documentation

## 2017-04-20 DIAGNOSIS — E1169 Type 2 diabetes mellitus with other specified complication: Secondary | ICD-10-CM

## 2017-04-20 DIAGNOSIS — K219 Gastro-esophageal reflux disease without esophagitis: Secondary | ICD-10-CM

## 2017-04-20 DIAGNOSIS — I10 Essential (primary) hypertension: Secondary | ICD-10-CM | POA: Diagnosis not present

## 2017-04-20 DIAGNOSIS — E119 Type 2 diabetes mellitus without complications: Secondary | ICD-10-CM

## 2017-04-20 DIAGNOSIS — E785 Hyperlipidemia, unspecified: Secondary | ICD-10-CM

## 2017-04-20 DIAGNOSIS — E782 Mixed hyperlipidemia: Secondary | ICD-10-CM | POA: Insufficient documentation

## 2017-04-20 LAB — CBC
HEMATOCRIT: 47.4 % — AB (ref 36.0–46.0)
HEMOGLOBIN: 15.7 g/dL — AB (ref 12.0–15.0)
MCHC: 33.1 g/dL (ref 30.0–36.0)
MCV: 91.2 fl (ref 78.0–100.0)
Platelets: 307 10*3/uL (ref 150.0–400.0)
RBC: 5.2 Mil/uL — AB (ref 3.87–5.11)
RDW: 13.9 % (ref 11.5–15.5)
WBC: 6.9 10*3/uL (ref 4.0–10.5)

## 2017-04-20 LAB — BASIC METABOLIC PANEL
BUN: 19 mg/dL (ref 6–23)
CHLORIDE: 103 meq/L (ref 96–112)
CO2: 31 meq/L (ref 19–32)
Calcium: 9.3 mg/dL (ref 8.4–10.5)
Creatinine, Ser: 0.55 mg/dL (ref 0.40–1.20)
GFR: 118.76 mL/min (ref >60.00)
Glucose, Bld: 136 mg/dL — ABNORMAL HIGH (ref 70–99)
POTASSIUM: 4.2 meq/L (ref 3.5–5.1)
Sodium: 142 mEq/L (ref 135–145)

## 2017-04-20 LAB — HEMOGLOBIN A1C: Hgb A1c MFr Bld: 7.4 % — ABNORMAL HIGH (ref 4.6–6.5)

## 2017-04-20 NOTE — Patient Instructions (Addendum)
BEFORE YOU LEAVE: -labs (please add lipid panel if she is fasting) -print out medication list, 2 copies for patient -follow up: 3 months  We have ordered labs or studies at this visit. It can take up to 1-2 weeks for results and processing. IF results require follow up or explanation, we will call you with instructions. Clinically stable results will be released to your [facility]. If you have not heard from [place] or cannot find your results in [facility] in 2 weeks please contact our office at [phone].  If you are not yet signed up for [facility], please consider signing up.   We recommend the following healthy lifestyle for LIFE: 1) Small portions. But, make sure to get regular (at least 3 per day), healthy meals and small healthy snacks if needed.  2) Eat a healthy clean diet.   TRY TO EAT: -at least 5-7 servings of low sugar, colorful, and nutrient rich vegetables per day (not corn, potatoes or bananas.) -berries are the best choice if you wish to eat fruit (only eat small amounts if trying to reduce weight)  -lean meets (fish, white meat of chicken or [place]) -vegan proteins for some meals - beans or tofu, whole grains, nuts and seeds -Replace bad fats with good fats - good fats include: fish, nuts and seeds, canola oil, olive oil -small amounts of low fat or non fat dairy -small amounts of100 % whole grains - check the lables -drink plenty of water  AVOID: -SUGAR, sweets, anything with added sugar, corn syrup or sweeteners - must read labels as even foods advertised as "healthy" often are loaded with sugar -if you must have a sweetener, small amounts of stevia may be best -sweetened beverages and artificially sweetened beverages -simple starches (rice, bread, potatoes, pasta, chips, etc - small amounts of 100% whole grains are ok) -red meat, pork, butter -fried foods, fast food, processed food, excessive dairy, eggs and coconut.  3)Get at least 150 minutes of sweaty aerobic exercise  per week.  4)Reduce stress - consider counseling, meditation and relaxation to balance other aspects of your life.

## 2017-04-21 ENCOUNTER — Telehealth: Payer: Self-pay | Admitting: Family Medicine

## 2017-04-21 ENCOUNTER — Telehealth: Payer: Self-pay | Admitting: Certified Nurse Midwife

## 2017-04-21 NOTE — Telephone Encounter (Signed)
She should be using cornstarch per my note, not baking soda

## 2017-04-21 NOTE — Telephone Encounter (Signed)
Spoke with patient. Patient is asking if she can use baking soda powder under her breast. Feels the area is sweating more and worried it may cause skin breakdown. Denies any skin breakdown at this time. Advised per AEX note on 3/28 she may use baking soda powder under her breast and on her groin to keep the area dry. Patient verbalizes understanding.  Routing to provider for final review. Patient agreeable to disposition. Will close encounter.

## 2017-04-21 NOTE — Telephone Encounter (Signed)
Pt walked in office today, had question about Klor Con, should she be taking this? Per Dr. Maudie Mercury, if patient has been taking at home then to continue Klor con 20 meq take 1 tablet every day. I gave patient this information, wrote on her paper medication list and updated in Epic.

## 2017-04-21 NOTE — Telephone Encounter (Signed)
Patient is breaking out under her left breast.

## 2017-04-21 NOTE — Telephone Encounter (Signed)
For review, please.

## 2017-04-22 ENCOUNTER — Other Ambulatory Visit: Payer: Self-pay | Admitting: Family Medicine

## 2017-04-22 NOTE — Telephone Encounter (Signed)
I apologize. Patient is using cornstarch powder. Reviewed this with patient on 04/21/2017.

## 2017-04-23 ENCOUNTER — Telehealth: Payer: Self-pay | Admitting: Certified Nurse Midwife

## 2017-04-23 MED ORDER — METFORMIN HCL 500 MG PO TABS: ORAL_TABLET | ORAL | 5 refills | Status: DC

## 2017-04-23 NOTE — Addendum Note (Signed)
Addended by: Agnes Lawrence on: 04/23/2017 04:15 PM   Modules accepted: Orders

## 2017-04-23 NOTE — Telephone Encounter (Signed)
Patient would like to speak with nurse. Says she started using cornstarch under breasts and it broke her out, but she believes it is helping.

## 2017-04-23 NOTE — Telephone Encounter (Signed)
Spoke with patient. Patient states that she noticed the area under her breast was irritated and sweating. Put cornstarch powder under her breasts last night and feels it is getting better. Asking if she can keep putting cornstarch powder under her breasts. Advised okay to continue. Patient verbalizes understanding.  Routing to provider for final review. Patient agreeable to disposition. Will close encounter.

## 2017-04-24 ENCOUNTER — Telehealth: Payer: Self-pay | Admitting: Certified Nurse Midwife

## 2017-04-24 NOTE — Telephone Encounter (Signed)
Called patient and NA.

## 2017-04-24 NOTE — Telephone Encounter (Signed)
Spoke to patient. Pt states she has been sweating & having some irritation in the groin area on the left. Pt states she is taking showers daily & puts the cornstarch on at night. Pt to take a shower in the morning & once she is try really well for her to put some cornstarch on then & also at nignt. Pt to callback on Monday if not better. Pt states she has 1 diflucan left. Pt aware to wait before she takes it & to do the cornstarch in the morning & see if they helps. Pt understands directions on what she needs to do. Pt thinks this is a good plan because her cornstarch isnt on that good during the day when she is only putting it on at night.

## 2017-04-24 NOTE — Telephone Encounter (Signed)
Patient called requesting to speak with [facility] about some vaginal irritation she is experiencing.

## 2017-04-28 NOTE — Telephone Encounter (Signed)
Patient called requesting to speak with Linda Thompson about her symptoms.

## 2017-04-28 NOTE — Telephone Encounter (Signed)
Spoke with patient. Patient states that she has been applying cornstarch powder twice a day. Has been having sweating and some irritation to her right groin area. Feels this is due to her depends and air conditioner breaking last week. Air conditioner is now fixed. Advised only to wear depends as needed. When she is at home needs to wear lose fitting clothing without depends or bra to let air to get these areas. Patient states she feels the area has gotten better since using cornstarch twice a day. Advised to continue use and to ensure she is bathing daily and keeping the area clean and dry. Patient is agreeable.  Routing to provider for final review. Patient agreeable to disposition. Will close encounter.

## 2017-04-29 ENCOUNTER — Telehealth: Payer: Self-pay | Admitting: Certified Nurse Midwife

## 2017-04-29 NOTE — Telephone Encounter (Signed)
Spoke with patient. Patient states that she has been using cornstarch under her breasts and stomach on the left side. Has noticed the right side under her stomach is sweating and becoming irritated. Asking if it is okay to use cornstarch in that area. Advised may use cornstarch under the right side of her stomach as well. Advised to use twice daily. Needs to bathe daily and keep the area clean and dry. Patient verbalizes understanding.  Routing to provider for final review. Patient agreeable to disposition. Will close encounter.

## 2017-04-29 NOTE — Telephone Encounter (Signed)
Patient stated that she has been using cornstarch as directed, and she has broken out on her stomach.

## 2017-05-04 ENCOUNTER — Telehealth: Payer: Self-pay | Admitting: Family Medicine

## 2017-05-04 NOTE — Telephone Encounter (Signed)
I called the pt and informed her of the message below

## 2017-05-04 NOTE — Telephone Encounter (Signed)
Pt walked in office today for advice. She recently on 04/24/2017 had increase in Metformin 500 mg take 2 in AM and 1 in PM. She had diarrhea on 3 different days last Monday, Friday, and Sunday only 1 time each day, she spoke with pharmacy and was told most likely from starting on increase of Metformin. She is eating before taking medication and drinking plenty of water, would like further advice please?

## 2017-05-04 NOTE — Telephone Encounter (Signed)
Would continue the metformin. imodium if needed. Follow up with me if persists over next few weeks. She should see her gastroenterologist if worsens or other GI symptoms.

## 2017-05-05 ENCOUNTER — Other Ambulatory Visit: Payer: Self-pay

## 2017-05-05 ENCOUNTER — Ambulatory Visit (INDEPENDENT_AMBULATORY_CARE_PROVIDER_SITE_OTHER): Payer: Medicare Other | Admitting: Certified Nurse Midwife

## 2017-05-05 ENCOUNTER — Telehealth: Payer: Self-pay | Admitting: Certified Nurse Midwife

## 2017-05-05 ENCOUNTER — Encounter: Payer: Self-pay | Admitting: Certified Nurse Midwife

## 2017-05-05 VITALS — BP 110/70 | HR 68 | Resp 16 | Ht 62.0 in | Wt 204.0 lb

## 2017-05-05 DIAGNOSIS — B9689 Other specified bacterial agents as the cause of diseases classified elsewhere: Secondary | ICD-10-CM

## 2017-05-05 DIAGNOSIS — Z8639 Personal history of other endocrine, nutritional and metabolic disease: Secondary | ICD-10-CM | POA: Diagnosis not present

## 2017-05-05 DIAGNOSIS — N952 Postmenopausal atrophic vaginitis: Secondary | ICD-10-CM | POA: Diagnosis not present

## 2017-05-05 DIAGNOSIS — R300 Dysuria: Secondary | ICD-10-CM | POA: Diagnosis not present

## 2017-05-05 DIAGNOSIS — N760 Acute vaginitis: Secondary | ICD-10-CM

## 2017-05-05 MED ORDER — METRONIDAZOLE 0.75 % VA GEL: VAGINAL | 0 refills | Status: DC

## 2017-05-05 NOTE — Progress Notes (Signed)
62 y.o. Single Caucasian female [name] here with complaint of vaginal symptoms of burning when urine touches skin. Denies any vaginal discharge increase. Denies urinary frequency, urgency or pain. No urine odor. No sure having pain with urination, just hurts " pointing to vaginal area when it touches there". Has been using coconut oil vaginally as instructed for itching and dryness. Denies vaginal bleeding. Onset of symptoms 2 days ago. No wearing depends for long periods of time. No incontinence. Denies new personal products. Bathing once daily. Using cornstarch in groin creases, under abdomen and under breast with good response. Denies bowel issues. Has new top on she  received from neighbor. Still has someone cleaning for her. Saw [name] PCP and blood sugar was elevated and changed her medication. "Feels OK since change.   Review of Systems  Constitutional: Negative for chills and fever.  Gastrointestinal: Negative for abdominal pain, constipation, diarrhea, nausea and vomiting.  Genitourinary: Negative for dysuria, frequency, hematuria and urgency.       Urine burns only when touches "skin down there only". It is the skin only. No urine odor.  Neurological: Negative for weakness and headaches.    O:Healthy female WDWN Affect: normal, orientation x 3  Exam: Skin: warm and dry, no areas of broken skin or weeping noted CVAT: bilateral negative Abdomen: soft, non tender, negative suprapubic  Inguinal Lymph nodes: no enlargement or tenderness Pelvic exam: External genital: normal female, no lesions, right vulva still enlarged from Sarcoidosis site, but no change in appearance or size, non tender BUS: negative Bladder, Urethral meatus not red, non tender Vagina: scant odorous discharge noted. Ph:5.0   ,Wet prep taken, some redness noted at entrance to vagina only, "points to this area for burning". No lesions or broken skin Cervix: absent Uterus: absent Adnexa:absent  Wet Prep  results:KOH, Saline + for clue cells only, no yeast noted   A:Normal pelvic exam Menopausal TAH with BSO BV R/O UTI Mentally challenged lives independently History of sarcoidosis, vulva primary site,no change in appearance   P:Discussed findings of BV and etiology with patient. Given instructions for use of gel. Patient repeated instructions back correctly. Discussed to have pharmacy also show her the medication and questions as needed. Discussed this is the reason she is burning when urine touches skin. Can use water to pour skin to stop burning during urination if needed. Given warning signs of UTI and need to call if occurs. Continue good water intake and follow diet. Rx Metrogel  See order with instructions Lab: Urine culture   Rv prn

## 2017-05-05 NOTE — Patient Instructions (Signed)
Put one applicator full of medicine in vagina in the morning and one at bedtime for 5 days. You will have some medicine come out after use. This is normal.

## 2017-05-05 NOTE — Telephone Encounter (Signed)
Spoke with patient. Patient states she is having vaginal irritation and swelling. Reports vaginal itching on inside of vagina. Having burning with urination. Unable to tell if the area is red in color. Requesting an appointment with [name] before the holiday. Appointment scheduled for today at 10 am with [name]. Patient is agreeable to date and time.   Routing to provider for final review. Patient agreeable to disposition. Will close encounter.

## 2017-05-05 NOTE — Telephone Encounter (Signed)
Patient is asking to talk with [facility]. Patient asked if Verline Lema could call her ASAP.

## 2017-05-06 ENCOUNTER — Telehealth: Payer: Self-pay | Admitting: Certified Nurse Midwife

## 2017-05-06 LAB — URINE CULTURE

## 2017-05-06 NOTE — Telephone Encounter (Signed)
Patient has a question about taking her medication.

## 2017-05-06 NOTE — Telephone Encounter (Signed)
Spoke with patient. Patient states that she picked up rx for Metrogel yesterday and was given instructions on how to use by the pharmacist. Patient verbalizes understanding of use. Asking how long to use the medication if she used it once yesterday. Advised to use twice a day through Saturday and use once Sunday morning. Patient verbalizes understanding. Has 5 applicators asking if she can use 1 more than once. Advised may use applicator twice. Needs to clean with warm water before using again. After using twice needs to throw it away and use a new applicator. Patient verbalizes understanding.  Routing to provider for final review. Patient agreeable to disposition. Will close encounter.

## 2017-05-11 ENCOUNTER — Telehealth: Payer: Self-pay | Admitting: Certified Nurse Midwife

## 2017-05-11 NOTE — Telephone Encounter (Signed)
Spoke with patient. Patient reports she has finished gel medication (metrogel) for vagina, burning has gone away. Patient asking what she was treated for? Advised DX Bacterial vaginosis, questions answered. Advised patient to return call to office if symptoms return or new symptoms develop. Patient verbalizes understanding.   Routing to provider for final review. Patient is agreeable to disposition. Will close encounter.

## 2017-05-11 NOTE — Telephone Encounter (Signed)
Patient is asking to talk with [facility].

## 2017-05-22 ENCOUNTER — Other Ambulatory Visit: Payer: Self-pay | Admitting: Podiatry

## 2017-05-29 ENCOUNTER — Ambulatory Visit (INDEPENDENT_AMBULATORY_CARE_PROVIDER_SITE_OTHER): Payer: Medicare Other | Admitting: Physician Assistant

## 2017-05-29 ENCOUNTER — Encounter: Payer: Self-pay | Admitting: Physician Assistant

## 2017-05-29 VITALS — BP 118/74 | HR 82 | Ht 62.0 in | Wt 202.1 lb

## 2017-05-29 DIAGNOSIS — K219 Gastro-esophageal reflux disease without esophagitis: Secondary | ICD-10-CM | POA: Diagnosis not present

## 2017-05-29 DIAGNOSIS — K317 Polyp of stomach and duodenum: Secondary | ICD-10-CM | POA: Diagnosis not present

## 2017-05-29 DIAGNOSIS — D132 Benign neoplasm of duodenum: Secondary | ICD-10-CM

## 2017-05-29 DIAGNOSIS — R197 Diarrhea, unspecified: Secondary | ICD-10-CM | POA: Diagnosis not present

## 2017-05-29 NOTE — Progress Notes (Signed)
Subjective:    Patient ID: Linda Thompson, female    DOB: 1955/02/28, 62 y.o.   MRN: [id number]  HPI Linda Thompson is a pleasant 62 year old white female known to Dr. Havery Moros.  She has history of hypertension, adult onset diabetes mellitus, obesity, moderate intellectual disability, sarcoidosis, hyperlipidemia, IBS and chronic GERD. Patient is also had adenomatous colon polyps and multiple large gastric polyps.  She has had primarily hyperplastic gastric polyps but did have one large hyperplastic polyp previously which contained focal high-grade dysplasia.  At the time of last EGD in October 2018 she also had an adenomatous polyp removed from the duodenum.  Her last EGD was done by Dr. Arsenio Loader at [facility] in October 2018 with 3 large polyps removed which were all hyperplastic also had removal of the duodenal tubular adenoma.  Patient believes she is to follow-up with Dr. Arsenio Loader in 1 year. Last colonoscopy March 2017 with removal of 4 adenomatous polyps and she is to have follow-up in March 2020.    Patient comes in today with complaints of diarrhea.  She had been having normal bowel movements until she had dose of metformin increased by her PCP Dr. Maudie Mercury several weeks ago.  She is now taking at thousand milligrams every morning and 500 mg every afternoon.  Shortly after increasing the dose of metformin she started having episodes of diarrhea.  She called PCP office and was told to get Imodium to use on an as-needed basis and that if her diarrhea persisted to be seen by GI. Her last episode of diarrhea was about 5 days ago with one voluminous diarrheal stool.  She took one Imodium and has not been having any diarrhea since.  She has 1-2 soft bowel movements per day.  She has developed some increased gas as well.  No melena or hematochezia.  She denies any abdominal pain.  She has remained on omeprazole 20 mg once daily. She has not been placed on any other new medications or antibiotics etc.  Review of  Systems Pertinent positive and negative review of systems were noted in the above HPI section.  All other review of systems was otherwise negative.  Outpatient Encounter Medications as of 05/29/2017  Medication Sig  . Calcium Citrate-Vitamin D (CALCIUM + D PO) Take by mouth.  . diltiazem (CARDIZEM CD) 240 MG 24 hr capsule TAKE ONE CAPSULE BY MOUTH EVERY DAY  . ferrous sulfate 325 (65 FE) MG tablet Take 1 tablet (325 mg total) daily with breakfast by mouth.  . fluticasone (FLONASE) 50 MCG/ACT nasal spray Place into both nostrils daily.  . hydrochlorothiazide (MICROZIDE) 12.5 MG capsule TAKE 1 CAPSULE BY MOUTH ONCE DAILY  . ipratropium (ATROVENT) 0.06 % nasal spray PLACE 2 SPRAYS INTO BOTH NOSTRILS 4 (FOUR) TIMES DAILY.  Marland Kitchen KLOR-CON M20 20 MEQ tablet TAKE 1 TABLET (20 MEQ TOTAL) BY MOUTH DAILY.  Marland Kitchen latanoprost (XALATAN) 0.005 % ophthalmic solution Place 1 drop into both eyes at bedtime.   Marland Kitchen loratadine (CLARITIN) 10 MG tablet Take 10 mg by mouth daily as needed for allergies.  . meloxicam (MOBIC) 15 MG tablet TAKE 1 TABLET BY MOUTH EVERY DAY  . metFORMIN (GLUCOPHAGE) 500 MG tablet Take 2 tablets by mouth every morning and 1 every evening  . metroNIDAZOLE (METROGEL) 0.75 % vaginal gel Insert one applicator full in vagina in morning and at bedtime for 5 days  . omeprazole (PRILOSEC) 20 MG capsule TAKE 1 CAPSULE (20 MG TOTAL) BY MOUTH 2 (TWO) TIMES DAILY. (Patient taking  differently: TAKE 1 CAPSULE (20 MG TOTAL) BY MOUTH 2 ONE TIME DAILY.)  . pravastatin (PRAVACHOL) 40 MG tablet TAKE 1 TABLET BY MOUTH EVERY EVENING  . Propylene Glycol (SYSTANE BALANCE) 0.6 % SOLN   . Vitamins-Lipotropics ([employer] INNER EAR PLUS PO) Take by mouth.  . [DISCONTINUED] Lactobacillus (FLORAJEN ACIDOPHILUS PO) Take 1 tablet by mouth daily.  . [DISCONTINUED] Calcium Carbonate-Vitamin D (CALCIUM-VITAMIN D) 500-200 MG-UNIT per tablet Take 1 tablet by mouth 2 (two) times daily with a meal.    . [DISCONTINUED] diltiazem (TIAZAC) 240 MG  24 hr capsule Take 1 capsule (240 mg total) by mouth daily.   No facility-administered encounter medications on file as of 05/29/2017.    Allergies  Allergen Reactions  . Ace Inhibitors Cough  . Amoxicillin-Pot Clavulanate Diarrhea  . Augmentin [Amoxicillin-Pot Clavulanate] Other (See Comments)    diarrhea  . Azithromycin Diarrhea  . Ceftin [Cefuroxime Axetil]     diarrhea  . Cefuroxime Diarrhea and Other (See Comments)    Generalized weakness  . Cyclobenzaprine Hcl Diarrhea  . Flexeril [Cyclobenzaprine] Diarrhea  . Flonase [Fluticasone Propionate] Other (See Comments)    Nose bleed  . Fluticasone Other (See Comments)    Nose bleed  . Ibuprofen Other (See Comments)    REACTION: vomiting   Patient Active Problem List   Diagnosis Date Noted  . Hypertension associated with diabetes ([place]) 04/20/2017  . Hyperlipidemia associated with type 2 diabetes mellitus ([place]) 04/20/2017  . Murmur   . Arthritis 12/03/2015  . Multiple gastric polyps 08/30/2015  . Sarcoidosis 10/27/2014  . Morbid obesity ([place]) 05/05/2011  . Irritable bowel syndrome 03/05/2010  . Diabetes ([place]) 08/13/2007  . Allergic rhinitis 09/23/2006  . Moderate intellectual disabilities 03/11/2006  . Unspecified glaucoma 03/11/2006  . Gastroesophageal reflux disease 03/11/2006   Social History   Socioeconomic History  . Marital status: Single    Spouse name: Not on file  . Number of children: 0  . Years of education: Not on file  . Highest education level: Not on file  Occupational History  . Occupation: Disabled (from arm fracture)  Social Needs  . Financial resource strain: Not on file  . Food insecurity:    Worry: Not on file    Inability: Not on file  . Transportation needs:    Medical: Not on file    Non-medical: Not on file  Tobacco Use  . Smoking status: Never Smoker  . Smokeless tobacco: Never Used  Substance and Sexual Activity  . Alcohol use: No    Alcohol/week: 0.0 oz  . Drug use: No  .  Sexual activity: Never    Birth control/protection: Surgical    Comment: Hyst--TAH--unsure if has ovaries  Lifestyle  . Physical activity:    Days per week: Not on file    Minutes per session: Not on file  . Stress: Not on file  Relationships  . Social connections:    Talks on phone: Not on file    Gets together: Not on file    Attends religious service: Not on file    Active member of club or organization: Not on file    Attends meetings of clubs or organizations: Not on file    Relationship status: Not on file  . Intimate partner violence:    Fear of current or ex partner: Not on file    Emotionally abused: Not on file    Physically abused: Not on file    Forced sexual activity: Not on file  Other  Topics Concern  . Not on file  Social History Narrative         Currently-disability for arm fracture      Mental Retardation, but is able to drive to places she is comfortable with and prepare her own meals and manage most of her own affairs.      [name] (neighbor) is POA/HCPOA      Reports she gets regular exercise and tries to eat healthy    Ms. Leys's family history includes Diabetes in her maternal aunt, maternal grandmother, and mother; Heart disease in her maternal aunt; Hypertension in her maternal grandmother and mother; Stroke in her father, maternal grandmother, and mother.      Objective:    Vitals:   05/29/17 1034  BP: 118/74  Pulse: 82    Physical Exam; well-developed older white female in no acute distress, pleasant blood pressure 118/74 pulse 82, height 5 foot 2, weight 202, BMI 36.9 patient ambulates with a walker.  HEENT; nontraumatic normocephalic EOMI PERRLA sclera anicteric, Neck supple, Cardiovascular ;regular rate and rhythm with S1-S2 no murmur rub or gallop, Pulmonary ;clear bilaterally, Abdomen; obese soft nontender nondistended bowel sounds are active there is no palpable mass or hepatosplenomegaly, Rectal ;exam not done, Extremities; no  clubbing cyanosis or edema skin warm and dry, Neuro psych ;alert and oriented, grossly nonfocal, patient repeats herself frequently.  Mood and affect appropriate       Assessment & Plan:   #75 62 year old white female with mild medication induced diarrhea setting of history of IBS. Patient's diarrhea had onset immediately after she increased dose of metformin and has been very mild.  This has responded to as needed Imodium and last episode was 5 days ago. I do not think she needs any further GI evaluation for diarrhea at this time  #2 history of multiple adenomatous colon polyps-will be due for follow-up colonoscopy March 2020 #3 chronic GERD stable  #4-history of multiple large gastric polyps, she has had several prior EGDs and had EMR with Dr. Arsenio Loader at [facility] October 2018 with removal of 3 large hyperplastic polyps from the stomach and one tubular adenoma from the duodenum.-She should have 1 year interval follow-up. #5 sarcoidosis #6.  Obesity #7.  Adult onset diabetes mellitus #8.  Moderate intellectual disability  Plan; continue omeprazole 20 mg p.o. every morning No further work-up of mild diarrhea which is secondary to increased dose of metformin Plan follow-up colonoscopy March 2020 Patient is advised to follow-up with Dr. Arsenio Loader in the fall 2019 for repeat EGD. She will follow-up with Dr. Havery Moros or myself in the interim on an as-needed basis.  Long discussion with the patient today, greater than 50% of the visit spent in counseling and coordination of care.  Amy S Esterwood PA-C 05/29/2017   Cc: [name], DO

## 2017-05-29 NOTE — Progress Notes (Signed)
Agree with assessment and plan as outlined.  

## 2017-05-29 NOTE — Patient Instructions (Signed)
Continue Prilosec 20 mg by mouth every morning. Use Loperamide ( Imodium) one tablet for diarrhea as needed. If diarrhea gets worse, see Dr. Maudie Mercury.   If you are age 63 or younger, your body mass index should be between 19-25. Your Body mass index is 36.97 kg/m. If this is out of the aformentioned range listed, please consider follow up with your Primary Care Provider.

## 2017-06-02 ENCOUNTER — Ambulatory Visit (INDEPENDENT_AMBULATORY_CARE_PROVIDER_SITE_OTHER): Payer: Medicare Other | Admitting: Family Medicine

## 2017-06-02 ENCOUNTER — Encounter: Payer: Self-pay | Admitting: Family Medicine

## 2017-06-02 ENCOUNTER — Ambulatory Visit: Payer: Medicare Other | Admitting: Physician Assistant

## 2017-06-02 VITALS — BP 122/82 | HR 99 | Temp 97.9°F | Ht 62.0 in | Wt 204.0 lb

## 2017-06-02 DIAGNOSIS — R197 Diarrhea, unspecified: Secondary | ICD-10-CM | POA: Diagnosis not present

## 2017-06-02 NOTE — Progress Notes (Signed)
HPI:  Using dictation device. Unfortunately this device frequently misinterprets words/phrases.  Acute visit for diarrhea: -Sounds like she had 2 episodes, one several weeks ago when she started her metformin, but she also ate differently, so she was not sure if it was related to what she ate or the metformin -She took some Imodium and symptoms resolved -She had a loose bowel last night -She is seeing GI about this -She is not having daily diarrhea, severe diarrhea, recurrent watery diarrhea or bleeding from her bowels -Otherwise is tolerating the metformin well She did take some Flagyl from her gynecologist about a month ago  ROS: See pertinent positives and negatives per HPI.  Past Medical History:  Diagnosis Date  . Allergic rhinitis   . Allergy   . Anemia   . Chronic low back pain   . DDD (degenerative disc disease), lumbar   . GERD (gastroesophageal reflux disease)    w/ hx diaphragmatic hernia  . Glaucoma   . GLAUCOMA NOS 03/11/2006   Qualifier: Diagnosis of  By: Diona Browner MD, Amy    . Heart murmur   . Hx of fracture    R arm, foot  . Hyperlipemia   . Hypertension   . IBS (irritable bowel syndrome)   . Low back pain 11/22/2012  . Meningioma 12/20/2001   Repeat MR from 2017 - radiology feels not a meningioma, but rather "Thickening of the anterior falx appears to be related to degenerative ossification and not meningioma." HCPOA opted not to do repeat imaging with CT for the possible hemangioma and declined further evaluation of this.  . Meningioma ([place])    ant falx, stable on prior imaging  . Moderate intellectual disabilities   . Murmur   . Obesity   . Osteoarthrosis, unspecified whether generalized or localized, ankle and foot   . Sarcoidosis    difuse bony lesions and LDA, dx by biopsy in 2016  . Transaminitis   . Type II or unspecified type diabetes mellitus without mention of complication, not stated as uncontrolled     Past Surgical History:  Procedure  Laterality Date  . CATARACT EXTRACTION Bilateral    Dr Katy Fitch  . COLONOSCOPY  04/15/2011  . ELBOW SURGERY     right elbow  . LIPOMA EXCISION Left [id number]   posterior left axilla  . TOTAL ABDOMINAL HYSTERECTOMY  03/05/1992   leiomyoma and cellular leiomyoma    Family History  Problem Relation Age of Onset  . Diabetes Mother   . Stroke Mother   . Hypertension Mother   . Stroke Father   . Diabetes Maternal Aunt   . Heart disease Maternal Aunt         (had pacemaker)  . Diabetes Maternal Grandmother   . Stroke Maternal Grandmother   . Hypertension Maternal Grandmother   . Colon cancer Neg Hx     SOCIAL HX: See HPI   Current Outpatient Medications:  .  Calcium Citrate-Vitamin D (CALCIUM + D PO), Take by mouth., Disp: , Rfl:  .  diltiazem (CARDIZEM CD) 240 MG 24 hr capsule, TAKE ONE CAPSULE BY MOUTH EVERY DAY, Disp: 90 capsule, Rfl: 2 .  ferrous sulfate 325 (65 FE) MG tablet, Take 1 tablet (325 mg total) daily with breakfast by mouth., Disp: 90 tablet, Rfl: 3 .  fluticasone (FLONASE) 50 MCG/ACT nasal spray, Place into both nostrils daily., Disp: , Rfl:  .  hydrochlorothiazide (MICROZIDE) 12.5 MG capsule, TAKE 1 CAPSULE BY MOUTH ONCE DAILY, Disp: 90 capsule, Rfl: 1 .  ipratropium (ATROVENT) 0.06 % nasal spray, PLACE 2 SPRAYS INTO BOTH NOSTRILS 4 (FOUR) TIMES DAILY., Disp: 45 mL, Rfl: 2 .  KLOR-CON M20 20 MEQ tablet, TAKE 1 TABLET (20 MEQ TOTAL) BY MOUTH DAILY., Disp: 90 tablet, Rfl: 1 .  latanoprost (XALATAN) 0.005 % ophthalmic solution, Place 1 drop into both eyes at bedtime. , Disp: , Rfl:  .  loratadine (CLARITIN) 10 MG tablet, Take 10 mg by mouth daily as needed for allergies., Disp: , Rfl:  .  meloxicam (MOBIC) 15 MG tablet, TAKE 1 TABLET BY MOUTH EVERY DAY, Disp: 90 tablet, Rfl: 0 .  metFORMIN (GLUCOPHAGE) 500 MG tablet, Take 2 tablets by mouth every morning and 1 every evening, Disp: 90 tablet, Rfl: 5 .  metroNIDAZOLE (METROGEL) 0.75 % vaginal gel, Insert one applicator full  in vagina in morning and at bedtime for 5 days, Disp: 70 g, Rfl: 0 .  omeprazole (PRILOSEC) 20 MG capsule, TAKE 1 CAPSULE (20 MG TOTAL) BY MOUTH 2 (TWO) TIMES DAILY. (Patient taking differently: TAKE 1 CAPSULE (20 MG TOTAL) BY MOUTH 2 ONE TIME DAILY.), Disp: 180 capsule, Rfl: 1 .  pravastatin (PRAVACHOL) 40 MG tablet, TAKE 1 TABLET BY MOUTH EVERY EVENING, Disp: 90 tablet, Rfl: 1 .  Propylene Glycol (SYSTANE BALANCE) 0.6 % SOLN, , Disp: , Rfl:  .  Vitamins-Lipotropics ([employer] INNER EAR PLUS PO), Take by mouth., Disp: , Rfl:   EXAM:  Vitals:   06/02/17 1618  BP: 122/82  Pulse: 99  Temp: 97.9 F (36.6 C)    Body mass index is 37.31 kg/m.  GENERAL: vitals reviewed and listed above, alert, oriented, appears well hydrated and in no acute distress  HEENT: atraumatic, conjunttiva clear, no obvious abnormalities on inspection of external nose and ears  NECK: no obvious masses on inspection  LUNGS: clear to auscultation bilaterally, no wheezes, rales or rhonchi, good air movement  CV: HRRR, no peripheral edema  MS: moves all extremities without noticeable abnormality Abdomen: Soft, nontender to palpation  PSYCH: pleasant and cooperative, no obvious depression or anxiety  ASSESSMENT AND PLAN:  Discussed the following assessment and plan:  Diarrhea, unspecified type  -It seems like she has had some very mild diarrhea that is occasional perhaps with the metformin versus dietary issues -She did see her gastroenterologist given her history -We discussed options and she decided to continue the metformin, keep a log of her symptoms and follow-up in a couple months -follow-up sooner if symptoms worsen or new concerns  Patient Instructions  Follow up as scheduled.  Use the imodium if needed for occasional diarrhea.  If severe diarrhea, daily diarrhea, multiple episodes of diarrhea daily or other concerns then follow up sooner.   [name], DO

## 2017-06-02 NOTE — Patient Instructions (Signed)
Follow up as scheduled.  Use the imodium if needed for occasional diarrhea.  If severe diarrhea, daily diarrhea, multiple episodes of diarrhea daily or other concerns then follow up sooner.

## 2017-06-03 DIAGNOSIS — E119 Type 2 diabetes mellitus without complications: Secondary | ICD-10-CM | POA: Diagnosis not present

## 2017-06-03 DIAGNOSIS — H401131 Primary open-angle glaucoma, bilateral, mild stage: Secondary | ICD-10-CM | POA: Diagnosis not present

## 2017-06-03 DIAGNOSIS — H04123 Dry eye syndrome of bilateral lacrimal glands: Secondary | ICD-10-CM | POA: Diagnosis not present

## 2017-06-03 DIAGNOSIS — Z961 Presence of intraocular lens: Secondary | ICD-10-CM | POA: Diagnosis not present

## 2017-06-03 LAB — HM DIABETES EYE EXAM

## 2017-06-04 ENCOUNTER — Ambulatory Visit: Payer: Medicare Other | Admitting: Physician Assistant

## 2017-06-04 ENCOUNTER — Telehealth: Payer: Self-pay | Admitting: Certified Nurse Midwife

## 2017-06-04 NOTE — Telephone Encounter (Signed)
Spoke with patient. Patient states that she is having intermittent vaginal itching every few days. Denies any constant itching, irritation, swelling or discharge. Is using coconut oil daily. Advised it is okay to have itching on occasion and that this is normal. Advised if itching becomes constant or develops new symptoms will need to be seen. Okay to continue coconut oil usage. Needs to keep the area clean and dry. Patient verbalizes understanding.  Routing to provider for final review. Patient agreeable to disposition. Will close encounter.

## 2017-06-04 NOTE — Telephone Encounter (Signed)
Patient called requesting to speak with our nurse Verline Lema about some vaginal irritation she said she is experiencing.

## 2017-06-05 ENCOUNTER — Telehealth: Payer: Self-pay | Admitting: Certified Nurse Midwife

## 2017-06-05 NOTE — Telephone Encounter (Signed)
Patient is asking to talk with [facility] again. Patient thinks her symptoms are worse from yesterday.

## 2017-06-05 NOTE — Telephone Encounter (Signed)
Spoke with patient. Patient reports intermittent vaginal itching and skin burns at urethra when urine touches skin .  Has been applying coconut oil externally prn, started bid 5/16.   Denies redness, constant itching, swelling, vag d/c or odor, frequency, lower back pain, fever/chills.  Instructed to keep area clean and dry. Can use water to pour on skin when urinating to stop burning if needed.  Advised ok to continue coconut oil usage. If symptoms do not improve or new symptoms develop, return call to office to schedule OV.   Patient read back instructions. Verbalizes understanding.   Routing to covering provider for final review. Patient is agreeable to disposition. Will close encounter.   Cc: Melvia Heaps, CNM

## 2017-06-08 ENCOUNTER — Telehealth: Payer: Self-pay | Admitting: Certified Nurse Midwife

## 2017-06-10 ENCOUNTER — Encounter

## 2017-06-10 ENCOUNTER — Encounter: Payer: Self-pay | Admitting: Certified Nurse Midwife

## 2017-06-10 ENCOUNTER — Ambulatory Visit (INDEPENDENT_AMBULATORY_CARE_PROVIDER_SITE_OTHER): Payer: Medicare Other | Admitting: Certified Nurse Midwife

## 2017-06-10 VITALS — BP 124/78 | HR 70 | Resp 16 | Ht 62.0 in | Wt 204.0 lb

## 2017-06-10 DIAGNOSIS — N762 Acute vulvitis: Secondary | ICD-10-CM | POA: Diagnosis not present

## 2017-06-10 NOTE — Progress Notes (Signed)
62 y.o. Single Caucasian female [name] here with complaint of vaginal symptoms of itching on right side of vagina,. Denies vaginal discharge or odor or vaginal bleeding. Onset of symptoms off and on last few days. Using coconut oil which works usually.. Denies new personal products.   Urinary symptoms denies any frequency, urgency or pain or incontinence. Had visit to see Dr. Maudie Mercury PCP for glucose follow up , "sugar was up" so glucose medication increased. "Doing Ok with change". No other issues today. Secretary calls weekly to make sure she has needs met each week and took her for eye exam recently. She also has house cleaner twice weekly.   Review of Systems  Constitutional: Negative for chills and fever.  Gastrointestinal: Negative for abdominal pain.  Genitourinary: Negative.   Skin: Positive for itching.       Vaginal area again, seems more than usual  Psychiatric/Behavioral:       Mentally challenged at times    O:Healthy female WDWN, New clothing noted today and appropriate for weather Affect: normal, orientation x 3  Exam: Skin: over all good condition, warm and dry Abdomen: soft, non tender, no masses  Inguinal Lymph nodes: no enlargement or tenderness Pelvic exam: External genital: normal female, atrophic appearance, no redness or scaling or exudate. Vulva on right site of sarcoid, no change in size, non tender, inside right vulva small appearing scratch area healing, patient points to this as area of concern, non tender BUS: negative Vagina: scant discharge noted. Ph:4.0   ,Wet prep taken,  Cervix:absent  Uterus:absent  Adnexa: non tender, no masses or fullness noted   Wet Prep results: KOH, saline negative, for pathogens   A:Normal pelvic exam History of chronic yeast , vulva, vaginal area and groin area, all normal appearance today, uses cornstarch for moisture with good results Type 2 Diabetic with PCP management Mentally Challenged, with adequate care provision from  appearance now and conversation   P:Discussed findings of no yeast noted and no treatment indicated. Discussed continue coconut oil to area of scratch for comfort and healing. Continue her routine with bathing and cornstarch use, because working well for her. Continue to work on good diet to prevent other health issues. Questions addressed.  RV prn

## 2017-06-11 ENCOUNTER — Other Ambulatory Visit: Payer: Self-pay | Admitting: Family Medicine

## 2017-06-16 ENCOUNTER — Other Ambulatory Visit: Payer: Self-pay

## 2017-06-16 ENCOUNTER — Telehealth: Payer: Self-pay | Admitting: Certified Nurse Midwife

## 2017-06-16 ENCOUNTER — Encounter: Payer: Self-pay | Admitting: Certified Nurse Midwife

## 2017-06-16 ENCOUNTER — Ambulatory Visit (INDEPENDENT_AMBULATORY_CARE_PROVIDER_SITE_OTHER): Payer: Medicare Other | Admitting: Certified Nurse Midwife

## 2017-06-16 VITALS — BP 120/82 | HR 70 | Resp 16 | Ht 62.0 in | Wt 204.0 lb

## 2017-06-16 DIAGNOSIS — B372 Candidiasis of skin and nail: Secondary | ICD-10-CM

## 2017-06-16 MED ORDER — FLUCONAZOLE 150 MG PO TABS: ORAL_TABLET | ORAL | 0 refills | Status: DC

## 2017-06-16 NOTE — Progress Notes (Signed)
  Subjective:     Patient ID: Linda Thompson, female   DOB: 09-22-1955, 62 y.o.   MRN: [id number]  Here complaining of redness and itching in right groin area,? Under abdomen,but "can't see to tell". Noted one day ago after becoming hot and sweating. Running air condition and wearing loose house dress when at home. Showering and blow drying vaginal area and tried to do area under belly this am. Put cornstarch as instructed, but noticed some itching and called to come in. Trying to watch "sweets" in diet. No vaginal complaints. No urinary symptoms.    Review of Systems  Constitutional: Negative for activity change, appetite change and fatigue.  Gastrointestinal: Negative for abdominal pain.  Genitourinary: Negative for genital sores, pelvic pain, urgency, vaginal bleeding and vaginal discharge.  Skin:       Redness in right groin area only with slight itching       Objective:   Physical Exam  Constitutional: She is oriented to person, place, and time. She appears well-developed and well-nourished.  Abdominal: Soft.  Genitourinary:    Pelvic exam was performed with patient supine. There is no rash, tenderness or lesion on the right labia. There is no rash, tenderness or lesion on the left labia.  Lymphadenopathy: No inguinal adenopathy noted on the right or left side.       Right: No inguinal adenopathy present.       Left: No inguinal adenopathy present.  Neurological: She is alert and oriented to person, place, and time.  Skin: Skin is warm and dry.  Psychiatric: She has a normal mood and affect. Her behavior is normal. Judgment and thought content normal.  Mentally challenged    No internal pelvic exam performed due to presentation of external skin only     Assessment:     Yeast dermatitis, chronic history  Type 2 Diabetes on medication with PCP Obese Mentally challenged, but does very well in self care and directions No change in right vulva size site of sarcoid    Plan:       Discussed finding with patient of yeast again. Discussed making sure house is cool enough for her, may need to be set on 73. Wear loose clothing and clean area in shower, blow dry as always and apply cornstarch dusting lightly. Rx Diflucan take one in am and repeat second one on Sunday  See order Questions addressed. Call if not resolving.  Rv prn

## 2017-06-16 NOTE — Telephone Encounter (Signed)
Left message to call Marvene Strohm at [phone]. 

## 2017-06-16 NOTE — Patient Instructions (Signed)
Take one pink pill today and take another one on Sunday. Keep area in groin dry, use cornstarch after shower. Wear gown or house dress when in house.

## 2017-06-16 NOTE — Telephone Encounter (Signed)
Spoke with patient. Patient states that yesterday her skin under her stomach into her groin area on the right side became more red, irritated, and itchy. Patient has been using cornstarch as directed and keeping the area clean and dry. Feels it is worsening and would like to be seen. Appointment scheduled for today at 11 am with [name]   Routing to provider for final review. Patient agreeable to disposition. Will close encounter.

## 2017-06-16 NOTE — Telephone Encounter (Signed)
Patient says she has broke out using cornstarch.

## 2017-06-26 ENCOUNTER — Telehealth: Payer: Self-pay | Admitting: Certified Nurse Midwife

## 2017-06-26 NOTE — Telephone Encounter (Signed)
Patient wants to speak with [facility].

## 2017-06-26 NOTE — Telephone Encounter (Signed)
Spoke with patient. Patient states that she has intermittent rectal itching. Uses baby wipes with bowel movements. Keeping the area clean and dry. Using Balmex as needed with relief. Asking if this is okay. Advised okay to continue using Balmex as needed. Okay to has occasional itching. It she develops new or worsening symptoms needs to contact the office. Patient is agreeable.  Routing to provider for final review. Patient agreeable to disposition. Will close encounter.

## 2017-06-29 ENCOUNTER — Telehealth: Payer: Self-pay

## 2017-06-29 NOTE — Telephone Encounter (Signed)
Spoke with patient. Patient states that she has intermittent vaginal itching when she uses the restroom. Denies any pain, urinary symptoms, or vaginal discharge. Has been using coconut oil once daily with some relief. Patient is concerned if she does not have Linda Thompson CNM look at the area she will continue to worry. Appointment scheduled for 6/12 at 9:30 am with Linda Thompson CNM. Advised to continue using coconut oil. Advised may use twice daily and will need to continue keeping the area clean and dry. Patient verbalizes understanding.  Routing to provider for final review. Patient agreeable to disposition. Will close encounter.

## 2017-07-01 ENCOUNTER — Other Ambulatory Visit: Payer: Self-pay

## 2017-07-01 ENCOUNTER — Ambulatory Visit (INDEPENDENT_AMBULATORY_CARE_PROVIDER_SITE_OTHER): Payer: Medicare Other | Admitting: Certified Nurse Midwife

## 2017-07-01 ENCOUNTER — Encounter: Payer: Self-pay | Admitting: Certified Nurse Midwife

## 2017-07-01 VITALS — BP 120/76 | HR 70 | Resp 16 | Ht 62.0 in | Wt 203.0 lb

## 2017-07-01 DIAGNOSIS — N952 Postmenopausal atrophic vaginitis: Secondary | ICD-10-CM | POA: Diagnosis not present

## 2017-07-01 DIAGNOSIS — B373 Candidiasis of vulva and vagina: Secondary | ICD-10-CM

## 2017-07-01 DIAGNOSIS — B3731 Acute candidiasis of vulva and vagina: Secondary | ICD-10-CM

## 2017-07-01 NOTE — Progress Notes (Signed)
62 y.o. Single Caucasian female [name] here with complaint of itching every 2-3 days around area she urinates from. Also notes some burning when touches skin "once a while".  Has not changed any of her products, soap or detergents. Denies urinary frequency, urgency or pain with urination. No incontinence, but does not wear underwear inside house. Wears Depends if she  Goes out only. Continues with coconut oil for dryness. Watching diet with increase sugar due to yeast and diabetes.   Review of Systems  Constitutional: Negative for chills, fever and malaise/fatigue.  Genitourinary: Negative for dysuria, frequency, hematuria and urgency.  Skin: Positive for itching. Negative for rash.       "around where I urinate"  Psychiatric/Behavioral: The patient is not nervous/anxious.     O:Healthy female WDWN Affect: normal, orientation x 3  Exam:Skin: warm and dry  Abdomen: soft, non tender, under abdominal skin flap very slight redness, cornstarch residue noted, no broken skin or weeping  Inguinal Lymph nodes: no enlargement or tenderness Pelvic exam: External genital: normal female with atrophy and previous pigment change with Lichen sclerosis. No flare noted. No groin redness bilateral or weeping. Scaling and increase pink of skin noted bilateral. Exudate noted and wet prep taken, no lesions. Right vulva known enlargement with sarcoid site, no change in size. BUS: negative Vagina: scant discharge, no redness, atrophic appearance Cervix/Uterus absent: Adnexa:surgically absent, no masses or tenderness noted  Wet Prep results: KOH, Saline + yeast only   A:Normal pelvic exam Chronic history of yeast dermatitis and vulvitis Yeast vulvitis today History of sarcoid right vulva, no size change Mentally challenged appropriate today for problem   P:Discussed findings of yeast vulvitis again and etiology. Discussed bath for comfort and make sure she is showering daily. Avoid moist clothes or pads for  extended period of time.  Continue Coconut Oil use for skin protection prior    Written instructions to use Miconazole or Clotrimazole cream externally twice daily for 5 days. She keeps this OTC at home, if not will purchase. Questions addressed.  Rv prn

## 2017-07-01 NOTE — Patient Instructions (Signed)
Use Miconazole cream or clotrimazole cream this was from  [facility]. You have used this before for external itching. Apply  with fingers to lip area of vagina two times daily for 5 days,. Use hair dryer to dry good in this area. Bathe daily. Do not change soap or detergent.  Call if problems

## 2017-07-02 ENCOUNTER — Telehealth: Payer: Self-pay | Admitting: Certified Nurse Midwife

## 2017-07-02 NOTE — Telephone Encounter (Signed)
Patient is asking to talk with a nurse. No details given.

## 2017-07-02 NOTE — Telephone Encounter (Signed)
Spoke with patient, patient calling to review instruction provided at 07/01/17 OV. Reviewed as seen below per Melvia Heaps, CNM. Patient read back instructions, questions answered.    Use Miconazole cream or clotrimazole cream this was from  [facility]. You have used this before for external itching. Apply  with fingers to lip area of vagina two times daily for 5 days,. Use hair dryer to dry good in this area. Bathe daily. Do not change soap or detergent.  Call if problems   Routing to provider for final review. Patient is agreeable to disposition. Will close encounter.

## 2017-07-03 ENCOUNTER — Telehealth: Payer: Self-pay | Admitting: Certified Nurse Midwife

## 2017-07-03 NOTE — Telephone Encounter (Signed)
Spoke with patient. Patient states that she realized her Desitin cream had expired so she went to the pharmacy this morning to buy more. Purchased more and threw away the expired tube. Uses this as needed. Advised patient she did the right thing.  Routing to provider for final review. Patient agreeable to disposition. Will close encounter.

## 2017-07-03 NOTE — Telephone Encounter (Signed)
Patient is asking to talk with [facility]. Patient is calling regarding her desitin that has expired. Patient is asking if she should buy more destin or use the expired destin?

## 2017-07-06 ENCOUNTER — Telehealth: Payer: Self-pay | Admitting: Certified Nurse Midwife

## 2017-07-06 NOTE — Telephone Encounter (Signed)
Patient is asking to talk with [facility] . Patient said "I have broken out" and to please call ASAP.

## 2017-07-06 NOTE — Telephone Encounter (Signed)
Spoke with patient. Patient states that on her right side under her stomach and down into her groin she is "breaking out." Skin is red and irritated. "It is sweating and I feel like I have a yeast infection." Has been apply cornstarch to the area per [name] recommendations. Feels the area is not getting better. Aware [name] is on vacation this week. Requesting to see Dr.Miller. Appointment scheduled for 07/07/2017 at 10 am with Dr.Miller. Patient is agreeable to date and time.  Routing to provider for final review. Patient agreeable to disposition. Will close encounter.

## 2017-07-07 ENCOUNTER — Other Ambulatory Visit: Payer: Self-pay | Admitting: Family Medicine

## 2017-07-07 ENCOUNTER — Telehealth: Payer: Self-pay | Admitting: Obstetrics & Gynecology

## 2017-07-07 ENCOUNTER — Encounter: Payer: Self-pay | Admitting: Obstetrics & Gynecology

## 2017-07-07 ENCOUNTER — Ambulatory Visit (INDEPENDENT_AMBULATORY_CARE_PROVIDER_SITE_OTHER): Payer: Medicare Other | Admitting: Obstetrics & Gynecology

## 2017-07-07 VITALS — BP 138/96 | HR 92 | Resp 16 | Ht 62.0 in | Wt 204.0 lb

## 2017-07-07 DIAGNOSIS — B372 Candidiasis of skin and nail: Secondary | ICD-10-CM

## 2017-07-07 MED ORDER — FLUCONAZOLE 200 MG PO TABS: ORAL_TABLET | ORAL | 0 refills | Status: DC

## 2017-07-07 NOTE — Telephone Encounter (Signed)
Patient is asking if she is supposed to continue using the "corn starch"?

## 2017-07-07 NOTE — Telephone Encounter (Signed)
Reviewed with Dr. Sabra Heck, stop use of cornstarch.   Call returned to patient, advised to stop cornstarch. Patient took first diflucan, will take again on Fri, Mon and Thursday. Picked up lotrimin from pharmacy, will start today. Patient read back instructions. Will f/u with Melvia Heaps, CNM next wk.   Routing to provider for final review. Patient is agreeable to disposition. Will close encounter.

## 2017-07-07 NOTE — Patient Instructions (Addendum)
Take the little pink pill (fluconazole) 200mg  every three days.  You will take one today, Friday, Monday and Thursday.  You need to get some over the counter Lotrimin or Lamisil.  You should apply it over all the red skin AT LEAST twice daily.    I want to look at your skin again in a week.

## 2017-07-07 NOTE — Progress Notes (Signed)
GYNECOLOGY  VISIT  CC:   Skin irritation  HPI: 62 y.o. [name] Single Caucasian female here for skin itching and redness that has been an ongoing issue.  Pt feels this is worse and called to be seen.  Denies any bleeding or breakdown on skin.  This is not vaginal.  Has used diflucan (the little pink pill per her description) but not in the last few days. This does help when having skin issues.  GYNECOLOGIC HISTORY: Patient's last menstrual period was 01/21/1992 (approximate). Contraception: post menopausal  Menopausal hormone therapy: none   Patient Active Problem List   Diagnosis Date Noted  . Hypertension associated with diabetes ([place]) 04/20/2017  . Hyperlipidemia associated with type 2 diabetes mellitus ([place]) 04/20/2017  . Murmur   . Arthritis 12/03/2015  . Multiple gastric polyps 08/30/2015  . Sarcoidosis 10/27/2014  . Morbid obesity ([place]) 05/05/2011  . Irritable bowel syndrome 03/05/2010  . Diabetes ([place]) 08/13/2007  . Allergic rhinitis 09/23/2006  . Moderate intellectual disabilities 03/11/2006  . Unspecified glaucoma 03/11/2006  . Gastroesophageal reflux disease 03/11/2006    Past Medical History:  Diagnosis Date  . Allergic rhinitis   . Allergy   . Anemia   . Chronic low back pain   . DDD (degenerative disc disease), lumbar   . GERD (gastroesophageal reflux disease)    w/ hx diaphragmatic hernia  . Glaucoma   . GLAUCOMA NOS 03/11/2006   Qualifier: Diagnosis of  By: Diona Browner MD, Amy    . Heart murmur   . Hx of fracture    R arm, foot  . Hyperlipemia   . Hypertension   . IBS (irritable bowel syndrome)   . Low back pain 11/22/2012  . Meningioma 12/20/2001   Repeat MR from 2017 - radiology feels not a meningioma, but rather "Thickening of the anterior falx appears to be related to degenerative ossification and not meningioma." HCPOA opted not to do repeat imaging with CT for the possible hemangioma and declined further evaluation of this.  . Meningioma ([place])    ant  falx, stable on prior imaging  . Moderate intellectual disabilities   . Murmur   . Obesity   . Osteoarthrosis, unspecified whether generalized or localized, ankle and foot   . Sarcoidosis    difuse bony lesions and LDA, dx by biopsy in 2016  . Transaminitis   . Type II or unspecified type diabetes mellitus without mention of complication, not stated as uncontrolled     Past Surgical History:  Procedure Laterality Date  . CATARACT EXTRACTION Bilateral    Dr Katy Fitch  . COLONOSCOPY  04/15/2011  . ELBOW SURGERY     right elbow  . LIPOMA EXCISION Left [id number]   posterior left axilla  . TOTAL ABDOMINAL HYSTERECTOMY  03/05/1992   leiomyoma and cellular leiomyoma    MEDS:   Current Outpatient Medications on File Prior to Visit  Medication Sig Dispense Refill  . Calcium Citrate-Vitamin D (CALCIUM + D PO) Take by mouth.    . diltiazem (CARDIZEM CD) 240 MG 24 hr capsule TAKE ONE CAPSULE BY MOUTH EVERY DAY 90 capsule 2  . ferrous sulfate 325 (65 FE) MG tablet Take 1 tablet (325 mg total) daily with breakfast by mouth. 90 tablet 3  . fluticasone (FLONASE) 50 MCG/ACT nasal spray Place into both nostrils daily.    . hydrochlorothiazide (MICROZIDE) 12.5 MG capsule TAKE 1 CAPSULE BY MOUTH ONCE DAILY 90 capsule 1  . ipratropium (ATROVENT) 0.06 % nasal spray PLACE 2 SPRAYS INTO  BOTH NOSTRILS 4 (FOUR) TIMES DAILY. 45 mL 2  . ketoconazole (NIZORAL) 2 % shampoo APPLY TO AFFECTED AREA TWICE A WEEK AS DIRECTED 120 mL 5  . KLOR-CON M20 20 MEQ tablet TAKE 1 TABLET (20 MEQ TOTAL) BY MOUTH DAILY. 90 tablet 1  . latanoprost (XALATAN) 0.005 % ophthalmic solution Place 1 drop into both eyes at bedtime.     . Loperamide-Simethicone 2-125 MG TABS Take by mouth as needed.    . loratadine (CLARITIN) 10 MG tablet Take 10 mg by mouth daily as needed for allergies.    . meloxicam (MOBIC) 15 MG tablet TAKE 1 TABLET BY MOUTH EVERY DAY 90 tablet 0  . metFORMIN (GLUCOPHAGE) 500 MG tablet Take 2 tablets by mouth every  morning and 1 every evening 90 tablet 5  . omeprazole (PRILOSEC) 20 MG capsule TAKE 1 CAPSULE (20 MG TOTAL) BY MOUTH 2 (TWO) TIMES DAILY. (Patient taking differently: TAKE 1 CAPSULE (20 MG TOTAL) BY MOUTH 2 ONE TIME DAILY.) 180 capsule 1  . pravastatin (PRAVACHOL) 40 MG tablet TAKE 1 TABLET BY MOUTH EVERY EVENING 90 tablet 1  . Propylene Glycol (SYSTANE BALANCE) 0.6 % SOLN     . Vitamins-Lipotropics ([employer] INNER EAR PLUS PO) Take by mouth.    . [DISCONTINUED] Calcium Carbonate-Vitamin D (CALCIUM-VITAMIN D) 500-200 MG-UNIT per tablet Take 1 tablet by mouth 2 (two) times daily with a meal.      . [DISCONTINUED] diltiazem (TIAZAC) 240 MG 24 hr capsule Take 1 capsule (240 mg total) by mouth daily. 90 capsule 0   No current facility-administered medications on file prior to visit.     ALLERGIES: Ace inhibitors; Amoxicillin-pot clavulanate; Augmentin [amoxicillin-pot clavulanate]; Azithromycin; Ceftin [cefuroxime axetil]; Cefuroxime; Cyclobenzaprine hcl; Flexeril [cyclobenzaprine]; Flonase [fluticasone propionate]; Fluticasone; and Ibuprofen  Family History  Problem Relation Age of Onset  . Diabetes Mother   . Stroke Mother   . Hypertension Mother   . Stroke Father   . Diabetes Maternal Aunt   . Heart disease Maternal Aunt         (had pacemaker)  . Diabetes Maternal Grandmother   . Stroke Maternal Grandmother   . Hypertension Maternal Grandmother   . Colon cancer Neg Hx     SH:  Single, non smoker  Review of Systems  All other systems reviewed and are negative.   PHYSICAL EXAMINATION:    BP (!) 138/96 (BP Location: Right Arm, Patient Position: Sitting, Cuff Size: Large)   Pulse 92   Resp 16   Ht 5\' 2"  (1.575 m)   Wt 204 lb (92.5 kg)   LMP 01/21/1992 (Approximate)   BMI 37.31 kg/m     Physical Exam  Constitutional: She is oriented to person, place, and time. She appears well-developed and well-nourished.  Neurological: She is alert and oriented to person, place, and time.   Skin: Lesion and rash noted. No abrasion, no bruising, no ecchymosis and no laceration noted. Rash is macular. There is erythema.     Psychiatric: She has a normal mood and affect. Her behavior is normal.      Chaperone was present for exam.  Assessment: Skin candida  Plan: Diflucan 200mg  every 3 days for four doses.   Topical Lamisil or Lotrimin recommended at least two times daily. Needs to recheck in about 1 week.    ~15 minutes spent with patient >50% of time was in face to face discussion of above.  Pt required multiple reassurances that she came for a good reason and that  she "did the right thing" in calling.

## 2017-07-15 ENCOUNTER — Ambulatory Visit (INDEPENDENT_AMBULATORY_CARE_PROVIDER_SITE_OTHER): Payer: Medicare Other | Admitting: Podiatry

## 2017-07-15 ENCOUNTER — Encounter: Payer: Self-pay | Admitting: Podiatry

## 2017-07-15 DIAGNOSIS — B351 Tinea unguium: Secondary | ICD-10-CM | POA: Diagnosis not present

## 2017-07-15 DIAGNOSIS — E1140 Type 2 diabetes mellitus with diabetic neuropathy, unspecified: Secondary | ICD-10-CM

## 2017-07-15 DIAGNOSIS — M79676 Pain in unspecified toe(s): Secondary | ICD-10-CM

## 2017-07-15 NOTE — Progress Notes (Signed)
Patient ID: Linda Thompson, female   DOB: 04-06-1955, 62 y.o.   MRN: [id number] Complaint:  Visit Type: Patient returns to my office for continued preventative foot care services. Complaint: Patient states" my nails have grown long and thick and become painful to walk and wear shoes" Patient has been diagnosed with DM with no foot complications. The patient presents for preventative foot care services. No changes to ROS.  Patient has pain on the inside border right big toenail.   Podiatric Exam: Vascular: dorsalis pedis and posterior tibial pulses are palpable bilateral. Capillary return is immediate. Temperature gradient is WNL. Skin turgor WNL  Sensorium: Normal Semmes Weinstein monofilament test. Normal tactile sensation bilaterally. Nail Exam: Pt has thick disfigured discolored nails with subungual debris noted bilateral entire nail hallux through fifth toenails Ulcer Exam: There is no evidence of ulcer or pre-ulcerative changes or infection. Orthopedic Exam: Muscle tone and strength are WNL. No limitations in general ROM. No crepitus or effusions noted. Foot type and digits show no abnormalities. Bony prominences are unremarkable. No ankle pain noted. Skin: No Porokeratosis. No infection or ulcers  Diagnosis:  Onychomycosis, , Pain in right toe, pain in left toes  Treatment & Plan Procedures and Treatment: Consent by patient was obtained for treatment procedures. The patient understood the discussion of treatment and procedures well. All questions were answered thoroughly reviewed. Debridement of mycotic and hypertrophic toenails, 1 through 5 bilateral and clearing of subungual debris. No ulceration, no infection noted. . ABN signed for 2019. Return Visit-Office Procedure: Patient instructed to return to the office for a follow up visit 3 months for continued evaluation and treatment.   Gardiner Barefoot DPM

## 2017-07-16 ENCOUNTER — Encounter: Payer: Self-pay | Admitting: Certified Nurse Midwife

## 2017-07-16 ENCOUNTER — Ambulatory Visit (INDEPENDENT_AMBULATORY_CARE_PROVIDER_SITE_OTHER): Payer: Medicare Other | Admitting: Certified Nurse Midwife

## 2017-07-16 ENCOUNTER — Telehealth: Payer: Self-pay | Admitting: Certified Nurse Midwife

## 2017-07-16 ENCOUNTER — Other Ambulatory Visit: Payer: Self-pay

## 2017-07-16 VITALS — BP 124/76 | HR 70 | Resp 16 | Ht 62.0 in | Wt 203.0 lb

## 2017-07-16 DIAGNOSIS — B372 Candidiasis of skin and nail: Secondary | ICD-10-CM

## 2017-07-16 MED ORDER — FLUCONAZOLE 200 MG PO TABS: ORAL_TABLET | ORAL | 0 refills | Status: DC

## 2017-07-16 NOTE — Telephone Encounter (Signed)
Patient has questions for the nurse about her cream.

## 2017-07-16 NOTE — Progress Notes (Signed)
62 y.o. Single Caucasian female [name] here for follow up of skin candida treated with DIflucan 200 mg  and Lotrimin AF topically initiated on 07/07/17. She uses the cream twice daily to all areas. Patient had stopped cornstarch due to increase in itching. She has one dose of Diflucan left to take and will do it in 3 days. States bathing daily, but apartment is warm at times. She has been eating better and not eating sweets or pasta. Has appointment with Dr. Maudie Mercury next week. Housekeeper still washing her clothing. Denies vaginal or rectal symptoms.  Review of Systems  Constitutional: Negative for chills, fever and malaise/fatigue.  Gastrointestinal: Negative for abdominal pain and blood in stool.  Genitourinary: Negative for dysuria, frequency and urgency.  Musculoskeletal: Negative for falls.  Skin: Positive for itching.       Under bra on left  Neurological: Negative for dizziness and headaches.      O: Healthy WD,WN female Affect: normal answers questions and repeats Skin:warm and dry Skin under breast area on left slight redness from sweating only Abdomen:skin under abdomen, dry with slight cream exudate, redness fading, no scaling or broken skin Groin area : drying area from skin yeast, with decrease redness and no exudate or scaling or broken skin. Pelvic exam:EXTERNAL GENITALIA: normal appearing vulva with no masses, tenderness or lesions, no redness or broken skin or rash noted  A:Yeast dermatitis of skin as above responding to Diflucan and Lotrimin AF   P: Discussed findings of skin areas improving since treatment. Continue Diflucan as prescribed and will include 2 more doses for a total of 3. Rx Diflucan see order with instructions Continue Lotrimin AF cream bid to areas and under left breast. Continue to bathe daily and put on clean clothes and bra. Air dry or use blow dryer to dry well.  Keep appointment with PCP for glucose follow up and reminded to discuss with her diet.  Printed instructions given to patient.  RV 2 weeks for recheck

## 2017-07-16 NOTE — Telephone Encounter (Signed)
Spoke with patient. Patient has questions about her medications. Advised to take one pink pill on Sunday and then take next pill Friday(5 days) take next pill on Wednesday (5 days) Advised to use Lotrimin AF cream 2 times daily on skin and under left breast , Stop cream under breast once cleared. Avoid cornstarch until cleared. Patient verbalizes understanding.  Routing to provider for final review. Patient agreeable to disposition. Will close encounter.

## 2017-07-16 NOTE — Patient Instructions (Signed)
Take one pink pill on Sunday and then take next pill Friday(5 days) take next pill on Wednesday (5 days) Use cream 2 times daily on skin and under left breast , Stop cream under breast once cleared. Avoid cornstarch until cleared.

## 2017-07-18 NOTE — Progress Notes (Signed)
HPI:  Using dictation device. Unfortunately this device frequently misinterprets words/phrases.  Linda Thompson is a pleasant 62 y.o. here for follow up. Chronic medical problems summarized below were reviewed for changes and stability and were updated as needed below. These issues and their treatment remain stable for the most part.  Reports overall she has been doing well.  Reports she is tolerating the higher dose of metformin.  We will check her diabetes lab and kidney function today.  She has a new concern of a nonhealing lesion on her upper mid back.  She reports this is been here for some time, possibly over a month or so.  She also has had some mild cold symptoms for about 1 week nasal congestion and drainage. No fevers or shortness of breath.  She sees gynecology for recurrent issues with vaginitis. Denies CP, SOB, DOE, treatment intolerance or new symptoms. Due for eye exam per epic.  AWV 11/2016 Diabetes: -meds: metformin -allergy to acei  Obesity, hyperlipidemia, hypertension: -meds: diltiazem, hctz, pravastatin  Moderate intellectual disabilities/Anxiety: -seeing psychiatrist -high level of anxiety about health with frequent visits  -HCPOA: Linda Thompson  Allergic rhinitis: -uses flonase, atrovent and allegra as needed  GERD/irritable bowel syndrome/Gastric polyps: -anemia -seeing GI at [facility] and [place] -meds: low dose PPI, iron  Sarcoidosis: -sees pulmonology -Bx 10/03/14 L [place]  - ACE level reported to be 98 ? 01/2015  - cxr 04/24/2015 wnl  Osteoarthritis,multiple sites: -sees Dr. Lorin Mercy -uses tylenol prn  Meningioma: -Repeat MR from 2017 - radiology feels not a meningioma, but rather "Thickening of the anterior falx appears to be related to degenerative ossification and not meningioma." -HCPOA opted not to do repeat imaging with CT for the possible hemangioma and declined further evaluation of this.    ROS: See pertinent positives and negatives per  HPI.  Past Medical History:  Diagnosis Date  . Allergic rhinitis   . Allergy   . Anemia   . Chronic low back pain   . DDD (degenerative disc disease), lumbar   . GERD (gastroesophageal reflux disease)    w/ hx diaphragmatic hernia  . Glaucoma   . GLAUCOMA NOS 03/11/2006   Qualifier: Diagnosis of  By: Diona Browner MD, Amy    . Heart murmur   . Hx of fracture    R arm, foot  . Hyperlipemia   . Hypertension   . IBS (irritable bowel syndrome)   . Low back pain 11/22/2012  . Meningioma 12/20/2001   Repeat MR from 2017 - radiology feels not a meningioma, but rather "Thickening of the anterior falx appears to be related to degenerative ossification and not meningioma." HCPOA opted not to do repeat imaging with CT for the possible hemangioma and declined further evaluation of this.  . Meningioma ([place])    ant falx, stable on prior imaging  . Moderate intellectual disabilities   . Murmur   . Obesity   . Osteoarthrosis, unspecified whether generalized or localized, ankle and foot   . Sarcoidosis    difuse bony lesions and LDA, dx by biopsy in 2016  . Transaminitis   . Type II or unspecified type diabetes mellitus without mention of complication, not stated as uncontrolled     Past Surgical History:  Procedure Laterality Date  . CATARACT EXTRACTION Bilateral    Dr Katy Fitch  . COLONOSCOPY  04/15/2011  . ELBOW SURGERY     right elbow  . LIPOMA EXCISION Left [id number]   posterior left axilla  . TOTAL ABDOMINAL HYSTERECTOMY  03/05/1992   leiomyoma and cellular leiomyoma    Family History  Problem Relation Age of Onset  . Diabetes Mother   . Stroke Mother   . Hypertension Mother   . Stroke Father   . Diabetes Maternal Aunt   . Heart disease Maternal Aunt         (had pacemaker)  . Diabetes Maternal Grandmother   . Stroke Maternal Grandmother   . Hypertension Maternal Grandmother   . Colon cancer Neg Hx     SOCIAL HX: See HPI   Current Outpatient Medications:  .  acetaminophen  (TYLENOL) 500 MG tablet, Take 500 mg by mouth as needed., Disp: , Rfl:  .  Calcium Citrate-Vitamin D (CALCIUM + D PO), Take by mouth., Disp: , Rfl:  .  diltiazem (CARDIZEM CD) 240 MG 24 hr capsule, TAKE ONE CAPSULE BY MOUTH EVERY DAY, Disp: 90 capsule, Rfl: 2 .  ferrous sulfate 325 (65 FE) MG tablet, Take 1 tablet (325 mg total) daily with breakfast by mouth., Disp: 90 tablet, Rfl: 3 .  fluconazole (DIFLUCAN) 200 MG tablet, Take one tablet every 5 days, Disp: 2 tablet, Rfl: 0 .  fluticasone (FLONASE) 50 MCG/ACT nasal spray, Place into both nostrils daily., Disp: , Rfl:  .  hydrochlorothiazide (MICROZIDE) 12.5 MG capsule, TAKE 1 CAPSULE BY MOUTH ONCE DAILY, Disp: 90 capsule, Rfl: 1 .  ipratropium (ATROVENT) 0.06 % nasal spray, PLACE 2 SPRAYS INTO BOTH NOSTRILS 4 (FOUR) TIMES DAILY., Disp: 45 mL, Rfl: 2 .  ketoconazole (NIZORAL) 2 % shampoo, APPLY TO AFFECTED AREA TWICE A WEEK AS DIRECTED, Disp: 120 mL, Rfl: 5 .  KLOR-CON M20 20 MEQ tablet, TAKE 1 TABLET (20 MEQ TOTAL) BY MOUTH DAILY., Disp: 90 tablet, Rfl: 1 .  latanoprost (XALATAN) 0.005 % ophthalmic solution, Place 1 drop into both eyes at bedtime. , Disp: , Rfl:  .  Loperamide-Simethicone 2-125 MG TABS, Take by mouth as needed., Disp: , Rfl:  .  loratadine (CLARITIN) 10 MG tablet, Take 10 mg by mouth daily as needed for allergies., Disp: , Rfl:  .  meloxicam (MOBIC) 15 MG tablet, TAKE 1 TABLET BY MOUTH EVERY DAY, Disp: 90 tablet, Rfl: 0 .  metFORMIN (GLUCOPHAGE) 500 MG tablet, Take 2 tablets by mouth every morning and 1 every evening, Disp: 90 tablet, Rfl: 5 .  omeprazole (PRILOSEC) 20 MG capsule, TAKE 1 CAPSULE (20 MG TOTAL) BY MOUTH 2 (TWO) TIMES DAILY. (Patient taking differently: TAKE 1 CAPSULE (20 MG TOTAL) BY MOUTH 2 ONE TIME DAILY.), Disp: 180 capsule, Rfl: 1 .  pravastatin (PRAVACHOL) 40 MG tablet, TAKE 1 TABLET BY MOUTH EVERY EVENING, Disp: 90 tablet, Rfl: 1 .  Propylene Glycol (SYSTANE BALANCE) 0.6 % SOLN, , Disp: , Rfl:  .   Vitamins-Lipotropics ([employer] INNER EAR PLUS PO), Take by mouth., Disp: , Rfl:   EXAM:  Vitals:   07/20/17 0952  BP: 122/90  Pulse: 96  Temp: 98 F (36.7 C)  SpO2: 96%    Body mass index is 36.62 kg/m.  GENERAL: vitals reviewed and listed above, alert, oriented, appears well hydrated and in no acute distress  HEENT: atraumatic, conjunttiva clear, no obvious abnormalities on inspection of external nose and ears  NECK: no obvious masses on inspection  LUNGS: clear to auscultation bilaterally, no wheezes, rales or rhonchi, good air movement  CV: HRRR, no peripheral edema  MS: moves all extremities without noticeable abnormality  SKIN: Erythematous, scabbed, somewhat somewhat thickened, flat skin lesion on the upper mid back  that is approximately 1.4 cm in diameter  PSYCH: pleasant and cooperative, no obvious depression or anxiety  ASSESSMENT AND PLAN:  Discussed the following assessment and plan:  Diabetes mellitus without complication ([place]) - Plan: Hemoglobin A1c  Hypertension associated with diabetes ([place]) - Plan: Basic metabolic panel  Hyperlipidemia associated with type 2 diabetes mellitus ([place])  Morbid obesity ([place])  Skin lesion - Plan: Ambulatory referral to Dermatology  -Discussed potential etiologies of the skin lesion and placed urgent referral to dermatology for biopsy or removal, this could simply be from picking or could potentially be a skin cancer or other lesion -We will recheck her hemoglobin A1c today and also her BMP given the increased dose of metformin -Recommend a healthy low sugar diet -Follow-up and annual wellness visit in November December advised -Patient advised to return or notify a doctor immediately if symptoms worsen or persist or new concerns arise.  Patient Instructions  BEFORE YOU LEAVE: -lab -check on eye exam, obtain/abstract or refer if needed -follow up:  In November for AWV with Manuela Schwartz (when due) and follow up with Dr. Maudie Mercury  -We  placed a referral for you as discussed to the dermatologist. It usually takes about 1-2 weeks to process and schedule this referral. If you have not heard from [place] regarding this appointment in 2 weeks please contact our office.     [name], DO

## 2017-07-20 ENCOUNTER — Encounter: Payer: Self-pay | Admitting: Family Medicine

## 2017-07-20 ENCOUNTER — Ambulatory Visit (INDEPENDENT_AMBULATORY_CARE_PROVIDER_SITE_OTHER): Payer: Medicare Other | Admitting: Family Medicine

## 2017-07-20 VITALS — BP 122/90 | HR 96 | Temp 98.0°F | Ht 62.0 in | Wt 200.2 lb

## 2017-07-20 DIAGNOSIS — I152 Hypertension secondary to endocrine disorders: Secondary | ICD-10-CM

## 2017-07-20 DIAGNOSIS — E1169 Type 2 diabetes mellitus with other specified complication: Secondary | ICD-10-CM | POA: Diagnosis not present

## 2017-07-20 DIAGNOSIS — L989 Disorder of the skin and subcutaneous tissue, unspecified: Secondary | ICD-10-CM

## 2017-07-20 DIAGNOSIS — E1159 Type 2 diabetes mellitus with other circulatory complications: Secondary | ICD-10-CM

## 2017-07-20 DIAGNOSIS — E785 Hyperlipidemia, unspecified: Secondary | ICD-10-CM | POA: Diagnosis not present

## 2017-07-20 DIAGNOSIS — E119 Type 2 diabetes mellitus without complications: Secondary | ICD-10-CM | POA: Diagnosis not present

## 2017-07-20 DIAGNOSIS — I10 Essential (primary) hypertension: Secondary | ICD-10-CM

## 2017-07-20 LAB — BASIC METABOLIC PANEL
BUN: 14 mg/dL (ref 6–23)
CHLORIDE: 104 meq/L (ref 96–112)
CO2: 30 mEq/L (ref 19–32)
CREATININE: 0.6 mg/dL (ref 0.40–1.20)
Calcium: 9.6 mg/dL (ref 8.4–10.5)
GFR: 107.33 mL/min (ref >60.00)
Glucose, Bld: 114 mg/dL — ABNORMAL HIGH (ref 70–99)
POTASSIUM: 4.7 meq/L (ref 3.5–5.1)
SODIUM: 144 meq/L (ref 135–145)

## 2017-07-20 LAB — HEMOGLOBIN A1C: HEMOGLOBIN A1C: 7.3 % — AB (ref 4.6–6.5)

## 2017-07-20 NOTE — Patient Instructions (Addendum)
BEFORE YOU LEAVE: -lab -check on eye exam, obtain/abstract or refer if needed -follow up:  In November for AWV with Manuela Schwartz (when due) and follow up with Dr. Maudie Mercury  -We placed a referral for you as discussed to the dermatologist. It usually takes about 1-2 weeks to process and schedule this referral. If you have not heard from [place] regarding this appointment in 2 weeks please contact our office.

## 2017-07-21 ENCOUNTER — Other Ambulatory Visit: Payer: Self-pay | Admitting: Family Medicine

## 2017-07-22 ENCOUNTER — Other Ambulatory Visit: Payer: Self-pay | Admitting: *Deleted

## 2017-07-22 MED ORDER — METFORMIN HCL 1000 MG PO TABS: 1000.0000 mg | ORAL_TABLET | Freq: Two times a day (BID) | ORAL | 0 refills | Status: DC

## 2017-07-22 NOTE — Addendum Note (Signed)
Addended by: Agnes Lawrence on: 07/22/2017 03:47 PM   Modules accepted: Orders

## 2017-07-27 ENCOUNTER — Other Ambulatory Visit: Payer: Self-pay | Admitting: Family Medicine

## 2017-07-27 ENCOUNTER — Other Ambulatory Visit: Payer: Self-pay | Admitting: Podiatry

## 2017-07-28 ENCOUNTER — Encounter: Payer: Self-pay | Admitting: Family Medicine

## 2017-07-28 ENCOUNTER — Ambulatory Visit (INDEPENDENT_AMBULATORY_CARE_PROVIDER_SITE_OTHER): Payer: Medicare Other | Admitting: Family Medicine

## 2017-07-28 VITALS — BP 140/88 | HR 108 | Temp 98.0°F | Ht 62.0 in | Wt 202.2 lb

## 2017-07-28 DIAGNOSIS — K219 Gastro-esophageal reflux disease without esophagitis: Secondary | ICD-10-CM

## 2017-07-28 DIAGNOSIS — R112 Nausea with vomiting, unspecified: Secondary | ICD-10-CM

## 2017-07-28 NOTE — Progress Notes (Signed)
   Subjective:    Patient ID: Linda Thompson, female    DOB: September 24, 1955, 62 y.o.   MRN: [id number]  HPI Here because immediately after she took her mornings meds and ate breakfast today (her usual grits and eggs) she laid back down in bed. Shortly after that she felt stomach contents coming up into the back of her mouth and she had to vomit. After vomiting she she felt better and she has felt fine ever since. No stomach cramps or diarrhea or fever. She does take an Omeprazole every morning. An upper endoscopy last year revealed a hiatal hernia.    Review of Systems  Constitutional: Negative.   Respiratory: Negative.   Cardiovascular: Negative.   Gastrointestinal: Positive for nausea and vomiting. Negative for abdominal distention, abdominal pain, constipation and diarrhea.  Neurological: Negative.        Objective:   Physical Exam  Constitutional: She appears well-developed and well-nourished.  Cardiovascular: Normal rate, regular rhythm, normal heart sounds and intact distal pulses.  Pulmonary/Chest: Effort normal and breath sounds normal. No stridor. No respiratory distress. She has no wheezes. She has no rales. She exhibits no tenderness.  Abdominal: Soft. Bowel sounds are normal. She exhibits no distension and no mass. There is no tenderness. There is no rebound and no guarding. No hernia.          Assessment & Plan:  She had a single bout of reflux with vomiting this morning, and I think the mean reason was she laid down immediately after eating breakfast. I advised her to stay on her current meds, but to wait at least 2 hours after eating a meal before she lies down. Recheck prn.  Alysia Penna, MD

## 2017-07-30 ENCOUNTER — Encounter: Payer: Self-pay | Admitting: Certified Nurse Midwife

## 2017-07-30 ENCOUNTER — Other Ambulatory Visit: Payer: Self-pay

## 2017-07-30 ENCOUNTER — Ambulatory Visit (INDEPENDENT_AMBULATORY_CARE_PROVIDER_SITE_OTHER): Payer: Medicare Other | Admitting: Certified Nurse Midwife

## 2017-07-30 VITALS — BP 110/70 | HR 68 | Resp 16 | Ht 62.0 in | Wt 203.0 lb

## 2017-07-30 DIAGNOSIS — B372 Candidiasis of skin and nail: Secondary | ICD-10-CM

## 2017-07-30 NOTE — Progress Notes (Signed)
61 y.o. Single Caucasian female [name] here for follow up of Candida  dermatitis treated with Diflucan and Lotrimin AF initiated on 07/16/17. Completed all medication as directed.  Denies any symptoms of itching on right or left side of groin. Using blow dryer after shower to keep area dry. Using coconut oil vaginally for dryness and occasional itching, no issues. Denies any bleeding from skin in groin. Saw PCP for diabetes follow up and glucose elevated. Has increased her Metformin. Trying to eat better diet. No other health issues today.    O: Healthy WD,WN female Affect: normal, orientation x 3  Skin:warm and dry Skin under breast: dry, no redness or rash Abdomen:soft,non tender, no masses, no redness under abdominal flap Pelvic exam:Groin area on left totally cleared of redness or weeping or rash. Right groin area still slight redness, no weeping or rash. Vulva area: no redness or rash noted.  Marland KitchenA:Yeast dermatitis resolving with Diflucan use,last dose 24 hours ago and Lotrimin AF topical use Mentally challenged, but has support Diabetic with increase in glucose being managed by PCP  P: Discussed finding with patient and instructed to continue to use Lotrimin AF topically to right groin area twice daily for 3 more days. Patient repeated instructions back to Provider. Printed instructions given. Reminded to wear light clothing to help with "sweating". Follow up as needed if reoccurs.  Rv prn

## 2017-07-30 NOTE — Patient Instructions (Signed)
Continue to apply cream on right side twice daily for one week or itching . Should clear. Keep area dry and no powder use.

## 2017-08-04 ENCOUNTER — Telehealth: Payer: Self-pay | Admitting: Certified Nurse Midwife

## 2017-08-04 NOTE — Telephone Encounter (Signed)
Spoke with patient, advised as seen below per Melvia Heaps, CNM. Patient read back instructions.   Patient request to cancel 08/06/17 OV, will call for OV if symptoms do not improve and resolve.   Routing to provider for final review. Patient is agreeable to disposition. Will close encounter.

## 2017-08-04 NOTE — Telephone Encounter (Signed)
Spoke with patient. Reports increased redness and itching in right groin, left groin has cleared. Internal vaginal itching, has been applying coconut oil.  Stopped lotrimin cream 3 days after last OV as instructed. Restarted lotrimin this morning to right groin. Reports increased sweating and is concerned symptoms are returning, requesting OV. OV scheduled for 7/18 at 10am with Melvia Heaps, CNM. Declined earlier OV. Advised will review with Melvia Heaps, CNM and return call with recommendations. Patient agreeable.   Melvia Heaps, CNM -ok to continue to use Lotrimin on right groin until OV?

## 2017-08-04 NOTE — Telephone Encounter (Signed)
Left message to call Linda Thompson at [phone].  

## 2017-08-04 NOTE — Telephone Encounter (Signed)
Patient called requesting to speak with the nurse about some itching she said she is experiencing "down there."

## 2017-08-04 NOTE — Telephone Encounter (Signed)
Return call to Jill. °

## 2017-08-04 NOTE — Telephone Encounter (Signed)
Discussed she could continue with use if did not totally clear. She is concerned because they are going to pave her parking lot and she will not be able to get in and out. Have her to clean area twice daily with soap and dry well and then apply Lotrimin cream

## 2017-08-06 ENCOUNTER — Telehealth: Payer: Self-pay | Admitting: Certified Nurse Midwife

## 2017-08-06 ENCOUNTER — Telehealth: Payer: Self-pay | Admitting: *Deleted

## 2017-08-06 ENCOUNTER — Encounter: Payer: Self-pay | Admitting: Certified Nurse Midwife

## 2017-08-06 ENCOUNTER — Ambulatory Visit (INDEPENDENT_AMBULATORY_CARE_PROVIDER_SITE_OTHER): Payer: Medicare Other | Admitting: Certified Nurse Midwife

## 2017-08-06 ENCOUNTER — Other Ambulatory Visit: Payer: Self-pay

## 2017-08-06 ENCOUNTER — Ambulatory Visit: Payer: Self-pay | Admitting: Certified Nurse Midwife

## 2017-08-06 VITALS — BP 142/92 | HR 76 | Resp 16 | Ht 62.0 in | Wt 204.0 lb

## 2017-08-06 DIAGNOSIS — Z8639 Personal history of other endocrine, nutritional and metabolic disease: Secondary | ICD-10-CM

## 2017-08-06 DIAGNOSIS — B372 Candidiasis of skin and nail: Secondary | ICD-10-CM | POA: Diagnosis not present

## 2017-08-06 NOTE — Progress Notes (Signed)
Spoke with patient while in office. See telephone encounter dated 08/06/17 for f/u with dermatology. Patient aware will be called with appt details once scheduled.

## 2017-08-06 NOTE — Telephone Encounter (Signed)
Spoke with patient, advised of appointment details as seen below. Provided patient with contact info, patient wrote down info and read back.   Advised to keep area clean and dry, no additional creams per Melvia Heaps, CNM.   Routing to provider for final review. Patient is agreeable to disposition. Will close encounter.  Cc: Magdalene Patricia

## 2017-08-06 NOTE — Telephone Encounter (Signed)
Patient says she is breaking out under her stomach and want to come in for appointment this morning.

## 2017-08-06 NOTE — Progress Notes (Deleted)
62 y.o. Single {Race/ethnicity:17218} female [name] here with complaint of vaginal symptoms of itching, burning, and increase discharge. Describes discharge as ***. Onset of symptoms *** days ago. Denies new personal products or vaginal dryness. *** STD concerns. Urinary symptoms *** . Contraception is  ROS  O:Healthy female WDWN Affect: normal, orientation x 3  Exam: Abdomen: Lymph node: no enlargement or tenderness Pelvic exam: External genital: normal female BUS: negative Vagina: *** discharge noted. Ph:   ,Wet prep taken, Affirm taken Cervix: normal, non tender, no CMT Uterus: normal, non tender Adnexa:normal, non tender, no masses or fullness noted   Wet Prep results:   A:Normal pelvic exam   P:Discussed findings of *** and etiology. Discussed Aveeno or baking soda sitz bath for comfort. Avoid moist clothes or pads for extended period of time. If working out in gym clothes or swim suits for long periods of time change underwear or bottoms of swimsuit if possible. Coconut Oil use for skin protection prior to activity can be used to external skin for protection or dryness. Rx: ***  Rv prn

## 2017-08-06 NOTE — Telephone Encounter (Signed)
Spoke with Linda Thompson at [facility], scheduled with Dr. Delman Cheadle on 08/12/17 at 10:20am. Instructed to fax referral to [phone].   Referral faxed as requested.

## 2017-08-06 NOTE — Telephone Encounter (Signed)
Left message to call Sharee Pimple, RN at [facility].   Seen in office today. Schedule OV for further evaluation of yeast dermatitis ASAP. Hx of DM2

## 2017-08-06 NOTE — Telephone Encounter (Signed)
Spoke with patient. Has concerns about skin irritation on abdomen. Using cream (unsure which) without improvement.  Has referral pending with derm from pcp for mole on back.   Patient scheduled for today at 11:00 with [name].  Encounter closed.

## 2017-08-06 NOTE — Progress Notes (Signed)
  Subjective:     Patient ID: Linda Thompson, female   DOB: January 30, 1955, 62 y.o.   MRN: [id number]  62 yo gopo white female here with complaint of increase itching, redness and wetness under abdomen and in groin area bilateral and on upper thighs. Also has itching on skin under left breast. She also has increase in Metformin dosage due to increase blood sugar. Cleaning lady washing her clothes with ? Same detergent.  No other changes. Continues to use depends only when out, otherwise no underwear and loose clothing. "Feels sore".    Review of Systems  Constitutional: Negative for activity change, appetite change, fatigue and fever.  Genitourinary: Negative for pelvic pain, urgency and vaginal bleeding.  Skin: Negative for rash.       See HPI  Psychiatric/Behavioral: The patient is not nervous/anxious.        Mentally challenged, but drives herself to appointments       Objective:   Physical Exam  Constitutional: She is oriented to person, place, and time. She appears well-developed and well-nourished.  Abdominal: Soft. There is no tenderness.  Lymphadenopathy: No inguinal adenopathy noted on the right or left side.  Neurological: She is alert and oriented to person, place, and time.  Skin: Skin is warm and dry. Rash noted. Rash is urticarial.     Redness noted in groin area bilateral with triangular area of no redness in mons area. Weeping appearance with odor, red, macular rash, non tender to touch  Skin under left breast red with weeping and slight macular rash  Psychiatric: She has a normal mood and affect. Her behavior is normal.       Assessment:     Chronic candida of genital area, responds to Diflucan, with recent treatment all clear with 5 days later this occurrence skin change Diabetic with last glucose per patient elevated with increase in medication  Mentally challenged, but functions well with assistance    Plan:     Discussed finding with patient and need for  referral to dermatology. Patient agreeable. Will schedule prior to leaving today.  Patient to bring her medication with her. Printed instructions given.  Rv  prn

## 2017-08-07 ENCOUNTER — Telehealth: Payer: Self-pay | Admitting: Certified Nurse Midwife

## 2017-08-07 NOTE — Telephone Encounter (Signed)
Patient has questions for the nurse regarding medications and the doctor she was referred to for next week. Available any time before 10:30.

## 2017-08-07 NOTE — Telephone Encounter (Signed)
Spoke with patient. Calling to confirm instructions for skin care and dermatology appt. Reviewed instructions per telephone encounter dated 7/18. Patient read back instructions.   Routing to provider for final review. Patient is agreeable to disposition. Will close encounter.

## 2017-08-12 DIAGNOSIS — L304 Erythema intertrigo: Secondary | ICD-10-CM | POA: Diagnosis not present

## 2017-08-12 DIAGNOSIS — L906 Striae atrophicae: Secondary | ICD-10-CM | POA: Diagnosis not present

## 2017-08-12 DIAGNOSIS — L821 Other seborrheic keratosis: Secondary | ICD-10-CM | POA: Diagnosis not present

## 2017-08-12 DIAGNOSIS — L570 Actinic keratosis: Secondary | ICD-10-CM | POA: Diagnosis not present

## 2017-08-12 DIAGNOSIS — D485 Neoplasm of uncertain behavior of skin: Secondary | ICD-10-CM | POA: Diagnosis not present

## 2017-08-18 ENCOUNTER — Other Ambulatory Visit: Payer: Self-pay | Admitting: Family Medicine

## 2017-08-18 ENCOUNTER — Telehealth: Payer: Self-pay | Admitting: Family Medicine

## 2017-08-18 NOTE — Telephone Encounter (Signed)
Patient went to [facility]   I had the patient fill out a medical records request to fax over the results.

## 2017-08-18 NOTE — Telephone Encounter (Signed)
Noted  

## 2017-08-31 ENCOUNTER — Other Ambulatory Visit: Payer: Self-pay | Admitting: Podiatry

## 2017-09-01 ENCOUNTER — Ambulatory Visit (INDEPENDENT_AMBULATORY_CARE_PROVIDER_SITE_OTHER): Payer: Medicare Other | Admitting: Family Medicine

## 2017-09-01 ENCOUNTER — Encounter: Payer: Self-pay | Admitting: Family Medicine

## 2017-09-01 VITALS — BP 120/88 | HR 97 | Temp 98.0°F | Ht 62.0 in | Wt 201.7 lb

## 2017-09-01 DIAGNOSIS — R030 Elevated blood-pressure reading, without diagnosis of hypertension: Secondary | ICD-10-CM

## 2017-09-01 DIAGNOSIS — K589 Irritable bowel syndrome without diarrhea: Secondary | ICD-10-CM

## 2017-09-01 NOTE — Progress Notes (Addendum)
HPI:  Using dictation device. Unfortunately this device frequently misinterprets words/phrases.  Acute visit for loose bowels: -PMH of the same, IBS, sees GI -reports 2 episodes of diarrhea 4 days ago, took imodium and this resolved -no fevers, vomiting, melena, hematochezia, abd pain  ROS: See pertinent positives and negatives per HPI.  Past Medical History:  Diagnosis Date  . Allergic rhinitis   . Allergy   . Anemia   . Chronic low back pain   . DDD (degenerative disc disease), lumbar   . GERD (gastroesophageal reflux disease)    w/ hx diaphragmatic hernia  . Glaucoma   . GLAUCOMA NOS 03/11/2006   Qualifier: Diagnosis of  By: Diona Browner MD, Amy    . Heart murmur   . Hx of fracture    R arm, foot  . Hyperlipemia   . Hypertension   . IBS (irritable bowel syndrome)   . Low back pain 11/22/2012  . Meningioma 12/20/2001   Repeat MR from 2017 - radiology feels not a meningioma, but rather "Thickening of the anterior falx appears to be related to degenerative ossification and not meningioma." HCPOA opted not to do repeat imaging with CT for the possible hemangioma and declined further evaluation of this.  . Meningioma ([place])    ant falx, stable on prior imaging  . Moderate intellectual disabilities   . Murmur   . Obesity   . Osteoarthrosis, unspecified whether generalized or localized, ankle and foot   . Sarcoidosis    difuse bony lesions and LDA, dx by biopsy in 2016  . Transaminitis   . Type II or unspecified type diabetes mellitus without mention of complication, not stated as uncontrolled     Past Surgical History:  Procedure Laterality Date  . CATARACT EXTRACTION Bilateral    Dr Katy Fitch  . COLONOSCOPY  04/15/2011  . ELBOW SURGERY     right elbow  . LIPOMA EXCISION Left [id number]   posterior left axilla  . TOTAL ABDOMINAL HYSTERECTOMY  03/05/1992   leiomyoma and cellular leiomyoma    Family History  Problem Relation Age of Onset  . Diabetes Mother   . Stroke Mother    . Hypertension Mother   . Stroke Father   . Diabetes Maternal Aunt   . Heart disease Maternal Aunt         (had pacemaker)  . Diabetes Maternal Grandmother   . Stroke Maternal Grandmother   . Hypertension Maternal Grandmother   . Colon cancer Neg Hx     SOCIAL HX: see hpi   Current Outpatient Medications:  .  acetaminophen (TYLENOL) 500 MG tablet, Take 500 mg by mouth as needed., Disp: , Rfl:  .  BUTENAFINE HCL EX, Apply topically as needed., Disp: , Rfl:  .  Calcium Citrate-Vitamin D (CALCIUM + D PO), Take by mouth., Disp: , Rfl:  .  diltiazem (CARDIZEM CD) 240 MG 24 hr capsule, TAKE ONE CAPSULE BY MOUTH EVERY DAY, Disp: 90 capsule, Rfl: 2 .  ferrous sulfate 325 (65 FE) MG tablet, Take 1 tablet (325 mg total) daily with breakfast by mouth., Disp: 90 tablet, Rfl: 3 .  fluticasone (FLONASE) 50 MCG/ACT nasal spray, Place into both nostrils daily., Disp: , Rfl:  .  hydrochlorothiazide (MICROZIDE) 12.5 MG capsule, TAKE 1 CAPSULE BY MOUTH ONCE DAILY, Disp: 90 capsule, Rfl: 2 .  ipratropium (ATROVENT) 0.06 % nasal spray, PLACE 2 SPRAYS INTO BOTH NOSTRILS 4 (FOUR) TIMES DAILY., Disp: 45 mL, Rfl: 2 .  ketoconazole (NIZORAL) 2 % shampoo, APPLY  TO AFFECTED AREA TWICE A WEEK AS DIRECTED, Disp: 120 mL, Rfl: 5 .  KLOR-CON M20 20 MEQ tablet, TAKE 1 TABLET (20 MEQ TOTAL) BY MOUTH DAILY., Disp: 90 tablet, Rfl: 1 .  latanoprost (XALATAN) 0.005 % ophthalmic solution, Place 1 drop into both eyes at bedtime. , Disp: , Rfl:  .  Loperamide-Simethicone 2-125 MG TABS, Take by mouth as needed., Disp: , Rfl:  .  loratadine (CLARITIN) 10 MG tablet, Take 10 mg by mouth daily as needed for allergies., Disp: , Rfl:  .  meloxicam (MOBIC) 15 MG tablet, TAKE 1 TABLET BY MOUTH EVERY DAY, Disp: 90 tablet, Rfl: 0 .  metFORMIN (GLUCOPHAGE) 1000 MG tablet, Take 1 tablet (1,000 mg total) by mouth 2 (two) times daily with a meal. (Patient taking differently: Take 1,000 mg by mouth. Takes 2 tablets twice daily), Disp: 180  tablet, Rfl: 0 .  omeprazole (PRILOSEC) 20 MG capsule, TAKE 1 CAPSULE (20 MG TOTAL) BY MOUTH 2 (TWO) TIMES DAILY. (Patient taking differently: TAKE 1 CAPSULE (20 MG TOTAL) BY MOUTH 2 ONE TIME DAILY.), Disp: 180 capsule, Rfl: 1 .  pravastatin (PRAVACHOL) 40 MG tablet, TAKE 1 TABLET BY MOUTH EVERY EVENING, Disp: 90 tablet, Rfl: 1 .  Propylene Glycol (SYSTANE BALANCE) 0.6 % SOLN, , Disp: , Rfl:  .  Vitamins-Lipotropics ([employer] INNER EAR PLUS PO), Take by mouth. Takes 2 in the am & 2 in the pm, Disp: , Rfl:   EXAM:  Vitals:   09/01/17 1026 09/01/17 1105  BP: 122/90 120/88  Pulse: 97   Temp: 98 F (36.7 C)     Body mass index is 36.89 kg/m.  GENERAL: vitals reviewed and listed above, alert, oriented, appears well hydrated and in no acute distress  HEENT: atraumatic, conjunttiva clear, no obvious abnormalities on inspection of external nose and ears  NECK: no obvious masses on inspection  LUNGS: clear to auscultation bilaterally, no wheezes, rales or rhonchi, good air movement  CV: HRRR, no peripheral edema  MS: moves all extremities without noticeable abnormality  ABD: BS+, soft, NTTP  PSYCH: pleasant and cooperative, no obvious depression or anxiety  ASSESSMENT AND PLAN:  Discussed the following assessment and plan:  Irritable bowel syndrome, unspecified type  Elevated blood pressure reading  -benign exam -glad symptoms have resolved, expect is from something she ate or IBS -ok to use imodium prn, recheck BP and follow up as scheduled -Patient advised to return or notify a doctor immediately if symptoms worsen or persist or new concerns arise.  Patient Instructions  Keep appointment as scheduled.  Use the imodium as needed for diarrhea.  Follow up with your gastroenterologist if any bleeding in your bowels, bad watery diarrhea more then 3 times daily for more then 3 days, vomiting, abdominal pain or you have further concerns with your bowels.  I am glad you are feeling  better.     [name], DO

## 2017-09-01 NOTE — Patient Instructions (Signed)
Keep appointment as scheduled.  Use the imodium as needed for diarrhea.  Follow up with your gastroenterologist if any bleeding in your bowels, bad watery diarrhea more then 3 times daily for more then 3 days, vomiting, abdominal pain or you have further concerns with your bowels.  I am glad you are feeling better.

## 2017-09-02 ENCOUNTER — Telehealth: Payer: Self-pay | Admitting: Podiatry

## 2017-09-02 ENCOUNTER — Other Ambulatory Visit: Payer: Self-pay

## 2017-09-02 NOTE — Telephone Encounter (Signed)
Pt needs refill on medication that Dr. March Rummage prescribed for her.

## 2017-09-02 NOTE — Telephone Encounter (Signed)
Pt is requesting refill medication on Meloxicam. This is her second time calling. Pharmacy sent over refill request Monday.

## 2017-09-03 ENCOUNTER — Ambulatory Visit: Payer: Medicare Other | Admitting: Family Medicine

## 2017-09-03 ENCOUNTER — Telehealth: Payer: Self-pay | Admitting: Physician Assistant

## 2017-09-03 NOTE — Telephone Encounter (Signed)
Spoke at length with the patient. She has had a recent increased in Metformin. Experienced diarrhea once on 08/28/17. She took OTC anti-diarrheal medication. She experienced it again on Monday 08/31/17. She took the anti-diarrheal medication again. She saw her PCP on 09/01/17. She has not had diarrhea since Monday 08/31/17. She states she will continue all her medications as she has been taking them. She will keep the anti-diarrheal medication on hand and only take it if she has diarrhea again. She will not take a laxative without checking with a doctor first. She does not have fever, chills, vomiting or abdominal pain. No blood with the bowel movements.

## 2017-09-03 NOTE — Telephone Encounter (Signed)
Pt has been having diarrhea, needs some advise on what to do. Ok to leave message in answering machine.

## 2017-09-14 ENCOUNTER — Telehealth: Payer: Self-pay | Admitting: Physician Assistant

## 2017-09-14 DIAGNOSIS — Z23 Encounter for immunization: Secondary | ICD-10-CM | POA: Diagnosis not present

## 2017-09-14 NOTE — Telephone Encounter (Signed)
Appointment 09/23/17.

## 2017-09-23 NOTE — Telephone Encounter (Signed)
Patient calls on 09/22/17 and cancels the appointment stating she feels better.

## 2017-09-24 ENCOUNTER — Ambulatory Visit: Payer: Medicare Other | Admitting: Physician Assistant

## 2017-09-25 ENCOUNTER — Encounter

## 2017-09-25 DIAGNOSIS — Z1231 Encounter for screening mammogram for malignant neoplasm of breast: Secondary | ICD-10-CM | POA: Diagnosis not present

## 2017-09-25 LAB — HM MAMMOGRAPHY

## 2017-09-28 ENCOUNTER — Encounter: Payer: Self-pay | Admitting: Family Medicine

## 2017-09-29 ENCOUNTER — Other Ambulatory Visit: Payer: Self-pay | Admitting: Family Medicine

## 2017-09-29 ENCOUNTER — Other Ambulatory Visit: Payer: Self-pay | Admitting: Podiatry

## 2017-10-01 DIAGNOSIS — L304 Erythema intertrigo: Secondary | ICD-10-CM | POA: Diagnosis not present

## 2017-10-11 DIAGNOSIS — R21 Rash and other nonspecific skin eruption: Secondary | ICD-10-CM | POA: Diagnosis not present

## 2017-10-12 ENCOUNTER — Telehealth: Payer: Self-pay | Admitting: Certified Nurse Midwife

## 2017-10-12 NOTE — Telephone Encounter (Signed)
VOID

## 2017-10-13 ENCOUNTER — Ambulatory Visit (INDEPENDENT_AMBULATORY_CARE_PROVIDER_SITE_OTHER): Payer: Medicare Other | Admitting: Certified Nurse Midwife

## 2017-10-13 ENCOUNTER — Other Ambulatory Visit: Payer: Self-pay

## 2017-10-13 ENCOUNTER — Encounter: Payer: Self-pay | Admitting: Certified Nurse Midwife

## 2017-10-13 ENCOUNTER — Telehealth: Payer: Self-pay | Admitting: Certified Nurse Midwife

## 2017-10-13 VITALS — BP 120/74 | HR 72 | Resp 18

## 2017-10-13 DIAGNOSIS — Z862 Personal history of diseases of the blood and blood-forming organs and certain disorders involving the immune mechanism: Secondary | ICD-10-CM

## 2017-10-13 DIAGNOSIS — B3731 Acute candidiasis of vulva and vagina: Secondary | ICD-10-CM

## 2017-10-13 DIAGNOSIS — F79 Unspecified intellectual disabilities: Secondary | ICD-10-CM

## 2017-10-13 DIAGNOSIS — B373 Candidiasis of vulva and vagina: Secondary | ICD-10-CM

## 2017-10-13 NOTE — Telephone Encounter (Signed)
Patient has some questions about how to apply the medication Melvia Heaps, CNM told her to use.?

## 2017-10-13 NOTE — Patient Instructions (Signed)
Written instructions given to patient.

## 2017-10-13 NOTE — Progress Notes (Signed)
  Subjective:     Patient ID: Linda Thompson, female   DOB: 29-May-1955, 62 y.o.   MRN: [id number]  62 yo white female here complaining of rectal rash that she noted last week. Was seen in Urgent care on  10/11/17 for rectal rash and was told to start using Desitin cream for rash. Denies any vaginal concerns. Was treated per Dr. Delman Cheadle several weeks ago for Yeast with medication. Using Zenzorb  Antifungal powder and cream as needed. Has had recheck appointment with all clearing. Plans to continue follow up if occurs again. Has colonoscopy scheduled due to polyps again. Having some vaginal scratch and some burning with coconut oil use. Feels better today. Some vaginal discharge,no odor or urinary leaking. No other health issues today.    Review of Systems  Constitutional: Negative.   HENT: Negative.   Eyes: Negative.   Respiratory: Negative.   Cardiovascular: Negative.   Gastrointestinal: Negative.   Endocrine: Negative for cold intolerance and heat intolerance.  Genitourinary: Negative for frequency, genital sores, pelvic pain, urgency, vaginal bleeding, vaginal discharge and vaginal pain.       Vaginal scratch with burning with coconut oil use. No vaginal odor or itching.  Allergic/Immunologic: Negative.   Neurological: Negative.   Psychiatric/Behavioral: Negative.        Objective:   Physical Exam  Constitutional: She is oriented to person, place, and time. She appears well-developed and well-nourished.  Abdominal: Soft. There is no tenderness.  Genitourinary: Rectum normal. Pelvic exam was performed with patient supine. There is no rash, tenderness or lesion on the right labia. There is no rash, tenderness or lesion on the left labia. Right adnexum displays no mass, no tenderness and no fullness. Left adnexum displays no mass, no tenderness and no fullness. There is erythema in the vagina. No tenderness or bleeding in the vagina. No foreign body in the vagina. Vaginal discharge found.   Genitourinary Comments: Had rash around rectum she wants to have checked Also some itching from vagina with discharge ? Color Right labia site of Sarcoidosis with mass approximately 5 cm, no change in appearance or size, slight scaling noted. Scant white discharge noted at introitus to vagina, wet prep taken. Slight increase pink of skin noted. Uterus, Cervix,Adnexa surgically absent. Anal area: no rash or redness noted  Lymphadenopathy: No inguinal adenopathy noted on the right or left side.       Right: No inguinal adenopathy present.       Left: No inguinal adenopathy present.  Neurological: She is alert and oriented to person, place, and time.  Mentally challenged  Skin: Skin is warm, dry and intact.  Psychiatric: She has a normal mood and affect. Her behavior is normal. Judgment and thought content normal.   Wet prep: Koh + yeast Saline + yeast    Assessment:     Normal pelvic exam History of right labial mass, site of Sarcoidosis, with follow up with PCP Yeast vaginitis Rectal rash resolved with Destin use     Plan:     Discussed finding with patient of normal pelvic exam, no change in right vulva size. Discussed yeast vaginitis and treatment with OTC Monistat one applicator every hs x 7 and apply some cream around entrance to vagina. Reassured rectal area normal appearance.  Questions addressed.  Rv prn

## 2017-10-13 NOTE — Telephone Encounter (Signed)
Reviewed with Melvia Heaps, CNM. Call returned to patient. Patient will place miconazole on finger and place vaginally bid x 5 days. Patient read back instructions, verbalizes understanding and is agreeable.    Routing to [employer], CNM.  Encounter closed.

## 2017-10-13 NOTE — Telephone Encounter (Addendum)
Spoke with patient. Patient has miconazole and applicators at home, expires 2021. Patient is unclear of instructions, use applicator and place vaginally or externally? Is aware to use bid x 5 days. Advised will review with Melvia Heaps, CNM and return call.   Routing to Melvia Heaps, CNM to advise.

## 2017-10-14 ENCOUNTER — Ambulatory Visit (INDEPENDENT_AMBULATORY_CARE_PROVIDER_SITE_OTHER): Payer: Medicare Other | Admitting: Podiatry

## 2017-10-14 ENCOUNTER — Encounter: Payer: Self-pay | Admitting: Podiatry

## 2017-10-14 DIAGNOSIS — M79676 Pain in unspecified toe(s): Secondary | ICD-10-CM

## 2017-10-14 DIAGNOSIS — B351 Tinea unguium: Secondary | ICD-10-CM

## 2017-10-14 DIAGNOSIS — E1140 Type 2 diabetes mellitus with diabetic neuropathy, unspecified: Secondary | ICD-10-CM

## 2017-10-14 NOTE — Progress Notes (Signed)
Patient ID: Linda Thompson, female   DOB: 1955-04-23, 62 y.o.   MRN: [id number] Complaint:  Visit Type: Patient returns to my office for continued preventative foot care services. Complaint: Patient states" my nails have grown long and thick and become painful to walk and wear shoes" Patient has been diagnosed with DM with no foot complications. The patient presents for preventative foot care services. No changes to ROS.   Podiatric Exam: Vascular: dorsalis pedis and posterior tibial pulses are palpable bilateral. Capillary return is immediate. Temperature gradient is WNL. Skin turgor WNL  Sensorium: Normal Semmes Weinstein monofilament test. Normal tactile sensation bilaterally. Nail Exam: Pt has thick disfigured discolored nails with subungual debris noted bilateral entire nail hallux through fifth toenails Ulcer Exam: There is no evidence of ulcer or pre-ulcerative changes or infection. Orthopedic Exam: Muscle tone and strength are WNL. No limitations in general ROM. No crepitus or effusions noted. Foot type and digits show no abnormalities. Bony prominences are unremarkable.  Skin: No Porokeratosis. No infection or ulcers  Diagnosis:  Onychomycosis, , Pain in right toe, pain in left toes  Treatment & Plan Procedures and Treatment: Consent by patient was obtained for treatment procedures. The patient understood the discussion of treatment and procedures well. All questions were answered thoroughly reviewed. Debridement of mycotic and hypertrophic toenails, 1 through 5 bilateral and clearing of subungual debris. No ulceration, no infection noted. . ABN signed for 2019. Patient wanted me to reorder  Mobic  # 90.  Told her that due to polyps and her GERD she should have her GI doctor reorder the Mobic.  Return Visit-Office Procedure: Patient instructed to return to the office for a follow up visit 3 months for continued evaluation and treatment.   Gardiner Barefoot DPM

## 2017-10-16 ENCOUNTER — Ambulatory Visit: Payer: Medicare Other | Admitting: Podiatry

## 2017-10-16 ENCOUNTER — Telehealth: Payer: Self-pay | Admitting: Physician Assistant

## 2017-10-16 NOTE — Telephone Encounter (Signed)
Patient is unsure who she is supposed to tell about a medication she takes for her arthritis of her feet. She is on Meloxicam. Her podiatrist told her to tell the GI the Meloxicam was not refilled. Patient is afraid to take Tylenol. She is instructed to share this information with Dr Arsenio Loader.

## 2017-10-20 ENCOUNTER — Telehealth: Payer: Self-pay | Admitting: Physician Assistant

## 2017-10-23 DIAGNOSIS — B351 Tinea unguium: Secondary | ICD-10-CM | POA: Diagnosis not present

## 2017-10-23 DIAGNOSIS — Z7984 Long term (current) use of oral hypoglycemic drugs: Secondary | ICD-10-CM | POA: Diagnosis not present

## 2017-10-23 DIAGNOSIS — K219 Gastro-esophageal reflux disease without esophagitis: Secondary | ICD-10-CM | POA: Diagnosis not present

## 2017-10-23 DIAGNOSIS — J309 Allergic rhinitis, unspecified: Secondary | ICD-10-CM | POA: Diagnosis not present

## 2017-10-23 DIAGNOSIS — K449 Diaphragmatic hernia without obstruction or gangrene: Secondary | ICD-10-CM | POA: Diagnosis not present

## 2017-10-23 DIAGNOSIS — Z79899 Other long term (current) drug therapy: Secondary | ICD-10-CM | POA: Diagnosis not present

## 2017-10-23 DIAGNOSIS — E785 Hyperlipidemia, unspecified: Secondary | ICD-10-CM | POA: Diagnosis not present

## 2017-10-23 DIAGNOSIS — K317 Polyp of stomach and duodenum: Secondary | ICD-10-CM | POA: Diagnosis not present

## 2017-10-23 DIAGNOSIS — Z9889 Other specified postprocedural states: Secondary | ICD-10-CM | POA: Diagnosis not present

## 2017-10-23 DIAGNOSIS — I10 Essential (primary) hypertension: Secondary | ICD-10-CM | POA: Diagnosis not present

## 2017-10-23 DIAGNOSIS — E119 Type 2 diabetes mellitus without complications: Secondary | ICD-10-CM | POA: Diagnosis not present

## 2017-10-24 ENCOUNTER — Other Ambulatory Visit: Payer: Self-pay | Admitting: Podiatry

## 2017-11-02 ENCOUNTER — Other Ambulatory Visit: Payer: Self-pay | Admitting: Family Medicine

## 2017-11-11 ENCOUNTER — Encounter: Payer: Self-pay | Admitting: Family Medicine

## 2017-11-11 ENCOUNTER — Ambulatory Visit (INDEPENDENT_AMBULATORY_CARE_PROVIDER_SITE_OTHER): Payer: Medicare Other | Admitting: Family Medicine

## 2017-11-11 ENCOUNTER — Ambulatory Visit: Payer: Self-pay | Admitting: *Deleted

## 2017-11-11 VITALS — BP 118/76 | HR 101 | Temp 97.8°F | Resp 16 | Ht 62.0 in | Wt 189.2 lb

## 2017-11-11 DIAGNOSIS — R112 Nausea with vomiting, unspecified: Secondary | ICD-10-CM

## 2017-11-11 DIAGNOSIS — E119 Type 2 diabetes mellitus without complications: Secondary | ICD-10-CM | POA: Diagnosis not present

## 2017-11-11 MED ORDER — ONDANSETRON HCL 4 MG PO TABS: 4.0000 mg | ORAL_TABLET | Freq: Two times a day (BID) | ORAL | 0 refills | Status: AC | PRN

## 2017-11-11 NOTE — Patient Instructions (Addendum)
  Linda Thompson I have seen you today for an acute visit.  A few things to remember from today's visit:   No diagnosis found.   If medications prescribed today, they will not be refill upon request, a follow up appointment with PCP will be necessary to discuss continuation of of treatment if appropriate.    Nausea and vomiting could be related to indigestion, GERD, or beginning of a viral gastroenteritis. Monitor for new symptoms.  Good hand hygiene is important, decrease risk of transmission. Small and frequent sips of clear fluids, Pedialyte or Gatorade age good options.  BRAT diet: bananas, rice, applesauce, and toast.  If persistent vomiting stop solids for 24 hours then resume liquid/soft diet and advance as tolerated.   Notified immediately or seek medical attention if you cannot keep fluids down, decreased urine output, severe abdominal pain, red bright blood per rectum, or changes in mental status.  Follow if not fully recovery in 2-3 weeks.  Monitor blood sugars at home.   In general please monitor for signs of worsening symptoms and seek immediate medical attention if any concerning.   I hope you get better soon!

## 2017-11-11 NOTE — Telephone Encounter (Signed)
Noted  

## 2017-11-11 NOTE — Telephone Encounter (Signed)
Pt vomited once this morning after breakfast and her morning medications. She is not nauseated, no fever, no diarrhea or abd pain. No blood in her vomit. She wants to see a provider to make sure she is not coming down with something. She has not taken anything for an upset stomach.  Advised to wait a couple of hours to see how she feels . She wants to see someone today.  Appointment scheduled per pt request.  Reason for Disposition . MILD or MODERATE vomiting (e.g., 1 - 5 times / day)  Answer Assessment - Initial Assessment Questions 1. VOMITING SEVERITY: "How many times have you vomited in the past 24 hours?"     - MILD:  1 - 2 times/day    - MODERATE: 3 - 5 times/day, decreased oral intake without significant weight loss or symptoms of dehydration    - SEVERE: 6 or more times/day, vomits everything or nearly everything, with significant weight loss, symptoms of dehydration      mild 2. ONSET: "When did the vomiting begin?"      Right after breakfast 3. FLUIDS: "What fluids or food have you vomited up today?" "Have you been able to keep any fluids down?"     Have not had anything to drink 4. ABDOMINAL PAIN: "Are your having any abdominal pain?" If yes : "How bad is it and what does it feel like?" (e.g., crampy, dull, intermittent, constant)      no 5. DIARRHEA: "Is there any diarrhea?" If so, ask: "How many times today?"      no 6. CONTACTS: "Is there anyone else in the family with the same symptoms?"      no 7. CAUSE: "What do you think is causing your vomiting?"     Not sure 8. HYDRATION STATUS: "Any signs of dehydration?" (e.g., dry mouth [not only dry lips], too weak to stand) "When did you last urinate?"     Urinated this morning 9. OTHER SYMPTOMS: "Do you have any other symptoms?" (e.g., fever, headache, vertigo, vomiting blood or coffee grounds, recent head injury)     no 10. PREGNANCY: "Is there any chance you are pregnant?" "When was your last menstrual period?"        n/a  Protocols used: [facility]

## 2017-11-11 NOTE — Progress Notes (Addendum)
ACUTE VISIT   HPI:  Chief Complaint  Patient presents with  . Emesis    this morning after eating breakfast and taking morning meds    Linda Thompson is a 62 y.o. female with history of IBS, GERD, hypertension, and DM type II who is here today complaining of 2 episodes of vomiting this morning after taking her medications. Vomiting food content and her medication, she denies hematemesis.  She has some nausea. It is exacerbated by food intake.  She feels better now.  She is afraid that she "may be getting something", so she wants to have something to take in case problem continues. She denies sick contact or recent travel. No new medications or recent antibiotic treatment.  No fever, chills, fatigue, change in appetite, abdominal pain, or urinary symptoms.  She has history of GERD and hiatal hernia but has not had heartburn.  Reporting EGD done 1 to 2 months ago, 4 polyps were removed.Currently she is on omeprazole 20 mg.  She has not eating any unusual food.   History of DM 2, currently she is on metformin. She does not check BS, states that she does not have a glucometer. Denies abdominal pain, nausea,vomiting, polydipsia,polyuria, or polyphagia.    Review of Systems  Constitutional: Positive for fatigue. Negative for appetite change, chills, fever and unexpected weight change.  HENT: Negative for congestion, mouth sores, sore throat and trouble swallowing.   Respiratory: Negative for cough, shortness of breath and wheezing.   Cardiovascular: Negative for chest pain and palpitations.  Gastrointestinal: Positive for nausea and vomiting. Negative for abdominal pain, blood in stool and diarrhea.  Genitourinary: Negative for decreased urine volume, dysuria and hematuria.  Musculoskeletal: Positive for gait problem. Negative for myalgias.  Skin: Negative for rash.  Neurological: Negative for weakness and headaches.  Hematological: Negative for adenopathy. Does  not bruise/bleed easily.      Current Outpatient Medications on File Prior to Visit  Medication Sig Dispense Refill  . acetaminophen (TYLENOL) 500 MG tablet Take 500 mg by mouth as needed.    . BUTENAFINE HCL EX Apply topically as needed.    . Calcium Citrate-Vitamin D (CALCIUM + D PO) Take by mouth.    . diltiazem (CARDIZEM CD) 240 MG 24 hr capsule TAKE ONE CAPSULE BY MOUTH EVERY DAY 90 capsule 2  . ferrous sulfate 325 (65 FE) MG tablet Take 1 tablet (325 mg total) daily with breakfast by mouth. 90 tablet 3  . fluticasone (FLONASE) 50 MCG/ACT nasal spray Place into both nostrils daily.    . hydrochlorothiazide (MICROZIDE) 12.5 MG capsule TAKE 1 CAPSULE BY MOUTH ONCE DAILY 90 capsule 2  . hydrocortisone 2.5 % ointment APPLY TO AFFECTED AREA TWICE A DAY  0  . ipratropium (ATROVENT) 0.06 % nasal spray PLACE 2 SPRAYS INTO BOTH NOSTRILS 4 (FOUR) TIMES DAILY. 45 mL 2  . ketoconazole (NIZORAL) 2 % shampoo APPLY TO AFFECTED AREA TWICE A WEEK AS DIRECTED 120 mL 5  . KLOR-CON M20 20 MEQ tablet TAKE 1 TABLET (20 MEQ TOTAL) BY MOUTH DAILY. 90 tablet 1  . latanoprost (XALATAN) 0.005 % ophthalmic solution Place 1 drop into both eyes at bedtime.     . Loperamide-Simethicone 2-125 MG TABS Take by mouth as needed.    . loratadine (CLARITIN) 10 MG tablet Take 10 mg by mouth daily as needed for allergies.    . meloxicam (MOBIC) 15 MG tablet TAKE 1 TABLET BY MOUTH EVERY DAY 90 tablet 0  .  meloxicam (MOBIC) 15 MG tablet TAKE 1 TABLET BY MOUTH EVERY DAY 30 tablet 0  . metFORMIN (GLUCOPHAGE) 1000 MG tablet Take 1 tablet (1,000 mg total) by mouth 2 (two) times daily with a meal. (Patient taking differently: Take 1,000 mg by mouth. Takes 2 tablets twice daily) 180 tablet 0  . omeprazole (PRILOSEC) 20 MG capsule TAKE 1 CAPSULE (20 MG TOTAL) BY MOUTH 2 (TWO) TIMES DAILY. 180 capsule 1  . pravastatin (PRAVACHOL) 40 MG tablet TAKE 1 TABLET BY MOUTH EVERY EVENING 90 tablet 1  . Propylene Glycol (SYSTANE BALANCE) 0.6 %  SOLN     . Vitamins-Lipotropics ([employer] INNER EAR PLUS PO) Take by mouth. Takes 2 in the am & 2 in the pm    . [DISCONTINUED] Calcium Carbonate-Vitamin D (CALCIUM-VITAMIN D) 500-200 MG-UNIT per tablet Take 1 tablet by mouth 2 (two) times daily with a meal.      . [DISCONTINUED] diltiazem (TIAZAC) 240 MG 24 hr capsule Take 1 capsule (240 mg total) by mouth daily. 90 capsule 0   No current facility-administered medications on file prior to visit.      Past Medical History:  Diagnosis Date  . Allergic rhinitis   . Allergy   . Anemia   . Chronic low back pain   . DDD (degenerative disc disease), lumbar   . GERD (gastroesophageal reflux disease)    w/ hx diaphragmatic hernia  . Glaucoma   . GLAUCOMA NOS 03/11/2006   Qualifier: Diagnosis of  By: Diona Browner MD, Amy    . Heart murmur   . Hx of fracture    R arm, foot  . Hyperlipemia   . Hypertension   . IBS (irritable bowel syndrome)   . Low back pain 11/22/2012  . Meningioma 12/20/2001   Repeat MR from 2017 - radiology feels not a meningioma, but rather "Thickening of the anterior falx appears to be related to degenerative ossification and not meningioma." HCPOA opted not to do repeat imaging with CT for the possible hemangioma and declined further evaluation of this.  . Meningioma ([place])    ant falx, stable on prior imaging  . Moderate intellectual disabilities   . Murmur   . Obesity   . Osteoarthrosis, unspecified whether generalized or localized, ankle and foot   . Sarcoidosis    difuse bony lesions and LDA, dx by biopsy in 2016  . Transaminitis   . Type II or unspecified type diabetes mellitus without mention of complication, not stated as uncontrolled    Allergies  Allergen Reactions  . Ace Inhibitors Cough  . Amoxicillin-Pot Clavulanate Diarrhea  . Augmentin [Amoxicillin-Pot Clavulanate] Other (See Comments)    diarrhea  . Azithromycin Diarrhea  . Ceftin [Cefuroxime Axetil]     diarrhea  . Cefuroxime Diarrhea and Other (See  Comments)    Generalized weakness  . Cyclobenzaprine Hcl Diarrhea  . Flexeril [Cyclobenzaprine] Diarrhea  . Flonase [Fluticasone Propionate] Other (See Comments)    Nose bleed  . Fluticasone Other (See Comments)    Nose bleed  . Ibuprofen Other (See Comments)    REACTION: vomiting    Social History   Socioeconomic History  . Marital status: Single    Spouse name: Not on file  . Number of children: 0  . Years of education: Not on file  . Highest education level: Not on file  Occupational History  . Occupation: Disabled (from arm fracture)  Social Needs  . Financial resource strain: Not on file  . Food insecurity:  Worry: Not on file    Inability: Not on file  . Transportation needs:    Medical: Not on file    Non-medical: Not on file  Tobacco Use  . Smoking status: Never Smoker  . Smokeless tobacco: Never Used  Substance and Sexual Activity  . Alcohol use: No    Alcohol/week: 0.0 standard drinks  . Drug use: No  . Sexual activity: Never    Birth control/protection: Surgical    Comment: Hyst--TAH--unsure if has ovaries  Lifestyle  . Physical activity:    Days per week: Not on file    Minutes per session: Not on file  . Stress: Not on file  Relationships  . Social connections:    Talks on phone: Not on file    Gets together: Not on file    Attends religious service: Not on file    Active member of club or organization: Not on file    Attends meetings of clubs or organizations: Not on file    Relationship status: Not on file  Other Topics Concern  . Not on file  Social History Narrative         Currently-disability for arm fracture      Mental Retardation, but is able to drive to places she is comfortable with and prepare her own meals and manage most of her own affairs.      [name] (neighbor) is POA/HCPOA      Reports she gets regular exercise and tries to eat healthy    Vitals:   11/11/17 0925  BP: 118/76  Pulse: (!) 101  Resp: 16  Temp:  97.8 F (36.6 C)  SpO2: 98%   Body mass index is 34.61 kg/m.   Physical Exam  Nursing note and vitals reviewed. Constitutional: She is oriented to person, place, and time. She appears well-developed. She does not appear ill. No distress.  HENT:  Head: Normocephalic and atraumatic.  Mouth/Throat: Oropharynx is clear and moist. Mucous membranes are dry.  Eyes: Conjunctivae are normal. No scleral icterus.  Cardiovascular: Regular rhythm. Tachycardia present.  No murmur heard. Respiratory: Effort normal and breath sounds normal. No respiratory distress.  GI: Soft. Bowel sounds are normal. She exhibits no distension and no mass. There is no tenderness.  Musculoskeletal: She exhibits no edema.  Lymphadenopathy:    She has no cervical adenopathy.  Neurological: She is alert and oriented to person, place, and time. She has normal strength.  Unstable gait assisted with a walker.  Skin: Skin is warm. No rash noted. No erythema.  Psychiatric: Her mood appears anxious.  Well groomed, good eye contact.      ASSESSMENT AND PLAN:   Ms. Rita was seen today for emesis.  Diagnoses and all orders for this visit:  Nausea and vomiting in adult  We discussed possible etiologies. Symptoms have improved. Recommend bland diet and a small sips of clear fluids for 24 hours. She will monitor for new associated symptoms like abdominal pain and/or diarrhea. Instructed about warning signs.  -     ondansetron (ZOFRAN) 4 MG tablet; Take 1 tablet (4 mg total) by mouth every 12 (twelve) hours as needed for up to 5 days for nausea or vomiting.  Type 2 diabetes mellitus without complication, without long-term current use of insulin (HCC)  Caution with hypoglycemic events. She does not have a glucometer and states that she will not know to use it.   Return if symptoms worsen or fail to improve.     -  Linda Thompson was advised to seek immediate medical attention if sudden worsening  symptoms.     Kanye Depree G. [place], MD  [facility]. [place] office.

## 2017-11-17 ENCOUNTER — Telehealth: Payer: Self-pay | Admitting: Podiatry

## 2017-11-17 ENCOUNTER — Other Ambulatory Visit: Payer: Self-pay | Admitting: Podiatry

## 2017-11-17 NOTE — Telephone Encounter (Signed)
Patient was calling for a refill on medication but Pharmacy told pt she would need to make appt. To see Doctor before refilling

## 2017-11-17 NOTE — Telephone Encounter (Signed)
I told pt that I had seen she had her toenails trimmed by Dr. Prudence Davidson, but if she was still having foot pain after a year she needed to be seen. Pt stated understanding. A. Horton called pt to schedule.

## 2017-11-23 ENCOUNTER — Other Ambulatory Visit: Payer: Self-pay | Admitting: Family Medicine

## 2017-11-26 ENCOUNTER — Ambulatory Visit (INDEPENDENT_AMBULATORY_CARE_PROVIDER_SITE_OTHER): Payer: Medicare Other | Admitting: Family Medicine

## 2017-11-26 ENCOUNTER — Encounter: Payer: Self-pay | Admitting: Family Medicine

## 2017-11-26 VITALS — BP 110/72 | HR 88 | Temp 97.8°F | Ht 62.0 in | Wt 186.3 lb

## 2017-11-26 DIAGNOSIS — E119 Type 2 diabetes mellitus without complications: Secondary | ICD-10-CM

## 2017-11-26 DIAGNOSIS — R630 Anorexia: Secondary | ICD-10-CM | POA: Diagnosis not present

## 2017-11-26 DIAGNOSIS — R634 Abnormal weight loss: Secondary | ICD-10-CM | POA: Diagnosis not present

## 2017-11-26 LAB — COMPREHENSIVE METABOLIC PANEL
ALK PHOS: 88 U/L (ref 39–117)
ALT: 73 U/L — ABNORMAL HIGH (ref 0–35)
AST: 49 U/L — ABNORMAL HIGH (ref 0–37)
Albumin: 4.4 g/dL (ref 3.5–5.2)
BUN: 16 mg/dL (ref 6–23)
CHLORIDE: 106 meq/L (ref 96–112)
CO2: 27 mEq/L (ref 19–32)
Calcium: 9.3 mg/dL (ref 8.4–10.5)
Creatinine, Ser: 0.65 mg/dL (ref 0.40–1.20)
GFR: 97.75 mL/min (ref >60.00)
GLUCOSE: 97 mg/dL (ref 70–99)
POTASSIUM: 4.4 meq/L (ref 3.5–5.1)
SODIUM: 143 meq/L (ref 135–145)
Total Bilirubin: 0.4 mg/dL (ref 0.2–1.2)
Total Protein: 6.8 g/dL (ref 6.0–8.3)

## 2017-11-26 LAB — CBC
HEMATOCRIT: 49 % — AB (ref 36.0–46.0)
HEMOGLOBIN: 16 g/dL — AB (ref 12.0–15.0)
MCHC: 32.6 g/dL (ref 30.0–36.0)
MCV: 90.6 fl (ref 78.0–100.0)
Platelets: 294 10*3/uL (ref 150.0–400.0)
RBC: 5.41 Mil/uL — ABNORMAL HIGH (ref 3.87–5.11)
RDW: 14.2 % (ref 11.5–15.5)
WBC: 8.7 10*3/uL (ref 4.0–10.5)

## 2017-11-26 LAB — MICROALBUMIN / CREATININE URINE RATIO
CREATININE, U: 179.6 mg/dL
MICROALB/CREAT RATIO: 2.7 mg/g (ref 0.0–30.0)
Microalb, Ur: 4.8 mg/dL — ABNORMAL HIGH (ref 0.0–1.9)

## 2017-11-26 LAB — TSH: TSH: 1.53 u[IU]/mL (ref 0.35–4.50)

## 2017-11-26 LAB — HEMOGLOBIN A1C: HEMOGLOBIN A1C: 6.4 % (ref 4.6–6.5)

## 2017-11-26 NOTE — Patient Instructions (Addendum)
Before leaving: Labs Microalb/cr urine for her diabetes - yearly check due Keep follow up as scheduled  Small healthy meals.  Avoid diary.  Schedule a follow up with GI for weight loss and anorexia

## 2017-11-26 NOTE — Progress Notes (Signed)
HPI:  Using dictation device. Unfortunately this device frequently misinterprets words/phrases.  Acute visit for loose stools: -intermittent loose stools chronically, thinks maybe a little more since procedure for polyp removal with her gastroenterologist in October -also has anorexia for 1.5 months -had a coughing spell once a few days ago - no dysphagia, and was not eating -denies: sob, cough now or persistent cough, fevers, malaise, diarrhea, vomiting, melena, hematochezia -on review of chart weight from 201 8/19 --> 186 today  ROS: See pertinent positives and negatives per HPI.  Past Medical History:  Diagnosis Date  . Allergic rhinitis   . Allergy   . Anemia   . Chronic low back pain   . DDD (degenerative disc disease), lumbar   . GERD (gastroesophageal reflux disease)    w/ hx diaphragmatic hernia  . Glaucoma   . GLAUCOMA NOS 03/11/2006   Qualifier: Diagnosis of  By: Diona Browner MD, Amy    . Heart murmur   . Hx of fracture    R arm, foot  . Hyperlipemia   . Hypertension   . IBS (irritable bowel syndrome)   . Low back pain 11/22/2012  . Meningioma 12/20/2001   Repeat MR from 2017 - radiology feels not a meningioma, but rather "Thickening of the anterior falx appears to be related to degenerative ossification and not meningioma." HCPOA opted not to do repeat imaging with CT for the possible hemangioma and declined further evaluation of this.  . Meningioma ([place])    ant falx, stable on prior imaging  . Moderate intellectual disabilities   . Murmur   . Obesity   . Osteoarthrosis, unspecified whether generalized or localized, ankle and foot   . Sarcoidosis    difuse bony lesions and LDA, dx by biopsy in 2016  . Transaminitis   . Type II or unspecified type diabetes mellitus without mention of complication, not stated as uncontrolled     Past Surgical History:  Procedure Laterality Date  . CATARACT EXTRACTION Bilateral    Dr Katy Fitch  . COLONOSCOPY  04/15/2011  . ELBOW  SURGERY     right elbow  . LIPOMA EXCISION Left [id number]   posterior left axilla  . TOTAL ABDOMINAL HYSTERECTOMY  03/05/1992   leiomyoma and cellular leiomyoma    Family History  Problem Relation Age of Onset  . Diabetes Mother   . Stroke Mother   . Hypertension Mother   . Stroke Father   . Diabetes Maternal Aunt   . Heart disease Maternal Aunt         (had pacemaker)  . Diabetes Maternal Grandmother   . Stroke Maternal Grandmother   . Hypertension Maternal Grandmother   . Colon cancer Neg Hx     SOCIAL HX: see hpi   Current Outpatient Medications:  .  acetaminophen (TYLENOL) 500 MG tablet, Take 500 mg by mouth as needed., Disp: , Rfl:  .  BUTENAFINE HCL EX, Apply topically as needed., Disp: , Rfl:  .  Calcium Citrate-Vitamin D (CALCIUM + D PO), Take by mouth., Disp: , Rfl:  .  diltiazem (CARDIZEM CD) 240 MG 24 hr capsule, TAKE ONE CAPSULE BY MOUTH EVERY DAY, Disp: 90 capsule, Rfl: 2 .  ferrous sulfate 325 (65 FE) MG tablet, Take 1 tablet (325 mg total) daily with breakfast by mouth., Disp: 90 tablet, Rfl: 3 .  fluticasone (FLONASE) 50 MCG/ACT nasal spray, Place into both nostrils daily., Disp: , Rfl:  .  hydrochlorothiazide (MICROZIDE) 12.5 MG capsule, TAKE 1 CAPSULE BY MOUTH  ONCE DAILY, Disp: 90 capsule, Rfl: 2 .  hydrocortisone 2.5 % ointment, APPLY TO AFFECTED AREA TWICE A DAY, Disp: , Rfl: 0 .  ipratropium (ATROVENT) 0.06 % nasal spray, PLACE 2 SPRAYS INTO BOTH NOSTRILS 4 (FOUR) TIMES DAILY., Disp: 45 mL, Rfl: 2 .  ketoconazole (NIZORAL) 2 % shampoo, APPLY TO AFFECTED AREA TWICE A WEEK AS DIRECTED, Disp: 120 mL, Rfl: 5 .  KLOR-CON M20 20 MEQ tablet, TAKE 1 TABLET (20 MEQ TOTAL) BY MOUTH DAILY., Disp: 90 tablet, Rfl: 1 .  latanoprost (XALATAN) 0.005 % ophthalmic solution, Place 1 drop into both eyes at bedtime. , Disp: , Rfl:  .  Loperamide-Simethicone 2-125 MG TABS, Take by mouth as needed., Disp: , Rfl:  .  loratadine (CLARITIN) 10 MG tablet, Take 10 mg by mouth daily as  needed for allergies., Disp: , Rfl:  .  meloxicam (MOBIC) 15 MG tablet, TAKE 1 TABLET BY MOUTH EVERY DAY, Disp: 90 tablet, Rfl: 0 .  meloxicam (MOBIC) 15 MG tablet, TAKE 1 TABLET BY MOUTH EVERY DAY, Disp: 30 tablet, Rfl: 0 .  metFORMIN (GLUCOPHAGE) 1000 MG tablet, TAKE 1 TABLET TWICE A DAY WITH A MEAL, Disp: 180 tablet, Rfl: 1 .  omeprazole (PRILOSEC) 20 MG capsule, TAKE 1 CAPSULE (20 MG TOTAL) BY MOUTH 2 (TWO) TIMES DAILY., Disp: 180 capsule, Rfl: 1 .  pravastatin (PRAVACHOL) 40 MG tablet, TAKE 1 TABLET BY MOUTH EVERY EVENING, Disp: 90 tablet, Rfl: 1 .  Propylene Glycol (SYSTANE BALANCE) 0.6 % SOLN, , Disp: , Rfl:  .  Vitamins-Lipotropics ([employer] INNER EAR PLUS PO), Take by mouth. Takes 2 in the am & 2 in the pm, Disp: , Rfl:   EXAM:  Vitals:   11/26/17 0939  BP: 110/72  Pulse: 88  Temp: 97.8 F (36.6 C)    Body mass index is 34.07 kg/m.  GENERAL: vitals reviewed and listed above, alert, oriented, appears well hydrated and in no acute distress  HEENT: atraumatic, conjunttiva clear, no obvious abnormalities on inspection of external nose and ears  NECK: no obvious masses on inspection  LUNGS: clear to auscultation bilaterally, no wheezes, rales or rhonchi, good air movement  CV: HRRR, no peripheral edema  ABD: Bowel sounds positive, soft, nontender to palpation  MS: moves all extremities without noticeable abnormality  PSYCH: pleasant and cooperative, no obvious depression or anxiety  ASSESSMENT AND PLAN:  Discussed the following assessment and plan:  Anorexia - Plan: CBC, Comprehensive metabolic panel, TSH  Weight loss  Type 2 diabetes mellitus without complication, without long-term current use of insulin (HCC) - Plan: Hemoglobin A1c, Microalbumin / creatinine urine ratio  -Labs per orders -Encouraged regular small meals -Advised reevaluation with GI for her reported change in bowels and anorexia with weight loss -she agrees to call to schedule -Up as scheduled in 1  month, sooner as needed -Patient advised to return or notify a doctor immediately if symptoms worsen or persist or new concerns arise.  Patient Instructions  Before leaving: Labs Microalb/cr urine for her diabetes - yearly check due Keep follow up as scheduled  Small healthy meals.  Avoid diary.  Schedule a follow up with GI for weight loss and anorexia      [name], DO

## 2017-11-27 ENCOUNTER — Telehealth: Payer: Self-pay | Admitting: Physician Assistant

## 2017-11-27 ENCOUNTER — Telehealth: Payer: Self-pay | Admitting: Family Medicine

## 2017-11-27 ENCOUNTER — Ambulatory Visit: Payer: Self-pay | Admitting: *Deleted

## 2017-11-27 NOTE — Telephone Encounter (Signed)
Patient brought her Metformin prescription bottle in.  The bottle has 2/2 wrote on it from the pharmacy, meaning 2nd bottle of two bottles.  Patient thought that meant 2 tablets of Metformin twice a day.  She thinks that meant that was what she was supposed to take.  She is wondering if that is why her stomach has been upset.  She is requesting a call to see if there is something she can take for her stomach upset.

## 2017-11-27 NOTE — Telephone Encounter (Signed)
I called the pt and informed her of the message below.  Appt scheduled for 11/21 and I advised she call back if she is not better on Monday.

## 2017-11-27 NOTE — Telephone Encounter (Signed)
Pt called with having c/o diarrhea, abd pain and nausea. She stated the she has been taking Metformin 4 a day instead of 2 a day for the last 2 to 3 weeks. It states on her bottle to take 1 tab twice a day with a meal.  She was seen in the office by Dr. Maudie Mercury yesterday for anorexia and loose stools. She had blood work done.  No mention in the notes of how the patient had been taking her metformin. Today she is requesting to see a provider to discuss her abd pain and nausea.  Notified flow at [facility] at [facility]. Will route this to the practice and request a call back. Pt states she is just trying to do the right thing.  Reinforced to patient to take her Metformin 1000 mg tab to 1 tab with a meal twice a day. Poison control notified and spoke with Kalman Shan, Therapist, sports. She recommends that the patient has a test for lactic acid. Reason for Disposition . [1] DOUBLE DOSE (an extra dose or lesser amount) of prescription drug AND [2] any symptoms (e.g., dizziness, nausea, pain, sleepiness)  Protocols used: MEDICATION QUESTION CALL-A-AH

## 2017-11-27 NOTE — Telephone Encounter (Signed)
Spoke with pt and reviewed recommendations in detail. Pt voiced understanding. Pt has already been scheduled for OV 12/10/17 with Kim. Pt will call next week if not improving.  Nothing further needed at this time.

## 2017-11-27 NOTE — Telephone Encounter (Signed)
Please call patient. She should be taking 1000mg  (1 tablet) of her metformin twice daily.  Also, she should schedule an appointment with her gastroenterologist if her symptoms do not resolve over the next 2 weeks with going to the normal dosing of the metformin. Yesterday she was not have any diarrhea or pain or vomiting. If any of these occur she needs to come in for recheck and check further labs.  I want her to follow up with me in 2 weeks otherwise and we will recheck labs, weight. She had very mildly elevated liver enzymes on her labs.

## 2017-11-27 NOTE — Telephone Encounter (Signed)
Left message for pt to call back.  Pt states she took to much metformin and she feels like she is going to vomit, she is also having diarrhea. Instructed her to call her PCP office. Pt states she went by there and her doctor is off today. Encouraged pt to call the office and ask for some help from the doctor that would be covering for her PCP. Pt will call that office back.

## 2017-11-27 NOTE — Telephone Encounter (Signed)
Spoke with Dr Regis Bill regarding patient and symptoms. Per Dr Regis Bill, labs from 11/26/17 are reassuring and not critical.  Kidney function is normal Not acidotic on chemistries Minor liver elevations; not alarming.   A1c 6.4, looks good  Patient can stop the Metformin completely over the weekend and schedule a visit with Dr Maudie Mercury first thing next week to further discuss medication and symptoms.   If patient is very concerned and/or having new symptoms in addition to what was discussed at [facility] with Dr Maudie Mercury 11/26/17 she can be worked in to see Dr Regis Bill this afternoon 3:30/3:45pm. Per Dr Regis Bill, Lactic Acid levels may not be needed since all other labs were normal/not critical.   Left detailed message for patient to return call.  Will hold for follow up and also forward to Dr Julianne Rice nurse as Juluis Rainier.

## 2017-11-29 ENCOUNTER — Emergency Department (HOSPITAL_COMMUNITY)
Admission: EM | Admit: 2017-11-29 | Discharge: 2017-11-29 | Disposition: A | Discharge status: Home / Self Care | Payer: Medicare Other | Attending: Emergency Medicine | Admitting: Emergency Medicine

## 2017-11-29 ENCOUNTER — Encounter (HOSPITAL_COMMUNITY): Payer: Self-pay | Admitting: Emergency Medicine

## 2017-11-29 DIAGNOSIS — D649 Anemia, unspecified: Secondary | ICD-10-CM | POA: Insufficient documentation

## 2017-11-29 DIAGNOSIS — I10 Essential (primary) hypertension: Secondary | ICD-10-CM | POA: Diagnosis not present

## 2017-11-29 DIAGNOSIS — E785 Hyperlipidemia, unspecified: Secondary | ICD-10-CM | POA: Diagnosis not present

## 2017-11-29 DIAGNOSIS — E119 Type 2 diabetes mellitus without complications: Secondary | ICD-10-CM | POA: Insufficient documentation

## 2017-11-29 DIAGNOSIS — R112 Nausea with vomiting, unspecified: Secondary | ICD-10-CM

## 2017-11-29 DIAGNOSIS — Z79899 Other long term (current) drug therapy: Secondary | ICD-10-CM | POA: Insufficient documentation

## 2017-11-29 LAB — CBG MONITORING, ED: Glucose-Capillary: 193 mg/dL — ABNORMAL HIGH (ref 70–99)

## 2017-11-29 LAB — COMPREHENSIVE METABOLIC PANEL
ALBUMIN: 4.1 g/dL (ref 3.5–5.0)
ALT: 74 U/L — ABNORMAL HIGH (ref 0–44)
ANION GAP: 15 (ref 5–15)
AST: 50 U/L — ABNORMAL HIGH (ref 15–41)
Alkaline Phosphatase: 79 U/L (ref 38–126)
BILIRUBIN TOTAL: 0.5 mg/dL (ref 0.3–1.2)
BUN: 9 mg/dL (ref 8–23)
CO2: 23 mmol/L (ref 22–32)
Calcium: 8.6 mg/dL — ABNORMAL LOW (ref 8.9–10.3)
Chloride: 106 mmol/L (ref 98–111)
Creatinine, Ser: 0.78 mg/dL (ref 0.44–1.00)
GFR calc Af Amer: >60 mL/min (ref >60)
Glucose, Bld: 167 mg/dL — ABNORMAL HIGH (ref 70–99)
Potassium: 3.3 mmol/L — ABNORMAL LOW (ref 3.5–5.1)
Sodium: 144 mmol/L (ref 135–145)
TOTAL PROTEIN: 7.2 g/dL (ref 6.5–8.1)

## 2017-11-29 LAB — URINALYSIS, ROUTINE W REFLEX MICROSCOPIC
BILIRUBIN URINE: NEGATIVE
GLUCOSE, UA: 150 mg/dL — AB
HGB URINE DIPSTICK: NEGATIVE
Ketones, ur: 5 mg/dL — AB
NITRITE: NEGATIVE
Protein, ur: NEGATIVE mg/dL
Specific Gravity, Urine: 1.015 (ref 1.005–1.030)
pH: 6 (ref 5.0–8.0)

## 2017-11-29 LAB — CBC WITH DIFFERENTIAL/PLATELET
Abs Immature Granulocytes: 0.01 10*3/uL (ref 0.00–0.07)
BASOS ABS: 0 10*3/uL (ref 0.0–0.1)
BASOS PCT: 0 %
EOS ABS: 0.1 10*3/uL (ref 0.0–0.5)
EOS PCT: 1 %
HEMATOCRIT: 50.7 % — AB (ref 36.0–46.0)
Hemoglobin: 16.1 g/dL — ABNORMAL HIGH (ref 12.0–15.0)
Immature Granulocytes: 0 %
LYMPHS ABS: 1 10*3/uL (ref 0.7–4.0)
Lymphocytes Relative: 13 %
MCH: 29 pg (ref 26.0–34.0)
MCHC: 31.8 g/dL (ref 30.0–36.0)
MCV: 91.2 fL (ref 80.0–100.0)
Monocytes Absolute: 0.6 10*3/uL (ref 0.1–1.0)
Monocytes Relative: 8 %
NRBC: 0 % (ref 0.0–0.2)
Neutro Abs: 6.3 10*3/uL (ref 1.7–7.7)
Neutrophils Relative %: 78 %
PLATELETS: 322 10*3/uL (ref 150–400)
RBC: 5.56 MIL/uL — ABNORMAL HIGH (ref 3.87–5.11)
RDW: 13.9 % (ref 11.5–15.5)
WBC: 8.1 10*3/uL (ref 4.0–10.5)

## 2017-11-29 LAB — LIPASE, BLOOD: LIPASE: 27 U/L (ref 11–51)

## 2017-11-29 MED ORDER — POTASSIUM CHLORIDE CRYS ER 20 MEQ PO TBCR: 40.0000 meq | EXTENDED_RELEASE_TABLET | Freq: Once | ORAL | Status: DC

## 2017-11-29 MED ORDER — ONDANSETRON 4 MG PO TBDP: 4.0000 mg | ORAL_TABLET | Freq: Three times a day (TID) | ORAL | 0 refills | Status: DC | PRN

## 2017-11-29 MED ORDER — SODIUM CHLORIDE 0.9 % IV BOLUS
500.0000 mL | Freq: Once | INTRAVENOUS | Status: AC
  Administered 2017-11-29: 500 mL via INTRAVENOUS

## 2017-11-29 MED ORDER — ONDANSETRON HCL 4 MG/2ML IJ SOLN
4.0000 mg | Freq: Once | INTRAMUSCULAR | Status: AC
  Administered 2017-11-29: 4 mg via INTRAVENOUS
  Filled 2017-11-29: qty 2

## 2017-11-29 NOTE — ED Notes (Signed)
Pt given diet ginger ale.

## 2017-11-29 NOTE — ED Provider Notes (Signed)
[facility] DEPT Provider Note   CSN: [id number] Arrival date & time: 11/29/17  0762     History   Chief Complaint Chief Complaint  Patient presents with  . Emesis    HPI Linda Thompson is a 62 y.o. female with a hx of anemia, HTN, hyperlipidemia, IBS, GERD, morbid obesity, sarcoidosis, and T2DM who presents to the ED for evaluation of nausea/vomiting today. Patient states she has had issues with nausea for about 1 week now and that she had 1 episode of emesis 3-4 days ago with some loose stools. She states that this prompted her to call her PCP and it was realized that she had been taking her Metformin improperly- 2000 mg BID as opposed to 1000 mg BID as it is prescribed. Her PCP office had spoken with poison control 11/8 who recommended a lactic acid level- PCP was consulted and reported patient's labs 11/7 were re-assuring and did not show signs of acidosis after have had the double doses of metformin therefore this was not felt to be acutely necessary, her LFTs were noted to be mildly elevated. She was instructed to DC her Metformin over the weekend and until her follow up appointment early next week with PCP. She has not been taking this medicine. She relays she felt well yesterday and was tolerating PO, but last night had another loose stool, felt nauseous this AM and had 1 episode of emesis. She remains feeling nauseous currently. No specific alleviating/aggravating factors. No meds PTA. Denies fever, chills, hematemesis, melena, hematochezia, abdominal pain, constipation, or dysuria.   HPI  Past Medical History:  Diagnosis Date  . Allergic rhinitis   . Allergy   . Anemia   . Chronic low back pain   . DDD (degenerative disc disease), lumbar   . GERD (gastroesophageal reflux disease)    w/ hx diaphragmatic hernia  . Glaucoma   . GLAUCOMA NOS 03/11/2006   Qualifier: Diagnosis of  By: Diona Browner MD, Amy    . Heart murmur   . Hx of fracture    R arm,  foot  . Hyperlipemia   . Hypertension   . IBS (irritable bowel syndrome)   . Low back pain 11/22/2012  . Meningioma 12/20/2001   Repeat MR from 2017 - radiology feels not a meningioma, but rather "Thickening of the anterior falx appears to be related to degenerative ossification and not meningioma." HCPOA opted not to do repeat imaging with CT for the possible hemangioma and declined further evaluation of this.  . Meningioma ([place])    ant falx, stable on prior imaging  . Moderate intellectual disabilities   . Murmur   . Obesity   . Osteoarthrosis, unspecified whether generalized or localized, ankle and foot   . Sarcoidosis    difuse bony lesions and LDA, dx by biopsy in 2016  . Transaminitis   . Type II or unspecified type diabetes mellitus without mention of complication, not stated as uncontrolled     Patient Active Problem List   Diagnosis Date Noted  . Hypertension associated with diabetes ([place]) 04/20/2017  . Hyperlipidemia associated with type 2 diabetes mellitus ([place]) 04/20/2017  . Murmur   . Arthritis 12/03/2015  . Multiple gastric polyps 08/30/2015  . Sarcoidosis 10/27/2014  . Morbid obesity ([place]) 05/05/2011  . Irritable bowel syndrome 03/05/2010  . Diabetes ([place]) 08/13/2007  . Allergic rhinitis 09/23/2006  . Moderate intellectual disabilities 03/11/2006  . Unspecified glaucoma 03/11/2006  . Gastroesophageal reflux disease 03/11/2006  Past Surgical History:  Procedure Laterality Date  . CATARACT EXTRACTION Bilateral    Dr Katy Fitch  . COLONOSCOPY  04/15/2011  . ELBOW SURGERY     right elbow  . LIPOMA EXCISION Left [id number]   posterior left axilla  . TOTAL ABDOMINAL HYSTERECTOMY  03/05/1992   leiomyoma and cellular leiomyoma     OB History    Gravida  0   Para  0   Term  0   Preterm  0   AB  0   Living  0     SAB  0   TAB  0   Ectopic  0   Multiple  0   Live Births               Home Medications    Prior to Admission medications     Medication Sig Start Date End Date Taking? Authorizing Provider  acetaminophen (TYLENOL) 500 MG tablet Take 500 mg by mouth as needed.    [provider]  BUTENAFINE HCL EX Apply topically as needed.    [provider]  Calcium Citrate-Vitamin D (CALCIUM + D PO) Take by mouth.    [provider]  diltiazem (CARDIZEM CD) 240 MG 24 hr capsule TAKE ONE CAPSULE BY MOUTH EVERY DAY 08/18/17   [name], DO  ferrous sulfate 325 (65 FE) MG tablet Take 1 tablet (325 mg total) daily with breakfast by mouth. 12/04/16   Armbruster, Carlota Raspberry, MD  fluticasone (FLONASE) 50 MCG/ACT nasal spray Place into both nostrils daily.    [provider]  hydrochlorothiazide (MICROZIDE) 12.5 MG capsule TAKE 1 CAPSULE BY MOUTH ONCE DAILY 07/27/17   [name] R, DO  hydrocortisone 2.5 % ointment APPLY TO AFFECTED AREA TWICE A DAY 10/27/17   [provider]  ipratropium (ATROVENT) 0.06 % nasal spray PLACE 2 SPRAYS INTO BOTH NOSTRILS 4 (FOUR) TIMES DAILY. 07/09/17   [name], DO  ketoconazole (NIZORAL) 2 % shampoo APPLY TO AFFECTED AREA TWICE A WEEK AS DIRECTED 06/11/17   [name] R, DO  KLOR-CON M20 20 MEQ tablet TAKE 1 TABLET (20 MEQ TOTAL) BY MOUTH DAILY. 09/29/17   [name], DO  latanoprost (XALATAN) 0.005 % ophthalmic solution Place 1 drop into both eyes at bedtime.  04/10/11   [provider]  Loperamide-Simethicone 2-125 MG TABS Take by mouth as needed.    [provider]  loratadine (CLARITIN) 10 MG tablet Take 10 mg by mouth daily as needed for allergies.    [provider]  meloxicam (MOBIC) 15 MG tablet TAKE 1 TABLET BY MOUTH EVERY DAY 07/27/17   Evelina Bucy, DPM  meloxicam (MOBIC) 15 MG tablet TAKE 1 TABLET BY MOUTH EVERY DAY 10/26/17   Evelina Bucy, DPM  metFORMIN (GLUCOPHAGE) 1000 MG tablet TAKE 1 TABLET TWICE A DAY WITH A MEAL 11/23/17   [name], DO  omeprazole (PRILOSEC) 20 MG capsule TAKE 1 CAPSULE (20 MG TOTAL) BY  MOUTH 2 (TWO) TIMES DAILY. 11/02/17   [name], DO  pravastatin (PRAVACHOL) 40 MG tablet TAKE 1 TABLET BY MOUTH EVERY EVENING 04/20/17   [name], DO  Propylene Glycol (SYSTANE BALANCE) 0.6 % SOLN     [provider]  Vitamins-Lipotropics ([employer] INNER EAR PLUS PO) Take by mouth. Takes 2 in the am & 2 in the pm    [provider]  Calcium Carbonate-Vitamin D (CALCIUM-VITAMIN D) 500-200 MG-UNIT per tablet Take 1 tablet  by mouth 2 (two) times daily with a meal.    03/26/11  [provider]  diltiazem (TIAZAC) 240 MG 24 hr capsule Take 1 capsule (240 mg total) by mouth daily. 07/20/13 10/01/13  Kennyth Arnold, FNP    Family History Family History  Problem Relation Age of Onset  . Diabetes Mother   . Stroke Mother   . Hypertension Mother   . Stroke Father   . Diabetes Maternal Aunt   . Heart disease Maternal Aunt         (had pacemaker)  . Diabetes Maternal Grandmother   . Stroke Maternal Grandmother   . Hypertension Maternal Grandmother   . Colon cancer Neg Hx     Social History Social History   Tobacco Use  . Smoking status: Never Smoker  . Smokeless tobacco: Never Used  Substance Use Topics  . Alcohol use: No    Alcohol/week: 0.0 standard drinks  . Drug use: No     Allergies   Ace inhibitors; Amoxicillin-pot clavulanate; Augmentin [amoxicillin-pot clavulanate]; Azithromycin; Ceftin [cefuroxime axetil]; Cefuroxime; Cyclobenzaprine hcl; Flexeril [cyclobenzaprine]; Flonase [fluticasone propionate]; Fluticasone; and Ibuprofen   Review of Systems Review of Systems  Constitutional: Negative for chills and fever.  Respiratory: Negative for shortness of breath.   Cardiovascular: Negative for chest pain.  Gastrointestinal: Positive for diarrhea, nausea and vomiting. Negative for abdominal pain, anal bleeding, blood in stool and constipation.  Genitourinary: Negative for dysuria.  All other systems reviewed and are negative.    Physical  Exam Updated Vital Signs BP (!) 149/97 (BP Location: Right Arm)   Pulse 70   Temp 97.7 F (36.5 C) (Oral)   Resp 18   Ht 5\' 2"  (1.575 m)   Wt 84.5 kg   LMP 01/21/1992 (Approximate)   SpO2 97%   BMI 34.07 kg/m   Physical Exam  Constitutional: She appears well-developed and well-nourished.  Non-toxic appearance. No distress.  HENT:  Head: Normocephalic and atraumatic.  Eyes: Conjunctivae are normal. Right eye exhibits no discharge. Left eye exhibits no discharge.  Neck: Neck supple.  Cardiovascular: Normal rate and regular rhythm.  Pulmonary/Chest: Effort normal and breath sounds normal. No respiratory distress. She has no wheezes. She has no rhonchi. She has no rales.  Respiration even and unlabored  Abdominal: Soft. Bowel sounds are normal. She exhibits no distension. There is no tenderness. There is no rigidity, no rebound, no guarding and no CVA tenderness.  Neurological: She is alert.  Clear speech.   Skin: Skin is warm and dry. No rash noted.  Psychiatric: She has a normal mood and affect. Her behavior is normal.  Nursing note and vitals reviewed.    ED Treatments / Results  Labs (all labs ordered are listed, but only abnormal results are displayed) Labs Reviewed  CBC WITH DIFFERENTIAL/PLATELET - Abnormal; Notable for the following components:      Result Value   RBC 5.56 (*)    Hemoglobin 16.1 (*)    HCT 50.7 (*)    All other components within normal limits  COMPREHENSIVE METABOLIC PANEL - Abnormal; Notable for the following components:   Potassium 3.3 (*)    Glucose, Bld 167 (*)    Calcium 8.6 (*)    AST 50 (*)    ALT 74 (*)    All other components within normal limits  URINALYSIS, ROUTINE W REFLEX MICROSCOPIC - Abnormal; Notable for the following components:   APPearance CLOUDY (*)    Glucose, UA 150 (*)    Ketones,  ur 5 (*)    Leukocytes, UA SMALL (*)    Bacteria, UA RARE (*)    All other components within normal limits  CBG MONITORING, ED - Abnormal;  Notable for the following components:   Glucose-Capillary 193 (*)    All other components within normal limits  LIPASE, BLOOD    EKG None  Radiology No results found.  Procedures Procedures (including critical care time)  Medications Ordered in ED Medications  potassium chloride SA (K-DUR,KLOR-CON) CR tablet 40 mEq (has no administration in time range)  sodium chloride 0.9 % bolus 500 mL (500 mLs Intravenous New Bag/Given 11/29/17 0901)  ondansetron (ZOFRAN) injection 4 mg (4 mg Intravenous Given 11/29/17 0902)     Initial Impression / Assessment and Plan / ED Course  I have reviewed the triage vital signs and the nursing notes.  Pertinent labs & imaging results that were available during my care of the patient were reviewed by me and considered in my medical decision making (see chart for details).   Patient presents to the ED for N/V/D. Patient nontoxic appearing, in no apparent distress, vitals WNL other than elevated BP, doubt HTN emergency. Exam benign. Will evaluate with labs and provide fluids and zofran.   Labs reviewed and consistent with prior. Mild hypokalemia given oral supplement in the ED and diet recommendations. Renal function preserved. Urinalysis without infection, mild amount of ketones (5), elevated blood glucose at 167, but not acidotic and no elevation in anion gap to lead to concern for DKA. Her CBC is fairly unremarkable. Her LFTs are somewhat elevated- consistent with check 11/7- likely due to excess metformin. She does not have findings of metabolic acidosis to raise concern for lactic acidosis, especially 48 hours since last ingestion. Her abdomen is nontender, no peritoneal signs- doubt obstruction/perforation, cholecystitis, pancreatitis, diverticulitis, appendicitis. She is feeling improved following fluids and zofran- tolerating PO. Feel she is safe for discharge with close PCP follow up regarding recheck of sxs and discussion of metformin management- will  continue to hold at this time per their recommendations on chart review. Will provide a few tablets of zofran as needed. I discussed results, treatment plan, need for follow-up, and return precautions with the patient. Provided opportunity for questions, patient confirmed understanding and is in agreement with plan.   Findings and plan of care discussed with supervising physician Dr. Tomi Bamberger who is in agreement.   Final Clinical Impressions(s) / ED Diagnoses   Final diagnoses:  Non-intractable vomiting with nausea, unspecified vomiting type    ED Discharge Orders         Ordered    ondansetron (ZOFRAN ODT) 4 MG disintegrating tablet  Every 8 hours PRN     11/29/17 1029           Tessla Spurling, [place] R, PA-C 11/29/17 1029    Dorie Rank, MD 11/29/17 1420

## 2017-11-29 NOTE — Discharge Instructions (Signed)
You are seen in the emergency department today for nausea and vomiting.  Your work-up in the emergency room reassuring.  Your labs appeared similar to prior labs you have had done.  Your potassium was a bit low therefore we gave you some potassium in the ER and have given you information regarding potassium rich foods to include in your diet.  Your liver function was a bit elevated this is likely due to your use of metformin recently, continue to hold metformin (do not take this medicine) until you have seen your primary care provider.  We are sending you with prescription for Zofran to help with nausea should return.  Please take this every 8 hours as needed for nausea and vomiting by placing a tablet under your tongue and allowing it to dissolve.  We have prescribed you new medication(s) today. Discuss the medications prescribed today with your pharmacist as they can have adverse effects and interactions with your other medicines including over the counter and prescribed medications. Seek medical evaluation if you start to experience new or abnormal symptoms after taking one of these medicines, seek care immediately if you start to experience difficulty breathing, feeling of your throat closing, facial swelling, or rash as these could be indications of a more serious allergic reaction  Please follow-up with your primary care provider within the next 1 to 3 days for reevaluation of your symptoms and to discuss your metformin management.  Return to the ER for new or worsening symptoms including but not limited to abdominal pain, fever, inability to keep fluids down, or any other concerns.

## 2017-11-29 NOTE — ED Triage Notes (Signed)
Pt from home c/o emesis x 2 days. Pt reports that she had double dosed her metformin a few days ago. Pt states that she was given the wrong information on how to take medication and has not taken metformin in 2 days. Pt adds that she was able to eat yesterday but has started to have emesis and again this morning. Pt is A&O and in NAD

## 2017-11-30 ENCOUNTER — Ambulatory Visit: Payer: Medicare Other | Admitting: Physician Assistant

## 2017-12-02 ENCOUNTER — Ambulatory Visit: Payer: Medicare Other | Admitting: Physician Assistant

## 2017-12-02 NOTE — Progress Notes (Signed)
HPI:  Using dictation device. Unfortunately this device frequently misinterprets words/phrases.  Acute visit for follow up nausea and wt loss and diarrhea. Seeing GI at [facility] for multiple gastric polyps. Long hx of intermittent diarrhea., IBS, GERD. Reported after polypectomy summer of 2019 several months of increased nausea, diarrhea and anorexia with > 10lb wt reduction. At last visit she reported symptoms improved, but given wt reduction I advised follow up with her gastroenterologist and we did a lab evaluation that was unremarkable other then very mildly elevated LFTs. She then realized after that visit that she was taking her metformin incorrectly - 2000mg  bid instead of 1000mg  bid. She was worried about this and presented to the ER. They advised she hold her metformin for now. Today reports she is doing better.  She has not had any further vomiting or diarrhea since her visit to the emergency room.  She currently is taking the metformin twice daily.  She is trying to eat healthier.  She has had mild nausea.  Feels this is better today.  Denies melena, hematochezia or abdominal pain.  ROS: See pertinent positives and negatives per HPI.  Past Medical History:  Diagnosis Date  . Allergic rhinitis   . Allergy   . Anemia   . Chronic low back pain   . DDD (degenerative disc disease), lumbar   . GERD (gastroesophageal reflux disease)    w/ hx diaphragmatic hernia  . Glaucoma   . GLAUCOMA NOS 03/11/2006   Qualifier: Diagnosis of  By: Diona Browner MD, Amy    . Heart murmur   . Hx of fracture    R arm, foot  . Hyperlipemia   . Hypertension   . IBS (irritable bowel syndrome)   . Low back pain 11/22/2012  . Meningioma 12/20/2001   Repeat MR from 2017 - radiology feels not a meningioma, but rather "Thickening of the anterior falx appears to be related to degenerative ossification and not meningioma." HCPOA opted not to do repeat imaging with CT for the possible hemangioma and declined further  evaluation of this.  . Meningioma ([place])    ant falx, stable on prior imaging  . Moderate intellectual disabilities   . Murmur   . Obesity   . Osteoarthrosis, unspecified whether generalized or localized, ankle and foot   . Sarcoidosis    difuse bony lesions and LDA, dx by biopsy in 2016  . Transaminitis   . Type II or unspecified type diabetes mellitus without mention of complication, not stated as uncontrolled     Past Surgical History:  Procedure Laterality Date  . CATARACT EXTRACTION Bilateral    Dr Katy Fitch  . COLONOSCOPY  04/15/2011  . ELBOW SURGERY     right elbow  . LIPOMA EXCISION Left [id number]   posterior left axilla  . TOTAL ABDOMINAL HYSTERECTOMY  03/05/1992   leiomyoma and cellular leiomyoma    Family History  Problem Relation Age of Onset  . Diabetes Mother   . Stroke Mother   . Hypertension Mother   . Stroke Father   . Diabetes Maternal Aunt   . Heart disease Maternal Aunt         (had pacemaker)  . Diabetes Maternal Grandmother   . Stroke Maternal Grandmother   . Hypertension Maternal Grandmother   . Colon cancer Neg Hx     SOCIAL HX: See HPI   Current Outpatient Medications:  .  acetaminophen (TYLENOL) 500 MG tablet, Take 500 mg by mouth as needed for mild pain. , Disp: ,  Rfl:  .  BUTENAFINE HCL EX, Apply topically as needed., Disp: , Rfl:  .  diltiazem (CARDIZEM CD) 240 MG 24 hr capsule, TAKE ONE CAPSULE BY MOUTH EVERY DAY, Disp: 90 capsule, Rfl: 2 .  ferrous sulfate 325 (65 FE) MG tablet, Take 1 tablet (325 mg total) daily with breakfast by mouth., Disp: 90 tablet, Rfl: 3 .  fluticasone (FLONASE) 50 MCG/ACT nasal spray, Place into both nostrils daily., Disp: , Rfl:  .  hydrochlorothiazide (MICROZIDE) 12.5 MG capsule, TAKE 1 CAPSULE BY MOUTH ONCE DAILY, Disp: 90 capsule, Rfl: 2 .  hydrocortisone 2.5 % ointment, APPLY TO AFFECTED AREA TWICE A DAY, Disp: , Rfl: 0 .  ipratropium (ATROVENT) 0.06 % nasal spray, PLACE 2 SPRAYS INTO BOTH NOSTRILS 4 (FOUR)  TIMES DAILY., Disp: 45 mL, Rfl: 2 .  ketoconazole (NIZORAL) 2 % shampoo, APPLY TO AFFECTED AREA TWICE A WEEK AS DIRECTED, Disp: 120 mL, Rfl: 5 .  KLOR-CON M20 20 MEQ tablet, TAKE 1 TABLET (20 MEQ TOTAL) BY MOUTH DAILY., Disp: 90 tablet, Rfl: 1 .  latanoprost (XALATAN) 0.005 % ophthalmic solution, Place 1 drop into both eyes at bedtime. , Disp: , Rfl:  .  Loperamide-Simethicone 2-125 MG TABS, Take by mouth as needed., Disp: , Rfl:  .  loratadine (CLARITIN) 10 MG tablet, Take 10 mg by mouth daily as needed for allergies., Disp: , Rfl:  .  meloxicam (MOBIC) 15 MG tablet, TAKE 1 TABLET BY MOUTH EVERY DAY, Disp: 90 tablet, Rfl: 0 .  metFORMIN (GLUCOPHAGE) 1000 MG tablet, Take 1 tablet (1,000 mg total) by mouth daily with breakfast., Disp: 90 tablet, Rfl: 1 .  omeprazole (PRILOSEC) 20 MG capsule, TAKE 1 CAPSULE (20 MG TOTAL) BY MOUTH 2 (TWO) TIMES DAILY., Disp: 180 capsule, Rfl: 1 .  ondansetron (ZOFRAN ODT) 4 MG disintegrating tablet, Take 1 tablet (4 mg total) by mouth every 8 (eight) hours as needed for nausea or vomiting., Disp: 5 tablet, Rfl: 0 .  pravastatin (PRAVACHOL) 40 MG tablet, TAKE 1 TABLET BY MOUTH EVERY EVENING, Disp: 90 tablet, Rfl: 1 .  Vitamins-Lipotropics ([employer] INNER EAR PLUS PO), Take by mouth. Takes 2 in the am & 2 in the pm, Disp: , Rfl:   EXAM:  Vitals:   12/03/17 0751  BP: 122/78  Pulse: 92  Temp: 97.7 F (36.5 C)    Body mass index is 33.86 kg/m.  GENERAL: vitals reviewed and listed above, alert, oriented, appears well hydrated and in no acute distress  HEENT: atraumatic, conjunttiva clear, no obvious abnormalities on inspection of external nose and ears  NECK: no obvious masses on inspection  LUNGS: clear to auscultation bilaterally, no wheezes, rales or rhonchi, good air movement  CV: HRRR, no peripheral edema  ABD:BS+, soft, NTTP  MS: moves all extremities without noticeable abnormality  PSYCH: pleasant and cooperative, no obvious depression or  anxiety  ASSESSMENT AND PLAN:  Discussed the following assessment and plan:  Nausea and vomiting, intractability of vomiting not specified, unspecified vomiting type  Type 2 diabetes mellitus without complication, without long-term current use of insulin (HCC)  Morbid obesity (HCC)  Hypertension associated with diabetes (HCC)  -Decrease Metformin further to 1000mg  daily for the next 3 weeks, follow up and recheck LFTs, weight and symptoms in 3 weeks. If symptoms and weight loss not stabilized advised will need to see GI to ensure no other etiology. -Advised to keep a log of symptoms -Recommended a healthy low sugar diet -Monitor blood pressure, better on recheck -Patient advised  to return or notify a doctor immediately if symptoms worsen or persist or new concerns arise.  Patient Instructions  BEFORE YOU LEAVE: -follow up: 3 weeks  Only take the Metformin once daily for the next 3 weeks.  Eat 3-5 small and healthy meals daily. Avoid sugar, sweets and simple carbs. Drink only water.  Take Vit D3 1000 IU daily.   Keep a log if you have any further vomiting or diarrhea.  I hope you are feeling better soon! Seek care promptly if your symptoms worsen, new concerns arise or you are not improving with treatment.      [name], DO

## 2017-12-03 ENCOUNTER — Ambulatory Visit (INDEPENDENT_AMBULATORY_CARE_PROVIDER_SITE_OTHER): Payer: Medicare Other | Admitting: Family Medicine

## 2017-12-03 ENCOUNTER — Encounter: Payer: Self-pay | Admitting: Family Medicine

## 2017-12-03 ENCOUNTER — Ambulatory Visit: Payer: Self-pay | Admitting: *Deleted

## 2017-12-03 VITALS — BP 122/78 | HR 92 | Temp 97.7°F | Ht 62.0 in | Wt 185.1 lb

## 2017-12-03 DIAGNOSIS — R112 Nausea with vomiting, unspecified: Secondary | ICD-10-CM

## 2017-12-03 DIAGNOSIS — I10 Essential (primary) hypertension: Secondary | ICD-10-CM

## 2017-12-03 DIAGNOSIS — E1159 Type 2 diabetes mellitus with other circulatory complications: Secondary | ICD-10-CM

## 2017-12-03 DIAGNOSIS — E119 Type 2 diabetes mellitus without complications: Secondary | ICD-10-CM

## 2017-12-03 MED ORDER — METFORMIN HCL 1000 MG PO TABS: 1000.0000 mg | ORAL_TABLET | Freq: Every day | ORAL | 1 refills | Status: DC

## 2017-12-03 NOTE — Patient Instructions (Addendum)
BEFORE YOU LEAVE: -follow up: 3 weeks  Only take the Metformin once daily for the next 3 weeks.  Eat 3-5 small and healthy meals daily. Avoid sugar, sweets and simple carbs. Drink only water.  Take Vit D3 1000 IU daily.   Keep a log if you have any further vomiting or diarrhea.  I hope you are feeling better soon! Seek care promptly if your symptoms worsen, new concerns arise or you are not improving with treatment.

## 2017-12-03 NOTE — Telephone Encounter (Signed)
Pt seen in the office on today with Dr. Maudie Mercury and had a question on how to take Metformin. Explained to pt per office notes of Dr. Maudie Mercury during visit today to only take Metformin 1000 mg daily. Also explained to pt per notes of Dr. Maudie Mercury that she was to take the over the counter Vit D3 1000 iu daily as well.  Pt asking if she would have diarrhea up until her appt with podiatry on next Friday.Explained to pt that she may not have have diarrhea until her appt on Friday with starting Metformin.  Pt scheduled for appt with Dr. Maudie Mercury on next Thursday. Explained to pt that if she starts to experience more vomiting and diarrhea during the weekend to seek treatment at Urgent Care/ED. Pt verbalized understanding.  Reason for Disposition . Caller has medication question only, adult not sick, and triager answers question  Answer Assessment - Initial Assessment Questions 1. SYMPTOMS: "Do you have any symptoms?"     No symptoms presently 2. SEVERITY: If symptoms are present, ask "Are they mild, moderate or severe?"     n/a  Protocols used: MEDICATION QUESTION CALL-A-AH

## 2017-12-04 ENCOUNTER — Telehealth: Payer: Self-pay | Admitting: Family Medicine

## 2017-12-04 NOTE — Telephone Encounter (Signed)
Patient requesting a call back this afternoon.  She said after she takes her Metformin she feels like she is going to throw up and after she eats she feels like her stomach is burning.   Patient won't be home until this afternoon for a call back.

## 2017-12-04 NOTE — Telephone Encounter (Signed)
I called the pt and informed her of the message below

## 2017-12-04 NOTE — Telephone Encounter (Signed)
I would recommend that she take just a half tablet of the metformin and take at bedtime. If still having symptoms with this then hold metformin through the rest of the weekend and give [place] a call back on Monday. Certainly let us know if any worsening of symptoms in the meanwhile.

## 2017-12-09 NOTE — Progress Notes (Signed)
HPI:  Using dictation device. Unfortunately this device frequently misinterprets words/phrases.  Linda Thompson is a pleasant 62 yo here for follow up.reports thoughts she was supposed to follow up and wants to confirm metformin dosing. Was still having mild diarrhea with 1 tablet of her metformin twice daily. Came here and another doctor told her to take a half tablet twice daily. Reports has felt great on that does. No diarrhea, NV or abd pain.   ROS: See pertinent positives and negatives per HPI.  Past Medical History:  Diagnosis Date  . Allergic rhinitis   . Allergy   . Anemia   . Chronic low back pain   . DDD (degenerative disc disease), lumbar   . GERD (gastroesophageal reflux disease)    w/ hx diaphragmatic hernia  . Glaucoma   . GLAUCOMA NOS 03/11/2006   Qualifier: Diagnosis of  By: Diona Browner MD, Amy    . Heart murmur   . Hx of fracture    R arm, foot  . Hyperlipemia   . Hypertension   . IBS (irritable bowel syndrome)   . Low back pain 11/22/2012  . Meningioma 12/20/2001   Repeat MR from 2017 - radiology feels not a meningioma, but rather "Thickening of the anterior falx appears to be related to degenerative ossification and not meningioma." HCPOA opted not to do repeat imaging with CT for the possible hemangioma and declined further evaluation of this.  . Meningioma ([place])    ant falx, stable on prior imaging  . Moderate intellectual disabilities   . Murmur   . Obesity   . Osteoarthrosis, unspecified whether generalized or localized, ankle and foot   . Sarcoidosis    difuse bony lesions and LDA, dx by biopsy in 2016  . Transaminitis   . Type II or unspecified type diabetes mellitus without mention of complication, not stated as uncontrolled     Past Surgical History:  Procedure Laterality Date  . CATARACT EXTRACTION Bilateral    Dr Katy Fitch  . COLONOSCOPY  04/15/2011  . ELBOW SURGERY     right elbow  . LIPOMA EXCISION Left [id number]   posterior left axilla  . TOTAL  ABDOMINAL HYSTERECTOMY  03/05/1992   leiomyoma and cellular leiomyoma    Family History  Problem Relation Age of Onset  . Diabetes Mother   . Stroke Mother   . Hypertension Mother   . Stroke Father   . Diabetes Maternal Aunt   . Heart disease Maternal Aunt         (had pacemaker)  . Diabetes Maternal Grandmother   . Stroke Maternal Grandmother   . Hypertension Maternal Grandmother   . Colon cancer Neg Hx     SOCIAL HX: see hpi   Current Outpatient Medications:  .  acetaminophen (TYLENOL) 500 MG tablet, Take 500 mg by mouth as needed for mild pain. , Disp: , Rfl:  .  BUTENAFINE HCL EX, Apply topically as needed., Disp: , Rfl:  .  diltiazem (CARDIZEM CD) 240 MG 24 hr capsule, TAKE ONE CAPSULE BY MOUTH EVERY DAY, Disp: 90 capsule, Rfl: 2 .  ferrous sulfate 325 (65 FE) MG tablet, Take 1 tablet (325 mg total) daily with breakfast by mouth., Disp: 90 tablet, Rfl: 3 .  fluticasone (FLONASE) 50 MCG/ACT nasal spray, Place into both nostrils daily., Disp: , Rfl:  .  hydrochlorothiazide (MICROZIDE) 12.5 MG capsule, TAKE 1 CAPSULE BY MOUTH ONCE DAILY, Disp: 90 capsule, Rfl: 2 .  hydrocortisone 2.5 % ointment, APPLY TO AFFECTED  AREA TWICE A DAY, Disp: , Rfl: 0 .  ipratropium (ATROVENT) 0.06 % nasal spray, PLACE 2 SPRAYS INTO BOTH NOSTRILS 4 (FOUR) TIMES DAILY., Disp: 45 mL, Rfl: 2 .  ketoconazole (NIZORAL) 2 % shampoo, APPLY TO AFFECTED AREA TWICE A WEEK AS DIRECTED, Disp: 120 mL, Rfl: 5 .  KLOR-CON M20 20 MEQ tablet, TAKE 1 TABLET (20 MEQ TOTAL) BY MOUTH DAILY., Disp: 90 tablet, Rfl: 1 .  latanoprost (XALATAN) 0.005 % ophthalmic solution, Place 1 drop into both eyes at bedtime. , Disp: , Rfl:  .  Loperamide-Simethicone 2-125 MG TABS, Take by mouth as needed., Disp: , Rfl:  .  loratadine (CLARITIN) 10 MG tablet, Take 10 mg by mouth daily as needed for allergies., Disp: , Rfl:  .  meloxicam (MOBIC) 15 MG tablet, TAKE 1 TABLET BY MOUTH EVERY DAY, Disp: 90 tablet, Rfl: 0 .  metFORMIN (GLUCOPHAGE)  1000 MG tablet, Take 1 tablet (1,000 mg total) by mouth daily with breakfast., Disp: 90 tablet, Rfl: 1 .  omeprazole (PRILOSEC) 20 MG capsule, TAKE 1 CAPSULE (20 MG TOTAL) BY MOUTH 2 (TWO) TIMES DAILY., Disp: 180 capsule, Rfl: 1 .  ondansetron (ZOFRAN ODT) 4 MG disintegrating tablet, Take 1 tablet (4 mg total) by mouth every 8 (eight) hours as needed for nausea or vomiting., Disp: 5 tablet, Rfl: 0 .  pravastatin (PRAVACHOL) 40 MG tablet, TAKE 1 TABLET BY MOUTH EVERY EVENING, Disp: 90 tablet, Rfl: 1 .  Vitamins-Lipotropics ([employer] INNER EAR PLUS PO), Take by mouth. Takes 2 in the am & 2 in the pm, Disp: , Rfl:   EXAM:  Vitals:   12/10/17 0939  BP: 118/72  Pulse: 79  Temp: 97.6 F (36.4 C)    Body mass index is 33.96 kg/m.  GENERAL: vitals reviewed and listed above, alert, oriented, appears well hydrated and in no acute distress  HEENT: atraumatic, conjunttiva clear, no obvious abnormalities on inspection of external nose and ears  NECK: no obvious masses on inspection  LUNGS: clear to auscultation bilaterally, no wheezes, rales or rhonchi, good air movement  CV: HRRR, no peripheral edema  MS: moves all extremities without noticeable abnormality  PSYCH: pleasant and cooperative, no obvious depression or anxiety  ASSESSMENT AND PLAN:  Discussed the following assessment and plan:  Type 2 diabetes mellitus without complication, without long-term current use of insulin (HCC)  Diarrhea, unspecified type  Moderate intellectual disabilities  Morbid obesity ([place])  -confirmed dosing metformin - will do 2 weeks half tablet then back to regular dose if tolerated -healthy diet -glad feeling better -monitor weight -GI eval if more symptoms or if lose wt unintentionally -lifestyle recs -Patient advised to return or notify a doctor immediately if symptoms worsen or persist or new concerns arise.  Patient Instructions  1/2 tablet of the metformin twice daily for 2 weeks. Then 1  tablet twice daily.  Eat a healthy low sugar diet.  Get regular exercise   [name], DO

## 2017-12-10 ENCOUNTER — Ambulatory Visit (INDEPENDENT_AMBULATORY_CARE_PROVIDER_SITE_OTHER): Payer: Medicare Other | Admitting: Family Medicine

## 2017-12-10 ENCOUNTER — Encounter: Payer: Self-pay | Admitting: Family Medicine

## 2017-12-10 VITALS — BP 118/72 | HR 79 | Temp 97.6°F | Ht 62.0 in | Wt 185.7 lb

## 2017-12-10 DIAGNOSIS — F71 Moderate intellectual disabilities: Secondary | ICD-10-CM

## 2017-12-10 DIAGNOSIS — E119 Type 2 diabetes mellitus without complications: Secondary | ICD-10-CM | POA: Diagnosis not present

## 2017-12-10 DIAGNOSIS — R197 Diarrhea, unspecified: Secondary | ICD-10-CM | POA: Diagnosis not present

## 2017-12-10 NOTE — Patient Instructions (Signed)
1/2 tablet of the metformin twice daily for 2 weeks. Then 1 tablet twice daily.  Eat a healthy low sugar diet.  Get regular exercise

## 2017-12-11 ENCOUNTER — Ambulatory Visit (INDEPENDENT_AMBULATORY_CARE_PROVIDER_SITE_OTHER): Payer: Medicare Other | Admitting: Podiatry

## 2017-12-11 DIAGNOSIS — M79671 Pain in right foot: Secondary | ICD-10-CM | POA: Diagnosis not present

## 2017-12-11 DIAGNOSIS — M19079 Primary osteoarthritis, unspecified ankle and foot: Secondary | ICD-10-CM

## 2017-12-11 DIAGNOSIS — M79672 Pain in left foot: Secondary | ICD-10-CM | POA: Diagnosis not present

## 2017-12-11 MED ORDER — DICLOFENAC SODIUM 1 % TD GEL: 2.0000 g | Freq: Four times a day (QID) | TRANSDERMAL | 1 refills | Status: DC

## 2017-12-11 NOTE — Progress Notes (Signed)
Subjective:  Patient ID: Linda Thompson, female    DOB: [birth year],  MRN: [id number]  Chief Complaint  Patient presents with  . Foot Pain    patient here for refill of mobic 15mg , 90 day supply    62 y.o. female presents with the above complaint. Requests refill Mobic.  Endorses that she recently underwent endoscopy which showed that she had 4polyps and "4 screws" in her stomach.  Review of Systems: Negative except as noted in the HPI. Denies N/V/F/Ch.  Past Medical History:  Diagnosis Date  . Allergic rhinitis   . Allergy   . Anemia   . Chronic low back pain   . DDD (degenerative disc disease), lumbar   . GERD (gastroesophageal reflux disease)    w/ hx diaphragmatic hernia  . Glaucoma   . GLAUCOMA NOS 03/11/2006   Qualifier: Diagnosis of  By: Diona Browner MD, Amy    . Heart murmur   . Hx of fracture    R arm, foot  . Hyperlipemia   . Hypertension   . IBS (irritable bowel syndrome)   . Low back pain 11/22/2012  . Meningioma 12/20/2001   Repeat MR from 2017 - radiology feels not a meningioma, but rather "Thickening of the anterior falx appears to be related to degenerative ossification and not meningioma." HCPOA opted not to do repeat imaging with CT for the possible hemangioma and declined further evaluation of this.  . Meningioma ([place])    ant falx, stable on prior imaging  . Moderate intellectual disabilities   . Murmur   . Obesity   . Osteoarthrosis, unspecified whether generalized or localized, ankle and foot   . Sarcoidosis    difuse bony lesions and LDA, dx by biopsy in 2016  . Transaminitis   . Type II or unspecified type diabetes mellitus without mention of complication, not stated as uncontrolled     Current Outpatient Medications:  .  acetaminophen (TYLENOL) 500 MG tablet, Take 500 mg by mouth as needed for mild pain. , Disp: , Rfl:  .  BUTENAFINE HCL EX, Apply topically as needed., Disp: , Rfl:  .  diclofenac sodium (VOLTAREN) 1 % GEL, Apply 2 g topically 4 (four)  times daily., Disp: 100 g, Rfl: 1 .  diltiazem (CARDIZEM CD) 240 MG 24 hr capsule, TAKE ONE CAPSULE BY MOUTH EVERY DAY, Disp: 90 capsule, Rfl: 2 .  ferrous sulfate 325 (65 FE) MG tablet, Take 1 tablet (325 mg total) daily with breakfast by mouth., Disp: 90 tablet, Rfl: 3 .  fluticasone (FLONASE) 50 MCG/ACT nasal spray, Place into both nostrils daily., Disp: , Rfl:  .  hydrochlorothiazide (MICROZIDE) 12.5 MG capsule, TAKE 1 CAPSULE BY MOUTH ONCE DAILY, Disp: 90 capsule, Rfl: 2 .  hydrocortisone 2.5 % ointment, APPLY TO AFFECTED AREA TWICE A DAY, Disp: , Rfl: 0 .  ipratropium (ATROVENT) 0.06 % nasal spray, PLACE 2 SPRAYS INTO BOTH NOSTRILS 4 (FOUR) TIMES DAILY., Disp: 45 mL, Rfl: 2 .  ketoconazole (NIZORAL) 2 % shampoo, APPLY TO AFFECTED AREA TWICE A WEEK AS DIRECTED, Disp: 120 mL, Rfl: 5 .  KLOR-CON M20 20 MEQ tablet, TAKE 1 TABLET (20 MEQ TOTAL) BY MOUTH DAILY., Disp: 90 tablet, Rfl: 1 .  latanoprost (XALATAN) 0.005 % ophthalmic solution, Place 1 drop into both eyes at bedtime. , Disp: , Rfl:  .  Loperamide-Simethicone 2-125 MG TABS, Take by mouth as needed., Disp: , Rfl:  .  loratadine (CLARITIN) 10 MG tablet, Take 10 mg by mouth daily as  needed for allergies., Disp: , Rfl:  .  meloxicam (MOBIC) 15 MG tablet, TAKE 1 TABLET BY MOUTH EVERY DAY, Disp: 90 tablet, Rfl: 0 .  metFORMIN (GLUCOPHAGE) 1000 MG tablet, Take 1 tablet (1,000 mg total) by mouth daily with breakfast., Disp: 90 tablet, Rfl: 1 .  omeprazole (PRILOSEC) 20 MG capsule, TAKE 1 CAPSULE (20 MG TOTAL) BY MOUTH 2 (TWO) TIMES DAILY., Disp: 180 capsule, Rfl: 1 .  ondansetron (ZOFRAN ODT) 4 MG disintegrating tablet, Take 1 tablet (4 mg total) by mouth every 8 (eight) hours as needed for nausea or vomiting., Disp: 5 tablet, Rfl: 0 .  pravastatin (PRAVACHOL) 40 MG tablet, TAKE 1 TABLET BY MOUTH EVERY EVENING, Disp: 90 tablet, Rfl: 1 .  Vitamins-Lipotropics ([employer] INNER EAR PLUS PO), Take by mouth. Takes 2 in the am & 2 in the pm, Disp: , Rfl:    Social History   Tobacco Use  Smoking Status Never Smoker  Smokeless Tobacco Never Used    Allergies  Allergen Reactions  . Ace Inhibitors Cough  . Amoxicillin-Pot Clavulanate Diarrhea  . Augmentin [Amoxicillin-Pot Clavulanate] Other (See Comments)    diarrhea  . Azithromycin Diarrhea  . Ceftin [Cefuroxime Axetil]     diarrhea  . Cefuroxime Diarrhea and Other (See Comments)    Generalized weakness  . Cyclobenzaprine Hcl Diarrhea  . Flexeril [Cyclobenzaprine] Diarrhea  . Flonase [Fluticasone Propionate] Other (See Comments)    Nose bleed  . Fluticasone Other (See Comments)    Nose bleed  . Ibuprofen Other (See Comments)    REACTION: vomiting   Objective:  There were no vitals filed for this visit. There is no height or weight on file to calculate BMI. Constitutional Well developed. Well nourished.  Vascular Dorsalis pedis pulses palpable bilaterally. Posterior tibial pulses palpable bilaterally. Capillary refill normal to all digits.  No cyanosis or clubbing noted. Pedal hair growth normal.  Neurologic Normal speech. Oriented to person, place, and time. Epicritic sensation to light touch grossly present bilaterally.  Dermatologic Nails well groomed and normal in appearance. No open wounds. No skin lesions.  Orthopedic: Normal joint ROM without pain or crepitus bilaterally. No visible deformities. No bony tenderness.   Radiographs: None Assessment:   1. Arthritis of foot   2. Pain in both feet    Plan:  Patient was evaluated and treated and all questions answered.  Arthritis Both feet -Not presently hurting -Discussed that meloxicam is only as needed and not indicatd at this time due to GI issues -Rx Voltaren gel instead. Educated on use. -F/u PRN   15 minutes of face to face time were spent with the patient. >50% of this was spent on counseling and coordination of care. Specifically discussed with patient the above diagnoses and overall treatment plan.    Return if symptoms worsen or fail to improve.

## 2017-12-21 NOTE — Progress Notes (Signed)
Subjective:   Linda Thompson is a 62 y.o. female who presents for Medicare Annual (Subsequent) preventive examination.  Reports health as GOOD States the drugstore could not fill her meds and had to make an apt with the doctor. Went 11/22 / dr. Maudie Mercury 11/21  The "man" at [facility] told her to stop her foot medicine.   Oct 4th had a colonoscopy- had 4 polyps ? Not taking her potassium     Diet A1c down to 6.4; has been ill with diarrhea  Had grits and eggs for breakfast last year Has no issues getting groceries   Exercise Does not exercise   There are no preventive care reminders to display for this patient.  Shingrix 09/22/2017 and second one 12/21/2017   Mammogram 09/2017 Pap in 2015 will see MD 04/2018 per her report for pap   Sees GYN [name] seen in Sept for yeast infection She referred to a specialist but not sure who     Eye exam 05/2017 and no retinopathy  Has glaucoma and takes her eye drops  Dr. Katy Fitch   Thinks she lost hearing loss but then states she takes nasal spray  Cardiac Risk Factors include: advanced age (>44mn, >>8women);family history of premature cardiovascular disease;diabetes mellitus;dyslipidemia;hypertension;sedentary lifestyle   Dental - gets her dental work regularly  Last exam 08/2016; Dr. BUlla GalloDDS   Stressors: every thing is good   Sleep patterns: sleeps well at hs   Pain no pain   Med changes Metformin - 2 weeks 1/2 tab and go back to regular dose      Objective:     Vitals: BP 140/90   Pulse 94   Ht _0  (1.575 m)   Wt 183 lb (83 kg)   LMP 01/21/1992 (Approximate)   SpO2 94%   BMI 33.47 kg/m   Body mass index is 33.47 kg/m.  Advanced Directives 12/22/2017 11/29/2017 12/19/2016 02/12/2016 01/03/2016 12/11/2015 11/29/2015  Does Patient Have a Medical Advance Directive? Yes Yes Yes No Yes No Yes  Type of Advance Directive - Living will;Healthcare Power of [place]  will;Out of facility DNR (pink MOST or yellow form) [place] will  Does patient want to make changes to medical advance directive? - No - Patient declined - - No - Patient declined No - Patient declined No - Patient declined  Copy of Healthcare Power of Attorney in Chart? - No - copy requested - - Yes - No - copy requested  Would patient like information on creating a medical advance directive? - - - No - Patient declined - No - Patient declined -    Tobacco Social History   Tobacco Use  Smoking Status Never Smoker  Smokeless Tobacco Never Used     Counseling given: Yes   Clinical Intake:   Past Medical History:  Diagnosis Date  . Allergic rhinitis   . Allergy   . Anemia   . Chronic low back pain   . DDD (degenerative disc disease), lumbar   . GERD (gastroesophageal reflux disease)    w/ hx diaphragmatic hernia  . Glaucoma   . GLAUCOMA NOS 03/11/2006   Qualifier: Diagnosis of  By: BDiona BrownerMD, Amy    . Heart murmur   . Hx of fracture    R arm, foot  . Hyperlipemia   . Hypertension   . IBS (irritable bowel syndrome)   . Low back pain  11/22/2012  . Meningioma 12/20/2001   Repeat MR from 2017 - radiology feels not a meningioma, but rather "Thickening of the anterior falx appears to be related to degenerative ossification and not meningioma." HCPOA opted not to do repeat imaging with CT for the possible hemangioma and declined further evaluation of this.  . Meningioma ([place])    ant falx, stable on prior imaging  . Moderate intellectual disabilities   . Murmur   . Obesity   . Osteoarthrosis, unspecified whether generalized or localized, ankle and foot   . Sarcoidosis    difuse bony lesions and LDA, dx by biopsy in 2016  . Transaminitis   . Type II or unspecified type diabetes mellitus without mention of complication, not stated as uncontrolled    Past Surgical History:  Procedure Laterality Date  . CATARACT  EXTRACTION Bilateral    Dr Katy Fitch  . COLONOSCOPY  04/15/2011  . ELBOW SURGERY     right elbow  . LIPOMA EXCISION Left [id number]   posterior left axilla  . TOTAL ABDOMINAL HYSTERECTOMY  03/05/1992   leiomyoma and cellular leiomyoma   Family History  Problem Relation Age of Onset  . Diabetes Mother   . Stroke Mother   . Hypertension Mother   . Stroke Father   . Diabetes Maternal Aunt   . Heart disease Maternal Aunt         (had pacemaker)  . Diabetes Maternal Grandmother   . Stroke Maternal Grandmother   . Hypertension Maternal Grandmother   . Colon cancer Neg Hx    Social History   Socioeconomic History  . Marital status: Single    Spouse name: Not on file  . Number of children: 0  . Years of education: Not on file  . Highest education level: Not on file  Occupational History  . Occupation: Disabled (from arm fracture)  Social Needs  . Financial resource strain: Not on file  . Food insecurity:    Worry: Not on file    Inability: Not on file  . Transportation needs:    Medical: Not on file    Non-medical: Not on file  Tobacco Use  . Smoking status: Never Smoker  . Smokeless tobacco: Never Used  Substance and Sexual Activity  . Alcohol use: No    Alcohol/week: 0.0 standard drinks  . Drug use: No  . Sexual activity: Never    Birth control/protection: Surgical    Comment: Hyst--TAH--unsure if has ovaries  Lifestyle  . Physical activity:    Days per week: Not on file    Minutes per session: Not on file  . Stress: Not on file  Relationships  . Social connections:    Talks on phone: Not on file    Gets together: Not on file    Attends religious service: Not on file    Active member of club or organization: Not on file    Attends meetings of clubs or organizations: Not on file    Relationship status: Not on file  Other Topics Concern  . Not on file  Social History Narrative         Currently-disability for arm fracture      Mental Retardation, but is able to  drive to places she is comfortable with and prepare her own meals and manage most of her own affairs.      [name] (neighbor) is POA/HCPOA      Reports she gets regular exercise and tries to eat healthy  Outpatient Encounter Medications as of 12/22/2017  Medication Sig  . acetaminophen (TYLENOL) 500 MG tablet Take 500 mg by mouth as needed for mild pain.   Marland Kitchen diclofenac sodium (VOLTAREN) 1 % GEL Apply 2 g topically 4 (four) times daily.  Marland Kitchen diltiazem (CARDIZEM CD) 240 MG 24 hr capsule TAKE ONE CAPSULE BY MOUTH EVERY DAY  . fluticasone (FLONASE) 50 MCG/ACT nasal spray Place into both nostrils daily.  . hydrochlorothiazide (MICROZIDE) 12.5 MG capsule TAKE 1 CAPSULE BY MOUTH ONCE DAILY  . hydrocortisone 2.5 % ointment APPLY TO AFFECTED AREA TWICE A DAY  . ipratropium (ATROVENT) 0.06 % nasal spray PLACE 2 SPRAYS INTO BOTH NOSTRILS 4 (FOUR) TIMES DAILY.  Marland Kitchen ketoconazole (NIZORAL) 2 % shampoo APPLY TO AFFECTED AREA TWICE A WEEK AS DIRECTED  . latanoprost (XALATAN) 0.005 % ophthalmic solution Place 1 drop into both eyes at bedtime.   Marland Kitchen loratadine (CLARITIN) 10 MG tablet Take 10 mg by mouth daily as needed for allergies.  . metFORMIN (GLUCOPHAGE) 1000 MG tablet Take 1 tablet (1,000 mg total) by mouth daily with breakfast.  . omeprazole (PRILOSEC) 20 MG capsule TAKE 1 CAPSULE (20 MG TOTAL) BY MOUTH 2 (TWO) TIMES DAILY.  . pravastatin (PRAVACHOL) 40 MG tablet TAKE 1 TABLET BY MOUTH EVERY EVENING  . Vitamins-Lipotropics ([employer] INNER EAR PLUS PO) Take by mouth. Takes 2 in the am & 2 in the pm  . BUTENAFINE HCL EX Apply topically as needed.  . ferrous sulfate 325 (65 FE) MG tablet Take 1 tablet (325 mg total) daily with breakfast by mouth.  Marland Kitchen KLOR-CON M20 20 MEQ tablet TAKE 1 TABLET (20 MEQ TOTAL) BY MOUTH DAILY. (Patient not taking: Reported on 12/22/2017)  . Loperamide-Simethicone 2-125 MG TABS Take by mouth as needed.  . meloxicam (MOBIC) 15 MG tablet TAKE 1 TABLET BY MOUTH EVERY DAY  . ondansetron  (ZOFRAN ODT) 4 MG disintegrating tablet Take 1 tablet (4 mg total) by mouth every 8 (eight) hours as needed for nausea or vomiting. (Patient not taking: Reported on 12/22/2017)  . [DISCONTINUED] Calcium Carbonate-Vitamin D (CALCIUM-VITAMIN D) 500-200 MG-UNIT per tablet Take 1 tablet by mouth 2 (two) times daily with a meal.    . [DISCONTINUED] diltiazem (TIAZAC) 240 MG 24 hr capsule Take 1 capsule (240 mg total) by mouth daily.   No facility-administered encounter medications on file as of 12/22/2017.     Activities of Daily Living In your present state of health, do you have any difficulty performing the following activities: 12/22/2017  Hearing? N  Vision? N  Comment is being treated for glaucoma  Difficulty concentrating or making decisions? N  Walking or climbing stairs? Y  Comment has to take her time  Dressing or bathing? N  Doing errands, shopping? N  Preparing Food and eating ? N  Using the Toilet? N  In the past six months, have you accidently leaked urine? N  Do you have problems with loss of bowel control? N  Managing your Medications? N  Managing your Finances? N  Housekeeping or managing your Housekeeping? N  Some recent data might be hidden    Patient Care Team: [name], DO as PCP - General (Family Medicine)    Assessment:   This is a routine wellness examination for Shontia.  Exercise Activities and Dietary recommendations Current Exercise Habits: Home exercise routine, Type of exercise: walking, Time (Minutes): 20, Frequency (Times/Week): 5(goes up and down hall and downtairs to check mail), Weekly Exercise (Minutes/Week): 100, Intensity: Mild  Goals    . Exercise 150 min/wk Moderate Activity     Manufacturing engineer of Services Cost  A Matter of Balance Class locations vary. Call [place] on Aging for more information.  [email or web address]-[email or web address]/ [phone] 8-Session program addressing the fear of  falling and increasing activity levels of older adults Free to minimal cost  A.C.T. By [employer] [address] [place], [place] [address].  BetaBlues.dk [phone]  Personal training, gym, classes including Silver Sneakers* and ACTion for Aging Adults Fee-based  A.H.O.Y. (Add Health to [place]) Airs on Time [employer] 13, M-F at [place]: [employer],  [place] [facility] [place],  [place], [place] [facility], [address] [facility], [place], [place], [place] [address]  [place] Location: Sharrell Ku. [employer] [place]      [phone]  [phone]  [phone]  [phone]  [phone]  [phone]  [phone]  [phone]  [phone]  [phone]    [phone] A total-body conditioning class for adults 15 and older; designed to increase muscular strength, endurance, range of movement, flexibility, balance, agility and coordination Free  [place] [facility] [place], [place] [address] [place]      [address]      [phone]      Pilate's class for individualsreturning to exercise after an injury, before or after surgery or for individuals with complex musculoskeletal issues; designed to improve strength, balance , flexibility      $15/class  [place] [address] [place], [place] [address] [email or web address] [place] classes for beginners to advanced [place], [place] [address] [email or web address] [place] Chair Exercises Free, ages 79 and older; Ages 28-59 fee based  Marvia Pickles, [facility] [address] [place], [place] [address] Seniorcenter_1 .Beverlee Nims [phone]  A.H.O.Y. Tai Chi Fee-based Donation based or free  [place]owner Class locations vary.  Call or email Angela Burke or view website for more information. [email or web address] [email or web address].com.[email or web address] [phone] Ongoing classes at local YMCAs and gyms Fee-based  Silver Sneakers A.C.T. By [place] [employer]: [place] [phone]  [phone] Classes designed for older adults who want to improve their strength, flexibility, balance and endurance.   Silver sneakers is covered by some insurance plans and includes a fitness center membership at participating locations. Find out more by calling [phone] or visiting [email or web address] Covered by some insurance plans  [facility] [place] [phone] A.H.O.Y., fitness room, personal training, fitness classes for injury prevention, strength, balance, flexibility, water fitness classes Ages 55+: $26 for 6 months; Ages 77-54: $60 for 6 months  Tai Chi for Everybody [facility] [address] [facility] [place],  [address]  [email or web address] [phone] Tai Chi classes for beginners to advanced; geared for seniors Donation Based      [employer] (Helpling Others Participate in Exercise     Loyal Gambler. Rosana Hoes,  PhD, [place] [email or web address] [place]     [phone]     A comprehensive fitness program for adults.  The program paris senior-level undergraduates Kinesiology students with adults who desire to learn how to exercise safely.  Includes a structural exercise class focusing on functional fitnesss     $100/semester in fall and spring; $75 in summer (no trainers)    *Silver Sneakers is covered by some Personal assistant and includes a  Radio producer at participating locations.  Find out more by calling [phone] or visiting [email or web address]  For additional health and human services resources for senior adults, please contact SeniorLine at [phone] in [place] and [place] at [phone] in all other areas.    . patient     Will discuss Maintenance with PT  Will discuss how to create safe toning exercise; sitter exercise and plan the time of day and what you will use  Work on the number of times you are exercising; and limits; time frames; 15 min or 30 min   You may want to practice steps     . Patient Stated     Keeps walking and moving        Fall Risk Fall Risk  12/22/2017 12/19/2016 12/19/2016 03/21/2016 02/21/2016  Falls in the past year? 0 - Yes Yes Yes  Comment - - - Falls: (2)  12/11/15 and 02/12/16 last fall dates:  12/11/15 and 02/12/16  Number falls in past yr: - - 2 or more 2 or more 2 or more  Injury with Fall? - - - Yes Yes  Risk Factor Category  - - - High Fall Risk High Fall Risk  Risk for fall due to : - Impaired balance/gait - History of fall(s);Impaired balance/gait History of fall(s);Impaired balance/gait  Follow up - Education provided Education provided Falls evaluation completed;Education provided;Falls prevention discussed Education provided;Falls evaluation completed;Falls prevention discussed  Comment - - sprained ankle  - -    Depression Screen PHQ 2/9 Scores 12/22/2017 12/19/2016 03/21/2016 02/21/2016  PHQ - 2 Score 0 0 0 0      Cognitive Function MMSE - Mini Mental State Exam 12/22/2017 12/19/2016 12/11/2015  Not completed: (No Data) (No Data) (No Data)     Ad8 score reviewed for issues:  Issues making decisions:  Less interest in hobbies / activities:  Repeats questions, stories (family complaining):  Trouble using ordinary gadgets (microwave, computer, phone):  Forgets the month or year:   Mismanaging finances:   Remembering appts:  Daily problems with thinking and/or memory: Ad8 score is=0 Mental limitations but seems successful at independent care  She is still driving;        Immunization History  Administered Date(s) Administered  . Influenza Split 09/19/2010  . Influenza Whole 11/12/2004, 10/21/2006, 10/18/2007, 10/19/2008, 10/25/2009  . Influenza,inj,Quad PF,6+ Mos 09/17/2012, 09/19/2013, 09/13/2014, 09/06/2016, 09/14/2017  . Influenza-Unspecified 08/31/2015, 09/06/2016, 09/14/2017  . Pneumococcal Polysaccharide-23 03/10/2002, 02/08/2009  . Td 03/10/2002, 08/08/2014  . Zoster 08/08/2014  . Zoster Recombinat (Shingrix) 09/22/2017, 12/21/2017     Screening Tests Health Maintenance  Topic Date Due  . COLONOSCOPY  04/18/2018  . FOOT EXAM  04/21/2018  . HEMOGLOBIN A1C  05/27/2018  . OPHTHALMOLOGY EXAM  06/04/2018  . MAMMOGRAM  09/26/2018  . URINE MICROALBUMIN  11/27/2018  . PAP SMEAR  01/17/2019  .  TETANUS/TDAP  08/07/2024  . INFLUENZA VACCINE  Completed  . PNEUMOCOCCAL POLYSACCHARIDE VACCINE AGE 47-64 HIGH RISK  Completed  . Hepatitis C Screening  Completed  . HIV Screening  Completed         Plan:      PCP Notes  Health Maintenance Shingrix 09/22/2017 and second one 12/21/2017   Mammogram 09/2017 Pap in 2015 will see MD 04/2018 per her report for pap   Sees GYN [name] seen in Sept for yeast infection She referred to a specialist but not sure who     Eye exam 05/2017 and no retinopathy  Has glaucoma and takes her eye drops  Dr. Katy Fitch    Abnormal  Screens  BP elevated 140/90  Referrals  none  Patient concerns; Wants an extra copy of her meds Taking 1/2 of metformin and will start full dose on 12/5  Nurse Concerns; Seems to know most her meds; figured out she was taking her iron; She is not taking potassium because she thought Dr. Maudie Mercury told her to take D3 instead   Next PCP apt Today       I have personally reviewed and noted the following in the patient's chart:   . Medical and social history . Use of alcohol, tobacco or illicit drugs  . Current medications and supplements . Functional ability and status . Nutritional status . Physical activity . Advanced directives . List of other physicians . Hospitalizations, surgeries, and ER visits in previous 12 months . Vitals . Screenings to include cognitive, depression, and falls . Referrals and appointments  In addition, I have reviewed and discussed with patient certain preventive protocols, quality metrics, and best practice recommendations. A written personalized care plan for preventive services as well as general preventive health recommendations were provided to patient.     Wynetta Fines, RN  12/22/2017

## 2017-12-22 ENCOUNTER — Encounter: Payer: Self-pay | Admitting: Family Medicine

## 2017-12-22 ENCOUNTER — Ambulatory Visit (INDEPENDENT_AMBULATORY_CARE_PROVIDER_SITE_OTHER): Payer: Medicare Other | Admitting: Family Medicine

## 2017-12-22 ENCOUNTER — Ambulatory Visit (INDEPENDENT_AMBULATORY_CARE_PROVIDER_SITE_OTHER): Payer: Medicare Other

## 2017-12-22 VITALS — BP 132/78 | HR 94 | Temp 97.4°F | Ht 62.0 in | Wt 183.0 lb

## 2017-12-22 VITALS — BP 140/90 | HR 94 | Temp 97.4°F | Ht 62.0 in | Wt 183.0 lb

## 2017-12-22 DIAGNOSIS — E876 Hypokalemia: Secondary | ICD-10-CM | POA: Diagnosis not present

## 2017-12-22 DIAGNOSIS — Z0000 Encounter for general adult medical examination without abnormal findings: Secondary | ICD-10-CM

## 2017-12-22 DIAGNOSIS — I10 Essential (primary) hypertension: Secondary | ICD-10-CM

## 2017-12-22 LAB — BASIC METABOLIC PANEL
BUN: 11 mg/dL (ref 6–23)
CHLORIDE: 103 meq/L (ref 96–112)
CO2: 31 meq/L (ref 19–32)
Calcium: 9.2 mg/dL (ref 8.4–10.5)
Creatinine, Ser: 0.6 mg/dL (ref 0.40–1.20)
GFR: 107.18 mL/min (ref >60.00)
Glucose, Bld: 126 mg/dL — ABNORMAL HIGH (ref 70–99)
Potassium: 3.7 mEq/L (ref 3.5–5.1)
Sodium: 142 mEq/L (ref 135–145)

## 2017-12-22 NOTE — Progress Notes (Signed)
HPI:  Using dictation device. Unfortunately this device frequently misinterprets words/phrases.  Acute visit for questions about supplements and elevated blood pressure.  She had her annual wellness visit today with Manuela Schwartz.  Her blood pressure was a little elevated, so rechecking with me.  On recheck it is improved.  She feels well.  She is not having any further diarrhea, no chest pain or trouble breathing.  She did stop her potassium supplement a few weeks ago and she wonders if that is okay.  She had some low potassium in the hospital during emergency room visit a few weeks ago.  We will recheck today.  ROS: See pertinent positives and negatives per HPI.  Past Medical History:  Diagnosis Date  . Allergic rhinitis   . Allergy   . Anemia   . Chronic low back pain   . DDD (degenerative disc disease), lumbar   . GERD (gastroesophageal reflux disease)    w/ hx diaphragmatic hernia  . Glaucoma   . GLAUCOMA NOS 03/11/2006   Qualifier: Diagnosis of  By: Diona Browner MD, Amy    . Heart murmur   . Hx of fracture    R arm, foot  . Hyperlipemia   . Hypertension   . IBS (irritable bowel syndrome)   . Low back pain 11/22/2012  . Meningioma 12/20/2001   Repeat MR from 2017 - radiology feels not a meningioma, but rather "Thickening of the anterior falx appears to be related to degenerative ossification and not meningioma." HCPOA opted not to do repeat imaging with CT for the possible hemangioma and declined further evaluation of this.  . Meningioma ([place])    ant falx, stable on prior imaging  . Moderate intellectual disabilities   . Murmur   . Obesity   . Osteoarthrosis, unspecified whether generalized or localized, ankle and foot   . Sarcoidosis    difuse bony lesions and LDA, dx by biopsy in 2016  . Transaminitis   . Type II or unspecified type diabetes mellitus without mention of complication, not stated as uncontrolled     Past Surgical History:  Procedure Laterality Date  . CATARACT  EXTRACTION Bilateral    Dr Katy Fitch  . COLONOSCOPY  04/15/2011  . ELBOW SURGERY     right elbow  . LIPOMA EXCISION Left [id number]   posterior left axilla  . TOTAL ABDOMINAL HYSTERECTOMY  03/05/1992   leiomyoma and cellular leiomyoma    Family History  Problem Relation Age of Onset  . Diabetes Mother   . Stroke Mother   . Hypertension Mother   . Stroke Father   . Diabetes Maternal Aunt   . Heart disease Maternal Aunt         (had pacemaker)  . Diabetes Maternal Grandmother   . Stroke Maternal Grandmother   . Hypertension Maternal Grandmother   . Colon cancer Neg Hx     SOCIAL HX: See HPI   Current Outpatient Medications:  .  BUTENAFINE HCL EX, Apply topically as needed., Disp: , Rfl:  .  diclofenac sodium (VOLTAREN) 1 % GEL, Apply 2 g topically 4 (four) times daily., Disp: 100 g, Rfl: 1 .  diltiazem (CARDIZEM CD) 240 MG 24 hr capsule, TAKE ONE CAPSULE BY MOUTH EVERY DAY, Disp: 90 capsule, Rfl: 2 .  ferrous sulfate 325 (65 FE) MG tablet, Take 1 tablet (325 mg total) daily with breakfast by mouth., Disp: 90 tablet, Rfl: 3 .  fluticasone (FLONASE) 50 MCG/ACT nasal spray, Place into both nostrils daily., Disp: , Rfl:  .  hydrochlorothiazide (MICROZIDE) 12.5 MG capsule, TAKE 1 CAPSULE BY MOUTH ONCE DAILY, Disp: 90 capsule, Rfl: 2 .  hydrocortisone 2.5 % ointment, APPLY TO AFFECTED AREA TWICE A DAY, Disp: , Rfl: 0 .  ipratropium (ATROVENT) 0.06 % nasal spray, PLACE 2 SPRAYS INTO BOTH NOSTRILS 4 (FOUR) TIMES DAILY., Disp: 45 mL, Rfl: 2 .  KLOR-CON M20 20 MEQ tablet, TAKE 1 TABLET (20 MEQ TOTAL) BY MOUTH DAILY. (Patient not taking: Reported on 12/22/2017), Disp: 90 tablet, Rfl: 1 .  latanoprost (XALATAN) 0.005 % ophthalmic solution, Place 1 drop into both eyes at bedtime. , Disp: , Rfl:  .  Loperamide-Simethicone 2-125 MG TABS, Take by mouth as needed., Disp: , Rfl:  .  loratadine (CLARITIN) 10 MG tablet, Take 10 mg by mouth daily as needed for allergies., Disp: , Rfl:  .  metFORMIN  (GLUCOPHAGE) 1000 MG tablet, Take 1 tablet (1,000 mg total) by mouth daily with breakfast., Disp: 90 tablet, Rfl: 1 .  omeprazole (PRILOSEC) 20 MG capsule, TAKE 1 CAPSULE (20 MG TOTAL) BY MOUTH 2 (TWO) TIMES DAILY., Disp: 180 capsule, Rfl: 1 .  pravastatin (PRAVACHOL) 40 MG tablet, TAKE 1 TABLET BY MOUTH EVERY EVENING, Disp: 90 tablet, Rfl: 1 .  Vitamins-Lipotropics ([employer] INNER EAR PLUS PO), Take by mouth. Takes 2 in the am & 2 in the pm, Disp: , Rfl:   EXAM:  Vitals:   12/22/17 0939 12/22/17 1009  BP: 132/78 132/78  Pulse: 94   Temp: (!) 97.4 F (36.3 C)     Body mass index is 33.47 kg/m.  GENERAL: vitals reviewed and listed above, alert, oriented, appears well hydrated and in no acute distress  HEENT: atraumatic, conjunttiva clear, no obvious abnormalities on inspection of external nose and ears  NECK: no obvious masses on inspection  LUNGS: clear to auscultation bilaterally, no wheezes, rales or rhonchi, good air movement  CV: HRRR, no peripheral edema  MS: moves all extremities without noticeable abnormality  PSYCH: pleasant and cooperative, no obvious depression or anxiety  ASSESSMENT AND PLAN:  Discussed the following assessment and plan:  Essential hypertension  Hypokalemia - Plan: Basic metabolic panel  -Check a basic metabolic panel to determine whether she needs potassium or not -We will need to monitor blood pressure and adjust antihypertensives pressure remains elevated -Recommended a healthy low sugar diet and regular exercise along with her medications -Patient advised to return or notify a doctor immediately if symptoms worsen or persist or new concerns arise.  Patient Instructions  BEFORE YOU LEAVE: -lab -follow up: 2 months, cancel if other follow up scheduled  We will check the potassium today. Then can decide if need to restart the potassium or not. Take blood pressure medications daily.   We recommend the following healthy lifestyle for  LIFE: 1) Small portions. But, make sure to get regular (at least 3 per day), healthy meals and small healthy snacks if needed.  2) Eat a healthy clean diet.   TRY TO EAT: -at least 5-7 servings of low sugar, colorful, and nutrient rich vegetables per day (not corn, potatoes or bananas.) -berries are the best choice if you wish to eat fruit (only eat small amounts if trying to reduce weight)  -lean meets (fish, white meat of chicken or [place]) -vegan proteins for some meals - beans or tofu, whole grains, nuts and seeds -Replace bad fats with good fats - good fats include: fish, nuts and seeds, canola oil, olive oil -small amounts of low fat or non fat dairy -  small amounts of100 % whole grains - check the lables -drink plenty of water  AVOID: -SUGAR, sweets, anything with added sugar, corn syrup or sweeteners - must read labels as even foods advertised as "healthy" often are loaded with sugar -if you must have a sweetener, small amounts of stevia may be best -sweetened beverages and artificially sweetened beverages -simple starches (rice, bread, potatoes, pasta, chips, etc - small amounts of 100% whole grains are ok) -red meat, pork, butter -fried foods, fast food, processed food, excessive dairy, eggs and coconut.  3)Get at least 150 minutes of sweaty aerobic exercise per week.  4)Reduce stress - consider counseling, meditation and relaxation to balance other aspects of your life.     Linda Kern, DO

## 2017-12-22 NOTE — Patient Instructions (Addendum)
Linda Thompson , Thank you for taking time to come for your Medicare Wellness Visit. I appreciate your ongoing commitment to your health goals. Please review the following plan we discussed and let me know if I can assist you in the future.   Glad you had your shingrix!   These are the goals we discussed: Goals    . Exercise 150 min/wk Moderate Activity     Manufacturing engineer of Services Cost  A Matter of Balance Class locations vary. Call [place] on Aging for more information.  [email or web address] [phone] 8-Session program addressing the fear of falling and increasing activity levels of older adults Free to minimal cost  A.C.T. By [employer] [address], [place], [place] [address].  BetaBlues.dk [phone]  Personal training, gym, classes including Silver Sneakers* and ACTion for Aging Adults Fee-based  A.H.O.Y. (Add Health to [place]) Airs on Time [employer] 13, M-F at [place]: [employer],  [place] [facility] [place],  [place], [place] [facility], [address] [facility], [place], [place], [place] [address]  [place] Location: Sharrell Ku. [employer] [place]      [phone]  [phone]  [phone]  [phone]  [phone]  [phone]  [phone]  [phone]  [phone]  [phone]    [phone] A total-body conditioning class for adults 42 and older; designed to increase muscular strength, endurance, range of movement, flexibility, balance, agility and coordination Free  [facility] [place], [place] [address] [place]      [address] [place]      [phone]      Pilate's class for individualsreturning to exercise after an injury, before or after surgery or for individuals with complex musculoskeletal issues; designed to improve strength, balance , flexibility      $15/class  [place] [address] [place], [place] [address] [email or web address] [place] classes for beginners to advanced [place], [place] [address] [email or web address] [place] Chair Exercises Free, ages 69 and older; Ages 47-59 fee based  Marvia Pickles, [facility] [address] [place],  [address] [email or web address] .gov [phone]  A.H.O.Y. Tai Chi Fee-based Donation based or free  [place] Class locations vary.  Call or email Angela Burke or view website for more information. [email or web address] [email or web address] [phone] Ongoing classes at local YMCAs and gyms Fee-based  Silver Sneakers A.C.T. By [place] [employer]: [place] [phone]  [phone] Classes designed for older adults who want to improve their strength, flexibility, balance and endurance.   Silver sneakers is covered by some insurance plans and includes a fitness center membership at participating locations. Find out more by calling  [phone] or visiting [email or web address] Covered by  some insurance plans  [facility] [place] [phone] A.H.O.Y., fitness room, personal training, fitness classes for injury prevention, strength, balance, flexibility, water fitness classes Ages 55+: $42 for 6 months; Ages 34-54: $43 for 6 months  Tai Chi for Everybody [facility] [address] [place], [place] [address] Taichiforeverybody'[email or web address] .Patsi Sears [phone] Tai Chi classes for beginners to advanced; geared for seniors Donation Based      [employer] (Helpling Others Participate in Exercise     Loyal Gambler. Rosana Hoes, PhD, [place] [email or web address] [place]     [phone]     A comprehensive fitness program for adults.  The program paris senior-level undergraduates Kinesiology students with adults who desire to learn how to exercise safely.  Includes a structural exercise class focusing on functional fitnesss     $100/semester in fall and spring; $75 in summer (no trainers)    *Silver Sneakers is covered by some Personal assistant and includes a  Radio producer at participating locations.  Find out more by calling [phone] or visiting [email or web address]  For additional health and human services resources for senior adults, please contact SeniorLine at [phone] in [place] and [place] at [phone] in all other areas.    . patient     Will discuss Maintenance with PT  Will discuss how to create safe toning exercise; sitter exercise and plan the time of day and what you will use  Work on the number of times you are exercising; and limits; time frames; 15 min or 30 min   You may want to practice steps     . Patient Stated     Keeps walking and moving        This is a list of the screening recommended for you and due dates:  Health Maintenance  Topic Date Due  . Colon Cancer Screening  04/18/2018  . Complete foot exam   04/21/2018  .  Hemoglobin A1C  05/27/2018  . Eye exam for diabetics  06/04/2018  . Mammogram  09/26/2018  . Urine Protein Check  11/27/2018  . Pap Smear  01/17/2019  . Tetanus Vaccine  08/07/2024  . Flu Shot  Completed  . Pneumococcal vaccine  Completed  .  Hepatitis C: One time screening is recommended by Center for Disease Control  (CDC) for  adults born from [birth year] through 1966.   Completed  . HIV Screening  Completed   Summary: Preventive Care for Adults  A healthy lifestyle and preventive care can promote health and wellness. Preventive health guidelines for adults include the following key practices.  . A routine yearly physical is a good way to check with your health care provider about your health and preventive screening. It is a chance to share any concerns and updates on your health and to receive a thorough exam.  . Visit your dentist for a routine exam and preventive care every 6 months. Brush your teeth twice a day and floss once a day. Good oral hygiene prevents tooth decay and gum disease.  . The frequency of eye exams is based on your age, health, family medical history, use  of contact lenses, and other factors. Follow your health care provider's ecommendations for frequency of eye exams.  . Eat a healthy diet. Foods like vegetables, fruits, whole grains, low-fat dairy products, and lean protein foods contain the nutrients you need without too many calories. Decrease your intake of foods high in solid fats, added sugars, and salt. Eat the  right amount of calories for you. Get information about a proper diet from your health care provider, if necessary.  . Regular physical exercise is one of the most important things you can do for your health. Most adults should get at least 150 minutes of moderate-intensity exercise (any activity that increases your heart rate and causes you to sweat) each week. In addition, most adults need muscle-strengthening exercises on 2 or more days a  week.  Silver Sneakers may be a benefit available to you. To determine eligibility, you may visit the website: www.silversneakers.com or contact program at [phone] Mon-Fri between 8AM-8PM.   . Maintain a healthy weight. The body mass index (BMI) is a screening tool to identify possible weight problems. It provides an estimate of body fat based on height and weight. Your health care provider can find your BMI and can help you achieve or maintain a healthy weight.   For adults 20 years and older: ? A BMI below 18.5 is considered underweight. ? A BMI of 18.5 to 24.9 is normal. ? A BMI of 27 to 28 is considered normal by the Institutes of Health  ? A BMI of 30 and above is considered obese.   . Maintain normal blood lipids and cholesterol levels by exercising and minimizing your intake of saturated fat. Eat a balanced diet with plenty of fruit and vegetables. Blood tests for lipids and cholesterol should begin at age 20 and be repeated every 5 years. If your lipid or cholesterol levels are high, you are over 50, or you are at high risk for heart disease, you may need your cholesterol levels checked more frequently. Ongoing high lipid and cholesterol levels should be treated with medicines if diet and exercise are not working.  . If you smoke, find out from your health care provider how to quit. If you do not use tobacco, please do not start.  . If you choose to drink alcohol, please do not consume more than one drink for women and 2 for men.  One drink is considered to be 12 ounces (355 mL) of beer, 5 ounces (148 mL) of wine, or 1.5 ounces (44 mL) of liquor. Moderation of alcohol intake to this level decreases your risk of breast cancer and liver damage.   . If you are 85-28 years old, ask your health care provider if you should take aspirin to prevent strokes.  . Use sunscreen. Apply sunscreen liberally and repeatedly throughout the day. You should seek shade when your shadow is  shorter than you. Protect yourself by wearing long sleeves, pants, a wide-brimmed hat, and sunglasses year round, whenever you are outdoors.  . Once a month, do a whole body skin exam, using a mirror to look at the skin on your back. Tell your health care provider of new moles, moles that have irregular borders, moles that are larger than a pencil eraser, or moles that have changed in shape or color.  Last, if you have completed an Advanced Directive; please bring a copy and review with your physician and then we will scan to the medical record      Fall Prevention in the Home Falls can cause injuries. They can happen to people of all ages. There are many things you can do to make your home safe and to help prevent falls. What can I do on the outside of my home?  Regularly fix the edges of walkways and driveways and fix any cracks.  Remove anything that might make  you trip as you walk through a door, such as a raised step or threshold.  Trim any bushes or trees on the path to your home.  Use bright outdoor lighting.  Clear any walking paths of anything that might make someone trip, such as rocks or tools.  Regularly check to see if handrails are loose or broken. Make sure that both sides of any steps have handrails.  Any raised decks and porches should have guardrails on the edges.  Have any leaves, snow, or ice cleared regularly.  Use sand or salt on walking paths during winter.  Clean up any spills in your garage right away. This includes oil or grease spills. What can I do in the bathroom?  Use night lights.  Install grab bars by the toilet and in the tub and shower. Do not use towel bars as grab bars.  Use non-skid mats or decals in the tub or shower.  If you need to sit down in the shower, use a plastic, non-slip stool.  Keep the floor dry. Clean up any water that spills on the floor as soon as it happens.  Remove soap buildup in the tub or shower  regularly.  Attach bath mats securely with double-sided non-slip rug tape.  Do not have throw rugs and other things on the floor that can make you trip. What can I do in the bedroom?  Use night lights.  Make sure that you have a light by your bed that is easy to reach.  Do not use any sheets or blankets that are too big for your bed. They should not hang down onto the floor.  Have a firm chair that has side arms. You can use this for support while you get dressed.  Do not have throw rugs and other things on the floor that can make you trip. What can I do in the kitchen?  Clean up any spills right away.  Avoid walking on wet floors.  Keep items that you use a lot in easy-to-reach places.  If you need to reach something above you, use a strong step stool that has a grab bar.  Keep electrical cords out of the way.  Do not use floor polish or wax that makes floors slippery. If you must use wax, use non-skid floor wax.  Do not have throw rugs and other things on the floor that can make you trip. What can I do with my stairs?  Do not leave any items on the stairs.  Make sure that there are handrails on both sides of the stairs and use them. Fix handrails that are broken or loose. Make sure that handrails are as long as the stairways.  Check any carpeting to make sure that it is firmly attached to the stairs. Fix any carpet that is loose or worn.  Avoid having throw rugs at the top or bottom of the stairs. If you do have throw rugs, attach them to the floor with carpet tape.  Make sure that you have a light switch at the top of the stairs and the bottom of the stairs. If you do not have them, ask someone to add them for you. What else can I do to help prevent falls?  Wear shoes that: ? Do not have high heels. ? Have rubber bottoms. ? Are comfortable and fit you well. ? Are closed at the toe. Do not wear sandals.  If you use a stepladder: ? Make sure that it is fully  opened. Do not climb a closed stepladder. ? Make sure that both sides of the stepladder are locked into place. ? Ask someone to hold it for you, if possible.  Clearly mark and make sure that you can see: ? Any grab bars or handrails. ? First and last steps. ? Where the edge of each step is.  Use tools that help you move around (mobility aids) if they are needed. These include: ? Canes. ? Walkers. ? Scooters. ? Crutches.  Turn on the lights when you go into a dark area. Replace any light bulbs as soon as they burn out.  Set up your furniture so you have a clear path. Avoid moving your furniture around.  If any of your floors are uneven, fix them.  If there are any pets around you, be aware of where they are.  Review your medicines with your doctor. Some medicines can make you feel dizzy. This can increase your chance of falling. Ask your doctor what other things that you can do to help prevent falls. This information is not intended to replace advice given to you by your health care provider. Make sure you discuss any questions you have with your health care provider. Document Released: 11/02/2008 Document Revised: 06/14/2015 Document Reviewed: 02/10/2014 Elsevier Interactive Patient Education  [employer].

## 2017-12-22 NOTE — Progress Notes (Signed)
Hannah R Kim, DO  

## 2017-12-22 NOTE — Patient Instructions (Signed)
BEFORE YOU LEAVE: -lab -follow up: 2 months, cancel if other follow up scheduled  We will check the potassium today. Then can decide if need to restart the potassium or not. Take blood pressure medications daily.   We recommend the following healthy lifestyle for LIFE: 1) Small portions. But, make sure to get regular (at least 3 per day), healthy meals and small healthy snacks if needed.  2) Eat a healthy clean diet.   TRY TO EAT: -at least 5-7 servings of low sugar, colorful, and nutrient rich vegetables per day (not corn, potatoes or bananas.) -berries are the best choice if you wish to eat fruit (only eat small amounts if trying to reduce weight)  -lean meets (fish, white meat of chicken or [place]) -vegan proteins for some meals - beans or tofu, whole grains, nuts and seeds -Replace bad fats with good fats - good fats include: fish, nuts and seeds, canola oil, olive oil -small amounts of low fat or non fat dairy -small amounts of100 % whole grains - check the lables -drink plenty of water  AVOID: -SUGAR, sweets, anything with added sugar, corn syrup or sweeteners - must read labels as even foods advertised as "healthy" often are loaded with sugar -if you must have a sweetener, small amounts of stevia may be best -sweetened beverages and artificially sweetened beverages -simple starches (rice, bread, potatoes, pasta, chips, etc - small amounts of 100% whole grains are ok) -red meat, pork, butter -fried foods, fast food, processed food, excessive dairy, eggs and coconut.  3)Get at least 150 minutes of sweaty aerobic exercise per week.  4)Reduce stress - consider counseling, meditation and relaxation to balance other aspects of your life.

## 2017-12-24 ENCOUNTER — Other Ambulatory Visit: Payer: Self-pay

## 2017-12-24 ENCOUNTER — Telehealth: Payer: Self-pay | Admitting: *Deleted

## 2017-12-24 ENCOUNTER — Ambulatory Visit (INDEPENDENT_AMBULATORY_CARE_PROVIDER_SITE_OTHER): Payer: Medicare Other | Admitting: Certified Nurse Midwife

## 2017-12-24 ENCOUNTER — Ambulatory Visit: Payer: Self-pay | Admitting: *Deleted

## 2017-12-24 ENCOUNTER — Encounter: Payer: Self-pay | Admitting: Certified Nurse Midwife

## 2017-12-24 VITALS — BP 118/70 | HR 70 | Resp 16 | Wt 184.0 lb

## 2017-12-24 DIAGNOSIS — Z862 Personal history of diseases of the blood and blood-forming organs and certain disorders involving the immune mechanism: Secondary | ICD-10-CM

## 2017-12-24 DIAGNOSIS — N898 Other specified noninflammatory disorders of vagina: Secondary | ICD-10-CM | POA: Diagnosis not present

## 2017-12-24 DIAGNOSIS — N952 Postmenopausal atrophic vaginitis: Secondary | ICD-10-CM | POA: Diagnosis not present

## 2017-12-24 NOTE — Patient Instructions (Signed)
Use Clotrimazole cream 2 times daily inside vaginal opening. Coconut oil to outside area only.

## 2017-12-24 NOTE — Telephone Encounter (Signed)
Summary: lab result questions    Pt called and received lab results. Pt has some questions. Please advise pt states that she has an appointment at 315 at another office and will not be available.      Patient wants to verify that she does not have to take the potassium- told patient she is correct.   She is taking vitamin D over the counter and she wants to know that is OK- told patient if Dr Maudie Mercury told her that was fine then she can continue- told her she could also talk to her GYN about her bone health and the need for vitamin D.

## 2017-12-24 NOTE — Telephone Encounter (Signed)
Patient called at 0803 stating she had vaginal itching. Returned call to patient. Patient states that she has been using the coconut oil as Debbi had told her to do. Patient states she has been having external vaginal itching for 2-3 days. Has seen a dermatologist who told patient to use hydrocortisone cream externally at night and anti-fungal powder in the morning. Patient states those medications have helped some, but still experiencing itching. Patient states she has been wearing a gown around her apartment like Debbi told her and only wearing the depends when she goes out. Patient states she doesn't want the itching to get worse. OV recommended and patient agreeable. Patient scheduled for Thursday 12-24-17 at 1515. Patient agreeable to date and time of appointment.   Routing to provider and will close encounter.

## 2017-12-24 NOTE — Progress Notes (Signed)
62 y.o. Single Caucasian female [name] here with complaint of vaginal symptoms of itching, and sore area inside vaginal opening. No vaginal bleeding. Slight amount of white discharge only, no odor. Onset of symptoms 3 days ago. Denies new personal products. Continues to use coconut oil to area for dryness. Has been working on diabetes control and has lost 16 pounds now. Feels so much better, working with Dr. Maudie Mercury on control. Sees Dr. Delman Cheadle dermatology as needed. No other health issues today.  Review of Systems  Constitutional: Negative.   HENT: Negative.   Eyes: Negative.   Respiratory: Negative.   Cardiovascular: Negative.   Gastrointestinal: Negative.   Genitourinary: Negative.        Itching inside vagina  Musculoskeletal: Negative.   Skin: Negative.   Neurological: Negative.  Negative for speech change.  Endo/Heme/Allergies: Negative.   Psychiatric/Behavioral: Negative.  Negative for memory loss.    O:Healthy female WDWN Affect: normal, orientation x 3  Exam:Skin:warm and dry Abdomen:soft, non tender, groin creases no redness or skin breakdown noted  Inguinal Lymph nodes: no enlargement or tenderness Pelvic exam: External genital: normal female atrophic appearance with right vulva larger than left with sarcoid mass(known), no apparent change in size or feel, non tender BUS: negative Vagina: scant white discharge noted, with redness on right side only, no lesions Affirm taken Cervix, Uterus,ovaries surgically absent, no masses or fullness noted   A:Normal pelvic exam Menopausal S/P TAH with BSO Atrophic vaginitis with coconut oil use with good results R/O vaginitis Mentally challenged living independently doing well   P:Discussed findings of normal exam and possible yeast  Etiology. Continue coconut oil use for dryness. If she has her Clotrimazole cream at home she can start using twice daily until lab back. Questions addressed at length. Continue to wear a dress at home to  decrease skin break down in genital area. Written instructions given to patient. Voiced understanding. Lab: Affirm  Rv prn

## 2017-12-24 NOTE — Telephone Encounter (Signed)
  Reason for Disposition . Caller has medication question only, adult not sick, and triager answers question    Patient had question about medication- verified information given by nurse at office- patient understands.  Protocols used: MEDICATION QUESTION CALL-A-AH

## 2017-12-26 LAB — VAGINITIS/VAGINOSIS, DNA PROBE
CANDIDA SPECIES: NEGATIVE
GARDNERELLA VAGINALIS: NEGATIVE
Trichomonas vaginosis: NEGATIVE

## 2017-12-29 ENCOUNTER — Telehealth: Payer: Self-pay | Admitting: Certified Nurse Midwife

## 2017-12-29 NOTE — Telephone Encounter (Signed)
Spoke with patient. Advised of results as seen below per Melvia Heaps, CNM.   Patient reports she has been applying clotrimazole as directed bid. Patient reports internal itching started today "around edge where pee comes out and enough to notice". Denies any vaginal d/c, bleeding or odor.   Advised I will review with Melvia Heaps, CNM to determine if any changes to medication need to be made. Patient agreeable.   Melvia Heaps, CNM -please advise.

## 2017-12-29 NOTE — Telephone Encounter (Signed)
Patient returned call, advised as seen below per Melvia Heaps, CNM. Patient repeated back instructions, verbalizes understanding. Encounter closed.

## 2017-12-29 NOTE — Telephone Encounter (Signed)
Patient returning call.

## 2017-12-29 NOTE — Telephone Encounter (Signed)
Since  Her screen was negative she can use coconut oil there, feel it is dryness from appearance at exam

## 2017-12-29 NOTE — Telephone Encounter (Signed)
Patient stated that the ointment she was given at her last visit, is causing her to itch.

## 2017-12-29 NOTE — Telephone Encounter (Signed)
-----   Message from Linda Thompson, CNM sent at 12/27/2017  9:19 PM EST ----- Notify patient her vaginal screen was negative for yeast, BV and trichomonas. Continue using Clotrimazole as discussed to area of concern only as we discussed

## 2017-12-29 NOTE — Telephone Encounter (Signed)
Left message to call Charnae Lill, RN at [facility] [phone].   

## 2018-01-05 ENCOUNTER — Telehealth: Payer: Self-pay | Admitting: Certified Nurse Midwife

## 2018-01-05 NOTE — Telephone Encounter (Signed)
Patient states that she has been using coconut oil but continues to have vaginal itching.  Requests to have skin check. Concerned that itching continues.  Would like to have skin check prior to holidays.  States she is anxious about ongoing itching.  Wonders if anxiety is related to itching.  Office visit tomorrow (tomorrow per patient request due to appointments/transportation for 1030.  Encounter closed.

## 2018-01-05 NOTE — Telephone Encounter (Signed)
Patient left voicemail over lunch requesting a call back from a nurse. Patient did not leave any further details.

## 2018-01-06 ENCOUNTER — Other Ambulatory Visit: Payer: Self-pay

## 2018-01-06 ENCOUNTER — Encounter: Payer: Self-pay | Admitting: Certified Nurse Midwife

## 2018-01-06 ENCOUNTER — Ambulatory Visit (INDEPENDENT_AMBULATORY_CARE_PROVIDER_SITE_OTHER): Payer: Medicare Other | Admitting: Certified Nurse Midwife

## 2018-01-06 VITALS — BP 120/70 | HR 68 | Resp 16 | Wt 182.0 lb

## 2018-01-06 DIAGNOSIS — B3731 Acute candidiasis of vulva and vagina: Secondary | ICD-10-CM

## 2018-01-06 DIAGNOSIS — B373 Candidiasis of vulva and vagina: Secondary | ICD-10-CM

## 2018-01-06 MED ORDER — FLUCONAZOLE 150 MG PO TABS: ORAL_TABLET | ORAL | 0 refills | Status: DC

## 2018-01-06 NOTE — Progress Notes (Signed)
62 y.o. Single Caucasian female [name] here with complaint of vaginal symptoms of itching at times. Using coconut oil externally daily for dryness and irritation.  Denies any vaginal burning or odor. Cleaning with Dove soap, no changes with personal products. Cleaning after bowel movement with baby wipes with no issues. Occasional urinary leakage, but no frequency or pain with urination. Changing underwear twice daily. Continues to see [name] PCP as recommended. Had follow up colon check, all normal and repeat appointment in one year. Eating much better and glucose levels better. Has lost 22 pounds now. Hopefully will be spending Christmas with neighbor. No other concerns.  Review of Systems  Constitutional: Positive for weight loss.       Intentional 22 pounds  HENT: Negative.   Eyes: Negative.   Respiratory: Negative.   Cardiovascular: Negative.   Gastrointestinal: Negative for constipation.       No issues  Genitourinary: Negative.   Musculoskeletal: Negative.   Skin: Positive for itching.       Internal vaginal itching  Neurological: Negative.   Endo/Heme/Allergies: Negative.   Psychiatric/Behavioral: Negative.     O:Healthy female WDWN Affect: normal, orientation x 3 with mental challenged   Exam:Skin: warm and dry, no excoriations noted Abdomen:soft, non tender  Inguinal Lymph nodes: no enlargement or tenderness Pelvic exam: External genital: normal female, no redness or chafing noted in groin area or unde abdominal flap. Vulva on right sight of sarcoidosis no change BUS: negative Vagina: white thick discharge noted. Ph:3.5   ,Wet prep taken,  Cervix, Uterus, adnexa all surgically absent. Adnexa:, no masses or fullness noted Rectal area:  No excoriation or redness or scaling  Wet Prep results: KOH + yeast  Saline + yeast   A:Normal pelvic exam Yeast vaginitis Vaginal and vulva dryness using coconut oil with good response History of Sarcoidosis site right vulva no  change   P:Discussed findings of yeast vaginitis and etiology.  Avoid moist clothes or pads for extended period of time. Wear house dresses to prevent increase perspiration.Patient is aware and does this daily. Discussed importance of cleaning well with wash cloth at bath time. Questions addressed. Continue Coconut oil as indicated for dryness. Rx Diflucan 150 mg one tablet today, repeat in 5 days and if still itching repeat another tablet in 5 days. Written instructions and verbal instructions given, with appropriate feedback of instructions.  Rv prn   Rv prn

## 2018-01-06 NOTE — Patient Instructions (Signed)
Take one tablet today and repeat one tablet on Monday and repeat one tablet in 5 days if still itching

## 2018-01-16 ENCOUNTER — Other Ambulatory Visit: Payer: Self-pay | Admitting: Family Medicine

## 2018-01-27 ENCOUNTER — Ambulatory Visit (INDEPENDENT_AMBULATORY_CARE_PROVIDER_SITE_OTHER): Payer: Medicare Other | Admitting: Podiatry

## 2018-01-27 ENCOUNTER — Other Ambulatory Visit: Payer: Self-pay | Admitting: Gastroenterology

## 2018-01-27 ENCOUNTER — Encounter: Payer: Self-pay | Admitting: Podiatry

## 2018-01-27 DIAGNOSIS — E1140 Type 2 diabetes mellitus with diabetic neuropathy, unspecified: Secondary | ICD-10-CM

## 2018-01-27 DIAGNOSIS — B351 Tinea unguium: Secondary | ICD-10-CM | POA: Diagnosis not present

## 2018-01-27 DIAGNOSIS — M79676 Pain in unspecified toe(s): Secondary | ICD-10-CM | POA: Diagnosis not present

## 2018-01-27 NOTE — Telephone Encounter (Signed)
Would you like to refill? You last saw the pt 05-2017.  Last CBC 11-2017.  Thank you

## 2018-01-27 NOTE — Progress Notes (Signed)
Patient ID: Linda Thompson, female   DOB: [birth year], 63 y.o.   MRN: [id number] Complaint:  Visit Type: Patient returns to my office for continued preventative foot care services. Complaint: Patient states" my nails have grown long and thick and become painful to walk and wear shoes" Patient has been diagnosed with DM with no foot complications. The patient presents for preventative foot care services. No changes to ROS.    Podiatric Exam: Vascular: dorsalis pedis and posterior tibial pulses are palpable bilateral. Capillary return is immediate. Temperature gradient is WNL. Skin turgor WNL  Sensorium: Normal Semmes Weinstein monofilament test. Normal tactile sensation bilaterally. Nail Exam: Pt has thick disfigured discolored nails with subungual debris noted bilateral entire nail hallux through fifth toenails Ulcer Exam: There is no evidence of ulcer or pre-ulcerative changes or infection. Orthopedic Exam: Muscle tone and strength are WNL. No limitations in general ROM. No crepitus or effusions noted. Foot type and digits show no abnormalities. Bony prominences are unremarkable.  Skin: No Porokeratosis. No infection or ulcers  Diagnosis:  Onychomycosis, , Pain in right toe, pain in left toes  Treatment & Plan Procedures and Treatment: Consent by patient was obtained for treatment procedures. The patient understood the discussion of treatment and procedures well. All questions were answered thoroughly reviewed. Debridement of mycotic and hypertrophic toenails, 1 through 5 bilateral and clearing of subungual debris. No ulceration, no infection noted. . ABN signed for 2020. Return Visit-Office Procedure: Patient instructed to return to the office for a follow up visit 3 months for continued evaluation and treatment.   Gardiner Barefoot DPM

## 2018-01-28 ENCOUNTER — Telehealth: Payer: Self-pay | Admitting: Gastroenterology

## 2018-01-28 NOTE — Telephone Encounter (Signed)
Called and left message for pt that Amy is recommending her next visit be with Dr. Havery Moros. Also, she may not need to continue with ferrous sulfate so we are not going to refill it at this point and will wait for her appt with Dr. Havery Moros.  Scheduled her for Monday, 1-13 at 4:00pm. Asked her to call back and let us know if that works for her.

## 2018-01-28 NOTE — Telephone Encounter (Signed)
Pt clled in for a refill pt  is sched for 02/02/2018 @10 :30am with Amy Esterwood

## 2018-01-28 NOTE — Telephone Encounter (Signed)
No - do not refill , not sure Linda Thompson needs to be on iron at this point - I do see that Linda Thompson will be due for Colonoscopy soon - Linda Thompson should come in for office visit with Dr Havery Moros

## 2018-01-28 NOTE — Telephone Encounter (Signed)
na

## 2018-01-29 NOTE — Telephone Encounter (Signed)
Called and spoke with pt. She has reestablished care with [facility], Dr. Rolin Barry (EGD 10-2017).  I told her I would cancel her appt to see Dr. Havery Moros on Monday and she needs to call Dr. Tressia Danas office with all her GI needs.  I gave her the number for their office and she is going to call.

## 2018-02-01 ENCOUNTER — Ambulatory Visit: Payer: Medicare Other | Admitting: Gastroenterology

## 2018-02-02 ENCOUNTER — Ambulatory Visit: Payer: Medicare Other | Admitting: Physician Assistant

## 2018-02-07 ENCOUNTER — Other Ambulatory Visit: Payer: Self-pay | Admitting: Podiatry

## 2018-02-17 DIAGNOSIS — R449 Unspecified symptoms and signs involving general sensations and perceptions: Secondary | ICD-10-CM | POA: Diagnosis not present

## 2018-02-17 DIAGNOSIS — R011 Cardiac murmur, unspecified: Secondary | ICD-10-CM | POA: Insufficient documentation

## 2018-02-21 NOTE — Progress Notes (Signed)
HPI:  Using dictation device. Unfortunately this device frequently misinterprets words/phrases.  Linda Thompson is a pleasant 63 y.o. here for follow up. Chronic medical problems summarized below were reviewed for changes and stability and were updated as needed below. These issues and their treatment remain stable for the most part.  Reports doing well without any complaints. Asks about her vit D. Not taking potassium as we clarified in the past prior to last labs - she wonders if she should. Denies HA, malaise, bleeding, abd pain, CP, SOB, DOE, treatment intolerance or new symptoms.  AWV 12/2017 Diabetes: -with htn, hld, obesity -meds: metformin 1000mg  bid  -allergy to acei  Obesity, hyperlipidemia, hypertension: -meds: diltiazem, hctz, pravastatin  Moderate intellectual disabilities/Anxiety: -seeing psychiatrist -high level of anxiety about health with frequent visits  -HCPOA: Sue Flippin  Allergic rhinitis: -uses flonase, atroventand allegraas needed  GERD/irritable bowel syndrome/Gastric polyps: -anemia -seeing GI at [facility] and [place] -meds: low dose PPI, iron  Sarcoidosis: -sees pulmonology, Dr. Melvyn Novas -Bx 10/03/14 L   - ACE level reported to be 26 ? 01/2015  - cxr 04/24/2015 wnl  Osteoarthritis,multiple sites: -sees Dr. Lorin Mercy -uses tylenol prn  Meningioma: -Repeat MR from 2017 - radiology feels not a meningioma, but rather "Thickening of the anterior falx appears to be related to degenerative ossification and not meningioma." -advised CT and eval with specialist, HCPOA opted after length discuss and review of report not to do repeat imaging with CT for the possible hemangioma and understood potential for other etiologies, but declined further evaluation of this. Declined again on future discussion as well.  ROS: See pertinent positives and negatives per HPI.  Past Medical History:  Diagnosis Date  . Allergic rhinitis   . Allergy   . Anemia   .  Chronic low back pain   . DDD (degenerative disc disease), lumbar   . GERD (gastroesophageal reflux disease)    w/ hx diaphragmatic hernia  . Glaucoma   . GLAUCOMA NOS 03/11/2006   Qualifier: Diagnosis of  By: Diona Browner MD, Amy    . Heart murmur   . Hx of fracture    R arm, foot  . Hyperlipemia   . Hypertension   . IBS (irritable bowel syndrome)   . Low back pain 11/22/2012  . Meningioma 12/20/2001   Repeat MR from 2017 - radiology feels not a meningioma, but rather "Thickening of the anterior falx appears to be related to degenerative ossification and not meningioma." HCPOA opted not to do repeat imaging with CT for the possible hemangioma and declined further evaluation of this.  . Meningioma ([place])    ant falx, stable on prior imaging  . Moderate intellectual disabilities   . Murmur   . Obesity   . Osteoarthrosis, unspecified whether generalized or localized, ankle and foot   . Sarcoidosis    difuse bony lesions and LDA, dx by biopsy in 2016  . Transaminitis   . Type II or unspecified type diabetes mellitus without mention of complication, not stated as uncontrolled     Past Surgical History:  Procedure Laterality Date  . CATARACT EXTRACTION Bilateral    Dr Katy Fitch  . COLONOSCOPY  04/15/2011  . ELBOW SURGERY     right elbow  . LIPOMA EXCISION Left [id number]   posterior left axilla  . TOTAL ABDOMINAL HYSTERECTOMY  03/05/1992   leiomyoma and cellular leiomyoma    Family History  Problem Relation Age of Onset  . Diabetes Mother   . Stroke Mother   .  Hypertension Mother   . Stroke Father   . Diabetes Maternal Aunt   . Heart disease Maternal Aunt         (had pacemaker)  . Diabetes Maternal Grandmother   . Stroke Maternal Grandmother   . Hypertension Maternal Grandmother   . Colon cancer Neg Hx     SOCIAL HX: see hpi   Current Outpatient Medications:  .  BUTENAFINE HCL EX, Apply topically as needed., Disp: , Rfl:  .  diclofenac sodium (VOLTAREN) 1 % GEL, APPLY 2 GRAMS  TO AFFECTED AREA 4 TIMES A DAY, Disp: 100 g, Rfl: 1 .  diltiazem (CARDIZEM CD) 240 MG 24 hr capsule, TAKE ONE CAPSULE BY MOUTH EVERY DAY, Disp: 90 capsule, Rfl: 2 .  ferrous sulfate 325 (65 FE) MG tablet, Take 1 tablet (325 mg total) daily with breakfast by mouth. (Patient taking differently: Take 325 mg by mouth 3 (three) times daily with meals. ), Disp: 90 tablet, Rfl: 3 .  fluticasone (FLONASE) 50 MCG/ACT nasal spray, Place into both nostrils daily., Disp: , Rfl:  .  hydrochlorothiazide (MICROZIDE) 12.5 MG capsule, TAKE 1 CAPSULE BY MOUTH ONCE DAILY, Disp: 90 capsule, Rfl: 2 .  hydrocortisone 2.5 % ointment, APPLY TO AFFECTED AREA TWICE A DAY, Disp: , Rfl: 0 .  ipratropium (ATROVENT) 0.06 % nasal spray, PLACE 2 SPRAYS INTO BOTH NOSTRILS 4 (FOUR) TIMES DAILY., Disp: 45 mL, Rfl: 2 .  latanoprost (XALATAN) 0.005 % ophthalmic solution, Place 1 drop into both eyes at bedtime. , Disp: , Rfl:  .  Loperamide-Simethicone 2-125 MG TABS, Take by mouth as needed., Disp: , Rfl:  .  loratadine (CLARITIN) 10 MG tablet, Take 10 mg by mouth daily as needed for allergies., Disp: , Rfl:  .  metFORMIN (GLUCOPHAGE) 1000 MG tablet, Take 1 tablet (1,000 mg total) by mouth daily with breakfast., Disp: 90 tablet, Rfl: 1 .  omeprazole (PRILOSEC) 20 MG capsule, TAKE 1 CAPSULE (20 MG TOTAL) BY MOUTH 2 (TWO) TIMES DAILY., Disp: 180 capsule, Rfl: 1 .  pravastatin (PRAVACHOL) 40 MG tablet, TAKE 1 TABLET BY MOUTH EVERY EVENING, Disp: 90 tablet, Rfl: 1 .  VITAMIN D PO, Take 1,000 Int'l Units by mouth., Disp: , Rfl:  .  Vitamins-Lipotropics ([employer] INNER EAR PLUS PO), Take by mouth. Takes 2 in the am & 2 in the pm, Disp: , Rfl:   EXAM:  Vitals:   02/23/18 1000  BP: 122/70  Pulse: 98  Temp: 97.9 F (36.6 C)    Body mass index is 32.26 kg/m.  GENERAL: vitals reviewed and listed above, alert, oriented, appears well hydrated and in no acute distress  HEENT: atraumatic, conjunttiva clear, no obvious abnormalities on  inspection of external nose and ears  NECK: no obvious masses on inspection  LUNGS: clear to auscultation bilaterally, no wheezes, rales or rhonchi, good air movement  CV: HRRR, no peripheral edema  MS: moves all extremities without noticeable abnormality  PSYCH: pleasant and cooperative, no obvious depression or anxiety  ASSESSMENT AND PLAN:  Discussed the following assessment and plan:  Hypertension associated with diabetes ([place])  Type 2 diabetes mellitus without complication, without long-term current use of insulin (HCC)  Morbid obesity ([place])  Controlled type 2 diabetes mellitus with complication, without long-term current use of insulin (HCC)  Sarcoidosis  -went over med list, supplements again with her -lifestyle recs - see below fo rwt reduction and tx metabolic dz -discussed chronic issues and has not had follow up with pulm in sometimes, she  asks about this and we will give her number to call, she plans to discuss with [name] first. No resp symptoms. -labs at next visit  -Patient advised to return or notify a doctor immediately if symptoms worsen or persist or new concerns arise.  Patient Instructions  BEFORE YOU LEAVE: -# for pulmonology, Dr. Melvyn Novas -follow up: 3 months - will plan to do labs then  Talk with [name] about seeing your lung doctor and call their office for regular follow up.  Throw away the potassium if not taking.  800 IU vit D3 daily.  We recommend the following healthy lifestyle for LIFE: 1) Small portions. But, make sure to get regular (at least 3 per day), healthy meals and small healthy snacks if needed.  2) Eat a healthy clean diet.   TRY TO EAT: -at least 5-7 servings of low sugar, colorful, and nutrient rich vegetables per day (not corn, potatoes or bananas.) -berries are the best choice if you wish to eat fruit (only eat small amounts if trying to reduce weight)  -lean meets (fish, white meat of chicken or [place]) -vegan proteins for some  meals - beans or tofu, whole grains, nuts and seeds -Replace bad fats with good fats - good fats include: fish, nuts and seeds, canola oil, olive oil -small amounts of low fat or non fat dairy -small amounts of100 % whole grains - check the lables -drink plenty of water  AVOID: -SUGAR, sweets, anything with added sugar, corn syrup or sweeteners - must read labels as even foods advertised as "healthy" often are loaded with sugar -if you must have a sweetener, small amounts of stevia may be best -sweetened beverages and artificially sweetened beverages -simple starches (rice, bread, potatoes, pasta, chips, etc - small amounts of 100% whole grains are ok) -red meat, pork, butter -fried foods, fast food, processed food, excessive dairy, eggs and coconut.  3)Get at least 150 minutes of sweaty aerobic exercise per week.  4)Reduce stress - consider counseling, meditation and relaxation to balance other aspects of your life.    [name], DO

## 2018-02-23 ENCOUNTER — Ambulatory Visit: Payer: Medicare Other | Admitting: Family Medicine

## 2018-02-23 ENCOUNTER — Ambulatory Visit (INDEPENDENT_AMBULATORY_CARE_PROVIDER_SITE_OTHER): Payer: Medicare Other | Admitting: Family Medicine

## 2018-02-23 ENCOUNTER — Encounter: Payer: Self-pay | Admitting: Family Medicine

## 2018-02-23 VITALS — BP 122/70 | HR 98 | Temp 97.9°F | Ht 62.0 in | Wt 176.4 lb

## 2018-02-23 DIAGNOSIS — I10 Essential (primary) hypertension: Secondary | ICD-10-CM

## 2018-02-23 DIAGNOSIS — E1159 Type 2 diabetes mellitus with other circulatory complications: Secondary | ICD-10-CM | POA: Diagnosis not present

## 2018-02-23 DIAGNOSIS — E119 Type 2 diabetes mellitus without complications: Secondary | ICD-10-CM | POA: Diagnosis not present

## 2018-02-23 DIAGNOSIS — D869 Sarcoidosis, unspecified: Secondary | ICD-10-CM | POA: Diagnosis not present

## 2018-02-23 DIAGNOSIS — E118 Type 2 diabetes mellitus with unspecified complications: Secondary | ICD-10-CM

## 2018-02-23 NOTE — Patient Instructions (Addendum)
BEFORE YOU LEAVE: -# for pulmonology, Dr. Melvyn Novas -follow up: 3 months - will plan to do labs then  Talk with [name] about seeing your lung doctor and call their office for regular follow up.  Throw away the potassium if not taking.  800 IU vit D3 daily.  We recommend the following healthy lifestyle for LIFE: 1) Small portions. But, make sure to get regular (at least 3 per day), healthy meals and small healthy snacks if needed.  2) Eat a healthy clean diet.   TRY TO EAT: -at least 5-7 servings of low sugar, colorful, and nutrient rich vegetables per day (not corn, potatoes or bananas.) -berries are the best choice if you wish to eat fruit (only eat small amounts if trying to reduce weight)  -lean meets (fish, white meat of chicken or [place]) -vegan proteins for some meals - beans or tofu, whole grains, nuts and seeds -Replace bad fats with good fats - good fats include: fish, nuts and seeds, canola oil, olive oil -small amounts of low fat or non fat dairy -small amounts of100 % whole grains - check the lables -drink plenty of water  AVOID: -SUGAR, sweets, anything with added sugar, corn syrup or sweeteners - must read labels as even foods advertised as "healthy" often are loaded with sugar -if you must have a sweetener, small amounts of stevia may be best -sweetened beverages and artificially sweetened beverages -simple starches (rice, bread, potatoes, pasta, chips, etc - small amounts of 100% whole grains are ok) -red meat, pork, butter -fried foods, fast food, processed food, excessive dairy, eggs and coconut.  3)Get at least 150 minutes of sweaty aerobic exercise per week.  4)Reduce stress - consider counseling, meditation and relaxation to balance other aspects of your life.

## 2018-03-01 ENCOUNTER — Telehealth: Payer: Self-pay | Admitting: Certified Nurse Midwife

## 2018-03-01 ENCOUNTER — Telehealth: Payer: Self-pay

## 2018-03-01 NOTE — Telephone Encounter (Signed)
Copied from [place] [id number]. Topic: General - Other >> Mar 01, 2018  1:14 PM Lennox Solders wrote: Reason for CRM: [name] health care power of attorney  is calling and would like  to  know why dr Maudie Mercury requesting the pt to see dr wert pulmonologist. Darcel Smalling is listed in AVS

## 2018-03-01 NOTE — Telephone Encounter (Signed)
Left message to call Zachary Lovins, RN at [facility] [phone].   

## 2018-03-01 NOTE — Telephone Encounter (Signed)
I called Ms [name] and informed her the reason Dr Maudie Mercury advised a follow up with Dr Melvyn Novas is due to sarcoidosis.  She agreed to let the pt know.

## 2018-03-01 NOTE — Telephone Encounter (Signed)
Spoke with patient. Patient requesting OV with Melvia Heaps, CNM on 2/11. Patient reports vaginal itching for 2 days, and a sharp pain in vagina x1 when urinating on 2/9. Vaginal itching not relived by coconut oil. Denies redness, any vaginal d/c, vaginal bleeding, odor, dysuria, frequency, lower back pain, pelvic pain, fever/chills, N/V.   OV scheduled for 2/11 at 9:30am with Melvia Heaps, CNM.   Routing to provider for final review. Patient is agreeable to disposition. Will close encounter.

## 2018-03-01 NOTE — Telephone Encounter (Signed)
Patient is calling again asking for the nurse to call her ASAP.

## 2018-03-01 NOTE — Telephone Encounter (Signed)
Patient says she is using coconut oil but is itching on the inside.

## 2018-03-02 ENCOUNTER — Encounter: Payer: Self-pay | Admitting: Certified Nurse Midwife

## 2018-03-02 ENCOUNTER — Ambulatory Visit (INDEPENDENT_AMBULATORY_CARE_PROVIDER_SITE_OTHER): Payer: Medicare Other | Admitting: Certified Nurse Midwife

## 2018-03-02 ENCOUNTER — Other Ambulatory Visit: Payer: Self-pay

## 2018-03-02 VITALS — BP 120/70 | HR 70 | Resp 16 | Wt 177.0 lb

## 2018-03-02 DIAGNOSIS — F79 Unspecified intellectual disabilities: Secondary | ICD-10-CM | POA: Diagnosis not present

## 2018-03-02 DIAGNOSIS — Z8639 Personal history of other endocrine, nutritional and metabolic disease: Secondary | ICD-10-CM | POA: Diagnosis not present

## 2018-03-02 DIAGNOSIS — N951 Menopausal and female climacteric states: Secondary | ICD-10-CM

## 2018-03-02 DIAGNOSIS — Z862 Personal history of diseases of the blood and blood-forming organs and certain disorders involving the immune mechanism: Secondary | ICD-10-CM | POA: Diagnosis not present

## 2018-03-02 NOTE — Progress Notes (Signed)
63 y.o. Single Caucasian female [name] here with complaint of vulva symptoms of itching, burning, again. She has been applying coconut oil to area as before, with no changes.Marland Kitchen Describes discharge as none. Denies vaginal bleeding or vaginal odor. " Just itching". Onset of symptoms last 24 hours.  Denies new personal products, only using coconut oil and dove soap to clean with.   Urinary symptoms none . Menopausal with vaginal dryness. Sees PCP for diabetes management and Dr. Delman Cheadle for skin management when flares occur. Being referred to Pulmonary per patient for evaluation. Continues to have assistance with cleaning and clean clothes. Drives and shops for self. Has POA for finances. No other issues today.  Review of Systems  Constitutional: Negative.  Negative for chills and fever.  HENT: Negative.   Eyes: Negative.   Respiratory: Negative.   Cardiovascular: Negative.   Gastrointestinal: Negative.  Negative for abdominal pain, constipation, diarrhea, nausea and vomiting.  Genitourinary: Negative for dysuria, flank pain, frequency, hematuria and urgency.       "had leg to jump when urinated, but no burning or pain or odor".  Musculoskeletal: Negative.   Skin: Positive for itching.       Itching in my female area, using coconut oil. Just started in the last 24 hours  Neurological: Negative.   Endo/Heme/Allergies: Negative.   Psychiatric/Behavioral:       Mentally challenged    O:Healthy female WDWN Affect: normal, orientation x 3  Exam:Skin: warm and dry Abdomen: soft, non tender, no excoriation under abdominal flap noted  Inguinal Lymph nodes: no enlargement or tenderness, no groin excoriation noted, skin appears healthy Pelvic exam: External genital: normal female, atrophic appearance, no scaling or excoriation noted.  Bladder, urethra  Non tender, Urethral meatus slight pink and patient acknowledges this "itches". No pustules or lesions noted, normal appearance. Shown area to patient  with mirror and is aware of area to place coconut oil for comfort due to dryness.  Vulva: right mass still present no change in size approximately 5-6 cm, site of sarcoid, non tender Vagina: scant discharge noted. Atrophy noted, non tender, no odor  Cervix and Uterus absent, Adnexa:absent  no masses or fullness noted Rectal area: appears normal, no rectal exam done   A:Normal pelvic exam Atrophic vulvitis/vaginitis chronic issue, coconut oil working well for moisture Sarcoid of Vulva on right , no change Diabetic with PCP management Mentally challenged    P:Discussed findings of no yeast appearance, just dryness.  and etiology.  Avoid moist clothes or pads for extended period of time( she wears depends). Warning signs of UTI given, limit diet drinks, encouraged water intake. Continue with coconut oil as she has the areas shown to her in the mirror for dryness. If symptoms change, needs appointment. Continue follow up with PCP as scheduled.  Rv prn

## 2018-03-02 NOTE — Patient Instructions (Signed)
Use coconut oil 2 times daily for itching inside and outside of vagina Continue Dove Soap for washing.

## 2018-03-15 ENCOUNTER — Telehealth: Payer: Self-pay | Admitting: Certified Nurse Midwife

## 2018-03-15 NOTE — Telephone Encounter (Signed)
Spoke with patient. Patient requesting OV for internal vaginal itching. Denies urinary symptoms, vaginal d/c or odor. Has been using coconut oil PRN and nightly with no changes.   OV scheduled for 2/25 at 10am with Melvia Heaps, CNM.   Routing to provider for final review. Patient is agreeable to disposition. Will close encounter.

## 2018-03-15 NOTE — Telephone Encounter (Signed)
Patient says she is having problems. She is still using coconut oil.

## 2018-03-15 NOTE — Telephone Encounter (Signed)
Message left to return call to Linda Thompson at [phone].    

## 2018-03-15 NOTE — Telephone Encounter (Signed)
Patient returning call.

## 2018-03-16 ENCOUNTER — Emergency Department (HOSPITAL_COMMUNITY)
Admission: EM | Admit: 2018-03-16 | Discharge: 2018-03-16 | Disposition: A | Discharge status: Home / Self Care | Payer: No Typology Code available for payment source | Attending: Emergency Medicine | Admitting: Emergency Medicine

## 2018-03-16 ENCOUNTER — Encounter (HOSPITAL_COMMUNITY): Payer: Self-pay | Admitting: Emergency Medicine

## 2018-03-16 ENCOUNTER — Emergency Department (HOSPITAL_COMMUNITY): Payer: No Typology Code available for payment source

## 2018-03-16 ENCOUNTER — Ambulatory Visit (INDEPENDENT_AMBULATORY_CARE_PROVIDER_SITE_OTHER): Payer: Medicare Other | Admitting: Certified Nurse Midwife

## 2018-03-16 ENCOUNTER — Encounter: Payer: Self-pay | Admitting: Certified Nurse Midwife

## 2018-03-16 ENCOUNTER — Other Ambulatory Visit: Payer: Self-pay

## 2018-03-16 VITALS — BP 118/68 | HR 70 | Resp 16 | Wt 179.0 lb

## 2018-03-16 DIAGNOSIS — Y9389 Activity, other specified: Secondary | ICD-10-CM | POA: Diagnosis not present

## 2018-03-16 DIAGNOSIS — N952 Postmenopausal atrophic vaginitis: Secondary | ICD-10-CM | POA: Diagnosis not present

## 2018-03-16 DIAGNOSIS — R079 Chest pain, unspecified: Secondary | ICD-10-CM | POA: Diagnosis not present

## 2018-03-16 DIAGNOSIS — Z01419 Encounter for gynecological examination (general) (routine) without abnormal findings: Secondary | ICD-10-CM

## 2018-03-16 DIAGNOSIS — I10 Essential (primary) hypertension: Secondary | ICD-10-CM | POA: Diagnosis not present

## 2018-03-16 DIAGNOSIS — E119 Type 2 diabetes mellitus without complications: Secondary | ICD-10-CM | POA: Insufficient documentation

## 2018-03-16 DIAGNOSIS — Y999 Unspecified external cause status: Secondary | ICD-10-CM | POA: Insufficient documentation

## 2018-03-16 DIAGNOSIS — Y92410 Unspecified street and highway as the place of occurrence of the external cause: Secondary | ICD-10-CM | POA: Diagnosis not present

## 2018-03-16 DIAGNOSIS — Z7984 Long term (current) use of oral hypoglycemic drugs: Secondary | ICD-10-CM | POA: Diagnosis not present

## 2018-03-16 DIAGNOSIS — S301XXA Contusion of abdominal wall, initial encounter: Secondary | ICD-10-CM | POA: Diagnosis not present

## 2018-03-16 DIAGNOSIS — S299XXA Unspecified injury of thorax, initial encounter: Secondary | ICD-10-CM | POA: Diagnosis not present

## 2018-03-16 DIAGNOSIS — R000 Tachycardia, unspecified: Secondary | ICD-10-CM | POA: Diagnosis not present

## 2018-03-16 DIAGNOSIS — N898 Other specified noninflammatory disorders of vagina: Secondary | ICD-10-CM

## 2018-03-16 DIAGNOSIS — S2220XA Unspecified fracture of sternum, initial encounter for closed fracture: Secondary | ICD-10-CM | POA: Insufficient documentation

## 2018-03-16 DIAGNOSIS — Z79899 Other long term (current) drug therapy: Secondary | ICD-10-CM | POA: Diagnosis not present

## 2018-03-16 LAB — BASIC METABOLIC PANEL
ANION GAP: 11 (ref 5–15)
BUN: 18 mg/dL (ref 8–23)
CHLORIDE: 107 mmol/L (ref 98–111)
CO2: 24 mmol/L (ref 22–32)
Calcium: 9.2 mg/dL (ref 8.9–10.3)
Creatinine, Ser: 0.74 mg/dL (ref 0.44–1.00)
GFR calc Af Amer: >60 mL/min (ref >60)
GFR calc non Af Amer: >60 mL/min (ref >60)
GLUCOSE: 137 mg/dL — AB (ref 70–99)
POTASSIUM: 3.7 mmol/L (ref 3.5–5.1)
Sodium: 142 mmol/L (ref 135–145)

## 2018-03-16 LAB — CBC
HEMATOCRIT: 53.1 % — AB (ref 36.0–46.0)
HEMOGLOBIN: 16.1 g/dL — AB (ref 12.0–15.0)
MCH: 29.1 pg (ref 26.0–34.0)
MCHC: 30.3 g/dL (ref 30.0–36.0)
MCV: 95.8 fL (ref 80.0–100.0)
Platelets: 402 10*3/uL — ABNORMAL HIGH (ref 150–400)
RBC: 5.54 MIL/uL — AB (ref 3.87–5.11)
RDW: 13.7 % (ref 11.5–15.5)
WBC: 19.6 10*3/uL — AB (ref 4.0–10.5)
nRBC: 0 % (ref 0.0–0.2)

## 2018-03-16 MED ORDER — ACETAMINOPHEN 325 MG PO TABS
650.0000 mg | ORAL_TABLET | Freq: Once | ORAL | Status: AC
  Administered 2018-03-16: 650 mg via ORAL
  Filled 2018-03-16: qty 2

## 2018-03-16 MED ORDER — IOHEXOL 300 MG/ML  SOLN
100.0000 mL | Freq: Once | INTRAMUSCULAR | Status: AC | PRN
  Administered 2018-03-16: 100 mL via INTRAVENOUS

## 2018-03-16 MED ORDER — SODIUM CHLORIDE (PF) 0.9 % IJ SOLN
INTRAMUSCULAR | Status: AC
  Filled 2018-03-16: qty 50

## 2018-03-16 NOTE — ED Notes (Signed)
Patient transported to CT 
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
## 2018-03-16 NOTE — ED Notes (Signed)
Pt remains on the phone at this time.

## 2018-03-16 NOTE — Progress Notes (Signed)
63 y.o. Single Caucasian female [name] here with complaint of vaginal symptoms of itching inside the clitoral hood.Denies vaginal discharge or vaginal odor. Denies vaginal bleeding. Continues with coconut oil for dryness, hydrocortisone cream for itching at times.  Onset of symptoms 5 days ago. Denies new personal products from the drugstore, still using her Clotrimazole if needed. Denies urinary incontinence or stress incontinence. Bathes daily and air dries in the vaginal area, which works. Denies urinary odor or change, or bowel issues. Patient a diabetic and followed by Dr. Maudie Mercury. Has follow up appointment in 05/2018.   Review of Systems  Constitutional: Negative.   HENT: Negative.   Eyes: Negative.   Respiratory: Negative.   Cardiovascular: Negative.   Gastrointestinal: Negative.   Genitourinary: Negative.   Musculoskeletal: Negative.   Skin:       Vaginal itching  Neurological: Negative.   Endo/Heme/Allergies: Negative.   Psychiatric/Behavioral: Negative.     O:Healthy female WDWN Affect: normal, orientation x 3 Mentally challenged, carries all her medications with her. Still driving with no issues.  Exam: Skin: warm and dry Abdomen:Soft, non tender, no masses, abdominal skin no redness or excoriation under abdominal flap or in groin area today.  Inguinal Lymph nodes: no enlargement or tenderness Pelvic exam: External genital: normal female, atrophic appearance, with slight scaling noted at clitoral area BUS: negative Vagina: scant normal appearing white discharge noted.  Affirm taken Cervix:absent Uterus: Absent Adnexa:adnexa absent, no masses or fullness noted      A:Normal pelvic exam Atrophic vaginitis R/O vaginal infection History of sarcoid of right vulva, no size change Mentally challenged Diabetic in good control now   P:Discussed findings of atrophic vaginitis and etiology. Discussed findings and should continue coconut oil for dryness and use OTC  Hydrocortisone cream(she has) thinly on vulva and under clitoral hood area. Avoid moist clothes or pads for extended period of time. Discussed will call with results of lab once in. Questions addressed.  Lab: Affirm  Rv prn

## 2018-03-16 NOTE — ED Notes (Signed)
Attempted to obtain blood. Pt speaking with someone on the phone. Pt did not get off phone when nurse was in the room in order for this nurse to obtain blood. Will attempt again

## 2018-03-16 NOTE — ED Notes (Signed)
ED Provider at bedside. 
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
## 2018-03-16 NOTE — Patient Instructions (Addendum)
Apply the hydrocortisone cream small amount on lip area and at top vagina entrance. Two times a day until test results in.

## 2018-03-16 NOTE — ED Notes (Signed)
ED provider at bedside for 3rd attempt to speak with pt.

## 2018-03-17 ENCOUNTER — Telehealth: Payer: Self-pay | Admitting: Family Medicine

## 2018-03-17 DIAGNOSIS — F419 Anxiety disorder, unspecified: Secondary | ICD-10-CM

## 2018-03-17 DIAGNOSIS — R454 Irritability and anger: Secondary | ICD-10-CM

## 2018-03-17 LAB — VAGINITIS/VAGINOSIS, DNA PROBE
Candida Species: NEGATIVE
GARDNERELLA VAGINALIS: NEGATIVE
TRICHOMONAS VAG: NEGATIVE
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
## 2018-03-17 NOTE — Telephone Encounter (Signed)
I called Ms Linda Thompson to see if there was a message I could forward to Dr Maudie Mercury as she is out of the office today.  Ms Linda Thompson stated she only wants to speak with Dr Maudie Mercury and asked that she call her on Thursday.  Message sent to Dr Maudie Mercury.

## 2018-03-18 NOTE — Telephone Encounter (Signed)
Called. LM to return call. Please take message or have them set up appointment? Appointment may be best if concerns. Thanks.
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
## 2018-03-22 ENCOUNTER — Encounter: Payer: Self-pay | Admitting: Family Medicine

## 2018-03-22 ENCOUNTER — Ambulatory Visit (INDEPENDENT_AMBULATORY_CARE_PROVIDER_SITE_OTHER): Payer: Medicare Other | Admitting: Family Medicine

## 2018-03-22 VITALS — BP 120/78 | HR 80 | Temp 97.7°F | Ht 62.0 in | Wt 178.2 lb

## 2018-03-22 DIAGNOSIS — S2220XA Unspecified fracture of sternum, initial encounter for closed fracture: Secondary | ICD-10-CM | POA: Diagnosis not present

## 2018-03-22 DIAGNOSIS — T182XXA Foreign body in stomach, initial encounter: Secondary | ICD-10-CM

## 2018-03-22 DIAGNOSIS — S20211A Contusion of right front wall of thorax, initial encounter: Secondary | ICD-10-CM

## 2018-03-22 DIAGNOSIS — R911 Solitary pulmonary nodule: Secondary | ICD-10-CM | POA: Diagnosis not present

## 2018-03-22 DIAGNOSIS — D869 Sarcoidosis, unspecified: Secondary | ICD-10-CM | POA: Diagnosis not present

## 2018-03-22 DIAGNOSIS — S301XXA Contusion of abdominal wall, initial encounter: Secondary | ICD-10-CM | POA: Diagnosis not present

## 2018-03-22 NOTE — Patient Instructions (Addendum)
BEFORE YOU LEAVE: -follow up: 3 months  I would recommend a referral to orthopedics about the fracture, a referral back to your pulmonologist about the pulmonary nodules and a visit with your gastroenterologist about the foreign bodies in the stomach.  Take it easy but get regular gentle activity.   I hope you are feeling better soon! Seek care promptly if your symptoms worsen, new concerns arise or you are not improving with treatment.

## 2018-04-02 ENCOUNTER — Other Ambulatory Visit: Payer: Self-pay

## 2018-04-02 ENCOUNTER — Telehealth: Payer: Self-pay | Admitting: Certified Nurse Midwife

## 2018-04-02 ENCOUNTER — Encounter: Payer: Self-pay | Admitting: Certified Nurse Midwife

## 2018-04-02 ENCOUNTER — Ambulatory Visit (INDEPENDENT_AMBULATORY_CARE_PROVIDER_SITE_OTHER): Payer: Medicare Other | Admitting: Certified Nurse Midwife

## 2018-04-02 VITALS — BP 114/68 | HR 68 | Resp 16 | Wt 174.0 lb

## 2018-04-02 DIAGNOSIS — N898 Other specified noninflammatory disorders of vagina: Secondary | ICD-10-CM

## 2018-04-02 NOTE — Telephone Encounter (Signed)
Spoke with patient. Reports pain with urination that started on 3/12. Patient unsure if the pain is from urine touching skin or when voiding. Denies any other GYN symptoms, fever/chills, N/V, blood in urine. Has been applying coconut oil and "another cream that starts with H". Provides no relief. Patient is concerned that weekend is coming up. Patient in [place] 2/25, states she is able to drive. Requesting OV.   OV scheduled for today at 1545 with Melvia Heaps, CNM.  Encounter closed.

## 2018-04-02 NOTE — Patient Instructions (Signed)
Put coconut oil to vaginal area in the morning and at night. Can also use if having burning again. Call if vaginal bleeding.

## 2018-04-02 NOTE — Progress Notes (Signed)
63 y.o. Single Caucasian female [name] here with complaint of vaginal symptoms of  Burning when urine touched skin only. Thought she may have scratched herself., No increase discharge. Describes discharge as none. Onset of symptoms today.. Denies new personal products or vaginal dryness. Still using Dove soap to clean with and using coconut oil every hs.  Urinary symptoms none. Seeing  Dr. Delman Cheadle for other skin issues and staying under control now. No other health issues.  Review of Systems  Constitutional: Negative.   HENT: Negative.   Eyes: Negative.   Respiratory: Negative.   Cardiovascular: Negative.   Gastrointestinal: Negative.   Genitourinary: Negative.   Musculoskeletal: Negative.   Skin: Negative.        Vaginal burning  Neurological: Negative.   Endo/Heme/Allergies: Negative.   Psychiatric/Behavioral: Negative.     O:Healthy female WDWN Affect: normal, orientation x 3  Exam:Skin warm and dry Abdomen:soft,non tender,   Inquinal Lymph nodes: no enlargement or tenderness Pelvic exam: External genital: normal female, with right vulva enlarged with known sarcoid, no size change BUS: negative Vulva: small scratch area on inside of right vulva, tender, patient acknowledges this the area of burning, lubricant put on area with relief Vagina: atrophic, no discharge noted .Cervix/ Uterus: absent :Adnexa:normal, non tender, no masses or fullness noted   A:Normal pelvic exam S/P TAH with BSO Vulva scratch on right area of concern Mentally challenged   P:Discussed findings of Vulva scratch and etiology. Discussed coconut oil to area of concern twice daily and this should provide comfort and healing. Questions answered and instructions printed.  Rv prn

## 2018-04-02 NOTE — Telephone Encounter (Signed)
Patient is asking to talk with Regina Eck nurse. She states this is urgent.

## 2018-04-03 ENCOUNTER — Other Ambulatory Visit: Payer: Self-pay | Admitting: Family Medicine

## 2018-04-05 NOTE — Telephone Encounter (Signed)
Refill OK

## 2018-04-06 NOTE — Telephone Encounter (Signed)
Rx done. 

## 2018-04-20 ENCOUNTER — Encounter: Payer: Self-pay | Admitting: Gastroenterology

## 2018-04-28 ENCOUNTER — Ambulatory Visit: Payer: Medicare Other | Admitting: Podiatry

## 2018-05-06 ENCOUNTER — Ambulatory Visit: Payer: Medicare Other | Admitting: Certified Nurse Midwife

## 2018-05-18 ENCOUNTER — Telehealth: Payer: Self-pay | Admitting: Certified Nurse Midwife

## 2018-05-18 NOTE — Telephone Encounter (Signed)
Spoke with patient. Patient states that she has been showering and using coconut oil daily. Keeping everything clean and dry. Reports 1 episode of itching this morning. Denies any ongoing itching, vaginal redness, swelling, or discharge. Advised some vaginal itching can be normal. Advised with one episode okay to monitor. Advised if symptoms worsens or continues will need to be seen in the office. Patient verbalizes understanding.  Routing to provider and will close encounter.

## 2018-05-18 NOTE — Telephone Encounter (Signed)
Patient is still using coconut oil and has started itching again.

## 2018-05-24 ENCOUNTER — Ambulatory Visit: Payer: Medicare Other | Admitting: Family Medicine

## 2018-05-25 ENCOUNTER — Ambulatory Visit: Payer: Medicare Other | Admitting: Family Medicine

## 2018-06-10 ENCOUNTER — Other Ambulatory Visit: Payer: Self-pay | Admitting: Family Medicine

## 2018-06-15 ENCOUNTER — Telehealth: Payer: Self-pay | Admitting: Family Medicine

## 2018-06-15 ENCOUNTER — Telehealth: Payer: Self-pay | Admitting: *Deleted

## 2018-06-15 ENCOUNTER — Other Ambulatory Visit: Payer: Self-pay | Admitting: *Deleted

## 2018-06-15 MED ORDER — HYDROCHLOROTHIAZIDE 12.5 MG PO CAPS: 12.5000 mg | ORAL_CAPSULE | Freq: Every day | ORAL | 0 refills | Status: DC

## 2018-06-15 MED ORDER — DILTIAZEM HCL ER COATED BEADS 240 MG PO CP24: 240.0000 mg | ORAL_CAPSULE | Freq: Every day | ORAL | 0 refills | Status: DC

## 2018-06-15 MED ORDER — METFORMIN HCL 1000 MG PO TABS: 1000.0000 mg | ORAL_TABLET | Freq: Every day | ORAL | 0 refills | Status: DC

## 2018-06-15 NOTE — Telephone Encounter (Signed)
Copied from [place] [phone]. Topic: Appointment Scheduling - Scheduling Inquiry for Clinic >> Jun 15, 2018  8:44 AM Rayann Heman wrote: Reason for CRM: pt called and states that she would like a cal back about making appointment for June a virtual visit. Pt states that she will need medications refilled. Pt would like a call back regarding. Please advise

## 2018-06-15 NOTE — Telephone Encounter (Signed)
Copied from [place] [phone]. Topic: Quick Communication - Rx Refill/Question >> Jun 15, 2018  4:15 PM Leward Quan A wrote: Medication: ipratropium (ATROVENT) 0.06 % nasal spray  Has the patient contacted their pharmacy? Yes.   (Agent: If no, request that the patient contact the pharmacy for the refill.) (Agent: If yes, when and what did the pharmacy advise?)  Preferred Pharmacy (with phone number or street name): [id number] [place], [address]. AT [address] [place] [phone] (Phone) [phone] (Fax)    Agent: Please be advised that RX refills may take up to 3 business days. We ask that you follow-up with your pharmacy.

## 2018-06-15 NOTE — Telephone Encounter (Signed)
Copied from [place] [phone]. Topic: Quick Communication - Rx Refill/Question >> Jun 15, 2018 11:38 AM Virl Axe D wrote: Medication: diltiazem (CARDIZEM CD) 240 MG 24 hr capsule/ hydrochlorothiazide (MICROZIDE) 12.5 MG capsulemetFORMIN (GLUCOPHAGE) 1000 MG tablet  Has the patient contacted their pharmacy? Yes.   (Agent: If no, request that the patient contact the pharmacy for the refill.) (Agent: If yes, when and what did the pharmacy advise?)  Preferred Pharmacy (with phone number or street name): [id number] [place], [place]. AT [address]   Agent: Please be advised that RX refills may take up to 3 business days. We ask that you follow-up with your pharmacy.

## 2018-06-15 NOTE — Telephone Encounter (Signed)
Rxs done and the pt was informed.

## 2018-06-15 NOTE — Telephone Encounter (Signed)
Patient called and the appt was cancelled on 6/8.  Appt rescheduled for July per the pts request and refills were sent to the pharmacy on Metformin and HCTZ, Pravastatin was sent on 5/21 and the pt was informed of this also.

## 2018-06-15 NOTE — Telephone Encounter (Signed)
Rx sent for Diltiazem and the others were sent previously.

## 2018-06-16 MED ORDER — IPRATROPIUM BROMIDE 0.06 % NA SOLN: NASAL | 2 refills | Status: DC

## 2018-06-16 NOTE — Telephone Encounter (Signed)
Pt called this am and wants Dr Maudie Mercury to know she cannot get out to get to get her groceries or meds, because if she does she will be quarantined .  That is why she called early for this med.

## 2018-06-16 NOTE — Telephone Encounter (Signed)
Rx done. 

## 2018-06-24 ENCOUNTER — Ambulatory Visit (INDEPENDENT_AMBULATORY_CARE_PROVIDER_SITE_OTHER): Payer: Medicare Other | Admitting: Family Medicine

## 2018-06-24 ENCOUNTER — Other Ambulatory Visit: Payer: Self-pay

## 2018-06-24 ENCOUNTER — Telehealth: Payer: Self-pay | Admitting: Family Medicine

## 2018-06-24 ENCOUNTER — Telehealth: Payer: Self-pay | Admitting: *Deleted

## 2018-06-24 DIAGNOSIS — I10 Essential (primary) hypertension: Secondary | ICD-10-CM

## 2018-06-24 DIAGNOSIS — E119 Type 2 diabetes mellitus without complications: Secondary | ICD-10-CM | POA: Diagnosis not present

## 2018-06-24 DIAGNOSIS — J309 Allergic rhinitis, unspecified: Secondary | ICD-10-CM | POA: Diagnosis not present

## 2018-06-24 DIAGNOSIS — E785 Hyperlipidemia, unspecified: Secondary | ICD-10-CM

## 2018-06-24 DIAGNOSIS — E1169 Type 2 diabetes mellitus with other specified complication: Secondary | ICD-10-CM | POA: Diagnosis not present

## 2018-06-24 DIAGNOSIS — E1159 Type 2 diabetes mellitus with other circulatory complications: Secondary | ICD-10-CM

## 2018-06-24 MED ORDER — LORATADINE 10 MG PO TABS: 10.0000 mg | ORAL_TABLET | Freq: Every day | ORAL | 1 refills | Status: AC

## 2018-06-24 MED ORDER — LORATADINE 10 MG PO TABS: 10.0000 mg | ORAL_TABLET | Freq: Every day | ORAL | Status: DC

## 2018-06-24 MED ORDER — FLUTICASONE PROPIONATE 50 MCG/ACT NA SUSP: 2.0000 | Freq: Every day | NASAL | 3 refills | Status: DC

## 2018-06-24 NOTE — Telephone Encounter (Signed)
Did not advise singulair. Advised claritin and flonase. Can you make sure the claritin went to the pharmacy? Thanks.

## 2018-06-24 NOTE — Telephone Encounter (Signed)
Copied from [place] [phone]. Topic: General - Inquiry >> Jun 24, 2018 11:28 AM Mathis Bud wrote: Reason for CRM: [facility] called stating patient was refusing medication :fluticasone (FLONASE) 50 MCG/ACT nasal spray  Patient wanted "Singulair allergy medicine"  Told pharmacist to let patient know the doctor would have to approve something else, do not know how long it will take. Patient call back # [phone]

## 2018-06-24 NOTE — Telephone Encounter (Signed)
-----   Message from [name], DO sent at 06/24/2018 10:46 AM EDT ----- -please give her the number for [facility] to schedule her colonoscopy -please send her prescription if it printed

## 2018-06-24 NOTE — Telephone Encounter (Signed)
I called the pt and informed her of the message below.  She is aware the Rx for Loratadine was sent to the pharmacy.

## 2018-06-24 NOTE — Progress Notes (Signed)
Virtual Visit via Telephone Note  I connected with Linda Thompson on 06/24/18 at 10:30 AM EDT by telephone and verified that I am speaking with the correct person using two identifiers.   I discussed the limitations, risks, security and privacy concerns of performing an evaluation and management service by telephone and the availability of in person appointments. I also discussed with the patient that there may be a patient responsible charge related to this service. The patient expressed understanding and agreed to proceed.  Location patient: home Location provider: work or home office Participants present for the call: patient, provider Patient did not have a visit in the prior 7 days to address this/these issue(s).   History of Present Illness:  Follow up Allergies: -reports uses claritin and flonase -has seasonal allergies - nasal congestion, pnd -no change, fevers, sob -wants rx for these meds as will be cheaper for here -on review of HM she is due for her labs and for her colonoscopy, advised both   Observations/Objective: Patient sounds cheerful and well on the phone. I do not appreciate any SOB. Speech and thought processing are grossly intact. Patient reported vitals:  Assessment and Plan:  Seasonal allergies History of colon polyps Diabetes HTN  -sent claritin and flonase to the pharmacy -she sees Dr. Volanda Napoleon in June and plans to get her labs then -advised her to schedule her her colonoscopy -lifestyle recs -keep TOC with Dr. Volanda Napoleon as schedule  I did not refer this patient for an OV in the next 24 hours for this/these issue(s).  I discussed the assessment and treatment plan with the patient. The patient was provided an opportunity to ask questions and all were answered. The patient agreed with the plan and demonstrated an understanding of the instructions.   The patient was advised to call back or seek an in-person evaluation if the symptoms worsen or if the  condition fails to improve as anticipated.  I provided 21 minutes of non-face-to-face time during this encounter.  Follow up instructions: Advised assistant Wendie Simmer to help patient arrange the following: -please give her the number for [facility] to schedule her colonoscopy -please send her prescription if it printed  [name], DO

## 2018-06-24 NOTE — Telephone Encounter (Signed)
I called the pt and gave her the phone number of [phone] to call [facility].  Rx for generic Claritin was sent to the pharmacy.

## 2018-06-28 ENCOUNTER — Encounter: Payer: Medicare Other | Admitting: Family Medicine

## 2018-06-30 ENCOUNTER — Other Ambulatory Visit: Payer: Self-pay | Admitting: Family Medicine

## 2018-07-07 ENCOUNTER — Encounter: Payer: Self-pay | Admitting: Podiatry

## 2018-07-07 ENCOUNTER — Ambulatory Visit (INDEPENDENT_AMBULATORY_CARE_PROVIDER_SITE_OTHER): Payer: Medicare Other | Admitting: Podiatry

## 2018-07-07 ENCOUNTER — Other Ambulatory Visit: Payer: Self-pay

## 2018-07-07 DIAGNOSIS — M79675 Pain in left toe(s): Secondary | ICD-10-CM

## 2018-07-07 DIAGNOSIS — M79674 Pain in right toe(s): Secondary | ICD-10-CM | POA: Diagnosis not present

## 2018-07-07 DIAGNOSIS — B351 Tinea unguium: Secondary | ICD-10-CM | POA: Diagnosis not present

## 2018-07-07 DIAGNOSIS — E119 Type 2 diabetes mellitus without complications: Secondary | ICD-10-CM | POA: Insufficient documentation

## 2018-07-07 NOTE — Progress Notes (Signed)
Patient ID: NEVAE PINNIX, female   DOB: 1955/03/04, 63 y.o.   MRN: [id number] Complaint:  Visit Type: Patient returns to my office for continued preventative foot care services. Complaint: Patient states" my nails have grown long and thick and become painful to walk and wear shoes" Patient has been diagnosed with DM with no foot complications. The patient presents for preventative foot care services. No changes to ROS.    Podiatric Exam: Vascular: dorsalis pedis and posterior tibial pulses are palpable bilateral. Capillary return is immediate. Temperature gradient is WNL. Skin turgor WNL  Sensorium: Normal Semmes Weinstein monofilament test. Normal tactile sensation bilaterally. Nail Exam: Pt has thick disfigured discolored nails with subungual debris noted bilateral entire nail hallux through fifth toenails Ulcer Exam: There is no evidence of ulcer or pre-ulcerative changes or infection. Orthopedic Exam: Muscle tone and strength are WNL. No limitations in general ROM. No crepitus or effusions noted. Foot type and digits show no abnormalities. Bony prominences are unremarkable.  Skin: No Porokeratosis. No infection or ulcers  Diagnosis:  Onychomycosis, , Pain in right toe, pain in left toes  Treatment & Plan Procedures and Treatment: Consent by patient was obtained for treatment procedures. The patient understood the discussion of treatment and procedures well. All questions were answered thoroughly reviewed. Debridement of mycotic and hypertrophic toenails, 1 through 5 bilateral and clearing of subungual debris. No ulceration, no infection noted. Return Visit-Office Procedure: Patient instructed to return to the office for a follow up visit 3 months for continued evaluation and treatment.   Gardiner Barefoot DPM

## 2018-07-12 DIAGNOSIS — H04123 Dry eye syndrome of bilateral lacrimal glands: Secondary | ICD-10-CM | POA: Diagnosis not present

## 2018-07-12 DIAGNOSIS — E119 Type 2 diabetes mellitus without complications: Secondary | ICD-10-CM | POA: Diagnosis not present

## 2018-07-12 DIAGNOSIS — H401131 Primary open-angle glaucoma, bilateral, mild stage: Secondary | ICD-10-CM | POA: Diagnosis not present

## 2018-07-12 DIAGNOSIS — Z961 Presence of intraocular lens: Secondary | ICD-10-CM | POA: Diagnosis not present

## 2018-07-14 ENCOUNTER — Other Ambulatory Visit: Payer: Self-pay

## 2018-07-14 ENCOUNTER — Encounter: Payer: Self-pay | Admitting: Family Medicine

## 2018-07-14 ENCOUNTER — Ambulatory Visit (INDEPENDENT_AMBULATORY_CARE_PROVIDER_SITE_OTHER): Payer: Medicare Other | Admitting: Family Medicine

## 2018-07-14 VITALS — BP 118/70 | HR 96 | Temp 97.9°F | Wt 179.0 lb

## 2018-07-14 DIAGNOSIS — I10 Essential (primary) hypertension: Secondary | ICD-10-CM

## 2018-07-14 DIAGNOSIS — E1169 Type 2 diabetes mellitus with other specified complication: Secondary | ICD-10-CM | POA: Diagnosis not present

## 2018-07-14 DIAGNOSIS — H409 Unspecified glaucoma: Secondary | ICD-10-CM | POA: Diagnosis not present

## 2018-07-14 DIAGNOSIS — K219 Gastro-esophageal reflux disease without esophagitis: Secondary | ICD-10-CM | POA: Diagnosis not present

## 2018-07-14 DIAGNOSIS — E785 Hyperlipidemia, unspecified: Secondary | ICD-10-CM | POA: Diagnosis not present

## 2018-07-14 LAB — POCT GLYCOSYLATED HEMOGLOBIN (HGB A1C): Hemoglobin A1C: 6.2 % — AB (ref 4.0–5.6)

## 2018-07-14 MED ORDER — METFORMIN HCL 1000 MG PO TABS: ORAL_TABLET | ORAL | 0 refills | Status: DC

## 2018-07-14 NOTE — Patient Instructions (Signed)
Glaucoma  Glaucoma happens when the fluid pressure in the eyeball is too high. If the pressure stays high for too long, the eye may get damaged. This can cause a loss of vision. The most common type of glaucoma causes pressure in the eye to go up slowly. There may be no symptoms at first. Testing for this condition can help to find the condition before damage happens. Early treatment can often stop vision loss. Follow these instructions at home:  Take over-the-counter and prescription medicines only as told by your doctor. ? If you were given eye drops, use them exactly as told. You will probably need to use this medicine for the rest of your life.  Exercise often. Talk with your doctor about which types of exercise are safe for you. Do not do exercises where you lean your head on the floor while you lift part of your body off the floor (such as a headstand).  Keep all follow-up visits as told by your doctor. This is important. Contact a doctor if:  Your symptoms get worse.  You have new symptoms. Get help right away if:  You have bad pain in your eye.  You have vision problems.  You have a bad headache in the area around your eye.  You feel sick to your stomach (nauseous).  You throw up (vomit).  You start to have the same problems with your other eye. Summary  Glaucoma happens when the fluid pressure in the eyeball is too high. If this is not treated, it can cause a loss of vision.  There may be no symptoms at first. Testing can help to find the condition before damage happens.  Early treatment can often stop vision loss. This information is not intended to replace advice given to you by your health care provider. Make sure you discuss any questions you have with your health care provider. Document Released: 10/16/2007 Document Revised: 09/04/2016 Document Reviewed: 09/04/2016 Elsevier Interactive Patient Education  2019 [employer]  Preventing Diabetes Mellitus  Complications You can take action to prevent or slow down problems that are caused by diabetes (diabetes mellitus). Following your diabetes plan and taking care of yourself can reduce your risk of serious or life-threatening complications. What actions can I take to prevent diabetes complications? Manage your diabetes   Follow instructions from your health care providers about managing your diabetes. Your diabetes may be managed by a team of health care providers who can teach you how to care for yourself and can answer questions that you have.  Educate yourself about your condition so you can make healthy choices about eating and physical activity.  Check your blood sugar (glucose) levels as often as directed. Your health care provider will help you decide how often to check your blood glucose level depending on your treatment goals and how well you are meeting them.  Ask your health care provider if you should take low-dose aspirin daily and what dose is recommended for you. Taking low-dose aspirin daily is recommended to help prevent cardiovascular disease. Do not use nicotine or tobacco Do not use any products that contain nicotine or tobacco, such as cigarettes and e-cigarettes. If you need help quitting, ask your health care provider. Nicotine raises your risk for diabetes problems. If you quit using nicotine:  You will lower your risk for heart attack, stroke, nerve disease, and kidney disease.  Your cholesterol and blood pressure may improve.  Your blood circulation will improve. Keep your blood pressure under control  Your personal target blood pressure is determined based on:  Your age.  Your medicines.  How long you have had diabetes.  Any other medical conditions you have. To control your blood pressure:  Follow instructions from your health care provider about meal planning, exercise, and medicines.  Make sure your health care provider checks your blood pressure at  every medical visit.  Monitor your blood pressure at home as told by your health care provider.  Keep your cholesterol under control To control your cholesterol:  Follow instructions from your health care provider about meal planning, exercise, and medicines.  Have your cholesterol checked at least once a year.  You may be prescribed medicine to lower cholesterol (statin). If you are not taking a statin, ask your health care provider if you should be. Controlling your cholesterol may:  Help prevent heart disease and stroke. These are the most common health problems for people with diabetes.  Improve your blood flow. Schedule and keep yearly physical exams and eye exams Your health care provider will tell you how often you need medical visits depending on your diabetes management plan. Keep all follow-up visits as directed. This is important so possible problems can be identified early and complications can be avoided or treated.  Every visit with your health care provider should include measuring your: ? Weight. ? Blood pressure. ? Blood glucose control.  Your A1c (hemoglobin A1c) level should be checked: ? At least 2 times a year, if you are meeting your treatment goals. ? 4 times a year, if you are not meeting treatment goals or if your treatment goals have changed.  Your blood lipids (lipid profile) should be checked yearly. You should also be checked yearly for protein in your urine (urine microalbumin).  If you have type 1 diabetes, get an eye exam 3-5 years after you are diagnosed, and then once a year after your first exam.  If you have type 2 diabetes, get an eye exam as soon as you are diagnosed, and then once a year after your first exam. Keep your vaccines current It is recommended that you receive:  A flu (influenza) vaccine every year.  A pneumonia (pneumococcal) vaccine and a hepatitis B vaccine. If you are age 89 or older, you may get the pneumonia vaccine as a  series of two separate shots. Ask your health care provider which other vaccines may be recommended. Take care of your feet Diabetes may cause you to have poor blood circulation to your legs and feet. Because of this, taking care of your feet is very important. Diabetes can cause:  The skin on the feet to get thinner, break more easily, and heal more slowly.  Nerve damage in your legs and feet, which results in decreased feeling. You may not notice minor injuries that could lead to serious problems. To avoid foot problems:  Check your skin and feet every day for cuts, bruises, redness, blisters, or sores.  Schedule a foot exam with your health care provider once every year. This exam includes: ? Inspecting of the structure and skin of your feet. ? Checking the pulses and sensation in your feet.  Make sure that your health care provider performs a visual foot exam at every medical visit.  Take care of your teeth People with poorly controlled diabetes are more likely to have gum (periodontal) disease. Diabetes can make periodontal diseases harder to control. If not treated, periodontal diseases can lead to tooth loss. To prevent this:  Brush  your teeth twice a day.  Floss at least once a day.  Visit your dentist 2 times a year. Drink responsibly Limit alcohol intake to no more than 1 drink a day for nonpregnant women and 2 drinks a day for men. One drink equals 12 oz of beer, 5 oz of wine, or 1 oz of hard liquor.  It is important to eat food when you drink alcohol to avoid low blood glucose (hypoglycemia). Avoid alcohol if you:  Have a history of alcohol abuse or dependence.  Are pregnant.  Have liver disease, pancreatitis, advanced neuropathy, or severe hypertriglyceridemia. Lessen stress Living with diabetes can be stressful. When you are experiencing stress, your blood glucose may be affected in two ways:  Stress hormones may cause your blood glucose to rise.  You may be  distracted from taking good care of yourself. Be aware of your stress level and make changes to help you manage challenging situations. To lower your stress levels:  Consider joining a support group.  Do planned relaxation or meditation.  Do a hobby that you enjoy.  Maintain healthy relationships.  Exercise regularly.  Work with your health care provider or a mental health professional. Summary  You can take action to prevent or slow down problems that are caused by diabetes (diabetes mellitus). Following your diabetes plan and taking care of yourself can reduce your risk of serious or life-threatening complications.  Follow instructions from your health care providers about managing your diabetes. Your diabetes may be managed by a team of health care providers who can teach you how to care for yourself and can answer questions that you have.  Your health care provider will tell you how often you need medical visits depending on your diabetes management plan. Keep all follow-up visits as directed. This is important so possible problems can be identified early and complications can be avoided or treated. This information is not intended to replace advice given to you by your health care provider. Make sure you discuss any questions you have with your health care provider. Document Released: 09/24/2010 Document Revised: 08/26/2016 Document Reviewed: 10/06/2015 Elsevier Interactive Patient Education  2019 [employer].  Managing Your Hypertension Hypertension is commonly called high blood pressure. This is when the force of your blood pressing against the walls of your arteries is too strong. Arteries are blood vessels that carry blood from your heart throughout your body. Hypertension forces the heart to work harder to pump blood, and may cause the arteries to become narrow or stiff. Having untreated or uncontrolled hypertension can cause heart attack, stroke, kidney disease, and other  problems. What are blood pressure readings? A blood pressure reading consists of a higher number over a lower number. Ideally, your blood pressure should be below 120/80. The first ("top") number is called the systolic pressure. It is a measure of the pressure in your arteries as your heart beats. The second ("bottom") number is called the diastolic pressure. It is a measure of the pressure in your arteries as the heart relaxes. What does my blood pressure reading mean? Blood pressure is classified into four stages. Based on your blood pressure reading, your health care provider may use the following stages to determine what type of treatment you need, if any. Systolic pressure and diastolic pressure are measured in a unit called mm Hg. Normal  Systolic pressure: below 416.  Diastolic pressure: below 80. Elevated  Systolic pressure: 606-301.  Diastolic pressure: below 80. Hypertension stage 1  Systolic pressure:  130-139.  Diastolic pressure: 74-16. Hypertension stage 2  Systolic pressure: 384 or above.  Diastolic pressure: 90 or above. What health risks are associated with hypertension? Managing your hypertension is an important responsibility. Uncontrolled hypertension can lead to:  A heart attack.  A stroke.  A weakened blood vessel (aneurysm).  Heart failure.  Kidney damage.  Eye damage.  Metabolic syndrome.  Memory and concentration problems. What changes can I make to manage my hypertension? Hypertension can be managed by making lifestyle changes and possibly by taking medicines. Your health care provider will help you make a plan to bring your blood pressure within a normal range. Eating and drinking   Eat a diet that is high in fiber and potassium, and low in salt (sodium), added sugar, and fat. An example eating plan is called the DASH (Dietary Approaches to Stop Hypertension) diet. To eat this way: ? Eat plenty of fresh fruits and vegetables. Try to fill half  of your plate at each meal with fruits and vegetables. ? Eat whole grains, such as whole wheat pasta, brown rice, or whole grain bread. Fill about one quarter of your plate with whole grains. ? Eat low-fat diary products. ? Avoid fatty cuts of meat, processed or cured meats, and poultry with skin. Fill about one quarter of your plate with lean proteins such as fish, chicken without skin, beans, eggs, and tofu. ? Avoid premade and processed foods. These tend to be higher in sodium, added sugar, and fat.  Reduce your daily sodium intake. Most people with hypertension should eat less than 1,500 mg of sodium a day.  Limit alcohol intake to no more than 1 drink a day for nonpregnant women and 2 drinks a day for men. One drink equals 12 oz of beer, 5 oz of wine, or 1 oz of hard liquor. Lifestyle  Work with your health care provider to maintain a healthy body weight, or to lose weight. Ask what an ideal weight is for you.  Get at least 30 minutes of exercise that causes your heart to beat faster (aerobic exercise) most days of the week. Activities may include walking, swimming, or biking.  Include exercise to strengthen your muscles (resistance exercise), such as weight lifting, as part of your weekly exercise routine. Try to do these types of exercises for 30 minutes at least 3 days a week.  Do not use any products that contain nicotine or tobacco, such as cigarettes and e-cigarettes. If you need help quitting, ask your health care provider.  Control any long-term (chronic) conditions you have, such as high cholesterol or diabetes. Monitoring  Monitor your blood pressure at home as told by your health care provider. Your personal target blood pressure may vary depending on your medical conditions, your age, and other factors.  Have your blood pressure checked regularly, as often as told by your health care provider. Working with your health care provider  Review all the medicines you take with  your health care provider because there may be side effects or interactions.  Talk with your health care provider about your diet, exercise habits, and other lifestyle factors that may be contributing to hypertension.  Visit your health care provider regularly. Your health care provider can help you create and adjust your plan for managing hypertension. Will I need medicine to control my blood pressure? Your health care provider may prescribe medicine if lifestyle changes are not enough to get your blood pressure under control, and if:  Your systolic blood  pressure is 130 or higher.  Your diastolic blood pressure is 80 or higher. Take medicines only as told by your health care provider. Follow the directions carefully. Blood pressure medicines must be taken as prescribed. The medicine does not work as well when you skip doses. Skipping doses also puts you at risk for problems. Contact a health care provider if:  You think you are having a reaction to medicines you have taken.  You have repeated (recurrent) headaches.  You feel dizzy.  You have swelling in your ankles.  You have trouble with your vision. Get help right away if:  You develop a severe headache or confusion.  You have unusual weakness or numbness, or you feel faint.  You have severe pain in your chest or abdomen.  You vomit repeatedly.  You have trouble breathing. Summary  Hypertension is when the force of blood pumping through your arteries is too strong. If this condition is not controlled, it may put you at risk for serious complications.  Your personal target blood pressure may vary depending on your medical conditions, your age, and other factors. For most people, a normal blood pressure is less than 120/80.  Hypertension is managed by lifestyle changes, medicines, or both. Lifestyle changes include weight loss, eating a healthy, low-sodium diet, exercising more, and limiting alcohol. This information is not  intended to replace advice given to you by your health care provider. Make sure you discuss any questions you have with your health care provider. Document Released: 10/01/2011 Document Revised: 12/05/2015 Document Reviewed: 12/05/2015 Elsevier Interactive Patient Education  2019 [employer].
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
## 2018-07-15 ENCOUNTER — Other Ambulatory Visit: Payer: Self-pay

## 2018-07-15 ENCOUNTER — Encounter (INDEPENDENT_AMBULATORY_CARE_PROVIDER_SITE_OTHER): Payer: Medicare Other | Admitting: Ophthalmology

## 2018-07-15 DIAGNOSIS — H43813 Vitreous degeneration, bilateral: Secondary | ICD-10-CM

## 2018-07-15 DIAGNOSIS — H34832 Tributary (branch) retinal vein occlusion, left eye, with macular edema: Secondary | ICD-10-CM | POA: Diagnosis not present

## 2018-07-15 DIAGNOSIS — H33302 Unspecified retinal break, left eye: Secondary | ICD-10-CM

## 2018-07-15 DIAGNOSIS — H35033 Hypertensive retinopathy, bilateral: Secondary | ICD-10-CM | POA: Diagnosis not present

## 2018-07-15 DIAGNOSIS — I10 Essential (primary) hypertension: Secondary | ICD-10-CM

## 2018-07-16 ENCOUNTER — Encounter: Payer: Self-pay | Admitting: Family Medicine

## 2018-07-19 ENCOUNTER — Ambulatory Visit: Payer: Medicare Other | Admitting: Certified Nurse Midwife

## 2018-07-19 NOTE — Addendum Note (Signed)
Addended by: Wyvonne Lenz on: 07/19/2018 09:13 AM   Modules accepted: Orders

## 2018-07-21 ENCOUNTER — Ambulatory Visit (INDEPENDENT_AMBULATORY_CARE_PROVIDER_SITE_OTHER): Payer: Medicare Other | Admitting: Certified Nurse Midwife

## 2018-07-21 ENCOUNTER — Other Ambulatory Visit: Payer: Self-pay

## 2018-07-21 ENCOUNTER — Encounter: Payer: Self-pay | Admitting: Certified Nurse Midwife

## 2018-07-21 VITALS — BP 120/80 | HR 70 | Temp 97.3°F | Resp 16 | Ht 62.0 in | Wt 180.0 lb

## 2018-07-21 DIAGNOSIS — Z01419 Encounter for gynecological examination (general) (routine) without abnormal findings: Secondary | ICD-10-CM | POA: Diagnosis not present

## 2018-07-21 DIAGNOSIS — Z124 Encounter for screening for malignant neoplasm of cervix: Secondary | ICD-10-CM | POA: Diagnosis not present

## 2018-07-21 DIAGNOSIS — Z8639 Personal history of other endocrine, nutritional and metabolic disease: Secondary | ICD-10-CM

## 2018-07-21 DIAGNOSIS — Z780 Asymptomatic menopausal state: Secondary | ICD-10-CM

## 2018-07-21 NOTE — Progress Notes (Signed)
Faxed order for BMD to [facility]. They will schedule this the same day of her MMG.

## 2018-07-21 NOTE — Progress Notes (Signed)
63 y.o. [name] Single  Caucasian Fe here for annual exam. Patient denies vaginal bleeding, hot flashes or night sweats. She feels her vaginal area is well, no itching or discharge. Recent PCP change to Dr. Volanda Napoleon, not seeing Dr Maudie Mercury now. Diabetes per patient stable, but "I have gained some weight again". Had issue with eye and was seen by Retinal specialist and required injection.? Glaucoma Etiology, friend took her for appointment. Taking care of her own cleaning now due to Covid restrictions where she lives. No blood in stool or stomach concerns. "Feel Ok, no problems". Continues with coconut oil for vaginal dryness and occasional itching. No other health concerns today.  Patient's last menstrual period was 01/21/1992 (approximate).          Sexually active: No.  The current method of family planning is status post hysterectomy.    Exercising: Yes.    walking some Smoker:  no  Review of Systems  Constitutional: Negative.   HENT: Negative.   Eyes: Negative.   Respiratory: Negative.   Cardiovascular: Negative.   Gastrointestinal: Negative.   Genitourinary: Negative.   Musculoskeletal: Negative.   Skin: Negative.   Neurological: Negative.   Endo/Heme/Allergies: Negative.   Psychiatric/Behavioral: Negative.     Health Maintenance: Pap:  01-16-14 neg History of Abnormal Pap: no MMG:  09-25-17 category a density birads 2:neg Self Breast exams: yes Colonoscopy:  2017 negative BMD:   unsure TDaP:  2016 Shingles: 2019 Pneumonia: 2011 Hep C and HIV: hep c neg 2017, HIV neg 2016 Labs: PCP   reports that she has never smoked. She has never used smokeless tobacco. She reports that she does not drink alcohol or use drugs.  Past Medical History:  Diagnosis Date  . Allergic rhinitis   . Allergy   . Anemia   . Chronic low back pain   . DDD (degenerative disc disease), lumbar   . GERD (gastroesophageal reflux disease)    w/ hx diaphragmatic hernia  . Glaucoma   . GLAUCOMA NOS 03/11/2006    Qualifier: Diagnosis of  By: Diona Browner MD, Amy    . Heart murmur   . Hx of fracture    R arm, foot  . Hyperlipemia   . Hypertension   . IBS (irritable bowel syndrome)   . Low back pain 11/22/2012  . Meningioma 12/20/2001   Repeat MR from 2017 - radiology feels not a meningioma, but rather "Thickening of the anterior falx appears to be related to degenerative ossification and not meningioma." HCPOA opted not to do repeat imaging with CT for the possible hemangioma and declined further evaluation of this.  . Meningioma ([place])    ant falx, stable on prior imaging  . Moderate intellectual disabilities   . Murmur   . Obesity   . Osteoarthrosis, unspecified whether generalized or localized, ankle and foot   . Sarcoidosis    difuse bony lesions and LDA, dx by biopsy in 2016  . Transaminitis   . Type II or unspecified type diabetes mellitus without mention of complication, not stated as uncontrolled     Past Surgical History:  Procedure Laterality Date  . CATARACT EXTRACTION Bilateral    Dr Katy Fitch  . COLONOSCOPY  04/15/2011  . ELBOW SURGERY     right elbow  . LIPOMA EXCISION Left [id number]   posterior left axilla  . TOTAL ABDOMINAL HYSTERECTOMY  03/05/1992   leiomyoma and cellular leiomyoma    Current Outpatient Medications  Medication Sig Dispense Refill  . acetaminophen (TYLENOL) 500 MG  tablet Take 500 mg by mouth every 6 (six) hours as needed for mild pain.     Marland Kitchen diclofenac sodium (VOLTAREN) 1 % GEL APPLY 2 GRAMS TO AFFECTED AREA 4 TIMES A DAY (Patient taking differently: Apply 2 g topically 4 (four) times daily. ) 100 g 1  . diltiazem (CARDIZEM CD) 240 MG 24 hr capsule Take 1 capsule (240 mg total) by mouth daily. 90 capsule 0  . ferrous sulfate 325 (65 FE) MG tablet Take 325 mg by mouth 3 (three) times daily with meals.     . fluticasone (FLONASE) 50 MCG/ACT nasal spray Place 2 sprays into both nostrils at bedtime. 16 g 3  . hydrochlorothiazide (MICROZIDE) 12.5 MG capsule Take 1  capsule (12.5 mg total) by mouth daily. 90 capsule 0  . ipratropium (ATROVENT) 0.06 % nasal spray PLACE 2 SPRAYS INTO BOTH NOSTRILS 4 (FOUR) TIMES DAILY. 45 mL 2  . ketoconazole (NIZORAL) 2 % shampoo APPLY TO AFFECTED AREA TWICE A WEEK AS DIRECTED 120 mL 5  . latanoprost (XALATAN) 0.005 % ophthalmic solution Place 1 drop into both eyes at bedtime.     Marland Kitchen loratadine (CLARITIN) 10 MG tablet Take 1 tablet (10 mg total) by mouth daily. 90 tablet 1  . metFORMIN (GLUCOPHAGE) 1000 MG tablet TAKE 1 TABLET TWICE A DAY WITH A MEAL 180 tablet 0  . omeprazole (PRILOSEC) 20 MG capsule TAKE 1 CAPSULE (20 MG TOTAL) BY MOUTH 2 (TWO) TIMES DAILY. (Patient taking differently: Take 20 mg by mouth daily. ) 180 capsule 1  . pravastatin (PRAVACHOL) 40 MG tablet TAKE 1 TABLET BY MOUTH EVERY DAY IN THE EVENING 90 tablet 1  . Vitamin D, Cholecalciferol, 25 MCG (1000 UT) CAPS Take 1,000 Units by mouth daily.    . Vitamins-Lipotropics ([employer] INNER EAR PLUS PO) Take 2 tablets by mouth 2 (two) times daily.      No current facility-administered medications for this visit.     Family History  Problem Relation Age of Onset  . Diabetes Mother   . Stroke Mother   . Hypertension Mother   . Stroke Father   . Diabetes Maternal Aunt   . Heart disease Maternal Aunt         (had pacemaker)  . Diabetes Maternal Grandmother   . Stroke Maternal Grandmother   . Hypertension Maternal Grandmother   . Colon cancer Neg Hx     ROS:  Pertinent items are noted in HPI.  Otherwise, a comprehensive ROS was negative.  Exam:   LMP 01/21/1992 (Approximate)    Ht Readings from Last 3 Encounters:  03/22/18 5\' 2"  (1.575 m)  02/23/18 5\' 2"  (1.575 m)  12/22/17 5\' 2"  (1.575 m)    General appearance: alert, cooperative and appears stated age Head: Normocephalic, without obvious abnormality, atraumatic Neck: no adenopathy, supple, symmetrical, trachea midline and thyroid normal to inspection and palpation Lungs: clear to auscultation  bilaterally Breasts: normal appearance, no masses or tenderness, No nipple retraction or dimpling, No nipple discharge or bleeding, No axillary or supraclavicular adenopathy Heart: regular rate and rhythm Abdomen: soft, non-tender; no masses,  no organomegaly Extremities: extremities normal, atraumatic, no cyanosis or edema Skin: Skin color, texture, turgor normal. No rashes or lesions Lymph nodes: Cervical, supraclavicular, and axillary nodes normal. No abnormal inguinal nodes palpated Neurologic: Grossly normal   Pelvic: External genitalia:  no lesions              Urethra:  normal appearing urethra with no masses, tenderness or lesions  Bartholin's and Skene's: normal                 Vagina: normal appearing vagina with normal color and discharge, no lesions              Cervix: absent              Pap taken: No. Bimanual Exam:  Uterus:  uterus absent              Adnexa: normal adnexa and no mass, fullness, tenderness               Rectovaginal: Confirms               Anus:  normal sphincter tone, no lesions  Chaperone present: yes  A:  Well Woman with normal exam  Post menopausal with atrophic vaginitis  BMD due will schedule with mammogram  Sarcoidosis original site right vulva, no area palpates smaller  Dermatology management of eczema and chronic yeast  Mentally challenged, cares for self  Diabetes/cholesterol with PCP management  Overweight fixes own meals now  P:   Reviewed health and wellness pertinent to exam  Aware if she has vaginal bleeding she needs to be seen.  Patient will schedule with mammogram, order faxed.  Discussed vulva has decreased in size  Continue follow up with Dermatology, PCP,GI and retinal specialist as scheduled.  Work on diet again as before to maintain weight loss.  Pap smear: no  counseled on breast self exam, mammography screening, feminine hygiene, adequate intake of calcium and vitamin D, diet and exercise  return annually  or prn  An After Visit Summary was printed and given to the patient.

## 2018-07-22 ENCOUNTER — Telehealth: Payer: Self-pay | Admitting: *Deleted

## 2018-07-22 NOTE — Telephone Encounter (Signed)
Patient called to say that when the paper work is completed by the doctor please call her to come and pick the paper up so that she can sign it and send it to her [place] office. Also patient will be going in for her Bone Density on 07/29/2018.

## 2018-07-22 NOTE — Telephone Encounter (Signed)
Copied from [place] [id number]. Topic: General - Other >> Jul 22, 2018 12:18 PM Keene Breath wrote: Reason for CRM: Patient called to check the status of some papers that are supposed to be signed and faxed.  Please call patient back at [phone]

## 2018-07-22 NOTE — Telephone Encounter (Signed)
Spoke to pt verbalized understanding that her previous provider and Dr Volanda Napoleon are not able to complete the forms. Pt states to mail the paperwork to her address on Monday so she can take it to her specialist.

## 2018-07-22 NOTE — Telephone Encounter (Signed)
Pt paperwork was faxed to Dr Maudie Mercury for completing

## 2018-07-26 NOTE — Telephone Encounter (Signed)
Pt paperwork has been mailed out to the address in pt chart as requested.

## 2018-07-27 ENCOUNTER — Encounter: Payer: Medicare Other | Admitting: Family Medicine

## 2018-07-27 ENCOUNTER — Other Ambulatory Visit: Payer: Self-pay | Admitting: Family Medicine

## 2018-07-28 ENCOUNTER — Telehealth: Payer: Self-pay | Admitting: *Deleted

## 2018-07-28 ENCOUNTER — Ambulatory Visit (INDEPENDENT_AMBULATORY_CARE_PROVIDER_SITE_OTHER): Payer: Medicare Other | Admitting: Family Medicine

## 2018-07-28 ENCOUNTER — Other Ambulatory Visit: Payer: Self-pay

## 2018-07-28 ENCOUNTER — Encounter: Payer: Self-pay | Admitting: Family Medicine

## 2018-07-28 DIAGNOSIS — J029 Acute pharyngitis, unspecified: Secondary | ICD-10-CM | POA: Diagnosis not present

## 2018-07-28 NOTE — Telephone Encounter (Signed)
Copied from [place] [phone]. Topic: General - Other >> Jul 28, 2018 10:08 AM Antonieta Iba C wrote: Reason for CRM: pt is calling in to schedule an apt with PCP for a sore throat.   Please call pt back for scheduling.  Clinic RN spoke with patient. Patient has tired gargling with salt water, hot tea, and throat lozenges. Patient is also taking allergy medication. Scheduled patient a phone call visit with Dr. Martinique today at [facility].

## 2018-07-28 NOTE — Progress Notes (Signed)
Virtual Visit via Telephone Note  I connected with Linda Thompson on 07/28/18 at  3:00 PM EDT by telephone and verified that I am speaking with the correct person using two identifiers.   I discussed the limitations, risks, security and privacy concerns of performing an evaluation and management service by telephone and the availability of in person appointments. I also discussed with the patient that there may be a patient responsible charge related to this service. The patient expressed understanding and agreed to proceed.  Location patient: home Location provider: home office Participants present for the call: patient, provider Patient did not have a visit in the prior 7 days to address this/these issue(s).   History of Present Illness:  Linda Thompson is a 63 yo female with Hx of DM II,HTN,and allergies c/o "little: sore throat. She believes she is "coming down with a cold." She has tried gargles with warm salty water.   +Sneezing and postnasal drainage.  Denies fever,chills,fatigue,body aches,cough,CP,wheezing,dyspnea,abdominal pain,N/V,changes in bowel habits,or rash. She has not noted throat erythema,exudate,or enlarged tonsils. Negative for dysphagia or stridor.  No sick contact or recent travel.  Hx of allergic rhinitis,she is on Flonase nasal spray and Loratadine 10 mg daily.  Observations/Objective: Patient sounds cheerful and well on the phone. I do not appreciate any SOB or cough. Normal voice tone and no stridor. Speech and thought processing are grossly intact. Patient reported vitals:N/A  Assessment and Plan:  1. Sore throat Possible etiologies discussed. No fever or other acute symptoms,so I do not think further work up is needed at this time. Monitor temp daily and for new symptoms. Social distancing and mask use recommended.  2. Pharyngitis, unspecified etiology ? Allergic. Hard to evaluate with tel visit but Hx does not suggest bacterial etiology,so I do not  think abx is needed at this time. Clearly instructed about warning signs,she voices understanding.  Follow Up Instructions:  OTC Ricola throat drops. Continue gargles with saline water. She will monitor for fever,chough,or other new symptoms.  I did not refer this patient for an OV in the next 24 hours for this/these issue(s).  I discussed the assessment and treatment plan with the patient. She was provided an opportunity to ask questions and all were answered. She agreed with the plan and demonstrated an understanding of the instructions.   The patient was advised to call back or seek an in-person evaluation if the symptoms worsen or if the condition fails to improve as anticipated.  I provided 16 minutes of non-face-to-face time during this encounter.   [name], MD

## 2018-08-12 ENCOUNTER — Encounter (INDEPENDENT_AMBULATORY_CARE_PROVIDER_SITE_OTHER): Payer: Medicare Other | Admitting: Ophthalmology

## 2018-08-12 ENCOUNTER — Other Ambulatory Visit: Payer: Self-pay

## 2018-08-12 DIAGNOSIS — H34832 Tributary (branch) retinal vein occlusion, left eye, with macular edema: Secondary | ICD-10-CM

## 2018-08-12 DIAGNOSIS — H35033 Hypertensive retinopathy, bilateral: Secondary | ICD-10-CM

## 2018-08-12 DIAGNOSIS — H43813 Vitreous degeneration, bilateral: Secondary | ICD-10-CM | POA: Diagnosis not present

## 2018-08-12 DIAGNOSIS — I10 Essential (primary) hypertension: Secondary | ICD-10-CM | POA: Diagnosis not present

## 2018-08-15 ENCOUNTER — Other Ambulatory Visit: Payer: Self-pay | Admitting: Family Medicine

## 2018-08-17 ENCOUNTER — Other Ambulatory Visit: Payer: Self-pay | Admitting: Family Medicine

## 2018-08-25 ENCOUNTER — Ambulatory Visit: Payer: Self-pay | Admitting: Family Medicine

## 2018-08-25 ENCOUNTER — Telehealth: Payer: Self-pay

## 2018-08-25 NOTE — Telephone Encounter (Signed)
Pt. Reports she had some nausea last night and now has some "burning in my stomach." No pain. Waiting to hear about an appointment.  Answer Assessment - Initial Assessment Questions 1. LOCATION: "Where does it hurt?"      Stomach is burning 2. RADIATION: "Does the pain shoot anywhere else?" (e.g., chest, back)     No 3. ONSET: "When did the pain begin?" (e.g., minutes, hours or days ago)      This morning 4. SUDDEN: "Gradual or sudden onset?"     Sudden 5. PATTERN "Does the pain come and go, or is it constant?"    - If constant: "Is it getting better, staying the same, or worsening?"      (Note: Constant means the pain never goes away completely; most serious pain is constant and it progresses)     - If intermittent: "How long does it last?" "Do you have pain now?"     (Note: Intermittent means the pain goes away completely between bouts)     Constant 6. SEVERITY: "How bad is the pain?"  (e.g., Scale 1-10; mild, moderate, or severe)    - MILD (1-3): doesn't interfere with normal activities, abdomen soft and not tender to touch     - MODERATE (4-7): interferes with normal activities or awakens from sleep, tender to touch     - SEVERE (8-10): excruciating pain, doubled over, unable to do any normal activities       Mild  7. RECURRENT SYMPTOM: "Have you ever had this type of abdominal pain before?" If so, ask: "When was the last time?" and "What happened that time?"      No 8. AGGRAVATING FACTORS: "Does anything seem to cause this pain?" (e.g., foods, stress, alcohol)     No 9. CARDIAC SYMPTOMS: "Do you have any of the following symptoms: chest pain, difficulty breathing, sweating, nausea?"     No 10. OTHER SYMPTOMS: "Do you have any other symptoms?" (e.g., fever, vomiting, diarrhea)       Nausea last night 11. PREGNANCY: "Is there any chance you are pregnant?" "When was your last menstrual period?"       No  Protocols used: ABDOMINAL PAIN - UPPER-A-AH

## 2018-08-25 NOTE — Telephone Encounter (Signed)
Patient called this afternoon wanting to talk to Dr. Volanda Napoleon about her symptoms. I was trying to schedule her a virtual appointment but she doesn't want a virtual she wants to come in. I told her with her symptoms that she can not come in the office. She said that she went to the bathroom 3 times last night and coughs so much like she wants to throw up/stomach feels like it is burning.   Please Advise

## 2018-08-25 NOTE — Telephone Encounter (Signed)
Dr. Volanda Napoleon please advise on what patient can do

## 2018-08-25 NOTE — Telephone Encounter (Signed)
Pt called to report she started feeling like she needed to cough. She took 2 lozenges (similar to Zycam) to help with her cough. During the night she began coughing more and felt like she was going to vomit. She went to the bathroom, to the toilet and had some dry heaves and spit up some mucus. She denies any fever or other symptoms. She wants to come in to the office to be evaluated. I advised we are not seeing any sick pt's in the office face-to-face and offered telephone visit. She declined. She only wants to be seen in the office. Advised I would send message to clinic for recommendations.

## 2018-08-26 NOTE — Telephone Encounter (Signed)
Spoke with patient. Informed patient of Dr. Volanda Napoleon message. Patient verbalized understanding. Patient report she is much better now

## 2018-08-26 NOTE — Telephone Encounter (Signed)
Pt's acid reflux could be contributing to the burning in her stomach, coughing, and post tussive emesis.  Agree pt should be evaluated, however given her current symptoms she is unable to be seen in office.

## 2018-08-27 ENCOUNTER — Telehealth: Payer: Self-pay | Admitting: Family Medicine

## 2018-08-27 DIAGNOSIS — N3000 Acute cystitis without hematuria: Secondary | ICD-10-CM | POA: Diagnosis not present

## 2018-08-27 DIAGNOSIS — R05 Cough: Secondary | ICD-10-CM | POA: Diagnosis not present

## 2018-08-27 DIAGNOSIS — R350 Frequency of micturition: Secondary | ICD-10-CM | POA: Diagnosis not present

## 2018-08-27 NOTE — Telephone Encounter (Signed)
Copied from [place] [id number]. Topic: General - Other >> Aug 27, 2018  1:11 PM Sheran Luz wrote: See NT encounter from 8/5 (unable to addend). Patient has went to urgent care and was treated for "infection". Patient cannot remember what type of infection.

## 2018-08-31 ENCOUNTER — Ambulatory Visit: Payer: Medicare Other | Admitting: Family Medicine

## 2018-08-31 ENCOUNTER — Ambulatory Visit: Payer: Medicare Other | Admitting: Podiatry

## 2018-09-02 ENCOUNTER — Ambulatory Visit (INDEPENDENT_AMBULATORY_CARE_PROVIDER_SITE_OTHER): Payer: Medicare Other

## 2018-09-02 ENCOUNTER — Other Ambulatory Visit: Payer: Self-pay

## 2018-09-02 ENCOUNTER — Ambulatory Visit (INDEPENDENT_AMBULATORY_CARE_PROVIDER_SITE_OTHER): Payer: Medicare Other | Admitting: Podiatry

## 2018-09-02 ENCOUNTER — Other Ambulatory Visit: Payer: Self-pay | Admitting: Podiatry

## 2018-09-02 DIAGNOSIS — M79671 Pain in right foot: Secondary | ICD-10-CM

## 2018-09-02 DIAGNOSIS — M2041 Other hammer toe(s) (acquired), right foot: Secondary | ICD-10-CM

## 2018-09-02 DIAGNOSIS — M19072 Primary osteoarthritis, left ankle and foot: Secondary | ICD-10-CM

## 2018-09-02 NOTE — Progress Notes (Signed)
Subjective:  Patient ID: Linda Thompson, female    DOB: 05/08/1955,  MRN: [id number]  Chief Complaint  Patient presents with  . Foot Pain    Pt states had left foot pain "hurts to stand on it". Happened " a few days ago" not currently causing pain. No known injury. Pain located left dorsal foot.    63 y.o. female presents with the above complaint. Hx as above.   Review of Systems: Negative except as noted in the HPI. Denies N/V/F/Ch.  Past Medical History:  Diagnosis Date  . Allergic rhinitis   . Allergy   . Anemia   . Chronic low back pain   . DDD (degenerative disc disease), lumbar   . GERD (gastroesophageal reflux disease)    w/ hx diaphragmatic hernia  . Glaucoma   . GLAUCOMA NOS 03/11/2006   Qualifier: Diagnosis of  By: Diona Browner MD, Amy    . Heart murmur   . Hx of fracture    R arm, foot  . Hyperlipemia   . Hypertension   . IBS (irritable bowel syndrome)   . Low back pain 11/22/2012  . Meningioma 12/20/2001   Repeat MR from 2017 - radiology feels not a meningioma, but rather "Thickening of the anterior falx appears to be related to degenerative ossification and not meningioma." HCPOA opted not to do repeat imaging with CT for the possible hemangioma and declined further evaluation of this.  . Meningioma ([place])    ant falx, stable on prior imaging  . Moderate intellectual disabilities   . Murmur   . Obesity   . Osteoarthrosis, unspecified whether generalized or localized, ankle and foot   . Sarcoidosis    difuse bony lesions and LDA, dx by biopsy in 2016  . Transaminitis   . Type II or unspecified type diabetes mellitus without mention of complication, not stated as uncontrolled     Current Outpatient Medications:  .  acetaminophen (TYLENOL) 500 MG tablet, Take 500 mg by mouth every 6 (six) hours as needed for mild pain. , Disp: , Rfl:  .  Besifloxacin HCl (BESIVANCE) 0.6 % SUSP, Apply to eye. 4 times each day for 2 days, Disp: , Rfl:  .  diclofenac sodium (VOLTAREN)  1 % GEL, APPLY 2 GRAMS TO AFFECTED AREA 4 TIMES A DAY (Patient taking differently: Apply 2 g topically 4 (four) times daily. ), Disp: 100 g, Rfl: 1 .  diltiazem (CARDIZEM CD) 240 MG 24 hr capsule, Take 1 capsule (240 mg total) by mouth daily., Disp: 90 capsule, Rfl: 0 .  ferrous sulfate 325 (65 FE) MG tablet, Take 325 mg by mouth 3 (three) times daily with meals. , Disp: , Rfl:  .  fluticasone (FLONASE) 50 MCG/ACT nasal spray, Place 2 sprays into both nostrils at bedtime., Disp: 16 g, Rfl: 3 .  hydrochlorothiazide (MICROZIDE) 12.5 MG capsule, Take 1 capsule (12.5 mg total) by mouth daily., Disp: 90 capsule, Rfl: 0 .  hydrocortisone 2.5 % ointment, APPLY TO AFFECTED AREA TWICE DAILY MONDAY THRU FRIDAY ONLY, Disp: , Rfl:  .  ipratropium (ATROVENT) 0.06 % nasal spray, PLACE 2 SPRAYS INTO BOTH NOSTRILS 4 (FOUR) TIMES DAILY., Disp: 45 mL, Rfl: 2 .  ketoconazole (NIZORAL) 2 % shampoo, APPLY TO AFFECTED AREA TWICE A WEEK AS DIRECTED, Disp: 120 mL, Rfl: 5 .  latanoprost (XALATAN) 0.005 % ophthalmic solution, Place 1 drop into both eyes at bedtime. , Disp: , Rfl:  .  loratadine (CLARITIN) 10 MG tablet, Take 1 tablet (10  mg total) by mouth daily., Disp: 90 tablet, Rfl: 1 .  metFORMIN (GLUCOPHAGE) 1000 MG tablet, TAKE 1 TABLET TWICE A DAY WITH A MEAL, Disp: 180 tablet, Rfl: 0 .  mometasone (ELOCON) 0.1 % ointment, APPLY A SMALL AMOUNT TO AFFECTED AREA AS DIRECTED, Disp: , Rfl:  .  omeprazole (PRILOSEC) 20 MG capsule, TAKE 1 CAPSULE (20 MG TOTAL) BY MOUTH 2 (TWO) TIMES DAILY., Disp: 180 capsule, Rfl: 1 .  pravastatin (PRAVACHOL) 40 MG tablet, TAKE 1 TABLET BY MOUTH EVERY DAY IN THE EVENING, Disp: 90 tablet, Rfl: 1 .  sulfamethoxazole-trimethoprim (BACTRIM DS) 800-160 MG tablet, TAKE 1 TABLET BY MOUTH TWICE A DAY FOR 5 DAYS, Disp: , Rfl:  .  Vitamin D, Cholecalciferol, 25 MCG (1000 UT) CAPS, Take 1,000 Units by mouth daily., Disp: , Rfl:  .  Vitamins-Lipotropics ([employer] INNER EAR PLUS PO), Take 2 tablets by mouth 2  (two) times daily. , Disp: , Rfl:   Social History   Tobacco Use  Smoking Status Never Smoker  Smokeless Tobacco Never Used    Allergies  Allergen Reactions  . Ace Inhibitors Cough  . Amoxicillin-Pot Clavulanate Diarrhea  . Augmentin [Amoxicillin-Pot Clavulanate] Other (See Comments)    diarrhea  . Azithromycin Diarrhea  . Ceftin [Cefuroxime Axetil]     diarrhea  . Cefuroxime Diarrhea and Other (See Comments)    Generalized weakness  . Cyclobenzaprine Hcl Diarrhea  . Flexeril [Cyclobenzaprine] Diarrhea  . Flonase [Fluticasone Propionate] Other (See Comments)    Nose bleed  . Ibuprofen Nausea And Vomiting   Objective:  There were no vitals filed for this visit. There is no height or weight on file to calculate BMI. Constitutional Well developed. Well nourished.  Vascular Dorsalis pedis pulses palpable bilaterally. Posterior tibial pulses palpable bilaterally. Capillary refill normal to all digits.  No cyanosis or clubbing noted. Pedal hair growth normal.  Neurologic Normal speech. Oriented to person, place, and time. Epicritic sensation to light touch grossly present bilaterally.  Dermatologic Nails well groomed and normal in appearance. No open wounds. No skin lesions.  Orthopedic: No pain to palpation left dorsal midfoot.   Radiographs: None Assessment:   1. Arthritis of foot, left    Plan:  Patient was evaluated and treated and all questions answered.  Midfoot arthritis left -No pain today -Continue voltaren gel -F/u PRN for injection should pain reoccur.  No follow-ups on file.

## 2018-09-06 ENCOUNTER — Telehealth: Payer: Self-pay

## 2018-09-06 NOTE — Telephone Encounter (Signed)
Copied from [place] [id number]. Topic: General - Other >> Sep 06, 2018 12:30 PM Leward Quan A wrote: Reason for CRM: Patient called to say that her attorneys office will be sending papers in the mail so that Dr Volanda Napoleon can complete so that the patient can possibly get her driving privileges back. Patient just want to make Dr Volanda Napoleon aware that these papers are coming in the mail. Patient is asking for Dr Volanda Napoleon to complete the paper work in a timely manner so she can be able to drive again since she have no one to depend on to get her around.

## 2018-09-06 NOTE — Telephone Encounter (Signed)
FYI

## 2018-09-07 ENCOUNTER — Other Ambulatory Visit: Payer: Self-pay

## 2018-09-07 ENCOUNTER — Encounter (INDEPENDENT_AMBULATORY_CARE_PROVIDER_SITE_OTHER): Payer: Medicare Other | Admitting: Ophthalmology

## 2018-09-07 DIAGNOSIS — H43813 Vitreous degeneration, bilateral: Secondary | ICD-10-CM | POA: Diagnosis not present

## 2018-09-07 DIAGNOSIS — H33302 Unspecified retinal break, left eye: Secondary | ICD-10-CM

## 2018-09-07 DIAGNOSIS — H35033 Hypertensive retinopathy, bilateral: Secondary | ICD-10-CM

## 2018-09-07 DIAGNOSIS — H34832 Tributary (branch) retinal vein occlusion, left eye, with macular edema: Secondary | ICD-10-CM

## 2018-09-07 DIAGNOSIS — I10 Essential (primary) hypertension: Secondary | ICD-10-CM | POA: Diagnosis not present

## 2018-09-08 NOTE — Telephone Encounter (Signed)
Pt called stating the papers were supposed to be faxed over on 09/06/2018. Pt states she needs this papers filled out and be faxed to the attorneys office. Pt is requesting an update. Please advise.   Attorney's # [phone] Fax # [phone]

## 2018-09-08 NOTE — Telephone Encounter (Signed)
Patient is calling to check the status of her paperwork that the doctor is supposed to fill out in order for her to get her drivers license.  Please advise and let the patient know the status of the forms to be completed.  CB# [phone]

## 2018-09-09 NOTE — Telephone Encounter (Signed)
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 She is hoping the dr will fill out these papers for her driving privileges.

## 2018-09-10 NOTE — Telephone Encounter (Signed)
Pt would like to know when this paperwork has been completed.

## 2018-09-10 NOTE — Telephone Encounter (Signed)
Pt form has been received and placed on Dr Volanda Napoleon desk for review.

## 2018-09-13 ENCOUNTER — Telehealth: Payer: Self-pay

## 2018-09-13 ENCOUNTER — Telehealth: Payer: Self-pay | Admitting: Certified Nurse Midwife

## 2018-09-13 ENCOUNTER — Telehealth: Payer: Self-pay | Admitting: Family Medicine

## 2018-09-13 NOTE — Telephone Encounter (Signed)
Copied from [place] [id number]. Topic: Appointment Scheduling - Scheduling Inquiry for Clinic >> Sep 13, 2018  7:43 AM Scherrie Gerlach wrote: Reason for CRM: PLEASE call pt and set up a virtual appt for today.  Pt has some burning with urination. Pt states she is swollen in her private parts on one side, but hurts in the inside the most.  Also, pt would like to discuss the paperwork situation.

## 2018-09-13 NOTE — Telephone Encounter (Signed)
Patient is calling regarding burning when urinating. Patient stated that there is swelling "inside." Patient stated that she does not currently have transportation.

## 2018-09-13 NOTE — Telephone Encounter (Signed)
Spoke with patient. Patient reports burning in vagina when urine touches the skin and swelling inside the vagina. Symptoms started this morning. Denies vaginal d/c, bleeding, urgency, frequency. Patient is requesting OV, expressed concerns about lack of transportation. Patient states transportation is not offered at the facility where she lives. Offered to assist patient with assistance with scheduling transportation thru [facility], patient declined. Advised patient you typically have to schedule [facility] appt for transportation and this may need to be done in advance. Patient declined OV with Melvia Heaps, CNM. Patient states she needs to discuss with her secretary and return call. Provided patient with [facility] contact number [phone] and my name to return call.

## 2018-09-13 NOTE — Telephone Encounter (Signed)
Spoke with pt states that she has no need to see Dr Volanda Napoleon for any medical issues, pt states that she was following up on her DMV paperwork that was faxed to the office to be completed, pt verbalized understanding that she needs to have her Eye specialist complete the form, Requested for the forms be mailed out to her home address on file. Forms have been mailed out.

## 2018-09-13 NOTE — Telephone Encounter (Signed)
  Spoke with pt states that she has no need to see Dr Volanda Napoleon for any medical issues, pt states that she was following up on her DMV paperwork that was faxed to the office to be completed, pt verbalized understanding that she needs to have her Eye specialist complete the form, Requested for the forms be mailed out to her home address on file. Forms have been mailed out.    Copied from [place] [id number]. Topic: Appointment Scheduling - Scheduling Inquiry for Clinic >> Sep 13, 2018  7:43 AM Scherrie Gerlach wrote: Reason for CRM: PLEASE call pt and set up a virtual appt for today.  Pt has some burning with urination. Pt states she is swollen in her private parts on one side, but hurts in the inside the most.  Also, pt would like to discuss the paperwork situation.

## 2018-09-13 NOTE — Telephone Encounter (Signed)
Pt calling again this am to find out if Dr Volanda Napoleon has sent her paperwork off yet.  Pt states she needs her driving license.

## 2018-09-13 NOTE — Telephone Encounter (Signed)
Copied from [place] [id number]. Topic: Appointment Scheduling - Scheduling Inquiry for Clinic >> Sep 13, 2018  7:43 AM Scherrie Gerlach wrote: Reason for CRM: PLEASE call pt and set up a virtual appt for today.  Pt has some burning with urination. Pt states she is swollen in her private parts on one side, but hurts in the inside the most.  Also, pt would like to discuss the paperwork situation.   Discussed with Izora Gala about the [facility] paperwork and she requested for me to send the CRM to her to complete.

## 2018-09-13 NOTE — Telephone Encounter (Signed)
Spoke with patient. Patient states she has spoken with her Network engineer and is making arrangements for transportation. Patient request to schedule OV on 8/26 with Melvia Heaps, CNM. OV scheduled for 8/26 at 11:30am with Melvia Heaps, CNM. Advised patient if symptoms worsen or new symptoms develop return call to office for earlier OV. Patient verbalizes understanding and is agreeable.   Routing to provider for final review. Patient is agreeable to disposition. Will close encounter.  Cc: Dr. Sabra Heck

## 2018-09-13 NOTE — Telephone Encounter (Signed)
See note

## 2018-09-13 NOTE — Telephone Encounter (Signed)
Pt DMV forms have been mailed out to her address on file

## 2018-09-14 NOTE — Progress Notes (Signed)
63 y.o. Single Caucasian female [name] here with complaint of vaginal symptoms of when urine touches skin and causes burning. No increase in discharge from vagina. Continues with coconut oil for vaginal dryness. Discharge is clear. Continues with Desitin use for rectal itching. Onset of symptoms 2-3 days ago. Denies new personal products or changed any of her toilet tissue.  Urinary symptoms none, not complaining of frequency or urgency. No incontinence. Still has assistance with cleaning her apartment, but washes her own clothes now.  [employer] and instrumentation engineer of lawyer who handles patient affairs) checks on her weekly. She still has some of other medications to use. Unable to drive now, no car. License revoked and working on changing this. Patient has eye issues now and seeing specialist. No other health issues today.  Review of Systems  Skin:       Vaginal irritation & burning when urine touches the skin    O:Healthy female WDWN Affect: normal, orientation x 3  Exam:Skin: warm and dry Abdomen:soft, non tender, no rashes, groin area dry and no redness  Inguinal Lymph nodes: no enlargement or tenderness Pelvic exam: External genital: normal female, vulva slight increase redness on right, sarcoidosis site, no change in size, still approximately 5 cm in size, soft, non tender BUS: negative Vagina: scant discharge noted. Affirm taken Cervix/uterus/adnexal absent:, non tender, no masses or fullness noted. Rectal area no redness or irritation noted  Physical Exam Genitourinary:     A:Normal pelvic exam R/O vaginal infection Vulva irritation, suspect moisture related with depends use Mentally challenged   P:Discussed findings of redness and shown to patient in mirror. Discussed using blow dryer to dry in groin and vulva area after bathing to decrease moisture issues.  Instructed to obtain if she does not have her Clotrimazole cream and use to affected areas twice daily for 5 days and should  resolve. Printed information given to patient. Lab: affirm  RV prn

## 2018-09-15 ENCOUNTER — Ambulatory Visit (INDEPENDENT_AMBULATORY_CARE_PROVIDER_SITE_OTHER): Payer: Medicare Other | Admitting: Certified Nurse Midwife

## 2018-09-15 ENCOUNTER — Encounter: Payer: Self-pay | Admitting: Certified Nurse Midwife

## 2018-09-15 ENCOUNTER — Other Ambulatory Visit: Payer: Self-pay

## 2018-09-15 VITALS — BP 118/70 | HR 68 | Temp 97.3°F | Resp 16 | Wt 178.0 lb

## 2018-09-15 DIAGNOSIS — N763 Subacute and chronic vulvitis: Secondary | ICD-10-CM

## 2018-09-15 DIAGNOSIS — N951 Menopausal and female climacteric states: Secondary | ICD-10-CM

## 2018-09-15 NOTE — Patient Instructions (Signed)
Check to see if you have Clotrimazole cream, if not purchase at drug store, does not need prescription. Use small amount to vaginal lip area twice daily for 5 days. Use coconut oil as you do normally.  This should stop the burning.

## 2018-09-16 LAB — VAGINITIS/VAGINOSIS, DNA PROBE
Candida Species: NEGATIVE
Gardnerella vaginalis: NEGATIVE
Trichomonas vaginosis: NEGATIVE

## 2018-09-19 ENCOUNTER — Other Ambulatory Visit: Payer: Self-pay | Admitting: Family Medicine

## 2018-09-20 ENCOUNTER — Telehealth: Payer: Medicare Other | Admitting: Family Medicine

## 2018-09-20 ENCOUNTER — Telehealth: Payer: Self-pay | Admitting: Family Medicine

## 2018-09-20 ENCOUNTER — Other Ambulatory Visit: Payer: Self-pay

## 2018-09-20 ENCOUNTER — Telehealth: Payer: Self-pay | Admitting: Certified Nurse Midwife

## 2018-09-20 DIAGNOSIS — Z23 Encounter for immunization: Secondary | ICD-10-CM | POA: Diagnosis not present

## 2018-09-20 NOTE — Telephone Encounter (Signed)
Patient is calling to request DMV forms to be filled out by Melvia Heaps, CNM.

## 2018-09-20 NOTE — Telephone Encounter (Signed)
Copied from [place] [id number]. Topic: General - Other >> Sep 20, 2018  1:46 PM Pauline Good wrote: Reason for CRM: pt is calling and stating she need Dr Volanda Napoleon to fill these forms out for her to get her license back. Pt stated she called Dr Melvia Heaps and she stated she couldn't fill them out and call her pcp. Please call pt to advise. >> Sep 20, 2018  4:32 PM Rainey Pines A wrote: Patient would like a callback from Dr .Volanda Napoleon nurse in regards to paperwork being filled out and  to know if she needs to arrange transportation if she is required to come into office.

## 2018-09-20 NOTE — Telephone Encounter (Signed)
Call returned to patient. Phone rang and rang, no answer.    Call reviewed with Melvia Heaps, CNM. Patient will need to f/u with PCP and/or Eye doctor for further evaluation and completion of requested DMV forms.

## 2018-09-20 NOTE — Telephone Encounter (Signed)
Spoke with patient, advised per Melvia Heaps, CNM. Patient will f/u with her PCP. Questions answered.   Routing to provider for final review. Patient is agreeable to disposition. Will close encounter.

## 2018-09-20 NOTE — Telephone Encounter (Signed)
Copied from [place] [id number]. Topic: General - Other >> Sep 20, 2018  1:46 PM Pauline Good wrote: Reason for CRM: pt is calling and stating she need Dr Volanda Napoleon to fill these forms out for her to get her license back. Pt stated she called Dr Melvia Heaps and she stated she couldn't fill them out and call her pcp. Please call pt to advise.

## 2018-09-20 NOTE — Telephone Encounter (Signed)
Please Advise

## 2018-09-20 NOTE — Telephone Encounter (Signed)
This matter has been addressed several times.  Army Chaco, CMA spoke with pt this am advising her on this same subject.  Pt advised at her initial TOC visit that her previous pcp would be the appropriate person to complete the forms, however pt's previous pcp declined.  Upon reviewing the forms, pt's specialist would need to complete them.  As it stands, per chart review, this provider would not recommend pt drive.  No in person visit needed in regards to this matter.

## 2018-09-20 NOTE — Telephone Encounter (Signed)
Copied from [place] [phone]. Topic: General - Other >> Sep 20, 2018  1:46 PM Pauline Good wrote: Reason for CRM: pt is calling and stating she need Dr Volanda Napoleon to fill these forms out for her to get her license back. Pt stated she called Dr Melvia Heaps and she stated she couldn't fill them out and call her pcp. Please call pt to advise. >> Sep 20, 2018  4:32 PM Rainey Pines A wrote: Patient would like a callback from Dr .Volanda Napoleon nurse in regards to paperwork being filled out and  to know if she needs to arrange transportation if she is required to come into office.

## 2018-09-21 NOTE — Telephone Encounter (Signed)
Pt stated she needs to know what specialist she needs to contact to complete the forms for her. Please advise

## 2018-09-22 ENCOUNTER — Telehealth: Payer: Self-pay | Admitting: Certified Nurse Midwife

## 2018-09-22 NOTE — Telephone Encounter (Signed)
Spoke with patient. Patient states she has found some Anti-fungal powders that she was not aware that she had, they were misplaced. Has completed clotrimazole cream as recommended. Symptoms have resolved, wanted to make sure she had some on hand for "moisture and sweat".   Patient expressed concerns about being able to get her drivers license back. Has contacted her PCP and waiting for recommendations on who she should f/u with. Is working in getting a debit card. Her "Network engineer" picks up her groceries and other supplies needed. Patient is aware she needs to contact her "secretary" if any OTC medications are needed.   Questions answered. No f/u needed at this time.   Routing to provider for final review. Patient is agreeable to disposition. Will close encounter.

## 2018-09-22 NOTE — Telephone Encounter (Signed)
Patient would like Linda Thompson to call so her secretary can speak with [name] about having someone pick up medication for her.

## 2018-09-23 ENCOUNTER — Other Ambulatory Visit: Payer: Self-pay | Admitting: Family Medicine

## 2018-09-24 NOTE — Telephone Encounter (Signed)
Per Dr Volanda Napoleon this subject was discussed with pt and pt is aware of what she needs to do.

## 2018-09-27 NOTE — Telephone Encounter (Signed)
63 yo G0 SWF who called with complaint of burning her skin with a hairdryer.  She has issues with chronic vulvitis and burning when urine touches the skin.  She has been seen multiple times over the past several years for this issue.  She has some trouble staying dry so she was advised to use a hair dryer to help the skin dry.  She's been using a hair dryer on high and she thinks she burned her skin with the hair dryer.  Skin is tender to the touch.  It is not blistered and there is no skin peeling.  There is a little burning with urination but it doesn't feel like a UTI.  She wants to know what is safe to put on the skin as it feels tender.  Advised she could use coconut oil or vaseline.  She knows to only use the vaseline externally.  Pt does not feel she needs to be seen but advised her if she was not improving in 24-48 hours, she needs to be seen.  Voices clear understanding.

## 2018-09-28 ENCOUNTER — Telehealth: Payer: Self-pay | Admitting: Certified Nurse Midwife

## 2018-09-28 NOTE — Telephone Encounter (Signed)
Agreeable, she will call if needed earlier

## 2018-09-28 NOTE — Telephone Encounter (Signed)
Spoke with patient. Patient reports vaginal itching and possible scratch on labia/vulvar area, started on 9/7. Patient uses coconut oil daily for itching and uses a hair dryer to keep vaginal area dry. Denies vaginal d/c, odor or urinary symptoms. Patient states she called on call provider last night and was advised to stop using hair dryer, apply coconut oil daily for 1 wk, if symptoms do not resolve return call to office for OV. Patient is requesting OV for 9/22. Patient declines earlier OV, "has other appointments". OV scheduled for 9/22 at 11am with Melvia Heaps, CNM. Patient is aware to return call if symptoms worsen or new symptoms develop.   Routing to provider for final review. Patient is agreeable to disposition. Will close encounter.

## 2018-09-28 NOTE — Telephone Encounter (Signed)
Patient spoke to the on call physician last night regarding itching after urination. She want to speak with nurse to tell her what she was told .

## 2018-09-29 ENCOUNTER — Telehealth: Payer: Self-pay | Admitting: Certified Nurse Midwife

## 2018-09-29 ENCOUNTER — Ambulatory Visit: Payer: Self-pay | Admitting: Certified Nurse Midwife

## 2018-09-29 NOTE — Telephone Encounter (Signed)
Please advise 

## 2018-09-29 NOTE — Telephone Encounter (Signed)
Patient would like to schedule an appointment for Friday.

## 2018-09-29 NOTE — Telephone Encounter (Signed)
Spoke with patient. Denies any new or worsening symptoms, requesting earlier OV. Patient request to schedule on 9/11 for vaginal itching. OV scheduled for 9/11 at 11:30am with Melvia Heaps, CNM.  Cancelled OV on 9/22.   Routing to provider for final review. Patient is agreeable to disposition. Will close encounter.

## 2018-10-01 ENCOUNTER — Encounter: Payer: Self-pay | Admitting: Certified Nurse Midwife

## 2018-10-01 ENCOUNTER — Ambulatory Visit (INDEPENDENT_AMBULATORY_CARE_PROVIDER_SITE_OTHER): Payer: Medicare Other | Admitting: Certified Nurse Midwife

## 2018-10-01 ENCOUNTER — Telehealth: Payer: Self-pay

## 2018-10-01 ENCOUNTER — Other Ambulatory Visit: Payer: Self-pay

## 2018-10-01 VITALS — BP 116/70 | HR 68 | Temp 97.1°F | Resp 16 | Wt 179.0 lb

## 2018-10-01 DIAGNOSIS — N898 Other specified noninflammatory disorders of vagina: Secondary | ICD-10-CM

## 2018-10-01 DIAGNOSIS — B372 Candidiasis of skin and nail: Secondary | ICD-10-CM

## 2018-10-01 NOTE — Telephone Encounter (Signed)
  This encounter has already been resolved   Copied from [place] [id number]. Topic: General - Other >> Sep 20, 2018  1:46 PM Pauline Good wrote: Reason for CRM: pt is calling and stating she need Dr Volanda Napoleon to fill these forms out for her to get her license back. Pt stated she called Dr Melvia Heaps and she stated she couldn't fill them out and call her pcp. Please call pt to advise. >> Sep 20, 2018  4:32 PM Rainey Pines A wrote: Patient would like a callback from Dr .Volanda Napoleon nurse in regards to paperwork being filled out and  to know if she needs to arrange transportation if she is required to come into office.

## 2018-10-01 NOTE — Progress Notes (Signed)
63 y.o. Single Caucasian female [name] here with complaint of vaginal symptoms of itching, on right side only. Denies any burning, or increase discharge. Had been using hairdryer to dry in vaginal area to make sure to keep dry. Still wearing depends when she is out if urine leakage, which does occur at times. Onset of symptoms 7  days ago. Denies new personal products. She also has some redness on skin under both breasts as she has had before. Brought her medications with her. Denies any nipple discharge or change. Also brought update of her attorney and Network engineer who helps her with her personal care. She does not have drivers license and attorney working on this for her. No change in diabetic status that she aware of. No other health issues today.  Review of Systems  Constitutional: Negative.   HENT: Negative.   Eyes: Negative.   Respiratory: Negative.   Cardiovascular: Negative.   Gastrointestinal: Negative.   Genitourinary: Negative.   Musculoskeletal: Negative.   Skin:       Vaginal itching  Neurological: Negative.   Endo/Heme/Allergies: Negative.   Psychiatric/Behavioral: Negative.     O:Healthy female WDWN Affect: normal, orientation x 3  Exam: Skin warm and dry Breasts: Skin under both breast redness with slight macular appearance, no weeping. Abdomen: Soft, non tender Inguinal Lymph nodes: no enlargement or tenderness Pelvic exam: External genital: normal female appearance, atrophic appearance right vulva larger than left due to history of sarcoid primary site. Palpates 5 cm which is no change.  BUS: negative Vagina: scant white  discharge noted. Affirm taken, small tiny red area noted on right side, patient acknowledges that this is the area that itches. Appears to be red from scratching only, no lesion or exudate. Cervix/Uterus absent Adnexa: non tender, no masses or fullness noted    A:Normal pelvic exam R/O vaginal infection, suspect dryness only Perspiration rash  under breasts History of sarcoidosis right vulva primary site,no size change Mentally challenged   P:Discussed findings of vaginal area that is itching. Instructed to use her 1 % hydrocortisone ointment to area twice daily for five days. Written note on medication given. Instructed to use Antifungal powder under both breasts after shower and dry well. Wash bras as needed. Questions addressed at length. Lab: affirm Updated attorney information copied and will be placed in chart.  Rv prn

## 2018-10-01 NOTE — Patient Instructions (Signed)
Apply Hydrocortisone ointment twice daily inside vaginal lip area on right for 5 days. Apply antifungal powder under breast after shower.

## 2018-10-02 LAB — VAGINITIS/VAGINOSIS, DNA PROBE
Candida Species: NEGATIVE
Gardnerella vaginalis: NEGATIVE
Trichomonas vaginosis: NEGATIVE

## 2018-10-05 ENCOUNTER — Encounter (INDEPENDENT_AMBULATORY_CARE_PROVIDER_SITE_OTHER): Payer: Medicare Other | Admitting: Ophthalmology

## 2018-10-05 ENCOUNTER — Other Ambulatory Visit: Payer: Self-pay

## 2018-10-05 DIAGNOSIS — H43813 Vitreous degeneration, bilateral: Secondary | ICD-10-CM | POA: Diagnosis not present

## 2018-10-05 DIAGNOSIS — H33302 Unspecified retinal break, left eye: Secondary | ICD-10-CM

## 2018-10-05 DIAGNOSIS — H34832 Tributary (branch) retinal vein occlusion, left eye, with macular edema: Secondary | ICD-10-CM

## 2018-10-05 DIAGNOSIS — I10 Essential (primary) hypertension: Secondary | ICD-10-CM

## 2018-10-05 DIAGNOSIS — H35033 Hypertensive retinopathy, bilateral: Secondary | ICD-10-CM

## 2018-10-08 NOTE — Telephone Encounter (Signed)
Spoke with pt states that she has no issue with her bladder, offered pt an office visit but stated that she has no problems that need office visit. Verbalized understanding

## 2018-10-10 ENCOUNTER — Other Ambulatory Visit: Payer: Self-pay | Admitting: Family Medicine

## 2018-10-12 ENCOUNTER — Ambulatory Visit: Payer: Self-pay | Admitting: Certified Nurse Midwife

## 2018-10-13 ENCOUNTER — Other Ambulatory Visit: Payer: Self-pay

## 2018-10-13 ENCOUNTER — Encounter: Payer: Self-pay | Admitting: Podiatry

## 2018-10-13 ENCOUNTER — Ambulatory Visit (INDEPENDENT_AMBULATORY_CARE_PROVIDER_SITE_OTHER): Payer: Medicare Other | Admitting: Podiatry

## 2018-10-13 DIAGNOSIS — B351 Tinea unguium: Secondary | ICD-10-CM

## 2018-10-13 DIAGNOSIS — M79674 Pain in right toe(s): Secondary | ICD-10-CM

## 2018-10-13 DIAGNOSIS — M79675 Pain in left toe(s): Secondary | ICD-10-CM | POA: Diagnosis not present

## 2018-10-13 DIAGNOSIS — E119 Type 2 diabetes mellitus without complications: Secondary | ICD-10-CM

## 2018-10-13 NOTE — Progress Notes (Signed)
Patient ID: Linda Thompson, female   DOB: 1955/11/02, 64 y.o.   MRN: [id number] Complaint:  Visit Type: Patient returns to my office for continued preventative foot care services. Complaint: Patient states" my nails have grown long and thick and become painful to walk and wear shoes" Patient has been diagnosed with DM with no foot complications. The patient presents for preventative foot care services. No changes to ROS.    Podiatric Exam: Vascular: dorsalis pedis and posterior tibial pulses are palpable bilateral. Capillary return is immediate. Temperature gradient is WNL. Skin turgor WNL  Sensorium: Normal Semmes Weinstein monofilament test. Normal tactile sensation bilaterally. Nail Exam: Pt has thick disfigured discolored nails with subungual debris noted bilateral entire nail hallux through fifth toenails Ulcer Exam: There is no evidence of ulcer or pre-ulcerative changes or infection. Orthopedic Exam: Muscle tone and strength are WNL. No limitations in general ROM. No crepitus or effusions noted. Foot type and digits show no abnormalities. Bony prominences are unremarkable.  Skin: No Porokeratosis. No infection or ulcers  Diagnosis:  Onychomycosis, , Pain in right toe, pain in left toes  Treatment & Plan Procedures and Treatment: Consent by patient was obtained for treatment procedures. The patient understood the discussion of treatment and procedures well. All questions were answered thoroughly reviewed. Debridement of mycotic and hypertrophic toenails, 1 through 5 bilateral and clearing of subungual debris. No ulceration, no infection noted. Return Visit-Office Procedure: Patient instructed to return to the office for a follow up visit 3 months for continued evaluation and treatment.   Gardiner Barefoot DPM

## 2018-10-15 ENCOUNTER — Other Ambulatory Visit: Payer: Self-pay | Admitting: Family Medicine

## 2018-10-19 ENCOUNTER — Encounter: Payer: Self-pay | Admitting: Gynecology

## 2018-10-20 ENCOUNTER — Ambulatory Visit: Payer: Self-pay

## 2018-10-20 ENCOUNTER — Encounter: Payer: Self-pay | Admitting: Certified Nurse Midwife

## 2018-10-20 DIAGNOSIS — Z1231 Encounter for screening mammogram for malignant neoplasm of breast: Secondary | ICD-10-CM | POA: Diagnosis not present

## 2018-10-20 LAB — HM MAMMOGRAPHY

## 2018-10-20 NOTE — Telephone Encounter (Signed)
Message from Linda Thompson sent at 10/20/2018 8:05 AM EDT  Summary: medication reaction    Pt took her blood pressure meds and then ate breakfast and soon after she took other medications and started to cough and felt like she had to throw up. Pt stated some of her breakfast came up and she was advised to call the Dr. / please call Pt to get more information. / Pt has a mammogram appt today at 3:00. And wont be available after this time/ please advise          Ret'd call to pt.  Reported she had an episode of coughing following swallowing her medication this morning, and felt like she could vomit.  Stated she spit up a small amt. of food and phlegm x 1.  Denied any further nausea or vomiting.  Denied fever/ chills.  Stated her bowel movements are regular.  Denied any abdominal discomfort.  Denied any further cough.  Reported she has a hiatal hernia.  Advised on importance of drinking full glass of water with her medications, and to avoid laying down for approx. 30 min., after ingesting her medications or food.  Encouraged to monitor for any worsening of symptoms and call back if needed.  Verb understanding.  Agreed with plan.    Reason for Disposition . Unexplained nausea    Reported one episode of coughing after taking medication, and started to feel she needed to vomit.  Reported she vomited small amt. Of food and phlegm x 1.  Denied any further nausea or vomiting.  Answer Assessment - Initial Assessment Questions 1. NAUSEA SEVERITY: "How bad is the nausea?" (e.g., mild, moderate, severe; dehydration, weight loss)   - MILD: loss of appetite without change in eating habits   - MODERATE: decreased oral intake without significant weight loss, dehydration, or malnutrition   - SEVERE: inadequate caloric or fluid intake, significant weight loss, symptoms of dehydration    Denied nausea at present 2. ONSET: "When did the nausea begin?"     Reported one episode of spitting up small amt. of food and  phlegm this morning with coughing after she took her medications, approx. 8:00 AM.  3. VOMITING: "Any vomiting?" If so, ask: "How many times today?"     Coughed and spit up some phlegm 4. RECURRENT SYMPTOM: "Have you had nausea before?" If so, ask: "When was the last time?" "What happened that time?"    Denied previous episode 5. CAUSE: "What do you think is causing the nausea?"     Unknown; had sudden onset of coughing and felt like she could vomit this morning approx.  6. PREGNANCY: "Is there any chance you are pregnant?" (e.g., unprotected intercourse, missed birth control pill, broken condom)     n/a  Protocols used: NAUSEA-A-AH

## 2018-10-21 ENCOUNTER — Telehealth (INDEPENDENT_AMBULATORY_CARE_PROVIDER_SITE_OTHER): Payer: Medicare Other | Admitting: Family Medicine

## 2018-10-21 ENCOUNTER — Encounter: Payer: Self-pay | Admitting: Family Medicine

## 2018-10-21 ENCOUNTER — Ambulatory Visit: Payer: Self-pay | Admitting: *Deleted

## 2018-10-21 ENCOUNTER — Other Ambulatory Visit: Payer: Self-pay

## 2018-10-21 DIAGNOSIS — R112 Nausea with vomiting, unspecified: Secondary | ICD-10-CM | POA: Diagnosis not present

## 2018-10-21 NOTE — Telephone Encounter (Signed)
Pt scheduled for virtual visit today.  

## 2018-10-21 NOTE — Telephone Encounter (Signed)
Per initial encounter, "Patient stated she is trying to follow the instructions that was given her yesterday by the triage nurse but she doesn't want anything to eat and her stomach is hurting"; she says that she does not have an appetite and her stomach is burning; she has been drinking water; this started after she took her metformin and iron on 10/20/2018; she had no n/v today; recommendations made per nurse triage protocol; she verbalized understanding; the pt sees [facility], [facility]; pt transferred to [facility] for scheduling..  Reason for Disposition . [1] MODERATE pain (e.g., interferes with normal activities) AND [2] pain comes and goes (cramps) AND [3] present > 24 hours  (Exception: pain with Vomiting or Diarrhea - see that Guideline)  Answer Assessment - Initial Assessment Questions 1. LOCATION: "Where does it hurt?"      Top part of stomach 2. RADIATION: "Does the pain shoot anywhere else?" (e.g., chest, back)     no 3. ONSET: "When did the pain begin?" (e.g., minutes, hours or days ago)    10/20/2018. 4. SUDDEN: "Gradual or sudden onset?"     sudden 5. PATTERN "Does the pain come and go, or is it constant?"    - If constant: "Is it getting better, staying the same, or worsening?"      (Note: Constant means the pain never goes away completely; most serious pain is constant and it progresses)     - If intermittent: "How long does it last?" "Do you have pain now?"     (Note: Intermittent means the pain goes away completely between bouts)     intermittent 6. SEVERITY: "How bad is the pain?"  (e.g., Scale 1-10; mild, moderate, or severe)   - MILD (1-3): doesn't interfere with normal activities, abdomen soft and not tender to touch    - MODERATE (4-7): interferes with normal activities or awakens from sleep, tender to touch    - SEVERE (8-10): excruciating pain, doubled over, unable to do any normal activities      mild 7. RECURRENT SYMPTOM: "Have you ever had this type of  abdominal pain before?" If so, ask: "When was the last time?" and "What happened that time?"      no 8. CAUSE: "What do you think is causing the abdominal pain?"     Not sure 9. RELIEVING/AGGRAVATING FACTORS: "What makes it better or worse?" (e.g., movement, antacids, bowel movement)     nothing 10. OTHER SYMPTOMS: "Has there been any vomiting, diarrhea, constipation, or urine problems?"       Vomiting on 10/20/2018 x 1 11. PREGNANCY: "Is there any chance you are pregnant?" "When was your last menstrual period?"      no  Protocols used: ABDOMINAL PAIN - [facility]

## 2018-10-21 NOTE — Progress Notes (Signed)
Virtual Visit via Telephone Note  I connected with Linda Thompson on 10/21/18 at  1:20 PM EDT by telephone and verified that I am speaking with the correct person using two identifiers.   I discussed the limitations, risks, security and privacy concerns of performing an evaluation and management service by telephone and the availability of in person appointments. I also discussed with the patient that there may be a patient responsible charge related to this service. The patient expressed understanding and agreed to proceed.  Location patient: home Location provider: work or home office Participants present for the call: patient, provider Patient did not have a visit in the prior 7 days to address this/these issue(s).   History of Present Illness:  Acute visit vomiting: -yesterday morning had some PND, cough and gagged when was coughing and threw up once -She had a little nausea and anorexia yesterday as felt like stomach was burning if she ate -denies any further vomiting since yesterday morning -denies diarrhea, abd pain today, fevers, hematochezia, melena, fatigue, cough, congestion   Observations/Objective: Patient sounds cheerful and well on the phone. I do not appreciate any SOB. Speech and thought processing are grossly intact. Patient reported vitals:  Assessment and Plan:  Emesis Nausea  Discussed potential etiologies, options for evaluation, risks, limitations of telemedicine vs in person visit, treatment options and return precautions. Query gag reflux from PND - she has longstanding AW with PND (seems no further resp symptoms so VURI less likely) vs mild stomach upset/gastritis. She has had no further emesis since yesterday and right now denies all symptoms currently. Opted for bland diet, avoid dairy, small meals for then next few days, oral hydration and close follow up or in person evaluation if worsening. She takes her omeprazole daily and will continue.  Follow Up  Instructions:  I did not refer this patient for an OV in the next 24 hours for this/these issue(s).  I discussed the assessment and treatment plan with the patient. The patient was provided an opportunity to ask questions and all were answered. The patient agreed with the plan and demonstrated an understanding of the instructions.   The patient was advised to call back or seek an in-person evaluation if the symptoms worsen or if the condition fails to improve as anticipated.  I provided 14 minutes of non-face-to-face time during this encounter.   [name], DO

## 2018-10-22 ENCOUNTER — Ambulatory Visit: Payer: Self-pay

## 2018-10-22 NOTE — Telephone Encounter (Signed)
Incoming call from Patient who states that she has  a "funny feeling. " in her chest. Reports nausea states the pain occurs when she breathes.   States during the call she believes that it is getting worse.   Rates it mild to moderate.  Reports Cardiac Hx in the family. Reviewed protocol with Pt. Recommended to Pt.  That she go to Ed or Urgent care for further evaluation.    Reason for Disposition . [1] Chest pain lasts > 5 minutes AND [2] age > 38  Answer Assessment - Initial Assessment Questions 1. LOCATION: "Where does it hurt?"       In the middle 2. RADIATION: "Does the pain go anywhere else?" (e.g., into neck, jaw, arms, back)     denies 3. ONSET: "When did the chest pain begin?" (Minutes, hours or days)     Earlier this morning 4. PATTERN "Does the pain come and go, or has it been constant since it started?"  "Does it get worse with exertion?"     When I breathe 5. DURATION: "How long does it last" (e.g., seconds, minutes, hours)     *No Answer* 6. SEVERITY: "How bad is the pain?"  (e.g., Scale 1-10; mild, moderate, or severe)    - MILD (1-3): doesn't interfere with normal activities     - MODERATE (4-7): interferes with normal activities or awakens from sleep    - SEVERE (8-10): excruciating pain, unable to do any normal activities       Mild to moderate  7. CARDIAC RISK FACTORS: "Do you have any history of heart problems or risk factors for heart disease?" (e.g., angina, prior heart attack; diabetes, high blood pressure, high cholesterol, smoker, or strong family history of heart disease)    yes 8. PULMONARY RISK FACTORS: "Do you have any history of lung disease?"  (e.g., blood clots in lung, asthma, emphysema, birth control pills)    Denies  9. CAUSE: "What do you think is causing the chest pain?"     *No Answer* 10. OTHER SYMPTOMS: "Do you have any other symptoms?" (e.g., dizziness, nausea, vomiting, sweating, fever, difficulty breathing, cough)       nausea 11. PREGNANCY: "Is  there any chance you are pregnant?" "When was your last menstrual period?"       na  Protocols used: CHEST PAIN-A-AH

## 2018-10-22 NOTE — Telephone Encounter (Signed)
FYI: Spoke with pt states that she had chest pain this morning and she called the Triage nurse at our office who  advised pt to call 911. Pt states that she called EMS and they came out to her home examined her and told her that her chest pain was caused by a hernia which she has been diagnosed with and is taking medications for. Pt states that EMS advised her to take the hernia medication since she did not want to go to the ED. Pt states that she has not has any chest pain since she took medication and EMS left her house. Advised pt to call 911 if she experience any chest pains.

## 2018-10-22 NOTE — Telephone Encounter (Signed)
Outgoing call to Pt.  To assess if EMS has come out. Patient states that they have.

## 2018-11-02 ENCOUNTER — Encounter: Payer: Self-pay | Admitting: Family Medicine

## 2018-11-02 ENCOUNTER — Other Ambulatory Visit: Payer: Self-pay

## 2018-11-02 ENCOUNTER — Telehealth (INDEPENDENT_AMBULATORY_CARE_PROVIDER_SITE_OTHER): Payer: Medicare Other | Admitting: Family Medicine

## 2018-11-02 DIAGNOSIS — R05 Cough: Secondary | ICD-10-CM | POA: Diagnosis not present

## 2018-11-02 NOTE — Progress Notes (Signed)
Virtual Visit via Telephone Note  I connected with Linda Thompson on 11/02/18 at 10:00 AM EDT by telephone and verified that I am speaking with the correct person using two identifiers.   I discussed the limitations, risks, security and privacy concerns of performing an evaluation and management service by telephone and the availability of in person appointments. I also discussed with the patient that there may be a patient responsible charge related to this service. The patient expressed understanding and agreed to proceed.  Location patient: home Location provider: work or home office Participants present for the call: patient, provider Patient did not have a visit in the prior 7 days to address this/these issue(s).   History of Present Illness:  Acute telephone call for  - she had a coughing fit this morning after eating grits and right after doing her nasal spray for her allergies (reports she sees ENT), coughed so hard that she gagged some - felt like she had a tickle in her throat -had another coughing fit a few minutes later -she had a little bit of a runny nose -she hasn't really had any issues since or before -reports she actually felt fine yesterday and feels fine now -she did not feel that she choked on her food or her pills -denies SOB, wheezing, fevers, bodyaches, malaise, chills     Observations/Objective: Patient sounds cheerful and well on the phone. I do not appreciate any SOB. Speech and thought processing are grossly intact. Patient reported vitals:  Assessment and Plan:  Cough  -we discussed possible serious and likely etiologies, options for evaluation and workup, limitations of telemedicine visit vs in person visit, treatment, treatment risks and precautions. Pt prefers to treat via telemedicine empirically rather then risking or undertaking an in person visit at this moment. She seems fine currently. However, this is the second time she has complained of  this in the last few weeks. She feels is related to her nasal spray dripping in her throat. This is very possible. Advised journal of symptoms for a few weeks and follow up with PCP. Advised she see ENT if any worsening or recurrent symptoms in the interim. Patient agrees to seek prompt in person care if worsening, new symptoms arise, or if is not improving with treatment.  Follow Up Instructions:  Follow up with PCP in about 2-4 weeks.  I did not refer this patient for an OV in the next 24 hours for this/these issue(s).  I discussed the assessment and treatment plan with the patient. The patient was provided an opportunity to ask questions and all were answered. The patient agreed with the plan and demonstrated an understanding of the instructions.   The patient was advised to call back or seek an in-person evaluation if the symptoms worsen or if the condition fails to improve as anticipated.  I provided 15 minutes of non-face-to-face time during this encounter.   [name], DO

## 2018-11-02 NOTE — Patient Instructions (Signed)
Keep a journal of symptoms. This may have been related to the grits, the nasal spray or something else.   Please keep a journal of these symptoms. Please follow up with Dr. Volanda Napoleon in a few weeks. Please follow up or see your Ear, Nose and Throat specialist if this continues, worsens or new concerns arise in the interim.

## 2018-11-03 ENCOUNTER — Other Ambulatory Visit: Payer: Self-pay

## 2018-11-03 ENCOUNTER — Encounter (INDEPENDENT_AMBULATORY_CARE_PROVIDER_SITE_OTHER): Payer: Medicare Other | Admitting: Ophthalmology

## 2018-11-03 DIAGNOSIS — H34832 Tributary (branch) retinal vein occlusion, left eye, with macular edema: Secondary | ICD-10-CM | POA: Diagnosis not present

## 2018-11-03 DIAGNOSIS — I10 Essential (primary) hypertension: Secondary | ICD-10-CM

## 2018-11-03 DIAGNOSIS — H35033 Hypertensive retinopathy, bilateral: Secondary | ICD-10-CM | POA: Diagnosis not present

## 2018-11-03 DIAGNOSIS — H43813 Vitreous degeneration, bilateral: Secondary | ICD-10-CM | POA: Diagnosis not present

## 2018-11-05 ENCOUNTER — Telehealth: Payer: Self-pay | Admitting: *Deleted

## 2018-11-05 ENCOUNTER — Ambulatory Visit: Payer: Self-pay | Admitting: *Deleted

## 2018-11-05 NOTE — Telephone Encounter (Signed)
LVM on Pt to return my call in the office

## 2018-11-05 NOTE — Telephone Encounter (Signed)
Patient called after hours line this morning. Patient reports  She has lost some weight. Unsure of weight now unable to check her weight. Decreased in appetite for about a month now. Clothes are loose on her.

## 2018-11-05 NOTE — Telephone Encounter (Signed)
I returned pt's call.   After several minutes of listening to her talk that her main problem seems to be she is loosing weight and "the frozen stuff doesn't taste right".  She went on about using her stove, using her microwave, etc.   I had to keep getting her back on track with her conversation.  I warm transferred her call into the office to [facility] to be scheduled.    I saw the note at the beginning of her chart where it has been requested she be triaged before scheduling an appt.   No protocol available for weight lose so no protocol used.    I put in her conversation as she told her concerns to me.  I sent these notes to Dr. Carita Pian office.     Reason for Disposition . Nursing judgment or information in reference  Answer Assessment - Initial Assessment Questions 1. REASON FOR CALL: "What is your main concern right now?"     I'm loosing weight.   I'm trying to eat.   I'm a diabetic.   And I have a hiatal hernia.   Someone is getting groceries for me.   Nothing tastes the same and I don't have an appetite.      I was laying down and my stomach is burning.   Do you think that's making my stomach burn?       2. ONSET: "When did the a month ago start?"     I saw the doctor for a vaginal infection.    I weigh almost 200 lbs.   The doctor told me to lose weight because that's causing my infections. I am calling to find out, I do have some frozen stuff but it doesn't taste right.  I'm trying to eat but I'm loosing weight.   This has been going on for a "little while".      I asked her if she is trying to diet since doctor told her to loose weight and she replied,   "No".        The yeast infection is coming because I need to loose some weight. 3. SEVERITY: "How bad is the weight lose?"     I don't have a way to weigh myself.   The frozen stuff doesn't taste right.    I only use the stove to make my grits and my eggs.   I use my microwave too. I'm a diabetic so I'm trying to eat right.    I  change around and eat the canned stuff and the frozen stuff.   2 weeks ago on Wednesday I got up and fixed my breakfast and "I throwed up".     I went downstairs and they told me to call my doctor. Dr. Maudie Mercury said to eat some chicken soup for 2-3 days.   I don't want nothing to eat.   The frozen stuff they get doesn't taste good. 4. FEVER: "Do you have a fever?"     No 5. OTHER SYMPTOMS: "Do you have any other new symptoms?"     See above I called during the night last night.    I can't drive.    I don't want to go to the ER due to the virus (COVID-19).    The lady last night told me I did the right thing since it was not an emergency.  6. INTERVENTIONS AND RESPONSE: "What have you done so far to try to make this better? What medications  have you used?"     See above 7. PREGNANCY: "Is there any chance you are pregnant?"     N/A  Protocols used: NO GUIDELINE AVAILABLE-A-AH

## 2018-11-05 NOTE — Telephone Encounter (Signed)
Spoke with pt states that she is not coughing but has stomach problems, pt states that she has no way to come to the office since she has no transportation or a cell phone for a virtual visit. Pt states that she will keep the appointment for the 10/27 since she would like to have some labs drawn.

## 2018-11-05 NOTE — Telephone Encounter (Signed)
Patient returning call to [place]. Please advise.

## 2018-11-05 NOTE — Telephone Encounter (Signed)
Copied from [place] [phone]. Topic: General - Inquiry >> Nov 05, 2018  7:06 AM Rayann Heman wrote: Reason for CRM: pt called and stated that she would like to schedule an appointment for a cough and loss of taste. Pt states that she is also losing weight. >> Nov 05, 2018  7:44 AM Rayann Heman wrote: Pt calling back for 3rd time to let us know her stomach is now burning. Pt would like a call back as soon as possible.

## 2018-11-06 ENCOUNTER — Other Ambulatory Visit: Payer: Self-pay | Admitting: Family Medicine

## 2018-11-08 NOTE — Telephone Encounter (Signed)
Pt calling back and states that she would like to schedule an appointment today now if possible.

## 2018-11-09 NOTE — Telephone Encounter (Signed)
Spoke with pt states that she already has an appointment made with Dr Elease Hashimoto

## 2018-11-10 ENCOUNTER — Ambulatory Visit (INDEPENDENT_AMBULATORY_CARE_PROVIDER_SITE_OTHER): Payer: Medicare Other | Admitting: Family Medicine

## 2018-11-10 ENCOUNTER — Encounter: Payer: Self-pay | Admitting: Family Medicine

## 2018-11-10 ENCOUNTER — Other Ambulatory Visit: Payer: Self-pay

## 2018-11-10 VITALS — BP 122/98 | HR 102 | Temp 97.7°F | Ht 62.0 in | Wt 180.6 lb

## 2018-11-10 DIAGNOSIS — E1169 Type 2 diabetes mellitus with other specified complication: Secondary | ICD-10-CM

## 2018-11-10 DIAGNOSIS — E119 Type 2 diabetes mellitus without complications: Secondary | ICD-10-CM

## 2018-11-10 DIAGNOSIS — E785 Hyperlipidemia, unspecified: Secondary | ICD-10-CM | POA: Diagnosis not present

## 2018-11-10 DIAGNOSIS — R634 Abnormal weight loss: Secondary | ICD-10-CM | POA: Diagnosis not present

## 2018-11-10 LAB — CBC WITH DIFFERENTIAL/PLATELET
Basophils Absolute: 0 10*3/uL (ref 0.0–0.1)
Basophils Relative: 0.3 % (ref 0.0–3.0)
Eosinophils Absolute: 0.2 10*3/uL (ref 0.0–0.7)
Eosinophils Relative: 1.8 % (ref 0.0–5.0)
HCT: 48.8 % — ABNORMAL HIGH (ref 36.0–46.0)
Hemoglobin: 15.8 g/dL — ABNORMAL HIGH (ref 12.0–15.0)
Lymphocytes Relative: 14.5 % (ref 12.0–46.0)
Lymphs Abs: 1.4 10*3/uL (ref 0.7–4.0)
MCHC: 32.3 g/dL (ref 30.0–36.0)
MCV: 90.8 fl (ref 78.0–100.0)
Monocytes Absolute: 0.8 10*3/uL (ref 0.1–1.0)
Monocytes Relative: 8.3 % (ref 3.0–12.0)
Neutro Abs: 7.2 10*3/uL (ref 1.4–7.7)
Neutrophils Relative %: 75.1 % (ref 43.0–77.0)
Platelets: 348 10*3/uL (ref 150.0–400.0)
RBC: 5.37 Mil/uL — ABNORMAL HIGH (ref 3.87–5.11)
RDW: 14 % (ref 11.5–15.5)
WBC: 9.6 10*3/uL (ref 4.0–10.5)

## 2018-11-10 LAB — COMPREHENSIVE METABOLIC PANEL
ALT: 26 U/L (ref 0–35)
AST: 20 U/L (ref 0–37)
Albumin: 4.2 g/dL (ref 3.5–5.2)
Alkaline Phosphatase: 94 U/L (ref 39–117)
BUN: 11 mg/dL (ref 6–23)
CO2: 30 mEq/L (ref 19–32)
Calcium: 9.4 mg/dL (ref 8.4–10.5)
Chloride: 104 mEq/L (ref 96–112)
Creatinine, Ser: 0.58 mg/dL (ref 0.40–1.20)
GFR: 104.57 mL/min (ref >60.00)
Glucose, Bld: 103 mg/dL — ABNORMAL HIGH (ref 70–99)
Potassium: 4 mEq/L (ref 3.5–5.1)
Sodium: 144 mEq/L (ref 135–145)
Total Bilirubin: 0.2 mg/dL (ref 0.2–1.2)
Total Protein: 6.5 g/dL (ref 6.0–8.3)

## 2018-11-10 LAB — LIPID PANEL
Cholesterol: 180 mg/dL (ref 0–200)
HDL: 37.8 mg/dL — ABNORMAL LOW (ref >39.00)
LDL Cholesterol: 104 mg/dL — ABNORMAL HIGH (ref 0–99)
NonHDL: 142.38
Total CHOL/HDL Ratio: 5
Triglycerides: 190 mg/dL — ABNORMAL HIGH (ref 0.0–149.0)
VLDL: 38 mg/dL (ref 0.0–40.0)

## 2018-11-10 LAB — TSH: TSH: 2.09 u[IU]/mL (ref 0.35–4.50)

## 2018-11-10 NOTE — Progress Notes (Signed)
Subjective:     Patient ID: Linda Thompson, female   DOB: [birth year], 63 y.o.   MRN: [id number]  HPI Linda Thompson has chronic problems including hypertension, irritable bowel syndrome, GERD, type 2 diabetes, hyperlipidemia, obesity.  History is somewhat challenging.  She states that her main concern is decreased appetite and some weight loss.  She does have documented weight loss since last year.  She had weight of 205 pounds April 2019 and this was down to 183 pounds December 2019 and 174 pounds March 2020.  Her weight today is [id number] pounds so it certainly appears her weight has at least been stable over the past 6 months.  She relates decreased taste of foods for several months but no decreased smell.  She has type 2 diabetes and has had good control with last A1c 6.2%.  Denies any polydipsia.  Occasional urine frequency.  She has hyperlipidemia treated with Lipitor.  Medications reviewed. Last colonoscopy 2017 with recommended 3-year follow-up.  She states she had mammogram September 30.  Sees GYN regularly.  Denies any consistent headaches, dysphagia, persistent cough, dyspnea, chest pain, abdominal pain, dysuria, adenopathy.  Non-smoker  Wt Readings from Last 3 Encounters:  11/10/18 180 lb 9.6 oz (81.9 kg)  10/01/18 179 lb (81.2 kg)  09/15/18 178 lb (80.7 kg)     Past Medical History:  Diagnosis Date  . Allergic rhinitis   . Allergy   . Anemia   . Chronic low back pain   . DDD (degenerative disc disease), lumbar   . GERD (gastroesophageal reflux disease)    w/ hx diaphragmatic hernia  . Glaucoma   . GLAUCOMA NOS 03/11/2006   Qualifier: Diagnosis of  By: Diona Browner MD, Linda Thompson    . Heart murmur   . Hx of fracture    R arm, foot  . Hyperlipemia   . Hypertension   . IBS (irritable bowel syndrome)   . Low back pain 11/22/2012  . Meningioma 12/20/2001   Repeat MR from 2017 - radiology feels not a meningioma, but rather "Thickening of the anterior falx appears to be related to degenerative  ossification and not meningioma." HCPOA opted not to do repeat imaging with CT for the possible hemangioma and declined further evaluation of this.  . Meningioma ([place])    ant falx, stable on prior imaging  . Moderate intellectual disabilities   . Murmur   . Obesity   . Osteoarthrosis, unspecified whether generalized or localized, ankle and foot   . Sarcoidosis    difuse bony lesions and LDA, dx by biopsy in 2016  . Transaminitis   . Type II or unspecified type diabetes mellitus without mention of complication, not stated as uncontrolled    Past Surgical History:  Procedure Laterality Date  . CATARACT EXTRACTION Bilateral    Dr Katy Fitch  . COLONOSCOPY  04/15/2011  . ELBOW SURGERY     right elbow  . LIPOMA EXCISION Left [id number]   posterior left axilla  . TOTAL ABDOMINAL HYSTERECTOMY  03/05/1992   leiomyoma and cellular leiomyoma    reports that she has never smoked. She has never used smokeless tobacco. She reports that she does not drink alcohol or use drugs. family history includes Diabetes in her maternal aunt, maternal grandmother, and mother; Heart disease in her maternal aunt; Hypertension in her maternal grandmother and mother; Stroke in her father, maternal grandmother, and mother. Allergies  Allergen Reactions  . Ace Inhibitors Cough  . Amoxicillin-Pot Clavulanate Diarrhea  . Augmentin [Amoxicillin-Pot Clavulanate] Other (  See Comments)    diarrhea  . Azithromycin Diarrhea  . Ceftin [Cefuroxime Axetil]     diarrhea  . Cefuroxime Diarrhea and Other (See Comments)    Generalized weakness  . Cyclobenzaprine Hcl Diarrhea  . Flexeril [Cyclobenzaprine] Diarrhea  . Flonase [Fluticasone Propionate] Other (See Comments)    Nose bleed  . Ibuprofen Nausea And Vomiting     Review of Systems  Constitutional: Positive for appetite change. Negative for chills and fever.  HENT: Negative for sore throat and trouble swallowing.   Respiratory: Negative for cough and shortness of  breath.   Cardiovascular: Negative for chest pain.  Gastrointestinal: Negative for abdominal pain, blood in stool and diarrhea.  Endocrine: Negative for polydipsia and polyuria.  Genitourinary: Negative for dysuria.  Neurological: Negative for headaches.  Hematological: Negative for adenopathy.  Psychiatric/Behavioral: Negative for dysphoric mood.       Objective:   Physical Exam Constitutional:      Appearance: Normal appearance.  Neck:     Musculoskeletal: Neck supple.  Cardiovascular:     Rate and Rhythm: Normal rate and regular rhythm.  Pulmonary:     Effort: Pulmonary effort is normal.     Breath sounds: Normal breath sounds.  Abdominal:     Palpations: Abdomen is soft.     Tenderness: There is no abdominal tenderness.  Musculoskeletal:     Right lower leg: No edema.     Left lower leg: No edema.  Lymphadenopathy:     Cervical: No cervical adenopathy.  Neurological:     General: No focal deficit present.     Mental Status: She is alert and oriented to person, place, and time.  Psychiatric:        Mood and Affect: Mood normal.        Thought Content: Thought content normal.        Assessment:     #1 concerns for weight loss.  She is not weighing regularly at home.  She has lost some weight from over a year ago but over the past 6 months has had stable weight documented  #2 type 2 diabetes.  History of good control.  Last A1c 6.2%  #3 hyperlipidemia.  Overdue for lipids    Plan:     -Keep appointment with her primary provider for next Tuesday -Check further labs with CBC, comprehensive metabolic panel, TSH, repeat A1c, lipids -She has been taking supplement with boost or Ensure.  Because of her diabetes history we recommend instead Glucerna -She is encouraged to schedule repeat colonoscopy which she is due for at this time  Eulas Post MD [facility]

## 2018-11-10 NOTE — Patient Instructions (Signed)
Keep your appointment with Dr Volanda Napoleon  Stop the Boost and Ensure.  May take Glucerna.

## 2018-11-11 ENCOUNTER — Telehealth: Payer: Self-pay | Admitting: Family Medicine

## 2018-11-11 LAB — HEMOGLOBIN A1C: Hgb A1c MFr Bld: 6.3 % (ref 4.6–6.5)

## 2018-11-11 NOTE — Telephone Encounter (Signed)
Pt called to ask Dr. Elease Hashimoto How many of the [facility] she is supposed to take. She wants to know if she should take 1 or 2/ please advise asap due to Pt having limiter transportation to get to pharmacy / leave a message if she doesn't answer

## 2018-11-11 NOTE — Telephone Encounter (Signed)
Pt is calling back  To see how many glucerna she should take a day. Pt is aware waiting on provider to address

## 2018-11-11 NOTE — Telephone Encounter (Signed)
Pt called back and stated she never heard back from anyone in regards to the Glucerna. Pt requests call back

## 2018-11-16 ENCOUNTER — Ambulatory Visit: Payer: Medicare Other | Admitting: Family Medicine

## 2018-11-16 NOTE — Telephone Encounter (Signed)
Pt had an appointment scheduled for 11/10/2018 with Dr Elease Hashimoto.

## 2018-11-16 NOTE — Telephone Encounter (Signed)
Pt was seen by Dr Elease Hashimoto

## 2018-11-16 NOTE — Telephone Encounter (Signed)
Spoke with pt verbalized understanding of how many glucerna she needs to take, pt states that she had an office visit with Dr Elease Hashimoto and he advised pt to take 1-2 Glucerna a day.

## 2018-11-19 ENCOUNTER — Other Ambulatory Visit: Payer: Self-pay | Admitting: Family Medicine

## 2018-11-26 ENCOUNTER — Telehealth: Payer: Self-pay | Admitting: *Deleted

## 2018-11-26 NOTE — Telephone Encounter (Signed)
Spoke with pt states that she had an episode of diarrhea last night but she says she is better and has not had anymore diarrhea, Pt was offered a virtual visit with Dr Volanda Napoleon but declined state that she will call the office if she needs to.

## 2018-11-26 NOTE — Telephone Encounter (Signed)
Patient called after hours line on 11/25/2018 at 2047. Patient reports she is having diarrhea. Stomach hasn't been bothering her. Ate 2 apples and chicken nuggets. Had incontinence prior to making to bathroom. Started tonight and 1 bout of diarrhea. Stomach bothering her enough to notice. No fever or other symptoms.

## 2018-12-01 ENCOUNTER — Encounter (INDEPENDENT_AMBULATORY_CARE_PROVIDER_SITE_OTHER): Payer: Medicare Other | Admitting: Ophthalmology

## 2018-12-01 DIAGNOSIS — H35033 Hypertensive retinopathy, bilateral: Secondary | ICD-10-CM

## 2018-12-01 DIAGNOSIS — H34832 Tributary (branch) retinal vein occlusion, left eye, with macular edema: Secondary | ICD-10-CM

## 2018-12-01 DIAGNOSIS — H33302 Unspecified retinal break, left eye: Secondary | ICD-10-CM

## 2018-12-01 DIAGNOSIS — H43813 Vitreous degeneration, bilateral: Secondary | ICD-10-CM | POA: Diagnosis not present

## 2018-12-01 DIAGNOSIS — I10 Essential (primary) hypertension: Secondary | ICD-10-CM

## 2018-12-09 DIAGNOSIS — K5900 Constipation, unspecified: Secondary | ICD-10-CM | POA: Diagnosis not present

## 2018-12-09 DIAGNOSIS — Z9889 Other specified postprocedural states: Secondary | ICD-10-CM | POA: Diagnosis not present

## 2018-12-09 DIAGNOSIS — J449 Chronic obstructive pulmonary disease, unspecified: Secondary | ICD-10-CM | POA: Diagnosis not present

## 2018-12-09 DIAGNOSIS — K317 Polyp of stomach and duodenum: Secondary | ICD-10-CM | POA: Diagnosis not present

## 2018-12-13 ENCOUNTER — Telehealth: Payer: Self-pay

## 2018-12-13 ENCOUNTER — Telehealth: Payer: Self-pay | Admitting: *Deleted

## 2018-12-13 NOTE — Telephone Encounter (Signed)
Patient called after hours line. Patient reports she has ate and then coughed up some. She had this happen a couple of times a couple days ago. She is wondering what she should do if this happens again.

## 2018-12-13 NOTE — Telephone Encounter (Signed)
Called and spoke with pt states that she had a bad cough last night which did not last long states that she coughed hard and made her eyes red. Pt advised for a virtual visit but declined states that she has not has not had a cough all day today. Pt was advised to call our office if needs appointment. Verbalized understanding.

## 2018-12-13 NOTE — Telephone Encounter (Signed)
  Called and spoke with pt states that she had a bad cough last night which did not last long states that she coughed hard and made her eyes red. Pt advised for a virtual visit but declined states that she has not has not had a cough all day today. Pt was advised to call our office if needs appointment. Verbalized understanding   Copied from [place] [phone]. Topic: General - Other >> Dec 13, 2018  7:06 AM Carolyn Stare wrote: Pt called an spoke to the nurse last night cause she coughed up phelm and her face and eyes turned red, she wanted to let the doctor know and ask if the doctor can call her

## 2018-12-24 ENCOUNTER — Other Ambulatory Visit: Payer: Self-pay | Admitting: Family Medicine

## 2018-12-29 ENCOUNTER — Other Ambulatory Visit: Payer: Self-pay

## 2018-12-29 ENCOUNTER — Encounter (INDEPENDENT_AMBULATORY_CARE_PROVIDER_SITE_OTHER): Payer: Medicare Other | Admitting: Ophthalmology

## 2018-12-29 DIAGNOSIS — H35033 Hypertensive retinopathy, bilateral: Secondary | ICD-10-CM | POA: Diagnosis not present

## 2018-12-29 DIAGNOSIS — H43813 Vitreous degeneration, bilateral: Secondary | ICD-10-CM | POA: Diagnosis not present

## 2018-12-29 DIAGNOSIS — I10 Essential (primary) hypertension: Secondary | ICD-10-CM | POA: Diagnosis not present

## 2018-12-29 DIAGNOSIS — H34832 Tributary (branch) retinal vein occlusion, left eye, with macular edema: Secondary | ICD-10-CM

## 2019-01-12 ENCOUNTER — Ambulatory Visit: Payer: Medicare Other | Admitting: Podiatry

## 2019-01-21 ENCOUNTER — Other Ambulatory Visit: Payer: Self-pay | Admitting: Family Medicine

## 2019-01-25 ENCOUNTER — Ambulatory Visit (INDEPENDENT_AMBULATORY_CARE_PROVIDER_SITE_OTHER): Payer: Medicare Other | Admitting: Otolaryngology

## 2019-01-25 ENCOUNTER — Other Ambulatory Visit: Payer: Self-pay

## 2019-01-25 ENCOUNTER — Encounter (INDEPENDENT_AMBULATORY_CARE_PROVIDER_SITE_OTHER): Payer: Self-pay | Admitting: Otolaryngology

## 2019-01-25 VITALS — Temp 98.1°F

## 2019-01-25 DIAGNOSIS — J029 Acute pharyngitis, unspecified: Secondary | ICD-10-CM

## 2019-01-25 DIAGNOSIS — K219 Gastro-esophageal reflux disease without esophagitis: Secondary | ICD-10-CM

## 2019-01-25 NOTE — Progress Notes (Signed)
HPI: Linda Thompson is a 64 y.o. female who returns today for evaluation of a chronic sore throat she has had for the past week. She does not really describe difficulty eating but describes more of a discomfort in her throat. She has had some mild nasal congestion.. She takes antacid omeprazole in the morning.  Past Medical History:  Diagnosis Date  . Allergic rhinitis   . Allergy   . Anemia   . Chronic low back pain   . DDD (degenerative disc disease), lumbar   . GERD (gastroesophageal reflux disease)    w/ hx diaphragmatic hernia  . Glaucoma   . GLAUCOMA NOS 03/11/2006   Qualifier: Diagnosis of  By: Diona Browner MD, Amy    . Heart murmur   . Hx of fracture    R arm, foot  . Hyperlipemia   . Hypertension   . IBS (irritable bowel syndrome)   . Low back pain 11/22/2012  . Meningioma 12/20/2001   Repeat MR from 2017 - radiology feels not a meningioma, but rather "Thickening of the anterior falx appears to be related to degenerative ossification and not meningioma." HCPOA opted not to do repeat imaging with CT for the possible hemangioma and declined further evaluation of this.  . Meningioma ([place])    ant falx, stable on prior imaging  . Moderate intellectual disabilities   . Murmur   . Obesity   . Osteoarthrosis, unspecified whether generalized or localized, ankle and foot   . Sarcoidosis    difuse bony lesions and LDA, dx by biopsy in 2016  . Transaminitis   . Type II or unspecified type diabetes mellitus without mention of complication, not stated as uncontrolled    Past Surgical History:  Procedure Laterality Date  . CATARACT EXTRACTION Bilateral    Dr Katy Fitch  . COLONOSCOPY  04/15/2011  . ELBOW SURGERY     right elbow  . LIPOMA EXCISION Left [id number]   posterior left axilla  . TOTAL ABDOMINAL HYSTERECTOMY  03/05/1992   leiomyoma and cellular leiomyoma   Social History   Socioeconomic History  . Marital status: Single    Spouse name: Not on file  . Number of children: 0  .  Years of education: Not on file  . Highest education level: Not on file  Occupational History  . Occupation: Disabled (from arm fracture)  Tobacco Use  . Smoking status: Never Smoker  . Smokeless tobacco: Never Used  Substance and Sexual Activity  . Alcohol use: No    Alcohol/week: 0.0 standard drinks  . Drug use: No  . Sexual activity: Never    Birth control/protection: Surgical    Comment: Hyst--TAH--unsure if has ovaries  Other Topics Concern  . Not on file  Social History Narrative         Currently-disability for arm fracture      Mental Retardation, but is able to drive to places she is comfortable with and prepare her own meals and manage most of her own affairs.      [name] (neighbor) is POA/HCPOA      Reports she gets regular exercise and tries to eat healthy   Social Determinants of Health   Financial Resource Strain:   . Difficulty of Paying Living Expenses: Not on file  Food Insecurity:   . Worried About Charity fundraiser in the Last Year: Not on file  . Ran Out of Food in the Last Year: Not on file  Transportation Needs:   . Lack  of Transportation (Medical): Not on file  . Lack of Transportation (Non-Medical): Not on file  Physical Activity:   . Days of Exercise per Week: Not on file  . Minutes of Exercise per Session: Not on file  Stress:   . Feeling of Stress : Not on file  Social Connections:   . Frequency of Communication with Friends and Family: Not on file  . Frequency of Social Gatherings with Friends and Family: Not on file  . Attends Religious Services: Not on file  . Active Member of Clubs or Organizations: Not on file  . Attends Archivist Meetings: Not on file  . Marital Status: Not on file   Family History  Problem Relation Age of Onset  . Diabetes Mother   . Stroke Mother   . Hypertension Mother   . Stroke Father   . Diabetes Maternal Aunt   . Heart disease Maternal Aunt         (had pacemaker)  . Diabetes Maternal  Grandmother   . Stroke Maternal Grandmother   . Hypertension Maternal Grandmother   . Colon cancer Neg Hx    Allergies  Allergen Reactions  . Ace Inhibitors Cough  . Amoxicillin-Pot Clavulanate Diarrhea  . Augmentin [Amoxicillin-Pot Clavulanate] Other (See Comments)    diarrhea  . Azithromycin Diarrhea  . Ceftin [Cefuroxime Axetil]     diarrhea  . Cefuroxime Diarrhea and Other (See Comments)    Generalized weakness  . Cyclobenzaprine Hcl Diarrhea  . Flexeril [Cyclobenzaprine] Diarrhea  . Flonase [Fluticasone Propionate] Other (See Comments)    Nose bleed  . Ibuprofen Nausea And Vomiting   Prior to Admission medications   Medication Sig Start Date End Date Taking? Authorizing Provider  acetaminophen (TYLENOL) 500 MG tablet Take 500 mg by mouth every 6 (six) hours as needed for mild pain.    Yes [provider]  Besifloxacin HCl (BESIVANCE) 0.6 % SUSP Apply to eye. 4 times each day for 2 days   Yes [provider]  diclofenac sodium (VOLTAREN) 1 % GEL APPLY 2 GRAMS TO AFFECTED AREA 4 TIMES A DAY Patient taking differently: Apply 2 g topically 4 (four) times daily.  02/08/18  Yes Evelina Bucy, DPM  diltiazem (CARDIZEM CD) 240 MG 24 hr capsule TAKE 1 CAPSULE BY MOUTH EVERY DAY 11/22/18  Yes [name], MD  ferrous sulfate 325 (65 FE) MG tablet Take 325 mg by mouth 3 (three) times daily with meals.    Yes [provider]  fluticasone (FLONASE) 50 MCG/ACT nasal spray PLACE 2 SPRAYS INTO BOTH NOSTRILS AT BEDTIME. 10/11/18  Yes [name], MD  hydrochlorothiazide (MICROZIDE) 12.5 MG capsule TAKE 1 CAPSULE BY MOUTH EVERY DAY 11/22/18  Yes [name], MD  hydrocortisone 2.5 % ointment APPLY TO AFFECTED AREA TWICE DAILY [place] ONLY 05/18/18  Yes [provider]  ipratropium (ATROVENT) 0.06 % nasal spray SPRAY 2 SPRAYS IN EACH NOSTRIL 4 TIMES A DAY 01/24/19  Yes [name], MD  ketoconazole (NIZORAL) 2 % shampoo APPLY TO  AFFECTED AREA TWICE A WEEK AS DIRECTED 12/24/18  Yes [name], MD  latanoprost (XALATAN) 0.005 % ophthalmic solution Place 1 drop into both eyes at bedtime.  04/10/11  Yes [provider]  loratadine (CLARITIN) 10 MG tablet Take 1 tablet (10 mg total) by mouth daily. 06/24/18  Yes [name] R, DO  metFORMIN (GLUCOPHAGE) 1000 MG tablet TAKE 1 TABLET TWICE A DAY WITH A MEAL 11/22/18  Yes [name], MD  mometasone (ELOCON) 0.1 % ointment APPLY A SMALL AMOUNT TO AFFECTED AREA AS DIRECTED 05/04/18  Yes [provider]  omeprazole (PRILOSEC) 20 MG capsule TAKE 1 CAPSULE (20 MG TOTAL) BY MOUTH 2 (TWO) TIMES DAILY. 07/27/18  Yes [name], MD  pravastatin (PRAVACHOL) 40 MG tablet TAKE 1 TABLET BY MOUTH EVERY DAY IN THE EVENING 11/08/18  Yes [name], MD  Vitamin D, Cholecalciferol, 25 MCG (1000 UT) CAPS Take 1,000 Units by mouth daily.   Yes [provider]  Vitamins-Lipotropics ([facility] INNER EAR PLUS PO) Take 2 tablets by mouth 2 (two) times daily.    Yes [provider]  diltiazem (TIAZAC) 240 MG 24 hr capsule Take 1 capsule (240 mg total) by mouth daily. 07/20/13 10/01/13  Dutch Quint B, FNP     Positive ROS: Otherwise negative  All other systems have been reviewed and were otherwise negative with the exception of those mentioned in the HPI and as above.  Physical Exam: Constitutional: Alert, well-appearing, no acute distress Ears: External ears without lesions or tenderness. Ear canals are clear bilaterally with intact, clear TMs.  Nasal: External nose without lesions. Septum with minimal deformity.. Mild rhinitis. No obvious mucopurulent discharge noted from the middle meatus. Oral: Lips and gums without lesions. Tongue and palate mucosa without lesions. Posterior oropharynx clear. Perhaps mild erythema but no purulence. Indirect laryngoscopy revealed clear base of tongue vallecula epiglottis. Neck: No palpable adenopathy or  masses Respiratory: Breathing comfortably  Skin: No facial/neck lesions or rash noted. Fiberoptic laryngoscopy through the right nostril revealed a clear nasopharynx, base of tongue, epiglottis, vocal cords and piriform sinuses.  Laryngoscopy  Date/Time: 01/25/2019 6:06 PM Performed by: Rozetta Nunnery, MD Authorized by: Rozetta Nunnery, MD   Consent:    Consent obtained:  Verbal   Consent given by:  Patient   Risks discussed:  Pain Procedure details:    Indications: hoarseness, dysphagia, or aspiration     Instrument: flexible fiberoptic laryngoscope     Scope location: right nare   Mouth:    Oropharynx: normal     Vallecula: normal     Base of tongue: normal     Epiglottis: normal   Throat:    Pyriform sinus: normal     False vocal cords: normal     True vocal cords: normal   Comments:     Essentially clear upper airway examination on fiberoptic laryngoscopy.    Assessment: Pharyngitis History of GE reflux disease. History of rhinitis  Plan: Recommended continue use of her Flonase. Suggested take the omeprazole before dinner instead of first thing in the morning. Prescribed a Z-Pak for 5 days if the sore throat persist. Also gave her some samples of Cepacol lozenges   Radene Journey, MD

## 2019-01-28 ENCOUNTER — Encounter: Payer: Self-pay | Admitting: Family Medicine

## 2019-02-01 ENCOUNTER — Ambulatory Visit: Payer: Self-pay

## 2019-02-01 NOTE — Telephone Encounter (Signed)
Pt called and stated that her throat was sore and made apt to ENT doctor. Was rx azithromycin. Pt completed course of abx Friday. 3 loose BM's this am. Stomach feels empty. Postponed appt for tomorrow.  Pt stated she has had 3 loose stools today. Pt is drinking water and denies nausea or vomiting. Sore throat has resolved. Care advice given and pt verbalized understanding.   Reason for Disposition . [1] MILD diarrhea AND [2] taking antibiotics  Answer Assessment - Initial Assessment Questions 1. DIARRHEA SEVERITY: "How bad is the diarrhea?" "How many extra stools have you had in the past 24 hours than normal?"    - NO DIARRHEA (SCALE 0)   - MILD (SCALE 1-3): Few loose or mushy BMs; increase of 1-3 stools over normal daily number of stools; mild increase in ostomy output.   -  MODERATE (SCALE 4-7): Increase of 4-6 stools daily over normal; moderate increase in ostomy output. * SEVERE (SCALE 8-10; OR 'WORST POSSIBLE'): Increase of 7 or more stools daily over normal; moderate increase in ostomy output; incontinence.     mild 2. ONSET: "When did the diarrhea begin?"       3. BM CONSISTENCY: "How loose or watery is the diarrhea?"      loose 4. VOMITING: "Are you also vomiting?" If so, ask: "How many times in the past 24 hours?"      no 5. ABDOMINAL PAIN: "Are you having any abdominal pain?" If yes: "What does it feel like?" (e.g., crampy, dull, intermittent, constant)      Denies - just feels like her stomach 6. ABDOMINAL PAIN SEVERITY: If present, ask: "How bad is the pain?"  (e.g., Scale 1-10; mild, moderate, or severe)   - MILD (1-3): doesn't interfere with normal activities, abdomen soft and not tender to touch    - MODERATE (4-7): interferes with normal activities or awakens from sleep, tender to touch    - SEVERE (8-10): excruciating pain, doubled over, unable to do any normal activities       none 7. ORAL INTAKE: If vomiting, "Have you been able to drink liquids?" "How much fluids have  you had in the past 24 hours?"     yes 8. HYDRATION: "Any signs of dehydration?" (e.g., dry mouth [not just dry lips], too weak to stand, dizziness, new weight loss) "When did you last urinate?"     no 9. EXPOSURE: "Have you traveled to a foreign country recently?" "Have you been exposed to anyone with diarrhea?" "Could you have eaten any food that was spoiled?"     no 10. ANTIBIOTIC USE: "Are you taking antibiotics now or have you taken antibiotics in the past 2 months?"       yes 11. OTHER SYMPTOMS: "Do you have any other symptoms?" (e.g., fever, blood in stool)       no 12. PREGNANCY: "Is there any chance you are pregnant?" "When was your last menstrual period?"       n/a  Protocols used: DIARRHEA-A-AH

## 2019-02-02 ENCOUNTER — Ambulatory Visit: Payer: Self-pay | Admitting: Family Medicine

## 2019-02-02 ENCOUNTER — Encounter (INDEPENDENT_AMBULATORY_CARE_PROVIDER_SITE_OTHER): Payer: Medicare Other | Admitting: Ophthalmology

## 2019-02-02 NOTE — Telephone Encounter (Addendum)
Message from [place], [place] sent at 02/02/2019 8:28 AM EST  Summary: cannot have BM    Patient called in stating she spoke with RN yesterday and is seeking more advice. Pt is wanting to know what she can do to help her bowel movement. Pt states she has been eating and following nurses advice with having yogurt, gingerale, and other recommendations. Please advise as pt is worried she has not had a BM since. Call back is [phone].          Message from [place], [place] sent at 02/02/2019 8:28 AM EST

## 2019-02-02 NOTE — Telephone Encounter (Signed)
Message from [place], [place] sent at 02/02/2019 8:28 AM EST  Summary: cannot have BM    Patient called in stating she spoke with RN yesterday and is seeking more advice. Pt is wanting to know what she can do to help her bowel movement. Pt states she has been eating and following nurses advice with having yogurt, gingerale, and other recommendations. Please advise as pt is worried she has not had a BM since. Call back is [phone].          Pt called yesterday with diarrhea and this am with constipation, pt had formed BM this morning. Diet recommendations. Reason for Disposition . Mild constipation  Answer Assessment - Initial Assessment Questions 1. STOOL PATTERN OR FREQUENCY: "How often do you pass bowel movements (BMs)?"  (Normal range: tid to q 3 days)  "When was the last BM passed?"       daily 2. STRAINING: "Do you have to strain to have a BM?"      no 3. RECTAL PAIN: "Does your rectum hurt when the stool comes out?" If so, ask: "Do you have hemorrhoids? How bad is the pain?"  (Scale 1-10; or mild, moderate, severe)     no 4. STOOL COMPOSITION: "Are the stools hard?"      no 5. BLOOD ON STOOLS: "Has there been any blood on the toilet tissue or on the surface of the BM?" If so, ask: "When was the last time?"      no 6. CHRONIC CONSTIPATION: "Is this a new problem for you?"  If no, ask: "How long have you had this problem?" (days, weeks, months)      no 7. CHANGES IN DIET: "Have there been any recent changes in your diet?"      Added yogurt 8. MEDICATIONS: "Have you been taking any new medications?"     no 9. LAXATIVES: "Have you been using any laxatives or enemas?"  If yes, ask "What, how often, and when was the last time?"     no 10. CAUSE: "What do you think is causing the constipation?"        She had bm this morning 11. OTHER SYMPTOMS: "Do you have any other symptoms?" (e.g., abdominal pain, fever, vomiting)       no 12. PREGNANCY: "Is there any chance you are  pregnant?" "When was your last menstrual period?"       no  Protocols used: CONSTIPATION-A-AH

## 2019-02-08 ENCOUNTER — Telehealth: Payer: Self-pay | Admitting: *Deleted

## 2019-02-08 ENCOUNTER — Telehealth (INDEPENDENT_AMBULATORY_CARE_PROVIDER_SITE_OTHER): Payer: Medicare Other | Admitting: Family Medicine

## 2019-02-08 DIAGNOSIS — R195 Other fecal abnormalities: Secondary | ICD-10-CM

## 2019-02-08 NOTE — Telephone Encounter (Signed)
Pt is scheduled for a virtual this afternoon with Dr Volanda Napoleon

## 2019-02-08 NOTE — Telephone Encounter (Signed)
Left voice message for pt to call the office back regarding her diarrhea after taking z-pack

## 2019-02-08 NOTE — Telephone Encounter (Signed)
Patient called the after hours line multiple times last night. Patient  is c/o diarrhea for several days since completing a z-pack prescribed by ENT on 1/5/202. Patient reports she took the last pill on 01/26/2019.   Clinic RN attempted to call twice to triage. The line was busy.

## 2019-02-08 NOTE — Telephone Encounter (Signed)
Copied from [place] [id number]. Topic: General - Inquiry >> Feb 07, 2019  7:01 PM Linda Thompson wrote: Reason for CRM: Patient is needing a call back from someone at the office . Please call pt back before 12 pm

## 2019-02-08 NOTE — Telephone Encounter (Signed)
Clinic RN attempted to call patient again. No answer. LVM for patient to return call

## 2019-02-08 NOTE — Progress Notes (Signed)
Virtual Visit via Telephone Note  I connected with Linda Thompson on 02/08/19 at  4:00 PM EST by telephone and verified that I am speaking with the correct person using two identifiers.   I discussed the limitations, risks, security and privacy concerns of performing an evaluation and management service by telephone and the availability of in person appointments. I also discussed with the patient that there may be a patient responsible charge related to this service. The patient expressed understanding and agreed to proceed.  Location patient: home Location provider: work or home office Participants present for the call: patient, provider Patient did not have a visit in the prior 7 days to address this/these issue(s).   History of Present Illness: Pt is a 64 yo female with pmh sig for HTN, DM II, IBS, GERD, HLD, benign neoplasms of cerevral meninges, DDD, arthritis, glaucoma, sarcoidosis, Moderate ID.   Pt states she was given Azithromycin by her ENT provider.  Pt finished the rx on Jan 9th.  Inquires if the Azithromycin is out of her system.  Pt endorses having loose stools last wk.  Pt does note drinking coffee which caused her to have a BM.  Had 1-3 BMs over a few days.  Pt no longer having loose stools.  Pt rescheduled her eye appt on Wed as she was concerned she may have to use the restroom.   Observations/Objective: Patient sounds cheerful and well on the phone. I do not appreciate any SOB. Speech and thought processing are grossly intact. Patient reported vitals:  Assessment and Plan: Loose stools -resolved -symptoms likely multi-factorial given h/o IBS-mixed and recent abx use. -Azithromycin on pt's Allergy list.  Known to cause diarrhea -Advised coffee can cause loose stools -increased fiber encouraged. -consider yogurt or probiotic -continue to monitor  Follow Up Instructions: F/u prn  I did not refer this patient for an OV in the next 24 hours for this/these  issue(s).  I discussed the assessment and treatment plan with the patient. The patient was provided an opportunity to ask questions and all were answered. The patient agreed with the plan and demonstrated an understanding of the instructions.   The patient was advised to call back or seek an in-person evaluation if the symptoms worsen or if the condition fails to improve as anticipated.  I provided 19 minutes of non-face-to-face time during this encounter.   [name], MD

## 2019-02-09 NOTE — Telephone Encounter (Signed)
Pt had a virtual visit with Dr Volanda Napoleon yesterday afternoon

## 2019-02-10 ENCOUNTER — Encounter (INDEPENDENT_AMBULATORY_CARE_PROVIDER_SITE_OTHER): Payer: Medicare Other | Admitting: Ophthalmology

## 2019-02-10 ENCOUNTER — Telehealth: Payer: Self-pay | Admitting: *Deleted

## 2019-02-10 NOTE — Telephone Encounter (Signed)
Patient called after hours line. Patient had a telephonic appointment on Tuesday due to symptoms related to diarrhea. Patient stated she was told she did not have enough fiber in her system. She was advised to increase intake of fruits and vegetables. Patient  was requesting to confirm physician's order to increase intake of fruits and vegetables.   Clinic RN called patient.  Informed patient per Dr. Volanda Napoleon she was  -Advised coffee can cause loose stools -increased fiber encouraged. -consider yogurt or probiotic -continue to monitor  Patient verbalized understanding.

## 2019-02-14 ENCOUNTER — Telehealth: Payer: Self-pay | Admitting: *Deleted

## 2019-02-14 NOTE — Telephone Encounter (Signed)
Patient called after hours line this morning. Patient reports she  was given Azithromycin by her ENT. She is wondering about probiotics and if yogurt contains them. She called about the same thing last night. She is wondering if she can have a medication to make her not have a BM when she is out in public.

## 2019-02-14 NOTE — Telephone Encounter (Signed)
Spoke with pt state that she will hold off eating yogurt tomorrow because of loose. Pt state that she had a Dr appointment and she does not want to have accidents while at the office. Pt state that she will restart back yogurt on Wed after her appointment.Pt advised to contact the office if loose stools don't stop. Verbalized understanding

## 2019-02-14 NOTE — Telephone Encounter (Signed)
Please advise 

## 2019-02-14 NOTE — Telephone Encounter (Signed)
Please see last e-visit note.  Matter addressed then.  Can review plan with pt.

## 2019-02-15 ENCOUNTER — Telehealth: Payer: Self-pay

## 2019-02-15 ENCOUNTER — Telehealth: Payer: Self-pay | Admitting: Family Medicine

## 2019-02-15 NOTE — Telephone Encounter (Signed)
Noted  

## 2019-02-15 NOTE — Telephone Encounter (Signed)
Pt would like a call back from [place] because she is confused about the yogurt and the message that was left on her vm. Pt can be contacted at [phone]

## 2019-02-15 NOTE — Telephone Encounter (Signed)
Pt calling about again have some questions about Probiotics in yogurt. She would like a call back from Dr. Volanda Napoleon or CMA, explaining the message  That was left for her on her answering machine. She did not understand it.   please call the patient  at  [phone]

## 2019-02-15 NOTE — Telephone Encounter (Signed)
Pt is scheduled for an office visit with Dr Volanda Napoleon on 03/19/2018 at 3.30 pm

## 2019-02-16 ENCOUNTER — Encounter (INDEPENDENT_AMBULATORY_CARE_PROVIDER_SITE_OTHER): Payer: Medicare Other | Admitting: Ophthalmology

## 2019-02-17 ENCOUNTER — Telehealth (INDEPENDENT_AMBULATORY_CARE_PROVIDER_SITE_OTHER): Payer: Medicare Other | Admitting: Family Medicine

## 2019-02-17 DIAGNOSIS — K589 Irritable bowel syndrome without diarrhea: Secondary | ICD-10-CM | POA: Diagnosis not present

## 2019-02-17 NOTE — Progress Notes (Signed)
Virtual Visit via Telephone Note  I connected with Linda Thompson on 02/17/19 at  3:30 PM EST by telephone and verified that I am speaking with the correct person using two identifiers.   I discussed the limitations, risks, security and privacy concerns of performing an evaluation and management service by telephone and the availability of in person appointments. I also discussed with the patient that there may be a patient responsible charge related to this service. The patient expressed understanding and agreed to proceed.  Location patient: home Location provider: work or home office Participants present for the call: patient, provider Patient did not have a visit in the prior 7 days to address this/these issue(s).   History of Present Illness: Pt is a 64 yo female with pmh sig for HTN, DM, IBS, GERD, moderate ID, sarcoidosis, glaucoma who is being seen for f/u.  Pt seen states she stopped drinking coffee and soda.  Notes she ate yogurt with probiotic which seemed to help.  She had a large BM on Tuesday night.  Pt had 3 BMs today.  Pt states she is concerned she may have a BM when at an appointment so she cancelled her Dental appt.     Observations/Objective: Patient sounds cheerful and well on the phone. I do not appreciate any SOB. Speech and thought processing are grossly intact. Patient reported vitals:  Assessment and Plan: Irritable bowel syndrome, unspecified type -advance diet as tolerated -ok to try OTC Imodium if needed -for continued issues f/u advised with her Gastroenterologist, [name]   Follow Up Instructions: F/u prn  I did not refer this patient for an OV in the next 24 hours for this/these issue(s).  I discussed the assessment and treatment plan with the patient. The patient was provided an opportunity to ask questions and all were answered. The patient agreed with the plan and demonstrated an understanding of the instructions.   The patient was advised  to call back or seek an in-person evaluation if the symptoms worsen or if the condition fails to improve as anticipated.  I provided 19 minutes of non-face-to-face time during this encounter.   [name], MD

## 2019-02-25 ENCOUNTER — Telehealth: Payer: Self-pay | Admitting: Family Medicine

## 2019-02-25 NOTE — Telephone Encounter (Signed)
Spoke with pt POA Mateo Flow, state that pt bee calling her GI with complaints of abdominal issues, pt POA state that pt had blood work done previously and pt B12 levels have been low, state that pt GI has recommended pt to start B12 Injections. Pt POA wants to know if its ok for pt to have her Vit B12 level checked due to her ongoing abdominal issues, please advise

## 2019-02-25 NOTE — Telephone Encounter (Signed)
Pt's health care power of attorney(Valerie) would like to speak to a nurse regarding pt's B12 is low. Mateo Flow is not sure if pt needs to have blood work and start pt on B12 shots. Thanks   Valerie's # [phone]

## 2019-03-02 ENCOUNTER — Other Ambulatory Visit: Payer: Self-pay

## 2019-03-02 ENCOUNTER — Encounter (INDEPENDENT_AMBULATORY_CARE_PROVIDER_SITE_OTHER): Payer: Medicare Other | Admitting: Ophthalmology

## 2019-03-02 DIAGNOSIS — H35033 Hypertensive retinopathy, bilateral: Secondary | ICD-10-CM | POA: Diagnosis not present

## 2019-03-02 DIAGNOSIS — I10 Essential (primary) hypertension: Secondary | ICD-10-CM | POA: Diagnosis not present

## 2019-03-02 DIAGNOSIS — H33302 Unspecified retinal break, left eye: Secondary | ICD-10-CM

## 2019-03-02 DIAGNOSIS — H43813 Vitreous degeneration, bilateral: Secondary | ICD-10-CM | POA: Diagnosis not present

## 2019-03-02 DIAGNOSIS — H34832 Tributary (branch) retinal vein occlusion, left eye, with macular edema: Secondary | ICD-10-CM

## 2019-03-04 NOTE — Telephone Encounter (Signed)
If B12 was low when checked by GI they should replace it.   No recent B12 results seen in chart.  Would be beneficial to know what B12 was, when it was checked, etc?.

## 2019-03-07 NOTE — Telephone Encounter (Signed)
FYI Spoke with Linda Thompson pt POA, state that pt is not able to go to the GI for B12 injections since in [facility], state that the GI advised to have PCP f/u with this issue, Pt POA advised to request pt lab records and have them fax them to our office.

## 2019-03-30 ENCOUNTER — Other Ambulatory Visit: Payer: Self-pay

## 2019-03-30 ENCOUNTER — Encounter (INDEPENDENT_AMBULATORY_CARE_PROVIDER_SITE_OTHER): Payer: Medicare Other | Admitting: Ophthalmology

## 2019-03-30 DIAGNOSIS — H35033 Hypertensive retinopathy, bilateral: Secondary | ICD-10-CM | POA: Diagnosis not present

## 2019-03-30 DIAGNOSIS — H34832 Tributary (branch) retinal vein occlusion, left eye, with macular edema: Secondary | ICD-10-CM

## 2019-03-30 DIAGNOSIS — H43813 Vitreous degeneration, bilateral: Secondary | ICD-10-CM | POA: Diagnosis not present

## 2019-03-30 DIAGNOSIS — I10 Essential (primary) hypertension: Secondary | ICD-10-CM | POA: Diagnosis not present

## 2019-03-30 DIAGNOSIS — H33302 Unspecified retinal break, left eye: Secondary | ICD-10-CM

## 2019-04-13 ENCOUNTER — Encounter: Payer: Self-pay | Admitting: Certified Nurse Midwife

## 2019-04-16 ENCOUNTER — Other Ambulatory Visit: Payer: Self-pay | Admitting: Family Medicine

## 2019-04-17 ENCOUNTER — Other Ambulatory Visit: Payer: Self-pay | Admitting: Family Medicine

## 2019-04-18 ENCOUNTER — Other Ambulatory Visit: Payer: Self-pay | Admitting: Family Medicine

## 2019-04-20 ENCOUNTER — Other Ambulatory Visit: Payer: Self-pay | Admitting: Family Medicine

## 2019-04-20 NOTE — Telephone Encounter (Addendum)
Medication Refill: 90 days Hydrochlorothiazide 12.5 MG Pravastatin 40MG  Metformin 1000 MG  Pharmacy: [facility] [address] FAX: [phone]   Pt states she always gets 2 big bottles of the Metformin and would like to continue doing so.

## 2019-04-20 NOTE — Telephone Encounter (Signed)
Rxs previously sent on 3/29.

## 2019-04-24 ENCOUNTER — Other Ambulatory Visit: Payer: Self-pay | Admitting: Family Medicine

## 2019-04-26 ENCOUNTER — Telehealth: Payer: Self-pay | Admitting: Family Medicine

## 2019-04-26 ENCOUNTER — Telehealth (INDEPENDENT_AMBULATORY_CARE_PROVIDER_SITE_OTHER): Payer: Medicare Other | Admitting: Family Medicine

## 2019-04-26 DIAGNOSIS — J309 Allergic rhinitis, unspecified: Secondary | ICD-10-CM

## 2019-04-26 DIAGNOSIS — J029 Acute pharyngitis, unspecified: Secondary | ICD-10-CM

## 2019-04-26 NOTE — Progress Notes (Signed)
Virtual Visit via Telephone Note  I connected with Linda Thompson on 04/26/19 at  4:00 PM EDT by telephone and verified that I am speaking with the correct person using two identifiers.   I discussed the limitations, risks, security and privacy concerns of performing an evaluation and management service by telephone and the availability of in person appointments. I also discussed with the patient that there may be a patient responsible charge related to this service. The patient expressed understanding and agreed to proceed.  Location patient: home Location provider: work or home office Participants present for the call: patient, provider Patient did not have a visit in the prior 7 days to address this/these issue(s).   History of Present Illness: Pt is a 64 yo female with pmh sig for HTN, DM II, GERD, allergies, IBS-mixed, HLD, arthritis, moderate ID, sarcoidosis, and glaucoma seen for acute concern.  Pt notes sore throat, sneezing that started today.  Pt denies rhinorrhea, itchy eyes, watery eyes, cough, ear pain or pressure, facial pressure, n/v, HA, sick contacts.  Gargling with warm salt water.  Pt taking an allergy pill q am and nasal sprays.   Pt had 2 doses of COVID vaccine.  Pt has an appt tomorrow for an injection in her eye.  Pt mentions she was advised to start metamucil to help with her bowels.  Pt notes her GI provider in [place] will be retiring in June.   Observations/Objective: Patient sounds cheerful and well on the phone. I do not appreciate any SOB. Speech and thought processing are grossly intact. Patient reported vitals:  Assessment and Plan: Sore throat -discussed possible causes including post nasal drainage 2/2 allergies, viral URI, also consider strep throat.  Consider COVID-19. -continue supportive care.  Ok to gargle with warm salt water -given precautions  Allergic rhinitis, unspecified seasonality, unspecified trigger -continue claritin and  flonase.   -Also has a ipratropium nasal spray.  Follow Up Instructions: F/u prn  I did not refer this patient for an OV in the next 24 hours for this/these issue(s).  I discussed the assessment and treatment plan with the patient. The patient was provided an opportunity to ask questions and all were answered. The patient agreed with the plan and demonstrated an understanding of the instructions.   The patient was advised to call back or seek an in-person evaluation if the symptoms worsen or if the condition fails to improve as anticipated.  I provided 26 minutes of non-face-to-face time during this encounter.   [name], MD

## 2019-04-26 NOTE — Telephone Encounter (Signed)
Spoke with pt requested for a telephone visit with Dr Volanda Napoleon, pt scheduled for a telephone visit with Dr Volanda Napoleon at  4 pm

## 2019-04-26 NOTE — Telephone Encounter (Signed)
Pt feels as if she getting a cold and her tonsils are dropping she is sneezing. Please give pt a call back.

## 2019-04-27 ENCOUNTER — Other Ambulatory Visit: Payer: Self-pay

## 2019-04-27 ENCOUNTER — Encounter (INDEPENDENT_AMBULATORY_CARE_PROVIDER_SITE_OTHER): Payer: Medicare Other | Admitting: Ophthalmology

## 2019-04-27 DIAGNOSIS — I10 Essential (primary) hypertension: Secondary | ICD-10-CM | POA: Diagnosis not present

## 2019-04-27 DIAGNOSIS — H35033 Hypertensive retinopathy, bilateral: Secondary | ICD-10-CM | POA: Diagnosis not present

## 2019-04-27 DIAGNOSIS — H43813 Vitreous degeneration, bilateral: Secondary | ICD-10-CM

## 2019-04-27 DIAGNOSIS — H34832 Tributary (branch) retinal vein occlusion, left eye, with macular edema: Secondary | ICD-10-CM | POA: Diagnosis not present

## 2019-05-06 ENCOUNTER — Other Ambulatory Visit: Payer: Self-pay

## 2019-05-06 ENCOUNTER — Telehealth: Payer: Self-pay

## 2019-05-06 ENCOUNTER — Ambulatory Visit (INDEPENDENT_AMBULATORY_CARE_PROVIDER_SITE_OTHER): Payer: Medicare Other | Admitting: Podiatry

## 2019-05-06 DIAGNOSIS — M79674 Pain in right toe(s): Secondary | ICD-10-CM | POA: Diagnosis not present

## 2019-05-06 DIAGNOSIS — B351 Tinea unguium: Secondary | ICD-10-CM

## 2019-05-06 DIAGNOSIS — M79675 Pain in left toe(s): Secondary | ICD-10-CM | POA: Diagnosis not present

## 2019-05-06 DIAGNOSIS — E119 Type 2 diabetes mellitus without complications: Secondary | ICD-10-CM

## 2019-05-06 NOTE — Telephone Encounter (Signed)
Spoke with pt.  Pt reports using coconut oil every day for vaginal dryness and noticed some vaginal itching today. Pt denies vaginal bleeding, odor and fever, chills. Pt advised that coconut oil should not cause vaginal itching. Pt advised to have OV for further evaluation.  Pt agreeable. Pt scheduled for OV with Dr Talbert Nan as work in appt on 4/20 at 1:15pm. Pt agreeable and verbalized understanding of date and time.   Last AEX 07/21/18 with DL.  Last vaginitis test 9/11- neg.   Routing to Dr Talbert Nan for review.  Encounter closed.

## 2019-05-08 ENCOUNTER — Telehealth: Payer: Self-pay | Admitting: Obstetrics & Gynecology

## 2019-05-08 NOTE — Telephone Encounter (Signed)
Pt called via on-call number at [phone] to ask if she could change her appointment this week.  She was advised this is not something I can do (change appt) and that it wasn't an appropriate use of the on call number.  Pt was advised to call Monday to see if we could accommodate her needs and I advised her to use the on call number for urgent and emergent weekend needs going forward.  I did ask to make sure she was safe and at home and had no other needs.  She did not.  She will call Monday morning.

## 2019-05-09 ENCOUNTER — Telehealth: Payer: Self-pay | Admitting: Obstetrics and Gynecology

## 2019-05-09 NOTE — Telephone Encounter (Signed)
Patient cancelled appointment for itching because she is feeling better.

## 2019-05-10 ENCOUNTER — Ambulatory Visit: Payer: Self-pay | Admitting: Obstetrics and Gynecology

## 2019-05-13 ENCOUNTER — Ambulatory Visit: Payer: Medicare Other | Admitting: Podiatry

## 2019-05-13 DIAGNOSIS — Z20822 Contact with and (suspected) exposure to covid-19: Secondary | ICD-10-CM | POA: Diagnosis not present

## 2019-05-13 DIAGNOSIS — Z20828 Contact with and (suspected) exposure to other viral communicable diseases: Secondary | ICD-10-CM | POA: Diagnosis not present

## 2019-05-16 ENCOUNTER — Telehealth: Payer: Self-pay | Admitting: Family Medicine

## 2019-05-16 NOTE — Telephone Encounter (Signed)
Spoke with pt advised to have a visit with Dr Volanda Napoleon, pt declined state that she is taking her allergy medications and if she does not feel better she will call and schedule a phone visit with Dr Volanda Napoleon again.

## 2019-05-16 NOTE — Telephone Encounter (Signed)
Pt tonsils have dropped, pt would like a call back to go over what she can do for it.

## 2019-05-16 NOTE — Telephone Encounter (Signed)
Pt would like a call back because she thinks she is catching a cold but does not have a temp. She has a surgery scheduled for Friday at [facility] and would like Dr. Volanda Napoleon to call her before then.

## 2019-05-20 DIAGNOSIS — E119 Type 2 diabetes mellitus without complications: Secondary | ICD-10-CM | POA: Diagnosis not present

## 2019-05-20 DIAGNOSIS — Z8719 Personal history of other diseases of the digestive system: Secondary | ICD-10-CM | POA: Diagnosis not present

## 2019-05-20 DIAGNOSIS — E538 Deficiency of other specified B group vitamins: Secondary | ICD-10-CM | POA: Diagnosis not present

## 2019-05-20 DIAGNOSIS — K317 Polyp of stomach and duodenum: Secondary | ICD-10-CM | POA: Diagnosis not present

## 2019-05-23 ENCOUNTER — Telehealth: Payer: Self-pay | Admitting: *Deleted

## 2019-05-23 NOTE — Telephone Encounter (Signed)
Spoke with pt advised to bring the nasal spray with her to the visit with Dr Volanda Napoleon on 05/26/2019, verbalized understanding

## 2019-05-23 NOTE — Telephone Encounter (Signed)
Patient contacted after hours line multiple times. Patient reports she is confused about Ipratrotium spray. She was told by the secretary to stop the spray, but the doctor did not tell her to stop it. She wants to know if the doctor wants her to stop it.

## 2019-05-26 ENCOUNTER — Ambulatory Visit: Payer: Medicare Other | Admitting: Family Medicine

## 2019-06-01 ENCOUNTER — Encounter (INDEPENDENT_AMBULATORY_CARE_PROVIDER_SITE_OTHER): Payer: Medicare Other | Admitting: Ophthalmology

## 2019-06-01 ENCOUNTER — Other Ambulatory Visit: Payer: Self-pay

## 2019-06-01 DIAGNOSIS — H33302 Unspecified retinal break, left eye: Secondary | ICD-10-CM | POA: Diagnosis not present

## 2019-06-01 DIAGNOSIS — I10 Essential (primary) hypertension: Secondary | ICD-10-CM | POA: Diagnosis not present

## 2019-06-01 DIAGNOSIS — H43813 Vitreous degeneration, bilateral: Secondary | ICD-10-CM

## 2019-06-01 DIAGNOSIS — H34832 Tributary (branch) retinal vein occlusion, left eye, with macular edema: Secondary | ICD-10-CM

## 2019-06-01 DIAGNOSIS — H35033 Hypertensive retinopathy, bilateral: Secondary | ICD-10-CM

## 2019-06-18 ENCOUNTER — Other Ambulatory Visit: Payer: Self-pay | Admitting: Family Medicine

## 2019-06-29 ENCOUNTER — Other Ambulatory Visit: Payer: Self-pay

## 2019-06-29 ENCOUNTER — Encounter (INDEPENDENT_AMBULATORY_CARE_PROVIDER_SITE_OTHER): Payer: Medicare Other | Admitting: Ophthalmology

## 2019-06-29 DIAGNOSIS — H34832 Tributary (branch) retinal vein occlusion, left eye, with macular edema: Secondary | ICD-10-CM

## 2019-06-29 DIAGNOSIS — H43813 Vitreous degeneration, bilateral: Secondary | ICD-10-CM

## 2019-06-29 DIAGNOSIS — H35033 Hypertensive retinopathy, bilateral: Secondary | ICD-10-CM

## 2019-06-29 DIAGNOSIS — I10 Essential (primary) hypertension: Secondary | ICD-10-CM | POA: Diagnosis not present

## 2019-06-29 DIAGNOSIS — H33302 Unspecified retinal break, left eye: Secondary | ICD-10-CM | POA: Diagnosis not present

## 2019-07-04 NOTE — Progress Notes (Signed)
  Subjective:  Patient ID: Linda Thompson, female    DOB: 09-05-1955,  MRN: [id number]  Chief Complaint  Patient presents with  . Nail Problem    bilateral hallux , bilateral medial. sore. ongoing for 6 days. has tried self trimming but no improvement.   . Diabetes    last A1C 6.23 October 2018    63 y.o. female presents with the above complaint. History confirmed with patient.   Objective:  Physical Exam: warm, good capillary refill, nail exam onychomycosis of the toenails, no trophic changes or ulcerative lesions. DP pulses palpable, PT pulses palpable and protective sensation intact Left Foot: normal exam, no swelling, tenderness, instability; ligaments intact, full range of motion of all ankle/foot joints  Right Foot: normal exam, no swelling, tenderness, instability; ligaments intact, full range of motion of all ankle/foot joints   No images are attached to the encounter.  Assessment:   1. Pain due to onychomycosis of toenails of both feet   2. Diabetes mellitus without complication ([place])      Plan:  Patient was evaluated and treated and all questions answered.  Onychomycosis and Diabetes -Nails palliatively debrided secondary to pain    Procedure: Nail Debridement Rationale: Pain Type of Debridement: manual, sharp debridement. Instrumentation: Nail nipper, rotary burr. Number of Nails: 2     Return if symptoms worsen or fail to improve.

## 2019-07-10 ENCOUNTER — Other Ambulatory Visit: Payer: Self-pay | Admitting: Family Medicine

## 2019-07-27 ENCOUNTER — Other Ambulatory Visit: Payer: Self-pay

## 2019-07-27 ENCOUNTER — Telehealth: Payer: Self-pay

## 2019-07-27 ENCOUNTER — Encounter (INDEPENDENT_AMBULATORY_CARE_PROVIDER_SITE_OTHER): Payer: Medicare Other | Admitting: Ophthalmology

## 2019-07-27 DIAGNOSIS — I10 Essential (primary) hypertension: Secondary | ICD-10-CM | POA: Diagnosis not present

## 2019-07-27 DIAGNOSIS — H34832 Tributary (branch) retinal vein occlusion, left eye, with macular edema: Secondary | ICD-10-CM | POA: Diagnosis not present

## 2019-07-27 DIAGNOSIS — H35033 Hypertensive retinopathy, bilateral: Secondary | ICD-10-CM

## 2019-07-27 DIAGNOSIS — H33302 Unspecified retinal break, left eye: Secondary | ICD-10-CM

## 2019-07-27 DIAGNOSIS — H43813 Vitreous degeneration, bilateral: Secondary | ICD-10-CM

## 2019-07-27 NOTE — Telephone Encounter (Signed)
Left message for pt to return call to triage RN. 

## 2019-07-27 NOTE — Telephone Encounter (Signed)
Patient is calling in regards to speak with a nurse about symptoms she is having.

## 2019-07-28 NOTE — Telephone Encounter (Signed)
Patient returned call

## 2019-07-28 NOTE — Telephone Encounter (Signed)
AEX 07/2018 with DL Cancelled last OV for vaginal itching in 04/2019  Spoke with pt. Pt states having some vaginal "hotness" for about a week. Pt denies vaginal discharge, odor, bleeding, itching, UTI sx, fever, chills. Pt states will continue to use coconut oil daily externally. Pt declines OV today. Pt states will call next week if not better to be seen. Pt agreeable. Advised will review with Dr Sabra Heck and return call if any further recommendations. Pt agreeable.   Routing to Dr Sabra Heck.  Encounter closed.

## 2019-08-12 ENCOUNTER — Telehealth: Payer: Self-pay | Admitting: Family Medicine

## 2019-08-12 NOTE — Telephone Encounter (Signed)
Patient advised to use Ayr nasal gel for dryness.  It can be found over-the-counter at your local drugstore.

## 2019-08-12 NOTE — Telephone Encounter (Signed)
FYI Called pt to check on how she was doing, state that she is doing fine and that she wanted to let Dr Volanda Napoleon know that her nose is getting dry and she noticed some bleeding this morning, pt state that she put some Vaseline and also using nasal spray, pt state that she has not noticed any bleeding again, pt advised to go to UC/ED if she experience any nose bleed over the weekend.

## 2019-08-12 NOTE — Telephone Encounter (Signed)
Pt stated when she spoke to her PCP last her nose was dry and was told to take a Q-tip and Vaseline and rub it around her nose. Pt stated she did that this morning and and one side started bleeding. It is not bleeding now but was told to inform her doctor.   Pt can be reached at [phone]

## 2019-08-15 ENCOUNTER — Other Ambulatory Visit: Payer: Self-pay | Admitting: Family Medicine

## 2019-08-15 ENCOUNTER — Telehealth: Payer: Self-pay

## 2019-08-15 NOTE — Telephone Encounter (Signed)
Spoke with pt verbalized understanding of Dr Volanda Napoleon recommendations.

## 2019-08-15 NOTE — Telephone Encounter (Signed)
Spoke with patient. Patient is requesting an OV for internal vaginal itching. Denies vaginal odor, d/c, redness, bleeding. Has been applying coconut oil with no change in symptoms.   Last AEX 07/21/18 w/ Melvia Heaps, CNM  OV scheduled for 08/18/19 at 11am with Dr. Quincy Simmonds.  Patient will discuss scheduling AEX while in office.   Routing to provider for final review. Patient is agreeable to disposition. Will close encounter.

## 2019-08-15 NOTE — Telephone Encounter (Signed)
Patient left message wanting to speak with nurse regarding making an appointment before this Friday.

## 2019-08-16 ENCOUNTER — Telehealth: Payer: Self-pay | Admitting: Obstetrics and Gynecology

## 2019-08-16 ENCOUNTER — Other Ambulatory Visit: Payer: Self-pay | Admitting: Family Medicine

## 2019-08-16 NOTE — Telephone Encounter (Signed)
Patient would like nurse to call her regarding appointment.

## 2019-08-16 NOTE — Telephone Encounter (Signed)
Spoke with patient. Patient asking if any earlier appts? Offered OV on 7/28 at 4pm, patient declined due to transportation. Patient reports increased itching, denies any other symptoms. Patient will keep OV as scheduled for 7/29 at 11am.   Routing to provider for final review. Patient is agreeable to disposition. Will close encounter.

## 2019-08-17 ENCOUNTER — Ambulatory Visit: Payer: Medicare Other | Admitting: Podiatrist

## 2019-08-18 ENCOUNTER — Other Ambulatory Visit: Payer: Self-pay

## 2019-08-18 ENCOUNTER — Ambulatory Visit (INDEPENDENT_AMBULATORY_CARE_PROVIDER_SITE_OTHER): Payer: Medicare Other | Admitting: Obstetrics and Gynecology

## 2019-08-18 ENCOUNTER — Encounter: Payer: Self-pay | Admitting: Obstetrics and Gynecology

## 2019-08-18 VITALS — BP 120/72 | HR 68 | Resp 14 | Ht 62.0 in | Wt 167.0 lb

## 2019-08-18 DIAGNOSIS — N761 Subacute and chronic vaginitis: Secondary | ICD-10-CM | POA: Diagnosis not present

## 2019-08-18 NOTE — Progress Notes (Signed)
GYNECOLOGY  VISIT   HPI: 64 y.o.   Single  Caucasian  female   [name] with Patient's last menstrual period was 01/21/1992 (approximate).   here for vaginitis. States it is itching.    She states she breaks out on her bottom.   Last seen for vaginitis 10/01/18 and had negative Affirm.   She uses Desitin, hydrocortisone cream, coconut oil and Myconizole powder.   She uses baby wipes for cleansing.   She takes a shower nightly and puts on the coconut oil.  She does wear Depends when she leaves the house.  Uses a gown instead of tight fitting clothing.   Had dental infection last month and had a root canal on 08/01/19. She was treated with antibiotics.  Last A1C in Epic is 6.3.  GYNECOLOGIC HISTORY: Patient's last menstrual period was 01/21/1992 (approximate). Contraception:  Hysterectomy Menopausal hormone therapy:  none Last mammogram:  10/20/18 BIRADS 1 negative/density a Last pap smear:   01/16/14 Negative        OB History    Gravida  0   Para  0   Term  0   Preterm  0   AB  0   Living  0     SAB  0   TAB  0   Ectopic  0   Multiple  0   Live Births                 Patient Active Problem List   Diagnosis Date Noted  . Pain due to onychomycosis of toenails of both feet 07/07/2018  . Diabetes mellitus without complication ([place]) 51/02/5850  . Murmur 02/17/2018  . Hypertension associated with diabetes ([place]) 04/20/2017  . Hyperlipidemia associated with type 2 diabetes mellitus ([place]) 04/20/2017  . Arthritis 12/03/2015  . Multiple gastric polyps 08/30/2015  . Sarcoidosis 10/27/2014  . Morbid obesity ([place]) 05/05/2011  . Irritable bowel syndrome 03/05/2010  . Diabetes ([place]) 08/13/2007  . Allergic rhinitis 09/23/2006  . Moderate intellectual disabilities 03/11/2006  . Unspecified glaucoma 03/11/2006  . Gastroesophageal reflux disease 03/11/2006    Past Medical History:  Diagnosis Date  . Allergic rhinitis   . Allergy   . Anemia   . Chronic low  back pain   . DDD (degenerative disc disease), lumbar   . GERD (gastroesophageal reflux disease)    w/ hx diaphragmatic hernia  . Glaucoma   . GLAUCOMA NOS 03/11/2006   Qualifier: Diagnosis of  By: Diona Browner MD, Amy    . Heart murmur   . Hx of fracture    R arm, foot  . Hyperlipemia   . Hypertension   . IBS (irritable bowel syndrome)   . Low back pain 11/22/2012  . Meningioma 12/20/2001   Repeat MR from 2017 - radiology feels not a meningioma, but rather "Thickening of the anterior falx appears to be related to degenerative ossification and not meningioma." HCPOA opted not to do repeat imaging with CT for the possible hemangioma and declined further evaluation of this.  . Meningioma ([place])    ant falx, stable on prior imaging  . Moderate intellectual disabilities   . Murmur   . Obesity   . Osteoarthrosis, unspecified whether generalized or localized, ankle and foot   . Sarcoidosis    difuse bony lesions and LDA, dx by biopsy in 2016  . Transaminitis   . Type II or unspecified type diabetes mellitus without mention of complication, not stated as uncontrolled     Past Surgical History:  Procedure Laterality Date  . CATARACT EXTRACTION Bilateral    Dr Katy Fitch  . COLONOSCOPY  04/15/2011  . ELBOW SURGERY     right elbow  . LIPOMA EXCISION Left [id number]   posterior left axilla  . TOTAL ABDOMINAL HYSTERECTOMY  03/05/1992   leiomyoma and cellular leiomyoma    Current Outpatient Medications  Medication Sig Dispense Refill  . acetaminophen (TYLENOL) 500 MG tablet Take 500 mg by mouth every 6 (six) hours as needed for mild pain.     Marland Kitchen Besifloxacin HCl (BESIVANCE) 0.6 % SUSP Apply to eye. 4 times each day for 2 days    . diclofenac sodium (VOLTAREN) 1 % GEL APPLY 2 GRAMS TO AFFECTED AREA 4 TIMES A DAY (Patient taking differently: Apply 2 g topically 4 (four) times daily. ) 100 g 1  . diltiazem (CARDIZEM CD) 240 MG 24 hr capsule TAKE 1 CAPSULE BY MOUTH EVERY DAY 90 capsule 0  . ferrous  sulfate 325 (65 FE) MG tablet Take 325 mg by mouth 3 (three) times daily with meals.     . fluticasone (FLONASE) 50 MCG/ACT nasal spray PLACE 2 SPRAYS INTO BOTH NOSTRILS AT BEDTIME. 48 mL 1  . hydrochlorothiazide (MICROZIDE) 12.5 MG capsule TAKE 1 CAPSULE BY MOUTH EVERY DAY 90 capsule 0  . hydrocortisone 2.5 % ointment APPLY TO AFFECTED AREA TWICE DAILY MONDAY THRU FRIDAY ONLY    . ipratropium (ATROVENT) 0.06 % nasal spray SPRAY 2 SPRAYS IN EACH NOSTRIL 4 TIMES A DAY 15 mL 1  . ketoconazole (NIZORAL) 2 % shampoo APPLY TO AFFECTED AREA TWICE A WEEK AS DIRECTED 120 mL 1  . latanoprost (XALATAN) 0.005 % ophthalmic solution Place 1 drop into both eyes at bedtime.     Marland Kitchen loratadine (CLARITIN) 10 MG tablet Take 1 tablet (10 mg total) by mouth daily. 90 tablet 1  . metFORMIN (GLUCOPHAGE) 1000 MG tablet TAKE 1 TABLET TWICE A DAY WITH A MEAL 180 tablet 0  . mometasone (ELOCON) 0.1 % ointment APPLY A SMALL AMOUNT TO AFFECTED AREA AS DIRECTED    . omeprazole (PRILOSEC) 20 MG capsule TAKE 1 CAPSULE (20 MG TOTAL) BY MOUTH 2 (TWO) TIMES DAILY. 180 capsule 1  . pravastatin (PRAVACHOL) 40 MG tablet TAKE 1 TABLET BY MOUTH EVERY DAY IN THE EVENING 90 tablet 0  . vitamin B-12 (CYANOCOBALAMIN) 500 MCG tablet Take 500 mcg by mouth daily.    . Vitamin D, Cholecalciferol, 25 MCG (1000 UT) CAPS Take 1,000 Units by mouth daily.    . Vitamins-Lipotropics ([employer] INNER EAR PLUS PO) Take 2 tablets by mouth 2 (two) times daily.      No current facility-administered medications for this visit.     ALLERGIES: Ace inhibitors, Amoxicillin-pot clavulanate, Augmentin [amoxicillin-pot clavulanate], Azithromycin, Ceftin [cefuroxime axetil], Cefuroxime, Cyclobenzaprine hcl, Flexeril [cyclobenzaprine], Flonase [fluticasone propionate], and Ibuprofen  Family History  Problem Relation Age of Onset  . Diabetes Mother   . Stroke Mother   . Hypertension Mother   . Stroke Father   . Diabetes Maternal Aunt   . Heart disease Maternal Aunt          (had pacemaker)  . Diabetes Maternal Grandmother   . Stroke Maternal Grandmother   . Hypertension Maternal Grandmother   . Colon cancer Neg Hx     Social History   Socioeconomic History  . Marital status: Single    Spouse name: Not on file  . Number of children: 0  . Years of education: Not on file  . Highest  education level: Not on file  Occupational History  . Occupation: Disabled (from arm fracture)  Tobacco Use  . Smoking status: Never Smoker  . Smokeless tobacco: Never Used  Vaping Use  . Vaping Use: Never used  Substance and Sexual Activity  . Alcohol use: No    Alcohol/week: 0.0 standard drinks  . Drug use: No  . Sexual activity: Never    Birth control/protection: Surgical    Comment: Hyst--TAH--unsure if has ovaries  Other Topics Concern  . Not on file  Social History Narrative         Currently-disability for arm fracture      Mental Retardation, but is able to drive to places she is comfortable with and prepare her own meals and manage most of her own affairs.      [name] (neighbor) is POA/HCPOA      Reports she gets regular exercise and tries to eat healthy   Social Determinants of Health   Financial Resource Strain:   . Difficulty of Paying Living Expenses:   Food Insecurity:   . Worried About Charity fundraiser in the Last Year:   . Arboriculturist in the Last Year:   Transportation Needs:   . Film/video editor (Medical):   Marland Kitchen Lack of Transportation (Non-Medical):   Physical Activity:   . Days of Exercise per Week:   . Minutes of Exercise per Session:   Stress:   . Feeling of Stress :   Social Connections:   . Frequency of Communication with Friends and Family:   . Frequency of Social Gatherings with Friends and Family:   . Attends Religious Services:   . Active Member of Clubs or Organizations:   . Attends Archivist Meetings:   Marland Kitchen Marital Status:   Intimate Partner Violence:   . Fear of Current or Ex-Partner:    . Emotionally Abused:   Marland Kitchen Physically Abused:   . Sexually Abused:     Review of Systems  PHYSICAL EXAMINATION:    BP 120/72 (BP Location: Left Arm, Patient Position: Sitting, Cuff Size: Normal)   Pulse 68   Resp 14   Ht 5\' 2"  (1.575 m)   Wt 167 lb (75.8 kg)   LMP 01/21/1992 (Approximate)   BMI 30.54 kg/m     General appearance: alert, cooperative and appears stated age  Pelvic: External genitalia:  no lesions              Urethra:  normal appearing urethra with no masses, tenderness or lesions              Bartholins and Skenes: normal                 Vagina: normal appearing vagina with normal color and discharge, no lesions              Cervix: no lesions                Bimanual Exam:  Uterus:  normal size, contour, position, consistency, mobility, non-tender              Adnexa: no mass, fullness, tenderness              Anus:  White cream applied around anal opening.   Chaperone was present for exam.  ASSESSMENT  Chronic vaginitis.  Skin in good condition today.  Recent abx.   PLAN  Affirm testing.  I agree with the nystatin powder during the day,  hydrocortisone ointment at night and the Desitin for the anal opening.  Fu prn.

## 2019-08-19 LAB — VAGINITIS/VAGINOSIS, DNA PROBE
Candida Species: NEGATIVE
Gardnerella vaginalis: NEGATIVE
Trichomonas vaginosis: NEGATIVE

## 2019-08-24 ENCOUNTER — Telehealth: Payer: Self-pay | Admitting: Obstetrics and Gynecology

## 2019-08-24 ENCOUNTER — Other Ambulatory Visit: Payer: Self-pay | Admitting: Family Medicine

## 2019-08-24 DIAGNOSIS — H401131 Primary open-angle glaucoma, bilateral, mild stage: Secondary | ICD-10-CM | POA: Diagnosis not present

## 2019-08-24 DIAGNOSIS — E113212 Type 2 diabetes mellitus with mild nonproliferative diabetic retinopathy with macular edema, left eye: Secondary | ICD-10-CM | POA: Diagnosis not present

## 2019-08-24 DIAGNOSIS — H04123 Dry eye syndrome of bilateral lacrimal glands: Secondary | ICD-10-CM | POA: Diagnosis not present

## 2019-08-24 DIAGNOSIS — Z961 Presence of intraocular lens: Secondary | ICD-10-CM | POA: Diagnosis not present

## 2019-08-24 LAB — HM DIABETES EYE EXAM

## 2019-08-24 NOTE — Telephone Encounter (Signed)
Spoke with pt. Pt states wanting to know if can use Dial soap instead of Dove. Pt advised to continue using Dove soap if available. Pt agreeable and verbalized understanding.   Encounter closed.

## 2019-08-24 NOTE — Telephone Encounter (Signed)
Patient says Ms Manon Hilding told her to use Dove soap. She is about to run out and want to know if she can use Dial.

## 2019-08-26 ENCOUNTER — Encounter: Payer: Self-pay | Admitting: Family Medicine

## 2019-08-31 ENCOUNTER — Other Ambulatory Visit: Payer: Self-pay

## 2019-08-31 ENCOUNTER — Encounter (INDEPENDENT_AMBULATORY_CARE_PROVIDER_SITE_OTHER): Payer: Medicare Other | Admitting: Ophthalmology

## 2019-08-31 DIAGNOSIS — H33302 Unspecified retinal break, left eye: Secondary | ICD-10-CM | POA: Diagnosis not present

## 2019-08-31 DIAGNOSIS — H34832 Tributary (branch) retinal vein occlusion, left eye, with macular edema: Secondary | ICD-10-CM | POA: Diagnosis not present

## 2019-08-31 DIAGNOSIS — H35033 Hypertensive retinopathy, bilateral: Secondary | ICD-10-CM

## 2019-08-31 DIAGNOSIS — I10 Essential (primary) hypertension: Secondary | ICD-10-CM | POA: Diagnosis not present

## 2019-08-31 DIAGNOSIS — H43813 Vitreous degeneration, bilateral: Secondary | ICD-10-CM | POA: Diagnosis not present

## 2019-09-07 ENCOUNTER — Ambulatory Visit (INDEPENDENT_AMBULATORY_CARE_PROVIDER_SITE_OTHER): Payer: Medicare Other

## 2019-09-07 ENCOUNTER — Other Ambulatory Visit: Payer: Self-pay

## 2019-09-07 DIAGNOSIS — Z0000 Encounter for general adult medical examination without abnormal findings: Secondary | ICD-10-CM | POA: Diagnosis not present

## 2019-09-07 DIAGNOSIS — Z780 Asymptomatic menopausal state: Secondary | ICD-10-CM

## 2019-09-07 NOTE — Progress Notes (Signed)
Subjective:   Linda Thompson is a 64 y.o. female who presents for Medicare Annual (Subsequent) preventive examination.   I connected with Malayah Demuro today by telephone and verified that I am speaking with the correct person using two identifiers. Location patient: home Location provider: work Persons participating in the virtual visit: patient, provider.   I discussed the limitations, risks, security and privacy concerns of performing an evaluation and management service by telephone and the availability of in person appointments. I also discussed with the patient that there may be a patient responsible charge related to this service. The patient expressed understanding and verbally consented to this telephonic visit.    Interactive audio and video telecommunications were attempted between this provider and patient, however failed, due to patient having technical difficulties OR patient did not have access to video capability.  We continued and completed visit with audio only.     Review of Systems    N/A Cardiac Risk Factors include: advanced age (>61men, >16 women);diabetes mellitus;dyslipidemia;hypertension     Objective:    Today's Vitals   There is no height or weight on file to calculate BMI.  Advanced Directives 09/07/2019 12/22/2017 11/29/2017 12/19/2016 02/12/2016 01/03/2016 12/11/2015  Does Patient Have a Medical Advance Directive? Yes Yes Yes Yes No Yes No  Type of Advance Directive Living will - Living will;Healthcare Power of [place];Living will;Out of facility DNR (pink MOST or yellow form) [place];Living will  Does patient want to make changes to medical advance directive? No - Patient declined - No - Patient declined - - No - Patient declined No - Patient declined  Copy of [place] in Chart? - - No - copy requested - - Yes -  Would patient like information on creating a medical advance  directive? - - - - No - Patient declined - No - Patient declined    Current Medications (verified) Outpatient Encounter Medications as of 09/07/2019  Medication Sig  . acetaminophen (TYLENOL) 500 MG tablet Take 500 mg by mouth every 6 (six) hours as needed for mild pain.   Marland Kitchen Besifloxacin HCl (BESIVANCE) 0.6 % SUSP Apply to eye. 4 times each day for 2 days  . diclofenac sodium (VOLTAREN) 1 % GEL APPLY 2 GRAMS TO AFFECTED AREA 4 TIMES A DAY (Patient taking differently: Apply 2 g topically 4 (four) times daily. )  . diltiazem (CARDIZEM CD) 240 MG 24 hr capsule TAKE 1 CAPSULE BY MOUTH EVERY DAY  . ferrous sulfate 325 (65 FE) MG tablet Take 325 mg by mouth 3 (three) times daily with meals.   . fluticasone (FLONASE) 50 MCG/ACT nasal spray PLACE 2 SPRAYS INTO BOTH NOSTRILS AT BEDTIME.  . hydrochlorothiazide (MICROZIDE) 12.5 MG capsule TAKE 1 CAPSULE BY MOUTH EVERY DAY  . hydrocortisone 2.5 % ointment APPLY TO AFFECTED AREA TWICE DAILY MONDAY THRU FRIDAY ONLY  . ipratropium (ATROVENT) 0.06 % nasal spray SPRAY 2 SPRAYS IN EACH NOSTRIL 4 TIMES A DAY  . ketoconazole (NIZORAL) 2 % shampoo APPLY TO AFFECTED AREA TWICE A WEEK AS DIRECTED  . latanoprost (XALATAN) 0.005 % ophthalmic solution Place 1 drop into both eyes at bedtime.   Marland Kitchen loratadine (CLARITIN) 10 MG tablet Take 1 tablet (10 mg total) by mouth daily.  . metFORMIN (GLUCOPHAGE) 1000 MG tablet TAKE 1 TABLET TWICE A DAY WITH A MEAL  . mometasone (ELOCON) 0.1 % ointment APPLY A SMALL AMOUNT TO AFFECTED AREA AS DIRECTED  .  omeprazole (PRILOSEC) 20 MG capsule TAKE 1 CAPSULE (20 MG TOTAL) BY MOUTH 2 (TWO) TIMES DAILY.  . pravastatin (PRAVACHOL) 40 MG tablet TAKE 1 TABLET BY MOUTH EVERY DAY IN THE EVENING  . vitamin B-12 (CYANOCOBALAMIN) 500 MCG tablet Take 500 mcg by mouth daily.  . Vitamin D, Cholecalciferol, 25 MCG (1000 UT) CAPS Take 1,000 Units by mouth daily.  . Vitamins-Lipotropics ([facility] INNER EAR PLUS PO) Take 2 tablets by mouth 2 (two) times daily.    . [DISCONTINUED] diltiazem (TIAZAC) 240 MG 24 hr capsule Take 1 capsule (240 mg total) by mouth daily.   No facility-administered encounter medications on file as of 09/07/2019.    Allergies (verified) Ace inhibitors, Amoxicillin-pot clavulanate, Augmentin [amoxicillin-pot clavulanate], Azithromycin, Ceftin [cefuroxime axetil], Cefuroxime, Cyclobenzaprine hcl, Flexeril [cyclobenzaprine], Flonase [fluticasone propionate], and Ibuprofen   History: Past Medical History:  Diagnosis Date  . Allergic rhinitis   . Allergy   . Anemia   . Chronic low back pain   . DDD (degenerative disc disease), lumbar   . GERD (gastroesophageal reflux disease)    w/ hx diaphragmatic hernia  . Glaucoma   . GLAUCOMA NOS 03/11/2006   Qualifier: Diagnosis of  By: Diona Browner MD, Amy    . Heart murmur   . Hx of fracture    R arm, foot  . Hyperlipemia   . Hypertension   . IBS (irritable bowel syndrome)   . Low back pain 11/22/2012  . Meningioma 12/20/2001   Repeat MR from 2017 - radiology feels not a meningioma, but rather "Thickening of the anterior falx appears to be related to degenerative ossification and not meningioma." HCPOA opted not to do repeat imaging with CT for the possible hemangioma and declined further evaluation of this.  . Meningioma ([place])    ant falx, stable on prior imaging  . Moderate intellectual disabilities   . Murmur   . Obesity   . Osteoarthrosis, unspecified whether generalized or localized, ankle and foot   . Sarcoidosis    difuse bony lesions and LDA, dx by biopsy in 2016  . Transaminitis   . Type II or unspecified type diabetes mellitus without mention of complication, not stated as uncontrolled    Past Surgical History:  Procedure Laterality Date  . CATARACT EXTRACTION Bilateral    Dr Katy Fitch  . COLONOSCOPY  04/15/2011  . ELBOW SURGERY     right elbow  . LIPOMA EXCISION Left [id number]   posterior left axilla  . TOTAL ABDOMINAL HYSTERECTOMY  03/05/1992   leiomyoma and  cellular leiomyoma   Family History  Problem Relation Age of Onset  . Diabetes Mother   . Stroke Mother   . Hypertension Mother   . Stroke Father   . Diabetes Maternal Aunt   . Heart disease Maternal Aunt         (had pacemaker)  . Diabetes Maternal Grandmother   . Stroke Maternal Grandmother   . Hypertension Maternal Grandmother   . Colon cancer Neg Hx    Social History   Socioeconomic History  . Marital status: Single    Spouse name: Not on file  . Number of children: 0  . Years of education: Not on file  . Highest education level: Not on file  Occupational History  . Occupation: Disabled (from arm fracture)  Tobacco Use  . Smoking status: Never Smoker  . Smokeless tobacco: Never Used  Vaping Use  . Vaping Use: Never used  Substance and Sexual Activity  . Alcohol use: No  Alcohol/week: 0.0 standard drinks  . Drug use: No  . Sexual activity: Never    Birth control/protection: Surgical    Comment: Hyst--TAH--unsure if has ovaries  Other Topics Concern  . Not on file  Social History Narrative         Currently-disability for arm fracture      Mental Retardation, but is able to drive to places she is comfortable with and prepare her own meals and manage most of her own affairs.      [name] (neighbor) is POA/HCPOA      Reports she gets regular exercise and tries to eat healthy   Social Determinants of Health   Financial Resource Strain: Low Risk   . Difficulty of Paying Living Expenses: Not hard at all  Food Insecurity: No Food Insecurity  . Worried About Charity fundraiser in the Last Year: Never true  . Ran Out of Food in the Last Year: Never true  Transportation Needs: No Transportation Needs  . Lack of Transportation (Medical): No  . Lack of Transportation (Non-Medical): No  Physical Activity: Inactive  . Days of Exercise per Week: 0 days  . Minutes of Exercise per Session: 0 min  Stress: No Stress Concern Present  . Feeling of Stress : Not at  all  Social Connections: Moderately Isolated  . Frequency of Communication with Friends and Family: Once a week  . Frequency of Social Gatherings with Friends and Family: More than three times a week  . Attends Religious Services: More than 4 times per year  . Active Member of Clubs or Organizations: No  . Attends Archivist Meetings: Never  . Marital Status: Never married    Tobacco Counseling Counseling given: Not Answered   Clinical Intake:  Pre-visit preparation completed: Yes  Pain : No/denies pain     Nutritional Risks: None Diabetes: Yes (Does not check her blood sugars) CBG done?: No Did pt. bring in CBG monitor from home?: No  How often do you need to have someone help you when you read instructions, pamphlets, or other written materials from your doctor or pharmacy?: 1 - Never What is the last grade level you completed in school?: 12th grade  Diabetic?Yes  Interpreter Needed?: No  Information entered by :: SCrews, LPN   Activities of Daily Living In your present state of health, do you have any difficulty performing the following activities: 09/07/2019  Hearing? N  Vision? N  Difficulty concentrating or making decisions? N  Walking or climbing stairs? N  Dressing or bathing? N  Doing errands, shopping? Y  Comment unable to drive  Preparing Food and eating ? N  Using the Toilet? N  In the past six months, have you accidently leaked urine? N  Do you have problems with loss of bowel control? N  Managing your Medications? N  Managing your Finances? N  Some recent data might be hidden    Patient Care Team: [name], MD as PCP - General (Family Medicine)  Indicate any recent Medical Services you may have received from other than [facility] providers in the past year (date may be approximate).     Assessment:   This is a routine wellness examination for [place].  Hearing/Vision screen  Hearing Screening   '125Hz'$  $Remo'250Hz'MGMnO$'500Hz'$'1000Hz'$'2000Hz'$   '3000Hz'$  $Remov'4000Hz'VotjSG$'6000Hz'$'8000Hz'$   Right ear:           Left ear:           Vision Screening  Comments: Patient gets eye exams yearly and gets injections in eyes every month   Dietary issues and exercise activities discussed: Current Exercise Habits: The patient does not participate in regular exercise at present  Goals    . DIET - INCREASE WATER INTAKE     Patient will drink 64 ounces of water per day     . Exercise 150 min/wk Moderate Activity     Manufacturing engineer of Services Cost  A Matter of Balance Class locations vary. Call [place] on Aging for more information.  [email or web address] [phone] 8-Session program addressing the fear of falling and increasing activity levels of older adults Free to minimal cost  A.C.T. By [employer] [address], [place], [place] [address].  BetaBlues.dk [phone]  Personal training, gym, classes including Silver Sneakers* and ACTion for Aging Adults Fee-based  A.H.O.Y. (Add Health to [place]) Airs on Time [employer] 13, M-F at [place]: [employer],  [place] [facility] [place],  [place], [place] [facility], [address] [facility], [place], [place], [place] [address]  [place] Location: Sharrell Ku. [employer] [place]      [phone]  [phone]  [phone]  [phone]  [phone]  [phone]  [phone]  [phone]  [phone]  [phone]    [phone] A total-body conditioning class for adults 17 and older; designed to increase muscular strength, endurance, range of movement, flexibility, balance, agility and coordination  Free  [facility] [place], [place] [address] [place]      [address]      [phone]      Pilate's class for individualsreturning to exercise after an injury, before or after surgery or for individuals with complex musculoskeletal issues; designed to improve strength, balance , flexibility      $15/class  [facility] [address] [place], [place] [address] [email or web address] [place] classes for beginners to advanced [place], [place] [address] [email or web address] [place] Chair Exercises Free, ages 58 and older; Ages 19-59 fee based  Marvia Pickles, [facility] [address] [place], [place] [address] [email or web address] .[email or web address] Nims [phone]  A.H.O.Y. Tai Chi Fee-based Donation based or free  [place] Class locations vary.  Call or email Angela Burke or view website for more information. [email or web address] [email or web address] [phone] Ongoing classes at local YMCAs and gyms Fee-based  Silver Sneakers A.C.T. By [place] [employer]: [place] [phone]  [phone] Classes designed for older adults who want to improve their strength, flexibility, balance and endurance.   Silver sneakers is covered by some insurance plans and  includes a fitness center membership at participating locations. Find out more by calling [phone] or visiting [email or web address] Covered by  some insurance plans  [facility] [place] [phone] A.H.O.Y., fitness room, personal training, fitness classes for injury prevention, strength, balance, flexibility, water fitness classes Ages 55+: $88 for 6 months; Ages 109-54: $62 for 6 months  Tai Chi for Everybody [facility] [address] [place], [place] [address] [email or web address] [phone] Tai Chi classes for beginners to advanced; geared for seniors Donation Based      [employer] ([place] Others Participate in Exercise     Loyal Gambler. Rosana Hoes, PhD, [place] [email or web address] [place]     [phone]     A comprehensive fitness program for adults.  The program paris senior-level undergraduates Kinesiology students with adults who desire to learn how to exercise safely.  Includes a structural exercise class focusing on functional fitnesss     $100/semester in fall and spring; $75 in summer (no trainers)    *Silver Sneakers is covered by some Personal assistant and includes a  Radio producer at participating locations.  Find out more by calling [phone] or visiting [email or web address]  For additional health and human services resources for senior adults, please contact SeniorLine at [phone] in [place] and [place] at [phone] in all other areas.    . patient     Will discuss Maintenance with PT  Will discuss how to create safe toning exercise; sitter exercise and plan the time of day and what you will use  Work on the number of times you are exercising; and limits; time frames; 15 min or 30 min   You may want to practice steps     . Patient Stated     Keeps walking and moving       Depression Screen PHQ 2/9 Scores 09/07/2019 12/22/2017 12/19/2016 03/21/2016 02/21/2016 01/30/2016 01/28/2016    PHQ - 2 Score 0 0 0 0 0 0 0  PHQ- 9 Score 0 - - - - - -    Fall Risk Fall Risk  09/07/2019 12/22/2017 12/19/2016 12/19/2016 03/21/2016  Falls in the past year? 0 0 - Yes Yes  Comment - - - - Falls: (2)  12/11/15 and 02/12/16  Number falls in past yr: 0 - - 2 or more 2 or more  Injury with Fall? 0 - - - Yes  Risk Factor Category  - - - - High Fall Risk  Risk for fall due to : Impaired balance/gait - Impaired balance/gait - History of fall(s);Impaired balance/gait  Follow up Falls evaluation completed;Falls prevention discussed - Education provided Education provided Falls evaluation completed;Education provided;Falls prevention discussed  Comment - - - sprained ankle  -    Any stairs in or around the home? Yes  If so, are there any without handrails? No  Home free of loose throw rugs in walkways, pet beds, electrical cords, etc? Yes  Adequate lighting in your home to reduce risk of falls? Yes   ASSISTIVE DEVICES UTILIZED TO PREVENT FALLS:  Life alert? No  Use of a cane, walker or w/c? Yes  Grab bars in the bathroom? Yes  Shower chair or bench in shower? Yes  Elevated toilet seat or a handicapped toilet? No     Cognitive Function: MMSE - Mini Mental State Exam 12/22/2017 12/19/2016 12/11/2015  Not completed: (No Data) (No Data) (No Data)     6CIT Screen 09/07/2019  What Year? 0 points  What month? 0 points  What time? 0 points  Count back from 20 2 points  Months in reverse 0 points  Repeat phrase 2 points  Total Score 4    Immunizations Immunization History  Administered Date(s) Administered  . Influenza Split 09/19/2010  . Influenza Whole 11/12/2004, 10/21/2006, 10/18/2007, 10/19/2008, 10/25/2009  . Influenza,inj,Quad PF,6+ Mos 09/17/2012, 09/19/2013, 09/13/2014, 09/06/2016, 09/14/2017, 09/17/2018  . Influenza-Unspecified 08/31/2015, 09/06/2016, 09/14/2017  . PFIZER SARS-COV-2 Vaccination 02/21/2019, 03/22/2019  . Pneumococcal Polysaccharide-23 03/10/2002, 02/08/2009   . Td 03/10/2002, 08/08/2014  . Zoster 08/08/2014  . Zoster Recombinat (Shingrix) 09/22/2017, 12/21/2017    TDAP status: Up to date Flu Vaccine status: Up to date Pneumococcal vaccine status: Declined,  Education has been provided regarding the importance of this vaccine but patient still declined. Advised may receive this vaccine at local pharmacy or Health Dept. Aware to provide a copy of the vaccination record if obtained from local pharmacy or Health Dept. Verbalized acceptance and understanding.  Covid-19 vaccine status: Completed vaccines  Qualifies for Shingles Vaccine? Yes   Zostavax completed Yes   Shingrix Completed?: Yes  Screening Tests Health Maintenance  Topic Date Due  . COLONOSCOPY  04/18/2018  . URINE MICROALBUMIN  11/27/2018  . PAP SMEAR-Modifier  01/17/2019  . HEMOGLOBIN A1C  05/11/2019  . FOOT EXAM  07/07/2019  . DEXA SCAN  Never done  . PNA vac Low Risk Adult (1 of 2 - PCV13) 08/02/2019  . INFLUENZA VACCINE  08/21/2019  . MAMMOGRAM  10/20/2019  . OPHTHALMOLOGY EXAM  08/23/2020  . TETANUS/TDAP  08/07/2024  . COVID-19 Vaccine  Completed  . Hepatitis C Screening  Completed  . HIV Screening  Completed    Health Maintenance  Health Maintenance Due  Topic Date Due  . COLONOSCOPY  04/18/2018  . URINE MICROALBUMIN  11/27/2018  . PAP SMEAR-Modifier  01/17/2019  . HEMOGLOBIN A1C  05/11/2019  . FOOT EXAM  07/07/2019  . DEXA SCAN  Never done  . PNA vac Low Risk Adult (1 of 2 - PCV13) 08/02/2019  . INFLUENZA VACCINE  08/21/2019    Colorectal cancer screening: Completed 04/18/2015. Repeat every 10 years Mammogram status: Completed 10/20/2018. Repeat every year Bone Density status: Ordered 09/07/2019. Pt provided with contact info and advised to call to schedule appt.  Lung Cancer Screening: (Low Dose CT Chest recommended if Age 2-80 years, 30 pack-year currently smoking OR have quit w/in 15years.) does not qualify.   Lung Cancer Screening Referral:  N/A  Additional Screening:  Hepatitis C Screening: does qualify; Completed 04/16/2015  Vision Screening: Recommended annual ophthalmology exams for early detection of glaucoma and other disorders of the eye. Is the patient up to date with their annual eye exam?  Yes  Who is the provider or what is the name of the office in which the patient attends annual eye exams? Dr. Katy Fitch If pt is not established with a provider, would they like to be referred to a provider to establish care? No .   Dental Screening: Recommended annual dental exams for proper oral hygiene  Community Resource Referral / Chronic Care Management: CRR required this visit?  No   CCM required this visit?  No      Plan:     I have personally reviewed and noted the following in the patient's chart:   . Medical and social history . Use of alcohol, tobacco or illicit drugs  . Current medications and supplements . Functional ability and status . Nutritional status . Physical activity . Advanced directives . List of other physicians . Hospitalizations, surgeries, and ER visits in previous 12 months . Vitals . Screenings to  include cognitive, depression, and falls . Referrals and appointments  In addition, I have reviewed and discussed with patient certain preventive protocols, quality metrics, and best practice recommendations. A written personalized care plan for preventive services as well as general preventive health recommendations were provided to patient.     Ofilia Neas, LPN   [id number]   Nurse Notes: None

## 2019-09-07 NOTE — Patient Instructions (Signed)
Linda Thompson , Thank you for taking time to come for your Medicare Wellness Visit. I appreciate your ongoing commitment to your health goals. Please review the following plan we discussed and let me know if I can assist you in the future.   Screening recommendations/referrals: Colonoscopy: Currently due, please contact your gastroenterology office and reschedule your appointment Mammogram: Up to date, next due 10/20/2019 Bone Density: Currently due, orders placed this visit  Recommended yearly ophthalmology/optometry visit for glaucoma screening and checkup Recommended yearly dental visit for hygiene and checkup  Vaccinations: Influenza vaccine: Up to date, next due this flu season 2021 Pneumococcal vaccine: Prevnar 13 currently due, you may receive this at your next office visit Tdap vaccine: Up to date, next due 08/07/2024 Shingles vaccine: Completed series    Advanced directives: please bring a copy of your advanced directives to your next office visit so that we may scan a copy into your chart.  Conditions/risks identified: None   Next appointment: 09/14/2019 @ 11:00 am with Dr. Volanda Napoleon   Preventive Care 65 Years and Older, Female Preventive care refers to lifestyle choices and visits with your health care provider that can promote health and wellness. What does preventive care include?  A yearly physical exam. This is also called an annual well check.  Dental exams once or twice a year.  Routine eye exams. Ask your health care provider how often you should have your eyes checked.  Personal lifestyle choices, including:  Daily care of your teeth and gums.  Regular physical activity.  Eating a healthy diet.  Avoiding tobacco and drug use.  Limiting alcohol use.  Practicing safe sex.  Taking low-dose aspirin every day.  Taking vitamin and mineral supplements as recommended by your health care provider. What happens during an annual well check? The services and  screenings done by your health care provider during your annual well check will depend on your age, overall health, lifestyle risk factors, and family history of disease. Counseling  Your health care provider may ask you questions about your:  Alcohol use.  Tobacco use.  Drug use.  Emotional well-being.  Home and relationship well-being.  Sexual activity.  Eating habits.  History of falls.  Memory and ability to understand (cognition).  Work and work Statistician.  Reproductive health. Screening  You may have the following tests or measurements:  Height, weight, and BMI.  Blood pressure.  Lipid and cholesterol levels. These may be checked every 5 years, or more frequently if you are over 33 years old.  Skin check.  Lung cancer screening. You may have this screening every year starting at age 37 if you have a 30-pack-year history of smoking and currently smoke or have quit within the past 15 years.  Fecal occult blood test (FOBT) of the stool. You may have this test every year starting at age 13.  Flexible sigmoidoscopy or colonoscopy. You may have a sigmoidoscopy every 5 years or a colonoscopy every 10 years starting at age 89.  Hepatitis C blood test.  Hepatitis B blood test.  Sexually transmitted disease (STD) testing.  Diabetes screening. This is done by checking your blood sugar (glucose) after you have not eaten for a while (fasting). You may have this done every 1-3 years.  Bone density scan. This is done to screen for osteoporosis. You may have this done starting at age 33.  Mammogram. This may be done every 1-2 years. Talk to your health care provider about how often you should have regular  mammograms. Talk with your health care provider about your test results, treatment options, and if necessary, the need for more tests. Vaccines  Your health care provider may recommend certain vaccines, such as:  Influenza vaccine. This is recommended every  year.  Tetanus, diphtheria, and acellular pertussis (Tdap, Td) vaccine. You may need a Td booster every 10 years.  Zoster vaccine. You may need this after age 85.  Pneumococcal 13-valent conjugate (PCV13) vaccine. One dose is recommended after age 48.  Pneumococcal polysaccharide (PPSV23) vaccine. One dose is recommended after age 40. Talk to your health care provider about which screenings and vaccines you need and how often you need them. This information is not intended to replace advice given to you by your health care provider. Make sure you discuss any questions you have with your health care provider. Document Released: 02/02/2015 Document Revised: 09/26/2015 Document Reviewed: 11/07/2014 Elsevier Interactive Patient Education  2017 [place] Prevention in the Home Falls can cause injuries. They can happen to people of all ages. There are many things you can do to make your home safe and to help prevent falls. What can I do on the outside of my home?  Regularly fix the edges of walkways and driveways and fix any cracks.  Remove anything that might make you trip as you walk through a door, such as a raised step or threshold.  Trim any bushes or trees on the path to your home.  Use bright outdoor lighting.  Clear any walking paths of anything that might make someone trip, such as rocks or tools.  Regularly check to see if handrails are loose or broken. Make sure that both sides of any steps have handrails.  Any raised decks and porches should have guardrails on the edges.  Have any leaves, snow, or ice cleared regularly.  Use sand or salt on walking paths during winter.  Clean up any spills in your garage right away. This includes oil or grease spills. What can I do in the bathroom?  Use night lights.  Install grab bars by the toilet and in the tub and shower. Do not use towel bars as grab bars.  Use non-skid mats or decals in the tub or shower.  If you  need to sit down in the shower, use a plastic, non-slip stool.  Keep the floor dry. Clean up any water that spills on the floor as soon as it happens.  Remove soap buildup in the tub or shower regularly.  Attach bath mats securely with double-sided non-slip rug tape.  Do not have throw rugs and other things on the floor that can make you trip. What can I do in the bedroom?  Use night lights.  Make sure that you have a light by your bed that is easy to reach.  Do not use any sheets or blankets that are too big for your bed. They should not hang down onto the floor.  Have a firm chair that has side arms. You can use this for support while you get dressed.  Do not have throw rugs and other things on the floor that can make you trip. What can I do in the kitchen?  Clean up any spills right away.  Avoid walking on wet floors.  Keep items that you use a lot in easy-to-reach places.  If you need to reach something above you, use a strong step stool that has a grab bar.  Keep electrical cords out of the way.  Do not use floor polish or wax that makes floors slippery. If you must use wax, use non-skid floor wax.  Do not have throw rugs and other things on the floor that can make you trip. What can I do with my stairs?  Do not leave any items on the stairs.  Make sure that there are handrails on both sides of the stairs and use them. Fix handrails that are broken or loose. Make sure that handrails are as long as the stairways.  Check any carpeting to make sure that it is firmly attached to the stairs. Fix any carpet that is loose or worn.  Avoid having throw rugs at the top or bottom of the stairs. If you do have throw rugs, attach them to the floor with carpet tape.  Make sure that you have a light switch at the top of the stairs and the bottom of the stairs. If you do not have them, ask someone to add them for you. What else can I do to help prevent falls?  Wear shoes  that:  Do not have high heels.  Have rubber bottoms.  Are comfortable and fit you well.  Are closed at the toe. Do not wear sandals.  If you use a stepladder:  Make sure that it is fully opened. Do not climb a closed stepladder.  Make sure that both sides of the stepladder are locked into place.  Ask someone to hold it for you, if possible.  Clearly mark and make sure that you can see:  Any grab bars or handrails.  First and last steps.  Where the edge of each step is.  Use tools that help you move around (mobility aids) if they are needed. These include:  Canes.  Walkers.  Scooters.  Crutches.  Turn on the lights when you go into a dark area. Replace any light bulbs as soon as they burn out.  Set up your furniture so you have a clear path. Avoid moving your furniture around.  If any of your floors are uneven, fix them.  If there are any pets around you, be aware of where they are.  Review your medicines with your doctor. Some medicines can make you feel dizzy. This can increase your chance of falling. Ask your doctor what other things that you can do to help prevent falls. This information is not intended to replace advice given to you by your health care provider. Make sure you discuss any questions you have with your health care provider. Document Released: 11/02/2008 Document Revised: 06/14/2015 Document Reviewed: 02/10/2014 Elsevier Interactive Patient Education  2017 [employer].

## 2019-09-12 ENCOUNTER — Other Ambulatory Visit: Payer: Self-pay | Admitting: Family Medicine

## 2019-09-13 ENCOUNTER — Ambulatory Visit: Payer: Medicare Other | Admitting: Podiatry

## 2019-09-14 ENCOUNTER — Encounter: Payer: Self-pay | Admitting: Family Medicine

## 2019-09-14 ENCOUNTER — Other Ambulatory Visit: Payer: Self-pay

## 2019-09-14 ENCOUNTER — Ambulatory Visit (INDEPENDENT_AMBULATORY_CARE_PROVIDER_SITE_OTHER): Payer: Medicare Other | Admitting: Family Medicine

## 2019-09-14 VITALS — BP 118/78 | HR 90 | Temp 98.3°F | Wt 166.0 lb

## 2019-09-14 DIAGNOSIS — E1169 Type 2 diabetes mellitus with other specified complication: Secondary | ICD-10-CM | POA: Diagnosis not present

## 2019-09-14 DIAGNOSIS — F71 Moderate intellectual disabilities: Secondary | ICD-10-CM

## 2019-09-14 DIAGNOSIS — I10 Essential (primary) hypertension: Secondary | ICD-10-CM | POA: Diagnosis not present

## 2019-09-14 DIAGNOSIS — Z23 Encounter for immunization: Secondary | ICD-10-CM

## 2019-09-14 DIAGNOSIS — L600 Ingrowing nail: Secondary | ICD-10-CM | POA: Diagnosis not present

## 2019-09-14 DIAGNOSIS — E785 Hyperlipidemia, unspecified: Secondary | ICD-10-CM | POA: Diagnosis not present

## 2019-09-14 LAB — POCT GLYCOSYLATED HEMOGLOBIN (HGB A1C): Hemoglobin A1C: 5.7 % — AB (ref 4.0–5.6)

## 2019-09-14 MED ORDER — DILTIAZEM HCL ER COATED BEADS 240 MG PO CP24: ORAL_CAPSULE | ORAL | 3 refills | Status: DC

## 2019-09-14 MED ORDER — METFORMIN HCL 1000 MG PO TABS: 1000.0000 mg | ORAL_TABLET | Freq: Two times a day (BID) | ORAL | 3 refills | Status: DC

## 2019-09-14 MED ORDER — IPRATROPIUM BROMIDE 0.06 % NA SOLN
1.0000 | Freq: Four times a day (QID) | NASAL | 5 refills | Status: DC
Start: 2019-09-14 — End: 2020-02-08

## 2019-09-14 MED ORDER — PRAVASTATIN SODIUM 40 MG PO TABS: ORAL_TABLET | ORAL | 3 refills | Status: DC

## 2019-09-14 MED ORDER — HYDROCHLOROTHIAZIDE 12.5 MG PO CAPS: ORAL_CAPSULE | ORAL | 3 refills | Status: DC

## 2019-09-14 NOTE — Addendum Note (Signed)
Addended by: Marrion Coy on: 09/14/2019 02:15 PM   Modules accepted: Orders

## 2019-09-14 NOTE — Addendum Note (Signed)
Addended by: Marrion Coy on: 09/14/2019 02:14 PM   Modules accepted: Orders

## 2019-09-14 NOTE — Patient Instructions (Signed)
Ingrown Toenail An ingrown toenail occurs when the corner or sides of a toenail grow into the surrounding skin. This causes discomfort and pain. The big toe is most commonly affected, but any of the toes can be affected. If an ingrown toenail is not treated, it can become infected. What are the causes? This condition may be caused by:  Wearing shoes that are too small or tight.  An injury, such as stubbing your toe or having your toe stepped on.  Improper cutting or care of your toenails.  Having nail or foot abnormalities that were present from birth (congenital abnormalities), such as having a nail that is too big for your toe. What increases the risk? The following factors may make you more likely to develop ingrown toenails:  Age. Nails tend to get thicker with age, so ingrown nails are more common among older people.  Cutting your toenails incorrectly, such as cutting them very short or cutting them unevenly. An ingrown toenail is more likely to get infected if you have:  Diabetes.  Blood flow (circulation) problems. What are the signs or symptoms? Symptoms of an ingrown toenail may include:  Pain, soreness, or tenderness.  Redness.  Swelling.  Hardening of the skin that surrounds the toenail. Signs that an ingrown toenail may be infected include:  Fluid or pus.  Symptoms that get worse instead of better. How is this diagnosed? An ingrown toenail may be diagnosed based on your medical history, your symptoms, and a physical exam. If you have fluid or blood coming from your toenail, a sample may be collected to test for the specific type of bacteria that is causing the infection. How is this treated? Treatment depends on how severe your ingrown toenail is. You may be able to care for your toenail at home.  If you have an infection, you may be prescribed antibiotic medicines.  If you have fluid or pus draining from your toenail, your health care provider may drain  it.  If you have trouble walking, you may be given crutches to use.  If you have a severe or infected ingrown toenail, you may need a procedure to remove part or all of the nail. Follow these instructions at home: Foot care   Do not pick at your toenail or try to remove it yourself.  Soak your foot in warm, soapy water. Do this for 20 minutes, 3 times a day, or as often as told by your health care provider. This helps to keep your toe clean and keep your skin soft.  Wear shoes that fit well and are not too tight. Your health care provider may recommend that you wear open-toed shoes while you heal.  Trim your toenails regularly and carefully. Cut your toenails straight across to prevent injury to the skin at the corners of the toenail. Do not cut your nails in a curved shape.  Keep your feet clean and dry to help prevent infection. Medicines  Take over-the-counter and prescription medicines only as told by your health care provider.  If you were prescribed an antibiotic, take it as told by your health care provider. Do not stop taking the antibiotic even if you start to feel better. Activity  Return to your normal activities as told by your health care provider. Ask your health care provider what activities are safe for you.  Avoid activities that cause pain. General instructions  If your health care provider told you to use crutches to help you move around, use them   as instructed.  Keep all follow-up visits as told by your health care provider. This is important. Contact a health care provider if:  You have more redness, swelling, pain, or other symptoms that do not improve with treatment.  You have fluid, blood, or pus coming from your toenail. Get help right away if:  You have a red streak on your skin that starts at your foot and spreads up your leg.  You have a fever. Summary  An ingrown toenail occurs when the corner or sides of a toenail grow into the surrounding  skin. This causes discomfort and pain. The big toe is most commonly affected, but any of the toes can be affected.  If an ingrown toenail is not treated, it can become infected.  Fluid or pus draining from your toenail is a sign of infection. Your health care provider may need to drain it. You may be given antibiotics to treat the infection.  Trimming your toenails regularly and properly can help you prevent an ingrown toenail. This information is not intended to replace advice given to you by your health care provider. Make sure you discuss any questions you have with your health care provider. Document Revised: 04/30/2018 Document Reviewed: 09/24/2016 Elsevier Patient Education  [place].  Diabetes Mellitus and [place] care is an important part of your health, especially when you have diabetes. Diabetes may cause you to have problems because of poor blood flow (circulation) to your feet and legs, which can cause your skin to:  Become thinner and drier.  Break more easily.  Heal more slowly.  Peel and crack. You may also have nerve damage (neuropathy) in your legs and feet, causing decreased feeling in them. This means that you may not notice minor injuries to your feet that could lead to more serious problems. Noticing and addressing any potential problems early is the best way to prevent future foot problems. How to care for your feet Foot hygiene  Wash your feet daily with warm water and mild soap. Do not use hot water. Then, pat your feet and the areas between your toes until they are completely dry. Do not soak your feet as this can dry your skin.  Trim your toenails straight across. Do not dig under them or around the cuticle. File the edges of your nails with an emery board or nail file.  Apply a moisturizing lotion or petroleum jelly to the skin on your feet and to dry, brittle toenails. Use lotion that does not contain alcohol and is unscented. Do not apply  lotion between your toes. Shoes and socks  Wear clean socks or stockings every day. Make sure they are not too tight. Do not wear knee-high stockings since they may decrease blood flow to your legs.  Wear shoes that fit properly and have enough cushioning. Always look in your shoes before you put them on to be sure there are no objects inside.  To break in new shoes, wear them for just a few hours a day. This prevents injuries on your feet. Wounds, scrapes, corns, and calluses  Check your feet daily for blisters, cuts, bruises, sores, and redness. If you cannot see the bottom of your feet, use a mirror or ask someone for help.  Do not cut corns or calluses or try to remove them with medicine.  If you find a minor scrape, cut, or break in the skin on your feet, keep it and the skin around it clean and  dry. You may clean these areas with mild soap and water. Do not clean the area with peroxide, alcohol, or iodine.  If you have a wound, scrape, corn, or callus on your foot, look at it several times a day to make sure it is healing and not infected. Check for: ? Redness, swelling, or pain. ? Fluid or blood. ? Warmth. ? Pus or a bad smell. General instructions  Do not cross your legs. This may decrease blood flow to your feet.  Do not use heating pads or hot water bottles on your feet. They may burn your skin. If you have lost feeling in your feet or legs, you may not know this is happening until it is too late.  Protect your feet from hot and cold by wearing shoes, such as at the beach or on hot pavement.  Schedule a complete foot exam at least once a year (annually) or more often if you have foot problems. If you have foot problems, report any cuts, sores, or bruises to your health care provider immediately. Contact a health care provider if:  You have a medical condition that increases your risk of infection and you have any cuts, sores, or bruises on your feet.  You have an injury  that is not healing.  You have redness on your legs or feet.  You feel burning or tingling in your legs or feet.  You have pain or cramps in your legs and feet.  Your legs or feet are numb.  Your feet always feel cold.  You have pain around a toenail. Get help right away if:  You have a wound, scrape, corn, or callus on your foot and: ? You have pain, swelling, or redness that gets worse. ? You have fluid or blood coming from the wound, scrape, corn, or callus. ? Your wound, scrape, corn, or callus feels warm to the touch. ? You have pus or a bad smell coming from the wound, scrape, corn, or callus. ? You have a fever. ? You have a red line going up your leg. Summary  Check your feet every day for cuts, sores, red spots, swelling, and blisters.  Moisturize feet and legs daily.  Wear shoes that fit properly and have enough cushioning.  If you have foot problems, report any cuts, sores, or bruises to your health care provider immediately.  Schedule a complete foot exam at least once a year (annually) or more often if you have foot problems. This information is not intended to replace advice given to you by your health care provider. Make sure you discuss any questions you have with your health care provider. Document Revised: 09/29/2018 Document Reviewed: 02/08/2016 Elsevier Patient Education  [place].

## 2019-09-14 NOTE — Progress Notes (Signed)
Subjective:    Patient ID: Linda Thompson, female    DOB: 1955-06-24, 64 y.o.   MRN: [id number]  No chief complaint on file.   HPI Patient was seen today for acute concern.  Patient endorses pain of right great toe.  Denies erythema or drainage.  Wearing diabetic shoes.  Seen by podiatry, Linda Thompson, but had to cancel recent appointment.  Endorses h/o ingrown toenail on left toe s/p removal.  Ask if okay to use Epson salt.    Patient taking Metamucil for GI issues.  Seen in [facility] by gastroenterology.  States was prescribed vitamin B12 dissolving tabs, however endorses issues with getting medication from pharmacy.  States was told med is on backorder versus her provider retired. Linda Thompson prescriber on rx.  Requesting refills on diltiazem ER 240 mg, HCTZ 12.5 mg, pravastatin 40 mg, Metformin 1000 mg, ipratropium 0.06% spray.  Pt requesting PCV-13 vaccine.  Patient endorses upcoming appointments 9/8 has eye appointment for an injection.  Has a dental appointment on 9/22.  Patient request any appointments made be in the am and not on Mondays or Fridays.    Linda Thompson and [employer] in [place], [place] help pt handle her affairs.  Paralegal-Linda Thompson and [employer].  [phone] Past Medical History:  Diagnosis Date  . Allergic rhinitis   . Allergy   . Anemia   . Chronic low back pain   . DDD (degenerative disc disease), lumbar   . GERD (gastroesophageal reflux disease)    w/ hx diaphragmatic hernia  . Glaucoma   . GLAUCOMA NOS 03/11/2006   Qualifier: Diagnosis of  By: Diona Browner MD, Amy    . Heart murmur   . Hx of fracture    R arm, foot  . Hyperlipemia   . Hypertension   . IBS (irritable bowel syndrome)   . Low back pain 11/22/2012  . Meningioma 12/20/2001   Repeat MR from 2017 - radiology feels not a meningioma, but rather "Thickening of the anterior falx appears to be related to degenerative ossification and not meningioma." HCPOA opted not to do repeat  imaging with CT for the possible hemangioma and declined further evaluation of this.  . Meningioma ([place])    ant falx, stable on prior imaging  . Moderate intellectual disabilities   . Murmur   . Obesity   . Osteoarthrosis, unspecified whether generalized or localized, ankle and foot   . Sarcoidosis    difuse bony lesions and LDA, dx by biopsy in 2016  . Transaminitis   . Type II or unspecified type diabetes mellitus without mention of complication, not stated as uncontrolled     Allergies  Allergen Reactions  . Ace Inhibitors Cough  . Amoxicillin-Pot Clavulanate Diarrhea  . Augmentin [Amoxicillin-Pot Clavulanate] Other (See Comments)    diarrhea  . Azithromycin Diarrhea  . Ceftin [Cefuroxime Axetil]     diarrhea  . Cefuroxime Diarrhea and Other (See Comments)    Generalized weakness  . Cyclobenzaprine Hcl Diarrhea  . Flexeril [Cyclobenzaprine] Diarrhea  . Flonase [Fluticasone Propionate] Other (See Comments)    Nose bleed  . Ibuprofen Nausea And Vomiting    ROS General: Denies fever, chills, night sweats, changes in weight, changes in appetite HEENT: Denies headaches, ear pain, changes in vision, rhinorrhea, sore throat CV: Denies CP, palpitations, SOB, orthopnea Pulm: Denies SOB, cough, wheezing GI: Denies abdominal pain, nausea, vomiting, diarrhea, constipation GU: Denies dysuria, hematuria, frequency, vaginal discharge Msk: Denies muscle cramps, joint pains  +R great toe pain  Neuro: Denies weakness, numbness, tingling Skin: Denies rashes, bruising Psych: Denies depression, anxiety, hallucinations    Objective:    Blood pressure 118/78, pulse 90, temperature 98.3 F (36.8 C), temperature source Oral, weight 166 lb (75.3 kg), last menstrual period 01/21/1992, SpO2 97 %.  Gen. Pleasant, well-nourished, mod ID, repeats self, in no distress, normal affect   HEENT: [place]/AT, face symmetric, conjunctiva clear, no scleral icterus, PERRLA, EOMI, nares patent without  drainage Lungs: no accessory muscle use, CTAB, no wheezes or rales Cardiovascular: RRR, no peripheral edema Neuro:  A&Ox3, CN II-XII intact, normal gait Skin:  Warm, no lesions/ rash.  Hallux toenail of right great toe. Diabetic Foot Exam - Simple   Simple Foot Form Diabetic Foot exam was performed with the following findings: Yes 09/14/2019 11:44 AM  Visual Inspection See comments: Yes Sensation Testing Intact to touch and monofilament testing bilaterally: Yes See comments: Yes Pulse Check Posterior Tibialis and Dorsalis pulse intact bilaterally: Yes Comments No deformities, high arches, mild callus on heel of R foot.  Slightly diminished vibratory sensation on L great toe.     Wt Readings from Last 3 Encounters:  09/14/19 166 lb (75.3 kg)  08/18/19 167 lb (75.8 kg)  11/10/18 180 lb 9.6 oz (81.9 kg)    Lab Results  Component Value Date   WBC 9.6 11/10/2018   HGB 15.8 (H) 11/10/2018   HCT 48.8 (H) 11/10/2018   PLT 348.0 11/10/2018   GLUCOSE 103 (H) 11/10/2018   CHOL 180 11/10/2018   TRIG 190.0 (H) 11/10/2018   HDL 37.80 (L) 11/10/2018   LDLDIRECT 95.0 05/19/2016   LDLCALC 104 (H) 11/10/2018   ALT 26 11/10/2018   AST 20 11/10/2018   NA 144 11/10/2018   K 4.0 11/10/2018   CL 104 11/10/2018   CREATININE 0.58 11/10/2018   BUN 11 11/10/2018   CO2 30 11/10/2018   TSH 2.09 11/10/2018   INR 0.99 10/03/2014   HGBA1C 6.3 11/10/2018   MICROALBUR 4.8 (H) 11/26/2017    Assessment/Plan:  Ingrown nail of great toe of right foot -Without infection -Okay to soak in warm water and Epsom salt -Appointment made with [facility] for September 1 at 10 AM with Linda Thompson, podiatrist  Hyperlipidemia associated with type 2 diabetes mellitus ([place])  -Stable -History of diabetic retinopathy -Continue Metformin 1000 mg twice daily -We will obtain hemoglobin A1c this visit.  Hemoglobin A1c 5.7%.  Consider decreasing Metformin, however hesitant to make medication adjustments as  do not want to cause confusion per patient. -Foot exam done this visit -Patient up-to-date on eye exams.  Has upcoming appointment on 9/8 with ophthalmology for eye injections. -We will obtain urine microalbumin/creatinine this visit -We will plan Rx for diabetic shoes - Plan: Microalbumin/Creatinine Ratio, Urine, For Home Use Only DME Diabetic Shoe, metFORMIN (GLUCOPHAGE) 1000 MG tablet, pravastatin (PRAVACHOL) 40 MG tablet, POC HgB A1c  Essential hypertension  -Controlled -Continue current medications - Plan: diltiazem (CARDIZEM CD) 240 MG 24 hr capsule, hydrochlorothiazide (MICROZIDE) 12.5 MG capsule  Moderate ID -make attempts to decrease med list/cut down on possible confusion -Linda Thompson and [employer] in [place], [place] help pt handle her affairs.  Paralegal-Linda Thompson and [employer].  [phone]  Need for Pneumococcal vaccine -PSV 13 given this visit  F/u prn  Grier Mitts, MD

## 2019-09-15 ENCOUNTER — Other Ambulatory Visit: Payer: Self-pay

## 2019-09-15 ENCOUNTER — Telehealth: Payer: Self-pay | Admitting: Family Medicine

## 2019-09-15 LAB — MICROALBUMIN / CREATININE URINE RATIO
Creatinine, Urine: 155 mg/dL (ref 20–275)
Microalb Creat Ratio: 15 mcg/mg creat (ref <30)
Microalb, Ur: 2.4 mg/dL

## 2019-09-15 MED ORDER — CYANOCOBALAMIN 500 MCG PO TABS: 500.0000 ug | ORAL_TABLET | Freq: Every day | ORAL | 1 refills | Status: DC

## 2019-09-15 NOTE — Telephone Encounter (Signed)
Pt Rx sent to requested pharmacy per Dr Volanda Napoleon approval

## 2019-09-15 NOTE — Telephone Encounter (Signed)
Pt is wanting her medication   vitamin B-12 (CYANOCOBALAMIN) 500 MCG tablet   Refilled. She is out of it and it helps her  [id number] [place], [place]. AT [place]  [address], [place] [address]  Phone:  [phone] Fax:  [phone]   Please advise

## 2019-09-21 ENCOUNTER — Other Ambulatory Visit: Payer: Self-pay

## 2019-09-21 ENCOUNTER — Encounter: Payer: Self-pay | Admitting: Podiatry

## 2019-09-21 ENCOUNTER — Ambulatory Visit (INDEPENDENT_AMBULATORY_CARE_PROVIDER_SITE_OTHER): Payer: Medicare Other | Admitting: Podiatry

## 2019-09-21 DIAGNOSIS — M79675 Pain in left toe(s): Secondary | ICD-10-CM | POA: Diagnosis not present

## 2019-09-21 DIAGNOSIS — E119 Type 2 diabetes mellitus without complications: Secondary | ICD-10-CM

## 2019-09-21 DIAGNOSIS — B351 Tinea unguium: Secondary | ICD-10-CM | POA: Diagnosis not present

## 2019-09-21 DIAGNOSIS — M2041 Other hammer toe(s) (acquired), right foot: Secondary | ICD-10-CM | POA: Diagnosis not present

## 2019-09-21 DIAGNOSIS — M2042 Other hammer toe(s) (acquired), left foot: Secondary | ICD-10-CM

## 2019-09-21 DIAGNOSIS — M79674 Pain in right toe(s): Secondary | ICD-10-CM | POA: Diagnosis not present

## 2019-09-21 NOTE — Progress Notes (Signed)
  Subjective:  Patient ID: Linda Thompson, female    DOB: 1955-11-03,  MRN: [id number]  Chief Complaint  Patient presents with  . Ingrown Toenail    RT- great toenail- pt states she is having a soreness when walking    64 y.o. female returns for the above complaint.  Patient presents with thickened elongated dystrophic toenails x10.  Patient states is painful to touch.  She would like to have them debrided down.  She also has secondary complaint of hammertoe contractures and would like to get new diabetic shoes.  She states her diabetic shoes are greater than 44 years old.  She recently went to her primary care physician as well.  She is a controlled diabetic.  She denies any other acute complaints  Objective:  There were no vitals filed for this visit. Podiatric Exam: Vascular: dorsalis pedis and posterior tibial pulses are palpable bilateral. Capillary return is immediate. Temperature gradient is WNL. Skin turgor WNL  Sensorium: Normal Semmes Weinstein monofilament test. Normal tactile sensation bilaterally. Nail Exam: Pt has thick disfigured discolored nails with subungual debris noted bilateral entire nail hallux through fifth toenails.  Pain on palpation to the nails. Ulcer Exam: There is no evidence of ulcer or pre-ulcerative changes or infection. Orthopedic Exam: Muscle tone and strength are WNL. No limitations in general ROM. No crepitus or effusions noted. HAV  B/L.  Hammer toes 2-5  B/L. Skin: No Porokeratosis. No infection or ulcers    Assessment & Plan:   1. Pain due to onychomycosis of toenails of both feet   2. Diabetes mellitus without complication ([place])   3. Hammertoe, bilateral     Patient was evaluated and treated and all questions answered.  Hammertoe contractures two through five bilaterally -I explained to patient the etiology of hammertoe contractures and various treatment options were discussed.  I explained to the patient that given that she is a diabetic she is  a high risk of developing ulcerations if not properly offloaded. -She will be scheduled to see rick for diabetic shoes.  Onychomycosis with pain  -Nails palliatively debrided as below. -Educated on self-care  Procedure: Nail Debridement Rationale: pain  Type of Debridement: manual, sharp debridement. Instrumentation: Nail nipper, rotary burr. Number of Nails: 10  Procedures and Treatment: Consent by patient was obtained for treatment procedures. The patient understood the discussion of treatment and procedures well. All questions were answered thoroughly reviewed. Debridement of mycotic and hypertrophic toenails, 1 through 5 bilateral and clearing of subungual debris. No ulceration, no infection noted.  Return Visit-Office Procedure: Patient instructed to return to the office for a follow up visit 3 months for continued evaluation and treatment.  Boneta Lucks, DPM    Return in about 3 months (around 12/21/2019).

## 2019-09-28 ENCOUNTER — Encounter (INDEPENDENT_AMBULATORY_CARE_PROVIDER_SITE_OTHER): Payer: Medicare Other | Admitting: Ophthalmology

## 2019-09-28 ENCOUNTER — Other Ambulatory Visit: Payer: Self-pay

## 2019-09-28 DIAGNOSIS — I10 Essential (primary) hypertension: Secondary | ICD-10-CM

## 2019-09-28 DIAGNOSIS — H43813 Vitreous degeneration, bilateral: Secondary | ICD-10-CM | POA: Diagnosis not present

## 2019-09-28 DIAGNOSIS — H33302 Unspecified retinal break, left eye: Secondary | ICD-10-CM

## 2019-09-28 DIAGNOSIS — H35033 Hypertensive retinopathy, bilateral: Secondary | ICD-10-CM

## 2019-09-28 DIAGNOSIS — H34832 Tributary (branch) retinal vein occlusion, left eye, with macular edema: Secondary | ICD-10-CM

## 2019-10-05 ENCOUNTER — Other Ambulatory Visit: Payer: Self-pay | Admitting: Family Medicine

## 2019-10-18 ENCOUNTER — Ambulatory Visit: Payer: Medicare Other | Admitting: Orthotics

## 2019-10-18 ENCOUNTER — Other Ambulatory Visit: Payer: Self-pay

## 2019-10-18 DIAGNOSIS — M19072 Primary osteoarthritis, left ankle and foot: Secondary | ICD-10-CM

## 2019-10-18 DIAGNOSIS — M2041 Other hammer toe(s) (acquired), right foot: Secondary | ICD-10-CM

## 2019-10-18 DIAGNOSIS — E119 Type 2 diabetes mellitus without complications: Secondary | ICD-10-CM

## 2019-10-18 DIAGNOSIS — M79675 Pain in left toe(s): Secondary | ICD-10-CM

## 2019-10-18 NOTE — Progress Notes (Signed)

## 2019-10-26 DIAGNOSIS — Z23 Encounter for immunization: Secondary | ICD-10-CM | POA: Diagnosis not present

## 2019-11-01 ENCOUNTER — Other Ambulatory Visit: Payer: Self-pay | Admitting: Family Medicine

## 2019-11-02 ENCOUNTER — Other Ambulatory Visit: Payer: Self-pay

## 2019-11-02 ENCOUNTER — Encounter (INDEPENDENT_AMBULATORY_CARE_PROVIDER_SITE_OTHER): Payer: Medicare Other | Admitting: Ophthalmology

## 2019-11-02 DIAGNOSIS — H35033 Hypertensive retinopathy, bilateral: Secondary | ICD-10-CM | POA: Diagnosis not present

## 2019-11-02 DIAGNOSIS — I10 Essential (primary) hypertension: Secondary | ICD-10-CM | POA: Diagnosis not present

## 2019-11-02 DIAGNOSIS — H34832 Tributary (branch) retinal vein occlusion, left eye, with macular edema: Secondary | ICD-10-CM

## 2019-11-02 DIAGNOSIS — H43813 Vitreous degeneration, bilateral: Secondary | ICD-10-CM

## 2019-11-04 ENCOUNTER — Other Ambulatory Visit: Payer: Self-pay | Admitting: Family Medicine

## 2019-11-04 ENCOUNTER — Other Ambulatory Visit: Payer: Self-pay

## 2019-11-04 MED ORDER — FLUTICASONE PROPIONATE 50 MCG/ACT NA SUSP: 2.0000 | Freq: Every day | NASAL | 1 refills | Status: DC

## 2019-11-27 DIAGNOSIS — Z23 Encounter for immunization: Secondary | ICD-10-CM | POA: Diagnosis not present

## 2019-11-28 ENCOUNTER — Telehealth: Payer: Self-pay | Admitting: Podiatry

## 2019-11-28 NOTE — Telephone Encounter (Signed)
Patient called and left a message on the nurse line wanting to check on the status of her diabetic shoes. Can someone please follow up on this and return her call? Thanks.

## 2019-11-29 ENCOUNTER — Encounter: Payer: Self-pay | Admitting: Obstetrics & Gynecology

## 2019-12-07 ENCOUNTER — Encounter (INDEPENDENT_AMBULATORY_CARE_PROVIDER_SITE_OTHER): Payer: Medicare Other | Admitting: Ophthalmology

## 2019-12-07 ENCOUNTER — Other Ambulatory Visit: Payer: Self-pay

## 2019-12-07 DIAGNOSIS — H33302 Unspecified retinal break, left eye: Secondary | ICD-10-CM | POA: Diagnosis not present

## 2019-12-07 DIAGNOSIS — H34832 Tributary (branch) retinal vein occlusion, left eye, with macular edema: Secondary | ICD-10-CM | POA: Diagnosis not present

## 2019-12-07 DIAGNOSIS — H35033 Hypertensive retinopathy, bilateral: Secondary | ICD-10-CM | POA: Diagnosis not present

## 2019-12-07 DIAGNOSIS — H43813 Vitreous degeneration, bilateral: Secondary | ICD-10-CM | POA: Diagnosis not present

## 2019-12-07 DIAGNOSIS — I10 Essential (primary) hypertension: Secondary | ICD-10-CM | POA: Diagnosis not present

## 2019-12-14 ENCOUNTER — Other Ambulatory Visit: Payer: Self-pay

## 2019-12-14 ENCOUNTER — Telehealth: Payer: Self-pay | Admitting: Family Medicine

## 2019-12-14 MED ORDER — FLUTICASONE PROPIONATE 50 MCG/ACT NA SUSP: 2.0000 | Freq: Every day | NASAL | 1 refills | Status: DC

## 2019-12-14 NOTE — Telephone Encounter (Signed)
Pt is calling in needing a refill on Rx fluticasone (FLONASE) 50 MCG nasal spray   [facility]  [address] [facility] [address]

## 2019-12-14 NOTE — Telephone Encounter (Signed)
Rx sent 

## 2019-12-25 ENCOUNTER — Other Ambulatory Visit: Payer: Self-pay | Admitting: Family Medicine

## 2019-12-25 DIAGNOSIS — I10 Essential (primary) hypertension: Secondary | ICD-10-CM

## 2019-12-25 DIAGNOSIS — E785 Hyperlipidemia, unspecified: Secondary | ICD-10-CM

## 2019-12-25 DIAGNOSIS — E1169 Type 2 diabetes mellitus with other specified complication: Secondary | ICD-10-CM

## 2019-12-27 ENCOUNTER — Other Ambulatory Visit: Payer: Self-pay | Admitting: Family Medicine

## 2019-12-27 NOTE — Telephone Encounter (Signed)
Pt is calling in stating that the pharmacy has called her on Sunday to get her to change her medication so that she can get the medication free.  Pt is not sure what kind of medication that the they want her to change Rx pravastatin (PRAVACHOL) 40 MG  [facility] [address]  Pt is aware that the office will call the pharmacy and see what the pt is talking about.  Pt would like to have a call back.

## 2019-12-27 NOTE — Telephone Encounter (Signed)
Ok to change Rx per pt pharmacy

## 2019-12-28 ENCOUNTER — Ambulatory Visit (INDEPENDENT_AMBULATORY_CARE_PROVIDER_SITE_OTHER): Payer: Medicare Other | Admitting: Podiatry

## 2019-12-28 ENCOUNTER — Encounter: Payer: Self-pay | Admitting: Podiatry

## 2019-12-28 ENCOUNTER — Other Ambulatory Visit: Payer: Self-pay

## 2019-12-28 DIAGNOSIS — M2042 Other hammer toe(s) (acquired), left foot: Secondary | ICD-10-CM | POA: Diagnosis not present

## 2019-12-28 DIAGNOSIS — M2041 Other hammer toe(s) (acquired), right foot: Secondary | ICD-10-CM | POA: Diagnosis not present

## 2019-12-28 DIAGNOSIS — E119 Type 2 diabetes mellitus without complications: Secondary | ICD-10-CM

## 2019-12-28 NOTE — Progress Notes (Signed)
This patient presents to the office to pick up her diabetic shoes which she was prescribed.  She is also concerned about her nails and whether they need to be treated today.  She was casted and measured for extra-depth diabetic shoes on 928 of this year.  She also has previously been treated for nail care by Dr. Posey Pronto.  She presents the office today to pick up her diabetic shoes as well as having nail care.  Vascular  Dorsalis pedis and posterior tibial pulses are palpable  B/L.  Capillary return  WNL.  Temperature gradient is  WNL.  Skin turgor  WNL  Sensorium  Senn Weinstein monofilament wire  WNL. Normal tactile sensation.  Nail Exam  Thick disfigured discolored nails 1-5  B/L.  Orthopedic  Exam  Muscle tone and muscle strength  WNL.  No limitations of motion feet  B/L.  No crepitus or joint effusion noted.  HAV  B/L.  Hammer toes  B/L.  Skin  No open lesions.  Normal skin texture and turgor.  Diabetes mellitus  Type 2.  ROV.  Examination of her toenails revealed no need to perform any nail treatment today.  I checked into her shoes and they have not arrived to this office at this time.  Patient was told that she will be called to pick up her shoes when the diabetic shoes arrive.   Gardiner Barefoot DPM

## 2019-12-28 NOTE — Telephone Encounter (Signed)
Pt is calling in stating that she got a call back from her pharmacy about a medication that starts with a "D" but did not know the name of the medication and if it is something that she needs refilled she would like for [place] to go ahead and send it in for her to the pharmacy [facility].

## 2019-12-28 NOTE — Telephone Encounter (Signed)
Spoke with pt pharmacy state that pt has enough refills and that pt needs to pick up refill that is ready, pt is aware

## 2019-12-28 NOTE — Telephone Encounter (Signed)
Pt is calling in to check the status of her cholesterol medication that she can get it free.

## 2019-12-29 NOTE — Telephone Encounter (Signed)
Pt is calling to check the status of the refill of the medication and would like to have a call back after 2:00 p.m. to let her know what she should do about the changing of her medication so that she can get it free and get someone to pick it up b/c she does not drive.

## 2020-01-05 ENCOUNTER — Ambulatory Visit: Payer: Medicare Other | Admitting: Orthotics

## 2020-01-05 ENCOUNTER — Other Ambulatory Visit: Payer: Self-pay

## 2020-01-05 DIAGNOSIS — M2041 Other hammer toe(s) (acquired), right foot: Secondary | ICD-10-CM

## 2020-01-05 DIAGNOSIS — E119 Type 2 diabetes mellitus without complications: Secondary | ICD-10-CM

## 2020-01-05 NOTE — Progress Notes (Signed)
Reordering shoe SureFit 7w; she really is 6.5 but surefil is no longer gioing get HP stock.  I feel 7 will fit her fine   [phone]

## 2020-01-11 ENCOUNTER — Other Ambulatory Visit: Payer: Self-pay

## 2020-01-11 ENCOUNTER — Encounter (INDEPENDENT_AMBULATORY_CARE_PROVIDER_SITE_OTHER): Payer: Medicare Other | Admitting: Ophthalmology

## 2020-01-11 DIAGNOSIS — H43813 Vitreous degeneration, bilateral: Secondary | ICD-10-CM | POA: Diagnosis not present

## 2020-01-11 DIAGNOSIS — H34832 Tributary (branch) retinal vein occlusion, left eye, with macular edema: Secondary | ICD-10-CM

## 2020-01-11 DIAGNOSIS — I10 Essential (primary) hypertension: Secondary | ICD-10-CM | POA: Diagnosis not present

## 2020-01-11 DIAGNOSIS — H35033 Hypertensive retinopathy, bilateral: Secondary | ICD-10-CM | POA: Diagnosis not present

## 2020-01-11 DIAGNOSIS — H33302 Unspecified retinal break, left eye: Secondary | ICD-10-CM | POA: Diagnosis not present

## 2020-01-17 ENCOUNTER — Other Ambulatory Visit: Payer: Self-pay

## 2020-01-17 ENCOUNTER — Ambulatory Visit (INDEPENDENT_AMBULATORY_CARE_PROVIDER_SITE_OTHER): Payer: Medicare Other | Admitting: Orthotics

## 2020-01-17 DIAGNOSIS — E119 Type 2 diabetes mellitus without complications: Secondary | ICD-10-CM

## 2020-01-17 DIAGNOSIS — M2041 Other hammer toe(s) (acquired), right foot: Secondary | ICD-10-CM

## 2020-01-17 DIAGNOSIS — M2042 Other hammer toe(s) (acquired), left foot: Secondary | ICD-10-CM

## 2020-01-17 DIAGNOSIS — M19072 Primary osteoarthritis, left ankle and foot: Secondary | ICD-10-CM

## 2020-01-17 NOTE — Progress Notes (Signed)

## 2020-01-30 DIAGNOSIS — L304 Erythema intertrigo: Secondary | ICD-10-CM | POA: Diagnosis not present

## 2020-02-08 ENCOUNTER — Other Ambulatory Visit: Payer: Self-pay | Admitting: Family Medicine

## 2020-02-15 ENCOUNTER — Encounter (INDEPENDENT_AMBULATORY_CARE_PROVIDER_SITE_OTHER): Payer: Medicare Other | Admitting: Ophthalmology

## 2020-02-15 ENCOUNTER — Other Ambulatory Visit: Payer: Self-pay | Admitting: Family Medicine

## 2020-02-15 DIAGNOSIS — E1169 Type 2 diabetes mellitus with other specified complication: Secondary | ICD-10-CM

## 2020-02-20 ENCOUNTER — Encounter (INDEPENDENT_AMBULATORY_CARE_PROVIDER_SITE_OTHER): Payer: Medicare Other | Admitting: Ophthalmology

## 2020-02-20 ENCOUNTER — Other Ambulatory Visit: Payer: Self-pay

## 2020-02-20 DIAGNOSIS — H34832 Tributary (branch) retinal vein occlusion, left eye, with macular edema: Secondary | ICD-10-CM | POA: Diagnosis not present

## 2020-02-20 DIAGNOSIS — H33302 Unspecified retinal break, left eye: Secondary | ICD-10-CM | POA: Diagnosis not present

## 2020-02-20 DIAGNOSIS — H43813 Vitreous degeneration, bilateral: Secondary | ICD-10-CM | POA: Diagnosis not present

## 2020-02-20 DIAGNOSIS — H35033 Hypertensive retinopathy, bilateral: Secondary | ICD-10-CM

## 2020-02-20 DIAGNOSIS — I10 Essential (primary) hypertension: Secondary | ICD-10-CM | POA: Diagnosis not present

## 2020-03-10 ENCOUNTER — Other Ambulatory Visit: Payer: Self-pay | Admitting: Family Medicine

## 2020-03-10 DIAGNOSIS — E785 Hyperlipidemia, unspecified: Secondary | ICD-10-CM

## 2020-03-10 DIAGNOSIS — E1169 Type 2 diabetes mellitus with other specified complication: Secondary | ICD-10-CM

## 2020-03-21 ENCOUNTER — Encounter (INDEPENDENT_AMBULATORY_CARE_PROVIDER_SITE_OTHER): Payer: Medicare Other | Admitting: Ophthalmology

## 2020-03-21 ENCOUNTER — Other Ambulatory Visit: Payer: Self-pay

## 2020-03-21 DIAGNOSIS — H35033 Hypertensive retinopathy, bilateral: Secondary | ICD-10-CM | POA: Diagnosis not present

## 2020-03-21 DIAGNOSIS — H34832 Tributary (branch) retinal vein occlusion, left eye, with macular edema: Secondary | ICD-10-CM

## 2020-03-21 DIAGNOSIS — H33302 Unspecified retinal break, left eye: Secondary | ICD-10-CM | POA: Diagnosis not present

## 2020-03-21 DIAGNOSIS — I10 Essential (primary) hypertension: Secondary | ICD-10-CM | POA: Diagnosis not present

## 2020-03-21 DIAGNOSIS — H43813 Vitreous degeneration, bilateral: Secondary | ICD-10-CM | POA: Diagnosis not present

## 2020-04-03 DIAGNOSIS — L304 Erythema intertrigo: Secondary | ICD-10-CM | POA: Diagnosis not present

## 2020-04-09 ENCOUNTER — Other Ambulatory Visit: Payer: Self-pay

## 2020-04-09 ENCOUNTER — Telehealth: Payer: Self-pay | Admitting: Family Medicine

## 2020-04-09 MED ORDER — IPRATROPIUM BROMIDE 0.06 % NA SOLN: NASAL | 1 refills | Status: DC

## 2020-04-09 NOTE — Telephone Encounter (Signed)
Pt is calling in needing a refill on ipratropium (ATROVENT) 0.06 % nasal spray.  [facility] [place]

## 2020-04-09 NOTE — Telephone Encounter (Signed)
Refill sent to requested pharmacy.

## 2020-04-25 ENCOUNTER — Ambulatory Visit (INDEPENDENT_AMBULATORY_CARE_PROVIDER_SITE_OTHER): Payer: Medicare Other | Admitting: Podiatry

## 2020-04-25 ENCOUNTER — Encounter: Payer: Self-pay | Admitting: Podiatry

## 2020-04-25 ENCOUNTER — Other Ambulatory Visit: Payer: Self-pay

## 2020-04-25 DIAGNOSIS — B351 Tinea unguium: Secondary | ICD-10-CM

## 2020-04-25 DIAGNOSIS — E119 Type 2 diabetes mellitus without complications: Secondary | ICD-10-CM

## 2020-04-25 DIAGNOSIS — M79675 Pain in left toe(s): Secondary | ICD-10-CM

## 2020-04-25 DIAGNOSIS — M79674 Pain in right toe(s): Secondary | ICD-10-CM

## 2020-04-25 NOTE — Progress Notes (Signed)
This patient returns to my office for at risk foot care.  This patient requires this care by a professional since this patient will be at risk due to having type 2 diabetes.  This patient is unable to cut nails herself since the patient cannot reach her nails.These nails are painful walking and wearing shoes.  This patient presents for at risk foot care today.  General Appearance  Alert, conversant and in no acute stress.  Vascular  Dorsalis pedis and posterior tibial  pulses are palpable  bilaterally.  Capillary return is within normal limits  bilaterally. Temperature is within normal limits  bilaterally.  Neurologic  Senn-Weinstein monofilament wire test within normal limits  bilaterally. Muscle power within normal limits bilaterally.  Nails Thick disfigured discolored nails with subungual debris  from hallux to fifth toes bilaterally. No evidence of bacterial infection or drainage bilaterally.  Orthopedic  No limitations of motion  feet .  No crepitus or effusions noted.  HAV  B/L.  Hammer toes  B/L.  Skin  normotropic skin with no porokeratosis noted bilaterally.  No signs of infections or ulcers noted.     Onychomycosis  Pain in right toes  Pain in left toes  Consent was obtained for treatment procedures.   Mechanical debridement of nails 1-5  bilaterally performed with a nail nipper.  Filed with dremel without incident.    Return office visit  3 months                   Told patient to return for periodic foot care and evaluation due to potential at risk complications.   Gardiner Barefoot DPM

## 2020-04-26 ENCOUNTER — Encounter (INDEPENDENT_AMBULATORY_CARE_PROVIDER_SITE_OTHER): Payer: Medicare Other | Admitting: Ophthalmology

## 2020-04-26 DIAGNOSIS — H33302 Unspecified retinal break, left eye: Secondary | ICD-10-CM | POA: Diagnosis not present

## 2020-04-26 DIAGNOSIS — H43813 Vitreous degeneration, bilateral: Secondary | ICD-10-CM

## 2020-04-26 DIAGNOSIS — I10 Essential (primary) hypertension: Secondary | ICD-10-CM

## 2020-04-26 DIAGNOSIS — H35033 Hypertensive retinopathy, bilateral: Secondary | ICD-10-CM

## 2020-04-26 DIAGNOSIS — H34832 Tributary (branch) retinal vein occlusion, left eye, with macular edema: Secondary | ICD-10-CM

## 2020-04-27 ENCOUNTER — Other Ambulatory Visit: Payer: Self-pay | Admitting: Family Medicine

## 2020-05-20 ENCOUNTER — Other Ambulatory Visit: Payer: Self-pay | Admitting: Family Medicine

## 2020-06-01 ENCOUNTER — Other Ambulatory Visit: Payer: Self-pay | Admitting: Family Medicine

## 2020-06-01 DIAGNOSIS — E1169 Type 2 diabetes mellitus with other specified complication: Secondary | ICD-10-CM

## 2020-06-06 ENCOUNTER — Encounter (INDEPENDENT_AMBULATORY_CARE_PROVIDER_SITE_OTHER): Payer: Medicare Other | Admitting: Ophthalmology

## 2020-06-06 ENCOUNTER — Other Ambulatory Visit: Payer: Self-pay

## 2020-06-06 DIAGNOSIS — H34832 Tributary (branch) retinal vein occlusion, left eye, with macular edema: Secondary | ICD-10-CM

## 2020-06-06 DIAGNOSIS — H35033 Hypertensive retinopathy, bilateral: Secondary | ICD-10-CM

## 2020-06-06 DIAGNOSIS — I10 Essential (primary) hypertension: Secondary | ICD-10-CM | POA: Diagnosis not present

## 2020-06-06 DIAGNOSIS — H33302 Unspecified retinal break, left eye: Secondary | ICD-10-CM | POA: Diagnosis not present

## 2020-06-06 DIAGNOSIS — H43813 Vitreous degeneration, bilateral: Secondary | ICD-10-CM | POA: Diagnosis not present

## 2020-06-12 ENCOUNTER — Encounter: Payer: Self-pay | Admitting: Family Medicine

## 2020-06-12 ENCOUNTER — Other Ambulatory Visit: Payer: Self-pay

## 2020-06-12 ENCOUNTER — Ambulatory Visit (INDEPENDENT_AMBULATORY_CARE_PROVIDER_SITE_OTHER): Payer: Medicare Other | Admitting: Family Medicine

## 2020-06-12 VITALS — BP 150/80 | HR 88 | Temp 97.8°F | Wt 159.9 lb

## 2020-06-12 DIAGNOSIS — S40021A Contusion of right upper arm, initial encounter: Secondary | ICD-10-CM

## 2020-06-12 DIAGNOSIS — S0083XA Contusion of other part of head, initial encounter: Secondary | ICD-10-CM

## 2020-06-12 DIAGNOSIS — S8002XA Contusion of left knee, initial encounter: Secondary | ICD-10-CM

## 2020-06-12 DIAGNOSIS — E119 Type 2 diabetes mellitus without complications: Secondary | ICD-10-CM

## 2020-06-12 LAB — POCT GLYCOSYLATED HEMOGLOBIN (HGB A1C): Hemoglobin A1C: 5.7 % — AB (ref 4.0–5.6)

## 2020-06-12 NOTE — Progress Notes (Signed)
Established Patient Office Visit  Subjective:  Patient ID: Linda Thompson, female    DOB: 10-Feb-1955  Age: 65 y.o. MRN: [id number]  CC:  Chief Complaint  Patient presents with  . Fall    Last Wednesday, fell and hit her R arm and chin, R ear pain when chewing    HPI Linda Thompson presents for recent fall with several injuries including bruising on the chin, right facial pain, left anterior knee contusion, and right arm contusion.  She states that this occurred last Wednesday.  She was in her kitchen as not sure exactly how she fell.  She is not aware of any obvious syncopal episode.  Someone had question whether this could have been hypoglycemia.  She does have type 2 diabetes on metformin but not any medications that should have triggered hypoglycemia.  She does not recall any confusion.  No major head injury.  Ambulating without difficulty.  Relatively minor soreness involving right arm and left knee.  Does have some pain with chewing.  No visible facial swelling.  She has some mild bruising to the chin region.  No recent monitoring of glucose.  She is on metformin.  Last A1c last August was 5.7.  No polyuria or polydipsia.  Denies any headaches or confusion at this time.  No right shoulder pain.  No hip or knee pain with ambulation  Past Medical History:  Diagnosis Date  . Allergic rhinitis   . Allergy   . Anemia   . Chronic low back pain   . DDD (degenerative disc disease), lumbar   . GERD (gastroesophageal reflux disease)    w/ hx diaphragmatic hernia  . Glaucoma   . GLAUCOMA NOS 03/11/2006   Qualifier: Diagnosis of  By: Diona Browner MD, Amy    . Heart murmur   . Hx of fracture    R arm, foot  . Hyperlipemia   . Hypertension   . IBS (irritable bowel syndrome)   . Low back pain 11/22/2012  . Meningioma 12/20/2001   Repeat MR from 2017 - radiology feels not a meningioma, but rather "Thickening of the anterior falx appears to be related to degenerative ossification and not  meningioma." HCPOA opted not to do repeat imaging with CT for the possible hemangioma and declined further evaluation of this.  . Meningioma ([place])    ant falx, stable on prior imaging  . Moderate intellectual disabilities   . Murmur   . Obesity   . Osteoarthrosis, unspecified whether generalized or localized, ankle and foot   . Sarcoidosis    difuse bony lesions and LDA, dx by biopsy in 2016  . Transaminitis   . Type II or unspecified type diabetes mellitus without mention of complication, not stated as uncontrolled     Past Surgical History:  Procedure Laterality Date  . CATARACT EXTRACTION Bilateral    Dr Katy Fitch  . COLONOSCOPY  04/15/2011  . ELBOW SURGERY     right elbow  . LIPOMA EXCISION Left [id number]   posterior left axilla  . TOTAL ABDOMINAL HYSTERECTOMY  03/05/1992   leiomyoma and cellular leiomyoma    Family History  Problem Relation Age of Onset  . Diabetes Mother   . Stroke Mother   . Hypertension Mother   . Stroke Father   . Diabetes Maternal Aunt   . Heart disease Maternal Aunt         (had pacemaker)  . Diabetes Maternal Grandmother   . Stroke Maternal Grandmother   . Hypertension  Maternal Grandmother   . Colon cancer Neg Hx     Social History   Socioeconomic History  . Marital status: Single    Spouse name: Not on file  . Number of children: 0  . Years of education: Not on file  . Highest education level: Not on file  Occupational History  . Occupation: Disabled (from arm fracture)  Tobacco Use  . Smoking status: Never Smoker  . Smokeless tobacco: Never Used  Vaping Use  . Vaping Use: Never used  Substance and Sexual Activity  . Alcohol use: No    Alcohol/week: 0.0 standard drinks  . Drug use: No  . Sexual activity: Never    Birth control/protection: Surgical    Comment: Hyst--TAH--unsure if has ovaries  Other Topics Concern  . Not on file  Social History Narrative         Currently-disability for arm fracture      Mental Retardation,  but is able to drive to places she is comfortable with and prepare her own meals and manage most of her own affairs.      [name] (neighbor) is POA/HCPOA      Reports she gets regular exercise and tries to eat healthy   Social Determinants of Health   Financial Resource Strain: Low Risk   . Difficulty of Paying Living Expenses: Not hard at all  Food Insecurity: No Food Insecurity  . Worried About Charity fundraiser in the Last Year: Never true  . Ran Out of Food in the Last Year: Never true  Transportation Needs: No Transportation Needs  . Lack of Transportation (Medical): No  . Lack of Transportation (Non-Medical): No  Physical Activity: Inactive  . Days of Exercise per Week: 0 days  . Minutes of Exercise per Session: 0 min  Stress: No Stress Concern Present  . Feeling of Stress : Not at all  Social Connections: Moderately Isolated  . Frequency of Communication with Friends and Family: Once a week  . Frequency of Social Gatherings with Friends and Family: More than three times a week  . Attends Religious Services: More than 4 times per year  . Active Member of Clubs or Organizations: No  . Attends Archivist Meetings: Never  . Marital Status: Never married  Intimate Partner Violence: Not At Risk  . Fear of Current or Ex-Partner: No  . Emotionally Abused: No  . Physically Abused: No  . Sexually Abused: No    Outpatient Medications Prior to Visit  Medication Sig Dispense Refill  . acetaminophen (TYLENOL) 500 MG tablet Take 500 mg by mouth every 6 (six) hours as needed for mild pain.     Marland Kitchen B-12 MICROLOZENGE 500 MCG SUBL Place under the tongue.    Marland Kitchen Besifloxacin HCl (BESIVANCE) 0.6 % SUSP Apply to eye. 4 times each day for 2 days    . [facility] B-12 500 MCG tablet TAKE 1 TABLET BY MOUTH DAILY 100 tablet 0  . diclofenac sodium (VOLTAREN) 1 % GEL APPLY 2 GRAMS TO AFFECTED AREA 4 TIMES A DAY (Patient taking differently: Apply 2 g topically 4 (four) times daily.) 100 g 1   . diltiazem (CARDIZEM CD) 240 MG 24 hr capsule TAKE 1 CAPSULE BY MOUTH EVERY DAY 90 capsule 3  . dorzolamide-timolol (COSOPT) 22.3-6.8 MG/ML ophthalmic solution SMARTSIG:In Eye(s)    . ferrous sulfate 325 (65 FE) MG tablet Take 325 mg by mouth 3 (three) times daily with meals.     . fluticasone (FLONASE) 50  MCG/ACT nasal spray IINSTILL 2 SPRAYS INTO EACH NOSTRIL AT BEDTIME 48 mL 1  . hydrochlorothiazide (MICROZIDE) 12.5 MG capsule TAKE 1 CAPSULE BY MOUTH EVERY DAY 90 capsule 3  . hydrocortisone 2.5 % ointment APPLY TO AFFECTED AREA TWICE DAILY MONDAY THRU FRIDAY ONLY    . ipratropium (ATROVENT) 0.06 % nasal spray INHALE 1 SPRAY INTO BOTH NOSTRILS 4 TIMES DAILY 15 mL 2  . ketoconazole (NIZORAL) 2 % cream SMARTSIG:1 Application Topical 1 to 2 Times Daily    . ketoconazole (NIZORAL) 2 % shampoo APPLY TO AFFECTED AREA TWICE A WEEK AS DIRECTED 120 mL 1  . latanoprost (XALATAN) 0.005 % ophthalmic solution Place 1 drop into both eyes at bedtime.     Marland Kitchen loratadine (CLARITIN) 10 MG tablet Take 1 tablet (10 mg total) by mouth daily. 90 tablet 1  . lovastatin (MEVACOR) 10 MG tablet TAKE 2 TABLETS BY MOUTH AT BEDTIME 180 tablet 0  . metFORMIN (GLUCOPHAGE) 1000 MG tablet TAKE 1 TABLET TWICE A DAY WITH A MEAL 180 tablet 3  . mometasone (ELOCON) 0.1 % ointment APPLY A SMALL AMOUNT TO AFFECTED AREA AS DIRECTED 15 g 3  . omeprazole (PRILOSEC) 20 MG capsule TAKE 1 CAPSULE BY MOUTH TWICE A DAY 180 capsule 1  . Vitamin D, Cholecalciferol, 25 MCG (1000 UT) CAPS Take 1,000 Units by mouth daily.    . Vitamins-Lipotropics ([facility] INNER EAR PLUS PO) Take 2 tablets by mouth 2 (two) times daily.      No facility-administered medications prior to visit.    Allergies  Allergen Reactions  . Ace Inhibitors Cough  . Amoxicillin-Pot Clavulanate Diarrhea  . Augmentin [Amoxicillin-Pot Clavulanate] Other (See Comments)    diarrhea  . Azithromycin Diarrhea  . Ceftin [Cefuroxime Axetil]     diarrhea  . Cefuroxime Diarrhea and  Other (See Comments)    Generalized weakness  . Cyclobenzaprine Hcl Diarrhea  . Flexeril [Cyclobenzaprine] Diarrhea  . Flonase [Fluticasone Propionate] Other (See Comments)    Nose bleed  . Ibuprofen Nausea And Vomiting    ROS Review of Systems  Constitutional: Negative for chills and fever.  Respiratory: Negative for shortness of breath.   Cardiovascular: Negative for chest pain.  Neurological: Negative for syncope, weakness and headaches.  Psychiatric/Behavioral: Negative for confusion.      Objective:    Physical Exam Vitals reviewed.  Constitutional:      Appearance: Normal appearance.  HENT:     Head:     Comments: She has full range of motion TMJ joint bilaterally with no localized tenderness or visible swelling of the TMJ joint.    Right Ear: Tympanic membrane normal.     Left Ear: Tympanic membrane normal.  Cardiovascular:     Rate and Rhythm: Normal rate and regular rhythm.  Pulmonary:     Effort: Pulmonary effort is normal.     Breath sounds: Normal breath sounds.  Musculoskeletal:     Comments: She has very superficial abrasion left knee with no signs of secondary flexion.  She has some ecchymosis but no bony tenderness.  Full range of motion left knee with no effusion.  Right arm along the mid inner aspect reveals area of ecchymosis which is about 8 x 10 but no hematoma.  Full range of motion right shoulder.  No bony tenderness upper extremity.  She has mild bruising of the chin region but no bony tenderness of the mandible  Neurological:     Mental Status: She is alert.     BP Marland Kitchen)  150/80 (BP Location: Left Arm, Patient Position: Sitting, Cuff Size: Normal)   Pulse 88   Temp 97.8 F (36.6 C) (Oral)   Wt 159 lb 14.4 oz (72.5 kg)   LMP 01/21/1992 (Approximate)   SpO2 97%   BMI 29.25 kg/m  Wt Readings from Last 3 Encounters:  06/12/20 159 lb 14.4 oz (72.5 kg)  09/14/19 166 lb (75.3 kg)  08/18/19 167 lb (75.8 kg)     Health Maintenance Due  Topic  Date Due  . COLONOSCOPY (Pts 45-13yrs Insurance coverage will need to be confirmed)  04/18/2018  . PAP SMEAR-Modifier  01/17/2019  . COVID-19 Vaccine (3 - Pfizer risk 4-dose series) 04/19/2019  . DEXA SCAN  Never done  . MAMMOGRAM  10/20/2019    There are no preventive care reminders to display for this patient.  Lab Results  Component Value Date   TSH 2.09 11/10/2018   Lab Results  Component Value Date   WBC 9.6 11/10/2018   HGB 15.8 (H) 11/10/2018   HCT 48.8 (H) 11/10/2018   MCV 90.8 11/10/2018   PLT 348.0 11/10/2018   Lab Results  Component Value Date   NA 144 11/10/2018   K 4.0 11/10/2018   CO2 30 11/10/2018   GLUCOSE 103 (H) 11/10/2018   BUN 11 11/10/2018   CREATININE 0.58 11/10/2018   BILITOT 0.2 11/10/2018   ALKPHOS 94 11/10/2018   AST 20 11/10/2018   ALT 26 11/10/2018   PROT 6.5 11/10/2018   ALBUMIN 4.2 11/10/2018   CALCIUM 9.4 11/10/2018   ANIONGAP 11 03/16/2018   GFR 104.57 11/10/2018   Lab Results  Component Value Date   CHOL 180 11/10/2018   Lab Results  Component Value Date   HDL 37.80 (L) 11/10/2018   Lab Results  Component Value Date   LDLCALC 104 (H) 11/10/2018   Lab Results  Component Value Date   TRIG 190.0 (H) 11/10/2018   Lab Results  Component Value Date   CHOLHDL 5 11/10/2018   Lab Results  Component Value Date   HGBA1C 5.7 (A) 06/12/2020      Assessment & Plan:   #1 recent fall with contusions involving right arm, right chin, and left knee.  Low clinical suspicion for any bony injury.  Ambulating without difficulty.  Does have some mild right facial tenderness with chewing but no localized tenderness to palpation.  No evidence for malalignment of the TMJ joint.  -Reassurance and observation for now. -Patient had questions regarding whether she could have had hypoglycemia.  We explained with her being on metformin and no other hypoglycemic medications this would be very unlikely.  There is not any history of clear  syncope  #2 type 2 diabetes well controlled with A1c 5.7% -Continue metformin and she is encouraged to set up follow-up with her primary soon to reassess  No orders of the defined types were placed in this encounter.   Follow-up: No follow-ups on file.    Carolann Littler, MD

## 2020-06-12 NOTE — Patient Instructions (Signed)
Your A1C is 5.7% which is excellent diabetes control  Metformin should not cause severe low blood sugar- so doubt recent low blood sugar event  Suspect you have several bruises- chin, right arm, left knee but low suspicion for fracture.    Set up follow up with Dr Volanda Napoleon soon.

## 2020-06-28 ENCOUNTER — Ambulatory Visit: Payer: Medicare Other | Admitting: Family Medicine

## 2020-07-06 DIAGNOSIS — E119 Type 2 diabetes mellitus without complications: Secondary | ICD-10-CM | POA: Diagnosis not present

## 2020-07-06 DIAGNOSIS — K317 Polyp of stomach and duodenum: Secondary | ICD-10-CM | POA: Diagnosis not present

## 2020-07-11 ENCOUNTER — Encounter (INDEPENDENT_AMBULATORY_CARE_PROVIDER_SITE_OTHER): Payer: Medicare Other | Admitting: Ophthalmology

## 2020-07-11 ENCOUNTER — Other Ambulatory Visit: Payer: Self-pay

## 2020-07-11 DIAGNOSIS — H33302 Unspecified retinal break, left eye: Secondary | ICD-10-CM | POA: Diagnosis not present

## 2020-07-11 DIAGNOSIS — I10 Essential (primary) hypertension: Secondary | ICD-10-CM

## 2020-07-11 DIAGNOSIS — H34832 Tributary (branch) retinal vein occlusion, left eye, with macular edema: Secondary | ICD-10-CM

## 2020-07-11 DIAGNOSIS — H35033 Hypertensive retinopathy, bilateral: Secondary | ICD-10-CM

## 2020-07-11 DIAGNOSIS — H43813 Vitreous degeneration, bilateral: Secondary | ICD-10-CM

## 2020-07-24 ENCOUNTER — Other Ambulatory Visit: Payer: Self-pay | Admitting: Family Medicine

## 2020-07-25 ENCOUNTER — Ambulatory Visit: Payer: Medicare Other | Admitting: Podiatry

## 2020-07-31 ENCOUNTER — Telehealth: Payer: Self-pay | Admitting: Family Medicine

## 2020-07-31 MED ORDER — FLUTICASONE PROPIONATE 50 MCG/ACT NA SUSP: NASAL | 1 refills | Status: DC

## 2020-07-31 MED ORDER — IPRATROPIUM BROMIDE 0.06 % NA SOLN: NASAL | 2 refills | Status: DC

## 2020-07-31 NOTE — Telephone Encounter (Signed)
Refills sent to pharmacy. 

## 2020-07-31 NOTE — Telephone Encounter (Signed)
Patient called in this morning to request refills of  ipratropium bromide nasal solution and fluticasone propionate nasal spray to be sent to [id number] - [place], [place] [address].  Please advise.

## 2020-08-15 DIAGNOSIS — Z23 Encounter for immunization: Secondary | ICD-10-CM | POA: Diagnosis not present

## 2020-08-22 ENCOUNTER — Other Ambulatory Visit: Payer: Self-pay

## 2020-08-22 ENCOUNTER — Ambulatory Visit (INDEPENDENT_AMBULATORY_CARE_PROVIDER_SITE_OTHER): Payer: Medicare Other | Admitting: Podiatry

## 2020-08-22 ENCOUNTER — Encounter: Payer: Self-pay | Admitting: Podiatry

## 2020-08-22 DIAGNOSIS — B351 Tinea unguium: Secondary | ICD-10-CM

## 2020-08-22 DIAGNOSIS — M79674 Pain in right toe(s): Secondary | ICD-10-CM | POA: Diagnosis not present

## 2020-08-22 DIAGNOSIS — M79675 Pain in left toe(s): Secondary | ICD-10-CM | POA: Diagnosis not present

## 2020-08-22 DIAGNOSIS — E119 Type 2 diabetes mellitus without complications: Secondary | ICD-10-CM

## 2020-08-22 NOTE — Progress Notes (Signed)
This patient returns to my office for at risk foot care.  This patient requires this care by a professional since this patient will be at risk due to having type 2 diabetes.  This patient is unable to cut nails herself since the patient cannot reach her nails.These nails are painful walking and wearing shoes.  This patient presents for at risk foot care today.  General Appearance  Alert, conversant and in no acute stress.  Vascular  Dorsalis pedis and posterior tibial  pulses are palpable  bilaterally.  Capillary return is within normal limits  bilaterally. Temperature is within normal limits  bilaterally.  Neurologic  Senn-Weinstein monofilament wire test within normal limits  bilaterally. Muscle power within normal limits bilaterally.  Nails Thick disfigured discolored nails with subungual debris  from hallux to fifth toes bilaterally. No evidence of bacterial infection or drainage bilaterally.  Orthopedic  No limitations of motion  feet .  No crepitus or effusions noted.  HAV  B/L.  Hammer toes  B/L.  Skin  normotropic skin with no porokeratosis noted bilaterally.  No signs of infections or ulcers noted.     Onychomycosis  Pain in right toes  Pain in left toes  Consent was obtained for treatment procedures.   Mechanical debridement of nails 1-5  bilaterally performed with a nail nipper.  Filed with dremel without incident.    Return office visit  3 months                   Told patient to return for periodic foot care and evaluation due to potential at risk complications. Padding for right hallux dispensed.   Gardiner Barefoot DPM

## 2020-08-28 ENCOUNTER — Other Ambulatory Visit: Payer: Self-pay | Admitting: Family Medicine

## 2020-08-28 DIAGNOSIS — H04123 Dry eye syndrome of bilateral lacrimal glands: Secondary | ICD-10-CM | POA: Diagnosis not present

## 2020-08-28 DIAGNOSIS — E785 Hyperlipidemia, unspecified: Secondary | ICD-10-CM

## 2020-08-28 DIAGNOSIS — Z961 Presence of intraocular lens: Secondary | ICD-10-CM | POA: Diagnosis not present

## 2020-08-28 DIAGNOSIS — E113212 Type 2 diabetes mellitus with mild nonproliferative diabetic retinopathy with macular edema, left eye: Secondary | ICD-10-CM | POA: Diagnosis not present

## 2020-08-28 DIAGNOSIS — I10 Essential (primary) hypertension: Secondary | ICD-10-CM

## 2020-08-28 DIAGNOSIS — H401131 Primary open-angle glaucoma, bilateral, mild stage: Secondary | ICD-10-CM | POA: Diagnosis not present

## 2020-08-28 DIAGNOSIS — E1169 Type 2 diabetes mellitus with other specified complication: Secondary | ICD-10-CM

## 2020-08-28 LAB — HM DIABETES EYE EXAM

## 2020-08-30 ENCOUNTER — Other Ambulatory Visit: Payer: Self-pay

## 2020-08-30 ENCOUNTER — Encounter (INDEPENDENT_AMBULATORY_CARE_PROVIDER_SITE_OTHER): Payer: Medicare Other | Admitting: Ophthalmology

## 2020-08-30 DIAGNOSIS — I10 Essential (primary) hypertension: Secondary | ICD-10-CM | POA: Diagnosis not present

## 2020-08-30 DIAGNOSIS — H35033 Hypertensive retinopathy, bilateral: Secondary | ICD-10-CM

## 2020-08-30 DIAGNOSIS — H33302 Unspecified retinal break, left eye: Secondary | ICD-10-CM | POA: Diagnosis not present

## 2020-08-30 DIAGNOSIS — H43813 Vitreous degeneration, bilateral: Secondary | ICD-10-CM

## 2020-08-30 DIAGNOSIS — H34832 Tributary (branch) retinal vein occlusion, left eye, with macular edema: Secondary | ICD-10-CM

## 2020-09-07 ENCOUNTER — Telehealth: Payer: Self-pay

## 2020-09-07 ENCOUNTER — Ambulatory Visit (INDEPENDENT_AMBULATORY_CARE_PROVIDER_SITE_OTHER): Payer: Medicare Other

## 2020-09-07 DIAGNOSIS — Z0000 Encounter for general adult medical examination without abnormal findings: Secondary | ICD-10-CM | POA: Diagnosis not present

## 2020-09-07 DIAGNOSIS — Z1231 Encounter for screening mammogram for malignant neoplasm of breast: Secondary | ICD-10-CM

## 2020-09-07 NOTE — Telephone Encounter (Signed)
Telephone [place] visit  with patient. Patient request a glucometer to monitor blood sugar. Patient states she does not have a glucometer or  diabetic testing supplies.   L.Mitchell Epling,LPN

## 2020-09-07 NOTE — Patient Instructions (Addendum)
Ms. Linda Thompson , Thank you for taking time to come for your Medicare Wellness Visit. I appreciate your ongoing commitment to your health goals. Please review the following plan we discussed and let me know if I can assist you in the future.   Screening recommendations/referrals: Colonoscopy: patient declines  Mammogram: referral completed 09/07/2020 Bone Density: patient declines  Recommended yearly ophthalmology/optometry visit for glaucoma screening and checkup Recommended yearly dental visit for hygiene and checkup  Vaccinations: Influenza vaccine: due in the fall 2022  Pneumococcal vaccine: completed series  Tdap vaccine: 08/08/2014 Shingles vaccine: completed series     Advanced directives: copies on file   Conditions/risks identified: patient needs glucometer to check BS   Next appointment: none    Preventive Care 4 Years and Older, Female Preventive care refers to lifestyle choices and visits with your health care provider that can promote health and wellness. What does preventive care include? A yearly physical exam. This is also called an annual well check. Dental exams once or twice a year. Routine eye exams. Ask your health care provider how often you should have your eyes checked. Personal lifestyle choices, including: Daily care of your teeth and gums. Regular physical activity. Eating a healthy diet. Avoiding tobacco and drug use. Limiting alcohol use. Practicing safe sex. Taking low-dose aspirin every day. Taking vitamin and mineral supplements as recommended by your health care provider. What happens during an annual well check? The services and screenings done by your health care provider during your annual well check will depend on your age, overall health, lifestyle risk factors, and family history of disease. Counseling  Your health care provider may ask you questions about your: Alcohol use. Tobacco use. Drug use. Emotional well-being. Home and  relationship well-being. Sexual activity. Eating habits. History of falls. Memory and ability to understand (cognition). Work and work Statistician. Reproductive health. Screening  You may have the following tests or measurements: Height, weight, and BMI. Blood pressure. Lipid and cholesterol levels. These may be checked every 5 years, or more frequently if you are over 3 years old. Skin check. Lung cancer screening. You may have this screening every year starting at age 23 if you have a 30-pack-year history of smoking and currently smoke or have quit within the past 15 years. Fecal occult blood test (FOBT) of the stool. You may have this test every year starting at age 52. Flexible sigmoidoscopy or colonoscopy. You may have a sigmoidoscopy every 5 years or a colonoscopy every 10 years starting at age 82. Hepatitis C blood test. Hepatitis B blood test. Sexually transmitted disease (STD) testing. Diabetes screening. This is done by checking your blood sugar (glucose) after you have not eaten for a while (fasting). You may have this done every 1-3 years. Bone density scan. This is done to screen for osteoporosis. You may have this done starting at age 63. Mammogram. This may be done every 1-2 years. Talk to your health care provider about how often you should have regular mammograms. Talk with your health care provider about your test results, treatment options, and if necessary, the need for more tests. Vaccines  Your health care provider may recommend certain vaccines, such as: Influenza vaccine. This is recommended every year. Tetanus, diphtheria, and acellular pertussis (Tdap, Td) vaccine. You may need a Td booster every 10 years. Zoster vaccine. You may need this after age 71. Pneumococcal 13-valent conjugate (PCV13) vaccine. One dose is recommended after age 38. Pneumococcal polysaccharide (PPSV23) vaccine. One dose is recommended after  age 34. Talk to your health care provider  about which screenings and vaccines you need and how often you need them. This information is not intended to replace advice given to you by your health care provider. Make sure you discuss any questions you have with your health care provider. Document Released: 02/02/2015 Document Revised: 09/26/2015 Document Reviewed: 11/07/2014 Elsevier Interactive Patient Education  2017 [place] Prevention in the Home Falls can cause injuries. They can happen to people of all ages. There are many things you can do to make your home safe and to help prevent falls. What can I do on the outside of my home? Regularly fix the edges of walkways and driveways and fix any cracks. Remove anything that might make you trip as you walk through a door, such as a raised step or threshold. Trim any bushes or trees on the path to your home. Use bright outdoor lighting. Clear any walking paths of anything that might make someone trip, such as rocks or tools. Regularly check to see if handrails are loose or broken. Make sure that both sides of any steps have handrails. Any raised decks and porches should have guardrails on the edges. Have any leaves, snow, or ice cleared regularly. Use sand or salt on walking paths during winter. Clean up any spills in your garage right away. This includes oil or grease spills. What can I do in the bathroom? Use night lights. Install grab bars by the toilet and in the tub and shower. Do not use towel bars as grab bars. Use non-skid mats or decals in the tub or shower. If you need to sit down in the shower, use a plastic, non-slip stool. Keep the floor dry. Clean up any water that spills on the floor as soon as it happens. Remove soap buildup in the tub or shower regularly. Attach bath mats securely with double-sided non-slip rug tape. Do not have throw rugs and other things on the floor that can make you trip. What can I do in the bedroom? Use night lights. Make sure  that you have a light by your bed that is easy to reach. Do not use any sheets or blankets that are too big for your bed. They should not hang down onto the floor. Have a firm chair that has side arms. You can use this for support while you get dressed. Do not have throw rugs and other things on the floor that can make you trip. What can I do in the kitchen? Clean up any spills right away. Avoid walking on wet floors. Keep items that you use a lot in easy-to-reach places. If you need to reach something above you, use a strong step stool that has a grab bar. Keep electrical cords out of the way. Do not use floor polish or wax that makes floors slippery. If you must use wax, use non-skid floor wax. Do not have throw rugs and other things on the floor that can make you trip. What can I do with my stairs? Do not leave any items on the stairs. Make sure that there are handrails on both sides of the stairs and use them. Fix handrails that are broken or loose. Make sure that handrails are as long as the stairways. Check any carpeting to make sure that it is firmly attached to the stairs. Fix any carpet that is loose or worn. Avoid having throw rugs at the top or bottom of the stairs. If you do  have throw rugs, attach them to the floor with carpet tape. Make sure that you have a light switch at the top of the stairs and the bottom of the stairs. If you do not have them, ask someone to add them for you. What else can I do to help prevent falls? Wear shoes that: Do not have high heels. Have rubber bottoms. Are comfortable and fit you well. Are closed at the toe. Do not wear sandals. If you use a stepladder: Make sure that it is fully opened. Do not climb a closed stepladder. Make sure that both sides of the stepladder are locked into place. Ask someone to hold it for you, if possible. Clearly mark and make sure that you can see: Any grab bars or handrails. First and last steps. Where the edge of  each step is. Use tools that help you move around (mobility aids) if they are needed. These include: Canes. Walkers. Scooters. Crutches. Turn on the lights when you go into a dark area. Replace any light bulbs as soon as they burn out. Set up your furniture so you have a clear path. Avoid moving your furniture around. If any of your floors are uneven, fix them. If there are any pets around you, be aware of where they are. Review your medicines with your doctor. Some medicines can make you feel dizzy. This can increase your chance of falling. Ask your doctor what other things that you can do to help prevent falls. This information is not intended to replace advice given to you by your health care provider. Make sure you discuss any questions you have with your health care provider. Document Released: 11/02/2008 Document Revised: 06/14/2015 Document Reviewed: 02/10/2014 Elsevier Interactive Patient Education  2017 [employer].

## 2020-09-07 NOTE — Progress Notes (Addendum)
Subjective:   Linda Thompson is a 65 y.o. female who presents for Medicare Annual (Subsequent) preventive examination.   I connected with Lianny Ollar today by telephone and verified that I am speaking with the correct person using two identifiers. Location patient: home Location provider: work Persons participating in the virtual visit: patient, provider.   I discussed the limitations, risks, security and privacy concerns of performing an evaluation and management service by telephone and the availability of in person appointments. I also discussed with the patient that there may be a patient responsible charge related to this service. The patient expressed understanding and verbally consented to this telephonic visit.    Interactive audio and video telecommunications were attempted between this provider and patient, however failed, due to patient having technical difficulties OR patient did not have access to video capability.  We continued and completed visit with audio only.    Review of Systems    N/a       Objective:    There were no vitals filed for this visit. There is no height or weight on file to calculate BMI.  Advanced Directives 09/07/2019 12/22/2017 11/29/2017 12/19/2016 02/12/2016 01/03/2016 12/11/2015  Does Patient Have a Medical Advance Directive? Yes Yes Yes Yes No Yes No  Type of Advance Directive Living will - Living will;Healthcare Power of [place];Living will;Out of facility DNR (pink MOST or yellow form) [place];Living will  Does patient want to make changes to medical advance directive? No - Patient declined - No - Patient declined - - No - Patient declined No - Patient declined  Copy of [place] in Chart? - - No - copy requested - - Yes -  Would patient like information on creating a medical advance directive? - - - - No - Patient declined - No - Patient declined    Current  Medications (verified) Outpatient Encounter Medications as of 09/07/2020  Medication Sig   acetaminophen (TYLENOL) 500 MG tablet Take 500 mg by mouth every 6 (six) hours as needed for mild pain.    B-12 MICROLOZENGE 500 MCG SUBL Place under the tongue.   Besifloxacin HCl (BESIVANCE) 0.6 % SUSP Apply to eye. 4 times each day for 2 days   [facility] B-12 500 MCG tablet TAKE 1 TABLET BY MOUTH DAILY   diclofenac sodium (VOLTAREN) 1 % GEL APPLY 2 GRAMS TO AFFECTED AREA 4 TIMES A DAY (Patient taking differently: Apply 2 g topically 4 (four) times daily.)   diltiazem (CARDIZEM CD) 240 MG 24 hr capsule TAKE 1 CAPSULE BY MOUTH EVERY DAY   dorzolamide-timolol (COSOPT) 22.3-6.8 MG/ML ophthalmic solution SMARTSIG:In Eye(s)   ferrous sulfate 325 (65 FE) MG tablet Take 325 mg by mouth 3 (three) times daily with meals.    fluticasone (FLONASE) 50 MCG/ACT nasal spray IINSTILL 2 SPRAYS INTO EACH NOSTRIL AT BEDTIME   hydrochlorothiazide (MICROZIDE) 12.5 MG capsule TAKE 1 CAPSULE BY MOUTH EVERY DAY   hydrocortisone 2.5 % ointment APPLY TO AFFECTED AREA TWICE DAILY MONDAY THRU FRIDAY ONLY   ipratropium (ATROVENT) 0.06 % nasal spray INHALE 1 SPRAY INTO BOTH NOSTRILS 4 TIMES DAILY   ketoconazole (NIZORAL) 2 % cream SMARTSIG:1 Application Topical 1 to 2 Times Daily   ketoconazole (NIZORAL) 2 % shampoo USE AS DIRECTED TWICE A WEEK AS DIRECTED   latanoprost (XALATAN) 0.005 % ophthalmic solution Place 1 drop into both eyes at bedtime.    loratadine (CLARITIN) 10 MG tablet Take 1  tablet (10 mg total) by mouth daily.   lovastatin (MEVACOR) 10 MG tablet TAKE 2 TABLETS BY MOUTH AT BEDTIME   metFORMIN (GLUCOPHAGE) 1000 MG tablet TAKE 1 TABLET TWICE A DAY WITH A MEAL   mometasone (ELOCON) 0.1 % ointment APPLY A SMALL AMOUNT TO AFFECTED AREA AS DIRECTED   omeprazole (PRILOSEC) 20 MG capsule TAKE 1 CAPSULE BY MOUTH TWICE A DAY   Vitamin D, Cholecalciferol, 25 MCG (1000 UT) CAPS Take 1,000 Units by mouth daily.   Vitamins-Lipotropics  ([facility] INNER EAR PLUS PO) Take 2 tablets by mouth 2 (two) times daily.    [DISCONTINUED] diltiazem (TIAZAC) 240 MG 24 hr capsule Take 1 capsule (240 mg total) by mouth daily.   No facility-administered encounter medications on file as of 09/07/2020.    Allergies (verified) Ace inhibitors, Amoxicillin-pot clavulanate, Augmentin [amoxicillin-pot clavulanate], Azithromycin, Ceftin [cefuroxime axetil], Cefuroxime, Cyclobenzaprine hcl, Flexeril [cyclobenzaprine], Flonase [fluticasone propionate], and Ibuprofen   History: Past Medical History:  Diagnosis Date   Allergic rhinitis    Allergy    Anemia    Chronic low back pain    DDD (degenerative disc disease), lumbar    GERD (gastroesophageal reflux disease)    w/ hx diaphragmatic hernia   Glaucoma    GLAUCOMA NOS 03/11/2006   Qualifier: Diagnosis of  By: Diona Browner MD, Amy     Heart murmur    Hx of fracture    R arm, foot   Hyperlipemia    Hypertension    IBS (irritable bowel syndrome)    Low back pain 11/22/2012   Meningioma 12/20/2001   Repeat MR from 2017 - radiology feels not a meningioma, but rather "Thickening of the anterior falx appears to be related to degenerative ossification and not meningioma." HCPOA opted not to do repeat imaging with CT for the possible hemangioma and declined further evaluation of this.   Meningioma ([place])    ant falx, stable on prior imaging   Moderate intellectual disabilities    Murmur    Obesity    Osteoarthrosis, unspecified whether generalized or localized, ankle and foot    Sarcoidosis    difuse bony lesions and LDA, dx by biopsy in 2016   Transaminitis    Type II or unspecified type diabetes mellitus without mention of complication, not stated as uncontrolled    Past Surgical History:  Procedure Laterality Date   CATARACT EXTRACTION Bilateral    Dr Katy Fitch   COLONOSCOPY  04/15/2011   ELBOW SURGERY     right elbow   LIPOMA EXCISION Left [id number]   posterior left axilla   TOTAL ABDOMINAL  HYSTERECTOMY  03/05/1992   leiomyoma and cellular leiomyoma   Family History  Problem Relation Age of Onset   Diabetes Mother    Stroke Mother    Hypertension Mother    Stroke Father    Diabetes Maternal Aunt    Heart disease Maternal Aunt         (had pacemaker)   Diabetes Maternal Grandmother    Stroke Maternal Grandmother    Hypertension Maternal Grandmother    Colon cancer Neg Hx    Social History   Socioeconomic History   Marital status: Single    Spouse name: Not on file   Number of children: 0   Years of education: Not on file   Highest education level: Not on file  Occupational History   Occupation: Disabled (from arm fracture)  Tobacco Use   Smoking status: Never   Smokeless tobacco: Never  Vaping  Use   Vaping Use: Never used  Substance and Sexual Activity   Alcohol use: No    Alcohol/week: 0.0 standard drinks   Drug use: No   Sexual activity: Never    Birth control/protection: Surgical    Comment: Hyst--TAH--unsure if has ovaries  Other Topics Concern   Not on file  Social History Narrative         Currently-disability for arm fracture      Mental Retardation, but is able to drive to places she is comfortable with and prepare her own meals and manage most of her own affairs.      [name] (neighbor) is POA/HCPOA      Reports she gets regular exercise and tries to eat healthy   Social Determinants of Health   Financial Resource Strain: Not on file  Food Insecurity: Not on file  Transportation Needs: Not on file  Physical Activity: Not on file  Stress: Not on file  Social Connections: Not on file    Tobacco Counseling Counseling given: Not Answered   Clinical Intake:                 Diabetic?yes Nutrition Risk Assessment:  Has the patient had any N/V/D within the last 2 months?  No  Does the patient have any non-healing wounds?  No  Has the patient had any unintentional weight loss or weight gain?  No   Diabetes:  Is the  patient diabetic?  Yes  If diabetic, was a CBG obtained today?  No  Did the patient bring in their glucometer from home?  No  How often do you monitor your CBG's? Patient does not have glucometer .   Financial Strains and Diabetes Management:  Are you having any financial strains with the device, your supplies or your medication? No .  Does the patient want to be seen by Chronic Care Management for management of their diabetes?  No  Would the patient like to be referred to a Nutritionist or for Diabetic Management?  No   Diabetic Exams:  Diabetic Eye Exam: Completed 08/28/2020 Diabetic Foot Exam: Overdue, Pt has been advised about the importance in completing this exam. Pt is scheduled for diabetic foot exam on next office visit .          Activities of Daily Living No flowsheet data found.  Patient Care Team: [name], MD as PCP - General (Family Medicine)  Indicate any recent Medical Services you may have received from other than [facility] providers in the past year (date may be approximate).     Assessment:   This is a routine wellness examination for Natalina.  Hearing/Vision screen No results found.  Dietary issues and exercise activities discussed:     Goals Addressed   None    Depression Screen PHQ 2/9 Scores 09/07/2019 12/22/2017 12/19/2016 03/21/2016 02/21/2016 01/30/2016 01/28/2016  PHQ - 2 Score 0 0 0 0 0 0 0  PHQ- 9 Score 0 - - - - - -    Fall Risk Fall Risk  09/07/2019 12/22/2017 12/19/2016 12/19/2016 03/21/2016  Falls in the past year? 0 0 - Yes Yes  Comment - - - - Falls: (2)  12/11/15 and 02/12/16  Number falls in past yr: 0 - - 2 or more 2 or more  Injury with Fall? 0 - - - Yes  Risk Factor Category  - - - - High Fall Risk  Risk for fall due to : Impaired balance/gait - Impaired balance/gait -  History of fall(s);Impaired balance/gait  Follow up Falls evaluation completed;Falls prevention discussed - Education provided Education provided Falls evaluation  completed;Education provided;Falls prevention discussed  Comment - - - sprained ankle  -    FALL RISK PREVENTION PERTAINING TO THE HOME:  Any stairs in or around the home? No  If so, are there any without handrails? No  Home free of loose throw rugs in walkways, pet beds, electrical cords, etc? Yes  Adequate lighting in your home to reduce risk of falls? Yes   ASSISTIVE DEVICES UTILIZED TO PREVENT FALLS:  Life alert? No  Use of a cane, walker or w/c? Yes  Grab bars in the bathroom? Yes  Shower chair or bench in shower? Yes  Elevated toilet seat or a handicapped toilet? Yes    Cognitive Function: Normal cognitive status assessed by direct observation by this Nurse Health Advisor. No abnormalities found.   MMSE - Mini Mental State Exam 12/22/2017 12/19/2016 12/11/2015  Not completed: (No Data) (No Data) (No Data)     6CIT Screen 09/07/2019  What Year? 0 points  What month? 0 points  What time? 0 points  Count back from 20 2 points  Months in reverse 0 points  Repeat phrase 2 points  Total Score 4    Immunizations Immunization History  Administered Date(s) Administered   Influenza Split 09/19/2010   Influenza Whole 11/12/2004, 10/21/2006, 10/18/2007, 10/19/2008, 10/25/2009   Influenza,inj,Quad PF,6+ Mos 09/17/2012, 09/19/2013, 09/13/2014, 09/06/2016, 09/14/2017, 09/17/2018   Influenza-Unspecified 08/31/2015, 09/06/2016, 09/14/2017   PFIZER(Purple Top)SARS-COV-2 Vaccination 02/21/2019, 03/22/2019   Pneumococcal Conjugate-13 09/14/2019   Pneumococcal Polysaccharide-23 03/10/2002, 02/08/2009   Td 03/10/2002, 08/08/2014   Zoster Recombinat (Shingrix) 09/22/2017, 12/21/2017   Zoster, Live 08/08/2014    TDAP status: Up to date  Flu Vaccine status: Up to date  Pneumococcal vaccine status: Up to date  Covid-19 vaccine status: Completed vaccines  Qualifies for Shingles Vaccine? Yes   Zostavax completed Yes   Shingrix Completed?: Yes  Screening Tests Health  Maintenance  Topic Date Due   COLONOSCOPY (Pts 45-79yr Insurance coverage will need to be confirmed)  04/18/2018   COVID-19 Vaccine (3 - Pfizer risk series) 04/19/2019   DEXA SCAN  Never done   MAMMOGRAM  10/20/2019   INFLUENZA VACCINE  08/20/2020   OPHTHALMOLOGY EXAM  08/23/2020   URINE MICROALBUMIN  09/13/2020   FOOT EXAM  09/13/2020   PNA vac Low Risk Adult (2 of 2 - PPSV23) 09/13/2020   HEMOGLOBIN A1C  12/13/2020   TETANUS/TDAP  08/07/2024   Hepatitis C Screening  Completed   Zoster Vaccines- Shingrix  Completed   HPV VACCINES  Aged Out    Health Maintenance  Health Maintenance Due  Topic Date Due   COLONOSCOPY (Pts 45-447yrInsurance coverage will need to be confirmed)  04/18/2018   COVID-19 Vaccine (3 - Pfizer risk series) 04/19/2019   DEXA SCAN  Never done   MAMMOGRAM  10/20/2019   INFLUENZA VACCINE  08/20/2020   OPHTHALMOLOGY EXAM  08/23/2020   URINE MICROALBUMIN  09/13/2020    Colorectal cancer screening: Type of screening: Colonoscopy. Completed patient declines . Repeat every pt declines  years  Mammogram status: Ordered 09/07/2020. Pt provided with contact info and advised to call to schedule appt.   Bone Density status: Completed patient declines . Results reflect: Bone density results: OSTEOPENIA. Repeat every pt declines  years.  Lung Cancer Screening: (Low Dose CT Chest recommended if Age 66-80ears, 30 pack-year currently smoking OR have quit w/in 15years.) does  not qualify.   Lung Cancer Screening Referral: n/a  Additional Screening:  Hepatitis C Screening: does not qualify;   Vision Screening: Recommended annual ophthalmology exams for early detection of glaucoma and other disorders of the eye. Is the patient up to date with their annual eye exam?  Yes  Who is the provider or what is the name of the office in which the patient attends annual eye exams? Dr.Groat  If pt is not established with a provider, would they like to be referred to a provider  to establish care? No .   Dental Screening: Recommended annual dental exams for proper oral hygiene  Community Resource Referral / Chronic Care Management: CRR required this visit?  No   CCM required this visit?  No      Plan:     I have personally reviewed and noted the following in the patient's chart:   Medical and social history Use of alcohol, tobacco or illicit drugs  Current medications and supplements including opioid prescriptions.  Functional ability and status Nutritional status Physical activity Advanced directives List of other physicians Hospitalizations, surgeries, and ER visits in previous 12 months Vitals Screenings to include cognitive, depression, and falls Referrals and appointments  In addition, I have reviewed and discussed with patient certain preventive protocols, quality metrics, and best practice recommendations. A written personalized care plan for preventive services as well as general preventive health recommendations were provided to patient.     Randel Pigg, LPN   [id number]   Nurse Notes: Patient does not have glucometer/ supplies to regularly monitor blood sugars.  Note sent to PCP Dr. Volanda Napoleon

## 2020-09-17 ENCOUNTER — Encounter: Payer: Self-pay | Admitting: Family Medicine

## 2020-09-27 ENCOUNTER — Encounter: Payer: Medicare Other | Admitting: Family Medicine

## 2020-10-04 ENCOUNTER — Encounter (INDEPENDENT_AMBULATORY_CARE_PROVIDER_SITE_OTHER): Payer: Medicare Other | Admitting: Ophthalmology

## 2020-10-04 ENCOUNTER — Other Ambulatory Visit: Payer: Self-pay

## 2020-10-04 DIAGNOSIS — H43813 Vitreous degeneration, bilateral: Secondary | ICD-10-CM | POA: Diagnosis not present

## 2020-10-04 DIAGNOSIS — I10 Essential (primary) hypertension: Secondary | ICD-10-CM | POA: Diagnosis not present

## 2020-10-04 DIAGNOSIS — H33302 Unspecified retinal break, left eye: Secondary | ICD-10-CM

## 2020-10-04 DIAGNOSIS — H34832 Tributary (branch) retinal vein occlusion, left eye, with macular edema: Secondary | ICD-10-CM | POA: Diagnosis not present

## 2020-10-04 DIAGNOSIS — H35033 Hypertensive retinopathy, bilateral: Secondary | ICD-10-CM

## 2020-10-05 ENCOUNTER — Encounter: Payer: Self-pay | Admitting: Family Medicine

## 2020-10-05 ENCOUNTER — Ambulatory Visit (INDEPENDENT_AMBULATORY_CARE_PROVIDER_SITE_OTHER): Payer: Medicare Other | Admitting: Family Medicine

## 2020-10-05 VITALS — BP 124/82 | HR 79 | Temp 98.4°F | Wt 161.4 lb

## 2020-10-05 DIAGNOSIS — K5900 Constipation, unspecified: Secondary | ICD-10-CM | POA: Diagnosis not present

## 2020-10-05 DIAGNOSIS — Z23 Encounter for immunization: Secondary | ICD-10-CM | POA: Diagnosis not present

## 2020-10-05 DIAGNOSIS — E119 Type 2 diabetes mellitus without complications: Secondary | ICD-10-CM

## 2020-10-05 DIAGNOSIS — J309 Allergic rhinitis, unspecified: Secondary | ICD-10-CM | POA: Diagnosis not present

## 2020-10-05 DIAGNOSIS — I10 Essential (primary) hypertension: Secondary | ICD-10-CM

## 2020-10-05 LAB — CBC WITH DIFFERENTIAL/PLATELET
Basophils Absolute: 0 10*3/uL (ref 0.0–0.1)
Basophils Relative: 0.5 % (ref 0.0–3.0)
Eosinophils Absolute: 0.1 10*3/uL (ref 0.0–0.7)
Eosinophils Relative: 1.5 % (ref 0.0–5.0)
HCT: 47.4 % — ABNORMAL HIGH (ref 36.0–46.0)
Hemoglobin: 15.4 g/dL — ABNORMAL HIGH (ref 12.0–15.0)
Lymphocytes Relative: 15.8 % (ref 12.0–46.0)
Lymphs Abs: 1.3 10*3/uL (ref 0.7–4.0)
MCHC: 32.5 g/dL (ref 30.0–36.0)
MCV: 91.9 fl (ref 78.0–100.0)
Monocytes Absolute: 0.8 10*3/uL (ref 0.1–1.0)
Monocytes Relative: 9.6 % (ref 3.0–12.0)
Neutro Abs: 6.1 10*3/uL (ref 1.4–7.7)
Neutrophils Relative %: 72.6 % (ref 43.0–77.0)
Platelets: 308 10*3/uL (ref 150.0–400.0)
RBC: 5.16 Mil/uL — ABNORMAL HIGH (ref 3.87–5.11)
RDW: 13.9 % (ref 11.5–15.5)
WBC: 8.4 10*3/uL (ref 4.0–10.5)

## 2020-10-05 LAB — COMPREHENSIVE METABOLIC PANEL
ALT: 20 U/L (ref 0–35)
AST: 15 U/L (ref 0–37)
Albumin: 4 g/dL (ref 3.5–5.2)
Alkaline Phosphatase: 78 U/L (ref 39–117)
BUN: 13 mg/dL (ref 6–23)
CO2: 28 mEq/L (ref 19–32)
Calcium: 9.2 mg/dL (ref 8.4–10.5)
Chloride: 105 mEq/L (ref 96–112)
Creatinine, Ser: 0.58 mg/dL (ref 0.40–1.20)
GFR: 94.46 mL/min (ref >60.00)
Glucose, Bld: 83 mg/dL (ref 70–99)
Potassium: 3.6 mEq/L (ref 3.5–5.1)
Sodium: 144 mEq/L (ref 135–145)
Total Bilirubin: 0.3 mg/dL (ref 0.2–1.2)
Total Protein: 6.7 g/dL (ref 6.0–8.3)

## 2020-10-05 LAB — LIPID PANEL
Cholesterol: 138 mg/dL (ref 0–200)
HDL: 35.8 mg/dL — ABNORMAL LOW (ref >39.00)
LDL Cholesterol: 75 mg/dL (ref 0–99)
NonHDL: 102.28
Total CHOL/HDL Ratio: 4
Triglycerides: 138 mg/dL (ref 0.0–149.0)
VLDL: 27.6 mg/dL (ref 0.0–40.0)

## 2020-10-05 LAB — HEMOGLOBIN A1C: Hgb A1c MFr Bld: 6.1 % (ref 4.6–6.5)

## 2020-10-05 LAB — TSH: TSH: 1.9 u[IU]/mL (ref 0.35–5.50)

## 2020-10-05 MED ORDER — KETOCONAZOLE 2 % EX SHAM: MEDICATED_SHAMPOO | CUTANEOUS | 2 refills | Status: DC

## 2020-10-05 MED ORDER — IPRATROPIUM BROMIDE 0.06 % NA SOLN: NASAL | 2 refills | Status: AC

## 2020-10-05 MED ORDER — DILTIAZEM HCL ER COATED BEADS 240 MG PO CP24: 240.0000 mg | ORAL_CAPSULE | Freq: Every day | ORAL | 3 refills | Status: DC

## 2020-10-05 NOTE — Progress Notes (Signed)
Subjective:    Patient ID: Linda Thompson, female    DOB: [birth year], 65 y.o.   MRN: [id number]  Chief Complaint  Patient presents with   stool    Bowels moved Monday and a little on Tuesday while drinking metamucil were black. Last night bowels moved and this morning brown.  Pt has a legal guardian.  HPI Patient was seen today for acute concern.  Pt with intermittent constipation over the last few wks. Recently seen by GI, had polyps in stomach and duodenum.  Advised ok to take Metamucil.  After taking metamucil, pt had several dark "black" stools.  Pt stopped taking Metamucil on Wed, having normal appearing stools ever since.  Drinking water daily.  Denies dizziness, HAs, fatigue, n/v, abd pain or bloating.  Interested in influenza vaccine.  Past Medical History:  Diagnosis Date   Allergic rhinitis    Allergy    Anemia    Chronic low back pain    DDD (degenerative disc disease), lumbar    GERD (gastroesophageal reflux disease)    w/ hx diaphragmatic hernia   Glaucoma    GLAUCOMA NOS 03/11/2006   Qualifier: Diagnosis of  By: Diona Browner MD, Amy     Heart murmur    Hx of fracture    R arm, foot   Hyperlipemia    Hypertension    IBS (irritable bowel syndrome)    Low back pain 11/22/2012   Meningioma 12/20/2001   Repeat MR from 2017 - radiology feels not a meningioma, but rather "Thickening of the anterior falx appears to be related to degenerative ossification and not meningioma." HCPOA opted not to do repeat imaging with CT for the possible hemangioma and declined further evaluation of this.   Meningioma ([place])    ant falx, stable on prior imaging   Moderate intellectual disabilities    Murmur    Obesity    Osteoarthrosis, unspecified whether generalized or localized, ankle and foot    Sarcoidosis    difuse bony lesions and LDA, dx by biopsy in 2016   Transaminitis    Type II or unspecified type diabetes mellitus without mention of complication, not stated as uncontrolled      Allergies  Allergen Reactions   Ace Inhibitors Cough   Amoxicillin-Pot Clavulanate Diarrhea   Augmentin [Amoxicillin-Pot Clavulanate] Other (See Comments)    diarrhea   Azithromycin Diarrhea   Ceftin [Cefuroxime Axetil]     diarrhea   Cefuroxime Diarrhea and Other (See Comments)    Generalized weakness   Cyclobenzaprine Hcl Diarrhea   Flexeril [Cyclobenzaprine] Diarrhea   Flonase [Fluticasone Propionate] Other (See Comments)    Nose bleed   Ibuprofen Nausea And Vomiting    ROS General: Denies fever, chills, night sweats, changes in weight, changes in appetite HEENT: Denies headaches, ear pain, changes in vision, rhinorrhea, sore throat CV: Denies CP, palpitations, SOB, orthopnea Pulm: Denies SOB, cough, wheezing GI: Denies abdominal pain, nausea, vomiting, diarrhea, constipation GU: Denies dysuria, hematuria, frequency, vaginal discharge Msk: Denies muscle cramps, joint pains Neuro: Denies weakness, numbness, tingling Skin: Denies rashes, bruising Psych: Denies depression, anxiety, hallucinations     Objective:    Blood pressure 124/82, pulse 79, temperature 98.4 F (36.9 C), temperature source Oral, weight 161 lb 6.4 oz (73.2 kg), last menstrual period 01/21/1992, SpO2 97 %.   Gen. Pleasant, well-nourished, in no distress, normal affect   HEENT: [place]/AT, face symmetric, conjunctiva clear, no scleral icterus, PERRLA, EOMI, nares patent without drainage Lungs: no accessory muscle use, CTAB,  no wheezes or rales Cardiovascular: RRR, no m/r/g, no peripheral edema GU: External female genitalia.  No rashes present.  Normal rectal tone.  Small amount of soft-med consistency stool in rectal vault.  No hemorrhoids.  Stool brown in color, Guaiac negative. Musculoskeletal: No deformities, no cyanosis or clubbing, normal tone Neuro:  A&Ox3, CN II-XII intact, normal gait Skin:  Warm, no lesions/ rash   Wt Readings from Last 3 Encounters:  10/05/20 161 lb 6.4 oz (73.2 kg)   06/12/20 159 lb 14.4 oz (72.5 kg)  09/14/19 166 lb (75.3 kg)    Lab Results  Component Value Date   WBC 9.6 11/10/2018   HGB 15.8 (H) 11/10/2018   HCT 48.8 (H) 11/10/2018   PLT 348.0 11/10/2018   GLUCOSE 103 (H) 11/10/2018   CHOL 180 11/10/2018   TRIG 190.0 (H) 11/10/2018   HDL 37.80 (L) 11/10/2018   LDLDIRECT 95.0 05/19/2016   LDLCALC 104 (H) 11/10/2018   ALT 26 11/10/2018   AST 20 11/10/2018   NA 144 11/10/2018   K 4.0 11/10/2018   CL 104 11/10/2018   CREATININE 0.58 11/10/2018   BUN 11 11/10/2018   CO2 30 11/10/2018   TSH 2.09 11/10/2018   INR 0.99 10/03/2014   HGBA1C 5.7 (A) 06/12/2020   MICROALBUR 2.4 09/14/2019    Assessment/Plan:  Constipation, unspecified constipation type  -increase po intake of water -would continue to hold metamucil given dark appearance of stools while taking. -Diet oral diet -consider miralax prn -continue f/u with [facility], Dr. Arsenio Loader -strict precautions as concerned for GIB given h/o multiple gastric polyps - Plan: CBC with Differential/Platelet, CMP, TSH  Need for influenza vaccination  - Plan: Flu Vaccine QUAD High Dose(Fluad)  Type 2 diabetes mellitus without complication, without long-term current use of insulin ([place])  -controlled -Hgb A1C 5.7% on 06/12/20 -continue metformin 1000 mg BID -on statin - Plan: Lipid panel, Hemoglobin A1c  Essential hypertension  -controlled -continue cardizem CD 240 mg, HCTZ 12.5 mg,  - Plan: Lipid panel, CMP, diltiazem (CARDIZEM CD) 240 MG 24 hr capsule  Refills remaining on several requested meds.  Pt advised to contact pharmacy.  F/u prn  Grier Mitts, MD

## 2020-10-10 ENCOUNTER — Telehealth: Payer: Self-pay | Admitting: Family Medicine

## 2020-10-11 NOTE — Telephone Encounter (Signed)
PT called wanting Dr.Banks or the nurse to give her a callback as she is wanting to do her mammogram and bone density test on the same and wants to make sure this can happen. She had already been contacted by the mammogram facility.

## 2020-10-11 NOTE — Telephone Encounter (Signed)
Please do not call patient on Tuesday and Wednesday next week she will be busy and away from the phone

## 2020-10-12 NOTE — Telephone Encounter (Signed)
PT called to see if the Bone Density test has been order yet and if so can she schedule it. Did not see any orders in the system please advise as she has her mammogram schedule for the 10/06 at 1pm  and would like to have it around the same time. Please advise.

## 2020-10-12 NOTE — Telephone Encounter (Signed)
Spoke with pt, advised her an order has to be placed and we cannot guarantee she will be able to have the mammogram and dexa scan done on the same day or at the same place.

## 2020-10-22 ENCOUNTER — Telehealth: Payer: Self-pay | Admitting: *Deleted

## 2020-10-22 NOTE — Chronic Care Management (AMB) (Signed)
  Chronic Care Management   Note  10/22/2020 Name: SHAYANNE GOMM MRN: [id number] DOB: 06-20-1955  JALEENA VIVIANI is a 65 y.o. year old female who is a primary care patient of [name], MD. I reached out to Tyrell Antonio by phone today in response to a referral sent by Ms. Dwana Curd Louks's PCP.  Ms. Friedlander was given information about Chronic Care Management services today including:  CCM service includes personalized support from designated clinical staff supervised by her physician, including individualized plan of care and coordination with other care providers 24/7 contact phone numbers for assistance for urgent and routine care needs. Service will only be billed when office clinical staff spend 20 minutes or more in a month to coordinate care. Only one practitioner may furnish and bill the service in a calendar month. The patient may stop CCM services at any time (effective at the end of the month) by phone call to the office staff. The patient is responsible for co-pay (up to 20% after annual deductible is met) if co-pay is required by the individual health plan.  Patient agreed to services and verbal consent obtained.  Patient agreed to services and verbal consent obtained.   Follow up plan: Telephone appointment with care management team member scheduled for:10/26/20  [facility]  Direct Dial: [phone]

## 2020-10-23 ENCOUNTER — Other Ambulatory Visit: Payer: Self-pay | Admitting: Family Medicine

## 2020-10-23 DIAGNOSIS — I10 Essential (primary) hypertension: Secondary | ICD-10-CM

## 2020-10-25 DIAGNOSIS — Z1231 Encounter for screening mammogram for malignant neoplasm of breast: Secondary | ICD-10-CM | POA: Diagnosis not present

## 2020-10-25 DIAGNOSIS — Z780 Asymptomatic menopausal state: Secondary | ICD-10-CM | POA: Diagnosis not present

## 2020-10-25 DIAGNOSIS — M810 Age-related osteoporosis without current pathological fracture: Secondary | ICD-10-CM | POA: Diagnosis not present

## 2020-10-25 LAB — HM MAMMOGRAPHY

## 2020-10-25 LAB — HM DEXA SCAN

## 2020-10-26 ENCOUNTER — Encounter: Payer: Self-pay | Admitting: Family Medicine

## 2020-10-26 ENCOUNTER — Ambulatory Visit (INDEPENDENT_AMBULATORY_CARE_PROVIDER_SITE_OTHER): Payer: Medicare Other

## 2020-10-26 DIAGNOSIS — F71 Moderate intellectual disabilities: Secondary | ICD-10-CM

## 2020-10-26 DIAGNOSIS — E1169 Type 2 diabetes mellitus with other specified complication: Secondary | ICD-10-CM

## 2020-10-26 DIAGNOSIS — K5900 Constipation, unspecified: Secondary | ICD-10-CM

## 2020-10-26 DIAGNOSIS — K589 Irritable bowel syndrome without diarrhea: Secondary | ICD-10-CM

## 2020-10-26 DIAGNOSIS — E119 Type 2 diabetes mellitus without complications: Secondary | ICD-10-CM

## 2020-10-26 DIAGNOSIS — I10 Essential (primary) hypertension: Secondary | ICD-10-CM

## 2020-10-26 NOTE — Chronic Care Management (AMB) (Signed)
Chronic Care Management   CCM RN Visit Note  10/26/2020 Name: Linda Thompson MRN: [id number] DOB: 01/15/1956  Subjective: Linda Thompson is a 65 y.o. year old female who is a primary care patient of [name], MD. The care management team was consulted for assistance with disease management and care coordination needs.    Engaged with patient by telephone for initial visit in response to provider referral for case management and/or care coordination services.   Consent to Services:  The patient was given the following information about Chronic Care Management services today, agreed to services, and gave verbal consent: 1. CCM service includes personalized support from designated clinical staff supervised by the primary care provider, including individualized plan of care and coordination with other care providers 2. 24/7 contact phone numbers for assistance for urgent and routine care needs. 3. Service will only be billed when office clinical staff spend 20 minutes or more in a month to coordinate care. 4. Only one practitioner may furnish and bill the service in a calendar month. 5.The patient may stop CCM services at any time (effective at the end of the month) by phone call to the office staff. 6. The patient will be responsible for cost sharing (co-pay) of up to 20% of the service fee (after annual deductible is met). Patient agreed to services and consent obtained.  Patient agreed to services and verbal consent obtained.   Assessment: Review of patient past medical history, allergies, medications, health status, including review of consultants reports, laboratory and other test data, was performed as part of comprehensive evaluation and provision of chronic care management services.   SDOH (Social Determinants of Health) assessments and interventions performed:  SDOH Interventions    Flowsheet Row Most Recent Value  SDOH Interventions   Food Insecurity Interventions Intervention  Not Indicated  Housing Interventions Intervention Not Indicated  Stress Interventions Intervention Not Indicated  Transportation Interventions Intervention Not Indicated        CCM Care Plan  Allergies  Allergen Reactions   Ace Inhibitors Cough   Amoxicillin-Pot Clavulanate Diarrhea   Augmentin [Amoxicillin-Pot Clavulanate] Other (See Comments)    diarrhea   Azithromycin Diarrhea   Ceftin [Cefuroxime Axetil]     diarrhea   Cefuroxime Diarrhea and Other (See Comments)    Generalized weakness   Cyclobenzaprine Hcl Diarrhea   Flexeril [Cyclobenzaprine] Diarrhea   Flonase [Fluticasone Propionate] Other (See Comments)    Nose bleed   Ibuprofen Nausea And Vomiting    Outpatient Encounter Medications as of 10/26/2020  Medication Sig   acetaminophen (TYLENOL) 500 MG tablet Take 500 mg by mouth every 6 (six) hours as needed for mild pain.    B-12 MICROLOZENGE 500 MCG SUBL Place under the tongue.   Besifloxacin HCl (BESIVANCE) 0.6 % SUSP Apply to eye. 4 times each day for 2 days   [facility] B-12 500 MCG tablet TAKE 1 TABLET BY MOUTH DAILY   diclofenac sodium (VOLTAREN) 1 % GEL APPLY 2 GRAMS TO AFFECTED AREA 4 TIMES A DAY (Patient taking differently: Apply 2 g topically 4 (four) times daily.)   diltiazem (CARDIZEM CD) 240 MG 24 hr capsule Take 1 capsule (240 mg total) by mouth daily.   dorzolamide-timolol (COSOPT) 22.3-6.8 MG/ML ophthalmic solution SMARTSIG:In Eye(s)   ferrous sulfate 325 (65 FE) MG tablet Take 325 mg by mouth 3 (three) times daily with meals.    fluticasone (FLONASE) 50 MCG/ACT nasal spray IINSTILL 2 SPRAYS INTO EACH NOSTRIL AT BEDTIME   hydrochlorothiazide (MICROZIDE)  12.5 MG capsule TAKE 1 CAPSULE BY MOUTH EVERY DAY   hydrocortisone 2.5 % ointment APPLY TO AFFECTED AREA TWICE DAILY MONDAY THRU FRIDAY ONLY   ipratropium (ATROVENT) 0.06 % nasal spray INHALE 1 SPRAY INTO BOTH NOSTRILS 4 TIMES DAILY   ketoconazole (NIZORAL) 2 % cream SMARTSIG:1 Application Topical 1 to 2 Times  Daily   ketoconazole (NIZORAL) 2 % shampoo USE AS DIRECTED TWICE A WEEK AS DIRECTED   latanoprost (XALATAN) 0.005 % ophthalmic solution Place 1 drop into both eyes at bedtime.    loratadine (CLARITIN) 10 MG tablet Take 1 tablet (10 mg total) by mouth daily.   lovastatin (MEVACOR) 10 MG tablet TAKE 2 TABLETS BY MOUTH AT BEDTIME   metFORMIN (GLUCOPHAGE) 1000 MG tablet TAKE 1 TABLET TWICE A DAY WITH A MEAL   mometasone (ELOCON) 0.1 % ointment APPLY A SMALL AMOUNT TO AFFECTED AREA AS DIRECTED   omeprazole (PRILOSEC) 20 MG capsule TAKE 1 CAPSULE BY MOUTH TWICE A DAY   Vitamin D, Cholecalciferol, 25 MCG (1000 UT) CAPS Take 1,000 Units by mouth daily.   Vitamins-Lipotropics ([facility] INNER EAR PLUS PO) Take 2 tablets by mouth 2 (two) times daily.    [DISCONTINUED] diltiazem (TIAZAC) 240 MG 24 hr capsule Take 1 capsule (240 mg total) by mouth daily.   No facility-administered encounter medications on file as of 10/26/2020.    Patient Active Problem List   Diagnosis Date Noted   Pain due to onychomycosis of toenails of both feet 07/07/2018   Diabetes mellitus without complication ([place]) 02/77/4128   Murmur 02/17/2018   Hypertension associated with diabetes ([place]) 04/20/2017   Hyperlipidemia associated with type 2 diabetes mellitus ([place]) 04/20/2017   Arthritis 12/03/2015   Multiple gastric polyps 08/30/2015   Sarcoidosis 10/27/2014   Morbid obesity ([place]) 05/05/2011   Irritable bowel syndrome 03/05/2010   Diabetes ([place]) 08/13/2007   Allergic rhinitis 09/23/2006   Moderate intellectual disabilities 03/11/2006   Unspecified glaucoma 03/11/2006   Gastroesophageal reflux disease 03/11/2006    Conditions to be addressed/monitored:HTN, HLD, DMII, and irritable bowel syndrome, constipation  moderate intellectual disabilities  Care Plan : Irritable Bowel Syndrome/Constipation  Updates made by Dimitri Ped, RN since 10/26/2020 12:00 AM     Problem: Symptoms (Irritable Bowel Syndrome/Constipation)    Priority: High     Long-Range Goal: Symptoms Managed   Start Date: 10/26/2020  Expected End Date: 03/20/2021  This Visit's Progress: On track  Priority: High  Note:   Current Barriers:  Ineffective Self Health Maintenance in a patient with  Irritable Bowel syndrome/Constipation  with hx of moderate intellectual disabilities  Unable to independently self manage Irritable Bowel syndrome/Constipation Unable to perform IADLs independently Pt has [place] with Desiree Hane and [name] [phone] Spoke with pt and [place].  States that pt has most issues with her Irritable Bowel syndrome/Constipation.  States she does take sugar free Metamucil daily.  States she is trying to eat more vegetables and fruit now.  [place] expressed concern that pt's trust will run out in a few years and is concerned ablout how she will pay for her care.  Clinical Goal(s):  Collaboration with [name], MD regarding development and update of comprehensive plan of care as evidenced by provider attestation and co-signature Inter-disciplinary care team collaboration (see longitudinal plan of care) patient will work with care management team to address care coordination and chronic disease management needs related to Disease Management Educational Needs Care Coordination   Interventions:  Evaluation  of current treatment plan related to  self management of Irritable Bowel syndrome/Constipation , ADL IADL limitations and concerns that trust will run out   self-management and patient's adherence to plan as established by provider. Collaboration with [name], MD regarding development and update of comprehensive plan of care as evidenced by provider attestation       and co-signature Inter-disciplinary care team collaboration (see longitudinal plan of care) Discussed plans with patient for ongoing care management follow up and provided patient with direct contact  information for care management team Reviewed to drink adequate amounts of fluids Reviewd to eat more foods with fiber to help with contipation Referral made to LCSW to discuss with POA pt's situation and her options in the future Self Care Activities:  Patient verbalizes understanding of plan to self manage Irritable Bowel syndrome/Constipation Self administers medications as prescribed Attends all scheduled provider appointments Calls pharmacy for medication refills Calls provider office for new concerns or questions Patient Goals: - avoid caffeine - avoid foods that make my symptoms worse - drink at least 6 glasses of fluids a day - eat meals at regular times - limit fizzy (carbonated) drinks - limit fresh fruit to 3 servings per day - read food label for sorbitol and avoid these foods Follow Up Plan: Telephone follow up appointment with care management team member scheduled for: 11/30/20 at 11:30 AM The patient has been provided with contact information for the care management team and has been advised to call with any health related questions or concerns.      Care Plan : Diabetes Type 2 (Adult)  Updates made by Dimitri Ped, RN since 10/26/2020 12:00 AM     Problem: Disease Progression (Diabetes, Type 2)   Priority: Medium     Long-Range Goal: Disease Progression Prevented or Minimized   Start Date: 10/26/2020  Expected End Date: 03/20/2021  This Visit's Progress: On track  Priority: Medium  Note:   Objective:  Lab Results  Component Value Date   HGBA1C 6.1 10/05/2020   Lab Results  Component Value Date   CREATININE 0.58 10/05/2020   CREATININE 0.58 11/10/2018   CREATININE 0.74 03/16/2018   No results found for: EGFR Current Barriers:  Knowledge Deficits related to basic Diabetes pathophysiology and self care/management Knowledge Deficits related to medications used for management of diabetes Unable to independently self manage Type 2 diabetes with hx of HTN  and HLD Unable to perform IADLs independently Spoke with pt and [place].  POA states that she helps pt make her grocery list and pt is now eating more vegetables and fruit.  States pt does not need to check her CBG or B/P since she is well controlled and thinks it would be too complicated for her to do. Case Manager Clinical Goal(s):  patient will demonstrate improved adherence to prescribed treatment plan for diabetes self care/management as evidenced by: adherence to ADA/ carb modified diet exercise 3 days/week adherence to prescribed medication regimen contacting provider for new or worsened symptoms or questions Interventions:  Collaboration with [name], MD regarding development and update of comprehensive plan of care as evidenced by provider attestation and co-signature Inter-disciplinary care team collaboration (see longitudinal plan of care) Provided education to patient about basic DM disease process Reviewed medications with patient and discussed importance of medication adherence Discussed plans with patient for ongoing care management follow up and provided patient with direct contact information for care management team Provided patient with written educational materials  related to hypo and hyperglycemia and importance of correct treatment Reviewed scheduled/upcoming provider appointments including: Dr. Zigmund Daniel 11/22/20, Podiatry 11/23/20 Referral made to social work team for assistance with POA's concerns on pt's options when her trust runs out Review of patient status, including review of consultants reports, relevant laboratory and other test results, and medications completed. Reviewed to try to increase her activity and to look into going to Senior exercise class in her building Self-Care Activities - Self administers oral medications as prescribed Attends all scheduled provider appointments Adheres to prescribed ADA/carb modified Patient Goals: - change  to whole grain breads, cereal, pasta - drink 6 to 8 glasses of water each day - fill half of plate with vegetables - read food labels for fat, fiber, carbohydrates and portion size - reduce red meat to 2 to 3 times a week - switch to low-fat or skim milk - switch to sugar-free drinks - keep appointment with eye doctor - check feet daily for cuts, sores or redness - keep feet up while sitting - trim toenails straight across - wash and dry feet carefully every day - wear comfortable, cotton socks - wear comfortable, well-fitting shoes Follow Up Plan: Telephone follow up appointment with care management team member scheduled for: 11/30/20 at 11:30 PM The patient has been provided with contact information for the care management team and has been advised to call with any health related questions or concerns.       Plan:Telephone follow up appointment with care management team member scheduled for:  11/11/2 and The patient has been provided with contact information for the care management team and has been advised to call with any health related questions or concerns.  Peter Garter RN, Jackquline Denmark, CDE Care Management Coordinator [facility] [phone], Mobile [phone]

## 2020-10-26 NOTE — Patient Instructions (Addendum)
Visit Information   PATIENT GOALS:   Goals Addressed             This Visit's Progress    RNCM:Eat Healthy       Timeframe:  Long-Range Goal Priority:  Medium Start Date:   11/30/20                          Expected End Date:    03/20/21                   Follow Up Date 11/30/20    - change to whole grain breads, cereal, pasta - drink 6 to 8 glasses of water each day - fill half of plate with vegetables - manage portion size - read food labels for fat, fiber, carbohydrates and portion size - reduce red meat to 2 to 3 times a week - switch to low-fat or skim milk - switch to sugar-free drinks    Why is this important?   When you are ready to manage your nutrition or weight, having a plan and setting goals will help.  Taking small steps to change how you eat and exercise is a good place to start.    Notes:      RNCM:Manage My Cholesterol       Timeframe:  Long-Range Goal Priority:  Medium Start Date:     10/30/20                        Expected End Date:    03/20/21                   Follow Up Date 11/30/20    - change to whole grain breads, cereal, pasta - fill half the plate with nonstarchy vegetables - get blood test (fasting) done 1 week before next visit - increase the amount of fiber in food - read food labels for fat and fiber - switch to low-fat or skim milk    Why is this important?   Changing cholesterol starts with eating heart-healthy foods.  Other steps may be to increase your activity and to quit if you smoke.    Notes:      RNCM:Manage My Diet-Irritable Bowel Syndrome/Constipation       Timeframe:  Long-Range Goal Priority:  High Start Date:   10/26/20                          Expected End Date:   03/20/21                    Follow Up Date 11/30/20    - avoid caffeine - avoid foods that make my symptoms worse - drink at least 6 glasses of fluids a day - eat meals at regular times - limit fizzy (carbonated) drinks - limit fresh fruit to 3  servings per day - read food label for sorbitol and avoid these foods    Why is this important?   Changing what you eat and drink may help you to manage your condition.  It is important to take it slow, making 1 change at a time.  A dietitian is the best person to guide you.    Notes:       Type 2 Diabetes Mellitus, Self-Care, Adult When you have type 2 diabetes (type 2 diabetes mellitus), you must make sure your blood sugar (  glucose) stays in a healthy range. You can do this with: Nutrition. Exercise. Lifestyle changes. Medicines or insulin, if needed. Support from your doctors and others. What are the risks? Having type 2 diabetes can raise your risk for other long-term (chronic) health problems. You may get medicines to help prevent these problems. How to stay aware of your blood sugar  Check your blood sugar level every day, as often as told. Have your A1C (hemoglobin A1C) level checked two or more times a year. Have it checked more often if told. Your doctor will set personal treatment goals for you. In general, you should have these blood sugar levels: Before meals: 80-130 mg/dL (4.4-7.2 mmol/L). After meals: below 180 mg/dL (10 mmol/L). A1C: less than 7%. How to manage high and low blood sugar Symptoms of high blood sugar High blood sugar is also called hyperglycemia. Know the symptoms of high blood sugar. These may include: More thirst. Hunger. Feeling very tired. Needing to pee (urinate) more often than normal. Seeing things blurry. Symptoms of low blood sugar Low blood sugar is also called hypoglycemia. This is when blood sugar is at or below 70 mg/dL (3.9 mmol/L). Symptoms may include: Hunger. Feeling worried or nervous (anxious). Feeling sweaty and cold to the touch (clammy). Being dizzy or light-headed. Feeling sleepy. A fast heartbeat. Feeling grouchy (irritable). Tingling or loss of feeling (numbness) around your mouth, lips, or tongue. Restless  sleep. Diabetes medicines can cause low blood sugar. You are more at risk: While you exercise. After exercise. During sleep. When you are sick. When you skip meals or do not eat for a long time. Treating low blood sugar If you think you have low blood sugar, eat or drink something sugary right away. Keep 15 grams of a fast-acting carb (carbohydrate) with you all the time. Make sure your family and friends know how to treat you if you cannot treat yourself. Treating very low blood sugar Severe hypoglycemia is when your blood sugar is at or below 54 mg/dL (3 mmol/L). Severe hypoglycemia is an emergency. Get medical help right away. Call your local emergency services (911 in the [place] Do not wait to see if the symptoms will go away. Do not drive yourself to the hospital. You may need a glucagon shot if you have very low blood sugar and you cannot eat or drink. Have a family member or friend learn how to check your blood sugar and how to give you a glucagon shot. Ask your doctor if you should have a kit for glucagon shots. Follow these instructions at home: Medicines Take prescribed insulin or diabetes medicines as told by your health care provider. Do not run out of insulin or other medicines. Plan ahead. If you use insulin, change the amount you take based on how active you are and what foods you eat. Your doctor will tell you how to do this. Take over-the-counter and prescription medicines only as told by your doctor. Eating and drinking  Eat healthy foods. These include: Low-fat (lean) proteins. Complex carbs, such as whole grains. Fresh fruits and vegetables. Low-fat dairy products. Healthy fats. Meet with a food expert (dietitian) to make an eating plan. Follow instructions from your doctor about what you cannot eat or drink. Drink enough fluid to keep your pee (urine) pale yellow. Keep track of carbs that you eat. Read food labels and learn serving sizes of foods. Follow your  sick-day plan when you cannot eat or drink as normal. Make this plan with your  doctor so it is ready to use. Activity Exercise as told by your doctor. You may need to: Do stretching and strength exercises two or more times a week. Do 150 minutes or more of exercise each week that makes your heart beat faster and makes you sweat. Spread out your exercise over 3 or more days a week. Do not go more than 2 days in a row without exercise. Talk with your doctor before you start a new exercise. Your doctor may tell you to change: How much insulin or medicines you take. How much food you eat. Lifestyle Do not smoke or use any products that contain nicotine or tobacco. If you need help quitting, ask your doctor. If you drink alcohol and your doctor says that it is safe for you: Limit how much you have to: 0-1 drink a day for women who are not pregnant. 0-2 drinks a day for men. Know how much alcohol is in your drink. In the [place] one drink equals one 12 oz bottle of beer (355 mL), one 5 oz glass of wine (148 mL), or one 1 oz glass of hard liquor (44 mL). Learn to deal with stress. If you need help, ask your doctor. Body care  Stay up to date with your shots (immunizations). Have your eyes and feet checked by a doctor as often as told. Check your skin and feet every day. Check for cuts, bruises, redness, blisters, or sores. Brush your teeth and gums two times a day. Floss one or more times a day. Go to the dentist one or more times every 6 months. Stay at a healthy weight. General instructions Share your diabetes care plan with: Your work or school. People you live with. Carry a card or wear jewelry that says you have diabetes. Keep all follow-up visits. Questions to ask your doctor Do I need to meet with a certified expert in diabetes education and care? Where can I find a support group? Where to find more information For help and guidance and more information about diabetes, please go  to: American Diabetes Association: www.diabetes.org American Association of Diabetes Care and Education Specialists: www.diabeteseducator.org International Diabetes Federation: MemberVerification.ca Summary When you have type 2 diabetes, you must make sure your blood sugar (glucose) stays in a healthy range. You can do this with nutrition, exercise, medicines and insulin, and support from doctors and others. Check your blood sugar every day, or as often as told. Having diabetes can raise your risk for other long-term health problems. You may get medicines to help prevent these problems. Share your diabetes management plan with people at work, school, and home. Keep all follow-up visits. This information is not intended to replace advice given to you by your health care provider. Make sure you discuss any questions you have with your health care provider. Document Revised: 04/02/2020 Document Reviewed: 04/02/2020 Elsevier Patient Education  [place]. Constipation, Adult Constipation is when a person has trouble pooping (having a bowel movement). When you have this condition, you may poop fewer than 3 times a week. Your poop (stool) may also be dry, hard, or bigger than normal. Follow these instructions at home: Eating and drinking  Eat foods that have a lot of fiber, such as: Fresh fruits and vegetables. Whole grains. Beans. Eat less of foods that are low in fiber and high in fat and sugar, such as: [place] fries. Hamburgers. Cookies. Candy. Soda. Drink enough fluid to keep your pee (urine) pale yellow. General instructions Exercise regularly  or as told by your doctor. Try to do 150 minutes of exercise each week. Go to the restroom when you feel like you need to poop. Do not hold it in. Take over-the-counter and prescription medicines only as told by your doctor. These include any fiber supplements. When you poop: Do deep breathing while relaxing your lower belly (abdomen). Relax  your pelvic floor. The pelvic floor is a group of muscles that support the rectum, bladder, and intestines (as well as the uterus in women). Watch your condition for any changes. Tell your doctor if you notice any. Keep all follow-up visits as told by your doctor. This is important. Contact a doctor if: You have pain that gets worse. You have a fever. You have not pooped for 4 days. You vomit. You are not hungry. You lose weight. You are bleeding from the opening of the butt (anus). You have thin, pencil-like poop. Get help right away if: You have a fever, and your symptoms suddenly get worse. You leak poop or have blood in your poop. Your belly feels hard or bigger than normal (bloated). You have very bad belly pain. You feel dizzy or you faint. Summary Constipation is when a person poops fewer than 3 times a week, has trouble pooping, or has poop that is dry, hard, or bigger than normal. Eat foods that have a lot of fiber. Drink enough fluid to keep your pee (urine) pale yellow. Take over-the-counter and prescription medicines only as told by your doctor. These include any fiber supplements. This information is not intended to replace advice given to you by your health care provider. Make sure you discuss any questions you have with your health care provider. Document Revised: 11/24/2018 Document Reviewed: 11/24/2018 Elsevier Patient Education  2022 [employer].   Consent to CCM Services: Linda Thompson was given information about Chronic Care Management services including:  CCM service includes personalized support from designated clinical staff supervised by her physician, including individualized plan of care and coordination with other care providers 24/7 contact phone numbers for assistance for urgent and routine care needs. Service will only be billed when office clinical staff spend 20 minutes or more in a month to coordinate care. Only one practitioner may furnish and bill the  service in a calendar month. The patient may stop CCM services at any time (effective at the end of the month) by phone call to the office staff. The patient will be responsible for cost sharing (co-pay) of up to 20% of the service fee (after annual deductible is met).  Patient agreed to services and verbal consent obtained.   The patient verbalized understanding of instructions, educational materials, and care plan provided today and agreed to receive a mailed copy of patient instructions, educational materials, and care plan.   Telephone follow up appointment with care management team member scheduled for: 11/30/20 at 11:30 AM Linda Garter RN, Linda Thompson, CDE Care Management Coordinator [facility] [phone], Mobile [phone]   CLINICAL CARE PLAN: Patient Care Plan: Irritable Bowel Syndrome/Constipation     Problem Identified: Symptoms (Irritable Bowel Syndrome/Constipation)   Priority: High     Long-Range Goal: Symptoms Managed   Start Date: 10/26/2020  Expected End Date: 03/20/2021  This Visit's Progress: On track  Priority: High  Note:   Current Barriers:  Ineffective Self Health Maintenance in a patient with  Irritable Bowel syndrome/Constipation  with hx of moderate intellectual disabilities  Unable to independently self manage Irritable Bowel syndrome/Constipation Unable to perform IADLs  independently Pt has [place] with Linda Thompson and Linda Thompson [phone] Spoke with pt and Page.  States that pt has most issues with her Irritable Bowel syndrome/Constipation.  States she does take sugar free Metamucil daily.  States she is trying to eat more vegetables and fruit now.  Linda Thompson expressed concern that pt's trust will run out in a few years and is concerned ablout how she will pay for her care.  Clinical Goal(s):  Collaboration with Linda Ruddy, MD regarding development and update of comprehensive plan of  care as evidenced by provider attestation and co-signature Inter-disciplinary care team collaboration (see longitudinal plan of care) patient will work with care management team to address care coordination and chronic disease management needs related to Disease Management Educational Needs Care Coordination   Interventions:  Evaluation of current treatment plan related to  self management of Irritable Bowel syndrome/Constipation , ADL IADL limitations and concerns that trust will run out   self-management and patient's adherence to plan as established by provider. Collaboration with Linda Ruddy, MD regarding development and update of comprehensive plan of care as evidenced by provider attestation       and co-signature Inter-disciplinary care team collaboration (see longitudinal plan of care) Discussed plans with patient for ongoing care management follow up and provided patient with direct contact information for care management team Reviewed to drink adequate amounts of fluids Reviewd to eat more foods with fiber to help with contipation Referral made to LCSW to discuss with POA pt's situation and her options in the future Self Care Activities:  Patient verbalizes understanding of plan to self manage Irritable Bowel syndrome/Constipation Self administers medications as prescribed Attends all scheduled provider appointments Calls pharmacy for medication refills Calls provider office for new concerns or questions Patient Goals: - avoid caffeine - avoid foods that make my symptoms worse - drink at least 6 glasses of fluids a day - eat meals at regular times - limit fizzy (carbonated) drinks - limit fresh fruit to 3 servings per day - read food label for sorbitol and avoid these foods Follow Up Plan: Telephone follow up appointment with care management team member scheduled for: 11/30/20 at 11:30 AM The patient has been provided with contact information for the care management team  and has been advised to call with any health related questions or concerns.      Patient Care Plan: Diabetes Type 2 (Adult)     Problem Identified: Disease Progression (Diabetes, Type 2)   Priority: Medium     Long-Range Goal: Disease Progression Prevented or Minimized   Start Date: 10/26/2020  Expected End Date: 03/20/2021  This Visit's Progress: On track  Priority: Medium  Note:   Objective:  Lab Results  Component Value Date   HGBA1C 6.1 10/05/2020   Lab Results  Component Value Date   CREATININE 0.58 10/05/2020   CREATININE 0.58 11/10/2018   CREATININE 0.74 03/16/2018   No results found for: EGFR Current Barriers:  Knowledge Deficits related to basic Diabetes pathophysiology and self care/management Knowledge Deficits related to medications used for management of diabetes Unable to independently self manage Type 2 diabetes with hx of HTN and HLD Unable to perform IADLs independently Spoke with pt and Linda Thompson.  POA states that she helps pt make her grocery list and pt is now eating more vegetables and fruit.  States pt does not need to check her CBG or B/P since she is well controlled and thinks  it would be too complicated for her to do. Case Manager Clinical Goal(s):  patient will demonstrate improved adherence to prescribed treatment plan for diabetes self care/management as evidenced by: adherence to ADA/ carb modified diet exercise 3 days/week adherence to prescribed medication regimen contacting provider for new or worsened symptoms or questions Interventions:  Collaboration with Linda Ruddy, MD regarding development and update of comprehensive plan of care as evidenced by provider attestation and co-signature Inter-disciplinary care team collaboration (see longitudinal plan of care) Provided education to patient about basic DM disease process Reviewed medications with patient and discussed importance of medication adherence Discussed plans with patient  for ongoing care management follow up and provided patient with direct contact information for care management team Provided patient with written educational materials related to hypo and hyperglycemia and importance of correct treatment Reviewed scheduled/upcoming provider appointments including: Linda Thompson 11/22/20, Podiatry 11/23/20 Referral made to social work team for assistance with POA's concerns on pt's options when her trust runs out Review of patient status, including review of consultants reports, relevant laboratory and other test results, and medications completed. Reviewed to try to increase her activity and to look into going to Senior exercise class in her building Self-Care Activities - Self administers oral medications as prescribed Attends all scheduled provider appointments Adheres to prescribed ADA/carb modified Patient Goals: - change to whole grain breads, cereal, pasta - drink 6 to 8 glasses of water each day - fill half of plate with vegetables - read food labels for fat, fiber, carbohydrates and portion size - reduce red meat to 2 to 3 times a week - switch to low-fat or skim milk - switch to sugar-free drinks - keep appointment with eye doctor - check feet daily for cuts, sores or redness - keep feet up while sitting - trim toenails straight across - wash and dry feet carefully every day - wear comfortable, cotton socks - wear comfortable, well-fitting shoes Follow Up Plan: Telephone follow up appointment with care management team member scheduled for: 11/30/20 at 11:30 PM The patient has been provided with contact information for the care management team and has been advised to call with any health related questions or concerns.

## 2020-10-29 NOTE — Progress Notes (Signed)
Scheduled with SW on 11/06/20 with Marlou Sa at [place] and [name] at [place], [place] Management  Direct Dial: [phone]

## 2020-10-31 ENCOUNTER — Encounter: Payer: Self-pay | Admitting: Family Medicine

## 2020-11-06 ENCOUNTER — Telehealth: Payer: Medicare Other

## 2020-11-08 ENCOUNTER — Ambulatory Visit: Payer: Medicare Other | Admitting: *Deleted

## 2020-11-08 ENCOUNTER — Telehealth: Payer: Medicare Other

## 2020-11-08 DIAGNOSIS — H409 Unspecified glaucoma: Secondary | ICD-10-CM

## 2020-11-08 DIAGNOSIS — I10 Essential (primary) hypertension: Secondary | ICD-10-CM

## 2020-11-08 DIAGNOSIS — E119 Type 2 diabetes mellitus without complications: Secondary | ICD-10-CM

## 2020-11-08 DIAGNOSIS — I152 Hypertension secondary to endocrine disorders: Secondary | ICD-10-CM

## 2020-11-08 DIAGNOSIS — E1169 Type 2 diabetes mellitus with other specified complication: Secondary | ICD-10-CM

## 2020-11-08 DIAGNOSIS — F71 Moderate intellectual disabilities: Secondary | ICD-10-CM

## 2020-11-08 DIAGNOSIS — E1159 Type 2 diabetes mellitus with other circulatory complications: Secondary | ICD-10-CM

## 2020-11-08 NOTE — Patient Instructions (Signed)
Visit Information   PATIENT GOALS:   Goals Addressed             This Visit's Progress    Maintain My Quality of Life.   On track    Timeframe:  Long-Range Goal Priority:  High Start Date:   11/08/2020                          Expected End Date:   02/09/2020                  Follow-Up Date:  01/03/2021 at 9:00am  Patient Goals/Self-Care Activities: Marlou Sa and Laural Benes (Healthcare and [place]) will review list of assisted living facilities e-mailed to them, and agreed to begin touring facilities of interest. Contact LCSW directly (# [phone]) if you have questions, need assistance, or if additional social work needs are identified between now and our next scheduled telephone outreach call.        Consent to CCM Services: Ms. Seals was given information about Chronic Care Management services including:  CCM service includes personalized support from designated clinical staff supervised by her physician, including individualized plan of care and coordination with other care providers 24/7 contact phone numbers for assistance for urgent and routine care needs. Service will only be billed when office clinical staff spend 20 minutes or more in a month to coordinate care. Only one practitioner may furnish and bill the service in a calendar month. The patient may stop CCM services at any time (effective at the end of the month) by phone call to the office staff. The patient will be responsible for cost sharing (co-pay) of up to 20% of the service fee (after annual deductible is met).  Patient agreed to services and verbal consent obtained.   Patient verbalizes understanding of instructions provided today and agrees to view in [place].   Telephone follow up appointment with care management team member scheduled for:  01/03/2021 at [place] LCSW Licensed Clinical Social Worker [facility] [phone]   CLINICAL CARE  PLAN: Patient Care Plan: Irritable Bowel Syndrome/Constipation     Problem Identified: Symptoms (Irritable Bowel Syndrome/Constipation)   Priority: High     Long-Range Goal: Symptoms Managed   Start Date: 10/26/2020  Expected End Date: 03/20/2021  This Visit's Progress: On track  Priority: High  Note:   Current Barriers:  Ineffective Self Health Maintenance in a patient with  Irritable Bowel syndrome/Constipation  with hx of moderate intellectual disabilities  Unable to independently self manage Irritable Bowel syndrome/Constipation Unable to perform IADLs independently Pt has [place] with Desiree Hane and [name] [phone] Spoke with pt and [place].  States that pt has most issues with her Irritable Bowel syndrome/Constipation.  States she does take sugar free Metamucil daily.  States she is trying to eat more vegetables and fruit now.  [place] expressed concern that pt's trust will run out in a few years and is concerned ablout how she will pay for her care.  Clinical Goal(s):  Collaboration with [name], MD regarding development and update of comprehensive plan of care as evidenced by provider attestation and co-signature Inter-disciplinary care team collaboration (see longitudinal plan of care) patient will work with care management team to address care coordination and chronic disease management needs related to Disease Management Educational Needs Care Coordination   Interventions:  Evaluation of current treatment plan related to  self  management of Irritable Bowel syndrome/Constipation , ADL IADL limitations and concerns that trust will run out   self-management and patient's adherence to plan as established by provider. Collaboration with [name], MD regarding development and update of comprehensive plan of care as evidenced by provider attestation       and co-signature Inter-disciplinary care team collaboration (see  longitudinal plan of care) Discussed plans with patient for ongoing care management follow up and provided patient with direct contact information for care management team Reviewed to drink adequate amounts of fluids Reviewd to eat more foods with fiber to help with contipation Referral made to LCSW to discuss with POA pt's situation and her options in the future Self Care Activities:  Patient verbalizes understanding of plan to self manage Irritable Bowel syndrome/Constipation Self administers medications as prescribed Attends all scheduled provider appointments Calls pharmacy for medication refills Calls provider office for new concerns or questions Patient Goals: - avoid caffeine - avoid foods that make my symptoms worse - drink at least 6 glasses of fluids a day - eat meals at regular times - limit fizzy (carbonated) drinks - limit fresh fruit to 3 servings per day - read food label for sorbitol and avoid these foods Follow Up Plan: Telephone follow up appointment with care management team member scheduled for: 11/30/20 at 11:30 AM The patient has been provided with contact information for the care management team and has been advised to call with any health related questions or concerns.      Patient Care Plan: Diabetes Type 2 (Adult)     Problem Identified: Disease Progression (Diabetes, Type 2)   Priority: Medium     Long-Range Goal: Disease Progression Prevented or Minimized   Start Date: 10/26/2020  Expected End Date: 03/20/2021  This Visit's Progress: On track  Priority: Medium  Note:   Objective:  Lab Results  Component Value Date   HGBA1C 6.1 10/05/2020   Lab Results  Component Value Date   CREATININE 0.58 10/05/2020   CREATININE 0.58 11/10/2018   CREATININE 0.74 03/16/2018   No results found for: EGFR Current Barriers:  Knowledge Deficits related to basic Diabetes pathophysiology and self care/management Knowledge Deficits related to medications used for  management of diabetes Unable to independently self manage Type 2 diabetes with hx of HTN and HLD Unable to perform IADLs independently Spoke with pt and [place].  POA states that she helps pt make her grocery list and pt is now eating more vegetables and fruit.  States pt does not need to check her CBG or B/P since she is well controlled and thinks it would be too complicated for her to do. Case Manager Clinical Goal(s):  patient will demonstrate improved adherence to prescribed treatment plan for diabetes self care/management as evidenced by: adherence to ADA/ carb modified diet exercise 3 days/week adherence to prescribed medication regimen contacting provider for new or worsened symptoms or questions Interventions:  Collaboration with [name], MD regarding development and update of comprehensive plan of care as evidenced by provider attestation and co-signature Inter-disciplinary care team collaboration (see longitudinal plan of care) Provided education to patient about basic DM disease process Reviewed medications with patient and discussed importance of medication adherence Discussed plans with patient for ongoing care management follow up and provided patient with direct contact information for care management team Provided patient with written educational materials related to hypo and hyperglycemia and importance of correct treatment Reviewed scheduled/upcoming provider appointments including: Dr. Zigmund Daniel 11/22/20, Podiatry  11/23/20 Referral made to social work team for assistance with POA's concerns on pt's options when her trust runs out Review of patient status, including review of consultants reports, relevant laboratory and other test results, and medications completed. Reviewed to try to increase her activity and to look into going to Senior exercise class in her building Self-Care Activities - Self administers oral medications as prescribed Attends all scheduled  provider appointments Adheres to prescribed ADA/carb modified Patient Goals: - change to whole grain breads, cereal, pasta - drink 6 to 8 glasses of water each day - fill half of plate with vegetables - read food labels for fat, fiber, carbohydrates and portion size - reduce red meat to 2 to 3 times a week - switch to low-fat or skim milk - switch to sugar-free drinks - keep appointment with eye doctor - check feet daily for cuts, sores or redness - keep feet up while sitting - trim toenails straight across - wash and dry feet carefully every day - wear comfortable, cotton socks - wear comfortable, well-fitting shoes Follow Up Plan: Telephone follow up appointment with care management team member scheduled for: 11/30/20 at 11:30 PM The patient has been provided with contact information for the care management team and has been advised to call with any health related questions or concerns.      Patient Care Plan: LCSW Plan of Care     Problem Identified: Maintain My Quality of Life.   Priority: High     Long-Range Goal: Maintain My Quality of Life.   Start Date: 11/08/2020  Expected End Date: 02/08/2021  This Visit's Progress: On track  Priority: High  Note:   Current Barriers:  Patient with Hypertension, Diabetes Mellitus Type II, Morbid Obesity, Moderate Intellectual Disabilities, Hyperlipidemia and Glaucoma needs Resources, Referrals, Education, Advocacy and Case Management Services to meet unmet needs in the home. Clinical Goal(s):  LCSW will work with Marlou Sa and Laural Benes (Healthcare and [place]) to identify and address any acute and/or chronic care coordination needs related to self-health management.  LCSW Interventions:  Inter-disciplinary care team collaboration (see longitudinal plan of care). Collaboration with Primary Care Physician, Dr. Grier Mitts regarding development and update of comprehensive plan of care as evidenced by  provider attestation and co-signature. Solution-Focused Strategies implemented, Advertising copywriter utilized and Multimedia programmer provided, when [employer] and Laural Benes voiced concerns about patient requiring a higher level of care, within the next 6-12 months.   Level of care options discussed, and complete list of assisted living facilities, within a 50-mile radius, provided. Patient Goals/Self-Care Activities: Marlou Sa and Laural Benes (Healthcare and [place]) will review list of assisted living facilities e-mailed to them, and agreed to begin touring facilities of interest. Contact LCSW directly (# [phone]) if you have questions, need assistance, or if additional social work needs are identified between now and our next scheduled telephone outreach call. Follow-Up Plan:  01/03/2021 at 9:00am

## 2020-11-08 NOTE — Chronic Care Management (AMB) (Signed)
Chronic Care Management    Clinical Social Work Note  11/08/2020 Name: Linda Thompson MRN: [id number] DOB: January 15, 1956  Linda Thompson is a 65 y.o. year old female who is a primary care patient of Volanda Napoleon, Langley Adie, MD. The CCM team was consulted to assist the patient with chronic disease management and/or care coordination needs related to: [employer] and Level of Care Concerns.   Engaged with Marlou Sa and Laural Benes (Healthcare and Buyer, retail) by telephone for initial visit in response to provider referral for social work chronic care management and care coordination services.   Consent to Services:  The patient was given information about Chronic Care Management services, agreed to services, and gave verbal consent prior to initiation of services.  Please see initial visit note for detailed documentation.   Patient agreed to services and consent obtained.   Assessment: Review of patient past medical history, allergies, medications, and health status, including review of relevant consultants reports was performed today as part of a comprehensive evaluation and provision of chronic care management and care coordination services.     SDOH (Social Determinants of Health) assessments and interventions performed:  SDOH Interventions    Flowsheet Row Most Recent Value  SDOH Interventions   Food Insecurity Interventions Intervention Not Indicated, Other (Comment)  [Verified by Marlou Sa and Laural Benes (Healthcare and Buyer, retail).]  Financial Strain Interventions Intervention Not Indicated, Other (Comment)  [Verified by Marlou Sa and Laural Benes (Healthcare and [place]).]  Housing Interventions Intervention Not Indicated, Other (Comment)  [Verified by Marlou Sa and Laural Benes (Healthcare and [place]).]  Intimate Partner Violence Interventions Intervention Not  Indicated, Other (Comment)  [Verified by Marlou Sa and Laural Benes (Scientist, research (life sciences)).]  Physical Activity Interventions Intervention Not Indicated, Other (Comments), Patient Refused  [Verified by Marlou Sa and Laural Benes (Healthcare and [place]).]  Stress Interventions Intervention Not Indicated, Other (Comment)  [Verified by Marlou Sa and Laural Benes (Healthcare and [place]).]  Social Connections Interventions Intervention Not Indicated, Other (Comment), Patient Refused  [Verified by Marlou Sa and Laural Benes (Healthcare and [place]).]  Transportation Interventions Intervention Not Indicated, Other (Comment)  [Verified by Marlou Sa and Laural Benes (Healthcare and [place]).]        Advanced Directives Status: See Care Plan for related entries.  CCM Care Plan  Allergies  Allergen Reactions   Ace Inhibitors Cough   Amoxicillin-Pot Clavulanate Diarrhea   Augmentin [Amoxicillin-Pot Clavulanate] Other (See Comments)    diarrhea   Azithromycin Diarrhea   Ceftin [Cefuroxime Axetil]     diarrhea   Cefuroxime Diarrhea and Other (See Comments)    Generalized weakness   Cyclobenzaprine Hcl Diarrhea   Flexeril [Cyclobenzaprine] Diarrhea   Flonase [Fluticasone Propionate] Other (See Comments)    Nose bleed   Ibuprofen Nausea And Vomiting    Outpatient Encounter Medications as of 11/08/2020  Medication Sig   acetaminophen (TYLENOL) 500 MG tablet Take 500 mg by mouth every 6 (six) hours as needed for mild pain.    B-12 MICROLOZENGE 500 MCG SUBL Place under the tongue.   Besifloxacin HCl (BESIVANCE) 0.6 % SUSP Apply to eye. 4 times each day for 2 days   [facility] B-12 500 MCG tablet TAKE 1 TABLET BY MOUTH DAILY   diclofenac sodium (VOLTAREN) 1 % GEL APPLY 2 GRAMS TO AFFECTED AREA 4 TIMES A DAY (  Patient taking differently: Apply 2 g  topically 4 (four) times daily.)   diltiazem (CARDIZEM CD) 240 MG 24 hr capsule Take 1 capsule (240 mg total) by mouth daily.   dorzolamide-timolol (COSOPT) 22.3-6.8 MG/ML ophthalmic solution SMARTSIG:In Eye(s)   ferrous sulfate 325 (65 FE) MG tablet Take 325 mg by mouth 3 (three) times daily with meals.    fluticasone (FLONASE) 50 MCG/ACT nasal spray IINSTILL 2 SPRAYS INTO EACH NOSTRIL AT BEDTIME   hydrochlorothiazide (MICROZIDE) 12.5 MG capsule TAKE 1 CAPSULE BY MOUTH EVERY DAY   hydrocortisone 2.5 % ointment APPLY TO AFFECTED AREA TWICE DAILY MONDAY THRU FRIDAY ONLY   ipratropium (ATROVENT) 0.06 % nasal spray INHALE 1 SPRAY INTO BOTH NOSTRILS 4 TIMES DAILY   ketoconazole (NIZORAL) 2 % cream SMARTSIG:1 Application Topical 1 to 2 Times Daily   ketoconazole (NIZORAL) 2 % shampoo USE AS DIRECTED TWICE A WEEK AS DIRECTED   latanoprost (XALATAN) 0.005 % ophthalmic solution Place 1 drop into both eyes at bedtime.    loratadine (CLARITIN) 10 MG tablet Take 1 tablet (10 mg total) by mouth daily.   lovastatin (MEVACOR) 10 MG tablet TAKE 2 TABLETS BY MOUTH AT BEDTIME   metFORMIN (GLUCOPHAGE) 1000 MG tablet TAKE 1 TABLET TWICE A DAY WITH A MEAL   mometasone (ELOCON) 0.1 % ointment APPLY A SMALL AMOUNT TO AFFECTED AREA AS DIRECTED   omeprazole (PRILOSEC) 20 MG capsule TAKE 1 CAPSULE BY MOUTH TWICE A DAY   Vitamin D, Cholecalciferol, 25 MCG (1000 UT) CAPS Take 1,000 Units by mouth daily.   Vitamins-Lipotropics ([employer] INNER EAR PLUS PO) Take 2 tablets by mouth 2 (two) times daily.    [DISCONTINUED] diltiazem (TIAZAC) 240 MG 24 hr capsule Take 1 capsule (240 mg total) by mouth daily.   No facility-administered encounter medications on file as of 11/08/2020.    Patient Active Problem List   Diagnosis Date Noted   Pain due to onychomycosis of toenails of both feet 07/07/2018   Diabetes mellitus without complication ([place]) 70/96/2836   Murmur 02/17/2018   Hypertension associated with diabetes ([place])  04/20/2017   Hyperlipidemia associated with type 2 diabetes mellitus ([place]) 04/20/2017   Arthritis 12/03/2015   Multiple gastric polyps 08/30/2015   Sarcoidosis 10/27/2014   Morbid obesity ([place]) 05/05/2011   Irritable bowel syndrome 03/05/2010   Diabetes ([place]) 08/13/2007   Allergic rhinitis 09/23/2006   Moderate intellectual disabilities 03/11/2006   Unspecified glaucoma 03/11/2006   Gastroesophageal reflux disease 03/11/2006    Conditions to be addressed/monitored:  Moderate Intellectual Disabilities.  Limited Access to Caregiver, Futures trader, Memory Deficits, Cognitive Deficits and Lacks Knowledge of [employer].  Care Plan : LCSW Plan of Care  Updates made by Francis Gaines, LCSW since 11/08/2020 12:00 AM     Problem: Maintain My Quality of Life.   Priority: High     Long-Range Goal: Maintain My Quality of Life.   Start Date: 11/08/2020  Expected End Date: 02/08/2021  This Visit's Progress: On track  Priority: High  Note:   Current Barriers:  Patient with Hypertension, Diabetes Mellitus Type II, Morbid Obesity, Moderate Intellectual Disabilities, Hyperlipidemia and Glaucoma needs Resources, Referrals, Education, Advocacy and Case Management Services to meet unmet needs in the home. Clinical Goal(s):  LCSW will work with Marlou Sa and Laural Benes (Healthcare and [place]) to identify and address any acute and/or chronic care coordination needs related to self-health management.  LCSW Interventions:  Inter-disciplinary care team collaboration (see longitudinal plan of care). Collaboration  with Primary Care Physician, Dr. Grier Mitts regarding development and update of comprehensive plan of care as evidenced by provider attestation and co-signature. Solution-Focused Strategies implemented, Advertising copywriter utilized and Multimedia programmer provided, when [employer] and Laural Benes voiced concerns about  patient requiring a higher level of care, within the next 6-12 months.   Level of care options discussed, and complete list of assisted living facilities, within a 50-mile radius, provided. Patient Goals/Self-Care Activities: Marlou Sa and Laural Benes (Healthcare and [place]) will review list of assisted living facilities e-mailed to them, and agreed to begin touring facilities of interest. Contact LCSW directly (# [phone]) if you have questions, need assistance, or if additional social work needs are identified between now and our next scheduled telephone outreach call. Follow-Up Plan:  01/03/2021 at [place] Social Worker [place] [phone]

## 2020-11-19 DIAGNOSIS — E785 Hyperlipidemia, unspecified: Secondary | ICD-10-CM

## 2020-11-19 DIAGNOSIS — I152 Hypertension secondary to endocrine disorders: Secondary | ICD-10-CM | POA: Diagnosis not present

## 2020-11-19 DIAGNOSIS — E1159 Type 2 diabetes mellitus with other circulatory complications: Secondary | ICD-10-CM | POA: Diagnosis not present

## 2020-11-19 DIAGNOSIS — E1169 Type 2 diabetes mellitus with other specified complication: Secondary | ICD-10-CM

## 2020-11-19 DIAGNOSIS — E119 Type 2 diabetes mellitus without complications: Secondary | ICD-10-CM

## 2020-11-19 DIAGNOSIS — I10 Essential (primary) hypertension: Secondary | ICD-10-CM

## 2020-11-19 DIAGNOSIS — H409 Unspecified glaucoma: Secondary | ICD-10-CM

## 2020-11-22 ENCOUNTER — Encounter (INDEPENDENT_AMBULATORY_CARE_PROVIDER_SITE_OTHER): Payer: Medicare Other | Admitting: Ophthalmology

## 2020-11-22 ENCOUNTER — Other Ambulatory Visit: Payer: Self-pay

## 2020-11-22 DIAGNOSIS — H34832 Tributary (branch) retinal vein occlusion, left eye, with macular edema: Secondary | ICD-10-CM | POA: Diagnosis not present

## 2020-11-22 DIAGNOSIS — H43813 Vitreous degeneration, bilateral: Secondary | ICD-10-CM

## 2020-11-22 DIAGNOSIS — H33302 Unspecified retinal break, left eye: Secondary | ICD-10-CM | POA: Diagnosis not present

## 2020-11-22 DIAGNOSIS — H35033 Hypertensive retinopathy, bilateral: Secondary | ICD-10-CM | POA: Diagnosis not present

## 2020-11-22 DIAGNOSIS — I10 Essential (primary) hypertension: Secondary | ICD-10-CM

## 2020-11-23 ENCOUNTER — Encounter: Payer: Self-pay | Admitting: Podiatry

## 2020-11-23 ENCOUNTER — Ambulatory Visit (INDEPENDENT_AMBULATORY_CARE_PROVIDER_SITE_OTHER): Payer: Medicare Other | Admitting: Podiatry

## 2020-11-23 DIAGNOSIS — B351 Tinea unguium: Secondary | ICD-10-CM | POA: Diagnosis not present

## 2020-11-23 DIAGNOSIS — E119 Type 2 diabetes mellitus without complications: Secondary | ICD-10-CM

## 2020-11-23 DIAGNOSIS — M79674 Pain in right toe(s): Secondary | ICD-10-CM | POA: Diagnosis not present

## 2020-11-23 DIAGNOSIS — M79675 Pain in left toe(s): Secondary | ICD-10-CM

## 2020-11-23 NOTE — Progress Notes (Signed)
This patient returns to my office for at risk foot care.  This patient requires this care by a professional since this patient will be at risk due to having type 2 diabetes.  This patient is unable to cut nails herself since the patient cannot reach her nails.These nails are painful walking and wearing shoes.  This patient presents for at risk foot care today.  General Appearance  Alert, conversant and in no acute stress.  Vascular  Dorsalis pedis and posterior tibial  pulses are palpable  bilaterally.  Capillary return is within normal limits  bilaterally. Temperature is within normal limits  bilaterally.  Neurologic  Senn-Weinstein monofilament wire test within normal limits  bilaterally. Muscle power within normal limits bilaterally.  Nails Thick disfigured discolored nails with subungual debris  from hallux to fifth toes bilaterally. No evidence of bacterial infection or drainage bilaterally.  Orthopedic  No limitations of motion  feet .  No crepitus or effusions noted.  HAV  B/L.  Hammer toes  B/L.  Skin  normotropic skin with no porokeratosis noted bilaterally.  No signs of infections or ulcers noted.     Onychomycosis  Pain in right toes  Pain in left toes  Consent was obtained for treatment procedures.   Mechanical debridement of nails 1-5  bilaterally performed with a nail nipper.  Filed with dremel without incident.    Return office visit  4   months                   Told patient to return for periodic foot care and evaluation due to potential at risk complications.    Ilianna Bown DPM  

## 2020-11-30 ENCOUNTER — Ambulatory Visit (INDEPENDENT_AMBULATORY_CARE_PROVIDER_SITE_OTHER): Payer: Medicare Other

## 2020-11-30 DIAGNOSIS — I10 Essential (primary) hypertension: Secondary | ICD-10-CM

## 2020-11-30 DIAGNOSIS — E119 Type 2 diabetes mellitus without complications: Secondary | ICD-10-CM

## 2020-11-30 DIAGNOSIS — E785 Hyperlipidemia, unspecified: Secondary | ICD-10-CM

## 2020-11-30 DIAGNOSIS — F71 Moderate intellectual disabilities: Secondary | ICD-10-CM

## 2020-11-30 DIAGNOSIS — E1169 Type 2 diabetes mellitus with other specified complication: Secondary | ICD-10-CM

## 2020-11-30 NOTE — Chronic Care Management (AMB) (Signed)
Chronic Care Management   CCM RN Visit Note  11/30/2020 Name: Linda Thompson MRN: [id number] DOB: [birth year]  Subjective: Linda Thompson is a 65 y.o. year old female who is a primary care patient of [name], MD. The care management team was consulted for assistance with disease management and care coordination needs.    Engaged with patient by telephone for follow up visit in response to provider referral for case management and/or care coordination services.   Consent to Services:  The patient was given information about Chronic Care Management services, agreed to services, and gave verbal consent prior to initiation of services.  Please see initial visit note for detailed documentation.   Patient agreed to services and verbal consent obtained.   Assessment: Review of patient past medical history, allergies, medications, health status, including review of consultants reports, laboratory and other test data, was performed as part of comprehensive evaluation and provision of chronic care management services.   SDOH (Social Determinants of Health) assessments and interventions performed:    CCM Care Plan  Allergies  Allergen Reactions   Ace Inhibitors Cough   Amoxicillin-Pot Clavulanate Diarrhea   Augmentin [Amoxicillin-Pot Clavulanate] Other (See Comments)    diarrhea   Azithromycin Diarrhea   Ceftin [Cefuroxime Axetil]     diarrhea   Cefuroxime Diarrhea and Other (See Comments)    Generalized weakness   Cyclobenzaprine Hcl Diarrhea   Flexeril [Cyclobenzaprine] Diarrhea   Flonase [Fluticasone Propionate] Other (See Comments)    Nose bleed   Ibuprofen Nausea And Vomiting    Outpatient Encounter Medications as of 11/30/2020  Medication Sig   acetaminophen (TYLENOL) 500 MG tablet Take 500 mg by mouth every 6 (six) hours as needed for mild pain.    B-12 MICROLOZENGE 500 MCG SUBL Place under the tongue.   Besifloxacin HCl (BESIVANCE) 0.6 % SUSP Apply to eye. 4 times  each day for 2 days   [facility] B-12 500 MCG tablet TAKE 1 TABLET BY MOUTH DAILY   diclofenac sodium (VOLTAREN) 1 % GEL APPLY 2 GRAMS TO AFFECTED AREA 4 TIMES A DAY (Patient taking differently: Apply 2 g topically 4 (four) times daily.)   diltiazem (CARDIZEM CD) 240 MG 24 hr capsule Take 1 capsule (240 mg total) by mouth daily.   dorzolamide-timolol (COSOPT) 22.3-6.8 MG/ML ophthalmic solution SMARTSIG:In Eye(s)   ferrous sulfate 325 (65 FE) MG tablet Take 325 mg by mouth 3 (three) times daily with meals.    fluticasone (FLONASE) 50 MCG/ACT nasal spray IINSTILL 2 SPRAYS INTO EACH NOSTRIL AT BEDTIME   hydrochlorothiazide (MICROZIDE) 12.5 MG capsule TAKE 1 CAPSULE BY MOUTH EVERY DAY   hydrocortisone 2.5 % ointment APPLY TO AFFECTED AREA TWICE DAILY MONDAY THRU FRIDAY ONLY   ipratropium (ATROVENT) 0.06 % nasal spray INHALE 1 SPRAY INTO BOTH NOSTRILS 4 TIMES DAILY   ketoconazole (NIZORAL) 2 % cream SMARTSIG:1 Application Topical 1 to 2 Times Daily   ketoconazole (NIZORAL) 2 % shampoo USE AS DIRECTED TWICE A WEEK AS DIRECTED   latanoprost (XALATAN) 0.005 % ophthalmic solution Place 1 drop into both eyes at bedtime.    loratadine (CLARITIN) 10 MG tablet Take 1 tablet (10 mg total) by mouth daily.   lovastatin (MEVACOR) 10 MG tablet TAKE 2 TABLETS BY MOUTH AT BEDTIME   metFORMIN (GLUCOPHAGE) 1000 MG tablet TAKE 1 TABLET TWICE A DAY WITH A MEAL   mometasone (ELOCON) 0.1 % ointment APPLY A SMALL AMOUNT TO AFFECTED AREA AS DIRECTED   omeprazole (PRILOSEC) 20 MG capsule  TAKE 1 CAPSULE BY MOUTH TWICE A DAY   Vitamin D, Cholecalciferol, 25 MCG (1000 UT) CAPS Take 1,000 Units by mouth daily.   Vitamins-Lipotropics ([facility] INNER EAR PLUS PO) Take 2 tablets by mouth 2 (two) times daily.    [DISCONTINUED] diltiazem (TIAZAC) 240 MG 24 hr capsule Take 1 capsule (240 mg total) by mouth daily.   No facility-administered encounter medications on file as of 11/30/2020.    Patient Active Problem List   Diagnosis Date Noted    Pain due to onychomycosis of toenails of both feet 07/07/2018   Diabetes mellitus without complication ([place]) 69/79/4801   Murmur 02/17/2018   Hypertension associated with diabetes ([place]) 04/20/2017   Hyperlipidemia associated with type 2 diabetes mellitus ([place]) 04/20/2017   Arthritis 12/03/2015   Multiple gastric polyps 08/30/2015   Sarcoidosis 10/27/2014   Morbid obesity ([place]) 05/05/2011   Irritable bowel syndrome 03/05/2010   Diabetes ([place]) 08/13/2007   Allergic rhinitis 09/23/2006   Moderate intellectual disabilities 03/11/2006   Unspecified glaucoma 03/11/2006   Gastroesophageal reflux disease 03/11/2006    Conditions to be addressed/monitored:HTN, HLD, DMII, and irritable bowel syndrome, constipation  moderate intellectual disabilities  Care Plan : Irritable Bowel Syndrome/Constipation  Updates made by Dimitri Ped, RN since 11/30/2020 12:00 AM     Problem: Symptoms (Irritable Bowel Syndrome/Constipation)   Priority: High     Long-Range Goal: Symptoms Managed   Start Date: 10/26/2020  Expected End Date: 03/20/2021  This Visit's Progress: On track  Recent Progress: On track  Priority: High  Note:   Current Barriers:  Ineffective Self Health Maintenance in a patient with  Irritable Bowel syndrome/Constipation  with hx of moderate intellectual disabilities  Unable to independently self manage Irritable Bowel syndrome/Constipation Unable to perform IADLs independently Pt has [place] with Desiree Hane and [name] [phone] Spoke with pt.  States she does take sugar free Metamucil daily.  States she is trying to eat more vegetables and fruit now.  States her bowels move most days and sometimes too much but she is doing OK now. Clinical Goal(s):  Collaboration with [name], MD regarding development and update of comprehensive plan of care as evidenced by provider attestation and co-signature Inter-disciplinary care team collaboration (see  longitudinal plan of care) patient will work with care management team to address care coordination and chronic disease management needs related to Disease Management Educational Needs Care Coordination   Interventions:  Evaluation of current treatment plan related to  self management of Irritable Bowel syndrome/Constipation , ADL IADL limitations and concerns that trust will run out   self-management and patient's adherence to plan as established by provider. Collaboration with [name], MD regarding development and update of comprehensive plan of care as evidenced by provider attestation       and co-signature Inter-disciplinary care team collaboration (see longitudinal plan of care) Discussed plans with patient for ongoing care management follow up and provided patient with direct contact information for care management team Reinforced to drink adequate amounts of fluids Reinforced to eat more foods with fiber to help with contipation Referral made to LCSW to discuss with POA pt's situation and her options in the future-LCSW working with POA on future options Self Care Activities:  Patient verbalizes understanding of plan to self manage Irritable Bowel syndrome/Constipation Self administers medications as prescribed Attends all scheduled provider appointments Calls pharmacy for medication refills Calls provider office for new concerns or questions Patient Goals: - avoid caffeine - avoid foods that  make my symptoms worse - drink at least 6 glasses of fluids a day - eat meals at regular times - limit fizzy (carbonated) drinks - limit fresh fruit to 3 servings per day - read food label for sorbitol and avoid these foods Follow Up Plan: Telephone follow up appointment with care management team member scheduled for: 01/25/21 at 11:30 AM The patient has been provided with contact information for the care management team and has been advised to call with any health related questions or  concerns.      Care Plan : Diabetes Type 2 (Adult)  Updates made by Dimitri Ped, RN since 11/30/2020 12:00 AM     Problem: Disease Progression (Diabetes, Type 2)   Priority: Medium     Long-Range Goal: Disease Progression Prevented or Minimized   Start Date: 10/26/2020  Expected End Date: 03/20/2021  This Visit's Progress: On track  Recent Progress: On track  Priority: Medium  Note:   Objective:  Lab Results  Component Value Date   HGBA1C 6.1 10/05/2020   Lab Results  Component Value Date   CREATININE 0.58 10/05/2020   CREATININE 0.58 11/10/2018   CREATININE 0.74 03/16/2018   No results found for: EGFR Current Barriers:  Knowledge Deficits related to basic Diabetes pathophysiology and self care/management Knowledge Deficits related to medications used for management of diabetes Unable to independently self manage Type 2 diabetes with hx of HTN and HLD Unable to perform IADLs independently Spoke with pt. States she is now eating more vegetables and fruit.  States pt does not need to check her CBG or B/P since she is well controlled and thinks it would be too complicated for her to do. States she does walk to get her mail daily but she has not gone to the exercise class in her building yet Case Manager Clinical Goal(s):  patient will demonstrate improved adherence to prescribed treatment plan for diabetes self care/management as evidenced by: adherence to ADA/ carb modified diet exercise 3 days/week adherence to prescribed medication regimen contacting provider for new or worsened symptoms or questions Interventions:  Collaboration with [name], MD regarding development and update of comprehensive plan of care as evidenced by provider attestation and co-signature Inter-disciplinary care team collaboration (see longitudinal plan of care) Provided education to patient about basic DM disease process Reviewed medications with patient and discussed importance of  medication adherence Discussed plans with patient for ongoing care management follow up and provided patient with direct contact information for care management team Provided patient with written educational materials related to hypo and hyperglycemia and importance of correct treatment Reviewed scheduled/upcoming provider appointments including: Dr. Zigmund Daniel 11/22/20, Podiatry 310/23, LCSW 01/03/21 Referral made to social work team for assistance with POA's concerns on pt's options when her trust runs out Review of patient status, including review of consultants reports, relevant laboratory and other test results, and medications completed. Reinforced to try to increase her activity and to look into going to Senior exercise class in her building Self-Care Activities - Self administers oral medications as prescribed Attends all scheduled provider appointments Adheres to prescribed ADA/carb modified Patient Goals: - change to whole grain breads, cereal, pasta - drink 6 to 8 glasses of water each day - fill half of plate with vegetables - read food labels for fat, fiber, carbohydrates and portion size - reduce red meat to 2 to 3 times a week - switch to low-fat or skim milk - switch to sugar-free drinks - keep appointment with eye  doctor - check feet daily for cuts, sores or redness - keep feet up while sitting - trim toenails straight across - wash and dry feet carefully every day - wear comfortable, cotton socks - wear comfortable, well-fitting shoes Follow Up Plan: Telephone follow up appointment with care management team member scheduled for: 01/25/21 at 11:30 PM The patient has been provided with contact information for the care management team and has been advised to call with any health related questions or concerns.       Plan:Telephone follow up appointment with care management team member scheduled for:  01/25/21 The patient has been provided with contact information for the care  management team and has been advised to call with any health related questions or concerns.  Peter Garter RN, Jackquline [place], CDE Care Management Coordinator [facility] [phone], Mobile [phone]

## 2020-11-30 NOTE — Patient Instructions (Signed)
Visit Information  Goals Addressed             This Visit's Progress    RNCM:Eat Healthy   On track    Timeframe:  Long-Range Goal Priority:  Medium Start Date:   11/30/20                          Expected End Date:    03/20/21                   Follow Up Date 01/25/21   - change to whole grain breads, cereal, pasta - drink 6 to 8 glasses of water each day - fill half of plate with vegetables - manage portion size - read food labels for fat, fiber, carbohydrates and portion size - reduce red meat to 2 to 3 times a week - switch to low-fat or skim milk - switch to sugar-free drinks    Why is this important?   When you are ready to manage your nutrition or weight, having a plan and setting goals will help.  Taking small steps to change how you eat and exercise is a good place to start.    Notes:      RNCM:Manage My Cholesterol   On track    Timeframe:  Long-Range Goal Priority:  Medium Start Date:     10/30/20                        Expected End Date:    03/20/21                   Follow Up Date 01/25/21   - change to whole grain breads, cereal, pasta - fill half the plate with nonstarchy vegetables - get blood test (fasting) done 1 week before next visit - increase the amount of fiber in food - read food labels for fat and fiber - switch to low-fat or skim milk    Why is this important?   Changing cholesterol starts with eating heart-healthy foods.  Other steps may be to increase your activity and to quit if you smoke.    Notes:      RNCM:Manage My Diet-Irritable Bowel Syndrome/Constipation   On track    Timeframe:  Long-Range Goal Priority:  High Start Date:   10/26/20                          Expected End Date:   03/20/21                    Follow Up Date 01/25/21    - avoid caffeine - avoid foods that make my symptoms worse - drink at least 6 glasses of fluids a day - eat meals at regular times - limit fizzy (carbonated) drinks - limit fresh fruit to 3 servings  per day - read food label for sorbitol and avoid these foods    Why is this important?   Changing what you eat and drink may help you to manage your condition.  It is important to take it slow, making 1 change at a time.  A dietitian is the best person to guide you.    Notes:          The patient verbalized understanding of instructions, educational materials, and care plan provided today and declined offer to receive copy of patient instructions, educational materials,  and care plan.   Telephone follow up appointment with care management team member scheduled for: 01/25/21 Peter Garter RN, [facility], CDE Care Management Coordinator [place] [phone], Mobile [phone]

## 2020-12-11 ENCOUNTER — Encounter: Payer: Self-pay | Admitting: Family Medicine

## 2020-12-11 DIAGNOSIS — M810 Age-related osteoporosis without current pathological fracture: Secondary | ICD-10-CM | POA: Insufficient documentation

## 2020-12-21 ENCOUNTER — Other Ambulatory Visit: Payer: Self-pay | Admitting: Family Medicine

## 2020-12-21 DIAGNOSIS — E1169 Type 2 diabetes mellitus with other specified complication: Secondary | ICD-10-CM

## 2020-12-27 ENCOUNTER — Encounter (INDEPENDENT_AMBULATORY_CARE_PROVIDER_SITE_OTHER): Payer: Medicare Other | Admitting: Ophthalmology

## 2020-12-27 ENCOUNTER — Other Ambulatory Visit: Payer: Self-pay

## 2020-12-27 DIAGNOSIS — I10 Essential (primary) hypertension: Secondary | ICD-10-CM | POA: Diagnosis not present

## 2020-12-27 DIAGNOSIS — H34832 Tributary (branch) retinal vein occlusion, left eye, with macular edema: Secondary | ICD-10-CM | POA: Diagnosis not present

## 2020-12-27 DIAGNOSIS — H43813 Vitreous degeneration, bilateral: Secondary | ICD-10-CM

## 2020-12-27 DIAGNOSIS — H35033 Hypertensive retinopathy, bilateral: Secondary | ICD-10-CM

## 2020-12-27 DIAGNOSIS — H33302 Unspecified retinal break, left eye: Secondary | ICD-10-CM

## 2021-01-03 ENCOUNTER — Ambulatory Visit (INDEPENDENT_AMBULATORY_CARE_PROVIDER_SITE_OTHER): Payer: Medicare Other | Admitting: *Deleted

## 2021-01-03 ENCOUNTER — Telehealth: Payer: Self-pay | Admitting: Family Medicine

## 2021-01-03 DIAGNOSIS — M1990 Unspecified osteoarthritis, unspecified site: Secondary | ICD-10-CM

## 2021-01-03 DIAGNOSIS — E119 Type 2 diabetes mellitus without complications: Secondary | ICD-10-CM

## 2021-01-03 DIAGNOSIS — F71 Moderate intellectual disabilities: Secondary | ICD-10-CM

## 2021-01-03 DIAGNOSIS — I10 Essential (primary) hypertension: Secondary | ICD-10-CM

## 2021-01-03 DIAGNOSIS — E785 Hyperlipidemia, unspecified: Secondary | ICD-10-CM

## 2021-01-03 DIAGNOSIS — H409 Unspecified glaucoma: Secondary | ICD-10-CM

## 2021-01-03 NOTE — Telephone Encounter (Signed)
Patient called because she states that she was supposed to speak with someone and they told her she would be receiving a call and if she doesn't receive the call by three then to call the office. Patient has gotten a bit confused and she has already received the call she needed from the social worker this morning. There is no one else (that we are aware of as of 12/15) that she needs to speak to.

## 2021-01-03 NOTE — Patient Instructions (Signed)
Visit Information  Thank you for taking time to visit with me today. Please don't hesitate to contact me if I can be of assistance to you before our next scheduled telephone appointment.  Following are the goals we discussed today:   Patient Goals/Self-Care Activities: Marlou Sa and Laural Benes (Scientist, research (life sciences)) will continue to work with LCSW, on a monthly basis, to receive assistance with long-term care placement for you. Marlou Sa and Laural Benes (Scientist, research (life sciences)) continue to review the list of assisted living facilities provided to them, and tour facilities of interest.   ~ Mrs. Richmond agreed to contact LCSW when she is ready for LCSW to request completed, signed, and dated FL-2 Form from Primary Care Physician, Dr. Grier Mitts. Marlou Sa and Laural Benes (Scientist, research (life sciences)) have been encouraged to provide LCSW with at least 3 assisted living facilities of interest, during our next scheduled telephone outreach call.  ~ LCSW agreed to fax completed, signed, and dated FL-2 Form to all facilities of interest, to try and pursue bed offers. Contact LCSW directly (# [phone]) if you have questions, need assistance, or if additional social work needs are identified.  Follow-Up Plan:  02/04/2021 at 10:00 am  Please call the care guide team at [phone] if you need to cancel or reschedule your appointment.   If you are experiencing a Mental Health or [place] or need someone to talk to, please call the Suicide and Crisis Lifeline: 988 call the [place] National Suicide Prevention Lifeline: (762) 362-8744 or TTY: 604-728-0929 TTY 737-871-2995) to talk to a trained counselor call 1-800-273-TALK (toll free, 24 hour hotline) go to [facility] [address] [place] [phone]) call the [place]:  [phone] call 911   Patient verbalizes understanding of instructions provided today and agrees to view in [place].   Nat Christen LCSW Licensed Clinical Social Worker [place] [phone]

## 2021-01-03 NOTE — Chronic Care Management (AMB) (Signed)
Chronic Care Management    Clinical Social Work Note  01/03/2021 Name: Linda Thompson MRN: [id number] DOB: 1955/11/29  Linda Thompson is a 65 y.o. year old female who is a primary care patient of Volanda Napoleon, Langley Adie, MD. The CCM team was consulted to assist the patient with chronic disease management and/or care coordination needs related to: [employer] and Level of Care Concerns.   Engaged with Marlou Sa by telephone for follow up visit in response to provider referral for social work chronic care management and care coordination services.   Consent to Services:  The patient was given information about Chronic Care Management services, agreed to services, and gave verbal consent prior to initiation of services.  Please see initial visit note for detailed documentation.   Patient agreed to services and consent obtained.   Assessment: Review of patient past medical history, allergies, medications, and health status, including review of relevant consultants reports was performed today as part of a comprehensive evaluation and provision of chronic care management and care coordination services.     SDOH (Social Determinants of Health) assessments and interventions performed:    Advanced Directives Status: Not addressed in this encounter.  CCM Care Plan  Allergies  Allergen Reactions   Ace Inhibitors Cough   Amoxicillin-Pot Clavulanate Diarrhea   Augmentin [Amoxicillin-Pot Clavulanate] Other (See Comments)    diarrhea   Azithromycin Diarrhea   Ceftin [Cefuroxime Axetil]     diarrhea   Cefuroxime Diarrhea and Other (See Comments)    Generalized weakness   Cyclobenzaprine Hcl Diarrhea   Flexeril [Cyclobenzaprine] Diarrhea   Flonase [Fluticasone Propionate] Other (See Comments)    Nose bleed   Ibuprofen Nausea And Vomiting    Outpatient Encounter Medications as of 01/03/2021  Medication Sig   acetaminophen (TYLENOL) 500 MG tablet Take 500 mg by mouth every 6 (six)  hours as needed for mild pain.    B-12 MICROLOZENGE 500 MCG SUBL Place under the tongue.   Besifloxacin HCl (BESIVANCE) 0.6 % SUSP Apply to eye. 4 times each day for 2 days   [facility] B-12 500 MCG tablet TAKE 1 TABLET BY MOUTH DAILY   diclofenac sodium (VOLTAREN) 1 % GEL APPLY 2 GRAMS TO AFFECTED AREA 4 TIMES A DAY (Patient taking differently: Apply 2 g topically 4 (four) times daily.)   diltiazem (CARDIZEM CD) 240 MG 24 hr capsule Take 1 capsule (240 mg total) by mouth daily.   dorzolamide-timolol (COSOPT) 22.3-6.8 MG/ML ophthalmic solution SMARTSIG:In Eye(s)   ferrous sulfate 325 (65 FE) MG tablet Take 325 mg by mouth 3 (three) times daily with meals.    fluticasone (FLONASE) 50 MCG/ACT nasal spray IINSTILL 2 SPRAYS INTO EACH NOSTRIL AT BEDTIME   hydrochlorothiazide (MICROZIDE) 12.5 MG capsule TAKE 1 CAPSULE BY MOUTH EVERY DAY   hydrocortisone 2.5 % ointment APPLY TO AFFECTED AREA TWICE DAILY MONDAY THRU FRIDAY ONLY   ipratropium (ATROVENT) 0.06 % nasal spray INHALE 1 SPRAY INTO BOTH NOSTRILS 4 TIMES DAILY   ketoconazole (NIZORAL) 2 % cream SMARTSIG:1 Application Topical 1 to 2 Times Daily   ketoconazole (NIZORAL) 2 % shampoo USE AS DIRECTED TWICE A WEEK AS DIRECTED   latanoprost (XALATAN) 0.005 % ophthalmic solution Place 1 drop into both eyes at bedtime.    loratadine (CLARITIN) 10 MG tablet Take 1 tablet (10 mg total) by mouth daily.   lovastatin (MEVACOR) 10 MG tablet TAKE 2 TABLETS BY MOUTH AT BEDTIME   metFORMIN (GLUCOPHAGE) 1000 MG tablet TAKE 1 TABLET TWICE A DAY  WITH A MEAL   mometasone (ELOCON) 0.1 % ointment APPLY A SMALL AMOUNT TO AFFECTED AREA AS DIRECTED   omeprazole (PRILOSEC) 20 MG capsule TAKE 1 CAPSULE BY MOUTH TWICE A DAY   Vitamin D, Cholecalciferol, 25 MCG (1000 UT) CAPS Take 1,000 Units by mouth daily.   Vitamins-Lipotropics ([facility] INNER EAR PLUS PO) Take 2 tablets by mouth 2 (two) times daily.    [DISCONTINUED] diltiazem (TIAZAC) 240 MG 24 hr capsule Take 1 capsule (240 mg  total) by mouth daily.   No facility-administered encounter medications on file as of 01/03/2021.    Patient Active Problem List   Diagnosis Date Noted   Osteoporosis 12/11/2020   Pain due to onychomycosis of toenails of both feet 07/07/2018   Diabetes mellitus without complication ([place]) 43/15/4008   Murmur 02/17/2018   Hypertension associated with diabetes ([place]) 04/20/2017   Hyperlipidemia associated with type 2 diabetes mellitus ([place]) 04/20/2017   Arthritis 12/03/2015   Multiple gastric polyps 08/30/2015   Sarcoidosis 10/27/2014   Morbid obesity ([place]) 05/05/2011   Irritable bowel syndrome 03/05/2010   Diabetes ([place]) 08/13/2007   Allergic rhinitis 09/23/2006   Moderate intellectual disabilities 03/11/2006   Unspecified glaucoma 03/11/2006   Gastroesophageal reflux disease 03/11/2006    Conditions to be addressed/monitored: HTN and DMII.  Limited Social Support, Level of Care Concerns, ADL/IADL Limitations, Literacy Concerns, Limited Access to Caregiver, Cognitive Deficits, and Lacks Knowledge of [employer].  Care Plan : LCSW Plan of Care  Updates made by Francis Gaines, LCSW since 01/03/2021 12:00 AM     Problem: Maintain My Quality of Life.   Priority: High     Long-Range Goal: Maintain My Quality of Life.   Start Date: 11/08/2020  Expected End Date: 04/04/2021  This Visit's Progress: On track  Recent Progress: On track  Priority: High  Note:   Current Barriers:  Patient with Hypertension, Diabetes Mellitus Type II, Morbid Obesity, Moderate Intellectual Disabilities, Hyperlipidemia and Glaucoma needs Resources, Referrals, Education, Advocacy and Case Management Services to meet unmet needs in the home. Clinical Goal(s):  LCSW will work with Marlou Sa and Laural Benes (Healthcare and [place]) to identify and address any acute and/or chronic care coordination needs related to self-health management.  LCSW Interventions:   Inter-disciplinary care team collaboration (see longitudinal plan of care). Collaboration with Primary Care Physician, Dr. Grier Mitts regarding development and update of comprehensive plan of care as evidenced by provider attestation and co-signature. Solution-Focused Strategies implemented, Advertising copywriter utilized and Multimedia programmer provided, when [employer] and Laural Benes voiced concerns about patient requiring a higher level of care, within the next 6-12 months.   Patient Goals/Self-Care Activities: Marlou Sa and Laural Benes (Scientist, research (life sciences)) will continue to work with LCSW, on a monthly basis, to receive assistance with long-term care placement for you. Marlou Sa and Laural Benes (Scientist, research (life sciences)) continue to review the list of assisted living facilities provided to them, and tour facilities of interest.   ~ Mrs. Richmond agreed to contact LCSW when she is ready for LCSW to request completed, signed, and dated FL-2 Form from Primary Care Physician, Dr. Grier Mitts. Marlou Sa and Laural Benes (Scientist, research (life sciences)) have been encouraged to provide LCSW with at least 3 assisted living facilities of interest, during our next scheduled telephone outreach call.  ~ LCSW agreed to fax completed, signed, and dated FL-2 Form to all facilities of interest, to try  and pursue bed offers. Contact LCSW directly (# [phone]) if you have questions, need assistance, or if additional social work needs are identified. Follow-Up Plan:  02/04/2021 at 10:00 am   [facility] Clinical Social Worker [place] [phone]

## 2021-01-25 ENCOUNTER — Telehealth: Payer: Medicare Other

## 2021-01-25 ENCOUNTER — Telehealth: Payer: Self-pay | Admitting: Family Medicine

## 2021-01-25 DIAGNOSIS — E1169 Type 2 diabetes mellitus with other specified complication: Secondary | ICD-10-CM

## 2021-01-25 MED ORDER — LOVASTATIN 10 MG PO TABS: 20.0000 mg | ORAL_TABLET | Freq: Every day | ORAL | 0 refills | Status: DC

## 2021-01-25 NOTE — Telephone Encounter (Signed)
Patient called to get refill on lovastatin (MEVACOR) 10 MG tablet    Please send to [id number] [place], [place] - [address] Phone:  [phone]  Fax:  [phone]         Please advise

## 2021-01-31 ENCOUNTER — Other Ambulatory Visit: Payer: Self-pay

## 2021-01-31 ENCOUNTER — Encounter (INDEPENDENT_AMBULATORY_CARE_PROVIDER_SITE_OTHER): Payer: Medicare Other | Admitting: Ophthalmology

## 2021-01-31 DIAGNOSIS — H2703 Aphakia, bilateral: Secondary | ICD-10-CM | POA: Diagnosis not present

## 2021-01-31 DIAGNOSIS — H43813 Vitreous degeneration, bilateral: Secondary | ICD-10-CM

## 2021-01-31 DIAGNOSIS — I10 Essential (primary) hypertension: Secondary | ICD-10-CM | POA: Diagnosis not present

## 2021-01-31 DIAGNOSIS — H35033 Hypertensive retinopathy, bilateral: Secondary | ICD-10-CM

## 2021-01-31 DIAGNOSIS — H33302 Unspecified retinal break, left eye: Secondary | ICD-10-CM | POA: Diagnosis not present

## 2021-01-31 DIAGNOSIS — H34832 Tributary (branch) retinal vein occlusion, left eye, with macular edema: Secondary | ICD-10-CM

## 2021-02-01 ENCOUNTER — Ambulatory Visit (INDEPENDENT_AMBULATORY_CARE_PROVIDER_SITE_OTHER): Payer: Medicare Other

## 2021-02-01 DIAGNOSIS — E1169 Type 2 diabetes mellitus with other specified complication: Secondary | ICD-10-CM

## 2021-02-01 DIAGNOSIS — E785 Hyperlipidemia, unspecified: Secondary | ICD-10-CM

## 2021-02-01 DIAGNOSIS — I10 Essential (primary) hypertension: Secondary | ICD-10-CM

## 2021-02-01 DIAGNOSIS — F71 Moderate intellectual disabilities: Secondary | ICD-10-CM

## 2021-02-01 DIAGNOSIS — K589 Irritable bowel syndrome without diarrhea: Secondary | ICD-10-CM

## 2021-02-01 DIAGNOSIS — E119 Type 2 diabetes mellitus without complications: Secondary | ICD-10-CM

## 2021-02-01 NOTE — Patient Instructions (Addendum)
Visit Information  Thank you for taking time to visit with me today. Please don't hesitate to contact me if I can be of assistance to you before our next scheduled telephone appointment.  Following are the goals we discussed today:  Take all medications as prescribed Attend all scheduled provider appointments Call pharmacy for medication refills 3-7 days in advance of running out of medications Call provider office for new concerns or questions  Work with the social worker to address care coordination needs and will continue to work with the clinical team to address health care and disease management related needs keep appointment with eye doctor check feet daily for cuts, sores or redness drink 6 to 8 glasses of water each day fill half of plate with vegetables keep feet up while sitting wash and dry feet carefully every day choose a place to take my blood pressure (home, clinic or office, retail store) call doctor for signs and symptoms of high blood pressure keep all doctor appointments take medications for blood pressure exactly as prescribed eat more whole grains, fruits and vegetables, lean meats and healthy fats limit salt intake to 2300mg /day call for medicine refill 2 or 3 days before it runs out take all medications exactly as prescribed - avoid caffeine - avoid foods that make my symptoms worse - drink at least 6 glasses of fluids a day - eat meals at regular times - limit fizzy (carbonated) drinks - limit fresh fruit to 3 servings per day - read food label for sorbitol and avoid these foodscall doctor with any symptoms you believe are related to your medicine  Our next appointment is by telephone on 03/29/21 at 11:30 AM  Please call the care guide team at [phone] if you need to cancel or reschedule your appointment.   If you are experiencing a Mental Health or [place] or need someone to talk to, please call the Suicide and Crisis Lifeline:  988 call the [place] National Suicide Prevention Lifeline: (385) 672-3804 or TTY: 534 774 9660 TTY 226-010-3537) to talk to a trained counselor call 1-800-273-TALK (toll free, 24 hour hotline) go to [facility] [address] [place] [phone]) call 911   The patient verbalized understanding of instructions, educational materials, and care plan provided today and agreed to receive a mailed copy of patient instructions, educational materials, and care plan.  Peter Garter RN, Jackquline Denmark, CDE Care Management Coordinator [facility] [phone], Mobile [phone]

## 2021-02-01 NOTE — Chronic Care Management (AMB) (Signed)
Chronic Care Management   CCM RN Visit Note  02/01/2021 Name: Linda Thompson MRN: [id number] DOB: January 20, 1956  Subjective: Linda Thompson is a 65 y.o. year old female who is a primary care patient of [name], MD. The care management team was consulted for assistance with disease management and care coordination needs.    Engaged with patient by telephone for follow up visit in response to provider referral for case management and/or care coordination services.   Consent to Services:  The patient was given information about Chronic Care Management services, agreed to services, and gave verbal consent prior to initiation of services.  Please see initial visit note for detailed documentation.   Patient agreed to services and verbal consent obtained.   Assessment: Review of patient past medical history, allergies, medications, health status, including review of consultants reports, laboratory and other test data, was performed as part of comprehensive evaluation and provision of chronic care management services.   SDOH (Social Determinants of Health) assessments and interventions performed:    CCM Care Plan  Allergies  Allergen Reactions   Ace Inhibitors Cough   Amoxicillin-Pot Clavulanate Diarrhea   Augmentin [Amoxicillin-Pot Clavulanate] Other (See Comments)    diarrhea   Azithromycin Diarrhea   Ceftin [Cefuroxime Axetil]     diarrhea   Cefuroxime Diarrhea and Other (See Comments)    Generalized weakness   Cyclobenzaprine Hcl Diarrhea   Flexeril [Cyclobenzaprine] Diarrhea   Flonase [Fluticasone Propionate] Other (See Comments)    Nose bleed   Ibuprofen Nausea And Vomiting    Outpatient Encounter Medications as of 02/01/2021  Medication Sig   acetaminophen (TYLENOL) 500 MG tablet Take 500 mg by mouth every 6 (six) hours as needed for mild pain.    B-12 MICROLOZENGE 500 MCG SUBL Place under the tongue.   Besifloxacin HCl (BESIVANCE) 0.6 % SUSP Apply to eye. 4 times  each day for 2 days   [facility] B-12 500 MCG tablet TAKE 1 TABLET BY MOUTH DAILY   diclofenac sodium (VOLTAREN) 1 % GEL APPLY 2 GRAMS TO AFFECTED AREA 4 TIMES A DAY (Patient taking differently: Apply 2 g topically 4 (four) times daily.)   diltiazem (CARDIZEM CD) 240 MG 24 hr capsule Take 1 capsule (240 mg total) by mouth daily.   dorzolamide-timolol (COSOPT) 22.3-6.8 MG/ML ophthalmic solution SMARTSIG:In Eye(s)   ferrous sulfate 325 (65 FE) MG tablet Take 325 mg by mouth 3 (three) times daily with meals.    fluticasone (FLONASE) 50 MCG/ACT nasal spray IINSTILL 2 SPRAYS INTO EACH NOSTRIL AT BEDTIME   hydrochlorothiazide (MICROZIDE) 12.5 MG capsule TAKE 1 CAPSULE BY MOUTH EVERY DAY   hydrocortisone 2.5 % ointment APPLY TO AFFECTED AREA TWICE DAILY MONDAY THRU FRIDAY ONLY   ipratropium (ATROVENT) 0.06 % nasal spray INHALE 1 SPRAY INTO BOTH NOSTRILS 4 TIMES DAILY   ketoconazole (NIZORAL) 2 % cream SMARTSIG:1 Application Topical 1 to 2 Times Daily   ketoconazole (NIZORAL) 2 % shampoo USE AS DIRECTED TWICE A WEEK AS DIRECTED   latanoprost (XALATAN) 0.005 % ophthalmic solution Place 1 drop into both eyes at bedtime.    loratadine (CLARITIN) 10 MG tablet Take 1 tablet (10 mg total) by mouth daily.   lovastatin (MEVACOR) 10 MG tablet Take 2 tablets (20 mg total) by mouth at bedtime.   metFORMIN (GLUCOPHAGE) 1000 MG tablet TAKE 1 TABLET TWICE A DAY WITH A MEAL   mometasone (ELOCON) 0.1 % ointment APPLY A SMALL AMOUNT TO AFFECTED AREA AS DIRECTED   omeprazole (PRILOSEC)  20 MG capsule TAKE 1 CAPSULE BY MOUTH TWICE A DAY   Vitamin D, Cholecalciferol, 25 MCG (1000 UT) CAPS Take 1,000 Units by mouth daily.   Vitamins-Lipotropics ([facility] INNER EAR PLUS PO) Take 2 tablets by mouth 2 (two) times daily.    [DISCONTINUED] diltiazem (TIAZAC) 240 MG 24 hr capsule Take 1 capsule (240 mg total) by mouth daily.   No facility-administered encounter medications on file as of 02/01/2021.    Patient Active Problem List    Diagnosis Date Noted   Osteoporosis 12/11/2020   Pain due to onychomycosis of toenails of both feet 07/07/2018   Diabetes mellitus without complication ([place]) 37/85/8850   Murmur 02/17/2018   Hypertension associated with diabetes ([place]) 04/20/2017   Hyperlipidemia associated with type 2 diabetes mellitus ([place]) 04/20/2017   Arthritis 12/03/2015   Multiple gastric polyps 08/30/2015   Sarcoidosis 10/27/2014   Morbid obesity ([place]) 05/05/2011   Irritable bowel syndrome 03/05/2010   Diabetes ([place]) 08/13/2007   Allergic rhinitis 09/23/2006   Moderate intellectual disabilities 03/11/2006   Unspecified glaucoma 03/11/2006   Gastroesophageal reflux disease 03/11/2006    Conditions to be addressed/monitored:HTN, HLD, DMII, and irritable bowel syndrome/constipation  Care Plan : Irritable Bowel Syndrome/Constipation  Updates made by Dimitri Ped, RN since 02/01/2021 12:00 AM     Problem: Symptoms (Irritable Bowel Syndrome/Constipation)   Priority: High     Long-Range Goal: Symptoms Managed   Start Date: 10/26/2020  Expected End Date: 03/20/2021  Recent Progress: On track  Priority: High  Note:   Current Barriers:  Ineffective Self Health Maintenance in a patient with  Irritable Bowel syndrome/Constipation  with hx of moderate intellectual disabilities  Unable to independently self manage Irritable Bowel syndrome/Constipation Unable to perform IADLs independently Pt has [place] with Desiree Hane and [name] [phone] Spoke with pt.  States she does take sugar free Metamucil daily.  States she is trying to eat more vegetables and fruit now.  States her bowels move most days and sometimes too much but she is doing OK now. Clinical Goal(s):  Collaboration with [name], MD regarding development and update of comprehensive plan of care as evidenced by provider attestation and co-signature Inter-disciplinary care team collaboration (see longitudinal plan of  care) patient will work with care management team to address care coordination and chronic disease management needs related to Disease Management Educational Needs Care Coordination   Interventions:  Evaluation of current treatment plan related to  self management of Irritable Bowel syndrome/Constipation , ADL IADL limitations and concerns that trust will run out   self-management and patient's adherence to plan as established by provider. Collaboration with [name], MD regarding development and update of comprehensive plan of care as evidenced by provider attestation       and co-signature Inter-disciplinary care team collaboration (see longitudinal plan of care) Discussed plans with patient for ongoing care management follow up and provided patient with direct contact information for care management team Reinforced to drink adequate amounts of fluids Reinforced to eat more foods with fiber to help with contipation Referral made to LCSW to discuss with POA pt's situation and her options in the future-LCSW working with POA on future options Self Care Activities:  Patient verbalizes understanding of plan to self manage Irritable Bowel syndrome/Constipation Self administers medications as prescribed Attends all scheduled provider appointments Calls pharmacy for medication refills Calls provider office for new concerns or questions Patient Goals: - avoid caffeine - avoid foods that make my symptoms worse -  drink at least 6 glasses of fluids a day - eat meals at regular times - limit fizzy (carbonated) drinks - limit fresh fruit to 3 servings per day - read food label for sorbitol and avoid these foods Follow Up Plan: Telephone follow up appointment with care management team member scheduled for: 01/25/21 at 11:30 AM The patient has been provided with contact information for the care management team and has been advised to call with any health related questions or concerns.       Care Plan : Diabetes Type 2 (Adult)  Updates made by Dimitri Ped, RN since 02/01/2021 12:00 AM     Problem: Disease Progression (Diabetes, Type 2)   Priority: Medium     Long-Range Goal: Disease Progression Prevented or Minimized   Start Date: 10/26/2020  Expected End Date: 03/20/2021  Recent Progress: On track  Priority: Medium  Note:   Objective:  Lab Results  Component Value Date   HGBA1C 6.1 10/05/2020   Lab Results  Component Value Date   CREATININE 0.58 10/05/2020   CREATININE 0.58 11/10/2018   CREATININE 0.74 03/16/2018   No results found for: EGFR Current Barriers:  Knowledge Deficits related to basic Diabetes pathophysiology and self care/management Knowledge Deficits related to medications used for management of diabetes Unable to independently self manage Type 2 diabetes with hx of HTN and HLD Unable to perform IADLs independently Spoke with pt. States she is now eating more vegetables and fruit.  States pt does not need to check her CBG or B/P since she is well controlled and thinks it would be too complicated for her to do. States she does walk to get her mail daily but she has not gone to the exercise class in her building yet Case Manager Clinical Goal(s):  patient will demonstrate improved adherence to prescribed treatment plan for diabetes self care/management as evidenced by: adherence to ADA/ carb modified diet exercise 3 days/week adherence to prescribed medication regimen contacting provider for new or worsened symptoms or questions Interventions:  Collaboration with [name], MD regarding development and update of comprehensive plan of care as evidenced by provider attestation and co-signature Inter-disciplinary care team collaboration (see longitudinal plan of care) Provided education to patient about basic DM disease process Reviewed medications with patient and discussed importance of medication adherence Discussed plans with patient for  ongoing care management follow up and provided patient with direct contact information for care management team Provided patient with written educational materials related to hypo and hyperglycemia and importance of correct treatment Reviewed scheduled/upcoming provider appointments including: Dr. Zigmund Daniel 11/22/20, Podiatry 310/23, LCSW 01/03/21 Referral made to social work team for assistance with POA's concerns on pt's options when her trust runs out Review of patient status, including review of consultants reports, relevant laboratory and other test results, and medications completed. Reinforced to try to increase her activity and to look into going to Senior exercise class in her building Self-Care Activities - Self administers oral medications as prescribed Attends all scheduled provider appointments Adheres to prescribed ADA/carb modified Patient Goals: - change to whole grain breads, cereal, pasta - drink 6 to 8 glasses of water each day - fill half of plate with vegetables - read food labels for fat, fiber, carbohydrates and portion size - reduce red meat to 2 to 3 times a week - switch to low-fat or skim milk - switch to sugar-free drinks - keep appointment with eye doctor - check feet daily for cuts, sores or redness -  keep feet up while sitting - trim toenails straight across - wash and dry feet carefully every day - wear comfortable, cotton socks - wear comfortable, well-fitting shoes Follow Up Plan: Telephone follow up appointment with care management team member scheduled for: 01/25/21 at 11:30 PM The patient has been provided with contact information for the care management team and has been advised to call with any health related questions or concerns.      Care Plan : RN Care Manager Plan of Care  Updates made by Dimitri Ped, RN since 02/01/2021 12:00 AM     Problem: Chronic Disease Management and Care Coordination Needs (DM, irritable bowel syndrome,HTN and HLD)    Priority: High     Long-Range Goal: Establish Plan of Care for Chronic Disease Management Needs (DM, irritable bowel syndrome,HTN and HLD)   Start Date: 02/01/2021  Expected End Date: 01/29/2022  Priority: High  Note:   Current Barriers:  Care Coordination needs related to Level of care concerns, ADL IADL limitations, Cognitive Deficits, and Inability to perform IADL's independently Chronic Disease Management support and education needs related to HTN, HLD, DMII, and irritable bowel syndrome  Cognitive Deficits Pt has POA [address] with [place] and [name] [phone] Spoke with pt.  States she has been taking sugar free Metamucil daily.  States she is trying to eat more vegetables and fruit now.  States her bowels move most days and sometimes too much but she is doing OK now.  Pt does not need to check her CBG or B/P since she is well controlled and thinks it would be too complicated for her to do. States she does walk to get her mail daily but she has not gone to the exercise class in her building yet.  States she has been going to the eye doctor to get shots in her eyes and she has been doing OK with giving herself eye drops after the shots.  RNCM Clinical Goal(s):  Patient will verbalize understanding of plan for management of HTN, HLD, DMII, and irritable bowel syndrome  as evidenced by voiced adherence to plan of care verbalize basic understanding of  HTN, HLD, DMII, and irritable bowel syndrome  disease process and self health management plan as evidenced by voiced understanding and teach back take all medications exactly as prescribed and will call provider for medication related questions as evidenced by dispense report and pt verbalization attend all scheduled medical appointments: eye 03/14/21, podiatry 03/29/21 as evidenced by medical records demonstrate Ongoing adherence to prescribed treatment plan for HTN, HLD, DMII, and irritable bowel syndrome  as evidenced by readings  within limits, voiced adherence to plan of care continue to work with RN Care Manager to address care management and care coordination needs related to  HTN, HLD, DMII, and irritable bowel syndrome  as evidenced by adherence to CM Team Scheduled appointments work with Education officer, museum to address  related to the management of Level of care concerns, ADL IADL limitations, Cognitive Deficits, and Inability to perform IADL's independently related to the management of HTN, HLD, DMII, and irritable bowel syndrome  as evidenced by review of EMR and patient or Education officer, museum report through collaboration with Consulting civil engineer, provider, and care team.   Interventions: 1:1 collaboration with primary care provider regarding development and update of comprehensive plan of care as evidenced by provider attestation and co-signature Inter-disciplinary care team collaboration (see longitudinal plan of care) Evaluation of current treatment plan related to  self management and patient's  adherence to plan as established by provider   Diabetes Interventions:  (Status:  Goal on track:  Yes.) Long Term Goal Assessed patient's understanding of A1c goal: <6.5% Provided education to patient about basic DM disease process Reviewed medications with patient and discussed importance of medication adherence Discussed plans with patient for ongoing care management follow up and provided patient with direct contact information for care management team Reviewed to try to walk more in her building for exercise Lab Results  Component Value Date   HGBA1C 6.1 10/05/2020   Irritable bowel syndrome/constipation   (Status:  Goal on track:  Yes.)  Long Term Goal Evaluation of current treatment plan related to  Irritable bowel syndrome/constipation  , Level of care concerns, ADL IADL limitations, Cognitive Deficits, and Inability to perform IADL's independently self-management and patient's adherence to plan as established by  provider. Discussed plans with patient for ongoing care management follow up and provided patient with direct contact information for care management team Evaluation of current treatment plan related to Irritable bowel syndrome/constipation  and patient's adherence to plan as established by provider Provided education to patient re: Irritable bowel syndrome/constipation  Social Work referral for [employer]  working with POA for pt's future need for higher level of care Reviewed eat foods with more fiber and to drink adequate amounts of fluids  Hyperlipidemia Interventions:  (Status:  Goal on track:  Yes.) Long Term Goal Medication review performed; medication list updated in electronic medical record.  Provider established cholesterol goals reviewed Counseled on importance of regular laboratory monitoring as prescribed Reviewed role and benefits of statin for ASCVD risk reduction Reviewed importance of limiting foods high in cholesterol  Hypertension Interventions:  (Status:  Goal on track:  Yes.) Long Term Goal Last practice recorded BP readings:  BP Readings from Last 3 Encounters:  10/05/20 124/82  06/12/20 (!) 150/80  09/14/19 118/78  Most recent eGFR/CrCl: No results found for: EGFR  No components found for: CRCL  Evaluation of current treatment plan related to hypertension self management and patient's adherence to plan as established by provider Provided education to patient re: stroke prevention, s/s of heart attack and stroke Provided education on prescribed diet low sodium low CHO Reviewed to have B/P checked when nurses visit her building  Patient Goals/Self-Care Activities: Take all medications as prescribed Attend all scheduled provider appointments Call pharmacy for medication refills 3-7 days in advance of running out of medications Call provider office for new concerns or questions  Work with the social worker to address care coordination needs and will continue to work with  the clinical team to address health care and disease management related needs keep appointment with eye doctor check feet daily for cuts, sores or redness drink 6 to 8 glasses of water each day fill half of plate with vegetables keep feet up while sitting wash and dry feet carefully every day choose a place to take my blood pressure (home, clinic or office, retail store) call doctor for signs and symptoms of high blood pressure keep all doctor appointments take medications for blood pressure exactly as prescribed eat more whole grains, fruits and vegetables, lean meats and healthy fats limit salt intake to 2315m/day call for medicine refill 2 or 3 days before it runs out take all medications exactly as prescribed call doctor with any symptoms you believe are related to your medicine - avoid caffeine - avoid foods that make my symptoms worse - drink at least 6 glasses of fluids a day -  eat meals at regular times - limit fizzy (carbonated) drinks - limit fresh fruit to 3 servings per day - read food label for sorbitol and avoid these foods Follow Up Plan:  Telephone follow up appointment with care management team member scheduled for:  04/05/21 The patient has been provided with contact information for the care management team and has been advised to call with any health related questions or concerns.       Plan:Telephone follow up appointment with care management team member scheduled for:  03/29/21 The patient has been provided with contact information for the care management team and has been advised to call with any health related questions or concerns.  Peter Garter RN, Jackquline Denmark, CDE Care Management Coordinator [facility] [phone], Mobile [phone]

## 2021-02-04 ENCOUNTER — Telehealth: Payer: Medicare Other

## 2021-02-04 ENCOUNTER — Telehealth: Payer: Medicare Other | Admitting: *Deleted

## 2021-02-04 ENCOUNTER — Telehealth: Payer: Self-pay | Admitting: *Deleted

## 2021-02-04 NOTE — Telephone Encounter (Signed)
°  Care Management   Follow Up Note   02/04/2021 Name: PREET MANGANO MRN: [id number] DOB: February 25, 1955   Referred by: [name], MD  Reason for referral : Chronic Care Management Needs in Patient with Hypertension, Diabetes Mellitus Type II, Morbid Obesity, Moderate Intellectual Disabilities - Cognitive Function of 66-Year-Old, Hyperlipidemia and Glaucoma.   LCSW received an In [employer] from Peter Garter, Embedded Nurse Case Manager with [facility] [place], encouraging LCSW to reschedule her follow-up appointment with [place] for Ms. Amedeo Plenty.  Ms. Helbling reported to Mrs. Sandlin that Mrs. Richmond would be out-of-the-office today (02/04/2021), in observance of Gwynne Edinger.  Ms. Viera further reported to Mrs. Sandlin that Mrs. Richmond has been unavailable due to contracting COVID-19.  LCSW has cancelled follow-up outreach call with Mrs. Richmond, scheduled for today (02/04/2021) at 10:00 am.   Follow-Up Plan:  Request placed with [place] to reschedule [place] [facility]'s follow-up outreach call with Christa See, newly Embedded Licensed Clinical Social Worker with [facility] [place].  Nat Christen LCSW Licensed Clinical Social Worker [place] [phone]

## 2021-02-05 NOTE — Progress Notes (Signed)
Scheduled with Dyanne Carrel 02/15/21  Thank you   [place] Management  Direct Dial: [phone]

## 2021-02-13 ENCOUNTER — Telehealth: Payer: Self-pay | Admitting: *Deleted

## 2021-02-13 NOTE — Chronic Care Management (AMB) (Signed)
°  Care Management   Note  02/13/2021 Name: Linda Thompson MRN: [id number] DOB: May 06, 1955  Linda Thompson is a 66 y.o. year old female who is a primary care patient of [name], MD and is actively engaged with the care management team. [place] office called by phone today to assist with canceling a follow up visit with the Licensed Clinical Social Worker due to [place] being out of the office.   Follow up plan: The care management team will reach out to the patient again over the next 7 days.  If patient returns call to provider office, please advise to call [place] at [phone].  [place] Management  Direct Dial: [phone]

## 2021-02-15 ENCOUNTER — Telehealth: Payer: Medicare Other

## 2021-02-19 DIAGNOSIS — Z7984 Long term (current) use of oral hypoglycemic drugs: Secondary | ICD-10-CM | POA: Diagnosis not present

## 2021-02-19 DIAGNOSIS — I10 Essential (primary) hypertension: Secondary | ICD-10-CM

## 2021-02-19 DIAGNOSIS — E1159 Type 2 diabetes mellitus with other circulatory complications: Secondary | ICD-10-CM

## 2021-02-19 DIAGNOSIS — E785 Hyperlipidemia, unspecified: Secondary | ICD-10-CM

## 2021-02-21 NOTE — Chronic Care Management (AMB) (Signed)
°  Care Management   Note  02/21/2021 Name: BRAELYNNE GARINGER MRN: [id number] DOB: 10/12/1955  Linda Thompson is a 66 y.o. year old female who is a primary care patient of [name], MD and is actively engaged with the care management team. I reached out to Tyrell Antonio by phone today to assist with re-scheduling a follow up visit with the Licensed Clinical Social Worker  Follow up plan: Unsuccessful telephone outreach attempt made. The care management team will reach out to the patient again over the next 14 days. If patient returns call to provider office, please advise to call [place] at [place] Management  Direct Dial: [phone]

## 2021-02-28 DIAGNOSIS — H401131 Primary open-angle glaucoma, bilateral, mild stage: Secondary | ICD-10-CM | POA: Diagnosis not present

## 2021-02-28 NOTE — Chronic Care Management (AMB) (Signed)
°  Care Management   Note  02/28/2021 Name: [name] MRN: [id number] DOB: 11-24-1955  Linda Thompson is a 66 y.o. year old female who is a primary care patient of [name], MD and is actively engaged with the care management team. I reached out to Tyrell Antonio by phone today to assist with re-scheduling a follow up visit with the Licensed Clinical Social Worker  Follow up plan: Unsuccessful telephone outreach attempt made. A HIPAA compliant phone message was left for the patient providing contact information and requesting a return call.  The care management team will reach out to the patient again over the next 7 days.  If patient returns call to provider office, please advise to call [facility] [place] Management  Direct Dial: [phone]

## 2021-03-08 NOTE — Chronic Care Management (AMB) (Signed)
°  Care Management   Note  03/08/2021 Name: Linda Thompson MRN: [id number] DOB: [birth year]  Linda Thompson is a 66 y.o. year old female who is a primary care patient of [name], MD and is actively engaged with the care management team. I reached out to Tyrell Antonio by phone today to assist with re-scheduling a follow up visit with the Licensed Clinical Social Worker  Follow up plan: Telephone appointment with care management team member scheduled for:03/21/21  [place], [place] Management  Direct Dial: [phone]

## 2021-03-14 ENCOUNTER — Encounter (INDEPENDENT_AMBULATORY_CARE_PROVIDER_SITE_OTHER): Payer: Medicare Other | Admitting: Ophthalmology

## 2021-03-14 ENCOUNTER — Other Ambulatory Visit: Payer: Self-pay

## 2021-03-14 DIAGNOSIS — H35033 Hypertensive retinopathy, bilateral: Secondary | ICD-10-CM | POA: Diagnosis not present

## 2021-03-14 DIAGNOSIS — H43813 Vitreous degeneration, bilateral: Secondary | ICD-10-CM

## 2021-03-14 DIAGNOSIS — I10 Essential (primary) hypertension: Secondary | ICD-10-CM

## 2021-03-14 DIAGNOSIS — H34832 Tributary (branch) retinal vein occlusion, left eye, with macular edema: Secondary | ICD-10-CM

## 2021-03-14 DIAGNOSIS — H33302 Unspecified retinal break, left eye: Secondary | ICD-10-CM

## 2021-03-19 ENCOUNTER — Other Ambulatory Visit: Payer: Self-pay | Admitting: Family Medicine

## 2021-03-19 DIAGNOSIS — E785 Hyperlipidemia, unspecified: Secondary | ICD-10-CM

## 2021-03-19 DIAGNOSIS — E1169 Type 2 diabetes mellitus with other specified complication: Secondary | ICD-10-CM

## 2021-03-21 ENCOUNTER — Ambulatory Visit (INDEPENDENT_AMBULATORY_CARE_PROVIDER_SITE_OTHER): Payer: Medicare Other | Admitting: Licensed Clinical Social Worker

## 2021-03-21 DIAGNOSIS — E119 Type 2 diabetes mellitus without complications: Secondary | ICD-10-CM

## 2021-03-21 DIAGNOSIS — I152 Hypertension secondary to endocrine disorders: Secondary | ICD-10-CM

## 2021-03-21 DIAGNOSIS — F71 Moderate intellectual disabilities: Secondary | ICD-10-CM

## 2021-03-21 DIAGNOSIS — E1159 Type 2 diabetes mellitus with other circulatory complications: Secondary | ICD-10-CM

## 2021-03-26 DIAGNOSIS — Z23 Encounter for immunization: Secondary | ICD-10-CM | POA: Diagnosis not present

## 2021-03-26 NOTE — Chronic Care Management (AMB) (Signed)
?Chronic Care Management  ? ? Clinical Social Work Note ? ?03/26/2021 ?Name: FATIMAZAHRA IRIAS MRN: [id number] DOB: 16-Feb-1955 ? ?EMELYNE BONCZEK is a 66 y.o. year old female who is a primary care patient of Salomon Fick, Bettey Mare, MD. The CCM team was consulted to assist the patient with chronic disease management and/or care coordination needs related to: Level of Care Concerns.  ? ?Engaged with patient by telephone for follow up visit in response to provider referral for social work chronic care management and care coordination services.  ? ?Consent to Services:  ?The patient was given information about Chronic Care Management services, agreed to services, and gave verbal consent prior to initiation of services.  Please see initial visit note for detailed documentation.  ? ?Patient agreed to services and consent obtained.  ? ?Assessment: Review of patient past medical history, allergies, medications, and health status, including review of relevant consultants reports was performed today as part of a comprehensive evaluation and provision of chronic care management and care coordination services.    ? ?SDOH (Social Determinants of Health) assessments and interventions performed:   ? ?Advanced Directives Status: Not addressed in this encounter. ? ?CCM Care Plan ? ?Allergies  ?Allergen Reactions  ? Ace Inhibitors Cough  ? Amoxicillin-Pot Clavulanate Diarrhea  ? Augmentin [Amoxicillin-Pot Clavulanate] Other (See Comments)  ?  diarrhea  ? Azithromycin Diarrhea  ? Ceftin [Cefuroxime Axetil]   ?  diarrhea  ? Cefuroxime Diarrhea and Other (See Comments)  ?  Generalized weakness  ? Cyclobenzaprine Hcl Diarrhea  ? Flexeril [Cyclobenzaprine] Diarrhea  ? Flonase [Fluticasone Propionate] Other (See Comments)  ?  Nose bleed  ? Ibuprofen Nausea And Vomiting  ? ? ?Outpatient Encounter Medications as of 03/21/2021  ?Medication Sig  ? acetaminophen (TYLENOL) 500 MG tablet Take 500 mg by mouth every 6 (six) hours as needed for mild pain.   ?  B-12 MICROLOZENGE 500 MCG SUBL Place under the tongue.  ? Besifloxacin HCl (BESIVANCE) 0.6 % SUSP Apply to eye. 4 times each day for 2 days  ? CVS B-12 500 MCG tablet TAKE 1 TABLET BY MOUTH DAILY  ? diclofenac sodium (VOLTAREN) 1 % GEL APPLY 2 GRAMS TO AFFECTED AREA 4 TIMES A DAY (Patient taking differently: Apply 2 g topically 4 (four) times daily.)  ? diltiazem (CARDIZEM CD) 240 MG 24 hr capsule Take 1 capsule (240 mg total) by mouth daily.  ? dorzolamide-timolol (COSOPT) 22.3-6.8 MG/ML ophthalmic solution SMARTSIG:In Eye(s)  ? ferrous sulfate 325 (65 FE) MG tablet Take 325 mg by mouth 3 (three) times daily with meals.   ? fluticasone (FLONASE) 50 MCG/ACT nasal spray IINSTILL 2 SPRAYS INTO EACH NOSTRIL AT BEDTIME  ? hydrochlorothiazide (MICROZIDE) 12.5 MG capsule TAKE 1 CAPSULE BY MOUTH EVERY DAY  ? hydrocortisone 2.5 % ointment APPLY TO AFFECTED AREA TWICE DAILY MONDAY THRU FRIDAY ONLY  ? ipratropium (ATROVENT) 0.06 % nasal spray INHALE 1 SPRAY INTO BOTH NOSTRILS 4 TIMES DAILY  ? ketoconazole (NIZORAL) 2 % cream SMARTSIG:1 Application Topical 1 to 2 Times Daily  ? ketoconazole (NIZORAL) 2 % shampoo USE AS DIRECTED TWICE A WEEK AS DIRECTED  ? latanoprost (XALATAN) 0.005 % ophthalmic solution Place 1 drop into both eyes at bedtime.   ? loratadine (CLARITIN) 10 MG tablet Take 1 tablet (10 mg total) by mouth daily.  ? lovastatin (MEVACOR) 10 MG tablet Take 2 tablets (20 mg total) by mouth at bedtime.  ? metFORMIN (GLUCOPHAGE) 1000 MG tablet TAKE 1 TABLET TWICE A DAY WITH  A MEAL  ? mometasone (ELOCON) 0.1 % ointment APPLY A SMALL AMOUNT TO AFFECTED AREA AS DIRECTED  ? omeprazole (PRILOSEC) 20 MG capsule TAKE 1 CAPSULE BY MOUTH TWICE A DAY  ? Vitamin D, Cholecalciferol, 25 MCG (1000 UT) CAPS Take 1,000 Units by mouth daily.  ? Vitamins-Lipotropics (CVS INNER EAR PLUS PO) Take 2 tablets by mouth 2 (two) times daily.   ? [DISCONTINUED] diltiazem (TIAZAC) 240 MG 24 hr capsule Take 1 capsule (240 mg total) by mouth daily.   ? ?No facility-administered encounter medications on file as of 03/21/2021.  ? ? ?Patient Active Problem List  ? Diagnosis Date Noted  ? Osteoporosis 12/11/2020  ? Pain due to onychomycosis of toenails of both feet 07/07/2018  ? Diabetes mellitus without complication (HCC) 07/07/2018  ? Murmur 02/17/2018  ? Hypertension associated with diabetes (HCC) 04/20/2017  ? Hyperlipidemia associated with type 2 diabetes mellitus (HCC) 04/20/2017  ? Arthritis 12/03/2015  ? Multiple gastric polyps 08/30/2015  ? Sarcoidosis 10/27/2014  ? Morbid obesity (HCC) 05/05/2011  ? Irritable bowel syndrome 03/05/2010  ? Diabetes (HCC) 08/13/2007  ? Allergic rhinitis 09/23/2006  ? Moderate intellectual disabilities 03/11/2006  ? Unspecified glaucoma 03/11/2006  ? Gastroesophageal reflux disease 03/11/2006  ? ? ?Conditions to be addressed/monitored: HTN, DMII, and Osteoporosis; Limited social support and Housing barriers ? ?Care Plan : LCSW Plan of Care  ?Updates made by Bridgett Larsson, LCSW since 03/26/2021 12:00 AM  ?  ? ?Problem: Maintain My Quality of Life.   ?Priority: High  ?  ? ?Long-Range Goal: Maintain My Quality of Life.   ?Start Date: 11/08/2020  ?Expected End Date: 04/04/2021  ?This Visit's Progress: On track  ?Recent Progress: On track  ?Priority: High  ?Note:   ?Current Barriers:  ?Patient with Hypertension, Diabetes Mellitus Type II, Morbid Obesity, Moderate Intellectual Disabilities, Hyperlipidemia and Glaucoma needs Resources, Referrals, Education, Advocacy and Case Management Services to meet unmet needs in the home. ?Clinical Goal(s):  ?LCSW will work with [name] and [name] (Healthcare and Financial Power's of Attorney) to identify and address any acute and/or chronic care coordination needs related to self-health management.  ?LCSW Interventions:  ?Inter-disciplinary care team collaboration (see longitudinal plan of care) ?Patient and POA concerned about pt's ability to afford Assisted Living. LCSW  strongly encouraged POA to apply for Special ASsistance LTC Medicaid and provided supportive information via email ?Patient enjoys current residence and states she takes her time walking. She maintains appointments by storing appt cards in purse and adding them to her calendar  ?Collaboration with Primary Care Physician, Dr. Abbe Amsterdam regarding development and update of comprehensive plan of care as evidenced by provider attestation and co-signature. ?Solution-Focused Strategies implemented, Attentive Listening Skills utilized and Doctor, general practice provided, when [employer] and [name] voiced concerns about patient requiring a higher level of care, within the next 6-12 months.   ?Patient Goals/Self-Care Activities: ?[name] and [name] (Dispensing optician) will continue to work with LCSW, on a monthly basis, to receive assistance with long-term care placement for you ?Contact LCSW directly (# [phone]) if you have questions, need assistance, or if additional social work needs are identified. ? ?  ?  ? ?Jenel Lucks, MSW, LCSW ?[facility] ?[place] ?[email or web address] ?Phone [phone] ?6:06 AM ? ? ? ?

## 2021-03-26 NOTE — Patient Instructions (Signed)
Visit Information ? ?Thank you for taking time to visit with me today. Please don't hesitate to contact me if I can be of assistance to you before our next scheduled telephone appointment. ? ?Following are the goals we discussed today:  ?Patient Goals/Self-Care Activities: ?Linda Thompson and Linda Thompson (Scientist, research (life sciences)) will continue to work with LCSW, on a monthly basis, to receive assistance with long-term care placement for you ?Contact LCSW directly (# [phone]) if you have questions, need assistance, or if additional social work needs are identified. ? ?Our next appointment is by telephone on 05/16/21 at 10:45 AM ? ?Please call the care guide team at [phone] if you need to cancel or reschedule your appointment.  ? ?If you are experiencing a Mental Health or [place] or need someone to talk to, please call 911  ? ?The patient verbalized understanding of instructions, educational materials, and care plan provided today and declined offer to receive copy of patient instructions, educational materials, and care plan.  ? ?Christa See, MSW, LCSW ?[place] [facility] ?[facility] ?[email or web address] ?Phone [phone] ?6:08 AM ?  ?

## 2021-03-29 ENCOUNTER — Ambulatory Visit (INDEPENDENT_AMBULATORY_CARE_PROVIDER_SITE_OTHER): Payer: Medicare Other | Admitting: Podiatry

## 2021-03-29 ENCOUNTER — Other Ambulatory Visit: Payer: Self-pay

## 2021-03-29 ENCOUNTER — Ambulatory Visit: Payer: Medicare Other | Admitting: Podiatry

## 2021-03-29 ENCOUNTER — Encounter: Payer: Self-pay | Admitting: Podiatry

## 2021-03-29 DIAGNOSIS — M79675 Pain in left toe(s): Secondary | ICD-10-CM

## 2021-03-29 DIAGNOSIS — M79674 Pain in right toe(s): Secondary | ICD-10-CM | POA: Diagnosis not present

## 2021-03-29 DIAGNOSIS — E119 Type 2 diabetes mellitus without complications: Secondary | ICD-10-CM | POA: Diagnosis not present

## 2021-03-29 DIAGNOSIS — B351 Tinea unguium: Secondary | ICD-10-CM

## 2021-03-29 NOTE — Progress Notes (Signed)

## 2021-04-01 ENCOUNTER — Ambulatory Visit: Payer: Medicare Other

## 2021-04-01 DIAGNOSIS — E1169 Type 2 diabetes mellitus with other specified complication: Secondary | ICD-10-CM

## 2021-04-01 DIAGNOSIS — F71 Moderate intellectual disabilities: Secondary | ICD-10-CM

## 2021-04-01 DIAGNOSIS — E119 Type 2 diabetes mellitus without complications: Secondary | ICD-10-CM

## 2021-04-01 DIAGNOSIS — E1159 Type 2 diabetes mellitus with other circulatory complications: Secondary | ICD-10-CM

## 2021-04-01 DIAGNOSIS — I152 Hypertension secondary to endocrine disorders: Secondary | ICD-10-CM

## 2021-04-01 DIAGNOSIS — E785 Hyperlipidemia, unspecified: Secondary | ICD-10-CM

## 2021-04-01 NOTE — Patient Instructions (Signed)
Visit Information ? ?Thank you for taking time to visit with me today. Please don't hesitate to contact me if I can be of assistance to you before our next scheduled telephone appointment. ? ?Following are the goals we discussed today:  ?Take all medications as prescribed ?Attend all scheduled provider appointments ?Call pharmacy for medication refills 3-7 days in advance of running out of medications ?Call provider office for new concerns or questions  ?Work with the Education officer, museum to address care coordination needs and will continue to work with the clinical team to address health care and disease management related needs ?keep appointment with eye doctor ?check feet daily for cuts, sores or redness ?drink 6 to 8 glasses of water each day ?fill half of plate with vegetables ?keep feet up while sitting ?wash and dry feet carefully every day ?choose a place to take my blood pressure (home, clinic or office, retail store) ?call doctor for signs and symptoms of high blood pressure ?keep all doctor appointments ?take medications for blood pressure exactly as prescribed ?eat more whole grains, fruits and vegetables, lean meats and healthy fats ?limit salt intake to '2300mg'$ /day ?call for medicine refill 2 or 3 days before it runs out ?take all medications exactly as prescribed ?call doctor with any symptoms you believe are related to your medicine ?avoid caffeine ?- avoid foods that make my symptoms worse ?- drink at least 6 glasses of fluids a day ?- eat meals at regular times ?- limit fizzy (carbonated) drinks ?- limit fresh fruit to 3 servings per day ?- read food label for sorbitol and avoid these foods ? ?Our next appointment is by telephone on 06/07/21 at 11:30 AM ? ?Please call the care guide team at [phone] if you need to cancel or reschedule your appointment.  ? ?If you are experiencing a Mental Health or [place] or need someone to talk to, please call the Suicide and Crisis Lifeline:  988 ?call the [place] National Suicide Prevention Lifeline: (417)037-6555 or TTY: 848 244 0234 TTY (847)567-9420) to talk to a trained counselor ?call 1-800-273-TALK (toll free, 24 hour hotline) ?go to [facility] [address], [place] [phone]) ?call 911  ? ?The patient verbalized understanding of instructions, educational materials, and care plan provided today and agreed to receive a mailed copy of patient instructions, educational materials, and care plan.  ? ?Peter Garter RN, BSN,CCM, CDE ?Care Management Coordinator ?[facility] ?[phone]   ?

## 2021-04-01 NOTE — Chronic Care Management (AMB) (Signed)
Chronic Care Management   CCM RN Visit Note  04/01/2021 Name: Linda Thompson MRN: [id number] DOB: [birth year]  Subjective: Linda Thompson is a 66 y.o. year old female who is a primary care patient of [name], MD. The care management team was consulted for assistance with disease management and care coordination needs.    Engaged with patient by telephone for follow up visit in response to provider referral for case management and/or care coordination services.   Consent to Services:  The patient was given information about Chronic Care Management services, agreed to services, and gave verbal consent prior to initiation of services.  Please see initial visit note for detailed documentation.   Patient agreed to services and verbal consent obtained.   Assessment: Review of patient past medical history, allergies, medications, health status, including review of consultants reports, laboratory and other test data, was performed as part of comprehensive evaluation and provision of chronic care management services.   SDOH (Social Determinants of Health) assessments and interventions performed:    CCM Care Plan  Allergies  Allergen Reactions   Ace Inhibitors Cough   Amoxicillin-Pot Clavulanate Diarrhea   Augmentin [Amoxicillin-Pot Clavulanate] Other (See Comments)    diarrhea   Azithromycin Diarrhea   Ceftin [Cefuroxime Axetil]     diarrhea   Cefuroxime Diarrhea and Other (See Comments)    Generalized weakness   Cyclobenzaprine Hcl Diarrhea   Flexeril [Cyclobenzaprine] Diarrhea   Flonase [Fluticasone Propionate] Other (See Comments)    Nose bleed   Ibuprofen Nausea And Vomiting    Outpatient Encounter Medications as of 04/01/2021  Medication Sig   acetaminophen (TYLENOL) 500 MG tablet Take 500 mg by mouth every 6 (six) hours as needed for mild pain.    B-12 MICROLOZENGE 500 MCG SUBL Place under the tongue.   Besifloxacin HCl (BESIVANCE) 0.6 % SUSP Apply to eye. 4 times  each day for 2 days   CVS B-12 500 MCG tablet TAKE 1 TABLET BY MOUTH DAILY   diclofenac sodium (VOLTAREN) 1 % GEL APPLY 2 GRAMS TO AFFECTED AREA 4 TIMES A DAY (Patient taking differently: Apply 2 g topically 4 (four) times daily.)   diltiazem (CARDIZEM CD) 240 MG 24 hr capsule Take 1 capsule (240 mg total) by mouth daily.   dorzolamide-timolol (COSOPT) 22.3-6.8 MG/ML ophthalmic solution SMARTSIG:In Eye(s)   ferrous sulfate 325 (65 FE) MG tablet Take 325 mg by mouth 3 (three) times daily with meals.    fluticasone (FLONASE) 50 MCG/ACT nasal spray IINSTILL 2 SPRAYS INTO EACH NOSTRIL AT BEDTIME   hydrochlorothiazide (MICROZIDE) 12.5 MG capsule TAKE 1 CAPSULE BY MOUTH EVERY DAY   hydrocortisone 2.5 % ointment APPLY TO AFFECTED AREA TWICE DAILY MONDAY THRU FRIDAY ONLY   ipratropium (ATROVENT) 0.06 % nasal spray INHALE 1 SPRAY INTO BOTH NOSTRILS 4 TIMES DAILY   ketoconazole (NIZORAL) 2 % cream SMARTSIG:1 Application Topical 1 to 2 Times Daily   ketoconazole (NIZORAL) 2 % shampoo USE AS DIRECTED TWICE A WEEK AS DIRECTED   latanoprost (XALATAN) 0.005 % ophthalmic solution Place 1 drop into both eyes at bedtime.    loratadine (CLARITIN) 10 MG tablet Take 1 tablet (10 mg total) by mouth daily.   lovastatin (MEVACOR) 10 MG tablet Take 2 tablets (20 mg total) by mouth at bedtime.   metFORMIN (GLUCOPHAGE) 1000 MG tablet TAKE 1 TABLET TWICE A DAY WITH A MEAL   mometasone (ELOCON) 0.1 % ointment APPLY A SMALL AMOUNT TO AFFECTED AREA AS DIRECTED   omeprazole (PRILOSEC)  20 MG capsule TAKE 1 CAPSULE BY MOUTH TWICE A DAY   Vitamin D, Cholecalciferol, 25 MCG (1000 UT) CAPS Take 1,000 Units by mouth daily.   Vitamins-Lipotropics (CVS INNER EAR PLUS PO) Take 2 tablets by mouth 2 (two) times daily.    [DISCONTINUED] diltiazem (TIAZAC) 240 MG 24 hr capsule Take 1 capsule (240 mg total) by mouth daily.   No facility-administered encounter medications on file as of 04/01/2021.    Patient Active Problem List    Diagnosis Date Noted   Osteoporosis 12/11/2020   Pain due to onychomycosis of toenails of both feet 07/07/2018   Diabetes mellitus without complication ([place]) 07/20/1599   Murmur 02/17/2018   Hypertension associated with diabetes ([place]) 04/20/2017   Hyperlipidemia associated with type 2 diabetes mellitus ([place]) 04/20/2017   Arthritis 12/03/2015   Multiple gastric polyps 08/30/2015   Sarcoidosis 10/27/2014   Morbid obesity ([place]) 05/05/2011   Irritable bowel syndrome 03/05/2010   Diabetes ([place]) 08/13/2007   Allergic rhinitis 09/23/2006   Moderate intellectual disabilities 03/11/2006   Unspecified glaucoma 03/11/2006   Gastroesophageal reflux disease 03/11/2006    Conditions to be addressed/monitored:HTN, HLD, DMII, and irritable bowel syndrome  Care Plan : RN Care Manager Plan of Care  Updates made by Dimitri Ped, RN since 04/01/2021 12:00 AM     Problem: Chronic Disease Management and Care Coordination Needs (DM, irritable bowel syndrome,HTN and HLD)   Priority: High     Long-Range Goal: Establish Plan of Care for Chronic Disease Management Needs (DM, irritable bowel syndrome,HTN and HLD)   Start Date: 02/01/2021  Expected End Date: 01/29/2022  Priority: High  Note:   Current Barriers:  Care Coordination needs related to Level of care concerns, ADL IADL limitations, Cognitive Deficits, and Inability to perform IADL's independently Chronic Disease Management support and education needs related to HTN, HLD, DMII, and irritable bowel syndrome  Cognitive Deficits Pt has POA [address] with [place] and [name] [phone] Spoke with pt.  States she has been taking sugar free Metamucil daily.  States she is trying to eat more vegetables and fruit now.  States her bowels move most days and sometimes too much but she is doing OK now. States she had a big BM on Friday but she has has BM that are normal for her since then.  Pt does not need to check her CBG or B/P since she is  well controlled and thinks it would be too complicated for her to do. States she is trying to eat enough vegetables and puts them on her shopping list. States she does walk to get her mail daily but she has not gone to the exercise class in her building yet.  States she has been going to the eye doctor to get shots in her eyes and she has been doing OK with giving herself eye drops after the shots.  Denies any falls or pain.  RNCM Clinical Goal(s):  Patient will verbalize understanding of plan for management of HTN, HLD, DMII, and irritable bowel syndrome  as evidenced by voiced adherence to plan of care verbalize basic understanding of  HTN, HLD, DMII, and irritable bowel syndrome  disease process and self health management plan as evidenced by voiced understanding and teach back take all medications exactly as prescribed and will call provider for medication related questions as evidenced by dispense report and pt verbalization attend all scheduled medical appointments: eye 04/11/21, CCM LCSW 05/16/21 podiatry 08/02/21 as evidenced by medical records demonstrate Ongoing adherence  to prescribed treatment plan for HTN, HLD, DMII, and irritable bowel syndrome  as evidenced by readings within limits, voiced adherence to plan of care continue to work with RN Care Manager to address care management and care coordination needs related to  HTN, HLD, DMII, and irritable bowel syndrome  as evidenced by adherence to CM Team Scheduled appointments work with Education officer, museum to address  related to the management of Level of care concerns, ADL IADL limitations, Cognitive Deficits, and Inability to perform IADL's independently related to the management of HTN, HLD, DMII, and irritable bowel syndrome  as evidenced by review of EMR and patient or Education officer, museum report through collaboration with Consulting civil engineer, provider, and care team.   Interventions: 1:1 collaboration with primary care provider regarding development and  update of comprehensive plan of care as evidenced by provider attestation and co-signature Inter-disciplinary care team collaboration (see longitudinal plan of care) Evaluation of current treatment plan related to  self management and patient's adherence to plan as established by provider   Diabetes Interventions:  (Status:  Goal on track:  Yes.) Long Term Goal Assessed patient's understanding of A1c goal: <6.5% Provided education to patient about basic DM disease process Reviewed medications with patient and discussed importance of medication adherence Discussed plans with patient for ongoing care management follow up and provided patient with direct contact information for care management team Reinforced to try to walk more in her building for exercise Lab Results  Component Value Date   HGBA1C 6.1 10/05/2020   Irritable bowel syndrome/constipation   (Status:  Goal on track:  Yes.)  Long Term Goal Evaluation of current treatment plan related to  Irritable bowel syndrome/constipation  , Level of care concerns, ADL IADL limitations, Cognitive Deficits, and Inability to perform IADL's independently self-management and patient's adherence to plan as established by provider. Discussed plans with patient for ongoing care management follow up and provided patient with direct contact information for care management team Evaluation of current treatment plan related to Irritable bowel syndrome/constipation  and patient's adherence to plan as established by provider Provided education to patient re: Irritable bowel syndrome/constipation  Social Work referral for [employer]  working with POA for pt's future need for higher level of care Reinforced eat foods with more fiber and to drink adequate amounts of fluids  Hyperlipidemia Interventions:  (Status:  Goal on track:  Yes.) Long Term Goal Medication review performed; medication list updated in electronic medical record.  Provider established cholesterol  goals reviewed Counseled on importance of regular laboratory monitoring as prescribed Reviewed role and benefits of statin for ASCVD risk reduction Reviewed importance of limiting foods high in cholesterol  Hypertension Interventions:  (Status:  Goal on track:  Yes.) Long Term Goal Last practice recorded BP readings:  BP Readings from Last 3 Encounters:  10/05/20 124/82  06/12/20 (!) 150/80  09/14/19 118/78  Most recent eGFR/CrCl: No results found for: EGFR  No components found for: CRCL  Evaluation of current treatment plan related to hypertension self management and patient's adherence to plan as established by provider Provided education to patient re: stroke prevention, s/s of heart attack and stroke Provided education on prescribed diet low sodium low CHO Reinforced to have B/P checked when nurses visit her building  Patient Goals/Self-Care Activities: Take all medications as prescribed Attend all scheduled provider appointments Call pharmacy for medication refills 3-7 days in advance of running out of medications Call provider office for new concerns or questions  Work with the social worker  to address care coordination needs and will continue to work with the clinical team to address health care and disease management related needs keep appointment with eye doctor check feet daily for cuts, sores or redness drink 6 to 8 glasses of water each day fill half of plate with vegetables keep feet up while sitting wash and dry feet carefully every day choose a place to take my blood pressure (home, clinic or office, retail store) call doctor for signs and symptoms of high blood pressure keep all doctor appointments take medications for blood pressure exactly as prescribed eat more whole grains, fruits and vegetables, lean meats and healthy fats limit salt intake to $RemoveB'2300mg'UnoeQsvD$ /day call for medicine refill 2 or 3 days before it runs out take all medications exactly as prescribed call  doctor with any symptoms you believe are related to your medicine - avoid caffeine - avoid foods that make my symptoms worse - drink at least 6 glasses of fluids a day - eat meals at regular times - limit fizzy (carbonated) drinks - limit fresh fruit to 3 servings per day - read food label for sorbitol and avoid these foods Follow Up Plan:  Telephone follow up appointment with care management team member scheduled for:  06/07/21/23 The patient has been provided with contact information for the care management team and has been advised to call with any health related questions or concerns.       Plan:Telephone follow up appointment with care management team member scheduled for:  06/07/21 The patient has been provided with contact information for the care management team and has been advised to call with any health related questions or concerns.  Peter Garter RN, Jackquline Denmark, CDE Care Management Coordinator [facility] [phone]

## 2021-04-05 ENCOUNTER — Telehealth: Payer: Medicare Other

## 2021-04-11 ENCOUNTER — Encounter (INDEPENDENT_AMBULATORY_CARE_PROVIDER_SITE_OTHER): Payer: Medicare Other | Admitting: Ophthalmology

## 2021-04-11 ENCOUNTER — Other Ambulatory Visit: Payer: Self-pay

## 2021-04-11 DIAGNOSIS — H34832 Tributary (branch) retinal vein occlusion, left eye, with macular edema: Secondary | ICD-10-CM | POA: Diagnosis not present

## 2021-04-11 DIAGNOSIS — H35033 Hypertensive retinopathy, bilateral: Secondary | ICD-10-CM

## 2021-04-11 DIAGNOSIS — H33302 Unspecified retinal break, left eye: Secondary | ICD-10-CM

## 2021-04-11 DIAGNOSIS — I10 Essential (primary) hypertension: Secondary | ICD-10-CM

## 2021-04-11 DIAGNOSIS — H43813 Vitreous degeneration, bilateral: Secondary | ICD-10-CM | POA: Diagnosis not present

## 2021-04-19 DIAGNOSIS — I10 Essential (primary) hypertension: Secondary | ICD-10-CM | POA: Diagnosis not present

## 2021-04-19 DIAGNOSIS — E785 Hyperlipidemia, unspecified: Secondary | ICD-10-CM | POA: Diagnosis not present

## 2021-04-19 DIAGNOSIS — E1159 Type 2 diabetes mellitus with other circulatory complications: Secondary | ICD-10-CM | POA: Diagnosis not present

## 2021-04-19 DIAGNOSIS — Z7984 Long term (current) use of oral hypoglycemic drugs: Secondary | ICD-10-CM

## 2021-05-13 ENCOUNTER — Other Ambulatory Visit: Payer: Self-pay | Admitting: Family Medicine

## 2021-05-14 ENCOUNTER — Other Ambulatory Visit: Payer: Self-pay | Admitting: Family Medicine

## 2021-05-15 ENCOUNTER — Encounter (INDEPENDENT_AMBULATORY_CARE_PROVIDER_SITE_OTHER): Payer: Medicare Other | Admitting: Ophthalmology

## 2021-05-15 DIAGNOSIS — H43813 Vitreous degeneration, bilateral: Secondary | ICD-10-CM | POA: Diagnosis not present

## 2021-05-15 DIAGNOSIS — H35033 Hypertensive retinopathy, bilateral: Secondary | ICD-10-CM | POA: Diagnosis not present

## 2021-05-15 DIAGNOSIS — I10 Essential (primary) hypertension: Secondary | ICD-10-CM | POA: Diagnosis not present

## 2021-05-15 DIAGNOSIS — H34832 Tributary (branch) retinal vein occlusion, left eye, with macular edema: Secondary | ICD-10-CM | POA: Diagnosis not present

## 2021-05-15 DIAGNOSIS — H33302 Unspecified retinal break, left eye: Secondary | ICD-10-CM | POA: Diagnosis not present

## 2021-05-16 ENCOUNTER — Ambulatory Visit (INDEPENDENT_AMBULATORY_CARE_PROVIDER_SITE_OTHER): Payer: Medicare Other | Admitting: Licensed Clinical Social Worker

## 2021-05-16 DIAGNOSIS — F71 Moderate intellectual disabilities: Secondary | ICD-10-CM

## 2021-05-16 DIAGNOSIS — E119 Type 2 diabetes mellitus without complications: Secondary | ICD-10-CM

## 2021-05-16 DIAGNOSIS — E1159 Type 2 diabetes mellitus with other circulatory complications: Secondary | ICD-10-CM

## 2021-05-16 DIAGNOSIS — E1169 Type 2 diabetes mellitus with other specified complication: Secondary | ICD-10-CM

## 2021-05-19 DIAGNOSIS — E1159 Type 2 diabetes mellitus with other circulatory complications: Secondary | ICD-10-CM | POA: Diagnosis not present

## 2021-05-19 DIAGNOSIS — E119 Type 2 diabetes mellitus without complications: Secondary | ICD-10-CM

## 2021-05-19 DIAGNOSIS — I152 Hypertension secondary to endocrine disorders: Secondary | ICD-10-CM

## 2021-05-19 DIAGNOSIS — E1169 Type 2 diabetes mellitus with other specified complication: Secondary | ICD-10-CM

## 2021-05-19 DIAGNOSIS — E785 Hyperlipidemia, unspecified: Secondary | ICD-10-CM

## 2021-05-21 NOTE — Chronic Care Management (AMB) (Signed)
?Chronic Care Management  ? ? Clinical Social Work Note ? ?05/21/2021 ?Name: IMANE COLYAR MRN: [id number] DOB: [birth year] ? ?SHARIMA MATT is a 66 y.o. year old female who is a primary care patient of Salomon Fick, Bettey Mare, MD. The CCM team was consulted to assist the patient with chronic disease management and/or care coordination needs related to: [employer] .  ? ?Engaged with patient by telephone and POA for follow up visit in response to provider referral for social work chronic care management and care coordination services.  ? ?Consent to Services:  ?The patient was given information about Chronic Care Management services, agreed to services, and gave verbal consent prior to initiation of services.  Please see initial visit note for detailed documentation.  ? ?Patient agreed to services and consent obtained.  ? ?Assessment: Review of patient past medical history, allergies, medications, and health status, including review of relevant consultants reports was performed today as part of a comprehensive evaluation and provision of chronic care management and care coordination services.    ? ?SDOH (Social Determinants of Health) assessments and interventions performed:   ? ?Advanced Directives Status: Not addressed in this encounter. ? ?CCM Care Plan ? ?Allergies  ?Allergen Reactions  ? Ace Inhibitors Cough  ? Amoxicillin-Pot Clavulanate Diarrhea  ? Augmentin [Amoxicillin-Pot Clavulanate] Other (See Comments)  ?  diarrhea  ? Azithromycin Diarrhea  ? Ceftin [Cefuroxime Axetil]   ?  diarrhea  ? Cefuroxime Diarrhea and Other (See Comments)  ?  Generalized weakness  ? Cyclobenzaprine Hcl Diarrhea  ? Flexeril [Cyclobenzaprine] Diarrhea  ? Flonase [Fluticasone Propionate] Other (See Comments)  ?  Nose bleed  ? Ibuprofen Nausea And Vomiting  ? ? ?Outpatient Encounter Medications as of 05/16/2021  ?Medication Sig  ? acetaminophen (TYLENOL) 500 MG tablet Take 500 mg by mouth every 6 (six) hours as needed for mild pain.    ? B-12 MICROLOZENGE 500 MCG SUBL Place under the tongue.  ? Besifloxacin HCl (BESIVANCE) 0.6 % SUSP Apply to eye. 4 times each day for 2 days  ? [facility] B-12 500 MCG tablet TAKE 1 TABLET BY MOUTH DAILY  ? diclofenac sodium (VOLTAREN) 1 % GEL APPLY 2 GRAMS TO AFFECTED AREA 4 TIMES A DAY (Patient taking differently: Apply 2 g topically 4 (four) times daily.)  ? diltiazem (CARDIZEM CD) 240 MG 24 hr capsule Take 1 capsule (240 mg total) by mouth daily.  ? dorzolamide-timolol (COSOPT) 22.3-6.8 MG/ML ophthalmic solution SMARTSIG:In Eye(s)  ? ferrous sulfate 325 (65 FE) MG tablet Take 325 mg by mouth 3 (three) times daily with meals.   ? fluticasone (FLONASE) 50 MCG/ACT nasal spray IINSTILL 2 SPRAYS INTO EACH NOSTRIL AT BEDTIME  ? hydrochlorothiazide (MICROZIDE) 12.5 MG capsule TAKE 1 CAPSULE BY MOUTH EVERY DAY  ? hydrocortisone 2.5 % ointment APPLY TO AFFECTED AREA TWICE DAILY MONDAY THRU FRIDAY ONLY  ? ipratropium (ATROVENT) 0.06 % nasal spray INHALE 1 SPRAY INTO BOTH NOSTRILS 4 TIMES DAILY  ? ketoconazole (NIZORAL) 2 % cream SMARTSIG:1 Application Topical 1 to 2 Times Daily  ? ketoconazole (NIZORAL) 2 % shampoo USE AS DIRECTED TWICE A WEEK AS DIRECTED  ? latanoprost (XALATAN) 0.005 % ophthalmic solution Place 1 drop into both eyes at bedtime.   ? loratadine (CLARITIN) 10 MG tablet Take 1 tablet (10 mg total) by mouth daily.  ? lovastatin (MEVACOR) 10 MG tablet Take 2 tablets (20 mg total) by mouth at bedtime.  ? metFORMIN (GLUCOPHAGE) 1000 MG tablet TAKE 1 TABLET TWICE A DAY  WITH A MEAL  ? mometasone (ELOCON) 0.1 % ointment APPLY A SMALL AMOUNT TO AFFECTED AREA AS DIRECTED  ? pantoprazole (PROTONIX) 40 MG tablet TAKE 1 TABLET BY MOUTH EVERY DAY  ? Vitamin D, Cholecalciferol, 25 MCG (1000 UT) CAPS Take 1,000 Units by mouth daily.  ? Vitamins-Lipotropics ([facility]S INNER EAR PLUS PO) Take 2 tablets by mouth 2 (two) times daily.   ? [DISCONTINUED] diltiazem (TIAZAC) 240 MG 24 hr capsule Take 1 capsule (240 mg total) by mouth  daily.  ? ?No facility-administered encounter medications on file as of 05/16/2021.  ? ? ?Patient Active Problem List  ? Diagnosis Date Noted  ? Osteoporosis 12/11/2020  ? Pain due to onychomycosis of toenails of both feet 07/07/2018  ? Diabetes mellitus without complication (HCC) 07/07/2018  ? Murmur 02/17/2018  ? Hypertension associated with diabetes (HCC) 04/20/2017  ? Hyperlipidemia associated with type 2 diabetes mellitus (HCC) 04/20/2017  ? Arthritis 12/03/2015  ? Multiple gastric polyps 08/30/2015  ? Sarcoidosis 10/27/2014  ? Morbid obesity (HCC) 05/05/2011  ? Irritable bowel syndrome 03/05/2010  ? Diabetes (HCC) 08/13/2007  ? Allergic rhinitis 09/23/2006  ? Moderate intellectual disabilities 03/11/2006  ? Unspecified glaucoma 03/11/2006  ? Gastroesophageal reflux disease 03/11/2006  ? ? ?Conditions to be addressed/monitored:  Limited social support and Housing barriers ? ?Care Plan : LCSW Plan of Care  ?Updates made by Bridgett Larsson, LCSW since 05/21/2021 12:00 AM  ?  ? ?Problem: Maintain My Quality of Life.   ?Priority: High  ?  ? ?Long-Range Goal: Maintain My Quality of Life.   ?Start Date: 11/08/2020  ?Expected End Date: 08/19/2021  ?This Visit's Progress: On track  ?Recent Progress: On track  ?Priority: High  ?Note:   ?Current Barriers:  ?Patient with Hypertension, Diabetes Mellitus Type II, Morbid Obesity, Moderate Intellectual Disabilities, Hyperlipidemia and Glaucoma needs Resources, Referrals, Education, Advocacy and Case Management Services to meet unmet needs in the home. ?Clinical Goal(s):  ?LCSW will work with [name] and [name] (Healthcare and Financial Power's of Attorney) to identify and address any acute and/or chronic care coordination needs related to self-health management.  ?LCSW Interventions:  ?Inter-disciplinary care team collaboration (see longitudinal plan of care) ?Patient and POA concerned about pt's ability to afford Assisted Living. LCSW strongly encouraged POA to  apply for Special [employer] LTC Medicaid and provided supportive information via email. POA has no questions or concerns at this time ?Patient utilizes facility's transportation services to run errands ?Patient reports POA, Vikki Ports, provided her med refills yesterday. CCM LCSW spoke with POA, who received information and has no questions at this time ?LCSW reviewed upcoming appointments ?Patient enjoys current residence and states she takes her time walking. She maintains appointments by storing appt cards in purse and adding them to her calendar  ?Collaboration with Primary Care Physician, Dr. Abbe Amsterdam regarding development and update of comprehensive plan of care as evidenced by provider attestation and co-signature. ?Solution-Focused Strategies implemented, Attentive Listening Skills utilized and Doctor, general practice provided, when [employer] and [name] voiced concerns about patient requiring a higher level of care, within the next 6-12 months.   ?Patient Goals/Self-Care Activities: ?[name] and [name] (Dispensing optician) will continue to work with LCSW, on a monthly basis, to receive assistance with long-term care placement for you ?Contact LCSW directly (# [phone]) if you have questions, need assistance, or if additional social work needs are identified. ? ? ?  ?  ? ?Follow Up Plan: Appointment scheduled  for SW follow up with client by phone on: 07/04/21 ?     ?Jenel Lucks, MSW, LCSW ?[facility] ?[place]  [facility] ?[email or web address] ?Phone [phone] ?8:07 AM ? ? ? ?

## 2021-05-21 NOTE — Patient Instructions (Signed)
Visit Information ? ?Thank you for taking time to visit with me today. Please don't hesitate to contact me if I can be of assistance to you before our next scheduled telephone appointment. ? ?Following are the goals we discussed today:  ?Patient Goals/Self-Care Activities: ?Marlou Sa and Laural Benes (Scientist, research (life sciences)) will continue to work with LCSW, on a monthly basis, to receive assistance with long-term care placement for you ?Contact LCSW directly (# [phone]) if you have questions, need assistance, or if additional social work needs are identified. ? ?Our next appointment is by telephone on 07/04/21 at 10:00 AM ? ?Please call the care guide team at [phone] if you need to cancel or reschedule your appointment.  ? ?If you are experiencing a Mental Health or [place] or need someone to talk to, please call 911  ? ?The patient verbalized understanding of instructions, educational materials, and care plan provided today and declined offer to receive copy of patient instructions, educational materials, and care plan.  ? ?Christa See, MSW, LCSW ?[facility] ?[facility] ?[email or web address] ?Phone [phone] ?8:09 AM ?  ?

## 2021-06-07 ENCOUNTER — Ambulatory Visit (INDEPENDENT_AMBULATORY_CARE_PROVIDER_SITE_OTHER): Payer: Medicare Other

## 2021-06-07 DIAGNOSIS — E119 Type 2 diabetes mellitus without complications: Secondary | ICD-10-CM

## 2021-06-07 DIAGNOSIS — E1169 Type 2 diabetes mellitus with other specified complication: Secondary | ICD-10-CM

## 2021-06-07 DIAGNOSIS — K589 Irritable bowel syndrome without diarrhea: Secondary | ICD-10-CM

## 2021-06-07 DIAGNOSIS — F71 Moderate intellectual disabilities: Secondary | ICD-10-CM

## 2021-06-07 DIAGNOSIS — E1159 Type 2 diabetes mellitus with other circulatory complications: Secondary | ICD-10-CM

## 2021-06-07 NOTE — Patient Instructions (Signed)
Visit Information  Thank you for taking time to visit with me today. Please don't hesitate to contact me if I can be of assistance to you before our next scheduled telephone appointment.  Following are the goals we discussed today:  Take all medications as prescribed Attend all scheduled provider appointments Call pharmacy for medication refills 3-7 days in advance of running out of medications Call provider office for new concerns or questions  Work with the social worker to address care coordination needs and will continue to work with the clinical team to address health care and disease management related needs keep appointment with eye doctor check feet daily for cuts, sores or redness drink 6 to 8 glasses of water each day fill half of plate with vegetables keep feet up while sitting wash and dry feet carefully every day choose a place to take my blood pressure (home, clinic or office, retail store) call doctor for signs and symptoms of high blood pressure keep all doctor appointments take medications for blood pressure exactly as prescribed eat more whole grains, fruits and vegetables, lean meats and healthy fats limit salt intake to '2300mg'$ /day call for medicine refill 2 or 3 days before it runs out take all medications exactly as prescribed call doctor with any symptoms you believe are related to your medicine  avoid caffeine - avoid foods that make my symptoms worse - drink at least 6 glasses of fluids a day - eat meals at regular times - limit fizzy (carbonated) drinks - limit fresh fruit to 3 servings per day - read food label for sorbitol and avoid these foods Our next appointment is by telephone on 08/16/21 at 11:30 AM  Please call the care guide team at [phone] if you need to cancel or reschedule your appointment.   If you are experiencing a Mental Health or [place] or need someone to talk to, please call the Suicide and Crisis Lifeline: 988 call  the [place] National Suicide Prevention Lifeline: 805-461-0165 or TTY: 740-568-0925 TTY 402-540-4223) to talk to a trained counselor call 1-800-273-TALK (toll free, 24 hour hotline) go to [facility] [address] [place] [phone]) call 911   The patient verbalized understanding of instructions, educational materials, and care plan provided today and agreed to receive a mailed copy of patient instructions, educational materials, and care plan.   Peter Garter RN, Jackquline Denmark, CDE Care Management Coordinator [facility] [phone]

## 2021-06-07 NOTE — Chronic Care Management (AMB) (Signed)
Chronic Care Management   CCM RN Visit Note  06/07/2021 Name: Linda Thompson MRN: [id number] DOB: 11/12/1955  Subjective: Linda Thompson is a 66 y.o. year old female who is a primary care patient of Linda Ruddy, MD. The care management team was consulted for assistance with disease management and care coordination needs.    Engaged with patient by telephone for follow up visit in response to provider referral for case management and/or care coordination services.   Consent to Services:  The patient was given information about Chronic Care Management services, agreed to services, and gave verbal consent prior to initiation of services.  Please see initial visit note for detailed documentation.   Patient agreed to services and verbal consent obtained.   Assessment: Review of patient past medical history, allergies, medications, health status, including review of consultants reports, laboratory and other test data, was performed as part of comprehensive evaluation and provision of chronic care management services.   SDOH (Social Determinants of Health) assessments and interventions performed:    CCM Care Plan  Allergies  Allergen Reactions   Ace Inhibitors Cough   Amoxicillin-Pot Clavulanate Diarrhea   Augmentin [Amoxicillin-Pot Clavulanate] Other (See Comments)    diarrhea   Azithromycin Diarrhea   Ceftin [Cefuroxime Axetil]     diarrhea   Cefuroxime Diarrhea and Other (See Comments)    Generalized weakness   Cyclobenzaprine Hcl Diarrhea   Flexeril [Cyclobenzaprine] Diarrhea   Flonase [Fluticasone Propionate] Other (See Comments)    Nose bleed   Ibuprofen Nausea And Vomiting    Outpatient Encounter Medications as of 06/07/2021  Medication Sig   acetaminophen (TYLENOL) 500 MG tablet Take 500 mg by mouth every 6 (six) hours as needed for mild pain.    B-12 MICROLOZENGE 500 MCG SUBL Place under the tongue.   Besifloxacin HCl (BESIVANCE) 0.6 % SUSP Apply to eye. 4 times  each day for 2 days   [facility] B-12 500 MCG tablet TAKE 1 TABLET BY MOUTH DAILY   diclofenac sodium (VOLTAREN) 1 % GEL APPLY 2 GRAMS TO AFFECTED AREA 4 TIMES A DAY (Patient taking differently: Apply 2 g topically 4 (four) times daily.)   diltiazem (CARDIZEM CD) 240 MG 24 hr capsule Take 1 capsule (240 mg total) by mouth daily.   dorzolamide-timolol (COSOPT) 22.3-6.8 MG/ML ophthalmic solution SMARTSIG:In Eye(s)   ferrous sulfate 325 (65 FE) MG tablet Take 325 mg by mouth 3 (three) times daily with meals.    fluticasone (FLONASE) 50 MCG/ACT nasal spray IINSTILL 2 SPRAYS INTO EACH NOSTRIL AT BEDTIME   hydrochlorothiazide (MICROZIDE) 12.5 MG capsule TAKE 1 CAPSULE BY MOUTH EVERY DAY   hydrocortisone 2.5 % ointment APPLY TO AFFECTED AREA TWICE DAILY MONDAY THRU FRIDAY ONLY   ipratropium (ATROVENT) 0.06 % nasal spray INHALE 1 SPRAY INTO BOTH NOSTRILS 4 TIMES DAILY   ketoconazole (NIZORAL) 2 % cream SMARTSIG:1 Application Topical 1 to 2 Times Daily   ketoconazole (NIZORAL) 2 % shampoo USE AS DIRECTED TWICE A WEEK AS DIRECTED   latanoprost (XALATAN) 0.005 % ophthalmic solution Place 1 drop into both eyes at bedtime.    loratadine (CLARITIN) 10 MG tablet Take 1 tablet (10 mg total) by mouth daily.   lovastatin (MEVACOR) 10 MG tablet Take 2 tablets (20 mg total) by mouth at bedtime.   metFORMIN (GLUCOPHAGE) 1000 MG tablet TAKE 1 TABLET TWICE A DAY WITH A MEAL   mometasone (ELOCON) 0.1 % ointment APPLY A SMALL AMOUNT TO AFFECTED AREA AS DIRECTED   pantoprazole (PROTONIX)  40 MG tablet TAKE 1 TABLET BY MOUTH EVERY DAY   Vitamin D, Cholecalciferol, 25 MCG (1000 UT) CAPS Take 1,000 Units by mouth daily.   Vitamins-Lipotropics (CVS INNER EAR PLUS PO) Take 2 tablets by mouth 2 (two) times daily.    [DISCONTINUED] diltiazem (TIAZAC) 240 MG 24 hr capsule Take 1 capsule (240 mg total) by mouth daily.   No facility-administered encounter medications on file as of 06/07/2021.    Patient Active Problem List   Diagnosis  Date Noted   Osteoporosis 12/11/2020   Pain due to onychomycosis of toenails of both feet 07/07/2018   Diabetes mellitus without complication ([place]) 45/03/8880   Murmur 02/17/2018   Hypertension associated with diabetes ([place]) 04/20/2017   Hyperlipidemia associated with type 2 diabetes mellitus ([place]) 04/20/2017   Arthritis 12/03/2015   Multiple gastric polyps 08/30/2015   Sarcoidosis 10/27/2014   Morbid obesity ([place]) 05/05/2011   Irritable bowel syndrome 03/05/2010   Diabetes ([place]) 08/13/2007   Allergic rhinitis 09/23/2006   Moderate intellectual disabilities 03/11/2006   Unspecified glaucoma 03/11/2006   Gastroesophageal reflux disease 03/11/2006    Conditions to be addressed/monitored:HTN, HLD, DMII, and irritable bowel syndrome  Care Plan : RN Care Manager Plan of Care  Updates made by Linda Ped, RN since 06/07/2021 12:00 AM     Problem: Chronic Disease Management and Care Coordination Needs (DM, irritable bowel syndrome,HTN and HLD)   Priority: High     Long-Range Goal: Establish Plan of Care for Chronic Disease Management Needs (DM, irritable bowel syndrome,HTN and HLD)   Start Date: 02/01/2021  Expected End Date: 01/29/2022  Priority: High  Note:   Current Barriers:  Care Coordination needs related to Level of care concerns, ADL IADL limitations, Cognitive Deficits, and Inability to perform IADL's independently Chronic Disease Management support and education needs related to HTN, HLD, DMII, and irritable bowel syndrome  Cognitive Deficits Pt has POA [address] with [place] and [name] [phone] Spoke with pt.  States she has been taking sugar free Metamucil daily.  States she is trying to eat more vegetables and fruit now.  States her bowels move most days and have been doing good recently.  States she is to have a colonoscopy on 07/12/21 at [facility]. States that Linda Thompson will take her to the colonoscopy.  Pt does not need to check her CBG or B/P since she  is well controlled and thinks it would be too complicated for her to do. States she is trying to eat enough vegetables and puts them on her shopping list. States she does walk to get her mail daily but she has not gone to the exercise class in her building yet. States she uses her walker when she gets her mail.  States she has been going to the eye doctor to get shots in her eyes and she has been doing OK with giving herself eye drops after the shots.  Denies any falls or pain.  RNCM Clinical Goal(s):  Patient will verbalize understanding of plan for management of HTN, HLD, DMII, and irritable bowel syndrome  as evidenced by voiced adherence to plan of care verbalize basic understanding of  HTN, HLD, DMII, and irritable bowel syndrome  disease process and self health management plan as evidenced by voiced understanding and teach back take all medications exactly as prescribed and will call provider for medication related questions as evidenced by dispense report and pt verbalization attend all scheduled medical appointments: 06/20/21, CCM LCSW 07/04/21 podiatry 08/02/21 as evidenced by  medical records demonstrate Ongoing adherence to prescribed treatment plan for HTN, HLD, DMII, and irritable bowel syndrome  as evidenced by readings within limits, voiced adherence to plan of care continue to work with RN Care Manager to address care management and care coordination needs related to  HTN, HLD, DMII, and irritable bowel syndrome  as evidenced by adherence to CM Team Scheduled appointments work with Education officer, museum to address  related to the management of Level of care concerns, ADL IADL limitations, Cognitive Deficits, and Inability to perform IADL's independently related to the management of HTN, HLD, DMII, and irritable bowel syndrome  as evidenced by review of EMR and patient or Education officer, museum report through collaboration with Consulting civil engineer, provider, and care team.   Interventions: 1:1 collaboration with  primary care provider regarding development and update of comprehensive plan of care as evidenced by provider attestation and co-signature Inter-disciplinary care team collaboration (see longitudinal plan of care) Evaluation of current treatment plan related to  self management and patient's adherence to plan as established by provider   Diabetes Interventions:  (Status:  Goal on track:  Yes.) Long Term Goal Assessed patient's understanding of A1c goal: <6.5% Provided education to patient about basic DM disease process Reviewed medications with patient and discussed importance of medication adherence Discussed plans with patient for ongoing care management follow up and provided patient with direct contact information for care management team Reviewed to continue to eat more vegetables Reinforced to try to walk more in her building for exercise Lab Results  Component Value Date   HGBA1C 6.1 10/05/2020   Irritable bowel syndrome/constipation   (Status:  Goal on track:  Yes.)  Long Term Goal Evaluation of current treatment plan related to  Irritable bowel syndrome/constipation  , Level of care concerns, ADL IADL limitations, Cognitive Deficits, and Inability to perform IADL's independently self-management and patient's adherence to plan as established by provider. Discussed plans with patient for ongoing care management follow up and provided patient with direct contact information for care management team Evaluation of current treatment plan related to Irritable bowel syndrome/constipation  and patient's adherence to plan as established by provider Provided education to patient re: Irritable bowel syndrome/constipation  Reviewed to follow prep instructions for her upcoming colonoscopy. Reinforced eat foods with more fiber and to drink adequate amounts of fluids  Hyperlipidemia Interventions:  (Status:  Condition stable.  Not addressed this visit.) Long Term Goal Medication review performed;  medication list updated in electronic medical record.  Provider established cholesterol goals reviewed Counseled on importance of regular laboratory monitoring as prescribed Reviewed role and benefits of statin for ASCVD risk reduction Reviewed importance of limiting foods high in cholesterol  Hypertension Interventions:  (Status:  Goal on track:  Yes.) Long Term Goal Last practice recorded BP readings:  BP Readings from Last 3 Encounters:  10/05/20 124/82  06/12/20 (!) 150/80  09/14/19 118/78  Most recent eGFR/CrCl: No results found for: EGFR  No components found for: CRCL  Evaluation of current treatment plan related to hypertension self management and patient's adherence to plan as established by provider Provided education to patient re: stroke prevention, s/s of heart attack and stroke Provided education on prescribed diet low sodium low CHO Reviewed to avoid foods with higher sodium. Reinforced to have B/P checked when nurses visit her building  Patient Goals/Self-Care Activities: Take all medications as prescribed Attend all scheduled provider appointments Call pharmacy for medication refills 3-7 days in advance of running out of medications Call provider  office for new concerns or questions  Work with the social worker to address care coordination needs and will continue to work with the clinical team to address health care and disease management related needs keep appointment with eye doctor check feet daily for cuts, sores or redness drink 6 to 8 glasses of water each day fill half of plate with vegetables keep feet up while sitting wash and dry feet carefully every day choose a place to take my blood pressure (home, clinic or office, retail store) call doctor for signs and symptoms of high blood pressure keep all doctor appointments take medications for blood pressure exactly as prescribed eat more whole grains, fruits and vegetables, lean meats and healthy fats limit  salt intake to $RemoveB'2300mg'vHTHHhtV$ /day call for medicine refill 2 or 3 days before it runs out take all medications exactly as prescribed call doctor with any symptoms you believe are related to your medicine - avoid caffeine - avoid foods that make my symptoms worse - drink at least 6 glasses of fluids a day - eat meals at regular times - limit fizzy (carbonated) drinks - limit fresh fruit to 3 servings per day - read food label for sorbitol and avoid these foods Follow Up Plan:  Telephone follow up appointment with care management team member scheduled for:  08/16/21 The patient has been provided with contact information for the care management team and has been advised to call with any health related questions or concerns.       Plan:Telephone follow up appointment with care management team member scheduled for:  08/16/21 The patient has been provided with contact information for the care management team and has been advised to call with any health related questions or concerns.  Peter Garter RN, Jackquline Denmark, CDE Care Management Coordinator [facility] [phone]

## 2021-06-19 DIAGNOSIS — E1159 Type 2 diabetes mellitus with other circulatory complications: Secondary | ICD-10-CM

## 2021-06-19 DIAGNOSIS — E785 Hyperlipidemia, unspecified: Secondary | ICD-10-CM

## 2021-06-19 DIAGNOSIS — E1169 Type 2 diabetes mellitus with other specified complication: Secondary | ICD-10-CM

## 2021-06-19 DIAGNOSIS — I152 Hypertension secondary to endocrine disorders: Secondary | ICD-10-CM

## 2021-06-19 DIAGNOSIS — E119 Type 2 diabetes mellitus without complications: Secondary | ICD-10-CM

## 2021-06-20 ENCOUNTER — Encounter (INDEPENDENT_AMBULATORY_CARE_PROVIDER_SITE_OTHER): Payer: Medicare Other | Admitting: Ophthalmology

## 2021-06-20 DIAGNOSIS — H43813 Vitreous degeneration, bilateral: Secondary | ICD-10-CM

## 2021-06-20 DIAGNOSIS — I10 Essential (primary) hypertension: Secondary | ICD-10-CM | POA: Diagnosis not present

## 2021-06-20 DIAGNOSIS — H35033 Hypertensive retinopathy, bilateral: Secondary | ICD-10-CM | POA: Diagnosis not present

## 2021-06-20 DIAGNOSIS — H33302 Unspecified retinal break, left eye: Secondary | ICD-10-CM

## 2021-06-20 DIAGNOSIS — H34832 Tributary (branch) retinal vein occlusion, left eye, with macular edema: Secondary | ICD-10-CM | POA: Diagnosis not present

## 2021-07-04 ENCOUNTER — Ambulatory Visit (INDEPENDENT_AMBULATORY_CARE_PROVIDER_SITE_OTHER): Payer: Medicare Other | Admitting: Licensed Clinical Social Worker

## 2021-07-04 DIAGNOSIS — E119 Type 2 diabetes mellitus without complications: Secondary | ICD-10-CM

## 2021-07-04 DIAGNOSIS — E1159 Type 2 diabetes mellitus with other circulatory complications: Secondary | ICD-10-CM

## 2021-07-04 NOTE — Patient Instructions (Signed)
Visit Information  Thank you for taking time to visit with me today. Please don't hesitate to contact me if I can be of assistance to you before our next scheduled telephone appointment.  Following are the goals we discussed today:  Patient Goals/Self-Care Activities: Linda Thompson and Linda Thompson (Healthcare and [place]) will continue to work with LCSW to receive assistance with long-term care placement for you Contact LCSW directly (# [phone]) if you have questions, need assistance, or if additional social work needs are identified.  Our next appointment is by telephone on 08/29/21 at 10:45 AM  Please call the care guide team at [phone] if you need to cancel or reschedule your appointment.   If you are experiencing a Mental Health or [place] or need someone to talk to, please call 911   The patient verbalized understanding of instructions, educational materials, and care plan provided today and DECLINED offer to receive copy of patient instructions, educational materials, and care plan.   Christa See, MSW, [name] Phone [phone] 10:09 AM

## 2021-07-04 NOTE — Chronic Care Management (AMB) (Signed)
Chronic Care Management    Clinical Social Work Note  07/04/2021 Name: Linda Thompson MRN: [id number] DOB: May 29, 1955  Linda Thompson is a 66 y.o. year old female who is a primary care patient of Salomon Fick, Bettey Mare, MD. The CCM team was consulted to assist the patient with chronic disease management and/or care coordination needs related to: Mental Health Counseling and Resources.   Engaged with patient by telephone for follow up visit in response to provider referral for social work chronic care management and care coordination services.   Consent to Services:  The patient was given information about Chronic Care Management services, agreed to services, and gave verbal consent prior to initiation of services.  Please see initial visit note for detailed documentation.   Patient agreed to services and consent obtained.   Assessment: Review of patient past medical history, allergies, medications, and health status, including review of relevant consultants reports was performed today as part of a comprehensive evaluation and provision of chronic care management and care coordination services.     SDOH (Social Determinants of Health) assessments and interventions performed:    Advanced Directives Status: Not addressed in this encounter.  CCM Care Plan  Allergies  Allergen Reactions   Ace Inhibitors Cough   Amoxicillin-Pot Clavulanate Diarrhea   Augmentin [Amoxicillin-Pot Clavulanate] Other (See Comments)    diarrhea   Azithromycin Diarrhea   Ceftin [Cefuroxime Axetil]     diarrhea   Cefuroxime Diarrhea and Other (See Comments)    Generalized weakness   Cyclobenzaprine Hcl Diarrhea   Flexeril [Cyclobenzaprine] Diarrhea   Flonase [Fluticasone Propionate] Other (See Comments)    Nose bleed   Ibuprofen Nausea And Vomiting    Outpatient Encounter Medications as of 07/04/2021  Medication Sig   acetaminophen (TYLENOL) 500 MG tablet Take 500 mg by mouth every 6 (six) hours as needed for  mild pain.    B-12 MICROLOZENGE 500 MCG SUBL Place under the tongue.   Besifloxacin HCl (BESIVANCE) 0.6 % SUSP Apply to eye. 4 times each day for 2 days   [facility]S B-12 500 MCG tablet TAKE 1 TABLET BY MOUTH DAILY   diclofenac sodium (VOLTAREN) 1 % GEL APPLY 2 GRAMS TO AFFECTED AREA 4 TIMES A DAY (Patient taking differently: Apply 2 g topically 4 (four) times daily.)   diltiazem (CARDIZEM CD) 240 MG 24 hr capsule Take 1 capsule (240 mg total) by mouth daily.   dorzolamide-timolol (COSOPT) 22.3-6.8 MG/ML ophthalmic solution SMARTSIG:In Eye(s)   ferrous sulfate 325 (65 FE) MG tablet Take 325 mg by mouth 3 (three) times daily with meals.    fluticasone (FLONASE) 50 MCG/ACT nasal spray IINSTILL 2 SPRAYS INTO EACH NOSTRIL AT BEDTIME   hydrochlorothiazide (MICROZIDE) 12.5 MG capsule TAKE 1 CAPSULE BY MOUTH EVERY DAY   hydrocortisone 2.5 % ointment APPLY TO AFFECTED AREA TWICE DAILY MONDAY THRU FRIDAY ONLY   ipratropium (ATROVENT) 0.06 % nasal spray INHALE 1 SPRAY INTO BOTH NOSTRILS 4 TIMES DAILY   ketoconazole (NIZORAL) 2 % cream SMARTSIG:1 Application Topical 1 to 2 Times Daily   ketoconazole (NIZORAL) 2 % shampoo USE AS DIRECTED TWICE A WEEK AS DIRECTED   latanoprost (XALATAN) 0.005 % ophthalmic solution Place 1 drop into both eyes at bedtime.    loratadine (CLARITIN) 10 MG tablet Take 1 tablet (10 mg total) by mouth daily.   lovastatin (MEVACOR) 10 MG tablet Take 2 tablets (20 mg total) by mouth at bedtime.   metFORMIN (GLUCOPHAGE) 1000 MG tablet TAKE 1 TABLET TWICE A DAY  WITH A MEAL   mometasone (ELOCON) 0.1 % ointment APPLY A SMALL AMOUNT TO AFFECTED AREA AS DIRECTED   pantoprazole (PROTONIX) 40 MG tablet TAKE 1 TABLET BY MOUTH EVERY DAY   Vitamin D, Cholecalciferol, 25 MCG (1000 UT) CAPS Take 1,000 Units by mouth daily.   Vitamins-Lipotropics (CVS INNER EAR PLUS PO) Take 2 tablets by mouth 2 (two) times daily.    [DISCONTINUED] diltiazem (TIAZAC) 240 MG 24 hr capsule Take 1 capsule (240 mg total) by  mouth daily.   No facility-administered encounter medications on file as of 07/04/2021.    Patient Active Problem List   Diagnosis Date Noted   Osteoporosis 12/11/2020   Pain due to onychomycosis of toenails of both feet 07/07/2018   Diabetes mellitus without complication ([facility]) 07/07/2018   Murmur 02/17/2018   Hypertension associated with diabetes ([facility]) 04/20/2017   Hyperlipidemia associated with type 2 diabetes mellitus ([facility]) 04/20/2017   Arthritis 12/03/2015   Multiple gastric polyps 08/30/2015   Sarcoidosis 10/27/2014   Morbid obesity ([facility]) 05/05/2011   Irritable bowel syndrome 03/05/2010   Diabetes ([facility]) 08/13/2007   Allergic rhinitis 09/23/2006   Moderate intellectual disabilities 03/11/2006   Unspecified glaucoma 03/11/2006   Gastroesophageal reflux disease 03/11/2006    Conditions to be addressed/monitored: HTN, DMII, and Osteoporosis  Care Plan : LCSW Plan of Care  Updates made by Bridgett Larsson, LCSW since 07/04/2021 12:00 AM     Problem: Maintain My Quality of Life.   Priority: High     Long-Range Goal: Maintain My Quality of Life.   Start Date: 11/08/2020  Expected End Date: 08/19/2021  This Visit's Progress: On track  Recent Progress: On track  Priority: High  Note:   Current Barriers:  Patient with Hypertension, Diabetes Mellitus Type II, Morbid Obesity, Moderate Intellectual Disabilities, Hyperlipidemia and Glaucoma needs Resources, Referrals, Education, Advocacy and Case Management Services to meet unmet needs in the home. Clinical Goal(s):  LCSW will work with Patient, [name] and [name] (Healthcare and Geophysical data processor) to identify and address any acute and/or chronic care coordination needs related to self-health management.  LCSW Interventions:  Inter-disciplinary care team collaboration (see longitudinal plan of care) Patient reports she is doing well managing health conditions. Patient successfully reviewed upcoming  appointments with LCSW, stating she maintains her appt cards and receives transportation from [name] Patient utilizes facility's transportation services to run errands Patient reports no concerns with current residence. States she feels supported Collaboration with Primary Care Physician, Dr. Abbe Amsterdam regarding development and update of comprehensive plan of care as evidenced by provider attestation and co-signature. Solution-Focused Strategies implemented, Chief Strategy Officer utilized and Doctor, general practice provided, when [employer] and [name] voiced concerns about patient requiring a higher level of care, within the next 6-12 months.   Patient Goals/Self-Care Activities: [name] and [name] (Dispensing optician) will continue to work with LCSW to receive assistance with long-term care placement for you Contact LCSW directly (# [phone]) if you have questions, need assistance, or if additional social work needs are identified.        Follow Up Plan: SW will follow up with patient by phone over the next 8 weeks      Jenel Lucks, MSW, LCSW [facility] [place]  [facility] [email or web address] Phone [phone] 10:08 AM

## 2021-07-12 ENCOUNTER — Other Ambulatory Visit: Payer: Self-pay | Admitting: Family Medicine

## 2021-07-12 DIAGNOSIS — Z1211 Encounter for screening for malignant neoplasm of colon: Secondary | ICD-10-CM | POA: Diagnosis not present

## 2021-07-12 DIAGNOSIS — K317 Polyp of stomach and duodenum: Secondary | ICD-10-CM | POA: Diagnosis not present

## 2021-07-12 DIAGNOSIS — K5730 Diverticulosis of large intestine without perforation or abscess without bleeding: Secondary | ICD-10-CM | POA: Diagnosis not present

## 2021-07-12 DIAGNOSIS — E119 Type 2 diabetes mellitus without complications: Secondary | ICD-10-CM | POA: Diagnosis not present

## 2021-07-12 DIAGNOSIS — E1169 Type 2 diabetes mellitus with other specified complication: Secondary | ICD-10-CM

## 2021-07-12 DIAGNOSIS — Z8719 Personal history of other diseases of the digestive system: Secondary | ICD-10-CM | POA: Diagnosis not present

## 2021-07-12 DIAGNOSIS — J338 Other polyp of sinus: Secondary | ICD-10-CM | POA: Diagnosis not present

## 2021-07-12 DIAGNOSIS — Z86010 Personal history of colonic polyps: Secondary | ICD-10-CM | POA: Diagnosis not present

## 2021-07-12 LAB — HM COLONOSCOPY

## 2021-07-15 ENCOUNTER — Encounter: Payer: Self-pay | Admitting: Family Medicine

## 2021-07-24 ENCOUNTER — Ambulatory Visit: Payer: Self-pay

## 2021-07-24 DIAGNOSIS — E1159 Type 2 diabetes mellitus with other circulatory complications: Secondary | ICD-10-CM

## 2021-07-24 DIAGNOSIS — E119 Type 2 diabetes mellitus without complications: Secondary | ICD-10-CM

## 2021-07-24 NOTE — Chronic Care Management (AMB) (Signed)
Chronic Care Management   CCM RN Visit Note  07/24/2021 Name: Linda Thompson MRN: [id number] DOB: 06-29-1955  Subjective: Linda Thompson is a 66 y.o. year old female who is a primary care patient of [name], MD. The care management team was consulted for assistance with disease management and care coordination needs.    Case closed goals met  for  Case closed goals met  in response to provider referral for case management and/or care coordination services.   Consent to Services:  The patient was given information about Chronic Care Management services, agreed to services, and gave verbal consent prior to initiation of services.  Please see initial visit note for detailed documentation.   Patient agreed to services and verbal consent obtained.   Assessment: Review of patient past medical history, allergies, medications, health status, including review of consultants reports, laboratory and other test data, was performed as part of comprehensive evaluation and provision of chronic care management services.   SDOH (Social Determinants of Health) assessments and interventions performed:    CCM Care Plan  Allergies  Allergen Reactions   Ace Inhibitors Cough   Amoxicillin-Pot Clavulanate Diarrhea   Augmentin [Amoxicillin-Pot Clavulanate] Other (See Comments)    diarrhea   Azithromycin Diarrhea   Ceftin [Cefuroxime Axetil]     diarrhea   Cefuroxime Diarrhea and Other (See Comments)    Generalized weakness   Cyclobenzaprine Hcl Diarrhea   Flexeril [Cyclobenzaprine] Diarrhea   Flonase [Fluticasone Propionate] Other (See Comments)    Nose bleed   Ibuprofen Nausea And Vomiting    Outpatient Encounter Medications as of 07/24/2021  Medication Sig   acetaminophen (TYLENOL) 500 MG tablet Take 500 mg by mouth every 6 (six) hours as needed for mild pain.    B-12 MICROLOZENGE 500 MCG SUBL Place under the tongue.   Besifloxacin HCl (BESIVANCE) 0.6 % SUSP Apply to eye. 4 times each  day for 2 days   [facility] B-12 500 MCG tablet TAKE 1 TABLET BY MOUTH DAILY   diclofenac sodium (VOLTAREN) 1 % GEL APPLY 2 GRAMS TO AFFECTED AREA 4 TIMES A DAY (Patient taking differently: Apply 2 g topically 4 (four) times daily.)   diltiazem (CARDIZEM CD) 240 MG 24 hr capsule Take 1 capsule (240 mg total) by mouth daily.   dorzolamide-timolol (COSOPT) 22.3-6.8 MG/ML ophthalmic solution SMARTSIG:In Eye(s)   ferrous sulfate 325 (65 FE) MG tablet Take 325 mg by mouth 3 (three) times daily with meals.    fluticasone (FLONASE) 50 MCG/ACT nasal spray IINSTILL 2 SPRAYS INTO EACH NOSTRIL AT BEDTIME   hydrochlorothiazide (MICROZIDE) 12.5 MG capsule TAKE 1 CAPSULE BY MOUTH EVERY DAY   hydrocortisone 2.5 % ointment APPLY TO AFFECTED AREA TWICE DAILY MONDAY THRU FRIDAY ONLY   ipratropium (ATROVENT) 0.06 % nasal spray INHALE 1 SPRAY INTO BOTH NOSTRILS 4 TIMES DAILY   ketoconazole (NIZORAL) 2 % cream SMARTSIG:1 Application Topical 1 to 2 Times Daily   ketoconazole (NIZORAL) 2 % shampoo USE AS DIRECTED TWICE A WEEK AS DIRECTED   latanoprost (XALATAN) 0.005 % ophthalmic solution Place 1 drop into both eyes at bedtime.    loratadine (CLARITIN) 10 MG tablet Take 1 tablet (10 mg total) by mouth daily.   lovastatin (MEVACOR) 10 MG tablet TAKE 2 TABLETS BY MOUTH AT BEDTIME.   metFORMIN (GLUCOPHAGE) 1000 MG tablet TAKE 1 TABLET TWICE A DAY WITH A MEAL   mometasone (ELOCON) 0.1 % ointment APPLY A SMALL AMOUNT TO AFFECTED AREA AS DIRECTED   pantoprazole (PROTONIX)  40 MG tablet TAKE 1 TABLET BY MOUTH EVERY DAY   Vitamin D, Cholecalciferol, 25 MCG (1000 UT) CAPS Take 1,000 Units by mouth daily.   Vitamins-Lipotropics ([facility] INNER EAR PLUS PO) Take 2 tablets by mouth 2 (two) times daily.    [DISCONTINUED] diltiazem (TIAZAC) 240 MG 24 hr capsule Take 1 capsule (240 mg total) by mouth daily.   No facility-administered encounter medications on file as of 07/24/2021.    Patient Active Problem List   Diagnosis Date Noted    Osteoporosis 12/11/2020   Pain due to onychomycosis of toenails of both feet 07/07/2018   Diabetes mellitus without complication ([place]) 34/91/7915   Murmur 02/17/2018   Hypertension associated with diabetes ([place]) 04/20/2017   Hyperlipidemia associated with type 2 diabetes mellitus ([place]) 04/20/2017   Arthritis 12/03/2015   Multiple gastric polyps 08/30/2015   Sarcoidosis 10/27/2014   Morbid obesity ([place]) 05/05/2011   Irritable bowel syndrome 03/05/2010   Diabetes ([place]) 08/13/2007   Allergic rhinitis 09/23/2006   Moderate intellectual disabilities 03/11/2006   Unspecified glaucoma 03/11/2006   Gastroesophageal reflux disease 03/11/2006    Conditions to be addressed/monitored:HTN and DMII  Care Plan : RN Care Manager Plan of Care  Updates made by Dimitri Ped, RN since 07/24/2021 12:00 AM  Completed 07/24/2021   Problem: Chronic Disease Management and Care Coordination Needs (DM, irritable bowel syndrome,HTN and HLD) Resolved 07/24/2021  Priority: High     Long-Range Goal: Establish Plan of Care for Chronic Disease Management Needs (DM, irritable bowel syndrome,HTN and HLD) Completed 07/24/2021  Start Date: 02/01/2021  Expected End Date: 01/29/2022  Priority: High  Note:   Case closed goals met Current Barriers:  Care Coordination needs related to Level of care concerns, ADL IADL limitations, Cognitive Deficits, and Inability to perform IADL's independently Chronic Disease Management support and education needs related to HTN, HLD, DMII, and irritable bowel syndrome  Cognitive Deficits Pt has POA [address] with [place] and [name] [phone] Spoke with pt.  States she has been taking sugar free Metamucil daily.  States she is trying to eat more vegetables and fruit now.  States her bowels move most days and have been doing good recently.  States she is to have a colonoscopy on 07/12/21 at [facility]. States that Mateo Flow will take her to the colonoscopy.  Pt does not need to  check her CBG or B/P since she is well controlled and thinks it would be too complicated for her to do. States she is trying to eat enough vegetables and puts them on her shopping list. States she does walk to get her mail daily but she has not gone to the exercise class in her building yet. States she uses her walker when she gets her mail.  States she has been going to the eye doctor to get shots in her eyes and she has been doing OK with giving herself eye drops after the shots.  Denies any falls or pain.  RNCM Clinical Goal(s):  Patient will verbalize understanding of plan for management of HTN, HLD, DMII, and irritable bowel syndrome  as evidenced by voiced adherence to plan of care verbalize basic understanding of  HTN, HLD, DMII, and irritable bowel syndrome  disease process and self health management plan as evidenced by voiced understanding and teach back take all medications exactly as prescribed and will call provider for medication related questions as evidenced by dispense report and pt verbalization attend all scheduled medical appointments: 06/20/21, CCM LCSW 07/04/21 podiatry 08/02/21  as evidenced by medical records demonstrate Ongoing adherence to prescribed treatment plan for HTN, HLD, DMII, and irritable bowel syndrome  as evidenced by readings within limits, voiced adherence to plan of care continue to work with RN Care Manager to address care management and care coordination needs related to  HTN, HLD, DMII, and irritable bowel syndrome  as evidenced by adherence to CM Team Scheduled appointments work with Education officer, museum to address  related to the management of Level of care concerns, ADL IADL limitations, Cognitive Deficits, and Inability to perform IADL's independently related to the management of HTN, HLD, DMII, and irritable bowel syndrome  as evidenced by review of EMR and patient or Education officer, museum report through collaboration with Consulting civil engineer, provider, and care team.    Interventions: 1:1 collaboration with primary care provider regarding development and update of comprehensive plan of care as evidenced by provider attestation and co-signature Inter-disciplinary care team collaboration (see longitudinal plan of care) Evaluation of current treatment plan related to  self management and patient's adherence to plan as established by provider   Diabetes Interventions:  (Status:  Goal Met.) Long Term Goal Assessed patient's understanding of A1c goal: <6.5% Provided education to patient about basic DM disease process Reviewed medications with patient and discussed importance of medication adherence Discussed plans with patient for ongoing care management follow up and provided patient with direct contact information for care management team Reviewed to continue to eat more vegetables Reinforced to try to walk more in her building for exercise Lab Results  Component Value Date   HGBA1C 6.1 10/05/2020   Irritable bowel syndrome/constipation   (Status:  Goal Met.)  Long Term Goal Evaluation of current treatment plan related to  Irritable bowel syndrome/constipation  , Level of care concerns, ADL IADL limitations, Cognitive Deficits, and Inability to perform IADL's independently self-management and patient's adherence to plan as established by provider. Discussed plans with patient for ongoing care management follow up and provided patient with direct contact information for care management team Evaluation of current treatment plan related to Irritable bowel syndrome/constipation  and patient's adherence to plan as established by provider Provided education to patient re: Irritable bowel syndrome/constipation  Reviewed to follow prep instructions for her upcoming colonoscopy. Reinforced eat foods with more fiber and to drink adequate amounts of fluids  Hyperlipidemia Interventions:  (Status:  Goal Met.) Long Term Goal Medication review performed; medication list  updated in electronic medical record.  Provider established cholesterol goals reviewed Counseled on importance of regular laboratory monitoring as prescribed Reviewed role and benefits of statin for ASCVD risk reduction Reviewed importance of limiting foods high in cholesterol  Hypertension Interventions:  (Status:  Goal Met.) Long Term Goal Last practice recorded BP readings:  BP Readings from Last 3 Encounters:  10/05/20 124/82  06/12/20 (!) 150/80  09/14/19 118/78  Most recent eGFR/CrCl: No results found for: EGFR  No components found for: CRCL  Evaluation of current treatment plan related to hypertension self management and patient's adherence to plan as established by provider Provided education to patient re: stroke prevention, s/s of heart attack and stroke Provided education on prescribed diet low sodium low CHO Reviewed to avoid foods with higher sodium. Reinforced to have B/P checked when nurses visit her building  Patient Goals/Self-Care Activities: Take all medications as prescribed Attend all scheduled provider appointments Call pharmacy for medication refills 3-7 days in advance of running out of medications Call provider office for new concerns or questions  Work with the social  worker to address care coordination needs and will continue to work with the clinical team to address health care and disease management related needs keep appointment with eye doctor check feet daily for cuts, sores or redness drink 6 to 8 glasses of water each day fill half of plate with vegetables keep feet up while sitting wash and dry feet carefully every day choose a place to take my blood pressure (home, clinic or office, retail store) call doctor for signs and symptoms of high blood pressure keep all doctor appointments take medications for blood pressure exactly as prescribed eat more whole grains, fruits and vegetables, lean meats and healthy fats limit salt intake to  $R'2300mg'By$ /day call for medicine refill 2 or 3 days before it runs out take all medications exactly as prescribed call doctor with any symptoms you believe are related to your medicine - avoid caffeine - avoid foods that make my symptoms worse - drink at least 6 glasses of fluids a day - eat meals at regular times - limit fizzy (carbonated) drinks - limit fresh fruit to 3 servings per day - read food label for sorbitol and avoid these foods Follow Up Plan:  The patient has been provided with contact information for the care management team and has been advised to call with any health related questions or concerns.  No further follow up required: Case closed goals met       Plan:The patient has been provided with contact information for the care management team and has been advised to call with any health related questions or concerns.  No further follow up required: Case closed goals met Peter Garter RN, [facility], CDE Care Management Coordinator [facility] [phone]

## 2021-07-24 NOTE — Patient Instructions (Signed)
Visit Information Case closed goals met Thank you for allowing me to share the care management and care coordination services that are available to you as part of your health plan and services through your primary care provider and medical home. Please reach out to me at [phone] if the care management/care coordination team may be of assistance to you in the future.   Nysir Fergusson RN, BSN,CCM, CDE Care Management Coordinator [facility] [phone]   

## 2021-07-26 ENCOUNTER — Encounter (INDEPENDENT_AMBULATORY_CARE_PROVIDER_SITE_OTHER): Payer: Medicare Other | Admitting: Ophthalmology

## 2021-07-26 DIAGNOSIS — H34832 Tributary (branch) retinal vein occlusion, left eye, with macular edema: Secondary | ICD-10-CM | POA: Diagnosis not present

## 2021-07-26 DIAGNOSIS — H33302 Unspecified retinal break, left eye: Secondary | ICD-10-CM | POA: Diagnosis not present

## 2021-07-26 DIAGNOSIS — H43813 Vitreous degeneration, bilateral: Secondary | ICD-10-CM

## 2021-07-26 DIAGNOSIS — H35033 Hypertensive retinopathy, bilateral: Secondary | ICD-10-CM | POA: Diagnosis not present

## 2021-07-26 DIAGNOSIS — I10 Essential (primary) hypertension: Secondary | ICD-10-CM

## 2021-08-02 ENCOUNTER — Ambulatory Visit (INDEPENDENT_AMBULATORY_CARE_PROVIDER_SITE_OTHER): Payer: Medicare Other | Admitting: Podiatry

## 2021-08-02 ENCOUNTER — Encounter: Payer: Self-pay | Admitting: Podiatry

## 2021-08-02 DIAGNOSIS — E119 Type 2 diabetes mellitus without complications: Secondary | ICD-10-CM

## 2021-08-02 DIAGNOSIS — M79675 Pain in left toe(s): Secondary | ICD-10-CM

## 2021-08-02 DIAGNOSIS — M2041 Other hammer toe(s) (acquired), right foot: Secondary | ICD-10-CM

## 2021-08-02 DIAGNOSIS — M2042 Other hammer toe(s) (acquired), left foot: Secondary | ICD-10-CM

## 2021-08-02 DIAGNOSIS — M79674 Pain in right toe(s): Secondary | ICD-10-CM

## 2021-08-02 DIAGNOSIS — B351 Tinea unguium: Secondary | ICD-10-CM | POA: Diagnosis not present

## 2021-08-02 NOTE — Progress Notes (Signed)

## 2021-08-08 ENCOUNTER — Encounter: Payer: Self-pay | Admitting: Family Medicine

## 2021-08-16 ENCOUNTER — Encounter: Payer: Self-pay | Admitting: Family Medicine

## 2021-08-16 ENCOUNTER — Telehealth: Payer: Medicare Other

## 2021-08-29 ENCOUNTER — Telehealth: Payer: Medicare Other

## 2021-08-29 DIAGNOSIS — E113212 Type 2 diabetes mellitus with mild nonproliferative diabetic retinopathy with macular edema, left eye: Secondary | ICD-10-CM | POA: Diagnosis not present

## 2021-08-29 DIAGNOSIS — Z961 Presence of intraocular lens: Secondary | ICD-10-CM | POA: Diagnosis not present

## 2021-08-29 DIAGNOSIS — H401131 Primary open-angle glaucoma, bilateral, mild stage: Secondary | ICD-10-CM | POA: Diagnosis not present

## 2021-08-29 DIAGNOSIS — H04123 Dry eye syndrome of bilateral lacrimal glands: Secondary | ICD-10-CM | POA: Diagnosis not present

## 2021-08-29 LAB — HM DIABETES EYE EXAM

## 2021-09-02 ENCOUNTER — Encounter: Payer: Self-pay | Admitting: Family Medicine

## 2021-09-05 ENCOUNTER — Encounter (INDEPENDENT_AMBULATORY_CARE_PROVIDER_SITE_OTHER): Payer: Medicare Other | Admitting: Ophthalmology

## 2021-09-05 DIAGNOSIS — H35033 Hypertensive retinopathy, bilateral: Secondary | ICD-10-CM | POA: Diagnosis not present

## 2021-09-05 DIAGNOSIS — I10 Essential (primary) hypertension: Secondary | ICD-10-CM

## 2021-09-05 DIAGNOSIS — H34832 Tributary (branch) retinal vein occlusion, left eye, with macular edema: Secondary | ICD-10-CM

## 2021-09-05 DIAGNOSIS — H43813 Vitreous degeneration, bilateral: Secondary | ICD-10-CM | POA: Diagnosis not present

## 2021-09-11 ENCOUNTER — Ambulatory Visit (INDEPENDENT_AMBULATORY_CARE_PROVIDER_SITE_OTHER): Payer: Medicare Other

## 2021-09-11 VITALS — BP 128/62 | HR 81 | Temp 97.9°F | Ht 62.0 in | Wt 171.0 lb

## 2021-09-11 DIAGNOSIS — Z0000 Encounter for general adult medical examination without abnormal findings: Secondary | ICD-10-CM | POA: Diagnosis not present

## 2021-09-11 NOTE — Progress Notes (Addendum)
Subjective:   Linda Thompson is a 66 y.o. female who presents for Medicare Annual (Subsequent) preventive examination.  Review of Systems     Cardiac Risk Factors include: advanced age (>36mn, >>62women);hypertension;diabetes mellitus     Objective:    Today's Vitals   09/11/21 0952  BP: 128/62  Pulse: 81  Temp: 97.9 F (36.6 C)  TempSrc: Oral  SpO2: 96%  Weight: 171 lb (77.6 kg)  Height: '5\' 2"'$  (1.575 m)   Body mass index is 31.28 kg/m.     09/11/2021   10:19 AM 11/08/2020    4:51 PM 10/26/2020    1:15 PM 09/07/2020   10:43 AM 09/07/2019   11:28 AM 12/22/2017    9:13 AM 11/29/2017    8:05 AM  Advanced Directives  Does Patient Have a Medical Advance Directive? Yes Yes Yes Yes Yes Yes Yes  Type of AParamedicof ABliss CornerLiving will [place]Living will HGreat [place]Living will [place]Living will Living will  Living will;Healthcare Power of Attorney  Does patient want to make changes to medical advance directive? No - Patient declined No - Guardian declined   No - Patient declined  No - Patient declined  Copy of [place] Chart? Yes - validated most recent copy scanned in chart (See row information) Yes - validated most recent copy scanned in chart (See row information) Yes - validated most recent copy scanned in chart (See row information) Yes - validated most recent copy scanned in chart (See row information)   No - copy requested    Current Medications (verified) Outpatient Encounter Medications as of 09/11/2021  Medication Sig   acetaminophen (TYLENOL) 500 MG tablet Take 500 mg by mouth every 6 (six) hours as needed for mild pain.    B-12 MICROLOZENGE 500 MCG SUBL Place under the tongue.   Besifloxacin HCl (BESIVANCE) 0.6 % SUSP Apply to eye. 4 times each day for 2 days   [facility] B-12 500 MCG tablet TAKE 1 TABLET BY MOUTH DAILY   diclofenac sodium (VOLTAREN) 1 % GEL  APPLY 2 GRAMS TO AFFECTED AREA 4 TIMES A DAY (Patient taking differently: Apply 2 g topically 4 (four) times daily.)   diltiazem (CARDIZEM CD) 240 MG 24 hr capsule Take 1 capsule (240 mg total) by mouth daily.   dorzolamide-timolol (COSOPT) 22.3-6.8 MG/ML ophthalmic solution SMARTSIG:In Eye(s)   ferrous sulfate 325 (65 FE) MG tablet Take 325 mg by mouth 3 (three) times daily with meals.    fluticasone (FLONASE) 50 MCG/ACT nasal spray IINSTILL 2 SPRAYS INTO EACH NOSTRIL AT BEDTIME   hydrochlorothiazide (MICROZIDE) 12.5 MG capsule TAKE 1 CAPSULE BY MOUTH EVERY DAY   hydrocortisone 2.5 % ointment APPLY TO AFFECTED AREA TWICE DAILY MONDAY THRU FRIDAY ONLY   ipratropium (ATROVENT) 0.06 % nasal spray INHALE 1 SPRAY INTO BOTH NOSTRILS 4 TIMES DAILY   ketoconazole (NIZORAL) 2 % cream SMARTSIG:1 Application Topical 1 to 2 Times Daily   ketoconazole (NIZORAL) 2 % shampoo USE AS DIRECTED TWICE A WEEK AS DIRECTED   latanoprost (XALATAN) 0.005 % ophthalmic solution Place 1 drop into both eyes at bedtime.    loratadine (CLARITIN) 10 MG tablet Take 1 tablet (10 mg total) by mouth daily.   lovastatin (MEVACOR) 10 MG tablet TAKE 2 TABLETS BY MOUTH AT BEDTIME.   metFORMIN (GLUCOPHAGE) 1000 MG tablet TAKE 1 TABLET TWICE A DAY WITH A MEAL   mometasone (ELOCON) 0.1 % ointment APPLY  A SMALL AMOUNT TO AFFECTED AREA AS DIRECTED   pantoprazole (PROTONIX) 40 MG tablet TAKE 1 TABLET BY MOUTH EVERY DAY   Vitamin D, Cholecalciferol, 25 MCG (1000 UT) CAPS Take 1,000 Units by mouth daily.   Vitamins-Lipotropics ([facility] INNER EAR PLUS PO) Take 2 tablets by mouth 2 (two) times daily.    [DISCONTINUED] diltiazem (TIAZAC) 240 MG 24 hr capsule Take 1 capsule (240 mg total) by mouth daily.   No facility-administered encounter medications on file as of 09/11/2021.    Allergies (verified) Ace inhibitors, Amoxicillin-pot clavulanate, Augmentin [amoxicillin-pot clavulanate], Azithromycin, Ceftin [cefuroxime axetil], Cefuroxime,  Cyclobenzaprine hcl, Flexeril [cyclobenzaprine], Flonase [fluticasone propionate], and Ibuprofen   History: Past Medical History:  Diagnosis Date   Allergic rhinitis    Allergy    Anemia    Chronic low back pain    DDD (degenerative disc disease), lumbar    GERD (gastroesophageal reflux disease)    w/ hx diaphragmatic hernia   Glaucoma    GLAUCOMA NOS 03/11/2006   Qualifier: Diagnosis of  By: Diona Browner MD, Amy     Heart murmur    Hx of fracture    R arm, foot   Hyperlipemia    Hypertension    IBS (irritable bowel syndrome)    Low back pain 11/22/2012   Meningioma 12/20/2001   Repeat MR from 2017 - radiology feels not a meningioma, but rather "Thickening of the anterior falx appears to be related to degenerative ossification and not meningioma." HCPOA opted not to do repeat imaging with CT for the possible hemangioma and declined further evaluation of this.   Meningioma ([place])    ant falx, stable on prior imaging   Moderate intellectual disabilities    Murmur    Obesity    Osteoarthrosis, unspecified whether generalized or localized, ankle and foot    Sarcoidosis    difuse bony lesions and LDA, dx by biopsy in 2016   Transaminitis    Type II or unspecified type diabetes mellitus without mention of complication, not stated as uncontrolled    Past Surgical History:  Procedure Laterality Date   CATARACT EXTRACTION Bilateral    Dr Katy Fitch   COLONOSCOPY  04/15/2011   ELBOW SURGERY     right elbow   LIPOMA EXCISION Left [id number]   posterior left axilla   TOTAL ABDOMINAL HYSTERECTOMY  03/05/1992   leiomyoma and cellular leiomyoma   Family History  Problem Relation Age of Onset   Diabetes Mother    Stroke Mother    Hypertension Mother    Stroke Father    Diabetes Maternal Aunt    Heart disease Maternal Aunt         (had pacemaker)   Diabetes Maternal Grandmother    Stroke Maternal Grandmother    Hypertension Maternal Grandmother    Colon cancer Neg Hx    Social History    Socioeconomic History   Marital status: Single    Spouse name: Not on file   Number of children: 0   Years of education: 3rd   Highest education level: 3rd grade  Occupational History   Occupation: Disabled (from arm fracture)  Tobacco Use   Smoking status: Never    Passive exposure: Never   Smokeless tobacco: Never  Vaping Use   Vaping Use: Never used  Substance and Sexual Activity   Alcohol use: No    Alcohol/week: 0.0 standard drinks of alcohol   Drug use: No   Sexual activity: Never    Birth control/protection: Surgical  Comment: Hyst--TAH--unsure if has ovaries  Other Topics Concern   Not on file  Social History Narrative         Currently-disability for arm fracture      Mental Retardation, but is able to drive to places she is comfortable with and prepare her own meals and manage most of her own affairs.      [name] (neighbor) is POA/HCPOA      Reports she gets regular exercise and tries to eat healthy   Social Determinants of Health   Financial Resource Strain: Low Risk  (09/11/2021)   Overall Financial Resource Strain (CARDIA)    Difficulty of Paying Living Expenses: Not hard at all  Food Insecurity: No Food Insecurity (09/11/2021)   Hunger Vital Sign    Worried About Running Out of Food in the Last Year: Never true    Ran Out of Food in the Last Year: Never true  Transportation Needs: No Transportation Needs (09/11/2021)   PRAPARE - Hydrologist (Medical): No    Lack of Transportation (Non-Medical): No  Physical Activity: Inactive (09/11/2021)   Exercise Vital Sign    Days of Exercise per Week: 0 days    Minutes of Exercise per Session: 0 min  Stress: No Stress Concern Present (09/11/2021)   [place]    Feeling of Stress : Not at all  Social Connections: Moderately Integrated (09/11/2021)   Social Connection and Isolation Panel [NHANES]    Frequency of  Communication with Friends and Family: More than three times a week    Frequency of Social Gatherings with Friends and Family: More than three times a week    Attends Religious Services: More than 4 times per year    Active Member of [employer] or Organizations: Yes    Attends Archivist Meetings: More than 4 times per year    Marital Status: Never married      Clinical Intake: How often do you need to have someone help you when you read instructions, pamphlets, or other written materials from your doctor or pharmacy?: 1 - NeverNutrition Risk Assessment:  Has the patient had any N/V/D within the last 2 months?  No  Does the patient have any non-healing wounds?  No  Has the patient had any unintentional weight loss or weight gain?  No   Diabetes:  Is the patient diabetic?  Yes  If diabetic, was a CBG obtained today?  No  Did the patient bring in their glucometer from home?  No  How often do you monitor your CBG's? N/A.   Financial Strains and Diabetes Management:    Diabetic Exams:  Diabetic Eye Exam: Completed No. Overdue for diabetic eye exam. Pt has been advised about the importance in completing this exam. A referral has been placed today. Message sent to referral coordinator for scheduling purposes. Advised pt to expect a call from office referred to regarding appt.  Diabetic Foot Exam: Completed No. Pt has been advised about the importance in completing this exam. Pt is scheduled for diabetic foot exam on Followed by PCP.    Diabetic?  Yes  Activities of Daily Living    09/11/2021   10:10 AM 11/08/2020    4:49 PM  In your present state of health, do you have any difficulty performing the following activities:  Hearing? 0 0  Vision? 0 0  Difficulty concentrating or making decisions? 0 0  Walking or climbing  stairs? 1 0  Comment Uses walker   Dressing or bathing? 0 0  Doing errands, shopping? 0 0  Comment Forensic psychologist and eating ? N N   Using the Toilet? N N  In the past six months, have you accidently leaked urine? N N  Do you have problems with loss of bowel control? N N  Managing your Medications? N N  Managing your Finances? N Y  Comment  Has Dual POA's.  Housekeeping or managing your Housekeeping? N N    Patient Care Team: [name], MD as PCP - General (Family Medicine)  Indicate any recent Medical Services you may have received from other than [facility] providers in the past year (date may be approximate).     Assessment:   This is a routine wellness examination for [place].  Hearing/Vision screen Hearing Screening - Comments:: No hearing difficulty Vision Screening - Comments:: Wears glasses. Followed by Dr Schuyler Amor  Dietary issues and exercise activities discussed: Exercise limited by: orthopedic condition(s) (Dx Arthritis Followed by orthopedic)   Goals Addressed               This Visit's Progress     No current goals (pt-stated)         Depression Screen    09/11/2021   10:06 AM 11/08/2020    4:49 PM 11/08/2020    3:52 PM 10/26/2020    1:14 PM 09/07/2020   10:41 AM 09/07/2019   11:36 AM 12/22/2017    9:13 AM  PHQ 2/9 Scores  PHQ - 2 Score 0  0 0 0 0 0  PHQ- 9 Score      0   Exception Documentation  Medical reason Other- indicate reason in comment box      Not completed   Verified by Marlou Sa and Laural Benes (Healthcare and [place]).        Fall Risk    09/11/2021   10:17 AM 06/07/2021   11:41 AM 11/08/2020    4:49 PM 10/26/2020    1:12 PM 09/07/2020   10:43 AM  Fall Risk   Falls in the past year? 0 0 1 1 0  Number falls in past yr: 0 0 0 0 0  Injury with Fall? 0 0 1 1 0  Comment    bruised arm   Risk for fall due to : No Fall Risks Impaired mobility History of fall(s);Impaired balance/gait;Impaired mobility History of fall(s) No Fall Risks  Follow up  Education provided Falls evaluation completed;Education provided;Falls prevention discussed  Education provided;Falls prevention discussed Falls evaluation completed    FALL RISK PREVENTION PERTAINING TO THE HOME:  Any stairs in or around the home? Yes  If so, are there any without handrails? No  Home free of loose throw rugs in walkways, pet beds, electrical cords, etc? Yes  Adequate lighting in your home to reduce risk of falls? Yes   ASSISTIVE DEVICES UTILIZED TO PREVENT FALLS:  Life alert? No  Use of a cane, walker or w/c? Yes  Grab bars in the bathroom? Yes  Shower chair or bench in shower? Yes  Elevated toilet seat or a handicapped toilet? No   TIMED UP AND GO:  Was the test performed? Yes .  Length of time to ambulate 10 feet: 10 sec.   Gait slow and steady with assistive device  Cognitive Function:        09/11/2021   10:19 AM 09/07/2019   11:39  AM  6CIT Screen  What Year? 0 points 0 points  What month? 0 points 0 points  What time? 0 points 0 points  Count back from 20 2 points 2 points  Months in reverse 0 points 0 points  Repeat phrase 2 points 2 points  Total Score 4 points 4 points    Immunizations Immunization History  Administered Date(s) Administered   Fluad Quad(high Dose 65+) 10/05/2020   Influenza Split 09/19/2010   Influenza Whole 11/12/2004, 10/21/2006, 10/18/2007, 10/19/2008, 10/25/2009   Influenza,inj,Quad PF,6+ Mos 09/17/2012, 09/19/2013, 09/13/2014, 09/06/2016, 09/14/2017, 09/17/2018   Influenza-Unspecified 08/31/2015, 09/06/2016, 09/14/2017   PFIZER(Purple Top)SARS-COV-2 Vaccination 02/21/2019, 03/22/2019   Pneumococcal Conjugate-13 09/14/2019   Pneumococcal Polysaccharide-23 03/10/2002, 02/08/2009   Td 03/10/2002, 08/08/2014   Zoster Recombinat (Shingrix) 09/22/2017, 12/21/2017   Zoster, Live 08/08/2014    TDAP status: Up to date  Flu Vaccine status: Up to date  Pneumococcal vaccine status: Up to date  Covid-19 vaccine status: Completed vaccines  Qualifies for Shingles Vaccine? Yes   Zostavax completed Yes    Shingrix Completed?: Yes  Screening Tests Health Maintenance  Topic Date Due   URINE MICROALBUMIN  09/13/2020   HEMOGLOBIN A1C  04/04/2021   INFLUENZA VACCINE  08/20/2021   COVID-19 Vaccine (3 - Pfizer series) 09/27/2021 (Originally 05/17/2019)   Pneumonia Vaccine 82+ Years old (3 - PPSV23 or PCV20) 09/12/2022 (Originally 09/13/2020)   MAMMOGRAM  10/25/2021   FOOT EXAM  11/23/2021   OPHTHALMOLOGY EXAM  08/30/2022   COLONOSCOPY (Pts 45-38yr Insurance coverage will need to be confirmed)  07/12/2024   TETANUS/TDAP  08/07/2024   DEXA SCAN  Completed   Hepatitis C Screening  Completed   Zoster Vaccines- Shingrix  Completed   HPV VACCINES  Aged Out    Health Maintenance  Health Maintenance Due  Topic Date Due   URINE MICROALBUMIN  09/13/2020   HEMOGLOBIN A1C  04/04/2021   INFLUENZA VACCINE  08/20/2021    Colorectal cancer screening: Type of screening: Colonoscopy. Completed 07/12/21. Repeat every 3 years  Mammogram status: Completed 10/25/20. Repeat every year  Bone Density status: Completed 10/25/20. Results reflect: Bone density results: OSTEOPOROSIS. Repeat every   years.  Lung Cancer Screening: (Low Dose CT Chest recommended if Age 67-80years, 30 pack-year currently smoking OR have quit w/in 15years.) does not qualify.     Additional Screening:  Hepatitis C Screening: does qualify; Completed 04/16/15  Vision Screening: Recommended annual ophthalmology exams for early detection of glaucoma and other disorders of the eye. Is the patient up to date with their annual eye exam?  Yes  Who is the provider or what is the name of the office in which the patient attends annual eye exams? Dr GSchuyler AmorIf pt is not established with a provider, would they like to be referred to a provider to establish care? No .   Dental Screening: Recommended annual dental exams for proper oral hygiene  Community Resource Referral / Chronic Care Management:  CRR required this visit?  No   CCM  required this visit?  No      Plan:     I have personally reviewed and noted the following in the patient's chart:   Medical and social history Use of alcohol, tobacco or illicit drugs  Current medications and supplements including opioid prescriptions. Patient is not currently taking opioid prescriptions. Functional ability and status Nutritional status Physical activity Advanced directives List of other physicians Hospitalizations, surgeries, and ER visits in previous 12 months Vitals Screenings to include  cognitive, depression, and falls Referrals and appointments  In addition, I have reviewed and discussed with patient certain preventive protocols, quality metrics, and best practice recommendations. A written personalized care plan for preventive services as well as general preventive health recommendations were provided to patient.     Criselda Peaches, LPN   09/20/5174   Nurse Notes: Patient due labs Hemoglobin A1C and Urine Microalbumin

## 2021-09-11 NOTE — Patient Instructions (Addendum)
Linda Thompson , Thank you for taking time to come for your Medicare Wellness Visit. I appreciate your ongoing commitment to your health goals. Please review the following plan we discussed and let me know if I can assist you in the future.   Advanced directives: Yes  Conditions/risks identified: None  Next appointment: Follow up in one year for your annual wellness visit     Preventive Care 65 Years and Older, Female Preventive care refers to lifestyle choices and visits with your health care provider that can promote health and wellness. What does preventive care include? A yearly physical exam. This is also called an annual well check. Dental exams once or twice a year. Routine eye exams. Ask your health care provider how often you should have your eyes checked. Personal lifestyle choices, including: Daily care of your teeth and gums. Regular physical activity. Eating a healthy diet. Avoiding tobacco and drug use. Limiting alcohol use. Practicing safe sex. Taking low-dose aspirin every day. Taking vitamin and mineral supplements as recommended by your health care provider. What happens during an annual well check? The services and screenings done by your health care provider during your annual well check will depend on your age, overall health, lifestyle risk factors, and family history of disease. Counseling  Your health care provider may ask you questions about your: Alcohol use. Tobacco use. Drug use. Emotional well-being. Home and relationship well-being. Sexual activity. Eating habits. History of falls. Memory and ability to understand (cognition). Work and work Statistician. Reproductive health. Screening  You may have the following tests or measurements: Height, weight, and BMI. Blood pressure. Lipid and cholesterol levels. These may be checked every 5 years, or more frequently if you are over 34 years old. Skin check. Lung cancer screening. You may have this  screening every year starting at age 45 if you have a 30-pack-year history of smoking and currently smoke or have quit within the past 15 years. Fecal occult blood test (FOBT) of the stool. You may have this test every year starting at age 15. Flexible sigmoidoscopy or colonoscopy. You may have a sigmoidoscopy every 5 years or a colonoscopy every 10 years starting at age 36. Hepatitis C blood test. Hepatitis B blood test. Sexually transmitted disease (STD) testing. Diabetes screening. This is done by checking your blood sugar (glucose) after you have not eaten for a while (fasting). You may have this done every 1-3 years. Bone density scan. This is done to screen for osteoporosis. You may have this done starting at age 33. Mammogram. This may be done every 1-2 years. Talk to your health care provider about how often you should have regular mammograms. Talk with your health care provider about your test results, treatment options, and if necessary, the need for more tests. Vaccines  Your health care provider may recommend certain vaccines, such as: Influenza vaccine. This is recommended every year. Tetanus, diphtheria, and acellular pertussis (Tdap, Td) vaccine. You may need a Td booster every 10 years. Zoster vaccine. You may need this after age 34. Pneumococcal 13-valent conjugate (PCV13) vaccine. One dose is recommended after age 53. Pneumococcal polysaccharide (PPSV23) vaccine. One dose is recommended after age 33. Talk to your health care provider about which screenings and vaccines you need and how often you need them. This information is not intended to replace advice given to you by your health care provider. Make sure you discuss any questions you have with your health care provider. Document Released: 02/02/2015 Document Revised: 09/26/2015 Document  Reviewed: 11/07/2014 Elsevier Interactive Patient Education  2017 [place] Prevention in the Home Falls can cause injuries.  They can happen to people of all ages. There are many things you can do to make your home safe and to help prevent falls. What can I do on the outside of my home? Regularly fix the edges of walkways and driveways and fix any cracks. Remove anything that might make you trip as you walk through a door, such as a raised step or threshold. Trim any bushes or trees on the path to your home. Use bright outdoor lighting. Clear any walking paths of anything that might make someone trip, such as rocks or tools. Regularly check to see if handrails are loose or broken. Make sure that both sides of any steps have handrails. Any raised decks and porches should have guardrails on the edges. Have any leaves, snow, or ice cleared regularly. Use sand or salt on walking paths during winter. Clean up any spills in your garage right away. This includes oil or grease spills. What can I do in the bathroom? Use night lights. Install grab bars by the toilet and in the tub and shower. Do not use towel bars as grab bars. Use non-skid mats or decals in the tub or shower. If you need to sit down in the shower, use a plastic, non-slip stool. Keep the floor dry. Clean up any water that spills on the floor as soon as it happens. Remove soap buildup in the tub or shower regularly. Attach bath mats securely with double-sided non-slip rug tape. Do not have throw rugs and other things on the floor that can make you trip. What can I do in the bedroom? Use night lights. Make sure that you have a light by your bed that is easy to reach. Do not use any sheets or blankets that are too big for your bed. They should not hang down onto the floor. Have a firm chair that has side arms. You can use this for support while you get dressed. Do not have throw rugs and other things on the floor that can make you trip. What can I do in the kitchen? Clean up any spills right away. Avoid walking on wet floors. Keep items that you use a lot  in easy-to-reach places. If you need to reach something above you, use a strong step stool that has a grab bar. Keep electrical cords out of the way. Do not use floor polish or wax that makes floors slippery. If you must use wax, use non-skid floor wax. Do not have throw rugs and other things on the floor that can make you trip. What can I do with my stairs? Do not leave any items on the stairs. Make sure that there are handrails on both sides of the stairs and use them. Fix handrails that are broken or loose. Make sure that handrails are as long as the stairways. Check any carpeting to make sure that it is firmly attached to the stairs. Fix any carpet that is loose or worn. Avoid having throw rugs at the top or bottom of the stairs. If you do have throw rugs, attach them to the floor with carpet tape. Make sure that you have a light switch at the top of the stairs and the bottom of the stairs. If you do not have them, ask someone to add them for you. What else can I do to help prevent falls? Wear shoes that: Do  not have high heels. Have rubber bottoms. Are comfortable and fit you well. Are closed at the toe. Do not wear sandals. If you use a stepladder: Make sure that it is fully opened. Do not climb a closed stepladder. Make sure that both sides of the stepladder are locked into place. Ask someone to hold it for you, if possible. Clearly mark and make sure that you can see: Any grab bars or handrails. First and last steps. Where the edge of each step is. Use tools that help you move around (mobility aids) if they are needed. These include: Canes. Walkers. Scooters. Crutches. Turn on the lights when you go into a dark area. Replace any light bulbs as soon as they burn out. Set up your furniture so you have a clear path. Avoid moving your furniture around. If any of your floors are uneven, fix them. If there are any pets around you, be aware of where they are. Review your medicines  with your doctor. Some medicines can make you feel dizzy. This can increase your chance of falling. Ask your doctor what other things that you can do to help prevent falls. This information is not intended to replace advice given to you by your health care provider. Make sure you discuss any questions you have with your health care provider. Document Released: 11/02/2008 Document Revised: 06/14/2015 Document Reviewed: 02/10/2014 Elsevier Interactive Patient Education  2017 [employer].

## 2021-09-17 ENCOUNTER — Other Ambulatory Visit: Payer: Self-pay | Admitting: Family Medicine

## 2021-10-03 ENCOUNTER — Encounter (INDEPENDENT_AMBULATORY_CARE_PROVIDER_SITE_OTHER): Payer: Medicare Other | Admitting: Ophthalmology

## 2021-10-03 DIAGNOSIS — H33302 Unspecified retinal break, left eye: Secondary | ICD-10-CM | POA: Diagnosis not present

## 2021-10-03 DIAGNOSIS — I10 Essential (primary) hypertension: Secondary | ICD-10-CM

## 2021-10-03 DIAGNOSIS — H34832 Tributary (branch) retinal vein occlusion, left eye, with macular edema: Secondary | ICD-10-CM

## 2021-10-03 DIAGNOSIS — H43813 Vitreous degeneration, bilateral: Secondary | ICD-10-CM | POA: Diagnosis not present

## 2021-10-03 DIAGNOSIS — H35033 Hypertensive retinopathy, bilateral: Secondary | ICD-10-CM

## 2021-10-05 DIAGNOSIS — Z23 Encounter for immunization: Secondary | ICD-10-CM | POA: Diagnosis not present

## 2021-10-22 ENCOUNTER — Other Ambulatory Visit: Payer: Self-pay | Admitting: Family Medicine

## 2021-10-22 DIAGNOSIS — E1169 Type 2 diabetes mellitus with other specified complication: Secondary | ICD-10-CM

## 2021-10-22 DIAGNOSIS — I10 Essential (primary) hypertension: Secondary | ICD-10-CM

## 2021-11-08 ENCOUNTER — Encounter (INDEPENDENT_AMBULATORY_CARE_PROVIDER_SITE_OTHER): Payer: Medicare Other | Admitting: Ophthalmology

## 2021-11-08 DIAGNOSIS — H35033 Hypertensive retinopathy, bilateral: Secondary | ICD-10-CM

## 2021-11-08 DIAGNOSIS — I10 Essential (primary) hypertension: Secondary | ICD-10-CM | POA: Diagnosis not present

## 2021-11-08 DIAGNOSIS — H33302 Unspecified retinal break, left eye: Secondary | ICD-10-CM | POA: Diagnosis not present

## 2021-11-08 DIAGNOSIS — H34832 Tributary (branch) retinal vein occlusion, left eye, with macular edema: Secondary | ICD-10-CM | POA: Diagnosis not present

## 2021-11-08 DIAGNOSIS — H43813 Vitreous degeneration, bilateral: Secondary | ICD-10-CM | POA: Diagnosis not present

## 2021-11-14 ENCOUNTER — Other Ambulatory Visit: Payer: Self-pay | Admitting: Family Medicine

## 2021-11-14 DIAGNOSIS — E1169 Type 2 diabetes mellitus with other specified complication: Secondary | ICD-10-CM

## 2021-11-18 NOTE — Telephone Encounter (Signed)
Med refill.  Have pt schedule follow-up appointment in the next few months.

## 2021-11-19 ENCOUNTER — Other Ambulatory Visit: Payer: Self-pay | Admitting: Family Medicine

## 2021-11-19 DIAGNOSIS — I10 Essential (primary) hypertension: Secondary | ICD-10-CM

## 2021-12-03 DIAGNOSIS — Z23 Encounter for immunization: Secondary | ICD-10-CM | POA: Diagnosis not present

## 2021-12-06 ENCOUNTER — Ambulatory Visit (INDEPENDENT_AMBULATORY_CARE_PROVIDER_SITE_OTHER): Payer: Medicare Other | Admitting: Podiatry

## 2021-12-06 ENCOUNTER — Encounter: Payer: Self-pay | Admitting: Podiatry

## 2021-12-06 DIAGNOSIS — M2041 Other hammer toe(s) (acquired), right foot: Secondary | ICD-10-CM

## 2021-12-06 DIAGNOSIS — M79674 Pain in right toe(s): Secondary | ICD-10-CM

## 2021-12-06 DIAGNOSIS — E119 Type 2 diabetes mellitus without complications: Secondary | ICD-10-CM

## 2021-12-06 DIAGNOSIS — M79675 Pain in left toe(s): Secondary | ICD-10-CM

## 2021-12-06 DIAGNOSIS — B351 Tinea unguium: Secondary | ICD-10-CM

## 2021-12-06 NOTE — Progress Notes (Signed)

## 2021-12-11 ENCOUNTER — Encounter (INDEPENDENT_AMBULATORY_CARE_PROVIDER_SITE_OTHER): Payer: Medicare Other | Admitting: Ophthalmology

## 2021-12-11 DIAGNOSIS — H35033 Hypertensive retinopathy, bilateral: Secondary | ICD-10-CM

## 2021-12-11 DIAGNOSIS — H43813 Vitreous degeneration, bilateral: Secondary | ICD-10-CM | POA: Diagnosis not present

## 2021-12-11 DIAGNOSIS — H34832 Tributary (branch) retinal vein occlusion, left eye, with macular edema: Secondary | ICD-10-CM

## 2021-12-11 DIAGNOSIS — H33302 Unspecified retinal break, left eye: Secondary | ICD-10-CM

## 2021-12-11 DIAGNOSIS — I10 Essential (primary) hypertension: Secondary | ICD-10-CM

## 2022-01-09 ENCOUNTER — Encounter (INDEPENDENT_AMBULATORY_CARE_PROVIDER_SITE_OTHER): Payer: Medicare Other | Admitting: Ophthalmology

## 2022-01-09 DIAGNOSIS — H43813 Vitreous degeneration, bilateral: Secondary | ICD-10-CM | POA: Diagnosis not present

## 2022-01-09 DIAGNOSIS — H33302 Unspecified retinal break, left eye: Secondary | ICD-10-CM | POA: Diagnosis not present

## 2022-01-09 DIAGNOSIS — I10 Essential (primary) hypertension: Secondary | ICD-10-CM

## 2022-01-09 DIAGNOSIS — H35033 Hypertensive retinopathy, bilateral: Secondary | ICD-10-CM | POA: Diagnosis not present

## 2022-01-09 DIAGNOSIS — H34832 Tributary (branch) retinal vein occlusion, left eye, with macular edema: Secondary | ICD-10-CM | POA: Diagnosis not present

## 2022-01-21 ENCOUNTER — Other Ambulatory Visit: Payer: Self-pay | Admitting: Family Medicine

## 2022-01-21 DIAGNOSIS — E1169 Type 2 diabetes mellitus with other specified complication: Secondary | ICD-10-CM

## 2022-01-22 NOTE — Telephone Encounter (Signed)
Pt called to FU on this refill.

## 2022-02-12 ENCOUNTER — Telehealth: Payer: Self-pay | Admitting: *Deleted

## 2022-02-12 ENCOUNTER — Encounter: Payer: Self-pay | Admitting: *Deleted

## 2022-02-12 NOTE — Patient Instructions (Signed)
Visit Information  Thank you for taking time to visit with me today. Please don't hesitate to contact me if I can be of assistance to you.   Following are the goals we discussed today:   Goals Addressed             This Visit's Progress    COMPLETED: care coordination activity       Care Coordination Interventions: Reviewed medications with patient and discussed adherence with no needed refills Reviewed scheduled/upcoming provider appointments including sufficient transportation source Assessed social determinant of health barriers Educated care management services with no acute needs at this time.          Please call the care guide team at [phone] if you need to cancel or reschedule your appointment.   If you are experiencing a Mental Health or [place] or need someone to talk to, please call the Suicide and Crisis Lifeline: 988  The patient verbalized understanding of instructions, educational materials, and care plan provided today and DECLINED offer to receive copy of patient instructions, educational materials, and care plan.   No further follow up required: no needs  Raina Mina, RN Care Management Coordinator [place] Office [phone]

## 2022-02-12 NOTE — Patient Outreach (Signed)
  Care Coordination   Initial Visit Note   02/12/2022 Name: JACKLYNE BAIK MRN: [id number] DOB: [birth year]  KENDRE SIRES is a 67 y.o. year old female who sees [name], MD for primary care. I  with Marlou Sa (DPR) today.  What matters to the patients health and wellness today?  No needs    Goals Addressed             This Visit's Progress    COMPLETED: care coordination activity       Care Coordination Interventions: Reviewed medications with patient and discussed adherence with no needed refills Reviewed scheduled/upcoming provider appointments including sufficient transportation source Assessed social determinant of health barriers Educated care management services with no acute needs at this time.          SDOH assessments and interventions completed:  Yes  SDOH Interventions Today    Flowsheet Row Most Recent Value  SDOH Interventions   Food Insecurity Interventions Intervention Not Indicated  Housing Interventions Intervention Not Indicated  Transportation Interventions Intervention Not Indicated  Utilities Interventions Intervention Not Indicated        Care Coordination Interventions:  Yes, provided   Follow up plan: No further intervention required.   Encounter Outcome:  Pt. Visit Completed   Raina Mina, RN Care Management Coordinator [place] Office [phone]

## 2022-02-13 ENCOUNTER — Encounter (INDEPENDENT_AMBULATORY_CARE_PROVIDER_SITE_OTHER): Payer: Medicare Other | Admitting: Ophthalmology

## 2022-02-13 DIAGNOSIS — I10 Essential (primary) hypertension: Secondary | ICD-10-CM | POA: Diagnosis not present

## 2022-02-13 DIAGNOSIS — H34832 Tributary (branch) retinal vein occlusion, left eye, with macular edema: Secondary | ICD-10-CM

## 2022-02-13 DIAGNOSIS — H33302 Unspecified retinal break, left eye: Secondary | ICD-10-CM

## 2022-02-13 DIAGNOSIS — H35033 Hypertensive retinopathy, bilateral: Secondary | ICD-10-CM | POA: Diagnosis not present

## 2022-02-13 DIAGNOSIS — H43813 Vitreous degeneration, bilateral: Secondary | ICD-10-CM

## 2022-02-18 ENCOUNTER — Other Ambulatory Visit: Payer: Self-pay | Admitting: Family Medicine

## 2022-02-18 DIAGNOSIS — E1169 Type 2 diabetes mellitus with other specified complication: Secondary | ICD-10-CM

## 2022-02-27 ENCOUNTER — Ambulatory Visit (INDEPENDENT_AMBULATORY_CARE_PROVIDER_SITE_OTHER): Payer: Medicare Other | Admitting: Family Medicine

## 2022-02-27 VITALS — BP 140/86 | HR 95 | Temp 97.8°F | Ht 62.99 in | Wt 169.4 lb

## 2022-02-27 DIAGNOSIS — I10 Essential (primary) hypertension: Secondary | ICD-10-CM | POA: Diagnosis not present

## 2022-02-27 DIAGNOSIS — M1990 Unspecified osteoarthritis, unspecified site: Secondary | ICD-10-CM

## 2022-02-27 DIAGNOSIS — H409 Unspecified glaucoma: Secondary | ICD-10-CM | POA: Diagnosis not present

## 2022-02-27 DIAGNOSIS — K219 Gastro-esophageal reflux disease without esophagitis: Secondary | ICD-10-CM | POA: Diagnosis not present

## 2022-02-27 DIAGNOSIS — M810 Age-related osteoporosis without current pathological fracture: Secondary | ICD-10-CM | POA: Diagnosis not present

## 2022-02-27 DIAGNOSIS — E782 Mixed hyperlipidemia: Secondary | ICD-10-CM | POA: Diagnosis not present

## 2022-02-27 DIAGNOSIS — E119 Type 2 diabetes mellitus without complications: Secondary | ICD-10-CM

## 2022-02-27 MED ORDER — METFORMIN HCL 1000 MG PO TABS: 1000.0000 mg | ORAL_TABLET | Freq: Two times a day (BID) | ORAL | 3 refills | Status: DC

## 2022-02-27 MED ORDER — HYDROCHLOROTHIAZIDE 12.5 MG PO CAPS: 12.5000 mg | ORAL_CAPSULE | Freq: Every day | ORAL | 3 refills | Status: DC

## 2022-02-27 MED ORDER — PANTOPRAZOLE SODIUM 40 MG PO TBEC: 40.0000 mg | DELAYED_RELEASE_TABLET | Freq: Every day | ORAL | 3 refills | Status: DC

## 2022-02-27 MED ORDER — LOVASTATIN 10 MG PO TABS: 20.0000 mg | ORAL_TABLET | Freq: Every day | ORAL | 3 refills | Status: DC

## 2022-02-27 NOTE — Patient Instructions (Signed)
Refills for your medications were sent to your pharmacy.  We will see you again in 4-6 months, sooner as needed.

## 2022-02-27 NOTE — Progress Notes (Signed)
Established Patient Office Visit   Subjective  Patient ID: Linda Thompson, female    DOB: 09-16-1955  Age: 67 y.o. MRN: [id number]  No chief complaint on file. Pt accompanied by her Lynnell Jude, cell# [phone], cell # [phone].  Patient is a 67 year old female with  has a past medical history of  Allergies, Anemia, Chronic low back pain, DDD, arthritis, GERD, Glaucoma, Heart murmur, Hyperlipemia, Hypertension, IBS, Meningioma, Moderate intellectual disabilities, Obesity, Sarcoidosis, Transaminitis, DM II seen for f/u.  Pt states she is doing good.  Has list of meds that she is taking that are prescribed by this provider.  Not sure if she needs refills.  Taking her meds daily as prescribed.  Sees the eye dr.   Not having any issues with GERD will on protonix.  Has f/u with podiatry scheduled.  Pt accompanied by her [place], Marlou Sa.         ROS Negative unless stated above    Objective:     BP (!) 140/86 (BP Location: Left Arm, Patient Position: Sitting, Cuff Size: Normal)   Pulse 95   Temp 97.8 F (36.6 C) (Oral)   Ht 5' 2.99" (1.6 m)   Wt 169 lb 6.4 oz (76.8 kg)   LMP 01/21/1992 (Approximate)   SpO2 97%   BMI 30.02 kg/m    Physical Exam Constitutional:      General: She is not in acute distress.    Appearance: Normal appearance.  HENT:     Head: Normocephalic and atraumatic.     Nose: Nose normal.     Mouth/Throat:     Mouth: Mucous membranes are moist.  Eyes:     Extraocular Movements: Extraocular movements intact.     Conjunctiva/sclera: Conjunctivae normal.     Pupils: Pupils are equal, round, and reactive to light.  Cardiovascular:     Rate and Rhythm: Normal rate and regular rhythm.     Heart sounds: Normal heart sounds. No murmur heard.    No gallop.  Pulmonary:     Effort: Pulmonary effort is normal. No respiratory distress.     Breath sounds: Normal breath sounds. No wheezing, rhonchi or rales.  Abdominal:     General:  Bowel sounds are normal.     Palpations: Abdomen is soft.  Musculoskeletal:        General: Normal range of motion.  Skin:    General: Skin is warm and dry.  Neurological:     Mental Status: She is alert and oriented to person, place, and time. Mental status is at baseline.  Psychiatric:        Mood and Affect: Mood normal.        Behavior: Behavior normal.      No results found for any visits on 02/27/22.    Assessment & Plan:  Type 2 diabetes mellitus without complication, without long-term current use of insulin (HCC) -Previously controlled -Hemoglobin A1c 6.1% on 10/05/2020 -Continue metformin 1000 mg twice daily -Continue lifestyle modifications -     Microalbumin / creatinine urine ratio -     Hemoglobin A1c -     Lipid panel  Mixed hyperlipidemia -Triglycerides 138, total cholesterol 138, LDL 75, HDL 35 on 10/05/2020 -Lifestyle modifications -Continue lovastatin 20 mg nightly -     Lipid panel -     Lovastatin; Take 2 tablets (20 mg total) by mouth at bedtime.  Dispense: 180 tablet; Refill: 3 -     metFORMIN HCl; Take 1 tablet (1,000 mg  total) by mouth 2 (two) times daily with a meal.  Dispense: 180 tablet; Refill: 3  Essential hypertension -Elevated -Recheck BP -Continue current medications including diltiazem 240 mg daily, HCTZ 12.5 mg daily -     Comprehensive metabolic panel -     TSH -     hydroCHLOROthiazide; Take 1 capsule (12.5 mg total) by mouth daily.  Dispense: 90 capsule; Refill: 3  Age-related osteoporosis without current pathological fracture -maximize vit d and calcium intake.   -Concerned about tolerating bisphosphonate 2/2 GERD. -Obtain repeat bone density to assess need for medication. -     VITAMIN D 25 Hydroxy (Vit-D Deficiency, Fractures) -     DG Bone Density; Future  Arthritis -Tylenol as needed -Topical analgesics as needed and other supportive care  Glaucoma of left eye, unspecified glaucoma type -Continue eyedrops/Cosopt as  directed -Continue follow-up with ophthalmology  Gastroesophageal reflux disease, unspecified whether esophagitis present -Avoid foods known to cause symptoms -     Pantoprazole Sodium; Take 1 tablet (40 mg total) by mouth daily.  Dispense: 90 tablet; Refill: 3    No follow-ups on file.   [name], MD

## 2022-03-04 ENCOUNTER — Encounter: Payer: Self-pay | Admitting: Family Medicine

## 2022-03-04 ENCOUNTER — Other Ambulatory Visit: Payer: Medicare Other

## 2022-03-04 LAB — LIPID PANEL
Cholesterol: 166 mg/dL (ref 0–200)
HDL: 37.6 mg/dL — ABNORMAL LOW (ref >39.00)
LDL Cholesterol: 101 mg/dL — ABNORMAL HIGH (ref 0–99)
NonHDL: 128.41
Total CHOL/HDL Ratio: 4
Triglycerides: 139 mg/dL (ref 0.0–149.0)
VLDL: 27.8 mg/dL (ref 0.0–40.0)

## 2022-03-04 LAB — COMPREHENSIVE METABOLIC PANEL
ALT: 32 U/L (ref 0–35)
AST: 24 U/L (ref 0–37)
Albumin: 4.2 g/dL (ref 3.5–5.2)
Alkaline Phosphatase: 83 U/L (ref 39–117)
BUN: 10 mg/dL (ref 6–23)
CO2: 29 mEq/L (ref 19–32)
Calcium: 9.5 mg/dL (ref 8.4–10.5)
Chloride: 103 mEq/L (ref 96–112)
Creatinine, Ser: 0.6 mg/dL (ref 0.40–1.20)
GFR: 92.77 mL/min (ref >60.00)
Glucose, Bld: 127 mg/dL — ABNORMAL HIGH (ref 70–99)
Potassium: 3.7 mEq/L (ref 3.5–5.1)
Sodium: 143 mEq/L (ref 135–145)
Total Bilirubin: 0.3 mg/dL (ref 0.2–1.2)
Total Protein: 6.7 g/dL (ref 6.0–8.3)

## 2022-03-04 LAB — MICROALBUMIN / CREATININE URINE RATIO
Creatinine,U: 65.8 mg/dL
Microalb Creat Ratio: 5 mg/g (ref 0.0–30.0)
Microalb, Ur: 3.3 mg/dL — ABNORMAL HIGH (ref 0.0–1.9)

## 2022-03-04 LAB — TSH: TSH: 4.23 u[IU]/mL (ref 0.35–5.50)

## 2022-03-04 LAB — HEMOGLOBIN A1C: Hgb A1c MFr Bld: 6.2 % (ref 4.6–6.5)

## 2022-03-04 LAB — VITAMIN D 25 HYDROXY (VIT D DEFICIENCY, FRACTURES): VITD: 56.73 ng/mL (ref 30.00–100.00)

## 2022-03-05 ENCOUNTER — Telehealth: Payer: Self-pay | Admitting: Family Medicine

## 2022-03-05 NOTE — Telephone Encounter (Signed)
Patient returned call for lab results.  

## 2022-03-07 NOTE — Telephone Encounter (Signed)
Pt was already inform of lab result on 03/05/2022. See lab note.

## 2022-03-13 ENCOUNTER — Encounter (INDEPENDENT_AMBULATORY_CARE_PROVIDER_SITE_OTHER): Payer: Medicare Other | Admitting: Ophthalmology

## 2022-03-13 DIAGNOSIS — H34832 Tributary (branch) retinal vein occlusion, left eye, with macular edema: Secondary | ICD-10-CM

## 2022-03-13 DIAGNOSIS — H43813 Vitreous degeneration, bilateral: Secondary | ICD-10-CM | POA: Diagnosis not present

## 2022-03-13 DIAGNOSIS — I10 Essential (primary) hypertension: Secondary | ICD-10-CM | POA: Diagnosis not present

## 2022-03-13 DIAGNOSIS — H33302 Unspecified retinal break, left eye: Secondary | ICD-10-CM | POA: Diagnosis not present

## 2022-03-13 DIAGNOSIS — H35033 Hypertensive retinopathy, bilateral: Secondary | ICD-10-CM | POA: Diagnosis not present

## 2022-03-14 ENCOUNTER — Ambulatory Visit (INDEPENDENT_AMBULATORY_CARE_PROVIDER_SITE_OTHER): Payer: Medicare Other | Admitting: Podiatry

## 2022-03-14 ENCOUNTER — Encounter: Payer: Self-pay | Admitting: Podiatry

## 2022-03-14 DIAGNOSIS — E119 Type 2 diabetes mellitus without complications: Secondary | ICD-10-CM

## 2022-03-14 DIAGNOSIS — M79675 Pain in left toe(s): Secondary | ICD-10-CM | POA: Diagnosis not present

## 2022-03-14 DIAGNOSIS — B351 Tinea unguium: Secondary | ICD-10-CM

## 2022-03-14 DIAGNOSIS — M79674 Pain in right toe(s): Secondary | ICD-10-CM | POA: Diagnosis not present

## 2022-03-14 NOTE — Progress Notes (Signed)
This patient returns to my office for at risk foot care.  This patient requires this care by a professional since this patient will be at risk due to having type 2 diabetes.  This patient is unable to cut nails herself since the patient cannot reach her nails.These nails are painful walking and wearing shoes.  This patient presents for at risk foot care today.  General Appearance  Alert, conversant and in no acute stress.  Vascular  Dorsalis pedis and posterior tibial  pulses are palpable  bilaterally.  Capillary return is within normal limits  bilaterally. Temperature is within normal limits  bilaterally.  Neurologic  Senn-Weinstein monofilament wire test within normal limits  bilaterally. Muscle power within normal limits bilaterally.  Nails Thick disfigured discolored nails with subungual debris  from hallux to fifth toes bilaterally. No evidence of bacterial infection or drainage bilaterally.  Orthopedic  No limitations of motion  feet .  No crepitus or effusions noted.  HAV  B/L.  Hammer toes  B/L.  Skin  normotropic skin with no porokeratosis noted bilaterally.  No signs of infections or ulcers noted.     Onychomycosis  Pain in right toes  Pain in left toes  Consent was obtained for treatment procedures.   Mechanical debridement of nails 1-5  bilaterally performed with a nail nipper.  Filed with dremel without incident.    Return office visit  4   months                   Told patient to return for periodic foot care and evaluation due to potential at risk complications.    Gardiner Barefoot DPM

## 2022-04-07 ENCOUNTER — Encounter: Payer: Self-pay | Admitting: Family Medicine

## 2022-04-10 ENCOUNTER — Encounter (INDEPENDENT_AMBULATORY_CARE_PROVIDER_SITE_OTHER): Payer: Medicare Other | Admitting: Ophthalmology

## 2022-04-10 DIAGNOSIS — H35033 Hypertensive retinopathy, bilateral: Secondary | ICD-10-CM | POA: Diagnosis not present

## 2022-04-10 DIAGNOSIS — H43813 Vitreous degeneration, bilateral: Secondary | ICD-10-CM

## 2022-04-10 DIAGNOSIS — H34832 Tributary (branch) retinal vein occlusion, left eye, with macular edema: Secondary | ICD-10-CM

## 2022-04-10 DIAGNOSIS — I10 Essential (primary) hypertension: Secondary | ICD-10-CM | POA: Diagnosis not present

## 2022-04-10 DIAGNOSIS — H33302 Unspecified retinal break, left eye: Secondary | ICD-10-CM | POA: Diagnosis not present

## 2022-05-09 ENCOUNTER — Encounter (INDEPENDENT_AMBULATORY_CARE_PROVIDER_SITE_OTHER): Payer: Medicare Other | Admitting: Ophthalmology

## 2022-05-09 DIAGNOSIS — H35033 Hypertensive retinopathy, bilateral: Secondary | ICD-10-CM | POA: Diagnosis not present

## 2022-05-09 DIAGNOSIS — H43813 Vitreous degeneration, bilateral: Secondary | ICD-10-CM

## 2022-05-09 DIAGNOSIS — I10 Essential (primary) hypertension: Secondary | ICD-10-CM | POA: Diagnosis not present

## 2022-05-09 DIAGNOSIS — H33302 Unspecified retinal break, left eye: Secondary | ICD-10-CM

## 2022-05-09 DIAGNOSIS — H34832 Tributary (branch) retinal vein occlusion, left eye, with macular edema: Secondary | ICD-10-CM | POA: Diagnosis not present

## 2022-06-06 ENCOUNTER — Encounter (INDEPENDENT_AMBULATORY_CARE_PROVIDER_SITE_OTHER): Payer: Medicare Other | Admitting: Ophthalmology

## 2022-06-06 DIAGNOSIS — H43813 Vitreous degeneration, bilateral: Secondary | ICD-10-CM

## 2022-06-06 DIAGNOSIS — H33302 Unspecified retinal break, left eye: Secondary | ICD-10-CM | POA: Diagnosis not present

## 2022-06-06 DIAGNOSIS — I10 Essential (primary) hypertension: Secondary | ICD-10-CM | POA: Diagnosis not present

## 2022-06-06 DIAGNOSIS — H35033 Hypertensive retinopathy, bilateral: Secondary | ICD-10-CM | POA: Diagnosis not present

## 2022-06-06 DIAGNOSIS — H34832 Tributary (branch) retinal vein occlusion, left eye, with macular edema: Secondary | ICD-10-CM | POA: Diagnosis not present

## 2022-06-20 ENCOUNTER — Encounter: Payer: Self-pay | Admitting: Podiatry

## 2022-06-20 ENCOUNTER — Ambulatory Visit (INDEPENDENT_AMBULATORY_CARE_PROVIDER_SITE_OTHER): Payer: Medicare Other | Admitting: Podiatry

## 2022-06-20 DIAGNOSIS — M79674 Pain in right toe(s): Secondary | ICD-10-CM

## 2022-06-20 DIAGNOSIS — M79675 Pain in left toe(s): Secondary | ICD-10-CM | POA: Diagnosis not present

## 2022-06-20 DIAGNOSIS — B351 Tinea unguium: Secondary | ICD-10-CM | POA: Diagnosis not present

## 2022-06-20 DIAGNOSIS — E119 Type 2 diabetes mellitus without complications: Secondary | ICD-10-CM

## 2022-06-20 NOTE — Progress Notes (Signed)

## 2022-06-22 ENCOUNTER — Other Ambulatory Visit: Payer: Self-pay | Admitting: Family Medicine

## 2022-07-10 ENCOUNTER — Encounter (INDEPENDENT_AMBULATORY_CARE_PROVIDER_SITE_OTHER): Payer: Medicare Other | Admitting: Ophthalmology

## 2022-07-10 DIAGNOSIS — H43813 Vitreous degeneration, bilateral: Secondary | ICD-10-CM

## 2022-07-10 DIAGNOSIS — H33302 Unspecified retinal break, left eye: Secondary | ICD-10-CM | POA: Diagnosis not present

## 2022-07-10 DIAGNOSIS — I10 Essential (primary) hypertension: Secondary | ICD-10-CM | POA: Diagnosis not present

## 2022-07-10 DIAGNOSIS — H34832 Tributary (branch) retinal vein occlusion, left eye, with macular edema: Secondary | ICD-10-CM

## 2022-07-10 DIAGNOSIS — H35033 Hypertensive retinopathy, bilateral: Secondary | ICD-10-CM | POA: Diagnosis not present

## 2022-08-14 ENCOUNTER — Encounter (INDEPENDENT_AMBULATORY_CARE_PROVIDER_SITE_OTHER): Payer: Medicare Other | Admitting: Ophthalmology

## 2022-08-14 DIAGNOSIS — H33302 Unspecified retinal break, left eye: Secondary | ICD-10-CM

## 2022-08-14 DIAGNOSIS — H43813 Vitreous degeneration, bilateral: Secondary | ICD-10-CM | POA: Diagnosis not present

## 2022-08-14 DIAGNOSIS — H34832 Tributary (branch) retinal vein occlusion, left eye, with macular edema: Secondary | ICD-10-CM | POA: Diagnosis not present

## 2022-08-14 DIAGNOSIS — H35033 Hypertensive retinopathy, bilateral: Secondary | ICD-10-CM | POA: Diagnosis not present

## 2022-08-14 DIAGNOSIS — I10 Essential (primary) hypertension: Secondary | ICD-10-CM

## 2022-08-22 DIAGNOSIS — K219 Gastro-esophageal reflux disease without esophagitis: Secondary | ICD-10-CM | POA: Diagnosis not present

## 2022-08-22 DIAGNOSIS — Z79899 Other long term (current) drug therapy: Secondary | ICD-10-CM | POA: Diagnosis not present

## 2022-08-22 DIAGNOSIS — E119 Type 2 diabetes mellitus without complications: Secondary | ICD-10-CM | POA: Diagnosis not present

## 2022-08-22 DIAGNOSIS — M1990 Unspecified osteoarthritis, unspecified site: Secondary | ICD-10-CM | POA: Diagnosis not present

## 2022-08-22 DIAGNOSIS — Z7984 Long term (current) use of oral hypoglycemic drugs: Secondary | ICD-10-CM | POA: Diagnosis not present

## 2022-08-22 DIAGNOSIS — I10 Essential (primary) hypertension: Secondary | ICD-10-CM | POA: Diagnosis not present

## 2022-08-22 DIAGNOSIS — K317 Polyp of stomach and duodenum: Secondary | ICD-10-CM | POA: Diagnosis not present

## 2022-08-22 DIAGNOSIS — R011 Cardiac murmur, unspecified: Secondary | ICD-10-CM | POA: Diagnosis not present

## 2022-09-04 DIAGNOSIS — H04123 Dry eye syndrome of bilateral lacrimal glands: Secondary | ICD-10-CM | POA: Diagnosis not present

## 2022-09-04 DIAGNOSIS — H401131 Primary open-angle glaucoma, bilateral, mild stage: Secondary | ICD-10-CM | POA: Diagnosis not present

## 2022-09-04 DIAGNOSIS — E113212 Type 2 diabetes mellitus with mild nonproliferative diabetic retinopathy with macular edema, left eye: Secondary | ICD-10-CM | POA: Diagnosis not present

## 2022-09-04 LAB — HM DIABETES EYE EXAM

## 2022-09-11 ENCOUNTER — Encounter (INDEPENDENT_AMBULATORY_CARE_PROVIDER_SITE_OTHER): Payer: Medicare Other | Admitting: Ophthalmology

## 2022-09-11 DIAGNOSIS — H35033 Hypertensive retinopathy, bilateral: Secondary | ICD-10-CM | POA: Diagnosis not present

## 2022-09-11 DIAGNOSIS — H43813 Vitreous degeneration, bilateral: Secondary | ICD-10-CM

## 2022-09-11 DIAGNOSIS — H33302 Unspecified retinal break, left eye: Secondary | ICD-10-CM | POA: Diagnosis not present

## 2022-09-11 DIAGNOSIS — H34832 Tributary (branch) retinal vein occlusion, left eye, with macular edema: Secondary | ICD-10-CM

## 2022-09-11 DIAGNOSIS — I10 Essential (primary) hypertension: Secondary | ICD-10-CM | POA: Diagnosis not present

## 2022-09-15 ENCOUNTER — Ambulatory Visit (INDEPENDENT_AMBULATORY_CARE_PROVIDER_SITE_OTHER): Payer: Medicare Other

## 2022-09-15 VITALS — Ht 62.99 in | Wt 169.0 lb

## 2022-09-15 DIAGNOSIS — Z0000 Encounter for general adult medical examination without abnormal findings: Secondary | ICD-10-CM | POA: Diagnosis not present

## 2022-09-15 NOTE — Progress Notes (Signed)
Subjective:   Linda Thompson is a 67 y.o. female who presents for Medicare Annual (Subsequent) preventive examination.  Visit Complete: Virtual  I connected with  [name] on 09/15/22 by a audio enabled telemedicine application and verified that I am speaking with the correct person using two identifiers.  Patient Location: Home  Provider Location: Home Office  I discussed the limitations of evaluation and management by telemedicine. The patient expressed understanding and agreed to proceed.     Review of Systems    Vital Signs: Unable to obtain new vitals due to this being a telehealth visit.  Cardiac Risk Factors include: advanced age (>85men, >55 women);hypertension;diabetes mellitus     Objective:    Today's Vitals   09/15/22 1421  Weight: 169 lb (76.7 kg)  Height: 5' 2.99" (1.6 m)   Body mass index is 29.95 kg/m.     09/15/2022    2:32 PM 09/11/2021   10:19 AM 11/08/2020    4:51 PM 10/26/2020    1:15 PM 09/07/2020   10:43 AM 09/07/2019   11:28 AM 12/22/2017    9:13 AM  Advanced Directives  Does Patient Have a Medical Advance Directive? Yes Yes Yes Yes Yes Yes Yes  Type of Estate agent of [place];Living will Healthcare Power of [place];Living will Healthcare Power of [place];Living will Healthcare Power of [place];Living will Healthcare Power of [place];Living will Living will   Does patient want to make changes to medical advance directive? No - Patient declined No - Patient declined No - Guardian declined   No - Patient declined   Copy of Healthcare Power of Attorney in Chart? Yes - validated most recent copy scanned in chart (See row information) Yes - validated most recent copy scanned in chart (See row information) Yes - validated most recent copy scanned in chart (See row information) Yes - validated most recent copy scanned in chart (See row information) Yes - validated most recent copy scanned in chart (See row information)       Current Medications (verified) Outpatient Encounter Medications as of 09/15/2022  Medication Sig   acetaminophen (TYLENOL) 500 MG tablet Take 500 mg by mouth every 6 (six) hours as needed for mild pain.    B-12 MICROLOZENGE 500 MCG SUBL Place under the tongue.   Besifloxacin HCl (BESIVANCE) 0.6 % SUSP Apply to eye. 4 times each day for 2 days   [facility] B-12 500 MCG tablet TAKE 1 TABLET BY MOUTH DAILY   diclofenac sodium (VOLTAREN) 1 % GEL APPLY 2 GRAMS TO AFFECTED AREA 4 TIMES A DAY (Patient taking differently: Apply 2 g topically 4 (four) times daily.)   diltiazem (CARDIZEM CD) 240 MG 24 hr capsule TAKE 1 CAPSULE BY MOUTH EVERY DAY   dorzolamide-timolol (COSOPT) 22.3-6.8 MG/ML ophthalmic solution SMARTSIG:In Eye(s)   ferrous sulfate 325 (65 FE) MG tablet Take 325 mg by mouth 3 (three) times daily with meals.    fluticasone (FLONASE) 50 MCG/ACT nasal spray IINSTILL 2 SPRAYS INTO EACH NOSTRIL AT BEDTIME   hydrochlorothiazide (MICROZIDE) 12.5 MG capsule Take 1 capsule (12.5 mg total) by mouth daily.   hydrocortisone 2.5 % ointment APPLY TO AFFECTED AREA TWICE DAILY MONDAY THRU FRIDAY ONLY   ipratropium (ATROVENT) 0.06 % nasal spray INHALE 1 SPRAY INTO BOTH NOSTRILS 4 TIMES DAILY   ketoconazole (NIZORAL) 2 % cream SMARTSIG:1 Application Topical 1 to 2 Times Daily   ketoconazole (NIZORAL) 2 % shampoo USE AS DIRECTED TWICE A WEEK AS DIRECTED   latanoprost (  XALATAN) 0.005 % ophthalmic solution Place 1 drop into both eyes at bedtime.    loratadine (CLARITIN) 10 MG tablet Take 1 tablet (10 mg total) by mouth daily.   lovastatin (MEVACOR) 10 MG tablet Take 2 tablets (20 mg total) by mouth at bedtime.   metFORMIN (GLUCOPHAGE) 1000 MG tablet Take 1 tablet (1,000 mg total) by mouth 2 (two) times daily with a meal.   mometasone (ELOCON) 0.1 % ointment APPLY A SMALL AMOUNT TO AFFECTED AREA AS DIRECTED   pantoprazole (PROTONIX) 40 MG tablet Take 1 tablet (40 mg total) by mouth daily.   Vitamin D,  Cholecalciferol, 25 MCG (1000 UT) CAPS Take 1,000 Units by mouth daily.   Vitamins-Lipotropics ([facility] INNER EAR PLUS PO) Take 2 tablets by mouth 2 (two) times daily.    [DISCONTINUED] diltiazem (TIAZAC) 240 MG 24 hr capsule Take 1 capsule (240 mg total) by mouth daily.   No facility-administered encounter medications on file as of 09/15/2022.    Allergies (verified) Ace inhibitors, Amoxicillin-pot clavulanate, Augmentin [amoxicillin-pot clavulanate], Azithromycin, Ceftin [cefuroxime axetil], Cefuroxime, Cyclobenzaprine hcl, Flexeril [cyclobenzaprine], Flonase [fluticasone propionate], and Ibuprofen   History: Past Medical History:  Diagnosis Date   Allergic rhinitis    Allergy    Anemia    Chronic low back pain    DDD (degenerative disc disease), lumbar    GERD (gastroesophageal reflux disease)    w/ hx diaphragmatic hernia   Glaucoma    GLAUCOMA NOS 03/11/2006   Qualifier: Diagnosis of  By: Ermalene Searing MD, Amy     Heart murmur    Hx of fracture    R arm, foot   Hyperlipemia    Hypertension    IBS (irritable bowel syndrome)    Low back pain 11/22/2012   Meningioma 12/20/2001   Repeat MR from 2017 - radiology feels not a meningioma, but rather "Thickening of the anterior falx appears to be related to degenerative ossification and not meningioma." HCPOA opted not to do repeat imaging with CT for the possible hemangioma and declined further evaluation of this.   Meningioma (HCC)    ant falx, stable on prior imaging   Moderate intellectual disabilities    Murmur    Obesity    Osteoarthrosis, unspecified whether generalized or localized, ankle and foot    Sarcoidosis    difuse bony lesions and LDA, dx by biopsy in 2016   Transaminitis    Type II or unspecified type diabetes mellitus without mention of complication, not stated as uncontrolled    Past Surgical History:  Procedure Laterality Date   CATARACT EXTRACTION Bilateral    Dr Dione Booze   COLONOSCOPY  04/15/2011   ELBOW SURGERY      right elbow   LIPOMA EXCISION Left [id number]   posterior left axilla   TOTAL ABDOMINAL HYSTERECTOMY  03/05/1992   leiomyoma and cellular leiomyoma   Family History  Problem Relation Age of Onset   Diabetes Mother    Stroke Mother    Hypertension Mother    Stroke Father    Diabetes Maternal Aunt    Heart disease Maternal Aunt         (had pacemaker)   Diabetes Maternal Grandmother    Stroke Maternal Grandmother    Hypertension Maternal Grandmother    Colon cancer Neg Hx    Social History   Socioeconomic History   Marital status: Single    Spouse name: Not on file   Number of children: 0   Years of education: 3rd  Highest education level: 3rd grade  Occupational History   Occupation: Disabled (from arm fracture)  Tobacco Use   Smoking status: Never    Passive exposure: Never   Smokeless tobacco: Never  Vaping Use   Vaping status: Never Used  Substance and Sexual Activity   Alcohol use: No    Alcohol/week: 0.0 standard drinks of alcohol   Drug use: No   Sexual activity: Never    Birth control/protection: Surgical    Comment: Hyst--TAH--unsure if has ovaries  Other Topics Concern   Not on file  Social History Narrative         Currently-disability for arm fracture      Mental Retardation, but is able to drive to places she is comfortable with and prepare her own meals and manage most of her own affairs.      Ms. Kelby Fam (neighbor) is POA/HCPOA      Reports she gets regular exercise and tries to eat healthy   Social Determinants of Health   Financial Resource Strain: Low Risk  (09/15/2022)   Overall Financial Resource Strain (CARDIA)    Difficulty of Paying Living Expenses: Not hard at all  Food Insecurity: No Food Insecurity (09/15/2022)   Hunger Vital Sign    Worried About Running Out of Food in the Last Year: Never true    Ran Out of Food in the Last Year: Never true  Transportation Needs: No Transportation Needs (09/15/2022)   PRAPARE - Therapist, art (Medical): No    Lack of Transportation (Non-Medical): No  Physical Activity: Insufficiently Active (09/15/2022)   Exercise Vital Sign    Days of Exercise per Week: 7 days    Minutes of Exercise per Session: 20 min  Stress: No Stress Concern Present (09/15/2022)   [employer] of Occupational Health - Occupational Stress Questionnaire    Feeling of Stress : Not at all  Social Connections: Moderately Integrated (09/15/2022)   Social Connection and Isolation Panel [NHANES]    Frequency of Communication with Friends and Family: More than three times a week    Frequency of Social Gatherings with Friends and Family: More than three times a week    Attends Religious Services: More than 4 times per year    Active Member of [employer] or Organizations: Yes    Attends Engineer, structural: More than 4 times per year    Marital Status: Never married    Tobacco Counseling Counseling given: Not Answered   Clinical Intake:  Pre-visit preparation completed: Yes  Pain : No/denies pain     BMI - recorded: 29.95 Nutritional Status: BMI > 30  Obese Nutritional Risks: None Diabetes: Yes CBG done?: No Did pt. bring in CBG monitor from home?: No  How often do you need to have someone help you when you read instructions, pamphlets, or other written materials from your doctor or pharmacy?: 1 - Never  Interpreter Needed?: No  Information entered by :: Theresa Mulligan LPN   Activities of Daily Living    09/15/2022    2:31 PM  In your present state of health, do you have any difficulty performing the following activities:  Hearing? 0  Vision? 0  Difficulty concentrating or making decisions? 0  Walking or climbing stairs? 0  Dressing or bathing? 0  Doing errands, shopping? 0  Preparing Food and eating ? N  Using the Toilet? N  In the past six months, have you accidently leaked urine? N  Do you  have problems with loss of bowel control? N  Managing your  Medications? N  Managing your Finances? N  Housekeeping or managing your Housekeeping? N    Patient Care Team: Deeann Saint, MD as PCP - General (Family Medicine)  Indicate any recent Medical Services you may have received from other than [facility] providers in the past year (date may be approximate).     Assessment:   This is a routine wellness examination for [place].  Hearing/Vision screen Hearing Screening - Comments:: Denies hearing difficulties   Vision Screening - Comments:: Wears rx glasses - up to date with routine eye exams with  Dr Dione Booze  Dietary issues and exercise activities discussed:     Goals Addressed               This Visit's Progress     Stay Healthy (pt-stated)         Depression Screen    09/15/2022    2:30 PM 09/11/2021   10:06 AM 11/08/2020    4:49 PM 11/08/2020    3:52 PM 10/26/2020    1:14 PM 09/07/2020   10:41 AM 09/07/2019   11:36 AM  PHQ 2/9 Scores  PHQ - 2 Score 0 0  0 0 0 0  PHQ- 9 Score       0  Exception Documentation   Medical reason Other- indicate reason in comment box     Not completed    Verified by [name] and [name] (Healthcare and Financial Power's of [place]).       Fall Risk    09/15/2022    2:32 PM 09/11/2021   10:17 AM 06/07/2021   11:41 AM 11/08/2020    4:49 PM 10/26/2020    1:12 PM  Fall Risk   Falls in the past year? 0 0 0 1 1  Number falls in past yr: 0 0 0 0 0  Injury with Fall? 0 0 0 1 1  Comment     bruised arm  Risk for fall due to : No Fall Risks No Fall Risks Impaired mobility History of fall(s);Impaired balance/gait;Impaired mobility History of fall(s)  Follow up Falls prevention discussed  Education provided Falls evaluation completed;Education provided;Falls prevention discussed Education provided;Falls prevention discussed    MEDICARE RISK AT HOME: Medicare Risk at Home Any stairs in or around the home?: No If so, are there any without handrails?: No Home free of loose throw rugs  in walkways, pet beds, electrical cords, etc?: Yes Adequate lighting in your home to reduce risk of falls?: Yes Life alert?: Yes Use of a cane, walker or w/c?: Yes Grab bars in the bathroom?: Yes Shower chair or bench in shower?: Yes Elevated toilet seat or a handicapped toilet?: Yes  TIMED UP AND GO:  Was the test performed?  No    Cognitive Function:    12/22/2017    9:17 AM 12/19/2016   10:22 AM 12/11/2015   10:43 AM  MMSE - Mini Mental State Exam  Not completed: -- -- --        09/15/2022    2:33 PM 09/11/2021   10:19 AM 09/07/2019   11:39 AM  6CIT Screen  What Year? 0 points 0 points 0 points  What month? 0 points 0 points 0 points  What time? 0 points 0 points 0 points  Count back from 20 0 points 2 points 2 points  Months in reverse 0 points 0 points 0 points  Repeat phrase 0 points 2  points 2 points  Total Score 0 points 4 points 4 points    Immunizations Immunization History  Administered Date(s) Administered   COVID-19, mRNA, vaccine(Comirnaty)12 years and older 12/03/2021   Fluad Quad(high Dose 65+) 10/05/2020, 10/20/2021   Influenza Split 09/19/2010   Influenza Whole 11/12/2004, 10/21/2006, 10/18/2007, 10/19/2008, 10/25/2009   Influenza,inj,Quad PF,6+ Mos 09/17/2012, 09/19/2013, 09/13/2014, 09/06/2016, 09/14/2017, 09/17/2018   Influenza-Unspecified 08/31/2015, 09/06/2016, 09/14/2017   PFIZER(Purple Top)SARS-COV-2 Vaccination 02/21/2019, 03/22/2019   Pneumococcal Conjugate-13 09/14/2019   Pneumococcal Polysaccharide-23 03/10/2002, 02/08/2009   Td 03/10/2002, 08/08/2014   Zoster Recombinant(Shingrix) 09/22/2017, 12/21/2017   Zoster, Live 08/08/2014    TDAP status: Up to date  Flu Vaccine status: Due, Education has been provided regarding the importance of this vaccine. Advised may receive this vaccine at local pharmacy or Health Dept. Aware to provide a copy of the vaccination record if obtained from local pharmacy or Health Dept. Verbalized acceptance  and understanding.  Pneumococcal vaccine status: Up to date  Covid-19 vaccine status: Declined, Education has been provided regarding the importance of this vaccine but patient still declined. Advised may receive this vaccine at local pharmacy or Health Dept.or vaccine clinic. Aware to provide a copy of the vaccination record if obtained from local pharmacy or Health Dept. Verbalized acceptance and understanding.  Qualifies for Shingles Vaccine? Yes   Zostavax completed Yes   Shingrix Completed?: Yes  Screening Tests Health Maintenance  Topic Date Due   MAMMOGRAM  10/25/2021   FOOT EXAM  11/23/2021   COVID-19 Vaccine (4 - 2023-24 season) 04/03/2022   INFLUENZA VACCINE  08/21/2022   HEMOGLOBIN A1C  09/02/2022   Diabetic kidney evaluation - eGFR measurement  03/05/2023   Diabetic kidney evaluation - Urine ACR  03/05/2023   OPHTHALMOLOGY EXAM  09/04/2023   Medicare Annual Wellness (AWV)  09/15/2023   Colonoscopy  07/12/2024   DTaP/Tdap/Td (3 - Tdap) 08/07/2024   Pneumonia Vaccine 68+ Years old (3 of 3 - PPSV23 or PCV20) 09/13/2024   DEXA SCAN  Completed   Hepatitis C Screening  Completed   Zoster Vaccines- Shingrix  Completed   HPV VACCINES  Aged Out    Health Maintenance  Health Maintenance Due  Topic Date Due   MAMMOGRAM  10/25/2021   FOOT EXAM  11/23/2021   COVID-19 Vaccine (4 - 2023-24 season) 04/03/2022   INFLUENZA VACCINE  08/21/2022   HEMOGLOBIN A1C  09/02/2022    Colorectal cancer screening: Type of screening: Colonoscopy. Completed 07/12/21. Repeat every 3 years  Mammogram status: Ordered Patient deferred. Pt provided with contact info and advised to call to schedule appt.   Bone Density status: Completed 04/07/22. Results reflect: Bone density results: OSTEOPOROSIS. Repeat every   years.  Lung Cancer Screening: (Low Dose CT Chest recommended if Age 8-80 years, 20 pack-year currently smoking OR have quit w/in 15years.) does not qualify.   Additional  Screening:  Hepatitis C Screening: does qualify; Completed 04/16/15  Vision Screening: Recommended annual ophthalmology exams for early detection of glaucoma and other disorders of the eye. Is the patient up to date with their annual eye exam?  Yes  Who is the provider or what is the name of the office in which the patient attends annual eye exams? Dr Dione Booze If pt is not established with a provider, would they like to be referred to a provider to establish care? No .   Dental Screening: Recommended annual dental exams for proper oral hygiene  Diabetic Foot Exam: Diabetic Foot Exam: Overdue, Pt has been advised  about the importance in completing this exam. Pt is scheduled for diabetic foot exam on Followed by PCP.  Community Resource Referral / Chronic Care Management:  CRR required this visit?  No   CCM required this visit?  No     Plan:     I have personally reviewed and noted the following in the patient's chart:   Medical and social history Use of alcohol, tobacco or illicit drugs  Current medications and supplements including opioid prescriptions. Patient is not currently taking opioid prescriptions. Functional ability and status Nutritional status Physical activity Advanced directives List of other physicians Hospitalizations, surgeries, and ER visits in previous 12 months Vitals Screenings to include cognitive, depression, and falls Referrals and appointments  In addition, I have reviewed and discussed with patient certain preventive protocols, quality metrics, and best practice recommendations. A written personalized care plan for preventive services as well as general preventive health recommendations were provided to patient.     Tillie Rung, LPN   1/61/0960   After Visit Summary: (MyChart) Due to this being a telephonic visit, the after visit summary with patients personalized plan was offered to patient via MyChart   Nurse Notes: None

## 2022-09-15 NOTE — Patient Instructions (Addendum)
Linda Thompson , Thank you for taking time to come for your Medicare Wellness Visit. I appreciate your ongoing commitment to your health goals. Please review the following plan we discussed and let me know if I can assist you in the future.   Referrals/Orders/Follow-Ups/Clinician Recommendations:   This is a list of the screening recommended for you and due dates:  Health Maintenance  Topic Date Due   Mammogram  10/25/2021   Complete foot exam   11/23/2021   COVID-19 Vaccine (4 - 2023-24 season) 04/03/2022   Flu Shot  08/21/2022   Hemoglobin A1C  09/02/2022   Yearly kidney function blood test for diabetes  03/05/2023   Yearly kidney health urinalysis for diabetes  03/05/2023   Eye exam for diabetics  09/04/2023   Medicare Annual Wellness Visit  09/15/2023   Colon Cancer Screening  07/12/2024   DTaP/Tdap/Td vaccine (3 - Tdap) 08/07/2024   Pneumonia Vaccine (3 of 3 - PPSV23 or PCV20) 09/13/2024   DEXA scan (bone density measurement)  Completed   Hepatitis C Screening  Completed   Zoster (Shingles) Vaccine  Completed   HPV Vaccine  Aged Out    Advanced directives: (In Chart) A copy of your advanced directives are scanned into your chart should your provider ever need it.  Next Medicare Annual Wellness Visit scheduled for next year: Yes

## 2022-09-19 ENCOUNTER — Ambulatory Visit (INDEPENDENT_AMBULATORY_CARE_PROVIDER_SITE_OTHER): Payer: Medicare Other | Admitting: Podiatry

## 2022-09-19 ENCOUNTER — Encounter: Payer: Self-pay | Admitting: Podiatry

## 2022-09-19 DIAGNOSIS — M79675 Pain in left toe(s): Secondary | ICD-10-CM

## 2022-09-19 DIAGNOSIS — E119 Type 2 diabetes mellitus without complications: Secondary | ICD-10-CM | POA: Diagnosis not present

## 2022-09-19 DIAGNOSIS — B351 Tinea unguium: Secondary | ICD-10-CM

## 2022-09-19 DIAGNOSIS — M79674 Pain in right toe(s): Secondary | ICD-10-CM

## 2022-09-19 NOTE — Progress Notes (Signed)
This patient returns to my office for at risk foot care.  This patient requires this care by a professional since this patient will be at risk due to having type 2 diabetes.  This patient is unable to cut nails herself since the patient cannot reach her nails.These nails are painful walking and wearing shoes.  This patient presents for at risk foot care today.  General Appearance  Alert, conversant and in no acute stress.  Vascular  Dorsalis pedis and posterior tibial  pulses are palpable  bilaterally.  Capillary return is within normal limits  bilaterally. Temperature is within normal limits  bilaterally.  Neurologic  Senn-Weinstein monofilament wire test within normal limits  bilaterally. Muscle power within normal limits bilaterally.  Nails Thick disfigured discolored nails with subungual debris  from hallux to fifth toes bilaterally. No evidence of bacterial infection or drainage bilaterally.  Orthopedic  No limitations of motion  feet .  No crepitus or effusions noted.  HAV  B/L.  Hammer toes  B/L.  Skin  normotropic skin with no porokeratosis noted bilaterally.  No signs of infections or ulcers noted.     Onychomycosis  Pain in right toes  Pain in left toes  Consent was obtained for treatment procedures.   Mechanical debridement of nails 1-5  bilaterally performed with a nail nipper.  Filed with dremel without incident.    Return office visit  3 months                   Told patient to return for periodic foot care and evaluation due to potential at risk complications.   Gregory Mayer DPM  

## 2022-10-10 ENCOUNTER — Encounter (INDEPENDENT_AMBULATORY_CARE_PROVIDER_SITE_OTHER): Payer: Medicare Other | Admitting: Ophthalmology

## 2022-10-10 DIAGNOSIS — H34832 Tributary (branch) retinal vein occlusion, left eye, with macular edema: Secondary | ICD-10-CM | POA: Diagnosis not present

## 2022-10-10 DIAGNOSIS — I10 Essential (primary) hypertension: Secondary | ICD-10-CM | POA: Diagnosis not present

## 2022-10-10 DIAGNOSIS — H35033 Hypertensive retinopathy, bilateral: Secondary | ICD-10-CM

## 2022-10-10 DIAGNOSIS — H43813 Vitreous degeneration, bilateral: Secondary | ICD-10-CM

## 2022-10-10 DIAGNOSIS — H33302 Unspecified retinal break, left eye: Secondary | ICD-10-CM

## 2022-10-10 DIAGNOSIS — Z23 Encounter for immunization: Secondary | ICD-10-CM | POA: Diagnosis not present

## 2022-10-31 ENCOUNTER — Telehealth: Payer: Self-pay | Admitting: Family Medicine

## 2022-10-31 DIAGNOSIS — I10 Essential (primary) hypertension: Secondary | ICD-10-CM

## 2022-10-31 MED ORDER — DILTIAZEM HCL ER COATED BEADS 240 MG PO CP24
240.0000 mg | ORAL_CAPSULE | Freq: Every day | ORAL | 3 refills | Status: DC
Start: 2022-10-31 — End: 2023-09-16

## 2022-10-31 NOTE — Telephone Encounter (Signed)
Prescription Request  10/31/2022  LOV: 02/27/2022  What is the name of the medication or equipment? diltiazem (CARDIZEM CD) 240 MG 24 hr capsule Pt states she is completely out of this Rx.  Have you contacted your pharmacy to request a refill? Yes   Which pharmacy would you like this sent to?   [id number] - [place], [place] -  [address] AT [facility] [place] [address] Phone: [phone] Fax: [phone]    Patient notified that their request is being sent to the clinical staff for review and that they should receive a response within 2 business days.   Please advise at Mobile [phone] (mobile)

## 2022-11-14 ENCOUNTER — Encounter (INDEPENDENT_AMBULATORY_CARE_PROVIDER_SITE_OTHER): Payer: Medicare Other | Admitting: Ophthalmology

## 2022-11-14 DIAGNOSIS — H33302 Unspecified retinal break, left eye: Secondary | ICD-10-CM | POA: Diagnosis not present

## 2022-11-14 DIAGNOSIS — H35033 Hypertensive retinopathy, bilateral: Secondary | ICD-10-CM | POA: Diagnosis not present

## 2022-11-14 DIAGNOSIS — I10 Essential (primary) hypertension: Secondary | ICD-10-CM

## 2022-11-14 DIAGNOSIS — H34832 Tributary (branch) retinal vein occlusion, left eye, with macular edema: Secondary | ICD-10-CM

## 2022-11-14 DIAGNOSIS — H43813 Vitreous degeneration, bilateral: Secondary | ICD-10-CM | POA: Diagnosis not present

## 2022-12-17 ENCOUNTER — Encounter (INDEPENDENT_AMBULATORY_CARE_PROVIDER_SITE_OTHER): Payer: Medicare Other | Admitting: Ophthalmology

## 2022-12-17 DIAGNOSIS — H33302 Unspecified retinal break, left eye: Secondary | ICD-10-CM | POA: Diagnosis not present

## 2022-12-17 DIAGNOSIS — H34832 Tributary (branch) retinal vein occlusion, left eye, with macular edema: Secondary | ICD-10-CM | POA: Diagnosis not present

## 2022-12-17 DIAGNOSIS — H43813 Vitreous degeneration, bilateral: Secondary | ICD-10-CM

## 2022-12-17 DIAGNOSIS — H35033 Hypertensive retinopathy, bilateral: Secondary | ICD-10-CM | POA: Diagnosis not present

## 2022-12-17 DIAGNOSIS — I10 Essential (primary) hypertension: Secondary | ICD-10-CM

## 2023-01-22 ENCOUNTER — Encounter: Payer: Self-pay | Admitting: Podiatry

## 2023-01-22 ENCOUNTER — Ambulatory Visit (INDEPENDENT_AMBULATORY_CARE_PROVIDER_SITE_OTHER): Payer: Medicare Other | Admitting: Podiatry

## 2023-01-22 VITALS — Ht 62.0 in | Wt 169.0 lb

## 2023-01-22 DIAGNOSIS — M79675 Pain in left toe(s): Secondary | ICD-10-CM

## 2023-01-22 DIAGNOSIS — M79674 Pain in right toe(s): Secondary | ICD-10-CM | POA: Diagnosis not present

## 2023-01-22 DIAGNOSIS — B351 Tinea unguium: Secondary | ICD-10-CM

## 2023-01-22 DIAGNOSIS — E119 Type 2 diabetes mellitus without complications: Secondary | ICD-10-CM | POA: Diagnosis not present

## 2023-01-22 NOTE — Progress Notes (Signed)
This patient returns to my office for at risk foot care.  This patient requires this care by a professional since this patient will be at risk due to having type 2 diabetes.  This patient is unable to cut nails herself since the patient cannot reach her nails.These nails are painful walking and wearing shoes.  This patient presents for at risk foot care today.  General Appearance  Alert, conversant and in no acute stress.  Vascular  Dorsalis pedis and posterior tibial  pulses are palpable  bilaterally.  Capillary return is within normal limits  bilaterally. Temperature is within normal limits  bilaterally.  Neurologic  Senn-Weinstein monofilament wire test within normal limits  bilaterally. Muscle power within normal limits bilaterally.  Nails Thick disfigured discolored nails with subungual debris  from hallux to fifth toes bilaterally. No evidence of bacterial infection or drainage bilaterally.  Orthopedic  No limitations of motion  feet .  No crepitus or effusions noted.  HAV  B/L.  Hammer toes  B/L.  Skin  normotropic skin with no porokeratosis noted bilaterally.  No signs of infections or ulcers noted.     Onychomycosis  Pain in right toes  Pain in left toes  Consent was obtained for treatment procedures.   Mechanical debridement of nails 1-5  bilaterally performed with a nail nipper.  Filed with dremel without incident.    Return office visit  4   months                   Told patient to return for periodic foot care and evaluation due to potential at risk complications.    Rome Echavarria DPM  

## 2023-01-28 ENCOUNTER — Encounter (INDEPENDENT_AMBULATORY_CARE_PROVIDER_SITE_OTHER): Payer: Medicare Other | Admitting: Ophthalmology

## 2023-01-28 DIAGNOSIS — H3561 Retinal hemorrhage, right eye: Secondary | ICD-10-CM

## 2023-01-28 DIAGNOSIS — H35033 Hypertensive retinopathy, bilateral: Secondary | ICD-10-CM

## 2023-01-28 DIAGNOSIS — H34832 Tributary (branch) retinal vein occlusion, left eye, with macular edema: Secondary | ICD-10-CM

## 2023-01-28 DIAGNOSIS — H33302 Unspecified retinal break, left eye: Secondary | ICD-10-CM | POA: Diagnosis not present

## 2023-01-28 DIAGNOSIS — I10 Essential (primary) hypertension: Secondary | ICD-10-CM

## 2023-01-28 DIAGNOSIS — H43813 Vitreous degeneration, bilateral: Secondary | ICD-10-CM

## 2023-02-25 ENCOUNTER — Encounter (INDEPENDENT_AMBULATORY_CARE_PROVIDER_SITE_OTHER): Payer: Medicare Other | Admitting: Ophthalmology

## 2023-02-25 DIAGNOSIS — H34832 Tributary (branch) retinal vein occlusion, left eye, with macular edema: Secondary | ICD-10-CM | POA: Diagnosis not present

## 2023-02-25 DIAGNOSIS — H43813 Vitreous degeneration, bilateral: Secondary | ICD-10-CM | POA: Diagnosis not present

## 2023-02-25 DIAGNOSIS — H35033 Hypertensive retinopathy, bilateral: Secondary | ICD-10-CM

## 2023-02-25 DIAGNOSIS — I10 Essential (primary) hypertension: Secondary | ICD-10-CM

## 2023-03-05 DIAGNOSIS — H401131 Primary open-angle glaucoma, bilateral, mild stage: Secondary | ICD-10-CM | POA: Diagnosis not present

## 2023-03-10 ENCOUNTER — Other Ambulatory Visit: Payer: Self-pay | Admitting: Family Medicine

## 2023-03-10 DIAGNOSIS — L304 Erythema intertrigo: Secondary | ICD-10-CM | POA: Diagnosis not present

## 2023-03-10 NOTE — Telephone Encounter (Signed)
 Copied from CRM [id number]. Topic: Clinical - Medication Refill >> Mar 10, 2023 11:19 AM Thomes Dinning wrote: Most Recent Primary Care Visit:  Provider: Tillie Rung  Department: [facility]  Visit Type: MEDICARE AWV, SEQUENTIAL  Date: 09/15/2022  Medication: ketoconazole (NIZORAL) 2 % shampoo  Has the patient contacted their pharmacy? Yes (Agent: If no, request that the patient contact the pharmacy for the refill. If patient does not wish to contact the pharmacy document the reason why and proceed with request.) (Agent: If yes, when and what did the pharmacy advise?) Advised patient to give her provider a call  Is this the correct pharmacy for this prescription? Yes If no, delete pharmacy and type the correct one.  This is the patient's preferred pharmacy:  [id number] - [place], [place] - [address] AT [address] [facility] [address] [place] [address] Phone: [phone] Fax: [phone]   Has the prescription been filled recently? No  Is the patient out of the medication? Yes  Has the patient been seen for an appointment in the last year OR does the patient have an upcoming appointment? Yes  Can we respond through MyChart? No  Agent: Please be advised that Rx refills may take up to 3 business days. We ask that you follow-up with your pharmacy.

## 2023-03-11 MED ORDER — KETOCONAZOLE 2 % EX SHAM: MEDICATED_SHAMPOO | CUTANEOUS | 2 refills | Status: DC

## 2023-03-25 ENCOUNTER — Encounter (INDEPENDENT_AMBULATORY_CARE_PROVIDER_SITE_OTHER): Payer: Medicare Other | Admitting: Ophthalmology

## 2023-03-25 DIAGNOSIS — I10 Essential (primary) hypertension: Secondary | ICD-10-CM | POA: Diagnosis not present

## 2023-03-25 DIAGNOSIS — H35033 Hypertensive retinopathy, bilateral: Secondary | ICD-10-CM

## 2023-03-25 DIAGNOSIS — H43813 Vitreous degeneration, bilateral: Secondary | ICD-10-CM | POA: Diagnosis not present

## 2023-03-25 DIAGNOSIS — H34832 Tributary (branch) retinal vein occlusion, left eye, with macular edema: Secondary | ICD-10-CM

## 2023-03-25 DIAGNOSIS — H33302 Unspecified retinal break, left eye: Secondary | ICD-10-CM | POA: Diagnosis not present

## 2023-04-01 ENCOUNTER — Other Ambulatory Visit: Payer: Self-pay | Admitting: Family Medicine

## 2023-04-01 DIAGNOSIS — E782 Mixed hyperlipidemia: Secondary | ICD-10-CM

## 2023-04-01 NOTE — Telephone Encounter (Signed)
 Copied from CRM [id number]. Topic: Clinical - Medication Refill >> Apr 01, 2023  8:39 AM Randa Ngo wrote: Most Recent Primary Care Visit:  Provider: Tillie Rung  Department: [facility]  Visit Type: MEDICARE AWV, SEQUENTIAL  Date: 09/15/2022  Medication: lovastatin (MEVACOR) 10 MG tablet  Has the patient contacted their pharmacy? Yes (Agent: If no, request that the patient contact the pharmacy for the refill. If patient does not wish to contact the pharmacy document the reason why and proceed with request.) (Agent: If yes, when and what did the pharmacy advise?)  Is this the correct pharmacy for this prescription? Yes If no, delete pharmacy and type the correct one.  This is the patient's preferred pharmacy:  [id number] - [place], [place] - [address] AT CORNER OF [facility] [address] [place] [address] Phone: [phone] Fax: [phone]   Has the prescription been filled recently? No  Is the patient out of the medication? No, patient has about a week left.  Has the patient been seen for an appointment in the last year OR does the patient have an upcoming appointment? Yes  Can we respond through MyChart? No, prefers phone call.  Agent: Please be advised that Rx refills may take up to 3 business days. We ask that you follow-up with your pharmacy.

## 2023-04-03 ENCOUNTER — Other Ambulatory Visit: Payer: Self-pay | Admitting: Family Medicine

## 2023-04-03 DIAGNOSIS — K219 Gastro-esophageal reflux disease without esophagitis: Secondary | ICD-10-CM

## 2023-04-21 ENCOUNTER — Other Ambulatory Visit: Payer: Self-pay | Admitting: Family Medicine

## 2023-04-21 NOTE — Telephone Encounter (Unsigned)
 Copied from CRM [id number]. Topic: Clinical - Medication Refill >> Apr 21, 2023 11:29 AM Alcus Dad wrote: Most Recent Primary Care Visit:  Provider: Tillie Rung  Department: [facility]  Visit Type: MEDICARE AWV, SEQUENTIAL  Date: 09/15/2022  Medication: fluticasone (FLONASE) 50 MCG/ACT nasal spray  Has the patient contacted their pharmacy? Yes (Agent: If no, request that the patient contact the pharmacy for the refill. If patient does not wish to contact the pharmacy document the reason why and proceed with request.) (Agent: If yes, when and what did the pharmacy advise?)  Is this the correct pharmacy for this prescription? Yes If no, delete pharmacy and type the correct one.  This is the patient's preferred pharmacy:  [id number] - [place], [place] - [address] AT CORNER OF [facility] [address] [place] [address] Phone: [phone] Fax: [phone]   Has the prescription been filled recently? Yes  Is the patient out of the medication? No  Has the patient been seen for an appointment in the last year OR does the patient have an upcoming appointment? Yes  Can we respond through MyChart? No  Agent: Please be advised that Rx refills may take up to 3 business days. We ask that you follow-up with your pharmacy.

## 2023-04-21 NOTE — Telephone Encounter (Signed)
 Last Fill: Cardizem: 10/31/22 90 tabs/3 RF       Flonase: 06/23/22 48 mL/1 RF  Last OV: 09/15/22 AWV Next OV: 09/18/23 AWV  Routing to provider for review/authorization.     Copied from CRM [id number]. Topic: Clinical - Medication Refill >> Apr 21, 2023 11:29 AM Alcus Dad wrote: Most Recent Primary Care Visit:  Provider: Tillie Rung  Department: [facility]  Visit Type: MEDICARE AWV, SEQUENTIAL  Date: 09/15/2022  Medication: fluticasone (FLONASE) 50 MCG/ACT nasal spray  Has the patient contacted their pharmacy? Yes (Agent: If no, request that the patient contact the pharmacy for the refill. If patient does not wish to contact the pharmacy document the reason why and proceed with request.) (Agent: If yes, when and what did the pharmacy advise?)  Is this the correct pharmacy for this prescription? Yes If no, delete pharmacy and type the correct one.  This is the patient's preferred pharmacy:  [id number] - [place], [place] - [address] AT CORNER OF [facility] [address] [place] [address] Phone: [phone] Fax: [phone]   Has the prescription been filled recently? Yes  Is the patient out of the medication? No  Has the patient been seen for an appointment in the last year OR does the patient have an upcoming appointment? Yes  Can we respond through MyChart? No  Agent: Please be advised that Rx refills may take up to 3 business days. We ask that you follow-up with your pharmacy. >> Apr 21, 2023 12:01 PM Almira Coaster wrote: Patient would also like to add diltiazem (CARDIZEM CD) 240 MG 24 hr capsule on the refill request.

## 2023-04-22 ENCOUNTER — Encounter (INDEPENDENT_AMBULATORY_CARE_PROVIDER_SITE_OTHER): Admitting: Ophthalmology

## 2023-04-22 DIAGNOSIS — H33302 Unspecified retinal break, left eye: Secondary | ICD-10-CM | POA: Diagnosis not present

## 2023-04-22 DIAGNOSIS — H35033 Hypertensive retinopathy, bilateral: Secondary | ICD-10-CM | POA: Diagnosis not present

## 2023-04-22 DIAGNOSIS — I10 Essential (primary) hypertension: Secondary | ICD-10-CM

## 2023-04-22 DIAGNOSIS — H43813 Vitreous degeneration, bilateral: Secondary | ICD-10-CM | POA: Diagnosis not present

## 2023-04-22 DIAGNOSIS — H34832 Tributary (branch) retinal vein occlusion, left eye, with macular edema: Secondary | ICD-10-CM

## 2023-04-23 ENCOUNTER — Other Ambulatory Visit: Payer: Self-pay | Admitting: Family Medicine

## 2023-04-24 MED ORDER — FLUTICASONE PROPIONATE 50 MCG/ACT NA SUSP: NASAL | 0 refills | Status: DC

## 2023-04-29 ENCOUNTER — Other Ambulatory Visit: Payer: Self-pay | Admitting: Family Medicine

## 2023-04-29 DIAGNOSIS — K219 Gastro-esophageal reflux disease without esophagitis: Secondary | ICD-10-CM

## 2023-05-12 ENCOUNTER — Other Ambulatory Visit: Payer: Self-pay | Admitting: Family Medicine

## 2023-05-12 DIAGNOSIS — E782 Mixed hyperlipidemia: Secondary | ICD-10-CM

## 2023-05-12 DIAGNOSIS — I10 Essential (primary) hypertension: Secondary | ICD-10-CM

## 2023-05-14 ENCOUNTER — Telehealth: Payer: Self-pay | Admitting: Family Medicine

## 2023-05-14 NOTE — Telephone Encounter (Signed)
 Copied from CRM [id number]. Topic: Clinical - Prescription Issue >> May 14, 2023 10:47 AM Jethro Morrison wrote: Reason for CRM: hydrochlorothiazide  (MICROZIDE ) 12.5 MG capsule AND metFORMIN  (GLUCOPHAGE ) 1000 MG tablet. PT STATED SHE SHE CALLED 04/22 BUT THE PHARMACY STATED THEY ARE WAITING ON THE DOCTOR, THE PHARMACY TOLD HER THEY WOULD FAX THE PRESCRIPTION.  BOTH MEDS ARE SHOWING PENDING

## 2023-05-14 NOTE — Telephone Encounter (Signed)
 Sent to the pharmacy

## 2023-05-20 ENCOUNTER — Encounter (INDEPENDENT_AMBULATORY_CARE_PROVIDER_SITE_OTHER): Admitting: Ophthalmology

## 2023-05-20 DIAGNOSIS — H35033 Hypertensive retinopathy, bilateral: Secondary | ICD-10-CM | POA: Diagnosis not present

## 2023-05-20 DIAGNOSIS — H33302 Unspecified retinal break, left eye: Secondary | ICD-10-CM | POA: Diagnosis not present

## 2023-05-20 DIAGNOSIS — H34832 Tributary (branch) retinal vein occlusion, left eye, with macular edema: Secondary | ICD-10-CM

## 2023-05-20 DIAGNOSIS — I10 Essential (primary) hypertension: Secondary | ICD-10-CM | POA: Diagnosis not present

## 2023-05-20 DIAGNOSIS — H43813 Vitreous degeneration, bilateral: Secondary | ICD-10-CM

## 2023-05-22 ENCOUNTER — Ambulatory Visit (INDEPENDENT_AMBULATORY_CARE_PROVIDER_SITE_OTHER): Payer: Medicare Other | Admitting: Podiatry

## 2023-05-22 ENCOUNTER — Encounter: Payer: Self-pay | Admitting: Podiatry

## 2023-05-22 DIAGNOSIS — B351 Tinea unguium: Secondary | ICD-10-CM | POA: Diagnosis not present

## 2023-05-22 DIAGNOSIS — E119 Type 2 diabetes mellitus without complications: Secondary | ICD-10-CM

## 2023-05-22 DIAGNOSIS — M79675 Pain in left toe(s): Secondary | ICD-10-CM | POA: Diagnosis not present

## 2023-05-22 DIAGNOSIS — M79674 Pain in right toe(s): Secondary | ICD-10-CM

## 2023-05-22 NOTE — Progress Notes (Signed)
This patient returns to my office for at risk foot care.  This patient requires this care by a professional since this patient will be at risk due to having type 2 diabetes.  This patient is unable to cut nails herself since the patient cannot reach her nails.These nails are painful walking and wearing shoes.  This patient presents for at risk foot care today.  General Appearance  Alert, conversant and in no acute stress.  Vascular  Dorsalis pedis and posterior tibial  pulses are palpable  bilaterally.  Capillary return is within normal limits  bilaterally. Temperature is within normal limits  bilaterally.  Neurologic  Senn-Weinstein monofilament wire test within normal limits  bilaterally. Muscle power within normal limits bilaterally.  Nails Thick disfigured discolored nails with subungual debris  from hallux to fifth toes bilaterally. No evidence of bacterial infection or drainage bilaterally.  Orthopedic  No limitations of motion  feet .  No crepitus or effusions noted.  HAV  B/L.  Hammer toes  B/L.  Skin  normotropic skin with no porokeratosis noted bilaterally.  No signs of infections or ulcers noted.     Onychomycosis  Pain in right toes  Pain in left toes  Consent was obtained for treatment procedures.   Mechanical debridement of nails 1-5  bilaterally performed with a nail nipper.  Filed with dremel without incident.    Return office visit  4   months                   Told patient to return for periodic foot care and evaluation due to potential at risk complications.    Rome Echavarria DPM  

## 2023-06-18 ENCOUNTER — Encounter (INDEPENDENT_AMBULATORY_CARE_PROVIDER_SITE_OTHER): Admitting: Ophthalmology

## 2023-06-18 DIAGNOSIS — H43813 Vitreous degeneration, bilateral: Secondary | ICD-10-CM | POA: Diagnosis not present

## 2023-06-18 DIAGNOSIS — I10 Essential (primary) hypertension: Secondary | ICD-10-CM | POA: Diagnosis not present

## 2023-06-18 DIAGNOSIS — H34832 Tributary (branch) retinal vein occlusion, left eye, with macular edema: Secondary | ICD-10-CM

## 2023-06-18 DIAGNOSIS — H35033 Hypertensive retinopathy, bilateral: Secondary | ICD-10-CM

## 2023-06-18 DIAGNOSIS — H33302 Unspecified retinal break, left eye: Secondary | ICD-10-CM | POA: Diagnosis not present

## 2023-07-16 ENCOUNTER — Encounter (INDEPENDENT_AMBULATORY_CARE_PROVIDER_SITE_OTHER): Admitting: Ophthalmology

## 2023-07-16 DIAGNOSIS — H35033 Hypertensive retinopathy, bilateral: Secondary | ICD-10-CM

## 2023-07-16 DIAGNOSIS — I10 Essential (primary) hypertension: Secondary | ICD-10-CM | POA: Diagnosis not present

## 2023-07-16 DIAGNOSIS — H43813 Vitreous degeneration, bilateral: Secondary | ICD-10-CM

## 2023-07-16 DIAGNOSIS — H34832 Tributary (branch) retinal vein occlusion, left eye, with macular edema: Secondary | ICD-10-CM

## 2023-08-13 ENCOUNTER — Encounter (INDEPENDENT_AMBULATORY_CARE_PROVIDER_SITE_OTHER): Admitting: Ophthalmology

## 2023-08-13 DIAGNOSIS — I10 Essential (primary) hypertension: Secondary | ICD-10-CM | POA: Diagnosis not present

## 2023-08-13 DIAGNOSIS — H33302 Unspecified retinal break, left eye: Secondary | ICD-10-CM | POA: Diagnosis not present

## 2023-08-13 DIAGNOSIS — H43813 Vitreous degeneration, bilateral: Secondary | ICD-10-CM

## 2023-08-13 DIAGNOSIS — H35033 Hypertensive retinopathy, bilateral: Secondary | ICD-10-CM | POA: Diagnosis not present

## 2023-08-13 DIAGNOSIS — H34832 Tributary (branch) retinal vein occlusion, left eye, with macular edema: Secondary | ICD-10-CM | POA: Diagnosis not present

## 2023-09-03 DIAGNOSIS — E119 Type 2 diabetes mellitus without complications: Secondary | ICD-10-CM | POA: Diagnosis not present

## 2023-09-03 DIAGNOSIS — H04123 Dry eye syndrome of bilateral lacrimal glands: Secondary | ICD-10-CM | POA: Diagnosis not present

## 2023-09-03 DIAGNOSIS — Z961 Presence of intraocular lens: Secondary | ICD-10-CM | POA: Diagnosis not present

## 2023-09-03 DIAGNOSIS — H401131 Primary open-angle glaucoma, bilateral, mild stage: Secondary | ICD-10-CM | POA: Diagnosis not present

## 2023-09-03 LAB — HM DIABETES EYE EXAM

## 2023-09-08 ENCOUNTER — Other Ambulatory Visit: Payer: Self-pay | Admitting: Family Medicine

## 2023-09-08 DIAGNOSIS — K219 Gastro-esophageal reflux disease without esophagitis: Secondary | ICD-10-CM

## 2023-09-08 MED ORDER — PANTOPRAZOLE SODIUM 40 MG PO TBEC: 40.0000 mg | DELAYED_RELEASE_TABLET | Freq: Every day | ORAL | 0 refills | Status: DC

## 2023-09-08 MED ORDER — FLUTICASONE PROPIONATE 50 MCG/ACT NA SUSP: NASAL | 0 refills | Status: DC

## 2023-09-08 MED ORDER — KETOCONAZOLE 2 % EX SHAM: MEDICATED_SHAMPOO | CUTANEOUS | 0 refills | Status: DC

## 2023-09-08 NOTE — Telephone Encounter (Signed)
 Copied from CRM [id number]. Topic: Clinical - Medication Refill >> Sep 08, 2023 10:18 AM Franky GRADE wrote: Medication: pantoprazole  (PROTONIX ) 40 MG tablet [[id number]],ketoconazole  (NIZORAL ) 2 % shampoo [[id number]],fluticasone  (FLONASE ) 50 MCG/ACT nasal spray [[id number]]  Has the patient contacted their pharmacy? No, she called but was never able to get a hold of a rep.  (Agent: If no, request that the patient contact the pharmacy for the refill. If patient does not wish to contact the pharmacy document the reason why and proceed with request.) (Agent: If yes, when and what did the pharmacy advise?)  This is the patient's preferred pharmacy:  [id number] - [place], [place] - [address] AT [address]NER OF [facility] [address] [place] [address] Phone: [phone] Fax: [phone]  Is this the correct pharmacy for this prescription? Yes If no, delete pharmacy and type the correct one.   Has the prescription been filled recently? No  Is the patient out of the medication? No  Has the patient been seen for an appointment in the last year OR does the patient have an upcoming appointment? No  Can we respond through MyChart? No  Agent: Please be advised that Rx refills may take up to 3 business days. We ask that you follow-up with your pharmacy.

## 2023-09-10 ENCOUNTER — Encounter (INDEPENDENT_AMBULATORY_CARE_PROVIDER_SITE_OTHER): Admitting: Ophthalmology

## 2023-09-10 DIAGNOSIS — I10 Essential (primary) hypertension: Secondary | ICD-10-CM | POA: Diagnosis not present

## 2023-09-10 DIAGNOSIS — H43813 Vitreous degeneration, bilateral: Secondary | ICD-10-CM | POA: Diagnosis not present

## 2023-09-10 DIAGNOSIS — H35033 Hypertensive retinopathy, bilateral: Secondary | ICD-10-CM

## 2023-09-10 DIAGNOSIS — H34832 Tributary (branch) retinal vein occlusion, left eye, with macular edema: Secondary | ICD-10-CM | POA: Diagnosis not present

## 2023-09-16 ENCOUNTER — Encounter: Payer: Self-pay | Admitting: Family Medicine

## 2023-09-16 ENCOUNTER — Ambulatory Visit: Admitting: Family Medicine

## 2023-09-16 VITALS — BP 142/84 | HR 85 | Temp 98.0°F | Ht 62.0 in | Wt 187.2 lb

## 2023-09-16 DIAGNOSIS — D869 Sarcoidosis, unspecified: Secondary | ICD-10-CM | POA: Diagnosis not present

## 2023-09-16 DIAGNOSIS — M810 Age-related osteoporosis without current pathological fracture: Secondary | ICD-10-CM | POA: Diagnosis not present

## 2023-09-16 DIAGNOSIS — Z6834 Body mass index (BMI) 34.0-34.9, adult: Secondary | ICD-10-CM | POA: Diagnosis not present

## 2023-09-16 DIAGNOSIS — I10 Essential (primary) hypertension: Secondary | ICD-10-CM

## 2023-09-16 DIAGNOSIS — K219 Gastro-esophageal reflux disease without esophagitis: Secondary | ICD-10-CM

## 2023-09-16 DIAGNOSIS — Z7984 Long term (current) use of oral hypoglycemic drugs: Secondary | ICD-10-CM

## 2023-09-16 DIAGNOSIS — E119 Type 2 diabetes mellitus without complications: Secondary | ICD-10-CM

## 2023-09-16 DIAGNOSIS — E782 Mixed hyperlipidemia: Secondary | ICD-10-CM | POA: Diagnosis not present

## 2023-09-16 DIAGNOSIS — E66811 Obesity, class 1: Secondary | ICD-10-CM | POA: Diagnosis not present

## 2023-09-16 LAB — COMPREHENSIVE METABOLIC PANEL WITH GFR
ALT: 44 U/L — ABNORMAL HIGH (ref 0–35)
AST: 22 U/L (ref 0–37)
Albumin: 4 g/dL (ref 3.5–5.2)
Alkaline Phosphatase: 94 U/L (ref 39–117)
BUN: 17 mg/dL (ref 6–23)
CO2: 24 meq/L (ref 19–32)
Calcium: 8.6 mg/dL (ref 8.4–10.5)
Chloride: 107 meq/L (ref 96–112)
Creatinine, Ser: 0.49 mg/dL (ref 0.40–1.20)
GFR: 96.37 mL/min (ref >60.00)
Glucose, Bld: 111 mg/dL — ABNORMAL HIGH (ref 70–99)
Potassium: 3.9 meq/L (ref 3.5–5.1)
Sodium: 143 meq/L (ref 135–145)
Total Bilirubin: 0.3 mg/dL (ref 0.2–1.2)
Total Protein: 6.5 g/dL (ref 6.0–8.3)

## 2023-09-16 LAB — CBC WITH DIFFERENTIAL/PLATELET
Basophils Absolute: 0.1 K/uL (ref 0.0–0.1)
Basophils Relative: 0.7 % (ref 0.0–3.0)
Eosinophils Absolute: 0.3 K/uL (ref 0.0–0.7)
Eosinophils Relative: 3.5 % (ref 0.0–5.0)
HCT: 46.6 % — ABNORMAL HIGH (ref 36.0–46.0)
Hemoglobin: 14.9 g/dL (ref 12.0–15.0)
Lymphocytes Relative: 20.7 % (ref 12.0–46.0)
Lymphs Abs: 1.5 K/uL (ref 0.7–4.0)
MCHC: 31.9 g/dL (ref 30.0–36.0)
MCV: 92.4 fl (ref 78.0–100.0)
Monocytes Absolute: 0.6 K/uL (ref 0.1–1.0)
Monocytes Relative: 8.9 % (ref 3.0–12.0)
Neutro Abs: 4.8 K/uL (ref 1.4–7.7)
Neutrophils Relative %: 66.2 % (ref 43.0–77.0)
Platelets: 299 K/uL (ref 150.0–400.0)
RBC: 5.04 Mil/uL (ref 3.87–5.11)
RDW: 13.8 % (ref 11.5–15.5)
WBC: 7.3 K/uL (ref 4.0–10.5)

## 2023-09-16 LAB — LIPID PANEL
Cholesterol: 147 mg/dL (ref 0–200)
HDL: 33.1 mg/dL — ABNORMAL LOW (ref >39.00)
LDL Cholesterol: 79 mg/dL (ref 0–99)
NonHDL: 113.66
Total CHOL/HDL Ratio: 4
Triglycerides: 172 mg/dL — ABNORMAL HIGH (ref 0.0–149.0)
VLDL: 34.4 mg/dL (ref 0.0–40.0)

## 2023-09-16 LAB — VITAMIN B12: Vitamin B-12: 531 pg/mL (ref 211–911)

## 2023-09-16 LAB — T4, FREE: Free T4: 0.64 ng/dL (ref 0.60–1.60)

## 2023-09-16 LAB — TSH: TSH: 1.99 u[IU]/mL (ref 0.35–5.50)

## 2023-09-16 LAB — HEMOGLOBIN A1C: Hgb A1c MFr Bld: 6.9 % — ABNORMAL HIGH (ref 4.6–6.5)

## 2023-09-16 LAB — VITAMIN D 25 HYDROXY (VIT D DEFICIENCY, FRACTURES): VITD: 29.59 ng/mL — ABNORMAL LOW (ref 30.00–100.00)

## 2023-09-16 LAB — MICROALBUMIN / CREATININE URINE RATIO
Creatinine,U: 64.6 mg/dL
Microalb Creat Ratio: 15.3 mg/g (ref 0.0–30.0)
Microalb, Ur: 1 mg/dL (ref 0.0–1.9)

## 2023-09-16 MED ORDER — DILTIAZEM HCL ER COATED BEADS 240 MG PO CP24: 240.0000 mg | ORAL_CAPSULE | Freq: Every day | ORAL | 3 refills | Status: AC

## 2023-09-16 MED ORDER — KETOCONAZOLE 2 % EX SHAM: MEDICATED_SHAMPOO | CUTANEOUS | 0 refills | Status: AC

## 2023-09-16 MED ORDER — PANTOPRAZOLE SODIUM 40 MG PO TBEC: 40.0000 mg | DELAYED_RELEASE_TABLET | Freq: Every day | ORAL | 0 refills | Status: DC

## 2023-09-16 MED ORDER — FLUTICASONE PROPIONATE 50 MCG/ACT NA SUSP: NASAL | 0 refills | Status: AC

## 2023-09-16 NOTE — Progress Notes (Signed)
 Established Patient Office Visit   Subjective  Patient ID: Linda Thompson, female    DOB: [birth year]  Age: 68 y.o. MRN: [id number]  Chief Complaint  Patient presents with   Medical Management of Chronic Issues    Patient came in today for a medication follow-up and refill     Pt is a 68 yo female seen for f/u and med refills.  Pt states she is doing well.  Pt has a legal guardian, but lives independently.  Had f/u with eye dr in March/April of this yr.  Requesting refills in bp meds, pantoprazole , shampoo and spray?  Pt mentions having a fall and injuring her R elbow.  Now using rollator for assistance.    Patient Active Problem List   Diagnosis Date Noted   Osteoporosis 12/11/2020   Pain due to onychomycosis of toenails of both feet 07/07/2018   Diabetes mellitus without complication (HCC) 07/07/2018   Murmur 02/17/2018   Hypertension associated with diabetes (HCC) 04/20/2017   Hyperlipidemia associated with type 2 diabetes mellitus (HCC) 04/20/2017   Arthritis 12/03/2015   Multiple gastric polyps 08/30/2015   Sarcoidosis 10/27/2014   Morbid obesity (HCC) 05/05/2011   Irritable bowel syndrome 03/05/2010   Diabetes (HCC) 08/13/2007   Allergic rhinitis 09/23/2006   Moderate intellectual disabilities 03/11/2006   Unspecified glaucoma 03/11/2006   Gastroesophageal reflux disease 03/11/2006   Past Medical History:  Diagnosis Date   Allergic rhinitis    Allergy    Anemia    Chronic low back pain    DDD (degenerative disc disease), lumbar    GERD (gastroesophageal reflux disease)    w/ hx diaphragmatic hernia   Glaucoma    GLAUCOMA NOS 03/11/2006   Qualifier: Diagnosis of  By: Avelina MD, Amy     Heart murmur    Hx of fracture    R arm, foot   Hyperlipemia    Hypertension    IBS (irritable bowel syndrome)    Low back pain 11/22/2012   Meningioma 12/20/2001   Repeat MR from 2017 - radiology feels not a meningioma, but rather Thickening of the anterior falx appears to  be related to degenerative ossification and not meningioma. HCPOA opted not to do repeat imaging with CT for the possible hemangioma and declined further evaluation of this.   Meningioma (HCC)    ant falx, stable on prior imaging   Moderate intellectual disabilities    Murmur    Obesity    Osteoarthrosis, unspecified whether generalized or localized, ankle and foot    Sarcoidosis    difuse bony lesions and LDA, dx by biopsy in 2016   Transaminitis    Type II or unspecified type diabetes mellitus without mention of complication, not stated as uncontrolled    Past Surgical History:  Procedure Laterality Date   CATARACT EXTRACTION Bilateral    Dr Octavia   COLONOSCOPY  04/15/2011   ELBOW SURGERY     right elbow   LIPOMA EXCISION Left [id number]   posterior left axilla   TOTAL ABDOMINAL HYSTERECTOMY  03/05/1992   leiomyoma and cellular leiomyoma   Social History   Tobacco Use   Smoking status: Never    Passive exposure: Never   Smokeless tobacco: Never  Vaping Use   Vaping status: Never Used  Substance Use Topics   Alcohol use: No    Alcohol/week: 0.0 standard drinks of alcohol   Drug use: No   Family History  Problem Relation Age of Onset   Diabetes Mother  Stroke Mother    Hypertension Mother    Stroke Father    Diabetes Maternal Aunt    Heart disease Maternal Aunt         (had pacemaker)   Diabetes Maternal Grandmother    Stroke Maternal Grandmother    Hypertension Maternal Grandmother    Colon cancer Neg Hx    Allergies  Allergen Reactions   Ace Inhibitors Cough   Amoxicillin-Pot Clavulanate Diarrhea   Augmentin [Amoxicillin-Pot Clavulanate] Other (See Comments)    diarrhea   Azithromycin  Diarrhea   Ceftin  [Cefuroxime  Axetil]     diarrhea   Cefuroxime  Diarrhea and Other (See Comments)    Generalized weakness   Cyclobenzaprine  Hcl Diarrhea   Flexeril  [Cyclobenzaprine ] Diarrhea   Flonase  [Fluticasone  Propionate] Other (See Comments)    Nose bleed    Ibuprofen Nausea And Vomiting    ROS Negative unless stated above    Objective:     BP (!) 142/84 (BP Location: Left Arm, Patient Position: Sitting, Cuff Size: Large)   Pulse 85   Temp 98 F (36.7 C) (Oral)   Ht 5' 2 (1.575 m)   Wt 187 lb 3.2 oz (84.9 kg)   LMP 01/21/1992 (Approximate)   SpO2 96%   BMI 34.24 kg/m  BP Readings from Last 3 Encounters:  09/16/23 (!) 142/84  02/27/22 (!) 140/86  09/11/21 128/62   Wt Readings from Last 3 Encounters:  09/16/23 187 lb 3.2 oz (84.9 kg)  01/22/23 169 lb (76.7 kg)  09/15/22 169 lb (76.7 kg)      Physical Exam Constitutional:      General: She is not in acute distress.    Appearance: Normal appearance.  HENT:     Head: Normocephalic and atraumatic.     Nose: Nose normal.     Mouth/Throat:     Mouth: Mucous membranes are moist.  Cardiovascular:     Rate and Rhythm: Normal rate and regular rhythm.     Heart sounds: Normal heart sounds. No murmur heard.    No gallop.  Pulmonary:     Effort: Pulmonary effort is normal. No respiratory distress.     Breath sounds: Normal breath sounds. No wheezing, rhonchi or rales.  Skin:    General: Skin is warm and dry.  Neurological:     Mental Status: She is alert and oriented to person, place, and time.    Diabetic Foot Exam - Simple   Simple Foot Form Diabetic Foot exam was performed with the following findings: Yes 09/16/2023 11:13 AM  Visual Inspection No deformities, no ulcerations, no other skin breakdown bilaterally: Yes Sensation Testing Intact to touch and monofilament testing bilaterally: Yes Pulse Check Posterior Tibialis and Dorsalis pulse intact bilaterally: Yes Comments         09/16/2023   12:42 PM 09/15/2022    2:30 PM 09/11/2021   10:06 AM  Depression screen PHQ 2/9  Decreased Interest 0 0 0  Down, Depressed, Hopeless 0 0 0  PHQ - 2 Score 0 0 0  Altered sleeping 0    Tired, decreased energy 0    Change in appetite 0    Feeling bad or failure about  yourself  0    Trouble concentrating 0    Moving slowly or fidgety/restless 0    Suicidal thoughts 0    PHQ-9 Score 0    Difficult doing work/chores Not difficult at all        09/16/2023   12:42 PM  GAD 7 : Generalized  Anxiety Score  Nervous, Anxious, on Edge 0  Control/stop worrying 0  Worry too much - different things 0  Trouble relaxing 0  Restless 0  Easily annoyed or irritable 0  Afraid - awful might happen 0  Total GAD 7 Score 0  Anxiety Difficulty Not difficult at all     No results found for any visits on 09/16/23.    Assessment & Plan:   Diabetes mellitus without complication (HCC) -     Microalbumin / creatinine urine ratio -     Comprehensive metabolic panel with GFR; Future -     Hemoglobin A1c; Future  Essential hypertension -     Comprehensive metabolic panel with GFR; Future -     T4, free; Future -     TSH; Future -     dilTIAZem  HCl ER Coated Beads; Take 1 capsule (240 mg total) by mouth daily.  Dispense: 90 capsule; Refill: 3  Mixed hyperlipidemia -     Lipid panel; Future  Age-related osteoporosis without current pathological fracture -     VITAMIN D  25 Hydroxy (Vit-D Deficiency, Fractures); Future  Class 1 obesity with serious comorbidity and body mass index (BMI) of 34.0 to 34.9 in adult, unspecified obesity type -     CBC with Differential/Platelet; Future -     Comprehensive metabolic panel with GFR; Future -     Hemoglobin A1c; Future -     Lipid panel; Future -     T4, free; Future -     TSH; Future -     VITAMIN D  25 Hydroxy (Vit-D Deficiency, Fractures); Future  Sarcoidosis  Gastroesophageal reflux disease, unspecified whether esophagitis present -     CBC with Differential/Platelet; Future -     Vitamin B12; Future -     Pantoprazole  Sodium; Take 1 tablet (40 mg total) by mouth daily.  Dispense: 30 tablet; Refill: 0  Other orders -     Ketoconazole ; USE AS DIRECTED TWICE A WEEK AS DIRECTED  Dispense: 120 mL; Refill: 0 -      Fluticasone  Propionate; IINSTILL 2 SPRAYS INTO EACH NOSTRIL AT BEDTIME  Dispense: 48 mL; Refill: 0  DM controlled.  Last A1C 6.2% on 03/04/22. Foot exam done this visit.  Eye exam up to date.  Continue lovastatin  10 mg.   ACEI intolerance due to cough.  Continue lifestyle modifications and metformin  100 mg BID.  Increased physical activity encouraged due to wt gain.  Wt 187 lbs this visit, previously 169 lbs on 01/22/23.  Body mass index is 34.24 kg/m.   Bp elevated.  Recheck.  Continue Diltiazem  240 mg daily and hydrochlorothiazide  12.5 mg daily.  Sarcoidosis stable.    I personally spent a total of 42 minutes in the care of the patient today including getting/reviewing separately obtained history, performing a medically appropriate exam/evaluation, counseling and educating, placing orders, documenting clinical information in the EHR, independently interpreting results, communicating results, and coordinating care.   Return in about 6 months (around 03/18/2024) for chronic conditions.   Clotilda JONELLE Single, MD

## 2023-09-18 ENCOUNTER — Ambulatory Visit: Payer: Medicare Other

## 2023-09-18 VITALS — Ht 62.0 in | Wt 187.0 lb

## 2023-09-18 DIAGNOSIS — Z0000 Encounter for general adult medical examination without abnormal findings: Secondary | ICD-10-CM

## 2023-09-18 NOTE — Progress Notes (Signed)
 Subjective:   Linda Thompson is a 68 y.o. who presents for a Medicare Wellness preventive visit.  As a reminder, Annual Wellness Visits don't include a physical exam, and some assessments may be limited, especially if this visit is performed virtually. We may recommend an in-person follow-up visit with your provider if needed.  Visit Complete: Virtual I connected with  Linda Thompson on 09/18/23 by a audio enabled telemedicine application and verified that I am speaking with the correct person using two identifiers.  Patient Location: Home  Provider Location: Home Office  I discussed the limitations of evaluation and management by telemedicine. The patient expressed understanding and agreed to proceed.  Vital Signs: Because this visit was a virtual/telehealth visit, some criteria may be missing or patient reported. Any vitals not documented were not able to be obtained and vitals that have been documented are patient reported.    Persons Participating in Visit: Patient.  AWV Questionnaire: No: Patient Medicare AWV questionnaire was not completed prior to this visit.  Cardiac Risk Factors include: advanced age (>26men, >36 women);diabetes mellitus     Objective:    Today's Vitals   09/18/23 1407  Weight: 187 lb (84.8 kg)  Height: 5' 2 (1.575 m)   Body mass index is 34.2 kg/m.     09/18/2023    2:14 PM 09/15/2022    2:32 PM 09/11/2021   10:19 AM 11/08/2020    4:51 PM 10/26/2020    1:15 PM 09/07/2020   10:43 AM 09/07/2019   11:28 AM  Advanced Directives  Does Patient Have a Medical Advance Directive? Yes Yes Yes Yes Yes Yes Yes  Type of Estate agent of [place];Living will Healthcare Power of [place];Living will Healthcare Power of [place];Living will Healthcare Power of [place];Living will Healthcare Power of [place];Living will Healthcare Power of [place];Living will Living will  Does patient want to make changes to medical advance directive?  No - Patient declined No - Patient declined No - Patient declined No - Guardian declined   No - Patient declined  Copy of Healthcare Power of Attorney in Chart? Yes - validated most recent copy scanned in chart (See row information) Yes - validated most recent copy scanned in chart (See row information) Yes - validated most recent copy scanned in chart (See row information) Yes - validated most recent copy scanned in chart (See row information) Yes - validated most recent copy scanned in chart (See row information) Yes - validated most recent copy scanned in chart (See row information)     Current Medications (verified) Outpatient Encounter Medications as of 09/18/2023  Medication Sig   acetaminophen  (TYLENOL ) 500 MG tablet Take 500 mg by mouth every 6 (six) hours as needed for mild pain.    B-12 MICROLOZENGE 500 MCG SUBL Place under the tongue.   Besifloxacin HCl (BESIVANCE) 0.6 % SUSP Apply to eye. 4 times each day for 2 days   [facility] B-12 500 MCG tablet TAKE 1 TABLET BY MOUTH DAILY   diclofenac  sodium (VOLTAREN ) 1 % GEL APPLY 2 GRAMS TO AFFECTED AREA 4 TIMES A DAY (Patient taking differently: Apply 2 g topically 4 (four) times daily.)   diltiazem  (CARDIZEM  CD) 240 MG 24 hr capsule Take 1 capsule (240 mg total) by mouth daily.   dorzolamide-timolol (COSOPT) 22.3-6.8 MG/ML ophthalmic solution SMARTSIG:In Eye(s)   ferrous sulfate  325 (65 FE) MG tablet Take 325 mg by mouth 3 (three) times daily with meals.    fluticasone  (FLONASE ) 50 MCG/ACT nasal  spray IINSTILL 2 SPRAYS INTO EACH NOSTRIL AT BEDTIME   hydrochlorothiazide  (MICROZIDE ) 12.5 MG capsule TAKE 1 CAPSULE BY MOUTH EVERY DAY   hydrocortisone  2.5 % ointment APPLY TO AFFECTED AREA TWICE DAILY MONDAY THRU FRIDAY ONLY   ipratropium (ATROVENT ) 0.06 % nasal spray INHALE 1 SPRAY INTO BOTH NOSTRILS 4 TIMES DAILY   ketoconazole  (NIZORAL ) 2 % cream SMARTSIG:1 Application Topical 1 to 2 Times Daily   ketoconazole  (NIZORAL ) 2 % shampoo USE AS DIRECTED  TWICE A WEEK AS DIRECTED   latanoprost (XALATAN) 0.005 % ophthalmic solution Place 1 drop into both eyes at bedtime.    loratadine  (CLARITIN ) 10 MG tablet Take 1 tablet (10 mg total) by mouth daily.   lovastatin  (MEVACOR ) 10 MG tablet TAKE 2 TABLETS BY MOUTH AT BEDTIME.   metFORMIN  (GLUCOPHAGE ) 1000 MG tablet TAKE 1 TABLET (1,000 MG TOTAL) BY MOUTH TWICE A DAY WITH FOOD   mometasone (ELOCON) 0.1 % ointment APPLY A SMALL AMOUNT TO AFFECTED AREA AS DIRECTED   pantoprazole  (PROTONIX ) 40 MG tablet Take 1 tablet (40 mg total) by mouth daily.   Vitamin D , Cholecalciferol, 25 MCG (1000 UT) CAPS Take 1,000 Units by mouth daily.   Vitamins-Lipotropics ([facility] INNER EAR PLUS PO) Take 2 tablets by mouth 2 (two) times daily.    [DISCONTINUED] diltiazem  (TIAZAC ) 240 MG 24 hr capsule Take 1 capsule (240 mg total) by mouth daily.   No facility-administered encounter medications on file as of 09/18/2023.    Allergies (verified) Ace inhibitors, Amoxicillin-pot clavulanate, Augmentin [amoxicillin-pot clavulanate], Azithromycin , Ceftin  [cefuroxime  axetil], Cefuroxime , Cyclobenzaprine  hcl, Flexeril  [cyclobenzaprine ], Flonase  [fluticasone  propionate], and Ibuprofen   History: Past Medical History:  Diagnosis Date   Allergic rhinitis    Allergy    Anemia    Chronic low back pain    DDD (degenerative disc disease), lumbar    GERD (gastroesophageal reflux disease)    w/ hx diaphragmatic hernia   Glaucoma    GLAUCOMA NOS 03/11/2006   Qualifier: Diagnosis of  By: Avelina MD, Amy     Heart murmur    Hx of fracture    R arm, foot   Hyperlipemia    Hypertension    IBS (irritable bowel syndrome)    Low back pain 11/22/2012   Meningioma 12/20/2001   Repeat MR from 2017 - radiology feels not a meningioma, but rather Thickening of the anterior falx appears to be related to degenerative ossification and not meningioma. HCPOA opted not to do repeat imaging with CT for the possible hemangioma and declined further  evaluation of this.   Meningioma (HCC)    ant falx, stable on prior imaging   Moderate intellectual disabilities    Murmur    Obesity    Osteoarthrosis, unspecified whether generalized or localized, ankle and foot    Sarcoidosis    difuse bony lesions and LDA, dx by biopsy in 2016   Transaminitis    Type II or unspecified type diabetes mellitus without mention of complication, not stated as uncontrolled    Past Surgical History:  Procedure Laterality Date   CATARACT EXTRACTION Bilateral    Dr Octavia   COLONOSCOPY  04/15/2011   ELBOW SURGERY     right elbow   LIPOMA EXCISION Left [id number]   posterior left axilla   TOTAL ABDOMINAL HYSTERECTOMY  03/05/1992   leiomyoma and cellular leiomyoma   Family History  Problem Relation Age of Onset   Diabetes Mother    Stroke Mother    Hypertension Mother    Stroke  Father    Diabetes Maternal Aunt    Heart disease Maternal Aunt         (had pacemaker)   Diabetes Maternal Grandmother    Stroke Maternal Grandmother    Hypertension Maternal Grandmother    Colon cancer Neg Hx    Social History   Socioeconomic History   Marital status: Single    Spouse name: Not on file   Number of children: 0   Years of education: 3rd   Highest education level: 3rd grade  Occupational History   Occupation: Disabled (from arm fracture)  Tobacco Use   Smoking status: Never    Passive exposure: Never   Smokeless tobacco: Never  Vaping Use   Vaping status: Never Used  Substance and Sexual Activity   Alcohol use: No    Alcohol/week: 0.0 standard drinks of alcohol   Drug use: No   Sexual activity: Never    Birth control/protection: Surgical    Comment: Hyst--TAH--unsure if has ovaries  Other Topics Concern   Not on file  Social History Narrative         Currently-disability for arm fracture      Mental Retardation, but is able to drive to places she is comfortable with and prepare her own meals and manage most of her own affairs.      Ms.  Flippin (neighbor) is POA/HCPOA      Reports she gets regular exercise and tries to eat healthy   Social Drivers of Health   Financial Resource Strain: Low Risk  (09/18/2023)   Overall Financial Resource Strain (CARDIA)    Difficulty of Paying Living Expenses: Not hard at all  Food Insecurity: No Food Insecurity (09/18/2023)   Hunger Vital Sign    Worried About Running Out of Food in the Last Year: Never true    Ran Out of Food in the Last Year: Never true  Transportation Needs: No Transportation Needs (09/18/2023)   PRAPARE - Administrator, Civil Service (Medical): No    Lack of Transportation (Non-Medical): No  Physical Activity: Sufficiently Active (09/18/2023)   Exercise Vital Sign    Days of Exercise per Week: 7 days    Minutes of Exercise per Session: 30 min  Stress: No Stress Concern Present (09/18/2023)   [employer] of Occupational Health - Occupational Stress Questionnaire    Feeling of Stress: Not at all  Social Connections: Moderately Integrated (09/18/2023)   Social Connection and Isolation Panel    Frequency of Communication with Friends and Family: More than three times a week    Frequency of Social Gatherings with Friends and Family: More than three times a week    Attends Religious Services: More than 4 times per year    Active Member of [employer] or Organizations: Yes    Attends Engineer, structural: More than 4 times per year    Marital Status: Never married    Tobacco Counseling Counseling given: Not Answered    Clinical Intake:  Pre-visit preparation completed: Yes  Pain : No/denies pain     BMI - recorded: 34.2 Nutritional Status: BMI > 30  Obese Nutritional Risks: None Diabetes: Yes CBG done?: No Did pt. bring in CBG monitor from home?: No  Lab Results  Component Value Date   HGBA1C 6.9 (H) 09/16/2023   HGBA1C 6.2 03/04/2022   HGBA1C 6.1 10/05/2020     How often do you need to have someone help you when you read  instructions, pamphlets, or  other written materials from your doctor or pharmacy?: 1 - Never  Interpreter Needed?: No  Information entered by :: Rojelio Blush LPN   Activities of Daily Living      09/18/2023    2:13 PM  In your present state of health, do you have any difficulty performing the following activities:  Hearing? 0  Vision? 0  Difficulty concentrating or making decisions? 0  Walking or climbing stairs? 1  Comment Uses a Walker  Dressing or bathing? 0  Doing errands, shopping? 0  Preparing Food and eating ? N  Using the Toilet? N  In the past six months, have you accidently leaked urine? N  Do you have problems with loss of bowel control? N  Managing your Medications? N  Managing your Finances? N  Housekeeping or managing your Housekeeping? N    Patient Care Team: Mercer Clotilda SAUNDERS, MD as PCP - General (Family Medicine)   I have updated your Care Teams any recent Medical Services you may have received from other providers in the past year.     Assessment:   This is a routine wellness examination for Linda Thompson.  Hearing/Vision screen Hearing Screening - Comments:: Denies hearing difficulties   Vision Screening - Comments:: Wears rx glasses - up to date with routine eye exams with  Dr Octavia   Goals Addressed               This Visit's Progress     Continue physical activity (pt-stated)         Depression Screen      09/18/2023    2:12 PM 09/16/2023   12:42 PM 09/15/2022    2:30 PM 09/11/2021   10:06 AM 11/08/2020    4:49 PM 11/08/2020    3:52 PM 10/26/2020    1:14 PM  PHQ 2/9 Scores  PHQ - 2 Score 0 0 0 0  0 0  PHQ- 9 Score 0 0       Exception Documentation     Medical reason Other- indicate reason in comment box   Not completed      Verified by Berwyn Flatter and Randine Pouch (Healthcare and Financial Power's of [place]).     Fall Risk      09/18/2023    2:13 PM 09/15/2022    2:32 PM 09/11/2021   10:17 AM 06/07/2021   11:41 AM  11/08/2020    4:49 PM  Fall Risk   Falls in the past year? 0 0 0 0 1  Number falls in past yr: 0 0 0 0 0  Injury with Fall? 0 0 0 0 1  Risk for fall due to : No Fall Risks No Fall Risks No Fall Risks Impaired mobility History of fall(s);Impaired balance/gait;Impaired mobility  Follow up Falls evaluation completed Falls prevention discussed  Education provided  Falls evaluation completed;Education provided;Falls prevention discussed      Data saved with a previous flowsheet row definition    MEDICARE RISK AT HOME:   Medicare Risk at Home Any stairs in or around the home?: Yes If so, are there any without handrails?: No Home free of loose throw rugs in walkways, pet beds, electrical cords, etc?: Yes Adequate lighting in your home to reduce risk of falls?: Yes Life alert?: Yes Use of a cane, walker or w/c?: Yes Grab bars in the bathroom?: Yes Shower chair or bench in shower?: Yes Elevated toilet seat or a handicapped toilet?: Yes  TIMED UP AND GO:  Was the test  performed?  No  Cognitive Function: 6CIT completed    12/22/2017    9:17 AM 12/19/2016   10:22 AM 12/11/2015   10:43 AM  MMSE - Mini Mental State Exam  Not completed: -- -- --        09/18/2023    2:14 PM 09/15/2022    2:33 PM 09/11/2021   10:19 AM 09/07/2019   11:39 AM  6CIT Screen  What Year? 0 points 0 points 0 points 0 points  What month? 0 points 0 points 0 points 0 points  What time? 0 points 0 points 0 points 0 points  Count back from 20 0 points 0 points 2 points 2 points  Months in reverse 0 points 0 points 0 points 0 points  Repeat phrase 0 points 0 points 2 points 2 points  Total Score 0 points 0 points 4 points 4 points    Immunizations Immunization History  Administered Date(s) Administered   Fluad Quad(high Dose 65+) 10/05/2020, 10/20/2021   Fluad Trivalent(High Dose 65+) 10/10/2022   Influenza Split 09/19/2010   Influenza Whole 11/12/2004, 10/21/2006, 10/18/2007, 10/19/2008, 10/25/2009    Influenza,inj,Quad PF,6+ Mos 09/17/2012, 09/19/2013, 09/13/2014, 09/06/2016, 09/14/2017, 09/17/2018   Influenza-Unspecified 08/31/2015, 09/06/2016, 09/14/2017   PFIZER(Purple Top)SARS-COV-2 Vaccination 02/21/2019, 03/22/2019   Pfizer(Comirnaty)Fall Seasonal Vaccine 12 years and older 12/03/2021   Pneumococcal Conjugate-13 09/14/2019   Pneumococcal Polysaccharide-23 03/10/2002, 02/08/2009   Td 03/10/2002, 08/08/2014   Zoster Recombinant(Shingrix) 09/22/2017, 12/21/2017   Zoster, Live 08/08/2014    Screening Tests Health Maintenance  Topic Date Due   MAMMOGRAM  10/25/2021   INFLUENZA VACCINE  09/20/2023 (Originally 08/21/2023)   COVID-19 Vaccine (4 - 2024-25 season) 10/01/2023 (Originally 09/21/2022)   HEMOGLOBIN A1C  03/18/2024   Colonoscopy  07/12/2024   DTaP/Tdap/Td (3 - Tdap) 08/07/2024   OPHTHALMOLOGY EXAM  09/02/2024   Pneumococcal Vaccine: 50+ Years (3 of 3 - PCV20 or PCV21) 09/13/2024   Diabetic kidney evaluation - eGFR measurement  09/15/2024   Diabetic kidney evaluation - Urine ACR  09/15/2024   FOOT EXAM  09/15/2024   Medicare Annual Wellness (AWV)  09/17/2024   DEXA SCAN  Completed   Hepatitis C Screening  Completed   Zoster Vaccines- Shingrix  Completed   HPV VACCINES  Aged Out   Meningococcal B Vaccine  Aged Out    Health Maintenance  Health Maintenance Due  Topic Date Due   MAMMOGRAM  10/25/2021   Health Maintenance Items Addressed:   Additional Screening:  Vision Screening: Recommended annual ophthalmology exams for early detection of glaucoma and other disorders of the eye. Would you like a referral to an eye doctor? No    Dental Screening: Recommended annual dental exams for proper oral hygiene  Community Resource Referral / Chronic Care Management: CRR required this visit?  No   CCM required this visit?  No    Plan:    I have personally reviewed and noted the following in the patient's chart:   Medical and social history Use of alcohol,  tobacco or illicit drugs  Current medications and supplements including opioid prescriptions. Patient is not currently taking opioid prescriptions. Functional ability and status Nutritional status Physical activity Advanced directives List of other physicians Hospitalizations, surgeries, and ER visits in previous 12 months Vitals Screenings to include cognitive, depression, and falls Referrals and appointments  In addition, I have reviewed and discussed with patient certain preventive protocols, quality metrics, and best practice recommendations. A written personalized care plan for preventive services as well as general preventive  health recommendations were provided to patient.   Rojelio LELON Blush, LPN   1/[id number]/7974   After Visit Summary: (MyChart) Due to this being a telephonic visit, the after visit summary with patients personalized plan was offered to patient via MyChart   Notes: Nothing significant to report at this time.

## 2023-09-18 NOTE — Patient Instructions (Addendum)
 Ms. Astacio , Thank you for taking time out of your busy schedule to complete your Annual Wellness Visit with me. I enjoyed our conversation and look forward to speaking with you again next year. I, as well as your care team,  appreciate your ongoing commitment to your health goals. Please review the following plan we discussed and let me know if I can assist you in the future. Your Game plan/ To Do List    Referrals: If you haven't heard from the office you've been referred to, please reach out to them at the phone provided.   Follow up Visits: We will see or speak with you next year for your Next Medicare AWV with our clinical staff 09/23/24 @ 2:20p  Have you seen your provider in the last 6 months (3 months if uncontrolled diabetes)?  Next appointment with provider 03/18/24 @ 9a  Clinician Recommendations:  Aim for 30 minutes of exercise or brisk walking, 6-8 glasses of water, and 5 servings of fruits and vegetables each day.       This is a list of the screenings recommended for you:  Health Maintenance  Topic Date Due   Mammogram  10/25/2021   Flu Shot  09/20/2023*   COVID-19 Vaccine (4 - 2024-25 season) 10/01/2023*   Hemoglobin A1C  03/18/2024   Colon Cancer Screening  07/12/2024   DTaP/Tdap/Td vaccine (3 - Tdap) 08/07/2024   Eye exam for diabetics  09/02/2024   Pneumococcal Vaccine for age over 44 (3 of 3 - PCV20 or PCV21) 09/13/2024   Yearly kidney function blood test for diabetes  09/15/2024   Yearly kidney health urinalysis for diabetes  09/15/2024   Complete foot exam   09/15/2024   Medicare Annual Wellness Visit  09/17/2024   DEXA scan (bone density measurement)  Completed   Hepatitis C Screening  Completed   Zoster (Shingles) Vaccine  Completed   HPV Vaccine  Aged Out   Meningitis B Vaccine  Aged Out  *Topic was postponed. The date shown is not the original due date.    Advanced directives: (In Chart) A copy of your advanced directives are scanned into your chart should  your provider ever need it. Advance Care Planning is important because it:  [x]  Makes sure you receive the medical care that is consistent with your values, goals, and preferences  [x]  It provides guidance to your family and loved ones and reduces their decisional burden about whether or not they are making the right decisions based on your wishes.  Follow the link provided in your after visit summary or read over the paperwork we have mailed to you to help you started getting your Advance Directives in place. If you need assistance in completing these, please reach out to us  so that we can help you!  See attachments for Preventive Care and Fall Prevention Tips.

## 2023-09-22 ENCOUNTER — Ambulatory Visit (INDEPENDENT_AMBULATORY_CARE_PROVIDER_SITE_OTHER): Admitting: Podiatry

## 2023-09-22 ENCOUNTER — Encounter: Payer: Self-pay | Admitting: Podiatry

## 2023-09-22 DIAGNOSIS — B351 Tinea unguium: Secondary | ICD-10-CM

## 2023-09-22 DIAGNOSIS — M79674 Pain in right toe(s): Secondary | ICD-10-CM

## 2023-09-22 DIAGNOSIS — E119 Type 2 diabetes mellitus without complications: Secondary | ICD-10-CM

## 2023-09-22 DIAGNOSIS — M79675 Pain in left toe(s): Secondary | ICD-10-CM | POA: Diagnosis not present

## 2023-09-22 NOTE — Progress Notes (Signed)
This patient returns to my office for at risk foot care.  This patient requires this care by a professional since this patient will be at risk due to having type 2 diabetes.  This patient is unable to cut nails herself since the patient cannot reach her nails.These nails are painful walking and wearing shoes.  This patient presents for at risk foot care today.  General Appearance  Alert, conversant and in no acute stress.  Vascular  Dorsalis pedis and posterior tibial  pulses are palpable  bilaterally.  Capillary return is within normal limits  bilaterally. Temperature is within normal limits  bilaterally.  Neurologic  Senn-Weinstein monofilament wire test within normal limits  bilaterally. Muscle power within normal limits bilaterally.  Nails Thick disfigured discolored nails with subungual debris  from hallux to fifth toes bilaterally. No evidence of bacterial infection or drainage bilaterally.  Orthopedic  No limitations of motion  feet .  No crepitus or effusions noted.  HAV  B/L.  Hammer toes  B/L.  Skin  normotropic skin with no porokeratosis noted bilaterally.  No signs of infections or ulcers noted.     Onychomycosis  Pain in right toes  Pain in left toes  Consent was obtained for treatment procedures.   Mechanical debridement of nails 1-5  bilaterally performed with a nail nipper.  Filed with dremel without incident.    Return office visit  4   months                   Told patient to return for periodic foot care and evaluation due to potential at risk complications.    Rome Echavarria DPM  

## 2023-10-08 ENCOUNTER — Encounter (INDEPENDENT_AMBULATORY_CARE_PROVIDER_SITE_OTHER): Admitting: Ophthalmology

## 2023-10-08 DIAGNOSIS — H43813 Vitreous degeneration, bilateral: Secondary | ICD-10-CM | POA: Diagnosis not present

## 2023-10-08 DIAGNOSIS — I10 Essential (primary) hypertension: Secondary | ICD-10-CM | POA: Diagnosis not present

## 2023-10-08 DIAGNOSIS — H33302 Unspecified retinal break, left eye: Secondary | ICD-10-CM | POA: Diagnosis not present

## 2023-10-08 DIAGNOSIS — H34832 Tributary (branch) retinal vein occlusion, left eye, with macular edema: Secondary | ICD-10-CM

## 2023-10-08 DIAGNOSIS — H35033 Hypertensive retinopathy, bilateral: Secondary | ICD-10-CM

## 2023-10-19 ENCOUNTER — Ambulatory Visit: Payer: Self-pay | Admitting: Family Medicine

## 2023-10-19 DIAGNOSIS — N898 Other specified noninflammatory disorders of vagina: Secondary | ICD-10-CM | POA: Diagnosis not present

## 2023-10-19 DIAGNOSIS — L290 Pruritus ani: Secondary | ICD-10-CM | POA: Diagnosis not present

## 2023-10-21 DIAGNOSIS — Z23 Encounter for immunization: Secondary | ICD-10-CM | POA: Diagnosis not present

## 2023-10-28 ENCOUNTER — Telehealth: Payer: Self-pay

## 2023-10-28 DIAGNOSIS — E559 Vitamin D deficiency, unspecified: Secondary | ICD-10-CM

## 2023-10-28 NOTE — Telephone Encounter (Signed)
 Copied from CRM [id number]. Topic: Clinical - Medication Question >> Oct 27, 2023 11:47 AM Revonda D wrote: Reason for CRM: Val is calling to get an update on the Vitamin D  medication that was sent to the pharmacy for the pt. Val was informed by the pharmacy that they have not received a medication requested for the pt. Val would like for the Ergocalciferol  50,000 IU to be sent to the pharmacy today if possible and would like a callback with an update.CB [phone].

## 2023-10-29 MED ORDER — VITAMIN D (ERGOCALCIFEROL) 1.25 MG (50000 UNIT) PO CAPS: 50000.0000 [IU] | ORAL_CAPSULE | ORAL | 0 refills | Status: AC

## 2023-10-29 NOTE — Addendum Note (Signed)
 Addended by: BRIEN SONG A on: 10/29/2023 04:58 PM   Modules accepted: Orders

## 2023-11-05 ENCOUNTER — Telehealth: Payer: Self-pay | Admitting: *Deleted

## 2023-11-05 NOTE — Telephone Encounter (Signed)
 Copied from CRM [id number]. Topic: Clinical - Medication Question >> Nov 05, 2023 12:26 PM Anairis L wrote: Reason for CRM: Patient wants clarification on how to take her vitamin D .   Please call.

## 2023-11-06 ENCOUNTER — Ambulatory Visit: Payer: Self-pay

## 2023-11-06 NOTE — Telephone Encounter (Signed)
 FYI Only or Action Required?: FYI only for provider.  Patient was last seen in primary care on 09/16/2023 by Mercer Clotilda SAUNDERS, MD.  Called Nurse Triage reporting Medication Question.  Symptoms began n/a.  Interventions attempted: Other: n/a.  Symptoms are: n/a.  Triage Disposition: Information or Advice Only Call  Patient/caregiver understands and will follow disposition?: Yes Reason for Disposition  Health information question, no triage required and triager able to answer question  Answer Assessment - Initial Assessment Questions Educated patient Vitamin D  is supposed to be taken once a week, last taken 10/16, advised patient to no longer take any Vitamin D  this week and resume on 10/23 and take once a week. Patient denied any symptoms.  1. REASON FOR CALL: What is the main reason for your call? or How can I best help you?     Patient took Vitamin D  everyday this week instead of once a week, looking for clarification on how to take it  2. SYMPTOMS : Do you have any symptoms?      Doing ok, bowels moving real good denies other symptoms  Protocols used: Information Only Call - No Triage-A-AH  Copied from CRM [id number]. Topic: Clinical - Red Word Triage >> Nov 06, 2023 11:06 AM Linda Thompson wrote: [facility] that prompted transfer to Nurse Triage: Patient is calling because she took her vitamin D  mon, tue, wed and thu, but she was only suppose to take one once a week.  Please advise.

## 2023-11-06 NOTE — Telephone Encounter (Signed)
 Called patient to inform her that she is to take the vitamin d  weekly for 12 weeks. Patient informed me that she took 1 tablet starting on Monday of this week through Thursday and was told by nurse triage to restart on 11/12/23. I asked patient if I could speak about this to Dr. Mercer. Patient was placed on hold spoke with Dr. Mercer about the matter and was advised to tell patient not to take the vitamin d  until 12/07/23 and if it is to much for her to just stop altogether. Informed patient of Dr. Mercer comments/recommendations and patient kept saying that the nurse this morning told her to take it on 10/23. I advised patient that per Dr. Mercer she is to restart the vitamin d  on 12/07/23 which is a Monday and to take only on Monday's until finished. Dr. Mercer overheard my trying to get patient to understand to start back on 12/07/23 that she spoke to patient.

## 2023-11-12 ENCOUNTER — Encounter (INDEPENDENT_AMBULATORY_CARE_PROVIDER_SITE_OTHER): Admitting: Ophthalmology

## 2023-11-12 DIAGNOSIS — H34832 Tributary (branch) retinal vein occlusion, left eye, with macular edema: Secondary | ICD-10-CM | POA: Diagnosis not present

## 2023-11-12 DIAGNOSIS — H43813 Vitreous degeneration, bilateral: Secondary | ICD-10-CM | POA: Diagnosis not present

## 2023-11-12 DIAGNOSIS — I10 Essential (primary) hypertension: Secondary | ICD-10-CM | POA: Diagnosis not present

## 2023-11-12 DIAGNOSIS — H33302 Unspecified retinal break, left eye: Secondary | ICD-10-CM

## 2023-11-12 DIAGNOSIS — H35033 Hypertensive retinopathy, bilateral: Secondary | ICD-10-CM

## 2023-12-02 ENCOUNTER — Other Ambulatory Visit: Payer: Self-pay | Admitting: Family Medicine

## 2023-12-02 DIAGNOSIS — K219 Gastro-esophageal reflux disease without esophagitis: Secondary | ICD-10-CM

## 2023-12-10 ENCOUNTER — Encounter (INDEPENDENT_AMBULATORY_CARE_PROVIDER_SITE_OTHER): Admitting: Ophthalmology

## 2023-12-10 DIAGNOSIS — H34832 Tributary (branch) retinal vein occlusion, left eye, with macular edema: Secondary | ICD-10-CM | POA: Diagnosis not present

## 2023-12-10 DIAGNOSIS — H35033 Hypertensive retinopathy, bilateral: Secondary | ICD-10-CM | POA: Diagnosis not present

## 2023-12-10 DIAGNOSIS — I10 Essential (primary) hypertension: Secondary | ICD-10-CM | POA: Diagnosis not present

## 2023-12-10 DIAGNOSIS — H43813 Vitreous degeneration, bilateral: Secondary | ICD-10-CM

## 2023-12-10 DIAGNOSIS — H33302 Unspecified retinal break, left eye: Secondary | ICD-10-CM | POA: Diagnosis not present

## 2024-01-07 ENCOUNTER — Encounter (INDEPENDENT_AMBULATORY_CARE_PROVIDER_SITE_OTHER): Admitting: Ophthalmology

## 2024-01-07 DIAGNOSIS — I10 Essential (primary) hypertension: Secondary | ICD-10-CM | POA: Diagnosis not present

## 2024-01-07 DIAGNOSIS — H43813 Vitreous degeneration, bilateral: Secondary | ICD-10-CM | POA: Diagnosis not present

## 2024-01-07 DIAGNOSIS — H35033 Hypertensive retinopathy, bilateral: Secondary | ICD-10-CM

## 2024-01-07 DIAGNOSIS — H34832 Tributary (branch) retinal vein occlusion, left eye, with macular edema: Secondary | ICD-10-CM | POA: Diagnosis not present

## 2024-01-18 ENCOUNTER — Encounter: Payer: Self-pay | Admitting: Podiatry

## 2024-01-18 ENCOUNTER — Ambulatory Visit: Admitting: Podiatry

## 2024-01-18 VITALS — Ht 62.0 in | Wt 187.0 lb

## 2024-01-18 DIAGNOSIS — M79674 Pain in right toe(s): Secondary | ICD-10-CM | POA: Diagnosis not present

## 2024-01-18 DIAGNOSIS — M79675 Pain in left toe(s): Secondary | ICD-10-CM

## 2024-01-18 DIAGNOSIS — B351 Tinea unguium: Secondary | ICD-10-CM

## 2024-01-18 DIAGNOSIS — E119 Type 2 diabetes mellitus without complications: Secondary | ICD-10-CM

## 2024-01-18 NOTE — Progress Notes (Signed)
This patient returns to my office for at risk foot care.  This patient requires this care by a professional since this patient will be at risk due to having type 2 diabetes.  This patient is unable to cut nails herself since the patient cannot reach her nails.These nails are painful walking and wearing shoes.  This patient presents for at risk foot care today.  General Appearance  Alert, conversant and in no acute stress.  Vascular  Dorsalis pedis and posterior tibial  pulses are palpable  bilaterally.  Capillary return is within normal limits  bilaterally. Temperature is within normal limits  bilaterally.  Neurologic  Senn-Weinstein monofilament wire test within normal limits  bilaterally. Muscle power within normal limits bilaterally.  Nails Thick disfigured discolored nails with subungual debris  from hallux to fifth toes bilaterally. No evidence of bacterial infection or drainage bilaterally.  Orthopedic  No limitations of motion  feet .  No crepitus or effusions noted.  HAV  B/L.  Hammer toes  B/L.  Skin  normotropic skin with no porokeratosis noted bilaterally.  No signs of infections or ulcers noted.     Onychomycosis  Pain in right toes  Pain in left toes  Consent was obtained for treatment procedures.   Mechanical debridement of nails 1-5  bilaterally performed with a nail nipper.  Filed with dremel without incident.    Return office visit  4   months                   Told patient to return for periodic foot care and evaluation due to potential at risk complications.    Rome Echavarria DPM  

## 2024-02-04 ENCOUNTER — Encounter (INDEPENDENT_AMBULATORY_CARE_PROVIDER_SITE_OTHER): Admitting: Ophthalmology

## 2024-02-04 DIAGNOSIS — H43813 Vitreous degeneration, bilateral: Secondary | ICD-10-CM | POA: Diagnosis not present

## 2024-02-04 DIAGNOSIS — I10 Essential (primary) hypertension: Secondary | ICD-10-CM | POA: Diagnosis not present

## 2024-02-04 DIAGNOSIS — H33302 Unspecified retinal break, left eye: Secondary | ICD-10-CM | POA: Diagnosis not present

## 2024-02-04 DIAGNOSIS — H35033 Hypertensive retinopathy, bilateral: Secondary | ICD-10-CM | POA: Diagnosis not present

## 2024-02-04 DIAGNOSIS — H34832 Tributary (branch) retinal vein occlusion, left eye, with macular edema: Secondary | ICD-10-CM

## 2024-03-04 ENCOUNTER — Encounter (INDEPENDENT_AMBULATORY_CARE_PROVIDER_SITE_OTHER): Admitting: Ophthalmology

## 2024-03-18 ENCOUNTER — Ambulatory Visit: Admitting: Family Medicine

## 2024-05-18 ENCOUNTER — Ambulatory Visit: Admitting: Podiatry

## 2024-09-23 ENCOUNTER — Ambulatory Visit
